# Patient Record
Sex: Female | Born: 1955 | Race: White | Hispanic: No | Marital: Married | Smoking: Former smoker
Health system: Southern US, Community
[De-identification: ages and names within clinical notes are randomized; demographics above are authoritative.]

## PROBLEM LIST (undated history)

## (undated) DIAGNOSIS — E785 Hyperlipidemia, unspecified: Secondary | ICD-10-CM

## (undated) DIAGNOSIS — G2581 Restless legs syndrome: Secondary | ICD-10-CM

## (undated) DIAGNOSIS — E039 Hypothyroidism, unspecified: Secondary | ICD-10-CM

## (undated) DIAGNOSIS — G629 Polyneuropathy, unspecified: Secondary | ICD-10-CM

## (undated) DIAGNOSIS — R0600 Dyspnea, unspecified: Secondary | ICD-10-CM

## (undated) DIAGNOSIS — I509 Heart failure, unspecified: Secondary | ICD-10-CM

## (undated) DIAGNOSIS — K219 Gastro-esophageal reflux disease without esophagitis: Secondary | ICD-10-CM

## (undated) DIAGNOSIS — I252 Old myocardial infarction: Secondary | ICD-10-CM

## (undated) DIAGNOSIS — T8859XA Other complications of anesthesia, initial encounter: Secondary | ICD-10-CM

## (undated) DIAGNOSIS — J3800 Paralysis of vocal cords and larynx, unspecified: Secondary | ICD-10-CM

## (undated) DIAGNOSIS — Z9981 Dependence on supplemental oxygen: Secondary | ICD-10-CM

## (undated) DIAGNOSIS — L0390 Cellulitis, unspecified: Secondary | ICD-10-CM

## (undated) DIAGNOSIS — Z8719 Personal history of other diseases of the digestive system: Secondary | ICD-10-CM

## (undated) DIAGNOSIS — E1140 Type 2 diabetes mellitus with diabetic neuropathy, unspecified: Secondary | ICD-10-CM

## (undated) DIAGNOSIS — Z992 Dependence on renal dialysis: Secondary | ICD-10-CM

## (undated) DIAGNOSIS — T4145XA Adverse effect of unspecified anesthetic, initial encounter: Secondary | ICD-10-CM

## (undated) DIAGNOSIS — N8500 Endometrial hyperplasia, unspecified: Secondary | ICD-10-CM

## (undated) DIAGNOSIS — Z8489 Family history of other specified conditions: Secondary | ICD-10-CM

## (undated) DIAGNOSIS — E119 Type 2 diabetes mellitus without complications: Secondary | ICD-10-CM

## (undated) DIAGNOSIS — M1990 Unspecified osteoarthritis, unspecified site: Secondary | ICD-10-CM

## (undated) DIAGNOSIS — F329 Major depressive disorder, single episode, unspecified: Secondary | ICD-10-CM

## (undated) DIAGNOSIS — I639 Cerebral infarction, unspecified: Secondary | ICD-10-CM

## (undated) DIAGNOSIS — I2510 Atherosclerotic heart disease of native coronary artery without angina pectoris: Secondary | ICD-10-CM

## (undated) DIAGNOSIS — J449 Chronic obstructive pulmonary disease, unspecified: Secondary | ICD-10-CM

## (undated) DIAGNOSIS — J4489 Other specified chronic obstructive pulmonary disease: Secondary | ICD-10-CM

## (undated) DIAGNOSIS — J42 Unspecified chronic bronchitis: Secondary | ICD-10-CM

## (undated) DIAGNOSIS — F419 Anxiety disorder, unspecified: Secondary | ICD-10-CM

## (undated) DIAGNOSIS — J45909 Unspecified asthma, uncomplicated: Secondary | ICD-10-CM

## (undated) DIAGNOSIS — I739 Peripheral vascular disease, unspecified: Secondary | ICD-10-CM

## (undated) DIAGNOSIS — F32A Depression, unspecified: Secondary | ICD-10-CM

## (undated) DIAGNOSIS — N186 End stage renal disease: Secondary | ICD-10-CM

## (undated) DIAGNOSIS — D649 Anemia, unspecified: Secondary | ICD-10-CM

## (undated) DIAGNOSIS — J189 Pneumonia, unspecified organism: Secondary | ICD-10-CM

## (undated) DIAGNOSIS — H3520 Other non-diabetic proliferative retinopathy, unspecified eye: Secondary | ICD-10-CM

## (undated) DIAGNOSIS — I10 Essential (primary) hypertension: Secondary | ICD-10-CM

## (undated) HISTORY — DX: Peripheral vascular disease, unspecified: I73.9

## (undated) HISTORY — DX: Other specified chronic obstructive pulmonary disease: J44.89

## (undated) HISTORY — PX: VITRECTOMY: SHX106

## (undated) HISTORY — DX: Essential (primary) hypertension: I10

## (undated) HISTORY — PX: INTRAUTERINE DEVICE INSERTION: SHX323

## (undated) HISTORY — PX: CERVICAL BIOPSY: SHX590

## (undated) HISTORY — DX: Chronic obstructive pulmonary disease, unspecified: J44.9

## (undated) HISTORY — PX: COLONOSCOPY: SHX174

## (undated) HISTORY — DX: Atherosclerotic heart disease of native coronary artery without angina pectoris: I25.10

## (undated) HISTORY — DX: Hyperlipidemia, unspecified: E78.5

## (undated) HISTORY — PX: EYE SURGERY: SHX253

## (undated) HISTORY — PX: CATARACT EXTRACTION W/ INTRAOCULAR LENS  IMPLANT, BILATERAL: SHX1307

## (undated) HISTORY — DX: Endometrial hyperplasia, unspecified: N85.00

## (undated) HISTORY — PX: CAROTID ENDARTERECTOMY: SUR193

---

## 1999-03-20 ENCOUNTER — Emergency Department (HOSPITAL_COMMUNITY): Admission: EM | Admit: 1999-03-20 | Discharge: 1999-03-20 | Payer: Self-pay | Admitting: Emergency Medicine

## 1999-09-07 ENCOUNTER — Emergency Department (HOSPITAL_COMMUNITY): Admission: EM | Admit: 1999-09-07 | Discharge: 1999-09-07 | Payer: Self-pay | Admitting: Emergency Medicine

## 1999-09-07 ENCOUNTER — Encounter: Payer: Self-pay | Admitting: Emergency Medicine

## 2000-02-24 ENCOUNTER — Encounter: Admission: RE | Admit: 2000-02-24 | Discharge: 2000-02-24 | Payer: Self-pay | Admitting: Family Medicine

## 2000-02-24 ENCOUNTER — Encounter: Payer: Self-pay | Admitting: Family Medicine

## 2001-05-17 ENCOUNTER — Other Ambulatory Visit: Admission: RE | Admit: 2001-05-17 | Discharge: 2001-05-17 | Payer: Self-pay | Admitting: Family Medicine

## 2001-05-19 ENCOUNTER — Encounter: Payer: Self-pay | Admitting: Family Medicine

## 2001-05-19 ENCOUNTER — Encounter: Admission: RE | Admit: 2001-05-19 | Discharge: 2001-05-19 | Payer: Self-pay | Admitting: Family Medicine

## 2001-05-20 ENCOUNTER — Encounter: Admission: RE | Admit: 2001-05-20 | Discharge: 2001-05-20 | Payer: Self-pay | Admitting: Family Medicine

## 2001-05-20 ENCOUNTER — Encounter: Payer: Self-pay | Admitting: Family Medicine

## 2001-06-17 ENCOUNTER — Encounter: Payer: Self-pay | Admitting: Vascular Surgery

## 2001-06-19 ENCOUNTER — Encounter (INDEPENDENT_AMBULATORY_CARE_PROVIDER_SITE_OTHER): Payer: Self-pay | Admitting: *Deleted

## 2001-06-19 ENCOUNTER — Inpatient Hospital Stay (HOSPITAL_COMMUNITY): Admission: RE | Admit: 2001-06-19 | Discharge: 2001-06-20 | Payer: Self-pay | Admitting: Vascular Surgery

## 2001-11-22 ENCOUNTER — Ambulatory Visit (HOSPITAL_COMMUNITY): Admission: RE | Admit: 2001-11-22 | Discharge: 2001-11-23 | Payer: Self-pay | Admitting: Ophthalmology

## 2001-11-22 ENCOUNTER — Encounter: Payer: Self-pay | Admitting: Ophthalmology

## 2002-06-05 ENCOUNTER — Other Ambulatory Visit: Admission: RE | Admit: 2002-06-05 | Discharge: 2002-06-05 | Payer: Self-pay | Admitting: Family Medicine

## 2003-07-10 ENCOUNTER — Other Ambulatory Visit: Admission: RE | Admit: 2003-07-10 | Discharge: 2003-07-10 | Payer: Self-pay | Admitting: Family Medicine

## 2005-04-06 ENCOUNTER — Other Ambulatory Visit: Admission: RE | Admit: 2005-04-06 | Discharge: 2005-04-06 | Payer: Self-pay | Admitting: Family Medicine

## 2005-05-01 ENCOUNTER — Encounter: Admission: RE | Admit: 2005-05-01 | Discharge: 2005-05-01 | Payer: Self-pay | Admitting: Family Medicine

## 2005-05-19 ENCOUNTER — Encounter: Admission: RE | Admit: 2005-05-19 | Discharge: 2005-05-19 | Payer: Self-pay | Admitting: Family Medicine

## 2005-12-29 ENCOUNTER — Encounter: Admission: RE | Admit: 2005-12-29 | Discharge: 2005-12-29 | Payer: Self-pay | Admitting: Family Medicine

## 2006-07-02 ENCOUNTER — Encounter: Admission: RE | Admit: 2006-07-02 | Discharge: 2006-07-02 | Payer: Self-pay | Admitting: Family Medicine

## 2006-07-09 ENCOUNTER — Other Ambulatory Visit: Admission: RE | Admit: 2006-07-09 | Discharge: 2006-07-09 | Payer: Self-pay | Admitting: Family Medicine

## 2006-08-13 ENCOUNTER — Encounter: Admission: RE | Admit: 2006-08-13 | Discharge: 2006-08-13 | Payer: Self-pay | Admitting: Family Medicine

## 2006-11-16 ENCOUNTER — Inpatient Hospital Stay (HOSPITAL_COMMUNITY): Admission: AD | Admit: 2006-11-16 | Discharge: 2006-11-24 | Payer: Self-pay | Admitting: General Surgery

## 2006-11-22 ENCOUNTER — Ambulatory Visit: Payer: Self-pay | Admitting: Vascular Surgery

## 2006-11-22 ENCOUNTER — Encounter: Payer: Self-pay | Admitting: Vascular Surgery

## 2007-11-15 ENCOUNTER — Other Ambulatory Visit: Admission: RE | Admit: 2007-11-15 | Discharge: 2007-11-15 | Payer: Self-pay | Admitting: Family Medicine

## 2009-03-09 ENCOUNTER — Encounter: Admission: RE | Admit: 2009-03-09 | Discharge: 2009-03-09 | Payer: Self-pay | Admitting: Otolaryngology

## 2009-12-14 ENCOUNTER — Encounter: Admission: RE | Admit: 2009-12-14 | Discharge: 2009-12-14 | Payer: Self-pay | Admitting: Geriatric Medicine

## 2010-01-18 ENCOUNTER — Other Ambulatory Visit: Admission: RE | Admit: 2010-01-18 | Discharge: 2010-01-18 | Payer: Self-pay | Admitting: Obstetrics and Gynecology

## 2010-01-27 ENCOUNTER — Ambulatory Visit (HOSPITAL_COMMUNITY): Admission: RE | Admit: 2010-01-27 | Discharge: 2010-01-27 | Payer: Self-pay | Admitting: Gastroenterology

## 2010-03-29 ENCOUNTER — Ambulatory Visit: Payer: Self-pay | Admitting: Vascular Surgery

## 2010-04-19 ENCOUNTER — Ambulatory Visit: Payer: Self-pay | Admitting: Surgery

## 2010-04-19 ENCOUNTER — Ambulatory Visit (HOSPITAL_COMMUNITY): Admission: RE | Admit: 2010-04-19 | Discharge: 2010-04-19 | Payer: Self-pay | Admitting: Surgery

## 2010-04-26 ENCOUNTER — Ambulatory Visit: Payer: Self-pay | Admitting: Vascular Surgery

## 2010-05-05 ENCOUNTER — Ambulatory Visit: Payer: Self-pay | Admitting: Internal Medicine

## 2010-05-05 ENCOUNTER — Encounter: Payer: Self-pay | Admitting: Vascular Surgery

## 2010-05-05 ENCOUNTER — Ambulatory Visit: Payer: Self-pay | Admitting: Cardiology

## 2010-05-05 ENCOUNTER — Inpatient Hospital Stay (HOSPITAL_COMMUNITY): Admission: RE | Admit: 2010-05-05 | Discharge: 2010-05-14 | Payer: Self-pay | Admitting: Vascular Surgery

## 2010-05-05 ENCOUNTER — Encounter (INDEPENDENT_AMBULATORY_CARE_PROVIDER_SITE_OTHER): Payer: Self-pay | Admitting: Internal Medicine

## 2010-05-24 ENCOUNTER — Ambulatory Visit: Payer: Self-pay | Admitting: Vascular Surgery

## 2010-05-31 ENCOUNTER — Ambulatory Visit: Payer: Self-pay | Admitting: Vascular Surgery

## 2010-05-31 DIAGNOSIS — J449 Chronic obstructive pulmonary disease, unspecified: Secondary | ICD-10-CM | POA: Insufficient documentation

## 2010-05-31 DIAGNOSIS — E1169 Type 2 diabetes mellitus with other specified complication: Secondary | ICD-10-CM

## 2010-05-31 DIAGNOSIS — I119 Hypertensive heart disease without heart failure: Secondary | ICD-10-CM

## 2010-05-31 DIAGNOSIS — E785 Hyperlipidemia, unspecified: Secondary | ICD-10-CM

## 2010-05-31 DIAGNOSIS — E1129 Type 2 diabetes mellitus with other diabetic kidney complication: Secondary | ICD-10-CM

## 2010-06-01 ENCOUNTER — Ambulatory Visit: Payer: Self-pay | Admitting: Cardiology

## 2010-06-01 DIAGNOSIS — I2510 Atherosclerotic heart disease of native coronary artery without angina pectoris: Secondary | ICD-10-CM

## 2010-08-03 ENCOUNTER — Ambulatory Visit: Payer: Self-pay | Admitting: Cardiology

## 2010-08-18 ENCOUNTER — Ambulatory Visit (HOSPITAL_COMMUNITY)
Admission: RE | Admit: 2010-08-18 | Discharge: 2010-08-18 | Payer: Self-pay | Source: Home / Self Care | Attending: Cardiology | Admitting: Cardiology

## 2010-08-18 ENCOUNTER — Ambulatory Visit: Payer: Self-pay

## 2010-08-18 ENCOUNTER — Encounter: Payer: Self-pay | Admitting: Cardiology

## 2010-08-22 ENCOUNTER — Emergency Department (HOSPITAL_COMMUNITY)
Admission: EM | Admit: 2010-08-22 | Discharge: 2010-08-22 | Payer: Self-pay | Source: Home / Self Care | Admitting: Emergency Medicine

## 2010-09-06 ENCOUNTER — Ambulatory Visit: Payer: Self-pay | Admitting: Vascular Surgery

## 2010-09-15 ENCOUNTER — Ambulatory Visit
Admission: RE | Admit: 2010-09-15 | Discharge: 2010-09-15 | Payer: Self-pay | Source: Home / Self Care | Attending: Cardiology | Admitting: Cardiology

## 2010-09-15 ENCOUNTER — Encounter: Payer: Self-pay | Admitting: Cardiology

## 2010-10-02 ENCOUNTER — Encounter: Payer: Self-pay | Admitting: Family Medicine

## 2010-10-11 NOTE — Assessment & Plan Note (Signed)
Summary: eph/diastolic chf/lg   CC:  fatigue.  History of Present Illness: This is a 55 year old white female patient who underwent left carotid endarterectomy May 05, 2010. Postop she developed breathing difficulty and was intubated and found to have positive cardiac enzymes and an EKG suggestive of a non-ST elevation MI. She underwent cardiac catheterization May 10, 2010 that showed a hyperdynamic LV function, she had dominant circumflex anatomy with a 70-80% small OM1. She had diffuse diabetic plaque particularly in the distal LAD. She nondominant RCA. Medical therapy was recommended at this time she was anemic and smoking cessation was recommended as well.  The patient denies any further chest pain, tightness, dyspnea, dyspnea on exertion, dizziness or presyncope. She has had 2 episodes of fluttering that lasted only a second and had no other associated symptoms. She states she may need a right carotid endarterectomy in near future. She also developed cellulitis and leg edema since discharge and was seen by her primary care physician. They added Lasix and an antibiotic which has helped.She has quit smoking.  Current Medications (verified): 1)  Bactroban 2 % Oint (Mupirocin) .... As Directed 2)  Furosemide 20 Mg Tabs (Furosemide) .... Take One Tablet By Mouth Daily. 3)  Doxycycline Hyclate 100 Mg Caps (Doxycycline Hyclate) .Marland Kitchen.. 1  Tab By Mouth Two Times A Day 4)  Benicar 20 Mg Tabs (Olmesartan Medoxomil) .... Take One Tablet By Mouth Daily 5)  Metoprolol Tartrate 25 Mg Tabs (Metoprolol Tartrate) .... Take One Tablet By Mouth Twice A Day 6)  Co Q-10 100 Mg Caps (Coenzyme Q10) .Marland Kitchen.. 1 Tab By Mouth Once Daily 7)  Apidra 100 Unit/ml Soln (Insulin Glulisine) .... Sliding Scale 8)  Lantus Solostar 100 Unit/ml Soln (Insulin Glargine) .... 80 Untis Daily 9)  Crestor 5 Mg Tabs (Rosuvastatin Calcium) .... Take One Tablet By Mouth Daily. 10)  Lyrica 75 Mg Caps (Pregabalin) .Marland Kitchen.. 1  Tab By Mouth At  Bedtime 11)  Aspirin 81 Mg Tbec (Aspirin) .... Take One Tablet By Mouth Daily 12)  Travatan Z 0.004 % Soln (Travoprost) .... As Directed 13)  Albuterol Sulfate .... As Directed  Past History:  Past Medical History: Last updated: 05/31/2010 Her past medical history is significant for diabetes, hypertension and   hyperlipidemia.  She also has COPD.  She has a peripheral neuropathy.   She has decreased hearing in the left ear due to a stroke.  She has   glaucoma.  She also has a history of peripheral vascular disease and has   had prior left carotid endarterectomy.  She has had previous eye   surgeries as well.  She has no known drug allergies.   Social History: Reviewed history from 05/31/2010 and no changes required. She is married.  She quit smoking.  She does not consume   alcohol.   Review of Systems       see history of present illness  Vital Signs:  Patient profile:   55 year old female Height:      64 inches Weight:      228 pounds BMI:     39.28 Pulse rate:   70 / minute Resp:     14 per minute BP sitting:   140 / 66  (left arm)  Vitals Entered By: Kem Parkinson (June 01, 2010 10:20 AM)  Physical Exam  General:   Well-nournished, in no acute distress. Neck: Scar healing well, bilateral carotid bruits,No JVD, HJR, or thyroid enlargement Lungs: No tachypnea, clear without wheezing, rales, or rhonchi  Cardiovascular: RRR, PMI not displaced, heart sounds normal, no murmurs, gallops, bruit, thrill, or heave. Abdomen: BS normal. Soft without organomegaly, masses, lesions or tenderness. Extremities:right groin without hematoma or hemorrhage, bilateral lower extremities have some open wounds from her cellulitis, but it looks like it is healing well. +1 edema bilaterally. Good distal pulses bilateral SKin: Warm, no lesions or rashes  Musculoskeletal: No deformities Neuro: no focal signs    Impression & Recommendations:  Problem # 1:  CAD, NATIVE VESSEL  (ICD-414.01) Patient had a non-ST elevation MI postop carotid endarterectomy May 05, 2010. Catheter revealed a 70-80% small OM one, and diffuse diabetic plaque particularly in the distal LAD. She is a dominant circumflex anatomy, and hyperdynamic LV function. The patient was anemic at time of catheter. Medical therapy was recommended. Patient has quit smoking which recommend her for. She denies any further chest pain. Her updated medication list for this problem includes:    Metoprolol Tartrate 25 Mg Tabs (Metoprolol tartrate) .Marland Kitchen... Take one tablet by mouth twice a day    Aspirin 81 Mg Tbec (Aspirin) .Marland Kitchen... Take one tablet by mouth daily  Problem # 2:  PVD (ICD-443.9) Patient had a left carotid endarterectomy and states she will need a right carotid done in about 3 months.  Problem # 3:  HYPERTENSION (ICD-401.9) Blood pressure better controlled. Her updated medication list for this problem includes:    Furosemide 20 Mg Tabs (Furosemide) .Marland Kitchen... Take one tablet by mouth daily.    Benicar 20 Mg Tabs (Olmesartan medoxomil) .Marland Kitchen... Take one tablet by mouth daily    Metoprolol Tartrate 25 Mg Tabs (Metoprolol tartrate) .Marland Kitchen... Take one tablet by mouth twice a day    Aspirin 81 Mg Tbec (Aspirin) .Marland Kitchen... Take one tablet by mouth daily  Problem # 4:  DM (ICD-250.00) Patient has significant problems from diabetic neuropathy. Her updated medication list for this problem includes:    Benicar 20 Mg Tabs (Olmesartan medoxomil) .Marland Kitchen... Take one tablet by mouth daily    Apidra 100 Unit/ml Soln (Insulin glulisine) ..... Sliding scale    Lantus Solostar 100 Unit/ml Soln (Insulin glargine) .Marland KitchenMarland KitchenMarland KitchenMarland Kitchen 80 untis daily    Aspirin 81 Mg Tbec (Aspirin) .Marland Kitchen... Take one tablet by mouth daily  Problem # 5:  EDEMA (ICD-782.3) Patient's edema has improved. We did give her prescription for TED hose.  Problem # 6:  TOBACCO ABUSE (ICD-305.1) Patient quit smoking and I commend her for this.  Patient Instructions: 1)  Your physician  recommends that you schedule a follow-up appointment in: 2 MONTHS WITH DR Riley Kill  2)  APPLY SUPPORT HOSE EVERY AM AND REMOVE EVERY PM 3)  Your physician recommends that you continue on your current medications as directed. Please refer to the Current Medication list given to you today. Prescriptions: METOPROLOL TARTRATE 25 MG TABS (METOPROLOL TARTRATE) Take one tablet by mouth twice a day  #60 x 11   Entered by:   Scherrie Bateman, LPN   Authorized by:   Marletta Lor, PA-C   Signed by:   Scherrie Bateman, LPN on 16/06/9603   Method used:   Electronically to        [facility] [phone]* (retail)       [address], [place]  [address]       Ph: [phone] or [phone]       Fax: [phone]   RxID:   [id number] BENICAR 20 MG TABS (OLMESARTAN MEDOXOMIL) Take one tablet  by mouth daily  #30 x 11   Entered by:   Scherrie Bateman, LPN   Authorized by:   Marletta Lor, PA-C   Signed by:   Scherrie Bateman, LPN on 78/29/5621   Method used:   Electronically to        [facility] [phone]* (retail)       [address]       [place], [place]  [address]       Ph: [phone] or [phone]       Fax: [phone]   RxID:   [id number]

## 2010-10-13 NOTE — Assessment & Plan Note (Signed)
Summary: 2 month rov.sl   Visit Type:  Follow-up   History of Present Illness: no cardiology complaints.  Getting along well. Saw Dr. Jens Som originally.  Denies ongoing symptoms.  Feels good overall.   Current Medications (verified): 1)  Bactroban 2 % Oint (Mupirocin) .... As Directed 2)  Furosemide 20 Mg Tabs (Furosemide) .... Take One Tablet By Mouth Daily. 3)  Benicar 20 Mg Tabs (Olmesartan Medoxomil) .... Take One Tablet By Mouth Daily 4)  Metoprolol Tartrate 25 Mg Tabs (Metoprolol Tartrate) .... Take One Tablet By Mouth Twice A Day 5)  Co Q-10 100 Mg Caps (Coenzyme Q10) .Marland Kitchen.. 1 Tab By Mouth Once Daily 6)  Apidra 100 Unit/ml Soln (Insulin Glulisine) .... Sliding Scale 7)  Lantus Solostar 100 Unit/ml Soln (Insulin Glargine) .... 80 Untis Daily 8)  Crestor 5 Mg Tabs (Rosuvastatin Calcium) .... Take One Tablet By Mouth Daily. 9)  Lyrica 75 Mg Caps (Pregabalin) .Marland Kitchen.. 1  Tab By Mouth At Bedtime 10)  Aspirin 81 Mg Tbec (Aspirin) .... Take One Tablet By Mouth Daily 11)  Travatan Z 0.004 % Soln (Travoprost) .... As Directed 12)  Albuterol Sulfate .... As Directed  Comments:  Nurse/Medical Assistant: patient brought med list stated all meds are correct on list  Vital Signs:  Patient profile:   55 year old female Weight:      243 pounds BMI:     41.86 Pulse rate:   70 / minute BP sitting:   134 / 78  (right arm)  Vitals Entered By: Dreama Saa, CNA (August 03, 2010 4:14 PM)  Physical Exam  General:  Well developed, well nourished, in no acute distress. Head:  normocephalic and atraumatic Eyes:  PERRLA/EOM intact; conjunctiva and lids normal. Lungs:  Clear bilaterally to auscultation and percussion. Heart:  PMI non displaced.  Normal S1 and S2. Abdomen:  Bowel sounds positive; abdomen soft and non-tender without masses, organomegaly, or hernias noted. No hepatosplenomegaly. Pulses:  pulses normal in all 4 extremities Neurologic:  Alert and oriented x 3.   Cardiac  Cath  Procedure date:  05/11/2009  Findings:      CONCLUSIONS: 1. Hyperdynamic LV function. 2. Dominant circumflex anatomy. 3. 70-80% small OM-1. 4. Diffuse diabetic plaque particularly in the distal LAD. 5. Nondominant right coronary.   RECOMMENDATIONS:  The patient is anemic at the present time, we would lean towards medical therapy.  Discontinuation of smoking would be highly important for her, going forward with her underlying diabetic disease.      Impression & Recommendations:  Problem # 1:  CAD, NATIVE VESSEL (ICD-414.01) Mld diabetic abnormalities as noted above. Diabetic CAD discussed in detail.   Her updated medication list for this problem includes:    Metoprolol Tartrate 25 Mg Tabs (Metoprolol tartrate) .Marland Kitchen... Take one tablet by mouth twice a day    Aspirin 81 Mg Tbec (Aspirin) .Marland Kitchen... Take one tablet by mouth daily  Problem # 2:  HYPERLIPIDEMIA (ICD-272.4) Will need to check success on this dose.  Her updated medication list for this problem includes:    Crestor 5 Mg Tabs (Rosuvastatin calcium) .Marland Kitchen... Take one tablet by mouth daily.  Other Orders: Echocardiogram (Echo)  Patient Instructions: 1)  Your physician recommends that you schedule a follow-up appointment in: 6 WEEKS 2)  Your physician has requested that you have an echocardiogram.  Echocardiography is a painless test that uses sound waves to create images of your heart. It provides your doctor with information about the size and shape of your heart  and how well your heart's chambers and valves are working.  This procedure takes approximately one hour. There are no restrictions for this procedure.

## 2010-10-13 NOTE — Assessment & Plan Note (Signed)
Summary: F6W/DM   History of Present Illness: 55 year old female I initially saw in August of 2011. Following carotid endarterectomy she went into pulmonary edema. Cardiac catheterization in August of 2011 revealed  and ejection fraction of 70%. The LAD had a 30% proximal and 70% distal lesion. It appeared diffusely diseased and diabetic appearing. There is a very small first marginal branch that has about 70-80% narrowing proximally. There is a tiny second marginal branch with about 50% narrowing.There is a small nondominant right coronary artery without critical narrowing. She has been treated medically. Echocardiogram was repeated in December of 2011 and revealed an ejection fraction of 60-65%. There was a dynamic obstruction of 2.3 m/s. There is mild mitral regurgitation and trace aortic insufficiency. Since she was last seen she denies dyspnea on exertion, orthopnea, PND, palpitations, syncope or chest pain. She has had some pedal edema which is improving.  Current Medications (verified): 1)  Bactroban 2 % Oint (Mupirocin) .... As Directed 2)  Furosemide 20 Mg Tabs (Furosemide) .... Take One Tablet By Mouth Daily. 3)  Benicar 20 Mg Tabs (Olmesartan Medoxomil) .... Take One Tablet By Mouth Daily 4)  Metoprolol Tartrate 25 Mg Tabs (Metoprolol Tartrate) .... Take One Tablet By Mouth Twice A Day 5)  Apidra 100 Unit/ml Soln (Insulin Glulisine) .... Sliding Scale 6)  Lantus Solostar 100 Unit/ml Soln (Insulin Glargine) .... 60 Untis Daily 7)  Crestor 5 Mg Tabs (Rosuvastatin Calcium) .... Take One Tablet By Mouth Daily. 8)  Lyrica 75 Mg Caps (Pregabalin) .Marland Kitchen.. 1  Tab By Mouth At Bedtime 9)  Aspirin 81 Mg Tbec (Aspirin) .... Take One Tablet By Mouth Daily 10)  Travatan Z 0.004 % Soln (Travoprost) .... As Directed 11)  Albuterol Sulfate .... As Directed  Past History:  Past Medical History: Her past medical history is significant for diabetes, hypertension and   hyperlipidemia.  She also has COPD.   She has a peripheral neuropathy.   She has decreased hearing in the left ear due to a stroke.  She has   glaucoma.  She also has a history of peripheral vascular disease and has   had prior left carotid endarterectomy.  She has had previous eye   surgeries as well.    Past Surgical History: Reviewed history from 05/31/2010 and no changes required.   Multiple eye surgeries x4 and a left-sided  carotid endarterectomy.  Social History: Reviewed history from 06/01/2010 and no changes required. She is married.  She quit smoking.  She does not consume   alcohol.   Review of Systems       Some pain in left shoulder from recent fall but no fevers or chills, productive cough, hemoptysis, dysphasia, odynophagia, melena, hematochezia, dysuria, hematuria, rash, seizure activity, orthopnea, PND,  claudication. Remaining systems are negative.   Vital Signs:  Patient profile:   55 year old female Height:      64 inches Weight:      238 pounds BMI:     41.00 Pulse rate:   79 / minute Resp:     14 per minute BP sitting:   140 / 80  (left arm)  Vitals Entered By: Kem Parkinson (September 15, 2010 3:50 PM)  Physical Exam  General:  Well-developed well-nourished in no acute distress.  Skin is warm and dry.  HEENT is normal.  Neck is supple. No thyromegaly.  Chest is clear to auscultation with normal expansion.  Cardiovascular exam is regular rate and rhythm.  Abdominal exam nontender or distended.  No masses palpated. Extremities show no edema. neuro grossly intact    EKG  Procedure date:  09/15/2010  Findings:      Sinus rhythm rate of 79. Prior anterior infarct. Lateral T-wave urgent.  Impression & Recommendations:  Problem # 1:  TOBACCO ABUSE (ICD-305.1) Now discontinued.  Problem # 2:  CAD, NATIVE VESSEL (ICD-414.01) Continue aspirin, beta blocker and statin. Her updated medication list for this problem includes:    Metoprolol Tartrate 25 Mg Tabs (Metoprolol tartrate)  .Marland Kitchen... Take one tablet by mouth twice a day    Aspirin 81 Mg Tbec (Aspirin) .Marland Kitchen... Take one tablet by mouth daily  Problem # 3:  HYPERLIPIDEMIA (ICD-272.4) Continue statin. Check lipids and liver. Her updated medication list for this problem includes:    Crestor 5 Mg Tabs (Rosuvastatin calcium) .Marland Kitchen... Take one tablet by mouth daily.  Problem # 4:  COPD (ICD-496)  Problem # 5:  PVD (ICD-443.9)  Continue aspirin and statin. Vascular surgery is following her carotid disease.  Problem # 6:  HYPERTENSION (ICD-401.9) Blood pressure control. Continue present medications. Check potassium and renal function. Her updated medication list for this problem includes:    Furosemide 20 Mg Tabs (Furosemide) .Marland Kitchen... Take one tablet by mouth daily.    Benicar 20 Mg Tabs (Olmesartan medoxomil) .Marland Kitchen... Take one tablet by mouth daily    Metoprolol Tartrate 25 Mg Tabs (Metoprolol tartrate) .Marland Kitchen... Take one tablet by mouth twice a day    Aspirin 81 Mg Tbec (Aspirin) .Marland Kitchen... Take one tablet by mouth daily  Problem # 7:  DM (ICD-250.00)  Her updated medication list for this problem includes:    Benicar 20 Mg Tabs (Olmesartan medoxomil) .Marland Kitchen... Take one tablet by mouth daily    Apidra 100 Unit/ml Soln (Insulin glulisine) ..... Sliding scale    Lantus Solostar 100 Unit/ml Soln (Insulin glargine) .Marland KitchenMarland KitchenMarland KitchenMarland Kitchen 60 untis daily    Aspirin 81 Mg Tbec (Aspirin) .Marland Kitchen... Take one tablet by mouth daily  Patient Instructions: 1)  Your physician recommends that you return for lab work [id number] FASTING 2)  Your physician wants you to follow-up in: 6 MONTHS   You will receive a reminder letter in the mail two months in advance. If you don't receive a letter, please call our office to schedule the follow-up appointment.

## 2010-11-22 LAB — GLUCOSE, CAPILLARY

## 2010-11-24 LAB — BASIC METABOLIC PANEL
CO2: 29 mEq/L (ref 19–32)
CO2: 32 mEq/L (ref 19–32)
Calcium: 8.7 mg/dL (ref 8.4–10.5)
Chloride: 102 mEq/L (ref 96–112)
Chloride: 98 mEq/L (ref 96–112)
Chloride: 99 mEq/L (ref 96–112)
Creatinine, Ser: 1.22 mg/dL — ABNORMAL HIGH (ref 0.4–1.2)
Creatinine, Ser: 1.24 mg/dL — ABNORMAL HIGH (ref 0.4–1.2)
GFR calc Af Amer: 54 mL/min — ABNORMAL LOW (ref >60)
GFR calc Af Amer: 55 mL/min — ABNORMAL LOW (ref >60)
Glucose, Bld: 246 mg/dL — ABNORMAL HIGH (ref 70–99)
Potassium: 4.4 mEq/L (ref 3.5–5.1)
Sodium: 134 mEq/L — ABNORMAL LOW (ref 135–145)
Sodium: 137 mEq/L (ref 135–145)

## 2010-11-24 LAB — GLUCOSE, CAPILLARY
Glucose-Capillary: 108 mg/dL — ABNORMAL HIGH (ref 70–99)
Glucose-Capillary: 208 mg/dL — ABNORMAL HIGH (ref 70–99)

## 2010-11-24 LAB — CBC
HCT: 28.3 % — ABNORMAL LOW (ref 36.0–46.0)
Hemoglobin: 9.2 g/dL — ABNORMAL LOW (ref 12.0–15.0)
Hemoglobin: 9.4 g/dL — ABNORMAL LOW (ref 12.0–15.0)
MCH: 29.1 pg (ref 26.0–34.0)
MCHC: 32.5 g/dL (ref 30.0–36.0)
MCV: 89.6 fL (ref 78.0–100.0)
Platelets: 182 10*3/uL (ref 150–400)
RBC: 3.18 MIL/uL — ABNORMAL LOW (ref 3.87–5.11)
WBC: 12.3 10*3/uL — ABNORMAL HIGH (ref 4.0–10.5)

## 2010-11-25 LAB — POCT I-STAT 3, ART BLOOD GAS (G3+)
Acid-Base Excess: 1 mmol/L (ref 0.0–2.0)
Acid-base deficit: 6 mmol/L — ABNORMAL HIGH (ref 0.0–2.0)
Acid-base deficit: 6 mmol/L — ABNORMAL HIGH (ref 0.0–2.0)
Bicarbonate: 20.1 mEq/L (ref 20.0–24.0)
Bicarbonate: 20.3 mEq/L (ref 20.0–24.0)
Bicarbonate: 23.5 mEq/L (ref 20.0–24.0)
O2 Saturation: 100 %
O2 Saturation: 95 %
Patient temperature: 98.8
TCO2: 22 mmol/L (ref 0–100)
TCO2: 24 mmol/L (ref 0–100)
pCO2 arterial: 43.6 mmHg (ref 35.0–45.0)
pH, Arterial: 7.272 — ABNORMAL LOW (ref 7.350–7.400)
pO2, Arterial: 149 mmHg — ABNORMAL HIGH (ref 80.0–100.0)
pO2, Arterial: 202 mmHg — ABNORMAL HIGH (ref 80.0–100.0)
pO2, Arterial: 86 mmHg (ref 80.0–100.0)

## 2010-11-25 LAB — BASIC METABOLIC PANEL
BUN: 24 mg/dL — ABNORMAL HIGH (ref 6–23)
BUN: 26 mg/dL — ABNORMAL HIGH (ref 6–23)
CO2: 24 mEq/L (ref 19–32)
CO2: 29 mEq/L (ref 19–32)
CO2: 29 mEq/L (ref 19–32)
Calcium: 8.3 mg/dL — ABNORMAL LOW (ref 8.4–10.5)
Calcium: 8.5 mg/dL (ref 8.4–10.5)
Calcium: 8.6 mg/dL (ref 8.4–10.5)
Chloride: 100 mEq/L (ref 96–112)
Chloride: 102 mEq/L (ref 96–112)
Chloride: 102 mEq/L (ref 96–112)
Chloride: 105 mEq/L (ref 96–112)
Chloride: 106 mEq/L (ref 96–112)
Creatinine, Ser: 1.12 mg/dL (ref 0.4–1.2)
GFR calc Af Amer: 57 mL/min — ABNORMAL LOW (ref >60)
GFR calc Af Amer: >60 mL/min (ref >60)
GFR calc Af Amer: >60 mL/min (ref >60)
GFR calc Af Amer: >60 mL/min (ref >60)
GFR calc non Af Amer: 49 mL/min — ABNORMAL LOW (ref >60)
GFR calc non Af Amer: 51 mL/min — ABNORMAL LOW (ref >60)
GFR calc non Af Amer: 52 mL/min — ABNORMAL LOW (ref >60)
Glucose, Bld: 139 mg/dL — ABNORMAL HIGH (ref 70–99)
Glucose, Bld: 217 mg/dL — ABNORMAL HIGH (ref 70–99)
Potassium: 3.5 mEq/L (ref 3.5–5.1)
Potassium: 3.7 mEq/L (ref 3.5–5.1)
Potassium: 3.9 mEq/L (ref 3.5–5.1)
Sodium: 136 mEq/L (ref 135–145)
Sodium: 137 mEq/L (ref 135–145)
Sodium: 137 mEq/L (ref 135–145)
Sodium: 137 mEq/L (ref 135–145)
Sodium: 139 mEq/L (ref 135–145)

## 2010-11-25 LAB — CBC
HCT: 24.8 % — ABNORMAL LOW (ref 36.0–46.0)
HCT: 26 % — ABNORMAL LOW (ref 36.0–46.0)
HCT: 26 % — ABNORMAL LOW (ref 36.0–46.0)
Hemoglobin: 11.7 g/dL — ABNORMAL LOW (ref 12.0–15.0)
Hemoglobin: 8.4 g/dL — ABNORMAL LOW (ref 12.0–15.0)
Hemoglobin: 8.7 g/dL — ABNORMAL LOW (ref 12.0–15.0)
Hemoglobin: 8.8 g/dL — ABNORMAL LOW (ref 12.0–15.0)
Hemoglobin: 9.3 g/dL — ABNORMAL LOW (ref 12.0–15.0)
Hemoglobin: 9.4 g/dL — ABNORMAL LOW (ref 12.0–15.0)
MCH: 29.2 pg (ref 26.0–34.0)
MCH: 29.2 pg (ref 26.0–34.0)
MCH: 29.2 pg (ref 26.0–34.0)
MCH: 29.3 pg (ref 26.0–34.0)
MCH: 30.1 pg (ref 26.0–34.0)
MCHC: 32.3 g/dL (ref 30.0–36.0)
MCHC: 33.5 g/dL (ref 30.0–36.0)
MCV: 87.5 fL (ref 78.0–100.0)
MCV: 90.3 fL (ref 78.0–100.0)
MCV: 92.5 fL (ref 78.0–100.0)
Platelets: 186 10*3/uL (ref 150–400)
RBC: 2.82 MIL/uL — ABNORMAL LOW (ref 3.87–5.11)
RBC: 2.97 MIL/uL — ABNORMAL LOW (ref 3.87–5.11)
RBC: 3.01 MIL/uL — ABNORMAL LOW (ref 3.87–5.11)
RBC: 3.18 MIL/uL — ABNORMAL LOW (ref 3.87–5.11)
RBC: 3.22 MIL/uL — ABNORMAL LOW (ref 3.87–5.11)
RBC: 3.89 MIL/uL (ref 3.87–5.11)
RDW: 14.1 % (ref 11.5–15.5)
WBC: 10.4 10*3/uL (ref 4.0–10.5)
WBC: 13 10*3/uL — ABNORMAL HIGH (ref 4.0–10.5)
WBC: 15 10*3/uL — ABNORMAL HIGH (ref 4.0–10.5)

## 2010-11-25 LAB — FERRITIN: Ferritin: 224 ng/mL (ref 10–291)

## 2010-11-25 LAB — GLUCOSE, CAPILLARY
Glucose-Capillary: 105 mg/dL — ABNORMAL HIGH (ref 70–99)
Glucose-Capillary: 121 mg/dL — ABNORMAL HIGH (ref 70–99)
Glucose-Capillary: 126 mg/dL — ABNORMAL HIGH (ref 70–99)
Glucose-Capillary: 132 mg/dL — ABNORMAL HIGH (ref 70–99)
Glucose-Capillary: 134 mg/dL — ABNORMAL HIGH (ref 70–99)
Glucose-Capillary: 146 mg/dL — ABNORMAL HIGH (ref 70–99)
Glucose-Capillary: 148 mg/dL — ABNORMAL HIGH (ref 70–99)
Glucose-Capillary: 155 mg/dL — ABNORMAL HIGH (ref 70–99)
Glucose-Capillary: 156 mg/dL — ABNORMAL HIGH (ref 70–99)
Glucose-Capillary: 162 mg/dL — ABNORMAL HIGH (ref 70–99)
Glucose-Capillary: 166 mg/dL — ABNORMAL HIGH (ref 70–99)
Glucose-Capillary: 171 mg/dL — ABNORMAL HIGH (ref 70–99)
Glucose-Capillary: 171 mg/dL — ABNORMAL HIGH (ref 70–99)
Glucose-Capillary: 173 mg/dL — ABNORMAL HIGH (ref 70–99)
Glucose-Capillary: 174 mg/dL — ABNORMAL HIGH (ref 70–99)
Glucose-Capillary: 174 mg/dL — ABNORMAL HIGH (ref 70–99)
Glucose-Capillary: 177 mg/dL — ABNORMAL HIGH (ref 70–99)
Glucose-Capillary: 178 mg/dL — ABNORMAL HIGH (ref 70–99)
Glucose-Capillary: 179 mg/dL — ABNORMAL HIGH (ref 70–99)
Glucose-Capillary: 183 mg/dL — ABNORMAL HIGH (ref 70–99)
Glucose-Capillary: 185 mg/dL — ABNORMAL HIGH (ref 70–99)
Glucose-Capillary: 190 mg/dL — ABNORMAL HIGH (ref 70–99)
Glucose-Capillary: 199 mg/dL — ABNORMAL HIGH (ref 70–99)
Glucose-Capillary: 204 mg/dL — ABNORMAL HIGH (ref 70–99)
Glucose-Capillary: 208 mg/dL — ABNORMAL HIGH (ref 70–99)
Glucose-Capillary: 239 mg/dL — ABNORMAL HIGH (ref 70–99)
Glucose-Capillary: 243 mg/dL — ABNORMAL HIGH (ref 70–99)
Glucose-Capillary: 261 mg/dL — ABNORMAL HIGH (ref 70–99)
Glucose-Capillary: 288 mg/dL — ABNORMAL HIGH (ref 70–99)
Glucose-Capillary: 337 mg/dL — ABNORMAL HIGH (ref 70–99)
Glucose-Capillary: 340 mg/dL — ABNORMAL HIGH (ref 70–99)

## 2010-11-25 LAB — TROPONIN I: Troponin I: 0.03 ng/mL (ref 0.00–0.06)

## 2010-11-25 LAB — CROSSMATCH

## 2010-11-25 LAB — POCT I-STAT 7, (LYTES, BLD GAS, ICA,H+H)
Acid-base deficit: 4 mmol/L — ABNORMAL HIGH (ref 0.0–2.0)
Acid-base deficit: 5 mmol/L — ABNORMAL HIGH (ref 0.0–2.0)
Bicarbonate: 24.2 mEq/L — ABNORMAL HIGH (ref 20.0–24.0)
Calcium, Ion: 1.15 mmol/L (ref 1.12–1.32)
Calcium, Ion: 1.16 mmol/L (ref 1.12–1.32)
Hemoglobin: 10.2 g/dL — ABNORMAL LOW (ref 12.0–15.0)
O2 Saturation: 85 %
O2 Saturation: 88 %
Potassium: 5.5 mEq/L — ABNORMAL HIGH (ref 3.5–5.1)
Sodium: 136 mEq/L (ref 135–145)
TCO2: 26 mmol/L (ref 0–100)
pO2, Arterial: 62 mmHg — ABNORMAL LOW (ref 80.0–100.0)
pO2, Arterial: 67 mmHg — ABNORMAL LOW (ref 80.0–100.0)

## 2010-11-25 LAB — HEPARIN LEVEL (UNFRACTIONATED)
Heparin Unfractionated: 0.26 IU/mL — ABNORMAL LOW (ref 0.30–0.70)
Heparin Unfractionated: 0.31 IU/mL (ref 0.30–0.70)
Heparin Unfractionated: 0.32 IU/mL (ref 0.30–0.70)
Heparin Unfractionated: 0.43 IU/mL (ref 0.30–0.70)

## 2010-11-25 LAB — CK TOTAL AND CKMB (NOT AT ARMC)
CK, MB: 36.3 ng/mL (ref 0.3–4.0)
Relative Index: 1.8 (ref 0.0–2.5)

## 2010-11-25 LAB — COMPREHENSIVE METABOLIC PANEL
ALT: 20 U/L (ref 0–35)
ALT: 34 U/L (ref 0–35)
AST: 25 U/L (ref 0–37)
Albumin: 2.7 g/dL — ABNORMAL LOW (ref 3.5–5.2)
Alkaline Phosphatase: 106 U/L (ref 39–117)
Alkaline Phosphatase: 87 U/L (ref 39–117)
CO2: 25 mEq/L (ref 19–32)
Calcium: 8.1 mg/dL — ABNORMAL LOW (ref 8.4–10.5)
Chloride: 101 mEq/L (ref 96–112)
GFR calc Af Amer: 54 mL/min — ABNORMAL LOW (ref >60)
GFR calc non Af Amer: 45 mL/min — ABNORMAL LOW (ref >60)
Potassium: 5.6 mEq/L — ABNORMAL HIGH (ref 3.5–5.1)
Sodium: 132 mEq/L — ABNORMAL LOW (ref 135–145)
Sodium: 136 mEq/L (ref 135–145)
Total Bilirubin: 0.3 mg/dL (ref 0.3–1.2)
Total Protein: 5.8 g/dL — ABNORMAL LOW (ref 6.0–8.3)

## 2010-11-25 LAB — FOLATE: Folate: 7.1 ng/mL

## 2010-11-25 LAB — POCT I-STAT, CHEM 8
Glucose, Bld: 239 mg/dL — ABNORMAL HIGH (ref 70–99)
HCT: 38 % (ref 36.0–46.0)
Hemoglobin: 12.9 g/dL (ref 12.0–15.0)
Potassium: 5.2 mEq/L — ABNORMAL HIGH (ref 3.5–5.1)
Sodium: 138 mEq/L (ref 135–145)
TCO2: 26 mmol/L (ref 0–100)

## 2010-11-25 LAB — PHOSPHORUS
Phosphorus: 3.7 mg/dL (ref 2.3–4.6)
Phosphorus: 4.6 mg/dL (ref 2.3–4.6)

## 2010-11-25 LAB — TYPE AND SCREEN: ABO/RH(D): O POS

## 2010-11-25 LAB — RETICULOCYTES: Retic Ct Pct: 1.6 % (ref 0.4–3.1)

## 2010-11-25 LAB — URINALYSIS, ROUTINE W REFLEX MICROSCOPIC
Glucose, UA: NEGATIVE mg/dL
Hgb urine dipstick: NEGATIVE
Specific Gravity, Urine: 1.01 (ref 1.005–1.030)

## 2010-11-25 LAB — HEMOGLOBIN AND HEMATOCRIT, BLOOD
HCT: 28.6 % — ABNORMAL LOW (ref 36.0–46.0)
Hemoglobin: 9.5 g/dL — ABNORMAL LOW (ref 12.0–15.0)

## 2010-11-25 LAB — CARDIAC PANEL(CRET KIN+CKTOT+MB+TROPI)
Relative Index: 1 (ref 0.0–2.5)
Relative Index: 2.2 (ref 0.0–2.5)
Total CK: 3036 U/L — ABNORMAL HIGH (ref 7–177)
Troponin I: 0.01 ng/mL (ref 0.00–0.06)
Troponin I: 0.27 ng/mL — ABNORMAL HIGH (ref 0.00–0.06)
Troponin I: 0.39 ng/mL — ABNORMAL HIGH (ref 0.00–0.06)

## 2010-11-25 LAB — URINE MICROSCOPIC-ADD ON

## 2010-11-25 LAB — CULTURE, RESPIRATORY W GRAM STAIN

## 2010-11-25 LAB — BLOOD GAS, ARTERIAL
Acid-base deficit: 4.2 mmol/L — ABNORMAL HIGH (ref 0.0–2.0)
Drawn by: 32526
O2 Content: 8 L/min
Patient temperature: 98.6
pCO2 arterial: 53.6 mmHg — ABNORMAL HIGH (ref 35.0–45.0)

## 2010-11-25 LAB — IRON AND TIBC: UIBC: 158 ug/dL

## 2010-11-25 LAB — MRSA PCR SCREENING: MRSA by PCR: NEGATIVE

## 2010-11-28 LAB — GLUCOSE, CAPILLARY: Glucose-Capillary: 201 mg/dL — ABNORMAL HIGH (ref 70–99)

## 2011-01-24 NOTE — Procedures (Signed)
CAROTID DUPLEX EXAM   INDICATION:  Follow up known carotid artery disease.   HISTORY:  Diabetes:  Yes.  Cardiac:  No.  Hypertension:  Yes.  Smoking:  Yes.  Previous Surgery:  Left carotid endarterectomy.  CV History:  CVA, loss of hearing.  Amaurosis Fugax No, Paresthesias No, Hemiparesis No.                                       RIGHT             LEFT  Brachial systolic pressure:         144               Not taken, per  patient  Brachial Doppler waveforms:         WNL  Vertebral direction of flow:        Antegrade         Antegrade  DUPLEX VELOCITIES (cm/sec)  CCA peak systolic                   170               106 mid, 241  distal  ECA peak systolic                   375               321  ICA peak systolic                   357               Mid 505, proximal  337  ICA end diastolic                   101               Mid 177, distal 79  PLAQUE MORPHOLOGY:                  Heterogenous      Heterogenous  PLAQUE AMOUNT:                      Moderate-to-severe                  Severe  PLAQUE LOCATION:                    ICA, ECA, bulb    ICA, ECA   IMPRESSION:  1. Right internal carotid artery suggests 60% to 79% stenosis.  2. Left internal carotid artery suggests 80% to 99% stenosis.  3. Antegrade flow in bilateral vertebrals.  4. Bilateral external carotid artery stenosis.   ___________________________________________  [name], M.D.   CB/MEDQ  D:  03/29/2010  T:  03/29/2010  Job:  [id number]

## 2011-01-24 NOTE — Assessment & Plan Note (Signed)
OFFICE VISIT   Kimberly, [name]  DOB:  Mar 03, 1956                                       05/31/2010  CHART#:[id number]   Kimberly [name] returns status post redo left carotid endarterectomy performed  August 25 for severe recurrent left internal carotid stenosis having  previously performed January 4.  She also has a moderately severe right  internal carotid stenosis which is asymptomatic.  She had an uneventful  operative procedure although the lesion was very high in the neck and  difficult technically to perform.  Postoperatively she developed  respiratory distress and required intubation for 24 hours.  Since her  discharge from hospital, she has been having problems with hoarseness  and dysphagia and has been evaluated by Dr. 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  She has had slow improvement in her swallowing and handles pureed type  food better than liquids at the present time.  She has also had some  swelling in her lower extremity which has required antibiotic treatment  and diuretics by Dr. Pete Glatter.  That seems to be improving.  She has no  hemiparesis, aphasia or unilateral blindness.   PHYSICAL EXAMINATION:  Blood pressure is 109/63, heart rate 79,  temperature 97.9.  Neurologic exam reveals some mild deviation of the  tongue to the left as well as a mild marginal mandibular nerve paresis  on the left.  She does have hoarseness.  Otherwise, neurologically she  is normal.  Her carotid pulses are 3+ with no bruits on the left, soft  bruit on the right.  Lower extremities have 1 to 2+ edema bilaterally  with some mild erythema.   She has no hoarseness and dysphagia which I think will improve with  time.  We discussed these potential problems preoperatively because of  the redo status and the high nature of the lesion.  She will continue  her therapy for these problems and follow up with Dr. Pollyann Kennedy and  return  to see me in 3 months.  She will return in 6 months for a carotid duplex  exam as well and further follow-up.  If she should require right carotid  repair in the future, she will certainly be treated with stenting which  seems much more minimal on a cerebral angiogram than the left side did.     [name], M.D.  Electronically Signed   JDL/MEDQ  D:  05/31/2010  T:  06/01/2010  Job:  [id number]

## 2011-01-24 NOTE — Procedures (Signed)
CAROTID DUPLEX EXAM   INDICATION:  Follow up left carotid endarterectomy.   HISTORY:  Diabetes:  Yes.  Cardiac:  No.  Hypertension:  Yes.  Smoking:  No.  Previous Surgery:  Left carotid endarterectomy in 2002 and 05/05/2010.  CV History:  Currently asymptomatic.  Amaurosis Fugax No, Paresthesias No, Hemiparesis No.                                       RIGHT             LEFT  Brachial systolic pressure:  Brachial Doppler waveforms:  Vertebral direction of flow:                          Antegrade  DUPLEX VELOCITIES (cm/sec)  CCA peak systolic                                     M = 135/D = 211  ECA peak systolic                                     61  ICA peak systolic                                     212  ICA end diastolic                                     56  PLAQUE MORPHOLOGY:                                    Heterogenous  PLAQUE AMOUNT:                                        Mild  PLAQUE LOCATION:                                      CCA   IMPRESSION:  1. Patent left carotid endarterectomy site with elevated velocities      noted in the left distal common carotid and proximal internal      carotid arteries, which appear to be due to a change in vessel      diameter.  2. Unable to examine the right carotid system due to sling location      from a recent left arm fracture.   ___________________________________________  [name], M.D.   CH/MEDQ  D:  09/06/2010  T:  09/06/2010  Job:  [id number]

## 2011-01-24 NOTE — Consult Note (Signed)
NEW PATIENT CONSULTATION   Kimberly, [name]  DOB:  09/21/1955                                       03/29/2010  CHART#:[id number]   This is a 55 year old female patient, well-known to me having previously  performed a left carotid endarterectomy on her in 2002.  It was severe  with asymptomatic left internal carotid stenosis.  I have not seen her  since she did not return for any followup carotid duplex studies.  She  was found to have carotid bruits by Dr. Pete Glatter and carotid duplex  exam performed, which I have reviewed.  This reviewed recurrent severe  stenosis in the left internal carotid and moderately severe right  internal carotid stenosis.  She denies any stroke or TIAs, hemispheric  or otherwise.  She has had no hemiparesis, aphasia, or amaurosis fugax.  She does have severe diabetic neuropathy with numbness in both lower  extremities below the knee and in her upper extremities to some degree.   CHRONIC MEDICAL PROBLEMS:  1. Diabetes mellitus type 1.  2. Hypertension.  3. Hyperlipidemia.  4. COPD.  5. Peripheral neuropathy.  6. 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  7. Negative for coronary artery disease.   FAMILY HISTORY:  Positive for stroke and coronary artery disease in her  father, and diabetes in multiple family members.   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  Smokes a pack of cigarettes per day  and has continued this since her previous vascular surgery.  Does not  use alcohol.   REVIEW OF SYSTEMS:  Positive for dyspnea on exertion, bronchitis,  asthma, hiatal hernia, lower extremity numbness due to neuropathy, joint  pain, decreased hearing.  All other systems are negative in the review  of systems.   PHYSICAL EXAMINATION:  Blood pressure 143/75 on the right, 103/66 on the  left.  Heart rate 90.  Respirations 14.  Generally, she is an obese,  well-nourished female who  is in no apparent distress.  She is alert and  oriented x3.  HEENT exam:  EOMs intact, otherwise normal.  Neck is  supple with 3+ carotid pulses.  There are harsh bruits bilaterally.  Lungs are clear to auscultation.  No rhonchi or wheezing.  Cardiovascular exam:  Regular rhythm, no murmurs.  Abdomen is obese; no  palpable masses.  Musculoskeletal exam is free of major deformities.  Neurologic exam reveals diffuse decrease in sensation in both lower  extremities.  She does have 3+ femoral pulses bilaterally with  adequately perfused lower extremities.   Today, I ordered a carotid duplex exam in our office; compared with the  other study.  I have reviewed this and interpreted it, and I believe  that she does have an 80% to 95% recurrent left internal carotid  stenosis and a probable 80% right internal carotid stenosis.   We will have to evaluate this with cerebral angiography to see if she is  a candidate for endovascular stenting on the left versus redo carotid  surgery and she will need a right carotid endarterectomy as well.  I  have scheduled that for August 9th to be done by Dr. Myra Gianotti at [facility].     Quita Skye. Hart Rochester, M.D.  Electronically Signed   JDL/MEDQ  D:  03/29/2010  T:  03/30/2010  Job:  [id number]   cc:   [name] T. Stoneking, M.D.

## 2011-01-24 NOTE — Assessment & Plan Note (Signed)
OFFICE VISIT   Kimberly [name], Kimberly [name]  DOB:  09-01-1956                                       04/26/2010  CHART#:[id number]   The patient returns to discuss her recently performed cerebral angiogram  performed by Dr. Myra Gianotti for recurrent severe left internal carotid  stenosis.  She continues to deny any neurologic symptoms such as  hemiparesis, aphasia, amaurosis fugax, diplopia, blurred vision or  syncope but has lost hearing in her left ear from a stroke noted by  the ENT doctors.  She had no chest pain, dyspnea on exertion, PND,  orthopnea.  She tolerated the angiogram well.   PHYSICAL EXAMINATION:  Today, her blood pressure is 133/68, heart rate  87, temperature 97.9, respirations 14.  Carotid pulses are 3+ with a  high-pitched bruit on the left, soft bruit on the right.  Neurologic:  Normal except for the hearing loss in the left ear.   Today, I reviewed her cerebral angiograms and discussed this with her at  length.  She does have recurrent 90% left internal carotid stenosis at  the distal end of the patch with irregularity secondary to recurrent  atherosclerosis.  She also has an approximately 75% to 80% concentric  right internal carotid stenosis.   I discussed the options of redo surgery versus stenting.  She is 55  years old, and I think the best plan would be a redo carotid surgery,  and she agrees with that plan and recommendation and would like to  proceed.  We will schedule that for next Thursday, August 25th at [facility].  Risks and benefits were discussed including peripheral nerve  injury, stroke and cardiac issues.  She would like to proceed.     Kimberly [name], M.D.  Electronically Signed   JDL/MEDQ  D:  04/26/2010  T:  04/26/2010  Job:  [id number]

## 2011-01-27 NOTE — Discharge Summary (Signed)
[place]. [facility]  Patient:    Kimberly [name], Kimberly [name] Visit Number: [id number] MRN: [id number]          Service Type: SUR Location: [id number] Attending Physician:  Colvin Caroli Dictated by:   Loura Pardon, P.A. Admit Date:  06/19/2001 Discharge Date: 06/20/2001   CC:         Quita Skye. Artis Flock, M.D.  [name], M.D.   Discharge Summary  DATE OF BIRTH:  Oct 07, 1955  DISCHARGE DIAGNOSES: 1. Asymptomatic extracranial cerebrovascular occlusive disease with high-grade    left internal carotid artery stenosis. 2. Type 2 diabetes mellitus, insulin-dependent. 3. Retinopathy. 4. History of retinal hemorrhage. 5. History of labyrinthitis. 6. Cardiomegaly. 7. 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 8. History of long-term, current tobacco habituation. 9. Pneumonia in 2001.  PROCEDURE:  On June 19, 2001, left carotid endarterectomy with Dacron patch angioplasty and intraoperative shunting by Dr. Josephina Gip.  The patient tolerated the procedure well and awoke in the recovery room with baseline neurologic status.  No neurologic deficits were noted.  Good swallow moving upper and lower extremities appropriately and maintained this baseline neurologic status throughout her postoperative hospitalization which lasted one day.  DISPOSITION:  Kimberly [name] went home on October 10, postop day #1, after undergoing left carotid endarterectomy.  Her mental status remained clear in the postoperative period.  She did not require any supplemental oxygen.  She had mild nausea postoperatively which resolved after given Phenergan.  Her pain medications were minimized such that they were not narcotic in nature. She was given Ultram.  At the time of discharge, she had eaten breakfast.  She was ambulating independently.  She had good GI tract function.  Her incision was healing nicely.  There was no evidence of swelling, drainage or erythema. She had  remained afebrile in the postoperative period.  Her laboratories were within normal limits except for potassium which was 3.0.  This was replenished with potassium chloride elixir before her discharge.  DISCHARGE MEDICATIONS: 1. Vicodin 5/500 one to two tablets p.o. q.4-6h. p.r.n. pain. 2. Altace 10 mg daily. 3. Actos 45 mg daily. 4. Amaryl 4 mg daily. 5. Lipitor 10 mg daily. 6. Lantus 25 units daily. 7. Lumigan ophthalmic solution one drop both eyes at bedtime.  ACTIVITY:  Ambulation to keep up strength.  She is asked not to drive until she sees Dr. Hart Rochester in the office for followup.  DIET:  Low sodium, low cholesterol, ADA diet.  SPECIAL INSTRUCTIONS:  She may shower beginning Saturday, October 12.  She is asked to sponge bathe until then.  FOLLOWUP:  She has a followup visit with Dr. Hart Rochester on Tuesday, July 02, 2001, at 3:40 p.m.  HISTORY OF PRESENT ILLNESS:  Kimberly [name] is a 55 year old female referred by Dr. Bradd Canary for evaluation of carotid artery disease.  The patient was undergoing a routine physical exam when a carotid bruit was auscultated. Carotid Dopplers were obtained at [facility] on September 9.  The study demonstrated a 70-79% left internal carotid artery stenosis with only mild right internal carotid artery stenosis.  Repeat studies were performed at the offices of [facility] and indeed she did have a high-grade left internal carotid artery stenosis with about 80%.  Options were discussed with the patient by Dr. Hart Rochester.  The patient opted for surgical repair scheduled for October 9.  The patient has not relayed any prior history of headache, nausea, vomiting or vertigo.  She has no episodes of dizziness, no falls and no history of seizures with no unilateral muscle weakness.  There is no speech impairment and no vision disturbances.  She has no history of syncope or presyncope with no memory loss or confusion.  HOSPITAL  COURSE:  Hospital course is as described in discharge disposition. Kimberly [name] was discharged on postop day #1, in stable, satisfactory condition. The carotid endarterectomy had remained patent.  The wound was healing nicely. The patient was ambulating and her mental status was clear.  She had not experienced any neurologic deficits. Dictated by:   Loura Pardon, P.A.  Attending Physician:  Colvin Caroli DD:  06/20/01 TD:  06/21/01 Job: [id number]

## 2011-01-27 NOTE — H&P (Signed)
NAME:  Kimberly [name], Kimberly [name]                 ACCOUNT NO.:  [id number]   MEDICAL RECORD NO.:  [id number]          PATIENT TYPE:  INP   LOCATION:  5704                         FACILITY:  [facility]   PHYSICIAN:  Della Goo, M.D. DATE OF BIRTH:  September 21, 1955   DATE OF ADMISSION:  11/16/2006  DATE OF DISCHARGE:                              HISTORY & PHYSICAL   CHIEF COMPLAINT:  Redness and pain right lower abdomen.   PRIMARY CARE PHYSICIAN:  Dr. Penni Bombard.   HISTORY OF PRESENT ILLNESS:  This is a 55 year old female with type 2  diabetes who describes redness, pain and swelling in the lower abdomen  for what was noticed as 5 days.  The patient was seen by her primary  care physician, Dr. Artis Flock, secondary to her symptoms and fevers and Dr.  Artis Flock referred the patient to the hospital for further evaluation and  surgical evaluation.  The patient was seen by Dr. Abbey Chatters and the  diagnosis was made of a panniculitis.  A CT scan of the abdomen was  ordered, results of which did reveal a panniculus formation and small  reactive lymph nodes around the area.  The patient was started on IV  antibiotic therapy for this.  The patient denies having any chest pain,  shortness of breath, weakness, dizziness.  She does report having fevers  and chills and pain at the site which has been extending from the right  lower quadrant area over across to the left side now.   PAST MEDICAL HISTORY:  1. For type 2 diabetes.  2. Hypertension.  3. Glaucoma.  4. Cardiomyopathy.  5. Peripheral vascular disease.  6. Diabetic neuropathy.   PAST SURGICAL HISTORY:  Multiple eye surgeries x4 and a left-sided  carotid endarterectomy.   MEDICATIONS:  Include Lasix insulin 35 units subcutaneous q.a.m. and  sliding scale coverage with a Apidra insulin 6 units with meals,  Travatan ophthalmic drops 1 drop both eyes nightly, Alphagan eye drop  into the right eye 1 drop b.i.d., Cosopt eyedrops into the right eye 1  drop b.i.d.,  Lyrica 75 mg one p.o. nightly.  The patient reports that  she is on a blood pressure medication but she does not recall what that  is and has stopped taking this.   ALLERGIES:  No known drug allergies.   SOCIAL HISTORY:  The patient does work at a bank in the business  section.  She is married.  Smokes cigarettes a half a pack per day for  about 30 years and has a glass of wine rarely.   FAMILY HISTORY:  Positive for strong history of diabetes in her maternal  family, her mother and her father.  Positive also for cancer in her  mother who had both non-Hodgkin's lymphoma and breast cancer.  Also  father had coronary artery disease.  No history of hypertension in her  family that she knows of.   REVIEW OF SYSTEMS:  Pertinent for mentioned above.   PHYSICAL EXAMINATION FINDINGS:  This is a 55 year old obese female who  is in no discomfort or acute distress currently.  On admission,  her  vital signs were a temperature of 100.81, blood pressure 190/79, heart  rate 97, respirations 20, O2 saturations 96% on room air.  SKIN EXAMINATION:  No icterus.  Skin is warm and dry.  There is calor at  the area of the right lower quadrant.  There are no open wounds in that  area.  HEENT:  Normocephalic, atraumatic.  There is no scleral icterus.  Pupils  are equally round and reactive to light.  Extraocular muscles are  intact.  Funduscopic benign.  Oropharynx is clear.  Neck is supple, full  range of motion.  No thyromegaly or adenopathy.  CARDIOVASCULAR:  Regular rate and rhythm.  LUNGS:  Clear to auscultation bilaterally.  ABDOMEN:  Positive bowel sounds, soft.  Tenderness in the right lower  quadrant which is now extending toward the medial abdominal area, lower  abdominal area.  This area has slight induration at the lower fold area.  There is no rebound or guarding and there is no hepatosplenomegaly.  GENITOURINARY:  Deferred.  RECTAL:  Deferred.  NEUROLOGIC:  Examination is grossly  intact.   CT of the abdomen and pelvis findings as mentioned above.   ASSESSMENT:  27. A 55 year old female with type 2 diabetes mellitus who has been      admitted with a panniculus formation. She has other diagnoses.  2. Type 2 diabetes mellitus.  3. Hypertension.  4. Glaucoma.   PLAN:  The patient will continue on IV antibiotic therapy of Zosyn.  Vancomycin has been added and pharmacy will further dose the vancomycin  therapy.  The patient will continue on her regular medications at this  time.  Laboratory studies have been ordered for the a.m. and her white  blood cell count will also be monitored.  The patient has been placed on  sliding scale insulin coverage along with DVT and GI prophylaxis.      Della Goo, M.D.  Electronically Signed     HJ/MEDQ  D:  11/17/2006  T:  11/17/2006  Job:  [id number]   cc:   Adolph Pollack, M.D.

## 2011-01-27 NOTE — Discharge Summary (Signed)
[place]. [facility]  Patient:    Kimberly [name], Kimberly [name] Visit Number: [id number] MRN: [id number]          Service Type: DSU Location: 4700 [id number] Attending Physician:  Ivor Messier Dictated by:   [name], M.D. Admit Date:  11/22/2001 Discharge Date: 11/23/2001   CC:         Chucky May, M.D.  Madaline Brilliant, M.D. (with Dr. Hazle Quant)  Quita Skye. Artis Flock, M.D.  Stanley C. Andrey Campanile, M.D.  [name], M.D.   Discharge Summary  DISCHARGE SUMMARY:  This was an urgent outpatient admission of this 55 year old white female, insulin-dependent diabetic, admitted for a sudden loss of vision in the right eye due to vitreous and preretinal hemorrhage from proliferative diabetic retinopathy in her right eye.  (See detailed Admission History and Physical).  HOSPITAL COURSE:  The patient was noted to be in satisfactory condition for the proposed surgery.  Blood sugar on the morning of surgery was satisfactory although the patient had a past history of somewhat unstable blood sugar control.  The patient, therefore, was taken into the operating room where a posterior vitrectomy was performed under general anesthesia without complication.  Membrane peeling was necessary to remove the fine membrane covering the macular area and preretinal hemorrhage.  Panretinal laser photocoagulation was applied with 1038 burns in a scattered technique.  Optic nerve neovascularization was noted as the probable cause with proliferative diabetic retinopathy of her sudden loss of vision and preretinal and vitreous hemorrhage.  Patient was taken to the recovery room and subsequently to the 23-hour observation unit.  During the postoperative period, the patients blood sugar remained high up to 375.  The patients condition, although clinically stable, was cause for some concern due to the elevated blood sugar. Dr. Karma Ganja, who was covering for Dr. Artis Flock, was notified.  The  patient was seen by Dr. Andrey Campanile on the evening or morning of the following day and felt to be type II diabetic, obese diabetic.  Blood sugars were controlled with supplemental regular insulin in addition to her Lantus insulin and oral medications.  By the following morning, the blood sugars had decreased to approximately 157, which was good for the patient, according to her husband and the patient.  The patient was, therefore, discharged home to be followed in the office.  Patient was given a printed list of discharge instructions on the care and use of the operated eye.  Patient was encouraged to monitor her blood sugar control and continue her usual medications under Dr. Kevan Rosebush and Dr. Julien Nordmann care.  DISCHARGE EYE CARE MEDICATIONS:  Tobradex and Cyclomydril ophthalmic solutions one drop four times a day five minutes apart and Maxitrol and atropine ointment at bedtime.  PHYSICAL EXAMINATION:  At the time of discharge, the patient stated that she no longer had the central scotoma which was occluding her vision and was pleased with her response to the surgery.  Examination revealed a still slightly hazy vitreous.  The retina was attached. There was no preretinal hemorrhage present.  Extensive panretinal laser photocoagulation burns could be seen.  FOLLOWUP:  Patient is to return to the office in five to seven days.  CONDITION ON DISCHARGE:  Improved.  DISCHARGE DIAGNOSES: 1. Advanced proliferative diabetic retinopathy with vitreous and preretinal    hemorrhage, right eye. 2. Insulin-dependent diabetes with poor blood sugar control.  SECONDARY DIAGNOSES:  History of carotid artery insufficiency with carotid endarterectomy by Dr. Hart Rochester.  Patient to continue on  baby aspirin per day. Dictated by:   [name], M.D. Attending Physician:  Ivor Messier DD:  11/24/01 TD:  11/25/01 Job: [id number]

## 2011-01-27 NOTE — Op Note (Signed)
[place]. [facility]  Patient:    Kimberly [name], Kimberly [name] Visit Number: [id number] MRN: [id number]          Service Type: DSU Location: 4700 [id number] Attending Physician:  Ivor Messier Dictated by:   [name], M.D. Proc. Date: 11/22/01 Admit Date:  11/22/2001 Discharge Date: 11/23/2001                             Operative Report  PREOPERATIVE DIAGNOSIS:  Proliferative diabetic retinopathy with vitreous and preretinal hemorrhage, right eye.  POSTOPERATIVE DIAGNOSIS:  Proliferative diabetic retinopathy with vitreous and preretinal hemorrhage, right eye.  OPERATION:  Posterior vitrectomy through pars plana, using vitreous infusion, suction cutter, panretinal endolaser photocoagulation, membrane peeling, and excision.  SURGEON:  [name], M.D.  ASSISTANT:  Nurse.  ANESTHESIA:  General.  Ophthalmoscopy as previously described.  OPERATIVE PROCEDURE:  As the patient was prepped and draped, a lid speculum was inserted in the right eye.  A peritomy was performed adjacent to the limbus from the 7 to 3 oclock position superiorly.  This subconjunctival tissue was clean and three sclerotomy sites repaired, 3.5 mm from the limbus using an MVR blade.  A 4 mm vitreous infusion terminal was secured in place at the 8 oclock position with a 5-0 white mattress Dacron suture.  The fiberoptic light pipe was inserted at the 2 oclock position and a hand piece the vitreous infusion suction cutter at the 10 oclock position.  Slow vitreous infusion suction cutting were begun from an anterior to posterior direction.  Mild vitreous hemorrhage was noted and attachments of the vitreous to the retinal surface.  A preretinal membrane was noted, covering a large area of preretinal hemorrhage, which covered the entire foveal and avascular zone and macula.  Neovascularization of the optic nerve was noted.  Using the [facility] pick, the membrane was engaged at the  edge of the optic nerve and then gently elevated.  It came loose quite easily from the posterior retina into the mid vitreous.  It was then aspirated and removed with the vitreous infusion suction cutter.  A large area of preretinal hemorrhage was noted and using a [place] optical brush tip applicator, the blood was aspirated from the retinal surface.  Scattered microaneurysms were seen about the posterior pole as well as the optic nerve neovascularization.  The endolaser photocoagulator was assembled and 1038 applications made with a panretinal scatter technique. Good burns were achieved.  The fundus was inspected with indirect ophthalmoscopy and revealed no peripheral retinal tears, detachment areas, or neovascularization.  It was then elected to close.  The sclerotomy sites were closed with 7-0 interrupted Vicryl sutures and the infusion terminal removed. A small corneal abrasion occurred at the limbus adjacent to the infusion terminal site as it was removed.  The conjunctiva was then closed with a running 7-0 Vicryl suture.  Depo, garamycin and Celestone were injected in the sub Tenons space inferiorly.  Maxitrol and atropine ointment were instilled in the conjunctival cul-de-sac.  A light patch and protective shield were applied to the operated right eye.  Duration of procedure and anesthesia administration was 1 to 1-1/2 hours.  The patient tolerated the procedure well in general and left the operating room for the recovery room and subsequently to the 23-hour observation unit. Dictated by:   [name], M.D. Attending Physician:  Ivor Messier DD:  11/22/01 TD:  11/23/01 Job: [id number]

## 2011-01-27 NOTE — Consult Note (Signed)
Kimberly [name], Kimberly [name]                 ACCOUNT NO.:  [id number]   MEDICAL RECORD NO.:  [id number]          PATIENT TYPE:  INP   LOCATION:  5704                         FACILITY:  [facility]   PHYSICIAN:  Adolph Pollack, M.D.DATE OF BIRTH:  [birth year]   DATE OF CONSULTATION:  11/16/2006  DATE OF DISCHARGE:                                 CONSULTATION   REQUESTING PHYSICIAN:  Quita Skye. Artis Flock, M.D.   REASON FOR CONSULTATION:  Abdominal abscess.   HISTORY OF PRESENT ILLNESS:  Ms. Cowing is a 55 year old female who is a  poorly controlled diabetic.  She has noted a painful, firm red area in  the right lower quadrant that has become increasingly painful.  She went  and saw Dr. Artis Flock and there was concern that she may possibly have an  abscess.  She subsequently was sent to our urgent office.  She denies  injecting herself in this area with insulin.  She admits that she does  not control her diabetes well and feels that her glucose may be above  400 at this time.   PAST MEDICAL HISTORY:  1. Diabetes mellitus currently on insulin.  2. Hypertension.  3. Glaucoma.  4. Retinal disease.  5. Asthma.  6. COPD.  7. Hemorrhoids.  8. She reports some heart disease.   PAST SURGICAL HISTORY:  1. Carotid endarterectomy.  2. Two eye surgeries.   MEDICATIONS:  Insulin, Lyrica, Travatan, Alphagan, Cosopt- last three  medicines all for her glaucoma.   SOCIAL HISTORY:  She smokes 1/2 pack of cigarettes daily.  She denies  alcohol use.   FAMILY HISTORY:  Notable for heart disease, breast cancer, diabetes,  prostate disease.   REVIEW OF SYSTEMS:  Positive for the fact that she wears glasses and  still has blurred vision.  She has some sinus problems.  She reports  having poor distal circulation.  She does occasionally cough.  She is  menopausal.  She has some joint pain and difficulty walking at times.  She has easy bruisability.   PHYSICAL EXAMINATION:  GENERAL:  A slightly ill appearing obese  female.  VITAL SIGNS:  Temperature 100.7, blood pressure is 182/81, pulse 92.  RESPIRATORY:  Breath sounds are equal and clear.  Respirations are  unlabored.  CARDIOVASCULAR: Regular rate, regular rhythm.  No adenopathy or murmur.  NECK:  No JVD.  ABDOMEN:  Obese.  In the right lower quadrant/pelvic region, there is a  pannus and there is a firm, red indurated area with no fluctuant mild  tenderness to it.   IMPRESSION:  1. Panniculitis, cannot rule out abscess, although there is no      fluctuant area.  There is no obvious drainable area at this time.  2. Poorly controlled diabetes.   PLAN:  I talked with Dr. Artis Flock.  Will admit her to [facility] to the [facility]  [employer].  Will start her on IV antibiotics.  Will order a CT  scan to evaluate for abscess to see if something can be drained.      Adolph Pollack, M.D.  Electronically Signed  TJR/MEDQ  D:  11/19/2006  T:  11/19/2006  Job:  [id number]   cc:   Quita Skye. Artis Flock, M.D.

## 2011-01-27 NOTE — H&P (Signed)
. [employer]  Patient:    Kimberly [name], Kimberly [name] Visit Number: [id number] MRN: [id number]          Service Type: Attending:  Quita Skye. Hart Rochester, M.D. Dictated by:   Durenda Age, P.A.-C. Adm. Date:  06/19/01   CC:         [name], M.D.  Quita Skye Artis Flock, M.D.   History and Physical  DATE OF BIRTH:  02-Jan-1956  CHIEF COMPLAINT:  Left coronary artery disease.  HISTORY OF PRESENT ILLNESS:  [age]-year-old white female referred by Dr. Quita Skye. Kindl for evaluation of coronary artery disease.  The patient was undergoing a routine physical exam when a carotid bruit was heard.  Carotid Dopplers at [facility] on September 9th shows 70-79% left ICA stenosis, with mild right ICA stenosis.  Repeat studies at [facility], confirmed the diagnosis, showing a left ICA stenosis of about 80%.  Options were discussed, and the patient opted for surgical repair, scheduled for June 19, 2001.  No headache, nausea, vomiting or vertigo.  No new episodes of dizziness, since the patient experienced labyrinthitis.  No falls, other than the one experienced about one year ago, in which she fell on her left shoulder, and since then having chronic left shoulder discomfort.  No seizures.  Occasional left arm numbness. No tingling.  No muscle weakness or speech impairment.  No dysphagia or vision changes.  No syncope or presyncope.  No memory loss or confusion.  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  History of labyrinthitis, about four months ago in June 2002.  Cardiomegaly.  IDDM.  History of asthma as a child.  History of tobacco abuse.  History of pneumonia in 2001.  SURGERIES:  None.  MEDICATIONS: 1. Altace 10 mg q.d. 2. Actos 45 mg q.d. 3. Amaryl 4 mg q.d. 4. Lipitor 10 mg q.d. 5. Lantus 25 units q.d. 6. Lumigan eye drops.  ALLERGIES:  No known drug allergies.  REVIEW OF SYSTEMS:  See HPI  and past medical history for significant positives.  The patient denies any history of kidney disease.  No heart disease.  Occasionally, she experiences constipation, and GERD symptoms.  She has had about 60-pound weight gain, secondary to new medications, including Amaryl.  FAMILY HISTORY:  Mother died of Hodgkins, breast cancer and a history of CVA. Father alive with a history of coronary artery disease, CAD, status post CABG. One sister alive with a history of CVA.  One brother alive with a history of diabetes, age 91.  SOCIAL HISTORY:  Married.  No children.  She is a Recruitment consultant.  She used to smoke 1 pack a day of cigarettes for 25 years, she is down to 1/3 pack a day, she is trying to quit.  She denies any alcohol intake.  PHYSICAL EXAMINATION:  GENERAL:  Obese 55 year old white female in no acute distress, alert and oriented x 3.  VITAL SIGNS:  Blood pressure 140/60, pulse 80, respirations 18.  HEENT:  Normocephalic, atraumatic.  PERRLA.  EOMI.  In the left funduscopic exam, more opacity than in the right funduscopic exam is seen.  NECK:  Supple.  No JVD.  Bilateral bruits, more pronounced on the left.  CHEST:  Symmetrical on inspiration.  Lungs clear to auscultation bilaterally.  CARDIOVASCULAR:  Regular rate and rhythm with 1 to 2+ holosystolic murmur.  No rubs or gallops.  ABDOMEN:  Soft, nontender.  Bowel sounds x 4.  No masses or bruits.  GU:  Deferred.  RECTAL:  Deferred.  EXTREMITIES:  No clubbing or cyanosis.  Mild bilateral edema.  No ulcerations. Warm temperature.  PERIPHERAL PULSES:  2+ bilaterally from carotid through distal pulses.  NEUROLOGIC:  Nonfocal.  Gait steady.  DTRs 2+ bilaterally.  Muscle strength 5/5.  ASSESSMENT AND PLAN:  Left internal carotid artery stenosis, for left carotid endarterectomy, June 19, 2001.  Dr. Hart Rochester has seen and evaluated this patient prior to the admission and has explained the risks and benefits involved in  the procedure and the patient has agreed to continue. Dictated by:   Durenda Age, P.A.-C. Attending:  Quita Skye. Hart Rochester, M.D. DD:  06/17/01 TD:  06/17/01 Job: [id number]

## 2011-01-27 NOTE — Op Note (Signed)
[place]  Patient:    Kimberly [name], Kimberly [name]. Visit Number: [id number] MRN: [id number]          Service Type: Attending:  Quita Skye. Hart Rochester, M.D. Dictated by:   [name], M.D. Proc. Date: 06/19/01                             Operative Report  PREOPERATIVE DIAGNOSIS:  Severe left internal carotid stenosis - asymptomatic.  POSTOPERATIVE DIAGNOSIS:  Severe left internal carotid stenosis - asymptomatic.  OPERATION:  Left carotid endarterectomy with Dacron patch angioplasty.  SURGEON:  Quita Skye. Hart Rochester, M.D.  FIRST ASSISTANT:  Loura Pardon, P.A.  ANESTHESIA:  General endotracheal.  HISTORY OF PRESENT ILLNESS:  This patient was found to have a moderately severe left internal carotid stenosis by duplex scanning which was asymptomatic. Repeat scan in our office revealed the stenosis to approximate 80%, and options were discussed with the patient, including following this lesion with sequential scanning or left carotid endarterectomy. She chose to proceed with left carotid surgery.  DESCRIPTION OF PROCEDURE:  The patient was taken to the operating room and placed in the supine position. At which time, satisfactory general endotracheal anesthesia was administered. The left neck was prepped with Betadine scrub and solution and draped in a routine sterile manner. Incision was made along the anterior border of the sternocleidomastoid muscle and carried down through subcutaneous tissue and platysma using the Bovie. The common facial vein and external jugular veins were ligated with 3-0 silk ties and divided exposing the common, internal, and external carotid arteries. Care was taken not to injure the vagus or hypoglossal nerves both of which were exposed. There was a calcified atherosclerotic plaque at the carotid bifurcation extending up posteriorly in the internal carotid about 3 to 4 cm with the distal vessel appearing normal. A #10 shunt was prepared,  and the patient was heparinized. The carotid vessels were occluded with vascular clamps. A longitudinal opening made in the common carotid with 15 blade extended up the internal carotid with the Potts scissors to a point distal to the disease. The plaque appeared to be 80 to 90% stenotic in severity and was very calcified. The distal vessel appeared normal. The #10 shunt was inserted without difficulty reestablishing flow in about 2 minutes. A standard endarterectomy was then performed using elevator and the Potts scissors with an eversion endarterectomy of the external carotid. The plaque feathered off the distal internal carotid artery nicely not requiring any tacking sutures. The lumen was thoroughly irrigated with heparin/saline, and all this debris carefully removed. The arteriotomy was then closed with a Dacron patch using continuous 6-0 Prolene. Prior to completion of the closure, the shunt was removed after about 30 minutes of shunt time. Following antegrade and retrograde flushing, the closure was completed with reestablishment of flow initially up the external and up the internal branch. The carotid was occluded for less than 2 minutes for removal of the shunt. Protamine was then given to reverse the heparin. Following adequate hemostasis, the wound was irrigated with saline and closed in layers with Vicryl in a subcuticular fashion. Sterile dressing applied. The patient taken to the recovery room in satisfactory condition.Dictated by:   [name], M.D. Attending:  Quita Skye. Hart Rochester, M.D. DD:  06/19/01 TD:  06/19/01 Job: [id number]

## 2011-01-27 NOTE — H&P (Signed)
[place]. [facility]  Patient:    Kimberly [name], Kimberly [name] Visit Number: [id number] MRN: [id number]          Service Type: DSU Location: 4700 [id number] Attending Physician:  Ivor Messier Dictated by:   [name], M.D. Admit Date:  11/22/2001 Discharge Date: 11/23/2001   CC:         John B. Charise Killian, M.D.  Quita Skye Artis Flock, M.D.   History and Physical  This was an urgent outpatient admission of this 55 year old white female, insulin dependent diabetic, admitted with proliferative diabetic retinopathy and a large vitreous and preretinal hemorrhage, right eye, covering the macula.  HISTORY OF PRESENT ILLNESS:  This patient has a history of insulin dependent diabetes mellitus and secondary complications of proliferative diabetic retinopathy and recurrent vitreous hemorrhage of both eyes.  The patient was first seen in my office on October 23, 2000, at which time she stated that she had had a previous vitreous hemorrhage of the left eye 4-6 months ago, which resolved spontaneously.  Initial examination revealed a visual acuity of 20/30 right eye, 20/25 left eye with correction.  Detailed fundus examination revealed a clear vitreous attached retina.  Background diabetic retinopathy was noted with a few dot hemorrhages in the right eye and the left eye noted to have slight early proliferative retinopathy with some neovascularization of the optic nerve and peripheral retina.  The patient continued to do well without further bleeding.  Suddenly the patient lost the vision in her right eye and was seen by Dr. Charise Killian, her regular ophthalmologist.  A large preretinal and vitreous hemorrhage was noted, covering the entire macular area and causing the patient to have a large central scotoma with decreased vision to hand motion.  Due to the central location and thickness of the hemorrhage in front of the macula, it was felt that vitreous surgery was  indicated. Arrangements were made for her outpatient admission.  The patient was given oral discussion and printed information concerning the procedure and possible complications.  She signed an informed consent and arrangements were made for her outpatient admission at this time.  The patient also has a history of chronic open angle glaucoma and was on topical medications, consisting of Azopt three times a day, pilocarpine ophthalmic solution, and Lumigan or Xalatan ophthalmic solution to both eyes.  REVIEW OF SYSTEMS:  No cardiorespiratory complaint.  PAST MEDICAL HISTORY:  The patient is under Dr. Artis Flock and has had recent vascular surgery by Dr. Hart Rochester, consisting of a left carotid endarterectomy on June 19, 2001, for asymptomatic severe left internal carotid stenosis.  The patient has been taking one aspirin a day under his direction.  She has not had a cerebrovascular accident.  PHYSICAL EXAMINATION:  VITAL SIGNS (AS RECORDED ON ADMISSION):  Blood pressure 131/63, pulse 85, respirations 18, temperature 97.7.  GENERAL APPEARANCE:  The patient is a pleasant, well-nourished, well-developed, white 55 year old female in acute ocular distress.  HEENT:  Ocular exam as noted above.  CHEST:  Lungs clear to percussion and auscultation.  HEART:  Normal sinus rhythm, no cardiogemaly, no murmurs.  ABDOMEN:  Negative.  EXTREMITIES:  Negative.  ADMISSION DIAGNOSES: 1. Proliferative diabetic retinopathy. 2. Insulin-dependent diabetes mellitus. 3. Vitreous and preretinal hemorrhage, right eye.  SURGICAL PLAN:  Pars plana vitrectomy using vitreous infusion suction cutter with membrane peeling and excision and endolaser panretinal photocoagulation. Dictated by:   [name], M.D. Attending Physician:  Ivor Messier DD:  11/22/01 TD:  11/23/01 Job:  [id number]

## 2011-01-27 NOTE — Discharge Summary (Signed)
Kimberly [name], Kimberly [name]                 ACCOUNT NO.:  [id number]   MEDICAL RECORD NO.:  [id number]          PATIENT TYPE:  INP   LOCATION:  5704                         FACILITY:  [facility]   PHYSICIAN:  Michaelyn Barter, M.D. DATE OF BIRTH:  [birth year]   DATE OF ADMISSION:  11/16/2006  DATE OF DISCHARGE:  11/24/2006                               DISCHARGE SUMMARY   FINAL DIAGNOSIS:  Panniculitis with abscess   SECONDARY DIAGNOSES:  1. Diabetes mellitus.  2. Hypertension.   PROCEDURES.:  1. Wound incision and drainage, completed March10,2008.  2. CT scan of the abdomen and pelvis, completed March7,2008.  3. Peripheral vascular studies, completed March13,2008.  4. The patient also has peripheral venous Dopplers completed this      hospitalization of both her lower extremities on March13,2008, and      they were negative for DVT.   CONSULTATIONS:  General surgery.   HISTORY OF PRESENT ILLNESS:  Kimberly [name] is a 55 year old female who  arrived with a complaint of pain and swelling in her lower abdominal  region for approximately 5 days.  She went to see her primary care  physician and was referred to the hospital for further evaluation.  Dr.  Abbey Chatters were diagnosed the patient with panniculitis and she was  started on empiric IV antibiotics.   PAST MEDICAL HISTORY:  Please see that dictated by Dr. Della Goo.   HOSPITAL COURSE:  1. Panniculitis with abscess formation.  A CT scan of the patient's      abdomen and pelvis was completed on March7,2008.  The CT scan of      the abdomen revealed no acute abdominal findings.  CT scan of the      pelvis revealed stranding within the subcutaneous soft tissues of      the right lower anterior pelvic wall.  Stranding extended down to      the rectus muscle.  No well-defined abscess was seen.  General      surgery was consulted.  Dr. Avel Peace saw the patient on      March7,2008.  He indicated initially that there was no obvious   drainable area when the patient was first admitted into the      hospital.  Later on March10, 2008, the patient underwent a bedside      I&D.  Blood cultures were obtained.  They were found to be      negative.  Cultures from the patient's abdominal abscess revealed      few staph aureus, moderate group B strep was identified.  Physical      therapy was consulted to provide pulse lavage daily as well as      wound packaging.  The patient had a wound created that measured 3.3-      cm.  By the date of discharge, the patient began to indicate that      she felt significantly better.  2. Diabetes mellitus.  The patient's sugar remained relatively stable.  3. Hypertension.  The patient's blood pressure was slightly above      goal.  On  the date of discharge, it was decided that the patient      could follow up with her primary care physician regarding this.  On      the day of discharge, the patient indicated she felt better, and      she had no new complaints.   Her vitals:  Her temperature was 98.7, heart rate 70, respirations 18,  blood pressure 167/71, O2 sat 97% on room air.   Her labs:  Her white blood cell count was 12.5, hemoglobin 10.9,  hematocrit 31.9, and platelets 267.   The patient was discharged home on:  1. Doxycycline 100 mg p.o. b.i.d.  2. Lasix 20 mg daily  3. Zofran 4 mg p.o. q.8 h p.r.n.  4. Oxycodone 5 mg p.o. q.6 h p.r.n.  5. Lantus insulin 35 units daily  6. Lyrica 75 mg p.o. daily.  7. Cosopt 1 drop into the right eye b.i.d.   She was told to follow up with Dr. Bradd Canary and to call for  appointment.      Michaelyn Barter, M.D.  Electronically Signed     OR/MEDQ  D:  01/17/2007  T:  01/17/2007  Job:  [id number]

## 2011-01-27 NOTE — H&P (Signed)
[place]. [facility]  Patient:    Kimberly [name], Kimberly [name] Visit Number: [id number] MRN: [id number]          Service Type: Attending:  Quita Skye. Hart Rochester, M.D. Dictated by:   Durenda Age, P.A.-C. Adm. Date:  06/19/01   CC:         Quita Skye. Artis Flock, M.D.  [name], M.D.   History and Physical  INCOMPLETE  DATE OF BIRTH:  01/04/1956  CHIEF COMPLAINT:  Left carotid artery disease.  HISTORY OF PRESENT ILLNESS:  A 55 year old white female referred by Dr. Artis Flock for evaluation of carotid artery disease.  The patient was undergoing a routine physical examination when a carotid bruit was heard.  Carotid Dopplers at [facility] on September 9 showed 70 to 79% left ICA stenosis with 50 to 79% right ICA stenosis.  Repeat study at [facility] confirmed the finding on the left side with about 80% ICA stenosis.  Options were discussed including surgery for which the patient has agreed to proceed.  Other than some dizziness experienced in the last six month secondary to labyrinthitis and chronic left shoulder discomfort, the patient denies any headache, nausea, vomiting, or recent vertigo.  No seizures.  She has occasional numbness in the left arm, no tingling.  No muscle weakness or speech impairment.  No dysphagia or vision changes.  No syncope or presyncope.  No memory loss or confusion.  PAST MEDICAL HISTORY: 1. Coronary artery disease. 2. History of retinopathy status post left retinal hemorrhage in the left,    remote. 3. History of labyrinthitis about four months ago. 4. Cardiomegaly. 5. IDDM. 6. History of asthma as a child Dictated by:   Durenda Age, P.A.-C. Attending:  Quita Skye. Hart Rochester, M.D. DD:  06/17/01 TD:  06/17/01 Job: [id number]

## 2011-03-07 ENCOUNTER — Other Ambulatory Visit: Payer: Self-pay | Admitting: *Deleted

## 2011-03-07 MED ORDER — FUROSEMIDE 20 MG PO TABS: 20.0000 mg | ORAL_TABLET | Freq: Every day | ORAL | Status: DC

## 2011-06-21 ENCOUNTER — Other Ambulatory Visit: Payer: Self-pay | Admitting: *Deleted

## 2011-06-21 MED ORDER — OLMESARTAN MEDOXOMIL 20 MG PO TABS: 20.0000 mg | ORAL_TABLET | Freq: Every day | ORAL | Status: DC

## 2011-06-21 MED ORDER — METOPROLOL TARTRATE 25 MG PO TABS: 25.0000 mg | ORAL_TABLET | Freq: Two times a day (BID) | ORAL | Status: DC

## 2011-11-04 ENCOUNTER — Other Ambulatory Visit: Payer: Self-pay | Admitting: Cardiology

## 2011-11-14 ENCOUNTER — Other Ambulatory Visit: Payer: Self-pay | Admitting: *Deleted

## 2011-11-14 ENCOUNTER — Other Ambulatory Visit: Payer: Self-pay

## 2011-11-14 MED ORDER — OLMESARTAN MEDOXOMIL 20 MG PO TABS: 20.0000 mg | ORAL_TABLET | Freq: Every day | ORAL | Status: DC

## 2011-11-14 MED ORDER — FUROSEMIDE 20 MG PO TABS: 20.0000 mg | ORAL_TABLET | Freq: Every day | ORAL | Status: DC

## 2011-12-07 ENCOUNTER — Encounter: Payer: Self-pay | Admitting: *Deleted

## 2011-12-07 ENCOUNTER — Encounter: Payer: Self-pay | Admitting: Cardiology

## 2011-12-18 ENCOUNTER — Encounter: Payer: Self-pay | Admitting: Cardiology

## 2011-12-18 ENCOUNTER — Ambulatory Visit (INDEPENDENT_AMBULATORY_CARE_PROVIDER_SITE_OTHER): Payer: Managed Care, Other (non HMO) | Admitting: Cardiology

## 2011-12-18 VITALS — BP 187/90 | HR 99 | Ht 64.0 in | Wt 279.0 lb

## 2011-12-18 DIAGNOSIS — R0609 Other forms of dyspnea: Secondary | ICD-10-CM

## 2011-12-18 DIAGNOSIS — R609 Edema, unspecified: Secondary | ICD-10-CM

## 2011-12-18 DIAGNOSIS — E785 Hyperlipidemia, unspecified: Secondary | ICD-10-CM

## 2011-12-18 DIAGNOSIS — R0600 Dyspnea, unspecified: Secondary | ICD-10-CM

## 2011-12-18 DIAGNOSIS — I739 Peripheral vascular disease, unspecified: Secondary | ICD-10-CM

## 2011-12-18 DIAGNOSIS — I6529 Occlusion and stenosis of unspecified carotid artery: Secondary | ICD-10-CM

## 2011-12-18 DIAGNOSIS — I10 Essential (primary) hypertension: Secondary | ICD-10-CM

## 2011-12-18 MED ORDER — OLMESARTAN MEDOXOMIL 20 MG PO TABS: 20.0000 mg | ORAL_TABLET | Freq: Every day | ORAL | Status: DC

## 2011-12-18 MED ORDER — FUROSEMIDE 40 MG PO TABS: 40.0000 mg | ORAL_TABLET | Freq: Every day | ORAL | Status: DC

## 2011-12-18 NOTE — Progress Notes (Signed)
   HPI: Pleasant female I initially saw in August of 2011. Following carotid endarterectomy she went into pulmonary edema. Cardiac catheterization in August of 2011 revealed  and ejection fraction of 70%. The LAD had a 30% proximal and 70% distal lesion. It appeared diffusely diseased and diabetic appearing. There is a very small first marginal branch that has about 70-80% narrowing proximally. There is a tiny second marginal branch with about 50% narrowing.There is a small nondominant right coronary artery without critical narrowing. She has been treated medically. Echocardiogram was repeated in December of 2011 and revealed an ejection fraction of 60-65%. There was a dynamic obstruction of 2.3 m/s. There is mild mitral regurgitation and trace aortic insufficiency. Since she was last seen in Jan 2012 she she has dyspnea on exertion. She has chronic pedal edema. No orthopnea. No chest pain or syncope.  Current Outpatient Prescriptions  Medication Sig Dispense Refill  . furosemide (LASIX) 20 MG tablet Take 1 tablet (20 mg total) by mouth daily.  30 tablet  0  . metoprolol tartrate (LOPRESSOR) 25 MG tablet TAKE 1 TABLET BY MOUTH 2 TIMES DAILY.  60 tablet  3  . olmesartan (BENICAR) 20 MG tablet Take 1 tablet (20 mg total) by mouth daily.  30 tablet  6     Past Medical History  Diagnosis Date  . HYPERLIPIDEMIA   . HYPERTENSION   . CAD, NATIVE VESSEL   . PVD   . DM   . COPD   . Edema     Past Surgical History  Procedure Date  . Eye surgery     History   Social History  . Marital Status: Married    Spouse Name: N/A    Number of Children: N/A  . Years of Education: N/A   Occupational History  . Not on file.   Social History Main Topics  . Smoking status: Former Games developer  . Smokeless tobacco: Not on file  . Alcohol Use: Not on file  . Drug Use: Not on file  . Sexually Active: Not on file   Other Topics Concern  . Not on file   Social History Narrative  . No narrative on file     ROS: no fevers or chills, productive cough, hemoptysis, dysphasia, odynophagia, melena, hematochezia, dysuria, hematuria, rash, seizure activity, orthopnea, PND,  claudication. Remaining systems are negative.  Physical Exam: Well-developed obese in no acute distress.  Skin is warm and dry.  HEENT is normal.  Neck is supple. No thyromegaly.  Chest is clear to auscultation with normal expansion.  Cardiovascular exam is regular rate and rhythm.  Abdominal exam nontender or distended. No masses palpated. Extremities show 1-2+ edema. neuro grossly intact  ECG sinus rhythm at a rate of 99. Left bundle branch block.

## 2011-12-18 NOTE — Assessment & Plan Note (Signed)
Continue aspirin.Repeat carotid Dopplers. 

## 2011-12-18 NOTE — Patient Instructions (Addendum)
Your physician wants you to follow-up in: ONE YEAR You will receive a reminder letter in the mail two months in advance. If you don't receive a letter, please call our office to schedule the follow-up appointment.   Your physician has requested that you have an echocardiogram. Echocardiography is a painless test that uses sound waves to create images of your heart. It provides your doctor with information about the size and shape of your heart and how well your heart's chambers and valves are working. This procedure takes approximately one hour. There are no restrictions for this procedure.   Your physician has requested that you have a carotid duplex. This test is an ultrasound of the carotid arteries in your neck. It looks at blood flow through these arteries that supply the brain with blood. Allow one hour for this exam. There are no restrictions or special instructions.   INCREASE FUROSEMIDE TO 40 MG ONCE DAILY  Your physician recommends that you return for lab work in: ONE WEEK

## 2011-12-18 NOTE — Assessment & Plan Note (Signed)
Continue aspirin. Her statin was discontinued previously because of elevated liver functions. She has not tolerated Lipitor previously. We discussed adding Pravachol but she is concerned about potential side effects and would prefer to avoid.

## 2011-12-18 NOTE — Assessment & Plan Note (Signed)
Patient has worsening pedal edema. Repeat echocardiogram for LV and RV function. Increase Lasix to 40 mg daily. Check potassium and renal function in one week.

## 2011-12-18 NOTE — Assessment & Plan Note (Signed)
Declined attempts at different statin.

## 2011-12-18 NOTE — Assessment & Plan Note (Signed)
Blood pressure is elevated but she states typically controlled. She will follow this and we will increase medications as needed.

## 2011-12-18 NOTE — Progress Notes (Signed)
Addended by: Freddi Starr on: 12/18/2011 04:58 PM   Modules accepted: Orders

## 2011-12-21 NOTE — Progress Notes (Signed)
Addended by: Reine Just on: 12/21/2011 10:45 AM   Modules accepted: Orders

## 2011-12-28 ENCOUNTER — Other Ambulatory Visit (INDEPENDENT_AMBULATORY_CARE_PROVIDER_SITE_OTHER): Payer: Managed Care, Other (non HMO)

## 2011-12-28 ENCOUNTER — Ambulatory Visit (HOSPITAL_COMMUNITY): Payer: Managed Care, Other (non HMO) | Attending: Cardiology

## 2011-12-28 DIAGNOSIS — Z87891 Personal history of nicotine dependence: Secondary | ICD-10-CM | POA: Insufficient documentation

## 2011-12-28 DIAGNOSIS — R0600 Dyspnea, unspecified: Secondary | ICD-10-CM

## 2011-12-28 DIAGNOSIS — E785 Hyperlipidemia, unspecified: Secondary | ICD-10-CM | POA: Insufficient documentation

## 2011-12-28 DIAGNOSIS — I10 Essential (primary) hypertension: Secondary | ICD-10-CM | POA: Insufficient documentation

## 2011-12-28 DIAGNOSIS — E119 Type 2 diabetes mellitus without complications: Secondary | ICD-10-CM | POA: Insufficient documentation

## 2011-12-28 DIAGNOSIS — Z8673 Personal history of transient ischemic attack (TIA), and cerebral infarction without residual deficits: Secondary | ICD-10-CM | POA: Insufficient documentation

## 2011-12-28 DIAGNOSIS — I2510 Atherosclerotic heart disease of native coronary artery without angina pectoris: Secondary | ICD-10-CM | POA: Insufficient documentation

## 2011-12-28 DIAGNOSIS — R0609 Other forms of dyspnea: Secondary | ICD-10-CM | POA: Insufficient documentation

## 2011-12-28 DIAGNOSIS — I679 Cerebrovascular disease, unspecified: Secondary | ICD-10-CM | POA: Insufficient documentation

## 2011-12-28 DIAGNOSIS — R0989 Other specified symptoms and signs involving the circulatory and respiratory systems: Secondary | ICD-10-CM

## 2011-12-28 DIAGNOSIS — J4489 Other specified chronic obstructive pulmonary disease: Secondary | ICD-10-CM | POA: Insufficient documentation

## 2011-12-28 DIAGNOSIS — J449 Chronic obstructive pulmonary disease, unspecified: Secondary | ICD-10-CM | POA: Insufficient documentation

## 2011-12-29 LAB — BASIC METABOLIC PANEL
BUN: 25 mg/dL — ABNORMAL HIGH (ref 6–23)
Chloride: 96 mEq/L (ref 96–112)
Creatinine, Ser: 1.3 mg/dL — ABNORMAL HIGH (ref 0.4–1.2)
Glucose, Bld: 197 mg/dL — ABNORMAL HIGH (ref 70–99)
Potassium: 4.4 mEq/L (ref 3.5–5.1)

## 2012-01-02 ENCOUNTER — Other Ambulatory Visit: Payer: Self-pay | Admitting: *Deleted

## 2012-01-02 DIAGNOSIS — R931 Abnormal findings on diagnostic imaging of heart and coronary circulation: Secondary | ICD-10-CM

## 2012-01-03 ENCOUNTER — Telehealth: Payer: Self-pay | Admitting: *Deleted

## 2012-01-03 NOTE — Telephone Encounter (Signed)
Left message for patient to call and schedule Muga Study, per Dr. Jens Som.

## 2012-01-09 ENCOUNTER — Telehealth: Payer: Self-pay | Admitting: *Deleted

## 2012-01-09 ENCOUNTER — Encounter (INDEPENDENT_AMBULATORY_CARE_PROVIDER_SITE_OTHER): Payer: Managed Care, Other (non HMO)

## 2012-01-09 DIAGNOSIS — R0989 Other specified symptoms and signs involving the circulatory and respiratory systems: Secondary | ICD-10-CM

## 2012-01-09 DIAGNOSIS — I6529 Occlusion and stenosis of unspecified carotid artery: Secondary | ICD-10-CM

## 2012-01-09 NOTE — Telephone Encounter (Signed)
Spoke to patient today about scheduling a Muga Study.  Kimberly [name] will call me back after she looks at her work schedule.

## 2012-01-18 ENCOUNTER — Ambulatory Visit (HOSPITAL_COMMUNITY): Payer: Managed Care, Other (non HMO) | Attending: Cardiology | Admitting: Radiology

## 2012-01-18 VITALS — Ht 64.0 in | Wt 279.0 lb

## 2012-01-18 DIAGNOSIS — E119 Type 2 diabetes mellitus without complications: Secondary | ICD-10-CM | POA: Insufficient documentation

## 2012-01-18 DIAGNOSIS — Z1389 Encounter for screening for other disorder: Secondary | ICD-10-CM | POA: Insufficient documentation

## 2012-01-18 DIAGNOSIS — I10 Essential (primary) hypertension: Secondary | ICD-10-CM | POA: Insufficient documentation

## 2012-01-18 DIAGNOSIS — Z87891 Personal history of nicotine dependence: Secondary | ICD-10-CM | POA: Insufficient documentation

## 2012-01-18 DIAGNOSIS — Z8679 Personal history of other diseases of the circulatory system: Secondary | ICD-10-CM | POA: Insufficient documentation

## 2012-01-18 DIAGNOSIS — E669 Obesity, unspecified: Secondary | ICD-10-CM | POA: Insufficient documentation

## 2012-01-18 DIAGNOSIS — R931 Abnormal findings on diagnostic imaging of heart and coronary circulation: Secondary | ICD-10-CM

## 2012-01-18 DIAGNOSIS — R0602 Shortness of breath: Secondary | ICD-10-CM

## 2012-01-18 DIAGNOSIS — I739 Peripheral vascular disease, unspecified: Secondary | ICD-10-CM | POA: Insufficient documentation

## 2012-01-18 DIAGNOSIS — E785 Hyperlipidemia, unspecified: Secondary | ICD-10-CM | POA: Insufficient documentation

## 2012-01-18 DIAGNOSIS — I779 Disorder of arteries and arterioles, unspecified: Secondary | ICD-10-CM | POA: Insufficient documentation

## 2012-01-18 DIAGNOSIS — J4489 Other specified chronic obstructive pulmonary disease: Secondary | ICD-10-CM | POA: Insufficient documentation

## 2012-01-18 DIAGNOSIS — I447 Left bundle-branch block, unspecified: Secondary | ICD-10-CM | POA: Insufficient documentation

## 2012-01-18 DIAGNOSIS — J449 Chronic obstructive pulmonary disease, unspecified: Secondary | ICD-10-CM | POA: Insufficient documentation

## 2012-01-18 MED ORDER — TECHNETIUM TC 99M-LABELED RED BLOOD CELLS IV KIT
31.0000 | PACK | Freq: Once | INTRAVENOUS | Status: AC | PRN
  Administered 2012-01-18: 31 via INTRAVENOUS

## 2012-01-18 NOTE — Progress Notes (Signed)
Muga Study  Referring Provider:  Lewayne Bunting, MD  Date of Procedure: 01/18/2012 22g NS lock established to Right Forearm by Stanton Kidney, EMT-P.  Indication: Evaluate EF  Risk Factors: History of smoking, HTN, Increased Lipids, NIDDM, CVA, PVD, Carotid Disease, COPD, Obesity, LBBB  History:  04/2010- Cardiac Cath: N/O Dz, EF=70%-medical management; 12/28/11 Echo EF severely limited, poor quality study > MUGA to evaluate EF  Symptoms:  DOE/SOB  Muga Information:  The patient's red blood cells were labeled using the Ultra Tag method with 31.0 mci of Technetium 58m Pertechnetate.  The images were reconstructed in the Anterior, Lateral and Left Anterior oblique views.  Impression: Good quality study.  EF 62%.   Amonda Brillhart [employer]

## 2012-03-10 ENCOUNTER — Other Ambulatory Visit: Payer: Self-pay | Admitting: Cardiology

## 2012-06-13 ENCOUNTER — Other Ambulatory Visit: Payer: Self-pay | Admitting: Cardiology

## 2012-09-15 ENCOUNTER — Encounter (HOSPITAL_COMMUNITY): Payer: Self-pay | Admitting: *Deleted

## 2012-09-15 ENCOUNTER — Emergency Department (HOSPITAL_COMMUNITY): Payer: Managed Care, Other (non HMO)

## 2012-09-15 ENCOUNTER — Emergency Department (HOSPITAL_COMMUNITY)
Admission: EM | Admit: 2012-09-15 | Discharge: 2012-09-15 | Disposition: A | Discharge status: Other Acute Inpatient Hospital | Payer: Managed Care, Other (non HMO) | Attending: Emergency Medicine | Admitting: Emergency Medicine

## 2012-09-15 DIAGNOSIS — Z8673 Personal history of transient ischemic attack (TIA), and cerebral infarction without residual deficits: Secondary | ICD-10-CM | POA: Insufficient documentation

## 2012-09-15 DIAGNOSIS — I2510 Atherosclerotic heart disease of native coronary artery without angina pectoris: Secondary | ICD-10-CM | POA: Insufficient documentation

## 2012-09-15 DIAGNOSIS — Z87891 Personal history of nicotine dependence: Secondary | ICD-10-CM | POA: Insufficient documentation

## 2012-09-15 DIAGNOSIS — R5381 Other malaise: Secondary | ICD-10-CM | POA: Insufficient documentation

## 2012-09-15 DIAGNOSIS — R112 Nausea with vomiting, unspecified: Secondary | ICD-10-CM | POA: Insufficient documentation

## 2012-09-15 DIAGNOSIS — E119 Type 2 diabetes mellitus without complications: Secondary | ICD-10-CM

## 2012-09-15 DIAGNOSIS — E871 Hypo-osmolality and hyponatremia: Secondary | ICD-10-CM

## 2012-09-15 DIAGNOSIS — Z8679 Personal history of other diseases of the circulatory system: Secondary | ICD-10-CM | POA: Insufficient documentation

## 2012-09-15 DIAGNOSIS — J449 Chronic obstructive pulmonary disease, unspecified: Secondary | ICD-10-CM | POA: Insufficient documentation

## 2012-09-15 DIAGNOSIS — N390 Urinary tract infection, site not specified: Secondary | ICD-10-CM

## 2012-09-15 DIAGNOSIS — Z7982 Long term (current) use of aspirin: Secondary | ICD-10-CM | POA: Insufficient documentation

## 2012-09-15 DIAGNOSIS — J4489 Other specified chronic obstructive pulmonary disease: Secondary | ICD-10-CM | POA: Insufficient documentation

## 2012-09-15 DIAGNOSIS — R339 Retention of urine, unspecified: Secondary | ICD-10-CM

## 2012-09-15 DIAGNOSIS — R300 Dysuria: Secondary | ICD-10-CM | POA: Insufficient documentation

## 2012-09-15 DIAGNOSIS — I10 Essential (primary) hypertension: Secondary | ICD-10-CM | POA: Insufficient documentation

## 2012-09-15 DIAGNOSIS — Z794 Long term (current) use of insulin: Secondary | ICD-10-CM | POA: Insufficient documentation

## 2012-09-15 DIAGNOSIS — R531 Weakness: Secondary | ICD-10-CM

## 2012-09-15 DIAGNOSIS — Z79899 Other long term (current) drug therapy: Secondary | ICD-10-CM | POA: Insufficient documentation

## 2012-09-15 DIAGNOSIS — K5900 Constipation, unspecified: Secondary | ICD-10-CM | POA: Insufficient documentation

## 2012-09-15 DIAGNOSIS — M549 Dorsalgia, unspecified: Secondary | ICD-10-CM | POA: Insufficient documentation

## 2012-09-15 DIAGNOSIS — E785 Hyperlipidemia, unspecified: Secondary | ICD-10-CM | POA: Insufficient documentation

## 2012-09-15 HISTORY — DX: Cerebral infarction, unspecified: I63.9

## 2012-09-15 LAB — URINE MICROSCOPIC-ADD ON

## 2012-09-15 LAB — POCT I-STAT, CHEM 8
BUN: 36 mg/dL — ABNORMAL HIGH (ref 6–23)
Calcium, Ion: 1.1 mmol/L — ABNORMAL LOW (ref 1.12–1.23)
Chloride: 93 mEq/L — ABNORMAL LOW (ref 96–112)
Creatinine, Ser: 1.4 mg/dL — ABNORMAL HIGH (ref 0.50–1.10)
Glucose, Bld: 153 mg/dL — ABNORMAL HIGH (ref 70–99)
HCT: 44 % (ref 36.0–46.0)
Hemoglobin: 15 g/dL (ref 12.0–15.0)
Potassium: 4.3 meq/L (ref 3.5–5.1)
Sodium: 127 mEq/L — ABNORMAL LOW (ref 135–145)
TCO2: 27 mmol/L (ref 0–100)

## 2012-09-15 LAB — URINALYSIS, ROUTINE W REFLEX MICROSCOPIC
Bilirubin Urine: NEGATIVE
Glucose, UA: NEGATIVE mg/dL
Ketones, ur: NEGATIVE mg/dL
Nitrite: NEGATIVE
Protein, ur: 30 mg/dL — AB
Specific Gravity, Urine: 1.01 (ref 1.005–1.030)
Urobilinogen, UA: 2 mg/dL — ABNORMAL HIGH (ref 0.0–1.0)
pH: 5.5 (ref 5.0–8.0)

## 2012-09-15 LAB — CBC WITH DIFFERENTIAL/PLATELET
Basophils Absolute: 0 K/uL (ref 0.0–0.1)
Basophils Relative: 0 % (ref 0–1)
Eosinophils Absolute: 0.2 K/uL (ref 0.0–0.7)
Eosinophils Relative: 1 % (ref 0–5)
HCT: 37.8 % (ref 36.0–46.0)
Hemoglobin: 12.8 g/dL (ref 12.0–15.0)
Lymphocytes Relative: 6 % — ABNORMAL LOW (ref 12–46)
Lymphs Abs: 0.8 K/uL (ref 0.7–4.0)
MCH: 29.9 pg (ref 26.0–34.0)
MCHC: 33.9 g/dL (ref 30.0–36.0)
MCV: 88.3 fL (ref 78.0–100.0)
Monocytes Absolute: 0.8 10*3/uL (ref 0.1–1.0)
Monocytes Relative: 6 % (ref 3–12)
Neutro Abs: 11.4 10*3/uL — ABNORMAL HIGH (ref 1.7–7.7)
Neutrophils Relative %: 87 % — ABNORMAL HIGH (ref 43–77)
Platelets: 246 K/uL (ref 150–400)
RBC: 4.28 MIL/uL (ref 3.87–5.11)
RDW: 14.2 % (ref 11.5–15.5)
WBC: 13.2 K/uL — ABNORMAL HIGH (ref 4.0–10.5)

## 2012-09-15 LAB — BASIC METABOLIC PANEL WITH GFR
Calcium: 9.4 mg/dL (ref 8.4–10.5)
GFR calc Af Amer: 51 mL/min — ABNORMAL LOW (ref >90)
GFR calc non Af Amer: 44 mL/min — ABNORMAL LOW (ref >90)
Glucose, Bld: 151 mg/dL — ABNORMAL HIGH (ref 70–99)
Potassium: 4.1 meq/L (ref 3.5–5.1)
Sodium: 122 meq/L — ABNORMAL LOW (ref 135–145)

## 2012-09-15 LAB — PROTIME-INR
INR: 1.19 (ref 0.00–1.49)
Prothrombin Time: 14.9 s (ref 11.6–15.2)

## 2012-09-15 LAB — OCCULT BLOOD, POC DEVICE: Fecal Occult Bld: NEGATIVE

## 2012-09-15 LAB — BASIC METABOLIC PANEL
BUN: 37 mg/dL — ABNORMAL HIGH (ref 6–23)
CO2: 24 mEq/L (ref 19–32)
Chloride: 84 mEq/L — ABNORMAL LOW (ref 96–112)
Creatinine, Ser: 1.32 mg/dL — ABNORMAL HIGH (ref 0.50–1.10)

## 2012-09-15 LAB — APTT: aPTT: 40 s — ABNORMAL HIGH (ref 24–37)

## 2012-09-15 MED ORDER — DIAZEPAM 5 MG/ML IJ SOLN
5.0000 mg | Freq: Four times a day (QID) | INTRAMUSCULAR | Status: DC | PRN
  Administered 2012-09-15: 5 mg via INTRAVENOUS
  Filled 2012-09-15: qty 2

## 2012-09-15 MED ORDER — FENTANYL CITRATE 0.05 MG/ML IJ SOLN
50.0000 ug | Freq: Once | INTRAMUSCULAR | Status: AC
  Administered 2012-09-15: 50 ug via INTRAVENOUS
  Filled 2012-09-15: qty 2

## 2012-09-15 MED ORDER — DIAZEPAM 5 MG/ML IJ SOLN
5.0000 mg | Freq: Once | INTRAMUSCULAR | Status: AC
  Administered 2012-09-15: 5 mg via INTRAVENOUS
  Filled 2012-09-15: qty 2

## 2012-09-15 MED ORDER — DEXTROSE 5 % IV SOLN
1.0000 g | Freq: Once | INTRAVENOUS | Status: AC
  Administered 2012-09-15: 1 g via INTRAVENOUS
  Filled 2012-09-15: qty 10

## 2012-09-15 MED ORDER — SODIUM CHLORIDE 0.9 % IV BOLUS (SEPSIS)
1000.0000 mL | Freq: Once | INTRAVENOUS | Status: AC
  Administered 2012-09-15: 1000 mL via INTRAVENOUS

## 2012-09-15 MED ORDER — FENTANYL CITRATE 0.05 MG/ML IJ SOLN
50.0000 ug | INTRAMUSCULAR | Status: DC | PRN
  Administered 2012-09-15 (×2): 50 ug via INTRAVENOUS
  Filled 2012-09-15 (×2): qty 2

## 2012-09-15 NOTE — ED Notes (Signed)
ZOX:[id number]<AV> Expected date:09/15/12<BR> Expected time: 7:50 AM<BR> Means of arrival:Ambulance<BR> Comments:<BR> Fall

## 2012-09-15 NOTE — ED Notes (Signed)
Spoke with radiology. No films taken for this pt for this visit.

## 2012-09-15 NOTE — ED Notes (Signed)
Pt reports hx of stroke, uses chair to get in/out of bed. N/V x3 times over past week

## 2012-09-15 NOTE — ED Notes (Signed)
Per ems: pt from home, fell this morning while husband was helping pt ambulate to bathroom. Reports back pain x3 days, denies any new pain from fall. Ems found pt on bathroom floor. C/o urinary retention and constipation x1 week. Reports pain with urination. Hx of cva, DM, HTN, MI. bp 188/110, pulse 88, respirations 22. CBG 127

## 2012-09-15 NOTE — ED Notes (Signed)
Bedside report received from previous RN 

## 2012-09-15 NOTE — ED Provider Notes (Addendum)
History     CSN: [id number]  Arrival date & time 09/15/12  4098   First MD Initiated Contact with Patient 09/15/12 0800      Chief Complaint  Patient presents with  . Fall  . Urinary Retention  . Constipation    (Consider location/radiation/quality/duration/timing/severity/associated sxs/prior treatment) HPI Comments: Patient with multiple medical problems including insulin-dependent diabetes, history of stroke and status post carotid endarterectomy, hypertension and prior back problems reported to be related to sciatica, reports generalized nausea and occasional vomiting over the past week. No emesis in the last couple of days. She reports approximately 4 days ago she began developing worsening lower back pain without radiation and endorses gradually worsening proximal lower extremity weakness. 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 She has a sore area on her lower back. She also has a prior history of a tailbone fracture after a fall many years ago. She is a former smoker quit 2 years ago. She denies prior history of being in DKA. Her prior care physician is Dr. Corbin Ade. No specific medications were taken for her various symptoms prior to arrival. Spouse has been helping her stand up and ambulate to the bathroom. However this morning after urinating, she had difficulty standing again and fell forward without specific injury. She denies head injury LOC, neck pain, shortness of breath that is worse than baseline. Spouse was unable to get her up and therefore EMS had to be called. She is brought here to [facility] emergency department. No CP, pleurisy, cough, fevers   Patient is a 57 y.o. female presenting with fall and constipation. The history is provided by the patient, the spouse and medical records.  Fall Associated symptoms include nausea and vomiting. Pertinent negatives include no fever and no headaches.    Constipation  Associated symptoms include nausea and vomiting. Pertinent negatives include no fever, no diarrhea, no vaginal bleeding, no headaches and no rash.    Past Medical History  Diagnosis Date  . HYPERLIPIDEMIA   . HYPERTENSION   . CAD, NATIVE VESSEL   . PVD   . DM   . COPD   . Edema   . H/O carotid endarterectomy   . Stroke     Past Surgical History  Procedure Date  . Eye surgery   . Carotid endarterectomy     History reviewed. No pertinent family history.  History  Substance Use Topics  . Smoking status: Former Games developer  . Smokeless tobacco: Not on file  . Alcohol Use: No    OB History    Grav Para Term Preterm Abortions TAB SAB Ect Mult Living                  Review of Systems  Constitutional: Negative for fever and chills.  HENT: Negative for congestion, neck pain and neck stiffness.   Respiratory: Negative for shortness of breath and wheezing.   Gastrointestinal: Positive for nausea, vomiting and constipation. Negative for diarrhea.  Genitourinary: Positive for difficulty urinating. Negative for dysuria, flank pain and vaginal bleeding.  Musculoskeletal: Positive for back pain.  Skin: Negative for rash.  Neurological: Positive for weakness. Negative for syncope and headaches.  All other systems reviewed and are negative.    Allergies  Epinephrine  Home Medications   Current Outpatient Rx  Name  Route  Sig  Dispense  Refill  . ACETAMINOPHEN 500 MG PO TABS   Oral  Take 500 mg by mouth every 6 (six) hours as needed. PAIN         . ASPIRIN EC 81 MG PO TBEC   Oral   Take 81 mg by mouth daily.         . FUROSEMIDE 40 MG PO TABS   Oral   Take 1 tablet (40 mg total) by mouth daily.   90 tablet   4   . INSULIN ASPART 100 UNIT/ML [place] SOLN   Subcutaneous   Inject 20-60 Units into the skin 3 (three) times daily before meals. DEPENDS ON WHAT SHE IS GOING TO EAT         . INSULIN GLARGINE 100 UNIT/ML  SOLN   Subcutaneous   Inject  100 Units into the skin daily.         Marland Kitchen METOPROLOL TARTRATE 25 MG PO TABS      TAKE 1 TABLET BY MOUTH 2 TIMES DAILY.   180 tablet   0   . OLMESARTAN MEDOXOMIL 20 MG PO TABS   Oral   Take 1 tablet (20 mg total) by mouth daily.   90 tablet   3   . PREGABALIN 75 MG PO CAPS   Oral   Take 75 mg by mouth daily.         . TRAMADOL HCL 50 MG PO TABS   Oral   Take 50 mg by mouth every 6 (six) hours as needed. PAIN           BP 165/67  Pulse 89  Temp 98.3 F (36.8 C) (Oral)  Ht 5' 3.5" (1.613 m)  Wt 300 lb (136.079 kg)  BMI 52.31 kg/m2  SpO2 93%  Physical Exam  Nursing note and vitals reviewed. Constitutional: She appears well-developed and well-nourished.  HENT:  Head: Normocephalic and atraumatic.  Cardiovascular: Normal rate and regular rhythm.   Pulses:      Radial pulses are 2+ on the right side, and 2+ on the left side.       Dorsalis pedis pulses are 1+ on the right side, and 1+ on the left side.  Pulmonary/Chest: Effort normal. No respiratory distress. She has no wheezes.  Abdominal: Soft.  Genitourinary: Rectal exam shows no tenderness and anal tone normal.       Chaperone present  Musculoskeletal: She exhibits edema and tenderness.       Lumbar back: She exhibits tenderness, bony tenderness, pain and spasm. She exhibits no deformity, no laceration and normal pulse.       Stage 1-2 ulcerative process with central area of hardened scar tissue or sebaceous type fluid.    Neurological: She is alert. She displays no tremor. A sensory deficit is present. No cranial nerve deficit. She exhibits normal muscle tone. GCS eye subscore is 4. GCS verbal subscore is 5. GCS motor subscore is 6.  Reflex Scores:      Patellar reflexes are 3+ on the right side and 3+ on the left side.      5/5 plantar and dorsi flexion of lower legs.  Hip abduction and adduction is weaker, 4/5.  Skin: Skin is warm and dry.       Skin of lower extremities are very dry, flaky, generalized  erythema, warm, but not hot to touch, no discharge or drainage.    ED Course  Procedures (including critical care time)  CRITICAL CARE Performed by: Lear Ng.   Total critical care time: 35 min  Critical care time was exclusive of  separately billable procedures and treating other patients.  Critical care was necessary to treat or prevent imminent or life-threatening deterioration.  Critical care was time spent personally by me on the following activities: development of treatment plan with patient and/or surrogate as well as nursing, discussions with consultants, evaluation of patient's response to treatment, examination of patient, obtaining history from patient or surrogate, ordering and performing treatments and interventions, ordering and review of laboratory studies, ordering and review of radiographic studies, pulse oximetry and re-evaluation of patient's condition.   Labs Reviewed  CBC WITH DIFFERENTIAL - Abnormal; Notable for the following:    WBC 13.2 (*)     Neutrophils Relative 87 (*)     Neutro Abs 11.4 (*)     Lymphocytes Relative 6 (*)     All other components within normal limits  BASIC METABOLIC PANEL - Abnormal; Notable for the following:    Sodium 122 (*)     Chloride 84 (*)     Glucose, Bld 151 (*)     BUN 37 (*)     Creatinine, Ser 1.32 (*)     GFR calc non Af Amer 44 (*)     GFR calc Af Amer 51 (*)     All other components within normal limits  APTT - Abnormal; Notable for the following:    aPTT 40 (*)     All other components within normal limits  URINALYSIS, ROUTINE W REFLEX MICROSCOPIC - Abnormal; Notable for the following:    APPearance CLOUDY (*)     Hgb urine dipstick LARGE (*)     Protein, ur 30 (*)     Urobilinogen, UA 2.0 (*)     Leukocytes, UA LARGE (*)     All other components within normal limits  POCT I-STAT, CHEM 8 - Abnormal; Notable for the following:    Sodium 127 (*)     Chloride 93 (*)     BUN 36 (*)     Creatinine, Ser  1.40 (*)     Glucose, Bld 153 (*)     Calcium, Ion 1.10 (*)     All other components within normal limits  URINE MICROSCOPIC-ADD ON - Abnormal; Notable for the following:    Squamous Epithelial / LPF FEW (*)     Bacteria, UA MANY (*)     All other components within normal limits  PROTIME-INR   No results found.   No diagnosis found.  ra sat is 93% and I interpret to be adequate.  11:23 AM Continue back pain and likely spasms.  Need to r/o cord compression or cauda equina with 500 ml of urine post void residual.  Na is 122, likely due to hyperglycemia as well as N/V and poor oral intake and mild dehydration.  IVF's will be continued.  Pt is not frankly confused.  Mild metabolic encephalopathy may be present and is grounds for medical admission.  However MRI will still need to be performed.  No fever, no prior back surgeries, thus no  Contrast required.  Anion gap is 14.  No evidence of DKA.  Mild UTI is also present, will start IV abx as well.     1:34 PM Do to patient's body habitus, she does not fit MRI machine. Patient continues to have severe discomfort. I spoke again to radiologist, Dr. Chilton Si who reports that imaging looking for possible spinal cord injury or cauda equina syndrome would not be adequate with CT scanning. They'll contact neurosurgery for consultation.  2:49 PM Still awaiting call back from [facility].    3:07 PM Spoke to Dr. Danielle Dess who agrees pt requires emergent MRI.  He suggested [facility] as having a larger MRI scanner.  Pt is listed at 300 lbs, she reports she is 5'4".  3:28 PM I spoke to Dr. Janyth Contes, [facility] at [facility] who reports that their MRI is not open MRI.  My plan now is to contact [facility], and have our MRI technicians discuss with the [facility] MRI technicians to see if their machine is able to accommodate patient.  Pt's abdomen was not able to pass through our MRI when passing feet first, and when going head first, couldn't pass arms and shoulders through.   Weight was able to be accommodated.     4:53 PM [facility] has informed us that their MRI machine is larger and should be able to accommodate the patient's body habitus.  5:21 PM Spoke again to Dr. Eddie Dibbles who is ok with pt coming here.  Dr. Rolla Etienne is accepting in the ED at [facility].   MDM  Pt with N/V and appears quite dehydrated, will need IVF's.  Pt with progressive low back pain with worsening weakness, difficulty urinating, likely needs MRI and transfer to [facility] if cauda equina symptoms are definitive.           Gavin Pound. Oletta Lamas, MD 09/15/12 1449  Gavin Pound. Oletta Lamas, MD 09/15/12 1609  Gavin Pound. Oletta Lamas, MD 09/15/12 1653  Gavin Pound. Oletta Lamas, MD 09/15/12 1721  Gavin Pound. Oletta Lamas, MD 09/15/12 2133

## 2012-09-15 NOTE — ED Notes (Signed)
MRI not completed, pt unable to fit into MRI. MD made aware

## 2012-09-15 NOTE — ED Notes (Signed)
Report given to [facility] transport. 

## 2012-09-17 LAB — URINE CULTURE: Colony Count: >=100000

## 2013-01-23 ENCOUNTER — Other Ambulatory Visit: Payer: Self-pay | Admitting: Cardiology

## 2013-02-19 ENCOUNTER — Encounter: Payer: Self-pay | Admitting: Cardiology

## 2013-03-07 ENCOUNTER — Other Ambulatory Visit: Payer: Self-pay | Admitting: Cardiology

## 2013-07-04 ENCOUNTER — Other Ambulatory Visit: Payer: Self-pay | Admitting: Geriatric Medicine

## 2013-07-04 DIAGNOSIS — R109 Unspecified abdominal pain: Secondary | ICD-10-CM

## 2013-07-15 ENCOUNTER — Ambulatory Visit
Admission: RE | Admit: 2013-07-15 | Discharge: 2013-07-15 | Disposition: A | Discharge status: Home / Self Care | Payer: Managed Care, Other (non HMO) | Source: Ambulatory Visit | Attending: Geriatric Medicine | Admitting: Geriatric Medicine

## 2013-07-15 DIAGNOSIS — R109 Unspecified abdominal pain: Secondary | ICD-10-CM

## 2013-09-10 ENCOUNTER — Inpatient Hospital Stay (HOSPITAL_COMMUNITY)
Admission: EM | Admit: 2013-09-10 | Discharge: 2013-09-15 | DRG: 193 | Disposition: A | Discharge status: Home Health | Payer: Managed Care, Other (non HMO) | Attending: Internal Medicine | Admitting: Internal Medicine

## 2013-09-10 ENCOUNTER — Emergency Department (HOSPITAL_COMMUNITY): Payer: Managed Care, Other (non HMO)

## 2013-09-10 ENCOUNTER — Encounter (HOSPITAL_COMMUNITY): Payer: Self-pay | Admitting: Emergency Medicine

## 2013-09-10 DIAGNOSIS — Z7401 Bed confinement status: Secondary | ICD-10-CM

## 2013-09-10 DIAGNOSIS — L02419 Cutaneous abscess of limb, unspecified: Secondary | ICD-10-CM | POA: Diagnosis present

## 2013-09-10 DIAGNOSIS — E1129 Type 2 diabetes mellitus with other diabetic kidney complication: Secondary | ICD-10-CM | POA: Diagnosis present

## 2013-09-10 DIAGNOSIS — Z8673 Personal history of transient ischemic attack (TIA), and cerebral infarction without residual deficits: Secondary | ICD-10-CM

## 2013-09-10 DIAGNOSIS — Z794 Long term (current) use of insulin: Secondary | ICD-10-CM

## 2013-09-10 DIAGNOSIS — I10 Essential (primary) hypertension: Secondary | ICD-10-CM

## 2013-09-10 DIAGNOSIS — Z66 Do not resuscitate: Secondary | ICD-10-CM | POA: Diagnosis present

## 2013-09-10 DIAGNOSIS — Z87891 Personal history of nicotine dependence: Secondary | ICD-10-CM

## 2013-09-10 DIAGNOSIS — N183 Chronic kidney disease, stage 3 unspecified: Secondary | ICD-10-CM | POA: Diagnosis present

## 2013-09-10 DIAGNOSIS — R609 Edema, unspecified: Secondary | ICD-10-CM

## 2013-09-10 DIAGNOSIS — I119 Hypertensive heart disease without heart failure: Secondary | ICD-10-CM | POA: Diagnosis present

## 2013-09-10 DIAGNOSIS — Z79899 Other long term (current) drug therapy: Secondary | ICD-10-CM

## 2013-09-10 DIAGNOSIS — E785 Hyperlipidemia, unspecified: Secondary | ICD-10-CM | POA: Diagnosis present

## 2013-09-10 DIAGNOSIS — I509 Heart failure, unspecified: Secondary | ICD-10-CM | POA: Diagnosis present

## 2013-09-10 DIAGNOSIS — Z7982 Long term (current) use of aspirin: Secondary | ICD-10-CM

## 2013-09-10 DIAGNOSIS — I5030 Unspecified diastolic (congestive) heart failure: Secondary | ICD-10-CM

## 2013-09-10 DIAGNOSIS — L8993 Pressure ulcer of unspecified site, stage 3: Secondary | ICD-10-CM | POA: Diagnosis present

## 2013-09-10 DIAGNOSIS — E119 Type 2 diabetes mellitus without complications: Secondary | ICD-10-CM | POA: Diagnosis present

## 2013-09-10 DIAGNOSIS — L89609 Pressure ulcer of unspecified heel, unspecified stage: Secondary | ICD-10-CM | POA: Diagnosis present

## 2013-09-10 DIAGNOSIS — I5033 Acute on chronic diastolic (congestive) heart failure: Secondary | ICD-10-CM | POA: Diagnosis present

## 2013-09-10 DIAGNOSIS — Z6841 Body Mass Index (BMI) 40.0 and over, adult: Secondary | ICD-10-CM

## 2013-09-10 DIAGNOSIS — I739 Peripheral vascular disease, unspecified: Secondary | ICD-10-CM | POA: Diagnosis present

## 2013-09-10 DIAGNOSIS — I2510 Atherosclerotic heart disease of native coronary artery without angina pectoris: Secondary | ICD-10-CM | POA: Diagnosis present

## 2013-09-10 DIAGNOSIS — J189 Pneumonia, unspecified organism: Principal | ICD-10-CM | POA: Diagnosis present

## 2013-09-10 DIAGNOSIS — E1169 Type 2 diabetes mellitus with other specified complication: Secondary | ICD-10-CM | POA: Diagnosis present

## 2013-09-10 DIAGNOSIS — I129 Hypertensive chronic kidney disease with stage 1 through stage 4 chronic kidney disease, or unspecified chronic kidney disease: Secondary | ICD-10-CM | POA: Diagnosis present

## 2013-09-10 DIAGNOSIS — E871 Hypo-osmolality and hyponatremia: Secondary | ICD-10-CM | POA: Diagnosis present

## 2013-09-10 DIAGNOSIS — J4489 Other specified chronic obstructive pulmonary disease: Secondary | ICD-10-CM | POA: Diagnosis present

## 2013-09-10 DIAGNOSIS — J449 Chronic obstructive pulmonary disease, unspecified: Secondary | ICD-10-CM | POA: Diagnosis present

## 2013-09-10 DIAGNOSIS — L0390 Cellulitis, unspecified: Secondary | ICD-10-CM

## 2013-09-10 HISTORY — DX: Heart failure, unspecified: I50.9

## 2013-09-10 LAB — GLUCOSE, CAPILLARY
Glucose-Capillary: 443 mg/dL — ABNORMAL HIGH (ref 70–99)
Glucose-Capillary: 328 mg/dL — ABNORMAL HIGH (ref 70–99)

## 2013-09-10 LAB — BASIC METABOLIC PANEL
CO2: 27 mEq/L (ref 19–32)
Chloride: 86 mEq/L — ABNORMAL LOW (ref 96–112)
Creatinine, Ser: 1.74 mg/dL — ABNORMAL HIGH (ref 0.50–1.10)
GFR calc Af Amer: 36 mL/min — ABNORMAL LOW (ref >90)
GFR calc non Af Amer: 31 mL/min — ABNORMAL LOW (ref >90)
Sodium: 127 mEq/L — ABNORMAL LOW (ref 137–147)

## 2013-09-10 LAB — CBC
Hemoglobin: 11.7 g/dL — ABNORMAL LOW (ref 12.0–15.0)
MCH: 28.3 pg (ref 26.0–34.0)
MCHC: 34.4 g/dL (ref 30.0–36.0)
Platelets: 239 10*3/uL (ref 150–400)
RBC: 4.13 MIL/uL (ref 3.87–5.11)

## 2013-09-10 LAB — INFLUENZA PANEL BY PCR (TYPE A & B)
Influenza A By PCR: NEGATIVE
Influenza B By PCR: NEGATIVE

## 2013-09-10 LAB — OSMOLALITY: Osmolality: 304 mOsm/kg — ABNORMAL HIGH (ref 275–300)

## 2013-09-10 LAB — TROPONIN I
Troponin I: <0.3 ng/mL (ref <0.30)
Troponin I: <0.3 ng/mL (ref <0.30)

## 2013-09-10 LAB — PRO B NATRIURETIC PEPTIDE: Pro B Natriuretic peptide (BNP): 4050 pg/mL — ABNORMAL HIGH (ref 0–125)

## 2013-09-10 MED ORDER — DEXTROSE 5 % IV SOLN
1.0000 g | Freq: Once | INTRAVENOUS | Status: AC
  Administered 2013-09-10: 1 g via INTRAVENOUS
  Filled 2013-09-10: qty 10

## 2013-09-10 MED ORDER — ALBUTEROL SULFATE (2.5 MG/3ML) 0.083% IN NEBU
5.0000 mg | INHALATION_SOLUTION | Freq: Once | RESPIRATORY_TRACT | Status: AC
  Administered 2013-09-10: 5 mg via RESPIRATORY_TRACT
  Filled 2013-09-10: qty 6

## 2013-09-10 MED ORDER — ONDANSETRON HCL 4 MG/2ML IJ SOLN
4.0000 mg | Freq: Once | INTRAMUSCULAR | Status: AC
  Administered 2013-09-10: 4 mg via INTRAVENOUS
  Filled 2013-09-10: qty 2

## 2013-09-10 MED ORDER — ALBUTEROL SULFATE (2.5 MG/3ML) 0.083% IN NEBU: 2.5000 mg | INHALATION_SOLUTION | RESPIRATORY_TRACT | Status: DC | PRN

## 2013-09-10 MED ORDER — SODIUM CHLORIDE 0.9 % IJ SOLN
3.0000 mL | INTRAMUSCULAR | Status: DC | PRN
  Administered 2013-09-14: 11:00:00 3 mL via INTRAVENOUS

## 2013-09-10 MED ORDER — IPRATROPIUM-ALBUTEROL 0.5-2.5 (3) MG/3ML IN SOLN
3.0000 mL | RESPIRATORY_TRACT | Status: DC
  Administered 2013-09-10: 21:00:00 3 mL via RESPIRATORY_TRACT
  Filled 2013-09-10: qty 3

## 2013-09-10 MED ORDER — VANCOMYCIN HCL 10 G IV SOLR
1500.0000 mg | INTRAVENOUS | Status: DC
  Administered 2013-09-10 – 2013-09-12 (×3): 1500 mg via INTRAVENOUS
  Filled 2013-09-10 (×4): qty 1500

## 2013-09-10 MED ORDER — ASPIRIN EC 81 MG PO TBEC
81.0000 mg | DELAYED_RELEASE_TABLET | Freq: Every day | ORAL | Status: DC
  Administered 2013-09-11 – 2013-09-15 (×5): 81 mg via ORAL
  Filled 2013-09-10 (×5): qty 1

## 2013-09-10 MED ORDER — DEXTROSE 5 % IV SOLN: 500.0000 mg | Freq: Once | INTRAVENOUS | Status: DC

## 2013-09-10 MED ORDER — PREGABALIN 25 MG PO CAPS
75.0000 mg | ORAL_CAPSULE | Freq: Every day | ORAL | Status: DC
  Administered 2013-09-11 – 2013-09-15 (×5): 75 mg via ORAL
  Filled 2013-09-10 (×6): qty 3

## 2013-09-10 MED ORDER — IPRATROPIUM BROMIDE 0.02 % IN SOLN: 0.5000 mg | Freq: Four times a day (QID) | RESPIRATORY_TRACT | Status: DC

## 2013-09-10 MED ORDER — INSULIN ASPART 100 UNIT/ML ~~LOC~~ SOLN
10.0000 [IU] | Freq: Once | SUBCUTANEOUS | Status: AC
  Administered 2013-09-10: 10 [IU] via INTRAVENOUS
  Filled 2013-09-10: qty 1

## 2013-09-10 MED ORDER — DEXTROSE 5 % IV SOLN
500.0000 mg | INTRAVENOUS | Status: DC
  Administered 2013-09-10 – 2013-09-12 (×3): 500 mg via INTRAVENOUS
  Filled 2013-09-10 (×4): qty 500

## 2013-09-10 MED ORDER — INSULIN GLARGINE 100 UNIT/ML ~~LOC~~ SOLN
61.0000 [IU] | Freq: Two times a day (BID) | SUBCUTANEOUS | Status: DC
  Administered 2013-09-10 – 2013-09-15 (×10): 61 [IU] via SUBCUTANEOUS
  Filled 2013-09-10 (×11): qty 0.61

## 2013-09-10 MED ORDER — SODIUM CHLORIDE 0.9 % IJ SOLN
3.0000 mL | Freq: Two times a day (BID) | INTRAMUSCULAR | Status: DC
  Administered 2013-09-10 – 2013-09-15 (×8): 3 mL via INTRAVENOUS

## 2013-09-10 MED ORDER — CLONIDINE HCL 0.1 MG PO TABS
0.1000 mg | ORAL_TABLET | Freq: Every day | ORAL | Status: DC
  Administered 2013-09-11 – 2013-09-14 (×4): 0.1 mg via ORAL
  Filled 2013-09-10 (×5): qty 1

## 2013-09-10 MED ORDER — INSULIN ASPART 100 UNIT/ML ~~LOC~~ SOLN
0.0000 [IU] | Freq: Three times a day (TID) | SUBCUTANEOUS | Status: DC
  Administered 2013-09-11: 15 [IU] via SUBCUTANEOUS
  Administered 2013-09-11: 5 [IU] via SUBCUTANEOUS
  Administered 2013-09-11: 12:00:00 11 [IU] via SUBCUTANEOUS
  Administered 2013-09-12 – 2013-09-13 (×3): 5 [IU] via SUBCUTANEOUS
  Administered 2013-09-13: 13:00:00 2 [IU] via SUBCUTANEOUS
  Administered 2013-09-13: 07:00:00 3 [IU] via SUBCUTANEOUS
  Administered 2013-09-14: 2 [IU] via SUBCUTANEOUS
  Administered 2013-09-14 – 2013-09-15 (×2): 5 [IU] via SUBCUTANEOUS

## 2013-09-10 MED ORDER — ALBUTEROL SULFATE (2.5 MG/3ML) 0.083% IN NEBU: 2.5000 mg | INHALATION_SOLUTION | Freq: Four times a day (QID) | RESPIRATORY_TRACT | Status: DC

## 2013-09-10 MED ORDER — ENOXAPARIN SODIUM 40 MG/0.4ML ~~LOC~~ SOLN
40.0000 mg | SUBCUTANEOUS | Status: DC
  Administered 2013-09-10 – 2013-09-13 (×4): 40 mg via SUBCUTANEOUS
  Filled 2013-09-10 (×5): qty 0.4

## 2013-09-10 MED ORDER — FUROSEMIDE 10 MG/ML IJ SOLN
20.0000 mg | Freq: Two times a day (BID) | INTRAMUSCULAR | Status: AC
  Administered 2013-09-11 (×2): 20 mg via INTRAVENOUS
  Filled 2013-09-10: qty 2

## 2013-09-10 MED ORDER — MORPHINE SULFATE 4 MG/ML IJ SOLN
4.0000 mg | Freq: Once | INTRAMUSCULAR | Status: AC
  Administered 2013-09-10: 4 mg via INTRAVENOUS
  Filled 2013-09-10: qty 1

## 2013-09-10 MED ORDER — TRAMADOL HCL 50 MG PO TABS: 50.0000 mg | ORAL_TABLET | Freq: Four times a day (QID) | ORAL | Status: DC | PRN

## 2013-09-10 MED ORDER — ONDANSETRON HCL 4 MG/2ML IJ SOLN: 4.0000 mg | Freq: Three times a day (TID) | INTRAMUSCULAR | Status: AC | PRN

## 2013-09-10 MED ORDER — METOPROLOL TARTRATE 25 MG PO TABS
25.0000 mg | ORAL_TABLET | Freq: Two times a day (BID) | ORAL | Status: DC
  Administered 2013-09-10 – 2013-09-15 (×10): 25 mg via ORAL
  Filled 2013-09-10 (×12): qty 1

## 2013-09-10 MED ORDER — FUROSEMIDE 10 MG/ML IJ SOLN: 20.0000 mg | Freq: Two times a day (BID) | INTRAMUSCULAR | Status: DC

## 2013-09-10 MED ORDER — SODIUM CHLORIDE 0.9 % IV SOLN: 250.0000 mL | INTRAVENOUS | Status: DC | PRN

## 2013-09-10 MED ORDER — IPRATROPIUM-ALBUTEROL 0.5-2.5 (3) MG/3ML IN SOLN
3.0000 mL | Freq: Four times a day (QID) | RESPIRATORY_TRACT | Status: DC
Start: 2013-09-11 — End: 2013-09-12
  Administered 2013-09-11 (×4): 3 mL via RESPIRATORY_TRACT
  Filled 2013-09-10 (×12): qty 3

## 2013-09-10 MED ORDER — SODIUM CHLORIDE 0.9 % IV BOLUS (SEPSIS)
1000.0000 mL | Freq: Once | INTRAVENOUS | Status: AC
  Administered 2013-09-10: 1000 mL via INTRAVENOUS

## 2013-09-10 MED ORDER — GUAIFENESIN ER 600 MG PO TB12
600.0000 mg | ORAL_TABLET | Freq: Two times a day (BID) | ORAL | Status: DC
  Administered 2013-09-10 – 2013-09-15 (×10): 600 mg via ORAL
  Filled 2013-09-10 (×11): qty 1

## 2013-09-10 NOTE — ED Provider Notes (Signed)
CSN: [id number]     Arrival date & time 09/10/13  1259 History   First MD Initiated Contact with Patient 09/10/13 1338     Chief Complaint  Patient presents with  . Shortness of Breath   (Consider location/radiation/quality/duration/timing/severity/associated sxs/prior Treatment) HPI Comments: Patient is a 57 year old female with a past medical history of diabetes, previous stroke, COPD, and hypertension who presents with SOB and chest congestion for the past 5 days. Patient is concerned she may have pneumonia. Symptoms are exacerbated with any exertion. No alleviating factors.    Patient is a 57 y.o. female presenting with shortness of breath.  Shortness of Breath Severity:  Moderate Onset quality:  Gradual Duration:  5 days Timing:  Constant Progression:  Worsening Chronicity:  New Context: URI   Context: not known allergens and not smoke exposure   Relieved by:  Nothing Worsened by:  Exertion Ineffective treatments:  None tried Associated symptoms: cough   Associated symptoms: no abdominal pain, no chest pain, no fever, no neck pain and no vomiting   Cough:    Cough characteristics:  Non-productive   Sputum characteristics:  Unable to specify   Severity:  Moderate   Onset quality:  Gradual   Duration:  5 days   Timing:  Constant   Progression:  Worsening   Chronicity:  New Risk factors: no recent alcohol use, no family hx of DVT and no hx of PE/DVT     Past Medical History  Diagnosis Date  . HYPERLIPIDEMIA   . HYPERTENSION   . CAD, NATIVE VESSEL   . PVD   . DM   . COPD   . Edema   . H/O carotid endarterectomy   . Stroke    Past Surgical History  Procedure Laterality Date  . Eye surgery    . Carotid endarterectomy     No family history on file. History  Substance Use Topics  . Smoking status: Former Games developer  . Smokeless tobacco: Not on file  . Alcohol Use: No   OB History   Grav Para Term Preterm Abortions TAB SAB Ect Mult Living                  Review of Systems  Constitutional: Positive for fatigue. Negative for fever and chills.  HENT: Negative for trouble swallowing.   Eyes: Negative for visual disturbance.  Respiratory: Positive for cough and shortness of breath.   Cardiovascular: Negative for chest pain and palpitations.  Gastrointestinal: Negative for nausea, vomiting, abdominal pain and diarrhea.  Genitourinary: Negative for dysuria and difficulty urinating.  Musculoskeletal: Negative for arthralgias and neck pain.  Skin: Negative for color change.  Neurological: Negative for dizziness and weakness.  Psychiatric/Behavioral: Negative for dysphoric mood.    Allergies  Epinephrine  Home Medications   Current Outpatient Rx  Name  Route  Sig  Dispense  Refill  . acetaminophen (TYLENOL) 500 MG tablet   Oral   Take 500 mg by mouth every 6 (six) hours as needed. PAIN         . aspirin EC 81 MG tablet   Oral   Take 81 mg by mouth daily.         Marland Kitchen BENICAR 20 MG tablet      TAKE 1 TABLET BY MOUTH EVERY DAY   90 tablet   3   . furosemide (LASIX) 40 MG tablet      TAKE 1 TABLET BY MOUTH EVERY DAY   90 tablet  4   . insulin aspart (NOVOLOG) 100 UNIT/ML injection   Subcutaneous   Inject 20-60 Units into the skin 3 (three) times daily before meals. DEPENDS ON WHAT SHE IS GOING TO EAT         . insulin glargine (LANTUS) 100 UNIT/ML injection   Subcutaneous   Inject 100 Units into the skin daily.         . metoprolol tartrate (LOPRESSOR) 25 MG tablet      TAKE 1 TABLET BY MOUTH 2 TIMES DAILY.   180 tablet   0   . pregabalin (LYRICA) 75 MG capsule   Oral   Take 75 mg by mouth daily.         . traMADol (ULTRAM) 50 MG tablet   Oral   Take 50 mg by mouth every 6 (six) hours as needed. PAIN          BP 177/66  Pulse 83  Temp(Src) 98.4 F (36.9 C) (Oral)  Resp 22  Ht 5\' 4"  (1.626 m)  Wt 250 lb (113.399 kg)  BMI 42.89 kg/m2  SpO2 94% Physical Exam  Nursing note and vitals  reviewed. Constitutional: She is oriented to person, place, and time. She appears well-developed and well-nourished. No distress.  HENT:  Head: Normocephalic and atraumatic.  Eyes: Conjunctivae and EOM are normal.  Neck: Normal range of motion.  Cardiovascular: Normal rate and regular rhythm.  Exam reveals no gallop and no friction rub.   No murmur heard. Pulmonary/Chest: Effort normal. She has wheezes. She has no rales. She exhibits no tenderness.  Wheezing and rhonchi noted in bilateral lung fields. Right>left.   Abdominal: Soft. She exhibits no distension. There is no tenderness. There is no rebound.  Musculoskeletal: Normal range of motion.  Neurological: She is alert and oriented to person, place, and time. Coordination normal.  Speech is goal-oriented. Moves limbs without ataxia.   Skin: Skin is warm and dry.  Psychiatric: She has a normal mood and affect. Her behavior is normal.    ED Course  Procedures (including critical care time) Labs Review Labs Reviewed  PRO B NATRIURETIC PEPTIDE - Abnormal; Notable for the following:    Pro B Natriuretic peptide (BNP) 5021.0 (*)    All other components within normal limits  CBC - Abnormal; Notable for the following:    WBC 10.9 (*)    Hemoglobin 11.7 (*)    HCT 34.0 (*)    All other components within normal limits  BASIC METABOLIC PANEL - Abnormal; Notable for the following:    Sodium 127 (*)    Chloride 86 (*)    Glucose, Bld 437 (*)    BUN 41 (*)    Creatinine, Ser 1.74 (*)    GFR calc non Af Amer 31 (*)    GFR calc Af Amer 36 (*)    All other components within normal limits  GLUCOSE, CAPILLARY - Abnormal; Notable for the following:    Glucose-Capillary 443 (*)    All other components within normal limits  TROPONIN I   Imaging Review Dg Chest 2 View  09/10/2013   CLINICAL DATA:  Chest pain, cough, congestion  EXAM: CHEST  2 VIEW  COMPARISON:  05/14/2010.  FINDINGS: Narrative increased density projects in the region of  right middle lobe and to a lesser extent right lower lobe. This finding partially silhouettes the right heart border. Cardiac silhouette is otherwise appears be within normal limits. There is mild prominence of the interstitial markings. No further  focal regions of consolidation no further focal infiltrates. The osseous structures unremarkable.  IMPRESSION: Right middle as well as possibly right lower lobe pneumonitis. There is also prominence of the interstitial markings may reflect an interstitial component. Pulmonary vascular congestion is also diagnostic consideration.   Electronically Signed   By: Salome Holmes M.D.   On: 09/10/2013 14:14    EKG Interpretation    Date/Time:  Wednesday September 10 2013 13:19:05 EST Ventricular Rate:  85 PR Interval:  170 QRS Duration: 134 QT Interval:  418 QTC Calculation: 497 R Axis:   -31 Text Interpretation:  Normal sinus rhythm Left axis deviation Left bundle branch block Abnormal ECG Confirmed by DELOS  MD, DOUGLAS ([id number]) on 09/10/2013 2:34:19 PM            MDM   1. CAP (community acquired pneumonia)     1:39 PM Labs and chest xray pending. EKG pending. Patient's oxygen saturation 94% with remaining vitals stable. Patient is afebrile.   4:16 PM Patient desaturates with any exertion. Patient will be admitted to Dr. Sharl Ma for CAP.    Emilia Beck, PA-C 09/10/13 [phone]

## 2013-09-10 NOTE — ED Provider Notes (Signed)
Medical screening examination/treatment/procedure(s) were performed by non-physician practitioner and as supervising physician I was immediately available for consultation/collaboration.  EKG Interpretation    Date/Time:  Wednesday September 10 2013 13:19:05 EST Ventricular Rate:  85 PR Interval:  170 QRS Duration: 134 QT Interval:  418 QTC Calculation: 497 R Axis:   -31 Text Interpretation:  Normal sinus rhythm Left axis deviation Left bundle branch block Abnormal ECG Confirmed by Malva Cogan  MD, Chevy Virgo ([id number]) on 09/10/2013 2:34:19 PM             Geoffery Lyons, MD 09/10/13 1625

## 2013-09-10 NOTE — H&P (Signed)
PCP:   [name], MD   Chief Complaint:  Shortness of breath  HPI:  57 year old female who  has a past medical history of HYPERLIPIDEMIA; HYPERTENSION; CAD, NATIVE VESSEL; PVD; DM; COPD; Edema; H/O carotid endarterectomy; and Stroke. Today presented to the ED with shortness of breath and chest congestion for past 5 days. Patient also complains of right-sided chest pain, patient is chronically bedbound and is morbidly obese. She has been having gradual worsening of shortness of breath. She says she was able to cough and had clear phlegm. She admits to nausea but no vomiting. No diarrhea. She also denies fever. Patient has chronic leg wounds and has also been experiencing left leg pain over the past 2 days. She has not noticed that the left leg has been more red and swollen.  Allergies:   Allergies  Allergen Reactions  . Epinephrine     Increased heart rate      Past Medical History  Diagnosis Date  . HYPERLIPIDEMIA   . HYPERTENSION   . CAD, NATIVE VESSEL   . PVD   . DM   . COPD   . Edema   . H/O carotid endarterectomy   . Stroke     Past Surgical History  Procedure Laterality Date  . Eye surgery    . Carotid endarterectomy      Prior to Admission medications   Medication Sig Start Date End Date Taking? Authorizing Provider  aspirin EC 81 MG tablet Take 81 mg by mouth daily.   Yes Historical Provider, MD  cloNIDine (CATAPRES) 0.1 MG tablet Take 0.1 mg by mouth daily.   Yes Historical Provider, MD  furosemide (LASIX) 40 MG tablet Take 40 mg by mouth every other day.   Yes Historical Provider, MD  insulin aspart (NOVOLOG) 100 UNIT/ML injection Inject 20-60 Units into the skin 3 (three) times daily before meals. DEPENDS ON WHAT SHE IS GOING TO EAT   Yes Historical Provider, MD  insulin glargine (LANTUS) 100 UNIT/ML injection Inject 61 Units into the skin 2 (two) times daily.    Yes Historical Provider, MD  metoprolol tartrate (LOPRESSOR) 25 MG tablet Take 25 mg by  mouth 2 (two) times daily.   Yes Historical Provider, MD  pregabalin (LYRICA) 75 MG capsule Take 75 mg by mouth daily.   Yes Historical Provider, MD  traMADol (ULTRAM) 50 MG tablet Take 50 mg by mouth every 6 (six) hours as needed. PAIN   Yes Historical Provider, MD    Social History:  reports that she has quit smoking. She does not have any smokeless tobacco history on file. She reports that she does not drink alcohol or use illicit drugs.  No family history on file.   All the positives are listed in BOLD  Review of Systems:  HEENT: Headache, blurred vision, runny nose, sore throat Neck: Hypothyroidism, hyperthyroidism,,lymphadenopathy Chest : Shortness of breath, history of COPD, Asthma Heart : Chest pain, history of coronary arterey disease GI:  Nausea, vomiting, diarrhea, constipation, GERD GU: Dysuria, urgency, frequency of urination, hematuria Neuro: Stroke, seizures, syncope Psych: Depression, anxiety, hallucinations   Physical Exam: Blood pressure 141/66, pulse 89, temperature 98.4 F (36.9 C), temperature source Oral, resp. rate 17, height 5\' 4"  (1.626 m), weight 113.399 kg (250 lb), SpO2 100.00%. Constitutional:   Patient is a morbidly obese female in no acute distress and cooperative with exam. Head: Normocephalic and atraumatic Mouth: Mucus membranes moist Eyes: PERRL, EOMI, conjunctivae normal Neck: Supple, No Thyromegaly Cardiovascular: RRR, S1 normal,  S2 normal Pulmonary/Chest: Bilateral rhonchi Abdominal: Soft. Non-tender, non-distended, bowel sounds are normal, no masses, organomegaly, or guarding present.  Neurological: A&O x3, Strenght is normal and symmetric bilaterally, cranial nerve II-XII are grossly intact, no focal motor deficit, sensory intact to light touch bilaterally.  Extremities : 2+ edema bilaterally in the lower extremities, she has increased erythema and warmth and tenderness to palpation in the left leg. The erythema extends to the middle of the  left leg   Labs on Admission:  Results for orders placed during the hospital encounter of 09/10/13 (from the past 48 hour(s))  GLUCOSE, CAPILLARY     Status: Abnormal   Collection Time    09/10/13  1:50 PM      Result Value Range   Glucose-Capillary 443 (*) 70 - 99 mg/dL   Comment 1 Documented in Chart     Comment 2 Notify RN    PRO B NATRIURETIC PEPTIDE     Status: Abnormal   Collection Time    09/10/13  2:41 PM      Result Value Range   Pro B Natriuretic peptide (BNP) 5021.0 (*) 0 - 125 pg/mL  CBC     Status: Abnormal   Collection Time    09/10/13  2:41 PM      Result Value Range   WBC 10.9 (*) 4.0 - 10.5 K/uL   RBC 4.13  3.87 - 5.11 MIL/uL   Hemoglobin 11.7 (*) 12.0 - 15.0 g/dL   HCT 40.9 (*) 81.1 - 91.4 %   MCV 82.3  78.0 - 100.0 fL   MCH 28.3  26.0 - 34.0 pg   MCHC 34.4  30.0 - 36.0 g/dL   RDW 78.2  95.6 - 21.3 %   Platelets 239  150 - 400 K/uL  TROPONIN I     Status: None   Collection Time    09/10/13  2:41 PM      Result Value Range   Troponin I <0.30  <0.30 ng/mL   Comment:            Due to the release kinetics of cTnI,     a negative result within the first hours     of the onset of symptoms does not rule out     myocardial infarction with certainty.     If myocardial infarction is still suspected,     repeat the test at appropriate intervals.  BASIC METABOLIC PANEL     Status: Abnormal   Collection Time    09/10/13  2:41 PM      Result Value Range   Sodium 127 (*) 137 - 147 mEq/L   Comment: Please note change in reference range.   Potassium 4.8  3.7 - 5.3 mEq/L   Comment: Please note change in reference range.   Chloride 86 (*) 96 - 112 mEq/L   CO2 27  19 - 32 mEq/L   Glucose, Bld 437 (*) 70 - 99 mg/dL   BUN 41 (*) 6 - 23 mg/dL   Creatinine, Ser 0.86 (*) 0.50 - 1.10 mg/dL   Calcium 9.0  8.4 - 57.8 mg/dL   GFR calc non Af Amer 31 (*) >90 mL/min   GFR calc Af Amer 36 (*) >90 mL/min   Comment: (NOTE)     The eGFR has been calculated using the CKD EPI  equation.     This calculation has not been validated in all clinical situations.     eGFR's persistently <90 mL/min signify possible  Chronic Kidney     Disease.    Radiological Exams on Admission: Dg Chest 2 View  09/10/2013   CLINICAL DATA:  Chest pain, cough, congestion  EXAM: CHEST  2 VIEW  COMPARISON:  05/14/2010.  FINDINGS: Narrative increased density projects in the region of right middle lobe and to a lesser extent right lower lobe. This finding partially silhouettes the right heart border. Cardiac silhouette is otherwise appears be within normal limits. There is mild prominence of the interstitial markings. No further focal regions of consolidation no further focal infiltrates. The osseous structures unremarkable.  IMPRESSION: Right middle as well as possibly right lower lobe pneumonitis. There is also prominence of the interstitial markings may reflect an interstitial component. Pulmonary vascular congestion is also diagnostic consideration.   Electronically Signed   By: Salome Holmes M.D.   On: 09/10/2013 14:14    Assessment/Plan Active Problems:   CAP (community acquired pneumonia)   Pneumonia CHf CKD Hyponatremia Cellulitis  Pneumonia- chest x-ray shows the patient has right middle and lower lobe pneumonitis. Patient has been started on vancomycin for cellulitis which will also cover for pneumonia. We'll also obtain strep pneumo antigen and Legionella urine antigen. Blood cultures x2 will be obtained  Cellulitis- patient will be started on vancomycin. We'll also get wound care consult as patient has right heel wound.  CHF- patient has history of diastolic dysfunction, her BNP is elevated to 5091. We'll start Lasix 20 g IV every 12 hours x2 doses. Check BMP in the morning, as patient has C. KD, we'll need to monitor her renal functions closely.  Hyponatremia- sodium was 127, looks more like chronic. Has been present for many months. We'll obtain serum osmolality.  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 We'll continue to monitor the renal function the hospital  Diabetes mellitus- will start sliding scale insulin and also continue with Lantus.  Code status: DNR  Family discussion: Discussed with patient's husband and patient   Time Spent on Admission: 65 min  Aysia Lowder S [facility] Pager: [phone] 09/10/2013, 6:00 PM  If 7PM-7AM, please contact night-coverage  [email or web address]  Password [email or web address]

## 2013-09-10 NOTE — Progress Notes (Signed)
ANTIBIOTIC CONSULT NOTE - INITIAL  Pharmacy Consult for vancomycin Indication: cellulitis  Allergies  Allergen Reactions  . Epinephrine     Increased heart rate    Patient Measurements: Height: 5\' 4"  (162.6 cm) Weight: 256 lb 2.8 oz (116.2 kg) IBW/kg (Calculated) : 54.7 Adjusted Body Weight:   Vital Signs: Temp: 98.9 F (37.2 C) (12/31 1847) Temp src: Oral (12/31 1847) BP: 155/64 mmHg (12/31 1847) Pulse Rate: 89 (12/31 1847) Intake/Output from previous day:   Intake/Output from this shift:    Labs:  Recent Labs  09/10/13 1441  WBC 10.9*  HGB 11.7*  PLT 239  CREATININE 1.74*   Estimated Creatinine Clearance: 44.1 ml/min (by C-G formula based on Cr of 1.74). No results found for this basename: VANCOTROUGH, VANCOPEAK, VANCORANDOM, GENTTROUGH, GENTPEAK, GENTRANDOM, TOBRATROUGH, TOBRAPEAK, TOBRARND, AMIKACINPEAK, AMIKACINTROU, AMIKACIN,  in the last 72 hours   Microbiology: No results found for this or any previous visit (from the past 720 hour(s)).  Medical History: Past Medical History  Diagnosis Date  . HYPERLIPIDEMIA   . HYPERTENSION   . CAD, NATIVE VESSEL   . PVD   . DM   . COPD   . Edema   . H/O carotid endarterectomy   . Stroke     Medications:  Scheduled:  . albuterol  2.5 mg Nebulization Q6H  . [START ON 09/11/2013] aspirin EC  81 mg Oral Daily  . azithromycin  500 mg Intravenous Q24H  . [START ON 09/11/2013] cloNIDine  0.1 mg Oral Daily  . enoxaparin (LOVENOX) injection  40 mg Subcutaneous Q24H  . [START ON 09/11/2013] furosemide  20 mg Intravenous Q12H  . guaiFENesin  600 mg Oral BID  . [START ON 09/11/2013] insulin aspart  0-15 Units Subcutaneous TID WC  . insulin glargine  61 Units Subcutaneous BID  . ipratropium  0.5 mg Nebulization Q6H  . metoprolol tartrate  25 mg Oral BID  . [START ON 09/11/2013] pregabalin  75 mg Oral Daily  . sodium chloride  3 mL Intravenous Q12H   Infusions:   Assessment: 57 yo female with cellulitis will be started on  vancomycin therapy.  CrCl ~44  Goal of Therapy:  Vancomycin trough level 10-15 mcg/ml  Plan:  1) Vancomycin 1500mg  iv q24h 2) Monitor culture and sensitivity when they are available 3) Check vancomycin trough when it's appropriate. Monitor renal function.  Malikiah Debarr, Tsz-Yin 09/10/2013,7:24 PM

## 2013-09-10 NOTE — ED Notes (Addendum)
Pt sent here by home healthcare rn for possible pnx.  Pt states increased fatigue and sob over past 5 days.  VS wnl with audible wheezes noted.  Pt states her pcp decreased her lasix last week d/t decreased renal function.

## 2013-09-10 NOTE — ED Notes (Signed)
Report attempted x 1

## 2013-09-10 NOTE — ED Notes (Signed)
Pt sts she wet the bed. Pt cleaned up and rolled on own. Pt desaturated to 88% after movement. Pt placed in high-fowlers and instructed on deep breathing techniques. Pt spO2 went to 90%. Pt placed on 2L [place]- SpO2 wen up to 94%. Kaitlyn PA made aware. Pt not ambulatory. Pt uses wheelchair at home- provider notified.

## 2013-09-11 ENCOUNTER — Encounter (HOSPITAL_COMMUNITY): Payer: Self-pay | Admitting: *Deleted

## 2013-09-11 LAB — GLUCOSE, CAPILLARY
Glucose-Capillary: 362 mg/dL — ABNORMAL HIGH (ref 70–99)
Glucose-Capillary: 209 mg/dL — ABNORMAL HIGH (ref 70–99)

## 2013-09-11 LAB — COMPREHENSIVE METABOLIC PANEL
ALT: 10 U/L (ref 0–35)
AST: 16 U/L (ref 0–37)
Albumin: 2.4 g/dL — ABNORMAL LOW (ref 3.5–5.2)
Alkaline Phosphatase: 100 U/L (ref 39–117)
BUN: 43 mg/dL — ABNORMAL HIGH (ref 6–23)
CO2: 25 mEq/L (ref 19–32)
Calcium: 8.1 mg/dL — ABNORMAL LOW (ref 8.4–10.5)
Chloride: 89 mEq/L — ABNORMAL LOW (ref 96–112)
Creatinine, Ser: 1.9 mg/dL — ABNORMAL HIGH (ref 0.50–1.10)
GFR calc Af Amer: 32 mL/min — ABNORMAL LOW (ref >90)
GFR calc non Af Amer: 28 mL/min — ABNORMAL LOW (ref >90)
Glucose, Bld: 358 mg/dL — ABNORMAL HIGH (ref 70–99)
Potassium: 4.5 mEq/L (ref 3.7–5.3)
Sodium: 127 mEq/L — ABNORMAL LOW (ref 137–147)
Total Bilirubin: 0.5 mg/dL (ref 0.3–1.2)
Total Protein: 7.8 g/dL (ref 6.0–8.3)

## 2013-09-11 LAB — CBC
HCT: 28.2 % — ABNORMAL LOW (ref 36.0–46.0)
Hemoglobin: 9.6 g/dL — ABNORMAL LOW (ref 12.0–15.0)
MCH: 28.7 pg (ref 26.0–34.0)
MCHC: 34 g/dL (ref 30.0–36.0)
MCV: 84.4 fL (ref 78.0–100.0)
Platelets: 207 10*3/uL (ref 150–400)
RBC: 3.34 MIL/uL — ABNORMAL LOW (ref 3.87–5.11)
RDW: 15.1 % (ref 11.5–15.5)
WBC: 12.5 10*3/uL — ABNORMAL HIGH (ref 4.0–10.5)

## 2013-09-11 LAB — STREP PNEUMONIAE URINARY ANTIGEN: Strep Pneumo Urinary Antigen: NEGATIVE

## 2013-09-11 LAB — TROPONIN I
Troponin I: <0.3 ng/mL (ref <0.30)
Troponin I: <0.3 ng/mL (ref <0.30)

## 2013-09-11 MED ORDER — FUROSEMIDE 10 MG/ML IJ SOLN
INTRAMUSCULAR | Status: AC
Start: 2013-09-11 — End: 2013-09-11
  Administered 2013-09-11: 04:00:00 20 mg via INTRAVENOUS
  Filled 2013-09-11: qty 4

## 2013-09-11 MED ORDER — GUAIFENESIN 200 MG PO TABS
200.0000 mg | ORAL_TABLET | Freq: Four times a day (QID) | ORAL | Status: DC | PRN
  Administered 2013-09-12: 200 mg via ORAL
  Filled 2013-09-11 (×2): qty 1

## 2013-09-11 NOTE — Progress Notes (Addendum)
Pt O4x no complaints of pain Or sob. Pt stat 99% on 2l move to 1l stat 96%. Pt states poor Po intake. Diet consult order.  will continue to monitor

## 2013-09-11 NOTE — Consult Note (Addendum)
WOC wound consult note Reason for Consult: Consult requested for right heel wound.  Pt states this is chronic and has been there several months.  Pt has appointment at outpatient wound care center next week.  She has been receiving assistance from home health for dressing changes and using silver Hydrofiber dressings twice a week.  Wound type: Stage 3 Pressure Ulcer POA: Yes Measurement:3.5X4.5X.2cm Wound bed: 25% yellow slough, 65% red moist wound wound bed, 10% white and macerated wound edges. Drainage (amount, consistency, odor) Large amt green drainage with strong foul odor.  Pt states this is because dressing has not been changed in several days. Periwound: Intact skin surrounding. Dressing procedure/placement/frequency: Continue present plan of care with Aquacel to absorb drainage and provide antimicrobial benefits, but will increase dressing change frequency to daily R/T odor and drainage.  Pt can resume follow-up at outpatient wound care center after discharge for debridement of nonviable tissue.  Recommend continued follow-up from home health for dressing change assistance. Float heel when in bed to reduce pressure. Discussed pressure ulcer etiology, topical treatment, and preventive measures with patient, she appears to have a good understanding. Please re-consult if further assistance is needed.  Thank-you,  Cammie Mcgeeawn Adonis Ryther MSN, RN, CWOCN, BradyWCN-AP, CNS [phone]

## 2013-09-11 NOTE — Progress Notes (Signed)
PT pull off O2 stat 84% on RA. RN place pt on continuous monitor and back on 1l 93%

## 2013-09-11 NOTE — Progress Notes (Signed)
Pt removed O2 when sleeping. destat 84%, RN place O2 1L put on Pt. Pt stat 95%

## 2013-09-11 NOTE — Progress Notes (Signed)
PROGRESS NOTE  Kimberly [name] ZOX:[id number] DOB: May 13, 1956 DOA: 09/10/2013 PCP: [name], MD  Assessment/Plan: Pneumonia- chest x-ray shows the patient has right middle and lower lobe pneumonitis. Patient has been started on vancomycin for cellulitis and also on azithromycin which will also cover for pneumonia.   -Blood cultures x2 Cellulitis- patient will be started on vancomycin.  - appreciate wound care consult.  CHF- patient has history of diastolic dysfunction, her BNP is elevated to 5091. We'll start Lasix 20 g IV every 12 hours x2 doses.  - renal function a bit worse, - 2D echo pending.  Hyponatremia- sodium was 127, looks more like chronic. Has been present for many months. Stable C. KD stage III- patient has CK D. stage III.  - monitor Diabetes mellitus- will start sliding scale insulin and also continue with Lantus.   Diet: carb modified with fluid restriction Fluids: none DVT Prophylaxis: Lovenox  Code Status: DNR Family Communication: none  Disposition Plan: inpatient  Consultants:  none  Procedures:  2D echo pending   Antibiotics  Anti-infectives   Start     Dose/Rate Route Frequency Ordered Stop   09/10/13 2100  azithromycin (ZITHROMAX) 500 mg in dextrose 5 % 250 mL IVPB     500 mg 250 mL/hr over 60 Minutes Intravenous Every 24 hours 09/10/13 1921     09/10/13 2100  vancomycin (VANCOCIN) 1,500 mg in sodium chloride 0.9 % 500 mL IVPB     1,500 mg 250 mL/hr over 120 Minutes Intravenous Every 24 hours 09/10/13 1925     09/10/13 1615  cefTRIAXone (ROCEPHIN) 1 g in dextrose 5 % 50 mL IVPB     1 g 100 mL/hr over 30 Minutes Intravenous  Once 09/10/13 1611 09/10/13 1718   09/10/13 1615  azithromycin (ZITHROMAX) 500 mg in dextrose 5 % 250 mL IVPB  Status:  Discontinued     500 mg 250 mL/hr over 60 Minutes Intravenous  Once 09/10/13 1611 09/11/13 0835     Antibiotics Given (last 72 hours)   Date/Time Action Medication Dose Rate   09/10/13 2114  Given   azithromycin (ZITHROMAX) 500 mg in dextrose 5 % 250 mL IVPB 500 mg 250 mL/hr   09/10/13 2333 Given   vancomycin (VANCOCIN) 1,500 mg in sodium chloride 0.9 % 500 mL IVPB 1,500 mg 250 mL/hr      HPI/Subjective: - no complaints, still feels short of breath but better.   Objective: Filed Vitals:   09/11/13 0921 09/11/13 0958 09/11/13 1419 09/11/13 1422  BP:  159/52 134/54   Pulse:  79 76   Temp:  98.1 F (36.7 C) 97.9 F (36.6 C)   TempSrc:  Oral Oral   Resp:  19 20 20   Height:      Weight:      SpO2: 97% 98% 85% 93%    Intake/Output Summary (Last 24 hours) at 09/11/13 1613 Last data filed at 09/11/13 0921  Gross per 24 hour  Intake    990 ml  Output      0 ml  Net    990 ml   Filed Weights   09/10/13 1325 09/10/13 1847 09/11/13 0421  Weight: 113.399 kg (250 lb) 116.2 kg (256 lb 2.8 oz) 115.894 kg (255 lb 8 oz)    Exam:   General:  NAD  Cardiovascular: regular rate and rhythm, without MRG  Respiratory: bibasilar crackles   Abdomen: soft, not tender to palpation, positive bowel sounds  MSK: 2+ peripheral edema  Neuro: CN  2-12 grossly intact, MS 5/5 in all 4  Data Reviewed: Basic Metabolic Panel:  Recent Labs Lab 09/10/13 1441 09/11/13 0100  NA 127* 127*  K 4.8 4.5  CL 86* 89*  CO2 27 25  GLUCOSE 437* 358*  BUN 41* 43*  CREATININE 1.74* 1.90*  CALCIUM 9.0 8.1*   Liver Function Tests:  Recent Labs Lab 09/11/13 0100  AST 16  ALT 10  ALKPHOS 100  BILITOT 0.5  PROT 7.8  ALBUMIN 2.4*   CBC:  Recent Labs Lab 09/10/13 1441 09/11/13 0100  WBC 10.9* 12.5*  HGB 11.7* 9.6*  HCT 34.0* 28.2*  MCV 82.3 84.4  PLT 239 207   Cardiac Enzymes:  Recent Labs Lab 09/10/13 1441 09/10/13 1827 09/11/13 0100 09/11/13 0800  TROPONINI <0.30 <0.30 <0.30 <0.30   BNP (last 3 results)  Recent Labs  09/10/13 1441 09/10/13 1827  PROBNP 5021.0* 4050.0*   CBG:  Recent Labs Lab 09/10/13 1350 09/10/13 2053 09/11/13 0617 09/11/13 1607    GLUCAP 443* 328* 362* 209*    No results found for this or any previous visit (from the past 240 hour(s)).   Studies: Dg Chest 2 View  09/10/2013   CLINICAL DATA:  Chest pain, cough, congestion  EXAM: CHEST  2 VIEW  COMPARISON:  05/14/2010.  FINDINGS: Narrative increased density projects in the region of right middle lobe and to a lesser extent right lower lobe. This finding partially silhouettes the right heart border. Cardiac silhouette is otherwise appears be within normal limits. There is mild prominence of the interstitial markings. No further focal regions of consolidation no further focal infiltrates. The osseous structures unremarkable.  IMPRESSION: Right middle as well as possibly right lower lobe pneumonitis. There is also prominence of the interstitial markings may reflect an interstitial component. Pulmonary vascular congestion is also diagnostic consideration.   Electronically Signed   By: Salome HolmesHector  Cooper M.D.   On: 09/10/2013 14:14    Scheduled Meds: . aspirin EC  81 mg Oral Daily  . azithromycin  500 mg Intravenous Q24H  . cloNIDine  0.1 mg Oral Daily  . enoxaparin (LOVENOX) injection  40 mg Subcutaneous Q24H  . furosemide  20 mg Intravenous Q12H  . guaiFENesin  600 mg Oral BID  . insulin aspart  0-15 Units Subcutaneous TID WC  . insulin glargine  61 Units Subcutaneous BID  . ipratropium-albuterol  3 mL Nebulization Q6H  . metoprolol tartrate  25 mg Oral BID  . pregabalin  75 mg Oral Daily  . sodium chloride  3 mL Intravenous Q12H  . vancomycin  1,500 mg Intravenous Q24H   Continuous Infusions:   Active Problems:   DM   HYPERTENSION   CAP (community acquired pneumonia)   Pneumonia   Cellulitis  Time spent: 6335  Pamella Pertostin Gherghe, MD [facility] Pager [phone]. If 7 PM - 7 AM, please contact night-coverage at [email or web address], password [email or web address] 09/11/2013, 4:13 PM  LOS: 1 day

## 2013-09-11 NOTE — Progress Notes (Signed)
ECHO tech page, said unable to see Pt today unless order STAT or pending d/c

## 2013-09-12 DIAGNOSIS — I2510 Atherosclerotic heart disease of native coronary artery without angina pectoris: Secondary | ICD-10-CM

## 2013-09-12 DIAGNOSIS — I5032 Chronic diastolic (congestive) heart failure: Secondary | ICD-10-CM | POA: Insufficient documentation

## 2013-09-12 DIAGNOSIS — I5030 Unspecified diastolic (congestive) heart failure: Secondary | ICD-10-CM

## 2013-09-12 DIAGNOSIS — I509 Heart failure, unspecified: Secondary | ICD-10-CM

## 2013-09-12 DIAGNOSIS — I059 Rheumatic mitral valve disease, unspecified: Secondary | ICD-10-CM

## 2013-09-12 DIAGNOSIS — R609 Edema, unspecified: Secondary | ICD-10-CM

## 2013-09-12 LAB — CBC
HEMATOCRIT: 27.9 % — AB (ref 36.0–46.0)
HEMOGLOBIN: 9.2 g/dL — AB (ref 12.0–15.0)
MCH: 28.4 pg (ref 26.0–34.0)
MCHC: 33 g/dL (ref 30.0–36.0)
MCV: 86.1 fL (ref 78.0–100.0)
PLATELETS: 203 10*3/uL (ref 150–400)
RBC: 3.24 MIL/uL — ABNORMAL LOW (ref 3.87–5.11)
RDW: 15.2 % (ref 11.5–15.5)
WBC: 11.4 10*3/uL — AB (ref 4.0–10.5)

## 2013-09-12 LAB — LEGIONELLA ANTIGEN, URINE: Legionella Antigen, Urine: NEGATIVE

## 2013-09-12 LAB — BASIC METABOLIC PANEL
BUN: 50 mg/dL — ABNORMAL HIGH (ref 6–23)
CALCIUM: 8.2 mg/dL — AB (ref 8.4–10.5)
CO2: 28 meq/L (ref 19–32)
CREATININE: 2.28 mg/dL — AB (ref 0.50–1.10)
Chloride: 97 mEq/L (ref 96–112)
GFR calc Af Amer: 26 mL/min — ABNORMAL LOW (ref >90)
GFR calc non Af Amer: 22 mL/min — ABNORMAL LOW (ref >90)
GLUCOSE: 104 mg/dL — AB (ref 70–99)
Potassium: 4 mEq/L (ref 3.7–5.3)
Sodium: 136 mEq/L — ABNORMAL LOW (ref 137–147)

## 2013-09-12 LAB — D-DIMER, QUANTITATIVE: D-Dimer, Quant: 1.34 ug/mL-FEU — ABNORMAL HIGH (ref 0.00–0.48)

## 2013-09-12 LAB — GLUCOSE, CAPILLARY
GLUCOSE-CAPILLARY: 211 mg/dL — AB (ref 70–99)
GLUCOSE-CAPILLARY: 250 mg/dL — AB (ref 70–99)
Glucose-Capillary: 205 mg/dL — ABNORMAL HIGH (ref 70–99)
Glucose-Capillary: 87 mg/dL (ref 70–99)
Glucose-Capillary: 213 mg/dL — ABNORMAL HIGH (ref 70–99)
Glucose-Capillary: 306 mg/dL — ABNORMAL HIGH (ref 70–99)

## 2013-09-12 MED ORDER — PRO-STAT SUGAR FREE PO LIQD
30.0000 mL | ORAL | Status: DC
  Administered 2013-09-12: 17:00:00 30 mL via ORAL
  Filled 2013-09-12 (×4): qty 30

## 2013-09-12 MED ORDER — IPRATROPIUM-ALBUTEROL 0.5-2.5 (3) MG/3ML IN SOLN
3.0000 mL | Freq: Three times a day (TID) | RESPIRATORY_TRACT | Status: DC
  Administered 2013-09-12 – 2013-09-15 (×11): 3 mL via RESPIRATORY_TRACT
  Filled 2013-09-12 (×10): qty 3

## 2013-09-12 MED ORDER — ADULT MULTIVITAMIN W/MINERALS CH
1.0000 | ORAL_TABLET | Freq: Every day | ORAL | Status: DC
  Administered 2013-09-12 – 2013-09-15 (×4): 1 via ORAL
  Filled 2013-09-12 (×4): qty 1

## 2013-09-12 MED ORDER — ALBUTEROL SULFATE (2.5 MG/3ML) 0.083% IN NEBU: 2.5000 mg | INHALATION_SOLUTION | Freq: Four times a day (QID) | RESPIRATORY_TRACT | Status: DC | PRN

## 2013-09-12 MED ORDER — ALBUTEROL SULFATE (2.5 MG/3ML) 0.083% IN NEBU: 2.5000 mg | INHALATION_SOLUTION | Freq: Three times a day (TID) | RESPIRATORY_TRACT | Status: DC

## 2013-09-12 MED ORDER — FUROSEMIDE 40 MG PO TABS
40.0000 mg | ORAL_TABLET | Freq: Every day | ORAL | Status: DC
  Filled 2013-09-12: qty 1

## 2013-09-12 NOTE — Progress Notes (Signed)
PROGRESS NOTE  Kimberly ManilaSusan K [name] BJY:[id number] DOB: 04-15-1956 DOA: 09/10/2013 PCP: Ginette OttoSTONEKING,[name] THOMAS, MD  Assessment/Plan: Pneumonia- chest x-ray shows the patient has right middle and lower lobe pneumonitis. Patient has been started on vancomycin for cellulitis and also on azithromycin which will also cover for pneumonia.  - Blood cultures x2 - will narrow coverage in am if remains afebrile and Leukocytosis improved.  Cellulitis - on vancomycin.  - appreciate wound care consult.  CHF- patient has history of diastolic dysfunction, her BNP is elevated to 5091.  - cardiology consulted, appreciate input.  Hyponatremia - sodium was 127, looks more like chronic. Has been present for many months. Stable C. 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  - monitor Diabetes mellitus - sliding scale insulin and Lantus.   Diet: carb modified with fluid restriction Fluids: none DVT Prophylaxis: Lovenox  Code Status: DNR Family Communication: none  Disposition Plan: inpatient  Consultants:  none  Procedures:  2D echo   Antibiotics  Anti-infectives   Start     Dose/Rate Route Frequency Ordered Stop   09/10/13 2100  azithromycin (ZITHROMAX) 500 mg in dextrose 5 % 250 mL IVPB     500 mg 250 mL/hr over 60 Minutes Intravenous Every 24 hours 09/10/13 1921     09/10/13 2100  vancomycin (VANCOCIN) 1,500 mg in sodium chloride 0.9 % 500 mL IVPB     1,500 mg 250 mL/hr over 120 Minutes Intravenous Every 24 hours 09/10/13 1925     09/10/13 1615  cefTRIAXone (ROCEPHIN) 1 g in dextrose 5 % 50 mL IVPB     1 g 100 mL/hr over 30 Minutes Intravenous  Once 09/10/13 1611 09/10/13 1718   09/10/13 1615  azithromycin (ZITHROMAX) 500 mg in dextrose 5 % 250 mL IVPB  Status:  Discontinued     500 mg 250 mL/hr over 60 Minutes Intravenous  Once 09/10/13 1611 09/11/13 0835     Antibiotics Given (last 72 hours)   Date/Time Action Medication Dose Rate   09/10/13 2114 Given   azithromycin (ZITHROMAX) 500  mg in dextrose 5 % 250 mL IVPB 500 mg 250 mL/hr   09/10/13 2333 Given   vancomycin (VANCOCIN) 1,500 mg in sodium chloride 0.9 % 500 mL IVPB 1,500 mg 250 mL/hr   09/11/13 2033 Given   azithromycin (ZITHROMAX) 500 mg in dextrose 5 % 250 mL IVPB 500 mg 250 mL/hr   09/11/13 2138 Given   vancomycin (VANCOCIN) 1,500 mg in sodium chloride 0.9 % 500 mL IVPB 1,500 mg 250 mL/hr      HPI/Subjective: - no complaints, still feels short of breath but better.   Objective: Filed Vitals:   09/12/13 0543 09/12/13 0842 09/12/13 1009 09/12/13 1100  BP: 134/52  153/73   Pulse: 69  88   Temp: 98.3 F (36.8 C)     TempSrc: Oral     Resp: 18  18   Height:      Weight: 118.48 kg (261 lb 3.2 oz)   116.8 kg (257 lb 8 oz)  SpO2: 95% 96% 95%     Intake/Output Summary (Last 24 hours) at 09/12/13 1440 Last data filed at 09/12/13 0812  Gross per 24 hour  Intake   1350 ml  Output      0 ml  Net   1350 ml   Filed Weights   09/11/13 0421 09/12/13 0543 09/12/13 1100  Weight: 115.894 kg (255 lb 8 oz) 118.48 kg (261 lb 3.2 oz) 116.8 kg (257  lb 8 oz)    Exam:   General:  NAD  Cardiovascular: regular rate and rhythm, without MRG  Respiratory: bibasilar crackles   Abdomen: soft, not tender to palpation, positive bowel sounds  MSK: 2+ peripheral edema  Neuro: CN 2-12 grossly intact, MS 5/5 in all 4  Data Reviewed: Basic Metabolic Panel:  Recent Labs Lab 09/10/13 1441 09/11/13 0100 09/12/13 0426  NA 127* 127* 136*  K 4.8 4.5 4.0  CL 86* 89* 97  CO2 27 25 28   GLUCOSE 437* 358* 104*  BUN 41* 43* 50*  CREATININE 1.74* 1.90* 2.28*  CALCIUM 9.0 8.1* 8.2*   Liver Function Tests:  Recent Labs Lab 09/11/13 0100  AST 16  ALT 10  ALKPHOS 100  BILITOT 0.5  PROT 7.8  ALBUMIN 2.4*   CBC:  Recent Labs Lab 09/10/13 1441 09/11/13 0100 09/12/13 0426  WBC 10.9* 12.5* 11.4*  HGB 11.7* 9.6* 9.2*  HCT 34.0* 28.2* 27.9*  MCV 82.3 84.4 86.1  PLT 239 207 203   Cardiac Enzymes:  Recent  Labs Lab 09/10/13 1441 09/10/13 1827 09/11/13 0100 09/11/13 0800  TROPONINI <0.30 <0.30 <0.30 <0.30   BNP (last 3 results)  Recent Labs  09/10/13 1441 09/10/13 1827  PROBNP 5021.0* 4050.0*   CBG:  Recent Labs Lab 09/11/13 1607 09/11/13 2145 09/12/13 0623 09/12/13 1048 09/12/13 1345  GLUCAP 209* 205* 87 213* 211*    Recent Results (from the past 240 hour(s))  CULTURE, BLOOD (ROUTINE X 2)     Status: None   Collection Time    09/10/13  6:15 PM      Result Value Range Status   Specimen Description BLOOD RIGHT HAND   Final   Special Requests BOTTLES DRAWN AEROBIC ONLY 5CC   Final   Culture  Setup Time     Final   Value: 09/11/2013 02:45     Performed at [facility]   Culture     Final   Value:        BLOOD CULTURE RECEIVED NO GROWTH TO DATE CULTURE WILL BE HELD FOR 5 DAYS BEFORE ISSUING A FINAL NEGATIVE REPORT     Performed at [facility]   Report Status PENDING   Incomplete  CULTURE, BLOOD (ROUTINE X 2)     Status: None   Collection Time    09/10/13  6:27 PM      Result Value Range Status   Specimen Description BLOOD LEFT HAND   Final   Special Requests BOTTLES DRAWN AEROBIC ONLY 10CC   Final   Culture  Setup Time     Final   Value: 09/11/2013 02:45     Performed at [facility]   Culture     Final   Value:        BLOOD CULTURE RECEIVED NO GROWTH TO DATE CULTURE WILL BE HELD FOR 5 DAYS BEFORE ISSUING A FINAL NEGATIVE REPORT     Performed at [facility]   Report Status PENDING   Incomplete     Studies: No results found.  Scheduled Meds: . aspirin EC  81 mg Oral Daily  . azithromycin  500 mg Intravenous Q24H  . cloNIDine  0.1 mg Oral Daily  . enoxaparin (LOVENOX) injection  40 mg Subcutaneous Q24H  . [START ON 09/13/2013] furosemide  40 mg Oral Daily  . guaiFENesin  600 mg Oral BID  . insulin aspart  0-15 Units Subcutaneous TID WC  . insulin glargine  61 Units Subcutaneous BID  .  ipratropium-albuterol  3 mL Nebulization  TID  . metoprolol tartrate  25 mg Oral BID  . pregabalin  75 mg Oral Daily  . sodium chloride  3 mL Intravenous Q12H  . vancomycin  1,500 mg Intravenous Q24H   Continuous Infusions:   Active Problems:   DM   HYPERTENSION   CAP (community acquired pneumonia)   Pneumonia   Cellulitis   Diastolic CHF  Time spent: 35  Pamella Pert, MD [facility] Pager [phone]. If 7 PM - 7 AM, please contact night-coverage at [email or web address], password [facility] 09/12/2013, 2:40 PM  LOS: 2 days

## 2013-09-12 NOTE — Progress Notes (Signed)
INITIAL NUTRITION ASSESSMENT  DOCUMENTATION CODES Per approved criteria  -Morbid Obesity   INTERVENTION: Encourage healthful PO intake Provide Pro-stat once daily Provide Multivitamin with minerals daily Will add Glucerna shakes BID if PO intake is consistently <75% of meals  NUTRITION DIAGNOSIS: Inadequate oral intake related to varied appetite as evidenced by >4% weight loss in one month per pt report.   Goal: Pt to meet >/= 90% of their estimated nutrition needs   Monitor:  PO intake Weight Labs  Reason for Assessment: Consult due to Poor PO intake/ MST  58 y.o. female  Admitting Dx: <principal problem not specified>  ASSESSMENT: 58 year old female who has a past medical history of HYPERLIPIDEMIA; HYPERTENSION; CAD, NATIVE VESSEL; PVD; DM; COPD; Edema; H/O carotid endarterectomy; and Stroke. Presented to the ED with shortness of breath and chest congestion for past 5 days. Patient also complains of right-sided chest pain, patient is chronically bedbound and is morbidly obese.  Pt reports having a poor appetite and poor po intake PTA causing her to lose 10-15 lbs this month. She states her appetite is better today, she ate 100% of breakfast but only 25% of lunch. Pt states she didn't like the taste of food at lunch. Pt reports having extensive diet education for diabetes in the past; she has no questions or concerns. Encouraged healthful eating. Encourage snacking or use of Glucerna Shakes on days when her appetite is very poor.   Height: Ht Readings from Last 1 Encounters:  09/10/13 5\' 4"  (1.626 m)    Weight: Wt Readings from Last 1 Encounters:  09/12/13 257 lb 8 oz (116.8 kg)    Ideal Body Weight: 120 lbs  % Ideal Body Weight: 214%  Wt Readings from Last 10 Encounters:  09/12/13 257 lb 8 oz (116.8 kg)  09/15/12 300 lb (136.079 kg)  01/18/12 279 lb (126.554 kg)  12/18/11 279 lb (126.554 kg)  09/15/10 238 lb (107.956 kg)  08/03/10 243 lb (110.224 kg)  06/01/10  228 lb (103.42 kg)    Usual Body Weight: 278 lbs  % Usual Body Weight: 92%  BMI:  Body mass index is 44.18 kg/(m^2).  Estimated Nutritional Needs: Kcal: 1900-2100 Protein: 100-110 grams Fluid: 2.9 L/day  Skin: +2 RLE and LLE edema; stage 3 pressure ulcer on heel  Diet Order: Carb Control  EDUCATION NEEDS: -No education needs identified at this time   Intake/Output Summary (Last 24 hours) at 09/12/13 1528 Last data filed at 09/12/13 6045  Gross per 24 hour  Intake   1350 ml  Output      0 ml  Net   1350 ml    Last BM: 12/30   Labs:   Recent Labs Lab 09/10/13 1441 09/11/13 0100 09/12/13 0426  NA 127* 127* 136*  K 4.8 4.5 4.0  CL 86* 89* 97  CO2 27 25 28   BUN 41* 43* 50*  CREATININE 1.74* 1.90* 2.28*  CALCIUM 9.0 8.1* 8.2*  GLUCOSE 437* 358* 104*    CBG (last 3)   Recent Labs  09/12/13 0623 09/12/13 1048 09/12/13 1345  GLUCAP 87 213* 211*    Scheduled Meds: . aspirin EC  81 mg Oral Daily  . azithromycin  500 mg Intravenous Q24H  . cloNIDine  0.1 mg Oral Daily  . enoxaparin (LOVENOX) injection  40 mg Subcutaneous Q24H  . [START ON 09/13/2013] furosemide  40 mg Oral Daily  . guaiFENesin  600 mg Oral BID  . insulin aspart  0-15 Units Subcutaneous TID WC  .  insulin glargine  61 Units Subcutaneous BID  . ipratropium-albuterol  3 mL Nebulization TID  . metoprolol tartrate  25 mg Oral BID  . pregabalin  75 mg Oral Daily  . sodium chloride  3 mL Intravenous Q12H  . vancomycin  1,500 mg Intravenous Q24H    Continuous Infusions:   Past Medical History  Diagnosis Date  . HYPERLIPIDEMIA   . HYPERTENSION   . CAD, NATIVE VESSEL   . PVD   . DM   . COPD   . Edema   . H/O carotid endarterectomy   . Stroke   . CHF (congestive heart failure)     Past Surgical History  Procedure Laterality Date  . Eye surgery    . Carotid endarterectomy      Ian Malkineanne Barnett RD, LDN Inpatient Clinical Dietitian Pager: [phone] After Hours Pager: [phone]

## 2013-09-12 NOTE — Progress Notes (Addendum)
Aquacel given by WOC is missing in room. RN reorder will do dressing change will Aqucel arrives, Incentive spiro. Per mD note

## 2013-09-12 NOTE — Evaluation (Signed)
Physical Therapy Evaluation Patient Details Name: Kimberly ManilaSusan K Smick MRN: [id number] DOB: 01-20-1956 Today's Date: 09/12/2013 Time: [id number] PT Time Calculation (min): 30 min  PT Assessment / Plan / Recommendation History of Present Illness  58 year old female who has a past medical history of HYPERLIPIDEMIA; HYPERTENSION; CAD, NATIVE VESSEL; PVD; DM; COPD; Edema; H/O carotid endarterectomy; and Stroke. Admitted for shortness of breath, community acquired pneumonia  Clinical Impression  Pt presents with decreased strength, activity tolerance and mobility, will benefit from skilled PT services to address deficits and increase functional independence.    PT Assessment  Patient needs continued PT services    Follow Up Recommendations  Home health PT    Does the patient have the potential to tolerate intense rehabilitation      Barriers to Discharge Decreased caregiver support husband works    Equipment Recommendations  None recommended by PT    Recommendations for Other Services OT consult   Frequency Min 3X/week    Precautions / Restrictions Restrictions Weight Bearing Restrictions: No   Pertinent Vitals/Pain No c/o pain.  Sp O2 91% on 1L O2 during activity.  Shortness of breath with exertion, needs cues for deep breathing.      Mobility  Bed Mobility Bed Mobility: Supine to Sit;Sit to Supine Supine to Sit: 4: Min assist;With rails Sit to Supine: 4: Min assist;With rail Details for Bed Mobility Assistance: assist for LEs sit to supine Transfers Transfers: Sit to Stand;Stand to Sit Sit to Stand: 4: Min guard Stand to Sit: 4: Min guard Ambulation/Gait Ambulation/Gait Assistance: 4: Min guard Ambulation Distance (Feet): 40 Feet Assistive device: Rolling walker Ambulation/Gait Assistance Details: 40' x 2 with standing rest break, spO2 91% with activity on 1L O2    Exercises  Performed side stepping both ways and to get to head of bed with min HHA.   PT Diagnosis:  Difficulty walking;Generalized weakness  PT Problem List: Decreased strength;Decreased activity tolerance;Decreased mobility;Cardiopulmonary status limiting activity PT Treatment Interventions: DME instruction;Gait training;Patient/family education;Wheelchair mobility training;Functional mobility training;Therapeutic activities;Therapeutic exercise;Balance training;Neuromuscular re-education;Modalities     PT Goals(Current goals can be found in the care plan section) Acute Rehab PT Goals Patient Stated Goal: get better PT Goal Formulation: With patient Time For Goal Achievement: 09/26/13 Potential to Achieve Goals: Good  Visit Information  Last PT Received On: 09/12/13 Assistance Needed: +1 History of Present Illness: 58 year old female who has a past medical history of HYPERLIPIDEMIA; HYPERTENSION; CAD, NATIVE VESSEL; PVD; DM; COPD; Edema; H/O carotid endarterectomy; and Stroke. Admitted for shortness of breath, community acquired pneumonia       Prior Functioning  Home Living Family/patient expects to be discharged to:: Private residence Living Arrangements: Spouse/significant other Available Help at Discharge: Family;Available PRN/intermittently Type of Home: House Home Access: Ramped entrance Home Layout: One level Home Equipment: Walker - 4 wheels;Walker - 2 wheels;Hospital bed;Bedside commode Prior Function Level of Independence: Independent with assistive device(s) Comments: uses RW at home, sleeps in hospital bed, uses BSC to conserve energy at home Communication Communication: No difficulties    Cognition  Cognition Arousal/Alertness: Awake/alert Behavior During Therapy: WFL for tasks assessed/performed Overall Cognitive Status: Within Functional Limits for tasks assessed    Extremity/Trunk Assessment Lower Extremity Assessment Lower Extremity Assessment: Generalized weakness (neuropathy B LEs) Cervical / Trunk Assessment Cervical / Trunk Assessment: Normal    Balance    End of Session PT - End of Session Equipment Utilized During Treatment: Gait belt Activity Tolerance: Patient limited by fatigue;Patient tolerated treatment well Patient left: in bed;with call  bell/phone within reach Nurse Communication: Mobility status  GP     Jakyra Kenealy 09/12/2013, 1:38 PM

## 2013-09-12 NOTE — Progress Notes (Signed)
Utilization Review Completed Karrissa Parchment J. Elodie Panameno, RN, BSN, NCM [phone]  

## 2013-09-12 NOTE — Progress Notes (Signed)
VASCULAR LAB PRELIMINARY  PRELIMINARY  PRELIMINARY  PRELIMINARY  Bilateral lower extremity venous duplex completed.    Preliminary report:  Bilateral:  No evidence of DVT, superficial thrombosis, or Baker's Cyst.   Amity Roes, RVS 09/12/2013, 7:02 PM

## 2013-09-12 NOTE — Consult Note (Signed)
CARDIOLOGY CONSULT NOTE       Patient ID: Kimberly ManilaSusan K Simmon MRN: [id number] DOB/AGE: 59/15/1957 59 y.o.  Admit date: 09/10/2013 Referring Physician:  Elvera LennoxGherghe Primary Physician: Ginette OttoSTONEKING,[name] THOMAS, MD Primary Cardiologist:  Jens Somrenshaw Reason for Consultation:  CHF  Active Problems:   DM   HYPERTENSION   CAP (community acquired pneumonia)   Pneumonia   Cellulitis   HPI:  Pleasant but morbidly obese 58 yo  female [facility]. Following carotid endarterectomy she went into pulmonary edema. Cardiac catheterization in August of 2011 revealed and ejection fraction of 70%. The LAD had a 30% proximal and 70% distal lesion. It appeared diffusely diseased and diabetic appearing. There is a very small first marginal branch that has about 70-80% narrowing proximally. There is a tiny second marginal branch with about 50% narrowing.There is a small nondominant right coronary artery without critical narrowing. She has been treated medically. Echocardiogram was repeated in December of 2011 and revealed an ejection fraction of 60-65%. There was a dynamic obstruction of 2.3 m/s. There is mild mitral regurgitation and trace aortic insufficiency. F/U MUGA 01/18/12 with EF 62%.    She is very immobile and barely walks.  She has fallen a few times and is scared to get up.  She has had a draining ucler in her left foot and [place] [place].  To see wound center this month.  Has had CRF.  Lasix initially 40mg  daily then changed to qod.  About a week ago Dr Pete GlatterStoneking stopped her diuretics totally.  Since Saturday increased congestion with non productive cough.  No fever.  She does not weight herself.  Chronic dyspnea.  CXR with RU/RML pneumonia.  BNP found to be elevated  At 5021.  No chest pain palpitations and chronic LE edema.  ECG with chronic LBBB and no acute changes    ROS All other systems reviewed and negative except as noted above  Past  Medical History  Diagnosis Date  . HYPERLIPIDEMIA   . HYPERTENSION   . CAD, NATIVE VESSEL   . PVD   . DM   . COPD   . Edema   . H/O carotid endarterectomy   . Stroke   . CHF (congestive heart failure)     No family history on file.  History   Social History  . Marital Status: Married    Spouse Name: N/A    Number of Children: N/A  . Years of Education: N/A   Occupational History  . Not on file.   Social History Main Topics  . Smoking status: Former Games developermoker  . Smokeless tobacco: Not on file  . Alcohol Use: No  . Drug Use: No  . Sexual Activity: Not on file   Other Topics Concern  . Not on file   Social History Narrative  . No narrative on file    Past Surgical History  Procedure Laterality Date  . Eye surgery    . Carotid endarterectomy       . aspirin EC  81 mg Oral Daily  . azithromycin  500 mg Intravenous Q24H  . cloNIDine  0.1 mg Oral Daily  . enoxaparin (LOVENOX) injection  40 mg Subcutaneous Q24H  . guaiFENesin  600 mg Oral BID  . insulin aspart  0-15 Units Subcutaneous TID WC  . insulin glargine  61 Units Subcutaneous BID  . ipratropium-albuterol  3 mL Nebulization TID  . metoprolol  tartrate  25 mg Oral BID  . pregabalin  75 mg Oral Daily  . sodium chloride  3 mL Intravenous Q12H  . vancomycin  1,500 mg Intravenous Q24H      Physical Exam: Blood pressure 153/73, pulse 88, temperature 98.3 F (36.8 C), temperature source Oral, resp. rate 18, height 5\' 4"  (1.626 m), weight 257 lb 8 oz (116.8 kg), SpO2 95.00%.   Affect appropriate Morbidly obes white female  HEENT: normal Neck supple with no adenopathy JVP not assessable no bruits no thyromegaly Lungs clear with no wheezing and good diaphragmatic motion Heart:  S1/S2 no murmur, no rub, gallop or click PMI normal Abdomen: benighn, BS positve, no tenderness, no AAA no bruit.  No HSM or HJR Distal pulses intact with no bruits Plus 2 bilateral LE  Edema with erythema and ulcer on left foot    Neuro non-focal Skin warm and dry No muscular weakness   Labs:   Lab Results  Component Value Date   WBC 11.4* 09/12/2013   HGB 9.2* 09/12/2013   HCT 27.9* 09/12/2013   MCV 86.1 09/12/2013   PLT 203 09/12/2013    Recent Labs Lab 09/11/13 0100 09/12/13 0426  NA 127* 136*  K 4.5 4.0  CL 89* 97  CO2 25 28  BUN 43* 50*  CREATININE 1.90* 2.28*  CALCIUM 8.1* 8.2*  PROT 7.8  --   BILITOT 0.5  --   ALKPHOS 100  --   ALT 10  --   AST 16  --   GLUCOSE 358* 104*   Lab Results  Component Value Date   CKTOTAL 3150* 05/06/2010   CKMB 32.8 CRITICAL VALUE NOTED.  VALUE IS CONSISTENT WITH PREVIOUSLY REPORTED AND CALLED VALUE.* 05/06/2010   TROPONINI <0.30 09/11/2013       Radiology: Dg Chest 2 View  09/10/2013   CLINICAL DATA:  Chest pain, cough, congestion  EXAM: CHEST  2 VIEW  COMPARISON:  05/14/2010.  FINDINGS: Narrative increased density projects in the region of right middle lobe and to a lesser extent right lower lobe. This finding partially silhouettes the right heart border. Cardiac silhouette is otherwise appears be within normal limits. There is mild prominence of the interstitial markings. No further focal regions of consolidation no further focal infiltrates. The osseous structures unremarkable.  IMPRESSION: Right middle as well as possibly right lower lobe pneumonitis. There is also prominence of the interstitial markings may reflect an interstitial component. Pulmonary vascular congestion is also diagnostic consideration.   Electronically Signed   By: Salome Holmes M.D.   On: 09/10/2013 14:14    EKG: SR LBBB old   ASSESSMENT AND PLAN:  CHF:  More likely diastolic.  She has described small LVOT gradient but no murmur on exam.  She clearly has flunked withholding of diuretics.  Her clinical presentation is more that of URI/pneumonia Would put her back on maintenance diuretic 40mg  daily Will have to put up with some azotemia to keep lungs clear and more importantly reduce edema in  legs to help ucler heal.   CAD:  No chest pain LBBB makes ECG not interpretable.  Essentially bed bound and DNR would not consider re look cath unless she were to develop active angina Continue ASA and beta blocker  Pulm:  Needs Incentive spirometry  Continue antibiotics.   PT/OT:  May need inpatient rehab for mobility skin care consult  Check d dimer as she is at high risk for DVT  Prophylaxis   Signed: Charlton Haws 09/12/2013,  2:09 PM

## 2013-09-12 NOTE — Progress Notes (Signed)
PT O4x, no compliant of pain or SOB. Will continue to monitor

## 2013-09-12 NOTE — Progress Notes (Signed)
  Echocardiogram 2D Echocardiogram has been performed.  Georgian CoWILLIAMS, Sharniece Gibbon 09/12/2013, 9:37 AM

## 2013-09-13 DIAGNOSIS — M79609 Pain in unspecified limb: Secondary | ICD-10-CM

## 2013-09-13 LAB — CBC
HCT: 30.7 % — ABNORMAL LOW (ref 36.0–46.0)
HEMOGLOBIN: 10.3 g/dL — AB (ref 12.0–15.0)
MCH: 29.3 pg (ref 26.0–34.0)
MCHC: 33.6 g/dL (ref 30.0–36.0)
MCV: 87.2 fL (ref 78.0–100.0)
Platelets: 246 10*3/uL (ref 150–400)
RBC: 3.52 MIL/uL — AB (ref 3.87–5.11)
RDW: 15.5 % (ref 11.5–15.5)
WBC: 11.6 10*3/uL — ABNORMAL HIGH (ref 4.0–10.5)

## 2013-09-13 LAB — BASIC METABOLIC PANEL
BUN: 43 mg/dL — ABNORMAL HIGH (ref 6–23)
CO2: 24 mEq/L (ref 19–32)
Calcium: 8.4 mg/dL (ref 8.4–10.5)
Chloride: 97 mEq/L (ref 96–112)
Creatinine, Ser: 1.78 mg/dL — ABNORMAL HIGH (ref 0.50–1.10)
GFR calc non Af Amer: 30 mL/min — ABNORMAL LOW (ref >90)
GFR, EST AFRICAN AMERICAN: 35 mL/min — AB (ref >90)
GLUCOSE: 224 mg/dL — AB (ref 70–99)
POTASSIUM: 6 meq/L — AB (ref 3.7–5.3)
SODIUM: 133 meq/L — AB (ref 137–147)

## 2013-09-13 LAB — GLUCOSE, CAPILLARY
GLUCOSE-CAPILLARY: 151 mg/dL — AB (ref 70–99)
GLUCOSE-CAPILLARY: 233 mg/dL — AB (ref 70–99)
Glucose-Capillary: 130 mg/dL — ABNORMAL HIGH (ref 70–99)
Glucose-Capillary: 169 mg/dL — ABNORMAL HIGH (ref 70–99)

## 2013-09-13 LAB — POTASSIUM: POTASSIUM: 4.1 meq/L (ref 3.7–5.3)

## 2013-09-13 MED ORDER — LEVOFLOXACIN 500 MG PO TABS
500.0000 mg | ORAL_TABLET | Freq: Once | ORAL | Status: AC
  Administered 2013-09-13: 14:00:00 500 mg via ORAL
  Filled 2013-09-13: qty 1

## 2013-09-13 MED ORDER — DOXYCYCLINE HYCLATE 100 MG PO TABS: 100.0000 mg | ORAL_TABLET | Freq: Two times a day (BID) | ORAL | Status: DC

## 2013-09-13 MED ORDER — FUROSEMIDE 40 MG PO TABS
40.0000 mg | ORAL_TABLET | Freq: Two times a day (BID) | ORAL | Status: DC
  Administered 2013-09-13 – 2013-09-15 (×5): 40 mg via ORAL
  Filled 2013-09-13 (×7): qty 1

## 2013-09-13 MED ORDER — MAGNESIUM HYDROXIDE 400 MG/5ML PO SUSP
15.0000 mL | Freq: Every day | ORAL | Status: DC | PRN
  Administered 2013-09-14: 15 mL via ORAL
  Filled 2013-09-13: qty 30

## 2013-09-13 MED ORDER — SODIUM POLYSTYRENE SULFONATE 15 GM/60ML PO SUSP
15.0000 g | Freq: Once | ORAL | Status: DC
Start: 2013-09-13 — End: 2013-09-13
  Filled 2013-09-13: qty 60

## 2013-09-13 MED ORDER — LEVOFLOXACIN 250 MG PO TABS
250.0000 mg | ORAL_TABLET | Freq: Every day | ORAL | Status: DC
  Administered 2013-09-14 – 2013-09-15 (×2): 250 mg via ORAL
  Filled 2013-09-13 (×2): qty 1

## 2013-09-13 NOTE — Progress Notes (Signed)
ANTIBIOTIC CONSULT NOTE - INITIAL  Pharmacy Consult for Levaquin Indication: Bronchitis/?Cellulitis  Allergies  Allergen Reactions  . Epinephrine     Increased heart rate    Patient Measurements: Height: 5\' 4"  (162.6 cm) Weight: 259 lb 3.2 oz (117.572 kg) IBW/kg (Calculated) : 54.7  Vital Signs: Temp: 99.1 F (37.3 C) (01/03 0500) Temp src: Oral (01/03 0500) BP: 152/58 mmHg (01/03 0500) Pulse Rate: 75 (01/03 0500) Intake/Output from previous day: 01/02 0701 - 01/03 0700 In: 480 [P.O.:480] Out: -  Intake/Output from this shift: Total I/O In: 360 [P.O.:360] Out: 650 [Urine:650]  Labs:  Recent Labs  09/11/13 0100 09/12/13 0426 09/13/13 0440  WBC 12.5* 11.4* 11.6*  HGB 9.6* 9.2* 10.3*  PLT 207 203 246  CREATININE 1.90* 2.28* 1.78*   Estimated Creatinine Clearance: 43.5 ml/min (by C-G formula based on Cr of 1.78). No results found for this basename: VANCOTROUGH, VANCOPEAK, VANCORANDOM, GENTTROUGH, GENTPEAK, GENTRANDOM, TOBRATROUGH, TOBRAPEAK, TOBRARND, AMIKACINPEAK, AMIKACINTROU, AMIKACIN,  in the last 72 hours   Microbiology: Recent Results (from the past 720 hour(s))  CULTURE, BLOOD (ROUTINE X 2)     Status: None   Collection Time    09/10/13  6:15 PM      Result Value Range Status   Specimen Description BLOOD RIGHT HAND   Final   Special Requests BOTTLES DRAWN AEROBIC ONLY 5CC   Final   Culture  Setup Time     Final   Value: 09/11/2013 02:45     Performed at [facility]   Culture     Final   Value:        BLOOD CULTURE RECEIVED NO GROWTH TO DATE CULTURE WILL BE HELD FOR 5 DAYS BEFORE ISSUING A FINAL NEGATIVE REPORT     Performed at [facility]   Report Status PENDING   Incomplete  CULTURE, BLOOD (ROUTINE X 2)     Status: None   Collection Time    09/10/13  6:27 PM      Result Value Range Status   Specimen Description BLOOD LEFT HAND   Final   Special Requests BOTTLES DRAWN AEROBIC ONLY 10CC   Final   Culture  Setup Time     Final   Value: 09/11/2013 02:45     Performed at [facility]   Culture     Final   Value:        BLOOD CULTURE RECEIVED NO GROWTH TO DATE CULTURE WILL BE HELD FOR 5 DAYS BEFORE ISSUING A FINAL NEGATIVE REPORT     Performed at [facility]   Report Status PENDING   Incomplete    Medical History: Past Medical History  Diagnosis Date  . HYPERLIPIDEMIA   . HYPERTENSION   . CAD, NATIVE VESSEL   . PVD   . DM   . COPD   . Edema   . H/O carotid endarterectomy   . Stroke   . CHF (congestive heart failure)     Medications:  Scheduled:  . aspirin EC  81 mg Oral Daily  . cloNIDine  0.1 mg Oral Daily  . enoxaparin (LOVENOX) injection  40 mg Subcutaneous Q24H  . feeding supplement (PRO-STAT SUGAR FREE 64)  30 mL Oral Q24H  . furosemide  40 mg Oral BID  . guaiFENesin  600 mg Oral BID  . insulin aspart  0-15 Units Subcutaneous TID WC  . insulin glargine  61 Units Subcutaneous BID  . ipratropium-albuterol  3 mL Nebulization TID  . metoprolol tartrate  25 mg Oral BID  . multivitamin with minerals  1 tablet Oral Daily  . pregabalin  75 mg Oral Daily  . sodium chloride  3 mL Intravenous Q12H   Assessment: 58 yo F with bronchitis and possible cellulitis initially started on vancomycin and azithromycin, now to transition to Levaquin. Pt remains afebrile, WBC 11.6, SCr much improved, now 1.78 (CrCl ~44 ml/min).  12/31 Vanc>>1/3 12/31 Azithromycin>1/3 1/3 LVQ>>  12/31 BCx>>ngtd  Goal of Therapy:  Eradication of infection  Plan:  - D/c vanc + azithro - Start Levaquin 500 mg PO x 1, then continue with 250 mg PO daily - Monitor renal function closely as may be able to adjust dose if continues to improve - F/u C&S, temp, WBC, clinical status  Margie Billet, PharmD Clinical Pharmacist - Resident Pager: [phone] Pharmacy: [phone] 09/13/2013 11:40 AM

## 2013-09-13 NOTE — Progress Notes (Signed)
PROGRESS NOTE  Kimberly ManilaSusan K [name] JYN:[id number] DOB: [birth year] DOA: 09/10/2013 PCP: Ginette OttoSTONEKING,[name] THOMAS, MD  Assessment/Plan: Pneumonia- chest x-ray shows the patient has right middle and lower lobe pneumonitis. Patient has been started on vancomycin for cellulitis and also on azithromycin which will also cover for pneumonia.  - Blood cultures x2 - narrow to levaquin today.  Cellulitis   - appreciate wound care consult.  CHF- patient has history of diastolic dysfunction, her BNP is elevated to 5091.  - cardiology consulted, appreciate input.  Hyponatremia - sodium was 127, looks more like chronic. Has been present for many months. Stable - 133 this morning, improving with lasix C. 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  - monitor, improving.  Diabetes mellitus - sliding scale insulin and Lantus.   Diet: carb modified with fluid restriction Fluids: none DVT Prophylaxis: Lovenox  Code Status: DNR Family Communication: none  Disposition Plan: inpatient  Consultants:  none  Procedures:  2D echo   Antibiotics  Anti-infectives   Start     Dose/Rate Route Frequency Ordered Stop   09/14/13 1000  levofloxacin (LEVAQUIN) tablet 250 mg     250 mg Oral Daily 09/13/13 1142     09/13/13 1200  levofloxacin (LEVAQUIN) tablet 500 mg     500 mg Oral  Once 09/13/13 1142     09/13/13 1100  doxycycline (VIBRA-TABS) tablet 100 mg  Status:  Discontinued     100 mg Oral Every 12 hours 09/13/13 1058 09/13/13 1059   09/10/13 2100  azithromycin (ZITHROMAX) 500 mg in dextrose 5 % 250 mL IVPB  Status:  Discontinued     500 mg 250 mL/hr over 60 Minutes Intravenous Every 24 hours 09/10/13 1921 09/13/13 1058   09/10/13 2100  vancomycin (VANCOCIN) 1,500 mg in sodium chloride 0.9 % 500 mL IVPB  Status:  Discontinued     1,500 mg 250 mL/hr over 120 Minutes Intravenous Every 24 hours 09/10/13 1925 09/13/13 1058   09/10/13 1615  cefTRIAXone (ROCEPHIN) 1 g in dextrose 5 % 50 mL IVPB     1 g 100  mL/hr over 30 Minutes Intravenous  Once 09/10/13 1611 09/10/13 1718   09/10/13 1615  azithromycin (ZITHROMAX) 500 mg in dextrose 5 % 250 mL IVPB  Status:  Discontinued     500 mg 250 mL/hr over 60 Minutes Intravenous  Once 09/10/13 1611 09/11/13 0835     Antibiotics Given (last 72 hours)   Date/Time Action Medication Dose Rate   09/10/13 2114 Given   azithromycin (ZITHROMAX) 500 mg in dextrose 5 % 250 mL IVPB 500 mg 250 mL/hr   09/10/13 2333 Given   vancomycin (VANCOCIN) 1,500 mg in sodium chloride 0.9 % 500 mL IVPB 1,500 mg 250 mL/hr   09/11/13 2033 Given   azithromycin (ZITHROMAX) 500 mg in dextrose 5 % 250 mL IVPB 500 mg 250 mL/hr   09/11/13 2138 Given   vancomycin (VANCOCIN) 1,500 mg in sodium chloride 0.9 % 500 mL IVPB 1,500 mg 250 mL/hr   09/12/13 2022 Given   azithromycin (ZITHROMAX) 500 mg in dextrose 5 % 250 mL IVPB 500 mg 250 mL/hr   09/12/13 2229 Given  [other med was infusing]   vancomycin (VANCOCIN) 1,500 mg in sodium chloride 0.9 % 500 mL IVPB 1,500 mg 250 mL/hr      HPI/Subjective: - feels significantly improved today   Objective: Filed Vitals:   09/12/13 2043 09/12/13 2200 09/13/13 0500 09/13/13 0857  BP: 153/46  152/58  Pulse: 78  75   Temp: 98 F (36.7 C)  99.1 F (37.3 C)   TempSrc: Oral  Oral   Resp: 18  22   Height:      Weight:   117.572 kg (259 lb 3.2 oz)   SpO2: 97% 97% 98% 98%    Intake/Output Summary (Last 24 hours) at 09/13/13 1224 Last data filed at 09/13/13 1107  Gross per 24 hour  Intake    480 ml  Output    650 ml  Net   -170 ml   Filed Weights   09/12/13 0543 09/12/13 1100 09/13/13 0500  Weight: 118.48 kg (261 lb 3.2 oz) 116.8 kg (257 lb 8 oz) 117.572 kg (259 lb 3.2 oz)    Exam:  General:  NAD  Cardiovascular: regular rate and rhythm, without MRG  Respiratory: bibasilar crackles   Abdomen: soft, not tender to palpation, positive bowel sounds  MSK: 1+ peripheral edema  Neuro: non focal  Data Reviewed: Basic Metabolic  Panel:  Recent Labs Lab 09/10/13 1441 09/11/13 0100 09/12/13 0426 09/13/13 0440 09/13/13 1030  NA 127* 127* 136* 133*  --   K 4.8 4.5 4.0 6.0* 4.1  CL 86* 89* 97 97  --   CO2 27 25 28 24   --   GLUCOSE 437* 358* 104* 224*  --   BUN 41* 43* 50* 43*  --   CREATININE 1.74* 1.90* 2.28* 1.78*  --   CALCIUM 9.0 8.1* 8.2* 8.4  --    Liver Function Tests:  Recent Labs Lab 09/11/13 0100  AST 16  ALT 10  ALKPHOS 100  BILITOT 0.5  PROT 7.8  ALBUMIN 2.4*   CBC:  Recent Labs Lab 09/10/13 1441 09/11/13 0100 09/12/13 0426 09/13/13 0440  WBC 10.9* 12.5* 11.4* 11.6*  HGB 11.7* 9.6* 9.2* 10.3*  HCT 34.0* 28.2* 27.9* 30.7*  MCV 82.3 84.4 86.1 87.2  PLT 239 207 203 246   Cardiac Enzymes:  Recent Labs Lab 09/10/13 1441 09/10/13 1827 09/11/13 0100 09/11/13 0800  TROPONINI <0.30 <0.30 <0.30 <0.30   BNP (last 3 results)  Recent Labs  09/10/13 1441 09/10/13 1827  PROBNP 5021.0* 4050.0*   CBG:  Recent Labs Lab 09/12/13 1345 09/12/13 1706 09/12/13 2102 09/13/13 0625 09/13/13 1051  GLUCAP 211* 250* 306* 151* 130*    Recent Results (from the past 240 hour(s))  CULTURE, BLOOD (ROUTINE X 2)     Status: None   Collection Time    09/10/13  6:15 PM      Result Value Range Status   Specimen Description BLOOD RIGHT HAND   Final   Special Requests BOTTLES DRAWN AEROBIC ONLY 5CC   Final   Culture  Setup Time     Final   Value: 09/11/2013 02:45     Performed at [facility]   Culture     Final   Value:        BLOOD CULTURE RECEIVED NO GROWTH TO DATE CULTURE WILL BE HELD FOR 5 DAYS BEFORE ISSUING A FINAL NEGATIVE REPORT     Performed at [facility]   Report Status PENDING   Incomplete  CULTURE, BLOOD (ROUTINE X 2)     Status: None   Collection Time    09/10/13  6:27 PM      Result Value Range Status   Specimen Description BLOOD LEFT HAND   Final   Special Requests BOTTLES DRAWN AEROBIC ONLY 10CC   Final   Culture  Setup Time  Final   Value:  09/11/2013 02:45     Performed at [facility]   Culture     Final   Value:        BLOOD CULTURE RECEIVED NO GROWTH TO DATE CULTURE WILL BE HELD FOR 5 DAYS BEFORE ISSUING A FINAL NEGATIVE REPORT     Performed at [facility]   Report Status PENDING   Incomplete     Studies: No results found.  Scheduled Meds: . aspirin EC  81 mg Oral Daily  . cloNIDine  0.1 mg Oral Daily  . enoxaparin (LOVENOX) injection  40 mg Subcutaneous Q24H  . feeding supplement (PRO-STAT SUGAR FREE 64)  30 mL Oral Q24H  . furosemide  40 mg Oral BID  . guaiFENesin  600 mg Oral BID  . insulin aspart  0-15 Units Subcutaneous TID WC  . insulin glargine  61 Units Subcutaneous BID  . ipratropium-albuterol  3 mL Nebulization TID  . [START ON 09/14/2013] levofloxacin  250 mg Oral Daily  . levofloxacin  500 mg Oral Once  . metoprolol tartrate  25 mg Oral BID  . multivitamin with minerals  1 tablet Oral Daily  . pregabalin  75 mg Oral Daily  . sodium chloride  3 mL Intravenous Q12H   Continuous Infusions:   Active Problems:   DM   HYPERTENSION   CAP (community acquired pneumonia)   Pneumonia   Cellulitis   Diastolic CHF  Time spent: 25  Pamella Pert, MD [facility] Pager [phone]. If 7 PM - 7 AM, please contact night-coverage at [email or web address], password [facility] 09/13/2013, 12:24 PM  LOS: 3 days

## 2013-09-13 NOTE — Progress Notes (Signed)
SUBJECTIVE:  She says that her breathing is better with less rattling.  No pain   PHYSICAL EXAM Filed Vitals:   09/12/13 1827 09/12/13 2043 09/12/13 2200 09/13/13 0500  BP: 152/60 153/46  152/58  Pulse: 83 78  75  Temp: 98.7 F (37.1 C) 98 F (36.7 C)  99.1 F (37.3 C)  TempSrc: Oral Oral  Oral  Resp: 20 18  22   Height:      Weight:    259 lb 3.2 oz (117.572 kg)  SpO2: 98% 97% 97% 98%   General:  No distress Lungs:  Decreased breath sounds at the bases Heart:  RRR Abdomen:  Positive bowel sounds, no rebound no guarding Extremities:  Moderate edema.  LABS: Lab Results  Component Value Date   TROPONINI <0.30 09/11/2013   Results for orders placed during the hospital encounter of 09/10/13 (from the past 24 hour(s))  GLUCOSE, CAPILLARY     Status: Abnormal   Collection Time    09/12/13 10:48 AM      Result Value Range   Glucose-Capillary 213 (*) 70 - 99 mg/dL   Comment 1 Notify RN    GLUCOSE, CAPILLARY     Status: Abnormal   Collection Time    09/12/13  1:45 PM      Result Value Range   Glucose-Capillary 211 (*) 70 - 99 mg/dL   Comment 1 Notify RN    D-DIMER, QUANTITATIVE     Status: Abnormal   Collection Time    09/12/13  3:45 PM      Result Value Range   D-Dimer, Quant 1.34 (*) 0.00 - 0.48 ug/mL-FEU  GLUCOSE, CAPILLARY     Status: Abnormal   Collection Time    09/12/13  5:06 PM      Result Value Range   Glucose-Capillary 250 (*) 70 - 99 mg/dL   Comment 1 Notify RN    GLUCOSE, CAPILLARY     Status: Abnormal   Collection Time    09/12/13  9:02 PM      Result Value Range   Glucose-Capillary 306 (*) 70 - 99 mg/dL  CBC     Status: Abnormal   Collection Time    09/13/13  4:40 AM      Result Value Range   WBC 11.6 (*) 4.0 - 10.5 K/uL   RBC 3.52 (*) 3.87 - 5.11 MIL/uL   Hemoglobin 10.3 (*) 12.0 - 15.0 g/dL   HCT 54.0 (*) 98.1 - 19.1 %   MCV 87.2  78.0 - 100.0 fL   MCH 29.3  26.0 - 34.0 pg   MCHC 33.6  30.0 - 36.0 g/dL   RDW 47.8  29.5 - 62.1 %   Platelets  246  150 - 400 K/uL  BASIC METABOLIC PANEL     Status: Abnormal   Collection Time    09/13/13  4:40 AM      Result Value Range   Sodium 133 (*) 137 - 147 mEq/L   Potassium 6.0 (*) 3.7 - 5.3 mEq/L   Chloride 97  96 - 112 mEq/L   CO2 24  19 - 32 mEq/L   Glucose, Bld 224 (*) 70 - 99 mg/dL   BUN 43 (*) 6 - 23 mg/dL   Creatinine, Ser 3.08 (*) 0.50 - 1.10 mg/dL   Calcium 8.4  8.4 - 65.7 mg/dL   GFR calc non Af Amer 30 (*) >90 mL/min   GFR calc Af Amer 35 (*) >90 mL/min  GLUCOSE, CAPILLARY  Status: Abnormal   Collection Time    09/13/13  6:25 AM      Result Value Range   Glucose-Capillary 151 (*) 70 - 99 mg/dL    Intake/Output Summary (Last 24 hours) at 09/13/13 0749 Last data filed at 09/12/13 1745  Gross per 24 hour  Intake    480 ml  Output      0 ml  Net    480 ml     ASSESSMENT AND PLAN:  Acute on chronic diastolic HF:  I/O incomplete.  Weight is up.  Creat is OK.  I will increase to BID Lasix.    CAD:  Medical management.   Fayrene FearingJames [facility] 09/13/2013 7:49 AM

## 2013-09-14 LAB — GLUCOSE, CAPILLARY
GLUCOSE-CAPILLARY: 80 mg/dL (ref 70–99)
GLUCOSE-CAPILLARY: 90 mg/dL (ref 70–99)
Glucose-Capillary: 136 mg/dL — ABNORMAL HIGH (ref 70–99)
Glucose-Capillary: 235 mg/dL — ABNORMAL HIGH (ref 70–99)
Glucose-Capillary: 144 mg/dL — ABNORMAL HIGH (ref 70–99)

## 2013-09-14 LAB — CBC
HCT: 31.5 % — ABNORMAL LOW (ref 36.0–46.0)
Hemoglobin: 10.2 g/dL — ABNORMAL LOW (ref 12.0–15.0)
MCH: 28.5 pg (ref 26.0–34.0)
MCHC: 32.4 g/dL (ref 30.0–36.0)
MCV: 88 fL (ref 78.0–100.0)
PLATELETS: 243 10*3/uL (ref 150–400)
RBC: 3.58 MIL/uL — ABNORMAL LOW (ref 3.87–5.11)
RDW: 15 % (ref 11.5–15.5)
WBC: 11 10*3/uL — AB (ref 4.0–10.5)

## 2013-09-14 LAB — BASIC METABOLIC PANEL
BUN: 35 mg/dL — ABNORMAL HIGH (ref 6–23)
CALCIUM: 8.9 mg/dL (ref 8.4–10.5)
CO2: 30 mEq/L (ref 19–32)
Chloride: 99 mEq/L (ref 96–112)
Creatinine, Ser: 1.73 mg/dL — ABNORMAL HIGH (ref 0.50–1.10)
GFR calc Af Amer: 36 mL/min — ABNORMAL LOW (ref >90)
GFR, EST NON AFRICAN AMERICAN: 31 mL/min — AB (ref >90)
Glucose, Bld: 74 mg/dL (ref 70–99)
Potassium: 4 mEq/L (ref 3.7–5.3)
Sodium: 139 mEq/L (ref 137–147)

## 2013-09-14 MED ORDER — ENOXAPARIN SODIUM 60 MG/0.6ML ~~LOC~~ SOLN
60.0000 mg | SUBCUTANEOUS | Status: DC
  Administered 2013-09-14: 60 mg via SUBCUTANEOUS
  Filled 2013-09-14 (×2): qty 0.6

## 2013-09-14 NOTE — Progress Notes (Signed)
SUBJECTIVE:  She says that her breathing OK although she says that she has some "rattling".   PHYSICAL EXAM Filed Vitals:   09/13/13 1538 09/13/13 1950 09/13/13 2124 09/14/13 0412  BP: 155/59 163/74  188/69  Pulse: 81 81  88  Temp: 98.6 F (37 C) 98 F (36.7 C)  97.9 F (36.6 C)  TempSrc: Oral Oral  Oral  Resp: 20 20  20   Height:      Weight:    255 lb 4.7 oz (115.8 kg)  SpO2: 100% 100% 100% 96%   General:  No distress Lungs:  Few wheezes with  Heart:  RRR Abdomen:  Positive bowel sounds, no rebound no guarding Extremities:  Mild edema improved  LABS:  Results for orders placed during the hospital encounter of 09/10/13 (from the past 24 hour(s))  POTASSIUM     Status: None   Collection Time    09/13/13 10:30 AM      Result Value Range   Potassium 4.1  3.7 - 5.3 mEq/L  GLUCOSE, CAPILLARY     Status: Abnormal   Collection Time    09/13/13 10:51 AM      Result Value Range   Glucose-Capillary 130 (*) 70 - 99 mg/dL   Comment 1 Notify RN    GLUCOSE, CAPILLARY     Status: Abnormal   Collection Time    09/13/13  5:20 PM      Result Value Range   Glucose-Capillary 233 (*) 70 - 99 mg/dL   Comment 1 Notify RN    GLUCOSE, CAPILLARY     Status: Abnormal   Collection Time    09/13/13 10:04 PM      Result Value Range   Glucose-Capillary 169 (*) 70 - 99 mg/dL   Comment 1 Notify RN    GLUCOSE, CAPILLARY     Status: None   Collection Time    09/14/13  4:23 AM      Result Value Range   Glucose-Capillary 80  70 - 99 mg/dL  CBC     Status: Abnormal   Collection Time    09/14/13  5:18 AM      Result Value Range   WBC 11.0 (*) 4.0 - 10.5 K/uL   RBC 3.58 (*) 3.87 - 5.11 MIL/uL   Hemoglobin 10.2 (*) 12.0 - 15.0 g/dL   HCT 16.131.5 (*) 09.636.0 - 04.546.0 %   MCV 88.0  78.0 - 100.0 fL   MCH 28.5  26.0 - 34.0 pg   MCHC 32.4  30.0 - 36.0 g/dL   RDW 40.915.0  81.111.5 - 91.415.5 %   Platelets 243  150 - 400 K/uL  BASIC METABOLIC PANEL     Status: Abnormal   Collection Time    09/14/13  5:18 AM       Result Value Range   Sodium 139  137 - 147 mEq/L   Potassium 4.0  3.7 - 5.3 mEq/L   Chloride 99  96 - 112 mEq/L   CO2 30  19 - 32 mEq/L   Glucose, Bld 74  70 - 99 mg/dL   BUN 35 (*) 6 - 23 mg/dL   Creatinine, Ser 7.821.73 (*) 0.50 - 1.10 mg/dL   Calcium 8.9  8.4 - 95.610.5 mg/dL   GFR calc non Af Amer 31 (*) >90 mL/min   GFR calc Af Amer 36 (*) >90 mL/min  GLUCOSE, CAPILLARY     Status: None   Collection Time    09/14/13  5:59  AM      Result Value Range   Glucose-Capillary 90  70 - 99 mg/dL    Intake/Output Summary (Last 24 hours) at 09/14/13 0816 Last data filed at 09/14/13 0424  Gross per 24 hour  Intake    720 ml  Output   1950 ml  Net  -1230 ml     ASSESSMENT AND PLAN:  Acute on chronic diastolic HF: Increased Lasix yesterday and good UO.  Creat is stable.  Continue current Lasix.    CAD:  Medical management.   Fayrene Fearing Dennie Vecchio 09/14/2013 8:16 AM

## 2013-09-14 NOTE — Progress Notes (Signed)
Pt sitting up in chair using 02 continuously. 02 removed with resting sats 89-90%. Pt ambulated in hallway with walker about 25 ft with sats 87-88%. Pt states this is how she usually feels after such a distance.

## 2013-09-14 NOTE — Progress Notes (Signed)
PROGRESS NOTE  Kimberly [name] ZOX:[id number] DOB: Jan 11, 1956 DOA: 09/10/2013 PCP: [name], MD  Assessment/Plan: Pneumonia- chest x-ray shows the patient has right middle and lower lobe pneumonitis. Patient has been started on vancomycin for cellulitis and also on azithromycin which will also cover for pneumonia.  - Blood cultures x2 - narrow to levaquin 1/3. - stable, will ambulate with and without O2. She might need ocygen at home.   Cellulitis   - appreciate wound care consult.  CHF- patient has history of diastolic dysfunction, her BNP is elevated to 5091.  - cardiology consulted, appreciate input.  - continue diuresis Hyponatremia - sodium was 127, looks more like chronic. Has been present for many months. Stable and improving.  C. 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  - monitor, improving.  Diabetes mellitus - sliding scale insulin and Lantus.   Diet: carb modified with fluid restriction Fluids: none DVT Prophylaxis: Lovenox  Code Status: DNR Family Communication: none  Disposition Plan: inpatient  Consultants:  none  Procedures:  2D echo   Antibiotics  Anti-infectives   Start     Dose/Rate Route Frequency Ordered Stop   09/14/13 1000  levofloxacin (LEVAQUIN) tablet 250 mg     250 mg Oral Daily 09/13/13 1142     09/13/13 1200  levofloxacin (LEVAQUIN) tablet 500 mg     500 mg Oral  Once 09/13/13 1142 09/13/13 1414   09/13/13 1100  doxycycline (VIBRA-TABS) tablet 100 mg  Status:  Discontinued     100 mg Oral Every 12 hours 09/13/13 1058 09/13/13 1059   09/10/13 2100  azithromycin (ZITHROMAX) 500 mg in dextrose 5 % 250 mL IVPB  Status:  Discontinued     500 mg 250 mL/hr over 60 Minutes Intravenous Every 24 hours 09/10/13 1921 09/13/13 1058   09/10/13 2100  vancomycin (VANCOCIN) 1,500 mg in sodium chloride 0.9 % 500 mL IVPB  Status:  Discontinued     1,500 mg 250 mL/hr over 120 Minutes Intravenous Every 24 hours 09/10/13 1925 09/13/13 1058   09/10/13 1615  cefTRIAXone (ROCEPHIN) 1 g in dextrose 5 % 50 mL IVPB     1 g 100 mL/hr over 30 Minutes Intravenous  Once 09/10/13 1611 09/10/13 1718   09/10/13 1615  azithromycin (ZITHROMAX) 500 mg in dextrose 5 % 250 mL IVPB  Status:  Discontinued     500 mg 250 mL/hr over 60 Minutes Intravenous  Once 09/10/13 1611 09/11/13 0835     Antibiotics Given (last 72 hours)   Date/Time Action Medication Dose Rate   09/11/13 2033 Given   azithromycin (ZITHROMAX) 500 mg in dextrose 5 % 250 mL IVPB 500 mg 250 mL/hr   09/11/13 2138 Given   vancomycin (VANCOCIN) 1,500 mg in sodium chloride 0.9 % 500 mL IVPB 1,500 mg 250 mL/hr   09/12/13 2022 Given   azithromycin (ZITHROMAX) 500 mg in dextrose 5 % 250 mL IVPB 500 mg 250 mL/hr   09/12/13 2229 Given  [other med was infusing]   vancomycin (VANCOCIN) 1,500 mg in sodium chloride 0.9 % 500 mL IVPB 1,500 mg 250 mL/hr   09/13/13 1414 Given   levofloxacin (LEVAQUIN) tablet 500 mg 500 mg    09/14/13 1039 Given   levofloxacin (LEVAQUIN) tablet 250 mg 250 mg       HPI/Subjective: - feels better,  Objective: Filed Vitals:   09/13/13 2124 09/14/13 0412 09/14/13 1000 09/14/13 1428  BP:  188/69  140/57  Pulse:  88  77  Temp:  97.9 F (36.6 C)  97.9 F (36.6 C)  TempSrc:  Oral  Oral  Resp:  20  18  Height:      Weight:  115.8 kg (255 lb 4.7 oz)    SpO2: 100% 96% 97% 100%    Intake/Output Summary (Last 24 hours) at 09/14/13 1441 Last data filed at 09/14/13 1317  Gross per 24 hour  Intake    720 ml  Output   1800 ml  Net  -1080 ml   Filed Weights   09/12/13 1100 09/13/13 0500 09/14/13 0412  Weight: 116.8 kg (257 lb 8 oz) 117.572 kg (259 lb 3.2 oz) 115.8 kg (255 lb 4.7 oz)   Exam:  General:  NAD  Cardiovascular: regular rate and rhythm, without MRG  Respiratory: bibasilar crackles improved significantly  Abdomen: soft, not tender to palpation, positive bowel sounds  MSK: 1+ peripheral edema  Neuro: non focal  Data Reviewed: Basic  Metabolic Panel:  Recent Labs Lab 09/10/13 1441 09/11/13 0100 09/12/13 0426 09/13/13 0440 09/13/13 1030 09/14/13 0518  NA 127* 127* 136* 133*  --  139  K 4.8 4.5 4.0 6.0* 4.1 4.0  CL 86* 89* 97 97  --  99  CO2 27 25 28 24   --  30  GLUCOSE 437* 358* 104* 224*  --  74  BUN 41* 43* 50* 43*  --  35*  CREATININE 1.74* 1.90* 2.28* 1.78*  --  1.73*  CALCIUM 9.0 8.1* 8.2* 8.4  --  8.9   Liver Function Tests:  Recent Labs Lab 09/11/13 0100  AST 16  ALT 10  ALKPHOS 100  BILITOT 0.5  PROT 7.8  ALBUMIN 2.4*   CBC:  Recent Labs Lab 09/10/13 1441 09/11/13 0100 09/12/13 0426 09/13/13 0440 09/14/13 0518  WBC 10.9* 12.5* 11.4* 11.6* 11.0*  HGB 11.7* 9.6* 9.2* 10.3* 10.2*  HCT 34.0* 28.2* 27.9* 30.7* 31.5*  MCV 82.3 84.4 86.1 87.2 88.0  PLT 239 207 203 246 243   Cardiac Enzymes:  Recent Labs Lab 09/10/13 1441 09/10/13 1827 09/11/13 0100 09/11/13 0800  TROPONINI <0.30 <0.30 <0.30 <0.30   BNP (last 3 results)  Recent Labs  09/10/13 1441 09/10/13 1827  PROBNP 5021.0* 4050.0*   CBG:  Recent Labs Lab 09/13/13 1720 09/13/13 2204 09/14/13 0423 09/14/13 0559 09/14/13 1057  GLUCAP 233* 169* 80 90 136*    Recent Results (from the past 240 hour(s))  CULTURE, BLOOD (ROUTINE X 2)     Status: None   Collection Time    09/10/13  6:15 PM      Result Value Range Status   Specimen Description BLOOD RIGHT HAND   Final   Special Requests BOTTLES DRAWN AEROBIC ONLY 5CC   Final   Culture  Setup Time     Final   Value: 09/11/2013 02:45     [facility]formed [facility]   Culture     Final   Value:        BLOOD CULTURE RECEIVED NO GROWTH TO DATE CULTURE WILL BE HELD FOR 5 DAYS BEFORE ISSUING A FINAL NEGATIVE REPORT     Performed at [facility]   Report Status PENDING   Incomplete  CULTURE, BLOOD (ROUTINE X 2)     Status: None   Collection Time    09/10/13  6:27 PM      Result Value Range Status   Specimen Description BLOOD LEFT HAND   Final   Special  Requests BOTTLES DRAWN AEROBIC ONLY 10CC  Final   Culture  Setup Time     Final   Value: 09/11/2013 02:45     Performed at [facility]   Culture     Final   Value:        BLOOD CULTURE RECEIVED NO GROWTH TO DATE CULTURE WILL BE HELD FOR 5 DAYS BEFORE ISSUING A FINAL NEGATIVE REPORT     Performed at [facility]   Report Status PENDING   Incomplete     Studies: No results found.  Scheduled Meds: . aspirin EC  81 mg Oral Daily  . cloNIDine  0.1 mg Oral Daily  . enoxaparin (LOVENOX) injection  60 mg Subcutaneous Q24H  . feeding supplement (PRO-STAT SUGAR FREE 64)  30 mL Oral Q24H  . furosemide  40 mg Oral BID  . guaiFENesin  600 mg Oral BID  . insulin aspart  0-15 Units Subcutaneous TID WC  . insulin glargine  61 Units Subcutaneous BID  . ipratropium-albuterol  3 mL Nebulization TID  . levofloxacin  250 mg Oral Daily  . metoprolol tartrate  25 mg Oral BID  . multivitamin with minerals  1 tablet Oral Daily  . pregabalin  75 mg Oral Daily  . sodium chloride  3 mL Intravenous Q12H   Continuous Infusions:   Active Problems:   DM   HYPERTENSION   CAP (community acquired pneumonia)   Pneumonia   Cellulitis   Diastolic CHF  Time spent: 25  Pamella Pert, MD [facility] Pager [phone]. If 7 PM - 7 AM, please contact night-coverage at [email or web address], password [facility] 09/14/2013, 2:41 PM  LOS: 4 days

## 2013-09-15 DIAGNOSIS — N183 Chronic kidney disease, stage 3 unspecified: Secondary | ICD-10-CM

## 2013-09-15 DIAGNOSIS — I5033 Acute on chronic diastolic (congestive) heart failure: Secondary | ICD-10-CM

## 2013-09-15 LAB — GLUCOSE, CAPILLARY
GLUCOSE-CAPILLARY: 40 mg/dL — AB (ref 70–99)
GLUCOSE-CAPILLARY: 85 mg/dL (ref 70–99)

## 2013-09-15 MED ORDER — GUAIFENESIN ER 600 MG PO TB12: 600.0000 mg | ORAL_TABLET | Freq: Two times a day (BID) | ORAL | Status: DC

## 2013-09-15 MED ORDER — LEVOFLOXACIN 250 MG PO TABS: 250.0000 mg | ORAL_TABLET | Freq: Every day | ORAL | Status: DC

## 2013-09-15 MED ORDER — CLONIDINE HCL 0.1 MG PO TABS: 0.1000 mg | ORAL_TABLET | Freq: Two times a day (BID) | ORAL | Status: DC

## 2013-09-15 MED ORDER — CLONIDINE HCL 0.1 MG PO TABS
0.1000 mg | ORAL_TABLET | Freq: Two times a day (BID) | ORAL | Status: DC
  Administered 2013-09-15: 0.1 mg via ORAL
  Filled 2013-09-15 (×2): qty 1

## 2013-09-15 NOTE — Discharge Instructions (Signed)
Home Health PT set up through [facility] [phone]. ° °

## 2013-09-15 NOTE — Progress Notes (Signed)
Inpatient Diabetes Program Recommendations  AACE/ADA: New Consensus Statement on Inpatient Glycemic Control (2013)  Target Ranges:  Prepandial:   less than 140 mg/dL      Peak postprandial:   less than 180 mg/dL (1-2 hours)      Critically ill patients:  140 - 180 mg/dL   Reason for Visit: Results for [name], Shandreka K (MRN [id number]) as of 09/15/2013 11:48  Ref. Range 09/14/2013 10:57 09/14/2013 16:20 09/14/2013 21:53 09/15/2013 05:19 09/15/2013 06:18  Glucose-Capillary Latest Range: 70-99 mg/dL 782136 (H) 956235 (H) 213144 (H) 40 (LL) 85   Note hypoglycemic event. Please consider reducing Lantus to 40 units bid.    Thanks, Beryl MeagerJenny Lynita Groseclose, RN, BC-ADM Inpatient Diabetes Coordinator Pager [phone]

## 2013-09-15 NOTE — Discharge Summary (Signed)
Physician Discharge Summary  Kimberly ManilaSusan K [name] UJW:[id number]:[id number] DOB: 1956-04-18 DOA: 09/10/2013  PCP: Ginette OttoSTONEKING,[name] THOMAS, MD  Admit date: 09/10/2013 Discharge date: 09/15/2013  Time spent: 35 minutes  Recommendations for Outpatient Follow-up:  1. Follow up with PCP in 1 week  2. Follow up with Dr. Jens Somrenshaw in 4 weeks 3. Follow up with wound clinic as previously scheduled  Recommendations for primary care physician for things to follow:  Repeat BMP  Discharge Diagnoses:  Principal Problem:   CAP (community acquired pneumonia) Active Problems:   DM   HYPERLIPIDEMIA   HYPERTENSION   COPD   Cellulitis   Acute on chronic diastolic CHF (congestive heart failure), NYHA class 1   CKD (chronic kidney disease), stage III  Discharge Condition: stable  Diet recommendation: heart healthy  Filed Weights   09/13/13 0500 09/14/13 0412 09/15/13 0636  Weight: 117.572 kg (259 lb 3.2 oz) 115.8 kg (255 lb 4.7 oz) 114.86 kg (253 lb 3.5 oz)   History of present illness:  58 year old female who has a past medical history of HYPERLIPIDEMIA; HYPERTENSION; CAD, NATIVE VESSEL; PVD; DM; COPD; Edema; H/O carotid endarterectomy; and Stroke. Today presented to the ED with shortness of breath and chest congestion for past 5 days. Patient also complains of right-sided chest pain, patient is chronically bedbound and is morbidly obese. She has been having gradual worsening of shortness of breath. She says she was able to cough and had clear phlegm. She admits to nausea but no vomiting. No diarrhea. She also denies fever. Patient has chronic leg wounds and has also been experiencing left leg pain over the past 2 days. She has not noticed that the left leg has been more red and swollen.  Hospital Course:  Pneumonia- chest x-ray shows the patient has right middle and lower lobe pneumonitis. Patient has been started on vancomycin for cellulitis and also on azithromycin which will also cover for pneumonia. She was  afebrile and with clinical improvement and her antibiotics were narrowed to Levofloxacin with continuing improvement in her respiratory status. Blood cultures were obtained and remained negative, final report pending at the time of this dictation.   Cellulitis - appreciate wound care consult, improving on Levofloxacin, will continue that for an additional 7 days. She has follow up with wound clinic.  HTN - patient with hypertensive episodes intermittently, per cardiology will make clonidine twice daily to avoid rebound hypertension.  CHF- patient has history of diastolic dysfunction, her BNP is elevated to 5091. Cardiology consulted, appreciate input. Diuresed well and was transitioned to oral Lasix on 09/13/13. Weight on admission 256 lbs >> 253 on discharge.  Hyponatremia - sodium was 127, looks more like chronic. Has been present for many months. Stable and improving.  C. KD stage III - patient has CK D. stage III, improving upon discharge, she is to follow up with her PCP in 1 week for BMP monitoring.   Diabetes mellitus - sliding scale insulin and Lantus. She had one hypoglycemic episode while in the hospital. She almost never gets hypoglycemic at home, I suspect this may be due to dietary habits. I advised her to continue monitoring her sugars and to continue her prior insulin regimen and follow up with her PCP.   Procedures:  2D echo Study Conclusions - Left ventricle: The cavity size was normal. There was mild concentric hypertrophy. Systolic function was normal. The estimated ejection fraction was 55%. Features are consistent with a pseudonormal left ventricular filling pattern, with concomitant abnormal relaxation and increased filling  pressure (grade 2 diastolic dysfunction).  Consultations:  Cardiology  Discharge Exam: Filed Vitals:   09/14/13 1509 09/14/13 2146 09/15/13 0636 09/15/13 1421  BP:  138/68 140/68 126/58  Pulse:  80 82 73  Temp:  98.1 F (36.7 C) 97.6 F (36.4 C) 97.6  F (36.4 C)  TempSrc:  Oral Other (Comment) Oral  Resp:  20 18 18   Height:      Weight:   114.86 kg (253 lb 3.5 oz)   SpO2: 99% 99% 100% 100%   General: NAD Cardiovascular: RRR Respiratory: CTA biL  Discharge Instructions   Future Appointments Provider Department Dept Phone   09/23/2013 9:00 AM [facility] [phone]   10/17/2013 8:15 AM Lewayne Bunting, MD [facility] Office [phone]       Medication List         aspirin EC 81 MG tablet  Take 81 mg by mouth daily.     cloNIDine 0.1 MG tablet  Commonly known as:  CATAPRES  Take 1 tablet (0.1 mg total) by mouth 2 (two) times daily.     furosemide 40 MG tablet  Commonly known as:  LASIX  Take 40 mg by mouth every other day.     guaiFENesin 600 MG 12 hr tablet  Commonly known as:  MUCINEX  Take 1 tablet (600 mg total) by mouth 2 (two) times daily.     insulin aspart 100 UNIT/ML injection  Commonly known as:  novoLOG  Inject 20-60 Units into the skin 3 (three) times daily before meals. DEPENDS ON WHAT SHE IS GOING TO EAT     insulin glargine 100 UNIT/ML injection  Commonly known as:  LANTUS  Inject 61 Units into the skin 2 (two) times daily.     levofloxacin 250 MG tablet  Commonly known as:  LEVAQUIN  Take 1 tablet (250 mg total) by mouth daily.     metoprolol tartrate 25 MG tablet  Commonly known as:  LOPRESSOR  Take 25 mg by mouth 2 (two) times daily.     pregabalin 75 MG capsule  Commonly known as:  LYRICA  Take 75 mg by mouth daily.     traMADol 50 MG tablet  Commonly known as:  ULTRAM  Take 50 mg by mouth every 6 (six) hours as needed. PAIN           Follow-up Information   Follow up with [name], MD. Schedule an appointment as soon as possible for a visit in 1 week. (Left a message )    Specialty:  Internal Medicine   Contact information:   [address] [place] [address] [phone]         Follow up with wound care. Schedule an appointment as soon as possible for a visit in 1 week.      Follow up with Olga Millers, MD On 10/17/2013. (8:15 AM)    Specialty:  Cardiology   Contact information:   [address] [place] [address] [phone]       The results of significant diagnostics from this hospitalization (including imaging, microbiology, ancillary and laboratory) are listed below for reference.    Significant Diagnostic Studies: Dg Chest 2 View  09/10/2013   CLINICAL DATA:  Chest pain, cough, congestion  EXAM: CHEST  2 VIEW  COMPARISON:  05/14/2010.  FINDINGS: Narrative increased density projects in the region of right middle lobe and to a  lesser extent right lower lobe. This finding partially silhouettes the right heart border. Cardiac silhouette is otherwise appears be within normal limits. There is mild prominence of the interstitial markings. No further focal regions of consolidation no further focal infiltrates. The osseous structures unremarkable.  IMPRESSION: Right middle as well as possibly right lower lobe pneumonitis. There is also prominence of the interstitial markings may reflect an interstitial component. Pulmonary vascular congestion is also diagnostic consideration.   Electronically Signed   By: Salome Holmes M.D.   On: 09/10/2013 14:14    Microbiology: Recent Results (from the past 240 hour(s))  CULTURE, BLOOD (ROUTINE X 2)     Status: None   Collection Time    09/10/13  6:15 PM      Result Value Range Status   Specimen Description BLOOD RIGHT HAND   Final   Special Requests BOTTLES DRAWN AEROBIC ONLY 5CC   Final   Culture  Setup Time     Final   Value: 09/11/2013 02:45     Performed at [facility]   Culture     Final   Value:        BLOOD CULTURE RECEIVED NO GROWTH TO DATE CULTURE WILL BE HELD FOR 5 DAYS BEFORE ISSUING A FINAL NEGATIVE REPORT     Performed at [facility]   Report Status PENDING   Incomplete   CULTURE, BLOOD (ROUTINE X 2)     Status: None   Collection Time    09/10/13  6:27 PM      Result Value Range Status   Specimen Description BLOOD LEFT HAND   Final   Special Requests BOTTLES DRAWN AEROBIC ONLY 10CC   Final   Culture  Setup Time     Final   Value: 09/11/2013 02:45     Performed at [facility]   Culture     Final   Value:        BLOOD CULTURE RECEIVED NO GROWTH TO DATE CULTURE WILL BE HELD FOR 5 DAYS BEFORE ISSUING A FINAL NEGATIVE REPORT     Performed at [facility]   Report Status PENDING   Incomplete     Labs: Basic Metabolic Panel:  Recent Labs Lab 09/10/13 1441 09/11/13 0100 09/12/13 0426 09/13/13 0440 09/13/13 1030 09/14/13 0518  NA 127* 127* 136* 133*  --  139  K 4.8 4.5 4.0 6.0* 4.1 4.0  CL 86* 89* 97 97  --  99  CO2 27 25 28 24   --  30  GLUCOSE 437* 358* 104* 224*  --  74  BUN 41* 43* 50* 43*  --  35*  CREATININE 1.74* 1.90* 2.28* 1.78*  --  1.73*  CALCIUM 9.0 8.1* 8.2* 8.4  --  8.9   Liver Function Tests:  Recent Labs Lab 09/11/13 0100  AST 16  ALT 10  ALKPHOS 100  BILITOT 0.5  PROT 7.8  ALBUMIN 2.4*   CBC:  Recent Labs Lab 09/10/13 1441 09/11/13 0100 09/12/13 0426 09/13/13 0440 09/14/13 0518  WBC 10.9* 12.5* 11.4* 11.6* 11.0*  HGB 11.7* 9.6* 9.2* 10.3* 10.2*  HCT 34.0* 28.2* 27.9* 30.7* 31.5*  MCV 82.3 84.4 86.1 87.2 88.0  PLT 239 207 203 246 243   Cardiac Enzymes:  Recent Labs Lab 09/10/13 1441 09/10/13 1827 09/11/13 0100 09/11/13 0800  TROPONINI <0.30 <0.30 <0.30 <0.30   BNP: BNP (last 3 results)  Recent Labs  09/10/13 1441 09/10/13 1827  PROBNP 5021.0* 4050.0*   CBG:  Recent Labs Lab 09/14/13 1057 09/14/13 1620 09/14/13 2153 09/15/13 0519 09/15/13 0618  GLUCAP 136* 235* 144* 40* 85   Signed:  Famous Eisenhardt  [facility] 09/15/2013, 5:22 PM

## 2013-09-15 NOTE — Progress Notes (Addendum)
Patient Name: Kimberly ManilaSusan K Tamez Date of Encounter: 09/15/2013     Principal Problem:   CAP (community acquired pneumonia) Active Problems:   Acute on chronic diastolic CHF (congestive heart failure), NYHA class 1   DM   HYPERTENSION   Cellulitis   CKD (chronic kidney disease), stage III   HYPERLIPIDEMIA   COPD   SUBJECTIVE  Breathing much improved.  Reports productive cough.  LEE improving.  CURRENT MEDS . aspirin EC  81 mg Oral Daily  . cloNIDine  0.1 mg Oral Daily  . enoxaparin (LOVENOX) injection  60 mg Subcutaneous Q24H  . feeding supplement (PRO-STAT SUGAR FREE 64)  30 mL Oral Q24H  . furosemide  40 mg Oral BID  . guaiFENesin  600 mg Oral BID  . insulin aspart  0-15 Units Subcutaneous TID WC  . insulin glargine  61 Units Subcutaneous BID  . ipratropium-albuterol  3 mL Nebulization TID  . levofloxacin  250 mg Oral Daily  . metoprolol tartrate  25 mg Oral BID  . multivitamin with minerals  1 tablet Oral Daily  . pregabalin  75 mg Oral Daily  . sodium chloride  3 mL Intravenous Q12H    OBJECTIVE  Filed Vitals:   09/14/13 1428 09/14/13 1509 09/14/13 2146 09/15/13 0636  BP: 140/57  138/68 140/68  Pulse: 77  80 82  Temp: 97.9 F (36.6 C)  98.1 F (36.7 C) 97.6 F (36.4 C)  TempSrc: Oral  Oral Other (Comment)  Resp: 18  20 18   Height:      Weight:    253 lb 3.5 oz (114.86 kg)  SpO2: 100% 99% 99% 100%    Intake/Output Summary (Last 24 hours) at 09/15/13 1039 Last data filed at 09/15/13 0915  Gross per 24 hour  Intake    930 ml  Output   1250 ml  Net   -320 ml   Filed Weights   09/13/13 0500 09/14/13 0412 09/15/13 0636  Weight: 259 lb 3.2 oz (117.572 kg) 255 lb 4.7 oz (115.8 kg) 253 lb 3.5 oz (114.86 kg)    PHYSICAL EXAM  General: Pleasant, NAD. Neuro: Alert and oriented X 3. Moves all extremities spontaneously. Psych: Normal affect. HEENT:  Normal  Neck: Supple.  Difficult to assess jvp 2/2 girth.  Soft radiated murmur noted bilat. Lungs:  Resp  regular and unlabored, few crackles left base. Heart: RRR no s3, s4, 1/6 diastolic murmur @ lusb. Abdomen: Soft, non-tender, non-distended, BS + x 4.  Extremities: No clubbing, cyanosis.  Cellulitic changes and erythema noted to bilat LE with 1-2+ edema. DP/PT/Radials 2+ and equal bilaterally.  Accessory Clinical Findings  CBC  Recent Labs  09/13/13 0440 09/14/13 0518  WBC 11.6* 11.0*  HGB 10.3* 10.2*  HCT 30.7* 31.5*  MCV 87.2 88.0  PLT 246 243   Basic Metabolic Panel  Recent Labs  09/13/13 0440 09/13/13 1030 09/14/13 0518  NA 133*  --  139  K 6.0* 4.1 4.0  CL 97  --  99  CO2 24  --  30  GLUCOSE 224*  --  74  BUN 43*  --  35*  CREATININE 1.78*  --  1.73*  CALCIUM 8.4  --  8.9   D-Dimer  Recent Labs  09/12/13 1545  DDIMER 1.34*   TELE  rsr  Radiology/Studies  Dg Chest 2 View  09/10/2013   CLINICAL DATA:  Chest pain, cough, congestion  EXAM: CHEST  2 VIEW  COMPARISON:  05/14/2010.  FINDINGS: Narrative increased  density projects in the region of right middle lobe and to a lesser extent right lower lobe. This finding partially silhouettes the right heart border. Cardiac silhouette is otherwise appears be within normal limits. There is mild prominence of the interstitial markings. No further focal regions of consolidation no further focal infiltrates. The osseous structures unremarkable.  IMPRESSION: Right middle as well as possibly right lower lobe pneumonitis. There is also prominence of the interstitial markings may reflect an interstitial component. Pulmonary vascular congestion is also diagnostic consideration.   Electronically Signed   By: Salome Holmes M.D.   On: 09/10/2013 14:14   2D Echocardiogram 1.2.2015  Study Conclusions  - Left ventricle: The cavity size was normal. There was mild   concentric hypertrophy. Systolic function was normal. The   estimated ejection fraction was 55%. Features are   consistent with a pseudonormal left ventricular  filling   pattern, with concomitant abnormal relaxation and   increased filling pressure (grade 2 diastolic   dysfunction). - Mitral valve: Calcified annulus. Mildly thickened leaflets   . Mild thickening and calcification. Mild regurgitation. _____________   ASSESSMENT AND PLAN  1.  CAP:  Abx/inhalers per IM.  2.  Acute on chronic diastolic chf:  Admission weight of 250 (? - 5 hrs later she was 256?).  Current weight 253, down from 255 yesterday and 259 the day before.  She says weights at home are typically between 250-260.  -820 yesterday.  HR stable.  BP could be better.  She is on bb and clonidine, but only 0.1mg  daily of clonidine. Would change to bid to avoid rebound hypertension.  Cont PO lasix.  3.  Stage III CKD:  Stable.  4.  Bilat LE Cellulitis (chronic): wound care seeing.  5.  DM II:  Per IM.  6.  HTN:  See #2.  Change clonidine to bid.  Signed, Nicolasa Ducking NP As above, patient seen and examined. She continues to improve. Continue present dose of Lasix and follow renal function. Continue therapy for pneumonia. Olga Millers Addendum, the patient apparently to be discharged today. Please make sure she has a followup BMET in one week and followup with me for congestive heart failure in 4 weeks. Olga Millers

## 2013-09-15 NOTE — Progress Notes (Signed)
SATURATION QUALIFICATIONS: (This note is used to comply with regulatory documentation for home oxygen)  Patient Saturations on Room Air at Rest = 89%  Patient Saturations on Room Air while Ambulating = 87%  Patient Saturations on 2 Liters of oxygen while Ambulating = 94%  Please briefly explain why patient needs home oxygen: pt needs home oxygen due to oxygen sats 87% on room air while ambulating.

## 2013-09-15 NOTE — Care Management Note (Signed)
    Page 1 of 2   09/15/2013     11:06:54 AM   CARE MANAGEMENT NOTE 09/15/2013  Patient:  [name],Kaleia K   Account Number:  [id number]  Date Initiated:  09/15/2013  Documentation initiated by:  [facility],Sadi Arave  Subjective/Objective Assessment:   58 year old female with PMHx of HYPERLIPIDEMIA; HYPERTENSION; CAD, NATIVE VESSEL; PVD; DM; COPD; Edema; H/O carotid endarterectomy; and Stroke.  Today presented to the ED with SOB and chest congestion x5 days//Home with spouse     Action/Plan:   vancomycin for cellulitis which will also cover for pneumonia. We'll also obtain strep pneumo antigen and Legionella urine antigen.Blood cultures x2 will be obtained.  [facility]   Anticipated DC Date:  09/15/2013   Anticipated DC Plan:  HOME W HOME HEALTH SERVICES      DC Planning Services  CM consult      [facility]   Choice offered to / List presented to:  C-1 Patient        HH arranged  HH-2 PT      [facility]   Status of service:  Completed, signed off Medicare Important Message given?   (If response is "NO", the following Medicare IM given date fields will be blank) Date Medicare IM given:   Date Additional Medicare IM given:    Discharge Disposition:    Per UR Regulation:    If discussed at Long Length of Stay Meetings, dates discussed:    Comments:  09/15/13 1030 Oletta Cohnamellia Renaye Janicki, RN, BSN, [employer] [phone] Spoke with pt. regarding discharge planning. CM offered pt. list of home health agencies. [facility]. Ayesha RumpfMary Yonjof, RN of [facility] notified. No DME needs identified at this time.

## 2013-09-16 LAB — GLUCOSE, CAPILLARY: GLUCOSE-CAPILLARY: 224 mg/dL — AB (ref 70–99)

## 2013-09-17 LAB — CULTURE, BLOOD (ROUTINE X 2)
Culture: NO GROWTH
Culture: NO GROWTH

## 2013-09-23 ENCOUNTER — Other Ambulatory Visit (HOSPITAL_BASED_OUTPATIENT_CLINIC_OR_DEPARTMENT_OTHER): Payer: Self-pay | Admitting: General Surgery

## 2013-09-23 ENCOUNTER — Encounter (HOSPITAL_BASED_OUTPATIENT_CLINIC_OR_DEPARTMENT_OTHER): Payer: Managed Care, Other (non HMO) | Attending: General Surgery

## 2013-09-23 ENCOUNTER — Ambulatory Visit (HOSPITAL_COMMUNITY)
Admission: RE | Admit: 2013-09-23 | Discharge: 2013-09-23 | Disposition: A | Discharge status: Home / Self Care | Payer: Managed Care, Other (non HMO) | Source: Ambulatory Visit | Attending: General Surgery | Admitting: General Surgery

## 2013-09-23 DIAGNOSIS — M869 Osteomyelitis, unspecified: Secondary | ICD-10-CM

## 2013-09-23 DIAGNOSIS — E1169 Type 2 diabetes mellitus with other specified complication: Secondary | ICD-10-CM | POA: Insufficient documentation

## 2013-09-23 DIAGNOSIS — S99929A Unspecified injury of unspecified foot, initial encounter: Principal | ICD-10-CM

## 2013-09-23 DIAGNOSIS — S8990XA Unspecified injury of unspecified lower leg, initial encounter: Secondary | ICD-10-CM | POA: Insufficient documentation

## 2013-09-23 DIAGNOSIS — I739 Peripheral vascular disease, unspecified: Secondary | ICD-10-CM | POA: Insufficient documentation

## 2013-09-23 DIAGNOSIS — E119 Type 2 diabetes mellitus without complications: Secondary | ICD-10-CM | POA: Insufficient documentation

## 2013-09-23 DIAGNOSIS — X58XXXA Exposure to other specified factors, initial encounter: Secondary | ICD-10-CM | POA: Insufficient documentation

## 2013-09-23 DIAGNOSIS — S99919A Unspecified injury of unspecified ankle, initial encounter: Principal | ICD-10-CM

## 2013-09-23 DIAGNOSIS — M7730 Calcaneal spur, unspecified foot: Secondary | ICD-10-CM | POA: Insufficient documentation

## 2013-09-23 DIAGNOSIS — L97409 Non-pressure chronic ulcer of unspecified heel and midfoot with unspecified severity: Secondary | ICD-10-CM | POA: Insufficient documentation

## 2013-09-23 NOTE — H&P (Signed)
[name]:  Kimberly [name]                 ACCOUNT NO.:  [id number]  MEDICAL RECORD NO.:  [id number]  LOCATION:  XRAY                         [facility]  PHYSICIAN:  Joanne Gaveloy Ahmari Garton, M.D.        DATE OF BIRTH:  October 16, 1955  DATE OF ADMISSION:  09/23/2013 DATE OF DISCHARGE:                             HISTORY & PHYSICAL   CHIEF COMPLAINT:  Sore, right heel.  HISTORY OF PRESENT ILLNESS:  This is a diabetic female with many medical problems, was in hospital for pneumonia in January 2015.  She has had an ulcer of her right heel for several months.  PAST MEDICAL HISTORY:  Significant for type 2 diabetes, morbid obesity, neuropathy, recent pneumonia, hypercholesterolemia, hypertension, glaucoma, venous insufficiency, and a history of stroke.  PAST SURGICAL HISTORY:  Left-sided carotid endarterectomy done twice, right vitrectomy and laser surgery of right eye.  SOCIAL HISTORY:  Cigarettes none for 5 years.  Alcohol none.  MEDICATIONS:  Lasix, insulin, Lantus, metoprolol, Lyrica, tramadol, clonidine, multivitamins.  REVIEW OF SYSTEMS:  Essentially as above.  PHYSICAL EXAMINATION:  VITAL SIGNS:  Temperature 98.3 pulse 96, regular, respirations 18, blood pressure 144/80.  Glucose is 179. GENERAL APPEARANCE:  A well developed, obese, no distress. CHEST:  Clear. HEART:  Regular rhythm. ABDOMEN:  Not examined.  Distal pulses are not palpable.  ABI measured, 0.75 on the right lower extremity.  On the right heel, there is a 2.0 x 3.7 open wound, which is covered with adherent slough, which could not be cleared with a curette.  This is still relatively superficial.  Both legs show signs of chronic venous hypertension.  IMPRESSION:  Diabetic ulcer right heel Wagner 2 with an element of decubitus ulcer from previous treatment of strokes and vascular surgery, possible peripheral vascular disease.  We will start treatment with Santyl and Hydrogel.  We will get x-ray of the heel to rule  out osteomyelitis and get the arterial studies.     Joanne Gaveloy Ahmadou Bolz, M.D.     RA/MEDQ  D:  09/23/2013  T:  09/23/2013  Job:  [id number]

## 2013-09-24 ENCOUNTER — Other Ambulatory Visit (HOSPITAL_COMMUNITY): Payer: Self-pay | Admitting: General Surgery

## 2013-09-24 DIAGNOSIS — I739 Peripheral vascular disease, unspecified: Secondary | ICD-10-CM

## 2013-09-25 ENCOUNTER — Ambulatory Visit
Admission: RE | Admit: 2013-09-25 | Discharge: 2013-09-25 | Disposition: A | Discharge status: Home / Self Care | Payer: Managed Care, Other (non HMO) | Source: Ambulatory Visit | Attending: Geriatric Medicine | Admitting: Geriatric Medicine

## 2013-09-25 ENCOUNTER — Other Ambulatory Visit: Payer: Self-pay | Admitting: Geriatric Medicine

## 2013-09-25 DIAGNOSIS — J159 Unspecified bacterial pneumonia: Secondary | ICD-10-CM

## 2013-09-29 ENCOUNTER — Encounter (HOSPITAL_COMMUNITY): Payer: Managed Care, Other (non HMO)

## 2013-09-30 ENCOUNTER — Ambulatory Visit (HOSPITAL_COMMUNITY)
Admission: RE | Admit: 2013-09-30 | Discharge: 2013-09-30 | Disposition: A | Discharge status: Home / Self Care | Payer: Managed Care, Other (non HMO) | Source: Ambulatory Visit | Attending: Internal Medicine | Admitting: Internal Medicine

## 2013-09-30 DIAGNOSIS — L98499 Non-pressure chronic ulcer of skin of other sites with unspecified severity: Principal | ICD-10-CM | POA: Insufficient documentation

## 2013-09-30 DIAGNOSIS — L97409 Non-pressure chronic ulcer of unspecified heel and midfoot with unspecified severity: Secondary | ICD-10-CM | POA: Insufficient documentation

## 2013-09-30 DIAGNOSIS — I739 Peripheral vascular disease, unspecified: Secondary | ICD-10-CM | POA: Insufficient documentation

## 2013-09-30 NOTE — Progress Notes (Signed)
Lower Extremity Arterial Duplex Completed. °Brianna L Mazza,RVT °

## 2013-10-13 ENCOUNTER — Inpatient Hospital Stay (HOSPITAL_COMMUNITY)
Admission: EM | Admit: 2013-10-13 | Discharge: 2013-10-16 | DRG: 292 | Disposition: A | Discharge status: Home Health | Payer: Managed Care, Other (non HMO) | Attending: Internal Medicine | Admitting: Internal Medicine

## 2013-10-13 ENCOUNTER — Emergency Department (HOSPITAL_COMMUNITY): Payer: Managed Care, Other (non HMO)

## 2013-10-13 ENCOUNTER — Encounter (HOSPITAL_COMMUNITY): Payer: Self-pay | Admitting: Emergency Medicine

## 2013-10-13 DIAGNOSIS — L0291 Cutaneous abscess, unspecified: Secondary | ICD-10-CM

## 2013-10-13 DIAGNOSIS — I509 Heart failure, unspecified: Secondary | ICD-10-CM | POA: Diagnosis present

## 2013-10-13 DIAGNOSIS — I5032 Chronic diastolic (congestive) heart failure: Principal | ICD-10-CM | POA: Diagnosis present

## 2013-10-13 DIAGNOSIS — Z87891 Personal history of nicotine dependence: Secondary | ICD-10-CM

## 2013-10-13 DIAGNOSIS — J9610 Chronic respiratory failure, unspecified whether with hypoxia or hypercapnia: Secondary | ICD-10-CM

## 2013-10-13 DIAGNOSIS — L0390 Cellulitis, unspecified: Secondary | ICD-10-CM | POA: Diagnosis present

## 2013-10-13 DIAGNOSIS — I10 Essential (primary) hypertension: Secondary | ICD-10-CM

## 2013-10-13 DIAGNOSIS — L02419 Cutaneous abscess of limb, unspecified: Secondary | ICD-10-CM | POA: Diagnosis present

## 2013-10-13 DIAGNOSIS — E1129 Type 2 diabetes mellitus with other diabetic kidney complication: Secondary | ICD-10-CM | POA: Diagnosis present

## 2013-10-13 DIAGNOSIS — I119 Hypertensive heart disease without heart failure: Secondary | ICD-10-CM | POA: Diagnosis present

## 2013-10-13 DIAGNOSIS — N183 Chronic kidney disease, stage 3 unspecified: Secondary | ICD-10-CM | POA: Diagnosis present

## 2013-10-13 DIAGNOSIS — Z794 Long term (current) use of insulin: Secondary | ICD-10-CM

## 2013-10-13 DIAGNOSIS — E119 Type 2 diabetes mellitus without complications: Secondary | ICD-10-CM | POA: Diagnosis present

## 2013-10-13 DIAGNOSIS — R609 Edema, unspecified: Secondary | ICD-10-CM

## 2013-10-13 DIAGNOSIS — R0902 Hypoxemia: Secondary | ICD-10-CM

## 2013-10-13 DIAGNOSIS — J9611 Chronic respiratory failure with hypoxia: Secondary | ICD-10-CM | POA: Diagnosis present

## 2013-10-13 DIAGNOSIS — I2510 Atherosclerotic heart disease of native coronary artery without angina pectoris: Secondary | ICD-10-CM | POA: Diagnosis present

## 2013-10-13 DIAGNOSIS — Z9981 Dependence on supplemental oxygen: Secondary | ICD-10-CM

## 2013-10-13 DIAGNOSIS — I5033 Acute on chronic diastolic (congestive) heart failure: Secondary | ICD-10-CM

## 2013-10-13 DIAGNOSIS — J4489 Other specified chronic obstructive pulmonary disease: Secondary | ICD-10-CM | POA: Diagnosis present

## 2013-10-13 DIAGNOSIS — I739 Peripheral vascular disease, unspecified: Secondary | ICD-10-CM

## 2013-10-13 DIAGNOSIS — E785 Hyperlipidemia, unspecified: Secondary | ICD-10-CM | POA: Diagnosis present

## 2013-10-13 DIAGNOSIS — E1169 Type 2 diabetes mellitus with other specified complication: Secondary | ICD-10-CM | POA: Diagnosis present

## 2013-10-13 DIAGNOSIS — Z7982 Long term (current) use of aspirin: Secondary | ICD-10-CM

## 2013-10-13 DIAGNOSIS — I5030 Unspecified diastolic (congestive) heart failure: Secondary | ICD-10-CM

## 2013-10-13 DIAGNOSIS — F17200 Nicotine dependence, unspecified, uncomplicated: Secondary | ICD-10-CM

## 2013-10-13 DIAGNOSIS — I129 Hypertensive chronic kidney disease with stage 1 through stage 4 chronic kidney disease, or unspecified chronic kidney disease: Secondary | ICD-10-CM | POA: Diagnosis present

## 2013-10-13 DIAGNOSIS — J189 Pneumonia, unspecified organism: Secondary | ICD-10-CM

## 2013-10-13 DIAGNOSIS — L03119 Cellulitis of unspecified part of limb: Secondary | ICD-10-CM

## 2013-10-13 DIAGNOSIS — Z8673 Personal history of transient ischemic attack (TIA), and cerebral infarction without residual deficits: Secondary | ICD-10-CM

## 2013-10-13 DIAGNOSIS — J449 Chronic obstructive pulmonary disease, unspecified: Secondary | ICD-10-CM

## 2013-10-13 HISTORY — DX: Other complications of anesthesia, initial encounter: T88.59XA

## 2013-10-13 HISTORY — DX: Cellulitis, unspecified: L03.90

## 2013-10-13 HISTORY — DX: Adverse effect of unspecified anesthetic, initial encounter: T41.45XA

## 2013-10-13 LAB — COMPREHENSIVE METABOLIC PANEL
ALBUMIN: 3.1 g/dL — AB (ref 3.5–5.2)
ALT: 13 U/L (ref 0–35)
AST: 16 U/L (ref 0–37)
Alkaline Phosphatase: 139 U/L — ABNORMAL HIGH (ref 39–117)
BUN: 32 mg/dL — ABNORMAL HIGH (ref 6–23)
CO2: 27 mEq/L (ref 19–32)
CREATININE: 1.77 mg/dL — AB (ref 0.50–1.10)
Calcium: 9 mg/dL (ref 8.4–10.5)
Chloride: 97 mEq/L (ref 96–112)
GFR calc Af Amer: 35 mL/min — ABNORMAL LOW (ref >90)
GFR, EST NON AFRICAN AMERICAN: 31 mL/min — AB (ref >90)
Glucose, Bld: 74 mg/dL (ref 70–99)
Potassium: 4.7 mEq/L (ref 3.7–5.3)
SODIUM: 138 meq/L (ref 137–147)
TOTAL PROTEIN: 8.8 g/dL — AB (ref 6.0–8.3)
Total Bilirubin: 0.4 mg/dL (ref 0.3–1.2)

## 2013-10-13 LAB — URINALYSIS, ROUTINE W REFLEX MICROSCOPIC
BILIRUBIN URINE: NEGATIVE
GLUCOSE, UA: NEGATIVE mg/dL
Ketones, ur: NEGATIVE mg/dL
Nitrite: NEGATIVE
Protein, ur: NEGATIVE mg/dL
SPECIFIC GRAVITY, URINE: 1.005 (ref 1.005–1.030)
UROBILINOGEN UA: 0.2 mg/dL (ref 0.0–1.0)
pH: 6.5 (ref 5.0–8.0)

## 2013-10-13 LAB — URINE MICROSCOPIC-ADD ON

## 2013-10-13 LAB — CBC WITH DIFFERENTIAL/PLATELET
BASOS ABS: 0 10*3/uL (ref 0.0–0.1)
Basophils Relative: 0 % (ref 0–1)
Eosinophils Absolute: 0.2 10*3/uL (ref 0.0–0.7)
Eosinophils Relative: 1 % (ref 0–5)
HCT: 33.4 % — ABNORMAL LOW (ref 36.0–46.0)
Hemoglobin: 11.1 g/dL — ABNORMAL LOW (ref 12.0–15.0)
LYMPHS PCT: 17 % (ref 12–46)
Lymphs Abs: 2.6 10*3/uL (ref 0.7–4.0)
MCH: 29.8 pg (ref 26.0–34.0)
MCHC: 33.2 g/dL (ref 30.0–36.0)
MCV: 89.5 fL (ref 78.0–100.0)
Monocytes Absolute: 0.5 10*3/uL (ref 0.1–1.0)
Monocytes Relative: 3 % (ref 3–12)
Neutro Abs: 12 10*3/uL — ABNORMAL HIGH (ref 1.7–7.7)
Neutrophils Relative %: 78 % — ABNORMAL HIGH (ref 43–77)
PLATELETS: 251 10*3/uL (ref 150–400)
RBC: 3.73 MIL/uL — ABNORMAL LOW (ref 3.87–5.11)
RDW: 16.5 % — AB (ref 11.5–15.5)
WBC: 15.3 10*3/uL — AB (ref 4.0–10.5)

## 2013-10-13 LAB — GLUCOSE, CAPILLARY
GLUCOSE-CAPILLARY: 57 mg/dL — AB (ref 70–99)
Glucose-Capillary: 106 mg/dL — ABNORMAL HIGH (ref 70–99)

## 2013-10-13 LAB — PRO B NATRIURETIC PEPTIDE: PRO B NATRI PEPTIDE: 1519 pg/mL — AB (ref 0–125)

## 2013-10-13 MED ORDER — METOPROLOL TARTRATE 25 MG PO TABS
25.0000 mg | ORAL_TABLET | Freq: Two times a day (BID) | ORAL | Status: DC
  Administered 2013-10-13 – 2013-10-16 (×6): 25 mg via ORAL
  Filled 2013-10-13 (×7): qty 1

## 2013-10-13 MED ORDER — INSULIN ASPART 100 UNIT/ML ~~LOC~~ SOLN: 20.0000 [IU] | Freq: Three times a day (TID) | SUBCUTANEOUS | Status: DC

## 2013-10-13 MED ORDER — ALBUTEROL SULFATE (2.5 MG/3ML) 0.083% IN NEBU: 2.5000 mg | INHALATION_SOLUTION | Freq: Four times a day (QID) | RESPIRATORY_TRACT | Status: DC | PRN

## 2013-10-13 MED ORDER — VANCOMYCIN HCL 10 G IV SOLR
1250.0000 mg | INTRAVENOUS | Status: DC
  Administered 2013-10-14 – 2013-10-15 (×2): 1250 mg via INTRAVENOUS
  Filled 2013-10-13 (×3): qty 1250

## 2013-10-13 MED ORDER — PREGABALIN 50 MG PO CAPS
75.0000 mg | ORAL_CAPSULE | Freq: Every evening | ORAL | Status: DC
  Administered 2013-10-14 – 2013-10-15 (×3): 75 mg via ORAL
  Filled 2013-10-13 (×6): qty 1

## 2013-10-13 MED ORDER — FUROSEMIDE 10 MG/ML IJ SOLN
40.0000 mg | Freq: Every day | INTRAMUSCULAR | Status: DC
  Administered 2013-10-14: 40 mg via INTRAVENOUS
  Filled 2013-10-13: qty 4

## 2013-10-13 MED ORDER — CLONIDINE HCL 0.1 MG PO TABS
0.1000 mg | ORAL_TABLET | Freq: Two times a day (BID) | ORAL | Status: DC
  Administered 2013-10-13 – 2013-10-16 (×6): 0.1 mg via ORAL
  Filled 2013-10-13 (×7): qty 1

## 2013-10-13 MED ORDER — SODIUM CHLORIDE 0.9 % IJ SOLN: 3.0000 mL | INTRAMUSCULAR | Status: DC | PRN

## 2013-10-13 MED ORDER — ASPIRIN EC 81 MG PO TBEC
81.0000 mg | DELAYED_RELEASE_TABLET | Freq: Every day | ORAL | Status: DC
  Administered 2013-10-14 – 2013-10-16 (×3): 81 mg via ORAL
  Filled 2013-10-13 (×3): qty 1

## 2013-10-13 MED ORDER — INSULIN GLARGINE 100 UNIT/ML ~~LOC~~ SOLN
61.0000 [IU] | Freq: Two times a day (BID) | SUBCUTANEOUS | Status: DC
  Administered 2013-10-14: 61 [IU] via SUBCUTANEOUS
  Filled 2013-10-13 (×3): qty 0.61

## 2013-10-13 MED ORDER — FUROSEMIDE 10 MG/ML IJ SOLN
40.0000 mg | Freq: Once | INTRAMUSCULAR | Status: AC
  Administered 2013-10-13: 40 mg via INTRAVENOUS
  Filled 2013-10-13: qty 4

## 2013-10-13 MED ORDER — SODIUM CHLORIDE 0.9 % IV SOLN
1250.0000 mg | Freq: Two times a day (BID) | INTRAVENOUS | Status: DC
  Filled 2013-10-13: qty 1250

## 2013-10-13 MED ORDER — SODIUM CHLORIDE 0.9 % IV SOLN
250.0000 mL | INTRAVENOUS | Status: DC | PRN
Start: 2013-10-13 — End: 2013-10-16
  Administered 2013-10-15: 250 mL via INTRAVENOUS

## 2013-10-13 MED ORDER — ENOXAPARIN SODIUM 60 MG/0.6ML ~~LOC~~ SOLN
60.0000 mg | SUBCUTANEOUS | Status: DC
  Administered 2013-10-14 – 2013-10-15 (×2): 60 mg via SUBCUTANEOUS
  Filled 2013-10-13 (×3): qty 0.6

## 2013-10-13 MED ORDER — SODIUM CHLORIDE 0.9 % IJ SOLN
3.0000 mL | Freq: Two times a day (BID) | INTRAMUSCULAR | Status: DC
  Administered 2013-10-13 – 2013-10-15 (×4): 3 mL via INTRAVENOUS

## 2013-10-13 MED ORDER — TRAMADOL HCL 50 MG PO TABS
50.0000 mg | ORAL_TABLET | Freq: Four times a day (QID) | ORAL | Status: DC | PRN
  Administered 2013-10-13 – 2013-10-15 (×3): 50 mg via ORAL
  Filled 2013-10-13 (×3): qty 1

## 2013-10-13 MED ORDER — ALBUTEROL SULFATE HFA 108 (90 BASE) MCG/ACT IN AERS: 1.0000 | INHALATION_SPRAY | Freq: Four times a day (QID) | RESPIRATORY_TRACT | Status: DC | PRN

## 2013-10-13 MED ORDER — VANCOMYCIN HCL IN DEXTROSE 1-5 GM/200ML-% IV SOLN
1000.0000 mg | Freq: Once | INTRAVENOUS | Status: AC
  Administered 2013-10-14: 1000 mg via INTRAVENOUS
  Filled 2013-10-13: qty 200

## 2013-10-13 MED ORDER — VANCOMYCIN HCL IN DEXTROSE 1-5 GM/200ML-% IV SOLN
1000.0000 mg | Freq: Once | INTRAVENOUS | Status: AC
  Administered 2013-10-13: 1000 mg via INTRAVENOUS
  Filled 2013-10-13: qty 200

## 2013-10-13 NOTE — H&P (Signed)
PCP:   Ginette OttoSTONEKING,[name] THOMAS, MD   Chief Complaint:  Leg swelling and redness  HPI: 58 yo female with chronic diastolic chf, dm, hld, crf on 2 L o2 cont at home, copd comes in with worsening swelling ble esp left more than right (right has been wrapped and being treated for chronic wound which is improving) but left is more swollen than usual and more red.  Nofevers.  Pt has been on doxy for 2 weeks for the wound to rle.  No sob.  No cough.    Review of Systems:  Positive and negative as per HPI otherwise all other systems are negative  Past Medical History: Past Medical History  Diagnosis Date  . HYPERLIPIDEMIA   . HYPERTENSION   . CAD, NATIVE VESSEL   . PVD   . DM   . COPD   . Edema   . H/O carotid endarterectomy   . Stroke   . CHF (congestive heart failure)    Past Surgical History  Procedure Laterality Date  . Eye surgery    . Carotid endarterectomy      Medications: Prior to Admission medications   Medication Sig Start Date End Date Taking? Authorizing Provider  albuterol (PROVENTIL HFA;VENTOLIN HFA) 108 (90 BASE) MCG/ACT inhaler Inhale 1-2 puffs into the lungs every 6 (six) hours as needed for wheezing or shortness of breath.   Yes Historical Provider, MD  albuterol (PROVENTIL) (2.5 MG/3ML) 0.083% nebulizer solution Take 2.5 mg by nebulization every 6 (six) hours as needed for wheezing or shortness of breath.   Yes Historical Provider, MD  aspirin EC 81 MG tablet Take 81 mg by mouth daily.   Yes Historical Provider, MD  cloNIDine (CATAPRES) 0.1 MG tablet Take 0.1 mg by mouth 2 (two) times daily.   Yes Historical Provider, MD  doxycycline (VIBRAMYCIN) 100 MG capsule Take 100 mg by mouth 2 (two) times daily.   Yes Historical Provider, MD  furosemide (LASIX) 40 MG tablet Take 40 mg by mouth every other day.   Yes Historical Provider, MD  insulin aspart (NOVOLOG) 100 UNIT/ML injection Inject 20-60 Units into the skin 3 (three) times daily before meals. DEPENDS ON WHAT SHE  IS GOING TO EAT   Yes Historical Provider, MD  insulin glargine (LANTUS) 100 UNIT/ML injection Inject 61 Units into the skin 2 (two) times daily.    Yes Historical Provider, MD  metoprolol tartrate (LOPRESSOR) 25 MG tablet Take 25 mg by mouth 2 (two) times daily.   Yes Historical Provider, MD  Multiple Vitamins-Minerals (MULTIVITAMIN PO) Take 1 tablet by mouth daily.   Yes Historical Provider, MD  pregabalin (LYRICA) 75 MG capsule Take 75 mg by mouth every evening.    Yes Historical Provider, MD  traMADol (ULTRAM) 50 MG tablet Take 50 mg by mouth every 6 (six) hours as needed for moderate pain.    Yes Historical Provider, MD    Allergies:   Allergies  Allergen Reactions  . Epinephrine     Increased heart rate    Social History:  reports that she has quit smoking. She does not have any smokeless tobacco history on file. She reports that she does not drink alcohol or use illicit drugs.  Family History: none  Physical Exam: Filed Vitals:   10/13/13 1750 10/13/13 1942 10/13/13 2030 10/13/13 2100  BP:  162/64 157/58 174/72  Pulse:  85 88 87  Temp:      TempSrc:      Resp:  22 19 13  Height:  5\' 4"  (1.626 m)    Weight:  124.739 kg (275 lb)    SpO2: 98% 99% 96% 98%   General appearance: alert, cooperative and no distress Head: Normocephalic, without obvious abnormality, atraumatic Eyes: negative Nose: Nares normal. Septum midline. Mucosa normal. No drainage or sinus tenderness. Neck: no JVD and supple, symmetrical, trachea midline Lungs: clear to auscultation bilaterally Heart: regular rate and rhythm, S1, S2 normal, no murmur, click, rub or gallop Abdomen: soft, non-tender; bowel sounds normal; no masses,  no organomegaly Extremities: extremities normal, atraumatic, no cyanosis.  2 + ble edema Pulses: 2+ and symmetric Skin: Skin color, texture, turgor normal. No rashes or lesions x erythema to lle c/w cellulitis, no skin breakdown   Neurologic: Grossly normal    Labs on  Admission:   Recent Labs  10/13/13 1916  NA 138  K 4.7  CL 97  CO2 27  GLUCOSE 74  BUN 32*  CREATININE 1.77*  CALCIUM 9.0    Recent Labs  10/13/13 1916  AST 16  ALT 13  ALKPHOS 139*  BILITOT 0.4  PROT 8.8*  ALBUMIN 3.1*    Recent Labs  10/13/13 1916  WBC 15.3*  NEUTROABS 12.0*  HGB 11.1*  HCT 33.4*  MCV 89.5  PLT 251    Radiological Exams on Admission:  cxr pending  Assessment/Plan 58 yo female with lle cellulitis on abx orally for wounds  Principal Problem:   Cellulitis-  Place on vanco.  Place on iv lasix.  Active Problems:   DM  Cont insulin   HYPERLIPIDEMIA  stable   HYPERTENSION  stable   CAD, NATIVE VESSEL  stable   PVD  stable   COPD  Stable cont oxygen   Edema  Place on iv lasix, contributing to main issue   Diastolic CHF  Compensated at this time   CKD (chronic kidney disease), stage III  Monitor closely with increaseing lasix dosing   Chronic respiratory failure with hypoxia  Stable,   On home oxygen therapy stable    Akeria Hedstrom A 10/13/2013, 10:04 PM

## 2013-10-13 NOTE — ED Notes (Signed)
Pt has BLE and has wound on right foot that is being treated for at wound center and takes abx.  Pt is diabetic and has circulatory problems.  Pt reports redness and warmth that could be related to cellulitis.  Pulses palpable.  Pt has been having small amounts of urine output and reports some burning with urination and has kidney area pain in back

## 2013-10-13 NOTE — ED Notes (Signed)
MD informed of pt's CBG.

## 2013-10-13 NOTE — ED Notes (Signed)
Admitting MD at BS.  

## 2013-10-13 NOTE — Progress Notes (Signed)
ANTIBIOTIC CONSULT NOTE - INITIAL  Pharmacy Consult for vancomycin Indication: cellulitis  Allergies  Allergen Reactions  . Epinephrine     Increased heart rate    Patient Measurements: Height: 5\' 4"  (162.6 cm) Weight: 275 lb (124.739 kg) IBW/kg (Calculated) : 54.7  Vital Signs: Temp: 98 F (36.7 C) (02/02 1523) Temp src: Oral (02/02 1523) BP: 138/73 mmHg (02/02 2245) Pulse Rate: 86 (02/02 2245)  Labs:  Recent Labs  10/13/13 1916  WBC 15.3*  HGB 11.1*  PLT 251  CREATININE 1.77*   Estimated Creatinine Clearance: 45.2 ml/min (by C-G formula based on Cr of 1.77).   Microbiology: No results found for this or any previous visit (from the past 720 hour(s)).  Medical History: Past Medical History  Diagnosis Date  . HYPERLIPIDEMIA   . HYPERTENSION   . CAD, NATIVE VESSEL   . PVD   . DM   . COPD   . Edema   . H/O carotid endarterectomy   . Stroke   . CHF (congestive heart failure)     Medications:  Prescriptions prior to admission  Medication Sig Dispense Refill  . albuterol (PROVENTIL HFA;VENTOLIN HFA) 108 (90 BASE) MCG/ACT inhaler Inhale 1-2 puffs into the lungs every 6 (six) hours as needed for wheezing or shortness of breath.      Marland Kitchen albuterol (PROVENTIL) (2.5 MG/3ML) 0.083% nebulizer solution Take 2.5 mg by nebulization every 6 (six) hours as needed for wheezing or shortness of breath.      Marland Kitchen aspirin EC 81 MG tablet Take 81 mg by mouth daily.      . cloNIDine (CATAPRES) 0.1 MG tablet Take 0.1 mg by mouth 2 (two) times daily.      Marland Kitchen doxycycline (VIBRAMYCIN) 100 MG capsule Take 100 mg by mouth 2 (two) times daily.      . furosemide (LASIX) 40 MG tablet Take 40 mg by mouth every other day.      . insulin aspart (NOVOLOG) 100 UNIT/ML injection Inject 20-60 Units into the skin 3 (three) times daily before meals. DEPENDS ON WHAT SHE IS GOING TO EAT      . insulin glargine (LANTUS) 100 UNIT/ML injection Inject 61 Units into the skin 2 (two) times daily.       .  metoprolol tartrate (LOPRESSOR) 25 MG tablet Take 25 mg by mouth 2 (two) times daily.      . Multiple Vitamins-Minerals (MULTIVITAMIN PO) Take 1 tablet by mouth daily.      . pregabalin (LYRICA) 75 MG capsule Take 75 mg by mouth every evening.       . traMADol (ULTRAM) 50 MG tablet Take 50 mg by mouth every 6 (six) hours as needed for moderate pain.        Scheduled:  . [START ON 10/14/2013] aspirin EC  81 mg Oral Daily  . cloNIDine  0.1 mg Oral BID  . enoxaparin (LOVENOX) injection  40 mg Subcutaneous Q24H  . [START ON 10/14/2013] furosemide  40 mg Intravenous Daily  . [START ON 10/14/2013] insulin aspart  20-60 Units Subcutaneous TID AC  . insulin glargine  61 Units Subcutaneous BID  . metoprolol tartrate  25 mg Oral BID  . [START ON 10/14/2013] pregabalin  75 mg Oral QPM  . sodium chloride  3 mL Intravenous Q12H  . [START ON 10/14/2013] vancomycin  1,250 mg Intravenous Q12H  . vancomycin  1,000 mg Intravenous Once    Assessment: 58yo female c/o redness and warmth at right foot wound being tx'd  at wound center, concerning for cellulitis, to begin IV ABX.  Goal of Therapy:  Vancomycin trough level 10-15 mcg/ml  Plan:  Rec'd vanc 1g in ED; will give additional vancomycin 1000mg  for total load of 2g then begin 1250mg  IV Q24H and monitor CBC, Cx, levels prn.  Vernard GamblesVeronda Vu Liebman, PharmD, BCPS  10/13/2013,11:36 PM

## 2013-10-13 NOTE — ED Notes (Signed)
Pt given [place] sandwich, OJ, applesauce, and crackers.

## 2013-10-13 NOTE — ED Notes (Signed)
Pt reports she feels lightheaded, will check CBG.

## 2013-10-13 NOTE — ED Provider Notes (Signed)
CSN: [id number]     Arrival date & time 10/13/13  1516 History   First MD Initiated Contact with Patient 10/13/13 1914     Chief Complaint  Patient presents with  . Leg Swelling   (Consider location/radiation/quality/duration/timing/severity/associated sxs/prior Treatment) HPI Comments: Pt here for multiple c/o.  Over the last 1 week she has had progressive edema of the abd and legs and also feels like she is having minimal urination and feels she is not emptying her bladder fully.  She is taking lasix every other day for the last 2 months due to poor kidney function.  She denies fever but has noticed mild achy pain in bilateral flanks for the last 3 days.  She denies any worsening SOB but she does wear O2 chronically.   When home health came today they were concerned for possible infection to the LLE due to redness and warmth.  Pt states the redness looks about the same but her legs are twice the size as they were 2 weeks ago.  Pt does not weigh regularly but feels she has gained weight.  She denies any CP, fever, N/V or diarrhea.  The history is provided by the patient.    Past Medical History  Diagnosis Date  . HYPERLIPIDEMIA   . HYPERTENSION   . CAD, NATIVE VESSEL   . PVD   . DM   . COPD   . Edema   . H/O carotid endarterectomy   . Stroke   . CHF (congestive heart failure)    Past Surgical History  Procedure Laterality Date  . Eye surgery    . Carotid endarterectomy     No family history on file. History  Substance Use Topics  . Smoking status: Former Games developer  . Smokeless tobacco: Not on file  . Alcohol Use: No   OB History   Grav Para Term Preterm Abortions TAB SAB Ect Mult Living                 Review of Systems  Constitutional: Negative for fever.  Respiratory: Negative for cough, chest tightness and shortness of breath.   Gastrointestinal: Positive for abdominal distention. Negative for nausea, vomiting, abdominal pain and diarrhea.  Genitourinary: Positive for  frequency, flank pain and difficulty urinating. Negative for dysuria.  All other systems reviewed and are negative.    Allergies  Epinephrine  Home Medications   Current Outpatient Rx  Name  Route  Sig  Dispense  Refill  . albuterol (PROVENTIL HFA;VENTOLIN HFA) 108 (90 BASE) MCG/ACT inhaler   Inhalation   Inhale 1-2 puffs into the lungs every 6 (six) hours as needed for wheezing or shortness of breath.         Marland Kitchen albuterol (PROVENTIL) (2.5 MG/3ML) 0.083% nebulizer solution   Nebulization   Take 2.5 mg by nebulization every 6 (six) hours as needed for wheezing or shortness of breath.         Marland Kitchen aspirin EC 81 MG tablet   Oral   Take 81 mg by mouth daily.         . cloNIDine (CATAPRES) 0.1 MG tablet   Oral   Take 0.1 mg by mouth 2 (two) times daily.         Marland Kitchen doxycycline (VIBRAMYCIN) 100 MG capsule   Oral   Take 100 mg by mouth 2 (two) times daily.         . furosemide (LASIX) 40 MG tablet   Oral   Take 40 mg by  mouth every other day.         . insulin aspart (NOVOLOG) 100 UNIT/ML injection   Subcutaneous   Inject 20-60 Units into the skin 3 (three) times daily before meals. DEPENDS ON WHAT SHE IS GOING TO EAT         . insulin glargine (LANTUS) 100 UNIT/ML injection   Subcutaneous   Inject 61 Units into the skin 2 (two) times daily.          . metoprolol tartrate (LOPRESSOR) 25 MG tablet   Oral   Take 25 mg by mouth 2 (two) times daily.         . Multiple Vitamins-Minerals (MULTIVITAMIN PO)   Oral   Take 1 tablet by mouth daily.         . pregabalin (LYRICA) 75 MG capsule   Oral   Take 75 mg by mouth every evening.          . traMADol (ULTRAM) 50 MG tablet   Oral   Take 50 mg by mouth every 6 (six) hours as needed for moderate pain.           BP 162/64  Pulse 85  Temp(Src) 98 F (36.7 C) (Oral)  Resp 22  Ht 5\' 4"  (1.626 m)  Wt 275 lb (124.739 kg)  BMI 47.18 kg/m2  SpO2 99% Physical Exam  Nursing note and vitals  reviewed. Constitutional: She is oriented to person, place, and time. She appears well-developed and well-nourished. No distress.  HENT:  Head: Normocephalic and atraumatic.  Mouth/Throat: Oropharynx is clear and moist.  Eyes: Conjunctivae and EOM are normal. Pupils are equal, round, and reactive to light.  Neck: Normal range of motion. Neck supple.  Cardiovascular: Normal rate, regular rhythm and intact distal pulses.   No murmur heard. Pulmonary/Chest: Effort normal. No respiratory distress. She has no wheezes. She has rhonchi in the right lower field and the left lower field. She has no rales.  Abdominal: Soft. She exhibits distension. There is no tenderness. There is no rebound and no guarding.  Morbid obesity but appears distended with fluid present  Musculoskeletal: Normal range of motion. She exhibits edema. She exhibits no tenderness.  3+ pitting edema of bilateral lower ext up to the knee.  No weeping at this point.  Pt has erythema of bilateral legs but no tenderness.  Warmth of the left lower ext and erythema more severe than right.  Erythema goes up to the proximal tibia  Neurological: She is alert and oriented to person, place, and time.  Skin: Skin is warm and dry. No rash noted. No erythema.  Psychiatric: She has a normal mood and affect. Her behavior is normal.    ED Course  Procedures (including critical care time) Labs Review Labs Reviewed  CBC WITH DIFFERENTIAL - Abnormal; Notable for the following:    WBC 15.3 (*)    RBC 3.73 (*)    Hemoglobin 11.1 (*)    HCT 33.4 (*)    RDW 16.5 (*)    Neutrophils Relative % 78 (*)    Neutro Abs 12.0 (*)    All other components within normal limits  COMPREHENSIVE METABOLIC PANEL - Abnormal; Notable for the following:    BUN 32 (*)    Creatinine, Ser 1.77 (*)    Total Protein 8.8 (*)    Albumin 3.1 (*)    Alkaline Phosphatase 139 (*)    GFR calc non Af Amer 31 (*)    GFR calc Af Denyse Dago  35 (*)    All other components within  normal limits  URINALYSIS, ROUTINE W REFLEX MICROSCOPIC - Abnormal; Notable for the following:    Hgb urine dipstick TRACE (*)    Leukocytes, UA MODERATE (*)    All other components within normal limits  PRO B NATRIURETIC PEPTIDE - Abnormal; Notable for the following:    Pro B Natriuretic peptide (BNP) 1519.0 (*)    All other components within normal limits  GLUCOSE, CAPILLARY - Abnormal; Notable for the following:    Glucose-Capillary 57 (*)    All other components within normal limits  URINE MICROSCOPIC-ADD ON   Imaging Review No results found.  EKG Interpretation    Date/Time:  Monday October 13 2013 20:00:24 EST Ventricular Rate:  83 PR Interval:  170 QRS Duration: 143 QT Interval:  442 QTC Calculation: 519 R Axis:   -9 Text Interpretation:  Sinus rhythm Left bundle branch block Baseline wander in lead(s) V2 No significant change since last tracing Confirmed by Anitra LauthPLUNKETT  MD, Nyree Yonker ([id number]) on 10/13/2013 8:34:46 PM            MDM   1. CHF (congestive heart failure)   2. Cellulitis     Pt presents with hx of DM, CAD, CHF and CRI who presents with signs of worsening fluid overload with urinary sx.  Concern for possible worsening renal function, UTI as possible cause of fluid overload.  Pt denies eating high sodium containing foods, has not increased her po fluids and still taking lasix.    CBC, CMP, UA, BNP, EKG and CXR pending.  9:39 PM Pt with leukocytosis of 15,000, CMP with unchanged Cr.  BNP elevated.  Will treat for LLE cellulitis with vanc as pt on doxy and currently appears to be worsening.  Also given IV lasix for fluid overload.  Will admit  For further care.  Gwyneth SproutWhitney Arthella Headings, MD 10/13/13 2141

## 2013-10-14 ENCOUNTER — Encounter (HOSPITAL_BASED_OUTPATIENT_CLINIC_OR_DEPARTMENT_OTHER): Payer: Managed Care, Other (non HMO) | Attending: General Surgery

## 2013-10-14 DIAGNOSIS — E1169 Type 2 diabetes mellitus with other specified complication: Secondary | ICD-10-CM | POA: Insufficient documentation

## 2013-10-14 DIAGNOSIS — L97409 Non-pressure chronic ulcer of unspecified heel and midfoot with unspecified severity: Secondary | ICD-10-CM | POA: Insufficient documentation

## 2013-10-14 DIAGNOSIS — E119 Type 2 diabetes mellitus without complications: Secondary | ICD-10-CM

## 2013-10-14 LAB — GLUCOSE, CAPILLARY
GLUCOSE-CAPILLARY: 115 mg/dL — AB (ref 70–99)
GLUCOSE-CAPILLARY: 121 mg/dL — AB (ref 70–99)
GLUCOSE-CAPILLARY: 104 mg/dL — AB (ref 70–99)
GLUCOSE-CAPILLARY: 117 mg/dL — AB (ref 70–99)
Glucose-Capillary: 115 mg/dL — ABNORMAL HIGH (ref 70–99)
Glucose-Capillary: 63 mg/dL — ABNORMAL LOW (ref 70–99)

## 2013-10-14 LAB — HEMOGLOBIN A1C
Hgb A1c MFr Bld: 7.9 % — ABNORMAL HIGH (ref <5.7)
Mean Plasma Glucose: 180 mg/dL — ABNORMAL HIGH (ref <117)

## 2013-10-14 LAB — BASIC METABOLIC PANEL
BUN: 31 mg/dL — ABNORMAL HIGH (ref 6–23)
CHLORIDE: 100 meq/L (ref 96–112)
CO2: 28 mEq/L (ref 19–32)
CREATININE: 1.81 mg/dL — AB (ref 0.50–1.10)
Calcium: 8.8 mg/dL (ref 8.4–10.5)
GFR calc Af Amer: 34 mL/min — ABNORMAL LOW (ref >90)
GFR calc non Af Amer: 30 mL/min — ABNORMAL LOW (ref >90)
Glucose, Bld: 75 mg/dL (ref 70–99)
Potassium: 4.4 mEq/L (ref 3.7–5.3)
SODIUM: 141 meq/L (ref 137–147)

## 2013-10-14 LAB — CBC
HCT: 30.9 % — ABNORMAL LOW (ref 36.0–46.0)
Hemoglobin: 9.9 g/dL — ABNORMAL LOW (ref 12.0–15.0)
MCH: 28.7 pg (ref 26.0–34.0)
MCHC: 32 g/dL (ref 30.0–36.0)
MCV: 89.6 fL (ref 78.0–100.0)
PLATELETS: 217 10*3/uL (ref 150–400)
RBC: 3.45 MIL/uL — AB (ref 3.87–5.11)
RDW: 16.7 % — AB (ref 11.5–15.5)
WBC: 12 10*3/uL — AB (ref 4.0–10.5)

## 2013-10-14 MED ORDER — INSULIN GLARGINE 100 UNIT/ML ~~LOC~~ SOLN
55.0000 [IU] | Freq: Two times a day (BID) | SUBCUTANEOUS | Status: DC
  Administered 2013-10-14 – 2013-10-16 (×4): 55 [IU] via SUBCUTANEOUS
  Filled 2013-10-14 (×5): qty 0.55

## 2013-10-14 MED ORDER — FUROSEMIDE 40 MG PO TABS
40.0000 mg | ORAL_TABLET | ORAL | Status: DC
  Filled 2013-10-14: qty 1

## 2013-10-14 MED ORDER — INSULIN ASPART 100 UNIT/ML ~~LOC~~ SOLN
0.0000 [IU] | Freq: Three times a day (TID) | SUBCUTANEOUS | Status: DC
Start: 2013-10-14 — End: 2013-10-16
  Administered 2013-10-16: 3 [IU] via SUBCUTANEOUS

## 2013-10-14 NOTE — Progress Notes (Signed)
Inpatient Diabetes Program Recommendations  AACE/ADA: New Consensus Statement on Inpatient Glycemic Control (2013)  Target Ranges:  Prepandial:   less than 140 mg/dL      Peak postprandial:   less than 180 mg/dL (1-2 hours)      Critically ill patients:  140 - 180 mg/dL   Hypoglycemia May want to lower basal lantus dose and use sensitive correction The novolog order needs to be specified with glucose ranges. Will call pharmacy. Thank you, Lenor CoffinAnn Rabia Argote, RN, CNS, Diabetes Coordinator [phone])

## 2013-10-14 NOTE — Progress Notes (Signed)
Pt states she feels like her blood sugar is low. Checked- CBG 63. Gave pt juice and packet of graham crackers. Will re-check and continue to monitor.

## 2013-10-14 NOTE — Progress Notes (Signed)
[facility] PROGRESS NOTE  Kimberly [name]:[id number] DOB: 04-28-1956 DOA: 10/13/2013 PCP: [name], MD  Assessment/Plan: #1 left lower extremity cellulitis Significantly erythematous, decreased edema. Continue empiric IV vancomycin. Follow.  #2 hypertension Continue clonidine, Lasix, Lopressor.  #3 diabetes mellitus Patient with CBG of 63. Decrease Lantus to 55 units twice daily. Place on resistant sliding scale insulin.  #4 chronic kidney disease stage III Stable. Follow.  #5 COPD Stable.nebs as needed.  #6 right lower extremity wound Consult with the wound care team.  #7 history of CHF Stable. Patient currently on IV Lasix. Will transitioned to oral Lasix in the morning. Follow.  #8 coronary artery disease/peripheral vascular disease Stable. Patient denies any chest pain. Continue Lopressor, Lasix.  #9 prophylaxis Lovenox for DVT prophylaxis.  Code Status: full Family Communication: updated patient at bedside. Disposition Plan: home when medically stable.   Consultants:  none  Procedures:  none  Antibiotics:  IV vancomycin 10/14/2013  HPI/Subjective: Patient states swelling in lower extremities has improved since admission.  Objective: Filed Vitals:   10/14/13 0624  BP: 145/78  Pulse: 84  Temp: 97.8 F (36.6 C)  Resp: 18    Intake/Output Summary (Last 24 hours) at 10/14/13 1129 Last data filed at 10/14/13 0600  Gross per 24 hour  Intake    200 ml  Output   2650 ml  Net  -2450 ml   Filed Weights   10/13/13 1942  Weight: 124.739 kg (275 lb)    Exam:   General:  NAD  Cardiovascular: RRR  Respiratory: CTAB  Abdomen: Soft/NT/ND/+BS  Musculoskeletal: No c/. 1 + LLE edema, left lower extremity with erythema. right lower extremity bandaged.  Data Reviewed: Basic Metabolic Panel:  Recent Labs Lab 10/13/13 1916 10/14/13 0655  NA 138 141  K 4.7 4.4  CL 97 100  CO2 27 28  GLUCOSE 74 75  BUN 32* 31*   CREATININE 1.77* 1.81*  CALCIUM 9.0 8.8   Liver Function Tests:  Recent Labs Lab 10/13/13 1916  AST 16  ALT 13  ALKPHOS 139*  BILITOT 0.4  PROT 8.8*  ALBUMIN 3.1*   No results found for this basename: LIPASE, AMYLASE,  in the last 168 hours No results found for this basename: AMMONIA,  in the last 168 hours CBC:  Recent Labs Lab 10/13/13 1916 10/14/13 0655  WBC 15.3* 12.0*  NEUTROABS 12.0*  --   HGB 11.1* 9.9*  HCT 33.4* 30.9*  MCV 89.5 89.6  PLT 251 217   Cardiac Enzymes: No results found for this basename: CKTOTAL, CKMB, CKMBINDEX, TROPONINI,  in the last 168 hours BNP (last 3 results)  Recent Labs  09/10/13 1441 09/10/13 1827 10/13/13 1916  PROBNP 5021.0* 4050.0* 1519.0*   CBG:  Recent Labs Lab 10/13/13 2109 10/13/13 2217 10/14/13 0436 10/14/13 0713 10/14/13 0804  GLUCAP 57* 106* 115* 63* 115*    No results found for this or any previous visit (from the past 240 hour(s)).   Studies: Dg Chest 2 View  10/13/2013   CLINICAL DATA:  Fluid overload.  EXAM: CHEST  2 VIEW  COMPARISON:  PA and lateral chest 09/25/2013.  FINDINGS: Cardiomegaly is again seen. There is pulmonary vascular congestion without frank edema. No consolidative process, pneumothorax or pleural effusion.  IMPRESSION: Cardiomegaly and pulmonary vascular congestion without frank edema. No focal process.   Electronically Signed   By: [name] M.D.   On: 10/13/2013 22:10    Scheduled Meds: . aspirin EC  81 mg Oral Daily  .  cloNIDine  0.1 mg Oral BID  . enoxaparin (LOVENOX) injection  60 mg Subcutaneous Q24H  . furosemide  40 mg Intravenous Daily  . insulin aspart  20-60 Units Subcutaneous TID AC  . insulin glargine  61 Units Subcutaneous BID  . metoprolol tartrate  25 mg Oral BID  . pregabalin  75 mg Oral QPM  . sodium chloride  3 mL Intravenous Q12H  . vancomycin  1,250 mg Intravenous Q24H   Continuous Infusions:   Principal Problem:   Cellulitis Active Problems:   DM    HYPERLIPIDEMIA   HYPERTENSION   CAD, NATIVE VESSEL   PVD   COPD   Edema   Diastolic CHF   CKD (chronic kidney disease), stage III   Chronic respiratory failure with hypoxia   On home oxygen therapy    Time spent: 35 mins    [facility] Valley Specialty HospitalHOMPSON,Gerardine Peltz MD [facility] Pager [phone]. If 7PM-7AM, please contact night-coverage at [email or web address], password [email or web address] 10/14/2013, 11:29 AM  LOS: 1 day

## 2013-10-15 ENCOUNTER — Encounter (HOSPITAL_COMMUNITY): Payer: Self-pay | Admitting: General Practice

## 2013-10-15 DIAGNOSIS — L0390 Cellulitis, unspecified: Secondary | ICD-10-CM

## 2013-10-15 HISTORY — DX: Cellulitis, unspecified: L03.90

## 2013-10-15 LAB — GLUCOSE, CAPILLARY
GLUCOSE-CAPILLARY: 107 mg/dL — AB (ref 70–99)
GLUCOSE-CAPILLARY: 109 mg/dL — AB (ref 70–99)
GLUCOSE-CAPILLARY: 250 mg/dL — AB (ref 70–99)
Glucose-Capillary: 88 mg/dL (ref 70–99)
Glucose-Capillary: 158 mg/dL — ABNORMAL HIGH (ref 70–99)
Glucose-Capillary: 244 mg/dL — ABNORMAL HIGH (ref 70–99)

## 2013-10-15 LAB — CBC
HCT: 30.6 % — ABNORMAL LOW (ref 36.0–46.0)
Hemoglobin: 10 g/dL — ABNORMAL LOW (ref 12.0–15.0)
MCH: 29.4 pg (ref 26.0–34.0)
MCHC: 32.7 g/dL (ref 30.0–36.0)
MCV: 90 fL (ref 78.0–100.0)
PLATELETS: 197 10*3/uL (ref 150–400)
RBC: 3.4 MIL/uL — AB (ref 3.87–5.11)
RDW: 16.5 % — ABNORMAL HIGH (ref 11.5–15.5)
WBC: 9.3 10*3/uL (ref 4.0–10.5)

## 2013-10-15 LAB — BASIC METABOLIC PANEL
BUN: 34 mg/dL — ABNORMAL HIGH (ref 6–23)
CO2: 29 mEq/L (ref 19–32)
Calcium: 8.7 mg/dL (ref 8.4–10.5)
Chloride: 97 mEq/L (ref 96–112)
Creatinine, Ser: 1.89 mg/dL — ABNORMAL HIGH (ref 0.50–1.10)
GFR, EST AFRICAN AMERICAN: 33 mL/min — AB (ref >90)
GFR, EST NON AFRICAN AMERICAN: 28 mL/min — AB (ref >90)
Glucose, Bld: 80 mg/dL (ref 70–99)
POTASSIUM: 4.5 meq/L (ref 3.7–5.3)
SODIUM: 138 meq/L (ref 137–147)

## 2013-10-15 MED ORDER — FUROSEMIDE 40 MG PO TABS
40.0000 mg | ORAL_TABLET | Freq: Every day | ORAL | Status: DC
  Administered 2013-10-15 – 2013-10-16 (×2): 40 mg via ORAL
  Filled 2013-10-15 (×2): qty 1

## 2013-10-15 MED ORDER — COLLAGENASE 250 UNIT/GM EX OINT
TOPICAL_OINTMENT | Freq: Every day | CUTANEOUS | Status: DC
  Administered 2013-10-15: 12:00:00 via TOPICAL
  Filled 2013-10-15: qty 30

## 2013-10-15 MED ORDER — POLYETHYLENE GLYCOL 3350 17 G PO PACK
17.0000 g | PACK | Freq: Every day | ORAL | Status: DC
  Administered 2013-10-15: 17 g via ORAL
  Filled 2013-10-15 (×3): qty 1

## 2013-10-15 MED ORDER — DOCUSATE SODIUM 100 MG PO CAPS
200.0000 mg | ORAL_CAPSULE | Freq: Two times a day (BID) | ORAL | Status: DC
  Administered 2013-10-15: 200 mg via ORAL
  Filled 2013-10-15 (×3): qty 2

## 2013-10-15 NOTE — Progress Notes (Signed)
[facility] PROGRESS NOTE  Kimberly [name] ZOX:[id number] DOB: 01/22/1956 DOA: 10/13/2013 PCP: Kimberly Otto, MD  Assessment/Plan: 58 yo female with chronic diastolic chf, dm, hld, crf on 2 L o2 cont at home, copd comes in with worsening swelling ble esp left more than right (right has been wrapped and being treated for chronic wound which is improving) but left is more swollen than usual and more red  #1 left lower extremity cellulitis  -Significantly erythematous, decreased edema. Continue empiric IV vancomycin. appreciate wound care #2 hypertension  -Continue clonidine, Lasix, Lopressor.  #3 diabetes mellitus HA1-7.9 -Patient with CBG of 63. Decrease Lantus to 55 units twice daily. Place on resistant sliding scale insulin.  #4 chronic kidney disease stage III  -Stable. Follow.  #5 COPD  -Stable.nebs as needed.  #6 right lower extremity wound  -Consult with the wound care team.  #7 history of CHF  -was on IV Lasix, I/O neg 4.7; transitioned to oral Lasix Follow.  #8 coronary artery disease/peripheral vascular disease  -Stable. Patient denies any chest pain. Continue Lopressor, Lasix.   #9 prophylaxis  Lovenox for DVT prophylaxis.   Code Status: full Family Communication: d/w patient (indicate person spoken with, relationship, and if by phone, the number) Disposition Plan: home 24-48 hours    Consultants:  wound care   Procedures:  None   Antibiotics:  vanc IV 2/3<<, (indicate start date, and stop date if known)   HPI/Subjective: alert  Objective: Filed Vitals:   10/15/13 0345  BP: 144/55  Pulse: 71  Temp: 97.5 F (36.4 C)  Resp: 20    Intake/Output Summary (Last 24 hours) at 10/15/13 0919 Last data filed at 10/15/13 0657  Gross per 24 hour  Intake   1410 ml  Output   3550 ml  Net  -2140 ml   Filed Weights   10/13/13 1942  Weight: 124.739 kg (275 lb)    Exam:   General:  alert  Cardiovascular: s1,s2 rrr  Respiratory: few crackles  in LL  Abdomen: soft, nt, nd   Musculoskeletal: LE edema, woudn dressesd   Data Reviewed: Basic Metabolic Panel:  Recent Labs Lab 10/13/13 1916 10/14/13 0655 10/15/13 0654  NA 138 141 138  K 4.7 4.4 4.5  CL 97 100 97  CO2 27 28 29   GLUCOSE 74 75 80  BUN 32* 31* 34*  CREATININE 1.77* 1.81* 1.89*  CALCIUM 9.0 8.8 8.7   Liver Function Tests:  Recent Labs Lab 10/13/13 1916  AST 16  ALT 13  ALKPHOS 139*  BILITOT 0.4  PROT 8.8*  ALBUMIN 3.1*   No results found for this basename: LIPASE, AMYLASE,  in the last 168 hours No results found for this basename: AMMONIA,  in the last 168 hours CBC:  Recent Labs Lab 10/13/13 1916 10/14/13 0655 10/15/13 0654  WBC 15.3* 12.0* 9.3  NEUTROABS 12.0*  --   --   HGB 11.1* 9.9* 10.0*  HCT 33.4* 30.9* 30.6*  MCV 89.5 89.6 90.0  PLT 251 217 197   Cardiac Enzymes: No results found for this basename: CKTOTAL, CKMB, CKMBINDEX, TROPONINI,  in the last 168 hours BNP (last 3 results)  Recent Labs  09/10/13 1441 09/10/13 1827 10/13/13 1916  PROBNP 5021.0* 4050.0* 1519.0*   CBG:  Recent Labs Lab 10/14/13 1137 10/14/13 1622 10/14/13 2157 10/15/13 0343 10/15/13 0642  GLUCAP 121* 104* 117* 107* 88    No results found for this or any previous visit (from the past 240 hour(s)).   Studies:  Dg Chest 2 View  10/13/2013   CLINICAL DATA:  Fluid overload.  EXAM: CHEST  2 VIEW  COMPARISON:  PA and lateral chest 09/25/2013.  FINDINGS: Cardiomegaly is again seen. There is pulmonary vascular congestion without frank edema. No consolidative process, pneumothorax or pleural effusion.  IMPRESSION: Cardiomegaly and pulmonary vascular congestion without frank edema. No focal process.   Electronically Signed   By: Drusilla Kannerhomas  Dalessio M.D.   On: 10/13/2013 22:10    Scheduled Meds: . aspirin EC  81 mg Oral Daily  . cloNIDine  0.1 mg Oral BID  . collagenase   Topical Daily  . enoxaparin (LOVENOX) injection  60 mg Subcutaneous Q24H  .  furosemide  40 mg Oral QODAY  . insulin aspart  0-20 Units Subcutaneous TID WC  . insulin glargine  55 Units Subcutaneous BID  . metoprolol tartrate  25 mg Oral BID  . pregabalin  75 mg Oral QPM  . sodium chloride  3 mL Intravenous Q12H  . vancomycin  1,250 mg Intravenous Q24H   Continuous Infusions:   Principal Problem:   Cellulitis Active Problems:   DM   HYPERLIPIDEMIA   HYPERTENSION   CAD, NATIVE VESSEL   PVD   COPD   Edema   Diastolic CHF   CKD (chronic kidney disease), stage III   Chronic respiratory failure with hypoxia   On home oxygen therapy    Time spent: >35 minutes     Kimberly [name], Kimberly [name]  [facility] Pager [phone]. If 7PM-7AM, please contact night-coverage at [email or web address], password [email or web address] 10/15/2013, 9:19 AM  LOS: 2 days

## 2013-10-15 NOTE — Consult Note (Signed)
WOC wound consult note Reason for Consult: right heel ulceration  Pt reports initially calloused area over her right heel secondary to known neuropathy, eventually opened [facility]. She was referred to the wound care center and has been followed by Dr. Wiliam KeArkin there for 3 months.  He provides serial debridement and she has a PRFO she wears while in bedShe has not been able to tolerate compression wraps due to her neuropathy.  Her ABI last documented are R 0.67 and L 0.79.  Her xray is negative for osteomyelitis.  She reports RLS bilaterally.  She has erythema over the bilateral LE with the L>R but based on the patients report and the marked areas at the time of admission this is improving with IV abtx. Bilaterally I am able to palpate her DP and PT pulses. She has bilateral LE edema with the R>L, certainly related to have some mild compression on the RLE.   She is being evaluated by the wound care center for fitting for Juxta wraps to see if she can tolerate these for long term compression needs.  Wound type: neuropathic heel ulcer R Measurement:2.5c x 2.0cm x 0.2cm  Wound bed: 90% clean, early granulation tissue, with 10% centrally located yellow slough Drainage (amount, consistency, odor) minimal Periwound:intact Dressing procedure/placement/frequency: Silver gel to the slough today, moist 2x2 and foam heel cup, wrapped her in kerlix and coban with no stretch per her request. [facility].   Pt to keep her follow up with Dr. Wiliam KeArkin at the time of discharge and resume [facility]   WOC will follow along with you for compression wrap and wound care  Meir [facility] RN,CWOCN [phone]

## 2013-10-15 NOTE — Progress Notes (Signed)
OT NOTE   Pt seen for eval 2/3. Full note to follow. Pt will be able to D/C home with home health services. [facility] Hospitalil[facility] Jaesean Litzau, OTR/L  [phone] 10/14/2013

## 2013-10-15 NOTE — Progress Notes (Addendum)
OT EVALUATION  Late entry for 10/14/13  10/14/13 1700  OT Visit Information  Last OT Received On 10/15/13  Assistance Needed +1  History of Present Illness 58 year old female who has a past medical history of HYPERLIPIDEMIA; HYPERTENSION; CAD, NATIVE VESSEL; PVD; DM; COPD; Edema; H/O carotid endarterectomy; and Stroke. Admitted for shortness of breath, community acquired pneumonia  Precautions  Precautions Fall  Precaution Comments prafo for bed. "cast shoe for ambulation  Required Braces or Orthoses Other Brace/Splint  Home Living  Family/patient expects to be discharged to: Private residence  Living Arrangements Spouse/significant other  Available Help at Discharge Family;Available PRN/intermittently  Type of Home House  Home Access Ramped entrance  Home Layout One level  Bathroom Shower/Tub Tub/shower unit  Bathroom Toilet Handicapped height  Bathroom Accessibility Yes  How Accessible Accessible via walker  Home Equipment Walker - 4 wheels;Walker - 2 wheels;Hospital bed;BSC  Prior Function  Level of Independence Needs assistance  Gait / Transfers Assistance Needed mod i  ADL's / Homemaking Assistance Needed husband assisted with bathing/dressing  Communication  Communication No difficulties  Cognition  Arousal/Alertness Awake/alert  Behavior During Therapy WFL for tasks assessed/performed  Overall Cognitive Status Within Functional Limits for tasks assessed  Upper Extremity Assessment  Upper Extremity Assessment Overall WFL for tasks assessed  Lower Extremity Assessment  Lower Extremity Assessment Defer to PT evaluation  Cervical / Trunk Assessment  Cervical / Trunk Assessment Normal  ADL  Grooming Set up  Where Assessed - Grooming Unsupported sitting  Upper Body Bathing Set up  Where Assessed - Upper Body Bathing Unsupported sitting  Lower Body Bathing Moderate assistance  Where Assessed - Lower Body Bathing Supported sit to stand  Upper Body Dressing Set up  Where  Assessed - Upper Body Dressing Unsupported sitting  Lower Body Dressing Moderate assistance  Where Assessed - Lower Body Dressing Supported sit to Dispensing opticianstand  Toilet Transfer Min guard  Toilet Transfer Method Stand pivot  Toilet Transfer Equipment Bedside commode  Toileting - Clothing Manipulation and Hygiene Moderate assistance  Where Assessed - Engineer, miningToileting Clothing Manipulation and Hygiene Sit to stand from 3-in-1 or toilet  Equipment Used Gait belt;Rolling walker  Transfers/Ambulation Related to ADLs min A  ADL Comments would benefit from info on tub bench and AE for hygiene after toileting  Vision - History  Baseline Vision Wears glasses all the time  Bed Mobility  Overal bed mobility Modified Independent (increased HOB)  Transfers  Overall transfer level Needs assistance  Equipment used Rolling walker (2 wheeled)  Transfers Sit to/from BJ'sStand;Stand Pivot Transfers  Sit to Stand Min assist  Balance  Overall balance assessment No apparent balance deficits (not formally assessed)  OT - End of Session  Equipment Utilized During Treatment Gait belt;Rolling walker  Activity Tolerance Patient tolerated treatment well  Patient left in chair;with call bell/phone within reach  Nurse Communication Mobility status;Precautions  OT Assessment  OT Recommendation/Assessment Patient needs continued OT Services  OT Problem List Decreased strength;Decreased activity tolerance;Decreased knowledge of use of DME or AE;Obesity  OT Therapy Diagnosis  Generalized weakness  OT Plan  OT Frequency Min 2X/week  OT Treatment/Interventions Self-care/ADL training;Therapeutic exercise;Energy conservation;DME and/or AE instruction;Patient/family education;Therapeutic activities  OT Recommendation  Follow Up Recommendations Supervision - Intermittent;Home health OT  OT Equipment Tub/shower bench ([employer])  Individuals Consulted  Consulted and Agree with Results and Recommendations Patient  Acute Rehab OT Goals   Patient Stated Goal to go home  OT Goal Formulation With patient  Time For Goal Achievement 10/29/13  Potential to Achieve Goals Good  OT Time Calculation  OT Start Time 1550  OT Stop Time 1615  OT Time Calculation (min) 25 min  [facility], OTR/L  [phone] 10/14/2013

## 2013-10-15 NOTE — Evaluation (Signed)
Physical Therapy Evaluation Patient Details Name: Kimberly [name] MRN: [id number] DOB: 07/15/1956 Today's Date: 10/15/2013 Time: 7829-5621 PT Time Calculation (min): 42 min  PT Assessment / Plan / Recommendation History of Present Illness  58 yo female with chronic diastolic chf, dm, hld, crf on 2 L o2 cont at home, copd comes in with worsening swelling ble esp left more than right (right has been wrapped and being treated for chronic wound which is improving) but left is more swollen than usual and more red  Clinical Impression  Pt had been receiving HHPT PTA, recommend continuance after d/c. Pt will benefit from acute PT to increase strength and balance with out of bed mobility.     PT Assessment  Patient needs continued PT services    Follow Up Recommendations  Home health PT;Supervision - Intermittent    Does the patient have the potential to tolerate intense rehabilitation      Barriers to Discharge        Equipment Recommendations  None recommended by PT    Recommendations for Other Services     Frequency Min 3X/week    Precautions / Restrictions Precautions Precautions: Fall Precaution Comments: prafo for bed. Hard bottom shoe for ambulation Required Braces or Orthoses: Other Brace/Splint Other Brace/Splint: PRAFO right LE Restrictions Weight Bearing Restrictions: No   Pertinent Vitals/Pain VSS      Mobility  Bed Mobility Overal bed mobility: Modified Independent (with rail and HOB elevated (has hospital bed at home)) General bed mobility comments: uses momentum and rail to get self to EOB Transfers Overall transfer level: Needs assistance Equipment used: Rolling walker (2 wheeled) Transfers: Sit to/from [employer] Sit to Stand: Min assist Stand pivot transfers: Min assist General transfer comment: pt uses momentum to stand, min A to wt-shift fwd, pt unsteady with initial standing and sometimes feels that knees will  buckle Ambulation/Gait Ambulation/Gait assistance: Min assist Ambulation Distance (Feet): 14 Feet (7', 7') Assistive device: Rolling walker (2 wheeled) Gait Pattern/deviations: Step-through pattern;Wide base of support;Trunk flexed Gait velocity: decreased Gait velocity interpretation: <1.8 ft/sec, indicative of risk for recurrent falls General Gait Details: pt with wide BOS and reliant on RW    Exercises General Exercises - Lower Extremity Ankle Circles/Pumps: AROM;Both;10 reps;Supine Quad Sets: AROM;Both;10 reps;Supine Gluteal Sets: AROM;Both;15 reps;Supine Short Arc Quad: AROM;5 reps;15 reps;Supine Long Arc Quad: AROM;Both;10 reps;Seated Hip ABduction/ADduction: AROM;Both;10 reps;Sidelying Straight Leg Raises: AROM;Both;10 reps;Supine Hip Flexion/Marching: AROM;Both;10 reps;Seated Toe Raises: AROM;10 reps;Both;Seated Heel Raises: AROM;10 reps;Both;Seated   PT Diagnosis: Abnormality of gait;Generalized weakness;Acute pain;Difficulty walking  PT Problem List: Decreased strength;Decreased range of motion;Decreased activity tolerance;Decreased balance;Decreased mobility;Obesity;Pain PT Treatment Interventions: DME instruction;Gait training;Functional mobility training;Therapeutic activities;Therapeutic exercise;Balance training;Patient/family education     PT Goals(Current goals can be found in the care plan section) Acute Rehab PT Goals Patient Stated Goal: to go home PT Goal Formulation: With patient Time For Goal Achievement: 10/29/13 Potential to Achieve Goals: Good  Visit Information  Last PT Received On: 10/15/13 Assistance Needed: +1 History of Present Illness: 58 yo female with chronic diastolic chf, dm, hld, crf on 2 L o2 cont at home, copd comes in with worsening swelling ble esp left more than right (right has been wrapped and being treated for chronic wound which is improving) but left is more swollen than usual and more red       Prior Functioning  Home  Living Family/patient expects to be discharged to:: Private residence Living Arrangements: Spouse/significant other Available Help at Discharge: Family;Available PRN/intermittently Type  of Home: House Home Access: Ramped entrance Home Layout: One level Home Equipment: Walker - 4 wheels;Walker - 2 wheels;Hospital bed;Bedside commode Additional Comments: pt has been receiving HHPT, has only been able to transfer past 3wks, before that was ambulating about 10' at a time Prior Function Level of Independence: Needs assistance Gait / Transfers Assistance Needed: mod I for transfers, was not ambulating PTA ADL's / Homemaking Assistance Needed: husband assisted with bathing/dressing Comments: uses RW at home, sleeps in hospital bed, uses BSC to conserve energy at home Communication Communication: No difficulties    Cognition  Cognition Arousal/Alertness: Awake/alert Behavior During Therapy: WFL for tasks assessed/performed Overall Cognitive Status: Within Functional Limits for tasks assessed    Extremity/Trunk Assessment Upper Extremity Assessment Upper Extremity Assessment: Defer to OT evaluation Lower Extremity Assessment Lower Extremity Assessment: Generalized weakness;RLE deficits/detail;LLE deficits/detail RLE Deficits / Details: hip flex 2-/5, hip abd/ add 3/5, knee flex/ ext 3/5, ankle df 3+/5  LLE Deficits / Details: hip flex 2/5, hip abd/ add 3/5, knee flex/ ext 3/5, ankle df 3+/5 Cervical / Trunk Assessment Cervical / Trunk Assessment: Normal   Balance Balance Overall balance assessment: Needs assistance Sitting-balance support: Feet supported;No upper extremity supported Sitting balance-Leahy Scale: Normal Standing balance support: During functional activity;Bilateral upper extremity supported Standing balance-Leahy Scale: Poor Standing balance comment: requires UE support to maintain balance  End of Session PT - End of Session Equipment Utilized During Treatment: Gait  belt;Oxygen Activity Tolerance: Patient tolerated treatment well Patient left: in chair;with call bell/phone within reach Nurse Communication: Mobility status  GP   Lyanne CoVictoria Bemnet [name], PT  Acute Rehab Services  [phone]   Lyanne CoManess, Matthe Sloane 10/15/2013, 4:07 PM

## 2013-10-16 DIAGNOSIS — I5033 Acute on chronic diastolic (congestive) heart failure: Secondary | ICD-10-CM

## 2013-10-16 DIAGNOSIS — J189 Pneumonia, unspecified organism: Secondary | ICD-10-CM

## 2013-10-16 LAB — GLUCOSE, CAPILLARY
GLUCOSE-CAPILLARY: 182 mg/dL — AB (ref 70–99)
Glucose-Capillary: 141 mg/dL — ABNORMAL HIGH (ref 70–99)
Glucose-Capillary: 96 mg/dL (ref 70–99)

## 2013-10-16 MED ORDER — INSULIN GLARGINE 100 UNIT/ML ~~LOC~~ SOLN: 55.0000 [IU] | Freq: Two times a day (BID) | SUBCUTANEOUS | Status: DC

## 2013-10-16 MED ORDER — COLLAGENASE 250 UNIT/GM EX OINT: TOPICAL_OINTMENT | Freq: Every day | CUTANEOUS | Status: DC

## 2013-10-16 MED ORDER — LEVOFLOXACIN 250 MG PO TABS: 250.0000 mg | ORAL_TABLET | Freq: Every day | ORAL | Status: DC

## 2013-10-16 NOTE — Discharge Summary (Signed)
Physician Discharge Summary  Kimberly ManilaSusan K Myron WUJ:[id number] DOB: Oct 01, 1955 DOA: 10/13/2013  PCP: Ginette OttoSTONEKING,[name] THOMAS, MD  Admit date: 10/13/2013 Discharge date: 10/16/2013  Time spent: >35 minutes  Recommendations for Outpatient Follow-up:  F/u with woudn care as scheduled F/u with PCP in 1 week Discharge Diagnoses:  Principal Problem:   Cellulitis Active Problems:   DM   HYPERLIPIDEMIA   HYPERTENSION   CAD, NATIVE VESSEL   PVD   COPD   Edema   Diastolic CHF   CKD (chronic kidney disease), stage III   Chronic respiratory failure with hypoxia   On home oxygen therapy   Discharge Condition: stable   Diet recommendation: DM  Filed Weights   10/13/13 1942 10/16/13 0500  Weight: 124.739 kg (275 lb) 124.3 kg (274 lb 0.5 oz)    History of present illness:  58 yo female with chronic diastolic chf, dm, hld, crf on 2 L o2 cont at home, copd comes in with worsening swelling ble esp left more than right (right has been wrapped and being treated for chronic wound which is improving) but left is more swollen than usual and more red   Hospital Course:  #1 left lower extremity cellulitis  -improved on empiric IV vancomycin, wound care; recommended to cont outpatient wound care every week as scheduled; PO levofloxacin 5 days  #2 hypertension  -stable on clonidine, Lasix, Lopressor.  #3 diabetes mellitus HA1-7.9  -Patient with CBG of 63. Decrease Lantus to 55 units twice daily. Stable  #4 chronic kidney disease stage III  -Stable. Follow.  #5 COPD  -Stable.nebs as needed.  #7 history of CHF  -diuresed well on IV Lasix, I/O neg 4.7; transitioned to oral Lasix ; outpatient follow up   #8 coronary artery disease/peripheral vascular disease  -Stable. Patient denies any chest pain. Continue Lopressor, Lasix.    Procedures:  woudn care (i.e. Studies not automatically included, echos, thoracentesis, etc; not x-rays)  Consultations:  Wound care  Discharge Exam: Filed Vitals:    10/16/13 0401  BP: 156/57  Pulse: 70  Temp: 98.3 F (36.8 C)  Resp: 18    General: alert Cardiovascular: s1,s2 rrr Respiratory: CTA BL  Discharge Instructions  Discharge Orders   Future Appointments Provider Department Dept Phone   10/17/2013 8:15 AM Lewayne BuntingBrian S Crenshaw, MD [facility] [phone]   10/28/2013 1:00 PM Pryor OchoaJames D Lawson, MD [facility] [phone]   11/11/2013 7:45 AM Runell GessJonathan J Berry, MD [facility] [phone]   Future Orders Complete By Expires   Diet - low sodium heart healthy  As directed    Discharge instructions  As directed    Comments:     Please follow up with primary care doctor in 1 week   Increase activity slowly  As directed        Medication List         albuterol 108 (90 BASE) MCG/ACT inhaler  Commonly known as:  PROVENTIL HFA;VENTOLIN HFA  Inhale 1-2 puffs into the lungs every 6 (six) hours as needed for wheezing or shortness of breath.     albuterol (2.5 MG/3ML) 0.083% nebulizer solution  Commonly known as:  PROVENTIL  Take 2.5 mg by nebulization every 6 (six) hours as needed for wheezing or shortness of breath.     aspirin EC 81 MG tablet  Take 81 mg by mouth daily.     cloNIDine 0.1 MG tablet  Commonly known as:  CATAPRES  Take 0.1 mg by mouth  2 (two) times daily.     collagenase ointment  Commonly known as:  SANTYL  Apply topically daily.     doxycycline 100 MG capsule  Commonly known as:  VIBRAMYCIN  Take 100 mg by mouth 2 (two) times daily.     furosemide 40 MG tablet  Commonly known as:  LASIX  Take 40 mg by mouth every other day.     insulin aspart 100 UNIT/ML injection  Commonly known as:  novoLOG  Inject 20-60 Units into the skin 3 (three) times daily before meals. DEPENDS ON WHAT SHE IS GOING TO EAT     insulin glargine 100 UNIT/ML injection  Commonly known as:  LANTUS  Inject 0.55 mLs (55 Units total) into the skin 2 (two) times daily.     levofloxacin  250 MG tablet  Commonly known as:  LEVAQUIN  Take 1 tablet (250 mg total) by mouth daily.     metoprolol tartrate 25 MG tablet  Commonly known as:  LOPRESSOR  Take 25 mg by mouth 2 (two) times daily.     MULTIVITAMIN PO  Take 1 tablet by mouth daily.     pregabalin 75 MG capsule  Commonly known as:  LYRICA  Take 75 mg by mouth every evening.     traMADol 50 MG tablet  Commonly known as:  ULTRAM  Take 50 mg by mouth every 6 (six) hours as needed for moderate pain.       Allergies  Allergen Reactions  . Epinephrine     Increased heart rate       Follow-up Information   Follow up with [name], MD In 1 week.   Specialty:  Internal Medicine   Contact information:   [address] [place] [address] [phone]        The results of significant diagnostics from this hospitalization (including imaging, microbiology, ancillary and laboratory) are listed below for reference.    Significant Diagnostic Studies: Dg Chest 2 View  10/13/2013   CLINICAL DATA:  Fluid overload.  EXAM: CHEST  2 VIEW  COMPARISON:  PA and lateral chest 09/25/2013.  FINDINGS: Cardiomegaly is again seen. There is pulmonary vascular congestion without frank edema. No consolidative process, pneumothorax or pleural effusion.  IMPRESSION: Cardiomegaly and pulmonary vascular congestion without frank edema. No focal process.   Electronically Signed   By: [name] M.D.   On: 10/13/2013 22:10   Dg Chest 2 View  09/25/2013   CLINICAL DATA:  Follow-up pneumonia.  Cough.  EXAM: CHEST  2 VIEW  COMPARISON:  09/10/2013  FINDINGS: The cardiac silhouette remains upper limits of normal in size. The lungs remain mildly hypoinflated. Previously described increased density in the right middle and lower lobes is not clearly identified on the current study. Interstitial markings are mildly prominent diffusely, unchanged. There is no evidence of focal airspace consolidation, pleural effusion,  or pneumothorax. No acute osseous abnormality is identified.  IMPRESSION: No evidence of focal airspace consolidation. Persistent mildly prominent interstitial markings bilaterally.   Electronically Signed   By: Sebastian Ache   On: 09/25/2013 16:04   Dg Os Calcis Right  09/23/2013   CLINICAL DATA:  History of diabetes, cutaneous wound over the left heel  EXAM: RIGHT OS CALCIS - 2+ VIEW  COMPARISON:  None.  FINDINGS: The calcaneus appears adequately mineralized. There are moderate-sized plantar and Achilles region calcaneal spurs. There is no lytic or blastic lesion or periosteal reaction. The overlying soft tissues exhibit no  abnormal gas collections or calcifications.  IMPRESSION: There is no radiographic evidence of osteomyelitis. No definite findings of cellulitis are demonstrated. There are plantar calcaneal spurs.   Electronically Signed   By: David  [place]   On: 09/23/2013 11:39    Microbiology: No results found for this or any previous visit (from the past 240 hour(s)).   Labs: Basic Metabolic Panel:  Recent Labs Lab 10/13/13 1916 10/14/13 0655 10/15/13 0654  NA 138 141 138  K 4.7 4.4 4.5  CL 97 100 97  CO2 27 28 29   GLUCOSE 74 75 80  BUN 32* 31* 34*  CREATININE 1.77* 1.81* 1.89*  CALCIUM 9.0 8.8 8.7   Liver Function Tests:  Recent Labs Lab 10/13/13 1916  AST 16  ALT 13  ALKPHOS 139*  BILITOT 0.4  PROT 8.8*  ALBUMIN 3.1*   No results found for this basename: LIPASE, AMYLASE,  in the last 168 hours No results found for this basename: AMMONIA,  in the last 168 hours CBC:  Recent Labs Lab 10/13/13 1916 10/14/13 0655 10/15/13 0654  WBC 15.3* 12.0* 9.3  NEUTROABS 12.0*  --   --   HGB 11.1* 9.9* 10.0*  HCT 33.4* 30.9* 30.6*  MCV 89.5 89.6 90.0  PLT 251 217 197   Cardiac Enzymes: No results found for this basename: CKTOTAL, CKMB, CKMBINDEX, TROPONINI,  in the last 168 hours BNP: BNP (last 3 results)  Recent Labs  09/10/13 1441 09/10/13 1827 10/13/13 1916   PROBNP 5021.0* 4050.0* 1519.0*   CBG:  Recent Labs Lab 10/15/13 1642 10/15/13 2151 10/15/13 2153 10/16/13 0355 10/16/13 0627  GLUCAP 158* 244* 250* 182* 141*       Signed:  Jonette Mate N  [facility] 10/16/2013, 10:30 AM

## 2013-10-16 NOTE — Progress Notes (Signed)
Occupational Therapy Treatment Patient Details Name: Kimberly [name] MRN: [id number] DOB: 01-Jan-1956 Today's Date: 10/16/2013 Time: 1002-1018 OT Time Calculation (min): 16 min  OT Assessment / Plan / Recommendation  History of present illness 58 yo female with chronic diastolic chf, dm, hld, crf on 2 L o2 cont at home, copd comes in with worsening swelling ble esp left more than right (right has been wrapped and being treated for chronic wound which is improving) but left is more swollen than usual and more red   OT comments  This pt is making progress and is happy that she is headed home today. She reports that there are no questions/concerns taht she has about BADLs at this point.   Follow Up Recommendations  Supervision - Intermittent;Home health OT       Equipment Recommendations  Tub/shower bench (says has a seat and will look for a tub bench on her own)       Frequency Min 2X/week   Progress towards OT Goals Progress towards OT goals: Progressing toward goals  Plan Discharge plan remains appropriate    Precautions / Restrictions Precautions Precautions: Fall Precaution Comments: prafo for bed. Hard bottom shoe for ambulation Required Braces or Orthoses: Other Brace/Splint Other Brace/Splint: PRAFO right LE, other off loading shoe for ambulation Restrictions Weight Bearing Restrictions: No       ADL  Toilet Transfer: Supervision/safety Toilet Transfer Method: Sit to [place] Toilet Transfer Equipment:  (Bed>around foot and then back around to recliner) Equipment Used: Rolling walker (walking boot) Transfers/Ambulation Related to ADLs: S for sit<>stand and min guard A for ambulation with RW      OT Goals(current goals can now be found in the care plan section)    Visit Information  Last OT Received On: 10/16/13 Assistance Needed: +1 History of Present Illness: 58 yo female with chronic diastolic chf, dm, hld, crf on 2 L o2 cont at home, copd comes in with worsening  swelling ble esp left more than right (right has been wrapped and being treated for chronic wound which is improving) but left is more swollen than usual and more red          Cognition  Cognition Arousal/Alertness: Awake/alert Behavior During Therapy: WFL for tasks assessed/performed Overall Cognitive Status: Within Functional Limits for tasks assessed    Mobility  Bed Mobility Overal bed mobility: Modified Independent General bed mobility comments: uses momentum and rail to get self to EOB--has hospital bed at home Transfers Overall transfer level: Needs assistance Equipment used: Rolling walker (2 wheeled) Transfers: Sit to/from Stand Sit to Stand: Supervision          End of Session OT - End of Session Equipment Utilized During Treatment: Rolling walker;Oxygen (2 liters) Activity Tolerance: Patient tolerated treatment well Patient left: in chair;with nursing/sitter in room Nurse Communication:  (Nursing student: please put pillow under legs and give pt call bell and phone before leaving room)       Evette GeorgesLeonard, Sonda Coppens Eva [phone] 10/16/2013, 12:30 PM

## 2013-10-16 NOTE — Care Management Note (Signed)
CARE MANAGEMENT NOTE 10/16/2013  Patient:  Kimberly [name],Kimberly [name]   Account Number:  [id number]  Date Initiated:  10/16/2013  Documentation initiated by:  Vance PeperBRADY,Malay  Subjective/Objective Assessment:   58 yr old female admitted with bilateral leg cellulitis     Action/Plan:   Patient will need HH at discharge.[facility]   Anticipated DC Date:  10/16/2013   Anticipated DC Plan:  HOME W HOME HEALTH SERVICES      DC Planning Services  CM consult      [facility]  Resumption Of Svcs/PTA Provider   Choice offered to / List presented to:     DME arranged  NA        HH arranged  HH-1 RN  HH-2 PT      [facility]  [facility]   Status of service:  Completed, signed off Medicare Important Message given?   (If response is "NO", the following Medicare IM given date fields will be blank) Date Medicare IM given:   Date Additional Medicare IM given:    Discharge Disposition:  HOME W HOME HEALTH SERVICES

## 2013-10-17 ENCOUNTER — Encounter: Payer: Managed Care, Other (non HMO) | Admitting: Cardiology

## 2013-10-17 NOTE — Progress Notes (Signed)
HPI: FU CAD and diastolic CHF. Following carotid endarterectomy in 2011 she went into pulmonary edema. Cardiac catheterization in August of 2011 revealed and ejection fraction of 70%. The LAD had a 30% proximal and 70% distal lesion. It appeared diffusely diseased and diabetic appearing. There is a very small first marginal branch that has about 70-80% narrowing proximally. There is a tiny second marginal branch with about 50% narrowing.There is a small nondominant right coronary artery without critical narrowing. She has been treated medically. Carotid Dopplers in April of 2013 showed 60-79% right stenosis and 40-59% left stenosis. Followup recommended in 6 months. ABIs in January of 2015 showed moderate decrease on the right and mild on the left. Echocardiogram in January of 2015 showed normal LV function, grade 2 diastolic dysfunction and mild mitral regurgitation. Patient recently discharged following admission for lower extremity cellulitis. Patient also has severe COPD and chronic renal insufficiency. Since DC,    Current Outpatient Prescriptions  Medication Sig Dispense Refill  . albuterol (PROVENTIL HFA;VENTOLIN HFA) 108 (90 BASE) MCG/ACT inhaler Inhale 1-2 puffs into the lungs every 6 (six) hours as needed for wheezing or shortness of breath.      Marland Kitchen. albuterol (PROVENTIL) (2.5 MG/3ML) 0.083% nebulizer solution Take 2.5 mg by nebulization every 6 (six) hours as needed for wheezing or shortness of breath.      Marland Kitchen. aspirin EC 81 MG tablet Take 81 mg by mouth daily.      . cloNIDine (CATAPRES) 0.1 MG tablet Take 0.1 mg by mouth 2 (two) times daily.      . collagenase (SANTYL) ointment Apply topically daily.  15 g  0  . doxycycline (VIBRAMYCIN) 100 MG capsule Take 100 mg by mouth 2 (two) times daily.      . furosemide (LASIX) 40 MG tablet Take 40 mg by mouth every other day.      . insulin aspart (NOVOLOG) 100 UNIT/ML injection Inject 20-60 Units into the skin 3 (three) times daily before  meals. DEPENDS ON WHAT SHE IS GOING TO EAT      . insulin glargine (LANTUS) 100 UNIT/ML injection Inject 0.55 mLs (55 Units total) into the skin 2 (two) times daily.  10 mL  11  . levofloxacin (LEVAQUIN) 250 MG tablet Take 1 tablet (250 mg total) by mouth daily.  5 tablet  0  . metoprolol tartrate (LOPRESSOR) 25 MG tablet Take 25 mg by mouth 2 (two) times daily.      . Multiple Vitamins-Minerals (MULTIVITAMIN PO) Take 1 tablet by mouth daily.      . pregabalin (LYRICA) 75 MG capsule Take 75 mg by mouth every evening.       . traMADol (ULTRAM) 50 MG tablet Take 50 mg by mouth every 6 (six) hours as needed for moderate pain.        No current facility-administered medications for this visit.     Past Medical History  Diagnosis Date  . HYPERLIPIDEMIA   . HYPERTENSION   . CAD, NATIVE VESSEL   . PVD   . DM   . COPD   . Edema   . H/O carotid endarterectomy   . Stroke   . CHF (congestive heart failure)   . Complication of anesthesia     DIFFICULT WAKING   . Cellulitis 10/15/2013    BILATERAL  . Shortness of breath   . Chronic kidney disease     RENAL INSUFFICENCY   . Neuromuscular disorder     DIABETIC NEUROPATHY  Past Surgical History  Procedure Laterality Date  . Eye surgery    . Carotid endarterectomy      History   Social History  . Marital Status: Married    Spouse Name: N/A    Number of Children: N/A  . Years of Education: N/A   Occupational History  . Not on file.   Social History Main Topics  . Smoking status: Former Smoker    Quit date: 09/11/2008  . Smokeless tobacco: Never Used  . Alcohol Use: No  . Drug Use: No  . Sexual Activity: Not on file   Other Topics Concern  . Not on file   Social History Narrative  . No narrative on file    ROS: no fevers or chills, productive cough, hemoptysis, dysphasia, odynophagia, melena, hematochezia, dysuria, hematuria, rash, seizure activity, orthopnea, PND, pedal edema, claudication. Remaining systems are  negative.  Physical Exam: Well-developed well-nourished in no acute distress.  Skin is warm and dry.  HEENT is normal.  Neck is supple.  Chest is clear to auscultation with normal expansion.  Cardiovascular exam is regular rate and rhythm.  Abdominal exam nontender or distended. No masses palpated. Extremities show no edema. neuro grossly intact  ECG     This encounter was created in error - please disregard.

## 2013-10-28 ENCOUNTER — Other Ambulatory Visit (HOSPITAL_COMMUNITY): Payer: Managed Care, Other (non HMO)

## 2013-10-28 ENCOUNTER — Encounter: Payer: Managed Care, Other (non HMO) | Admitting: Vascular Surgery

## 2013-10-29 ENCOUNTER — Other Ambulatory Visit (HOSPITAL_COMMUNITY): Payer: Managed Care, Other (non HMO)

## 2013-10-29 ENCOUNTER — Encounter: Payer: Managed Care, Other (non HMO) | Admitting: Vascular Surgery

## 2013-11-11 ENCOUNTER — Encounter (HOSPITAL_BASED_OUTPATIENT_CLINIC_OR_DEPARTMENT_OTHER): Payer: Managed Care, Other (non HMO) | Attending: General Surgery

## 2013-11-11 ENCOUNTER — Ambulatory Visit: Payer: Managed Care, Other (non HMO) | Admitting: Cardiovascular Disease

## 2013-11-11 DIAGNOSIS — E1169 Type 2 diabetes mellitus with other specified complication: Secondary | ICD-10-CM | POA: Insufficient documentation

## 2013-11-11 DIAGNOSIS — L97409 Non-pressure chronic ulcer of unspecified heel and midfoot with unspecified severity: Secondary | ICD-10-CM | POA: Insufficient documentation

## 2013-11-28 ENCOUNTER — Encounter: Payer: Self-pay | Admitting: Cardiology

## 2013-11-28 ENCOUNTER — Ambulatory Visit (INDEPENDENT_AMBULATORY_CARE_PROVIDER_SITE_OTHER): Payer: Managed Care, Other (non HMO) | Admitting: Cardiology

## 2013-11-28 VITALS — BP 100/58 | HR 70 | Ht 64.0 in | Wt 269.4 lb

## 2013-11-28 DIAGNOSIS — I10 Essential (primary) hypertension: Secondary | ICD-10-CM

## 2013-11-28 DIAGNOSIS — I509 Heart failure, unspecified: Secondary | ICD-10-CM

## 2013-11-28 DIAGNOSIS — E785 Hyperlipidemia, unspecified: Secondary | ICD-10-CM

## 2013-11-28 DIAGNOSIS — I5030 Unspecified diastolic (congestive) heart failure: Secondary | ICD-10-CM

## 2013-11-28 DIAGNOSIS — I2510 Atherosclerotic heart disease of native coronary artery without angina pectoris: Secondary | ICD-10-CM

## 2013-11-28 DIAGNOSIS — I739 Peripheral vascular disease, unspecified: Secondary | ICD-10-CM

## 2013-11-28 NOTE — Assessment & Plan Note (Signed)
Continue Lasix. Potassium and renal function monitored by primary care.

## 2013-11-28 NOTE — Assessment & Plan Note (Signed)
Continue aspirin. Refuses statins.

## 2013-11-28 NOTE — Assessment & Plan Note (Signed)
Refuses statins. Continue diet.

## 2013-11-28 NOTE — Assessment & Plan Note (Addendum)
Continue aspirin. Refuses statins. Followed by vascular surgery. Note she has cerebrovascular disease. She has not had carotid Dopplers recently. She would prefer to discuss this with vascular surgery. I encouraged her to followup with them for this issue.

## 2013-11-28 NOTE — Assessment & Plan Note (Signed)
Blood pressure controlled. Continue present medications. 

## 2013-11-28 NOTE — Progress Notes (Signed)
HPI: FU CHF. Following carotid endarterectomy in 2011 she went into pulmonary edema. Cardiac catheterization in August of 2011 revealed and ejection fraction of 70%. The LAD had a 30% proximal and 70% distal lesion. It appeared diffusely diseased and diabetic appearing. There is a very small first marginal branch that has about 70-80% narrowing proximally. There is a tiny second marginal branch with about 50% narrowing.There is a small nondominant right coronary artery without critical narrowing. She has been treated medically. Echocardiogram was repeated in January 2015. Normal LV function, grade 2 diastolic dysfunction and mild mitral regurgitation. Carotid Dopplers in April of 2013 showed 60-79% right and 40-59% left stenosis. Followup recommended in 6 months. Lower extremity Dopplers in January 2015 showed moderate reduction on the right and mild on the left. In February of 2015 with lower extremity cellulitis and CHF. Improved with antibiotics and Lasix. Since discharge, he has some dyspnea with activities. No orthopnea or PND. Chronic mild pedal edema. No chest pain.   Current Outpatient Prescriptions  Medication Sig Dispense Refill  . albuterol (PROVENTIL HFA;VENTOLIN HFA) 108 (90 BASE) MCG/ACT inhaler Inhale 1-2 puffs into the lungs every 6 (six) hours as needed for wheezing or shortness of breath.      Marland Kitchen albuterol (PROVENTIL) (2.5 MG/3ML) 0.083% nebulizer solution Take 2.5 mg by nebulization every 6 (six) hours as needed for wheezing or shortness of breath.      Marland Kitchen aspirin EC 81 MG tablet Take 81 mg by mouth daily.      . cloNIDine (CATAPRES) 0.1 MG tablet Take 0.1 mg by mouth 2 (two) times daily.      . collagenase (SANTYL) ointment Apply topically daily.  15 g  0  . doxycycline (VIBRAMYCIN) 100 MG capsule Take 100 mg by mouth 2 (two) times daily.      . furosemide (LASIX) 40 MG tablet Take 40 mg by mouth every other day.      . insulin aspart (NOVOLOG) 100 UNIT/ML injection Inject  20-60 Units into the skin 3 (three) times daily before meals. DEPENDS ON WHAT SHE IS GOING TO EAT      . insulin glargine (LANTUS) 100 UNIT/ML injection Inject 0.55 mLs (55 Units total) into the skin 2 (two) times daily.  10 mL  11  . levofloxacin (LEVAQUIN) 250 MG tablet Take 1 tablet (250 mg total) by mouth daily.  5 tablet  0  . metoprolol tartrate (LOPRESSOR) 25 MG tablet Take 25 mg by mouth 2 (two) times daily.      . Multiple Vitamins-Minerals (MULTIVITAMIN PO) Take 1 tablet by mouth daily.      . pregabalin (LYRICA) 75 MG capsule Take 75 mg by mouth every evening.       . traMADol (ULTRAM) 50 MG tablet Take 50 mg by mouth every 6 (six) hours as needed for moderate pain.        No current facility-administered medications for this visit.     Past Medical History  Diagnosis Date  . HYPERLIPIDEMIA   . HYPERTENSION   . CAD, NATIVE VESSEL   . PVD   . DM   . COPD   . Edema   . H/O carotid endarterectomy   . Stroke   . CHF (congestive heart failure)   . Complication of anesthesia     DIFFICULT WAKING   . Cellulitis 10/15/2013    BILATERAL  . Shortness of breath   . Chronic kidney disease     RENAL INSUFFICENCY   .  Neuromuscular disorder     DIABETIC NEUROPATHY    Past Surgical History  Procedure Laterality Date  . Eye surgery    . Carotid endarterectomy      History   Social History  . Marital Status: Married    Spouse Name: N/A    Number of Children: N/A  . Years of Education: N/A   Occupational History  . Not on file.   Social History Main Topics  . Smoking status: Former Smoker    Quit date: 09/11/2008  . Smokeless tobacco: Never Used  . Alcohol Use: No  . Drug Use: No  . Sexual Activity: Not on file   Other Topics Concern  . Not on file   Social History Narrative  . No narrative on file    ROS: no fevers or chills, productive cough, hemoptysis, dysphasia, odynophagia, melena, hematochezia, dysuria, hematuria, rash, seizure activity, orthopnea,  PND. Remaining systems are negative.  Physical Exam: Well-developed obese in no acute distress.  Skin is warm and dry.  HEENT is normal.  Neck is supple. Bilateral carotid bruits Chest is clear to auscultation with normal expansion.  Cardiovascular exam is regular rate and rhythm.  Abdominal exam nontender or distended. No masses palpated. Extremities show chronic skin changes and 1+ edema. neuro grossly intact  10/14/2011-sinus rhythm with left bundle branch block.

## 2013-11-28 NOTE — Patient Instructions (Signed)
Your physician wants you to follow-up in: 6 MONTHS WITH DR CRENSHAW You will receive a reminder letter in the mail two months in advance. If you don't receive a letter, please call our office to schedule the follow-up appointment.  

## 2013-12-01 ENCOUNTER — Encounter: Payer: Self-pay | Admitting: Vascular Surgery

## 2013-12-02 ENCOUNTER — Ambulatory Visit (INDEPENDENT_AMBULATORY_CARE_PROVIDER_SITE_OTHER)
Admission: RE | Admit: 2013-12-02 | Discharge: 2013-12-02 | Disposition: A | Discharge status: Home / Self Care | Payer: Managed Care, Other (non HMO) | Source: Ambulatory Visit | Attending: Vascular Surgery | Admitting: Vascular Surgery

## 2013-12-02 ENCOUNTER — Ambulatory Visit (HOSPITAL_COMMUNITY)
Admission: RE | Admit: 2013-12-02 | Discharge: 2013-12-02 | Disposition: A | Discharge status: Home / Self Care | Payer: Managed Care, Other (non HMO) | Source: Ambulatory Visit | Attending: Vascular Surgery | Admitting: Vascular Surgery

## 2013-12-02 ENCOUNTER — Other Ambulatory Visit: Payer: Self-pay | Admitting: Vascular Surgery

## 2013-12-02 ENCOUNTER — Ambulatory Visit (INDEPENDENT_AMBULATORY_CARE_PROVIDER_SITE_OTHER): Payer: Managed Care, Other (non HMO) | Admitting: Vascular Surgery

## 2013-12-02 ENCOUNTER — Encounter: Payer: Self-pay | Admitting: Vascular Surgery

## 2013-12-02 VITALS — BP 140/67 | HR 67 | Resp 18 | Ht 64.0 in | Wt 265.0 lb

## 2013-12-02 DIAGNOSIS — L97909 Non-pressure chronic ulcer of unspecified part of unspecified lower leg with unspecified severity: Secondary | ICD-10-CM

## 2013-12-02 DIAGNOSIS — S91309A Unspecified open wound, unspecified foot, initial encounter: Secondary | ICD-10-CM

## 2013-12-02 DIAGNOSIS — I6529 Occlusion and stenosis of unspecified carotid artery: Secondary | ICD-10-CM | POA: Insufficient documentation

## 2013-12-02 DIAGNOSIS — I83893 Varicose veins of bilateral lower extremities with other complications: Secondary | ICD-10-CM | POA: Insufficient documentation

## 2013-12-02 NOTE — Progress Notes (Signed)
Subjective:     Patient ID: Kimberly [name], female   DOB: 03-02-1956, 58 y.o.   MRN: [id number]  HPI rolled morbidly obese patient with diabetes mellitus is well known to me. She previously underwent left carotid endarterectomy for severe asymptomatic stenosis in 2002. She would develop a recurrent severe stenosis in 2011 she had a redo left carotid endarterectomy. She had postoperative complications including the need for reintubation. 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 Since that time she has done well with her swallowing and has no hoarseness and has had no new neurologic complaints. She presents today with a slowly healing ulcer on the right lateral heel. She is going to the wound center. She also has chronic edema with severe skin changes but no history of previous DVT or thrombophlebitis.  Past Medical History  Diagnosis Date  . HYPERLIPIDEMIA   . HYPERTENSION   . CAD, NATIVE VESSEL   . PVD   . DM   . COPD   . Edema   . H/O carotid endarterectomy   . Stroke   . CHF (congestive heart failure)   . Complication of anesthesia     DIFFICULT WAKING   . Cellulitis 10/15/2013    BILATERAL  . Shortness of breath   . Chronic kidney disease     RENAL INSUFFICENCY   . Neuromuscular disorder     DIABETIC NEUROPATHY    History  Substance Use Topics  . Smoking status: Former Smoker    Quit date: 09/11/2008  . Smokeless tobacco: Never Used  . Alcohol Use: No    Family History  Problem Relation Age of Onset  . Cancer Mother   . Stroke Mother   . Heart disease Mother   . Peripheral vascular disease Father   . Heart disease Father     Allergies  Allergen Reactions  . Epinephrine     Increased heart rate    Current outpatient prescriptions:albuterol (PROVENTIL HFA;VENTOLIN HFA) 108 (90 BASE) MCG/ACT inhaler, Inhale 1-2 puffs into the lungs every 6 (six) hours as needed for  wheezing or shortness of breath., Disp: , Rfl: ;  albuterol (PROVENTIL) (2.5 MG/3ML) 0.083% nebulizer solution, Take 2.5 mg by nebulization every 6 (six) hours as needed for wheezing or shortness of breath., Disp: , Rfl:  aspirin EC 81 MG tablet, Take 81 mg by mouth daily., Disp: , Rfl: ;  cloNIDine (CATAPRES) 0.1 MG tablet, Take 0.1 mg by mouth 2 (two) times daily., Disp: , Rfl: ;  collagenase (SANTYL) ointment, Apply topically daily., Disp: 15 g, Rfl: 0;  doxycycline (VIBRAMYCIN) 100 MG capsule, Take 100 mg by mouth 2 (two) times daily., Disp: , Rfl: ;  furosemide (LASIX) 40 MG tablet, Take 40 mg by mouth daily. , Disp: , Rfl:  insulin aspart (NOVOLOG) 100 UNIT/ML injection, Inject 20-60 Units into the skin 3 (three) times daily before meals. DEPENDS ON WHAT SHE IS GOING TO EAT, Disp: , Rfl: ;  insulin glargine (LANTUS) 100 UNIT/ML injection, Inject 0.55 mLs (55 Units total) into the skin 2 (two) times daily., Disp: 10 mL, Rfl: 11;  levofloxacin (LEVAQUIN) 250 MG tablet, Take 1 tablet (250 mg total) by mouth daily., Disp: 5 tablet, Rfl: 0 metoprolol tartrate (LOPRESSOR) 25 MG tablet, Take 25 mg by mouth 2 (two) times daily., Disp: , Rfl: ;  Multiple Vitamins-Minerals (MULTIVITAMIN PO), Take 1 tablet by mouth daily., Disp: , Rfl: ;  pregabalin (LYRICA) 75 MG capsule, Take 75 mg by mouth every evening. , Disp: , Rfl: ;  traMADol (ULTRAM) 50 MG tablet, Take 50 mg by mouth every 6 (six) hours as needed for moderate pain. , Disp: , Rfl:   BP 140/67  Pulse 67  Resp 18  Ht 5\' 4"  (1.626 m)  Wt 265 lb (120.203 kg)  BMI 45.46 kg/m2  Body mass index is 45.46 kg/(m^2).          Review of Systems patient has multiple comorbidities including morbid obesity, history of diastolic congestive heart failure. Patient is followed by Dr. Jens Somrenshaw who saw her recently. Today he is in a wheelchair. Has dyspnea on exertion. Also type 1 diabetes mellitus. Chronic edema in lower extremities with severe thickening of  skin. Other systems negative and complete review of systems    Objective:   Physical Exam BP 140/67  Pulse 67  Resp 18  Ht 5\' 4"  (1.626 m)  Wt 265 lb (120.203 kg)  BMI 45.46 kg/m2  Gen.-alert and oriented x3 in no apparent distress HEENT normal for age-morbidly obese  Lungs no rhonchi or wheezing Cardiovascular regular rhythm no murmurs carotid pulses 3+ palpable -left carotid bruit audible Abdomen soft nontender no palpable masses-morbidly obese  Musculoskeletal free of  major deformities Skin -severe hyperpigmentation and lipodermatosclerosis bilaterally. No active venous ulcers noted. Small ulceration right lateral heel measuring 1 cm in diameter which does not appear infected.  Neurologic normal Lower extremities-large panniculus with morbid obesity-difficult to palpate pulses.  Lower extremity arterial study performed recently was reviewed. Kimberly is 0.67 on the right and 0.79 on the left with superficial femoral popliteal and tibial occlusive disease.  Today I ordered a bilateral venous reflux study and bilateral carotid duplex exam. The carotid study looked better than expected. There is mild to moderate bladder duction bilaterally which on the right seems to be significantly improved from a few years ago. Bilateral venous reflux studies revealed gross reflux throughout both great saphenous systems with no evidence of DVT. There is deep reflux noted bilaterally.       Assessment:     #1 ulceration right heel-possibly combination of venous stasis ulcer and pressure sore due to some arterial insufficiency #2 bilateral gross reflux great saphenous systems with severe skin changes and chronic edema #3 status post left carotid endarterectomy in 02 and redo left carotid endarterectomy in 2011 now with mild to moderate occlusive disease and bilateral carotid artery-asymptomatic    Plan:     #1 patient to return to wound center for treatment of right heel ulcer #2 return in 6-8 weeks  with ABIs and check on status of right heel ulcer. Patient may well need bilateral great saphenous laser ablation procedures but would like to see how right heel ulcer progresses first

## 2013-12-02 NOTE — Addendum Note (Signed)
Addended by: Sharee PimpleMCCHESNEY, MARILYN K on: 12/02/2013 05:58 PM   Modules accepted: Orders

## 2013-12-16 ENCOUNTER — Encounter (HOSPITAL_BASED_OUTPATIENT_CLINIC_OR_DEPARTMENT_OTHER): Payer: Managed Care, Other (non HMO) | Attending: General Surgery

## 2013-12-16 DIAGNOSIS — L97409 Non-pressure chronic ulcer of unspecified heel and midfoot with unspecified severity: Secondary | ICD-10-CM | POA: Insufficient documentation

## 2013-12-16 DIAGNOSIS — E1169 Type 2 diabetes mellitus with other specified complication: Secondary | ICD-10-CM | POA: Insufficient documentation

## 2014-01-06 ENCOUNTER — Encounter (HOSPITAL_COMMUNITY): Payer: Managed Care, Other (non HMO)

## 2014-01-06 ENCOUNTER — Ambulatory Visit: Payer: Managed Care, Other (non HMO) | Admitting: Vascular Surgery

## 2014-01-13 ENCOUNTER — Encounter (HOSPITAL_COMMUNITY): Payer: Managed Care, Other (non HMO)

## 2014-01-13 ENCOUNTER — Ambulatory Visit: Payer: Managed Care, Other (non HMO) | Admitting: Vascular Surgery

## 2014-01-13 ENCOUNTER — Encounter (HOSPITAL_BASED_OUTPATIENT_CLINIC_OR_DEPARTMENT_OTHER): Payer: Managed Care, Other (non HMO) | Attending: General Surgery

## 2014-01-13 DIAGNOSIS — E1169 Type 2 diabetes mellitus with other specified complication: Secondary | ICD-10-CM | POA: Insufficient documentation

## 2014-01-13 DIAGNOSIS — L97509 Non-pressure chronic ulcer of other part of unspecified foot with unspecified severity: Secondary | ICD-10-CM | POA: Insufficient documentation

## 2014-01-20 DIAGNOSIS — Z0279 Encounter for issue of other medical certificate: Secondary | ICD-10-CM

## 2014-02-10 ENCOUNTER — Encounter (HOSPITAL_BASED_OUTPATIENT_CLINIC_OR_DEPARTMENT_OTHER): Payer: Managed Care, Other (non HMO) | Attending: General Surgery

## 2014-02-10 DIAGNOSIS — E1169 Type 2 diabetes mellitus with other specified complication: Secondary | ICD-10-CM | POA: Insufficient documentation

## 2014-02-10 DIAGNOSIS — L97409 Non-pressure chronic ulcer of unspecified heel and midfoot with unspecified severity: Secondary | ICD-10-CM | POA: Insufficient documentation

## 2014-06-09 ENCOUNTER — Inpatient Hospital Stay (HOSPITAL_COMMUNITY): Payer: Managed Care, Other (non HMO)

## 2014-06-09 ENCOUNTER — Encounter (HOSPITAL_COMMUNITY): Payer: Self-pay | Admitting: Emergency Medicine

## 2014-06-09 ENCOUNTER — Emergency Department (HOSPITAL_COMMUNITY): Payer: Managed Care, Other (non HMO)

## 2014-06-09 ENCOUNTER — Inpatient Hospital Stay (HOSPITAL_COMMUNITY)
Admission: EM | Admit: 2014-06-09 | Discharge: 2014-07-05 | DRG: 286 | Disposition: A | Discharge status: Skilled Nursing Facility | Payer: Managed Care, Other (non HMO) | Attending: Internal Medicine | Admitting: Internal Medicine

## 2014-06-09 DIAGNOSIS — Z0189 Encounter for other specified special examinations: Secondary | ICD-10-CM

## 2014-06-09 DIAGNOSIS — Z794 Long term (current) use of insulin: Secondary | ICD-10-CM

## 2014-06-09 DIAGNOSIS — R601 Generalized edema: Secondary | ICD-10-CM

## 2014-06-09 DIAGNOSIS — Z7982 Long term (current) use of aspirin: Secondary | ICD-10-CM | POA: Diagnosis not present

## 2014-06-09 DIAGNOSIS — E876 Hypokalemia: Secondary | ICD-10-CM | POA: Diagnosis not present

## 2014-06-09 DIAGNOSIS — R197 Diarrhea, unspecified: Secondary | ICD-10-CM | POA: Diagnosis present

## 2014-06-09 DIAGNOSIS — Z79899 Other long term (current) drug therapy: Secondary | ICD-10-CM | POA: Diagnosis not present

## 2014-06-09 DIAGNOSIS — T380X5A Adverse effect of glucocorticoids and synthetic analogues, initial encounter: Secondary | ICD-10-CM | POA: Diagnosis not present

## 2014-06-09 DIAGNOSIS — E1169 Type 2 diabetes mellitus with other specified complication: Secondary | ICD-10-CM | POA: Diagnosis present

## 2014-06-09 DIAGNOSIS — G9341 Metabolic encephalopathy: Secondary | ICD-10-CM | POA: Diagnosis not present

## 2014-06-09 DIAGNOSIS — F419 Anxiety disorder, unspecified: Secondary | ICD-10-CM | POA: Diagnosis present

## 2014-06-09 DIAGNOSIS — E1121 Type 2 diabetes mellitus with diabetic nephropathy: Secondary | ICD-10-CM | POA: Diagnosis present

## 2014-06-09 DIAGNOSIS — G9340 Encephalopathy, unspecified: Secondary | ICD-10-CM

## 2014-06-09 DIAGNOSIS — R52 Pain, unspecified: Secondary | ICD-10-CM

## 2014-06-09 DIAGNOSIS — I081 Rheumatic disorders of both mitral and tricuspid valves: Secondary | ICD-10-CM | POA: Diagnosis present

## 2014-06-09 DIAGNOSIS — M545 Low back pain, unspecified: Secondary | ICD-10-CM | POA: Diagnosis present

## 2014-06-09 DIAGNOSIS — D649 Anemia, unspecified: Secondary | ICD-10-CM | POA: Diagnosis present

## 2014-06-09 DIAGNOSIS — Z978 Presence of other specified devices: Secondary | ICD-10-CM

## 2014-06-09 DIAGNOSIS — R188 Other ascites: Secondary | ICD-10-CM | POA: Diagnosis present

## 2014-06-09 DIAGNOSIS — J9622 Acute and chronic respiratory failure with hypercapnia: Secondary | ICD-10-CM | POA: Diagnosis not present

## 2014-06-09 DIAGNOSIS — R339 Retention of urine, unspecified: Secondary | ICD-10-CM | POA: Diagnosis present

## 2014-06-09 DIAGNOSIS — F17200 Nicotine dependence, unspecified, uncomplicated: Secondary | ICD-10-CM

## 2014-06-09 DIAGNOSIS — R4701 Aphasia: Secondary | ICD-10-CM | POA: Diagnosis not present

## 2014-06-09 DIAGNOSIS — R739 Hyperglycemia, unspecified: Secondary | ICD-10-CM | POA: Diagnosis not present

## 2014-06-09 DIAGNOSIS — I872 Venous insufficiency (chronic) (peripheral): Secondary | ICD-10-CM | POA: Diagnosis present

## 2014-06-09 DIAGNOSIS — B372 Candidiasis of skin and nail: Secondary | ICD-10-CM | POA: Diagnosis present

## 2014-06-09 DIAGNOSIS — I1310 Hypertensive heart and chronic kidney disease without heart failure, with stage 1 through stage 4 chronic kidney disease, or unspecified chronic kidney disease: Secondary | ICD-10-CM | POA: Diagnosis present

## 2014-06-09 DIAGNOSIS — E871 Hypo-osmolality and hyponatremia: Secondary | ICD-10-CM | POA: Diagnosis present

## 2014-06-09 DIAGNOSIS — Z87891 Personal history of nicotine dependence: Secondary | ICD-10-CM | POA: Diagnosis not present

## 2014-06-09 DIAGNOSIS — E039 Hypothyroidism, unspecified: Secondary | ICD-10-CM

## 2014-06-09 DIAGNOSIS — E1129 Type 2 diabetes mellitus with other diabetic kidney complication: Secondary | ICD-10-CM

## 2014-06-09 DIAGNOSIS — R609 Edema, unspecified: Secondary | ICD-10-CM

## 2014-06-09 DIAGNOSIS — I5033 Acute on chronic diastolic (congestive) heart failure: Secondary | ICD-10-CM | POA: Diagnosis present

## 2014-06-09 DIAGNOSIS — N179 Acute kidney failure, unspecified: Secondary | ICD-10-CM | POA: Diagnosis present

## 2014-06-09 DIAGNOSIS — I272 Other secondary pulmonary hypertension: Secondary | ICD-10-CM | POA: Diagnosis present

## 2014-06-09 DIAGNOSIS — R001 Bradycardia, unspecified: Secondary | ICD-10-CM | POA: Diagnosis not present

## 2014-06-09 DIAGNOSIS — I2510 Atherosclerotic heart disease of native coronary artery without angina pectoris: Secondary | ICD-10-CM | POA: Diagnosis present

## 2014-06-09 DIAGNOSIS — Z452 Encounter for adjustment and management of vascular access device: Secondary | ICD-10-CM

## 2014-06-09 DIAGNOSIS — Z6841 Body Mass Index (BMI) 40.0 and over, adult: Secondary | ICD-10-CM

## 2014-06-09 DIAGNOSIS — N183 Chronic kidney disease, stage 3 unspecified: Secondary | ICD-10-CM | POA: Diagnosis present

## 2014-06-09 DIAGNOSIS — E873 Alkalosis: Secondary | ICD-10-CM | POA: Diagnosis not present

## 2014-06-09 DIAGNOSIS — Z515 Encounter for palliative care: Secondary | ICD-10-CM

## 2014-06-09 DIAGNOSIS — I447 Left bundle-branch block, unspecified: Secondary | ICD-10-CM | POA: Diagnosis present

## 2014-06-09 DIAGNOSIS — E662 Morbid (severe) obesity with alveolar hypoventilation: Secondary | ICD-10-CM | POA: Diagnosis present

## 2014-06-09 DIAGNOSIS — R5381 Other malaise: Secondary | ICD-10-CM | POA: Diagnosis present

## 2014-06-09 DIAGNOSIS — E785 Hyperlipidemia, unspecified: Secondary | ICD-10-CM | POA: Diagnosis present

## 2014-06-09 DIAGNOSIS — I10 Essential (primary) hypertension: Secondary | ICD-10-CM

## 2014-06-09 DIAGNOSIS — I5030 Unspecified diastolic (congestive) heart failure: Secondary | ICD-10-CM

## 2014-06-09 DIAGNOSIS — Z7401 Bed confinement status: Secondary | ICD-10-CM

## 2014-06-09 DIAGNOSIS — N189 Chronic kidney disease, unspecified: Secondary | ICD-10-CM

## 2014-06-09 DIAGNOSIS — L03119 Cellulitis of unspecified part of limb: Secondary | ICD-10-CM

## 2014-06-09 DIAGNOSIS — E162 Hypoglycemia, unspecified: Secondary | ICD-10-CM | POA: Diagnosis not present

## 2014-06-09 DIAGNOSIS — G8929 Other chronic pain: Secondary | ICD-10-CM

## 2014-06-09 DIAGNOSIS — I739 Peripheral vascular disease, unspecified: Secondary | ICD-10-CM | POA: Diagnosis present

## 2014-06-09 DIAGNOSIS — I050 Rheumatic mitral stenosis: Secondary | ICD-10-CM

## 2014-06-09 DIAGNOSIS — J449 Chronic obstructive pulmonary disease, unspecified: Secondary | ICD-10-CM | POA: Diagnosis present

## 2014-06-09 DIAGNOSIS — E1122 Type 2 diabetes mellitus with diabetic chronic kidney disease: Secondary | ICD-10-CM

## 2014-06-09 DIAGNOSIS — I119 Hypertensive heart disease without heart failure: Secondary | ICD-10-CM | POA: Diagnosis present

## 2014-06-09 DIAGNOSIS — K30 Functional dyspepsia: Secondary | ICD-10-CM

## 2014-06-09 DIAGNOSIS — Z9981 Dependence on supplemental oxygen: Secondary | ICD-10-CM | POA: Diagnosis not present

## 2014-06-09 DIAGNOSIS — J9690 Respiratory failure, unspecified, unspecified whether with hypoxia or hypercapnia: Secondary | ICD-10-CM

## 2014-06-09 DIAGNOSIS — K5900 Constipation, unspecified: Secondary | ICD-10-CM | POA: Diagnosis not present

## 2014-06-09 DIAGNOSIS — J9611 Chronic respiratory failure with hypoxia: Secondary | ICD-10-CM

## 2014-06-09 DIAGNOSIS — E119 Type 2 diabetes mellitus without complications: Secondary | ICD-10-CM

## 2014-06-09 DIAGNOSIS — R410 Disorientation, unspecified: Secondary | ICD-10-CM

## 2014-06-09 HISTORY — DX: Type 2 diabetes mellitus with diabetic neuropathy, unspecified: E11.40

## 2014-06-09 LAB — COMPREHENSIVE METABOLIC PANEL
ALBUMIN: 3.1 g/dL — AB (ref 3.5–5.2)
ALT: 11 U/L (ref 0–35)
AST: 15 U/L (ref 0–37)
Alkaline Phosphatase: 159 U/L — ABNORMAL HIGH (ref 39–117)
Anion gap: 11 (ref 5–15)
BUN: 57 mg/dL — ABNORMAL HIGH (ref 6–23)
CO2: 31 mEq/L (ref 19–32)
CREATININE: 2.27 mg/dL — AB (ref 0.50–1.10)
Calcium: 9.1 mg/dL (ref 8.4–10.5)
Chloride: 86 mEq/L — ABNORMAL LOW (ref 96–112)
GFR calc Af Amer: 26 mL/min — ABNORMAL LOW (ref >90)
GFR, EST NON AFRICAN AMERICAN: 22 mL/min — AB (ref >90)
Glucose, Bld: 92 mg/dL (ref 70–99)
Potassium: 4.3 mEq/L (ref 3.7–5.3)
SODIUM: 128 meq/L — AB (ref 137–147)
TOTAL PROTEIN: 8 g/dL (ref 6.0–8.3)
Total Bilirubin: 0.6 mg/dL (ref 0.3–1.2)

## 2014-06-09 LAB — CBC WITH DIFFERENTIAL/PLATELET
BASOS PCT: 0 % (ref 0–1)
Basophils Absolute: 0 10*3/uL (ref 0.0–0.1)
EOS ABS: 0.1 10*3/uL (ref 0.0–0.7)
EOS PCT: 2 % (ref 0–5)
HEMATOCRIT: 34.4 % — AB (ref 36.0–46.0)
Hemoglobin: 11.5 g/dL — ABNORMAL LOW (ref 12.0–15.0)
Lymphocytes Relative: 9 % — ABNORMAL LOW (ref 12–46)
Lymphs Abs: 0.9 10*3/uL (ref 0.7–4.0)
MCH: 29.4 pg (ref 26.0–34.0)
MCHC: 33.4 g/dL (ref 30.0–36.0)
MCV: 88 fL (ref 78.0–100.0)
MONO ABS: 0.5 10*3/uL (ref 0.1–1.0)
Monocytes Relative: 5 % (ref 3–12)
Neutro Abs: 7.9 10*3/uL — ABNORMAL HIGH (ref 1.7–7.7)
Neutrophils Relative %: 84 % — ABNORMAL HIGH (ref 43–77)
PLATELETS: 237 10*3/uL (ref 150–400)
RBC: 3.91 MIL/uL (ref 3.87–5.11)
RDW: 14.4 % (ref 11.5–15.5)
WBC: 9.4 10*3/uL (ref 4.0–10.5)

## 2014-06-09 LAB — PRO B NATRIURETIC PEPTIDE: PRO B NATRI PEPTIDE: 4358 pg/mL — AB (ref 0–125)

## 2014-06-09 LAB — TROPONIN I: Troponin I: <0.3 ng/mL (ref <0.30)

## 2014-06-09 MED ORDER — FUROSEMIDE 10 MG/ML IJ SOLN
40.0000 mg | Freq: Once | INTRAMUSCULAR | Status: AC
  Administered 2014-06-10: 40 mg via INTRAVENOUS
  Filled 2014-06-09: qty 4

## 2014-06-09 MED ORDER — SODIUM CHLORIDE 0.9 % IV SOLN: 250.0000 mL | INTRAVENOUS | Status: DC | PRN

## 2014-06-09 MED ORDER — IOHEXOL 300 MG/ML  SOLN
25.0000 mL | INTRAMUSCULAR | Status: AC
  Administered 2014-06-09 (×2): 25 mL via ORAL

## 2014-06-09 MED ORDER — INSULIN GLARGINE 100 UNIT/ML ~~LOC~~ SOLN
55.0000 [IU] | Freq: Two times a day (BID) | SUBCUTANEOUS | Status: DC
  Filled 2014-06-09 (×3): qty 0.55

## 2014-06-09 MED ORDER — SODIUM CHLORIDE 0.9 % IJ SOLN
3.0000 mL | Freq: Two times a day (BID) | INTRAMUSCULAR | Status: DC
  Administered 2014-06-10 – 2014-06-11 (×3): 3 mL via INTRAVENOUS

## 2014-06-09 MED ORDER — PREGABALIN 75 MG PO CAPS
75.0000 mg | ORAL_CAPSULE | Freq: Every evening | ORAL | Status: DC
  Administered 2014-06-10 – 2014-06-21 (×13): 75 mg via ORAL
  Filled 2014-06-09: qty 1
  Filled 2014-06-09: qty 3
  Filled 2014-06-09 (×2): qty 1
  Filled 2014-06-09: qty 3
  Filled 2014-06-09 (×3): qty 1
  Filled 2014-06-09 (×2): qty 3
  Filled 2014-06-09 (×2): qty 1
  Filled 2014-06-09: qty 3

## 2014-06-09 MED ORDER — SODIUM CHLORIDE 0.9 % IJ SOLN: 3.0000 mL | INTRAMUSCULAR | Status: DC | PRN

## 2014-06-09 MED ORDER — FUROSEMIDE 10 MG/ML IJ SOLN
160.0000 mg | Freq: Three times a day (TID) | INTRAVENOUS | Status: DC
  Administered 2014-06-10: 160 mg via INTRAVENOUS
  Filled 2014-06-09 (×2): qty 16

## 2014-06-09 MED ORDER — CLONIDINE HCL 0.1 MG PO TABS
0.1000 mg | ORAL_TABLET | Freq: Two times a day (BID) | ORAL | Status: DC
  Administered 2014-06-10 – 2014-06-12 (×7): 0.1 mg via ORAL
  Filled 2014-06-09 (×9): qty 1

## 2014-06-09 MED ORDER — INSULIN ASPART 100 UNIT/ML ~~LOC~~ SOLN: 0.0000 [IU] | SUBCUTANEOUS | Status: DC

## 2014-06-09 MED ORDER — HEPARIN SODIUM (PORCINE) 5000 UNIT/ML IJ SOLN
5000.0000 [IU] | Freq: Three times a day (TID) | INTRAMUSCULAR | Status: DC
  Administered 2014-06-10 – 2014-06-23 (×41): 5000 [IU] via SUBCUTANEOUS
  Filled 2014-06-09 (×45): qty 1

## 2014-06-09 NOTE — ED Provider Notes (Signed)
CSN: [id number]     Arrival date & time 06/09/14  1747 History   First MD Initiated Contact with Patient 06/09/14 1902     Chief Complaint  Patient presents with  . Leg Swelling  . Weight Gain     (Consider location/radiation/quality/duration/timing/severity/associated sxs/prior Treatment) Patient is a 58 y.o. female presenting with general illness. The history is provided by the patient.  Illness Location:  Abd, legs Quality:  Swelling Severity:  Severe Onset quality:  Gradual Duration:  1 month Timing:  Constant Progression:  Worsening Chronicity:  New Context:  Pt has been having edema for past 1 mo. Was on 40 mg Lasix q d now up to 8x a day. States has put on 50 lbs in past month Relieved by:  None Worsened by:  None Ineffective treatments:  Lasix increasing Associated symptoms: no abdominal pain, no chest pain, no congestion, no diarrhea, no fatigue, no fever, no loss of consciousness, no nausea, no rash, no rhinorrhea, no shortness of breath, no vomiting and no wheezing     Past Medical History  Diagnosis Date  . HYPERLIPIDEMIA   . HYPERTENSION   . CAD, NATIVE VESSEL     May 10, 2010 cath showed a hyperdynamic LV function, she had dominant circumflex anatomy with a 70-80% small OM1. She had diffuse diabetic plaque particularly in the distal LAD. She nondominant RCA.  Nondominant  . PVD     CEA  . DM   . COPD   . Edema   . Stroke   . CHF (congestive heart failure)     Preserved EF  . Complication of anesthesia     DIFFICULT WAKING   . Cellulitis 10/15/2013    BILATERAL  . Chronic kidney disease   . Diabetic neuropathy    Past Surgical History  Procedure Laterality Date  . Eye surgery    . Carotid endarterectomy     Family History  Problem Relation Age of Onset  . Cancer Mother   . Stroke Mother   . Heart disease Mother   . Peripheral vascular disease Father   . Heart disease Father    History  Substance Use Topics  . Smoking status: Former  Smoker    Quit date: 09/11/2008  . Smokeless tobacco: Never Used  . Alcohol Use: No   OB History   Grav Para Term Preterm Abortions TAB SAB Ect Mult Living                 Review of Systems  Constitutional: Negative for fever, activity change, appetite change and fatigue.  HENT: Negative for congestion and rhinorrhea.   Eyes: Negative for discharge, redness and itching.  Respiratory: Negative for shortness of breath and wheezing.   Cardiovascular: Positive for leg swelling. Negative for chest pain.  Gastrointestinal: Positive for abdominal distention. Negative for nausea, vomiting, abdominal pain and diarrhea.  Genitourinary: Negative for dysuria and hematuria.  Musculoskeletal: Negative for back pain.  Skin: Negative for rash and wound.  Neurological: Negative for loss of consciousness and syncope.      Allergies  Epinephrine  Home Medications   Prior to Admission medications   Medication Sig Start Date End Date Taking? Authorizing Provider  acetaminophen (TYLENOL) 325 MG tablet Take 650 mg by mouth every 6 (six) hours as needed for mild pain.   Yes Historical Provider, MD  albuterol (PROVENTIL HFA;VENTOLIN HFA) 108 (90 BASE) MCG/ACT inhaler Inhale 1-2 puffs into the lungs every 6 (six) hours as needed for wheezing or  shortness of breath.   Yes Historical Provider, MD  aspirin EC 81 MG tablet Take 81 mg by mouth daily.   Yes Historical Provider, MD  cloNIDine (CATAPRES) 0.1 MG tablet Take 0.1 mg by mouth 2 (two) times daily.   Yes Historical Provider, MD  FIBER PO Take 1 tablet by mouth daily.   Yes Historical Provider, MD  furosemide (LASIX) 40 MG tablet Take 40 mg by mouth daily.    Yes Historical Provider, MD  insulin aspart (NOVOLOG) 100 UNIT/ML injection Inject 20-60 Units into the skin 3 (three) times daily before meals. DEPENDS ON WHAT SHE IS GOING TO EAT   Yes Historical Provider, MD  insulin glargine (LANTUS) 100 UNIT/ML injection Inject 55 Units into the skin 2 (two)  times daily.   Yes Historical Provider, MD  metoprolol tartrate (LOPRESSOR) 25 MG tablet Take 25 mg by mouth 2 (two) times daily.   Yes Historical Provider, MD  Multiple Vitamins-Minerals (MULTIVITAMIN PO) Take 1 tablet by mouth daily.   Yes Historical Provider, MD  pregabalin (LYRICA) 75 MG capsule Take 75 mg by mouth every evening.    Yes Historical Provider, MD   BP 147/55  Pulse 79  Temp(Src) 98 F (36.7 C) (Oral)  Resp 18  Wt 343 lb 9 oz (155.839 kg)  SpO2 98% Physical Exam  Constitutional: She is oriented to person, place, and time. She appears well-developed and well-nourished. No distress.  HENT:  Head: Normocephalic and atraumatic.  Mouth/Throat: Oropharynx is clear and moist. No oropharyngeal exudate.  Eyes: Conjunctivae and EOM are normal. Pupils are equal, round, and reactive to light. Right eye exhibits no discharge. Left eye exhibits no discharge. No scleral icterus.  Neck: Normal range of motion. Neck supple.  Cardiovascular: Normal rate, regular rhythm and normal heart sounds.   No murmur heard. Pulmonary/Chest: Effort normal and breath sounds normal. No respiratory distress. She has no wheezes. She has no rales.  Abdominal: Soft. She exhibits distension (mildly firm, edematous abdomen with some weeping). She exhibits no mass. There is no tenderness.  Musculoskeletal: She exhibits edema (3+ edema, weeping).  Neurological: She is alert and oriented to person, place, and time. She exhibits normal muscle tone. Coordination normal.  Skin: Skin is warm. No rash noted. She is not diaphoretic.    ED Course  Procedures (including critical care time) Labs Review Labs Reviewed  CBC WITH DIFFERENTIAL - Abnormal; Notable for the following:    Hemoglobin 11.5 (*)    HCT 34.4 (*)    Neutrophils Relative % 84 (*)    Neutro Abs 7.9 (*)    Lymphocytes Relative 9 (*)    All other components within normal limits  COMPREHENSIVE METABOLIC PANEL - Abnormal; Notable for the following:     Sodium 128 (*)    Chloride 86 (*)    BUN 57 (*)    Creatinine, Ser 2.27 (*)    Albumin 3.1 (*)    Alkaline Phosphatase 159 (*)    GFR calc non Af Amer 22 (*)    GFR calc Af Amer 26 (*)    All other components within normal limits  PRO B NATRIURETIC PEPTIDE - Abnormal; Notable for the following:    Pro B Natriuretic peptide (BNP) 4358.0 (*)    All other components within normal limits  URINALYSIS, ROUTINE W REFLEX MICROSCOPIC  CBC  CREATININE, SERUM  COMPREHENSIVE METABOLIC PANEL  CBC WITH DIFFERENTIAL  TSH  TROPONIN I  TROPONIN I  TROPONIN I  HEMOGLOBIN A1C  SODIUM, URINE, RANDOM  CREATININE, URINE, RANDOM    Imaging Review Dg Chest 1 View  06/09/2014   CLINICAL DATA:  Leg swelling with weight gain.  History of stroke.  EXAM: CHEST - 1 VIEW  COMPARISON:  10/13/2013 and 09/25/2013.  FINDINGS: Examination is limited by body habitus. 1855 hr. Cardiomegaly and chronic vascular congestion are stable. There is no confluent airspace opacity, pleural effusion or pneumothorax. No acute osseous findings are seen.  IMPRESSION: Cardiomegaly with mild vascular congestion.   Electronically Signed   By: Roxy Horseman M.D.   On: 06/09/2014 19:04     EKG Interpretation   Date/Time:  Tuesday June 09 2014 19:40:56 EDT Ventricular Rate:  76 PR Interval:  217 QRS Duration: 142 QT Interval:  429 QTC Calculation: 482 R Axis:   30 Text Interpretation:  Sinus rhythm Prolonged PR interval Left bundle  branch block Baseline wander in lead(s) V1 Abnormal ekg since last tracing  no significant change Confirmed by Hyacinth Meeker  MD, BRIAN ([id number]) on 06/09/2014  7:47:11 PM      MDM   MDM: 58 y.o. WF w/ PMHx of HLD, HTN, CAD, PVD, COPD, DM, CVA, CHF w/ cc: of fluid retention. Has been retaining fluid for past 1 mo. 50 lb weight gain. Having edema and weeping of legs and abd. No pain. Has tried upping lasix from 40 mg per day to 320 mg per day with PCP. Worsening. Denies f/c, chest pain, SOB, abd  pain, n/v/d. On 2 L [place] at baseline. Well appearing here, but large amount of edema/anasarca. Will check labs. Admit.  Final diagnoses:  None    Admit to Hospitalist  Pilar Jarvis, MD 06/09/14 2216

## 2014-06-09 NOTE — H&P (Addendum)
Hospitalist Admission History and Physical  Patient name: Kimberly [name] Medical record number: [id number] Date of birth: [birth year] Age: 58 y.o. Gender: female  Primary Care Provider: [name], MD  Chief Complaint: anasarca, diastolic CHF exacerbation, AKI   History of Present Illness:This is a 57 y.o. year old female with significant past medical history of morbid obesity, grade 1 diastolic dysfunction, IDDM, CAD, stage 3 CKD, chronic resp failure on 2L [place] at home  presenting with anasarca, diastolic CHF exacerbation, AKI. Pt states that she was previously on 40 mg lasix for chronic LE edema up until 1 month ago. Pt states that she has had progressive abdominal and LE swelling over the past month. Pt states that she has seen her PCP about this issue over this same time frame and has had her lasix increased from 40mf po qd to 160mg  BID. Pt states that she has had an approximate 45 lb weight gain over the past month despite increased diuretic use. Has baseline orthopnea and PND. This has minimally to mildly worsened.  Has had to use increased O2 at home at times. No wheezing. Also reports decreased urine outpt over this time frame. Denies any NSAID use. Blood sugars have been well controlled per pt. No chest pain.  Presented to the ER, T 98, HR 60s-70s, resp 10s-20s, BP 120s-140s, satting 98% on 2L. WBC 9.4, Hgb 11.5, Cr 2.27, BUN 57.  CXR w/ cardiomegaly and mild vascular congestion. Given 40mg  IV lasix x1.   Filed Weights   06/09/14 1827  Weight: 155.839 kg (343 lb 9 oz)   Filed Weights    10/13/13 1942  10/16/13 0500   Weight:  124.739 kg (275 lb)  124.3 kg (274 lb 0.5 oz)      Assessment and Plan: Kimberly [name] is a 58 y.o. year old female presenting with anasarca, AKI   Active Problems:   Anasarca   AKI (acute kidney injury)   1-Anasarca  -broad ddx including cardiac, renal, hepatic, mechanical etiologies -roughly 30kg weight gain from 10/2013 admission -morbidly  obese at baseline -check 2D [facility] consult pending -? Renal etiology given subjective oliguria/anuria-discussed case with renal Briant Cedar). He recommends high dose lasix, foley, renal u/s-renal will consult in am  -abd u/s -check LE u/s -TSH -noted venous stasis changes on exam  -mild abd TTP-check CT abd and pelvis  2-AKI  -stage 3 CKD at chronically  -stage 4 on presentation today  -ddx includes prerenal/intrarenal/postrenal etiologies  -UA pending  -renal ultrasound -CT abd pelvis pending given mild abd TTP-assess ? Obstruction given ?oliguria -check FeNa -foley  -f/u renal recs in am   3-diastolic CHF  -proBNP at/near baseline in comparison to previous labs -anasarcic on exam today  -? Predominant R heart component given exam findings of abd and LE swelling. Currently unclear.  -unclear if cardiac etiology is predominant source of above  -2D ECHO  -cycle CEs- no active CP  -cards c/s pending   4-Chronic resp failure/COPD -stable on 2L [place] currently  -no resp distress currently. Able to speak in full sentences.  -no wheezing  -likely multifactorial in setting of above.  -continue to follow  5-IDDM -lantus  -SSI, A1C   FEN/GI: heart healthy/low sodium/carb modified. Hyponatremia. Hypervolemic. Diurese and reassess.   Prophylaxis: sub q heparin  Disposition: pending further evaluation  Code Status:Full Code    Patient Active Problem List   Diagnosis Date Noted  . Anasarca 06/09/2014  . Wound, open, foot with complication 12/02/2013  .  Occlusion and stenosis of carotid artery without mention of cerebral infarction 12/02/2013  . Varicose veins of lower extremities with other complications 12/02/2013  . Chronic respiratory failure with hypoxia 10/13/2013  . On home oxygen therapy 10/13/2013  . Acute on chronic diastolic CHF (congestive heart failure), NYHA class 1 09/15/2013  . CKD (chronic kidney disease), stage III 09/15/2013  . Diastolic CHF 09/12/2013   . CAP (community acquired pneumonia) 09/10/2013  . Pneumonia 09/10/2013  . Cellulitis 09/10/2013  . TOBACCO ABUSE 06/01/2010  . CAD, NATIVE VESSEL 06/01/2010  . Edema 06/01/2010  . DM 05/31/2010  . HYPERLIPIDEMIA 05/31/2010  . HYPERTENSION 05/31/2010  . PVD 05/31/2010  . COPD 05/31/2010   Past Medical History: Past Medical History  Diagnosis Date  . HYPERLIPIDEMIA   . HYPERTENSION   . CAD, NATIVE VESSEL   . PVD   . DM   . COPD   . Edema   . H/O carotid endarterectomy   . Stroke   . CHF (congestive heart failure)   . Complication of anesthesia     DIFFICULT WAKING   . Cellulitis 10/15/2013    BILATERAL  . Shortness of breath   . Chronic kidney disease     RENAL INSUFFICENCY   . Neuromuscular disorder     DIABETIC NEUROPATHY    Past Surgical History: Past Surgical History  Procedure Laterality Date  . Eye surgery    . Carotid endarterectomy      Social History: History   Social History  . Marital Status: Married    Spouse Name: N/A    Number of Children: N/A  . Years of Education: N/A   Social History Main Topics  . Smoking status: Former Smoker    Quit date: 09/11/2008  . Smokeless tobacco: Never Used  . Alcohol Use: No  . Drug Use: No  . Sexual Activity: None   Other Topics Concern  . None   Social History Narrative  . None    Family History: Family History  Problem Relation Age of Onset  . Cancer Mother   . Stroke Mother   . Heart disease Mother   . Peripheral vascular disease Father   . Heart disease Father     Allergies: Allergies  Allergen Reactions  . Epinephrine     Increased heart rate    Current Facility-Administered Medications  Medication Dose Route Frequency Provider Last Rate Last Dose  . 0.9 %  sodium chloride infusion  250 mL Intravenous PRN Doree Albee, MD      . furosemide (LASIX) injection 40 mg  40 mg Intravenous Once Pilar Jarvis, MD      . heparin injection 5,000 Units  5,000 Units Subcutaneous 3 times per  day Doree Albee, MD      . sodium chloride 0.9 % injection 3 mL  3 mL Intravenous Q12H Doree Albee, MD      . sodium chloride 0.9 % injection 3 mL  3 mL Intravenous PRN Doree Albee, MD       Current Outpatient Prescriptions  Medication Sig Dispense Refill  . acetaminophen (TYLENOL) 325 MG tablet Take 650 mg by mouth every 6 (six) hours as needed for mild pain.      Marland Kitchen albuterol (PROVENTIL HFA;VENTOLIN HFA) 108 (90 BASE) MCG/ACT inhaler Inhale 1-2 puffs into the lungs every 6 (six) hours as needed for wheezing or shortness of breath.      Marland Kitchen aspirin EC 81 MG tablet Take 81 mg by mouth daily.      Marland Kitchen  cloNIDine (CATAPRES) 0.1 MG tablet Take 0.1 mg by mouth 2 (two) times daily.      Marland Kitchen. FIBER PO Take 1 tablet by mouth daily.      . furosemide (LASIX) 40 MG tablet Take 40 mg by mouth daily.       . insulin aspart (NOVOLOG) 100 UNIT/ML injection Inject 20-60 Units into the skin 3 (three) times daily before meals. DEPENDS ON WHAT SHE IS GOING TO EAT      . insulin glargine (LANTUS) 100 UNIT/ML injection Inject 55 Units into the skin 2 (two) times daily.      . metoprolol tartrate (LOPRESSOR) 25 MG tablet Take 25 mg by mouth 2 (two) times daily.      . Multiple Vitamins-Minerals (MULTIVITAMIN PO) Take 1 tablet by mouth daily.      . pregabalin (LYRICA) 75 MG capsule Take 75 mg by mouth every evening.        Review Of Systems: 12 point ROS negative except as noted above in HPI.  Physical Exam: Filed Vitals:   06/09/14 1931  BP: 147/55  Pulse: 79  Temp:   Resp: 18    General: alert, cooperative and morbidly obese HEENT: PERRLA and extra ocular movement intact Heart: S1, S2 normal, no murmur, rub or gallop, regular rate and rhythm Lungs: unlabored breathing and decreased breath sounds diffusely Abdomen:diffuse abd distension with mild weeping and mild TTP  Extremities: venous stasis dermatitis noted and 3+ pitting edema bilaterallyu  Skin:as above  Neurology: normal without focal  findings  Labs and Imaging: Lab Results  Component Value Date/Time   NA 128* 06/09/2014  6:18 PM   K 4.3 06/09/2014  6:18 PM   CL 86* 06/09/2014  6:18 PM   CO2 31 06/09/2014  6:18 PM   BUN 57* 06/09/2014  6:18 PM   CREATININE 2.27* 06/09/2014  6:18 PM   GLUCOSE 92 06/09/2014  6:18 PM   Lab Results  Component Value Date   WBC 9.4 06/09/2014   HGB 11.5* 06/09/2014   HCT 34.4* 06/09/2014   MCV 88.0 06/09/2014   PLT 237 06/09/2014    Dg Chest 1 View  06/09/2014   CLINICAL DATA:  Leg swelling with weight gain.  History of stroke.  EXAM: CHEST - 1 VIEW  COMPARISON:  10/13/2013 and 09/25/2013.  FINDINGS: Examination is limited by body habitus. 1855 hr. Cardiomegaly and chronic vascular congestion are stable. There is no confluent airspace opacity, pleural effusion or pneumothorax. No acute osseous findings are seen.  IMPRESSION: Cardiomegaly with mild vascular congestion.   Electronically Signed   By: Roxy HorsemanBill  Veazey M.D.   On: 06/09/2014 19:04           Doree AlbeeSteven Anola Mcgough MD  Pager: [phone]

## 2014-06-09 NOTE — ED Notes (Addendum)
Pt c/o urinary retention and approx 50lb weight gain from fluid over last month; pt sts seeping wounds from abd area; pt sts normally on home O2

## 2014-06-09 NOTE — Consult Note (Signed)
CARDIOLOGY CONSULT NOTE  Patient ID: Kimberly [name] MRN: [id number] DOB/AGE: Mar 24, 1956 58 y.o.  Admit date: 06/09/2014 Primary Physician [name], MD Primary Cardiologist Dr. Jens Som.   Chief Complaint  Weight gain  HPI:  The patient has a history of diastolic HF, PVD and non obstructive and small vessel CAD.   Echocardiogram was in January 2015 with normal LV function, grade 2 diastolic dysfunction and mild mitral regurgitation.   She presents with significant weight gain as swelling despite increased Lasix recently.  She actually denies any acute SOB.  She has had difficulty urinating unless she is standing and the it "just runs out."  She does not describe new PND or orthopnea.  She has no chest pain, neck or arm discomfort.  She does report a 50 lb weight gain in the last month.  She has been in a wheelchair with minimal ambulation because of a non healing leg wound.  She has been working with a therapist at home.  She has had no fevers or chills.  She denies any palpitations, presyncope or syncope.   In the ED her creat was 2.27 (the last creat in our system in Feb was  with a BNP of 4358.  Abdominal ultrasound did not identify ascites although it was a very limited study.    She does report that she watches her salt but she is unable to weigh herself daily.     Past Medical History  Diagnosis Date  . HYPERLIPIDEMIA   . HYPERTENSION   . CAD, NATIVE VESSEL     May 10, 2010 cath showed a hyperdynamic LV function, she had dominant circumflex anatomy with a 70-80% small OM1. She had diffuse diabetic plaque particularly in the distal LAD. She nondominant RCA.  Nondominant  . PVD     CEA  . DM   . COPD   . Edema   . Stroke   . CHF (congestive heart failure)     Preserved EF  . Complication of anesthesia     DIFFICULT WAKING   . Cellulitis 10/15/2013    BILATERAL  . Chronic kidney disease   . Diabetic neuropathy     Past Surgical History  Procedure Laterality Date    . Eye surgery    . Carotid endarterectomy      Allergies  Allergen Reactions  . Epinephrine     Increased heart rate   Prior to Admission medications   Medication Sig Start Date End Date Taking? Authorizing Provider  acetaminophen (TYLENOL) 325 MG tablet Take 650 mg by mouth every 6 (six) hours as needed for mild pain.   Yes Historical Provider, MD  albuterol (PROVENTIL HFA;VENTOLIN HFA) 108 (90 BASE) MCG/ACT inhaler Inhale 1-2 puffs into the lungs every 6 (six) hours as needed for wheezing or shortness of breath.   Yes Historical Provider, MD  aspirin EC 81 MG tablet Take 81 mg by mouth daily.   Yes Historical Provider, MD  cloNIDine (CATAPRES) 0.1 MG tablet Take 0.1 mg by mouth 2 (two) times daily.   Yes Historical Provider, MD  FIBER PO Take 1 tablet by mouth daily.   Yes Historical Provider, MD  furosemide (LASIX) 40 MG tablet Take 40 mg by mouth daily.    Yes Historical Provider, MD  insulin aspart (NOVOLOG) 100 UNIT/ML injection Inject 20-60 Units into the skin 3 (three) times daily before meals. DEPENDS ON WHAT SHE IS GOING TO EAT   Yes Historical Provider, MD  insulin glargine (LANTUS) 100  UNIT/ML injection Inject 55 Units into the skin 2 (two) times daily.   Yes Historical Provider, MD  metoprolol tartrate (LOPRESSOR) 25 MG tablet Take 25 mg by mouth 2 (two) times daily.   Yes Historical Provider, MD  Multiple Vitamins-Minerals (MULTIVITAMIN PO) Take 1 tablet by mouth daily.   Yes Historical Provider, MD  pregabalin (LYRICA) 75 MG capsule Take 75 mg by mouth every evening.    Yes Historical Provider, MD    (Not in a hospital admission) Family History  Problem Relation Age of Onset  . Cancer Mother   . Stroke Mother   . Heart disease Mother   . Peripheral vascular disease Father   . Heart disease Father     History   Social History  . Marital Status: Married    Spouse Name: N/A    Number of Children: N/A  . Years of Education: N/A   Occupational History  . Not on  file.   Social History Main Topics  . Smoking status: Former Smoker    Quit date: 09/11/2008  . Smokeless tobacco: Never Used  . Alcohol Use: No  . Drug Use: No  . Sexual Activity: Not on file   Other Topics Concern  . Not on file   Social History Narrative  . No narrative on file     ROS:  Constipation, GERD, nausea.  Otherwise as stated in the HPI and negative for all other systems.  Physical Exam: Blood pressure 130/40, pulse 78, temperature 98 F (36.7 C), temperature source Oral, resp. rate 22, weight 343 lb 9 oz (155.839 kg), SpO2 99.00%.  GENERAL:  Well appearing and pleasant HEENT:  Pupils equal round and reactive, fundi not visualized, oral mucosa unremarkable NECK:  No jugular venous distention, waveform within normal limits, carotid upstroke brisk and symmetric, bilateral bruits, no thyromegaly LYMPHATICS:  No cervical, inguinal adenopathy LUNGS:  Clear to auscultation bilaterally BACK:  No CVA tenderness CHEST:  Unremarkable HEART:  PMI not displaced or sustained,S1 and S2 within normal limits, no S3, no S4, no clicks, no rubs, no murmurs ABD:  Flat, positive bowel sounds normal in frequency in pitch, no bruits, no rebound, no guarding, no midline pulsatile mass, no hepatomegaly, no splenomegaly, (exam extremely compromised by morbid obesity).  EXT:  2 plus pulses throughout, severe diffuse edema (anasarca), no cyanosis no clubbing SKIN:  No rashes no nodules, chronic lower extremity erythema and venous stasis changes.  NEURO:  Cranial nerves II through XII grossly intact, motor grossly intact throughout PSYCH:  Cognitively intact, oriented to person place and time   Labs: Lab Results  Component Value Date   BUN 57* 06/09/2014   Lab Results  Component Value Date   CREATININE 2.27* 06/09/2014   Lab Results  Component Value Date   NA 128* 06/09/2014   K 4.3 06/09/2014   CL 86* 06/09/2014   CO2 31 06/09/2014    Lab Results  Component Value Date   WBC 9.4  06/09/2014   HGB 11.5* 06/09/2014   HCT 34.4* 06/09/2014   MCV 88.0 06/09/2014   PLT 237 06/09/2014    Lab Results  Component Value Date   ALT 11 06/09/2014   AST 15 06/09/2014   ALKPHOS 159* 06/09/2014   BILITOT 0.6 06/09/2014    Radiology:   CXR: Cardiomegaly with mild vascular congestion.  EKG:NSR, rate 76, LBBB, baseline artifact precludes adequate analysis.    ASSESSMENT AND PLAN:   Anasarca:  See below.  Note the TSH and workup  per the primary team.  Renal insufficiency:  I suspect that there is some element of obstruction (bladder outlet).  She put out 1100 cc of urine after the Foley without any diuretic.  Therefore I would be somewhat careful on the diuresis at first  (80 IV BID).  (Note renal suggested high dose diuretic.)  1500 cc fluid restrict.  I would suggest compression stockings which she wears at home.  She and I talked about the need to try to keep the feet elevated.  Aim for slow and steady volume loss.   Anemia:  This is chronic.    Hyponatremia:  This is new compared with 7 months ago.  Follow during diuresis.   Free water restrict.    TSH:  Hypothyroid.  I don't see this in her previous history.  This certainly could be contributing to the anasarca.  Further work up and thyroid replacement therapy per the primary team.   Signed: Rollene RotundaJames Elizabethann Lackey 06/09/2014, 11:59 PM

## 2014-06-09 NOTE — ED Notes (Addendum)
Patient presents stating she has gained about 50 pounds over the last month.  States this has happened in the past and has been admitted for the same.  Patient has blisters to the lower legs that are not open and legs are red.  Has had cellulitis in the past.

## 2014-06-09 NOTE — ED Provider Notes (Signed)
58 year old female, history of congestive heart failure, history of severe peripheral edema which is gradually worsening over the last month. She is currently taking 320 mg of Lasix every day but continues to swell, have orthopnea and is unable to get out of bed because of severe dyspnea. On exam the patient has diffuse peripheral edema with anasarca up to the middle of her chest. Her lungs have mild rales but she is not in respiratory distress. Her vital signs are otherwise unremarkable. She will need to have admission to the hospital with intravenous diuretic, cardiac monitoring and evaluation of possible decompensated congestive heart failure. The patient is in agreement with this plan.   EKG Interpretation  Date/Time:  Tuesday June 09 2014 19:40:56 EDT Ventricular Rate:  76 PR Interval:  217 QRS Duration: 142 QT Interval:  429 QTC Calculation: 482 R Axis:   30 Text Interpretation:  Sinus rhythm Prolonged PR interval Left bundle branch block Baseline wander in lead(s) V1 Abnormal ekg since last tracing no significant change Confirmed by Jaheem Hedgepath  MD, Alyria Krack ([id number]) on 06/09/2014 7:47:11 PM      I saw and evaluated the patient, reviewed the resident's note and I agree with the findings and plan.  Final diagnoses:  Anasarca  AKI (acute kidney injury)     Vida RollerBrian D Madeline Bebout, MD 06/10/14 0830

## 2014-06-09 NOTE — ED Notes (Signed)
Urinary cath inserted  Perineum cleaned and cath inserted.  York CeriseLes Nichols, EMT and Erich MontaneKristina Mansfield, EMT present.

## 2014-06-10 ENCOUNTER — Encounter (HOSPITAL_COMMUNITY): Payer: Self-pay | Admitting: Cardiology

## 2014-06-10 DIAGNOSIS — I5033 Acute on chronic diastolic (congestive) heart failure: Secondary | ICD-10-CM

## 2014-06-10 DIAGNOSIS — N179 Acute kidney failure, unspecified: Secondary | ICD-10-CM

## 2014-06-10 DIAGNOSIS — M7989 Other specified soft tissue disorders: Secondary | ICD-10-CM

## 2014-06-10 DIAGNOSIS — I059 Rheumatic mitral valve disease, unspecified: Secondary | ICD-10-CM

## 2014-06-10 DIAGNOSIS — I509 Heart failure, unspecified: Secondary | ICD-10-CM

## 2014-06-10 DIAGNOSIS — R609 Edema, unspecified: Secondary | ICD-10-CM

## 2014-06-10 DIAGNOSIS — N189 Chronic kidney disease, unspecified: Secondary | ICD-10-CM

## 2014-06-10 DIAGNOSIS — E039 Hypothyroidism, unspecified: Secondary | ICD-10-CM

## 2014-06-10 DIAGNOSIS — E119 Type 2 diabetes mellitus without complications: Secondary | ICD-10-CM

## 2014-06-10 DIAGNOSIS — M79609 Pain in unspecified limb: Secondary | ICD-10-CM

## 2014-06-10 LAB — CBC WITH DIFFERENTIAL/PLATELET
Basophils Absolute: 0 10*3/uL (ref 0.0–0.1)
Basophils Relative: 0 % (ref 0–1)
Eosinophils Absolute: 0.1 10*3/uL (ref 0.0–0.7)
Eosinophils Relative: 1 % (ref 0–5)
HCT: 32.3 % — ABNORMAL LOW (ref 36.0–46.0)
HEMOGLOBIN: 10.7 g/dL — AB (ref 12.0–15.0)
LYMPHS ABS: 1 10*3/uL (ref 0.7–4.0)
Lymphocytes Relative: 11 % — ABNORMAL LOW (ref 12–46)
MCH: 28.8 pg (ref 26.0–34.0)
MCHC: 33.1 g/dL (ref 30.0–36.0)
MCV: 87.1 fL (ref 78.0–100.0)
MONOS PCT: 7 % (ref 3–12)
Monocytes Absolute: 0.7 10*3/uL (ref 0.1–1.0)
NEUTROS ABS: 7.4 10*3/uL (ref 1.7–7.7)
Neutrophils Relative %: 81 % — ABNORMAL HIGH (ref 43–77)
PLATELETS: 224 10*3/uL (ref 150–400)
RBC: 3.71 MIL/uL — AB (ref 3.87–5.11)
RDW: 14.4 % (ref 11.5–15.5)
WBC: 9.2 10*3/uL (ref 4.0–10.5)

## 2014-06-10 LAB — URINALYSIS, ROUTINE W REFLEX MICROSCOPIC
Bilirubin Urine: NEGATIVE
GLUCOSE, UA: NEGATIVE mg/dL
HGB URINE DIPSTICK: NEGATIVE
Ketones, ur: NEGATIVE mg/dL
LEUKOCYTES UA: NEGATIVE
Nitrite: NEGATIVE
PH: 5 (ref 5.0–8.0)
PROTEIN: NEGATIVE mg/dL
SPECIFIC GRAVITY, URINE: 1.006 (ref 1.005–1.030)
Urobilinogen, UA: 0.2 mg/dL (ref 0.0–1.0)

## 2014-06-10 LAB — COMPREHENSIVE METABOLIC PANEL
ALT: 10 U/L (ref 0–35)
AST: 17 U/L (ref 0–37)
Albumin: 2.9 g/dL — ABNORMAL LOW (ref 3.5–5.2)
Alkaline Phosphatase: 149 U/L — ABNORMAL HIGH (ref 39–117)
Anion gap: 11 (ref 5–15)
BUN: 57 mg/dL — ABNORMAL HIGH (ref 6–23)
CO2: 31 mEq/L (ref 19–32)
CREATININE: 2.19 mg/dL — AB (ref 0.50–1.10)
Calcium: 8.9 mg/dL (ref 8.4–10.5)
Chloride: 87 mEq/L — ABNORMAL LOW (ref 96–112)
GFR calc Af Amer: 27 mL/min — ABNORMAL LOW (ref >90)
GFR calc non Af Amer: 23 mL/min — ABNORMAL LOW (ref >90)
GLUCOSE: 80 mg/dL (ref 70–99)
Potassium: 3.7 mEq/L (ref 3.7–5.3)
SODIUM: 129 meq/L — AB (ref 137–147)
TOTAL PROTEIN: 7.6 g/dL (ref 6.0–8.3)
Total Bilirubin: 0.7 mg/dL (ref 0.3–1.2)

## 2014-06-10 LAB — GLUCOSE, CAPILLARY
GLUCOSE-CAPILLARY: 73 mg/dL (ref 70–99)
GLUCOSE-CAPILLARY: 100 mg/dL — AB (ref 70–99)
Glucose-Capillary: 74 mg/dL (ref 70–99)
Glucose-Capillary: 113 mg/dL — ABNORMAL HIGH (ref 70–99)
Glucose-Capillary: 96 mg/dL (ref 70–99)

## 2014-06-10 LAB — CBG MONITORING, ED
GLUCOSE-CAPILLARY: 67 mg/dL — AB (ref 70–99)
GLUCOSE-CAPILLARY: 73 mg/dL (ref 70–99)

## 2014-06-10 LAB — T3: T3, Total: 61.2 ng/dl — ABNORMAL LOW (ref 80.0–204.0)

## 2014-06-10 LAB — HEMOGLOBIN A1C
Hgb A1c MFr Bld: 7 % — ABNORMAL HIGH (ref <5.7)
MEAN PLASMA GLUCOSE: 154 mg/dL — AB (ref <117)

## 2014-06-10 LAB — T4, FREE: FREE T4: 0.86 ng/dL (ref 0.80–1.80)

## 2014-06-10 LAB — TSH: TSH: 18.36 u[IU]/mL — AB (ref 0.350–4.500)

## 2014-06-10 LAB — TROPONIN I

## 2014-06-10 LAB — T3, FREE: T3, Free: 2 pg/mL — ABNORMAL LOW (ref 2.3–4.2)

## 2014-06-10 LAB — SODIUM, URINE, RANDOM: Sodium, Ur: 28 mEq/L

## 2014-06-10 LAB — CREATININE, URINE, RANDOM: Creatinine, Urine: 30.07 mg/dL

## 2014-06-10 MED ORDER — FUROSEMIDE 10 MG/ML IJ SOLN
80.0000 mg | Freq: Two times a day (BID) | INTRAMUSCULAR | Status: DC
  Filled 2014-06-10: qty 8

## 2014-06-10 MED ORDER — SORBITOL 70 % SOLN
30.0000 mL | Freq: Every day | Status: DC | PRN
  Administered 2014-06-10 – 2014-06-11 (×2): 30 mL via ORAL
  Filled 2014-06-10 (×5): qty 30

## 2014-06-10 MED ORDER — INSULIN ASPART 100 UNIT/ML ~~LOC~~ SOLN
0.0000 [IU] | Freq: Three times a day (TID) | SUBCUTANEOUS | Status: DC
  Administered 2014-06-11: 1 [IU] via SUBCUTANEOUS
  Administered 2014-06-11: 2 [IU] via SUBCUTANEOUS
  Administered 2014-06-11: 3 [IU] via SUBCUTANEOUS
  Administered 2014-06-12: 7 [IU] via SUBCUTANEOUS
  Administered 2014-06-12: 9 [IU] via SUBCUTANEOUS

## 2014-06-10 MED ORDER — FUROSEMIDE 10 MG/ML IJ SOLN
80.0000 mg | Freq: Two times a day (BID) | INTRAMUSCULAR | Status: DC
  Administered 2014-06-10 – 2014-06-14 (×8): 80 mg via INTRAVENOUS
  Filled 2014-06-10 (×10): qty 8

## 2014-06-10 MED ORDER — INSULIN ASPART 100 UNIT/ML ~~LOC~~ SOLN: 0.0000 [IU] | Freq: Three times a day (TID) | SUBCUTANEOUS | Status: DC

## 2014-06-10 MED ORDER — INSULIN ASPART 100 UNIT/ML ~~LOC~~ SOLN
0.0000 [IU] | Freq: Every day | SUBCUTANEOUS | Status: DC
  Administered 2014-06-11: 3 [IU] via SUBCUTANEOUS

## 2014-06-10 MED ORDER — LEVOTHYROXINE SODIUM 25 MCG PO TABS
25.0000 ug | ORAL_TABLET | Freq: Every day | ORAL | Status: DC
  Administered 2014-06-10 – 2014-06-18 (×9): 25 ug via ORAL
  Filled 2014-06-10 (×10): qty 1

## 2014-06-10 NOTE — Progress Notes (Signed)
UR complete.  Ermina Oberman RN, MSN 

## 2014-06-10 NOTE — Progress Notes (Signed)
VASCULAR LAB PRELIMINARY  PRELIMINARY  PRELIMINARY  PRELIMINARY  Bilateral lower extremity venous duplex completed.    Preliminary report:  Technically limited due to anasarca and poor image quality. No obvious evidence of deep vein or superficial thrombus in the portions of the veins imaged. Unable to evaluate for a Baker's cyst due to swelling and body habitus. Results are not completely conclusive.  Caprice Mccaffrey, RVS 06/10/2014, 3:09 PM

## 2014-06-10 NOTE — Progress Notes (Signed)
[facility] PROGRESS NOTE  Kimberly ManilaSusan K [name] ZOX:[id number] DOB: 07-31-1956 DOA: 06/09/2014 PCP: Ginette OttoSTONEKING,[name] THOMAS, MD  Assessment/Plan: #1 anasarca Questionable etiology. Likely secondary to diastolic dysfunction in the setting of new onset hypothyroidism and acute on chronic renal failure. TSH was elevated at 18.360. Check a free T4. Check a T3. 2-D echo has been obtained that shows grade 2 diastolic dysfunction with EF of 55-60% with no wall motion abnormalities. LFTs are within normal limits. Renal function trending down. Continue IV Lasix. Will start patient on Synthroid. Follow.  #2 acute on chronic diastolic CHF exacerbation Questionable etiology. Cardiac enzymes negative x3. 2-D echo with a EF of 55-60% with no wall motion abnormalities.I/O equal -3.4 L during this hospitalization. Current weight is 153 kg. Continue IV Lasix. Started Synthroid. Cardiology following and appreciate input and recommendations.  #3 acute on chronic kidney disease stage III Questionable etiology. Chronic kidney disease likely secondary to volume control diabetes and hypertension. May be prerenal azotemia secondary to acute on chronic diastolic CHF in the setting of urinary obstruction. Once Foley catheter was placed patient with good urine output. Renal ultrasound negative for hydronephrosis however poor visualization secondary to body habitus. Maintain Foley catheter. Continue IV diuretics. Nephrology following and appreciate input and recommendations.  #4 new onset hypothyroidism TSH on admission was 18.36. No prior history of hypothyroidism. Will check a free T4. Check a T3. Will start patient on Synthroid. Will need outpatient followup.  #5 hyponatremia Likely secondary to a hypervolemic hyponatremia as patient is volume overloaded. And also in the setting of hypothyroidism. Continue IV Lasix and follow sodium levels. Check a free T4 and a T3. Will start patient on oral Synthroid. Will likely need  outpatient followup.  #6 chronic respiratory failure/COPD Stable. Follow.  #7 diabetes mellitus Patient with low CBGs. Likely secondary to acute on chronic kidney disease. Will discontinue basal Lantus for now. Continue sliding scale insulin. Once renal function improves and CBGs at 2 increased may resume basal insulin.  #8 prophylaxis Heparin for DVT prophylaxis.  Code Status: Full Family Communication: Updated patient no family at bedside. Disposition Plan: Home when medically stable.   Consultants:  Cardiology: Dr. Antoine PocheHochrein 06/09/14  Nephrology  Procedures:  2-D echo 06/10/2014  CT abdomen and pelvis 06/10/2014  Chest x-ray 06/09/2014  Abdominal ultrasound 06/09/2014  Renal ultrasound 06/09/2014  Lower extremity Dopplers 06/10/2014  Antibiotics:  None  HPI/Subjective: Patient states shortness of breath has improved. No chest pain. No complaints.  Objective: Filed Vitals:   06/10/14 1700  BP: 145/48  Pulse: 83  Temp: 98.1 F (36.7 C)  Resp: 16    Intake/Output Summary (Last 24 hours) at 06/10/14 1849 Last data filed at 06/10/14 1638  Gross per 24 hour  Intake    300 ml  Output   3700 ml  Net  -3400 ml   Filed Weights   06/09/14 1827 06/10/14 0251  Weight: 155.839 kg (343 lb 9 oz) 153.225 kg (337 lb 12.8 oz)    Exam:   General:  nad  Cardiovascular: Distant heart sounds. Regular rate rhythm no murmurs rubs or gallops.  Respiratory: Diffuse crackles. No wheezing.  Abdomen: Distended. Positive bowel sounds. Nontender. Anasarca.  Musculoskeletal: No clubbing or cyanosis. 3+ bilateral lower extremity edema to lower abdomen.  Data Reviewed: Basic Metabolic Panel:  Recent Labs Lab 06/09/14 1818 06/10/14 0400  NA 128* 129*  K 4.3 3.7  CL 86* 87*  CO2 31 31  GLUCOSE 92 80  BUN 57* 57*  CREATININE 2.27* 2.19*  CALCIUM 9.1 8.9   Liver Function Tests:  Recent Labs Lab 06/09/14 1818 06/10/14 0400  AST 15 17  ALT 11 10  ALKPHOS  159* 149*  BILITOT 0.6 0.7  PROT 8.0 7.6  ALBUMIN 3.1* 2.9*   No results found for this basename: LIPASE, AMYLASE,  in the last 168 hours No results found for this basename: AMMONIA,  in the last 168 hours CBC:  Recent Labs Lab 06/09/14 1818 06/10/14 0400  WBC 9.4 9.2  NEUTROABS 7.9* 7.4  HGB 11.5* 10.7*  HCT 34.4* 32.3*  MCV 88.0 87.1  PLT 237 224   Cardiac Enzymes:  Recent Labs Lab 06/09/14 2310 06/10/14 0400 06/10/14 1035  TROPONINI <0.30 <0.30 <0.30   BNP (last 3 results)  Recent Labs  09/10/13 1827 10/13/13 1916 06/09/14 1818  PROBNP 4050.0* 1519.0* 4358.0*   CBG:  Recent Labs Lab 06/10/14 0140 06/10/14 0401 06/10/14 0843 06/10/14 1313 06/10/14 1635  GLUCAP 73 73 74 100* 113*    No results found for this or any previous visit (from the past 240 hour(s)).   Studies: Ct Abdomen Pelvis Wo Contrast  06/10/2014   CLINICAL DATA:  Urinary retention. 50 lb weight gain over the last month. Seeping wounds from the abdominal area.  EXAM: CT ABDOMEN AND PELVIS WITHOUT CONTRAST  TECHNIQUE: Multidetector CT imaging of the abdomen and pelvis was performed following the standard protocol without IV contrast.  COMPARISON:  11/16/2006  FINDINGS: Technically limited study due to patient's body habitus.  Small right pleural effusion. Calcified granuloma in the left lung base.  Diffuse edema throughout the subcutaneous fat of the abdomen and pelvis. Free fluid in the abdomen and pelvis probably representing ascites.  The unenhanced appearance of the liver, spleen, pancreas, adrenal glands, inferior vena cava, and retroperitoneal lymph nodes is unremarkable. Calcifications in the renal hila bilaterally probably represent vascular calcifications. Extensive calcification of the abdominal aorta without aneurysm. Stomach, small bowel, and colon are not abnormally distended. Contrast material flows through to the colon without evidence of obstruction. No free air in the abdomen.   Pelvis: No pelvic mass or lymphadenopathy. Appendix is not identified. No significant lymphadenopathy in the pelvis. Bladder is decompressed with a Foley catheter. Degenerative changes in the lumbar spine. No destructive bone lesions appreciated.  IMPRESSION: Technically limited study due to patient's body habitus. There is extensive edema throughout the subcutaneous fat of the abdomen. Small right pleural effusion. Moderate abdominal/pelvic ascites.   Electronically Signed   By: Burman Nieves M.D.   On: 06/10/2014 02:01   Dg Chest 1 View  06/09/2014   CLINICAL DATA:  Leg swelling with weight gain.  History of stroke.  EXAM: CHEST - 1 VIEW  COMPARISON:  10/13/2013 and 09/25/2013.  FINDINGS: Examination is limited by body habitus. 1855 hr. Cardiomegaly and chronic vascular congestion are stable. There is no confluent airspace opacity, pleural effusion or pneumothorax. No acute osseous findings are seen.  IMPRESSION: Cardiomegaly with mild vascular congestion.   Electronically Signed   By: Roxy Horseman M.D.   On: 06/09/2014 19:04   US Renal  06/09/2014   CLINICAL DATA:  Acute kidney injury. Weight gain of 50 lb in 30 days.  EXAM: RENAL/URINARY TRACT ULTRASOUND COMPLETE  COMPARISON:  CT abdomen and pelvis 11/16/2006  FINDINGS: Examination is technically limited due to the patient's body habitus and diffuse edema.  Right Kidney:  Right kidney is not visualized.  Left Kidney:  Left kidney is not visualized.  Bladder:  Bladder is not visualized.  IMPRESSION: Kidneys and bladder are not visualized due to patient's body habitus and diffuse edema. Nondiagnostic study.   Electronically Signed   By: Burman Nieves M.D.   On: 06/09/2014 23:59   US Abdomen Limited  06/10/2014   CLINICAL DATA:  Abdominal distention.  EXAM: LIMITED ABDOMEN ULTRASOUND FOR ASCITES  TECHNIQUE: Limited ultrasound survey for ascites was performed in all four abdominal quadrants.  COMPARISON:  None.  FINDINGS: Images obtained of all 4  quadrants. Examination is technically limited due to body habitus and edema. No ascites is identified. However, the diagnostic quality of the study is limited in ascites is not entirely excluded based on this study.  IMPRESSION: Technically limited study. No ascites identified but due to technical limitations cannot be excluded.   Electronically Signed   By: Burman Nieves M.D.   On: 06/10/2014 00:05    Scheduled Meds: . cloNIDine  0.1 mg Oral BID  . furosemide  80 mg Intravenous Q12H  . heparin  5,000 Units Subcutaneous 3 times per day  . insulin aspart  0-5 Units Subcutaneous QHS  . insulin aspart  0-9 Units Subcutaneous TID WC  . levothyroxine  25 mcg Oral QAC breakfast  . pregabalin  75 mg Oral QPM  . sodium chloride  3 mL Intravenous Q12H   Continuous Infusions:   Principal Problem:   Anasarca Active Problems:   DM   HYPERLIPIDEMIA   TOBACCO ABUSE   COPD   Acute on chronic diastolic CHF (congestive heart failure), NYHA class 1   CKD (chronic kidney disease), stage III   AKI (acute kidney injury)   Unspecified hypothyroidism    Time spent: 30 MINS    [facility] MD [facility] Pager [phone]. If 7PM-7AM, please contact night-coverage at [email or web address], password [facility] 06/10/2014, 6:48 PM  LOS: 1 day

## 2014-06-10 NOTE — Progress Notes (Addendum)
  Results for [name], Kimberly [name] (MRN [id number]) as of 06/10/2014 13:16  Ref. Range 06/10/2014 00:10 06/10/2014 01:40 06/10/2014 04:01 06/10/2014 08:43  Glucose-Capillary Latest Range: 70-99 mg/dL 67 (L) 73 73 74   Noted patient is ordered Lantus 55 units BID, Novolog 0-9 units Q4H as an inpatient for inpatient glycemic control. In reviewing the chart, noted patient did NOT receive any Lantus last night nor this morning and CBGs are trending low. Talked with patient and she states that she takes Lantus 55 units BID and Novolog 10-60 units TID with meals (if CBG is at least 100 mg/dl she takes 10 units and adds 1 unit for every 10 mg/dl). According to the patient she does not skip Lantus doses and confirms that she takes the Lantus BID everyday. She reports that her glucose is usually 80-150 mg/dl but over the past week she did have one low of 57 mg/dl one morning. She reports that she has had diabetes for over 30 years and if she sees that her glucose is running low consistently she may take a few units less of her basal insulin. However, she has not had to make any changes with her Lantus recently.  Not sure why glucose is running low especially since she has not received any basal insulin since arriving at the hospital.  Will continue to follow as an inpatient and make recommendations as more data is collected.   In talking with the patient she states that her husband provides total care for her and has to cook for her. She states that his idea of cooking is fixing a frozen dinner. Discussed sodium content in frozen meals and encouraged patient to stay away for frozen dinners and foods high in sodium. Patient states that she has discussed high sodium in frozen meals with her husband and has asked him to fix more fresh vegetables.  Patient also states that she feels her husband is getting worn out caring for her and she would like to see if her insurance would cover someone coming out to her home to help with ADLs.  Placed consult for Case Management to home needs.   Thanks, Orlando PennerMarie Markez Dowland, RN, MSN, CCRN Diabetes Coordinator [facility] [phone] (Team Pager) [phone] ([facility] office) [phone] [facility] office)

## 2014-06-10 NOTE — Consult Note (Signed)
I have seen and examined this patient and agree with the plan of care  [facility]WEBB,Takaya Hyslop W 06/10/2014, 7:10 PM

## 2014-06-10 NOTE — H&P (Signed)
Patient arrived from ED via Stretcher. Alert and oriented x 4. No complaints of pain. Vital Signs stable. 2 liters O2 via N.C. Patient unable to stand. 4cm skin tear observed on left buttock near anal cleft. Redness/cellulitis noted to lower extremities and abdomen.SR on telemetry. Foley catheter draining clear yellow urine.

## 2014-06-10 NOTE — Consult Note (Signed)
Reason for Consult: Acute on Chronic Renal Failure Referring Physician: Dr. Irine Seal HPI:  58 y.o. female w/ PMHx of HTN, HLD, CAD, chronic dCHF, PVD, DM type II w/ neuropathy (HbA1c 7.9 in 10/2013), and COPD presented to the ED yesterday w/ complaints of weight gain, increased abdominal girth, and SOB. The patient claims that over the past 6 weeks or so, she has noticed worsening LE edema and abdominal swelling and distension different from her baseline. She states this has progressively gotten worse over time. She also describes symptoms of mild worsening SOB, PND, and orthopnea at home. She uses O2 intermittently at home but claims that she has required it more frequently lately. The patient also admits to recent significant weight gain, stating that the last time she weighed herself 4-6 weeks ago, she weighed 289 lbs, now she is 343 lbs. She previously had been taking Lasix 40 mg po qd at home, but was recently increased significantly by her PCP (Dr. Felipa Eth) to 160 mg bid w/ no change in her weight, edema, or urine output. The patient also admits to increased fatigue, mild dizziness, dry skin, and cold intolerance w/ chills for quite some time. Lastly, she claims she has had a very difficult time urinating recently, stating that she is unable to go and then has resulting urgency w/ incontinence which has been more recent, over the past 3 days. This has been accompanied by left flank pain. She denies recent NSAID use, h/o renal stones, hematuria or dysuria. She denies any recent chest pain, palpitations, nausea, vomiting, or diarrhea.   PMH:   Past Medical History  Diagnosis Date  . HYPERLIPIDEMIA   . HYPERTENSION   . CAD, NATIVE VESSEL     May 10, 2010 cath showed a hyperdynamic LV function, she had dominant circumflex anatomy with a 70-80% small OM1. She had diffuse diabetic plaque particularly in the distal LAD. She nondominant RCA.  Nondominant  . PVD     CEA  . DM   . COPD   .  Edema   . Stroke   . CHF (congestive heart failure)     Preserved EF  . Complication of anesthesia     DIFFICULT WAKING   . Cellulitis 10/15/2013    BILATERAL  . Chronic kidney disease   . Diabetic neuropathy   . Anasarca 05/2014    PSH:   Past Surgical History  Procedure Laterality Date  . Eye surgery    . Carotid endarterectomy      Allergies:  Allergies  Allergen Reactions  . Epinephrine     Increased heart rate    Medications:   Prior to Admission medications   Medication Sig Start Date End Date Taking? Authorizing Provider  acetaminophen (TYLENOL) 325 MG tablet Take 650 mg by mouth every 6 (six) hours as needed for mild pain.   Yes Historical Provider, MD  albuterol (PROVENTIL HFA;VENTOLIN HFA) 108 (90 BASE) MCG/ACT inhaler Inhale 1-2 puffs into the lungs every 6 (six) hours as needed for wheezing or shortness of breath.   Yes Historical Provider, MD  aspirin EC 81 MG tablet Take 81 mg by mouth daily.   Yes Historical Provider, MD  cloNIDine (CATAPRES) 0.1 MG tablet Take 0.1 mg by mouth 2 (two) times daily.   Yes Historical Provider, MD  FIBER PO Take 1 tablet by mouth daily.   Yes Historical Provider, MD  furosemide (LASIX) 40 MG tablet Take 40 mg by mouth daily.    Yes Historical Provider, MD  insulin aspart (NOVOLOG) 100 UNIT/ML injection Inject 20-60 Units into the skin 3 (three) times daily before meals. DEPENDS ON WHAT SHE IS GOING TO EAT   Yes Historical Provider, MD  insulin glargine (LANTUS) 100 UNIT/ML injection Inject 55 Units into the skin 2 (two) times daily.   Yes Historical Provider, MD  metoprolol tartrate (LOPRESSOR) 25 MG tablet Take 25 mg by mouth 2 (two) times daily.   Yes Historical Provider, MD  Multiple Vitamins-Minerals (MULTIVITAMIN PO) Take 1 tablet by mouth daily.   Yes Historical Provider, MD  pregabalin (LYRICA) 75 MG capsule Take 75 mg by mouth every evening.    Yes Historical Provider, MD    Discontinued Meds:   Medications Discontinued  During This Encounter  Medication Reason  . levofloxacin (LEVAQUIN) 250 MG tablet Completed Course  . insulin glargine (LANTUS) 100 UNIT/ML injection Inpatient Standard  . collagenase (SANTYL) ointment Inpatient Standard  . doxycycline (VIBRAMYCIN) 100 MG capsule Completed Course  . traMADol (ULTRAM) 50 MG tablet Patient has not taken in last 30 days  . albuterol (PROVENTIL) (2.5 MG/3ML) 0.083% nebulizer solution Patient has not taken in last 30 days  . furosemide (LASIX) 160 mg in dextrose 5 % 50 mL IVPB       Family History:   Family History  Problem Relation Age of Onset  . Cancer Mother     Breast, NHL  . Stroke Mother   . Peripheral vascular disease Father   . CAD Father 20    Social History:  reports that she quit smoking about 5 years ago. She has never used smokeless tobacco. She reports that she does not drink alcohol or use illicit drugs. Review of Systems  Constitutional: Positive for chills and malaise/fatigue. Negative for fever and diaphoresis.       Positive for weight gain  HENT: Negative for congestion, hearing loss and sore throat.   Eyes: Positive for blurred vision. Negative for double vision and photophobia.  Respiratory: Positive for shortness of breath and wheezing. Negative for cough, hemoptysis, sputum production and stridor.   Cardiovascular: Positive for orthopnea, leg swelling and PND. Negative for chest pain and palpitations.  Gastrointestinal: Negative for heartburn, nausea, vomiting, abdominal pain, diarrhea and constipation.  Genitourinary: Positive for urgency, frequency and flank pain (left). Negative for dysuria.  Musculoskeletal: Negative for back pain, myalgias and neck pain.  Skin: Negative for itching and rash.  Neurological: Positive for dizziness and weakness. Negative for tingling, tremors, sensory change, focal weakness and headaches.  Endo/Heme/Allergies: Does not bruise/bleed easily.  Psychiatric/Behavioral: Negative for depression and  hallucinations. The patient is not nervous/anxious.     Blood pressure 140/68, pulse 78, temperature 97.6 F (36.4 C), temperature source Oral, resp. rate 20, height 5' 4"  (1.626 m), weight 337 lb 12.8 oz (153.225 kg), SpO2 98.00%.  Weight: 06/10/14 337 lb 12.8 oz (153.225 kg)  06/09/14 343 lb 9 oz (155.839 kg) 12/02/13 265 lb (120.203 kg)   Physical Exam  General: Morbidly obese female, awake, alert, cooperative, NAD. HEENT: PERRL, EOMI. Moist mucus membranes Neck: Full range of motion without pain, supple, no lymphadenopathy or carotid bruits. Left-sided scar present (previous endarterectomy).  Lungs: Air entry equal bilaterally, normal work of respiration. Scattered wheezes, mild bibasilar crackles. On 2L O2 via [place].  Heart: RRR, no murmurs, gallops, or rubs Abdomen: Obese, tense, woody pitting edema extending to mid-back. Distended. Distant bowel sounds present. Foley catheter in place.  Extremities: No cyanosis. +4 pitting edema (also w/ some underlying non-pitting edema?). Significant  erythema and venous stasis changes on LE's bilaterally. No tenderness on examination.  Neurologic: Alert & oriented X3, cranial nerves II-XII intact, strength grossly intact, sensation intact to light touch   Creatinine, Ser  Date/Time Value Ref Range Status  06/10/2014  4:00 AM 2.19* 0.50 - 1.10 mg/dL Final  06/09/2014  6:18 PM 2.27* 0.50 - 1.10 mg/dL Final  10/15/2013  6:54 AM 1.89* 0.50 - 1.10 mg/dL Final  10/14/2013  6:55 AM 1.81* 0.50 - 1.10 mg/dL Final  10/13/2013  7:16 PM 1.77* 0.50 - 1.10 mg/dL Final  09/14/2013  5:18 AM 1.73* 0.50 - 1.10 mg/dL Final  09/13/2013  4:40 AM 1.78* 0.50 - 1.10 mg/dL Final  09/12/2013  4:26 AM 2.28* 0.50 - 1.10 mg/dL Final  09/11/2013  1:00 AM 1.90* 0.50 - 1.10 mg/dL Final  09/10/2013  2:41 PM 1.74* 0.50 - 1.10 mg/dL Final  09/15/2012  9:23 AM 1.40* 0.50 - 1.10 mg/dL Final  09/15/2012  9:15 AM 1.32* 0.50 - 1.10 mg/dL Final  12/28/2011  3:19 PM 1.3* 0.4 - 1.2 mg/dL Final   05/14/2010  4:40 AM 1.24* 0.4 - 1.2 mg/dL Final  05/13/2010  5:25 AM 1.18  0.4 - 1.2 mg/dL Final  05/12/2010  5:00 AM 1.22* 0.4 - 1.2 mg/dL Final  05/11/2010  3:40 AM 1.12  0.4 - 1.2 mg/dL Final  05/10/2010  4:00 AM 1.10  0.4 - 1.2 mg/dL Final  05/09/2010  6:15 AM 1.05  0.4 - 1.2 mg/dL Final  05/08/2010  4:30 AM 1.14  0.4 - 1.2 mg/dL Final  05/07/2010  3:58 AM 1.16  0.4 - 1.2 mg/dL Final  05/06/2010  4:11 AM 1.19  0.4 - 1.2 mg/dL Final  05/05/2010  5:15 PM 1.36* 0.4 - 1.2 mg/dL Final  05/03/2010  3:08 PM 1.24* 0.4 - 1.2 mg/dL Final  04/19/2010  8:42 AM 1.0  0.4 - 1.2 mg/dL Final     Results for orders placed during the hospital encounter of 06/09/14 (from the past 48 hour(s))  CBC WITH DIFFERENTIAL     Status: Abnormal   Collection Time    06/09/14  6:18 PM      Result Value Ref Range   WBC 9.4  4.0 - 10.5 K/uL   RBC 3.91  3.87 - 5.11 MIL/uL   Hemoglobin 11.5 (*) 12.0 - 15.0 g/dL   HCT 34.4 (*) 36.0 - 46.0 %   MCV 88.0  78.0 - 100.0 fL   MCH 29.4  26.0 - 34.0 pg   MCHC 33.4  30.0 - 36.0 g/dL   RDW 14.4  11.5 - 15.5 %   Platelets 237  150 - 400 K/uL   Neutrophils Relative % 84 (*) 43 - 77 %   Neutro Abs 7.9 (*) 1.7 - 7.7 K/uL   Lymphocytes Relative 9 (*) 12 - 46 %   Lymphs Abs 0.9  0.7 - 4.0 K/uL   Monocytes Relative 5  3 - 12 %   Monocytes Absolute 0.5  0.1 - 1.0 K/uL   Eosinophils Relative 2  0 - 5 %   Eosinophils Absolute 0.1  0.0 - 0.7 K/uL   Basophils Relative 0  0 - 1 %   Basophils Absolute 0.0  0.0 - 0.1 K/uL  COMPREHENSIVE METABOLIC PANEL     Status: Abnormal   Collection Time    06/09/14  6:18 PM      Result Value Ref Range   Sodium 128 (*) 137 - 147 mEq/L   Potassium 4.3  3.7 -  5.3 mEq/L   Chloride 86 (*) 96 - 112 mEq/L   CO2 31  19 - 32 mEq/L   Glucose, Bld 92  70 - 99 mg/dL   BUN 57 (*) 6 - 23 mg/dL   Creatinine, Ser 2.27 (*) 0.50 - 1.10 mg/dL   Calcium 9.1  8.4 - 10.5 mg/dL   Total Protein 8.0  6.0 - 8.3 g/dL   Albumin 3.1 (*) 3.5 - 5.2 g/dL   AST 15  0 - 37 U/L    ALT 11  0 - 35 U/L   Alkaline Phosphatase 159 (*) 39 - 117 U/L   Total Bilirubin 0.6  0.3 - 1.2 mg/dL   GFR calc non Af Amer 22 (*) >90 mL/min   GFR calc Af Amer 26 (*) >90 mL/min   Comment: (NOTE)     The eGFR has been calculated using the CKD EPI equation.     This calculation has not been validated in all clinical situations.     eGFR's persistently <90 mL/min signify possible Chronic Kidney     Disease.   Anion gap 11  5 - 15  PRO B NATRIURETIC PEPTIDE     Status: Abnormal   Collection Time    06/09/14  6:18 PM      Result Value Ref Range   Pro B Natriuretic peptide (BNP) 4358.0 (*) 0 - 125 pg/mL  TSH     Status: Abnormal   Collection Time    06/09/14 11:10 PM      Result Value Ref Range   TSH 18.360 (*) 0.350 - 4.500 uIU/mL  TROPONIN I     Status: None   Collection Time    06/09/14 11:10 PM      Result Value Ref Range   Troponin I <0.30  <0.30 ng/mL   Comment:            Due to the release kinetics of cTnI,     a negative result within the first hours     of the onset of symptoms does not rule out     myocardial infarction with certainty.     If myocardial infarction is still suspected,     repeat the test at appropriate intervals.  URINALYSIS, ROUTINE W REFLEX MICROSCOPIC     Status: Abnormal   Collection Time    06/10/14 12:02 AM      Result Value Ref Range   Color, Urine YELLOW  YELLOW   APPearance CLOUDY (*) CLEAR   Specific Gravity, Urine 1.006  1.005 - 1.030   pH 5.0  5.0 - 8.0   Glucose, UA NEGATIVE  NEGATIVE mg/dL   Hgb urine dipstick NEGATIVE  NEGATIVE   Bilirubin Urine NEGATIVE  NEGATIVE   Ketones, ur NEGATIVE  NEGATIVE mg/dL   Protein, ur NEGATIVE  NEGATIVE mg/dL   Urobilinogen, UA 0.2  0.0 - 1.0 mg/dL   Nitrite NEGATIVE  NEGATIVE   Leukocytes, UA NEGATIVE  NEGATIVE   Comment: MICROSCOPIC NOT DONE ON URINES WITH NEGATIVE PROTEIN, BLOOD, LEUKOCYTES, NITRITE, OR GLUCOSE <1000 mg/dL.  SODIUM, URINE, RANDOM     Status: None   Collection Time    06/10/14  12:02 AM      Result Value Ref Range   Sodium, Ur 28    CREATININE, URINE, RANDOM     Status: None   Collection Time    06/10/14 12:02 AM      Result Value Ref Range   Creatinine, Urine 30.07  CBG MONITORING, ED     Status: Abnormal   Collection Time    06/10/14 12:10 AM      Result Value Ref Range   Glucose-Capillary 67 (*) 70 - 99 mg/dL  CBG MONITORING, ED     Status: None   Collection Time    06/10/14  1:40 AM      Result Value Ref Range   Glucose-Capillary 73  70 - 99 mg/dL  COMPREHENSIVE METABOLIC PANEL     Status: Abnormal   Collection Time    06/10/14  4:00 AM      Result Value Ref Range   Sodium 129 (*) 137 - 147 mEq/L   Potassium 3.7  3.7 - 5.3 mEq/L   Chloride 87 (*) 96 - 112 mEq/L   CO2 31  19 - 32 mEq/L   Glucose, Bld 80  70 - 99 mg/dL   BUN 57 (*) 6 - 23 mg/dL   Creatinine, Ser 2.19 (*) 0.50 - 1.10 mg/dL   Calcium 8.9  8.4 - 10.5 mg/dL   Total Protein 7.6  6.0 - 8.3 g/dL   Albumin 2.9 (*) 3.5 - 5.2 g/dL   AST 17  0 - 37 U/L   ALT 10  0 - 35 U/L   Alkaline Phosphatase 149 (*) 39 - 117 U/L   Total Bilirubin 0.7  0.3 - 1.2 mg/dL   GFR calc non Af Amer 23 (*) >90 mL/min   GFR calc Af Amer 27 (*) >90 mL/min   Comment: (NOTE)     The eGFR has been calculated using the CKD EPI equation.     This calculation has not been validated in all clinical situations.     eGFR's persistently <90 mL/min signify possible Chronic Kidney     Disease.   Anion gap 11  5 - 15  CBC WITH DIFFERENTIAL     Status: Abnormal   Collection Time    06/10/14  4:00 AM      Result Value Ref Range   WBC 9.2  4.0 - 10.5 K/uL   RBC 3.71 (*) 3.87 - 5.11 MIL/uL   Hemoglobin 10.7 (*) 12.0 - 15.0 g/dL   HCT 32.3 (*) 36.0 - 46.0 %   MCV 87.1  78.0 - 100.0 fL   MCH 28.8  26.0 - 34.0 pg   MCHC 33.1  30.0 - 36.0 g/dL   RDW 14.4  11.5 - 15.5 %   Platelets 224  150 - 400 K/uL   Neutrophils Relative % 81 (*) 43 - 77 %   Neutro Abs 7.4  1.7 - 7.7 K/uL   Lymphocytes Relative 11 (*) 12 - 46 %    Lymphs Abs 1.0  0.7 - 4.0 K/uL   Monocytes Relative 7  3 - 12 %   Monocytes Absolute 0.7  0.1 - 1.0 K/uL   Eosinophils Relative 1  0 - 5 %   Eosinophils Absolute 0.1  0.0 - 0.7 K/uL   Basophils Relative 0  0 - 1 %   Basophils Absolute 0.0  0.0 - 0.1 K/uL  TROPONIN I     Status: None   Collection Time    06/10/14  4:00 AM      Result Value Ref Range   Troponin I <0.30  <0.30 ng/mL   Comment:            Due to the release kinetics of cTnI,     a negative result within the first hours  of the onset of symptoms does not rule out     myocardial infarction with certainty.     If myocardial infarction is still suspected,     repeat the test at appropriate intervals.  GLUCOSE, CAPILLARY     Status: None   Collection Time    06/10/14  4:01 AM      Result Value Ref Range   Glucose-Capillary 73  70 - 99 mg/dL  GLUCOSE, CAPILLARY     Status: None   Collection Time    06/10/14  8:43 AM      Result Value Ref Range   Glucose-Capillary 74  70 - 99 mg/dL   Comment 1 Notify RN      Ct Abdomen Pelvis Wo Contrast  06/10/2014   CLINICAL DATA:  Urinary retention. 50 lb weight gain over the last month. Seeping wounds from the abdominal area.  EXAM: CT ABDOMEN AND PELVIS WITHOUT CONTRAST  TECHNIQUE: Multidetector CT imaging of the abdomen and pelvis was performed following the standard protocol without IV contrast.  COMPARISON:  11/16/2006  FINDINGS: Technically limited study due to patient's body habitus.  Small right pleural effusion. Calcified granuloma in the left lung base.  Diffuse edema throughout the subcutaneous fat of the abdomen and pelvis. Free fluid in the abdomen and pelvis probably representing ascites.  The unenhanced appearance of the liver, spleen, pancreas, adrenal glands, inferior vena cava, and retroperitoneal lymph nodes is unremarkable. Calcifications in the renal hila bilaterally probably represent vascular calcifications. Extensive calcification of the abdominal aorta without  aneurysm. Stomach, small bowel, and colon are not abnormally distended. Contrast material flows through to the colon without evidence of obstruction. No free air in the abdomen.  Pelvis: No pelvic mass or lymphadenopathy. Appendix is not identified. No significant lymphadenopathy in the pelvis. Bladder is decompressed with a Foley catheter. Degenerative changes in the lumbar spine. No destructive bone lesions appreciated.  IMPRESSION: Technically limited study due to patient's body habitus. There is extensive edema throughout the subcutaneous fat of the abdomen. Small right pleural effusion. Moderate abdominal/pelvic ascites.   Electronically Signed   By: Lucienne Capers M.D.   On: 06/10/2014 02:01   Dg Chest 1 View  06/09/2014   CLINICAL DATA:  Leg swelling with weight gain.  History of stroke.  EXAM: CHEST - 1 VIEW  COMPARISON:  10/13/2013 and 09/25/2013.  FINDINGS: Examination is limited by body habitus. 1855 hr. Cardiomegaly and chronic vascular congestion are stable. There is no confluent airspace opacity, pleural effusion or pneumothorax. No acute osseous findings are seen.  IMPRESSION: Cardiomegaly with mild vascular congestion.   Electronically Signed   By: Camie Patience M.D.   On: 06/09/2014 19:04   US Renal  06/09/2014   CLINICAL DATA:  Acute kidney injury. Weight gain of 50 lb in 30 days.  EXAM: RENAL/URINARY TRACT ULTRASOUND COMPLETE  COMPARISON:  CT abdomen and pelvis 11/16/2006  FINDINGS: Examination is technically limited due to the patient's body habitus and diffuse edema.  Right Kidney:  Right kidney is not visualized.  Left Kidney:  Left kidney is not visualized.  Bladder:  Bladder is not visualized.  IMPRESSION: Kidneys and bladder are not visualized due to patient's body habitus and diffuse edema. Nondiagnostic study.   Electronically Signed   By: Lucienne Capers M.D.   On: 06/09/2014 23:59   US Abdomen Limited  06/10/2014   CLINICAL DATA:  Abdominal distention.  EXAM: LIMITED ABDOMEN  ULTRASOUND FOR ASCITES  TECHNIQUE: Limited ultrasound survey for ascites  was performed in all four abdominal quadrants.  COMPARISON:  None.  FINDINGS: Images obtained of all 4 quadrants. Examination is technically limited due to body habitus and edema. No ascites is identified. However, the diagnostic quality of the study is limited in ascites is not entirely excluded based on this study.  IMPRESSION: Technically limited study. No ascites identified but due to technical limitations cannot be excluded.   Electronically Signed   By: Lucienne Capers M.D.   On: 06/10/2014 00:05     Assessment/Plan: 58 y/o F w/ multiple co-morbidities, admitted on 06/09/14 w/ volume overload, likely 2/2 decompensated dCHF and new onset hypothyroidism. Also found to have acute on chronic renal failure and possible urinary obstruction.   Volume Overload- Previous ECHO from 09/2013 shows EF of 55% w/ grade 2 diastolic dysfunction. Had previously been taking lasix 40 mg po qd at home, increased to 160 mg bid by her PCP w/ no improvement in volume status or diuresis. Has gained ~70 lbs since 11/2013 according to recent weights. Patient w/ severe anasarca on exam. Has received 40 IV lasix in ED, now on 80 mg IV BID. Likely contributed to by recent discovery of hypothyroidism, TSH 18.3. No previous thyroid disease. Likely causing decreased cardiac output contributing to volume status. Agree w/ current diuretic regimen. Strict I/O's, daily weights.  Acute on CKD stage 3- Baseline Cr ~1.6 according to previous labs, 2.27 on admission. UA w/out protein. CKD likely 2/2 DM vs uncontrolled HTN in the past. Acute injury associated w/ volume overload vs possible bladder dysfunction; patient states she has been having significant urinary retention. Renal US not able to see kidneys 2/2 body habitus. Urine output ~2L in the past 24 hours. Cr improved this AM to 2.19. Continue Lasix as above. Will need outpatient renal follow up on discharge.   Hypothyroidism- TSH 18.36 on admission. No previous h/o hypothyroidism. Also admits to recent fatigue, dry skin, cold intolerance, and weight gain. Decrease in metabolism likely resulting in poor cardiac output, contributing somewhat to vole status. T3, fT4 pending. Started on Synthroid 25 mcg qAM.  Hyponatremia- 128 on admission, 2/2 volume overload vs hypothyroidism. Continue to monitor.    Luanne Bras 06/10/2014, 10:08 AM  PGY-2, Internal Medicine Pager: [phone]

## 2014-06-10 NOTE — Progress Notes (Signed)
  Echocardiogram 2D Echocardiogram has been performed.  Georgian CoWILLIAMS, Dail Meece 06/10/2014, 12:24 PM

## 2014-06-10 NOTE — ED Provider Notes (Signed)
I saw and evaluated the patient, reviewed the resident's note and I agree with the findings and plan.  Please see my separate note regarding my evaluation of the patient.   Vida RollerBrian D Jasiah Elsen, MD 06/10/14 0830

## 2014-06-11 DIAGNOSIS — I050 Rheumatic mitral stenosis: Secondary | ICD-10-CM

## 2014-06-11 DIAGNOSIS — R601 Generalized edema: Secondary | ICD-10-CM

## 2014-06-11 DIAGNOSIS — N183 Chronic kidney disease, stage 3 (moderate): Secondary | ICD-10-CM

## 2014-06-11 DIAGNOSIS — I2510 Atherosclerotic heart disease of native coronary artery without angina pectoris: Secondary | ICD-10-CM

## 2014-06-11 DIAGNOSIS — N179 Acute kidney failure, unspecified: Secondary | ICD-10-CM

## 2014-06-11 DIAGNOSIS — I5033 Acute on chronic diastolic (congestive) heart failure: Principal | ICD-10-CM

## 2014-06-11 DIAGNOSIS — I270 Primary pulmonary hypertension: Secondary | ICD-10-CM

## 2014-06-11 LAB — COMPREHENSIVE METABOLIC PANEL
ALK PHOS: 133 U/L — AB (ref 39–117)
ALT: 8 U/L (ref 0–35)
ANION GAP: 12 (ref 5–15)
AST: 13 U/L (ref 0–37)
Albumin: 2.7 g/dL — ABNORMAL LOW (ref 3.5–5.2)
BILIRUBIN TOTAL: 1 mg/dL (ref 0.3–1.2)
BUN: 56 mg/dL — AB (ref 6–23)
CHLORIDE: 91 meq/L — AB (ref 96–112)
CO2: 31 mEq/L (ref 19–32)
Calcium: 8.8 mg/dL (ref 8.4–10.5)
Creatinine, Ser: 2.06 mg/dL — ABNORMAL HIGH (ref 0.50–1.10)
GFR, EST AFRICAN AMERICAN: 29 mL/min — AB (ref >90)
GFR, EST NON AFRICAN AMERICAN: 25 mL/min — AB (ref >90)
GLUCOSE: 124 mg/dL — AB (ref 70–99)
Potassium: 4.2 mEq/L (ref 3.7–5.3)
Sodium: 134 mEq/L — ABNORMAL LOW (ref 137–147)
Total Protein: 7.1 g/dL (ref 6.0–8.3)

## 2014-06-11 LAB — CBC WITH DIFFERENTIAL/PLATELET
BASOS ABS: 0 10*3/uL (ref 0.0–0.1)
BASOS PCT: 0 % (ref 0–1)
Eosinophils Absolute: 0.2 10*3/uL (ref 0.0–0.7)
Eosinophils Relative: 2 % (ref 0–5)
HCT: 30.3 % — ABNORMAL LOW (ref 36.0–46.0)
Hemoglobin: 10 g/dL — ABNORMAL LOW (ref 12.0–15.0)
Lymphocytes Relative: 11 % — ABNORMAL LOW (ref 12–46)
Lymphs Abs: 0.9 10*3/uL (ref 0.7–4.0)
MCH: 29.9 pg (ref 26.0–34.0)
MCHC: 33 g/dL (ref 30.0–36.0)
MCV: 90.4 fL (ref 78.0–100.0)
Monocytes Absolute: 0.4 10*3/uL (ref 0.1–1.0)
Monocytes Relative: 5 % (ref 3–12)
NEUTROS PCT: 82 % — AB (ref 43–77)
Neutro Abs: 6.9 10*3/uL (ref 1.7–7.7)
Platelets: 210 10*3/uL (ref 150–400)
RBC: 3.35 MIL/uL — ABNORMAL LOW (ref 3.87–5.11)
RDW: 14.7 % (ref 11.5–15.5)
WBC: 8.4 10*3/uL (ref 4.0–10.5)

## 2014-06-11 LAB — GLUCOSE, CAPILLARY
GLUCOSE-CAPILLARY: 196 mg/dL — AB (ref 70–99)
GLUCOSE-CAPILLARY: 240 mg/dL — AB (ref 70–99)
Glucose-Capillary: 129 mg/dL — ABNORMAL HIGH (ref 70–99)
Glucose-Capillary: 273 mg/dL — ABNORMAL HIGH (ref 70–99)

## 2014-06-11 MED ORDER — ALBUTEROL SULFATE (2.5 MG/3ML) 0.083% IN NEBU
3.0000 mL | INHALATION_SOLUTION | RESPIRATORY_TRACT | Status: DC | PRN
  Administered 2014-06-16: 3 mL via RESPIRATORY_TRACT
  Filled 2014-06-11: qty 3

## 2014-06-11 MED ORDER — TIOTROPIUM BROMIDE MONOHYDRATE 18 MCG IN CAPS
18.0000 ug | ORAL_CAPSULE | Freq: Every day | RESPIRATORY_TRACT | Status: DC
  Administered 2014-06-11 – 2014-06-22 (×8): 18 ug via RESPIRATORY_TRACT
  Filled 2014-06-11 (×4): qty 5

## 2014-06-11 MED ORDER — INSULIN GLARGINE 100 UNIT/ML ~~LOC~~ SOLN
5.0000 [IU] | Freq: Every day | SUBCUTANEOUS | Status: DC
  Administered 2014-06-11: 5 [IU] via SUBCUTANEOUS
  Filled 2014-06-11 (×2): qty 0.05

## 2014-06-11 MED ORDER — LEVOFLOXACIN IN D5W 750 MG/150ML IV SOLN
750.0000 mg | INTRAVENOUS | Status: DC
  Administered 2014-06-11 – 2014-06-15 (×3): 750 mg via INTRAVENOUS
  Filled 2014-06-11 (×3): qty 150

## 2014-06-11 MED ORDER — METHYLPREDNISOLONE SODIUM SUCC 125 MG IJ SOLR
80.0000 mg | Freq: Three times a day (TID) | INTRAMUSCULAR | Status: DC
  Administered 2014-06-11 – 2014-06-12 (×3): 80 mg via INTRAVENOUS
  Filled 2014-06-11 (×6): qty 1.28

## 2014-06-11 NOTE — Progress Notes (Addendum)
Patient Name: LORIJEAN HUSSER Date of Encounter: 06/11/2014     Principal Problem:   Anasarca Active Problems:   DM   HYPERLIPIDEMIA   TOBACCO ABUSE   COPD   Acute on chronic diastolic CHF (congestive heart failure), NYHA class 1   CKD (chronic kidney disease), stage III   AKI (acute kidney injury)   Unspecified hypothyroidism    SUBJECTIVE  Mild SOB. No CP. Discomfort with folley, did not sleep very well. Back pain resolved after diuresis. States she had bilateral LE cellulitis for 3 yrs now  CURRENT MEDS . cloNIDine  0.1 mg Oral BID  . furosemide  80 mg Intravenous Q12H  . heparin  5,000 Units Subcutaneous 3 times per day  . insulin aspart  0-5 Units Subcutaneous QHS  . insulin aspart  0-9 Units Subcutaneous TID WC  . levothyroxine  25 mcg Oral QAC breakfast  . pregabalin  75 mg Oral QPM  . sodium chloride  3 mL Intravenous Q12H    OBJECTIVE  Filed Vitals:   06/10/14 1412 06/10/14 1700 06/10/14 2220 06/11/14 0530  BP: 130/52 145/48 131/47 128/55  Pulse: 83 83 88 90  Temp: 97.6 F (36.4 C) 98.1 F (36.7 C) 98.3 F (36.8 C) 98.2 F (36.8 C)  TempSrc: Oral Oral Oral Oral  Resp: 18 16 18 18   Height:      Weight:    331 lb 3.2 oz (150.231 kg)  SpO2: 98% 97% 97% 94%    Intake/Output Summary (Last 24 hours) at 06/11/14 0919 Last data filed at 06/11/14 0536  Gross per 24 hour  Intake    390 ml  Output   3925 ml  Net  -3535 ml   Filed Weights   06/09/14 1827 06/10/14 0251 06/11/14 0530  Weight: 343 lb 9 oz (155.839 kg) 337 lb 12.8 oz (153.225 kg) 331 lb 3.2 oz (150.231 kg)    PHYSICAL EXAM  General: Pleasant, NAD. Morbidly obese with bilateral LE cellulitis Neuro: Alert and oriented X 3. Moves all extremities spontaneously. Psych: Normal affect. HEENT:  Normal  Neck: Supple without bruits or JVD. Lungs:  Resp regular and unlabored, CTA. Heart: RRR no s3, s4, or murmurs. Abdomen: Soft, non-tender, non-distended, BS + x 4.  Extremities: No clubbing,  cyanosis or edema. DP/PT/Radials 2+ and equal bilaterally.  Accessory Clinical Findings  CBC  Recent Labs  06/10/14 0400 06/11/14 0502  WBC 9.2 8.4  NEUTROABS 7.4 6.9  HGB 10.7* 10.0*  HCT 32.3* 30.3*  MCV 87.1 90.4  PLT 224 210   Basic Metabolic Panel  Recent Labs  06/10/14 0400 06/11/14 0502  NA 129* 134*  K 3.7 4.2  CL 87* 91*  CO2 31 31  GLUCOSE 80 124*  BUN 57* 56*  CREATININE 2.19* 2.06*  CALCIUM 8.9 8.8   Liver Function Tests  Recent Labs  06/10/14 0400 06/11/14 0502  AST 17 13  ALT 10 8  ALKPHOS 149* 133*  BILITOT 0.7 1.0  PROT 7.6 7.1  ALBUMIN 2.9* 2.7*   Cardiac Enzymes  Recent Labs  06/09/14 2310 06/10/14 0400 06/10/14 1035  TROPONINI <0.30 <0.30 <0.30   Hemoglobin A1C  Recent Labs  06/09/14 1818  HGBA1C 7.0*    Thyroid Function Tests  Recent Labs  06/09/14 2310 06/10/14 1403  TSH 18.360*  --   T3FREE  --  2.0*    TELE NSR with LBBB and HR 80-100    ECG  Sinus rhythm with LBBB  Echocardiogram 06/10/2014  LV  EF: 55% - 60%  ------------------------------------------------------------------- Indications: Edema 782.3.  ------------------------------------------------------------------- History: PMH: Coronary artery disease. Chronic obstructive pulmonary disease. Risk factors: Morbid obesity. Hypertension. Diabetes mellitus. Dyslipidemia.  ------------------------------------------------------------------- Study Conclusions  - Left ventricle: The cavity size was normal. Systolic function was normal. The estimated ejection fraction was in the range of 55% to 60%. Wall motion was normal; there were no regional wall motion abnormalities. Features are consistent with a pseudonormal left ventricular filling pattern, with concomitant abnormal relaxation and increased filling pressure (grade 2 diastolic dysfunction). Doppler parameters are consistent with elevated ventricular end-diastolic filling pressure. - Aortic  valve: Trileaflet; normal thickness leaflets. There was no regurgitation. - Mitral valve: Calcified annulus. Severely thickened, moderately calcified leaflets . The findings are consistent with moderate stenosis. Mean gradient (D): 7 mm Hg. - Left atrium: The atrium was mildly dilated. - Right ventricle: Systolic function was normal. - Tricuspid valve: There was mild regurgitation. - Pulmonary arteries: Systolic pressure was severely increased. PA peak pressure: 77 mm Hg (S).  Impressions:  - Normal biventricular size and systolic function. Grade 2 diastolic dysfunction with elevated filling pressures. Moderate mitral stenosis. Severe pulmonary hypertension, RVSP 77 mmHg.     Radiology/Studies  Ct Abdomen Pelvis Wo Contrast  06/10/2014   CLINICAL DATA:  Urinary retention. 50 lb weight gain over the last month. Seeping wounds from the abdominal area.  EXAM: CT ABDOMEN AND PELVIS WITHOUT CONTRAST  TECHNIQUE: Multidetector CT imaging of the abdomen and pelvis was performed following the standard protocol without IV contrast.  COMPARISON:  11/16/2006  FINDINGS: Technically limited study due to patient's body habitus.  Small right pleural effusion. Calcified granuloma in the left lung base.  Diffuse edema throughout the subcutaneous fat of the abdomen and pelvis. Free fluid in the abdomen and pelvis probably representing ascites.  The unenhanced appearance of the liver, spleen, pancreas, adrenal glands, inferior vena cava, and retroperitoneal lymph nodes is unremarkable. Calcifications in the renal hila bilaterally probably represent vascular calcifications. Extensive calcification of the abdominal aorta without aneurysm. Stomach, small bowel, and colon are not abnormally distended. Contrast material flows through to the colon without evidence of obstruction. No free air in the abdomen.  Pelvis: No pelvic mass or lymphadenopathy. Appendix is not identified. No significant lymphadenopathy in the  pelvis. Bladder is decompressed with a Foley catheter. Degenerative changes in the lumbar spine. No destructive bone lesions appreciated.  IMPRESSION: Technically limited study due to patient's body habitus. There is extensive edema throughout the subcutaneous fat of the abdomen. Small right pleural effusion. Moderate abdominal/pelvic ascites.   Electronically Signed   By: Burman Nieves M.D.   On: 06/10/2014 02:01   Dg Chest 1 View  06/09/2014   CLINICAL DATA:  Leg swelling with weight gain.  History of stroke.  EXAM: CHEST - 1 VIEW  COMPARISON:  10/13/2013 and 09/25/2013.  FINDINGS: Examination is limited by body habitus. 1855 hr. Cardiomegaly and chronic vascular congestion are stable. There is no confluent airspace opacity, pleural effusion or pneumothorax. No acute osseous findings are seen.  IMPRESSION: Cardiomegaly with mild vascular congestion.   Electronically Signed   By: Roxy Horseman M.D.   On: 06/09/2014 19:04   US Renal  06/09/2014   CLINICAL DATA:  Acute kidney injury. Weight gain of 50 lb in 30 days.  EXAM: RENAL/URINARY TRACT ULTRASOUND COMPLETE  COMPARISON:  CT abdomen and pelvis 11/16/2006  FINDINGS: Examination is technically limited due to the patient's body habitus and diffuse edema.  Right Kidney:  Right kidney  is not visualized.  Left Kidney:  Left kidney is not visualized.  Bladder:  Bladder is not visualized.  IMPRESSION: Kidneys and bladder are not visualized due to patient's body habitus and diffuse edema. Nondiagnostic study.   Electronically Signed   By: Burman NievesWilliam  Stevens M.D.   On: 06/09/2014 23:59   [place] Abdomen Limited  06/10/2014   CLINICAL DATA:  Abdominal distention.  EXAM: LIMITED ABDOMEN ULTRASOUND FOR ASCITES  TECHNIQUE: Limited ultrasound survey for ascites was performed in all four abdominal quadrants.  COMPARISON:  None.  FINDINGS: Images obtained of all 4 quadrants. Examination is technically limited due to body habitus and edema. No ascites is identified. However,  the diagnostic quality of the study is limited in ascites is not entirely excluded based on this study.  IMPRESSION: Technically limited study. No ascites identified but due to technical limitations cannot be excluded.   Electronically Signed   By: Burman NievesWilliam  Stevens M.D.   On: 06/10/2014 00:05    ASSESSMENT AND PLAN  1. Anasarca  - I suspect this is related to hypothyroidism   - no DVT seen on U/S  - Cr stable, continue IV diuresis, -5.6L/-12 lbs so far. 2. Acute on chronic diastolic HF   - Echo 06/10/2014 EF 55-60%, grade 2 diastolic HF, moderate MS, PA peak pressure 3177mmHgc/ severe pulmonary HTN although right sided chambers are normal in size.   3. Acute on chronic renal insufficiency  - possible postrenal obstruction, noted 1100 cc output after foley without diuretic  - nephrology following, baseline Cr 1.6. Renal US unable to see kidney 2/2 body habitus.  4. Chronic anemia 5. Hyponatremia - improving with diuresis 6. Hypothyroidism: elevated TSH, no prior history. Management per primary team  - low free T3 and free T4.  Started on Synthroid 7. DM 8. Chronic LBBB 9. Chronic bilateral LE cellulitis   Signed, Azalee CourseMeng, Hao PA-C Pager: [phone]\  Patient seen and personally examined and chart reviewed.  Agree with note by Azalee CourseHao Meng with minor changes. Has evidence of CHF most likely from hypothyroidism as well as chronic diastolic CHF in setting of moderate MS.  She also has severe pulmonary HTN and once renal function normalizes this needs to be addressed further with right and left heart cath.  Continue IV diuretics for now.

## 2014-06-11 NOTE — Progress Notes (Signed)
[facility] ROUNDING NOTE   Subjective:   Interval History: constipated and miserable this morning  Objective:  Vital signs in last 24 hours:  Temp:  [97.6 F (36.4 C)-98.3 F (36.8 C)] 98.2 F (36.8 C) (10/01 0530) Pulse Rate:  [83-90] 90 (10/01 0530) Resp:  [16-18] 18 (10/01 0530) BP: (116-145)/(47-55) 116/50 mmHg (10/01 0950) SpO2:  [94 %-98 %] 96 % (10/01 1051) Weight:  [150.231 kg (331 lb 3.2 oz)] 150.231 kg (331 lb 3.2 oz) (10/01 0530)  Weight change: -5.608 kg (-12 lb 5.8 oz) Filed Weights   06/09/14 1827 06/10/14 0251 06/11/14 0530  Weight: 155.839 kg (343 lb 9 oz) 153.225 kg (337 lb 12.8 oz) 150.231 kg (331 lb 3.2 oz)    Intake/Output: I/O last 3 completed shifts: In: 690 [P.O.:630; I.V.:60] Out: 5600 [Urine:5600]   Intake/Output this shift:  Total I/O In: -  Out: 775 [Urine:775]  General: Morbidly obese female, awake, alert, cooperative, NAD. HEENT: PERRL, EOMI. Moist mucus membranes Neck: Full range of motion without pain, supple, no lymphadenopathy or carotid bruits. Left-sided scar present (previous endarterectomy).  Lungs: Air entry equal bilaterally, normal work of respiration. Scattered wheezes, mild bibasilar crackles. On 2L O2 via [place].  Heart: RRR, no murmurs, gallops, or rubs Abdomen: Obese, tense, woody pitting edema extending to mid-back. Distended. Distant bowel sounds present. Foley catheter in place.  Extremities: No cyanosis. +4 pitting edema (also w/ some underlying non-pitting edema?). Significant erythema and venous stasis changes on LE's bilaterally. No tenderness on examination.  Neurologic: Alert & oriented X3, cranial nerves II-XII intact, strength grossly intact, sensation intact to light touch    Basic Metabolic Panel:  Recent Labs Lab 06/09/14 1818 06/10/14 0400 06/11/14 0502  NA 128* 129* 134*  K 4.3 3.7 4.2  CL 86* 87* 91*  CO2 31 31 31   GLUCOSE 92 80 124*  BUN 57* 57* 56*  CREATININE 2.27* 2.19* 2.06*   CALCIUM 9.1 8.9 8.8    Liver Function Tests:  Recent Labs Lab 06/09/14 1818 06/10/14 0400 06/11/14 0502  AST 15 17 13   ALT 11 10 8   ALKPHOS 159* 149* 133*  BILITOT 0.6 0.7 1.0  PROT 8.0 7.6 7.1  ALBUMIN 3.1* 2.9* 2.7*   No results found for this basename: LIPASE, AMYLASE,  in the last 168 hours No results found for this basename: AMMONIA,  in the last 168 hours  CBC:  Recent Labs Lab 06/09/14 1818 06/10/14 0400 06/11/14 0502  WBC 9.4 9.2 8.4  NEUTROABS 7.9* 7.4 6.9  HGB 11.5* 10.7* 10.0*  HCT 34.4* 32.3* 30.3*  MCV 88.0 87.1 90.4  PLT 237 224 210    Cardiac Enzymes:  Recent Labs Lab 06/09/14 2310 06/10/14 0400 06/10/14 1035  TROPONINI <0.30 <0.30 <0.30    BNP: No components found with this basename: POCBNP,   CBG:  Recent Labs Lab 06/10/14 0843 06/10/14 1313 06/10/14 1635 06/10/14 2216 06/11/14 0606  GLUCAP 74 100* 113* 96 129*    Microbiology: Results for orders placed during the hospital encounter of 09/10/13  CULTURE, BLOOD (ROUTINE X 2)     Status: None   Collection Time    09/10/13  6:15 PM      Result Value Ref Range Status   Specimen Description BLOOD RIGHT HAND   Final   Special Requests BOTTLES DRAWN AEROBIC ONLY 5CC   Final   Culture  Setup Time     Final   Value: 09/11/2013 02:45     Performed at [facility]   Culture     Final   Value: NO GROWTH 5 DAYS     Performed at [facility]   Report Status 09/17/2013 FINAL   Final  CULTURE, BLOOD (ROUTINE X 2)     Status: None   Collection Time    09/10/13  6:27 PM      Result Value Ref Range Status   Specimen Description BLOOD LEFT HAND   Final   Special Requests BOTTLES DRAWN AEROBIC ONLY 10CC   Final   Culture  Setup Time     Final   Value: 09/11/2013 02:45     Performed at [facility]   Culture     Final   Value: NO GROWTH 5 DAYS     Performed at [facility]   Report Status 09/17/2013 FINAL   Final    Coagulation Studies: No results  found for this basename: LABPROT, INR,  in the last 72 hours  Urinalysis:  Recent Labs  06/10/14 0002  COLORURINE YELLOW  LABSPEC 1.006  PHURINE 5.0  GLUCOSEU NEGATIVE  HGBUR NEGATIVE  BILIRUBINUR NEGATIVE  KETONESUR NEGATIVE  PROTEINUR NEGATIVE  UROBILINOGEN 0.2  NITRITE NEGATIVE  LEUKOCYTESUR NEGATIVE      Imaging: Ct Abdomen Pelvis Wo Contrast  06/10/2014   CLINICAL DATA:  Urinary retention. 50 lb weight gain over the last month. Seeping wounds from the abdominal area.  EXAM: CT ABDOMEN AND PELVIS WITHOUT CONTRAST  TECHNIQUE: Multidetector CT imaging of the abdomen and pelvis was performed following the standard protocol without IV contrast.  COMPARISON:  11/16/2006  FINDINGS: Technically limited study due to patient's body habitus.  Small right pleural effusion. Calcified granuloma in the left lung base.  Diffuse edema throughout the subcutaneous fat of the abdomen and pelvis. Free fluid in the abdomen and pelvis probably representing ascites.  The unenhanced appearance of the liver, spleen, pancreas, adrenal glands, inferior vena cava, and retroperitoneal lymph nodes is unremarkable. Calcifications in the renal hila bilaterally probably represent vascular calcifications. Extensive calcification of the abdominal aorta without aneurysm. Stomach, small bowel, and colon are not abnormally distended. Contrast material flows through to the colon without evidence of obstruction. No free air in the abdomen.  Pelvis: No pelvic mass or lymphadenopathy. Appendix is not identified. No significant lymphadenopathy in the pelvis. Bladder is decompressed with a Foley catheter. Degenerative changes in the lumbar spine. No destructive bone lesions appreciated.  IMPRESSION: Technically limited study due to patient's body habitus. There is extensive edema throughout the subcutaneous fat of the abdomen. Small right pleural effusion. Moderate abdominal/pelvic ascites.   Electronically Signed   By: Burman Nieves M.D.   On: 06/10/2014 02:01   Dg Chest 1 View  06/09/2014   CLINICAL DATA:  Leg swelling with weight gain.  History of stroke.  EXAM: CHEST - 1 VIEW  COMPARISON:  10/13/2013 and 09/25/2013.  FINDINGS: Examination is limited by body habitus. 1855 hr. Cardiomegaly and chronic vascular congestion are stable. There is no confluent airspace opacity, pleural effusion or pneumothorax. No acute osseous findings are seen.  IMPRESSION: Cardiomegaly with mild vascular congestion.   Electronically Signed   By: Roxy Horseman M.D.   On: 06/09/2014 19:04   US Renal  06/09/2014   CLINICAL DATA:  Acute kidney injury. Weight gain of 50 lb in 30 days.  EXAM: RENAL/URINARY TRACT ULTRASOUND COMPLETE  COMPARISON:  CT abdomen and pelvis 11/16/2006  FINDINGS: Examination is technically limited due to the patient's body habitus and  diffuse edema.  Right Kidney:  Right kidney is not visualized.  Left Kidney:  Left kidney is not visualized.  Bladder:  Bladder is not visualized.  IMPRESSION: Kidneys and bladder are not visualized due to patient's body habitus and diffuse edema. Nondiagnostic study.   Electronically Signed   By: Burman NievesWilliam  Stevens M.D.   On: 06/09/2014 23:59   [place] Abdomen Limited  06/10/2014   CLINICAL DATA:  Abdominal distention.  EXAM: LIMITED ABDOMEN ULTRASOUND FOR ASCITES  TECHNIQUE: Limited ultrasound survey for ascites was performed in all four abdominal quadrants.  COMPARISON:  None.  FINDINGS: Images obtained of all 4 quadrants. Examination is technically limited due to body habitus and edema. No ascites is identified. However, the diagnostic quality of the study is limited in ascites is not entirely excluded based on this study.  IMPRESSION: Technically limited study. No ascites identified but due to technical limitations cannot be excluded.   Electronically Signed   By: Burman NievesWilliam  Stevens M.D.   On: 06/10/2014 00:05     Medications:     . cloNIDine  0.1 mg Oral BID  . furosemide  80 mg Intravenous  Q12H  . heparin  5,000 Units Subcutaneous 3 times per day  . insulin aspart  0-5 Units Subcutaneous QHS  . insulin aspart  0-9 Units Subcutaneous TID WC  . levothyroxine  25 mcg Oral QAC breakfast  . pregabalin  75 mg Oral QPM   sorbitol  Assessment/ Plan:  58 y/o F w/ multiple co-morbidities, admitted on 06/09/14 w/ volume overload, likely 2/2 decompensated dCHF and new onset hypothyroidism. Also found to have acute on chronic renal failure and possible urinary obstruction.   Volume Overload- Previous ECHO from 09/2013 shows EF of 55% w/ grade 2 diastolic dysfunction. Had previously been taking lasix 40 mg po qd at home, increased to 160 mg bid by her PCP w/ no improvement in volume status or diuresis. Has gained ~70 lbs since 11/2013 according to recent weights. Patient w/ severe anasarca on exam. Has received 40 IV lasix in ED, now on 80 mg IV BID. Likely contributed to by recent discovery of hypothyroidism, TSH 18.3. No previous thyroid disease. Likely causing decreased cardiac output contributing to volume status. Agree w/ current diuretic regimen. Strict I/O's, daily weights.  Acute on CKD stage 3- Baseline Cr ~1.6 according to previous labs, 2.27 on admission. UA w/out protein. CKD likely 2/2 DM vs uncontrolled HTN in the past. Acute injury associated w/ volume overload vs possible bladder dysfunction; patient states she has been having significant urinary retention. Renal US not able to see kidneys 2/2 body habitus. Urine output ~2L in the past 24 hours. Cr improved. Continue Lasix as above. Will need outpatient renal follow up on discharge.  Hypothyroidism- TSH 18.36 on admission. No previous h/o hypothyroidism. Also admits to recent fatigue, dry skin, cold intolerance, and weight gain. Decrease in metabolism likely resulting in poor cardiac output, contributing somewhat to vole status. T3, fT4 pending. Started on Synthroid 25 mcg qAM.  Hyponatremia- 128 on admission, 2/2 volume overload vs  hypothyroidism. Continue to monitor.    Appears to be diuresing negative 3.5 L  Morbid obesity and hypothyroidims. Suggest continued diuresis at current dose   LOS: 2 Benson Porcaro W @TODAY @11 :22 AM

## 2014-06-11 NOTE — Progress Notes (Signed)
[facility] PROGRESS NOTE  Assessment/Plan: Anasarca : - Likely secondary to diastolic dysfunction.new onset hypothyroidism and acute on chronic renal failure.  - TSH was elevated at 18.360.  free T4 and T3 low. - 2-D echo has been obtained that shows grade 2 diastolic dysfunction with EF of 55-60% with no wall motion abnormalities. - Renal function trending down. Continue IV Lasix. Will start patient on Synthroid. Follow.   Acute on chronic diastolic CHF exacerbation  - Cardiac enzymes negative x3. 2-D echo with a EF of 55-60% with no wall motion abnormalities. - I/O equal -3.4 L Current weight is 150 kg. Unknown dry weight - Continue IV Lasix regimen.  - Started Synthroid. Cardiology following and appreciate input and recommendations.   Acute on chronic kidney disease stage III: - baseline Cr 1.6, slowly trending down urine still clear. - Secondary to volume control diabetes and hypertension.  - Acute component due to vol overload. Renal U/S no good windows - Continue IV diuretics. Nephrology following and follow up as an outpatient.  New onset hypothyroidism: - TSH on admission was 18.36. No prior history of hypothyroidism.  - start synthroid.  Hyponatremia  - Likely secondary to a hypervolemic hyponatremia - Continue IV Lasix and follow sodium levels.   Chronic respiratory failure/COPD  Stable. Follow.   Diabetes mellitus  -Low due to acute on chronic kidney disease.  - now trending up start low dose Lantus.  prophylaxis  Heparin for DVT prophylaxis.  Code Status: Full  Family Communication: Updated patient no family at bedside.  Disposition Plan: Home when medically stable.    Consultants:  none  Procedures:  Renal U/s  Antibiotics:  None  HPI/Subjective: No complains  Objective: Filed Vitals:   06/10/14 2220 06/11/14 0530 06/11/14 0950 06/11/14 1051  BP: 131/47 128/55 116/50   Pulse: 88 90    Temp: 98.3 F (36.8 C) 98.2 F (36.8 C)      TempSrc: Oral Oral    Resp: 18 18    Height:      Weight:  150.231 kg (331 lb 3.2 oz)    SpO2: 97% 94%  96%    Intake/Output Summary (Last 24 hours) at 06/11/14 1231 Last data filed at 06/11/14 1027  Gross per 24 hour  Intake    390 ml  Output   3650 ml  Net  -3260 ml   Filed Weights   06/09/14 1827 06/10/14 0251 06/11/14 0530  Weight: 155.839 kg (343 lb 9 oz) 153.225 kg (337 lb 12.8 oz) 150.231 kg (331 lb 3.2 oz)    Exam:  General: Alert, awake, oriented x3, in no acute distress.  HEENT: No bruits, no goiter. +JVD Heart: Regular rate and rhythm. Lungs: Good air movement, clear Abdomen: Soft, nontender, nondistended, positive bowel sounds.   Data Reviewed: Basic Metabolic Panel:  Recent Labs Lab 06/09/14 1818 06/10/14 0400 06/11/14 0502  NA 128* 129* 134*  K 4.3 3.7 4.2  CL 86* 87* 91*  CO2 31 31 31   GLUCOSE 92 80 124*  BUN 57* 57* 56*  CREATININE 2.27* 2.19* 2.06*  CALCIUM 9.1 8.9 8.8   Liver Function Tests:  Recent Labs Lab 06/09/14 1818 06/10/14 0400 06/11/14 0502  AST 15 17 13   ALT 11 10 8   ALKPHOS 159* 149* 133*  BILITOT 0.6 0.7 1.0  PROT 8.0 7.6 7.1  ALBUMIN 3.1* 2.9* 2.7*   No results found for this basename: LIPASE, AMYLASE,  in the last 168 hours No results found for this basename:  AMMONIA,  in the last 168 hours CBC:  Recent Labs Lab 06/09/14 1818 06/10/14 0400 06/11/14 0502  WBC 9.4 9.2 8.4  NEUTROABS 7.9* 7.4 6.9  HGB 11.5* 10.7* 10.0*  HCT 34.4* 32.3* 30.3*  MCV 88.0 87.1 90.4  PLT 237 224 210   Cardiac Enzymes:  Recent Labs Lab 06/09/14 2310 06/10/14 0400 06/10/14 1035  TROPONINI <0.30 <0.30 <0.30   BNP (last 3 results)  Recent Labs  09/10/13 1827 10/13/13 1916 06/09/14 1818  PROBNP 4050.0* 1519.0* 4358.0*   CBG:  Recent Labs Lab 06/10/14 0843 06/10/14 1313 06/10/14 1635 06/10/14 2216 06/11/14 0606  GLUCAP 74 100* 113* 96 129*    No results found for this or any previous visit (from the past 240  hour(s)).   Studies: Ct Abdomen Pelvis Wo Contrast  06/10/2014   CLINICAL DATA:  Urinary retention. 50 lb weight gain over the last month. Seeping wounds from the abdominal area.  EXAM: CT ABDOMEN AND PELVIS WITHOUT CONTRAST  TECHNIQUE: Multidetector CT imaging of the abdomen and pelvis was performed following the standard protocol without IV contrast.  COMPARISON:  11/16/2006  FINDINGS: Technically limited study due to patient's body habitus.  Small right pleural effusion. Calcified granuloma in the left lung base.  Diffuse edema throughout the subcutaneous fat of the abdomen and pelvis. Free fluid in the abdomen and pelvis probably representing ascites.  The unenhanced appearance of the liver, spleen, pancreas, adrenal glands, inferior vena cava, and retroperitoneal lymph nodes is unremarkable. Calcifications in the renal hila bilaterally probably represent vascular calcifications. Extensive calcification of the abdominal aorta without aneurysm. Stomach, small bowel, and colon are not abnormally distended. Contrast material flows through to the colon without evidence of obstruction. No free air in the abdomen.  Pelvis: No pelvic mass or lymphadenopathy. Appendix is not identified. No significant lymphadenopathy in the pelvis. Bladder is decompressed with a Foley catheter. Degenerative changes in the lumbar spine. No destructive bone lesions appreciated.  IMPRESSION: Technically limited study due to patient's body habitus. There is extensive edema throughout the subcutaneous fat of the abdomen. Small right pleural effusion. Moderate abdominal/pelvic ascites.   Electronically Signed   By: Burman Nieves M.D.   On: 06/10/2014 02:01   Dg Chest 1 View  06/09/2014   CLINICAL DATA:  Leg swelling with weight gain.  History of stroke.  EXAM: CHEST - 1 VIEW  COMPARISON:  10/13/2013 and 09/25/2013.  FINDINGS: Examination is limited by body habitus. 1855 hr. Cardiomegaly and chronic vascular congestion are stable.  There is no confluent airspace opacity, pleural effusion or pneumothorax. No acute osseous findings are seen.  IMPRESSION: Cardiomegaly with mild vascular congestion.   Electronically Signed   By: Roxy Horseman M.D.   On: 06/09/2014 19:04   US Renal  06/09/2014   CLINICAL DATA:  Acute kidney injury. Weight gain of 50 lb in 30 days.  EXAM: RENAL/URINARY TRACT ULTRASOUND COMPLETE  COMPARISON:  CT abdomen and pelvis 11/16/2006  FINDINGS: Examination is technically limited due to the patient's body habitus and diffuse edema.  Right Kidney:  Right kidney is not visualized.  Left Kidney:  Left kidney is not visualized.  Bladder:  Bladder is not visualized.  IMPRESSION: Kidneys and bladder are not visualized due to patient's body habitus and diffuse edema. Nondiagnostic study.   Electronically Signed   By: Burman Nieves M.D.   On: 06/09/2014 23:59   US Abdomen Limited  06/10/2014   CLINICAL DATA:  Abdominal distention.  EXAM: LIMITED ABDOMEN ULTRASOUND FOR  ASCITES  TECHNIQUE: Limited ultrasound survey for ascites was performed in all four abdominal quadrants.  COMPARISON:  None.  FINDINGS: Images obtained of all 4 quadrants. Examination is technically limited due to body habitus and edema. No ascites is identified. However, the diagnostic quality of the study is limited in ascites is not entirely excluded based on this study.  IMPRESSION: Technically limited study. No ascites identified but due to technical limitations cannot be excluded.   Electronically Signed   By: Burman Nieves M.D.   On: 06/10/2014 00:05    Scheduled Meds: . cloNIDine  0.1 mg Oral BID  . furosemide  80 mg Intravenous Q12H  . heparin  5,000 Units Subcutaneous 3 times per day  . insulin aspart  0-5 Units Subcutaneous QHS  . insulin aspart  0-9 Units Subcutaneous TID WC  . levothyroxine  25 mcg Oral QAC breakfast  . pregabalin  75 mg Oral QPM   Continuous Infusions:    Marinda Elk  [facility] Pager [phone].  If 8PM-8AM, please contact night-coverage at [email or web address], password [facility] 06/11/2014, 12:31 PM  LOS: 2 days

## 2014-06-12 DIAGNOSIS — I10 Essential (primary) hypertension: Secondary | ICD-10-CM

## 2014-06-12 LAB — CBC WITH DIFFERENTIAL/PLATELET
BASOS PCT: 0 % (ref 0–1)
Basophils Absolute: 0 10*3/uL (ref 0.0–0.1)
EOS ABS: 0 10*3/uL (ref 0.0–0.7)
EOS PCT: 0 % (ref 0–5)
HEMATOCRIT: 34.8 % — AB (ref 36.0–46.0)
Hemoglobin: 11.5 g/dL — ABNORMAL LOW (ref 12.0–15.0)
Lymphocytes Relative: 2 % — ABNORMAL LOW (ref 12–46)
Lymphs Abs: 0.5 10*3/uL — ABNORMAL LOW (ref 0.7–4.0)
MCH: 29.6 pg (ref 26.0–34.0)
MCHC: 33 g/dL (ref 30.0–36.0)
MCV: 89.5 fL (ref 78.0–100.0)
Monocytes Absolute: 0.1 10*3/uL (ref 0.1–1.0)
Monocytes Relative: 1 % — ABNORMAL LOW (ref 3–12)
NEUTROS PCT: 97 % — AB (ref 43–77)
Neutro Abs: 19.8 10*3/uL — ABNORMAL HIGH (ref 1.7–7.7)
Platelets: 215 10*3/uL (ref 150–400)
RBC: 3.89 MIL/uL (ref 3.87–5.11)
RDW: 14.7 % (ref 11.5–15.5)
WBC: 20.4 10*3/uL — ABNORMAL HIGH (ref 4.0–10.5)

## 2014-06-12 LAB — GLUCOSE, CAPILLARY
Glucose-Capillary: 302 mg/dL — ABNORMAL HIGH (ref 70–99)
Glucose-Capillary: 393 mg/dL — ABNORMAL HIGH (ref 70–99)
Glucose-Capillary: 446 mg/dL — ABNORMAL HIGH (ref 70–99)
Glucose-Capillary: 492 mg/dL — ABNORMAL HIGH (ref 70–99)

## 2014-06-12 LAB — COMPREHENSIVE METABOLIC PANEL
ALK PHOS: 152 U/L — AB (ref 39–117)
ALT: 9 U/L (ref 0–35)
AST: 16 U/L (ref 0–37)
Albumin: 3 g/dL — ABNORMAL LOW (ref 3.5–5.2)
Anion gap: 19 — ABNORMAL HIGH (ref 5–15)
BUN: 58 mg/dL — ABNORMAL HIGH (ref 6–23)
CO2: 28 meq/L (ref 19–32)
Calcium: 9.5 mg/dL (ref 8.4–10.5)
Chloride: 85 mEq/L — ABNORMAL LOW (ref 96–112)
Creatinine, Ser: 2 mg/dL — ABNORMAL HIGH (ref 0.50–1.10)
GFR calc non Af Amer: 26 mL/min — ABNORMAL LOW (ref >90)
GFR, EST AFRICAN AMERICAN: 30 mL/min — AB (ref >90)
GLUCOSE: 313 mg/dL — AB (ref 70–99)
POTASSIUM: 3.7 meq/L (ref 3.7–5.3)
Sodium: 132 mEq/L — ABNORMAL LOW (ref 137–147)
TOTAL PROTEIN: 8.1 g/dL (ref 6.0–8.3)
Total Bilirubin: 1.2 mg/dL (ref 0.3–1.2)

## 2014-06-12 MED ORDER — METOLAZONE 2.5 MG PO TABS
2.5000 mg | ORAL_TABLET | Freq: Once | ORAL | Status: AC
  Administered 2014-06-12: 2.5 mg via ORAL
  Filled 2014-06-12: qty 1

## 2014-06-12 MED ORDER — INSULIN GLARGINE 100 UNIT/ML ~~LOC~~ SOLN
55.0000 [IU] | Freq: Two times a day (BID) | SUBCUTANEOUS | Status: DC
  Administered 2014-06-12 – 2014-06-13 (×2): 55 [IU] via SUBCUTANEOUS
  Filled 2014-06-12 (×3): qty 0.55

## 2014-06-12 MED ORDER — INSULIN ASPART 100 UNIT/ML ~~LOC~~ SOLN
8.0000 [IU] | Freq: Once | SUBCUTANEOUS | Status: AC
  Administered 2014-06-12: 8 [IU] via SUBCUTANEOUS

## 2014-06-12 NOTE — Progress Notes (Signed)
[facility] ROUNDING NOTE   Subjective:   Interval History:diuresing well   Objective:  Vital signs in last 24 hours:  Temp:  [97.6 F (36.4 C)-99.4 F (37.4 C)] 97.6 F (36.4 C) (10/02 0649) Pulse Rate:  [84-109] 105 (10/02 1032) Resp:  [18-20] 20 (10/02 0649) BP: (94-170)/(55-95) 170/95 mmHg (10/02 1032) SpO2:  [94 %-99 %] 94 % (10/02 0913) Weight:  [147.737 kg (325 lb 11.2 oz)] 147.737 kg (325 lb 11.2 oz) (10/02 0404)  Weight change: -2.495 kg (-5 lb 8 oz) Filed Weights   06/10/14 0251 06/11/14 0530 06/12/14 0404  Weight: 153.225 kg (337 lb 12.8 oz) 150.231 kg (331 lb 3.2 oz) 147.737 kg (325 lb 11.2 oz)    Intake/Output: I/O last 3 completed shifts: In: 390 [P.O.:240; IV Piggyback:150] Out: 4795 [Urine:4795]   Intake/Output this shift:  Total I/O In: 120 [P.O.:120] Out: 600 [Urine:600]  General: Morbidly obese female, awake, alert, cooperative, NAD. HEENT: PERRL, EOMI. Moist mucus membranes Neck: Full range of motion without pain, supple, no lymphadenopathy or carotid bruits. Left-sided scar present (previous endarterectomy).  Lungs: Air entry equal bilaterally, normal work of respiration. Scattered wheezes, mild bibasilar crackles. On 2L O2 via [place].  Heart: RRR, no murmurs, gallops, or rubs Abdomen: Obese, tense, woody pitting edema extending to mid-back. Distended. Distant bowel sounds present. Foley catheter in place.  Extremities: No cyanosis. +4 pitting edema (also w/ some underlying non-pitting edema?). Significant erythema and venous stasis changes on LE's bilaterally. No tenderness on examination.  Neurologic: Alert & oriented X3, cranial nerves II-XII intact, strength grossly intact, sensation intact to light touch      Basic Metabolic Panel:  Recent Labs Lab 06/09/14 1818 06/10/14 0400 06/11/14 0502 06/12/14 0400  NA 128* 129* 134* 132*  K 4.3 3.7 4.2 3.7  CL 86* 87* 91* 85*  CO2 31 31 31 28   GLUCOSE 92 80 124* 313*  BUN 57* 57*  56* 58*  CREATININE 2.27* 2.19* 2.06* 2.00*  CALCIUM 9.1 8.9 8.8 9.5    Liver Function Tests:  Recent Labs Lab 06/09/14 1818 06/10/14 0400 06/11/14 0502 06/12/14 0400  AST 15 17 13 16   ALT 11 10 8 9   ALKPHOS 159* 149* 133* 152*  BILITOT 0.6 0.7 1.0 1.2  PROT 8.0 7.6 7.1 8.1  ALBUMIN 3.1* 2.9* 2.7* 3.0*   No results found for this basename: LIPASE, AMYLASE,  in the last 168 hours No results found for this basename: AMMONIA,  in the last 168 hours  CBC:  Recent Labs Lab 06/09/14 1818 06/10/14 0400 06/11/14 0502 06/12/14 0400  WBC 9.4 9.2 8.4 20.4*  NEUTROABS 7.9* 7.4 6.9 19.8*  HGB 11.5* 10.7* 10.0* 11.5*  HCT 34.4* 32.3* 30.3* 34.8*  MCV 88.0 87.1 90.4 89.5  PLT 237 224 210 215    Cardiac Enzymes:  Recent Labs Lab 06/09/14 2310 06/10/14 0400 06/10/14 1035  TROPONINI <0.30 <0.30 <0.30    BNP: No components found with this basename: POCBNP,   CBG:  Recent Labs Lab 06/11/14 1235 06/11/14 1615 06/11/14 2120 06/12/14 0543 06/12/14 1114  GLUCAP 196* 240* 273* 302* 393*    Microbiology: Results for orders placed during the hospital encounter of 09/10/13  CULTURE, BLOOD (ROUTINE X 2)     Status: None   Collection Time    09/10/13  6:15 PM      Result Value Ref Range Status   Specimen Description BLOOD RIGHT HAND   Final   Special Requests BOTTLES DRAWN AEROBIC ONLY 5CC  Final   Culture  Setup Time     Final   Value: 09/11/2013 02:45     Performed at [facility]   Culture     Final   Value: NO GROWTH 5 DAYS     Performed at [facility]   Report Status 09/17/2013 FINAL   Final  CULTURE, BLOOD (ROUTINE X 2)     Status: None   Collection Time    09/10/13  6:27 PM      Result Value Ref Range Status   Specimen Description BLOOD LEFT HAND   Final   Special Requests BOTTLES DRAWN AEROBIC ONLY 10CC   Final   Culture  Setup Time     Final   Value: 09/11/2013 02:45     Performed at [facility]   Culture     Final    Value: NO GROWTH 5 DAYS     Performed at [facility]   Report Status 09/17/2013 FINAL   Final    Coagulation Studies: No results found for this basename: LABPROT, INR,  in the last 72 hours  Urinalysis:  Recent Labs  06/10/14 0002  COLORURINE YELLOW  LABSPEC 1.006  PHURINE 5.0  GLUCOSEU NEGATIVE  HGBUR NEGATIVE  BILIRUBINUR NEGATIVE  KETONESUR NEGATIVE  PROTEINUR NEGATIVE  UROBILINOGEN 0.2  NITRITE NEGATIVE  LEUKOCYTESUR NEGATIVE      Imaging: No results found.   Medications:     . cloNIDine  0.1 mg Oral BID  . furosemide  80 mg Intravenous Q12H  . heparin  5,000 Units Subcutaneous 3 times per day  . insulin aspart  0-5 Units Subcutaneous QHS  . insulin aspart  0-9 Units Subcutaneous TID WC  . insulin glargine  5 Units Subcutaneous QHS  . levofloxacin (LEVAQUIN) IV  750 mg Intravenous Q48H  . levothyroxine  25 mcg Oral QAC breakfast  . metolazone  2.5 mg Oral Once  . pregabalin  75 mg Oral QPM  . tiotropium  18 mcg Inhalation Daily   albuterol, sorbitol  Assessment/ Plan:  58 y/o F w/ multiple co-morbidities, admitted on 06/09/14 w/ volume overload, likely 2/2 decompensated dCHF and new onset hypothyroidism. Also found to have acute on chronic renal failure and possible urinary obstruction.  Volume Overload- Previous ECHO from 09/2013 shows EF of 55% w/ grade 2 diastolic dysfunction. Had previously been taking lasix 40 mg po qd at home, increased to 160 mg bid by her PCP w/ no improvement in volume status or diuresis. Has gained ~70 lbs since 11/2013 according to recent weights. Patient w/ severe anasarca on exam. Has received 40 IV lasix in ED, now on 80 mg IV BID. Likely contributed to by recent discovery of hypothyroidism, TSH 18.3. No previous thyroid disease. Likely causing decreased cardiac output contributing to volume status. Agree w/ current diuretic regimen. Strict I/O's, daily weights.  Acute on CKD stage 3- Baseline Cr ~1.6 according to previous  labs, 2.27 on admission. UA w/out protein. CKD likely 2/2 DM vs uncontrolled HTN in the past. Acute injury associated w/ volume overload vs possible bladder dysfunction; patient states she has been having significant urinary retention. Renal US not able to see kidneys 2/2 body habitus. Cr improved. Continue Lasix as above. Will need outpatient renal follow up on discharge.  Hypothyroidism- TSH 18.36 on admission. No previous h/o hypothyroidism. Also admits to recent fatigue, dry skin, cold intolerance, and weight gain. Decrease in metabolism likely resulting in poor cardiac output, contributing somewhat to vole status.  T3, fT4 pending. Started on Synthroid 25 mcg qAM.  Hyponatremia- 128 on admission, 2/2 volume overload vs hypothyroidism. Continue to monitor.    Appears to be diuresing negative 3.5 L Morbid obesity and hypothyroidims. Suggest continued diuresis    LOS: 3 Robbie Rideaux W @TODAY @12 :18 PM

## 2014-06-12 NOTE — Progress Notes (Signed)
[facility] PROGRESS NOTE  Assessment/Plan: Anasarca : - Likely secondary to diastolic dysfunction  & probable new onset hypothyroidism and acute on chronic renal failure.  - TSH was elevated at 18.360.  free T4 and T3 low.   Acute on chronic diastolic CHF exacerbation  - Cardiac enzymes negative x3. 2-D echo with a EF of 55-60% with no wall motion abnormalities. - I/O equal -3.4 L Current weight is 150 kg. Unknown dry weight - Continue IV Lasix regimen at current dose. - Started Synthroid. Cardiology following and appreciate input and recommendations.   Acute on chronic kidney disease stage III: - baseline Cr 1.6, slowly trending down urine still clear. - Chronic component, Secondary to volume control diabetes and hypertension.  - Acute component due to vol overload. Renal U/S no good windows - Continue IV diuretics. Nephrology following and follow up as an outpatient.  New onset hypothyroidism: - TSH on admission was 18.36. No prior history of hypothyroidism.  - start synthroid.  Hyponatremia  - Likely secondary to a hypervolemic hyponatremia - Continue IV Lasix and follow sodium levels.   Chronic respiratory failure/COPD  Stable. Follow.   Diabetes mellitus  -Low due to acute on chronic kidney disease.  - now trending up start low dose Lantus.  prophylaxis  Heparin for DVT prophylaxis.  Code Status: Full  Family Communication: Updated patient no family at bedside.  Disposition Plan: Home when medically stable.    Consultants:  none  Procedures:  Renal U/s - 2-D echo has been obtained that shows grade 2 diastolic dysfunction with EF of 55-60% with no wall motion abnormalities. - Renal function trending down.   Antibiotics:  None  HPI/Subjective: No complains  Objective: Filed Vitals:   06/12/14 0404 06/12/14 0649 06/12/14 0913 06/12/14 1032  BP:  129/72  170/95  Pulse:  84  105  Temp:  97.6 F (36.4 C)    TempSrc:  Oral    Resp:  20      Height:      Weight: 147.737 kg (325 lb 11.2 oz)     SpO2:  95% 94%     Intake/Output Summary (Last 24 hours) at 06/12/14 1055 Last data filed at 06/12/14 1010  Gross per 24 hour  Intake    270 ml  Output   2720 ml  Net  -2450 ml   Filed Weights   06/10/14 0251 06/11/14 0530 06/12/14 0404  Weight: 153.225 kg (337 lb 12.8 oz) 150.231 kg (331 lb 3.2 oz) 147.737 kg (325 lb 11.2 oz)    Exam:  General: Alert, awake, oriented x3, in no acute distress.  HEENT: No bruits, no goiter.  Heart: Regular rate and rhythm. Lungs: Good air movement, clear Abdomen: Soft, nontender, nondistended, positive bowel sounds.   Data Reviewed: Basic Metabolic Panel:  Recent Labs Lab 06/09/14 1818 06/10/14 0400 06/11/14 0502 06/12/14 0400  NA 128* 129* 134* 132*  K 4.3 3.7 4.2 3.7  CL 86* 87* 91* 85*  CO2 31 31 31 28   GLUCOSE 92 80 124* 313*  BUN 57* 57* 56* 58*  CREATININE 2.27* 2.19* 2.06* 2.00*  CALCIUM 9.1 8.9 8.8 9.5   Liver Function Tests:  Recent Labs Lab 06/09/14 1818 06/10/14 0400 06/11/14 0502 06/12/14 0400  AST 15 17 13 16   ALT 11 10 8 9   ALKPHOS 159* 149* 133* 152*  BILITOT 0.6 0.7 1.0 1.2  PROT 8.0 7.6 7.1 8.1  ALBUMIN 3.1* 2.9* 2.7* 3.0*   No results found for this  basename: LIPASE, AMYLASE,  in the last 168 hours No results found for this basename: AMMONIA,  in the last 168 hours CBC:  Recent Labs Lab 06/09/14 1818 06/10/14 0400 06/11/14 0502 06/12/14 0400  WBC 9.4 9.2 8.4 20.4*  NEUTROABS 7.9* 7.4 6.9 19.8*  HGB 11.5* 10.7* 10.0* 11.5*  HCT 34.4* 32.3* 30.3* 34.8*  MCV 88.0 87.1 90.4 89.5  PLT 237 224 210 215   Cardiac Enzymes:  Recent Labs Lab 06/09/14 2310 06/10/14 0400 06/10/14 1035  TROPONINI <0.30 <0.30 <0.30   BNP (last 3 results)  Recent Labs  09/10/13 1827 10/13/13 1916 06/09/14 1818  PROBNP 4050.0* 1519.0* 4358.0*   CBG:  Recent Labs Lab 06/11/14 0606 06/11/14 1235 06/11/14 1615 06/11/14 2120 06/12/14 0543  GLUCAP  129* 196* 240* 273* 302*    No results found for this or any previous visit (from the past 240 hour(s)).   Studies: No results found.  Scheduled Meds: . cloNIDine  0.1 mg Oral BID  . furosemide  80 mg Intravenous Q12H  . heparin  5,000 Units Subcutaneous 3 times per day  . insulin aspart  0-5 Units Subcutaneous QHS  . insulin aspart  0-9 Units Subcutaneous TID WC  . insulin glargine  5 Units Subcutaneous QHS  . levofloxacin (LEVAQUIN) IV  750 mg Intravenous Q48H  . levothyroxine  25 mcg Oral QAC breakfast  . pregabalin  75 mg Oral QPM  . tiotropium  18 mcg Inhalation Daily   Continuous Infusions:    Marinda Elk  [facility] Pager [phone]. If 8PM-8AM, please contact night-coverage at [email or web address], password [facility] 06/12/2014, 10:55 AM  LOS: 3 days

## 2014-06-12 NOTE — Progress Notes (Addendum)
I have personally seen and examined patient and agree with note as outlined by Azalee CourseHao Meng, PA-C.  Will continue IV diuretics until renal function starts to bump (creatinine currently improving with diuresis).  She has at least moderate mitral stenosis and now with severe pulmonary HTN.  The pulmonary HTN is probably multifactorial from morbid obesity with obesity/hypoventilation syndrome, probable OSA, diastolic CHF and COPD as well as MS.  Not sure heart cath would have any benefit at this time since this is unlikely primary pulmonary HTN.  Would recommend outpt sleep study to evaluate for OSA.

## 2014-06-12 NOTE — Progress Notes (Signed)
Pts. HS Blood glucose 492. On call MD paged. RN will continue to monitor pt. Shelsie Tijerino, Cheryll DessertKaren Cherrell

## 2014-06-12 NOTE — Evaluation (Signed)
Physical Therapy Evaluation Patient Details Name: Kimberly ManilaSusan K Rumsey MRN: [id number] DOB: 24-Jul-1956 Today's Date: 06/12/2014   History of Present Illness  pt presents with Anasarca and SOB.    Clinical Impression  Pt very debilitated and fatigues quickly.  Pt requires A for all aspects of mobility and indicates her husband works outside the home during the day.  Spoke with pt about need for further rehab at SNF to maximize independence prior to returning to home and pt is open to this, however is concerned about insurance coverage.  Pt may decline SNF and need HHPT, OT, RN, and Aide if returns to home.  Will continue to follow.      Follow Up Recommendations SNF    Equipment Recommendations  None recommended by PT    Recommendations for Other Services       Precautions / Restrictions Precautions Precautions: Fall Precaution Comments: O2 depednednt at home.   Restrictions Weight Bearing Restrictions: No      Mobility  Bed Mobility Overal bed mobility: Needs Assistance Bed Mobility: Supine to Sit     Supine to sit: Min assist;HOB elevated     General bed mobility comments: cues for pt to try to do as much as she can on her own.  pt utilized bed rail and needed A with bring trunk up to sitting and bringing hips to EOB.    Transfers Overall transfer level: Needs assistance Equipment used: Rolling walker (2 wheeled) Transfers: Sit to/from [employer] CorporationStand;Stand Pivot Transfers Sit to Stand: Min assist Stand pivot transfers: Min assist       General transfer comment: cues for UE sue and needs A to block L LE from sliding forward when coming to standing.  pt fatigues very quickly and can only maintain standing ~1-2 mins.    Ambulation/Gait                Stairs            Wheelchair Mobility    Modified Rankin (Stroke Patients Only)       Balance Overall balance assessment: Needs assistance;History of Falls Sitting-balance support: Single extremity supported;Feet  supported Sitting balance-Leahy Scale: Poor     Standing balance support: Bilateral upper extremity supported Standing balance-Leahy Scale: Poor                               Pertinent Vitals/Pain Pain Assessment: No/denies pain    Home Living Family/patient expects to be discharged to:: Private residence Living Arrangements: Spouse/significant other Available Help at Discharge: Family;Available PRN/intermittently Type of Home: House Home Access: Ramped entrance     Home Layout: One level Home Equipment: Walker - 2 wheels;Walker - 4 wheels;Bedside commode;Wheelchair - manual;Hospital bed      Prior Function Level of Independence: Needs assistance   Gait / Transfers Assistance Needed: pt indicates only amb ~10' to 3-in-1, otherwise trasnfers.    ADL's / Homemaking Assistance Needed: husband assisted with bathing/dressing        Hand Dominance        Extremity/Trunk Assessment   Upper Extremity Assessment: Generalized weakness           Lower Extremity Assessment: Generalized weakness      Cervical / Trunk Assessment: Kyphotic  Communication   Communication: No difficulties  Cognition Arousal/Alertness: Awake/alert Behavior During Therapy: WFL for tasks assessed/performed Overall Cognitive Status: Within Functional Limits for tasks assessed  General Comments      Exercises        Assessment/Plan    PT Assessment Patient needs continued PT services  PT Diagnosis Difficulty walking;Generalized weakness   PT Problem List Decreased strength;Decreased activity tolerance;Decreased balance;Decreased mobility;Decreased knowledge of use of DME;Cardiopulmonary status limiting activity  PT Treatment Interventions DME instruction;Gait training;Functional mobility training;Therapeutic activities;Therapeutic exercise;Balance training;Patient/family education   PT Goals (Current goals can be found in the Care Plan  section) Acute Rehab PT Goals Patient Stated Goal: Home PT Goal Formulation: With patient Time For Goal Achievement: 06/26/14 Potential to Achieve Goals: Good    Frequency Min 3X/week   Barriers to discharge Decreased caregiver support pt's husband works during the day.      Co-evaluation               End of Session Equipment Utilized During Treatment: Oxygen Activity Tolerance: Patient limited by fatigue Patient left: in chair;with call bell/phone within reach;with nursing/sitter in room Nurse Communication: Mobility status         Time: 1610-9604 PT Time Calculation (min): 48 min   Charges:   PT Evaluation $Initial PT Evaluation Tier I: 1 Procedure PT Treatments $Therapeutic Activity: 23-37 mins   PT G CodesSunny Schlein, [place] [phone] 06/12/2014, 11:08 AM

## 2014-06-12 NOTE — Progress Notes (Signed)
Inpatient Diabetes Program Recommendations  AACE/ADA: New Consensus Statement on Inpatient Glycemic Control (2013)  Target Ranges:  Prepandial:   less than 140 mg/dL      Peak postprandial:   less than 180 mg/dL (1-2 hours)      Critically ill patients:  140 - 180 mg/dL  Results for Kimberly [name], Kimberly [name] (MRN [id number]) as of 06/12/2014 10:28  Ref. Range 06/11/2014 06:06 06/11/2014 12:35 06/11/2014 16:15 06/11/2014 21:20 06/12/2014 05:43  Glucose-Capillary Latest Range: 70-99 mg/dL 865129 (H) 784196 (H) 696240 (H) 273 (H) 302 (H)   Inpatient Diabetes Program Recommendations Insulin - Basal: consider increase Lantus to 1/2 home dose. Correction (SSI): and/or consider increase to RESIST scale during steroid therapy Thank you  Piedad ClimesGina Jahlisa Rossitto BSN, RN,CDE Inpatient Diabetes Coordinator [phone] (team pager)

## 2014-06-12 NOTE — Progress Notes (Addendum)
Patient Name: Galen ManilaSusan K Sturgill Date of Encounter: 06/12/2014     Principal Problem:   Anasarca Active Problems:   DM   HYPERLIPIDEMIA   TOBACCO ABUSE   COPD   Acute on chronic diastolic CHF (congestive heart failure), NYHA class 1   CKD (chronic kidney disease), stage III   AKI (acute kidney injury)   Unspecified hypothyroidism   Mitral valve stenosis, moderate   Pulmonary HTN    SUBJECTIVE  No significant SOB or CP  CURRENT MEDS . cloNIDine  0.1 mg Oral BID  . furosemide  80 mg Intravenous Q12H  . heparin  5,000 Units Subcutaneous 3 times per day  . insulin aspart  0-5 Units Subcutaneous QHS  . insulin aspart  0-9 Units Subcutaneous TID WC  . insulin glargine  5 Units Subcutaneous QHS  . levofloxacin (LEVAQUIN) IV  750 mg Intravenous Q48H  . levothyroxine  25 mcg Oral QAC breakfast  . pregabalin  75 mg Oral QPM  . tiotropium  18 mcg Inhalation Daily    OBJECTIVE  Filed Vitals:   06/11/14 2013 06/12/14 0404 06/12/14 0649 06/12/14 0913  BP: 94/55  129/72   Pulse: 96  84   Temp: 98.4 F (36.9 C)  97.6 F (36.4 C)   TempSrc: Oral  Oral   Resp: 18  20   Height:      Weight:  325 lb 11.2 oz (147.737 kg)    SpO2: 97%  95% 94%    Intake/Output Summary (Last 24 hours) at 06/12/14 1026 Last data filed at 06/12/14 1010  Gross per 24 hour  Intake    270 ml  Output   3495 ml  Net  -3225 ml   Filed Weights   06/10/14 0251 06/11/14 0530 06/12/14 0404  Weight: 337 lb 12.8 oz (153.225 kg) 331 lb 3.2 oz (150.231 kg) 325 lb 11.2 oz (147.737 kg)    PHYSICAL EXAM  General: Pleasant, NAD. Morbidly obese Neuro: Alert and oriented X 3. Moves all extremities spontaneously. Psych: Normal affect. HEENT:  Normal  Neck: Supple without bruits or JVD. Lungs:  Resp regular and unlabored, anterior exam CTA, ?intermittent rale Heart: RRR no s3, s4, or murmurs. Abdomen:  BS + x 4. Distended, nontender Extremities: No clubbing, cyanosis. DP/PT/Radials 2+ and equal bilaterally.  Anasarca, nonpitting mostly with bilateral LE cellulitis  Accessory Clinical Findings  CBC  Recent Labs  06/11/14 0502 06/12/14 0400  WBC 8.4 20.4*  NEUTROABS 6.9 19.8*  HGB 10.0* 11.5*  HCT 30.3* 34.8*  MCV 90.4 89.5  PLT 210 215   Basic Metabolic Panel  Recent Labs  06/11/14 0502 06/12/14 0400  NA 134* 132*  K 4.2 3.7  CL 91* 85*  CO2 31 28  GLUCOSE 124* 313*  BUN 56* 58*  CREATININE 2.06* 2.00*  CALCIUM 8.8 9.5   Liver Function Tests  Recent Labs  06/11/14 0502 06/12/14 0400  AST 13 16  ALT 8 9  ALKPHOS 133* 152*  BILITOT 1.0 1.2  PROT 7.1 8.1  ALBUMIN 2.7* 3.0*   Cardiac Enzymes  Recent Labs  06/09/14 2310 06/10/14 0400 06/10/14 1035  TROPONINI <0.30 <0.30 <0.30   Hemoglobin A1C  Recent Labs  06/09/14 1818  HGBA1C 7.0*   Thyroid Function Tests  Recent Labs  06/09/14 2310 06/10/14 1403  TSH 18.360*  --   T3FREE  --  2.0*    TELE NSR with LBBB and HR 90-100      ECG  No new ekg  Echocardiogram  LV EF: 55% - 60%  ------------------------------------------------------------------- Indications: Edema 782.3.  ------------------------------------------------------------------- History: PMH: Coronary artery disease. Chronic obstructive pulmonary disease. Risk factors: Morbid obesity. Hypertension. Diabetes mellitus. Dyslipidemia.  ------------------------------------------------------------------- Study Conclusions  - Left ventricle: The cavity size was normal. Systolic function was normal. The estimated ejection fraction was in the range of 55% to 60%. Wall motion was normal; there were no regional wall motion abnormalities. Features are consistent with a pseudonormal left ventricular filling pattern, with concomitant abnormal relaxation and increased filling pressure (grade 2 diastolic dysfunction). Doppler parameters are consistent with elevated ventricular end-diastolic filling pressure. - Aortic valve: Trileaflet;  normal thickness leaflets. There was no regurgitation. - Mitral valve: Calcified annulus. Severely thickened, moderately calcified leaflets . The findings are consistent with moderate stenosis. Mean gradient (D): 7 mm Hg. - Left atrium: The atrium was mildly dilated. - Right ventricle: Systolic function was normal. - Tricuspid valve: There was mild regurgitation. - Pulmonary arteries: Systolic pressure was severely increased. PA peak pressure: 77 mm Hg (S).  Impressions:  - Normal biventricular size and systolic function. Grade 2 diastolic dysfunction with elevated filling pressures. Moderate mitral stenosis. Severe pulmonary hypertension, RVSP 77 mmHg.        Radiology/Studies  Ct Abdomen Pelvis Wo Contrast  06/10/2014   CLINICAL DATA:  Urinary retention. 50 lb weight gain over the last month. Seeping wounds from the abdominal area.  EXAM: CT ABDOMEN AND PELVIS WITHOUT CONTRAST  TECHNIQUE: Multidetector CT imaging of the abdomen and pelvis was performed following the standard protocol without IV contrast.  COMPARISON:  11/16/2006  FINDINGS: Technically limited study due to patient's body habitus.  Small right pleural effusion. Calcified granuloma in the left lung base.  Diffuse edema throughout the subcutaneous fat of the abdomen and pelvis. Free fluid in the abdomen and pelvis probably representing ascites.  The unenhanced appearance of the liver, spleen, pancreas, adrenal glands, inferior vena cava, and retroperitoneal lymph nodes is unremarkable. Calcifications in the renal hila bilaterally probably represent vascular calcifications. Extensive calcification of the abdominal aorta without aneurysm. Stomach, small bowel, and colon are not abnormally distended. Contrast material flows through to the colon without evidence of obstruction. No free air in the abdomen.  Pelvis: No pelvic mass or lymphadenopathy. Appendix is not identified. No significant lymphadenopathy in the pelvis. Bladder is  decompressed with a Foley catheter. Degenerative changes in the lumbar spine. No destructive bone lesions appreciated.  IMPRESSION: Technically limited study due to patient's body habitus. There is extensive edema throughout the subcutaneous fat of the abdomen. Small right pleural effusion. Moderate abdominal/pelvic ascites.   Electronically Signed   By: Burman Nieves M.D.   On: 06/10/2014 02:01   Dg Chest 1 View  06/09/2014   CLINICAL DATA:  Leg swelling with weight gain.  History of stroke.  EXAM: CHEST - 1 VIEW  COMPARISON:  10/13/2013 and 09/25/2013.  FINDINGS: Examination is limited by body habitus. 1855 hr. Cardiomegaly and chronic vascular congestion are stable. There is no confluent airspace opacity, pleural effusion or pneumothorax. No acute osseous findings are seen.  IMPRESSION: Cardiomegaly with mild vascular congestion.   Electronically Signed   By: Roxy Horseman M.D.   On: 06/09/2014 19:04   US Renal  06/09/2014   CLINICAL DATA:  Acute kidney injury. Weight gain of 50 lb in 30 days.  EXAM: RENAL/URINARY TRACT ULTRASOUND COMPLETE  COMPARISON:  CT abdomen and pelvis 11/16/2006  FINDINGS: Examination is technically limited due to the patient's body habitus and diffuse edema.  Right Kidney:  Right kidney is not visualized.  Left Kidney:  Left kidney is not visualized.  Bladder:  Bladder is not visualized.  IMPRESSION: Kidneys and bladder are not visualized due to patient's body habitus and diffuse edema. Nondiagnostic study.   Electronically Signed   By: Burman Nieves M.D.   On: 06/09/2014 23:59   US Abdomen Limited  06/10/2014   CLINICAL DATA:  Abdominal distention.  EXAM: LIMITED ABDOMEN ULTRASOUND FOR ASCITES  TECHNIQUE: Limited ultrasound survey for ascites was performed in all four abdominal quadrants.  COMPARISON:  None.  FINDINGS: Images obtained of all 4 quadrants. Examination is technically limited due to body habitus and edema. No ascites is identified. However, the diagnostic  quality of the study is limited in ascites is not entirely excluded based on this study.  IMPRESSION: Technically limited study. No ascites identified but due to technical limitations cannot be excluded.   Electronically Signed   By: Burman Nieves M.D.   On: 06/10/2014 00:05    ASSESSMENT AND PLAN  1. Anasarca   - likely related to hypothyroidism   - no DVT seen on U/S   - continue IV diuresis, Cr stable, weight 343 --> 325  - no change in treatment today  2. Acute on chronic diastolic HF   - Echo 06/10/2014 EF 55-60%, grade 2 diastolic HF, moderate MS, PA peak pressure 21mmHgc/ severe pulmonary HTN although right sided chambers are normal in size.    3. Acute on chronic renal insufficiency   - possible postrenal obstruction, noted 1100 cc output after foley without diuretic   - nephrology following, baseline Cr 1.6. Renal US unable to see kidney 2/2 body habitus.   4. Chronic anemia  5. Hyponatremia - improving with diuresis  6. Hypothyroidism: elevated TSH, no prior history. Management per primary team   - low free T3 and free T4. Started on Synthroid  7. DM  8. Chronic LBBB  9. Chronic bilateral LE cellulitis 10. Leukocytosis: discussed with Dr. Robb Matar, WBC jumped from 8 to 20 overnight, pt denies any significant discomfort, actually feeling better. Pending wound care to take a look at her L toe. Apparently received 80mg  Solu-Medrol x 3 yesterday, maybe what caused the WBC jump  Signed, Amedeo Plenty Pager: [phone]  Agree with above note with minor changes Armanda Magic, MD [facility]

## 2014-06-12 NOTE — Progress Notes (Signed)
Pt's blood sugar 446mg /dl Informed Dr. David StallFeliz-Ortiz and asked for insulin correction, as BS exceeded the Novolog SS  Order received to give Lantus 55 units.  Another paged placed to clarify if Novolog insulin not needed.  No return call received.  Will continue to monitor.  Amanda PeaNellie Madisyn Mawhinney, Charity fundraiserN.

## 2014-06-12 NOTE — Evaluation (Signed)
Clinical/Bedside Swallow Evaluation Patient Details  Name: RAMINA HULET MRN: [id number] Date of Birth: 1956-01-08  Today's Date: 06/12/2014 Time: 4098-1191 SLP Time Calculation (min): 31 min  Past Medical History:  Past Medical History  Diagnosis Date  . HYPERLIPIDEMIA   . HYPERTENSION   . CAD, NATIVE VESSEL     May 10, 2010 cath showed a hyperdynamic LV function, she had dominant circumflex anatomy with a 70-80% small OM1. She had diffuse diabetic plaque particularly in the distal LAD. She nondominant RCA.  Nondominant  . PVD     CEA  . DM   . COPD   . Edema   . Stroke   . CHF (congestive heart failure)     Preserved EF  . Complication of anesthesia     DIFFICULT WAKING   . Cellulitis 10/15/2013    BILATERAL  . Chronic kidney disease   . Diabetic neuropathy   . Anasarca 05/2014   Past Surgical History:  Past Surgical History  Procedure Laterality Date  . Eye surgery    . Carotid endarterectomy     HPI:  This is a 58 y.o. year old female with significant past medical history of morbid obesity, grade 1 diastolic dysfunction, IDDM, CAD, stage 3 CKD, chronic resp failure on 2L [place] at home presenting with anasarca, diastolic CHF exacerbation, AKI   Assessment / Plan / Recommendation Clinical Impression  Pt demonstrates swallow function WNL. 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 Her description of dysphagia is mild with difficulty transiting dry solids and need for moist/pureed meats and aspiration precautions with liquids. SLP reinforced precautions with RN, taught pt and caregiver about strategies for posture and modified diet  accordingly. Pt may continue regular diet and thin liquids, but would prefer pureed meats if possible. No SLP f/u needed.     Aspiration Risk  Mild    Diet Recommendation Regular;Thin liquid (with pureed meats)   Liquid Administration via: Cup;Straw Medication  Administration: Whole meds with puree Supervision: Patient able to self feed Compensations: Slow rate;Small sips/bites Postural Changes and/or Swallow Maneuvers: Seated upright 90 degrees    Other  Recommendations Oral Care Recommendations: Oral care BID   Follow Up Recommendations  None    Frequency and Duration        Pertinent Vitals/Pain NA    SLP Swallow Goals     Swallow Study Prior Functional Status       General HPI: This is a 58 y.o. year old female with significant past medical history of morbid obesity, grade 1 diastolic dysfunction, IDDM, CAD, stage 3 CKD, chronic resp failure on 2L [place] at home presenting with anasarca, diastolic CHF exacerbation, AKI Type of Study: Bedside swallow evaluation Diet Prior to this Study: Regular;Thin liquids Temperature Spikes Noted: No Respiratory Status: Nasal cannula History of Recent Intubation: No Behavior/Cognition: Alert;Cooperative;Pleasant mood Oral Cavity - Dentition: Adequate natural dentition Self-Feeding Abilities: Able to feed self Patient Positioning: Upright in bed Baseline Vocal Quality: Clear Volitional Cough: Strong Volitional Swallow: Able to elicit    Oral/Motor/Sensory Function Overall Oral Motor/Sensory Function: Appears within functional limits for tasks assessed   Ice Chips     Thin Liquid Thin Liquid: Within functional limits    Nectar Thick     Honey Thick     Puree Puree: Within functional limits   Solid   GO    Solid: Not tested      Harlon Ditty, MA  CCC-SLP [phone]  Jaidan Prevette, Riley NearingBonnie Caroline 06/12/2014,3:55 PM

## 2014-06-13 DIAGNOSIS — L03119 Cellulitis of unspecified part of limb: Secondary | ICD-10-CM

## 2014-06-13 LAB — GLUCOSE, CAPILLARY
GLUCOSE-CAPILLARY: 454 mg/dL — AB (ref 70–99)
GLUCOSE-CAPILLARY: 556 mg/dL — AB (ref 70–99)
Glucose-Capillary: 477 mg/dL — ABNORMAL HIGH (ref 70–99)
Glucose-Capillary: 444 mg/dL — ABNORMAL HIGH (ref 70–99)
Glucose-Capillary: 352 mg/dL — ABNORMAL HIGH (ref 70–99)

## 2014-06-13 LAB — COMPREHENSIVE METABOLIC PANEL
ALT: 11 U/L (ref 0–35)
AST: 29 U/L (ref 0–37)
Albumin: 3 g/dL — ABNORMAL LOW (ref 3.5–5.2)
Alkaline Phosphatase: 127 U/L — ABNORMAL HIGH (ref 39–117)
Anion gap: 14 (ref 5–15)
BUN: 66 mg/dL — ABNORMAL HIGH (ref 6–23)
CALCIUM: 9.4 mg/dL (ref 8.4–10.5)
CO2: 32 meq/L (ref 19–32)
CREATININE: 1.95 mg/dL — AB (ref 0.50–1.10)
Chloride: 86 mEq/L — ABNORMAL LOW (ref 96–112)
GFR calc Af Amer: 31 mL/min — ABNORMAL LOW (ref >90)
GFR, EST NON AFRICAN AMERICAN: 27 mL/min — AB (ref >90)
Glucose, Bld: 524 mg/dL — ABNORMAL HIGH (ref 70–99)
Potassium: 4.5 mEq/L (ref 3.7–5.3)
Sodium: 132 mEq/L — ABNORMAL LOW (ref 137–147)
Total Bilirubin: 0.8 mg/dL (ref 0.3–1.2)
Total Protein: 8.1 g/dL (ref 6.0–8.3)

## 2014-06-13 LAB — CBC WITH DIFFERENTIAL/PLATELET
Basophils Absolute: 0 10*3/uL (ref 0.0–0.1)
Basophils Relative: 0 % (ref 0–1)
EOS PCT: 0 % (ref 0–5)
Eosinophils Absolute: 0 10*3/uL (ref 0.0–0.7)
HCT: 33.6 % — ABNORMAL LOW (ref 36.0–46.0)
Hemoglobin: 11 g/dL — ABNORMAL LOW (ref 12.0–15.0)
LYMPHS PCT: 3 % — AB (ref 12–46)
Lymphs Abs: 0.4 10*3/uL — ABNORMAL LOW (ref 0.7–4.0)
MCH: 30.1 pg (ref 26.0–34.0)
MCHC: 32.7 g/dL (ref 30.0–36.0)
MCV: 92.1 fL (ref 78.0–100.0)
MONOS PCT: 3 % (ref 3–12)
Monocytes Absolute: 0.4 10*3/uL (ref 0.1–1.0)
Neutro Abs: 13.8 10*3/uL — ABNORMAL HIGH (ref 1.7–7.7)
Neutrophils Relative %: 94 % — ABNORMAL HIGH (ref 43–77)
PLATELETS: 225 10*3/uL (ref 150–400)
RBC: 3.65 MIL/uL — ABNORMAL LOW (ref 3.87–5.11)
RDW: 14.9 % (ref 11.5–15.5)
WBC: 14.6 10*3/uL — AB (ref 4.0–10.5)

## 2014-06-13 MED ORDER — METHOCARBAMOL 500 MG PO TABS
500.0000 mg | ORAL_TABLET | Freq: Three times a day (TID) | ORAL | Status: DC | PRN
  Administered 2014-06-13: 500 mg via ORAL
  Filled 2014-06-13: qty 1

## 2014-06-13 MED ORDER — OXYCODONE HCL 5 MG PO TABS
5.0000 mg | ORAL_TABLET | Freq: Four times a day (QID) | ORAL | Status: DC | PRN
  Administered 2014-06-13: 5 mg via ORAL
  Filled 2014-06-13 (×6): qty 1

## 2014-06-13 MED ORDER — METOPROLOL TARTRATE 25 MG PO TABS
25.0000 mg | ORAL_TABLET | Freq: Two times a day (BID) | ORAL | Status: DC
  Administered 2014-06-13 – 2014-06-22 (×19): 25 mg via ORAL
  Filled 2014-06-13 (×20): qty 1

## 2014-06-13 MED ORDER — METOLAZONE 2.5 MG PO TABS
2.5000 mg | ORAL_TABLET | Freq: Two times a day (BID) | ORAL | Status: DC
  Administered 2014-06-13 (×2): 2.5 mg via ORAL
  Filled 2014-06-13 (×4): qty 1

## 2014-06-13 MED ORDER — INSULIN ASPART 100 UNIT/ML ~~LOC~~ SOLN
4.0000 [IU] | Freq: Three times a day (TID) | SUBCUTANEOUS | Status: DC
Start: 2014-06-13 — End: 2014-06-16
  Administered 2014-06-13 – 2014-06-15 (×5): 4 [IU] via SUBCUTANEOUS

## 2014-06-13 MED ORDER — INSULIN ASPART 100 UNIT/ML ~~LOC~~ SOLN
0.0000 [IU] | Freq: Every day | SUBCUTANEOUS | Status: DC
  Administered 2014-06-13: 21:00:00 via SUBCUTANEOUS
  Administered 2014-06-14: 2 [IU] via SUBCUTANEOUS
  Administered 2014-06-15: 3 [IU] via SUBCUTANEOUS

## 2014-06-13 MED ORDER — BRINZOLAMIDE-BRIMONIDINE 1-0.2 % OP SUSP
1.0000 [drp] | Freq: Two times a day (BID) | OPHTHALMIC | Status: DC
  Administered 2014-06-13: 21:00:00 via OPHTHALMIC
  Administered 2014-06-14 – 2014-06-18 (×10): 1 [drp] via OPHTHALMIC
  Administered 2014-06-19 (×2): via OPHTHALMIC
  Administered 2014-06-20 – 2014-06-23 (×7): 1 [drp] via OPHTHALMIC
  Administered 2014-06-23: 12:00:00 via OPHTHALMIC
  Administered 2014-06-24 – 2014-06-25 (×3): 1 [drp] via OPHTHALMIC
  Administered 2014-06-26: 09:00:00 via OPHTHALMIC
  Administered 2014-06-26 – 2014-06-28 (×3): 1 [drp] via OPHTHALMIC
  Administered 2014-06-28: 10:00:00 via OPHTHALMIC
  Administered 2014-06-29 – 2014-07-01 (×6): 1 [drp] via OPHTHALMIC

## 2014-06-13 MED ORDER — INSULIN ASPART 100 UNIT/ML ~~LOC~~ SOLN
35.0000 [IU] | Freq: Once | SUBCUTANEOUS | Status: AC
  Administered 2014-06-13: 35 [IU] via SUBCUTANEOUS

## 2014-06-13 MED ORDER — INSULIN ASPART 100 UNIT/ML ~~LOC~~ SOLN
0.0000 [IU] | Freq: Three times a day (TID) | SUBCUTANEOUS | Status: DC
  Administered 2014-06-14 (×2): 5 [IU] via SUBCUTANEOUS
  Administered 2014-06-14: 8 [IU] via SUBCUTANEOUS
  Administered 2014-06-15: 3 [IU] via SUBCUTANEOUS
  Administered 2014-06-15: 2 [IU] via SUBCUTANEOUS
  Administered 2014-06-15: 3 [IU] via SUBCUTANEOUS

## 2014-06-13 MED ORDER — INSULIN ASPART 100 UNIT/ML ~~LOC~~ SOLN
20.0000 [IU] | Freq: Once | SUBCUTANEOUS | Status: AC
  Administered 2014-06-13: 20 [IU] via SUBCUTANEOUS

## 2014-06-13 MED ORDER — INSULIN ASPART 100 UNIT/ML ~~LOC~~ SOLN
8.0000 [IU] | Freq: Once | SUBCUTANEOUS | Status: AC
  Administered 2014-06-13: 8 [IU] via SUBCUTANEOUS

## 2014-06-13 MED ORDER — CYCLOBENZAPRINE HCL 10 MG PO TABS
5.0000 mg | ORAL_TABLET | Freq: Three times a day (TID) | ORAL | Status: DC | PRN
  Administered 2014-06-13: 5 mg via ORAL
  Filled 2014-06-13: qty 1

## 2014-06-13 MED ORDER — INSULIN GLARGINE 100 UNIT/ML ~~LOC~~ SOLN
70.0000 [IU] | Freq: Two times a day (BID) | SUBCUTANEOUS | Status: DC
  Administered 2014-06-13 – 2014-06-17 (×9): 70 [IU] via SUBCUTANEOUS
  Filled 2014-06-13 (×11): qty 0.7

## 2014-06-13 NOTE — Progress Notes (Signed)
Patient Name: Kimberly ManilaSusan K [name] Date of Encounter: 06/13/2014     Principal Problem:   Anasarca Active Problems:   DM   HYPERLIPIDEMIA   TOBACCO ABUSE   COPD   Acute on chronic diastolic CHF (congestive heart failure), NYHA class 1   CKD (chronic kidney disease), stage III   AKI (acute kidney injury)   Unspecified hypothyroidism   Mitral valve stenosis, moderate   Pulmonary HTN    SUBJECTIVE  No significant SOB or CP today. Net negative another 3L yesterday (total -10L).  CURRENT MEDS . furosemide  80 mg Intravenous Q12H  . heparin  5,000 Units Subcutaneous 3 times per day  . insulin aspart  0-15 Units Subcutaneous TID WC  . insulin aspart  0-5 Units Subcutaneous QHS  . insulin aspart  4 Units Subcutaneous TID WC  . insulin glargine  55 Units Subcutaneous BID  . levofloxacin (LEVAQUIN) IV  750 mg Intravenous Q48H  . levothyroxine  25 mcg Oral QAC breakfast  . metolazone  2.5 mg Oral BID  . metoprolol tartrate  25 mg Oral BID  . pregabalin  75 mg Oral QPM  . tiotropium  18 mcg Inhalation Daily    OBJECTIVE  Filed Vitals:   06/12/14 2157 06/13/14 0500 06/13/14 0750 06/13/14 1014  BP: 186/88 168/72  140/60  Pulse: 103 99  93  Temp: 97.5 F (36.4 C) 98 F (36.7 C)    TempSrc: Oral Oral    Resp: 20 20    Height:      Weight:  319 lb 0.1 oz (144.7 kg)    SpO2: 96% 98% 97%     Intake/Output Summary (Last 24 hours) at 06/13/14 1158 Last data filed at 06/13/14 0900  Gross per 24 hour  Intake    480 ml  Output   2775 ml  Net  -2295 ml   Filed Weights   06/11/14 0530 06/12/14 0404 06/13/14 0500  Weight: 331 lb 3.2 oz (150.231 kg) 325 lb 11.2 oz (147.737 kg) 319 lb 0.1 oz (144.7 kg)    PHYSICAL EXAM  General: Pleasant, NAD. Morbidly obese Neuro: Alert and oriented X 3. Moves all extremities spontaneously. Psych: Normal affect. HEENT:  Normal  Neck: Supple without bruits or JVD. Lungs:  Resp regular and unlabored, anterior exam CTA, ?intermittent  rale Heart: RRR no s3, s4, or murmurs. Abdomen:  BS + x 4. Distended, nontender Extremities: No clubbing, cyanosis. DP/PT/Radials 2+ and equal bilaterally. Anasarca, nonpitting mostly with bilateral LE cellulitis  Accessory Clinical Findings  CBC  Recent Labs  06/12/14 0400 06/13/14 0404  WBC 20.4* 14.6*  NEUTROABS 19.8* 13.8*  HGB 11.5* 11.0*  HCT 34.8* 33.6*  MCV 89.5 92.1  PLT 215 225   Basic Metabolic Panel  Recent Labs  06/12/14 0400 06/13/14 0404  NA 132* 132*  K 3.7 4.5  CL 85* 86*  CO2 28 32  GLUCOSE 313* 524*  BUN 58* 66*  CREATININE 2.00* 1.95*  CALCIUM 9.5 9.4   Liver Function Tests  Recent Labs  06/12/14 0400 06/13/14 0404  AST 16 29  ALT 9 11  ALKPHOS 152* 127*  BILITOT 1.2 0.8  PROT 8.1 8.1  ALBUMIN 3.0* 3.0*   Cardiac Enzymes No results found for this basename: CKTOTAL, CKMB, CKMBINDEX, TROPONINI,  in the last 72 hours Hemoglobin A1C No results found for this basename: HGBA1C,  in the last 72 hours Thyroid Function Tests  Recent Labs  06/10/14 1403  T3FREE 2.0*    TELE  NSR with LBBB and HR 90-100      ECG  No new ekg  Echocardiogram  LV EF: 55% - 60%  ------------------------------------------------------------------- Indications: Edema 782.3.  ------------------------------------------------------------------- History: PMH: Coronary artery disease. Chronic obstructive pulmonary disease. Risk factors: Morbid obesity. Hypertension. Diabetes mellitus. Dyslipidemia.  ------------------------------------------------------------------- Study Conclusions  - Left ventricle: The cavity size was normal. Systolic function was normal. The estimated ejection fraction was in the range of 55% to 60%. Wall motion was normal; there were no regional wall motion abnormalities. Features are consistent with a pseudonormal left ventricular filling pattern, with concomitant abnormal relaxation and increased filling pressure (grade 2  diastolic dysfunction). Doppler parameters are consistent with elevated ventricular end-diastolic filling pressure. - Aortic valve: Trileaflet; normal thickness leaflets. There was no regurgitation. - Mitral valve: Calcified annulus. Severely thickened, moderately calcified leaflets . The findings are consistent with moderate stenosis. Mean gradient (D): 7 mm Hg. - Left atrium: The atrium was mildly dilated. - Right ventricle: Systolic function was normal. - Tricuspid valve: There was mild regurgitation. - Pulmonary arteries: Systolic pressure was severely increased. PA peak pressure: 77 mm Hg (S).  Impressions:  - Normal biventricular size and systolic function. Grade 2 diastolic dysfunction with elevated filling pressures. Moderate mitral stenosis. Severe pulmonary hypertension, RVSP 77 mmHg.        Radiology/Studies  Ct Abdomen Pelvis Wo Contrast  06/10/2014   CLINICAL DATA:  Urinary retention. 50 lb weight gain over the last month. Seeping wounds from the abdominal area.  EXAM: CT ABDOMEN AND PELVIS WITHOUT CONTRAST  TECHNIQUE: Multidetector CT imaging of the abdomen and pelvis was performed following the standard protocol without IV contrast.  COMPARISON:  11/16/2006  FINDINGS: Technically limited study due to patient's body habitus.  Small right pleural effusion. Calcified granuloma in the left lung base.  Diffuse edema throughout the subcutaneous fat of the abdomen and pelvis. Free fluid in the abdomen and pelvis probably representing ascites.  The unenhanced appearance of the liver, spleen, pancreas, adrenal glands, inferior vena cava, and retroperitoneal lymph nodes is unremarkable. Calcifications in the renal hila bilaterally probably represent vascular calcifications. Extensive calcification of the abdominal aorta without aneurysm. Stomach, small bowel, and colon are not abnormally distended. Contrast material flows through to the colon without evidence of obstruction. No free  air in the abdomen.  Pelvis: No pelvic mass or lymphadenopathy. Appendix is not identified. No significant lymphadenopathy in the pelvis. Bladder is decompressed with a Foley catheter. Degenerative changes in the lumbar spine. No destructive bone lesions appreciated.  IMPRESSION: Technically limited study due to patient's body habitus. There is extensive edema throughout the subcutaneous fat of the abdomen. Small right pleural effusion. Moderate abdominal/pelvic ascites.   Electronically Signed   By: Burman Nieves M.D.   On: 06/10/2014 02:01   Dg Chest 1 View  06/09/2014   CLINICAL DATA:  Leg swelling with weight gain.  History of stroke.  EXAM: CHEST - 1 VIEW  COMPARISON:  10/13/2013 and 09/25/2013.  FINDINGS: Examination is limited by body habitus. 1855 hr. Cardiomegaly and chronic vascular congestion are stable. There is no confluent airspace opacity, pleural effusion or pneumothorax. No acute osseous findings are seen.  IMPRESSION: Cardiomegaly with mild vascular congestion.   Electronically Signed   By: Roxy Horseman M.D.   On: 06/09/2014 19:04   US Renal  06/09/2014   CLINICAL DATA:  Acute kidney injury. Weight gain of 50 lb in 30 days.  EXAM: RENAL/URINARY TRACT ULTRASOUND COMPLETE  COMPARISON:  CT abdomen and  pelvis 11/16/2006  FINDINGS: Examination is technically limited due to the patient's body habitus and diffuse edema.  Right Kidney:  Right kidney is not visualized.  Left Kidney:  Left kidney is not visualized.  Bladder:  Bladder is not visualized.  IMPRESSION: Kidneys and bladder are not visualized due to patient's body habitus and diffuse edema. Nondiagnostic study.   Electronically Signed   By: Burman Nieves M.D.   On: 06/09/2014 23:59   US Abdomen Limited  06/10/2014   CLINICAL DATA:  Abdominal distention.  EXAM: LIMITED ABDOMEN ULTRASOUND FOR ASCITES  TECHNIQUE: Limited ultrasound survey for ascites was performed in all four abdominal quadrants.  COMPARISON:  None.  FINDINGS: Images  obtained of all 4 quadrants. Examination is technically limited due to body habitus and edema. No ascites is identified. However, the diagnostic quality of the study is limited in ascites is not entirely excluded based on this study.  IMPRESSION: Technically limited study. No ascites identified but due to technical limitations cannot be excluded.   Electronically Signed   By: Burman Nieves M.D.   On: 06/10/2014 00:05    ASSESSMENT AND PLAN  1. Anasarca   - likely related to hypothyroidism   - no DVT seen on U/S   - continue IV diuresis, Cr stable, weight 343 --> 319  2. Acute on chronic diastolic HF   - Echo 06/10/2014 EF 55-60%, grade 2 diastolic HF, moderate MS, PA peak pressure 32mmHgc/ severe pulmonary HTN although right sided chambers are normal in size.    3. Acute on chronic renal insufficiency   - possible postrenal obstruction, noted 1100 cc output after foley without diuretic   - nephrology following, baseline Cr 1.6. Renal US unable to see kidney 2/2 body habitus.   - creatinine continues to shows signs of improvement with diuresis  4. Chronic anemia  5. Hyponatremia - improving with diuresis  6. Hypothyroidism: elevated TSH, no prior history. Management per primary team   - low free T3 and free T4. Started on Synthroid  7. DM  8. Chronic LBBB  9. Chronic bilateral LE cellulitis 10. Leukocytosis: thought secondary to steroids. White count is down some today.  Chrystie Nose, MD, [place] Attending Cardiologist [facility]  Valjean Ruppel C

## 2014-06-13 NOTE — Progress Notes (Signed)
[facility] PROGRESS NOTE  Assessment/Plan: Anasarca : - Likely secondary to diastolic dysfunction  & probable new onset hypothyroidism and acute on chronic renal failure.  - TSH was elevated at 18.360.  free T4 and T3 low.  Acute on chronic diastolic CHF exacerbation  - Cardiac enzymes negative x3. 2-D echo with a EF of 55-60% with no wall motion abnormalities. - I/O cont to be neagtive. - weight cont to improve. Unknown dry weight - Continue IV Lasix regimen at current dose. Add low dose metolazone. - Cardiology following and appreciate input and recommendations.   Acute on chronic kidney disease stage III: - Baseline Cr 1.6, slowly trending down urine still clear. - Chronic component, Secondary to volume control diabetes and hypertension.  - Acute component due to vol overload. Renal U/S no good windows - Continue IV diuretics.   New onset hypothyroidism: - TSH on admission was 18.36. No prior history of hypothyroidism.  - start synthroid. - repeat TSH in 6 weeks.  Hyponatremia  - Likely secondary to a hypervolemic hyponatremia - Continue IV Lasix and follow sodium levels.   Chronic respiratory failure/COPD  Stable. Follow.   Diabetes mellitus  -now trending up start low dose Lantus. Start moderate scale SSI.  prophylaxis  Heparin for DVT prophylaxis.  Code Status: Full  Family Communication: Updated patient no family at bedside.  Disposition Plan: SNF    Consultants:  none  Procedures:  Renal U/s - 2-D echo has been obtained that shows grade 2 diastolic dysfunction with EF of 55-60% with no wall motion abnormalities. - Renal function trending down.   Antibiotics:  None  HPI/Subjective: No complains  Objective: Filed Vitals:   06/12/14 0913 06/12/14 1032 06/12/14 1355 06/12/14 2157  BP:  170/95 155/74 186/88  Pulse:  105 94 103  Temp:   97.5 F (36.4 C) 97.5 F (36.4 C)  TempSrc:   Oral Oral  Resp:   20 20  Height:      Weight:        SpO2: 94%  98% 96%    Intake/Output Summary (Last 24 hours) at 06/13/14 0720 Last data filed at 06/12/14 2200  Gross per 24 hour  Intake    360 ml  Output   2325 ml  Net  -1965 ml   Filed Weights   06/10/14 0251 06/11/14 0530 06/12/14 0404  Weight: 153.225 kg (337 lb 12.8 oz) 150.231 kg (331 lb 3.2 oz) 147.737 kg (325 lb 11.2 oz)    Exam:  General: Alert, awake, oriented x3, in no acute distress.  HEENT: No bruits, no goiter.  Heart: Regular rate and rhythm. Lungs: Good air movement, clear Abdomen: Soft, nontender, edema on her sacrum  Data Reviewed: Basic Metabolic Panel:  Recent Labs Lab 06/09/14 1818 06/10/14 0400 06/11/14 0502 06/12/14 0400 06/13/14 0404  NA 128* 129* 134* 132* 132*  K 4.3 3.7 4.2 3.7 4.5  CL 86* 87* 91* 85* 86*  CO2 31 31 31 28  32  GLUCOSE 92 80 124* 313* 524*  BUN 57* 57* 56* 58* 66*  CREATININE 2.27* 2.19* 2.06* 2.00* 1.95*  CALCIUM 9.1 8.9 8.8 9.5 9.4   Liver Function Tests:  Recent Labs Lab 06/09/14 1818 06/10/14 0400 06/11/14 0502 06/12/14 0400 06/13/14 0404  AST 15 17 13 16 29   ALT 11 10 8 9 11   ALKPHOS 159* 149* 133* 152* 127*  BILITOT 0.6 0.7 1.0 1.2 0.8  PROT 8.0 7.6 7.1 8.1 8.1  ALBUMIN 3.1* 2.9* 2.7* 3.0* 3.0*  No results found for this basename: LIPASE, AMYLASE,  in the last 168 hours No results found for this basename: AMMONIA,  in the last 168 hours CBC:  Recent Labs Lab 06/09/14 1818 06/10/14 0400 06/11/14 0502 06/12/14 0400 06/13/14 0404  WBC 9.4 9.2 8.4 20.4* 14.6*  NEUTROABS 7.9* 7.4 6.9 19.8* 13.8*  HGB 11.5* 10.7* 10.0* 11.5* 11.0*  HCT 34.4* 32.3* 30.3* 34.8* 33.6*  MCV 88.0 87.1 90.4 89.5 92.1  PLT 237 224 210 215 225   Cardiac Enzymes:  Recent Labs Lab 06/09/14 2310 06/10/14 0400 06/10/14 1035  TROPONINI <0.30 <0.30 <0.30   BNP (last 3 results)  Recent Labs  09/10/13 1827 10/13/13 1916 06/09/14 1818  PROBNP 4050.0* 1519.0* 4358.0*   CBG:  Recent Labs Lab 06/11/14 2120  06/12/14 0543 06/12/14 1114 06/12/14 1635 06/12/14 2155  GLUCAP 273* 302* 393* 446* 492*    No results found for this or any previous visit (from the past 240 hour(s)).   Studies: No results found.  Scheduled Meds: . cloNIDine  0.1 mg Oral BID  . furosemide  80 mg Intravenous Q12H  . heparin  5,000 Units Subcutaneous 3 times per day  . insulin aspart  0-15 Units Subcutaneous TID WC  . insulin aspart  0-5 Units Subcutaneous QHS  . insulin aspart  4 Units Subcutaneous TID WC  . insulin aspart  8 Units Subcutaneous Once  . insulin glargine  55 Units Subcutaneous BID  . levofloxacin (LEVAQUIN) IV  750 mg Intravenous Q48H  . levothyroxine  25 mcg Oral QAC breakfast  . pregabalin  75 mg Oral QPM  . tiotropium  18 mcg Inhalation Daily   Continuous Infusions:    Marinda ElkFELIZ ORTIZ, Melvern Ramone  [facility] Pager [phone]. If 8PM-8AM, please contact night-coverage at [email or web address], password [email or web address] 06/13/2014, 7:20 AM  LOS: 4 days

## 2014-06-13 NOTE — Progress Notes (Signed)
Patient CBG trending over 400 throughout shift. See results review for reference.  MD notified. Insulin given as ordered. Pt alert x4 and aware of plan of care.

## 2014-06-13 NOTE — Progress Notes (Signed)
Pt. With elevated blood glucose level this am. Dr. David StallFeliz-Ortiz on the floor and notified. On coming RN made aware. Seri Kimmer, Cheryll DessertKaren Cherrell

## 2014-06-13 NOTE — Progress Notes (Signed)
[facility] ROUNDING NOTE   Subjective:   Interval History: diuresing well  Objective:  Vital signs in last 24 hours:  Temp:  [97.5 F (36.4 C)-98 F (36.7 C)] 98 F (36.7 C) (10/03 0500) Pulse Rate:  [93-103] 93 (10/03 1014) Resp:  [20] 20 (10/03 0500) BP: (140-186)/(60-88) 140/60 mmHg (10/03 1014) SpO2:  [96 %-98 %] 97 % (10/03 0750) Weight:  [144.7 kg (319 lb 0.1 oz)] 144.7 kg (319 lb 0.1 oz) (10/03 0500)  Weight change: -3.037 kg (-6 lb 11.1 oz) Filed Weights   06/11/14 0530 06/12/14 0404 06/13/14 0500  Weight: 150.231 kg (331 lb 3.2 oz) 147.737 kg (325 lb 11.2 oz) 144.7 kg (319 lb 0.1 oz)    Intake/Output: I/O last 3 completed shifts: In: 360 [P.O.:360] Out: 4945 [Urine:4945]   Intake/Output this shift:  Total I/O In: 240 [P.O.:240] Out: -   General: Morbidly obese female, awake, alert, cooperative, NAD. HEENT: PERRL, EOMI. Moist mucus membranes Neck: Full range of motion without pain, supple, no lymphadenopathy or carotid bruits. Left-sided scar present (previous endarterectomy).  Lungs: Air entry equal bilaterally, normal work of respiration. Scattered wheezes, mild bibasilar crackles. On 2L O2 via Peters.  Heart: RRR, no murmurs, gallops, or rubs Abdomen: Obese, tense, woody pitting edema extending to mid-back. Distended. Distant bowel sounds present. Foley catheter in place.  Extremities: No cyanosis. +4 pitting edema (also w/ some underlying non-pitting edema?). Significant erythema and venous stasis changes on LE's bilaterally. No tenderness on examination.  Neurologic: Alert & oriented X3, cranial nerves II-XII intact, strength grossly intact, sensation intact to light touch     Basic Metabolic Panel:  Recent Labs Lab 06/09/14 1818 06/10/14 0400 06/11/14 0502 06/12/14 0400 06/13/14 0404  NA 128* 129* 134* 132* 132*  K 4.3 3.7 4.2 3.7 4.5  CL 86* 87* 91* 85* 86*  CO2 31 31 31 28  32  GLUCOSE 92 80 124* 313* 524*  BUN 57* 57* 56* 58* 66*   CREATININE 2.27* 2.19* 2.06* 2.00* 1.95*  CALCIUM 9.1 8.9 8.8 9.5 9.4    Liver Function Tests:  Recent Labs Lab 06/09/14 1818 06/10/14 0400 06/11/14 0502 06/12/14 0400 06/13/14 0404  AST 15 17 13 16 29   ALT 11 10 8 9 11   ALKPHOS 159* 149* 133* 152* 127*  BILITOT 0.6 0.7 1.0 1.2 0.8  PROT 8.0 7.6 7.1 8.1 8.1  ALBUMIN 3.1* 2.9* 2.7* 3.0* 3.0*   No results found for this basename: LIPASE, AMYLASE,  in the last 168 hours No results found for this basename: AMMONIA,  in the last 168 hours  CBC:  Recent Labs Lab 06/09/14 1818 06/10/14 0400 06/11/14 0502 06/12/14 0400 06/13/14 0404  WBC 9.4 9.2 8.4 20.4* 14.6*  NEUTROABS 7.9* 7.4 6.9 19.8* 13.8*  HGB 11.5* 10.7* 10.0* 11.5* 11.0*  HCT 34.4* 32.3* 30.3* 34.8* 33.6*  MCV 88.0 87.1 90.4 89.5 92.1  PLT 237 224 210 215 225    Cardiac Enzymes:  Recent Labs Lab 06/09/14 2310 06/10/14 0400 06/10/14 1035  TROPONINI <0.30 <0.30 <0.30    BNP: No components found with this basename: POCBNP,   CBG:  Recent Labs Lab 06/12/14 1114 06/12/14 1635 06/12/14 2155 06/13/14 0631 06/13/14 0908  GLUCAP 393* 446* 492* 454* 556*    Microbiology: Results for orders placed during the hospital encounter of 09/10/13  CULTURE, BLOOD (ROUTINE X 2)     Status: None   Collection Time    09/10/13  6:15 PM      Result Value  Ref Range Status   Specimen Description BLOOD RIGHT HAND   Final   Special Requests BOTTLES DRAWN AEROBIC ONLY 5CC   Final   Culture  Setup Time     Final   Value: 09/11/2013 02:45     [facility]   Culture     Final   Value: NO GROWTH 5 DAYS     [facility] at [facility]   Report Status 09/17/2013 FINAL   Final  CULTURE, BLOOD (ROUTINE X 2)     Status: None   Collection Time    09/10/13  6:27 PM      Result Value Ref Range Status   Specimen Description BLOOD LEFT HAND   Final   Special Requests BOTTLES DRAWN AEROBIC ONLY 10CC   Final   Culture  Setup Time     Final    Value: 09/11/2013 02:45     [facility]formed at [facility]   Culture     Final   Value: NO GROWTH 5 DAYS     [facility]formed at [facility]   Report Status 09/17/2013 FINAL   Final    Coagulation Studies: No results found for this basename: LABPROT, INR,  in the last 72 hours  Urinalysis: No results found for this basename: COLORURINE, APPERANCEUR, LABSPEC, PHURINE, GLUCOSEU, HGBUR, BILIRUBINUR, KETONESUR, PROTEINUR, UROBILINOGEN, NITRITE, LEUKOCYTESUR,  in the last 72 hours    Imaging: No results found.   Medications:     . furosemide  80 mg Intravenous Q12H  . heparin  5,000 Units Subcutaneous 3 times per day  . insulin aspart  0-15 Units Subcutaneous TID WC  . insulin aspart  0-5 Units Subcutaneous QHS  . insulin aspart  4 Units Subcutaneous TID WC  . insulin glargine  55 Units Subcutaneous BID  . levofloxacin (LEVAQUIN) IV  750 mg Intravenous Q48H  . levothyroxine  25 mcg Oral QAC breakfast  . metolazone  2.5 mg Oral BID  . metoprolol tartrate  25 mg Oral BID  . pregabalin  75 mg Oral QPM  . tiotropium  18 mcg Inhalation Daily   albuterol, methocarbamol, oxyCODONE, sorbitol  Assessment/ Plan:  58 y/o F w/ multiple co-morbidities, admitted on 06/09/14 w/ volume overload, likely 2/2 decompensated dCHF and new onset hypothyroidism. Also found to have acute on chronic renal failure and possible urinary obstruction.  Volume Overload- Previous ECHO from 09/2013 shows EF of 55% w/ grade 2 diastolic dysfunction. Had previously been taking lasix 40 mg po qd at home, increased to 160 mg bid by her PCP w/ no improvement in volume status or diuresis. Has gained ~70 lbs since 11/2013 according to recent weights. Patient w/ severe anasarca on exam. Has received 40 IV lasix in ED, now on 80 mg IV BID. Likely contributed to by recent discovery of hypothyroidism, TSH 18.3. No previous thyroid disease. Likely causing decreased cardiac output contributing to volume status. Agree w/ current  diuretic regimen. Strict I/O's, daily weights.  Acute on CKD stage 3- Baseline Cr ~1.6 according to previous labs, 2.27 on admission. UA w/out protein. CKD likely 2/2 DM vs uncontrolled HTN in the past. Acute injury associated w/ volume overload vs possible bladder dysfunction; patient states she has been having significant urinary retention. Renal US not able to see kidneys 2/2 body habitus. Cr improved. Continue Lasix as above. Will need outpatient renal follow up on discharge.  Hypothyroidism- TSH 18.36 on admission. No previous h/o hypothyroidism. Also admits to recent fatigue, dry  skin, cold intolerance, and weight gain. Decrease in metabolism likely resulting in poor cardiac output, contributing somewhat to vole status. T3, fT4 pending. Started on Synthroid 25 mcg qAM.  Hyponatremia- 128 on admission, 2/2 volume overload vs hypothyroidism. Continue to monitor.  Appears to be diuresing negative balance Morbid obesity and hypothyroidims. Suggest continued diuresis   Sign off please reconsult if needed      LOS: 4 Obryan Radu W @TODAY @12 :07 PM

## 2014-06-14 LAB — CBC WITH DIFFERENTIAL/PLATELET
BASOS ABS: 0 10*3/uL (ref 0.0–0.1)
Basophils Relative: 0 % (ref 0–1)
Eosinophils Absolute: 0 10*3/uL (ref 0.0–0.7)
Eosinophils Relative: 0 % (ref 0–5)
HCT: 33.3 % — ABNORMAL LOW (ref 36.0–46.0)
HEMOGLOBIN: 10.6 g/dL — AB (ref 12.0–15.0)
Lymphocytes Relative: 7 % — ABNORMAL LOW (ref 12–46)
Lymphs Abs: 0.8 10*3/uL (ref 0.7–4.0)
MCH: 29.2 pg (ref 26.0–34.0)
MCHC: 31.8 g/dL (ref 30.0–36.0)
MCV: 91.7 fL (ref 78.0–100.0)
MONOS PCT: 5 % (ref 3–12)
Monocytes Absolute: 0.6 10*3/uL (ref 0.1–1.0)
NEUTROS ABS: 10.6 10*3/uL — AB (ref 1.7–7.7)
NEUTROS PCT: 88 % — AB (ref 43–77)
PLATELETS: 198 10*3/uL (ref 150–400)
RBC: 3.63 MIL/uL — ABNORMAL LOW (ref 3.87–5.11)
RDW: 14.8 % (ref 11.5–15.5)
WBC: 12 10*3/uL — ABNORMAL HIGH (ref 4.0–10.5)

## 2014-06-14 LAB — BASIC METABOLIC PANEL
Anion gap: 11 (ref 5–15)
BUN: 76 mg/dL — ABNORMAL HIGH (ref 6–23)
CALCIUM: 9.4 mg/dL (ref 8.4–10.5)
CO2: 35 meq/L — AB (ref 19–32)
CREATININE: 2.55 mg/dL — AB (ref 0.50–1.10)
Chloride: 90 mEq/L — ABNORMAL LOW (ref 96–112)
GFR calc Af Amer: 23 mL/min — ABNORMAL LOW (ref >90)
GFR, EST NON AFRICAN AMERICAN: 19 mL/min — AB (ref >90)
GLUCOSE: 237 mg/dL — AB (ref 70–99)
Potassium: 4 mEq/L (ref 3.7–5.3)
Sodium: 136 mEq/L — ABNORMAL LOW (ref 137–147)

## 2014-06-14 LAB — GLUCOSE, CAPILLARY
GLUCOSE-CAPILLARY: 206 mg/dL — AB (ref 70–99)
GLUCOSE-CAPILLARY: 262 mg/dL — AB (ref 70–99)
Glucose-Capillary: 201 mg/dL — ABNORMAL HIGH (ref 70–99)
Glucose-Capillary: 217 mg/dL — ABNORMAL HIGH (ref 70–99)

## 2014-06-14 NOTE — Progress Notes (Signed)
Patient Name: RONETTE HANK Date of Encounter: 06/14/2014     Principal Problem:   Anasarca Active Problems:   DM   HYPERLIPIDEMIA   TOBACCO ABUSE   COPD   Acute on chronic diastolic CHF (congestive heart failure), NYHA class 1   CKD (chronic kidney disease), stage III   AKI (acute kidney injury)   Unspecified hypothyroidism   Mitral valve stenosis, moderate   Pulmonary HTN    SUBJECTIVE  No significant SOB or CP today. Net negative less yesterday, may not be fully recorded .Marland Kitchen Weight is down to 313 from 319 lbs (6 lbs overnight).  CURRENT MEDS . Brinzolamide-Brimonidine  1 drop Both Eyes BID  . heparin  5,000 Units Subcutaneous 3 times per day  . insulin aspart  0-15 Units Subcutaneous TID WC  . insulin aspart  0-5 Units Subcutaneous QHS  . insulin aspart  4 Units Subcutaneous TID WC  . insulin glargine  70 Units Subcutaneous BID  . levofloxacin (LEVAQUIN) IV  750 mg Intravenous Q48H  . levothyroxine  25 mcg Oral QAC breakfast  . metoprolol tartrate  25 mg Oral BID  . pregabalin  75 mg Oral QPM  . tiotropium  18 mcg Inhalation Daily    OBJECTIVE  Filed Vitals:   06/13/14 0750 06/13/14 1014 06/13/14 1300 06/14/14 0428  BP:  140/60 122/47 114/41  Pulse:  93 86 70  Temp:   98.7 F (37.1 C) 97.9 F (36.6 C)  TempSrc:   Oral Oral  Resp:   20 18  Height:      Weight:    313 lb 4.4 oz (142.1 kg)  SpO2: 97%  98% 100%    Intake/Output Summary (Last 24 hours) at 06/14/14 1049 Last data filed at 06/14/14 0900  Gross per 24 hour  Intake    360 ml  Output    875 ml  Net   -515 ml   Filed Weights   06/12/14 0404 06/13/14 0500 06/14/14 0428  Weight: 325 lb 11.2 oz (147.737 kg) 319 lb 0.1 oz (144.7 kg) 313 lb 4.4 oz (142.1 kg)    PHYSICAL EXAM  General: Pleasant, NAD. Morbidly obese Neuro: Alert and oriented X 3. Moves all extremities spontaneously. Psych: Normal affect. HEENT:  Normal  Neck: Supple without bruits or JVD. Lungs:  Resp regular and unlabored,  anterior exam CTA, ?intermittent rale Heart: RRR no s3, s4, or murmurs. Abdomen:  BS + x 4. Distended, nontender Extremities: No clubbing, cyanosis. DP/PT/Radials 2+ and equal bilaterally. Anasarca, nonpitting mostly with bilateral LE cellulitis  Accessory Clinical Findings  CBC  Recent Labs  06/13/14 0404 06/14/14 0409  WBC 14.6* 12.0*  NEUTROABS 13.8* 10.6*  HGB 11.0* 10.6*  HCT 33.6* 33.3*  MCV 92.1 91.7  PLT 225 198   Basic Metabolic Panel  Recent Labs  06/13/14 0404 06/14/14 0409  NA 132* 136*  K 4.5 4.0  CL 86* 90*  CO2 32 35*  GLUCOSE 524* 237*  BUN 66* 76*  CREATININE 1.95* 2.55*  CALCIUM 9.4 9.4   Liver Function Tests  Recent Labs  06/12/14 0400 06/13/14 0404  AST 16 29  ALT 9 11  ALKPHOS 152* 127*  BILITOT 1.2 0.8  PROT 8.1 8.1  ALBUMIN 3.0* 3.0*   Cardiac Enzymes No results found for this basename: CKTOTAL, CKMB, CKMBINDEX, TROPONINI,  in the last 72 hours Hemoglobin A1C No results found for this basename: HGBA1C,  in the last 72 hours Thyroid Function Tests No results found for  this basename: TSH, T4TOTAL, FREET3, T3FREE, THYROIDAB,  in the last 72 hours  TELE NSR with LBBB and HR 90-100      ECG  No new ekg  Echocardiogram  LV EF: 55% - 60%  ------------------------------------------------------------------- Indications: Edema 782.3.  ------------------------------------------------------------------- History: PMH: Coronary artery disease. Chronic obstructive pulmonary disease. Risk factors: Morbid obesity. Hypertension. Diabetes mellitus. Dyslipidemia.  ------------------------------------------------------------------- Study Conclusions  - Left ventricle: The cavity size was normal. Systolic function was normal. The estimated ejection fraction was in the range of 55% to 60%. Wall motion was normal; there were no regional wall motion abnormalities. Features are consistent with a pseudonormal left ventricular filling  pattern, with concomitant abnormal relaxation and increased filling pressure (grade 2 diastolic dysfunction). Doppler parameters are consistent with elevated ventricular end-diastolic filling pressure. - Aortic valve: Trileaflet; normal thickness leaflets. There was no regurgitation. - Mitral valve: Calcified annulus. Severely thickened, moderately calcified leaflets . The findings are consistent with moderate stenosis. Mean gradient (D): 7 mm Hg. - Left atrium: The atrium was mildly dilated. - Right ventricle: Systolic function was normal. - Tricuspid valve: There was mild regurgitation. - Pulmonary arteries: Systolic pressure was severely increased. PA peak pressure: 77 mm Hg (S).  Impressions:  - Normal biventricular size and systolic function. Grade 2 diastolic dysfunction with elevated filling pressures. Moderate mitral stenosis. Severe pulmonary hypertension, RVSP 77 mmHg.        Radiology/Studies  Ct Abdomen Pelvis Wo Contrast  06/10/2014   CLINICAL DATA:  Urinary retention. 50 lb weight gain over the last month. Seeping wounds from the abdominal area.  EXAM: CT ABDOMEN AND PELVIS WITHOUT CONTRAST  TECHNIQUE: Multidetector CT imaging of the abdomen and pelvis was performed following the standard protocol without IV contrast.  COMPARISON:  11/16/2006  FINDINGS: Technically limited study due to patient's body habitus.  Small right pleural effusion. Calcified granuloma in the left lung base.  Diffuse edema throughout the subcutaneous fat of the abdomen and pelvis. Free fluid in the abdomen and pelvis probably representing ascites.  The unenhanced appearance of the liver, spleen, pancreas, adrenal glands, inferior vena cava, and retroperitoneal lymph nodes is unremarkable. Calcifications in the renal hila bilaterally probably represent vascular calcifications. Extensive calcification of the abdominal aorta without aneurysm. Stomach, small bowel, and colon are not abnormally distended.  Contrast material flows through to the colon without evidence of obstruction. No free air in the abdomen.  Pelvis: No pelvic mass or lymphadenopathy. Appendix is not identified. No significant lymphadenopathy in the pelvis. Bladder is decompressed with a Foley catheter. Degenerative changes in the lumbar spine. No destructive bone lesions appreciated.  IMPRESSION: Technically limited study due to patient's body habitus. There is extensive edema throughout the subcutaneous fat of the abdomen. Small right pleural effusion. Moderate abdominal/pelvic ascites.   Electronically Signed   By: Burman Nieves M.D.   On: 06/10/2014 02:01   Dg Chest 1 View  06/09/2014   CLINICAL DATA:  Leg swelling with weight gain.  History of stroke.  EXAM: CHEST - 1 VIEW  COMPARISON:  10/13/2013 and 09/25/2013.  FINDINGS: Examination is limited by body habitus. 1855 hr. Cardiomegaly and chronic vascular congestion are stable. There is no confluent airspace opacity, pleural effusion or pneumothorax. No acute osseous findings are seen.  IMPRESSION: Cardiomegaly with mild vascular congestion.   Electronically Signed   By: Roxy Horseman M.D.   On: 06/09/2014 19:04   US Renal  06/09/2014   CLINICAL DATA:  Acute kidney injury. Weight gain of 50 lb  in 30 days.  EXAM: RENAL/URINARY TRACT ULTRASOUND COMPLETE  COMPARISON:  CT abdomen and pelvis 11/16/2006  FINDINGS: Examination is technically limited due to the patient's body habitus and diffuse edema.  Right Kidney:  Right kidney is not visualized.  Left Kidney:  Left kidney is not visualized.  Bladder:  Bladder is not visualized.  IMPRESSION: Kidneys and bladder are not visualized due to patient's body habitus and diffuse edema. Nondiagnostic study.   Electronically Signed   By: Burman NievesWilliam  Stevens M.D.   On: 06/09/2014 23:59   [place] Abdomen Limited  06/10/2014   CLINICAL DATA:  Abdominal distention.  EXAM: LIMITED ABDOMEN ULTRASOUND FOR ASCITES  TECHNIQUE: Limited ultrasound survey for ascites  was performed in all four abdominal quadrants.  COMPARISON:  None.  FINDINGS: Images obtained of all 4 quadrants. Examination is technically limited due to body habitus and edema. No ascites is identified. However, the diagnostic quality of the study is limited in ascites is not entirely excluded based on this study.  IMPRESSION: Technically limited study. No ascites identified but due to technical limitations cannot be excluded.   Electronically Signed   By: Burman NievesWilliam  Stevens M.D.   On: 06/10/2014 00:05    ASSESSMENT AND PLAN  1. Anasarca   - likely related to hypothyroidism   - no DVT seen on U/S   - continue IV diuresis, Cr stable, weight 343 --> 319 - > 313  2. Acute on chronic diastolic HF   - Echo 06/10/2014 EF 55-60%, grade 2 diastolic HF, moderate MS, PA peak pressure 377mmHgc/ severe pulmonary HTN although right sided chambers are normal in size.    3. Acute on chronic renal insufficiency   - possible postrenal obstruction, noted 1100 cc output after foley without diuretic   - nephrology following, baseline Cr 1.6. Renal US unable to see kidney 2/2 body habitus.   - creatinine worse today with diuresis - no comment from nephrology. Agree with holding lasix today,   may resume at a lower dose tomorrow if the creatinine is improved. 4. Chronic anemia  5. Hyponatremia - improving with diuresis  6. Hypothyroidism: elevated TSH, no prior history. Management per primary team   - low free T3 and free T4. Started on Synthroid  7. DM  8. Chronic LBBB  9. Chronic bilateral LE cellulitis 10. Leukocytosis: thought secondary to steroids. White count is down more today.  Chrystie NoseKenneth C. Gerre Ranum, MD, Methodist Hospital Of ChicagoFACC Attending Cardiologist [facility]  [place] C

## 2014-06-14 NOTE — Progress Notes (Signed)
[facility] PROGRESS NOTE  Assessment/Plan: Anasarca : - Likely secondary to diastolic dysfunction  & probable new onset hypothyroidism and acute on chronic renal failure.  - TSH was elevated at 18.360.  free T4 and T3 low, see below.  Acute on chronic diastolic CHF exacerbation  - Cardiac enzymes negative x3. 2-D echo with a EF of 55-60% with no wall motion abnormalities. - I/O cont to be neagtive. - weight cont to improve. Unknown dry weight - Hold IV Lasix and metolazone. - Cardiology following and appreciate input and recommendations.  Free fluid restriction.  Acute on chronic kidney disease stage III: - Baseline Cr 1.6, increase in Cr most likely due to over diuresis. - Chronic component, Secondary to volume control diabetes and hypertension.  - Acute component due to vol overload. Renal U/S no good windows - hold diuretics.  New onset hypothyroidism: - TSH on admission was 18.36. No prior history of hypothyroidism.  - started synthroid. - repeat TSH in 6 weeks.  Hyponatremia  - Likely secondary to a hypervolemic hyponatremia - Continue IV Lasix and follow sodium levels.   Chronic respiratory failure/COPD  Stable. Follow.   Diabetes mellitus  -now trending up start low dose Lantus. Start moderate scale SSI.  prophylaxis  Heparin for DVT prophylaxis.  Code Status: Full  Family Communication: Updated patient no family at bedside.  Disposition Plan: SNF    Consultants:  none  Procedures:  Renal U/s - 2-D echo has been obtained that shows grade 2 diastolic dysfunction with EF of 55-60% with no wall motion abnormalities. - Renal function trending down.   Antibiotics:  None  HPI/Subjective: No complains  Objective: Filed Vitals:   06/13/14 0750 06/13/14 1014 06/13/14 1300 06/14/14 0428  BP:  140/60 122/47 114/41  Pulse:  93 86 70  Temp:   98.7 F (37.1 C) 97.9 F (36.6 C)  TempSrc:   Oral Oral  Resp:   20 18  Height:      Weight:     142.1 kg (313 lb 4.4 oz)  SpO2: 97%  98% 100%    Intake/Output Summary (Last 24 hours) at 06/14/14 0946 Last data filed at 06/14/14 0427  Gross per 24 hour  Intake    240 ml  Output    875 ml  Net   -635 ml   Filed Weights   06/12/14 0404 06/13/14 0500 06/14/14 0428  Weight: 147.737 kg (325 lb 11.2 oz) 144.7 kg (319 lb 0.1 oz) 142.1 kg (313 lb 4.4 oz)    Exam:  General: Alert, awake, oriented x3, in no acute distress.  HEENT: No bruits, no goiter.  Heart: Regular rate and rhythm. Lungs: Good air movement, clear Abdomen: Soft, nontender, edema on her sacrum  Data Reviewed: Basic Metabolic Panel:  Recent Labs Lab 06/10/14 0400 06/11/14 0502 06/12/14 0400 06/13/14 0404 06/14/14 0409  NA 129* 134* 132* 132* 136*  K 3.7 4.2 3.7 4.5 4.0  CL 87* 91* 85* 86* 90*  CO2 31 31 28  32 35*  GLUCOSE 80 124* 313* 524* 237*  BUN 57* 56* 58* 66* 76*  CREATININE 2.19* 2.06* 2.00* 1.95* 2.55*  CALCIUM 8.9 8.8 9.5 9.4 9.4   Liver Function Tests:  Recent Labs Lab 06/09/14 1818 06/10/14 0400 06/11/14 0502 06/12/14 0400 06/13/14 0404  AST 15 17 13 16 29   ALT 11 10 8 9 11   ALKPHOS 159* 149* 133* 152* 127*  BILITOT 0.6 0.7 1.0 1.2 0.8  PROT 8.0 7.6 7.1 8.1 8.1  ALBUMIN 3.1* 2.9* 2.7* 3.0* 3.0*   No results found for this basename: LIPASE, AMYLASE,  in the last 168 hours No results found for this basename: AMMONIA,  in the last 168 hours CBC:  Recent Labs Lab 06/10/14 0400 06/11/14 0502 06/12/14 0400 06/13/14 0404 06/14/14 0409  WBC 9.2 8.4 20.4* 14.6* 12.0*  NEUTROABS 7.4 6.9 19.8* 13.8* 10.6*  HGB 10.7* 10.0* 11.5* 11.0* 10.6*  HCT 32.3* 30.3* 34.8* 33.6* 33.3*  MCV 87.1 90.4 89.5 92.1 91.7  PLT 224 210 215 225 198   Cardiac Enzymes:  Recent Labs Lab 06/09/14 2310 06/10/14 0400 06/10/14 1035  TROPONINI <0.30 <0.30 <0.30   BNP (last 3 results)  Recent Labs  09/10/13 1827 10/13/13 1916 06/09/14 1818  PROBNP 4050.0* 1519.0* 4358.0*   CBG:  Recent  Labs Lab 06/13/14 0908 06/13/14 1101 06/13/14 1619 06/13/14 2111 06/14/14 0615  GLUCAP 556* 477* 444* 352* 206*    No results found for this or any previous visit (from the past 240 hour(s)).   Studies: No results found.  Scheduled Meds: . Brinzolamide-Brimonidine  1 drop Both Eyes BID  . heparin  5,000 Units Subcutaneous 3 times per day  . insulin aspart  0-15 Units Subcutaneous TID WC  . insulin aspart  0-5 Units Subcutaneous QHS  . insulin aspart  4 Units Subcutaneous TID WC  . insulin glargine  70 Units Subcutaneous BID  . levofloxacin (LEVAQUIN) IV  750 mg Intravenous Q48H  . levothyroxine  25 mcg Oral QAC breakfast  . metoprolol tartrate  25 mg Oral BID  . pregabalin  75 mg Oral QPM  . tiotropium  18 mcg Inhalation Daily   Continuous Infusions:    Marinda Elk  [facility] Pager [phone]. If 8PM-8AM, please contact night-coverage at [email or web address], password [facility] 06/14/2014, 9:46 AM  LOS: 5 days

## 2014-06-14 NOTE — Progress Notes (Signed)
Patient complained of left arm pain in am at 5 on a scale of 1-10. Declined medication. Repositioned arm on pillow which caused relief. Patient concerned about left foot wound. MD notified and will assess in AM. Pt resting. Will continue to monitor.

## 2014-06-15 ENCOUNTER — Inpatient Hospital Stay (HOSPITAL_COMMUNITY): Payer: Managed Care, Other (non HMO)

## 2014-06-15 DIAGNOSIS — R609 Edema, unspecified: Secondary | ICD-10-CM

## 2014-06-15 DIAGNOSIS — E1122 Type 2 diabetes mellitus with diabetic chronic kidney disease: Secondary | ICD-10-CM

## 2014-06-15 DIAGNOSIS — J9611 Chronic respiratory failure with hypoxia: Secondary | ICD-10-CM

## 2014-06-15 DIAGNOSIS — N189 Chronic kidney disease, unspecified: Secondary | ICD-10-CM

## 2014-06-15 LAB — CBC WITH DIFFERENTIAL/PLATELET
BASOS ABS: 0 10*3/uL (ref 0.0–0.1)
BASOS PCT: 0 % (ref 0–1)
EOS ABS: 0.3 10*3/uL (ref 0.0–0.7)
EOS PCT: 3 % (ref 0–5)
HEMATOCRIT: 32.9 % — AB (ref 36.0–46.0)
Hemoglobin: 10.6 g/dL — ABNORMAL LOW (ref 12.0–15.0)
Lymphocytes Relative: 12 % (ref 12–46)
Lymphs Abs: 1.2 10*3/uL (ref 0.7–4.0)
MCH: 29.6 pg (ref 26.0–34.0)
MCHC: 32.2 g/dL (ref 30.0–36.0)
MCV: 91.9 fL (ref 78.0–100.0)
MONO ABS: 0.5 10*3/uL (ref 0.1–1.0)
Monocytes Relative: 6 % (ref 3–12)
Neutro Abs: 7.8 10*3/uL — ABNORMAL HIGH (ref 1.7–7.7)
Neutrophils Relative %: 79 % — ABNORMAL HIGH (ref 43–77)
Platelets: 186 10*3/uL (ref 150–400)
RBC: 3.58 MIL/uL — ABNORMAL LOW (ref 3.87–5.11)
RDW: 14.9 % (ref 11.5–15.5)
WBC: 9.8 10*3/uL (ref 4.0–10.5)

## 2014-06-15 LAB — BASIC METABOLIC PANEL
ANION GAP: 11 (ref 5–15)
Anion gap: 11 (ref 5–15)
BUN: 89 mg/dL — ABNORMAL HIGH (ref 6–23)
BUN: 98 mg/dL — AB (ref 6–23)
CALCIUM: 8.9 mg/dL (ref 8.4–10.5)
CO2: 35 mEq/L — ABNORMAL HIGH (ref 19–32)
CO2: 34 mEq/L — ABNORMAL HIGH (ref 19–32)
CREATININE: 3.08 mg/dL — AB (ref 0.50–1.10)
CREATININE: 3.2 mg/dL — AB (ref 0.50–1.10)
Calcium: 8.4 mg/dL (ref 8.4–10.5)
Chloride: 91 mEq/L — ABNORMAL LOW (ref 96–112)
Chloride: 90 mEq/L — ABNORMAL LOW (ref 96–112)
GFR calc Af Amer: 18 mL/min — ABNORMAL LOW (ref >90)
GFR calc Af Amer: 17 mL/min — ABNORMAL LOW (ref >90)
GFR calc non Af Amer: 15 mL/min — ABNORMAL LOW (ref >90)
GFR, EST NON AFRICAN AMERICAN: 16 mL/min — AB (ref >90)
GLUCOSE: 238 mg/dL — AB (ref 70–99)
Glucose, Bld: 162 mg/dL — ABNORMAL HIGH (ref 70–99)
Potassium: 4.1 mEq/L (ref 3.7–5.3)
Potassium: 4.1 mEq/L (ref 3.7–5.3)
Sodium: 137 mEq/L (ref 137–147)
Sodium: 135 mEq/L — ABNORMAL LOW (ref 137–147)

## 2014-06-15 LAB — GLUCOSE, CAPILLARY
Glucose-Capillary: 147 mg/dL — ABNORMAL HIGH (ref 70–99)
Glucose-Capillary: 159 mg/dL — ABNORMAL HIGH (ref 70–99)
Glucose-Capillary: 167 mg/dL — ABNORMAL HIGH (ref 70–99)
Glucose-Capillary: 260 mg/dL — ABNORMAL HIGH (ref 70–99)

## 2014-06-15 LAB — CARBOXYHEMOGLOBIN
CARBOXYHEMOGLOBIN: 1.6 % — AB (ref 0.5–1.5)
METHEMOGLOBIN: 0.7 % (ref 0.0–1.5)
O2 Saturation: 66 %
Total hemoglobin: 10.6 g/dL — ABNORMAL LOW (ref 12.0–16.0)

## 2014-06-15 LAB — MRSA PCR SCREENING: MRSA BY PCR: POSITIVE — AB

## 2014-06-15 MED ORDER — CETYLPYRIDINIUM CHLORIDE 0.05 % MT LIQD
7.0000 mL | Freq: Two times a day (BID) | OROMUCOSAL | Status: DC
  Administered 2014-06-16 – 2014-06-22 (×13): 7 mL via OROMUCOSAL

## 2014-06-15 MED ORDER — FENTANYL CITRATE 0.05 MG/ML IJ SOLN: 50.0000 ug | Freq: Once | INTRAMUSCULAR | Status: AC

## 2014-06-15 MED ORDER — METOLAZONE 5 MG PO TABS
5.0000 mg | ORAL_TABLET | Freq: Once | ORAL | Status: AC
  Administered 2014-06-15: 5 mg via ORAL
  Filled 2014-06-15: qty 1

## 2014-06-15 MED ORDER — DOPAMINE-DEXTROSE 3.2-5 MG/ML-% IV SOLN
3.0000 ug/kg/min | INTRAVENOUS | Status: DC
  Administered 2014-06-16: 3 ug/kg/min via INTRAVENOUS
  Filled 2014-06-15: qty 250

## 2014-06-15 MED ORDER — MIDAZOLAM HCL 2 MG/2ML IJ SOLN
1.0000 mg | Freq: Once | INTRAMUSCULAR | Status: AC
  Administered 2014-06-15: 1 mg via INTRAVENOUS

## 2014-06-15 MED ORDER — FENTANYL CITRATE 0.05 MG/ML IJ SOLN
INTRAMUSCULAR | Status: AC
  Administered 2014-06-15: 50 ug
  Filled 2014-06-15: qty 2

## 2014-06-15 MED ORDER — CHLORHEXIDINE GLUCONATE CLOTH 2 % EX PADS
6.0000 | MEDICATED_PAD | Freq: Every day | CUTANEOUS | Status: DC
  Administered 2014-06-16 – 2014-06-18 (×3): 6 via TOPICAL

## 2014-06-15 MED ORDER — SODIUM CHLORIDE 0.9 % IV BOLUS (SEPSIS)
500.0000 mL | Freq: Once | INTRAVENOUS | Status: AC
  Administered 2014-06-15: 500 mL via INTRAVENOUS

## 2014-06-15 MED ORDER — MIDAZOLAM HCL 2 MG/2ML IJ SOLN
INTRAMUSCULAR | Status: AC
  Filled 2014-06-15: qty 2

## 2014-06-15 MED ORDER — MILRINONE IN DEXTROSE 20 MG/100ML IV SOLN
0.2500 ug/kg/min | INTRAVENOUS | Status: DC
  Administered 2014-06-15 – 2014-06-22 (×20): 0.25 ug/kg/min via INTRAVENOUS
  Filled 2014-06-15 (×21): qty 100

## 2014-06-15 MED ORDER — FUROSEMIDE 10 MG/ML IJ SOLN
80.0000 mg | Freq: Once | INTRAMUSCULAR | Status: AC
  Administered 2014-06-15: 80 mg via INTRAVENOUS

## 2014-06-15 MED ORDER — MUPIROCIN 2 % EX OINT
1.0000 "application " | TOPICAL_OINTMENT | Freq: Two times a day (BID) | CUTANEOUS | Status: AC
  Administered 2014-06-15 – 2014-06-20 (×10): 1 via NASAL
  Filled 2014-06-15: qty 22

## 2014-06-15 MED ORDER — FUROSEMIDE 10 MG/ML IJ SOLN
15.0000 mg/h | INTRAVENOUS | Status: DC
  Administered 2014-06-15: 10 mg/h via INTRAVENOUS
  Administered 2014-06-17 – 2014-06-22 (×8): 15 mg/h via INTRAVENOUS
  Filled 2014-06-15 (×20): qty 25

## 2014-06-15 NOTE — Progress Notes (Signed)
Inpatient Diabetes Program Recommendations  AACE/ADA: New Consensus Statement on Inpatient Glycemic Control (2013)  Target Ranges:  Prepandial:   less than 140 mg/dL      Peak postprandial:   less than 180 mg/dL (1-2 hours)      Critically ill patients:  140 - 180 mg/dL   Results for Kimberly [name], [name] (MRN [id number]) as of 06/15/2014 10:26  Ref. Range 06/14/2014 06:15 06/14/2014 11:51 06/14/2014 16:06 06/14/2014 21:32 06/15/2014 06:15  Glucose-Capillary Latest Range: 70-99 mg/dL 914206 (H) 782262 (H) 956201 (H) 217 (H) 147 (H)    Diabetes history: DM2 Outpatient Diabetes medications: Lantus 55 units BID and Novolog 10-60 units TID with meals (if CBG is at least 100 mg/dl she takes 10 units and adds 1 unit for every 10 mg/dl) Current orders for Inpatient glycemic control: Lantus 70 units BID, Novolog 0-15 units AC, Novolog 0-5 units HS, Novolog 4 units TID with meals  Inpatient Diabetes Program Recommendations Insulin - Meal Coverage: Please consider increasing meal coverage to Novolog 8 units TID with meals if patient eats at least 50% of meal.  Thanks, Orlando PennerMarie Amiley Shishido, RN, MSN, CCRN Diabetes Coordinator Inpatient Diabetes Program [phone] (Team Pager) [phone] (AP office) [phone] [facility] office)

## 2014-06-15 NOTE — Progress Notes (Signed)
   HF Team requested to assist with worsening renal function and massive volume overload. Diuretics have been held. GIven 500 cc NS bolus today.   After talking with her she has gained over 75 pounds in the last 6 months with 50 of those in the last month. .    Massive anasarca extending to abdomen.   Will move to stepdown and place central access to further guide therapy. Start lasix drip 10 mg per hour with 80 mg bolus.   [name] is agreeable to transfer.   CLEGG,AMY NP-C  12:37 PM  Patient seen and examined with Kimberly BecketAmy Clegg, NP. We discussed all aspects of the encounter. I agree with the assessment and plan as stated above.   Volume status markedly elevated. However diuresis limited by cardiorenal syndrome. Suspect significant RH failure. Will place central access to follow CPV and co-ox. Will likely need dopamine or milrinone. RHC prior to d/c.   Shelvy Heckert,MD 5:52 PM

## 2014-06-15 NOTE — Progress Notes (Signed)
[facility] PROGRESS NOTE  Assessment/Plan: Acute on chronic diastolic CHF exacerbation  - Cardiac enzymes negative x3.  - I/O cont to be negative. - Unknown dry weight - Hold IV Lasix and metolazone, gave 500cc bolus - Cardiology following and appreciate input and recommendations.    Acute on chronic kidney disease stage III: - Baseline Cr 1.6, increase in Cr most likely due to over diuresis. - Chronic component, Secondary to uncontrol diabetes and hypertension.  - Acute component due to vol overload. Renal U/S no good windows - hold diuretics.  New onset hypothyroidism: - TSH on admission was 18.36. No prior history of hypothyroidism.  - Started synthroid. - Repeat TSH in 6 weeks.  Hyponatremia  - Likely secondary to a hypervolemic hyponatremia - Resolved with diuresis.  Chronic respiratory failure/COPD  Stable. Follow.   Diabetes mellitus  -now trending up start low dose Lantus. Start moderate scale SSI.  Prophylaxis  Heparin for DVT prophylaxis.   Code Status: Full  Family Communication: Updated patient no family at bedside.  Disposition Plan: SNF    Consultants:  none  Procedures:  Renal U/s - 2-D echo has been obtained that shows grade 2 diastolic dysfunction with EF of 55-60% with no wall motion abnormalities. - Renal function trending down.   Antibiotics:  None  HPI/Subjective: No complains  Objective: Filed Vitals:   06/14/14 1300 06/14/14 2128 06/15/14 0638 06/15/14 0846  BP: 117/69 116/72 117/48   Pulse: 100 98 82   Temp: 97.8 F (36.6 C) 97.4 F (36.3 C) 98.2 F (36.8 C)   TempSrc: Oral Oral Oral   Resp: 18 18 18    Height:      Weight:   144.2 kg (317 lb 14.5 oz)   SpO2: 100% 100% 100% 100%    Intake/Output Summary (Last 24 hours) at 06/15/14 1045 Last data filed at 06/15/14 0901  Gross per 24 hour  Intake    720 ml  Output    650 ml  Net     70 ml   Filed Weights   06/13/14 0500 06/14/14 0428 06/15/14 0638    Weight: 144.7 kg (319 lb 0.1 oz) 142.1 kg (313 lb 4.4 oz) 144.2 kg (317 lb 14.5 oz)    Exam:  General: Alert, awake, oriented x3, in no acute distress.  HEENT: No bruits, no goiter.  Heart: Regular rate and rhythm. Lungs: Good air movement, clear Abdomen: Soft, nontender, edema on her sacrum  Data Reviewed: Basic Metabolic Panel:  Recent Labs Lab 06/11/14 0502 06/12/14 0400 06/13/14 0404 06/14/14 0409 06/15/14 0315  NA 134* 132* 132* 136* 137  K 4.2 3.7 4.5 4.0 4.1  CL 91* 85* 86* 90* 91*  CO2 31 28 32 35* 35*  GLUCOSE 124* 313* 524* 237* 162*  BUN 56* 58* 66* 76* 89*  CREATININE 2.06* 2.00* 1.95* 2.55* 3.08*  CALCIUM 8.8 9.5 9.4 9.4 8.9   Liver Function Tests:  Recent Labs Lab 06/09/14 1818 06/10/14 0400 06/11/14 0502 06/12/14 0400 06/13/14 0404  AST 15 17 13 16 29   ALT 11 10 8 9 11   ALKPHOS 159* 149* 133* 152* 127*  BILITOT 0.6 0.7 1.0 1.2 0.8  PROT 8.0 7.6 7.1 8.1 8.1  ALBUMIN 3.1* 2.9* 2.7* 3.0* 3.0*   No results found for this basename: LIPASE, AMYLASE,  in the last 168 hours No results found for this basename: AMMONIA,  in the last 168 hours CBC:  Recent Labs Lab 06/11/14 0502 06/12/14 0400 06/13/14 0404 06/14/14 0409 06/15/14  0315  WBC 8.4 20.4* 14.6* 12.0* 9.8  NEUTROABS 6.9 19.8* 13.8* 10.6* 7.8*  HGB 10.0* 11.5* 11.0* 10.6* 10.6*  HCT 30.3* 34.8* 33.6* 33.3* 32.9*  MCV 90.4 89.5 92.1 91.7 91.9  PLT 210 215 225 198 186   Cardiac Enzymes:  Recent Labs Lab 06/09/14 2310 06/10/14 0400 06/10/14 1035  TROPONINI <0.30 <0.30 <0.30   BNP (last 3 results)  Recent Labs  09/10/13 1827 10/13/13 1916 06/09/14 1818  PROBNP 4050.0* 1519.0* 4358.0*   CBG:  Recent Labs Lab 06/14/14 0615 06/14/14 1151 06/14/14 1606 06/14/14 2132 06/15/14 0615  GLUCAP 206* 262* 201* 217* 147*    No results found for this or any previous visit (from the past 240 hour(s)).   Studies: No results found.  Scheduled Meds: . Brinzolamide-Brimonidine   1 drop Both Eyes BID  . heparin  5,000 Units Subcutaneous 3 times per day  . insulin aspart  0-15 Units Subcutaneous TID WC  . insulin aspart  0-5 Units Subcutaneous QHS  . insulin aspart  4 Units Subcutaneous TID WC  . insulin glargine  70 Units Subcutaneous BID  . levofloxacin (LEVAQUIN) IV  750 mg Intravenous Q48H  . levothyroxine  25 mcg Oral QAC breakfast  . metoprolol tartrate  25 mg Oral BID  . pregabalin  75 mg Oral QPM  . tiotropium  18 mcg Inhalation Daily   Continuous Infusions:    Marinda Elk  [facility] Pager [phone]. If 8PM-8AM, please contact night-coverage at [email or web address], password [facility] 06/15/2014, 10:45 AM  LOS: 6 days

## 2014-06-15 NOTE — Care Management Note (Signed)
    Page 1 of 1   06/15/2014     3:17:35 PM CARE MANAGEMENT NOTE 06/15/2014  Patient:  Kimberly [name],Kimberly [name]   Account Number:  [id number]  Date Initiated:  06/10/2014  Documentation initiated by:  Kimberly [name],Kimberly [name]  Subjective/Objective Assessment:   Patient was admitted with anasarca, chf, aki, acute respiratory failure.  Lives at home with spouse.     Action/Plan:   Will follow for discharge needs   Anticipated DC Date:  06/19/2014   Anticipated DC Plan:  HOME W HOME HEALTH SERVICES      DC Planning Services  CM consult      Choice offered to / List presented to:             Status of service:  In process, will continue to follow Medicare Important Message given?  YES (If response is "NO", the following Medicare IM given date fields will be blank) Date Medicare IM given:  06/15/2014 Medicare IM given by:  Kimberly [name],Kimberly [name] Date Additional Medicare IM given:   Additional Medicare IM given by:    Discharge Disposition:    Per UR Regulation:  Reviewed for med. necessity/level of care/duration of stay  If discussed at Long Length of Stay Meetings, dates discussed:   06/16/2014    Comments:  06/15/14 1500 Kimberly Papadopoulos, RN, BSN, [place] [phone] HF Team requested to assist with worsening renal function and massive volume overload. Diuretics have been held. GIven 500 cc NS bolus today. Will move to stepdown and place central access to further guide therapy. Start lasix drip 10 mg per hour with 80 mg bolus.

## 2014-06-15 NOTE — Consult Note (Addendum)
WOC wound consult note Reason for Consult: Consult requested for wound between left toes.  Assessed all toe webbing and spaces to bilat feet and no open wound or drainage noted.  Pt states she previously had pus draining from a site. All skin is intact and there are no areas of fluctuance when probed with a swab, no odor or drainage.   Dressing procedure/placement/frequency: No topical treatment needed at this time.  Discussed proper foot care with patient regarding keeping areas clean and dry, recommend follow-up with a podiatrist to trim toe neails which are long, dry, yellow and crumbling.  She verbalizes understanding. Please re-consult if further assistance is needed.  Thank-you,  Cammie Mcgeeawn Melquiades Kovar MSN, RN, CWOCN, [place], CNS [phone]

## 2014-06-15 NOTE — Progress Notes (Signed)
CRITICAL VALUE ALERT  Critical value received:  Positive MRSA swab, nares  Date of notification:  06/15/2014  Time of notification:  1617  Critical value read back:Yes.    Nurse who received alert:  Lavona MoundJamie Idy Rawling,RN   MD notified (1st page):  Dr. David StallFeliz Ortiz  Time of first page:  1617  MD notified (2nd page):  Time of second page:  Responding MD:  N/a. Notified Dr. David StallFeliz Ortiz via text page that I would implement the appropriate orders/precautions per protocol   Time MD responded:  n/a

## 2014-06-15 NOTE — Procedures (Signed)
Central Venous Catheter Insertion Procedure Note Kimberly [name] [id number] 1956/06/15  Procedure: Insertion of Central Venous Catheter Indications: Assessment of intravascular volume  Procedure Details Consent: Risks of procedure as well as the alternatives and risks of each were explained to the (patient/caregiver).  Consent for procedure obtained. Time Out: Verified patient identification, verified procedure, site/side was marked, verified correct patient position, special equipment/implants available, medications/allergies/relevent history reviewed, required imaging and test results available.  Performed  Maximum sterile technique was used including antiseptics, cap, gloves, gown, hand hygiene, mask and sheet. Skin prep: Chlorhexidine; local anesthetic administered A antimicrobial bonded/coated triple lumen catheter was placed in the right internal jugular vein using the Seldinger technique.  Evaluation Blood flow good Complications: No apparent complications Patient did tolerate procedure well. Chest X-ray ordered to verify placement.  CXR: pending.  Kimberly Meresaniel Bensimhon MD 06/15/2014, 5:53 PM

## 2014-06-15 NOTE — Progress Notes (Signed)
Pt.is A/Ox4 and she had no c/o pain during the shift. She is on 2.5 L [place] of oxygen with saturations 97-100%. Pt.wasn't showing signs of distress however it was determined by cardiology to have patient moved to stepdown for closer CVP monitoring. Pt.has foley in place with minimal output. She was started on a lasix drip prior to transport. Pt.was transported by bed with IV pump and oxygen and was accompanied by NT, charge RN, primary RN. Pt.personal wheelchair and belongings were brought down with patient.

## 2014-06-15 NOTE — Progress Notes (Signed)
PT Cancellation Note  Patient Details Name: Kimberly ManilaSusan K Ripp MRN: [id number] DOB: [birth year]   Cancelled Treatment:    Reason Eval/Treat Not Completed: Medical issues which prohibited therapy, pt transferring to step down for closer monitoring of pressure. Will hold PT today and check back tomorrow. Discussed with RN.   Mustafa Potts, [place] 06/15/2014, 12:57 PM

## 2014-06-15 NOTE — Progress Notes (Signed)
Patient Name: Kimberly [name] Date of Encounter: 06/15/2014  Principal Problem:   Anasarca Active Problems:   DM   HYPERLIPIDEMIA   TOBACCO ABUSE   COPD   Acute on chronic diastolic CHF (congestive heart failure), NYHA class 1   CKD (chronic kidney disease), stage III   AKI (acute kidney injury)   Unspecified hypothyroidism   Mitral valve stenosis, moderate   Pulmonary HTN   Length of Stay: 6  SUBJECTIVE  No dyspnea, but she is worried. UO only 650 mL yesterday, although 11L negative since admission Still has massive lower extremity edema, but renal function has deteriorated rapidly (creat 1.9--2.5--3.0)   CURRENT MEDS . Brinzolamide-Brimonidine  1 drop Both Eyes BID  . heparin  5,000 Units Subcutaneous 3 times per day  . insulin aspart  0-15 Units Subcutaneous TID WC  . insulin aspart  0-5 Units Subcutaneous QHS  . insulin aspart  4 Units Subcutaneous TID WC  . insulin glargine  70 Units Subcutaneous BID  . levofloxacin (LEVAQUIN) IV  750 mg Intravenous Q48H  . levothyroxine  25 mcg Oral QAC breakfast  . metoprolol tartrate  25 mg Oral BID  . pregabalin  75 mg Oral QPM  . tiotropium  18 mcg Inhalation Daily    OBJECTIVE   Intake/Output Summary (Last 24 hours) at 06/15/14 0936 Last data filed at 06/15/14 0901  Gross per 24 hour  Intake    720 ml  Output    650 ml  Net     70 ml   Filed Weights   06/13/14 0500 06/14/14 0428 06/15/14 0638  Weight: 144.7 kg (319 lb 0.1 oz) 142.1 kg (313 lb 4.4 oz) 144.2 kg (317 lb 14.5 oz)    PHYSICAL EXAM Filed Vitals:   06/14/14 1300 06/14/14 2128 06/15/14 0638 06/15/14 0846  BP: 117/69 116/72 117/48   Pulse: 100 98 82   Temp: 97.8 F (36.6 C) 97.4 F (36.3 C) 98.2 F (36.8 C)   TempSrc: Oral Oral Oral   Resp: 18 18 18    Height:      Weight:   144.2 kg (317 lb 14.5 oz)   SpO2: 100% 100% 100% 100%   General: Alert, oriented x3, no distress. Exam limited by obesity Head: no evidence of trauma, PERRL, EOMI, no  exophtalmos or lid lag, no myxedema, no xanthelasma; normal ears, nose and oropharynx Neck: normal jugular venous pulsations and no hepatojugular reflux; brisk carotid pulses without delay and no carotid bruits Chest: clear to auscultation, no signs of consolidation by percussion or palpation, normal fremitus, symmetrical and full respiratory excursions Cardiovascular: cannot locate the apical impulse, regular rhythm, normal first and split second heart sounds, no rubs or gallops, no murmur Abdomen: no tenderness or distention, no masses by palpation, no abnormal pulsatility or arterial bruits, normal bowel sounds Extremities: massive bilateral, chronic brawny edema; 2+ radial, ulnar and brachial pulses bilaterally; 2+ right femoral, cannot find posterior tibial and dorsalis pedis pulses; 2+ left femoral, cannot find posterior tibial and dorsalis pedis pulses; no subclavian or femoral bruits Neurological: grossly nonfocal  LABS  CBC  Recent Labs  06/14/14 0409 06/15/14 0315  WBC 12.0* 9.8  NEUTROABS 10.6* 7.8*  HGB 10.6* 10.6*  HCT 33.3* 32.9*  MCV 91.7 91.9  PLT 198 186   Basic Metabolic Panel  Recent Labs  06/14/14 0409 06/15/14 0315  NA 136* 137  K 4.0 4.1  CL 90* 91*  CO2 35* 35*  GLUCOSE 237* 162*  BUN 76* 89*  CREATININE 2.55* 3.08*  CALCIUM 9.4 8.9   Liver Function Tests  Recent Labs  06/13/14 0404  AST 29  ALT 11  ALKPHOS 127*  BILITOT 0.8  PROT 8.1  ALBUMIN 3.0*  Radiology Studies Imaging results have been reviewed and No results found.  TELE NSR  ECG NSR. LBBB  ASSESSMENT AND PLAN Principal Problem:   Anasarca Active Problems:   DM   HYPERLIPIDEMIA   TOBACCO ABUSE   COPD   Acute on chronic diastolic CHF (congestive heart failure), NYHA class 1   CKD (chronic kidney disease), stage III   AKI (acute kidney injury)   Unspecified hypothyroidism   Mitral valve stenosis, moderate   Pulmonary HTN   She had prominent symptoms of  biventricular failure on admission, but she no longer has orthopnea or dyspnea with moving. LVEF is normal Despite persistent massive lower extremity edema, she has developed rapid deterioration in renal function with diuresis. She has severe pulmonary arterial HTN by echo, out of proportion to the degree of left heart failure. He last echo shows mitral stenosis, which was not described on her echo from January (although mean gradient was 8 mm Hg then also). No known history of rheumatic fever.  Will review echo. If mitral stenosis is present she would benefit from a slower heart rate with beta blockers, maybe TEE for better evaluation. Might be a candidate for pulmonary vasodilator therapy (sildenafil or macitentan). Will probably recommend a right and left heart cath for pressures only. Recheck BNP to see if left heart failure "back at baseline".  Alternatively, her rapid renal deterioration may simply be a sign that her diabetic nephropathy is quite severe. PAH may be due to COPD (30 pack year smoker) and (suspected) untreated OSA.   Thurmon FairMihai Tashai Catino, MD, [facility] [phone] office [phone] pager 06/15/2014 9:36 AM

## 2014-06-16 ENCOUNTER — Inpatient Hospital Stay (HOSPITAL_COMMUNITY): Payer: Managed Care, Other (non HMO)

## 2014-06-16 LAB — BASIC METABOLIC PANEL
ANION GAP: 13 (ref 5–15)
BUN: 101 mg/dL — ABNORMAL HIGH (ref 6–23)
CHLORIDE: 90 meq/L — AB (ref 96–112)
CO2: 34 meq/L — AB (ref 19–32)
Calcium: 8.3 mg/dL — ABNORMAL LOW (ref 8.4–10.5)
Creatinine, Ser: 3.25 mg/dL — ABNORMAL HIGH (ref 0.50–1.10)
GFR calc Af Amer: 17 mL/min — ABNORMAL LOW (ref >90)
GFR calc non Af Amer: 15 mL/min — ABNORMAL LOW (ref >90)
Glucose, Bld: 207 mg/dL — ABNORMAL HIGH (ref 70–99)
Potassium: 3.9 mEq/L (ref 3.7–5.3)
Sodium: 137 mEq/L (ref 137–147)

## 2014-06-16 LAB — CBC WITH DIFFERENTIAL/PLATELET
Basophils Absolute: 0 10*3/uL (ref 0.0–0.1)
Basophils Relative: 0 % (ref 0–1)
EOS ABS: 0.3 10*3/uL (ref 0.0–0.7)
Eosinophils Relative: 3 % (ref 0–5)
HCT: 28.1 % — ABNORMAL LOW (ref 36.0–46.0)
Hemoglobin: 9.1 g/dL — ABNORMAL LOW (ref 12.0–15.0)
Lymphocytes Relative: 7 % — ABNORMAL LOW (ref 12–46)
Lymphs Abs: 0.7 10*3/uL (ref 0.7–4.0)
MCH: 29.4 pg (ref 26.0–34.0)
MCHC: 32.4 g/dL (ref 30.0–36.0)
MCV: 90.6 fL (ref 78.0–100.0)
Monocytes Absolute: 0.6 10*3/uL (ref 0.1–1.0)
Monocytes Relative: 6 % (ref 3–12)
Neutro Abs: 8.2 10*3/uL — ABNORMAL HIGH (ref 1.7–7.7)
Neutrophils Relative %: 84 % — ABNORMAL HIGH (ref 43–77)
PLATELETS: 172 10*3/uL (ref 150–400)
RBC: 3.1 MIL/uL — AB (ref 3.87–5.11)
RDW: 14.8 % (ref 11.5–15.5)
WBC: 9.8 10*3/uL (ref 4.0–10.5)

## 2014-06-16 LAB — CARBOXYHEMOGLOBIN
CARBOXYHEMOGLOBIN: 1.6 % — AB (ref 0.5–1.5)
Methemoglobin: 0.8 % (ref 0.0–1.5)
O2 Saturation: 73.7 %
Total hemoglobin: 9.4 g/dL — ABNORMAL LOW (ref 12.0–16.0)

## 2014-06-16 LAB — GLUCOSE, CAPILLARY
GLUCOSE-CAPILLARY: 189 mg/dL — AB (ref 70–99)
Glucose-Capillary: 197 mg/dL — ABNORMAL HIGH (ref 70–99)
Glucose-Capillary: 153 mg/dL — ABNORMAL HIGH (ref 70–99)
Glucose-Capillary: 127 mg/dL — ABNORMAL HIGH (ref 70–99)

## 2014-06-16 MED ORDER — INSULIN ASPART 100 UNIT/ML ~~LOC~~ SOLN
4.0000 [IU] | Freq: Three times a day (TID) | SUBCUTANEOUS | Status: DC
  Administered 2014-06-16 – 2014-06-22 (×13): 4 [IU] via SUBCUTANEOUS

## 2014-06-16 MED ORDER — MORPHINE SULFATE 2 MG/ML IJ SOLN
1.0000 mg | INTRAMUSCULAR | Status: DC | PRN
  Administered 2014-06-16: 2 mg via INTRAVENOUS
  Administered 2014-06-19 – 2014-06-20 (×3): 4 mg via INTRAVENOUS
  Filled 2014-06-16: qty 2
  Filled 2014-06-16 (×3): qty 1
  Filled 2014-06-16 (×2): qty 2

## 2014-06-16 MED ORDER — INSULIN ASPART 100 UNIT/ML ~~LOC~~ SOLN
0.0000 [IU] | Freq: Three times a day (TID) | SUBCUTANEOUS | Status: DC
  Administered 2014-06-16 (×3): 3 [IU] via SUBCUTANEOUS

## 2014-06-16 MED ORDER — POTASSIUM CHLORIDE CRYS ER 20 MEQ PO TBCR
20.0000 meq | EXTENDED_RELEASE_TABLET | Freq: Once | ORAL | Status: AC
  Administered 2014-06-16: 20 meq via ORAL
  Filled 2014-06-16: qty 1

## 2014-06-16 MED ORDER — METOLAZONE 5 MG PO TABS
5.0000 mg | ORAL_TABLET | Freq: Two times a day (BID) | ORAL | Status: AC
  Administered 2014-06-16 (×2): 5 mg via ORAL
  Filled 2014-06-16 (×2): qty 1

## 2014-06-16 MED ORDER — NYSTATIN 100000 UNIT/GM EX POWD
Freq: Three times a day (TID) | CUTANEOUS | Status: DC
  Administered 2014-06-16 (×2): via TOPICAL
  Administered 2014-06-16: 1 via TOPICAL
  Administered 2014-06-17 – 2014-06-24 (×21): via TOPICAL
  Administered 2014-06-25 (×2): 1 via TOPICAL
  Administered 2014-06-25 – 2014-07-01 (×16): via TOPICAL
  Administered 2014-07-01: 1 via TOPICAL
  Administered 2014-07-01 – 2014-07-05 (×10): via TOPICAL
  Filled 2014-06-16 (×4): qty 15

## 2014-06-16 NOTE — Progress Notes (Signed)
Inpatient Diabetes Program Recommendations  AACE/ADA: New Consensus Statement on Inpatient Glycemic Control (2013)  Target Ranges:  Prepandial:   less than 140 mg/dL      Peak postprandial:   less than 180 mg/dL (1-2 hours)      Critically ill patients:  140 - 180 mg/dL   Results for Kimberly [name], Kimberly [name] (MRN [id number]) as of 06/16/2014 09:19  Ref. Range 06/15/2014 06:15 06/15/2014 11:09 06/15/2014 16:49 06/15/2014 21:51 06/16/2014 08:07  Glucose-Capillary Latest Range: 70-99 mg/dL 657147 (H) 846159 (H) 962167 (H) 260 (H) 197 (H)    Diabetes history: DM2   Outpatient Diabetes medications: Lantus 55 units BID and Novolog 10-60 units TID with meals (if CBG is at least 100 mg/dl she takes 10 units and adds 1 unit for every 10 mg/dl)   Current orders for Inpatient glycemic control: Lantus 70 units BID, Novolog 0-15 units AC, Novolog 0-5 units HS, Novolog 4 units TID with meals    Meal Coverage: Please consider increasing meal coverage to Novolog 6 units TID with meals if patient eats at least 50% of meal.  Susette RacerJulie Arelene Moroni, RN, BA, [place], CDE Diabetes Coordinator Inpatient Diabetes Program  [phone] (Team Pager) [phone] Patrcia Dolly([place] Office) 06/16/2014 9:20 AM

## 2014-06-16 NOTE — Progress Notes (Signed)
[facility] PROGRESS NOTE  Assessment/Plan: Acute on chronic diastolic CHF exacerbation  - I/O cont to be negative. - Unknown dry weight - Consulted HF team, started on milrinone and lasix GGT.Marland Kitchen.  Candida intertrigo: - nystatin powder.  Acute on chronic kidney disease stage III: - Baseline Cr 1.6, increase in Cr most likely due to HF. - Chronic component, Secondary to uncontrol diabetes and hypertension.  - Acute component due to vol overload. Renal U/S no good windows  New onset hypothyroidism: - TSH on admission was 18.36. No prior history of hypothyroidism.  - Started synthroid. - Repeat TSH in 6 weeks.  Hyponatremia  - Likely secondary to a hypervolemic hyponatremia - Resolved with diuresis.  Chronic respiratory failure/COPD  Stable. Follow.  - will need Sleep study.  Diabetes mellitus  - Cont lantusLantus. Start moderate scale SSI.  Prophylaxis  Heparin for DVT prophylaxis.   Code Status: Full  Family Communication: Updated patient no family at bedside.  Disposition Plan: SNF    Consultants:  none  Procedures:  Renal U/s - 2-D echo has been obtained that shows grade 2 diastolic dysfunction with EF of 55-60% with no wall motion abnormalities. - Renal function trending down.   Antibiotics:  None  HPI/Subjective: No complains  Objective: Filed Vitals:   06/16/14 0500 06/16/14 0600 06/16/14 0603 06/16/14 0700  BP: 121/52  103/29 121/32  Pulse: 97 99 99 100  Temp:      TempSrc:      Resp: 24 23 18 18   Height:      Weight: 137.893 kg (304 lb)     SpO2: 99% 96% 81% 94%    Intake/Output Summary (Last 24 hours) at 06/16/14 0738 Last data filed at 06/16/14 0600  Gross per 24 hour  Intake 1252.07 ml  Output   1475 ml  Net -222.93 ml   Filed Weights   06/14/14 0428 06/15/14 0638 06/16/14 0500  Weight: 142.1 kg (313 lb 4.4 oz) 144.2 kg (317 lb 14.5 oz) 137.893 kg (304 lb)    Exam:  General: Alert, awake, oriented x3, in no acute  distress.  HEENT: No bruits, no goiter.  Heart: Regular rate and rhythm. Lungs: Good air movement, clear Abdomen: Soft, nontender, edema on her sacrum  Data Reviewed: Basic Metabolic Panel:  Recent Labs Lab 06/13/14 0404 06/14/14 0409 06/15/14 0315 06/15/14 2210 06/16/14 0450  NA 132* 136* 137 135* 137  K 4.5 4.0 4.1 4.1 3.9  CL 86* 90* 91* 90* 90*  CO2 32 35* 35* 34* 34*  GLUCOSE 524* 237* 162* 238* 207*  BUN 66* 76* 89* 98* 101*  CREATININE 1.95* 2.55* 3.08* 3.20* 3.25*  CALCIUM 9.4 9.4 8.9 8.4 8.3*   Liver Function Tests:  Recent Labs Lab 06/09/14 1818 06/10/14 0400 06/11/14 0502 06/12/14 0400 06/13/14 0404  AST 15 17 13 16 29   ALT 11 10 8 9 11   ALKPHOS 159* 149* 133* 152* 127*  BILITOT 0.6 0.7 1.0 1.2 0.8  PROT 8.0 7.6 7.1 8.1 8.1  ALBUMIN 3.1* 2.9* 2.7* 3.0* 3.0*   No results found for this basename: LIPASE, AMYLASE,  in the last 168 hours No results found for this basename: AMMONIA,  in the last 168 hours CBC:  Recent Labs Lab 06/12/14 0400 06/13/14 0404 06/14/14 0409 06/15/14 0315 06/16/14 0450  WBC 20.4* 14.6* 12.0* 9.8 9.8  NEUTROABS 19.8* 13.8* 10.6* 7.8* 8.2*  HGB 11.5* 11.0* 10.6* 10.6* 9.1*  HCT 34.8* 33.6* 33.3* 32.9* 28.1*  MCV 89.5 92.1 91.7  91.9 90.6  PLT 215 225 198 186 172   Cardiac Enzymes:  Recent Labs Lab 06/09/14 2310 06/10/14 0400 06/10/14 1035  TROPONINI <0.30 <0.30 <0.30   BNP (last 3 results)  Recent Labs  09/10/13 1827 10/13/13 1916 06/09/14 1818  PROBNP 4050.0* 1519.0* 4358.0*   CBG:  Recent Labs Lab 06/14/14 2132 06/15/14 0615 06/15/14 1109 06/15/14 1649 06/15/14 2151  GLUCAP 217* 147* 159* 167* 260*    Recent Results (from the past 240 hour(s))  MRSA PCR SCREENING     Status: Abnormal   Collection Time    06/15/14  2:30 PM      Result Value Ref Range Status   MRSA by PCR POSITIVE (*) NEGATIVE Final   Comment:            The GeneXpert MRSA Assay (FDA     approved for NASAL specimens      only), is one component of a     comprehensive MRSA colonization     surveillance program. It is not     intended to diagnose MRSA     infection nor to guide or     monitor treatment for     MRSA infections.     RESULT CALLED TO, READ BACK BY AND VERIFIED WITH:     Harriett Rush RN 16:10 06/15/14 ([email or web address])     Studies: Dg Chest Port 1 View  06/15/2014   CLINICAL DATA:  Readjustment of right central line.  EXAM: PORTABLE CHEST - 1 VIEW  COMPARISON:  06/15/2014  FINDINGS: The right IJ catheter tip is in the projection of the cavoatrial junction. Stable cardiac enlargement and pulmonary vascular congestion. No pleural effusion or edema identified.  IMPRESSION: 1. The tip of the IJ catheter is in the projection of the cavoatrial junction. No pneumothorax identified after central line placement.   Electronically Signed   By: Signa Kell M.D.   On: 06/15/2014 18:32   Dg Chest Port 1 View  06/15/2014   CLINICAL DATA:  Right central line placement.  EXAM: PORTABLE CHEST - 1 VIEW  COMPARISON:  Earlier today.  FINDINGS: The previously demonstrated right jugular catheter is now curved back upon itself in the distal aspect of the superior vena cava. No pneumothorax. The cardiac silhouette remains mildly enlarged. Diffuse peribronchial thickening is unchanged. The lungs remain clear. Unremarkable bones.  IMPRESSION: 1. Right jugular catheter curved back upon itself in the distal superior vena cava. 2. Stable mild cardiomegaly and mild chronic bronchitic changes.   Electronically Signed   By: Gordan Payment M.D.   On: 06/15/2014 18:28    Scheduled Meds: . antiseptic oral rinse  7 mL Mouth Rinse BID  . Brinzolamide-Brimonidine  1 drop Both Eyes BID  . Chlorhexidine Gluconate Cloth  6 each Topical Q0600  . heparin  5,000 Units Subcutaneous 3 times per day  . insulin aspart  0-15 Units Subcutaneous TID WC  . insulin aspart  0-5 Units Subcutaneous QHS  . insulin aspart  4 Units Subcutaneous TID WC  . insulin  glargine  70 Units Subcutaneous BID  . levothyroxine  25 mcg Oral QAC breakfast  . metoprolol tartrate  25 mg Oral BID  . mupirocin ointment  1 application Nasal BID  . potassium chloride  20 mEq Oral Once  . pregabalin  75 mg Oral QPM  . tiotropium  18 mcg Inhalation Daily   Continuous Infusions: . DOPamine Stopped (06/16/14 0100)  . furosemide (LASIX) infusion 15 mg/hr (06/15/14 1812)  .  milrinone 0.25 mcg/kg/min (06/16/14 0123)     Marinda Elk  [facility] Pager [phone]. If 8PM-8AM, please contact night-coverage at [email or web address], password [email or web address] 06/16/2014, 7:38 AM  LOS: 7 days

## 2014-06-16 NOTE — Progress Notes (Addendum)
Advanced Heart Failure Rounding Note   Subjective:   Admitted with shortness of breath and volume overload. Diuresed with IV lasix with worsening renal function. Transferred to stepdown with central line placed. Initial CO-OX was 66%. Placed on lasix at 15 mg per hour. Later dopamine started however she had chest tightness so it was discontinued and milrinone was started.  24 hour I/O -222 cc.     Renal Ultrasound - inconclusive ECHO 06/10/14 EF 55-60% Grade II DD MV moderate stenosis. Peak PA pressure 77  Creatinine 3.25  TSH 18.36   Objective:   Weight Range:  Vital Signs:   Temp:  [98.2 F (36.8 C)-98.9 F (37.2 C)] 98.9 F (37.2 C) (10/06 0752) Pulse Rate:  [71-106] 96 (10/06 0800) Resp:  [11-29] 24 (10/06 0800) BP: (98-158)/(25-52) 113/25 mmHg (10/06 0800) SpO2:  [81 %-100 %] 95 % (10/06 0800) Weight:  [161.096 kg (304 lb)] 137.893 kg (304 lb) (10/06 0500) Last BM Date: 06/15/14  Weight change: Filed Weights   06/14/14 0428 06/15/14 0638 06/16/14 0500  Weight: 142.1 kg (313 lb 4.4 oz) 144.2 kg (317 lb 14.5 oz) 137.893 kg (304 lb)    Intake/Output:   Intake/Output Summary (Last 24 hours) at 06/16/14 0907 Last data filed at 06/16/14 0750  Gross per 24 hour  Intake 1012.07 ml  Output   1565 ml  Net -552.93 ml     Physical Exam: CVP ~20 General:  Well appearing. No resp difficulty HEENT: normal Neck: supple. RIJ TLC. JVP to jaw . Carotids 2+ bilat; no bruits. No lymphadenopathy or thryomegaly appreciated. RIJ  Cor: PMI nondisplaced. Regular rate & rhythm. No rubs, gallops or murmurs. Lungs: clear Abdomen: obese, 4+ edema, nontender, +distended. No hepatosplenomegaly. No bruits or masses. Good bowel sounds. Rash under pannus  Extremities: no cyanosis, clubbing, rash, R and LLE 4+ edema extending to thighs.  Neuro: alert & orientedx3, cranial nerves grossly intact. moves all 4 extremities w/o difficulty. Affect pleasant  Telemetry: SR 90s   Labs: Basic  Metabolic Panel:  Recent Labs Lab 06/13/14 0404 06/14/14 0409 06/15/14 0315 06/15/14 2210 06/16/14 0450  NA 132* 136* 137 135* 137  K 4.5 4.0 4.1 4.1 3.9  CL 86* 90* 91* 90* 90*  CO2 32 35* 35* 34* 34*  GLUCOSE 524* 237* 162* 238* 207*  BUN 66* 76* 89* 98* 101*  CREATININE 1.95* 2.55* 3.08* 3.20* 3.25*  CALCIUM 9.4 9.4 8.9 8.4 8.3*    Liver Function Tests:  Recent Labs Lab 06/09/14 1818 06/10/14 0400 06/11/14 0502 06/12/14 0400 06/13/14 0404  AST 15 17 13 16 29   ALT 11 10 8 9 11   ALKPHOS 159* 149* 133* 152* 127*  BILITOT 0.6 0.7 1.0 1.2 0.8  PROT 8.0 7.6 7.1 8.1 8.1  ALBUMIN 3.1* 2.9* 2.7* 3.0* 3.0*   No results found for this basename: LIPASE, AMYLASE,  in the last 168 hours No results found for this basename: AMMONIA,  in the last 168 hours  CBC:  Recent Labs Lab 06/12/14 0400 06/13/14 0404 06/14/14 0409 06/15/14 0315 06/16/14 0450  WBC 20.4* 14.6* 12.0* 9.8 9.8  NEUTROABS 19.8* 13.8* 10.6* 7.8* 8.2*  HGB 11.5* 11.0* 10.6* 10.6* 9.1*  HCT 34.8* 33.6* 33.3* 32.9* 28.1*  MCV 89.5 92.1 91.7 91.9 90.6  PLT 215 225 198 186 172    Cardiac Enzymes:  Recent Labs Lab 06/09/14 2310 06/10/14 0400 06/10/14 1035  TROPONINI <0.30 <0.30 <0.30    BNP: BNP (last 3 results)  Recent Labs  09/10/13 1827  10/13/13 1916 06/09/14 1818  PROBNP 4050.0* 1519.0* 4358.0*     Other results:    Imaging: Dg Chest Port 1 View  06/15/2014   CLINICAL DATA:  Readjustment of right central line.  EXAM: PORTABLE CHEST - 1 VIEW  COMPARISON:  06/15/2014  FINDINGS: The right IJ catheter tip is in the projection of the cavoatrial junction. Stable cardiac enlargement and pulmonary vascular congestion. No pleural effusion or edema identified.  IMPRESSION: 1. The tip of the IJ catheter is in the projection of the cavoatrial junction. No pneumothorax identified after central line placement.   Electronically Signed   By: Signa Kellaylor  Stroud M.D.   On: 06/15/2014 18:32   Dg Chest  Port 1 View  06/15/2014   CLINICAL DATA:  Right central line placement.  EXAM: PORTABLE CHEST - 1 VIEW  COMPARISON:  Earlier today.  FINDINGS: The previously demonstrated right jugular catheter is now curved back upon itself in the distal aspect of the superior vena cava. No pneumothorax. The cardiac silhouette remains mildly enlarged. Diffuse peribronchial thickening is unchanged. The lungs remain clear. Unremarkable bones.  IMPRESSION: 1. Right jugular catheter curved back upon itself in the distal superior vena cava. 2. Stable mild cardiomegaly and mild chronic bronchitic changes.   Electronically Signed   By: Gordan PaymentSteve  Reid M.D.   On: 06/15/2014 18:28     Medications:     Scheduled Medications: . antiseptic oral rinse  7 mL Mouth Rinse BID  . Brinzolamide-Brimonidine  1 drop Both Eyes BID  . Chlorhexidine Gluconate Cloth  6 each Topical Q0600  . heparin  5,000 Units Subcutaneous 3 times per day  . insulin aspart  0-15 Units Subcutaneous TID WC  . insulin aspart  0-5 Units Subcutaneous QHS  . insulin aspart  4 Units Subcutaneous TID WC  . insulin glargine  70 Units Subcutaneous BID  . levothyroxine  25 mcg Oral QAC breakfast  . metoprolol tartrate  25 mg Oral BID  . mupirocin ointment  1 application Nasal BID  . nystatin   Topical TID  . potassium chloride  20 mEq Oral Once  . pregabalin  75 mg Oral QPM  . tiotropium  18 mcg Inhalation Daily    Infusions: . DOPamine Stopped (06/16/14 0100)  . furosemide (LASIX) infusion 15 mg/hr (06/15/14 1812)  . milrinone 0.25 mcg/kg/min (06/16/14 0123)    PRN Medications: albuterol, methocarbamol, morphine injection, oxyCODONE, sorbitol   Assessment:  1. A/C Diastolic HF ECHO EF 55-60%  2. Mitral Valve Stenosis 3. Obesity 4. DM --> Hgb A1C 7 5. Cardio Renal Syndrome 6. Hypothyroidism --> TSH 18.36  7. Immobility  8. Elevated PA pressure 77 mm hg 9. HTN      Plan/Discussion:    Still with volume overload. Continue lasix drip at 15  mg + milrinone 0.25 mcg. Renal function unchanged. Had renal ultrasound was completed but inconclusive due to obesity. Plan to RHC after she is fully diuresed.   Continue oxygen 2 liter [place]. Will need outpatient sleep study. Likely has sleep apnea.   Newly diagnosed hypothyroidism. On synthroid.   PT to follow up today. Likely need SNF.   Amy Filbert Schilderlegg, NP-C Length of Stay: 7  Patient seen and examined with [name], NP. We discussed all aspects of the encounter. I agree with the assessment and plan as stated above.   RIJ placed. Started on lasix gtt. Co-ox ok but still not diuresing despite massive volume overload. Unable to tolerate dopamine. Milrinone added. Not diuresing well. CVP  20. Creatinine stabilizing. BUN 100.   No indications for HD at this time. Suspect she may have component of ATN. No indications for HD at this point. Will add metolazone. Can consider UF, if not responding.   Will need RHC once diuresed,   [name] MD  06/16/2014, 9:07 AM  Advanced Heart Failure Team Pager [phone] (M-F; 7a - 4p)  Please contact [facility] for night-coverage after hours (4p -7a ) and weekends on [email or web address]

## 2014-06-16 NOTE — Progress Notes (Signed)
Shortly after Dopamine started pt began complaining of chest tightness 6/10 and SOB accompanied by arm "heaviness" . Physician notified and Dopamine drip stopped. About a half hour after pt states her tightness has gone down to a 2/10. Arms feel lighter. Continues to report SOB. Respiratory paged for a breathing treatment. Pt AOx4. Able to follow commands. Will continue to monitor closely.

## 2014-06-16 NOTE — Progress Notes (Signed)
Advanced Heart Failure Rounding Note   Subjective:   Admitted with shortness of breath and volume overload. Diuresed with IV lasix with worsening renal function. Transferred to stepdown with central line placed. Initial CO-OX was 66%. Placed on lasix at 15 mg per hour. Later dopamine started however she had chest tightness so it was discontinued and milrinone was started.  24 hour I/O -222 cc.     Renal Ultrasound - inconclusive ECHO 06/10/14 EF 55-60% Grade II DD MV moderate stenosis. Peak PA pressure 77  Creatinine 3.25  TSH 18.36   Objective:   Weight Range:  Vital Signs:   Temp:  [98.2 F (36.8 C)-98.8 F (37.1 C)] 98.8 F (37.1 C) (10/06 0358) Pulse Rate:  [71-106] 99 (10/06 0603) Resp:  [11-29] 18 (10/06 0603) BP: (98-158)/(26-52) 103/29 mmHg (10/06 0603) SpO2:  [81 %-100 %] 81 % (10/06 0603) Weight:  [304 lb (137.893 kg)] 304 lb (137.893 kg) (10/06 0500) Last BM Date: 06/14/14  Weight change: Filed Weights   06/14/14 0428 06/15/14 0638 06/16/14 0500  Weight: 313 lb 4.4 oz (142.1 kg) 317 lb 14.5 oz (144.2 kg) 304 lb (137.893 kg)    Intake/Output:   Intake/Output Summary (Last 24 hours) at 06/16/14 0718 Last data filed at 06/16/14 0600  Gross per 24 hour  Intake 1252.07 ml  Output   1475 ml  Net -222.93 ml     Physical Exam: CVP ~8 General:  Well appearing. No resp difficulty HEENT: normal Neck: supple. JVP to jaw . Carotids 2+ bilat; no bruits. No lymphadenopathy or thryomegaly appreciated. RIJ  Cor: PMI nondisplaced. Regular rate & rhythm. No rubs, gallops or murmurs. Lungs: clear Abdomen: obese, 3+ edema, nontender, nondistended. No hepatosplenomegaly. No bruits or masses. Good bowel sounds. Rash under pannus  Extremities: no cyanosis, clubbing, rash, R and LLE 3+ edema extending to thighs.  Neuro: alert & orientedx3, cranial nerves grossly intact. moves all 4 extremities w/o difficulty. Affect pleasant  Telemetry: SR 90s   Labs: Basic Metabolic  Panel:  Recent Labs Lab 06/13/14 0404 06/14/14 0409 06/15/14 0315 06/15/14 2210 06/16/14 0450  NA 132* 136* 137 135* 137  K 4.5 4.0 4.1 4.1 3.9  CL 86* 90* 91* 90* 90*  CO2 32 35* 35* 34* 34*  GLUCOSE 524* 237* 162* 238* 207*  BUN 66* 76* 89* 98* 101*  CREATININE 1.95* 2.55* 3.08* 3.20* 3.25*  CALCIUM 9.4 9.4 8.9 8.4 8.3*    Liver Function Tests:  Recent Labs Lab 06/09/14 1818 06/10/14 0400 06/11/14 0502 06/12/14 0400 06/13/14 0404  AST 15 17 13 16 29   ALT 11 10 8 9 11   ALKPHOS 159* 149* 133* 152* 127*  BILITOT 0.6 0.7 1.0 1.2 0.8  PROT 8.0 7.6 7.1 8.1 8.1  ALBUMIN 3.1* 2.9* 2.7* 3.0* 3.0*   No results found for this basename: LIPASE, AMYLASE,  in the last 168 hours No results found for this basename: AMMONIA,  in the last 168 hours  CBC:  Recent Labs Lab 06/12/14 0400 06/13/14 0404 06/14/14 0409 06/15/14 0315 06/16/14 0450  WBC 20.4* 14.6* 12.0* 9.8 9.8  NEUTROABS 19.8* 13.8* 10.6* 7.8* 8.2*  HGB 11.5* 11.0* 10.6* 10.6* 9.1*  HCT 34.8* 33.6* 33.3* 32.9* 28.1*  MCV 89.5 92.1 91.7 91.9 90.6  PLT 215 225 198 186 172    Cardiac Enzymes:  Recent Labs Lab 06/09/14 2310 06/10/14 0400 06/10/14 1035  TROPONINI <0.30 <0.30 <0.30    BNP: BNP (last 3 results)  Recent Labs  09/10/13 1827 10/13/13 1916  06/09/14 1818  PROBNP 4050.0* 1519.0* 4358.0*     Other results:    Imaging: Dg Chest Port 1 View  06/15/2014   CLINICAL DATA:  Readjustment of right central line.  EXAM: PORTABLE CHEST - 1 VIEW  COMPARISON:  06/15/2014  FINDINGS: The right IJ catheter tip is in the projection of the cavoatrial junction. Stable cardiac enlargement and pulmonary vascular congestion. No pleural effusion or edema identified.  IMPRESSION: 1. The tip of the IJ catheter is in the projection of the cavoatrial junction. No pneumothorax identified after central line placement.   Electronically Signed   By: Signa Kell M.D.   On: 06/15/2014 18:32   Dg Chest Port 1  View  06/15/2014   CLINICAL DATA:  Right central line placement.  EXAM: PORTABLE CHEST - 1 VIEW  COMPARISON:  Earlier today.  FINDINGS: The previously demonstrated right jugular catheter is now curved back upon itself in the distal aspect of the superior vena cava. No pneumothorax. The cardiac silhouette remains mildly enlarged. Diffuse peribronchial thickening is unchanged. The lungs remain clear. Unremarkable bones.  IMPRESSION: 1. Right jugular catheter curved back upon itself in the distal superior vena cava. 2. Stable mild cardiomegaly and mild chronic bronchitic changes.   Electronically Signed   By: Gordan Payment M.D.   On: 06/15/2014 18:28      Medications:     Scheduled Medications: . antiseptic oral rinse  7 mL Mouth Rinse BID  . Brinzolamide-Brimonidine  1 drop Both Eyes BID  . Chlorhexidine Gluconate Cloth  6 each Topical Q0600  . heparin  5,000 Units Subcutaneous 3 times per day  . insulin aspart  0-15 Units Subcutaneous TID WC  . insulin aspart  0-5 Units Subcutaneous QHS  . insulin aspart  4 Units Subcutaneous TID WC  . insulin glargine  70 Units Subcutaneous BID  . levofloxacin (LEVAQUIN) IV  750 mg Intravenous Q48H  . levothyroxine  25 mcg Oral QAC breakfast  . metoprolol tartrate  25 mg Oral BID  . mupirocin ointment  1 application Nasal BID  . pregabalin  75 mg Oral QPM  . tiotropium  18 mcg Inhalation Daily     Infusions: . DOPamine Stopped (06/16/14 0100)  . furosemide (LASIX) infusion 15 mg/hr (06/15/14 1812)  . milrinone 0.25 mcg/kg/min (06/16/14 0123)     PRN Medications:  albuterol, methocarbamol, morphine injection, oxyCODONE, sorbitol   Assessment:  1. A/C Diastolic HF ECHO EF 55-60%  2. Mitral Valve Stenosis 3. Obesity 4. DM --> Hgb A1C 7 5. Cardio Renal Syndrome 6. Hypothyroidism --> TSH 18.36  7. Immobility  8. Elevated PA pressure 77 mm hg 9. HTN      Plan/Discussion:   Still with volume overload. Continue lasix drip at 15 mg +  milrinone 0.25 mcg. Renal function unchanged. Had renal ultrasound was completed but inconclusive due to obesity. Plan to RHC after she is fully diuresed.   Continue oxygen 2 liter [place]. Will need outpatient sleep study. Likely has sleep apnea.   Newly diagnosed hypothyroidism. On synthroid.   PT to follow up today. Likely need SNF.   Length of Stay: 7   CLEGG,AMY NP-C  06/16/2014, 7:18 AM  Advanced Heart Failure Team Pager [phone] (M-F; 7a - 4p)  Please contact [facility] for night-coverage after hours (4p -7a ) and weekends on [email or web address]  Agree. See my note as well.  Baily Hovanec,MD 1:50 PM

## 2014-06-16 NOTE — Progress Notes (Signed)
Physical Therapy Treatment Patient Details Name: Kimberly ManilaSusan K Mangold MRN: [id number] DOB: [birth year] Today's Date: 06/16/2014    History of Present Illness pt presents with Anasarca and SOB.      PT Comments    Pt progressing very slowly towards physical therapy goals. Per nursing, +3 assist was required for bed>chair this morning. Pt with increased difficulty moving in chair, and was unable to perform any scooting or mobility with therapist alone. Huntley DecSara Plus was attempted however pt could not tolerate knee flexion or adduction to put feet flat on the platform and lower legs against the front plate for safety. Recommended use of lift with nursing to get pt back to bed. Pt was educated on benefits of HEP and encouraged pt to perform 2x/day.  Follow Up Recommendations  SNF;Supervision for mobility/OOB     Equipment Recommendations  None recommended by PT    Recommendations for Other Services       Precautions / Restrictions Precautions Precautions: Fall Precaution Comments: O2 depednednt at home.   Restrictions Weight Bearing Restrictions: No    Mobility  Bed Mobility               General bed mobility comments: Pt sitting up in recliner upon PT arrival. Pt with increased difficulty scooting forward or backwards in chair. Therapist attempted to assist pt with bed pad to scoot, however pt was unable to effectively move in the chair.   Transfers Overall transfer level: Needs assistance Equipment used: Ambulation equipment used Huntley Dec(Sara Plus) Transfers: Sit to/from Stand Sit to Stand: Total assist;+2 physical assistance         General transfer comment: Pt unable to attempt transfer to standing from low recliner chair. Huntley DecSara Plus was used to attempt as well, and pt could not tolerate bending her knees enough to place feet flat on platform. Could pull up on handles of Huntley DecSara to place belt around her waist, however was not able to attempt any more mobility.  Ambulation/Gait                  Stairs            Wheelchair Mobility    Modified Rankin (Stroke Patients Only)       Balance Overall balance assessment: Needs assistance Sitting-balance support: Feet supported;Bilateral upper extremity supported Sitting balance-Leahy Scale: Poor Sitting balance - Comments: Pt with difficulty sitting upright in the recliner without assistance.                             Cognition Arousal/Alertness: Awake/alert Behavior During Therapy: WFL for tasks assessed/performed Overall Cognitive Status: Within Functional Limits for tasks assessed                      Exercises Discussed HEP consisting of LAQ, quad sets, ankle pumps, heel slides. x10 each.      General Comments        Pertinent Vitals/Pain Pain Assessment: Faces Faces Pain Scale: Hurts little more Pain Location: bilateral knees Pain Intervention(s): Limited activity within patient's tolerance;Monitored during session    Home Living                      Prior Function            PT Goals (current goals can now be found in the care plan section) Acute Rehab PT Goals Patient Stated Goal: To "stretch out those tendons" -  indicating her legs are tight from inactivity. PT Goal Formulation: With patient Time For Goal Achievement: 06/26/14 Potential to Achieve Goals: Good Progress towards PT goals: Not progressing toward goals - comment    Frequency  Min 2X/week    PT Plan Current plan remains appropriate    Co-evaluation             End of Session Equipment Utilized During Treatment: Gait belt;Oxygen Activity Tolerance: Patient limited by fatigue Patient left: in chair;with call bell/phone within reach     Time: 1142-1215 PT Time Calculation (min): 33 min  Charges:  $Therapeutic Activity: 23-37 mins                    G Codes:      Conni Slipper July 04, 2014, 3:06 PM

## 2014-06-16 NOTE — Progress Notes (Signed)
While transferring pt from chair to bed pt's central line was found to be pulled out a bit. Pt's milrinone and lasix were stopped and STAT CXR ordered and confirmed line was not in place. CCM called and verified finding. Paged [facility] to inform of finding. Cardiology responded first and orders given to replace line and restart milrinone peripherally. Milrinone restarted and CCM called. Will continue to monitor at this time.

## 2014-06-17 ENCOUNTER — Inpatient Hospital Stay (HOSPITAL_COMMUNITY): Payer: Managed Care, Other (non HMO)

## 2014-06-17 DIAGNOSIS — N179 Acute kidney failure, unspecified: Secondary | ICD-10-CM | POA: Diagnosis not present

## 2014-06-17 DIAGNOSIS — E039 Hypothyroidism, unspecified: Secondary | ICD-10-CM

## 2014-06-17 DIAGNOSIS — N189 Chronic kidney disease, unspecified: Secondary | ICD-10-CM

## 2014-06-17 LAB — BASIC METABOLIC PANEL
Anion gap: 10 (ref 5–15)
BUN: 105 mg/dL — AB (ref 6–23)
CO2: 37 mEq/L — ABNORMAL HIGH (ref 19–32)
CREATININE: 3.22 mg/dL — AB (ref 0.50–1.10)
Calcium: 8.9 mg/dL (ref 8.4–10.5)
Chloride: 90 mEq/L — ABNORMAL LOW (ref 96–112)
GFR calc non Af Amer: 15 mL/min — ABNORMAL LOW (ref >90)
GFR, EST AFRICAN AMERICAN: 17 mL/min — AB (ref >90)
Glucose, Bld: 59 mg/dL — ABNORMAL LOW (ref 70–99)
Potassium: 3.7 mEq/L (ref 3.7–5.3)
Sodium: 137 mEq/L (ref 137–147)

## 2014-06-17 LAB — GLUCOSE, CAPILLARY
GLUCOSE-CAPILLARY: 70 mg/dL (ref 70–99)
GLUCOSE-CAPILLARY: 124 mg/dL — AB (ref 70–99)
Glucose-Capillary: 77 mg/dL (ref 70–99)
Glucose-Capillary: 65 mg/dL — ABNORMAL LOW (ref 70–99)
Glucose-Capillary: 85 mg/dL (ref 70–99)

## 2014-06-17 LAB — CBC WITH DIFFERENTIAL/PLATELET
BASOS PCT: 0 % (ref 0–1)
Basophils Absolute: 0 10*3/uL (ref 0.0–0.1)
Eosinophils Absolute: 0.4 10*3/uL (ref 0.0–0.7)
Eosinophils Relative: 4 % (ref 0–5)
HCT: 27 % — ABNORMAL LOW (ref 36.0–46.0)
HEMOGLOBIN: 8.8 g/dL — AB (ref 12.0–15.0)
Lymphocytes Relative: 9 % — ABNORMAL LOW (ref 12–46)
Lymphs Abs: 0.9 10*3/uL (ref 0.7–4.0)
MCH: 29.4 pg (ref 26.0–34.0)
MCHC: 32.6 g/dL (ref 30.0–36.0)
MCV: 90.3 fL (ref 78.0–100.0)
MONOS PCT: 7 % (ref 3–12)
Monocytes Absolute: 0.6 10*3/uL (ref 0.1–1.0)
NEUTROS ABS: 7.2 10*3/uL (ref 1.7–7.7)
Neutrophils Relative %: 80 % — ABNORMAL HIGH (ref 43–77)
Platelets: 154 10*3/uL (ref 150–400)
RBC: 2.99 MIL/uL — AB (ref 3.87–5.11)
RDW: 14.8 % (ref 11.5–15.5)
WBC: 9 10*3/uL (ref 4.0–10.5)

## 2014-06-17 LAB — CARBOXYHEMOGLOBIN
CARBOXYHEMOGLOBIN: 1.5 % (ref 0.5–1.5)
Methemoglobin: 1.1 % (ref 0.0–1.5)
O2 Saturation: 70.1 %
Total hemoglobin: 12.1 g/dL (ref 12.0–16.0)

## 2014-06-17 MED ORDER — SODIUM CHLORIDE 0.9 % IJ SOLN
10.0000 mL | INTRAMUSCULAR | Status: DC | PRN
Start: 2014-06-17 — End: 2014-06-21

## 2014-06-17 MED ORDER — SODIUM CHLORIDE 0.9 % IJ SOLN
10.0000 mL | Freq: Two times a day (BID) | INTRAMUSCULAR | Status: DC
  Administered 2014-06-17 – 2014-06-29 (×15): 10 mL

## 2014-06-17 NOTE — Progress Notes (Signed)
Advanced Heart Failure Rounding Note   Subjective:   Admitted with shortness of breath and volume overload. Diuresed with IV lasix with worsening renal function. Transferred to stepdown with central line placed.   Yesterday she continued on lasix drip + milrinone 0.25 mcg. Increased urine output noted.  Last night she self d/c'd central line and it was replaced.  24 hour I/O -1.7 liters. Weight not accurate due to bed scale.   Mild dyspnea with exertion.  Renal Ultrasound - inconclusive ECHO 06/10/14 EF 55-60% Grade II DD MV moderate stenosis. Peak PA pressure 77  Creatinine 3.25 >pending TSH 18.36   Objective:   Weight Range:  Vital Signs:   Temp:  [97.5 F (36.4 C)-98.9 F (37.2 C)] 98.5 F (36.9 C) (10/07 0300) Pulse Rate:  [87-103] 93 (10/07 0600) Resp:  [12-27] 21 (10/07 0600) BP: (107-160)/(20-117) 116/35 mmHg (10/07 0600) SpO2:  [88 %-100 %] 97 % (10/07 0600) Weight:  [285 lb (129.275 kg)] 285 lb (129.275 kg) (10/07 0300) Last BM Date: 06/15/14  Weight change: Filed Weights   06/15/14 0638 06/16/14 0500 06/17/14 0300  Weight: 317 lb 14.5 oz (144.2 kg) 304 lb (137.893 kg) 285 lb (129.275 kg)    Intake/Output:   Intake/Output Summary (Last 24 hours) at 06/17/14 0653 Last data filed at 06/17/14 0600  Gross per 24 hour  Intake 626.85 ml  Output   2390 ml  Net -1763.15 ml     Physical Exam: CVP ~13-14 General: Lying in bed. No resp difficulty HEENT: normal Neck: supple. LIJ TLC. JVP to jaw . Carotids 2+ bilat; no bruits. No lymphadenopathy or thryomegaly appreciated. RIJ  Cor: PMI nondisplaced. Regular rate & rhythm. No rubs, gallops or murmurs. Lungs: clear Abdomen: obese, 4+ edema, nontender, +distended. No hepatosplenomegaly. No bruits or masses. Good bowel sounds. Rash under pannus  Extremities: no cyanosis, clubbing, rash, R and LLE 4+ edema extending to thighs.  Neuro: alert & orientedx3, cranial nerves grossly intact. moves all 4 extremities w/o  difficulty. Affect pleasant  Telemetry: SR 90s   Labs: Basic Metabolic Panel:  Recent Labs Lab 06/13/14 0404 06/14/14 0409 06/15/14 0315 06/15/14 2210 06/16/14 0450  NA 132* 136* 137 135* 137  K 4.5 4.0 4.1 4.1 3.9  CL 86* 90* 91* 90* 90*  CO2 32 35* 35* 34* 34*  GLUCOSE 524* 237* 162* 238* 207*  BUN 66* 76* 89* 98* 101*  CREATININE 1.95* 2.55* 3.08* 3.20* 3.25*  CALCIUM 9.4 9.4 8.9 8.4 8.3*    Liver Function Tests:  Recent Labs Lab 06/11/14 0502 06/12/14 0400 06/13/14 0404  AST 13 16 29   ALT 8 9 11   ALKPHOS 133* 152* 127*  BILITOT 1.0 1.2 0.8  PROT 7.1 8.1 8.1  ALBUMIN 2.7* 3.0* 3.0*   No results found for this basename: LIPASE, AMYLASE,  in the last 168 hours No results found for this basename: AMMONIA,  in the last 168 hours  CBC:  Recent Labs Lab 06/13/14 0404 06/14/14 0409 06/15/14 0315 06/16/14 0450 06/17/14 0504  WBC 14.6* 12.0* 9.8 9.8 9.0  NEUTROABS 13.8* 10.6* 7.8* 8.2* 7.2  HGB 11.0* 10.6* 10.6* 9.1* 8.8*  HCT 33.6* 33.3* 32.9* 28.1* 27.0*  MCV 92.1 91.7 91.9 90.6 90.3  PLT 225 198 186 172 154    Cardiac Enzymes:  Recent Labs Lab 06/10/14 1035  TROPONINI <0.30    BNP: BNP (last 3 results)  Recent Labs  09/10/13 1827 10/13/13 1916 06/09/14 1818  PROBNP 4050.0* 1519.0* 4358.0*     Other  results:    Imaging: Dg Chest Port 1 View  06/17/2014   CLINICAL DATA:  Initial encounter for left IJ line placement.  EXAM: PORTABLE CHEST - 1 VIEW  COMPARISON:  One-view chest 06/16/2014.  FINDINGS: A left IJ line is in place. The tip is in the distal innominate vein and confluence of the SVC. A right-sided neck catheter remains in place. Heart size scratch the cardiac enlargement and mild edema scratch the cardiac enlargement is stable. Mild diffuse edema has increased slightly. The patient is rotated to the right.  IMPRESSION: 1. Interval placement of left IJ line. The tip is at the confluence of the innominate vein and SVC. 2. No  significant pneumothorax. 3. Stable appearance of right neck catheter. 4. Increasing edema.   Electronically Signed   By: Gennette Pachris  Mattern M.D.   On: 06/17/2014 01:31   Dg Chest Port 1 View  06/16/2014   CLINICAL DATA:  Initial encounter for movement is of right IJ line. The line has been pulled back.  EXAM: PORTABLE CHEST - 1 VIEW  COMPARISON:  One-view chest 06/15/2014.  FINDINGS: The heart is enlarged. Mild edema has increased. A right IJ line has been pulled back and now terminates in the neck. It is impossible to know if the line remains intravascular or not. The visualized soft tissues and bony thorax are unremarkable.  IMPRESSION: 1. The right IJ line has been pulled back and now terminates within the neck. This could be extra vascular. 2. Cardiomegaly with increasing edema. These results were called by telephone at the time of interpretation on 06/16/2014 at 9:53 pm to the floor nurse, who verbally acknowledged these results.   Electronically Signed   By: Gennette Pachris  Mattern M.D.   On: 06/16/2014 21:53   Dg Chest Port 1 View  06/15/2014   CLINICAL DATA:  Readjustment of right central line.  EXAM: PORTABLE CHEST - 1 VIEW  COMPARISON:  06/15/2014  FINDINGS: The right IJ catheter tip is in the projection of the cavoatrial junction. Stable cardiac enlargement and pulmonary vascular congestion. No pleural effusion or edema identified.  IMPRESSION: 1. The tip of the IJ catheter is in the projection of the cavoatrial junction. No pneumothorax identified after central line placement.   Electronically Signed   By: Signa Kellaylor  Stroud M.D.   On: 06/15/2014 18:32   Dg Chest Port 1 View  06/15/2014   CLINICAL DATA:  Right central line placement.  EXAM: PORTABLE CHEST - 1 VIEW  COMPARISON:  Earlier today.  FINDINGS: The previously demonstrated right jugular catheter is now curved back upon itself in the distal aspect of the superior vena cava. No pneumothorax. The cardiac silhouette remains mildly enlarged. Diffuse  peribronchial thickening is unchanged. The lungs remain clear. Unremarkable bones.  IMPRESSION: 1. Right jugular catheter curved back upon itself in the distal superior vena cava. 2. Stable mild cardiomegaly and mild chronic bronchitic changes.   Electronically Signed   By: Gordan PaymentSteve  Reid M.D.   On: 06/15/2014 18:28     Medications:     Scheduled Medications: . antiseptic oral rinse  7 mL Mouth Rinse BID  . Brinzolamide-Brimonidine  1 drop Both Eyes BID  . Chlorhexidine Gluconate Cloth  6 each Topical Q0600  . heparin  5,000 Units Subcutaneous 3 times per day  . insulin aspart  0-15 Units Subcutaneous TID WC  . insulin aspart  0-5 Units Subcutaneous QHS  . insulin aspart  4 Units Subcutaneous TID WC  . insulin glargine  70 Units  Subcutaneous BID  . levothyroxine  25 mcg Oral QAC breakfast  . metoprolol tartrate  25 mg Oral BID  . mupirocin ointment  1 application Nasal BID  . nystatin   Topical TID  . pregabalin  75 mg Oral QPM  . sodium chloride  10-40 mL Intracatheter Q12H  . tiotropium  18 mcg Inhalation Daily    Infusions: . DOPamine Stopped (06/16/14 0100)  . furosemide (LASIX) infusion 15 mg/hr (06/17/14 0203)  . milrinone 0.25 mcg/kg/min (06/17/14 0102)    PRN Medications: albuterol, methocarbamol, morphine injection, oxyCODONE, sodium chloride, sorbitol   Assessment:  1. A/C Diastolic HF ECHO EF 55-60%  2. Mitral Valve Stenosis 3. Obesity 4. DM --> Hgb A1C 7 5. Cardio Renal Syndrome 6. Hypothyroidism --> TSH 18.36  7. Immobility  8. Elevated PA pressure 77 mm hg 9. HTN    Plan/Discussion:    Improved urine output over night. Continue lasix drip at 15 mg + milrinone 0.25 mcg. Check BMET now. Had renal ultrasound was completed but inconclusive due to obesity. Plan to RHC after she is fully diuresed.   Continue oxygen 2 liter [place]. Will need outpatient sleep study. Likely has sleep apnea.   Newly diagnosed hypothyroidism. On synthroid.   PT to follow up today.  Likely need SNF.   Amy Filbert Schilder, NP-C Length of Stay: 8  Advanced Heart Failure Team Pager [phone] (M-F; 7a - 4p)  Please contact [facility] for night-coverage after hours (4p -7a ) and weekends on [email or web address]   Patient seen and examined with Tonye Becket, NP. We discussed all aspects of the encounter. I agree with the assessment and plan as stated above.   Volume status now improving. Urine output picking up briskly. Await BMET. Will continue current regimen. Hopefully we can avoid need for HD. Still with lots of fluid on board.   Daniel Bensimhon,MD 2:15 PM

## 2014-06-17 NOTE — Procedures (Signed)
Staff note  - personally staffed procedure. Real time 2D ultrasound used for vein site selection, patency assessment, and needle entry A record of image was made but could not be submitted for filing due to malfunction of printing device  Dr. Kalman ShanMurali Derwin Reddy, M.D., [facility].C.P Pulmonary and Critical Care Medicine Staff Physician [facility] [place] [facility] Pager: [phone], If no answer or between  15:00h - 7:00h: call [phone]  06/17/2014 1:50 AM

## 2014-06-17 NOTE — Progress Notes (Signed)
[facility] PROGRESS NOTE  Assessment/Plan: Acute on chronic diastolic CHF exacerbation  - I/O cont to be negative; and finally with good urine output - Unknown dry weight. Massive fluid overload on exam - follow rec's from HF team, continue on milrinone and lasix GGT. -might need RHC once diuresed   Candida intertrigo: -continue nystatin powder.  Acute on chronic kidney disease stage III: - Baseline Cr 1.6, increase in Cr most likely due to HF and poor perfusion. - Chronic component, Secondary to uncontrol diabetes and hypertension.  - Acute component due to vol overload. Renal U/S no good windows; but no frank obstruction appreciated -Cr 3.2; will follow closely -urine output improving  New onset hypothyroidism: - TSH on admission was 18.36. No prior history of hypothyroidism.  - continue synthroid. - Repeat TSH in 6 weeks.  Hyponatremia  - Likely secondary to a hypervolemic hyponatremia - Resolved with diuresis. -will monitor  Chronic respiratory failure/COPD  -breathing improving - will need Sleep study as an outpatient; high probability of OSA.  Diabetes mellitus  - Cont Lantus and moderate scale SSI.  Prophylaxis  Heparin for DVT prophylaxis.   Code Status: Full  Family Communication: Updated patient no family at bedside.  Disposition Plan: SNF when medically stable.    Consultants:  none  Procedures:  Renal U/s - 2-D echo has been obtained that shows grade 2 diastolic dysfunction with EF of 55-60% with no wall motion abnormalities. - Renal function trending down.   Antibiotics:  None  HPI/Subjective: Alert, awake, oriented x3,afebrile; denies CP. Patient with significant fluid overload  Objective: Filed Vitals:   06/17/14 1400 06/17/14 1600 06/17/14 1706 06/17/14 1900  BP: 140/35 169/39 158/47 152/107  Pulse: 94 93 96 98  Temp:   98.3 F (36.8 C) 98.2 F (36.8 C)  TempSrc:   Oral Oral  Resp: 25 22 25 20   Height:      Weight:       SpO2: 99% 100% 100% 100%    Intake/Output Summary (Last 24 hours) at 06/17/14 2131 Last data filed at 06/17/14 1830  Gross per 24 hour  Intake 393.79 ml  Output   3000 ml  Net -2606.21 ml   Filed Weights   06/15/14 0638 06/16/14 0500 06/17/14 0300  Weight: 144.2 kg (317 lb 14.5 oz) 137.893 kg (304 lb) 129.275 kg (285 lb)    Exam:  General: Alert, awake, oriented x3,afebrile; denies CP. Patient with significant fluid overload. HEENT: No bruits, no goiter. Positive JVD Heart: Regular rate, no rubs or gallops; 3++ edema bilaterally Lungs: decrease BS at bases, no wheezing Abdomen: Soft, nontender, positive BS  Data Reviewed: Basic Metabolic Panel:  Recent Labs Lab 06/14/14 0409 06/15/14 0315 06/15/14 2210 06/16/14 0450 06/17/14 1350  NA 136* 137 135* 137 137  K 4.0 4.1 4.1 3.9 3.7  CL 90* 91* 90* 90* 90*  CO2 35* 35* 34* 34* 37*  GLUCOSE 237* 162* 238* 207* 59*  BUN 76* 89* 98* 101* 105*  CREATININE 2.55* 3.08* 3.20* 3.25* 3.22*  CALCIUM 9.4 8.9 8.4 8.3* 8.9   Liver Function Tests:  Recent Labs Lab 06/11/14 0502 06/12/14 0400 06/13/14 0404  AST 13 16 29   ALT 8 9 11   ALKPHOS 133* 152* 127*  BILITOT 1.0 1.2 0.8  PROT 7.1 8.1 8.1  ALBUMIN 2.7* 3.0* 3.0*   CBC:  Recent Labs Lab 06/13/14 0404 06/14/14 0409 06/15/14 0315 06/16/14 0450 06/17/14 0504  WBC 14.6* 12.0* 9.8 9.8 9.0  NEUTROABS 13.8* 10.6* 7.8*  8.2* 7.2  HGB 11.0* 10.6* 10.6* 9.1* 8.8*  HCT 33.6* 33.3* 32.9* 28.1* 27.0*  MCV 92.1 91.7 91.9 90.6 90.3  PLT 225 198 186 172 154   BNP (last 3 results)  Recent Labs  09/10/13 1827 10/13/13 1916 06/09/14 1818  PROBNP 4050.0* 1519.0* 4358.0*   CBG:  Recent Labs Lab 06/17/14 0812 06/17/14 1157 06/17/14 1709 06/17/14 1846 06/17/14 2120  GLUCAP 77 70 65* 85 124*    Recent Results (from the past 240 hour(s))  MRSA PCR SCREENING     Status: Abnormal   Collection Time    06/15/14  2:30 PM      Result Value Ref Range Status   MRSA  by PCR POSITIVE (*) NEGATIVE Final   Comment:            The GeneXpert MRSA Assay (FDA     approved for NASAL specimens     only), is one component of a     comprehensive MRSA colonization     surveillance program. It is not     intended to diagnose MRSA     infection nor to guide or     monitor treatment for     MRSA infections.     RESULT CALLED TO, READ BACK BY AND VERIFIED WITH:     Harriett RushJ. COVINGTON RN 16:10 06/15/14 ([email or web address])     Studies: Dg Chest Port 1 View  06/17/2014   CLINICAL DATA:  Initial encounter for left IJ line placement.  EXAM: PORTABLE CHEST - 1 VIEW  COMPARISON:  One-view chest 06/16/2014.  FINDINGS: A left IJ line is in place. The tip is in the distal innominate vein and confluence of the SVC. A right-sided neck catheter remains in place. Heart size scratch the cardiac enlargement and mild edema scratch the cardiac enlargement is stable. Mild diffuse edema has increased slightly. The patient is rotated to the right.  IMPRESSION: 1. Interval placement of left IJ line. The tip is at the confluence of the innominate vein and SVC. 2. No significant pneumothorax. 3. Stable appearance of right neck catheter. 4. Increasing edema.   Electronically Signed   By: Gennette Pachris  Mattern M.D.   On: 06/17/2014 01:31   Dg Chest Port 1 View  06/16/2014   CLINICAL DATA:  Initial encounter for movement is of right IJ line. The line has been pulled back.  EXAM: PORTABLE CHEST - 1 VIEW  COMPARISON:  One-view chest 06/15/2014.  FINDINGS: The heart is enlarged. Mild edema has increased. A right IJ line has been pulled back and now terminates in the neck. It is impossible to know if the line remains intravascular or not. The visualized soft tissues and bony thorax are unremarkable.  IMPRESSION: 1. The right IJ line has been pulled back and now terminates within the neck. This could be extra vascular. 2. Cardiomegaly with increasing edema. These results were called by telephone at the time of interpretation on  06/16/2014 at 9:53 pm to the floor nurse, who verbally acknowledged these results.   Electronically Signed   By: Gennette Pachris  Mattern M.D.   On: 06/16/2014 21:53    Scheduled Meds: . antiseptic oral rinse  7 mL Mouth Rinse BID  . Brinzolamide-Brimonidine  1 drop Both Eyes BID  . Chlorhexidine Gluconate Cloth  6 each Topical Q0600  . heparin  5,000 Units Subcutaneous 3 times per day  . insulin aspart  0-15 Units Subcutaneous TID WC  . insulin aspart  0-5 Units Subcutaneous QHS  . insulin  aspart  4 Units Subcutaneous TID WC  . insulin glargine  70 Units Subcutaneous BID  . levothyroxine  25 mcg Oral QAC breakfast  . metoprolol tartrate  25 mg Oral BID  . mupirocin ointment  1 application Nasal BID  . nystatin   Topical TID  . pregabalin  75 mg Oral QPM  . sodium chloride  10-40 mL Intracatheter Q12H  . tiotropium  18 mcg Inhalation Daily   Continuous Infusions: . DOPamine Stopped (06/16/14 0100)  . furosemide (LASIX) infusion 15 mg/hr (06/17/14 0203)  . milrinone 0.25 mcg/kg/min (06/17/14 1605)   Time: < 30 minutes  Vassie Loll  [facility] Pager [phone]. If 8PM-8AM, please contact night-coverage at [email or web address], password [facility] 06/17/2014, 9:31 PM  LOS: 8 days

## 2014-06-17 NOTE — Progress Notes (Addendum)
Clinical Social Work Department CLINICAL SOCIAL WORK PLACEMENT NOTE 06/17/2014  Patient:  [name],Kimberly K  Account Number:  [id number] Admit date:  06/09/2014  Clinical Social Worker:  Harless NakayamaPOONUM AMBELAL, LCSWA  Date/time:  06/17/2014 11:45 AM  Clinical Social Work is seeking post-discharge placement for this patient at the following level of care:   SKILLED NURSING   (*CSW will update this form in Epic as items are completed)   06/17/2014  Patient/family provided with [facility] of Clinical Social Work's list of facilities offering this level of care within the geographic area requested by the patient (or if unable, by the patient's family).  06/17/2014  Patient/family informed of their freedom to choose among providers that offer the needed level of care, that participate in Medicare, Medicaid or managed care program needed by the patient, have an available bed and are willing to accept the patient.  06/17/2014  Patient/family informed of [facility]' ownership interest in [facility], as well as of the fact that they are under no obligation to receive care at this facility.  PASARR submitted to EDS on [id number] PASARR number received on [id number]  FL2 transmitted to all facilities in geographic area requested by pt/family on  06/17/2014 FL2 transmitted to all facilities within larger geographic area on   Patient informed that his/her managed care company has contracts with or will negotiate with  certain facilities, including the following:     Patient/family informed of bed offers received:  06/17/2014 [facility] [place] Physician recommends and patient chooses bed at    [facility]    Patient to be transferred to facility by  Patient and family notified of transfer on  Name of family member notified:    The following physician request were entered in Epic: Physician Request  Please  sign FL2.    Additional Comments: **Pre-existing PASARR 06/29/14: CSW and Sharen HeckAllyson Kennedy, [facility] with patient. The admission director introduced herself and informed patient that they had rec'd authorization from insurance company, but will have to resubmit to [employer] as authorization ends today (10/19). Patient talked freely with CSW and Ms. Kyung RuddKennedy about her current hospital stay and her experiences at the hospital in taking medication (large pills). Her plan remains to  [facility] once ready for discharge.   Genelle BalVanessa Nezzie Manera, [name] 07/01/14: [facility]  for insurance authorization once patient medically stable for discharge.  Genelle BalVanessa Yordin Rhoda, [name]   07/02/14: Call made [facility] requested that they initiate insurance authorization as patient may be ready for d/c Friday or over weekend.       Poonum Ambelal, LCSWA [phone]

## 2014-06-17 NOTE — Procedures (Signed)
Central Venous Catheter Insertion Procedure Note Kimberly ManilaSusan K [name] [id number] September 30, 1955  Procedure: Insertion of Central Venous Catheter Indications: Assessment of intravascular volume, Drug and/or fluid administration and Frequent blood sampling  Procedure Details Consent: Risks of procedure as well as the alternatives and risks of each were explained to the (patient/caregiver).  Consent for procedure obtained. Time Out: Verified patient identification, verified procedure, site/side was marked, verified correct patient position, special equipment/implants available, medications/allergies/relevent history reviewed, required imaging and test results available.  Performed  Maximum sterile technique was used including antiseptics, cap, gloves, gown, hand hygiene, mask and sheet. Skin prep: Chlorhexidine; local anesthetic administered A antimicrobial bonded/coated triple lumen catheter was placed in the left internal jugular vein using the Seldinger technique.  Evaluation Blood flow good Complications: No apparent complications Patient did tolerate procedure well. Chest X-ray ordered to verify placement.  CXR: pending.  Procedure performed under direct ultrasound guidance for real time vessel cannulation.      Rutherford Guysahul Desai, PA - [facility] [place] [facility] Pgr: [phone]  or [phone]

## 2014-06-17 NOTE — Progress Notes (Signed)
Clinical Social Work Department BRIEF PSYCHOSOCIAL ASSESSMENT 06/17/2014  Patient:  [name],Rain K     Account Number:  [id number]     Admit date:  06/09/2014  Clinical Social Worker:  Harless NakayamaAMBELAL,Vickii Volland, LCSWA  Date/Time:  06/17/2014 11:30 AM  Referred by:  Physician  Date Referred:  06/17/2014 Referred for  SNF Placement   Other Referral:   Interview type:  Patient Other interview type:    PSYCHOSOCIAL DATA Living Status:  HUSBAND Admitted from facility:   Level of care:   Primary support name:  Valetta FullerJoel Bowlby Primary support relationship to patient:  SPOUSE Degree of support available:   Pt has good support    CURRENT CONCERNS Current Concerns  Post-Acute Placement   Other Concerns:    SOCIAL WORK ASSESSMENT / PLAN CSW aware of PT recommendation for SNF. CSW visited pt room and spoke with pt about recommendation. Pt informed CSW she has been to SNF before and felt like the rehab was excellent for her. Pt expressed that she is having a difficult time helping her self even move out of the bed and believes rehab would be beneficial. Pt reported that she is determined to put in hard work with physical therapy. CSW explained SNF referral process and pt is agreeable to referral being sent to [facility] with the exception of the previous facility she went to. Pt reports that she had numerous issues with facility business office and does not want to return to that facility. Pt has expressed concerns about payment. CSW informed pt that once pt has chosen a facility that facility will be responsible for getting insurance authorization prior. After this, facility should be able to provide pt with more information on co pay amounts. Pt thankful for CSW assistance with this process.   Assessment/plan status:  Psychosocial Support/Ongoing Assessment of Needs Other assessment/ plan:   Information/referral to community resources:   SNF list to be provided with bed offers     PATIENT'S/FAMILY'S RESPONSE TO PLAN OF CARE: Pt is agreeable to SNF. Pt pleasant and coopeartive.       Cheryl Chay, LCSWA [phone]

## 2014-06-18 DIAGNOSIS — R5381 Other malaise: Secondary | ICD-10-CM

## 2014-06-18 DIAGNOSIS — Z720 Tobacco use: Secondary | ICD-10-CM

## 2014-06-18 LAB — BASIC METABOLIC PANEL
Anion gap: 10 (ref 5–15)
BUN: 105 mg/dL — AB (ref 6–23)
CO2: 39 mEq/L — ABNORMAL HIGH (ref 19–32)
Calcium: 9.1 mg/dL (ref 8.4–10.5)
Chloride: 90 mEq/L — ABNORMAL LOW (ref 96–112)
Creatinine, Ser: 3.09 mg/dL — ABNORMAL HIGH (ref 0.50–1.10)
GFR calc Af Amer: 18 mL/min — ABNORMAL LOW (ref >90)
GFR, EST NON AFRICAN AMERICAN: 15 mL/min — AB (ref >90)
GLUCOSE: 56 mg/dL — AB (ref 70–99)
Potassium: 3.3 mEq/L — ABNORMAL LOW (ref 3.7–5.3)
Sodium: 139 mEq/L (ref 137–147)

## 2014-06-18 LAB — CBC WITH DIFFERENTIAL/PLATELET
BASOS ABS: 0 10*3/uL (ref 0.0–0.1)
BASOS PCT: 0 % (ref 0–1)
EOS ABS: 0.4 10*3/uL (ref 0.0–0.7)
Eosinophils Relative: 4 % (ref 0–5)
HCT: 28.6 % — ABNORMAL LOW (ref 36.0–46.0)
Hemoglobin: 9 g/dL — ABNORMAL LOW (ref 12.0–15.0)
Lymphocytes Relative: 10 % — ABNORMAL LOW (ref 12–46)
Lymphs Abs: 0.9 10*3/uL (ref 0.7–4.0)
MCH: 29 pg (ref 26.0–34.0)
MCHC: 31.5 g/dL (ref 30.0–36.0)
MCV: 92.3 fL (ref 78.0–100.0)
Monocytes Absolute: 0.6 10*3/uL (ref 0.1–1.0)
Monocytes Relative: 6 % (ref 3–12)
NEUTROS PCT: 80 % — AB (ref 43–77)
Neutro Abs: 7.7 10*3/uL (ref 1.7–7.7)
PLATELETS: 168 10*3/uL (ref 150–400)
RBC: 3.1 MIL/uL — ABNORMAL LOW (ref 3.87–5.11)
RDW: 15 % (ref 11.5–15.5)
WBC: 9.6 10*3/uL (ref 4.0–10.5)

## 2014-06-18 LAB — CARBOXYHEMOGLOBIN
Carboxyhemoglobin: 1.5 % (ref 0.5–1.5)
Methemoglobin: 0.9 % (ref 0.0–1.5)
O2 SAT: 69.7 %
TOTAL HEMOGLOBIN: 9.4 g/dL — AB (ref 12.0–16.0)

## 2014-06-18 LAB — GLUCOSE, CAPILLARY
GLUCOSE-CAPILLARY: 83 mg/dL (ref 70–99)
GLUCOSE-CAPILLARY: 101 mg/dL — AB (ref 70–99)
GLUCOSE-CAPILLARY: 101 mg/dL — AB (ref 70–99)
Glucose-Capillary: 66 mg/dL — ABNORMAL LOW (ref 70–99)
Glucose-Capillary: 79 mg/dL (ref 70–99)

## 2014-06-18 MED ORDER — LEVOTHYROXINE SODIUM 50 MCG PO TABS
50.0000 ug | ORAL_TABLET | Freq: Every day | ORAL | Status: DC
  Administered 2014-06-19 – 2014-06-22 (×4): 50 ug via ORAL
  Filled 2014-06-18 (×6): qty 1

## 2014-06-18 MED ORDER — POTASSIUM CHLORIDE CRYS ER 20 MEQ PO TBCR
20.0000 meq | EXTENDED_RELEASE_TABLET | Freq: Once | ORAL | Status: AC
  Administered 2014-06-18: 20 meq via ORAL
  Filled 2014-06-18: qty 1

## 2014-06-18 MED ORDER — POTASSIUM CHLORIDE CRYS ER 20 MEQ PO TBCR
40.0000 meq | EXTENDED_RELEASE_TABLET | Freq: Once | ORAL | Status: AC
Start: 2014-06-18 — End: 2014-06-18
  Administered 2014-06-18: 40 meq via ORAL
  Filled 2014-06-18: qty 2

## 2014-06-18 MED ORDER — INSULIN GLARGINE 100 UNIT/ML ~~LOC~~ SOLN
60.0000 [IU] | Freq: Two times a day (BID) | SUBCUTANEOUS | Status: DC
  Administered 2014-06-18: 50 [IU] via SUBCUTANEOUS
  Administered 2014-06-18: 60 [IU] via SUBCUTANEOUS
  Filled 2014-06-18 (×4): qty 0.6

## 2014-06-18 NOTE — Progress Notes (Addendum)
[facility] PROGRESS NOTE  Assessment/Plan: Acute on chronic diastolic CHF exacerbation  - I/O cont to be negative; and finally with good urine output - Unknown dry weight. Massive fluid overload on exam - follow rec's from HF team, continue on milrinone and lasix GGT. -might need RHC once diuresed   Candida intertrigo: -continue nystatin powder.  Acute on chronic kidney disease stage III: - Baseline Cr 1.6, increase in Cr most likely due to HF and poor perfusion. - Chronic component, Secondary to uncontrol diabetes and hypertension.  - Acute component due to vol overload. Renal U/S no good windows; but no frank obstruction appreciated -Cr 3.09 (better today 10/8); will follow closely -urine output improving   New onset hypothyroidism: - TSH on admission was 18.36. No prior history of hypothyroidism.  - continue synthroid. - Repeat TSH in 6 weeks.  Hyponatremia  - Likely secondary to a hypervolemic hyponatremia and hyperglycemia - Resolved with diuresis. -will monitor  Chronic respiratory failure/COPD  -breathing improving -will need Sleep study as an outpatient; high probability of OSA.  Diabetes mellitus with mild hypoglycemia -Cont Lantus (will adjust to just 60 units BID) and will continue moderate scale SSI.  Physical deconditioning -might need SNF at discharge -continue PT  Prophylaxis  -continue Heparin for DVT prophylaxis.   Code Status: Full  Family Communication: Updated patient no family at bedside.  Disposition Plan: SNF when medically stable.   Consultants:  none  Procedures:  Renal U/s - 2-D echo has been obtained that shows grade 2 diastolic dysfunction with EF of 55-60% with no wall motion abnormalities. - Renal function trending down.   Antibiotics:  None  HPI/Subjective: Alert, awake, oriented x3, afebrile; denies CP and reports breathing is better. Patient still with significant fluid overload signs on exam. Brisk diuresis  continue; Cr stable to improved.  Objective: Filed Vitals:   06/18/14 0500 06/18/14 0700 06/18/14 0800 06/18/14 0813  BP: 144/56 117/33 104/30 117/61  Pulse: 91 89 90 90  Temp:    97.6 F (36.4 C)  TempSrc:    Oral  Resp: 36 15 18 12   Height:      Weight: 128 kg (282 lb 3 oz)     SpO2: 97% 92% 98% 97%    Intake/Output Summary (Last 24 hours) at 06/18/14 0846 Last data filed at 06/18/14 0813  Gross per 24 hour  Intake  593.4 ml  Output   4275 ml  Net -3681.6 ml   Filed Weights   06/16/14 0500 06/17/14 0300 06/18/14 0500  Weight: 137.893 kg (304 lb) 129.275 kg (285 lb) 128 kg (282 lb 3 oz)    Exam:  General: Alert, awake, oriented x3, afebrile; denies CP and reports breathing is better. Patient still with significant fluid overload signs on exam. HEENT: No bruits, no goiter. Positive JVD Heart: Regular rate, no rubs or gallops; 3++ edema bilaterally Lungs: decrease BS at bases, no wheezing Abdomen: Soft, nontender, positive BS; significant edema around abdomen and sacrum  Data Reviewed: Basic Metabolic Panel:  Recent Labs Lab 06/15/14 0315 06/15/14 2210 06/16/14 0450 06/17/14 1350 06/18/14 0440  NA 137 135* 137 137 139  K 4.1 4.1 3.9 3.7 3.3*  CL 91* 90* 90* 90* 90*  CO2 35* 34* 34* 37* 39*  GLUCOSE 162* 238* 207* 59* 56*  BUN 89* 98* 101* 105* 105*  CREATININE 3.08* 3.20* 3.25* 3.22* 3.09*  CALCIUM 8.9 8.4 8.3* 8.9 9.1   Liver Function Tests:  Recent Labs Lab 06/12/14 0400 06/13/14 0404  AST 16 29  ALT 9 11  ALKPHOS 152* 127*  BILITOT 1.2 0.8  PROT 8.1 8.1  ALBUMIN 3.0* 3.0*   CBC:  Recent Labs Lab 06/14/14 0409 06/15/14 0315 06/16/14 0450 06/17/14 0504 06/18/14 0440  WBC 12.0* 9.8 9.8 9.0 9.6  NEUTROABS 10.6* 7.8* 8.2* 7.2 7.7  HGB 10.6* 10.6* 9.1* 8.8* 9.0*  HCT 33.3* 32.9* 28.1* 27.0* 28.6*  MCV 91.7 91.9 90.6 90.3 92.3  PLT 198 186 172 154 168   BNP (last 3 results)  Recent Labs  09/10/13 1827 10/13/13 1916 06/09/14 1818    PROBNP 4050.0* 1519.0* 4358.0*   CBG:  Recent Labs Lab 06/17/14 0812 06/17/14 1157 06/17/14 1709 06/17/14 1846 06/17/14 2120  GLUCAP 77 70 65* 85 124*    Recent Results (from the past 240 hour(s))  MRSA PCR SCREENING     Status: Abnormal   Collection Time    06/15/14  2:30 PM      Result Value Ref Range Status   MRSA by PCR POSITIVE (*) NEGATIVE Final   Comment:            The GeneXpert MRSA Assay (FDA     approved for NASAL specimens     only), is one component of a     comprehensive MRSA colonization     surveillance program. It is not     intended to diagnose MRSA     infection nor to guide or     monitor treatment for     MRSA infections.     RESULT CALLED TO, READ BACK BY AND VERIFIED WITH:     Harriett Rush RN 16:10 06/15/14 ([email or web address])     Studies: Dg Chest Port 1 View  06/17/2014   CLINICAL DATA:  Initial encounter for left IJ line placement.  EXAM: PORTABLE CHEST - 1 VIEW  COMPARISON:  One-view chest 06/16/2014.  FINDINGS: A left IJ line is in place. The tip is in the distal innominate vein and confluence of the SVC. A right-sided neck catheter remains in place. Heart size scratch the cardiac enlargement and mild edema scratch the cardiac enlargement is stable. Mild diffuse edema has increased slightly. The patient is rotated to the right.  IMPRESSION: 1. Interval placement of left IJ line. The tip is at the confluence of the innominate vein and SVC. 2. No significant pneumothorax. 3. Stable appearance of right neck catheter. 4. Increasing edema.   Electronically Signed   By: Gennette Pac M.D.   On: 06/17/2014 01:31   Dg Chest Port 1 View  06/16/2014   CLINICAL DATA:  Initial encounter for movement is of right IJ line. The line has been pulled back.  EXAM: PORTABLE CHEST - 1 VIEW  COMPARISON:  One-view chest 06/15/2014.  FINDINGS: The heart is enlarged. Mild edema has increased. A right IJ line has been pulled back and now terminates in the neck. It is impossible to  know if the line remains intravascular or not. The visualized soft tissues and bony thorax are unremarkable.  IMPRESSION: 1. The right IJ line has been pulled back and now terminates within the neck. This could be extra vascular. 2. Cardiomegaly with increasing edema. These results were called by telephone at the time of interpretation on 06/16/2014 at 9:53 pm to the floor nurse, who verbally acknowledged these results.   Electronically Signed   By: Gennette Pac M.D.   On: 06/16/2014 21:53    Scheduled Meds: . antiseptic oral rinse  7 mL Mouth Rinse  BID  . Brinzolamide-Brimonidine  1 drop Both Eyes BID  . Chlorhexidine Gluconate Cloth  6 each Topical Q0600  . heparin  5,000 Units Subcutaneous 3 times per day  . insulin aspart  0-15 Units Subcutaneous TID WC  . insulin aspart  0-5 Units Subcutaneous QHS  . insulin aspart  4 Units Subcutaneous TID WC  . insulin glargine  60 Units Subcutaneous BID  . [START ON 06/19/2014] levothyroxine  50 mcg Oral QAC breakfast  . metoprolol tartrate  25 mg Oral BID  . mupirocin ointment  1 application Nasal BID  . nystatin   Topical TID  . potassium chloride  20 mEq Oral Once  . pregabalin  75 mg Oral QPM  . sodium chloride  10-40 mL Intracatheter Q12H  . tiotropium  18 mcg Inhalation Daily   Continuous Infusions: . DOPamine Stopped (06/16/14 0100)  . furosemide (LASIX) infusion 15 mg/hr (06/17/14 2232)  . milrinone 0.25 mcg/kg/min (06/18/14 0158)   Time: < 30 minutes  Vassie Loll  [facility] Pager [phone]. If 8PM-8AM, please contact night-coverage at [email or web address], password [facility] 06/18/2014, 8:46 AM  LOS: 9 days

## 2014-06-18 NOTE — Progress Notes (Signed)
Physical Therapy Treatment Patient Details Name: Kimberly [name] MRN: [id number] DOB: 1955/09/18 Today's Date: 06/18/2014    History of Present Illness pt presents with Anasarca and SOB.      PT Comments    Pt progressing towards physical therapy goals. Was able to safely transfer bed to chair with +2 assist. Discussed with nursing use of appropriate lift for back to bed, and lift pad left in room. Encouraged pt to continue with HEP. Feel pt is able to begin gait training next session.   Follow Up Recommendations  SNF;Supervision for mobility/OOB     Equipment Recommendations  None recommended by PT    Recommendations for Other Services       Precautions / Restrictions Precautions Precautions: Fall Precaution Comments: O2 dependent at home.   Restrictions Weight Bearing Restrictions: No    Mobility  Bed Mobility Overal bed mobility: Needs Assistance;+2 for physical assistance Bed Mobility: Supine to Sit     Supine to sit: Mod assist;HOB elevated;+2 for safety/equipment     General bed mobility comments: Pt able to transition to EOB with assist for trunk elevation to full sitting position, as well as for initial steadying and balance while sitting EOB.   Transfers Overall transfer level: Needs assistance Equipment used: Rolling walker (2 wheeled) Transfers: Sit to/from [employer] Sit to Stand: Max assist;+2 physical assistance;+2 safety/equipment Stand pivot transfers: Mod assist;+2 safety/equipment       General transfer comment: Pt able to maintain static standing at edge of chair before taking pivotal steps around to the recliner. +2 for safety and pt was able to negotiate the walker fairly well.   Ambulation/Gait             General Gait Details: Deferred at this time due to safety.    Stairs            Wheelchair Mobility    Modified Rankin (Stroke Patients Only)       Balance Overall balance assessment: Needs  assistance Sitting-balance support: Feet supported;No upper extremity supported Sitting balance-Leahy Scale: Fair Sitting balance - Comments: After initial transfer to sitting pt was able to maintain static sitting EOB without assistance or UE support.    Standing balance support: Bilateral upper extremity supported;During functional activity Standing balance-Leahy Scale: Poor Standing balance comment: Pt requires UE support to maintain standing balance at this time.                     Cognition Arousal/Alertness: Awake/alert Behavior During Therapy: WFL for tasks assessed/performed Overall Cognitive Status: Within Functional Limits for tasks assessed                      Exercises      General Comments General comments (skin integrity, edema, etc.): Pt states she has been performing HEP on her own between therapy sessions.       Pertinent Vitals/Pain Pain Assessment: No/denies pain    Home Living                      Prior Function            PT Goals (current goals can now be found in the care plan section) Acute Rehab PT Goals Patient Stated Goal: To get stronger - states she has been doing her HEP PT Goal Formulation: With patient Time For Goal Achievement: 06/26/14 Potential to Achieve Goals: Good Progress towards PT goals: Progressing toward goals  Frequency  Min 2X/week    PT Plan Current plan remains appropriate    Co-evaluation             End of Session Equipment Utilized During Treatment: Gait belt;Oxygen Activity Tolerance: Patient limited by fatigue Patient left: in chair;with call bell/phone within reach     Time: 0933-1010 PT Time Calculation (min): 37 min  Charges:  $Gait Training: 8-22 mins $Therapeutic Activity: 8-22 mins                    G Codes:      Conni SlipperKirkman, Berry Gallacher 06/18/2014, 11:08 AM  Conni SlipperLaura Tyr Franca, PT, DPT Acute Rehabilitation Services Pager: [phone]

## 2014-06-18 NOTE — Progress Notes (Signed)
Advanced Heart Failure Rounding Note   Subjective:   Admitted with shortness of breath and volume overload. Diuresed with IV lasix with worsening renal function. Transferred to stepdown with central line placed.   Yesterday she continued on lasix drip + milrinone 0.25 mcg. Increased urine output noted.   24 hour I/O -2.9  liters. Weight not accurate due to bed scale.   Mild dyspnea with exertion. Feeling much better.   Renal Ultrasound - inconclusive ECHO 06/10/14 EF 55-60% Grade II DD MV moderate stenosis. Peak PA pressure 77  Creatinine 3.25 >3.09 TSH 18.36   Objective:   Weight Range:  Vital Signs:   Temp:  [97.2 F (36.2 C)-98.9 F (37.2 C)] 97.2 F (36.2 C) (10/08 0300) Pulse Rate:  [89-100] 89 (10/08 0700) Resp:  [15-36] 15 (10/08 0700) BP: (105-169)/(29-107) 117/33 mmHg (10/08 0700) SpO2:  [92 %-100 %] 92 % (10/08 0700) Weight:  [282 lb 3 oz (128 kg)] 282 lb 3 oz (128 kg) (10/08 0500) Last BM Date: 06/15/14  Weight change: Filed Weights   06/16/14 0500 06/17/14 0300 06/18/14 0500  Weight: 304 lb (137.893 kg) 285 lb (129.275 kg) 282 lb 3 oz (128 kg)    Intake/Output:   Intake/Output Summary (Last 24 hours) at 06/18/14 0743 Last data filed at 06/18/14 0700  Gross per 24 hour  Intake    645 ml  Output   3575 ml  Net  -2930 ml     Physical Exam: CVP ~16 General: Lying in bed. No resp difficulty HEENT: normal Neck: supple. LIJ TLC. JVP  jaw . Carotids 2+ bilat; no bruits. No lymphadenopathy or thryomegaly appreciated. RIJ  Cor: PMI nondisplaced. Regular rate & rhythm. No rubs, gallops or murmurs. Lungs: clear Abdomen: obese, nontender, +distended. No hepatosplenomegaly. No bruits or masses. Good bowel sounds. Rash under pannus  Extremities: no cyanosis, clubbing, rash, R and LLE 1-2 edema Neuro: alert & orientedx3, cranial nerves grossly intact. moves all 4 extremities w/o difficulty. Affect pleasant  Telemetry: SR 90s   Labs: Basic Metabolic  Panel:  Recent Labs Lab 06/15/14 0315 06/15/14 2210 06/16/14 0450 06/17/14 1350 06/18/14 0440  NA 137 135* 137 137 139  K 4.1 4.1 3.9 3.7 3.3*  CL 91* 90* 90* 90* 90*  CO2 35* 34* 34* 37* 39*  GLUCOSE 162* 238* 207* 59* 56*  BUN 89* 98* 101* 105* 105*  CREATININE 3.08* 3.20* 3.25* 3.22* 3.09*  CALCIUM 8.9 8.4 8.3* 8.9 9.1    Liver Function Tests:  Recent Labs Lab 06/12/14 0400 06/13/14 0404  AST 16 29  ALT 9 11  ALKPHOS 152* 127*  BILITOT 1.2 0.8  PROT 8.1 8.1  ALBUMIN 3.0* 3.0*   No results found for this basename: LIPASE, AMYLASE,  in the last 168 hours No results found for this basename: AMMONIA,  in the last 168 hours  CBC:  Recent Labs Lab 06/14/14 0409 06/15/14 0315 06/16/14 0450 06/17/14 0504 06/18/14 0440  WBC 12.0* 9.8 9.8 9.0 9.6  NEUTROABS 10.6* 7.8* 8.2* 7.2 7.7  HGB 10.6* 10.6* 9.1* 8.8* 9.0*  HCT 33.3* 32.9* 28.1* 27.0* 28.6*  MCV 91.7 91.9 90.6 90.3 92.3  PLT 198 186 172 154 168    Cardiac Enzymes: No results found for this basename: CKTOTAL, CKMB, CKMBINDEX, TROPONINI,  in the last 168 hours  BNP: BNP (last 3 results)  Recent Labs  09/10/13 1827 10/13/13 1916 06/09/14 1818  PROBNP 4050.0* 1519.0* 4358.0*     Other results:    Imaging: Dg Chest  Port 1 View  06/17/2014   CLINICAL DATA:  Initial encounter for left IJ line placement.  EXAM: PORTABLE CHEST - 1 VIEW  COMPARISON:  One-view chest 06/16/2014.  FINDINGS: A left IJ line is in place. The tip is in the distal innominate vein and confluence of the SVC. A right-sided neck catheter remains in place. Heart size scratch the cardiac enlargement and mild edema scratch the cardiac enlargement is stable. Mild diffuse edema has increased slightly. The patient is rotated to the right.  IMPRESSION: 1. Interval placement of left IJ line. The tip is at the confluence of the innominate vein and SVC. 2. No significant pneumothorax. 3. Stable appearance of right neck catheter. 4. Increasing  edema.   Electronically Signed   By: Gennette Pac M.D.   On: 06/17/2014 01:31   Dg Chest Port 1 View  06/16/2014   CLINICAL DATA:  Initial encounter for movement is of right IJ line. The line has been pulled back.  EXAM: PORTABLE CHEST - 1 VIEW  COMPARISON:  One-view chest 06/15/2014.  FINDINGS: The heart is enlarged. Mild edema has increased. A right IJ line has been pulled back and now terminates in the neck. It is impossible to know if the line remains intravascular or not. The visualized soft tissues and bony thorax are unremarkable.  IMPRESSION: 1. The right IJ line has been pulled back and now terminates within the neck. This could be extra vascular. 2. Cardiomegaly with increasing edema. These results were called by telephone at the time of interpretation on 06/16/2014 at 9:53 pm to the floor nurse, who verbally acknowledged these results.   Electronically Signed   By: Gennette Pac M.D.   On: 06/16/2014 21:53     Medications:     Scheduled Medications: . antiseptic oral rinse  7 mL Mouth Rinse BID  . Brinzolamide-Brimonidine  1 drop Both Eyes BID  . Chlorhexidine Gluconate Cloth  6 each Topical Q0600  . heparin  5,000 Units Subcutaneous 3 times per day  . insulin aspart  0-15 Units Subcutaneous TID WC  . insulin aspart  0-5 Units Subcutaneous QHS  . insulin aspart  4 Units Subcutaneous TID WC  . insulin glargine  70 Units Subcutaneous BID  . levothyroxine  25 mcg Oral QAC breakfast  . metoprolol tartrate  25 mg Oral BID  . mupirocin ointment  1 application Nasal BID  . nystatin   Topical TID  . pregabalin  75 mg Oral QPM  . sodium chloride  10-40 mL Intracatheter Q12H  . tiotropium  18 mcg Inhalation Daily    Infusions: . DOPamine Stopped (06/16/14 0100)  . furosemide (LASIX) infusion 15 mg/hr (06/17/14 2232)  . milrinone 0.25 mcg/kg/min (06/18/14 0158)    PRN Medications: albuterol, methocarbamol, morphine injection, oxyCODONE, sodium chloride, sorbitol   Assessment:   1. A/C Diastolic HF ECHO EF 55-60%  2. Mitral Valve Stenosis 3. Obesity 4. DM --> Hgb A1C 7 5. Cardio Renal Syndrome 6. Hypothyroidism --> TSH 18.36  7. Immobility  8. Elevated PA pressure 77 mm hg 9. HTN    Plan/Discussion:    Brisk diuresis overnight. Weight down 61 pounds. Volume status improving. CVP down 1o 16. Continue lasix drip at 15 mg + milrinone 0.25 mcg. Renal function ok. Supplement potassium.   Renal ultrasound was completed but inconclusive due to obesity. Plan to RHC after she is fully diuresed.   Continue oxygen 2 liter [place]. Will need outpatient sleep study. Likely has sleep apnea.  Newly diagnosed hypothyroidism. On synthroid.   Primary team to address hypoglycemia.   PT following. Likely need SNF.   Amy Clegg, NP-C Length of Stay: 9  Advanced Heart Failure Team Pager [phone] (M-F; 7a - 4p)  Please contact  Cardiology for night-coverage after hours (4p -7a ) and weekends on [email or web address]  Patient seen with NP, agree with the above note.  She is diuresing well at this point.  CVP 16, will continue current regimen today.  Creatinine stable but I am concerned with BUN 105.  This does appear stable.  Will need to watch renal fxn closely, avoid over-shooting diuresis.    Noted moderate mitral stenosis on echo.  Not good operative candidate.   Marca Ancona 06/18/2014 9:25 AM

## 2014-06-19 LAB — CBC WITH DIFFERENTIAL/PLATELET
BASOS PCT: 0 % (ref 0–1)
Basophils Absolute: 0 10*3/uL (ref 0.0–0.1)
EOS ABS: 0.4 10*3/uL (ref 0.0–0.7)
Eosinophils Relative: 3 % (ref 0–5)
HCT: 27.9 % — ABNORMAL LOW (ref 36.0–46.0)
Hemoglobin: 9 g/dL — ABNORMAL LOW (ref 12.0–15.0)
Lymphocytes Relative: 7 % — ABNORMAL LOW (ref 12–46)
Lymphs Abs: 0.9 10*3/uL (ref 0.7–4.0)
MCH: 29.5 pg (ref 26.0–34.0)
MCHC: 32.3 g/dL (ref 30.0–36.0)
MCV: 91.5 fL (ref 78.0–100.0)
MONOS PCT: 9 % (ref 3–12)
Monocytes Absolute: 1.1 10*3/uL — ABNORMAL HIGH (ref 0.1–1.0)
NEUTROS PCT: 81 % — AB (ref 43–77)
Neutro Abs: 9.8 10*3/uL — ABNORMAL HIGH (ref 1.7–7.7)
PLATELETS: 196 10*3/uL (ref 150–400)
RBC: 3.05 MIL/uL — ABNORMAL LOW (ref 3.87–5.11)
RDW: 14.9 % (ref 11.5–15.5)
WBC: 12.2 10*3/uL — ABNORMAL HIGH (ref 4.0–10.5)

## 2014-06-19 LAB — GLUCOSE, CAPILLARY
GLUCOSE-CAPILLARY: 83 mg/dL (ref 70–99)
Glucose-Capillary: 41 mg/dL — CL (ref 70–99)
Glucose-Capillary: 95 mg/dL (ref 70–99)
Glucose-Capillary: 58 mg/dL — ABNORMAL LOW (ref 70–99)
Glucose-Capillary: 98 mg/dL (ref 70–99)
Glucose-Capillary: 58 mg/dL — ABNORMAL LOW (ref 70–99)
Glucose-Capillary: 100 mg/dL — ABNORMAL HIGH (ref 70–99)

## 2014-06-19 LAB — CARBOXYHEMOGLOBIN
Carboxyhemoglobin: 2.1 % — ABNORMAL HIGH (ref 0.5–1.5)
Methemoglobin: 0.3 % (ref 0.0–1.5)
O2 Saturation: 72.4 %
TOTAL HEMOGLOBIN: 10.2 g/dL — AB (ref 12.0–16.0)

## 2014-06-19 LAB — BASIC METABOLIC PANEL
Anion gap: 14 (ref 5–15)
BUN: 103 mg/dL — ABNORMAL HIGH (ref 6–23)
CO2: 39 mEq/L — ABNORMAL HIGH (ref 19–32)
Calcium: 9.2 mg/dL (ref 8.4–10.5)
Chloride: 89 mEq/L — ABNORMAL LOW (ref 96–112)
Creatinine, Ser: 2.97 mg/dL — ABNORMAL HIGH (ref 0.50–1.10)
GFR calc Af Amer: 19 mL/min — ABNORMAL LOW (ref >90)
GFR calc non Af Amer: 16 mL/min — ABNORMAL LOW (ref >90)
GLUCOSE: 39 mg/dL — AB (ref 70–99)
POTASSIUM: 3.5 meq/L — AB (ref 3.7–5.3)
SODIUM: 142 meq/L (ref 137–147)

## 2014-06-19 MED ORDER — DEXTROSE 50 % IV SOLN
25.0000 mL | Freq: Once | INTRAVENOUS | Status: AC | PRN
  Administered 2014-06-19: 25 mL via INTRAVENOUS

## 2014-06-19 MED ORDER — METOLAZONE 2.5 MG PO TABS
2.5000 mg | ORAL_TABLET | Freq: Every day | ORAL | Status: DC
  Administered 2014-06-19: 2.5 mg via ORAL
  Filled 2014-06-19 (×2): qty 1

## 2014-06-19 MED ORDER — CHLORHEXIDINE GLUCONATE CLOTH 2 % EX PADS
6.0000 | MEDICATED_PAD | Freq: Every day | CUTANEOUS | Status: AC
  Administered 2014-06-19 – 2014-06-20 (×2): 6 via TOPICAL

## 2014-06-19 MED ORDER — SODIUM CHLORIDE 0.9 % IJ SOLN
10.0000 mL | INTRAMUSCULAR | Status: DC | PRN
  Administered 2014-06-19: 10 mL

## 2014-06-19 MED ORDER — INSULIN ASPART 100 UNIT/ML ~~LOC~~ SOLN
0.0000 [IU] | Freq: Three times a day (TID) | SUBCUTANEOUS | Status: DC
  Administered 2014-06-20: 2 [IU] via SUBCUTANEOUS
  Administered 2014-06-20: 3 [IU] via SUBCUTANEOUS
  Administered 2014-06-21 (×2): 2 [IU] via SUBCUTANEOUS
  Administered 2014-06-21: 3 [IU] via SUBCUTANEOUS
  Administered 2014-06-22: 2 [IU] via SUBCUTANEOUS
  Administered 2014-06-22: 3 [IU] via SUBCUTANEOUS
  Administered 2014-06-22: 2 [IU] via SUBCUTANEOUS

## 2014-06-19 MED ORDER — SODIUM CHLORIDE 0.9 % IJ SOLN
10.0000 mL | Freq: Two times a day (BID) | INTRAMUSCULAR | Status: DC
  Administered 2014-06-19 – 2014-07-05 (×11): 10 mL

## 2014-06-19 MED ORDER — INSULIN GLARGINE 100 UNIT/ML ~~LOC~~ SOLN
30.0000 [IU] | Freq: Two times a day (BID) | SUBCUTANEOUS | Status: DC
  Administered 2014-06-20 – 2014-06-22 (×5): 30 [IU] via SUBCUTANEOUS
  Filled 2014-06-19 (×7): qty 0.3

## 2014-06-19 MED ORDER — DEXTROSE 50 % IV SOLN
25.0000 mL | Freq: Once | INTRAVENOUS | Status: AC
  Administered 2014-06-19: 25 mL via INTRAVENOUS

## 2014-06-19 MED ORDER — INSULIN GLARGINE 100 UNIT/ML ~~LOC~~ SOLN
55.0000 [IU] | Freq: Two times a day (BID) | SUBCUTANEOUS | Status: DC
  Filled 2014-06-19: qty 0.55

## 2014-06-19 MED ORDER — DEXTROSE 50 % IV SOLN
INTRAVENOUS | Status: AC
  Administered 2014-06-19: 25 mL via INTRAVENOUS
  Filled 2014-06-19: qty 50

## 2014-06-19 MED ORDER — POTASSIUM CHLORIDE ER 10 MEQ PO TBCR
20.0000 meq | EXTENDED_RELEASE_TABLET | Freq: Every day | ORAL | Status: DC
  Administered 2014-06-19 – 2014-06-22 (×4): 20 meq via ORAL
  Filled 2014-06-19 (×4): qty 2

## 2014-06-19 NOTE — Progress Notes (Signed)
Advanced Heart Failure Rounding Note   Subjective:    58 y/o admitted with diastolic HD, anasarca and a/c renal failure.   Continues on lasix drip + milrinone 0.25 mcg. Diuresing well. Weight seems inaccurate (up 9 pounds)   Remains essentially bedbound.  No orthopnea/OND.   Renal Ultrasound - inconclusive ECHO 06/10/14 EF 55-60% Grade II DD MV moderate stenosis. Peak PA pressure 77  Creatinine 3.25 >3.09> 2.97 Co-ox 72% BUN 105->103 Admit BUN/CR 56/2.06  Objective:   Weight Range:  Vital Signs:   Temp:  [97.9 F (36.6 C)-98.2 F (36.8 C)] 98.1 F (36.7 C) (10/09 0840) Pulse Rate:  [92-106] 98 (10/09 0900) Resp:  [13-31] 31 (10/09 0900) BP: (107-132)/(35-107) 107/49 mmHg (10/09 0840) SpO2:  [93 %-100 %] 100 % (10/09 0900) FiO2 (%):  [3 %] 3 % (10/08 1600) Weight:  [132 kg (291 lb 0.1 oz)] 132 kg (291 lb 0.1 oz) (10/09 0352) Last BM Date: 06/15/14  Weight change: Filed Weights   06/17/14 0300 06/18/14 0500 06/19/14 0352  Weight: 129.275 kg (285 lb) 128 kg (282 lb 3 oz) 132 kg (291 lb 0.1 oz)    Intake/Output:   Intake/Output Summary (Last 24 hours) at 06/19/14 1207 Last data filed at 06/19/14 0900  Gross per 24 hour  Intake  901.8 ml  Output   3525 ml  Net -2623.2 ml     Physical Exam: CVP ~21 General: Lying in bed. No resp difficulty HEENT: normal Neck: supple. LIJ TLC. JVP  jaw . Carotids 2+ bilat; no bruits. No lymphadenopathy or thryomegaly appreciated. RIJ  Cor: PMI nondisplaced. Regular rate & rhythm. No rubs, gallops or murmurs. Lungs: clear Abdomen: obese, nontender, +distended. No hepatosplenomegaly. No bruits or masses. Good bowel sounds. Rash under pannus  Extremities: no cyanosis, clubbing, rash, R and LLE 1-2 edema Neuro: alert & orientedx3, cranial nerves grossly intact. moves all 4 extremities w/o difficulty. Affect pleasant  Telemetry: SR 90s   Labs: Basic Metabolic Panel:  Recent Labs Lab 06/15/14 2210 06/16/14 0450 06/17/14 1350  06/18/14 0440 06/19/14 0235  NA 135* 137 137 139 142  K 4.1 3.9 3.7 3.3* 3.5*  CL 90* 90* 90* 90* 89*  CO2 34* 34* 37* 39* 39*  GLUCOSE 238* 207* 59* 56* 39*  BUN 98* 101* 105* 105* 103*  CREATININE 3.20* 3.25* 3.22* 3.09* 2.97*  CALCIUM 8.4 8.3* 8.9 9.1 9.2    Liver Function Tests:  Recent Labs Lab 06/13/14 0404  AST 29  ALT 11  ALKPHOS 127*  BILITOT 0.8  PROT 8.1  ALBUMIN 3.0*   No results found for this basename: LIPASE, AMYLASE,  in the last 168 hours No results found for this basename: AMMONIA,  in the last 168 hours  CBC:  Recent Labs Lab 06/15/14 0315 06/16/14 0450 06/17/14 0504 06/18/14 0440 06/19/14 0235  WBC 9.8 9.8 9.0 9.6 12.2*  NEUTROABS 7.8* 8.2* 7.2 7.7 9.8*  HGB 10.6* 9.1* 8.8* 9.0* 9.0*  HCT 32.9* 28.1* 27.0* 28.6* 27.9*  MCV 91.9 90.6 90.3 92.3 91.5  PLT 186 172 154 168 196    Cardiac Enzymes: No results found for this basename: CKTOTAL, CKMB, CKMBINDEX, TROPONINI,  in the last 168 hours  BNP: BNP (last 3 results)  Recent Labs  09/10/13 1827 10/13/13 1916 06/09/14 1818  PROBNP 4050.0* 1519.0* 4358.0*     Other results:    Imaging: No results found.   Medications:     Scheduled Medications: . antiseptic oral rinse  7 mL Mouth Rinse BID  .  Brinzolamide-Brimonidine  1 drop Both Eyes BID  . Chlorhexidine Gluconate Cloth  6 each Topical Q0600  . heparin  5,000 Units Subcutaneous 3 times per day  . insulin aspart  0-9 Units Subcutaneous TID WC  . insulin aspart  4 Units Subcutaneous TID WC  . insulin glargine  30 Units Subcutaneous BID  . levothyroxine  50 mcg Oral QAC breakfast  . metoprolol tartrate  25 mg Oral BID  . mupirocin ointment  1 application Nasal BID  . nystatin   Topical TID  . pregabalin  75 mg Oral QPM  . sodium chloride  10-40 mL Intracatheter Q12H  . sodium chloride  10-40 mL Intracatheter Q12H  . tiotropium  18 mcg Inhalation Daily    Infusions: . DOPamine Stopped (06/16/14 0100)  . furosemide  (LASIX) infusion 15 mg/hr (06/19/14 0815)  . milrinone 0.25 mcg/kg/min (06/19/14 0815)    PRN Medications: albuterol, methocarbamol, morphine injection, oxyCODONE, sodium chloride, sodium chloride, sorbitol   Assessment:  1. A/C Diastolic HF ECHO EF 55-60%  2. Mitral Valve Stenosis 3. Obesity 4. DM --> Hgb A1C 7 5. Cardio Renal Syndrome 6. Hypothyroidism --> TSH 18.36  7. Immobility  8. Elevated PA pressure 77 mm hg 9. HTN    Plan/Discussion:     Continues to diurese. CVP back up today to 21. Will continue current regiment, Renal function improving. Will cut milrinone to 0.125 and correct for new weight. BUN still remains very high but no uremic symptoms. We will see what happens once we stop diuresis. May be getting close to need for HD as outpatient to maintain volume status.   Consider RHC prior to d/c to assess pulmonary pressures.  Will need SNF on d/c.   Daniel Bensimhon,MD 12:16 PM   Advanced Heart Failure Team Pager [phone] (M-F; 7a - 4p)  Please contact [facility] for night-coverage after hours (4p -7a ) and weekends on [email or web address]

## 2014-06-19 NOTE — Progress Notes (Addendum)
[facility] PROGRESS NOTE  Assessment/Plan: Acute on chronic diastolic CHF exacerbation  -I/O cont to be negative; and finally with good urine output -Unknown dry weight. Massive fluid overload on exam -will follow rec's from HF team, continue on milrinone and lasix GGT. -might need RHC once diuresed   Candida intertrigo: -continue nystatin powder.  Acute on chronic kidney disease stage III: - Baseline Cr 1.6, increase in Cr most likely due to HF and poor perfusion. - Chronic component, Secondary to uncontrol diabetes and hypertension.  - Acute component due to vol overload. Renal U/S no good windows; but no frank obstruction appreciated -Cr 2.97 and BUN 103 (better today 10/9); will follow closely -urine output improving and good diuresis present   New onset hypothyroidism: - TSH on admission was 18.36. No prior history of hypothyroidism.  - continue synthroid. - will need Repeat TSH in 6 weeks.  Hyponatremia  -Likely secondary to a hypervolemic hyponatremia and hyperglycemia -Resolved with diuresis and insulin treatment. -will monitor  Chronic respiratory failure/COPD  -breathing improving -will need Sleep study as an outpatient; high probability for OSA.  Diabetes mellitus with mild hypoglycemia -Cont Lantus (will adjust to just 30 units BID) and will change SSI to sensitive. -no bedtime coverage   Physical deconditioning -will need SNF at discharge -continue PT  Prophylaxis  -continue Heparin for DVT prophylaxis.   Code Status: Full  Family Communication: Updated patient no family at bedside.  Disposition Plan: SNF when medically stable.   Consultants:  none  Procedures:  Renal U/s - 2-D echo has been obtained that shows grade 2 diastolic dysfunction with EF of 55-60% with no wall motion abnormalities. - Renal function trending down.   Antibiotics:  None  HPI/Subjective: Reports breathing is better. Patient still with significant fluid  overload signs on exam. Brisk diuresis continue; Cr stable to improved; interesting enough weight is up this morning (believe is not accurate). With hypoglycemia this morning.  Objective: Filed Vitals:   06/19/14 0500 06/19/14 0700 06/19/14 0840 06/19/14 0900  BP:   107/49   Pulse: 106 95  98  Temp:   98.1 F (36.7 C)   TempSrc:   Oral   Resp: 24 17 14 31   Height:      Weight:      SpO2: 93% 95% 97% 100%    Intake/Output Summary (Last 24 hours) at 06/19/14 1007 Last data filed at 06/19/14 0900  Gross per 24 hour  Intake  901.8 ml  Output   3525 ml  Net -2623.2 ml   Filed Weights   06/17/14 0300 06/18/14 0500 06/19/14 0352  Weight: 129.275 kg (285 lb) 128 kg (282 lb 3 oz) 132 kg (291 lb 0.1 oz)    Exam:  General: Alert, awake, oriented x3, afebrile; denies CP and reports breathing continue to improve. Early this morning with hypoglycemic event. Patient still with significant fluid overload signs on exam. HEENT: No bruits, no goiter. Positive JVD Heart: Regular rate, no rubs or gallops; 3++ edema bilaterally Lungs: decrease BS at bases, no wheezing Abdomen: Soft, nontender, positive BS; significant edema around abdomen and sacrum  Data Reviewed: Basic Metabolic Panel:  Recent Labs Lab 06/15/14 2210 06/16/14 0450 06/17/14 1350 06/18/14 0440 06/19/14 0235  NA 135* 137 137 139 142  K 4.1 3.9 3.7 3.3* 3.5*  CL 90* 90* 90* 90* 89*  CO2 34* 34* 37* 39* 39*  GLUCOSE 238* 207* 59* 56* 39*  BUN 98* 101* 105* 105* 103*  CREATININE 3.20* 3.25*  3.22* 3.09* 2.97*  CALCIUM 8.4 8.3* 8.9 9.1 9.2   Liver Function Tests:  Recent Labs Lab 06/13/14 0404  AST 29  ALT 11  ALKPHOS 127*  BILITOT 0.8  PROT 8.1  ALBUMIN 3.0*   CBC:  Recent Labs Lab 06/15/14 0315 06/16/14 0450 06/17/14 0504 06/18/14 0440 06/19/14 0235  WBC 9.8 9.8 9.0 9.6 12.2*  NEUTROABS 7.8* 8.2* 7.2 7.7 9.8*  HGB 10.6* 9.1* 8.8* 9.0* 9.0*  HCT 32.9* 28.1* 27.0* 28.6* 27.9*  MCV 91.9 90.6 90.3  92.3 91.5  PLT 186 172 154 168 196   BNP (last 3 results)  Recent Labs  09/10/13 1827 10/13/13 1916 06/09/14 1818  PROBNP 4050.0* 1519.0* 4358.0*   CBG:  Recent Labs Lab 06/18/14 1643 06/18/14 2140 06/19/14 0533 06/19/14 0551 06/19/14 0840  GLUCAP 101* 101* 41* 95 58*    Recent Results (from the past 240 hour(s))  MRSA PCR SCREENING     Status: Abnormal   Collection Time    06/15/14  2:30 PM      Result Value Ref Range Status   MRSA by PCR POSITIVE (*) NEGATIVE Final   Comment:            The GeneXpert MRSA Assay (FDA     approved for NASAL specimens     only), is one component of a     comprehensive MRSA colonization     surveillance program. It is not     intended to diagnose MRSA     infection nor to guide or     monitor treatment for     MRSA infections.     RESULT CALLED TO, READ BACK BY AND VERIFIED WITH:     Harriett RushJ. COVINGTON RN 16:10 06/15/14 ([email or web address])     Studies: No results found.  Scheduled Meds: . antiseptic oral rinse  7 mL Mouth Rinse BID  . Brinzolamide-Brimonidine  1 drop Both Eyes BID  . Chlorhexidine Gluconate Cloth  6 each Topical Q0600  . heparin  5,000 Units Subcutaneous 3 times per day  . insulin aspart  0-9 Units Subcutaneous TID WC  . insulin aspart  4 Units Subcutaneous TID WC  . insulin glargine  55 Units Subcutaneous BID  . levothyroxine  50 mcg Oral QAC breakfast  . metoprolol tartrate  25 mg Oral BID  . mupirocin ointment  1 application Nasal BID  . nystatin   Topical TID  . pregabalin  75 mg Oral QPM  . sodium chloride  10-40 mL Intracatheter Q12H  . tiotropium  18 mcg Inhalation Daily   Continuous Infusions: . DOPamine Stopped (06/16/14 0100)  . furosemide (LASIX) infusion 15 mg/hr (06/19/14 0815)  . milrinone 0.25 mcg/kg/min (06/19/14 0815)   Time: < 30 minutes  Vassie LollMadera, Yelena Metzer  [facility] Pager [phone]. If 8PM-8AM, please contact night-coverage at [email or web address], password [email or web address] 06/19/2014, 10:07 AM  LOS: 10 days

## 2014-06-20 DIAGNOSIS — E873 Alkalosis: Secondary | ICD-10-CM

## 2014-06-20 LAB — GLUCOSE, CAPILLARY
GLUCOSE-CAPILLARY: 151 mg/dL — AB (ref 70–99)
GLUCOSE-CAPILLARY: 186 mg/dL — AB (ref 70–99)
GLUCOSE-CAPILLARY: 171 mg/dL — AB (ref 70–99)
Glucose-Capillary: 125 mg/dL — ABNORMAL HIGH (ref 70–99)
Glucose-Capillary: 208 mg/dL — ABNORMAL HIGH (ref 70–99)
Glucose-Capillary: 70 mg/dL (ref 70–99)
Glucose-Capillary: 78 mg/dL (ref 70–99)

## 2014-06-20 LAB — CBC WITH DIFFERENTIAL/PLATELET
Basophils Absolute: 0 10*3/uL (ref 0.0–0.1)
Basophils Relative: 0 % (ref 0–1)
Eosinophils Absolute: 0.2 10*3/uL (ref 0.0–0.7)
Eosinophils Relative: 3 % (ref 0–5)
HCT: 26.5 % — ABNORMAL LOW (ref 36.0–46.0)
Hemoglobin: 8.3 g/dL — ABNORMAL LOW (ref 12.0–15.0)
Lymphocytes Relative: 9 % — ABNORMAL LOW (ref 12–46)
Lymphs Abs: 0.7 10*3/uL (ref 0.7–4.0)
MCH: 29.1 pg (ref 26.0–34.0)
MCHC: 31.3 g/dL (ref 30.0–36.0)
MCV: 93 fL (ref 78.0–100.0)
Monocytes Absolute: 0.6 10*3/uL (ref 0.1–1.0)
Monocytes Relative: 7 % (ref 3–12)
Neutro Abs: 6.7 10*3/uL (ref 1.7–7.7)
Neutrophils Relative %: 81 % — ABNORMAL HIGH (ref 43–77)
Platelets: 176 10*3/uL (ref 150–400)
RBC: 2.85 MIL/uL — ABNORMAL LOW (ref 3.87–5.11)
RDW: 15.2 % (ref 11.5–15.5)
WBC: 8.3 10*3/uL (ref 4.0–10.5)

## 2014-06-20 LAB — BASIC METABOLIC PANEL
Anion gap: 8 (ref 5–15)
BUN: 102 mg/dL — AB (ref 6–23)
CO2: 42 meq/L — AB (ref 19–32)
CREATININE: 2.87 mg/dL — AB (ref 0.50–1.10)
Calcium: 9 mg/dL (ref 8.4–10.5)
Chloride: 89 mEq/L — ABNORMAL LOW (ref 96–112)
GFR calc Af Amer: 20 mL/min — ABNORMAL LOW (ref >90)
GFR calc non Af Amer: 17 mL/min — ABNORMAL LOW (ref >90)
Glucose, Bld: 108 mg/dL — ABNORMAL HIGH (ref 70–99)
POTASSIUM: 3.7 meq/L (ref 3.7–5.3)
Sodium: 139 mEq/L (ref 137–147)

## 2014-06-20 LAB — CARBOXYHEMOGLOBIN
CARBOXYHEMOGLOBIN: 2.5 % — AB (ref 0.5–1.5)
Methemoglobin: 0.5 % (ref 0.0–1.5)
O2 Saturation: 96.6 %
Total hemoglobin: 8.4 g/dL — ABNORMAL LOW (ref 12.0–16.0)

## 2014-06-20 MED ORDER — ACETAZOLAMIDE ER 500 MG PO CP12
500.0000 mg | ORAL_CAPSULE | Freq: Two times a day (BID) | ORAL | Status: DC
  Administered 2014-06-20 – 2014-06-21 (×4): 500 mg via ORAL
  Filled 2014-06-20 (×6): qty 1

## 2014-06-20 MED ORDER — MORPHINE SULFATE 2 MG/ML IJ SOLN
2.0000 mg | INTRAMUSCULAR | Status: DC | PRN
  Administered 2014-06-22: 2 mg via INTRAVENOUS
  Filled 2014-06-20: qty 1

## 2014-06-20 MED ORDER — ONDANSETRON HCL 4 MG/2ML IJ SOLN
4.0000 mg | Freq: Four times a day (QID) | INTRAMUSCULAR | Status: DC | PRN
  Administered 2014-06-20 – 2014-07-04 (×3): 4 mg via INTRAVENOUS
  Filled 2014-06-20 (×3): qty 2

## 2014-06-20 NOTE — Progress Notes (Signed)
[facility] PROGRESS NOTE  Assessment/Plan: Acute on chronic diastolic CHF exacerbation  -I/O cont to be negative; and finally with good urine output -Unknown dry weight. Massive fluid overload on exam -will follow rec's from HF team, continue on milrinone and lasix GGT. -might need RHC once diuresed   Candida intertrigo: -continue nystatin powder.  Acute on chronic kidney disease stage III: - Baseline Cr 1.6, increase in Cr most likely due to HF and poor perfusion. - Chronic component, Secondary to uncontrol diabetes and hypertension.  - Acute component due to vol overload. Renal U/S no good windows; but no frank obstruction appreciated -Cr 2.87 and BUN 102 (better today 10/10); will follow closely -urine output improving and good diuresis present   Patient CO2 42: due to contraction alkalosis -will add diamox -follow electrolytes -patient with good O2 sat  New onset hypothyroidism: - TSH on admission was 18.36. No prior history of hypothyroidism.  - continue synthroid. - will need Repeat TSH in 6 weeks.  Hyponatremia  -Likely secondary to a hypervolemic hyponatremia and hyperglycemia -Resolved with diuresis and insulin treatment. -will monitor  Chronic respiratory failure/COPD  -breathing improving -will need Sleep study as an outpatient; high probability for OSA.  Diabetes mellitus with mild hypoglycemia -Cont Lantus at current dose (30 units BID) and SSI sensitive. -no bedtime coverage  -no further hypoglycemic event; highest CBG in 180 range   Physical deconditioning -will need SNF at discharge -continue PT  Prophylaxis  -continue Heparin for DVT prophylaxis.   Code Status: Full  Family Communication: Updated patient no family at bedside.  Disposition Plan: SNF when medically stable.   Consultants:  none  Procedures:  Renal U/s - 2-D echo has been obtained that shows grade 2 diastolic dysfunction with EF of 55-60% with no wall motion  abnormalities.   Antibiotics:  None  HPI/Subjective: Reports breathing is better. Patient still with significant fluid overload signs on exam. After milrinone decrease urine output has decreased; weight remained stable overnight. Cr level continue improving. No chest pain  Objective: Filed Vitals:   06/20/14 0415 06/20/14 0800 06/20/14 0843 06/20/14 1200  BP: 148/42 127/55  104/47  Pulse: 91 92  89  Temp:  97.9 F (36.6 C)  97.9 F (36.6 C)  TempSrc:  Oral  Oral  Resp: 17 17  17   Height:      Weight:      SpO2: 97% 95% 93% 94%    Intake/Output Summary (Last 24 hours) at 06/20/14 1358 Last data filed at 06/20/14 1300  Gross per 24 hour  Intake  979.2 ml  Output   1550 ml  Net -570.8 ml   Filed Weights   06/18/14 0500 06/19/14 0352 06/20/14 0410  Weight: 128 kg (282 lb 3 oz) 132 kg (291 lb 0.1 oz) 132 kg (291 lb 0.1 oz)    Exam:  General: Alert, awake, oriented x3, afebrile; denies CP and reports breathing continue to improve. No further hypoglycemic events. Patient still with fluid overload signs on exam. Sleepy  HEENT: No bruits, no goiter. Positive JVD Heart: Regular rate, no rubs or gallops; 3++ edema bilaterally Lungs: decrease BS at bases, no wheezing Abdomen: Soft, nontender, positive BS; significant edema around abdomen and sacrum  Data Reviewed: Basic Metabolic Panel:  Recent Labs Lab 06/16/14 0450 06/17/14 1350 06/18/14 0440 06/19/14 0235 06/20/14 0433  NA 137 137 139 142 139  K 3.9 3.7 3.3* 3.5* 3.7  CL 90* 90* 90* 89* 89*  CO2 34* 37* 39* 39*  42*  GLUCOSE 207* 59* 56* 39* 108*  BUN 101* 105* 105* 103* 102*  CREATININE 3.25* 3.22* 3.09* 2.97* 2.87*  CALCIUM 8.3* 8.9 9.1 9.2 9.0   CBC:  Recent Labs Lab 06/16/14 0450 06/17/14 0504 06/18/14 0440 06/19/14 0235 06/20/14 0433  WBC 9.8 9.0 9.6 12.2* 8.3  NEUTROABS 8.2* 7.2 7.7 9.8* 6.7  HGB 9.1* 8.8* 9.0* 9.0* 8.3*  HCT 28.1* 27.0* 28.6* 27.9* 26.5*  MCV 90.6 90.3 92.3 91.5 93.0  PLT 172  154 168 196 176   BNP (last 3 results)  Recent Labs  09/10/13 1827 10/13/13 1916 06/09/14 1818  PROBNP 4050.0* 1519.0* 4358.0*   CBG:  Recent Labs Lab 06/20/14 0031 06/20/14 0149 06/20/14 0539 06/20/14 0825 06/20/14 1115  GLUCAP 70 78 125* 151* 186*    Recent Results (from the past 240 hour(s))  MRSA PCR SCREENING     Status: Abnormal   Collection Time    06/15/14  2:30 PM      Result Value Ref Range Status   MRSA by PCR POSITIVE (*) NEGATIVE Final   Comment:            The GeneXpert MRSA Assay (FDA     approved for NASAL specimens     only), is one component of a     comprehensive MRSA colonization     surveillance program. It is not     intended to diagnose MRSA     infection nor to guide or     monitor treatment for     MRSA infections.     RESULT CALLED TO, READ BACK BY AND VERIFIED WITH:     Harriett RushJ. COVINGTON RN 16:10 06/15/14 ([email or web address])     Studies: No results found.  Scheduled Meds: . acetaZOLAMIDE  500 mg Oral Q12H  . antiseptic oral rinse  7 mL Mouth Rinse BID  . Brinzolamide-Brimonidine  1 drop Both Eyes BID  . heparin  5,000 Units Subcutaneous 3 times per day  . insulin aspart  0-9 Units Subcutaneous TID WC  . insulin aspart  4 Units Subcutaneous TID WC  . insulin glargine  30 Units Subcutaneous BID  . levothyroxine  50 mcg Oral QAC breakfast  . metoprolol tartrate  25 mg Oral BID  . nystatin   Topical TID  . potassium chloride  20 mEq Oral Daily  . pregabalin  75 mg Oral QPM  . sodium chloride  10-40 mL Intracatheter Q12H  . sodium chloride  10-40 mL Intracatheter Q12H  . tiotropium  18 mcg Inhalation Daily   Continuous Infusions: . furosemide (LASIX) infusion 15 mg/hr (06/20/14 1300)  . milrinone 0.25 mcg/kg/min (06/20/14 1300)   Time: < 30 minutes  Vassie LollMadera, Musette Kisamore  [facility] Pager [phone]. If 8PM-8AM, please contact night-coverage at [email or web address], password [email or web address] 06/20/2014, 1:58 PM  LOS: 11 days

## 2014-06-20 NOTE — Progress Notes (Signed)
CRITICAL VALUE ALERT  Critical value received:  CO2 0.42  Date of notification:06/20/2014  Time of notification:  0550  Critical value read back:Yes.    Nurse who received alert:  Shelly Bombardarol Ann Camdynn Maranto  MD notified (1st page): Lenny Pastelom Callahan, NP  Time of first page:  [phone]  MD notified (2nd page):  Time of second page:  Responding MD: No changes made,NP stated "she probably hangs out at this number. Will share with team today"  .

## 2014-06-20 NOTE — Progress Notes (Signed)
Advanced Heart Failure Rounding Note   Subjective:    58 y/o admitted with diastolic HD, anasarca and a/c renal failure.   Continues on lasix drip + milrinone 0.25 mcg. Diuresing slowed a bit. Weight stable overnight.    Remains essentially bedbound.  No orthopnea/PND. Bicarb up to 42. Milrinone cut to 0.125 yesterday.   Renal Ultrasound - inconclusive ECHO 06/10/14 EF 55-60% Grade II DD MV moderate stenosis. Peak PA pressure 77  Creatinine 3.25 >3.09> 2.97>2.87 Co-ox 72% BUN 105->103->102 Admit BUN/CR 56/2.06  Objective:   Weight Range:  Vital Signs:   Temp:  [98 F (36.7 C)-98.8 F (37.1 C)] 98.2 F (36.8 C) (10/10 0410) Pulse Rate:  [88-98] 91 (10/10 0415) Resp:  [14-31] 17 (10/10 0415) BP: (101-148)/(24-76) 148/42 mmHg (10/10 0415) SpO2:  [90 %-100 %] 97 % (10/10 0415) Weight:  [132 kg (291 lb 0.1 oz)] 132 kg (291 lb 0.1 oz) (10/10 0410) Last BM Date: 06/15/14  Weight change: Filed Weights   06/18/14 0500 06/19/14 0352 06/20/14 0410  Weight: 128 kg (282 lb 3 oz) 132 kg (291 lb 0.1 oz) 132 kg (291 lb 0.1 oz)    Intake/Output:   Intake/Output Summary (Last 24 hours) at 06/20/14 0815 Last data filed at 06/20/14 0600  Gross per 24 hour  Intake 561.15 ml  Output   1700 ml  Net -1138.85 ml     Physical Exam: CVP ~22 General: Lying in bed. No resp difficulty HEENT: normal Neck: supple. LIJ TLC. JVP  jaw . Carotids 2+ bilat; no bruits. No lymphadenopathy or thryomegaly appreciated. RIJ  Cor: PMI nondisplaced. Regular rate & rhythm. No rubs, gallops or murmurs. Lungs: clear Abdomen: obese, nontender, +distended. No hepatosplenomegaly. No bruits or masses. Good bowel sounds. Rash under pannus  Extremities: no cyanosis, clubbing, rash, R and LLE 1-2 edema  Neuro: alert & orientedx3, cranial nerves grossly intact. moves all 4 extremities w/o difficulty. Affect pleasant  Telemetry: SR 90s   Labs: Basic Metabolic Panel:  Recent Labs Lab 06/16/14 0450  06/17/14 1350 06/18/14 0440 06/19/14 0235 06/20/14 0433  NA 137 137 139 142 139  K 3.9 3.7 3.3* 3.5* 3.7  CL 90* 90* 90* 89* 89*  CO2 34* 37* 39* 39* 42*  GLUCOSE 207* 59* 56* 39* 108*  BUN 101* 105* 105* 103* 102*  CREATININE 3.25* 3.22* 3.09* 2.97* 2.87*  CALCIUM 8.3* 8.9 9.1 9.2 9.0    Liver Function Tests: No results found for this basename: AST, ALT, ALKPHOS, BILITOT, PROT, ALBUMIN,  in the last 168 hours No results found for this basename: LIPASE, AMYLASE,  in the last 168 hours No results found for this basename: AMMONIA,  in the last 168 hours  CBC:  Recent Labs Lab 06/16/14 0450 06/17/14 0504 06/18/14 0440 06/19/14 0235 06/20/14 0433  WBC 9.8 9.0 9.6 12.2* 8.3  NEUTROABS 8.2* 7.2 7.7 9.8* 6.7  HGB 9.1* 8.8* 9.0* 9.0* 8.3*  HCT 28.1* 27.0* 28.6* 27.9* 26.5*  MCV 90.6 90.3 92.3 91.5 93.0  PLT 172 154 168 196 176    Cardiac Enzymes: No results found for this basename: CKTOTAL, CKMB, CKMBINDEX, TROPONINI,  in the last 168 hours  BNP: BNP (last 3 results)  Recent Labs  09/10/13 1827 10/13/13 1916 06/09/14 1818  PROBNP 4050.0* 1519.0* 4358.0*     Other results:    Imaging: No results found.   Medications:     Scheduled Medications: . antiseptic oral rinse  7 mL Mouth Rinse BID  . Brinzolamide-Brimonidine  1 drop  Both Eyes BID  . Chlorhexidine Gluconate Cloth  6 each Topical Q0600  . heparin  5,000 Units Subcutaneous 3 times per day  . insulin aspart  0-9 Units Subcutaneous TID WC  . insulin aspart  4 Units Subcutaneous TID WC  . insulin glargine  30 Units Subcutaneous BID  . levothyroxine  50 mcg Oral QAC breakfast  . metolazone  2.5 mg Oral Daily  . metoprolol tartrate  25 mg Oral BID  . mupirocin ointment  1 application Nasal BID  . nystatin   Topical TID  . potassium chloride  20 mEq Oral Daily  . pregabalin  75 mg Oral QPM  . sodium chloride  10-40 mL Intracatheter Q12H  . sodium chloride  10-40 mL Intracatheter Q12H  . tiotropium   18 mcg Inhalation Daily    Infusions: . furosemide (LASIX) infusion 15 mg/hr (06/20/14 0400)  . milrinone 0.25 mcg/kg/min (06/20/14 0128)    PRN Medications: albuterol, methocarbamol, morphine injection, oxyCODONE, sodium chloride, sodium chloride, sorbitol   Assessment:  1. A/C Diastolic HF ECHO EF 55-60%  2. Mitral Valve Stenosis 3. Obesity 4. DM --> Hgb A1C 7 5. Cardio Renal Syndrome 6. Hypothyroidism --> TSH 18.36  7. Immobility  8. Elevated PA pressure 77 mm hg 9. HTN    Plan/Discussion:    Diuresis slowing. CVP remains at 22. Bicarb now up to 42. Will change metolazone to diamox and see how she responds. BUN still remains very high but no uremic symptoms. Will plan RHC early next week. May be getting close to need for HD as outpatient to maintain volume status. Will contact Renal on Monday  Will need SNF on d/c as sheis 4-person assist to chair  Reuel Boomaniel Freddy Spadafora,MD 8:15 AM  Advanced Heart Failure Team Pager [phone] (M-F; 7a - 4p)  Please contact [facility] for night-coverage after hours (4p -7a ) and weekends on [email or web address]

## 2014-06-21 LAB — CBC WITH DIFFERENTIAL/PLATELET
BASOS PCT: 0 % (ref 0–1)
Basophils Absolute: 0 10*3/uL (ref 0.0–0.1)
EOS PCT: 4 % (ref 0–5)
Eosinophils Absolute: 0.3 10*3/uL (ref 0.0–0.7)
HCT: 26.9 % — ABNORMAL LOW (ref 36.0–46.0)
Hemoglobin: 8.5 g/dL — ABNORMAL LOW (ref 12.0–15.0)
LYMPHS ABS: 0.8 10*3/uL (ref 0.7–4.0)
Lymphocytes Relative: 10 % — ABNORMAL LOW (ref 12–46)
MCH: 30 pg (ref 26.0–34.0)
MCHC: 31.6 g/dL (ref 30.0–36.0)
MCV: 95.1 fL (ref 78.0–100.0)
Monocytes Absolute: 0.6 10*3/uL (ref 0.1–1.0)
Monocytes Relative: 8 % (ref 3–12)
Neutro Abs: 6.7 10*3/uL (ref 1.7–7.7)
Neutrophils Relative %: 78 % — ABNORMAL HIGH (ref 43–77)
Platelets: 177 10*3/uL (ref 150–400)
RBC: 2.83 MIL/uL — AB (ref 3.87–5.11)
RDW: 15.3 % (ref 11.5–15.5)
WBC: 8.5 10*3/uL (ref 4.0–10.5)

## 2014-06-21 LAB — BASIC METABOLIC PANEL
Anion gap: 11 (ref 5–15)
BUN: 100 mg/dL — AB (ref 6–23)
CO2: 41 meq/L — AB (ref 19–32)
Calcium: 9 mg/dL (ref 8.4–10.5)
Chloride: 85 mEq/L — ABNORMAL LOW (ref 96–112)
Creatinine, Ser: 2.87 mg/dL — ABNORMAL HIGH (ref 0.50–1.10)
GFR calc Af Amer: 20 mL/min — ABNORMAL LOW (ref >90)
GFR calc non Af Amer: 17 mL/min — ABNORMAL LOW (ref >90)
Glucose, Bld: 163 mg/dL — ABNORMAL HIGH (ref 70–99)
POTASSIUM: 3.6 meq/L — AB (ref 3.7–5.3)
SODIUM: 137 meq/L (ref 137–147)

## 2014-06-21 LAB — GLUCOSE, CAPILLARY
GLUCOSE-CAPILLARY: 191 mg/dL — AB (ref 70–99)
Glucose-Capillary: 172 mg/dL — ABNORMAL HIGH (ref 70–99)
Glucose-Capillary: 218 mg/dL — ABNORMAL HIGH (ref 70–99)
Glucose-Capillary: 194 mg/dL — ABNORMAL HIGH (ref 70–99)

## 2014-06-21 LAB — CARBOXYHEMOGLOBIN
Carboxyhemoglobin: 2.1 % — ABNORMAL HIGH (ref 0.5–1.5)
Methemoglobin: 0.8 % (ref 0.0–1.5)
O2 Saturation: 77.5 %
Total hemoglobin: 8.6 g/dL — ABNORMAL LOW (ref 12.0–16.0)

## 2014-06-21 MED ORDER — POTASSIUM CHLORIDE CRYS ER 20 MEQ PO TBCR
20.0000 meq | EXTENDED_RELEASE_TABLET | Freq: Once | ORAL | Status: AC
  Administered 2014-06-21: 20 meq via ORAL
  Filled 2014-06-21: qty 1

## 2014-06-21 MED ORDER — METOLAZONE 5 MG PO TABS
5.0000 mg | ORAL_TABLET | Freq: Two times a day (BID) | ORAL | Status: DC
  Administered 2014-06-21 – 2014-06-22 (×3): 5 mg via ORAL
  Filled 2014-06-21 (×4): qty 1

## 2014-06-21 NOTE — Progress Notes (Signed)
[facility] PROGRESS NOTE  Assessment/Plan: Acute on chronic diastolic CHF exacerbation  -I/O cont to be negative; and finally with good urine output -Unknown dry weight. Massive fluid overload on exam -will follow rec's from HF team, continue on milrinone and lasix GGT. -might need RHC once diuresed   Candida intertrigo: -continue nystatin powder.  Acute on chronic kidney disease stage III: - Baseline Cr 1.6, increase in Cr most likely due to HF and poor perfusion. - Chronic component, Secondary to uncontrol diabetes and hypertension.  - Acute component due to vol overload. Renal U/S no good windows; but no frank obstruction appreciated -Cr 2.87 and BUN 100 (essentially unchanged from 10/10); will follow closely -urine output improving and good diuresis present   Patient CO2 42: due to contraction alkalosis -will continue diamox -follow electrolytes -patient with good O2 sat on current oxygen supplementation -CO2 down to 41  New onset hypothyroidism: - TSH on admission was 18.36. No prior history of hypothyroidism.  - continue synthroid. - will need Repeat TSH in 6 weeks.  Hyponatremia  -Likely secondary to a hypervolemic hyponatremia and hyperglycemia -Resolved with diuresis and insulin treatment. -will monitor; last Sodium 137 (10/11)  Chronic respiratory failure/COPD  -breathing improving -will need Sleep study as an outpatient; high probability for OSA.  Diabetes mellitus with mild hypoglycemia -Cont Lantus at current dose (30 units BID) and SSI sensitive. -no SSI bedtime coverage  -no further hypoglycemic event; highest CBG in 207 range   Physical deconditioning -will need SNF at discharge -continue PT  Prophylaxis  -continue Heparin for DVT prophylaxis.   Code Status: Full  Family Communication: Updated patient and her brothers at bedside.  Disposition Plan: SNF when medically stable.   Consultants:  none  Procedures:  Renal U/s - 2-D  echo has been obtained that shows grade 2 diastolic dysfunction with EF of 55-60% with no wall motion abnormalities.   Antibiotics:  None  HPI/Subjective: Alert, awake, oriented x3, afebrile; denies CP and reports breathing continue to improve. No further hypoglycemic events appreciated. Patient still with fluid overload signs on exam. Good urine output continues.  Objective: Filed Vitals:   06/21/14 0754 06/21/14 0800 06/21/14 0900 06/21/14 1000  BP:  133/42 141/40 119/26  Pulse:  86 92 92  Temp:      TempSrc:      Resp:  20 21 22   Height:      Weight:      SpO2: 96% 96% 94% 93%    Intake/Output Summary (Last 24 hours) at 06/21/14 1053 Last data filed at 06/21/14 1000  Gross per 24 hour  Intake 670.56 ml  Output   2075 ml  Net -1404.44 ml   Filed Weights   06/19/14 0352 06/20/14 0410 06/21/14 0500  Weight: 132 kg (291 lb 0.1 oz) 132 kg (291 lb 0.1 oz) 129 kg (284 lb 6.3 oz)    Exam:  General: Alert, awake, oriented x3, afebrile; denies CP and reports breathing continue to improve. No further hypoglycemic events appreciated. Patient still with fluid overload signs on exam. Good urine output continues. HEENT: No bruits, no goiter. Positive JVD Heart: Regular rate, no rubs or gallops; 2-3++ edema bilaterally Lungs: decrease BS at bases, no wheezing Abdomen: Soft, nontender, positive BS; significant edema around abdomen and sacrum  Data Reviewed: Basic Metabolic Panel:  Recent Labs Lab 06/17/14 1350 06/18/14 0440 06/19/14 0235 06/20/14 0433 06/21/14 0500  NA 137 139 142 139 137  K 3.7 3.3* 3.5* 3.7 3.6*  CL 90*  90* 89* 89* 85*  CO2 37* 39* 39* 42* 41*  GLUCOSE 59* 56* 39* 108* 163*  BUN 105* 105* 103* 102* 100*  CREATININE 3.22* 3.09* 2.97* 2.87* 2.87*  CALCIUM 8.9 9.1 9.2 9.0 9.0   CBC:  Recent Labs Lab 06/17/14 0504 06/18/14 0440 06/19/14 0235 06/20/14 0433 06/21/14 0500  WBC 9.0 9.6 12.2* 8.3 8.5  NEUTROABS 7.2 7.7 9.8* 6.7 6.7  HGB 8.8* 9.0*  9.0* 8.3* 8.5*  HCT 27.0* 28.6* 27.9* 26.5* 26.9*  MCV 90.3 92.3 91.5 93.0 95.1  PLT 154 168 196 176 177   BNP (last 3 results)  Recent Labs  09/10/13 1827 10/13/13 1916 06/09/14 1818  PROBNP 4050.0* 1519.0* 4358.0*   CBG:  Recent Labs Lab 06/20/14 0825 06/20/14 1115 06/20/14 1702 06/20/14 2157 06/21/14 0740  GLUCAP 151* 186* 208* 171* 172*    Recent Results (from the past 240 hour(s))  MRSA PCR SCREENING     Status: Abnormal   Collection Time    06/15/14  2:30 PM      Result Value Ref Range Status   MRSA by PCR POSITIVE (*) NEGATIVE Final   Comment:            The GeneXpert MRSA Assay (FDA     approved for NASAL specimens     only), is one component of a     comprehensive MRSA colonization     surveillance program. It is not     intended to diagnose MRSA     infection nor to guide or     monitor treatment for     MRSA infections.     RESULT CALLED TO, READ BACK BY AND VERIFIED WITH:     Harriett RushJ. COVINGTON RN 16:10 06/15/14 ([email or web address])     Studies: No results found.  Scheduled Meds: . acetaZOLAMIDE  500 mg Oral Q12H  . antiseptic oral rinse  7 mL Mouth Rinse BID  . Brinzolamide-Brimonidine  1 drop Both Eyes BID  . heparin  5,000 Units Subcutaneous 3 times per day  . insulin aspart  0-9 Units Subcutaneous TID WC  . insulin aspart  4 Units Subcutaneous TID WC  . insulin glargine  30 Units Subcutaneous BID  . levothyroxine  50 mcg Oral QAC breakfast  . metolazone  5 mg Oral BID  . metoprolol tartrate  25 mg Oral BID  . nystatin   Topical TID  . potassium chloride  20 mEq Oral Daily  . potassium chloride  20 mEq Oral Once  . pregabalin  75 mg Oral QPM  . sodium chloride  10-40 mL Intracatheter Q12H  . sodium chloride  10-40 mL Intracatheter Q12H  . tiotropium  18 mcg Inhalation Daily   Continuous Infusions: . furosemide (LASIX) infusion 15 mg/hr (06/20/14 2046)  . milrinone 0.25 mcg/kg/min (06/21/14 0351)   Time: < 30 minutes  Vassie LollMadera, Jalani Cullifer  [facility] Pager [phone]. If 8PM-8AM, please contact night-coverage at [email or web address], password [email or web address] 06/21/2014, 10:53 AM  LOS: 12 days

## 2014-06-21 NOTE — Progress Notes (Signed)
Patient ID: Kimberly [name], female   DOB: Jan 20, 1956, 58 y.o.   MRN: [id number] Advanced Heart Failure Rounding Note   Subjective:    [age] y/o admitted with diastolic HD, anasarca and a/c renal failure.   Continues on lasix drip + milrinone 0.25 mcg. Diuresing slowed.   Remains essentially bedbound.  No orthopnea/PND. Bicarb 42=>41 after starting Diamox. Milrinone remains at 0.25.   Renal Ultrasound - inconclusive ECHO 06/10/14 EF 55-60% Grade II DD MV moderate stenosis. Peak PA pressure 77  Creatinine 3.25 >3.09> 2.97>2.87>2.87 Co-ox 72%>77% BUN 105->103->102>100 Admit BUN/CR 56/2.06  Objective:   Weight Range:  Vital Signs:   Temp:  [97.5 F (36.4 C)-98.3 F (36.8 C)] 97.5 F (36.4 C) (10/11 0736) Pulse Rate:  [83-94] 92 (10/11 1000) Resp:  [12-22] 22 (10/11 1000) BP: (87-141)/(25-81) 119/26 mmHg (10/11 1000) SpO2:  [89 %-98 %] 93 % (10/11 1000) Weight:  [284 lb 6.3 oz (129 kg)] 284 lb 6.3 oz (129 kg) (10/11 0500) Last BM Date: 06/15/14  Weight change: Filed Weights   06/19/14 0352 06/20/14 0410 06/21/14 0500  Weight: 291 lb 0.1 oz (132 kg) 291 lb 0.1 oz (132 kg) 284 lb 6.3 oz (129 kg)    Intake/Output:   Intake/Output Summary (Last 24 hours) at 06/21/14 1025 Last data filed at 06/21/14 1000  Gross per 24 hour  Intake  919.2 ml  Output   2075 ml  Net -1155.8 ml     Physical Exam: CVP ~19 General: Lying in bed. No resp difficulty HEENT: normal Neck: supple. LIJ TLC. JVP  jaw . Carotids 2+ bilat; no bruits. No lymphadenopathy or thryomegaly appreciated. RIJ  Cor: PMI nondisplaced. Regular rate & rhythm. No rubs, gallops or murmurs. Lungs: clear Abdomen: obese, nontender, +distended. No hepatosplenomegaly. No bruits or masses. Good bowel sounds. Rash under pannus  Extremities: no cyanosis, clubbing, rash, R and LLE 1-2 edema  Neuro: alert & orientedx3, cranial nerves grossly intact. moves all 4 extremities w/o difficulty. Affect pleasant  Telemetry: SR 90s    Labs: Basic Metabolic Panel:  Recent Labs Lab 06/17/14 1350 06/18/14 0440 06/19/14 0235 06/20/14 0433 06/21/14 0500  NA 137 139 142 139 137  K 3.7 3.3* 3.5* 3.7 3.6*  CL 90* 90* 89* 89* 85*  CO2 37* 39* 39* 42* 41*  GLUCOSE 59* 56* 39* 108* 163*  BUN 105* 105* 103* 102* 100*  CREATININE 3.22* 3.09* 2.97* 2.87* 2.87*  CALCIUM 8.9 9.1 9.2 9.0 9.0    Liver Function Tests: No results found for this basename: AST, ALT, ALKPHOS, BILITOT, PROT, ALBUMIN,  in the last 168 hours No results found for this basename: LIPASE, AMYLASE,  in the last 168 hours No results found for this basename: AMMONIA,  in the last 168 hours  CBC:  Recent Labs Lab 06/17/14 0504 06/18/14 0440 06/19/14 0235 06/20/14 0433 06/21/14 0500  WBC 9.0 9.6 12.2* 8.3 8.5  NEUTROABS 7.2 7.7 9.8* 6.7 6.7  HGB 8.8* 9.0* 9.0* 8.3* 8.5*  HCT 27.0* 28.6* 27.9* 26.5* 26.9*  MCV 90.3 92.3 91.5 93.0 95.1  PLT 154 168 196 176 177    Cardiac Enzymes: No results found for this basename: CKTOTAL, CKMB, CKMBINDEX, TROPONINI,  in the last 168 hours  BNP: BNP (last 3 results)  Recent Labs  09/10/13 1827 10/13/13 1916 06/09/14 1818  PROBNP 4050.0* 1519.0* 4358.0*     Other results:    Imaging: No results found.   Medications:     Scheduled Medications: . acetaZOLAMIDE  500 mg  Oral Q12H  . antiseptic oral rinse  7 mL Mouth Rinse BID  . Brinzolamide-Brimonidine  1 drop Both Eyes BID  . heparin  5,000 Units Subcutaneous 3 times per day  . insulin aspart  0-9 Units Subcutaneous TID WC  . insulin aspart  4 Units Subcutaneous TID WC  . insulin glargine  30 Units Subcutaneous BID  . levothyroxine  50 mcg Oral QAC breakfast  . metoprolol tartrate  25 mg Oral BID  . nystatin   Topical TID  . potassium chloride  20 mEq Oral Daily  . pregabalin  75 mg Oral QPM  . sodium chloride  10-40 mL Intracatheter Q12H  . sodium chloride  10-40 mL Intracatheter Q12H  . tiotropium  18 mcg Inhalation Daily     Infusions: . furosemide (LASIX) infusion 15 mg/hr (06/20/14 2046)  . milrinone 0.25 mcg/kg/min (06/21/14 0351)    PRN Medications: albuterol, methocarbamol, morphine injection, ondansetron, oxyCODONE, sodium chloride, sodium chloride, sorbitol   Assessment:  1. A/C Diastolic HF ECHO EF 55-60%  2. Mitral Valve Stenosis 3. Obesity 4. DM --> Hgb A1C 7 5. Cardio Renal Syndrome 6. Hypothyroidism --> TSH 18.36  7. Immobility  8. Elevated PA pressure 77 mm hg 9. HTN    Plan/Discussion:    Diuresis slow despite Diamox started yesterday. CVP remains at 19. Bicarb 41, slightly lower. Continue Diamox but add back metolazone today. BUN still remains very high but no uremic symptoms and numbers are stable. RHC when CVP has come down some. May be getting close to need for HD as outpatient to maintain volume status. Will contact Renal on Monday  Will need SNF on d/c as she is 4-person assist to chair  Kimberly Robins,MD 10:25 AM  Advanced Heart Failure Team Pager [phone] (M-F; 7a - 4p)  Please contact [facility] for night-coverage after hours (4p -7a ) and weekends on [email or web address]

## 2014-06-21 NOTE — Clinical Social Work Note (Signed)
CSW consulted regarding patient's request to speak with CSW. CSW met with patient who reports concerns regarding her medical condition. Patient states she has questions and wants to know what's going on with her medically. CSW provided appropriate support and informed patient she will have to direct medical questions to medical team (MD, RN). Patient thanked CSW and states she did not know who to ask or what direction to go in. CSW informed patient RN will be made aware of concerns. No further needs. CSW to continue to follow for d/c planning needs.  [place], [place] Weekend Clinical Social Worker [phone]

## 2014-06-22 ENCOUNTER — Inpatient Hospital Stay (HOSPITAL_COMMUNITY): Payer: Managed Care, Other (non HMO)

## 2014-06-22 DIAGNOSIS — J9690 Respiratory failure, unspecified, unspecified whether with hypoxia or hypercapnia: Secondary | ICD-10-CM

## 2014-06-22 DIAGNOSIS — J9622 Acute and chronic respiratory failure with hypercapnia: Secondary | ICD-10-CM | POA: Diagnosis not present

## 2014-06-22 DIAGNOSIS — N189 Chronic kidney disease, unspecified: Secondary | ICD-10-CM

## 2014-06-22 DIAGNOSIS — N179 Acute kidney failure, unspecified: Secondary | ICD-10-CM | POA: Insufficient documentation

## 2014-06-22 LAB — POCT I-STAT 3, ART BLOOD GAS (G3+)
ACID-BASE EXCESS: 19 mmol/L — AB (ref 0.0–2.0)
ACID-BASE EXCESS: 19 mmol/L — AB (ref 0.0–2.0)
ACID-BASE EXCESS: 22 mmol/L — AB (ref 0.0–2.0)
Bicarbonate: 46.2 mEq/L — ABNORMAL HIGH (ref 20.0–24.0)
Bicarbonate: 48.1 mEq/L — ABNORMAL HIGH (ref 20.0–24.0)
Bicarbonate: 49.8 mEq/L — ABNORMAL HIGH (ref 20.0–24.0)
O2 SAT: 97 %
O2 SAT: 100 %
O2 Saturation: 98 %
PH ART: 7.441 (ref 7.350–7.450)
PO2 ART: 348 mmHg — AB (ref 80.0–100.0)
Patient temperature: 98.6
TCO2: 48 mmol/L (ref 0–100)
TCO2: >50 mmol/L (ref 0–100)
TCO2: >50 mmol/L (ref 0–100)
pCO2 arterial: 67.9 mmHg (ref 35.0–45.0)
pCO2 arterial: 89 mmHg (ref 35.0–45.0)
pCO2 arterial: 72 mmHg (ref 35.0–45.0)
pH, Arterial: 7.341 — ABNORMAL LOW (ref 7.350–7.450)
pH, Arterial: 7.445 (ref 7.350–7.450)
pO2, Arterial: 111 mmHg — ABNORMAL HIGH (ref 80.0–100.0)
pO2, Arterial: 99 mmHg (ref 80.0–100.0)

## 2014-06-22 LAB — BASIC METABOLIC PANEL
Anion gap: 11 (ref 5–15)
BUN: 101 mg/dL — ABNORMAL HIGH (ref 6–23)
CALCIUM: 9.3 mg/dL (ref 8.4–10.5)
CO2: 43 meq/L — AB (ref 19–32)
Chloride: 86 mEq/L — ABNORMAL LOW (ref 96–112)
Creatinine, Ser: 2.93 mg/dL — ABNORMAL HIGH (ref 0.50–1.10)
GFR calc Af Amer: 19 mL/min — ABNORMAL LOW (ref >90)
GFR calc non Af Amer: 16 mL/min — ABNORMAL LOW (ref >90)
GLUCOSE: 176 mg/dL — AB (ref 70–99)
Potassium: 3.8 mEq/L (ref 3.7–5.3)
SODIUM: 140 meq/L (ref 137–147)

## 2014-06-22 LAB — CBC WITH DIFFERENTIAL/PLATELET
BASOS ABS: 0 10*3/uL (ref 0.0–0.1)
Basophils Relative: 0 % (ref 0–1)
EOS ABS: 0.2 10*3/uL (ref 0.0–0.7)
EOS PCT: 3 % (ref 0–5)
HCT: 26.4 % — ABNORMAL LOW (ref 36.0–46.0)
Hemoglobin: 8.2 g/dL — ABNORMAL LOW (ref 12.0–15.0)
LYMPHS ABS: 0.6 10*3/uL — AB (ref 0.7–4.0)
Lymphocytes Relative: 7 % — ABNORMAL LOW (ref 12–46)
MCH: 29.9 pg (ref 26.0–34.0)
MCHC: 31.1 g/dL (ref 30.0–36.0)
MCV: 96.4 fL (ref 78.0–100.0)
Monocytes Absolute: 0.7 10*3/uL (ref 0.1–1.0)
Monocytes Relative: 8 % (ref 3–12)
Neutro Abs: 7.6 10*3/uL (ref 1.7–7.7)
Neutrophils Relative %: 82 % — ABNORMAL HIGH (ref 43–77)
Platelets: 185 10*3/uL (ref 150–400)
RBC: 2.74 MIL/uL — ABNORMAL LOW (ref 3.87–5.11)
RDW: 15.3 % (ref 11.5–15.5)
WBC: 9.1 10*3/uL (ref 4.0–10.5)

## 2014-06-22 LAB — CARBOXYHEMOGLOBIN
Carboxyhemoglobin: 2.5 % — ABNORMAL HIGH (ref 0.5–1.5)
Methemoglobin: 0.4 % (ref 0.0–1.5)
O2 Saturation: 75.1 %
Total hemoglobin: 8.3 g/dL — ABNORMAL LOW (ref 12.0–16.0)

## 2014-06-22 LAB — GLUCOSE, CAPILLARY
GLUCOSE-CAPILLARY: 183 mg/dL — AB (ref 70–99)
Glucose-Capillary: 181 mg/dL — ABNORMAL HIGH (ref 70–99)
Glucose-Capillary: 217 mg/dL — ABNORMAL HIGH (ref 70–99)

## 2014-06-22 MED ORDER — MILRINONE IN DEXTROSE 20 MG/100ML IV SOLN
0.1250 ug/kg/min | INTRAVENOUS | Status: DC
  Administered 2014-06-22: 0.125 ug/kg/min via INTRAVENOUS

## 2014-06-22 MED ORDER — MIDAZOLAM HCL 2 MG/2ML IJ SOLN
INTRAMUSCULAR | Status: AC
  Filled 2014-06-22: qty 2

## 2014-06-22 MED ORDER — BUSPIRONE HCL 5 MG PO TABS
5.0000 mg | ORAL_TABLET | Freq: Three times a day (TID) | ORAL | Status: DC
  Filled 2014-06-22 (×4): qty 1

## 2014-06-22 MED ORDER — ATROPINE SULFATE 0.1 MG/ML IJ SOLN
1.0000 mg | Freq: Once | INTRAMUSCULAR | Status: DC
  Filled 2014-06-22: qty 10

## 2014-06-22 MED ORDER — LIDOCAINE HCL (CARDIAC) 20 MG/ML IV SOLN
INTRAVENOUS | Status: AC
  Filled 2014-06-22: qty 5

## 2014-06-22 MED ORDER — ETOMIDATE 2 MG/ML IV SOLN
INTRAVENOUS | Status: AC
  Filled 2014-06-22: qty 20

## 2014-06-22 MED ORDER — FENTANYL CITRATE 0.05 MG/ML IJ SOLN
100.0000 ug | INTRAMUSCULAR | Status: DC | PRN
  Administered 2014-06-22: 100 ug via INTRAVENOUS
  Filled 2014-06-22: qty 2

## 2014-06-22 MED ORDER — ATROPINE SULFATE 1 MG/ML IJ SOLN
1.0000 mg | Freq: Once | INTRAMUSCULAR | Status: AC
  Administered 2014-06-22: 1 mg via INTRAVENOUS

## 2014-06-22 MED ORDER — INSULIN ASPART 100 UNIT/ML ~~LOC~~ SOLN
2.0000 [IU] | SUBCUTANEOUS | Status: DC
  Administered 2014-06-23: 2 [IU] via SUBCUTANEOUS
  Administered 2014-06-23 (×2): 4 [IU] via SUBCUTANEOUS

## 2014-06-22 MED ORDER — ATROPINE SULFATE 0.1 MG/ML IJ SOLN
INTRAMUSCULAR | Status: AC
  Filled 2014-06-22: qty 10

## 2014-06-22 MED ORDER — SUCCINYLCHOLINE CHLORIDE 20 MG/ML IJ SOLN
INTRAMUSCULAR | Status: AC
  Filled 2014-06-22: qty 1

## 2014-06-22 MED ORDER — METOLAZONE 5 MG PO TABS
5.0000 mg | ORAL_TABLET | Freq: Two times a day (BID) | ORAL | Status: DC
  Administered 2014-06-22 – 2014-06-23 (×2): 5 mg
  Filled 2014-06-22 (×5): qty 1

## 2014-06-22 MED ORDER — FUROSEMIDE 10 MG/ML IJ SOLN
160.0000 mg | Freq: Three times a day (TID) | INTRAVENOUS | Status: DC
  Administered 2014-06-22 – 2014-06-23 (×3): 160 mg via INTRAVENOUS
  Filled 2014-06-22 (×7): qty 16

## 2014-06-22 MED ORDER — MIDAZOLAM HCL 2 MG/2ML IJ SOLN
2.0000 mg | Freq: Once | INTRAMUSCULAR | Status: AC
  Administered 2014-06-22: 2 mg via INTRAVENOUS

## 2014-06-22 MED ORDER — FENTANYL CITRATE 0.05 MG/ML IJ SOLN
100.0000 ug | INTRAMUSCULAR | Status: DC | PRN
  Administered 2014-06-22: 100 ug via INTRAVENOUS
  Filled 2014-06-22 (×2): qty 2

## 2014-06-22 MED ORDER — CHLORHEXIDINE GLUCONATE 0.12 % MT SOLN
15.0000 mL | Freq: Two times a day (BID) | OROMUCOSAL | Status: DC
  Administered 2014-06-22 – 2014-06-25 (×7): 15 mL via OROMUCOSAL
  Filled 2014-06-22 (×7): qty 15

## 2014-06-22 MED ORDER — FENTANYL CITRATE 0.05 MG/ML IJ SOLN
50.0000 ug | Freq: Once | INTRAMUSCULAR | Status: AC
  Administered 2014-06-22: 50 ug via INTRAVENOUS

## 2014-06-22 MED ORDER — ETOMIDATE 2 MG/ML IV SOLN
20.0000 mg | Freq: Once | INTRAVENOUS | Status: AC
  Administered 2014-06-22: 20 mg via INTRAVENOUS

## 2014-06-22 MED ORDER — SODIUM CHLORIDE 0.9 % IV SOLN: INTRAVENOUS | Status: DC

## 2014-06-22 MED ORDER — CETYLPYRIDINIUM CHLORIDE 0.05 % MT LIQD
7.0000 mL | Freq: Four times a day (QID) | OROMUCOSAL | Status: DC
  Administered 2014-06-23 – 2014-06-26 (×13): 7 mL via OROMUCOSAL

## 2014-06-22 MED ORDER — METOPROLOL TARTRATE 25 MG PO TABS
25.0000 mg | ORAL_TABLET | Freq: Two times a day (BID) | ORAL | Status: DC
  Administered 2014-06-22 – 2014-06-24 (×4): 25 mg
  Filled 2014-06-22 (×4): qty 1

## 2014-06-22 MED ORDER — LEVOTHYROXINE SODIUM 50 MCG PO TABS
50.0000 ug | ORAL_TABLET | Freq: Every day | ORAL | Status: DC
  Administered 2014-06-23 – 2014-06-24 (×2): 50 ug
  Filled 2014-06-22 (×3): qty 1

## 2014-06-22 MED ORDER — FENTANYL CITRATE 0.05 MG/ML IJ SOLN
INTRAMUSCULAR | Status: AC
  Filled 2014-06-22: qty 2

## 2014-06-22 MED ORDER — ROCURONIUM BROMIDE 50 MG/5ML IV SOLN
INTRAVENOUS | Status: AC
  Filled 2014-06-22: qty 2

## 2014-06-22 MED ORDER — IPRATROPIUM-ALBUTEROL 0.5-2.5 (3) MG/3ML IN SOLN
3.0000 mL | Freq: Four times a day (QID) | RESPIRATORY_TRACT | Status: DC
  Administered 2014-06-22 – 2014-06-27 (×17): 3 mL via RESPIRATORY_TRACT
  Filled 2014-06-22 (×19): qty 3

## 2014-06-22 MED ORDER — ALBUTEROL SULFATE (2.5 MG/3ML) 0.083% IN NEBU: 2.5000 mg | INHALATION_SOLUTION | RESPIRATORY_TRACT | Status: DC | PRN

## 2014-06-22 MED ORDER — PANTOPRAZOLE SODIUM 40 MG IV SOLR
40.0000 mg | Freq: Every day | INTRAVENOUS | Status: DC
Start: 2014-06-22 — End: 2014-06-23
  Administered 2014-06-22 – 2014-06-23 (×2): 40 mg via INTRAVENOUS
  Filled 2014-06-22: qty 40

## 2014-06-22 MED ORDER — POTASSIUM CHLORIDE 20 MEQ/15ML (10%) PO LIQD
20.0000 meq | Freq: Every day | ORAL | Status: DC
  Administered 2014-06-22: 20 meq
  Filled 2014-06-22 (×2): qty 15

## 2014-06-22 MED ORDER — ROCURONIUM BROMIDE 50 MG/5ML IV SOLN
50.0000 mg | Freq: Once | INTRAVENOUS | Status: AC
  Administered 2014-06-22: 50 mg via INTRAVENOUS

## 2014-06-22 NOTE — Progress Notes (Signed)
RT changed CVP setup per protocol with no complications.

## 2014-06-22 NOTE — Progress Notes (Signed)
[facility] PROGRESS NOTE  Assessment/Plan: Acute on chronic diastolic CHF exacerbation  -I/O cont to be negative; and finally with good urine output -Unknown dry weight. Continue to have massive fluid overload on exam -will follow rec's from HF team, for now continue on milrinone and lasix GGT. -RHC today (10/12) or Tomorrow (10/13)  -since Cr started to worsening again, BUN is 101, CO2 43 and urine output has become sluggish renal servcie will be consulted for evaluation and initiation of HD if needed.   Candida intertrigo: -continue nystatin powder.  Acute on chronic kidney disease stage III: - Baseline Cr 1.6, increase in Cr most likely due to HF and poor perfusion. - Chronic component, Secondary to uncontrol diabetes and hypertension.  - Acute component due to vol overload. Renal U/S no good windows; but no frank obstruction appreciated -Cr 2.93 and BUN 101 (10/12); and with sluggish urine output despite aggressive IV diuretics and milrinone use -plan is to involve renal service as she might need to be started on HD -urine output improving and good diuresis present   Patient CO2 elevated: due to contraction alkalosis -will d/c diamox as per HF team rec's -follow electrolytes -patient with good O2 sat on current oxygen supplementation -CO2 down to 43  New onset hypothyroidism: - TSH on admission was 18.36. No prior history of hypothyroidism.  - continue synthroid. - will need Repeat TSH in 6 weeks.  Hyponatremia  -Likely secondary to a hypervolemic hyponatremia and hyperglycemia -Resolved with diuresis and insulin treatment. -will monitor; last Sodium 140 (10/12)  Chronic respiratory failure/COPD  -breathing improving -will need Sleep study as an outpatient; high probability for OSA.  Diabetes mellitus with mild hypoglycemia -Cont Lantus at current dose (30 units BID) and SSI sensitive. -no SSI bedtime coverage  -no further hypoglycemic event; highest CBG <  200 range   Physical deconditioning -will need SNF at discharge -continue PT  Prophylaxis  -continue Heparin for DVT prophylaxis.   Anxiety: slightly anxious and sad. -will start patient on buspar  Code Status: Full  Family Communication: no family at bedside Disposition Plan: SNF when medically stable.   Consultants:  none  Procedures:  Renal U/s - 2-D echo has been obtained that shows grade 2 diastolic dysfunction with EF of 55-60% with no wall motion abnormalities.   Antibiotics:  None  HPI/Subjective: Alert, awake, oriented x 3, afebrile; denies CP and reports breathing is stable. She is crying and sad, since was informed might need HD. No further hypoglycemic events appreciated. Patient still with extensive signs of fluid overload. Urine output has been sluggish (in the last 24-36 hours) despite elevated BNP.  Objective: Filed Vitals:   06/22/14 0500 06/22/14 0600 06/22/14 0700 06/22/14 0800  BP: 106/20 116/29 87/25 103/29  Pulse: 88 88 87 87  Temp:   97.3 F (36.3 C)   TempSrc:   Oral   Resp: 18 20 17 17   Height:      Weight:      SpO2: 93% 97% 99% 95%    Intake/Output Summary (Last 24 hours) at 06/22/14 1045 Last data filed at 06/22/14 0837  Gross per 24 hour  Intake  922.6 ml  Output   1945 ml  Net -1022.4 ml   Filed Weights   06/20/14 0410 06/21/14 0500 06/22/14 0403  Weight: 132 kg (291 lb 0.1 oz) 129 kg (284 lb 6.3 oz) 128 kg (282 lb 3 oz)    Exam:  General: Alert, awake, oriented x 3, afebrile; denies CP  and reports breathing is stable. She is crying and sad, since was informed might need HD. No further hypoglycemic events appreciated. Patient still with extensive signs of fluid overload. Urine output has been sluggish despite elevated BNP. HEENT: No bruits, no goiter. Positive JVD Heart: Regular rate, no rubs or gallops; 2-3++ edema bilaterally Lungs: decrease BS at bases, no wheezing Abdomen: Soft, nontender, positive BS; significant  edema around abdomen and sacrum  Data Reviewed: Basic Metabolic Panel:  Recent Labs Lab 06/18/14 0440 06/19/14 0235 06/20/14 0433 06/21/14 0500 06/22/14 0604  NA 139 142 139 137 140  K 3.3* 3.5* 3.7 3.6* 3.8  CL 90* 89* 89* 85* 86*  CO2 39* 39* 42* 41* 43*  GLUCOSE 56* 39* 108* 163* 176*  BUN 105* 103* 102* 100* 101*  CREATININE 3.09* 2.97* 2.87* 2.87* 2.93*  CALCIUM 9.1 9.2 9.0 9.0 9.3   CBC:  Recent Labs Lab 06/18/14 0440 06/19/14 0235 06/20/14 0433 06/21/14 0500 06/22/14 0604  WBC 9.6 12.2* 8.3 8.5 9.1  NEUTROABS 7.7 9.8* 6.7 6.7 7.6  HGB 9.0* 9.0* 8.3* 8.5* 8.2*  HCT 28.6* 27.9* 26.5* 26.9* 26.4*  MCV 92.3 91.5 93.0 95.1 96.4  PLT 168 196 176 177 185   BNP (last 3 results)  Recent Labs  09/10/13 1827 10/13/13 1916 06/09/14 1818  PROBNP 4050.0* 1519.0* 4358.0*   CBG:  Recent Labs Lab 06/20/14 2157 06/21/14 0740 06/21/14 1247 06/21/14 1721 06/21/14 2216  GLUCAP 171* 172* 218* 191* 194*    Recent Results (from the past 240 hour(s))  MRSA PCR SCREENING     Status: Abnormal   Collection Time    06/15/14  2:30 PM      Result Value Ref Range Status   MRSA by PCR POSITIVE (*) NEGATIVE Final   Comment:            The GeneXpert MRSA Assay (FDA     approved for NASAL specimens     only), is one component of a     comprehensive MRSA colonization     surveillance program. It is not     intended to diagnose MRSA     infection nor to guide or     monitor treatment for     MRSA infections.     RESULT CALLED TO, READ BACK BY AND VERIFIED WITH:     Harriett RushJ. COVINGTON RN 16:10 06/15/14 ([email or web address])     Studies: No results found.  Scheduled Meds: . antiseptic oral rinse  7 mL Mouth Rinse BID  . Brinzolamide-Brimonidine  1 drop Both Eyes BID  . heparin  5,000 Units Subcutaneous 3 times per day  . insulin aspart  0-9 Units Subcutaneous TID WC  . insulin aspart  4 Units Subcutaneous TID WC  . insulin glargine  30 Units Subcutaneous BID  . levothyroxine  50  mcg Oral QAC breakfast  . metolazone  5 mg Oral BID  . metoprolol tartrate  25 mg Oral BID  . nystatin   Topical TID  . potassium chloride  20 mEq Oral Daily  . pregabalin  75 mg Oral QPM  . sodium chloride  10-40 mL Intracatheter Q12H  . sodium chloride  10-40 mL Intracatheter Q12H  . tiotropium  18 mcg Inhalation Daily   Continuous Infusions: . furosemide (LASIX) infusion 15 mg/hr (06/22/14 0700)  . milrinone 0.25 mcg/kg/min (06/22/14 16100819)   Time: < 30 minutes  Vassie LollMadera, Larance Ratledge  [facility] Pager [phone]. If 8PM-8AM, please contact night-coverage at [email or web address], password [email or web address] 06/22/2014, 10:45  AM  LOS: 13 days

## 2014-06-22 NOTE — Consult Note (Signed)
PULMONARY / CRITICAL CARE MEDICINE   Name: Kimberly [name] MRN: [id number] DOB: 06-Jul-1956    ADMISSION DATE:  06/09/2014 CONSULTATION DATE:  06/22/2014  REFERRING MD :  Gwenlyn Perking  CHIEF COMPLAINT:  Acute on chronic hypercarbic respiratory failuer  INITIAL PRESENTATION: 58 y.o. F admitted 9/29 with anasarca, acute exacerbation of diastolic heart failure, AKI.  Since admission, has required lasix gtt and milrinone per recommendations of HF team - continues to have sluggish diuresis despite multiple meds.  On afternoon of 10/12, pt became more lethargic.  ABG demonstrated hypercarbic respiratory failure.  Pt placed on BiPAP but did not have any improvement.  PCCM consulted for possible need of intubation.   STUDIES:  Echo 9/30 >>> EF 55-60%, Grade 2 diastolic dysfunction, MV mod stenosis, PA peak 77. Renal US 9/29 >>> kidneys and bladder not visualized secondary to body habitus. CT abd/pelvis 9/20 >>> limited study due to body habitus, extensive edema throughout subcutaneous fat of abdomen, small right pleural effusion, mod abdominal pelvic ascites.  SIGNIFICANT EVENTS: 9/29 - admit 9/30 - renal consulted 10/3 - renal signoff 10/05 - HF team consulted, CVL placed for CVP's and Co-Ox's. 10/06 - lasix gtt, milrinone started 10/10 - renal re-consulted for sluggish diuresis, AKI.    HISTORY OF PRESENT ILLNESS:  Pt is encephalopathic; therefore, this HPI is obtained from chart review. Kimberly [name] is a 58 y.o. F admitted 9/29 with anasarca, acute on chronic diastolic heart failure, AKI.  Pt had reportedly gained 45lbs during month prior to admission despite increased diuretic use. During hospitalization, she was evaluated by heart failure team and placed on lasix gtt, diamox, metolazone, and milrinone.  Despite this combo of meds, pt had sluggish diuresis. On 10/12, pt was more lethargic than usual. ABG obtained and demonstrated hypercarbic respiratory failure.  She was placed on BiPAP but  unfortunately had minimal response. PCCM was consulted for intubation.   PAST MEDICAL HISTORY :  Past Medical History  Diagnosis Date  . HYPERLIPIDEMIA   . HYPERTENSION   . CAD, NATIVE VESSEL     May 10, 2010 cath showed a hyperdynamic LV function, she had dominant circumflex anatomy with a 70-80% small OM1. She had diffuse diabetic plaque particularly in the distal LAD. She nondominant RCA.  Nondominant  . PVD     CEA  . DM   . COPD   . Edema   . Stroke   . CHF (congestive heart failure)     Preserved EF  . Complication of anesthesia     DIFFICULT WAKING   . Cellulitis 10/15/2013    BILATERAL  . Chronic kidney disease   . Diabetic neuropathy   . Anasarca 05/2014   Past Surgical History  Procedure Laterality Date  . Eye surgery    . Carotid endarterectomy     Prior to Admission medications   Medication Sig Start Date End Date Taking? Authorizing Provider  acetaminophen (TYLENOL) 325 MG tablet Take 650 mg by mouth every 6 (six) hours as needed for mild pain.   Yes Historical Provider, MD  albuterol (PROVENTIL HFA;VENTOLIN HFA) 108 (90 BASE) MCG/ACT inhaler Inhale 1-2 puffs into the lungs every 6 (six) hours as needed for wheezing or shortness of breath.   Yes Historical Provider, MD  aspirin EC 81 MG tablet Take 81 mg by mouth daily.   Yes Historical Provider, MD  cloNIDine (CATAPRES) 0.1 MG tablet Take 0.1 mg by mouth 2 (two) times daily.   Yes Historical Provider, MD  FIBER PO Take  1 tablet by mouth daily.   Yes Historical Provider, MD  furosemide (LASIX) 40 MG tablet Take 40 mg by mouth daily.    Yes Historical Provider, MD  insulin aspart (NOVOLOG) 100 UNIT/ML injection Inject 20-60 Units into the skin 3 (three) times daily before meals. DEPENDS ON WHAT SHE IS GOING TO EAT   Yes Historical Provider, MD  insulin glargine (LANTUS) 100 UNIT/ML injection Inject 55 Units into the skin 2 (two) times daily.   Yes Historical Provider, MD  metoprolol tartrate (LOPRESSOR) 25 MG  tablet Take 25 mg by mouth 2 (two) times daily.   Yes Historical Provider, MD  Multiple Vitamins-Minerals (MULTIVITAMIN PO) Take 1 tablet by mouth daily.   Yes Historical Provider, MD  pregabalin (LYRICA) 75 MG capsule Take 75 mg by mouth every evening.    Yes Historical Provider, MD   Allergies  Allergen Reactions  . Epinephrine     Increased heart rate    FAMILY HISTORY:  Family History  Problem Relation Age of Onset  . Cancer Mother     Breast, NHL  . Stroke Mother   . Peripheral vascular disease Father   . CAD Father 1130   SOCIAL HISTORY:  reports that she quit smoking about 5 years ago. She has never used smokeless tobacco. She reports that she does not drink alcohol or use illicit drugs.  REVIEW OF SYSTEMS:  Unable to obtain as pt is encephalopathic.  SUBJECTIVE:   VITAL SIGNS: Temp:  [97.3 F (36.3 C)-98.3 F (36.8 C)] 97.3 F (36.3 C) (10/12 1624) Pulse Rate:  [86-96] 94 (10/12 1643) Resp:  [13-22] 15 (10/12 1643) BP: (81-147)/(20-42) 147/41 mmHg (10/12 1627) SpO2:  [90 %-100 %] 100 % (10/12 1643) Weight:  [128 kg (282 lb 3 oz)] 128 kg (282 lb 3 oz) (10/12 0403) HEMODYNAMICS: CVP:  [15 mmHg-16 mmHg] 16 mmHg VENTILATOR SETTINGS:   INTAKE / OUTPUT: Intake/Output     10/11 0701 - 10/12 0700 10/12 0701 - 10/13 0700   P.O. 475 120   I.V. (mL/kg) 619.2 (4.8) 194.7 (1.5)   IV Piggyback  66   Total Intake(mL/kg) 1094.2 (8.5) 380.7 (3)   Urine (mL/kg/hr) 2265 (0.7) 1000 (0.8)   Total Output 2265 1000   Net -1170.8 -619.3          PHYSICAL EXAMINATION: General: Morbidly obese female, unresponsive on BiPAP. Neuro: Does not open eyes or answer questions, no response to sternal rub. HEENT: [place]/AT. PERRL, sclerae anicteric. Cardiovascular: RRR, no M/R/G.  Lungs: Respirations shallow.  No W/R/R appreciated. Abdomen: BS x 4, soft, NT/ND.  Musculoskeletal: No gross deformities, Anasarca. Skin: Intact, warm, no rashes.  LABS:  CBC  Recent Labs Lab  06/20/14 0433 06/21/14 0500 06/22/14 0604  WBC 8.3 8.5 9.1  HGB 8.3* 8.5* 8.2*  HCT 26.5* 26.9* 26.4*  PLT 176 177 185   Coag's No results found for this basename: APTT, INR,  in the last 168 hours BMET  Recent Labs Lab 06/20/14 0433 06/21/14 0500 06/22/14 0604  NA 139 137 140  K 3.7 3.6* 3.8  CL 89* 85* 86*  CO2 42* 41* 43*  BUN 102* 100* 101*  CREATININE 2.87* 2.87* 2.93*  GLUCOSE 108* 163* 176*   Electrolytes  Recent Labs Lab 06/20/14 0433 06/21/14 0500 06/22/14 0604  CALCIUM 9.0 9.0 9.3   Sepsis Markers No results found for this basename: LATICACIDVEN, PROCALCITON, O2SATVEN,  in the last 168 hours ABG  Recent Labs Lab 06/22/14 1347 06/22/14 1638  PHART 7.441  7.341*  PCO2ART 67.9* 89.0*  PO2ART 111.0* 99.0   Liver Enzymes No results found for this basename: AST, ALT, ALKPHOS, BILITOT, ALBUMIN,  in the last 168 hours Cardiac Enzymes No results found for this basename: TROPONINI, PROBNP,  in the last 168 hours Glucose  Recent Labs Lab 06/21/14 1247 06/21/14 1721 06/21/14 2216 06/22/14 0742 06/22/14 1129 06/22/14 1621  GLUCAP 218* 191* 194* 181* 183* 217*    Imaging No results found.   ASSESSMENT / PLAN:  PULMONARY OETT 10/12 >>> A: Acute on chronic hypercarbic respiratory failure COPD without evidence of exacerbation- no PFT's found in system. Probable OSA / OHS PAH - due to above.  Echo from 9/39 revealed peak PA pressures of 77. Possible aspiration - moderate secretions noted during intubation 10/12 P:   Full mechanical support, wean as able. VAP bundle. SBT in AM. D/c Spiriva. DuoNebs / Albuterol. Consider sleep study as outpatient as well as further workup / management of PAH, COPD. ABG and CXR in AM.  CARDIOVASCULAR CVL L IJ 10/7 >>> A:  Acute on chronic dCHF - CVP's in 20's. Probable cardiorenal syndrome Transient hypotension - post sedation / intubation. Hx HTN, HLD, CAD, PVD P:  HF team following. Aggressive  diuresis as able per HF team recs. CVP's / Co-ox's. RHC planned (10/13 ?) Strict I/O's.  RENAL A:   Probable cardiorenal syndrome AKI P:   Nephrology following. KVO fluids. Continue lasix gtt, metolazone, milrinone, metoprolol. Change PO potassium to per tube. Poor HD candidate per nephrology. BMP in AM.  GASTROINTESTINAL A:   GI prophylaxis Nutrition P:   SUP: Pantoprazole. NPO. TF if remains NPO > 24 hours.  HEMATOLOGIC A:   Anemia - chronic. VTE Prophylaxis P:  Transfuse per usual ICU guidelines. SCD's / Heparin. CBC in AM.  INFECTIOUS A:  Possible aspiration - moderate secretions noted during intubation.  No evidence of obvious infiltrate on CXR at this point. P:   Sputum Cx 10/12 >>> Monitor clinically.  ENDOCRINE A:   DM Hypothyroidism  P:   D/c lantus (had some issues with mild hypoglycemia). SSI q4hrs. Continue synthroid, change from PO to per tube.  NEUROLOGIC A:   Acute metabolic encephalopathy Anxiety Hx Diabetic Neuropathy P:   Sedation:  Fentanyl PRN. RASS goal: 0 to -1. Daily WUA. Holding buspar, robaxin, morphine, oxycodone, lyrica.  TODAY'S SUMMARY: 58 y.o. F admitted for AoC diastolic heart failure.  Despite BiPAP, she had progressive lethargy PM of 10/12 to the point that she required intubation.  Pain and sedating meds held for now, can resume once extubated.   Rutherford Guysahul Desai, PA - [facility] [place] [facility] Pgr: [phone]  or [phone] 06/22/2014, 5:13 PM  I have personally obtained a history, examined the patient, evaluated laboratory and imaging results, formulated the assessment and plan and placed orders. CRITICAL CARE: The patient is critically ill with multiple organ systems failure and requires high complexity decision making for assessment and support, frequent evaluation and titration of therapies, application of advanced monitoring technologies and extensive interpretation of multiple  databases. Critical Care Time devoted to patient care services described in this note is 40 minutes.   Billy Fischeravid Domnick Chervenak, MD ; [facility] service Mobile [phone].  After 5:30 PM or weekends, call [phone] Pulmonary and Critical Care Medicine [facility] Pager: [phone]

## 2014-06-22 NOTE — Procedures (Signed)
Oral Intubation Procedure Note   Procedure: Intubation Indications: Respiratory insufficiency Consent: Unable to obtain consent because of altered level of consciousness. Time Out: Verified patient identification, verified procedure, site/side was marked, verified correct patient position, special equipment/implants available, medications/allergies/relevent history reviewed, required imaging and test results available.   Pre-meds: Etomidate 20 mg IV  Neuromuscular blockade: Rocuronium 50 mg IV  Laryngoscope: #3 MAC > partial view of posterior cords Glidescope > full visualization  Visualization: cords fully visualized  ETT: 7.5 ETT passed on first attempt and secured @ 23 cm at upper incisors  Findings: Difficult airway. Easily visualized with glidescope   Evaluation:  CXR revealed proper position of ETT  Procedure was complicated by desaturation and bradycardia. One mg atropine given   Billy Fischeravid Simonds, MD ; [facility] service Mobile [phone].  After 5:30 PM or weekends, call [phone]

## 2014-06-22 NOTE — Progress Notes (Signed)
     Recent Labs Lab 06/20/14 0500 06/21/14 0512 06/22/14 0608 06/22/14 1347 06/22/14 1638  PHART  --   --   --  7.441 7.341*  PCO2ART  --   --   --  67.9* 89.0*  PO2ART  --   --   --  111.0* 99.0  HCO3  --   --   --  46.2* 48.1*  TCO2  --   --   --  48 >50  O2SAT 96.6 77.5 75.1 98.0 97.0   Rn calling elink  ABG worse after bipap  Camera exam  - she is obtunded RASS -4 equivalent  - shallow respiration   A Acute on chronic hypercapnic respiratory failure  PLAN PCCM bedside consult Likely intubation Move to acute side  Dr. Kalman ShanMurali Sharece Fleischhacker, M.D., [facility] Pulmonary and Critical Care Medicine Staff Physician [facility] [place] [facility] Pager: [phone], If no answer or between  15:00h - 7:00h: call [phone]  06/22/2014 4:56 PM

## 2014-06-22 NOTE — Progress Notes (Addendum)
RT informed Rn of Panic values on ABG and called [place] and informed MD

## 2014-06-22 NOTE — Progress Notes (Signed)
   Patient on vent  Change cbg from ssi to Phase 1 hyperglycemia  Dr. Kalman ShanMurali Saleah Rishel, M.D.,  [facility].P Pulmonary and Critical Care Medicine Staff Physician [facility] [place] [facility] Pager: [phone], If no answer or between  15:00h - 7:00h: call [phone]  06/22/2014 10:28 PM

## 2014-06-22 NOTE — Consult Note (Signed)
Reason for Consult:AKI/CKD in setting of decompensated diastolic CHF and possible cardiorenal syndrome Referring Physician: Gala Romney, MD  Kimberly [name] is an 58 y.o. female.  HPI: Pt is a 58yo WF with multiple medical problems most notable for morbid obesity, DM, HTN, CAD, PVD s/p CEA, h/o CVA, COPD, and diastolic CHF who was admitted 1/61/09 with worsening anasarca and CHF (pt reported 45lb weight gain over the month preceding admission).  She has diuresed 22kg since admission and has had AKI/CKD during this hospitalization and we were asked to help evaluate and manage her azotemia/AKI likely related to cardiorenal syndrome. She is currently very lethargic and difficult to arouse.  According to the progress notes, she has been essentially bed-bound and totally dependent on assistance for care.  Her UOP has ranged from 1-3 liters/day and is -22kg to date on a lasix and milrinone gtt.  Her cvp was 20 and still has anasarca on exam.  Her trend in Scr is seen below:   Trend in Creatinine: Creatinine, Ser  Date/Time Value Ref Range Status  06/22/2014  6:04 AM 2.93* 0.50 - 1.10 mg/dL Final  60/45/4098  1:19 AM 2.87* 0.50 - 1.10 mg/dL Final  14/78/2956  2:13 AM 2.87* 0.50 - 1.10 mg/dL Final  04/17/5783  6:96 AM 2.97* 0.50 - 1.10 mg/dL Final  29/01/2840  3:24 AM 3.09* 0.50 - 1.10 mg/dL Final  40/09/270  5:36 PM 3.22* 0.50 - 1.10 mg/dL Final  64/12/345  4:25 AM 3.25* 0.50 - 1.10 mg/dL Final  95/02/3874 64:33 PM 3.20* 0.50 - 1.10 mg/dL Final  29/01/1883  1:66 AM 3.08* 0.50 - 1.10 mg/dL Final  02/12/159  1:09 AM 2.55* 0.50 - 1.10 mg/dL Final  32/11/5571  2:20 AM 1.95* 0.50 - 1.10 mg/dL Final  25/12/2704  2:37 AM 2.00* 0.50 - 1.10 mg/dL Final  62/04/3150  7:61 AM 2.06* 0.50 - 1.10 mg/dL Final  02/15/3709  6:26 AM 2.19* 0.50 - 1.10 mg/dL Final  9/48/5462  7:03 PM 2.27* 0.50 - 1.10 mg/dL Final  5/0/0938  1:82 AM 1.89* 0.50 - 1.10 mg/dL Final  05/21/3715  9:67 AM 1.81* 0.50 - 1.10 mg/dL Final  04/19/3809  1:75 PM  1.77* 0.50 - 1.10 mg/dL Final  1/0/2585  2:77 AM 1.73* 0.50 - 1.10 mg/dL Final  04/12/4234  3:61 AM 1.78* 0.50 - 1.10 mg/dL Final  12/13/3152  0:08 AM 2.28* 0.50 - 1.10 mg/dL Final  02/15/6194  0:93 AM 1.90* 0.50 - 1.10 mg/dL Final  26/71/2458  0:99 PM 1.74* 0.50 - 1.10 mg/dL Final  04/13/3824  0:53 AM 1.40* 0.50 - 1.10 mg/dL Final  05/18/6733  1:93 AM 1.32* 0.50 - 1.10 mg/dL Final  7/90/2409  7:35 PM 1.3* 0.4 - 1.2 mg/dL Final  11/11/9922  2:68 AM 1.24* 0.4 - 1.2 mg/dL Final  11/12/1960  2:29 AM 1.18  0.4 - 1.2 mg/dL Final  03/19/8920  1:94 AM 1.22* 0.4 - 1.2 mg/dL Final  1/74/0814  4:81 AM 1.12  0.4 - 1.2 mg/dL Final  8/56/3149  7:02 AM 1.10  0.4 - 1.2 mg/dL Final  6/37/8588  5:02 AM 1.05  0.4 - 1.2 mg/dL Final  7/74/1287  8:67 AM 1.14  0.4 - 1.2 mg/dL Final  6/72/0947  0:96 AM 1.16  0.4 - 1.2 mg/dL Final  2/83/6629  4:76 AM 1.19  0.4 - 1.2 mg/dL Final  5/46/5035  4:65 PM 1.36* 0.4 - 1.2 mg/dL Final  6/81/2751  7:00 PM 1.24* 0.4 - 1.2 mg/dL Final  09/17/4942  9:67 AM  1.0  0.4 - 1.2 mg/dL Final    PMH:   Past Medical History  Diagnosis Date  . HYPERLIPIDEMIA   . HYPERTENSION   . CAD, NATIVE VESSEL     May 10, 2010 cath showed a hyperdynamic LV function, she had dominant circumflex anatomy with a 70-80% small OM1. She had diffuse diabetic plaque particularly in the distal LAD. She nondominant RCA.  Nondominant  . PVD     CEA  . DM   . COPD   . Edema   . Stroke   . CHF (congestive heart failure)     Preserved EF  . Complication of anesthesia     DIFFICULT WAKING   . Cellulitis 10/15/2013    BILATERAL  . Chronic kidney disease   . Diabetic neuropathy   . Anasarca 05/2014    PSH:   Past Surgical History  Procedure Laterality Date  . Eye surgery    . Carotid endarterectomy      Allergies:  Allergies  Allergen Reactions  . Epinephrine     Increased heart rate    Medications:   Prior to Admission medications   Medication Sig Start Date End Date Taking? Authorizing Provider   acetaminophen (TYLENOL) 325 MG tablet Take 650 mg by mouth every 6 (six) hours as needed for mild pain.   Yes Historical Provider, MD  albuterol (PROVENTIL HFA;VENTOLIN HFA) 108 (90 BASE) MCG/ACT inhaler Inhale 1-2 puffs into the lungs every 6 (six) hours as needed for wheezing or shortness of breath.   Yes Historical Provider, MD  aspirin EC 81 MG tablet Take 81 mg by mouth daily.   Yes Historical Provider, MD  cloNIDine (CATAPRES) 0.1 MG tablet Take 0.1 mg by mouth 2 (two) times daily.   Yes Historical Provider, MD  FIBER PO Take 1 tablet by mouth daily.   Yes Historical Provider, MD  furosemide (LASIX) 40 MG tablet Take 40 mg by mouth daily.    Yes Historical Provider, MD  insulin aspart (NOVOLOG) 100 UNIT/ML injection Inject 20-60 Units into the skin 3 (three) times daily before meals. DEPENDS ON WHAT SHE IS GOING TO EAT   Yes Historical Provider, MD  insulin glargine (LANTUS) 100 UNIT/ML injection Inject 55 Units into the skin 2 (two) times daily.   Yes Historical Provider, MD  metoprolol tartrate (LOPRESSOR) 25 MG tablet Take 25 mg by mouth 2 (two) times daily.   Yes Historical Provider, MD  Multiple Vitamins-Minerals (MULTIVITAMIN PO) Take 1 tablet by mouth daily.   Yes Historical Provider, MD  pregabalin (LYRICA) 75 MG capsule Take 75 mg by mouth every evening.    Yes Historical Provider, MD    Inpatient medications: . antiseptic oral rinse  7 mL Mouth Rinse BID  . Brinzolamide-Brimonidine  1 drop Both Eyes BID  . busPIRone  5 mg Oral TID  . heparin  5,000 Units Subcutaneous 3 times per day  . insulin aspart  0-9 Units Subcutaneous TID WC  . insulin aspart  4 Units Subcutaneous TID WC  . insulin glargine  30 Units Subcutaneous BID  . levothyroxine  50 mcg Oral QAC breakfast  . metolazone  5 mg Oral BID  . metoprolol tartrate  25 mg Oral BID  . nystatin   Topical TID  . potassium chloride  20 mEq Oral Daily  . pregabalin  75 mg Oral QPM  . sodium chloride  10-40 mL Intracatheter  Q12H  . sodium chloride  10-40 mL Intracatheter Q12H  . tiotropium  18 mcg Inhalation Daily    Discontinued Meds:   Medications Discontinued During This Encounter  Medication Reason  . levofloxacin (LEVAQUIN) 250 MG tablet Completed Course  . insulin glargine (LANTUS) 100 UNIT/ML injection Inpatient Standard  . collagenase (SANTYL) ointment Inpatient Standard  . doxycycline (VIBRAMYCIN) 100 MG capsule Completed Course  . traMADol (ULTRAM) 50 MG tablet Patient has not taken in last 30 days  . albuterol (PROVENTIL) (2.5 MG/3ML) 0.083% nebulizer solution Patient has not taken in last 30 days  . furosemide (LASIX) 160 mg in dextrose 5 % 50 mL IVPB   . furosemide (LASIX) injection 80 mg   . insulin glargine (LANTUS) injection 55 Units   . insulin aspart (novoLOG) injection 0-9 Units   . insulin aspart (novoLOG) injection 0-9 Units   . sodium chloride 0.9 % injection 3 mL   . sodium chloride 0.9 % injection 3 mL   . 0.9 %  sodium chloride infusion   . methylPREDNISolone sodium succinate (SOLU-MEDROL) 125 mg/2 mL injection 80 mg   . insulin glargine (LANTUS) injection 5 Units   . cyclobenzaprine (FLEXERIL) tablet 5 mg   . insulin aspart (novoLOG) injection 0-9 Units   . insulin aspart (novoLOG) injection 0-5 Units   . cloNIDine (CATAPRES) tablet 0.1 mg   . insulin glargine (LANTUS) injection 55 Units   . furosemide (LASIX) injection 80 mg   . metolazone (ZAROXOLYN) tablet 2.5 mg   . insulin aspart (novoLOG) injection 0-15 Units   . insulin aspart (novoLOG) injection 4 Units   . levofloxacin (LEVAQUIN) IVPB 750 mg   . insulin glargine (LANTUS) injection 70 Units   . levothyroxine (SYNTHROID, LEVOTHROID) tablet 25 mcg   . Chlorhexidine Gluconate Cloth 2 % PADS 6 each   . insulin aspart (novoLOG) injection 0-5 Units   . insulin aspart (novoLOG) injection 0-15 Units   . insulin glargine (LANTUS) injection 60 Units   . insulin glargine (LANTUS) injection 55 Units   . DOPamine (INTROPIN)  800 mg in dextrose 5 % 250 mL (3.2 mg/mL) infusion   . metolazone (ZAROXOLYN) tablet 2.5 mg   . morphine 2 MG/ML injection 1-4 mg   . sodium chloride 0.9 % injection 10-40 mL   . sodium chloride 0.9 % injection 10-40 mL   . acetaZOLAMIDE (DIAMOX) 12 hr capsule 500 mg     Social History:  reports that she quit smoking about 5 years ago. She has never used smokeless tobacco. She reports that she does not drink alcohol or use illicit drugs.  Family History:   Family History  Problem Relation Age of Onset  . Cancer Mother     Breast, NHL  . Stroke Mother   . Peripheral vascular disease Father   . CAD Father 30    Review of systems not obtained due to patient factors. Weight change: -1 kg (-2 lb 3.3 oz)  Intake/Output Summary (Last 24 hours) at 06/22/14 1218 Last data filed at 06/22/14 1100  Gross per 24 hour  Intake  948.4 ml  Output   1950 ml  Net -1001.6 ml   BP 116/31  Pulse 89  Temp(Src) 97.4 F (36.3 C) (Oral)  Resp 16  Ht 5\' 4"  (1.626 m)  Wt 128 kg (282 lb 3 oz)  BMI 48.41 kg/m2  SpO2 100% Filed Vitals:   06/22/14 0700 06/22/14 0800 06/22/14 0900 06/22/14 1112  BP: 87/25 103/29 81/35 116/31  Pulse: 87 87 89 89  Temp: 97.3 F (36.3 C)   97.4 F (  36.3 C)  TempSrc: Oral   Oral  Resp: 17 17 15 16   Height:      Weight:      SpO2: 99% 95% 93% 100%     General appearance: morbidly obese, uncooperative and lethargic and unresponsive  Head: Normocephalic, without obvious abnormality, atraumatic Eyes: negative findings: lids and lashes normal, conjunctivae and sclerae normal and corneas clear Neck: no adenopathy, no carotid bruit, supple, symmetrical, trachea midline and thyroid not enlarged, symmetric, no tenderness/mass/nodules Resp: diminished breath sounds bilaterally Cardio: regular rate and rhythm, no rub and faint HS GI: obese, +BS, +anasarca (edemetous panus) Extremities: edema brawny edema bilaterally Neurologic: Mental status: pt is somnolent and unable  to stay awake long enough to answer questions or follow commands  Labs: Basic Metabolic Panel:  Recent Labs Lab 06/16/14 0450 06/17/14 1350 06/18/14 0440 06/19/14 0235 06/20/14 0433 06/21/14 0500 06/22/14 0604  NA 137 137 139 142 139 137 140  K 3.9 3.7 3.3* 3.5* 3.7 3.6* 3.8  CL 90* 90* 90* 89* 89* 85* 86*  CO2 34* 37* 39* 39* 42* 41* 43*  GLUCOSE 207* 59* 56* 39* 108* 163* 176*  BUN 101* 105* 105* 103* 102* 100* 101*  CREATININE 3.25* 3.22* 3.09* 2.97* 2.87* 2.87* 2.93*  CALCIUM 8.3* 8.9 9.1 9.2 9.0 9.0 9.3   Liver Function Tests: No results found for this basename: AST, ALT, ALKPHOS, BILITOT, PROT, ALBUMIN,  in the last 168 hours No results found for this basename: LIPASE, AMYLASE,  in the last 168 hours No results found for this basename: AMMONIA,  in the last 168 hours CBC:  Recent Labs Lab 06/19/14 0235 06/20/14 0433 06/21/14 0500 06/22/14 0604  WBC 12.2* 8.3 8.5 9.1  NEUTROABS 9.8* 6.7 6.7 7.6  HGB 9.0* 8.3* 8.5* 8.2*  HCT 27.9* 26.5* 26.9* 26.4*  MCV 91.5 93.0 95.1 96.4  PLT 196 176 177 185   PT/INR: @LABRCNTIP (inr:5) Cardiac Enzymes: )No results found for this basename: CKTOTAL, CKMB, CKMBINDEX, TROPONINI,  in the last 168 hours CBG:  Recent Labs Lab 06/21/14 0740 06/21/14 1247 06/21/14 1721 06/21/14 2216 06/22/14 1129  GLUCAP 172* 218* 191* 194* 183*    Iron Studies: No results found for this basename: IRON, TIBC, TRANSFERRIN, FERRITIN,  in the last 168 hours  Xrays/Other Studies: No results found.   Assessment/Plan: 1.  AKI/CKD in setting of decompensated diastolic CHF with azotemia consistent with cardiorenal syndrome.  Pt is a poor dialysis candidate and would recommend continuing with IV diuresis for now.  Would try switching back to boluses of IV Lasix and get her off the lasix gtt as she has been on this for almost a week.  Cont with metolazone and follow H/H.  May need to discussed EOL/QOL issues with family and pt (when she is more  coherent) as she will require prolonged SNF placement and will not do well with dialysis if this is felt to be in her best interest. 2. Diastolic CHF- as above, on milrinone gtt, lasix, metolazone 3. AMS- pt is somnolent and unable to arouse long enough to answer questions or follow simple commands.  Will check ABG to r/o hypercapnea in pt with COPD, on oxygen and likely OSA due to morbid obesity.  She may benefit from CPAP trial while she is an inpt 4. Anemia- normocytic.  Possibly related to CKD or acute illness.  Will check iron stores and start epo. 5. DM- per primary svc 6. Metabolic alkalosis- on diamox.  Will stop lasix gtt and try boluses  of lasix. 7. Deconditioning- agree that she will require SNF at the minimum and possible LTC facility if she requires HD.   Madalene Mickler A 06/22/2014, 12:18 PM

## 2014-06-22 NOTE — Progress Notes (Signed)
ABG ordered per renal. Co2 high; placed on bipap per respiratory around 1500.  Repeat ABG done now, CO2 89; pt more lethargic, hard to arouse.   MD paged and notified. Will continue to monitor pt closely and update as needed.

## 2014-06-22 NOTE — Progress Notes (Signed)
PT Cancellation Note  Patient Details Name: Kimberly [name] MRN: [id number] DOB: [birth year]   Cancelled Treatment:    Reason Eval/Treat Not Completed: Medical issues which prohibited therapy   Jessina Marse 06/22/2014, 2:29 PM

## 2014-06-22 NOTE — Progress Notes (Signed)
Patient ID: Kimberly [name], female   DOB: 05-Sep-1956, 58 y.o.   MRN: [id number] Advanced Heart Failure Rounding Note   Subjective:    58 y/o admitted with diastolic HD, anasarca and a/c renal failure.   Continues on lasix drip + milrinone 0.25 mcg. Placed on diamox over the weekend without much effect. Metolazone added back. Weight down 2 lbs and UOP sluggish. Remains essentially bedbound.  No orthopnea/PND. Bicarb 42>41>43 after starting Diamox. Milrinone remains at 0.25.   Renal Ultrasound - inconclusive ECHO 06/10/14 EF 55-60% Grade II DD MV moderate stenosis. Peak PA pressure 77  Creatinine 3.25 >3.09> 2.97>2.87>2.87>2.93 Co-ox 72%>77%>75% BUN 105->103->102>100> pending Admit BUN/CR 56/2.06 CVP 20  Objective:   Weight Range:  Vital Signs:   Temp:  [97.4 F (36.3 C)-98.3 F (36.8 C)] 97.9 F (36.6 C) (10/12 0407) Pulse Rate:  [86-96] 87 (10/12 0700) Resp:  [13-24] 17 (10/12 0700) BP: (87-145)/(20-87) 87/25 mmHg (10/12 0700) SpO2:  [90 %-100 %] 99 % (10/12 0700) Weight:  [282 lb 3 oz (128 kg)] 282 lb 3 oz (128 kg) (10/12 0403) Last BM Date: 06/15/14  Weight change: Filed Weights   06/20/14 0410 06/21/14 0500 06/22/14 0403  Weight: 291 lb 0.1 oz (132 kg) 284 lb 6.3 oz (129 kg) 282 lb 3 oz (128 kg)    Intake/Output:   Intake/Output Summary (Last 24 hours) at 06/22/14 0744 Last data filed at 06/22/14 0600  Gross per 24 hour  Intake 1068.4 ml  Output   2265 ml  Net -1196.6 ml     Physical Exam: CVP ~20 General: Lying in bed. No resp difficulty HEENT: normal Neck: supple. LIJ TLC. JVP  jaw . Carotids 2+ bilat; no bruits. No lymphadenopathy or thryomegaly appreciated. RIJ  Cor: PMI nondisplaced. Regular rate & rhythm. No rubs, gallops or murmurs. Lungs: clear Abdomen: obese, nontender, +distended. No hepatosplenomegaly. No bruits or masses. Good bowel sounds. Rash under pannus  Extremities: no cyanosis, clubbing, rash, R and LLE 1-2 woody edema  Neuro: alert &  orientedx3, cranial nerves grossly intact. moves all 4 extremities w/o difficulty. Affect pleasant  Telemetry: SR 80-90s   Labs: Basic Metabolic Panel:  Recent Labs Lab 06/18/14 0440 06/19/14 0235 06/20/14 0433 06/21/14 0500 06/22/14 0604  NA 139 142 139 137 140  K 3.3* 3.5* 3.7 3.6* 3.8  CL 90* 89* 89* 85* 86*  CO2 39* 39* 42* 41* 43*  GLUCOSE 56* 39* 108* 163* 176*  BUN 105* 103* 102* 100* 101*  CREATININE 3.09* 2.97* 2.87* 2.87* 2.93*  CALCIUM 9.1 9.2 9.0 9.0 9.3    Liver Function Tests: No results found for this basename: AST, ALT, ALKPHOS, BILITOT, PROT, ALBUMIN,  in the last 168 hours No results found for this basename: LIPASE, AMYLASE,  in the last 168 hours No results found for this basename: AMMONIA,  in the last 168 hours  CBC:  Recent Labs Lab 06/18/14 0440 06/19/14 0235 06/20/14 0433 06/21/14 0500 06/22/14 0604  WBC 9.6 12.2* 8.3 8.5 9.1  NEUTROABS 7.7 9.8* 6.7 6.7 7.6  HGB 9.0* 9.0* 8.3* 8.5* 8.2*  HCT 28.6* 27.9* 26.5* 26.9* 26.4*  MCV 92.3 91.5 93.0 95.1 96.4  PLT 168 196 176 177 185    Cardiac Enzymes: No results found for this basename: CKTOTAL, CKMB, CKMBINDEX, TROPONINI,  in the last 168 hours  BNP: BNP (last 3 results)  Recent Labs  09/10/13 1827 10/13/13 1916 06/09/14 1818  PROBNP 4050.0* 1519.0* 4358.0*     Other results:  Imaging: No results found.   Medications:     Scheduled Medications: . acetaZOLAMIDE  500 mg Oral Q12H  . antiseptic oral rinse  7 mL Mouth Rinse BID  . Brinzolamide-Brimonidine  1 drop Both Eyes BID  . heparin  5,000 Units Subcutaneous 3 times per day  . insulin aspart  0-9 Units Subcutaneous TID WC  . insulin aspart  4 Units Subcutaneous TID WC  . insulin glargine  30 Units Subcutaneous BID  . levothyroxine  50 mcg Oral QAC breakfast  . metolazone  5 mg Oral BID  . metoprolol tartrate  25 mg Oral BID  . nystatin   Topical TID  . potassium chloride  20 mEq Oral Daily  . pregabalin  75 mg  Oral QPM  . sodium chloride  10-40 mL Intracatheter Q12H  . sodium chloride  10-40 mL Intracatheter Q12H  . tiotropium  18 mcg Inhalation Daily    Infusions: . furosemide (LASIX) infusion 15 mg/hr (06/22/14 0404)  . milrinone 0.25 mcg/kg/min (06/21/14 2342)    PRN Medications: albuterol, methocarbamol, morphine injection, ondansetron, oxyCODONE, sorbitol   Assessment:  1. A/C Diastolic HF ECHO EF 55-60%  2. Mitral Valve Stenosis 3. Obesity 4. DM --> Hgb A1C 7 5. Cardio Renal Syndrome 6. Hypothyroidism --> TSH 18.36  7. Immobility  8. Elevated PA pressure 77 mm hg 9. HTN    Plan/Discussion:    Diuresis slow despite diamox, lasix gtt and milrinone. CVP remains 20 and diuresis is sluggish. Creatinine up 2.93 and BUN 101. Concerned that she maybe getting close to dialysis. Will consult renal today. CO2 43 will stop diamox.   With her moderate MV stenosis not sure how much lower we are going to be able to get her CVP.   Will need SNF on d/c as she is 4-person assist to chair  Aundria Rudosgrove, Ali B, NP-C 7:44 AM  Advanced Heart Failure Team Pager [phone] (M-F; 7a - 4p)  Please contact [facility] for night-coverage after hours (4p -7a ) and weekends on [email or web address]  Diuresis is sluggish despite persistently high CVP, normal co-ox and high-dose diuretic regimen. Agree with consulting Renal. Will plan RHC today or tomorrow to assess degree ov RH failure.   Daniel Bensimhon,MD 8:59 AM

## 2014-06-23 ENCOUNTER — Inpatient Hospital Stay (HOSPITAL_COMMUNITY): Payer: Managed Care, Other (non HMO)

## 2014-06-23 LAB — BLOOD GAS, ARTERIAL
Acid-Base Excess: 18 mmol/L — ABNORMAL HIGH (ref 0.0–2.0)
BICARBONATE: 43.8 meq/L — AB (ref 20.0–24.0)
Drawn by: 41875
FIO2: 0.5 %
LHR: 14 {breaths}/min
MECHVT: 500 mL
O2 SAT: 100 %
PEEP: 5 cmH2O
Patient temperature: 98.6
TCO2: 45.9 mmol/L (ref 0–100)
pCO2 arterial: 69.3 mmHg (ref 35.0–45.0)
pH, Arterial: 7.417 (ref 7.350–7.450)
pO2, Arterial: 161 mmHg — ABNORMAL HIGH (ref 80.0–100.0)

## 2014-06-23 LAB — BASIC METABOLIC PANEL
ANION GAP: 13 (ref 5–15)
BUN: 102 mg/dL — ABNORMAL HIGH (ref 6–23)
CO2: 43 meq/L — AB (ref 19–32)
Calcium: 9.5 mg/dL (ref 8.4–10.5)
Chloride: 87 mEq/L — ABNORMAL LOW (ref 96–112)
Creatinine, Ser: 2.85 mg/dL — ABNORMAL HIGH (ref 0.50–1.10)
GFR calc non Af Amer: 17 mL/min — ABNORMAL LOW (ref >90)
GFR, EST AFRICAN AMERICAN: 20 mL/min — AB (ref >90)
Glucose, Bld: 160 mg/dL — ABNORMAL HIGH (ref 70–99)
POTASSIUM: 3 meq/L — AB (ref 3.7–5.3)
Sodium: 143 mEq/L (ref 137–147)

## 2014-06-23 LAB — CBC WITH DIFFERENTIAL/PLATELET
BASOS ABS: 0 10*3/uL (ref 0.0–0.1)
BASOS ABS: 0 10*3/uL (ref 0.0–0.1)
BASOS PCT: 0 % (ref 0–1)
Basophils Relative: 0 % (ref 0–1)
Eosinophils Absolute: 0.3 10*3/uL (ref 0.0–0.7)
Eosinophils Absolute: 0.1 10*3/uL (ref 0.0–0.7)
Eosinophils Relative: 1 % (ref 0–5)
Eosinophils Relative: 1 % (ref 0–5)
HCT: 36.2 % (ref 36.0–46.0)
HEMATOCRIT: 27.9 % — AB (ref 36.0–46.0)
HEMOGLOBIN: 8.7 g/dL — AB (ref 12.0–15.0)
Hemoglobin: 11.5 g/dL — ABNORMAL LOW (ref 12.0–15.0)
LYMPHS PCT: 17 % (ref 12–46)
Lymphocytes Relative: 5 % — ABNORMAL LOW (ref 12–46)
Lymphs Abs: 3.1 10*3/uL (ref 0.7–4.0)
Lymphs Abs: 0.7 10*3/uL (ref 0.7–4.0)
MCH: 29.9 pg (ref 26.0–34.0)
MCH: 29.4 pg (ref 26.0–34.0)
MCHC: 31.8 g/dL (ref 30.0–36.0)
MCHC: 31.2 g/dL (ref 30.0–36.0)
MCV: 94.3 fL (ref 78.0–100.0)
MCV: 94.3 fL (ref 78.0–100.0)
MONO ABS: 1.2 10*3/uL — AB (ref 0.1–1.0)
MONO ABS: 0.7 10*3/uL (ref 0.1–1.0)
MONOS PCT: 7 % (ref 3–12)
Monocytes Relative: 5 % (ref 3–12)
NEUTROS PCT: 75 % (ref 43–77)
NEUTROS PCT: 89 % — AB (ref 43–77)
Neutro Abs: 13.8 10*3/uL — ABNORMAL HIGH (ref 1.7–7.7)
Neutro Abs: 11.8 10*3/uL — ABNORMAL HIGH (ref 1.7–7.7)
Platelets: 384 10*3/uL (ref 150–400)
Platelets: 201 10*3/uL (ref 150–400)
RBC: 3.84 MIL/uL — ABNORMAL LOW (ref 3.87–5.11)
RBC: 2.96 MIL/uL — ABNORMAL LOW (ref 3.87–5.11)
RDW: 15 % (ref 11.5–15.5)
RDW: 15.1 % (ref 11.5–15.5)
WBC: 18.4 10*3/uL — ABNORMAL HIGH (ref 4.0–10.5)
WBC: 13.3 10*3/uL — AB (ref 4.0–10.5)

## 2014-06-23 LAB — URINALYSIS, ROUTINE W REFLEX MICROSCOPIC
BILIRUBIN URINE: NEGATIVE
GLUCOSE, UA: NEGATIVE mg/dL
KETONES UR: NEGATIVE mg/dL
Nitrite: NEGATIVE
Protein, ur: NEGATIVE mg/dL
Specific Gravity, Urine: 1.008 (ref 1.005–1.030)
Urobilinogen, UA: 1 mg/dL (ref 0.0–1.0)
pH: 7.5 (ref 5.0–8.0)

## 2014-06-23 LAB — RENAL FUNCTION PANEL
ALBUMIN: 3.2 g/dL — AB (ref 3.5–5.2)
ANION GAP: 11 (ref 5–15)
BUN: 92 mg/dL — ABNORMAL HIGH (ref 6–23)
CO2: 42 mEq/L (ref 19–32)
Calcium: 9.5 mg/dL (ref 8.4–10.5)
Chloride: 89 mEq/L — ABNORMAL LOW (ref 96–112)
Creatinine, Ser: 2.55 mg/dL — ABNORMAL HIGH (ref 0.50–1.10)
GFR calc non Af Amer: 19 mL/min — ABNORMAL LOW (ref >90)
GFR, EST AFRICAN AMERICAN: 23 mL/min — AB (ref >90)
Glucose, Bld: 132 mg/dL — ABNORMAL HIGH (ref 70–99)
PHOSPHORUS: 4.3 mg/dL (ref 2.3–4.6)
POTASSIUM: 3.1 meq/L — AB (ref 3.7–5.3)
SODIUM: 142 meq/L (ref 137–147)

## 2014-06-23 LAB — GLUCOSE, CAPILLARY
GLUCOSE-CAPILLARY: 148 mg/dL — AB (ref 70–99)
GLUCOSE-CAPILLARY: 171 mg/dL — AB (ref 70–99)
Glucose-Capillary: 121 mg/dL — ABNORMAL HIGH (ref 70–99)
Glucose-Capillary: 161 mg/dL — ABNORMAL HIGH (ref 70–99)
Glucose-Capillary: 168 mg/dL — ABNORMAL HIGH (ref 70–99)

## 2014-06-23 LAB — POCT ACTIVATED CLOTTING TIME
ACTIVATED CLOTTING TIME: 174 s
Activated Clotting Time: 157 seconds
Activated Clotting Time: 163 seconds
Activated Clotting Time: 163 seconds
Activated Clotting Time: 168 seconds

## 2014-06-23 LAB — URINE MICROSCOPIC-ADD ON

## 2014-06-23 LAB — CARBOXYHEMOGLOBIN
CARBOXYHEMOGLOBIN: 1.4 % (ref 0.5–1.5)
METHEMOGLOBIN: 0.7 % (ref 0.0–1.5)
O2 Saturation: 80.9 %
Total hemoglobin: 8.4 g/dL — ABNORMAL LOW (ref 12.0–16.0)

## 2014-06-23 MED ORDER — POTASSIUM CHLORIDE 10 MEQ/50ML IV SOLN
10.0000 meq | INTRAVENOUS | Status: AC
  Administered 2014-06-23 (×3): 10 meq via INTRAVENOUS
  Filled 2014-06-23 (×3): qty 50

## 2014-06-23 MED ORDER — FENTANYL CITRATE 0.05 MG/ML IJ SOLN
25.0000 ug | INTRAMUSCULAR | Status: DC | PRN
  Administered 2014-06-23: 50 ug via INTRAVENOUS

## 2014-06-23 MED ORDER — MILRINONE IN DEXTROSE 20 MG/100ML IV SOLN: 0.1250 ug/kg/min | INTRAVENOUS | Status: DC

## 2014-06-23 MED ORDER — INSULIN ASPART 100 UNIT/ML ~~LOC~~ SOLN
0.0000 [IU] | SUBCUTANEOUS | Status: DC
  Administered 2014-06-23: 4 [IU] via SUBCUTANEOUS
  Administered 2014-06-23 (×2): 3 [IU] via SUBCUTANEOUS
  Administered 2014-06-24 (×3): 4 [IU] via SUBCUTANEOUS

## 2014-06-23 MED ORDER — PRO-STAT SUGAR FREE PO LIQD
60.0000 mL | Freq: Two times a day (BID) | ORAL | Status: DC
  Administered 2014-06-23 – 2014-06-24 (×2): 60 mL
  Filled 2014-06-23 (×5): qty 60

## 2014-06-23 MED ORDER — VITAL HIGH PROTEIN PO LIQD
1000.0000 mL | ORAL | Status: DC
  Administered 2014-06-23 (×2)
  Administered 2014-06-23: 1000 mL
  Administered 2014-06-24: 07:00:00
  Administered 2014-06-24: 1000 mL
  Filled 2014-06-23 (×3): qty 1000

## 2014-06-23 MED ORDER — POTASSIUM CHLORIDE 20 MEQ/15ML (10%) PO LIQD
40.0000 meq | ORAL | Status: AC
  Administered 2014-06-23 (×2): 40 meq
  Filled 2014-06-23: qty 30

## 2014-06-23 MED ORDER — PRISMASOL BGK 4/2.5 32-4-2.5 MEQ/L IV SOLN
INTRAVENOUS | Status: DC
  Administered 2014-06-23 – 2014-06-25 (×4): via INTRAVENOUS_CENTRAL
  Filled 2014-06-23 (×6): qty 5000

## 2014-06-23 MED ORDER — INSULIN GLARGINE 100 UNIT/ML ~~LOC~~ SOLN
10.0000 [IU] | Freq: Every day | SUBCUTANEOUS | Status: DC
  Administered 2014-06-23 – 2014-06-28 (×5): 10 [IU] via SUBCUTANEOUS
  Filled 2014-06-23 (×7): qty 0.1

## 2014-06-23 MED ORDER — MIDAZOLAM HCL 2 MG/2ML IJ SOLN
2.0000 mg | Freq: Once | INTRAMUSCULAR | Status: AC
  Administered 2014-06-23: 2 mg via INTRAVENOUS

## 2014-06-23 MED ORDER — HEPARIN (PORCINE) 2000 UNITS/L FOR CRRT
INTRAVENOUS_CENTRAL | Status: DC | PRN
  Administered 2014-06-25: 02:00:00 via INTRAVENOUS_CENTRAL
  Filled 2014-06-23: qty 1000

## 2014-06-23 MED ORDER — HEPARIN SODIUM (PORCINE) 1000 UNIT/ML DIALYSIS
1000.0000 [IU] | INTRAMUSCULAR | Status: DC | PRN
  Administered 2014-06-23: 2400 [IU] via INTRAVENOUS_CENTRAL
  Filled 2014-06-23: qty 6

## 2014-06-23 MED ORDER — PRISMASOL BGK 4/2.5 32-4-2.5 MEQ/L IV SOLN
INTRAVENOUS | Status: DC
  Administered 2014-06-23 – 2014-06-26 (×8): via INTRAVENOUS_CENTRAL
  Filled 2014-06-23 (×11): qty 5000

## 2014-06-23 MED ORDER — FAMOTIDINE 40 MG/5ML PO SUSR
20.0000 mg | Freq: Every day | ORAL | Status: DC
  Administered 2014-06-23: 20 mg
  Filled 2014-06-23 (×2): qty 2.5

## 2014-06-23 MED ORDER — MIDAZOLAM HCL 2 MG/2ML IJ SOLN
INTRAMUSCULAR | Status: AC
  Administered 2014-06-23: 2 mg via INTRAVENOUS
  Filled 2014-06-23: qty 4

## 2014-06-23 MED ORDER — SODIUM CHLORIDE 0.9 % IJ SOLN
250.0000 [IU]/h | INTRAMUSCULAR | Status: DC
  Administered 2014-06-23: 250 [IU]/h via INTRAVENOUS_CENTRAL
  Administered 2014-06-24 (×3): 950 [IU]/h via INTRAVENOUS_CENTRAL
  Administered 2014-06-25: 1150 [IU]/h via INTRAVENOUS_CENTRAL
  Administered 2014-06-25: 1100 [IU]/h via INTRAVENOUS_CENTRAL
  Administered 2014-06-26: 1150 [IU]/h via INTRAVENOUS_CENTRAL
  Filled 2014-06-23 (×7): qty 2

## 2014-06-23 MED ORDER — HEPARIN BOLUS VIA INFUSION (CRRT)
1000.0000 [IU] | INTRAVENOUS | Status: DC | PRN
  Filled 2014-06-23: qty 1000

## 2014-06-23 MED ORDER — PRISMASOL BGK 4/2.5 32-4-2.5 MEQ/L IV SOLN
INTRAVENOUS | Status: DC
  Administered 2014-06-23 – 2014-06-26 (×21): via INTRAVENOUS_CENTRAL
  Filled 2014-06-23 (×28): qty 5000

## 2014-06-23 NOTE — Progress Notes (Signed)
Patient reevaluated. More lethargic and difficult to aroused. Repeat ABG with COD 89. Patient with acute hypercapnic respiratory failure. Rest of VSS stable.   Plan: -will consult PCCM for possible intubation -if intubated, will transfer to PCCM service and will resume care once extubated -renal service to continue helping/providing rec's for worsening renal disease -appreciate PCCM assistance   Vassie LollMadera, Shada Nienaber [phone]

## 2014-06-23 NOTE — Progress Notes (Signed)
PULMONARY / CRITICAL CARE MEDICINE   Name: Galen ManilaSusan K Trias MRN: [id number] DOB: 06-29-1956    ADMISSION DATE:  06/09/2014 CONSULTATION DATE:  06/23/2014  REFERRING MD :  Gwenlyn PerkingMadera  CHIEF COMPLAINT:  Acute on chronic hypercarbic respiratory failuer  INITIAL PRESENTATION: 58 y.o. F admitted 9/29 with anasarca, acute exacerbation of diastolic heart failure, AKI.  Since admission, has required lasix gtt and milrinone per recommendations of HF team - continues to have sluggish diuresis despite multiple meds.  On afternoon of 10/12, pt became more lethargic.  ABG demonstrated hypercarbic respiratory failure.  Pt placed on BiPAP but did not have any improvement.  PCCM consulted for possible need of intubation.   STUDIES:  9/29 Renal US: kidneys and bladder not visualized secondary to body habitus. 9/30 Echo: EF 55-60%, Grade 2 diastolic dysfunction, MV mod stenosis, PA peak 77. 9/30 CT abd/pelvis: limited study due to body habitus, extensive edema throughout subcutaneous fat of abdomen, small right pleural effusion, mod abdominal pelvic ascites.  SIGNIFICANT EVENTS: 9/29 admit 9/30 renal consulted 10/3 renal signoff 10/05 HF team consulted, CVL placed for CVP's and Co-Ox's. 10/06 lasix gtt, milrinone started 10/10 renal re-consulted for sluggish diuresis, AKI. 10/12 PCCM consultation for progressive resp failure, intubated 10/13 in and out PA cath by Dr Gala RomneyBensimhon. Markedly elevated PCWP and PAPs. PAC removed and HD cath placed 10/13 CRRT initiated by Renal  DEVICES: L IJ CVL 10/07 >>  ETT 10/12 >>  10/13 R IJ HD cath  MICRO DATA: resp 10/12 >>   ANTIMICROBIALS:    SUBJECTIVE:  RASS -1. + F/C  VITAL SIGNS: Temp:  [97.3 F (36.3 C)-98.7 F (37.1 C)] 98.7 F (37.1 C) (10/13 1208) Pulse Rate:  [73-115] 85 (10/13 1300) Resp:  [12-26] 22 (10/13 1300) BP: (102-152)/(34-105) 136/42 mmHg (10/13 1300) SpO2:  [99 %-100 %] 100 % (10/13 1300) FiO2 (%):  [40 %-100 %] 40 % (10/13  1300) Weight:  [128 kg (282 lb 3 oz)] 128 kg (282 lb 3 oz) (10/13 0500) HEMODYNAMICS: CVP:  [11 mmHg-27 mmHg] 19 mmHg VENTILATOR SETTINGS: Vent Mode:  [-] PRVC FiO2 (%):  [40 %-100 %] 40 % Set Rate:  [14 bmp] 14 bmp Vt Set:  [500 mL] 500 mL PEEP:  [5 cmH20] 5 cmH20 Plateau Pressure:  [21 cmH20-30 cmH20] 30 cmH20 INTAKE / OUTPUT: Intake/Output     10/12 0701 - 10/13 0700 10/13 0701 - 10/14 0700   P.O. 120    I.V. (mL/kg) 402.1 (3.1) 69.1 (0.5)   NG/GT 135    IV Piggyback 132 66   Total Intake(mL/kg) 789.1 (6.2) 135.1 (1.1)   Urine (mL/kg/hr) 3775 (1.2) 940 (1)   Total Output 3775 940   Net -2985.9 -804.9          PHYSICAL EXAMINATION: General: Morbidly obese, intubated, RASS -1, + F/C Neuro: No focal deficits noted HEENT: [place]/AT, PERRL Cardiovascular: Reg, distant HS, no M noted Lungs: no wheezes Abdomen: obese, soft, +BS, severe dependent pitting edema Ext: mod - severe symmetric pitting edema Skin: chronic venous stasis changes  LABS: I have reviewed all of today's lab results. Relevant abnormalities are discussed in the A/P section  CXR: CM, interstitial edema  ASSESSMENT / PLAN:  PULMONARY A: Acute on chronic hypercarbic respiratory failure COPD without wheezing Suspect OSA / OHS Pulm edema Severe PAH.  PASP 77 mmHg by echo, 90 mmHg by RHC P:   Cont full vent support - settings reviewed and/or adjusted No weaning until substantial further volume removal Cont vent bundle  Daily SBT if/when meets criteria Cont scheduled BDs Might benefit from trach tube  CARDIOVASCULAR A:  Acute on chronic dCHF Transient hypotension post intubation, resolved Hx HTN, HLD, CAD, PVD P:  HD mgmt per HF team  RENAL A:   AKI, nonoliguric P:   Monitor BMET intermittently Monitor I/Os Correct electrolytes as indicated CRRT to begin today  GASTROINTESTINAL A:   GI prophylaxis Nutrition P:   SUP: famotidine initiate TFs 10/13  HEMATOLOGIC A:   Anemia without  acute blood loss P:  DVT px: SQ heparin Monitor CBC intermittently Transfuse per usual ICU guidelines  INFECTIOUS A:  Possible aspiration during intubation No fever, minimal leukocytosis P:   Monitor off abx  ENDOCRINE A:   DM 2, adequately controlled Hypothyroidism  P:   Cont SSI Cont levothyroxine  NEUROLOGIC A:   Acute metabolic encephalopathy Anxiety d/o Diabetic Neuropathy P:   Sedation:  Fentanyl PRN. RASS goal: -1,-2. Daily WUA. Holding buspar, robaxin, morphine, oxycodone, lyrica.  TODAY'S SUMMARY:   Discussed with Dr Gala RomneyBensimhon and Dr Arrie Aranoladonato    I have personally obtained a history, examined the patient, evaluated laboratory and imaging results, formulated the assessment and plan and placed orders. CRITICAL CARE: The patient is critically ill with multiple organ systems failure and requires high complexity decision making for assessment and support, frequent evaluation and titration of therapies, application of advanced monitoring technologies and extensive interpretation of multiple databases. Critical Care Time devoted to patient care services described in this note is 35 minutes.    Billy Fischeravid Alianys Chacko, MD ; [facility] Mobile [phone].  After 5:30 PM or weekends, call [phone]  06/23/2014, 2:32 PM

## 2014-06-23 NOTE — Progress Notes (Signed)
Patient ID: Kimberly ManilaSusan K Inch, female   DOB: Oct 05, 1955, 58 y.o.   MRN: [id number] Advanced Heart Failure Rounding Note   Subjective:    58 y/o admitted with diastolic HD, anasarca and a/c renal failure.   Intubated overnight on 10/12 for hypercarbic resp failure. Seen by Renal felt to be poor HD candidate. Changed lasix gtt to bolus. Good urine output but weight unchanged.    Renal Ultrasound - inconclusive ECHO 06/10/14 EF 55-60% Grade II DD MV moderate stenosis. Peak PA pressure 77  Creatinine 3.25 >3.09> 2.97>2.87>2.87>2.93>2.85 Co-ox 72%>77%>75%>81% BUN 105->103->102>100> 102 Admit BUN/CR 56/2.06 CVP 20  Objective:   Weight Range:  Vital Signs:   Temp:  [97.3 F (36.3 C)-98.2 F (36.8 C)] 98.2 F (36.8 C) (10/13 0729) Pulse Rate:  [89-115] 92 (10/13 0814) Resp:  [12-22] 19 (10/13 0814) BP: (81-152)/(31-105) 128/80 mmHg (10/13 0814) SpO2:  [93 %-100 %] 100 % (10/13 0814) FiO2 (%):  [40 %-100 %] 40 % (10/13 0814) Weight:  [128 kg (282 lb 3 oz)] 128 kg (282 lb 3 oz) (10/13 0500) Last BM Date: 06/15/14  Weight change: Filed Weights   06/21/14 0500 06/22/14 0403 06/23/14 0500  Weight: 129 kg (284 lb 6.3 oz) 128 kg (282 lb 3 oz) 128 kg (282 lb 3 oz)    Intake/Output:   Intake/Output Summary (Last 24 hours) at 06/23/14 0826 Last data filed at 06/23/14 0700  Gross per 24 hour  Intake  763.3 ml  Output   3525 ml  Net -2761.7 ml     Physical Exam: CVP ~20 General: Intubated. Awake following commands HEENT: normal. + ETT Neck: supple. LIJ TLC. JVP  jaw . Carotids 2+ bilat; no bruits. No lymphadenopathy or thryomegaly appreciated. RIJ  Cor: PMI nondisplaced. Regular rate & rhythm. No rubs, gallops or murmurs. Lungs: clear Abdomen: obese, nontender, +distended. No hepatosplenomegaly. No bruits or masses. Good bowel sounds. Rash under pannus  Extremities: no cyanosis, clubbing, rash, R and LLE 2+ woody edema  Neuro: intubated awake follows commands  Telemetry: SR 80-90s    Labs: Basic Metabolic Panel:  Recent Labs Lab 06/19/14 0235 06/20/14 0433 06/21/14 0500 06/22/14 0604 06/23/14 0500  NA 142 139 137 140 143  K 3.5* 3.7 3.6* 3.8 3.0*  CL 89* 89* 85* 86* 87*  CO2 39* 42* 41* 43* 43*  GLUCOSE 39* 108* 163* 176* 160*  BUN 103* 102* 100* 101* 102*  CREATININE 2.97* 2.87* 2.87* 2.93* 2.85*  CALCIUM 9.2 9.0 9.0 9.3 9.5    Liver Function Tests: No results found for this basename: AST, ALT, ALKPHOS, BILITOT, PROT, ALBUMIN,  in the last 168 hours No results found for this basename: LIPASE, AMYLASE,  in the last 168 hours No results found for this basename: AMMONIA,  in the last 168 hours  CBC:  Recent Labs Lab 06/20/14 0433 06/21/14 0500 06/22/14 0604 06/23/14 0400 06/23/14 0500  WBC 8.3 8.5 9.1 18.4* 13.3*  NEUTROABS 6.7 6.7 7.6 13.8* 11.8*  HGB 8.3* 8.5* 8.2* 11.5* 8.7*  HCT 26.5* 26.9* 26.4* 36.2 27.9*  MCV 93.0 95.1 96.4 94.3 94.3  PLT 176 177 185 384 PENDING    Cardiac Enzymes: No results found for this basename: CKTOTAL, CKMB, CKMBINDEX, TROPONINI,  in the last 168 hours  BNP: BNP (last 3 results)  Recent Labs  09/10/13 1827 10/13/13 1916 06/09/14 1818  PROBNP 4050.0* 1519.0* 4358.0*     Other results:    Imaging: Dg Chest Port 1 View  06/23/2014   CLINICAL DATA:  Assess endotracheal tube.  EXAM: PORTABLE CHEST - 1 VIEW  COMPARISON:  06/22/2014.  FINDINGS: Endotracheal tube ends at the clavicular heads. Gastric suction tube continues to/below the diaphragm. Unchanged positioning of left IJ catheter, tip at the distal left brachiocephalic vein.  Diffuse interstitial and airspace opacity is unchanged. There may be layering pleural effusions. No pneumothorax.  No cardiomegaly. Stable aortic contours, distorted from rightward rotation.  IMPRESSION: 1. Unchanged positioning of tubes and central line. 2. Stable pulmonary edema.   Electronically Signed   By: Tiburcio Pea M.D.   On: 06/23/2014 05:08   Dg Chest Port 1  View  06/22/2014   CLINICAL DATA:  Endotracheal tube and orogastric tube. Evaluate position  EXAM: PORTABLE CHEST - 1 VIEW  COMPARISON:  Radiograph 06/17/2014  FINDINGS: Endotracheal tube is positioned with tip 3.5 cm from carinal. NG tube extends in the course of the esophagus with tip below the inferior margin of the film. Left central venous line with tip in the mid brachiocephalic vein.  Normal cardiac silhouette. There is increased in perihilar vascular congestion and mild pulmonary edema. Bibasilar atelectasis similar. No pneumothorax  IMPRESSION: 1. Endotracheal tube and NG tube appear in good position. 2. Increase in pulmonary edema pattern. 3. Stable basilar atelectasis.   Electronically Signed   By: Genevive Bi M.D.   On: 06/22/2014 18:44   Dg Abd Portable 1v  06/22/2014   CLINICAL DATA:  Evaluate OG tube.  EXAM: PORTABLE ABDOMEN - 1 VIEW  COMPARISON:  None.  FINDINGS: The examination is markedly degraded secondary to patient body habitus.  Enteric tube tip and side port projects over the expected location of the gastric antrum given obliquity.  Paucity of bowel gas without definite evidence of obstruction. Nondiagnostic evaluation for pneumoperitoneum secondary supine positioning patient body habitus. No definite pneumatosis or portal venous gas.  No definite abnormal intra-abdominal calcifications.  IMPRESSION: Degraded examination demonstrates enteric tube tip and side port overlying expected location of the gastric antrum.   Electronically Signed   By: Simonne Come M.D.   On: 06/22/2014 18:42     Medications:     Scheduled Medications: . antiseptic oral rinse  7 mL Mouth Rinse BID  . antiseptic oral rinse  7 mL Mouth Rinse QID  . Brinzolamide-Brimonidine  1 drop Both Eyes BID  . chlorhexidine  15 mL Mouth Rinse BID  . furosemide  160 mg Intravenous 3 times per day  . heparin  5,000 Units Subcutaneous 3 times per day  . insulin aspart  2-6 Units Subcutaneous 6 times per day  .  ipratropium-albuterol  3 mL Nebulization Q6H  . levothyroxine  50 mcg Per Tube QAC breakfast  . metolazone  5 mg Per Tube BID  . metoprolol tartrate  25 mg Per Tube BID  . nystatin   Topical TID  . pantoprazole (PROTONIX) IV  40 mg Intravenous Daily  . potassium chloride  20 mEq Per Tube Daily  . potassium chloride  40 mEq Per Tube Q4H  . sodium chloride  10-40 mL Intracatheter Q12H  . sodium chloride  10-40 mL Intracatheter Q12H    Infusions: . sodium chloride 10 mL/hr at 06/23/14 0600  . milrinone 0.125 mcg/kg/min (06/23/14 0700)    PRN Medications: albuterol, fentaNYL, fentaNYL, ondansetron, sorbitol   Assessment:  1. A/C Diastolic HF ECHO EF 55-60%  2. Mitral Valve Stenosis 3. Obesity 4. DM --> Hgb A1C 7 5. Cardio Renal Syndrome 6. Hypothyroidism --> TSH 18.36  7. Immobility  8.  Pulmonary HTN by echo 9. Acute hypercarbic resp failure -> intubated 10/12  Plan/Discussion:    Appreciate Renal and CCM care. Intubated yesterday for hypercarbic resp failure. I/Os seem to be improved with blus lasix but weight and CVP unchanged (20). Will plan Swan at bedside today to clearly evaluate right and left sided filling pressures. With normal co-ox can stop milrinone. Agree that [facility]   Arvilla Meresaniel Waylin Dorko, MD 8:26 AM  The patient is critically ill with multiple organ systems failure and requires high complexity decision making for assessment and support, frequent evaluation and titration of therapies, application of advanced monitoring technologies and extensive interpretation of multiple databases.   Critical Care Time devoted to patient care services described in this note is 35 Minutes.    Advanced Heart Failure Team Pager [phone] (M-F; 7a - 4p)  Please contact [facility] for night-coverage after hours (4p -7a ) and weekends on [email or web address]

## 2014-06-23 NOTE — Progress Notes (Signed)
Patient ID: Kimberly [name], female   DOB: Mar 07, 1956, 58 y.o.   MRN: [id number] S:events of last 24 hours noted, pt now intubated O:BP 106/45  Pulse 98  Temp(Src) 98.2 F (36.8 C) (Oral)  Resp 18  Ht 5\' 4"  (1.626 m)  Wt 128 kg (282 lb 3 oz)  BMI 48.41 kg/m2  SpO2 100%  Intake/Output Summary (Last 24 hours) at 06/23/14 0930 Last data filed at 06/23/14 0900  Gross per 24 hour  Intake 712.62 ml  Output   3875 ml  Net -3162.38 ml   Intake/Output: I/O last 3 completed shifts: In: 1333.7 [P.O.:355; I.V.:711.7; NG/GT:135; IV Piggyback:132] Out: 4525 [Urine:4525]  Intake/Output this shift:  Total I/O In: 95.1 [I.V.:29.1; IV Piggyback:66] Out: 350 [Urine:350] Weight change: 0 kg (0 lb) JJO:ACZYSGen:obese WF intubated and unresponsive CVS:rrr, faint HS Resp:scattered wheezes bilaterally AYT:KZSWFAbd:obese Ext:+anasarca   Recent Labs Lab 06/17/14 1350 06/18/14 0440 06/19/14 0235 06/20/14 0433 06/21/14 0500 06/22/14 0604 06/23/14 0500  NA 137 139 142 139 137 140 143  K 3.7 3.3* 3.5* 3.7 3.6* 3.8 3.0*  CL 90* 90* 89* 89* 85* 86* 87*  CO2 37* 39* 39* 42* 41* 43* 43*  GLUCOSE 59* 56* 39* 108* 163* 176* 160*  BUN 105* 105* 103* 102* 100* 101* 102*  CREATININE 3.22* 3.09* 2.97* 2.87* 2.87* 2.93* 2.85*  CALCIUM 8.9 9.1 9.2 9.0 9.0 9.3 9.5   Liver Function Tests: No results found for this basename: AST, ALT, ALKPHOS, BILITOT, PROT, ALBUMIN,  in the last 168 hours No results found for this basename: LIPASE, AMYLASE,  in the last 168 hours No results found for this basename: AMMONIA,  in the last 168 hours CBC:  Recent Labs Lab 06/20/14 0433 06/21/14 0500 06/22/14 0604 06/23/14 0400 06/23/14 0500  WBC 8.3 8.5 9.1 18.4* 13.3*  NEUTROABS 6.7 6.7 7.6 13.8* 11.8*  HGB 8.3* 8.5* 8.2* 11.5* 8.7*  HCT 26.5* 26.9* 26.4* 36.2 27.9*  MCV 93.0 95.1 96.4 94.3 94.3  PLT 176 177 185 384 PENDING   Cardiac Enzymes: No results found for this basename: CKTOTAL, CKMB, CKMBINDEX, TROPONINI,  in the last  168 hours CBG:  Recent Labs Lab 06/22/14 1129 06/22/14 1621 06/23/14 0007 06/23/14 0422 06/23/14 0728  GLUCAP 183* 217* 168* 161* 148*    Iron Studies: No results found for this basename: IRON, TIBC, TRANSFERRIN, FERRITIN,  in the last 72 hours Studies/Results: Dg Chest Port 1 View  06/23/2014   CLINICAL DATA:  Assess endotracheal tube.  EXAM: PORTABLE CHEST - 1 VIEW  COMPARISON:  06/22/2014.  FINDINGS: Endotracheal tube ends at the clavicular heads. Gastric suction tube continues to/below the diaphragm. Unchanged positioning of left IJ catheter, tip at the distal left brachiocephalic vein.  Diffuse interstitial and airspace opacity is unchanged. There may be layering pleural effusions. No pneumothorax.  No cardiomegaly. Stable aortic contours, distorted from rightward rotation.  IMPRESSION: 1. Unchanged positioning of tubes and central line. 2. Stable pulmonary edema.   Electronically Signed   By: Tiburcio PeaJonathan  Watts M.D.   On: 06/23/2014 05:08   Dg Chest Port 1 View  06/22/2014   CLINICAL DATA:  Endotracheal tube and orogastric tube. Evaluate position  EXAM: PORTABLE CHEST - 1 VIEW  COMPARISON:  Radiograph 06/17/2014  FINDINGS: Endotracheal tube is positioned with tip 3.5 cm from carinal. NG tube extends in the course of the esophagus with tip below the inferior margin of the film. Left central venous line with tip in the mid brachiocephalic vein.  Normal cardiac silhouette. There  is increased in perihilar vascular congestion and mild pulmonary edema. Bibasilar atelectasis similar. No pneumothorax  IMPRESSION: 1. Endotracheal tube and NG tube appear in good position. 2. Increase in pulmonary edema pattern. 3. Stable basilar atelectasis.   Electronically Signed   By: Genevive Bi M.D.   On: 06/22/2014 18:44   Dg Abd Portable 1v  06/22/2014   CLINICAL DATA:  Evaluate OG tube.  EXAM: PORTABLE ABDOMEN - 1 VIEW  COMPARISON:  None.  FINDINGS: The examination is markedly degraded secondary to  patient body habitus.  Enteric tube tip and side port projects over the expected location of the gastric antrum given obliquity.  Paucity of bowel gas without definite evidence of obstruction. Nondiagnostic evaluation for pneumoperitoneum secondary supine positioning patient body habitus. No definite pneumatosis or portal venous gas.  No definite abnormal intra-abdominal calcifications.  IMPRESSION: Degraded examination demonstrates enteric tube tip and side port overlying expected location of the gastric antrum.   Electronically Signed   By: Simonne Come M.D.   On: 06/22/2014 18:42   . antiseptic oral rinse  7 mL Mouth Rinse BID  . antiseptic oral rinse  7 mL Mouth Rinse QID  . Brinzolamide-Brimonidine  1 drop Both Eyes BID  . chlorhexidine  15 mL Mouth Rinse BID  . furosemide  160 mg Intravenous 3 times per day  . heparin  5,000 Units Subcutaneous 3 times per day  . insulin aspart  2-6 Units Subcutaneous 6 times per day  . ipratropium-albuterol  3 mL Nebulization Q6H  . levothyroxine  50 mcg Per Tube QAC breakfast  . metolazone  5 mg Per Tube BID  . metoprolol tartrate  25 mg Per Tube BID  . nystatin   Topical TID  . pantoprazole (PROTONIX) IV  40 mg Intravenous Daily  . potassium chloride  20 mEq Per Tube Daily  . potassium chloride  40 mEq Per Tube Q4H  . sodium chloride  10-40 mL Intracatheter Q12H  . sodium chloride  10-40 mL Intracatheter Q12H    BMET    Component Value Date/Time   NA 143 06/23/2014 0500   K 3.0* 06/23/2014 0500   CL 87* 06/23/2014 0500   CO2 43* 06/23/2014 0500   GLUCOSE 160* 06/23/2014 0500   BUN 102* 06/23/2014 0500   CREATININE 2.85* 06/23/2014 0500   CALCIUM 9.5 06/23/2014 0500   GFRNONAA 17* 06/23/2014 0500   GFRAA 20* 06/23/2014 0500   CBC    Component Value Date/Time   WBC 13.3* 06/23/2014 0500   RBC 2.96* 06/23/2014 0500   RBC 3.12* 05/10/2010 1512   HGB 8.7* 06/23/2014 0500   HCT 27.9* 06/23/2014 0500   PLT PENDING 06/23/2014 0500   MCV 94.3  06/23/2014 0500   MCH 29.4 06/23/2014 0500   MCHC 31.2 06/23/2014 0500   RDW 15.1 06/23/2014 0500   LYMPHSABS 0.7 06/23/2014 0500   MONOABS 0.7 06/23/2014 0500   EOSABS 0.1 06/23/2014 0500   BASOSABS 0.0 06/23/2014 0500     Assessment/Plan:  1. AKI/CKD in setting of decompensated diastolic CHF with azotemia consistent with cardiorenal syndrome. Pt responded well to boluses of IV Lasix and metolazone.  May need to discussed EOL/QOL issues with family and pt (when she is more coherent) as she will require prolonged SNF placement and will not do well with dialysis long term.  1. Discussed case with Dr. Sung Amabile, pt intubated due to AMS but hypercapnea present likely due to contraction alkalosis and decreased respiratory drive.  She has responded well  to IV lasix, however her azotemia and alkalosis have complicated her care and will proceed with limited trial of CVVHD for volume removal, however she would be a marginal long-term dialysis candidate at best.  2. HD cath to be placed by PCCM later today and will start CVVHD and discontinue diuretics 2. Diastolic CHF- as above, on milrinone and proceed with a limited trial of CVVHDF. (she did diurese over 3 liters yesterday but had significant electrolyte abnormalities including worsening azotemia and metabolic alkalosis) 3. AMS- pt was somnolent and unable to arouse long enough to answer questions or follow simple commands yesterday.  ABG revealed hypercapnea in pt with COPD, on oxygen and likely OSA due to morbid obesity.  1. Now intubated and will follow MS as we initiate CVVHD and improve her hypercapnea.   4. Anemia- normocytic. Possibly related to CKD or acute illness. Will check iron stores and start epo. 5. DM- per primary svc 6. Metabolic alkalosis- on diamox. Will stop lasix gtt and try boluses of lasix. 7. Deconditioning- agree that she will require SNF at the minimum and possible LTC facility if she requires HD. 8. Dispo- multi-organ failure  in a morbidly obese female.  Prognosis guarded  Batya Citron A

## 2014-06-23 NOTE — CV Procedure (Signed)
RHC numbers  RA 21 PA 90/33 (53) PCWP 31 Thermo CO/CI 5.6/2.4 PVR 4.0 WU SVR 792  Severe biventricular volume overload. 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   Clinten Howk,MD 11:53 AM

## 2014-06-23 NOTE — Progress Notes (Signed)
Foley Huddle Note  Patient had 14 Fr. Foley in place with date of 09/29 inserted due to aggressive diuresis and strict I&O. Patient leaking urine around catheter. Foley huddle completed. Peri care completed. Inserted 16Fr. Straight tip foley catheter using sterile technique. 2 RN's at bedside Corliss Marcus(Robertine Kipper and [place]). Urine return noted. Yellow/clear/ no odor. Specimen sent to lab.  Rise PaganiniURRY, Liboria Putnam R, RN [facility], [place]

## 2014-06-23 NOTE — Progress Notes (Signed)
The morning 0400 CBC with Diff are the wrong results. These lab values have been identified as questionable ID or identified as not the correct patient for the labs. Labs will be redrawn and sent to the lab.

## 2014-06-23 NOTE — Procedures (Signed)
  Kimberly ManilaSusan K [name] [id number] 30-Sep-1955  Procedures: 1) Right heart cath using Theone MurdochSwan Ganz Catheter                        2) Trialysis catheter  Indications: Respiratory failure. Renal failure  Procedure Details Consent: Risks of procedure as well as the alternatives and risks of each were explained to the (patient/caregiver).  Consent for procedure obtained. Time Out: Verified patient identification, verified procedure, site/side was marked, verified correct patient position, special equipment/implants available, medications/allergies/relevent history reviewed, required imaging and test results available.  Performed  Maximum sterile technique was used including antiseptics, cap, gloves, gown, hand hygiene, mask and sheet. Skin prep: Chlorhexidine; local anesthetic administered An 8 FR sheath was placed in the right internal jugular vein using the modified Seldinger technique. A right heart cath was performed at the bedside with a standard Theone MurdochSwan Ganz catheter. Once the RHC was completed. The Theone MurdochSwan Ganz catheter was removed and the 8FR sheath was changed over a wire and a Trialysis catheter was placed.  Evaluation Blood flow good Complications: No apparent complications Patient did tolerate procedure well. Chest X-ray ordered to verify placement.  CXR: pending.  Arvilla Meresaniel Bensimhon MD 06/23/2014, 11:55 AM

## 2014-06-23 NOTE — Progress Notes (Signed)
eLink Physician-Brief Progress Note Patient Name: Galen ManilaSusan K Dupuis DOB: 1955/11/28 MRN: [id number]   Date of Service  06/23/2014  HPI/Events of Note    eICU Interventions  Hypokalemia -repleted      Intervention Category Intermediate Interventions: Electrolyte abnormality - evaluation and management  Serafina Topham V. 06/23/2014, 6:21 AM

## 2014-06-23 NOTE — Progress Notes (Signed)
Patient on contact precautions due to positive MRSA PCR swab. Patient family educated about dawning PPE, removal of PPE, and proper hand washing.

## 2014-06-23 NOTE — Progress Notes (Signed)
INITIAL NUTRITION ASSESSMENT  DOCUMENTATION CODES Per approved criteria  -Morbid Obesity   INTERVENTION: Increase Vital High Protein by 10 ml every 4 hours to goal rate of 40 ml/hr.   60 ml Prostat BID.    Tube feeding regimen provides 1360 kcal (66% of needs), 144 grams of protein, and 802 ml of H2O.   NUTRITION DIAGNOSIS: Inadequate oral intake related to inability to eat as evidenced by NPO status  Goal: Enteral nutrition to provide 60-70% of estimated calorie needs (22-25 kcals/kg ideal body weight) and 100% of estimated protein needs, based on ASPEN guidelines for permissive underfeeding in critically ill obese individuals  Monitor:  Respiratory status, HD needs, TF initiation and tolerance, weight trends, labs  Reason for Assessment: Ventilator  58 y.o. female  Admitting Dx: Anasarca  ASSESSMENT: Pt admitted on 9/29 with anasarca, exacerbation of diastolic heart failure and AKI. Pt has required lasix gtt and milrinone. Pt became more lethargic 10/12, failed BiPAP and intubated.  Pt started CVVHD 10/13 due to cardiorenal syndrome.   Patient is currently intubated on ventilator support MV: 10.5 L/min Temp (24hrs), Avg:98.1 F (36.7 C), Min:97.3 F (36.3 C), Max:98.7 F (37.1 C)  Propofol: none  Labs:  Potassium low BUN/Cr elevated Cbg: 148-168  Pt awake and alert. Vital High Protein infusing at 20 ml/hr.   Height: Ht Readings from Last 1 Encounters:  06/10/14 5\' 4"  (1.626 m)    Weight: Wt Readings from Last 1 Encounters:  06/23/14 282 lb 3 oz (128 kg)    Ideal Body Weight: 54.5 kg   % Ideal Body Weight: 234%  Wt Readings from Last 10 Encounters:  06/23/14 282 lb 3 oz (128 kg)  12/02/13 265 lb (120.203 kg)  11/28/13 269 lb 6.4 oz (122.199 kg)  10/16/13 274 lb 0.5 oz (124.3 kg)  09/15/13 253 lb 3.5 oz (114.86 kg)  09/15/12 300 lb (136.079 kg)  01/18/12 279 lb (126.554 kg)  12/18/11 279 lb (126.554 kg)  09/15/10 238 lb (107.956 kg)  08/03/10  243 lb (110.224 kg)    Usual Body Weight: 250-280 lb   % Usual Body Weight: > 100%  BMI:  Body mass index is 48.41 kg/(m^2).  Estimated Nutritional Needs: Kcal: 2062 Protein: >/= 136 grams Fluid: 1.5 L  Skin:  Skin tear, diabetic foot ulcer  Diet Order: NPO  EDUCATION NEEDS: -No education needs identified at this time   Intake/Output Summary (Last 24 hours) at 06/23/14 0916 Last data filed at 06/23/14 0900  Gross per 24 hour  Intake 712.62 ml  Output   3875 ml  Net -3162.38 ml    Last BM: 10/5, RN aware of constipation    Labs:   Recent Labs Lab 06/21/14 0500 06/22/14 0604 06/23/14 0500  NA 137 140 143  K 3.6* 3.8 3.0*  CL 85* 86* 87*  CO2 41* 43* 43*  BUN 100* 101* 102*  CREATININE 2.87* 2.93* 2.85*  CALCIUM 9.0 9.3 9.5  GLUCOSE 163* 176* 160*    CBG (last 3)   Recent Labs  06/23/14 0007 06/23/14 0422 06/23/14 0728  GLUCAP 168* 161* 148*    Scheduled Meds: . antiseptic oral rinse  7 mL Mouth Rinse BID  . antiseptic oral rinse  7 mL Mouth Rinse QID  . Brinzolamide-Brimonidine  1 drop Both Eyes BID  . chlorhexidine  15 mL Mouth Rinse BID  . furosemide  160 mg Intravenous 3 times per day  . heparin  5,000 Units Subcutaneous 3 times per day  .  insulin aspart  2-6 Units Subcutaneous 6 times per day  . ipratropium-albuterol  3 mL Nebulization Q6H  . levothyroxine  50 mcg Per Tube QAC breakfast  . metolazone  5 mg Per Tube BID  . metoprolol tartrate  25 mg Per Tube BID  . nystatin   Topical TID  . pantoprazole (PROTONIX) IV  40 mg Intravenous Daily  . potassium chloride  20 mEq Per Tube Daily  . potassium chloride  40 mEq Per Tube Q4H  . sodium chloride  10-40 mL Intracatheter Q12H  . sodium chloride  10-40 mL Intracatheter Q12H    Continuous Infusions: . sodium chloride 10 mL/hr at 06/23/14 0600    Past Medical History  Diagnosis Date  . HYPERLIPIDEMIA   . HYPERTENSION   . CAD, NATIVE VESSEL     May 10, 2010 cath showed a  hyperdynamic LV function, she had dominant circumflex anatomy with a 70-80% small OM1. She had diffuse diabetic plaque particularly in the distal LAD. She nondominant RCA.  Nondominant  . PVD     CEA  . DM   . COPD   . Edema   . Stroke   . CHF (congestive heart failure)     Preserved EF  . Complication of anesthesia     DIFFICULT WAKING   . Cellulitis 10/15/2013    BILATERAL  . Chronic kidney disease   . Diabetic neuropathy   . Anasarca 05/2014    Past Surgical History  Procedure Laterality Date  . Eye surgery    . Carotid endarterectomy      Kendell BaneHeather Marirose Deveney RD, LDN, CNSC [phone] Pager [phone] After Hours Pager

## 2014-06-23 NOTE — Progress Notes (Signed)
RT informed RN of panic value on ABG , PCO2 of 69

## 2014-06-23 NOTE — Progress Notes (Signed)
Physical Therapy Discharge Patient Details Name: Kimberly ManilaSusan K [name] MRN: [id number] DOB: 1955-11-14 Today's Date: 06/23/2014 Time:  -     Patient discharged from PT services secondary to medical decline - will need to re-order PT to resume therapy services.  Please see latest therapy progress note for current level of functioning and progress toward goals.    Progress and discharge plan discussed with patient and/or caregiver: Patient unable to participate in discharge planning and no caregivers available  Pt active on PT caseload, and became lethargic and difficult to arouse on 06/22/14. Pt was intubated and is currently sedated. Discussed with RN that PT will sign off at this time and MD will need to reorder PT when medically ready.  Kimberly [name], Kimberly [name] 06/23/2014, 2:15 PM   Kimberly [name], PT, DPT Acute Rehabilitation Services Pager: [phone]

## 2014-06-23 NOTE — Progress Notes (Signed)
Milrinon has been infusing at 0.13525mcg via night

## 2014-06-24 ENCOUNTER — Inpatient Hospital Stay (HOSPITAL_COMMUNITY): Payer: Managed Care, Other (non HMO)

## 2014-06-24 LAB — RENAL FUNCTION PANEL
Albumin: 3 g/dL — ABNORMAL LOW (ref 3.5–5.2)
Anion gap: 10 (ref 5–15)
BUN: 50 mg/dL — ABNORMAL HIGH (ref 6–23)
CHLORIDE: 98 meq/L (ref 96–112)
CO2: 32 meq/L (ref 19–32)
Calcium: 8.8 mg/dL (ref 8.4–10.5)
Creatinine, Ser: 1.34 mg/dL — ABNORMAL HIGH (ref 0.50–1.10)
GFR, EST AFRICAN AMERICAN: 49 mL/min — AB (ref >90)
GFR, EST NON AFRICAN AMERICAN: 42 mL/min — AB (ref >90)
Glucose, Bld: 177 mg/dL — ABNORMAL HIGH (ref 70–99)
POTASSIUM: 4.1 meq/L (ref 3.7–5.3)
Phosphorus: 2.3 mg/dL (ref 2.3–4.6)
SODIUM: 140 meq/L (ref 137–147)

## 2014-06-24 LAB — CARBOXYHEMOGLOBIN
Carboxyhemoglobin: 1.9 % — ABNORMAL HIGH (ref 0.5–1.5)
Methemoglobin: 0.5 % (ref 0.0–1.5)
O2 SAT: 68.1 %
TOTAL HEMOGLOBIN: 8.9 g/dL — AB (ref 12.0–16.0)

## 2014-06-24 LAB — COMPREHENSIVE METABOLIC PANEL
ALT: 7 U/L (ref 0–35)
AST: 15 U/L (ref 0–37)
Albumin: 3.1 g/dL — ABNORMAL LOW (ref 3.5–5.2)
Alkaline Phosphatase: 101 U/L (ref 39–117)
Anion gap: 11 (ref 5–15)
BILIRUBIN TOTAL: 1.2 mg/dL (ref 0.3–1.2)
BUN: 62 mg/dL — AB (ref 6–23)
CO2: 35 mEq/L — ABNORMAL HIGH (ref 19–32)
CREATININE: 1.76 mg/dL — AB (ref 0.50–1.10)
Calcium: 9 mg/dL (ref 8.4–10.5)
Chloride: 94 mEq/L — ABNORMAL LOW (ref 96–112)
GFR calc Af Amer: 35 mL/min — ABNORMAL LOW (ref >90)
GFR, EST NON AFRICAN AMERICAN: 31 mL/min — AB (ref >90)
GLUCOSE: 204 mg/dL — AB (ref 70–99)
Potassium: 3.3 mEq/L — ABNORMAL LOW (ref 3.7–5.3)
Sodium: 140 mEq/L (ref 137–147)
Total Protein: 7.6 g/dL (ref 6.0–8.3)

## 2014-06-24 LAB — POCT ACTIVATED CLOTTING TIME
ACTIVATED CLOTTING TIME: 185 s
ACTIVATED CLOTTING TIME: 174 s
ACTIVATED CLOTTING TIME: 191 s
ACTIVATED CLOTTING TIME: 191 s
Activated Clotting Time: 180 seconds
Activated Clotting Time: 191 seconds
Activated Clotting Time: 202 seconds
Activated Clotting Time: 197 seconds
Activated Clotting Time: 202 seconds
Activated Clotting Time: 197 seconds

## 2014-06-24 LAB — GLUCOSE, CAPILLARY
GLUCOSE-CAPILLARY: 190 mg/dL — AB (ref 70–99)
GLUCOSE-CAPILLARY: 179 mg/dL — AB (ref 70–99)
Glucose-Capillary: 127 mg/dL — ABNORMAL HIGH (ref 70–99)
Glucose-Capillary: 142 mg/dL — ABNORMAL HIGH (ref 70–99)
Glucose-Capillary: 146 mg/dL — ABNORMAL HIGH (ref 70–99)
Glucose-Capillary: 173 mg/dL — ABNORMAL HIGH (ref 70–99)
Glucose-Capillary: 185 mg/dL — ABNORMAL HIGH (ref 70–99)

## 2014-06-24 LAB — CBC
HEMATOCRIT: 28.4 % — AB (ref 36.0–46.0)
Hemoglobin: 8.9 g/dL — ABNORMAL LOW (ref 12.0–15.0)
MCH: 29.9 pg (ref 26.0–34.0)
MCHC: 31.3 g/dL (ref 30.0–36.0)
MCV: 95.3 fL (ref 78.0–100.0)
Platelets: 195 10*3/uL (ref 150–400)
RBC: 2.98 MIL/uL — ABNORMAL LOW (ref 3.87–5.11)
RDW: 16.1 % — ABNORMAL HIGH (ref 11.5–15.5)
WBC: 15.4 10*3/uL — ABNORMAL HIGH (ref 4.0–10.5)

## 2014-06-24 LAB — TSH: TSH: 9.12 u[IU]/mL — ABNORMAL HIGH (ref 0.350–4.500)

## 2014-06-24 LAB — APTT: aPTT: 98 seconds — ABNORMAL HIGH (ref 24–37)

## 2014-06-24 LAB — MAGNESIUM: MAGNESIUM: 2.1 mg/dL (ref 1.5–2.5)

## 2014-06-24 MED ORDER — POTASSIUM CHLORIDE CRYS ER 20 MEQ PO TBCR: 40.0000 meq | EXTENDED_RELEASE_TABLET | Freq: Every day | ORAL | Status: DC

## 2014-06-24 MED ORDER — POTASSIUM CHLORIDE 20 MEQ/15ML (10%) PO LIQD
40.0000 meq | Freq: Every day | ORAL | Status: DC
  Administered 2014-06-24: 40 meq
  Filled 2014-06-24: qty 30

## 2014-06-24 MED ORDER — INSULIN ASPART 100 UNIT/ML ~~LOC~~ SOLN
0.0000 [IU] | SUBCUTANEOUS | Status: DC
  Administered 2014-06-24: 2 [IU] via SUBCUTANEOUS
  Administered 2014-06-24: 5 [IU] via SUBCUTANEOUS
  Administered 2014-06-24: 3 [IU] via SUBCUTANEOUS
  Administered 2014-06-25 (×3): 2 [IU] via SUBCUTANEOUS
  Administered 2014-06-25: 3 [IU] via SUBCUTANEOUS
  Administered 2014-06-25 (×2): 2 [IU] via SUBCUTANEOUS
  Administered 2014-06-26: 3 [IU] via SUBCUTANEOUS
  Administered 2014-06-26: 2 [IU] via SUBCUTANEOUS
  Administered 2014-06-26 (×3): 3 [IU] via SUBCUTANEOUS
  Administered 2014-06-27 – 2014-06-28 (×5): 2 [IU] via SUBCUTANEOUS
  Administered 2014-06-28: 3 [IU] via SUBCUTANEOUS
  Administered 2014-06-28: 11 [IU] via SUBCUTANEOUS

## 2014-06-24 MED ORDER — METOPROLOL SUCCINATE ER 50 MG PO TB24
50.0000 mg | ORAL_TABLET | Freq: Every day | ORAL | Status: DC
  Administered 2014-06-25 – 2014-07-05 (×10): 50 mg via ORAL
  Filled 2014-06-24 (×12): qty 1

## 2014-06-24 MED ORDER — FENTANYL CITRATE 0.05 MG/ML IJ SOLN
12.5000 ug | INTRAMUSCULAR | Status: DC | PRN
  Administered 2014-06-25: 25 ug via INTRAVENOUS
  Filled 2014-06-24: qty 2

## 2014-06-24 MED ORDER — POTASSIUM CHLORIDE CRYS ER 20 MEQ PO TBCR
40.0000 meq | EXTENDED_RELEASE_TABLET | Freq: Every day | ORAL | Status: DC
  Administered 2014-06-25: 40 meq via ORAL
  Filled 2014-06-24 (×2): qty 2

## 2014-06-24 MED ORDER — LEVOTHYROXINE SODIUM 50 MCG PO TABS
50.0000 ug | ORAL_TABLET | Freq: Every day | ORAL | Status: DC
  Administered 2014-06-25 – 2014-07-05 (×11): 50 ug via ORAL
  Filled 2014-06-24 (×13): qty 1

## 2014-06-24 NOTE — Progress Notes (Signed)
Patient failed bedside swallow eval secondary to history of dysphagia.  SLP consult ordered & will continue to keep patient NPO. [place], Kimberly [name]

## 2014-06-24 NOTE — Progress Notes (Signed)
Dr. Gala RomneyBensimhon requesting to keep central line in place for now. [place], Mitzi HansenJessica Marie

## 2014-06-24 NOTE — Progress Notes (Signed)
Patient ID: Galen ManilaSusan K Siddall, female   DOB: 06-Sep-1956, 58 y.o.   MRN: [id number] Advanced Heart Failure Rounding Note   Subjective:    58 y/o admitted with diastolic HD, anasarca and a/c renal failure.   Renal Ultrasound - inconclusive ECHO 06/10/14 EF 55-60% Grade II DD MV moderate stenosis. Peak PA pressure 77  Intubated 06/22/14 for hypercarbic resp failure. Seen by Renal felt to be poor HD candidate. On CVVHD, 24 hr I/O -5.5 liters. CVP 15  (06/23/14) RA 21  PA 90/33 (53)  PCWP 31  Thermo CO/CI 5.6/2.4  PVR 4.0 WU  SVR 792  Creatinine 1.76 Co-ox 68% BUN 62 Admit BUN/CR 56/2.06   Objective:   Weight Range:  Vital Signs:   Temp:  [97.8 F (36.6 C)-98.7 F (37.1 C)] 98.3 F (36.8 C) (10/14 0700) Pulse Rate:  [73-101] 86 (10/14 0820) Resp:  [13-26] 19 (10/14 0820) BP: (91-200)/(33-96) 140/47 mmHg (10/14 0820) SpO2:  [99 %-100 %] 100 % (10/14 0820) FiO2 (%):  [40 %] 40 % (10/14 0820) Weight:  [114 kg (251 lb 5.2 oz)] 114 kg (251 lb 5.2 oz) (10/14 0347) Last BM Date: 06/15/14  Weight change: Filed Weights   06/22/14 0403 06/23/14 0500 06/24/14 0347  Weight: 128 kg (282 lb 3 oz) 128 kg (282 lb 3 oz) 114 kg (251 lb 5.2 oz)    Intake/Output:   Intake/Output Summary (Last 24 hours) at 06/24/14 0839 Last data filed at 06/24/14 0800  Gross per 24 hour  Intake 1161.65 ml  Output   6967 ml  Net -5805.35 ml     Physical Exam: CVP ~15 General: Intubated. Awake following commands HEENT: normal. + ETT Neck: supple. LIJ TLC. JVP  jaw . Carotids 2+ bilat; no bruits. No lymphadenopathy or thryomegaly appreciated. RIJ  Cor: PMI nondisplaced. Regular rate & rhythm. No rubs, gallops or murmurs. Lungs: clear Abdomen: obese, nontender, +distended. No hepatosplenomegaly. No bruits or masses. Good bowel sounds. Rash under pannus  Extremities: no cyanosis, clubbing, rash, R and LLE 2+ woody edema  Neuro: intubated awake follows commands  Telemetry: SR 80-90s   Labs: Basic  Metabolic Panel:  Recent Labs Lab 06/21/14 0500 06/22/14 0604 06/23/14 0500 06/23/14 1600 06/24/14 0420  NA 137 140 143 142 140  K 3.6* 3.8 3.0* 3.1* 3.3*  CL 85* 86* 87* 89* 94*  CO2 41* 43* 43* 42* 35*  GLUCOSE 163* 176* 160* 132* 204*  BUN 100* 101* 102* 92* 62*  CREATININE 2.87* 2.93* 2.85* 2.55* 1.76*  CALCIUM 9.0 9.3 9.5 9.5 9.0  MG  --   --   --   --  2.1  PHOS  --   --   --  4.3  --     Liver Function Tests:  Recent Labs Lab 06/23/14 1600 06/24/14 0420  AST  --  15  ALT  --  7  ALKPHOS  --  101  BILITOT  --  1.2  PROT  --  7.6  ALBUMIN 3.2* 3.1*   No results found for this basename: LIPASE, AMYLASE,  in the last 168 hours No results found for this basename: AMMONIA,  in the last 168 hours  CBC:  Recent Labs Lab 06/20/14 0433 06/21/14 0500 06/22/14 0604 06/23/14 0400 06/23/14 0500 06/24/14 0420  WBC 8.3 8.5 9.1 18.4* 13.3* 15.4*  NEUTROABS 6.7 6.7 7.6 13.8* 11.8*  --   HGB 8.3* 8.5* 8.2* 11.5* 8.7* 8.9*  HCT 26.5* 26.9* 26.4* 36.2 27.9* 28.4*  MCV 93.0 95.1  96.4 94.3 94.3 95.3  PLT 176 177 185 384 201 195    Cardiac Enzymes: No results found for this basename: CKTOTAL, CKMB, CKMBINDEX, TROPONINI,  in the last 168 hours  BNP: BNP (last 3 results)  Recent Labs  09/10/13 1827 10/13/13 1916 06/09/14 1818  PROBNP 4050.0* 1519.0* 4358.0*     Other results:    Imaging: Dg Chest Port 1 View  06/24/2014   CLINICAL DATA:  Respiratory failure, endotracheal tube positioning.  EXAM: PORTABLE CHEST - 1 VIEW  COMPARISON:  06/23/2014  FINDINGS: Endotracheal tube tip 2.7 cm above the carina. Left IJ line tip: Brachycephalic vein. Right IJ line tip: Lower SVC.  Nasogastric tube enters the stomach.  Stable moderately enlarged cardiopericardial silhouette. Worsened bilateral interstitial and airspace opacity with indistinct pulmonary vasculature. Deformity from left proximal humeral fracture noted.  IMPRESSION: 1. Worsened acute pulmonary edema. Tubes  and lines unchanged imposition. ET tube satisfactorily positioned.   Electronically Signed   By: Herbie BaltimoreWalt  Liebkemann M.D.   On: 06/24/2014 07:54   Dg Chest Port 1 View  06/23/2014   CLINICAL DATA:  Dialysis catheter placement.  Pulmonary edema.  EXAM: PORTABLE CHEST - 1 VIEW  COMPARISON:  06/23/2014 at 4:53 a.m. and 06/22/2014  FINDINGS: Endotracheal tube, left central catheter, NG tube, and new double lumen central venous catheter all appear in good position. The tip of the dialysis catheter is in the superior vena cava above the cavoatrial junction. No pneumothorax.  Pulmonary edema has improved. Heart size and pulmonary vascularity are normal.  Old healed fracture of the proximal left humerus. No acute osseous abnormality.  IMPRESSION: New dialysis catheter appears in good position. Improved pulmonary edema.   Electronically Signed   By: Geanie CooleyJim  Maxwell M.D.   On: 06/23/2014 12:17   Dg Chest Port 1 View  06/23/2014   CLINICAL DATA:  Assess endotracheal tube.  EXAM: PORTABLE CHEST - 1 VIEW  COMPARISON:  06/22/2014.  FINDINGS: Endotracheal tube ends at the clavicular heads. Gastric suction tube continues to/below the diaphragm. Unchanged positioning of left IJ catheter, tip at the distal left brachiocephalic vein.  Diffuse interstitial and airspace opacity is unchanged. There may be layering pleural effusions. No pneumothorax.  No cardiomegaly. Stable aortic contours, distorted from rightward rotation.  IMPRESSION: 1. Unchanged positioning of tubes and central line. 2. Stable pulmonary edema.   Electronically Signed   By: Tiburcio PeaJonathan  Watts M.D.   On: 06/23/2014 05:08   Dg Chest Port 1 View  06/22/2014   CLINICAL DATA:  Endotracheal tube and orogastric tube. Evaluate position  EXAM: PORTABLE CHEST - 1 VIEW  COMPARISON:  Radiograph 06/17/2014  FINDINGS: Endotracheal tube is positioned with tip 3.5 cm from carinal. NG tube extends in the course of the esophagus with tip below the inferior margin of the film. Left  central venous line with tip in the mid brachiocephalic vein.  Normal cardiac silhouette. There is increased in perihilar vascular congestion and mild pulmonary edema. Bibasilar atelectasis similar. No pneumothorax  IMPRESSION: 1. Endotracheal tube and NG tube appear in good position. 2. Increase in pulmonary edema pattern. 3. Stable basilar atelectasis.   Electronically Signed   By: Genevive BiStewart  Edmunds M.D.   On: 06/22/2014 18:44   Dg Abd Portable 1v  06/22/2014   CLINICAL DATA:  Evaluate OG tube.  EXAM: PORTABLE ABDOMEN - 1 VIEW  COMPARISON:  None.  FINDINGS: The examination is markedly degraded secondary to patient body habitus.  Enteric tube tip and side port projects over the expected  location of the gastric antrum given obliquity.  Paucity of bowel gas without definite evidence of obstruction. Nondiagnostic evaluation for pneumoperitoneum secondary supine positioning patient body habitus. No definite pneumatosis or portal venous gas.  No definite abnormal intra-abdominal calcifications.  IMPRESSION: Degraded examination demonstrates enteric tube tip and side port overlying expected location of the gastric antrum.   Electronically Signed   By: Simonne Come M.D.   On: 06/22/2014 18:42     Medications:     Scheduled Medications: . antiseptic oral rinse  7 mL Mouth Rinse QID  . Brinzolamide-Brimonidine  1 drop Both Eyes BID  . chlorhexidine  15 mL Mouth Rinse BID  . famotidine  20 mg Per Tube QHS  . feeding supplement (PRO-STAT SUGAR FREE 64)  60 mL Per Tube BID  . feeding supplement (VITAL HIGH PROTEIN)  1,000 mL Per Tube Q24H  . insulin aspart  0-20 Units Subcutaneous 6 times per day  . insulin glargine  10 Units Subcutaneous QHS  . ipratropium-albuterol  3 mL Nebulization Q6H  . levothyroxine  50 mcg Per Tube QAC breakfast  . metoprolol tartrate  25 mg Per Tube BID  . nystatin   Topical TID  . potassium chloride  40 mEq Per Tube Daily  . sodium chloride  10-40 mL Intracatheter Q12H  .  sodium chloride  10-40 mL Intracatheter Q12H    Infusions: . sodium chloride Stopped (06/23/14 1856)  . heparin 10,000 units/ 20 mL infusion syringe 950 Units/hr (06/24/14 0800)  . dialysis replacement fluid (prismasate) 500 mL/hr at 06/24/14 0054  . dialysis replacement fluid (prismasate) 300 mL/hr at 06/24/14 0759  . dialysate (PRISMASATE) 1,500 mL/hr at 06/24/14 0758    PRN Medications: albuterol, fentaNYL, heparin, heparin, heparin, ondansetron, sorbitol   Assessment:  1. A/C Diastolic HF ECHO EF 55-60%  2. Mitral Valve Stenosis 3. Obesity 4. DM --> Hgb A1C 7 5. Cardio Renal Syndrome 6. Hypothyroidism --> TSH 18.36  7. Immobility  8. Pulmonary venous HTN  9. Acute hypercarbic resp failure -> intubated 10/12 10. AKI, on CVVHD 11. Hypokalemia  Plan/Discussion:    Yesterday (10/13) placed RIJ swan and trialysis catheter. She was started on CVVHD and has had -5.5 liters off all night. SBP stable and hemodynamics improving. She continues to be volume overloaded will continue CVVHD. Will supplement K+.   Agree that she will likely not be good HD candidate unless at [facility]   [name], NP-C 8:39 AM  Advanced Heart Failure Team Pager [phone] (M-F; 7a - 4p)  Please contact [facility] for night-coverage after hours (4p -7a ) and weekends on [email or web address]  Patient seen and examined with Ulla Potash, NP. We discussed all aspects of the encounter. I agree with the assessment and plan as stated above.   Remains intubated. Now on CVVHD with good response. RHC numbers reviewed. She has severe pulmonary HTN but mostly pulmonary venous HTN so not candidate for selective pulmonary vasodilators.   Continue CVVHD. Appreciate renal's input. Remains to be seen if she will be long-term HD candidate.   The patient is critically ill with multiple organ systems failure and requires high complexity decision making for assessment and support, frequent evaluation and titration of  therapies, application of advanced monitoring technologies and extensive interpretation of multiple databases.   Critical Care Time devoted to patient care services described in this note is 35 Minutes.    Daniel Bensimhon,MD 8:40 AM

## 2014-06-24 NOTE — Progress Notes (Signed)
PULMONARY / CRITICAL CARE MEDICINE   Name: Galen ManilaSusan K Seaberry MRN: [id number] DOB: July 18, 1956    ADMISSION DATE:  06/09/2014 CONSULTATION DATE:  06/24/2014  REFERRING MD :  Gwenlyn PerkingMadera  CHIEF COMPLAINT:  Acute on chronic hypercarbic respiratory failuer  INITIAL PRESENTATION: 58 y.o. F admitted 9/29 with anasarca, acute exacerbation of diastolic heart failure, AKI.  Since admission, has required lasix gtt and milrinone per recommendations of HF team - continues to have sluggish diuresis despite multiple meds.  On afternoon of 10/12, pt became more lethargic.  ABG demonstrated hypercarbic respiratory failure.  Pt placed on BiPAP but did not have any improvement.  PCCM consulted for possible need of intubation.   STUDIES:  9/29 Renal US: kidneys and bladder not visualized secondary to body habitus. 9/30 Echo: EF 55-60%, Grade 2 diastolic dysfunction, MV mod stenosis, PA peak 77. 9/30 CT abd/pelvis: limited study due to body habitus, extensive edema throughout subcutaneous fat of abdomen, small right pleural effusion, mod abdominal pelvic ascites.  SIGNIFICANT EVENTS: 9/29 admit 9/30 renal consulted 10/3 renal signoff 10/05 HF team consulted, CVL placed for CVP's and Co-Ox's. 10/06 lasix gtt, milrinone started 10/10 renal re-consulted for sluggish diuresis, AKI. 10/12 PCCM consultation for progressive resp failure, intubated 10/13 in and out PA cath by Dr Gala RomneyBensimhon. Markedly elevated PCWP and PAPs. PAC removed and HD cath placed 10/13 CRRT initiated by Renal 10/14 negative > 5.5 liters over 24 hrs. Passed SBT. RASS 0, CAM-ICU negative. Extubated successfully. CRRT conitnued  DEVICES: L IJ CVL 10/07 >>  ETT 10/12 >> 10/14 10/13 R IJ HD cath >>   MICRO DATA: resp 10/12 >>   ANTIMICROBIALS:    SUBJECTIVE:  RASS 0. + F/C  VITAL SIGNS: Temp:  [97.5 F (36.4 C)-98.3 F (36.8 C)] 97.5 F (36.4 C) (10/14 2000) Pulse Rate:  [74-92] 86 (10/14 2100) Resp:  [12-25] 12 (10/14 2100) BP:  (101-143)/(30-85) 120/32 mmHg (10/14 2100) SpO2:  [99 %-100 %] 100 % (10/14 2100) FiO2 (%):  [40 %] 40 % (10/14 0820) Weight:  [114 kg (251 lb 5.2 oz)] 114 kg (251 lb 5.2 oz) (10/14 0347) HEMODYNAMICS: CVP:  [7 mmHg-14 mmHg] 14 mmHg VENTILATOR SETTINGS: Vent Mode:  [-] PSV;CPAP FiO2 (%):  [40 %] 40 % Set Rate:  [14 bmp] 14 bmp Vt Set:  [500 mL] 500 mL PEEP:  [5 cmH20] 5 cmH20 Pressure Support:  [5 cmH20] 5 cmH20 Plateau Pressure:  [30 cmH20] 30 cmH20 INTAKE / OUTPUT: Intake/Output     10/14 0701 - 10/15 0700   I.V. (mL/kg)    NG/GT 535   IV Piggyback    Total Intake(mL/kg) 535 (4.7)   Urine (mL/kg/hr) 479 (0.3)   Emesis/NG output    Other 2853 (1.7)   Total Output 3332   Net -2797         PHYSICAL EXAMINATION: General: Morbidly obese, intubated, RASS 0, + F/C Neuro: No focal deficits noted HEENT: [place]/AT, PERRL Cardiovascular: Reg, distant HS, no M noted Lungs: no wheezes Abdomen: obese, soft, +BS, severe dependent pitting edema Ext: mod - severe symmetric pitting edema Skin: chronic venous stasis changes  LABS: I have reviewed all of today's lab results. Relevant abnormalities are discussed in the A/P section  CXR: CM, interstitial edema  ASSESSMENT / PLAN:  PULMONARY A: Acute on chronic hypercarbic respiratory failure COPD without wheezing Suspect OSA / OHS Pulm edema Severe PAH.  PASP 77 mmHg by echo, 90 mmHg by RHC P:   Monitor in ICU post extubation Supplemental O2  to maintain SpO2 90-95%  CARDIOVASCULAR A:  Acute on chronic dCHF Transient hypotension post intubation, resolved Hx HTN, HLD, CAD, PVD P:  Hemodynamic mgmt per HF team  RENAL A:   AKI, nonoliguric P:   Monitor BMET intermittently Monitor I/Os Correct electrolytes as indicated CRRT per Renal Service  GASTROINTESTINAL A:   GI prophylaxis Nutrition P:   SUP: N/I post extubation NPO post extubation  HEMATOLOGIC A:   Anemia without acute blood loss P:  DVT px: Full dose  UFH Monitor CBC intermittently Transfuse per usual ICU guidelines Decide on whether long term anticoagulation indicated  INFECTIOUS A:  Possible aspiration during intubation No fever, minimal leukocytosis P:   Monitor off abx  ENDOCRINE A:   DM 2, adequately controlled Hypothyroidism  P:   Cont SSI Cont levothyroxine  NEUROLOGIC A:   Acute metabolic encephalopathy, resolved Anxiety d/o Diabetic Neuropathy P:   RASS goal: 0 Holding buspar, robaxin, morphine, oxycodone, lyrica.  TODAY'S SUMMARY:     I have personally obtained a history, examined the patient, evaluated laboratory and imaging results, formulated the assessment and plan and placed orders. CRITICAL CARE: The patient is critically ill with multiple organ systems failure and requires high complexity decision making for assessment and support, frequent evaluation and titration of therapies, application of advanced monitoring technologies and extensive interpretation of multiple databases. Critical Care Time devoted to patient care services described in this note is 35 minutes.    Billy Fischeravid Lacreasha Hinds, MD ; [facility] [phone].  After 5:30 PM or weekends, call [phone]  06/24/2014, 9:38 PM

## 2014-06-24 NOTE — Progress Notes (Signed)
Patient ID: Kimberly [name], female   DOB: [birth year], 58 y.o.   MRN: [id number] Advanced Heart Failure Rounding Note   Subjective:    58 y/o admitted with diastolic HD, anasarca and a/c renal failure.   Renal Ultrasound - inconclusive ECHO 06/10/14 EF 55-60% Grade II DD MV moderate stenosis. Peak PA pressure 77  Intubated 06/22/14 for hypercarbic resp failure. Seen by Renal felt to be poor HD candidate. On CVVHD, 24 hr I/O -5.5 liters. CVP 15  (06/23/14) RA 21  PA 90/33 (53)  PCWP 31  Thermo CO/CI 5.6/2.4  PVR 4.0 WU  SVR 792  Creatinine 1.76 Co-ox 68% BUN 62 Admit BUN/CR 56/2.06   Objective:   Weight Range:  Vital Signs:   Temp:  [97.8 F (36.6 C)-98.7 F (37.1 C)] 98.1 F (36.7 C) (10/14 0400) Pulse Rate:  [73-101] 85 (10/14 0700) Resp:  [13-26] 15 (10/14 0700) BP: (91-200)/(33-96) 143/50 mmHg (10/14 0700) SpO2:  [99 %-100 %] 100 % (10/14 0700) FiO2 (%):  [40 %] 40 % (10/14 0700) Weight:  [251 lb 5.2 oz (114 kg)] 251 lb 5.2 oz (114 kg) (10/14 0347) Last BM Date: 06/15/14  Weight change: Filed Weights   06/22/14 0403 06/23/14 0500 06/24/14 0347  Weight: 282 lb 3 oz (128 kg) 282 lb 3 oz (128 kg) 251 lb 5.2 oz (114 kg)    Intake/Output:   Intake/Output Summary (Last 24 hours) at 06/24/14 0708 Last data filed at 06/24/14 0630  Gross per 24 hour  Intake 1157.45 ml  Output   6699 ml  Net -5541.55 ml     Physical Exam: CVP ~15 General: Intubated. Awake following commands HEENT: normal. + ETT Neck: supple. LIJ TLC. JVP  jaw . Carotids 2+ bilat; no bruits. No lymphadenopathy or thryomegaly appreciated. RIJ  Cor: PMI nondisplaced. Regular rate & rhythm. No rubs, gallops or murmurs. Lungs: clear Abdomen: obese, nontender, +distended. No hepatosplenomegaly. No bruits or masses. Good bowel sounds. Rash under pannus  Extremities: no cyanosis, clubbing, rash, R and LLE 2+ woody edema  Neuro: intubated awake follows commands  Telemetry: SR 80-90s   Labs: Basic  Metabolic Panel:  Recent Labs Lab 06/21/14 0500 06/22/14 0604 06/23/14 0500 06/23/14 1600 06/24/14 0420  NA 137 140 143 142 140  K 3.6* 3.8 3.0* 3.1* 3.3*  CL 85* 86* 87* 89* 94*  CO2 41* 43* 43* 42* 35*  GLUCOSE 163* 176* 160* 132* 204*  BUN 100* 101* 102* 92* 62*  CREATININE 2.87* 2.93* 2.85* 2.55* 1.76*  CALCIUM 9.0 9.3 9.5 9.5 9.0  MG  --   --   --   --  2.1  PHOS  --   --   --  4.3  --     Liver Function Tests:  Recent Labs Lab 06/23/14 1600 06/24/14 0420  AST  --  15  ALT  --  7  ALKPHOS  --  101  BILITOT  --  1.2  PROT  --  7.6  ALBUMIN 3.2* 3.1*   No results found for this basename: LIPASE, AMYLASE,  in the last 168 hours No results found for this basename: AMMONIA,  in the last 168 hours  CBC:  Recent Labs Lab 06/20/14 0433 06/21/14 0500 06/22/14 0604 06/23/14 0400 06/23/14 0500 06/24/14 0420  WBC 8.3 8.5 9.1 18.4* 13.3* 15.4*  NEUTROABS 6.7 6.7 7.6 13.8* 11.8*  --   HGB 8.3* 8.5* 8.2* 11.5* 8.7* 8.9*  HCT 26.5* 26.9* 26.4* 36.2 27.9* 28.4*  MCV 93.0 95.1  96.4 94.3 94.3 95.3  PLT 176 177 185 384 201 195    Cardiac Enzymes: No results found for this basename: CKTOTAL, CKMB, CKMBINDEX, TROPONINI,  in the last 168 hours  BNP: BNP (last 3 results)  Recent Labs  09/10/13 1827 10/13/13 1916 06/09/14 1818  PROBNP 4050.0* 1519.0* 4358.0*     Other results:    Imaging: Dg Chest Port 1 View  06/23/2014   CLINICAL DATA:  Dialysis catheter placement.  Pulmonary edema.  EXAM: PORTABLE CHEST - 1 VIEW  COMPARISON:  06/23/2014 at 4:53 a.m. and 06/22/2014  FINDINGS: Endotracheal tube, left central catheter, NG tube, and new double lumen central venous catheter all appear in good position. The tip of the dialysis catheter is in the superior vena cava above the cavoatrial junction. No pneumothorax.  Pulmonary edema has improved. Heart size and pulmonary vascularity are normal.  Old healed fracture of the proximal left humerus. No acute osseous  abnormality.  IMPRESSION: New dialysis catheter appears in good position. Improved pulmonary edema.   Electronically Signed   By: Geanie Cooley M.D.   On: 06/23/2014 12:17   Dg Chest Port 1 View  06/23/2014   CLINICAL DATA:  Assess endotracheal tube.  EXAM: PORTABLE CHEST - 1 VIEW  COMPARISON:  06/22/2014.  FINDINGS: Endotracheal tube ends at the clavicular heads. Gastric suction tube continues to/below the diaphragm. Unchanged positioning of left IJ catheter, tip at the distal left brachiocephalic vein.  Diffuse interstitial and airspace opacity is unchanged. There may be layering pleural effusions. No pneumothorax.  No cardiomegaly. Stable aortic contours, distorted from rightward rotation.  IMPRESSION: 1. Unchanged positioning of tubes and central line. 2. Stable pulmonary edema.   Electronically Signed   By: Tiburcio Pea M.D.   On: 06/23/2014 05:08   Dg Chest Port 1 View  06/22/2014   CLINICAL DATA:  Endotracheal tube and orogastric tube. Evaluate position  EXAM: PORTABLE CHEST - 1 VIEW  COMPARISON:  Radiograph 06/17/2014  FINDINGS: Endotracheal tube is positioned with tip 3.5 cm from carinal. NG tube extends in the course of the esophagus with tip below the inferior margin of the film. Left central venous line with tip in the mid brachiocephalic vein.  Normal cardiac silhouette. There is increased in perihilar vascular congestion and mild pulmonary edema. Bibasilar atelectasis similar. No pneumothorax  IMPRESSION: 1. Endotracheal tube and NG tube appear in good position. 2. Increase in pulmonary edema pattern. 3. Stable basilar atelectasis.   Electronically Signed   By: Genevive Bi M.D.   On: 06/22/2014 18:44   Dg Abd Portable 1v  06/22/2014   CLINICAL DATA:  Evaluate OG tube.  EXAM: PORTABLE ABDOMEN - 1 VIEW  COMPARISON:  None.  FINDINGS: The examination is markedly degraded secondary to patient body habitus.  Enteric tube tip and side port projects over the expected location of the gastric  antrum given obliquity.  Paucity of bowel gas without definite evidence of obstruction. Nondiagnostic evaluation for pneumoperitoneum secondary supine positioning patient body habitus. No definite pneumatosis or portal venous gas.  No definite abnormal intra-abdominal calcifications.  IMPRESSION: Degraded examination demonstrates enteric tube tip and side port overlying expected location of the gastric antrum.   Electronically Signed   By: Simonne Come M.D.   On: 06/22/2014 18:42     Medications:     Scheduled Medications: . antiseptic oral rinse  7 mL Mouth Rinse QID  . Brinzolamide-Brimonidine  1 drop Both Eyes BID  . chlorhexidine  15 mL Mouth Rinse BID  .  famotidine  20 mg Per Tube QHS  . feeding supplement (PRO-STAT SUGAR FREE 64)  60 mL Per Tube BID  . feeding supplement (VITAL HIGH PROTEIN)  1,000 mL Per Tube Q24H  . insulin aspart  0-20 Units Subcutaneous 6 times per day  . insulin glargine  10 Units Subcutaneous QHS  . ipratropium-albuterol  3 mL Nebulization Q6H  . levothyroxine  50 mcg Per Tube QAC breakfast  . metoprolol tartrate  25 mg Per Tube BID  . nystatin   Topical TID  . sodium chloride  10-40 mL Intracatheter Q12H  . sodium chloride  10-40 mL Intracatheter Q12H    Infusions: . sodium chloride Stopped (06/23/14 1856)  . heparin 10,000 units/ 20 mL infusion syringe 950 Units/hr (06/24/14 0500)  . dialysis replacement fluid (prismasate) 500 mL/hr at 06/24/14 0054  . dialysis replacement fluid (prismasate) 300 mL/hr at 06/23/14 1429  . dialysate (PRISMASATE) 1,500 mL/hr at 06/24/14 0434    PRN Medications: albuterol, fentaNYL, heparin, heparin, heparin, ondansetron, sorbitol   Assessment:  1. A/C Diastolic HF ECHO EF 55-60%  2. Mitral Valve Stenosis 3. Obesity 4. DM --> Hgb A1C 7 5. Cardio Renal Syndrome 6. Hypothyroidism --> TSH 18.36  7. Immobility  8. Pulmonary venous HTN  9. Acute hypercarbic resp failure -> intubated 10/12 10. AKI, on CVVHD 11.  Hypokalemia  Plan/Discussion:    Yesterday (10/13) placed RIJ swan and trialysis catheter. She was started on CVVHD and has had -5.5 liters off all night. SBP stable and hemodynamics improving. She continues to be volume overloaded will continue CVVHD. Will supplement K+.   Agree that [facility] likely not be good [facility]   Aundria Rudosgrove, Ali B, NP-C 7:08 AM  Advanced Heart Failure Team Pager [phone] (M-F; 7a - 4p)  Please contact [facility] for night-coverage after hours (4p -7a ) and weekends on [email or web address]   Patient seen and examined with Ulla PotashAli Cosgrove, NP. We discussed all aspects of the encounter. I agree with the assessment and plan as stated above.   See my comments in other note on chart for today.  Ester Mabe,MD 8:41 AM

## 2014-06-24 NOTE — Progress Notes (Signed)
Patient ID: Kimberly [name], female   DOB: February 25, 1956, 58 y.o.   MRN: [id number] S:no complaints, much more awake/alert O:BP 140/47  Pulse 86  Temp(Src) 98.3 F (36.8 C) (Oral)  Resp 19  Ht 5\' 4"  (1.626 m)  Wt 114 kg (251 lb 5.2 oz)  BMI 43.12 kg/m2  SpO2 100%  Intake/Output Summary (Last 24 hours) at 06/24/14 0842 Last data filed at 06/24/14 0800  Gross per 24 hour  Intake 1161.65 ml  Output   6967 ml  Net -5805.35 ml   Intake/Output: I/O last 3 completed shifts: In: 1534.7 [I.V.:304.7; NG/GT:948; IV Piggyback:282] Out: 9322 [Urine:5970; Emesis/NG output:200; Other:3152]  Intake/Output this shift:  Total I/O In: 85 [NG/GT:85] Out: 245 [Urine:75; Other:170] Weight change: -14 kg (-30 lb 13.8 oz) Gen:WD obese WF intubated but awake and responsive CVS:no rub Resp:decreased BS WUJ:WJXBJAbd:obese, +edema YNW:GNFAOZExt:brawny edema bilaterally   Recent Labs Lab 06/19/14 0235 06/20/14 0433 06/21/14 0500 06/22/14 0604 06/23/14 0500 06/23/14 1600 06/24/14 0420  NA 142 139 137 140 143 142 140  K 3.5* 3.7 3.6* 3.8 3.0* 3.1* 3.3*  CL 89* 89* 85* 86* 87* 89* 94*  CO2 39* 42* 41* 43* 43* 42* 35*  GLUCOSE 39* 108* 163* 176* 160* 132* 204*  BUN 103* 102* 100* 101* 102* 92* 62*  CREATININE 2.97* 2.87* 2.87* 2.93* 2.85* 2.55* 1.76*  ALBUMIN  --   --   --   --   --  3.2* 3.1*  CALCIUM 9.2 9.0 9.0 9.3 9.5 9.5 9.0  PHOS  --   --   --   --   --  4.3  --   AST  --   --   --   --   --   --  15  ALT  --   --   --   --   --   --  7   Liver Function Tests:  Recent Labs Lab 06/23/14 1600 06/24/14 0420  AST  --  15  ALT  --  7  ALKPHOS  --  101  BILITOT  --  1.2  PROT  --  7.6  ALBUMIN 3.2* 3.1*   No results found for this basename: LIPASE, AMYLASE,  in the last 168 hours No results found for this basename: AMMONIA,  in the last 168 hours CBC:  Recent Labs Lab 06/21/14 0500 06/22/14 0604 06/23/14 0400 06/23/14 0500 06/24/14 0420  WBC 8.5 9.1 18.4* 13.3* 15.4*  NEUTROABS 6.7 7.6 13.8*  11.8*  --   HGB 8.5* 8.2* 11.5* 8.7* 8.9*  HCT 26.9* 26.4* 36.2 27.9* 28.4*  MCV 95.1 96.4 94.3 94.3 95.3  PLT 177 185 384 201 195   Cardiac Enzymes: No results found for this basename: CKTOTAL, CKMB, CKMBINDEX, TROPONINI,  in the last 168 hours CBG:  Recent Labs Lab 06/23/14 1626 06/23/14 1943 06/23/14 2355 06/24/14 0339 06/24/14 0726  GLUCAP 121* 127* 173* 185* 190*    Iron Studies: No results found for this basename: IRON, TIBC, TRANSFERRIN, FERRITIN,  in the last 72 hours Studies/Results: Dg Chest Port 1 View  06/24/2014   CLINICAL DATA:  Respiratory failure, endotracheal tube positioning.  EXAM: PORTABLE CHEST - 1 VIEW  COMPARISON:  06/23/2014  FINDINGS: Endotracheal tube tip 2.7 cm above the carina. Left IJ line tip: Brachycephalic vein. Right IJ line tip: Lower SVC.  Nasogastric tube enters the stomach.  Stable moderately enlarged cardiopericardial silhouette. Worsened bilateral interstitial and airspace opacity with indistinct pulmonary vasculature. Deformity from left proximal humeral  fracture noted.  IMPRESSION: 1. Worsened acute pulmonary edema. Tubes and lines unchanged imposition. ET tube satisfactorily positioned.   Electronically Signed   By: Herbie BaltimoreWalt  Liebkemann M.D.   On: 06/24/2014 07:54   Dg Chest Port 1 View  06/23/2014   CLINICAL DATA:  Dialysis catheter placement.  Pulmonary edema.  EXAM: PORTABLE CHEST - 1 VIEW  COMPARISON:  06/23/2014 at 4:53 a.m. and 06/22/2014  FINDINGS: Endotracheal tube, left central catheter, NG tube, and new double lumen central venous catheter all appear in good position. The tip of the dialysis catheter is in the superior vena cava above the cavoatrial junction. No pneumothorax.  Pulmonary edema has improved. Heart size and pulmonary vascularity are normal.  Old healed fracture of the proximal left humerus. No acute osseous abnormality.  IMPRESSION: New dialysis catheter appears in good position. Improved pulmonary edema.   Electronically  Signed   By: Geanie CooleyJim  Maxwell M.D.   On: 06/23/2014 12:17   Dg Chest Port 1 View  06/23/2014   CLINICAL DATA:  Assess endotracheal tube.  EXAM: PORTABLE CHEST - 1 VIEW  COMPARISON:  06/22/2014.  FINDINGS: Endotracheal tube ends at the clavicular heads. Gastric suction tube continues to/below the diaphragm. Unchanged positioning of left IJ catheter, tip at the distal left brachiocephalic vein.  Diffuse interstitial and airspace opacity is unchanged. There may be layering pleural effusions. No pneumothorax.  No cardiomegaly. Stable aortic contours, distorted from rightward rotation.  IMPRESSION: 1. Unchanged positioning of tubes and central line. 2. Stable pulmonary edema.   Electronically Signed   By: Tiburcio PeaJonathan  Watts M.D.   On: 06/23/2014 05:08   Dg Chest Port 1 View  06/22/2014   CLINICAL DATA:  Endotracheal tube and orogastric tube. Evaluate position  EXAM: PORTABLE CHEST - 1 VIEW  COMPARISON:  Radiograph 06/17/2014  FINDINGS: Endotracheal tube is positioned with tip 3.5 cm from carinal. NG tube extends in the course of the esophagus with tip below the inferior margin of the film. Left central venous line with tip in the mid brachiocephalic vein.  Normal cardiac silhouette. There is increased in perihilar vascular congestion and mild pulmonary edema. Bibasilar atelectasis similar. No pneumothorax  IMPRESSION: 1. Endotracheal tube and NG tube appear in good position. 2. Increase in pulmonary edema pattern. 3. Stable basilar atelectasis.   Electronically Signed   By: Genevive BiStewart  Edmunds M.D.   On: 06/22/2014 18:44   Dg Abd Portable 1v  06/22/2014   CLINICAL DATA:  Evaluate OG tube.  EXAM: PORTABLE ABDOMEN - 1 VIEW  COMPARISON:  None.  FINDINGS: The examination is markedly degraded secondary to patient body habitus.  Enteric tube tip and side port projects over the expected location of the gastric antrum given obliquity.  Paucity of bowel gas without definite evidence of obstruction. Nondiagnostic evaluation for  pneumoperitoneum secondary supine positioning patient body habitus. No definite pneumatosis or portal venous gas.  No definite abnormal intra-abdominal calcifications.  IMPRESSION: Degraded examination demonstrates enteric tube tip and side port overlying expected location of the gastric antrum.   Electronically Signed   By: Simonne ComeJohn  Watts M.D.   On: 06/22/2014 18:42   . antiseptic oral rinse  7 mL Mouth Rinse QID  . Brinzolamide-Brimonidine  1 drop Both Eyes BID  . chlorhexidine  15 mL Mouth Rinse BID  . famotidine  20 mg Per Tube QHS  . feeding supplement (PRO-STAT SUGAR FREE 64)  60 mL Per Tube BID  . feeding supplement (VITAL HIGH PROTEIN)  1,000 mL Per Tube Q24H  .  insulin aspart  0-20 Units Subcutaneous 6 times per day  . insulin glargine  10 Units Subcutaneous QHS  . ipratropium-albuterol  3 mL Nebulization Q6H  . levothyroxine  50 mcg Per Tube QAC breakfast  . metoprolol tartrate  25 mg Per Tube BID  . nystatin   Topical TID  . potassium chloride  40 mEq Per Tube Daily  . sodium chloride  10-40 mL Intracatheter Q12H  . sodium chloride  10-40 mL Intracatheter Q12H    BMET    Component Value Date/Time   NA 140 06/24/2014 0420   K 3.3* 06/24/2014 0420   CL 94* 06/24/2014 0420   CO2 35* 06/24/2014 0420   GLUCOSE 204* 06/24/2014 0420   BUN 62* 06/24/2014 0420   CREATININE 1.76* 06/24/2014 0420   CALCIUM 9.0 06/24/2014 0420   GFRNONAA 31* 06/24/2014 0420   GFRAA 35* 06/24/2014 0420   CBC    Component Value Date/Time   WBC 15.4* 06/24/2014 0420   RBC 2.98* 06/24/2014 0420   RBC 3.12* 05/10/2010 1512   HGB 8.9* 06/24/2014 0420   HCT 28.4* 06/24/2014 0420   PLT 195 06/24/2014 0420   MCV 95.3 06/24/2014 0420   MCH 29.9 06/24/2014 0420   MCHC 31.3 06/24/2014 0420   RDW 16.1* 06/24/2014 0420   LYMPHSABS 0.7 06/23/2014 0500   MONOABS 0.7 06/23/2014 0500   EOSABS 0.1 06/23/2014 0500   BASOSABS 0.0 06/23/2014 0500     Assessment/Plan:  1. AKI/CKD in setting of decompensated  diastolic CHF with azotemia consistent with cardiorenal syndrome. Pt responded well to boluses of IV Lasix and metolazone but had worsening azotemia and metabolic alkalosis leading to hypercapnic respiratory failure. May need to discussed EOL/QOL issues with family and pt (when she is more coherent) as she will require prolonged SNF placement and will not do well with dialysis long term.  1. Doing well with trial of CVVHD for volume removal, cont with UF(appreciate Dr. Prescott Gum assistance with HD cath).  2. Discontinued diuretics 2. Diastolic CHF- as above, off milrinone  1. Continue with a limited trial of CVVHDF. (she did UF 5L yesterday with significant improvement in electrolyte abnormalities including azotemia and metabolic alkalosis) 3. AMS- improved after intubation and initiation of CVVHD.  ABG revealed hypercapnea in pt with COPD, on oxygen and likely OSA due to morbid obesity.  1. Markedly improved, able to follow commands and write responses.  4. Anemia- normocytic. Possibly related to CKD or acute illness. Will check iron stores and start epo. 5. Severe pulmonary venous HTN- as above 6. Hypokalemia- cont to replete 7. VDRF- currently weaning, hopefully will be able to avoid trach as this would greatly complicate her longterm care. 8. DM- per primary svc 9. Metabolic alkalosis- improved with CVVHD.   10. Deconditioning- agree that she will require SNF at the minimum and possible LTC facility if she requires HD. 11. Dispo- multi-organ failure in a morbidly obese female. Prognosis guarded 12.   Ashelynn Marks A

## 2014-06-24 NOTE — Procedures (Signed)
Extubation Procedure Note  Patient Details:   Name: Kimberly [name] DOB: 1956-07-05 MRN: [id number]   Airway Documentation:    + cuff leak prior to extubation.  Evaluation  O2 sats: stable throughout Complications: No apparent complications Patient did tolerate procedure well. Bilateral Breath Sounds: Clear;Diminished Suctioning: Oral Yes, no stridor noted.  Pt denies SOB, no resp distress noted.   Jennette KettleBrowning, Kylii Ennis Joy 06/24/2014, 11:15 AM

## 2014-06-25 ENCOUNTER — Inpatient Hospital Stay (HOSPITAL_COMMUNITY): Payer: Managed Care, Other (non HMO)

## 2014-06-25 ENCOUNTER — Encounter (HOSPITAL_COMMUNITY): Payer: Self-pay | Admitting: Radiology

## 2014-06-25 DIAGNOSIS — R410 Disorientation, unspecified: Secondary | ICD-10-CM | POA: Diagnosis not present

## 2014-06-25 DIAGNOSIS — J9622 Acute and chronic respiratory failure with hypercapnia: Secondary | ICD-10-CM

## 2014-06-25 DIAGNOSIS — I509 Heart failure, unspecified: Secondary | ICD-10-CM

## 2014-06-25 LAB — RENAL FUNCTION PANEL
ALBUMIN: 3 g/dL — AB (ref 3.5–5.2)
ANION GAP: 10 (ref 5–15)
Albumin: 3.2 g/dL — ABNORMAL LOW (ref 3.5–5.2)
Anion gap: 11 (ref 5–15)
BUN: 37 mg/dL — AB (ref 6–23)
BUN: 27 mg/dL — AB (ref 6–23)
CALCIUM: 8.8 mg/dL (ref 8.4–10.5)
CHLORIDE: 99 meq/L (ref 96–112)
CO2: 29 mEq/L (ref 19–32)
CO2: 29 mEq/L (ref 19–32)
CREATININE: 1.19 mg/dL — AB (ref 0.50–1.10)
Calcium: 9.3 mg/dL (ref 8.4–10.5)
Chloride: 99 mEq/L (ref 96–112)
Creatinine, Ser: 1.03 mg/dL (ref 0.50–1.10)
GFR calc Af Amer: 57 mL/min — ABNORMAL LOW (ref >90)
GFR calc non Af Amer: 58 mL/min — ABNORMAL LOW (ref >90)
GFR, EST AFRICAN AMERICAN: 68 mL/min — AB (ref >90)
GFR, EST NON AFRICAN AMERICAN: 49 mL/min — AB (ref >90)
GLUCOSE: 149 mg/dL — AB (ref 70–99)
Glucose, Bld: 134 mg/dL — ABNORMAL HIGH (ref 70–99)
PHOSPHORUS: 2.4 mg/dL (ref 2.3–4.6)
PHOSPHORUS: 2.2 mg/dL — AB (ref 2.3–4.6)
Potassium: 3.7 mEq/L (ref 3.7–5.3)
Potassium: 4.7 mEq/L (ref 3.7–5.3)
SODIUM: 138 meq/L (ref 137–147)
Sodium: 139 mEq/L (ref 137–147)

## 2014-06-25 LAB — CARBOXYHEMOGLOBIN
CARBOXYHEMOGLOBIN: 1.6 % — AB (ref 0.5–1.5)
Methemoglobin: 0.8 % (ref 0.0–1.5)
O2 SAT: 84.4 %
Total hemoglobin: 8.1 g/dL — ABNORMAL LOW (ref 12.0–16.0)

## 2014-06-25 LAB — GLUCOSE, CAPILLARY
GLUCOSE-CAPILLARY: 132 mg/dL — AB (ref 70–99)
GLUCOSE-CAPILLARY: 129 mg/dL — AB (ref 70–99)
Glucose-Capillary: 213 mg/dL — ABNORMAL HIGH (ref 70–99)
Glucose-Capillary: 133 mg/dL — ABNORMAL HIGH (ref 70–99)
Glucose-Capillary: 131 mg/dL — ABNORMAL HIGH (ref 70–99)

## 2014-06-25 LAB — POCT ACTIVATED CLOTTING TIME
ACTIVATED CLOTTING TIME: 191 s
ACTIVATED CLOTTING TIME: 180 s
ACTIVATED CLOTTING TIME: 185 s
Activated Clotting Time: 191 seconds
Activated Clotting Time: 197 seconds
Activated Clotting Time: 202 seconds

## 2014-06-25 LAB — CBC
HEMATOCRIT: 28.3 % — AB (ref 36.0–46.0)
HEMOGLOBIN: 8.6 g/dL — AB (ref 12.0–15.0)
MCH: 29.6 pg (ref 26.0–34.0)
MCHC: 30.4 g/dL (ref 30.0–36.0)
MCV: 97.3 fL (ref 78.0–100.0)
Platelets: 198 10*3/uL (ref 150–400)
RBC: 2.91 MIL/uL — AB (ref 3.87–5.11)
RDW: 16.4 % — ABNORMAL HIGH (ref 11.5–15.5)
WBC: 14.3 10*3/uL — ABNORMAL HIGH (ref 4.0–10.5)

## 2014-06-25 LAB — CULTURE, RESPIRATORY

## 2014-06-25 LAB — APTT: aPTT: 68 seconds — ABNORMAL HIGH (ref 24–37)

## 2014-06-25 LAB — MAGNESIUM: Magnesium: 2.2 mg/dL (ref 1.5–2.5)

## 2014-06-25 LAB — CULTURE, RESPIRATORY W GRAM STAIN

## 2014-06-25 MED ORDER — HEPARIN SODIUM (PORCINE) 5000 UNIT/ML IJ SOLN
5000.0000 [IU] | Freq: Three times a day (TID) | INTRAMUSCULAR | Status: DC
  Administered 2014-06-25 – 2014-07-05 (×32): 5000 [IU] via SUBCUTANEOUS
  Filled 2014-06-25 (×34): qty 1

## 2014-06-25 NOTE — Progress Notes (Signed)
PULMONARY / CRITICAL CARE MEDICINE   Name: Kimberly ManilaSusan K Walthour MRN: [id number] DOB: 1956/01/15    ADMISSION DATE:  06/09/2014 CONSULTATION DATE:  06/25/2014  REFERRING MD :  Gwenlyn PerkingMadera  CHIEF COMPLAINT:  Acute on chronic hypercarbic respiratory failuer  INITIAL PRESENTATION: 58 y.o. F admitted 9/29 with anasarca, acute exacerbation of diastolic heart failure, AKI.  Since admission, has required lasix gtt and milrinone per recommendations of HF team - continues to have sluggish diuresis despite multiple meds.  On afternoon of 10/12, pt became more lethargic.  ABG demonstrated hypercarbic respiratory failure.  Pt placed on BiPAP but did not have any improvement.  PCCM consulted for  Intubation. Required CRRT for neg balance   STUDIES:  9/29 Renal US: kidneys and bladder not visualized secondary to body habitus. 9/30 Echo: EF 55-60%, Grade 2 diastolic dysfunction, MV mod stenosis, PA peak 77. 9/30 CT abd/pelvis: limited study due to body habitus, extensive edema throughout subcutaneous fat of abdomen, small right pleural effusion, mod abdominal pelvic ascites. 10/15 head CT -no acute findings,Remote infarct of the left occipital cortex   SIGNIFICANT EVENTS: 9/29 admit 9/30 renal consulted 10/3 renal signoff 10/05 HF team consulted, CVL placed for CVP's and Co-Ox's. 10/06 lasix gtt, milrinone started 10/10 renal re-consulted for sluggish diuresis, AKI. 10/12 PCCM consultation for progressive resp failure, intubated 10/13 in and out PA cath by Dr Gala RomneyBensimhon. Markedly elevated PCWP and PAPs. PAC removed and HD cath placed 10/13 CRRT initiated by Renal 10/14 negative > 5.5 liters over 24 hrs. Passed SBT. RASS 0, CAM-ICU negative. Extubated successfully. CRRT conitnued 10/15 episode of delerium / aphasia  DEVICES: L IJ CVL 10/07 >>  ETT 10/12 >> 10/14 10/13 R IJ HD cath >>   MICRO DATA: resp 10/12 >> oral flora  ANTIMICROBIALS:    SUBJECTIVE: Confused overnight RASS 0. + F/C  VITAL  SIGNS: Temp:  [97.4 F (36.3 C)-98.2 F (36.8 C)] 98 F (36.7 C) (10/15 0700) Pulse Rate:  [74-95] 92 (10/15 0800) Resp:  [10-25] 17 (10/15 0800) BP: (93-148)/(10-85) 140/40 mmHg (10/15 0800) SpO2:  [98 %-100 %] 100 % (10/15 0800) Weight:  [120 kg (264 lb 8.8 oz)] 120 kg (264 lb 8.8 oz) (10/15 0500) HEMODYNAMICS: CVP:  [6 mmHg-17 mmHg] 9 mmHg VENTILATOR SETTINGS:   INTAKE / OUTPUT: Intake/Output     10/14 0701 - 10/15 0700 10/15 0701 - 10/16 0700   I.V. (mL/kg)     NG/GT 535    IV Piggyback     Total Intake(mL/kg) 535 (4.5)    Urine (mL/kg/hr) 519 (0.2)    Emesis/NG output     Other 4347 (1.5) 162 (0.8)   Total Output 4866 162   Net -4331 -162        Stool Occurrence 2 x      PHYSICAL EXAMINATION: General: Morbidly obese, RASS 0, + F/C Neuro: No focal deficits , speech normal, pleasantly confused -oriented to time, place, person HEENT: [place]/AT, PERRL Cardiovascular: Reg, distant HS, no M noted Lungs: no wheezes Abdomen: obese, soft, +BS Ext: mod - 1+ symmetric pitting edema Skin: chronic venous stasis changes  LABS: I have reviewed all of today's lab results. Relevant abnormalities are discussed in the A/P section  CXR: CM, interstitial edema  ASSESSMENT / PLAN:  PULMONARY A: Acute on chronic hypercarbic respiratory failure COPD without wheezing Suspect OSA / OHS Pulm edema Severe PH.  PASP 77 mmHg by echo, 90 mmHg by RHC P:   Supplemental O2 to maintain SpO2 90-95%  CARDIOVASCULAR A:  Acute on chronic dCHF Transient hypotension post intubation, resolved Hx HTN, HLD, CAD, PVD P:  Hemodynamic mgmt per HF team  RENAL A:   AKI, nonoliguric P:   Monitor BMET intermittently Monitor I/Os Correct electrolytes as indicated CRRT per Renal Service -can we transition to int HD?  GASTROINTESTINAL A:   GI prophylaxis Nutrition P:   SUP: N/I post extubation dys 3 diet  HEMATOLOGIC A:   Anemia without acute blood loss P:  DVT px: Full dose  UFH Monitor CBC intermittently Transfuse per usual ICU guidelines Decide on whether long term anticoagulation indicated  INFECTIOUS A:  Possible aspiration during intubation No fever, minimal leukocytosis P:   Monitor off abx  ENDOCRINE A:   DM 2, adequately controlled Hypothyroidism  P:   Cont SSI Cont levothyroxine  NEUROLOGIC A:   Acute metabolic encephalopathy, resolved Delerium, related to ICU stay Anxiety d/o Diabetic Neuropathy P:   RASS goal: 0 Holding buspar, robaxin, morphine, oxycodone, lyrica -restart at some point.  TODAY'S SUMMARY: Neg 35L since admit (30 lbs) - can transition from CVVH Would like to mobilise but limited by CVVH     I have personally obtained a history, examined the patient, evaluated laboratory and imaging results, formulated the assessment and plan and placed orders. CRITICAL CARE: The patient is critically ill with multiple organ systems failure and requires high complexity decision making for assessment and support, frequent evaluation and titration of therapies, application of advanced monitoring technologies and extensive interpretation of multiple databases. Critical Care Time devoted to patient care services described in this note is 32 minutes.    Cyril Mourningakesh Caidynce Muzyka MD. Tonny BollmanFCCP. [facility] Pager [phone] If no response call [phone]    06/25/2014, 8:48 AM

## 2014-06-25 NOTE — Plan of Care (Signed)
Problem: Phase I Progression Outcomes Goal: Up in chair, BRP Outcome: Not Progressing Patient unable to be moved due to CRRT in process.  Goal: Voiding-avoid urinary catheter unless indicated Outcome: Not Progressing Urinary foley placed per protocol and at this time cannot remove due to need for accurate I/O's, and  urinary retention.

## 2014-06-25 NOTE — Progress Notes (Signed)
Patient ID: Kimberly ManilaSusan K Francisco, female   DOB: 1956-02-07, 58 y.o.   MRN: [id number] Advanced Heart Failure Rounding Note   Subjective:    58 y/o admitted with diastolic HD, anasarca and a/c renal failure.   Renal Ultrasound - inconclusive ECHO 06/10/14 EF 55-60% Grade II DD MV moderate stenosis. Peak PA pressure 77  Extubated, failed swallow screen. Remains on CVVHD, 24 hr I/O -4.1 liters. Overnight AMS and concern for stroke, CT of head insignificant. Up in chair this am.   (06/23/14) RA 21  PA 90/33 (53)  PCWP 31  Thermo CO/CI 5.6/2.4  PVR 4.0 WU  SVR 792  Admit BUN/CR 56/2.06   Objective:   Weight Range:  Vital Signs:   Temp:  [97.4 F (36.3 C)-98.2 F (36.8 C)] 97.9 F (36.6 C) (10/15 0400) Pulse Rate:  [74-95] 91 (10/15 0700) Resp:  [10-25] 21 (10/15 0700) BP: (93-148)/(10-85) 148/48 mmHg (10/15 0700) SpO2:  [98 %-100 %] 100 % (10/15 0700) FiO2 (%):  [40 %] 40 % (10/14 0820) Weight:  [264 lb 8.8 oz (120 kg)] 264 lb 8.8 oz (120 kg) (10/15 0500) Last BM Date: 06/15/14  Weight change: Filed Weights   06/23/14 0500 06/24/14 0347 06/25/14 0500  Weight: 282 lb 3 oz (128 kg) 251 lb 5.2 oz (114 kg) 264 lb 8.8 oz (120 kg)    Intake/Output:   Intake/Output Summary (Last 24 hours) at 06/25/14 0706 Last data filed at 06/25/14 0600  Gross per 24 hour  Intake    535 ml  Output   4671 ml  Net  -4136 ml     Physical Exam: CVP ~10 General: Chronically ill appearing. Awake following commands,  HEENT: normal. Neck: supple. LIJ TLC. JVP difficult to assess d/t body habitus but appears 11-12 . Carotids 2+ bilat; no bruits. No lymphadenopathy or thryomegaly appreciated. RIJ  Cor: PMI nondisplaced. Regular rate & rhythm. No rubs, gallops or murmurs. Lungs: clear Abdomen: obese, nontender, +distended. No hepatosplenomegaly. No bruits or masses. Good bowel sounds. Rash under pannus  Extremities: no cyanosis, clubbing, rash, R and LLE 1+ woody edema  Neuro:  awake follows  commands  Telemetry: SR 80-90s   Labs: Basic Metabolic Panel:  Recent Labs Lab 06/23/14 0500 06/23/14 1600 06/24/14 0420 06/24/14 1555 06/25/14 0315  NA 143 142 140 140 139  K 3.0* 3.1* 3.3* 4.1 3.7  CL 87* 89* 94* 98 99  CO2 43* 42* 35* 32 29  GLUCOSE 160* 132* 204* 177* 134*  BUN 102* 92* 62* 50* 37*  CREATININE 2.85* 2.55* 1.76* 1.34* 1.19*  CALCIUM 9.5 9.5 9.0 8.8 8.8  MG  --   --  2.1  --  2.2  PHOS  --  4.3  --  2.3 2.4    Liver Function Tests:  Recent Labs Lab 06/23/14 1600 06/24/14 0420 06/24/14 1555 06/25/14 0315  AST  --  15  --   --   ALT  --  7  --   --   ALKPHOS  --  101  --   --   BILITOT  --  1.2  --   --   PROT  --  7.6  --   --   ALBUMIN 3.2* 3.1* 3.0* 3.0*   No results found for this basename: LIPASE, AMYLASE,  in the last 168 hours No results found for this basename: AMMONIA,  in the last 168 hours  CBC:  Recent Labs Lab 06/20/14 0433 06/21/14 0500 06/22/14 0604 06/23/14 0400 06/23/14 0500  06/24/14 0420 06/25/14 0315  WBC 8.3 8.5 9.1 18.4* 13.3* 15.4* 14.3*  NEUTROABS 6.7 6.7 7.6 13.8* 11.8*  --   --   HGB 8.3* 8.5* 8.2* 11.5* 8.7* 8.9* 8.6*  HCT 26.5* 26.9* 26.4* 36.2 27.9* 28.4* 28.3*  MCV 93.0 95.1 96.4 94.3 94.3 95.3 97.3  PLT 176 177 185 384 201 195 198    Cardiac Enzymes: No results found for this basename: CKTOTAL, CKMB, CKMBINDEX, TROPONINI,  in the last 168 hours  BNP: BNP (last 3 results)  Recent Labs  09/10/13 1827 10/13/13 1916 06/09/14 1818  PROBNP 4050.0* 1519.0* 4358.0*     Other results:    Imaging: Ct Head Wo Contrast  06/25/2014   CLINICAL DATA:  Altered mental status. Aphasia. Evaluate for infarct. Initial encounter.  EXAM: CT HEAD WITHOUT CONTRAST  TECHNIQUE: Contiguous axial images were obtained from the base of the skull through the vertex without intravenous contrast.  COMPARISON:  08/22/2010.  FINDINGS: Skull and Sinuses:Chronic bilateral mastoid opacification. No evidence of erosion since  previous. Clear paranasal sinuses. No fracture or destructive process.  Orbits: No acute findings.  Bilateral cataract resection.  Brain: No evidence of acute abnormality, such as acute cortical infarction, hemorrhage, hydrocephalus, or mass lesion/mass effect.  There is patchy bilateral cerebral white matter disease, focal in the subcortical posterior left frontal region, which has progressed from 2011. There is a remote occipital pole infarct on the left which is small.  IMPRESSION: 1. No acute intracranial findings. 2. 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 3. Remote infarct of the left occipital cortex. 4. Chronic bilateral mastoid opacification.   Electronically Signed   By: Tiburcio Pea M.D.   On: 06/25/2014 02:19   Dg Chest Port 1 View  06/24/2014   CLINICAL DATA:  Respiratory failure, endotracheal tube positioning.  EXAM: PORTABLE CHEST - 1 VIEW  COMPARISON:  06/23/2014  FINDINGS: Endotracheal tube tip 2.7 cm above the carina. Left IJ line tip: Brachycephalic vein. Right IJ line tip: Lower SVC.  Nasogastric tube enters the stomach.  Stable moderately enlarged cardiopericardial silhouette. Worsened bilateral interstitial and airspace opacity with indistinct pulmonary vasculature. Deformity from left proximal humeral fracture noted.  IMPRESSION: 1. Worsened acute pulmonary edema. Tubes and lines unchanged imposition. ET tube satisfactorily positioned.   Electronically Signed   By: Herbie Baltimore M.D.   On: 06/24/2014 07:54   Dg Chest Port 1 View  06/23/2014   CLINICAL DATA:  Dialysis catheter placement.  Pulmonary edema.  EXAM: PORTABLE CHEST - 1 VIEW  COMPARISON:  06/23/2014 at 4:53 a.m. and 06/22/2014  FINDINGS: Endotracheal tube, left central catheter, NG tube, and new double lumen central venous catheter all appear in good position. The tip of the dialysis catheter is in the superior vena cava above the cavoatrial junction. No pneumothorax.  Pulmonary edema has improved. Heart  size and pulmonary vascularity are normal.  Old healed fracture of the proximal left humerus. No acute osseous abnormality.  IMPRESSION: New dialysis catheter appears in good position. Improved pulmonary edema.   Electronically Signed   By: Geanie Cooley M.D.   On: 06/23/2014 12:17     Medications:     Scheduled Medications: . antiseptic oral rinse  7 mL Mouth Rinse QID  . Brinzolamide-Brimonidine  1 drop Both Eyes BID  . chlorhexidine  15 mL Mouth Rinse BID  . feeding supplement (PRO-STAT SUGAR FREE 64)  60 mL Per Tube BID  . insulin aspart  0-15 Units Subcutaneous 6 times per day  .  insulin glargine  10 Units Subcutaneous QHS  . ipratropium-albuterol  3 mL Nebulization Q6H  . levothyroxine  50 mcg Oral QAC breakfast  . metoprolol succinate  50 mg Oral Daily  . nystatin   Topical TID  . potassium chloride  40 mEq Oral Daily  . sodium chloride  10-40 mL Intracatheter Q12H  . sodium chloride  10-40 mL Intracatheter Q12H    Infusions: . sodium chloride Stopped (06/23/14 1856)  . heparin 10,000 units/ 20 mL infusion syringe 1,050 Units/hr (06/25/14 0700)  . dialysis replacement fluid (prismasate) 500 mL/hr at 06/25/14 0226  . dialysis replacement fluid (prismasate) 300 mL/hr at 06/25/14 0226  . dialysate (PRISMASATE) 1,500 mL/hr at 06/25/14 0602    PRN Medications: albuterol, fentaNYL, heparin, heparin, heparin, ondansetron, sorbitol   Assessment:  1. A/C Diastolic HF ECHO EF 55-60%  2. Mitral Valve Stenosis 3. Obesity 4. DM --> Hgb A1C 7 5. Cardio Renal Syndrome 6. Hypothyroidism --> TSH 18.36  7. Immobility  8. Pulmonary venous HTN  9. Acute hypercarbic resp failure - 10. AKI, on CVVHD 11. Hypokalemia 12. AMS - CT head (10/15) no stroke 13. Anemia 14. Deconditioning   Plan/Discussion:    Extubated yesterday (10/15). Remains on CVVHD and 24 hr I/O -4.1 liters. CVP remains elevated 10. Appreciate renals help.   Agree that she may need to have palliative care get  involved to discuss EOL/QOL issues. Not sure that she will tolerate HD [facility]. She has severe deconditioning.  Aundria Rud.   Cosgrove, Ali B, NP-C 7:06 AM  Advanced Heart Failure Team Pager [phone] (M-F; 7a - 4p)  Please contact [facility] for night-coverage after hours (4p -7a ) and weekends on [email or web address]  Patient seen and examined with Ulla PotashAli Cosgrove, NP. We discussed all aspects of the encounter. I agree with the assessment and plan as stated above.    Patient seen and examined with Ulla PotashAli Cosgrove, NP. We discussed all aspects of the encounter. I agree with the assessment and plan as stated above.   Volume status and mental status much improved with CVVHD. Renal managing volume status. Will continue. Main question seems to be if she will need and/or be candidate for long-term HD to maintain volume status.   Daniel Bensimhon,MD 10:36 AM

## 2014-06-25 NOTE — Progress Notes (Signed)
CSW (Clinical Child psychotherapistocial Worker) continues to follow pt case. [facility]. Facility has obtained insurance authorization. However, authorization expires tomorrow 06/26/2014. Facility will resubmit when more aware of potential dc date.  Sherilynn Dieu, LCSWA [phone]

## 2014-06-25 NOTE — Progress Notes (Signed)
Patient ID: Kimberly [name], female   DOB: [birth year], 58 y.o.   MRN: [id number] S:awake alert and oriented x 3 today, only complaint is "I'm cold" O:BP 151/53  Pulse 96  Temp(Src) 98 F (36.7 C) (Oral)  Resp 13  Ht 5\' 4"  (1.626 m)  Wt 120 kg (264 lb 8.8 oz)  BMI 45.39 kg/m2  SpO2 100%  Intake/Output Summary (Last 24 hours) at 06/25/14 0922 Last data filed at 06/25/14 0909  Gross per 24 hour  Intake     80 ml  Output   4691 ml  Net  -4611 ml   Intake/Output: I/O last 3 completed shifts: In: 1330 [NG/GT:1230; IV Piggyback:100] Out: 8339 [Urine:1589; Other:6750]  Intake/Output this shift:  Total I/O In: -  Out: 362 [Other:362] Weight change: 6 kg (13 lb 3.6 oz) Gen:WD obese WF in NAD CVS:no rub Resp:decreased BS at bases WUJ:WJXBJAbd:obese Ext:+anasarca   Recent Labs Lab 06/21/14 0500 06/22/14 0604 06/23/14 0500 06/23/14 1600 06/24/14 0420 06/24/14 1555 06/25/14 0315  NA 137 140 143 142 140 140 139  K 3.6* 3.8 3.0* 3.1* 3.3* 4.1 3.7  CL 85* 86* 87* 89* 94* 98 99  CO2 41* 43* 43* 42* 35* 32 29  GLUCOSE 163* 176* 160* 132* 204* 177* 134*  BUN 100* 101* 102* 92* 62* 50* 37*  CREATININE 2.87* 2.93* 2.85* 2.55* 1.76* 1.34* 1.19*  ALBUMIN  --   --   --  3.2* 3.1* 3.0* 3.0*  CALCIUM 9.0 9.3 9.5 9.5 9.0 8.8 8.8  PHOS  --   --   --  4.3  --  2.3 2.4  AST  --   --   --   --  15  --   --   ALT  --   --   --   --  7  --   --    Liver Function Tests:  Recent Labs Lab 06/24/14 0420 06/24/14 1555 06/25/14 0315  AST 15  --   --   ALT 7  --   --   ALKPHOS 101  --   --   BILITOT 1.2  --   --   PROT 7.6  --   --   ALBUMIN 3.1* 3.0* 3.0*   No results found for this basename: LIPASE, AMYLASE,  in the last 168 hours No results found for this basename: AMMONIA,  in the last 168 hours CBC:  Recent Labs Lab 06/22/14 0604 06/23/14 0400 06/23/14 0500 06/24/14 0420 06/25/14 0315  WBC 9.1 18.4* 13.3* 15.4* 14.3*  NEUTROABS 7.6 13.8* 11.8*  --   --   HGB 8.2* 11.5* 8.7* 8.9*  8.6*  HCT 26.4* 36.2 27.9* 28.4* 28.3*  MCV 96.4 94.3 94.3 95.3 97.3  PLT 185 384 201 195 198   Cardiac Enzymes: No results found for this basename: CKTOTAL, CKMB, CKMBINDEX, TROPONINI,  in the last 168 hours CBG:  Recent Labs Lab 06/24/14 0726 06/24/14 1613 06/24/14 1949 06/24/14 2334 06/25/14 0405  GLUCAP 190* 179* 142* 146* 132*    Iron Studies: No results found for this basename: IRON, TIBC, TRANSFERRIN, FERRITIN,  in the last 72 hours Studies/Results: Ct Head Wo Contrast  06/25/2014   CLINICAL DATA:  Altered mental status. Aphasia. Evaluate for infarct. Initial encounter.  EXAM: CT HEAD WITHOUT CONTRAST  TECHNIQUE: Contiguous axial images were obtained from the base of the skull through the vertex without intravenous contrast.  COMPARISON:  08/22/2010.  FINDINGS: Skull and Sinuses:Chronic bilateral mastoid opacification. No evidence of erosion  since previous. Clear paranasal sinuses. No fracture or destructive process.  Orbits: No acute findings.  Bilateral cataract resection.  Brain: No evidence of acute abnormality, such as acute cortical infarction, hemorrhage, hydrocephalus, or mass lesion/mass effect.  There is patchy bilateral cerebral white matter disease, focal in the subcortical posterior left frontal region, which has progressed from 2011. There is a remote occipital pole infarct on the left which is small.  IMPRESSION: 1. No acute intracranial findings. 2. 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 3. Remote infarct of the left occipital cortex. 4. Chronic bilateral mastoid opacification.   Electronically Signed   By: Tiburcio PeaJonathan  Watts M.D.   On: 06/25/2014 02:19   Dg Chest Port 1 View  06/24/2014   CLINICAL DATA:  Respiratory failure, endotracheal tube positioning.  EXAM: PORTABLE CHEST - 1 VIEW  COMPARISON:  06/23/2014  FINDINGS: Endotracheal tube tip 2.7 cm above the carina. Left IJ line tip: Brachycephalic vein. Right IJ line tip: Lower SVC.  Nasogastric tube  enters the stomach.  Stable moderately enlarged cardiopericardial silhouette. Worsened bilateral interstitial and airspace opacity with indistinct pulmonary vasculature. Deformity from left proximal humeral fracture noted.  IMPRESSION: 1. Worsened acute pulmonary edema. Tubes and lines unchanged imposition. ET tube satisfactorily positioned.   Electronically Signed   By: Herbie BaltimoreWalt  Liebkemann M.D.   On: 06/24/2014 07:54   Dg Chest Port 1 View  06/23/2014   CLINICAL DATA:  Dialysis catheter placement.  Pulmonary edema.  EXAM: PORTABLE CHEST - 1 VIEW  COMPARISON:  06/23/2014 at 4:53 a.m. and 06/22/2014  FINDINGS: Endotracheal tube, left central catheter, NG tube, and new double lumen central venous catheter all appear in good position. The tip of the dialysis catheter is in the superior vena cava above the cavoatrial junction. No pneumothorax.  Pulmonary edema has improved. Heart size and pulmonary vascularity are normal.  Old healed fracture of the proximal left humerus. No acute osseous abnormality.  IMPRESSION: New dialysis catheter appears in good position. Improved pulmonary edema.   Electronically Signed   By: Geanie CooleyJim  Maxwell M.D.   On: 06/23/2014 12:17   . antiseptic oral rinse  7 mL Mouth Rinse QID  . Brinzolamide-Brimonidine  1 drop Both Eyes BID  . chlorhexidine  15 mL Mouth Rinse BID  . feeding supplement (PRO-STAT SUGAR FREE 64)  60 mL Per Tube BID  . heparin subcutaneous  5,000 Units Subcutaneous 3 times per day  . insulin aspart  0-15 Units Subcutaneous 6 times per day  . insulin glargine  10 Units Subcutaneous QHS  . ipratropium-albuterol  3 mL Nebulization Q6H  . levothyroxine  50 mcg Oral QAC breakfast  . metoprolol succinate  50 mg Oral Daily  . nystatin   Topical TID  . potassium chloride  40 mEq Oral Daily  . sodium chloride  10-40 mL Intracatheter Q12H  . sodium chloride  10-40 mL Intracatheter Q12H    BMET    Component Value Date/Time   NA 139 06/25/2014 0315   K 3.7 06/25/2014  0315   CL 99 06/25/2014 0315   CO2 29 06/25/2014 0315   GLUCOSE 134* 06/25/2014 0315   BUN 37* 06/25/2014 0315   CREATININE 1.19* 06/25/2014 0315   CALCIUM 8.8 06/25/2014 0315   GFRNONAA 49* 06/25/2014 0315   GFRAA 57* 06/25/2014 0315   CBC    Component Value Date/Time   WBC 14.3* 06/25/2014 0315   RBC 2.91* 06/25/2014 0315   RBC 3.12* 05/10/2010 1512   HGB 8.6* 06/25/2014 0315  HCT 28.3* 06/25/2014 0315   PLT 198 06/25/2014 0315   MCV 97.3 06/25/2014 0315   MCH 29.6 06/25/2014 0315   MCHC 30.4 06/25/2014 0315   RDW 16.4* 06/25/2014 0315   LYMPHSABS 0.7 06/23/2014 0500   MONOABS 0.7 06/23/2014 0500   EOSABS 0.1 06/23/2014 0500   BASOSABS 0.0 06/23/2014 0500     Assessment/Plan:  1. AKI/CKD in setting of decompensated diastolic CHF with azotemia consistent with cardiorenal syndrome. Pt responded well to boluses of IV Lasix and metolazone but had worsening azotemia and metabolic alkalosis leading to hypercapnic respiratory failure. May need to discussed EOL/QOL issues with family and pt (when she is more coherent) as she will require prolonged SNF placement and will not do well with dialysis long term.  1. Doing well with trial of CVVHD for volume removal, cont with UF(appreciate Dr. Prescott Gum assistance with HD cath).  2. Discontinued diuretics 3. Will cont with CVVHD for another 24 hours and will likely transition to IHD tomorrow/over the weekend. 4. Cont to monitor UOP, hopefully as cardiac status improves, her renal function may improve. 2. Diastolic CHF- as above, off milrinone  1. Continue with a limited trial of CVVHDF. (she did UF 10L over last 48 hours with significant improvement in electrolyte abnormalities including azotemia and metabolic alkalosis) 3. AMS- improved after intubation and initiation of CVVHD. ABG revealed hypercapnea in pt with COPD, on oxygen and likely OSA due to morbid obesity.  1. Markedly improved, able to follow commands and write responses.   4. Anemia- normocytic. Possibly related to CKD or acute illness. Will check iron stores and start epo. 5. Severe pulmonary venous HTN- as above 6. Hypokalemia- cont to replete 7. Hypercapnic resp arrest- now extubated and doing well 8. DM- per primary svc 9. Metabolic alkalosis- improved with CVVHD.  10. Deconditioning- agree that she will require SNF at the minimum and possible LTC facility if she requires HD. 11. Dispo- multi-organ failure in a morbidly obese female. Prognosis guarded 12.   Lonisha Bobby A

## 2014-06-25 NOTE — Progress Notes (Addendum)
PCCM Interval Note  Asked to eval Ms Kimberly [name] for altered MS, possible aphasia, concern for subtle findings of CVA.  She has a strange affect, answers all questions appropriately without word finding difficulty, but then also tells a confabulated and convoluted story describing an earlier episode of confusion. No focal findings on my exam. The eval is most consistent with a mild delirium. She denies benzo or EtOH use. She does take lyrica at home, not on currently. At this point would defer code stroke. I agree with head CT scan, will do portable if possible since on CVVHD  Filed Vitals:   06/24/14 2000 06/24/14 2100 06/24/14 2200 06/25/14 0000  BP: 110/35 120/32 93/23 106/21  Pulse: 85 86 83 85  Temp: 97.5 F (36.4 C)   97.4 F (36.3 C)  TempSrc: Oral   Oral  Resp: 22 12 15 10   Height:      Weight:      SpO2: 99% 100% 100% 100%    Recent Labs Lab 06/22/14 0604 06/23/14 0500 06/23/14 1600 06/24/14 0420 06/24/14 1555  NA 140 143 142 140 140  K 3.8 3.0* 3.1* 3.3* 4.1  CL 86* 87* 89* 94* 98  CO2 43* 43* 42* 35* 32  GLUCOSE 176* 160* 132* 204* 177*  BUN 101* 102* 92* 62* 50*  CREATININE 2.93* 2.85* 2.55* 1.76* 1.34*  CALCIUM 9.3 9.5 9.5 9.0 8.8  MG  --   --   --  2.1  --   PHOS  --   --  4.3  --  2.3   20 minutes CC time  Kimberly Pupaobert Byrum, MD, PhD 06/25/2014, 1:27 AM [facility] [phone] or if no answer [phone]

## 2014-06-25 NOTE — Progress Notes (Signed)
eLink Physician-Brief Progress Note Patient Name: Kimberly ManilaSusan K [name] DOB: [birth year] MRN: [id number]   Date of Service  06/25/2014  HPI/Events of Note  RN describes possible expressive aphasia and bizarre speech - distinct change from earlier in day  eICU Interventions  STAT CT head. Dr Delton CoombesByrum to evaluate and decide on whether code stroke is indicated     Intervention Category Major Interventions: Change in mental status - evaluation and management  Billy FischerDavid Simonds 06/25/2014, 1:09 AM

## 2014-06-25 NOTE — Evaluation (Signed)
Clinical/Bedside Swallow Evaluation Patient Details  Name: Kimberly ManilaSusan K [name] MRN: [id number] Date of Birth: [birth year]  Today's Date: 06/25/2014 Time: [id number] SLP Time Calculation (min): 13 min  Past Medical History:  Past Medical History  Diagnosis Date  . HYPERLIPIDEMIA   . HYPERTENSION   . CAD, NATIVE VESSEL     May 10, 2010 cath showed a hyperdynamic LV function, she had dominant circumflex anatomy with a 70-80% small OM1. She had diffuse diabetic plaque particularly in the distal LAD. She nondominant RCA.  Nondominant  . PVD     CEA  . DM   . COPD   . Edema   . Stroke   . CHF (congestive heart failure)     Preserved EF  . Complication of anesthesia     DIFFICULT WAKING   . Cellulitis 10/15/2013    BILATERAL  . Chronic kidney disease   . Diabetic neuropathy   . Anasarca 05/2014   Past Surgical History:  Past Surgical History  Procedure Laterality Date  . Eye surgery    . Carotid endarterectomy     HPI:  58 y.o. year old female with significant past medical history of morbid obesity, diastolic dysfunction, IDDM, CAD, stage 3 CKD, chronic resp failure admitted with anasarca, diastolic CHF exacerbation, AKI.  CXR Worsened acute pulmonary edema. Tubes and lines unchanged imposition.  Intubated 10/12-10/14.  Decreased mental status and CT no acute abnormalities, remote occipital infarct.  During BSE 06/12/14, pt. 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  MBS 2011, results unknown.   Assessment / Plan / Recommendation Clinical Impression  [name] confused requiring min verbal cues.  Delayed coughs (inconsistently) midway through eval after water and cracker.  Pt. has a history of dysphagia and given current intubation x 2 days and decreased endurance/strength, recommend Dys 3 texture and nectar liquids. ST will continue to follow and upgrade diet/liquids when safe.     Aspiration Risk  Moderate    Diet Recommendation Dysphagia 3 (Mechanical Soft);Nectar-thick liquid   Liquid Administration via: Cup;No straw Medication Administration: Whole meds with puree Supervision: Patient able to self feed;Intermittent supervision to cue for compensatory strategies Compensations: Slow rate;Small sips/bites Postural Changes and/or Swallow Maneuvers: Seated upright 90 degrees    Other  Recommendations Oral Care Recommendations: Oral care BID   Follow Up Recommendations   (TBD)    Frequency and Duration min 2x/week  2 weeks   Pertinent Vitals/Pain No pain     Swallow Study           Oral/Motor/Sensory Function Overall Oral Motor/Sensory Function: Appears within functional limits for tasks assessed   Ice Chips Ice chips: Within functional limits   Thin Liquid Thin Liquid: Impaired Presentation: Cup Pharyngeal  Phase Impairments: Cough - Delayed    Nectar Thick Nectar Thick Liquid: Not tested   Honey Thick Honey Thick Liquid: Not tested   Puree Puree: Within functional limits   Solid   GO    Solid: Impaired Pharyngeal Phase Impairments: Cough - Delayed       Roque CashLitaker, Breck CoonsLisa Willis 06/25/2014,9:07 AM  Breck CoonsLisa Willis Lonell FaceLitaker M.Ed [employer] Pager [phone]

## 2014-06-26 DIAGNOSIS — R52 Pain, unspecified: Secondary | ICD-10-CM

## 2014-06-26 DIAGNOSIS — G9340 Encephalopathy, unspecified: Secondary | ICD-10-CM

## 2014-06-26 DIAGNOSIS — Z515 Encounter for palliative care: Secondary | ICD-10-CM

## 2014-06-26 DIAGNOSIS — R1013 Epigastric pain: Secondary | ICD-10-CM

## 2014-06-26 LAB — GLUCOSE, CAPILLARY
GLUCOSE-CAPILLARY: 141 mg/dL — AB (ref 70–99)
GLUCOSE-CAPILLARY: 192 mg/dL — AB (ref 70–99)
GLUCOSE-CAPILLARY: 185 mg/dL — AB (ref 70–99)
Glucose-Capillary: 120 mg/dL — ABNORMAL HIGH (ref 70–99)
Glucose-Capillary: 143 mg/dL — ABNORMAL HIGH (ref 70–99)
Glucose-Capillary: 181 mg/dL — ABNORMAL HIGH (ref 70–99)
Glucose-Capillary: 185 mg/dL — ABNORMAL HIGH (ref 70–99)

## 2014-06-26 LAB — RENAL FUNCTION PANEL
Albumin: 3.1 g/dL — ABNORMAL LOW (ref 3.5–5.2)
Anion gap: 12 (ref 5–15)
BUN: 23 mg/dL (ref 6–23)
CO2: 26 mEq/L (ref 19–32)
CREATININE: 1.07 mg/dL (ref 0.50–1.10)
Calcium: 9.1 mg/dL (ref 8.4–10.5)
Chloride: 101 mEq/L (ref 96–112)
GFR calc non Af Amer: 56 mL/min — ABNORMAL LOW (ref >90)
GFR, EST AFRICAN AMERICAN: 65 mL/min — AB (ref >90)
Glucose, Bld: 133 mg/dL — ABNORMAL HIGH (ref 70–99)
Phosphorus: 1.9 mg/dL — ABNORMAL LOW (ref 2.3–4.6)
Potassium: 4.4 mEq/L (ref 3.7–5.3)
Sodium: 139 mEq/L (ref 137–147)

## 2014-06-26 LAB — CBC
HEMATOCRIT: 28.5 % — AB (ref 36.0–46.0)
Hemoglobin: 8.7 g/dL — ABNORMAL LOW (ref 12.0–15.0)
MCH: 29.4 pg (ref 26.0–34.0)
MCHC: 30.5 g/dL (ref 30.0–36.0)
MCV: 96.3 fL (ref 78.0–100.0)
Platelets: 203 10*3/uL (ref 150–400)
RBC: 2.96 MIL/uL — AB (ref 3.87–5.11)
RDW: 16.4 % — ABNORMAL HIGH (ref 11.5–15.5)
WBC: 13.4 10*3/uL — AB (ref 4.0–10.5)

## 2014-06-26 LAB — MAGNESIUM: Magnesium: 2.4 mg/dL (ref 1.5–2.5)

## 2014-06-26 LAB — APTT: APTT: 86 s — AB (ref 24–37)

## 2014-06-26 LAB — POCT ACTIVATED CLOTTING TIME
Activated Clotting Time: 185 seconds
Activated Clotting Time: 191 seconds
Activated Clotting Time: 191 seconds

## 2014-06-26 MED ORDER — STARCH (THICKENING) PO POWD
ORAL | Status: DC | PRN
  Filled 2014-06-26 (×2): qty 227

## 2014-06-26 MED ORDER — ENSURE COMPLETE PO LIQD
237.0000 mL | Freq: Two times a day (BID) | ORAL | Status: DC
  Administered 2014-06-28 – 2014-06-30 (×4): 237 mL via ORAL

## 2014-06-26 MED ORDER — SIMETHICONE 80 MG PO CHEW
80.0000 mg | CHEWABLE_TABLET | Freq: Four times a day (QID) | ORAL | Status: DC | PRN
  Filled 2014-06-26 (×2): qty 1

## 2014-06-26 MED ORDER — POTASSIUM CHLORIDE 20 MEQ/15ML (10%) PO LIQD
40.0000 meq | Freq: Every day | ORAL | Status: DC
  Administered 2014-06-26: 40 meq via ORAL
  Filled 2014-06-26: qty 30

## 2014-06-26 MED ORDER — POTASSIUM CHLORIDE 20 MEQ/15ML (10%) PO LIQD
ORAL | Status: AC
  Filled 2014-06-26: qty 30

## 2014-06-26 MED ORDER — RESOURCE THICKENUP CLEAR PO POWD
ORAL | Status: DC | PRN
  Administered 2014-06-29: 13:00:00 via ORAL
  Filled 2014-06-26: qty 125

## 2014-06-26 MED ORDER — HEPARIN SODIUM (PORCINE) 1000 UNIT/ML DIALYSIS
1000.0000 [IU] | INTRAMUSCULAR | Status: DC | PRN
  Administered 2014-06-26: 2400 [IU] via INTRAVENOUS_CENTRAL
  Filled 2014-06-26: qty 3
  Filled 2014-06-26: qty 6

## 2014-06-26 NOTE — Progress Notes (Signed)
Patient ID: Galen ManilaSusan K Domingo, female   DOB: 01-04-1956, 58 y.o.   MRN: [id number] S:no complaints O:BP 157/53  Pulse 86  Temp(Src) 97.9 F (36.6 C) (Oral)  Resp 17  Ht 5\' 4"  (1.626 m)  Wt 120.7 kg (266 lb 1.5 oz)  BMI 45.65 kg/m2  SpO2 100%  Intake/Output Summary (Last 24 hours) at 06/26/14 0917 Last data filed at 06/26/14 0800  Gross per 24 hour  Intake     15 ml  Output   4077 ml  Net  -4062 ml   Intake/Output: I/O last 3 completed shifts: In: 15 [P.O.:15] Out: 6160 [Urine:425; Other:5735]  Intake/Output this shift:  Total I/O In: -  Out: 207 [Urine:5; Other:202] Weight change: 0.7 kg (1 lb 8.7 oz) Gen:WD obese WF sitting in chair in NAD ZHY:QMVHQCVS:faint HS Resp:decreased BS at bases ION:GEXBMAbd:obese +BS, +edema of panus WUX:LKGMWNExt:brawny edema (improving)   Recent Labs Lab 06/23/14 0500 06/23/14 1600 06/24/14 0420 06/24/14 1555 06/25/14 0315 06/25/14 1630 06/26/14 0400  NA 143 142 140 140 139 138 139  K 3.0* 3.1* 3.3* 4.1 3.7 4.7 4.4  CL 87* 89* 94* 98 99 99 101  CO2 43* 42* 35* 32 29 29 26   GLUCOSE 160* 132* 204* 177* 134* 149* 133*  BUN 102* 92* 62* 50* 37* 27* 23  CREATININE 2.85* 2.55* 1.76* 1.34* 1.19* 1.03 1.07  ALBUMIN  --  3.2* 3.1* 3.0* 3.0* 3.2* 3.1*  CALCIUM 9.5 9.5 9.0 8.8 8.8 9.3 9.1  PHOS  --  4.3  --  2.3 2.4 2.2* 1.9*  AST  --   --  15  --   --   --   --   ALT  --   --  7  --   --   --   --    Liver Function Tests:  Recent Labs Lab 06/24/14 0420  06/25/14 0315 06/25/14 1630 06/26/14 0400  AST 15  --   --   --   --   ALT 7  --   --   --   --   ALKPHOS 101  --   --   --   --   BILITOT 1.2  --   --   --   --   PROT 7.6  --   --   --   --   ALBUMIN 3.1*  < > 3.0* 3.2* 3.1*  < > = values in this interval not displayed. No results found for this basename: LIPASE, AMYLASE,  in the last 168 hours No results found for this basename: AMMONIA,  in the last 168 hours CBC:  Recent Labs Lab 06/22/14 0604 06/23/14 0400 06/23/14 0500 06/24/14 0420  06/25/14 0315 06/26/14 0400  WBC 9.1 18.4* 13.3* 15.4* 14.3* 13.4*  NEUTROABS 7.6 13.8* 11.8*  --   --   --   HGB 8.2* 11.5* 8.7* 8.9* 8.6* 8.7*  HCT 26.4* 36.2 27.9* 28.4* 28.3* 28.5*  MCV 96.4 94.3 94.3 95.3 97.3 96.3  PLT 185 384 201 195 198 203   Cardiac Enzymes: No results found for this basename: CKTOTAL, CKMB, CKMBINDEX, TROPONINI,  in the last 168 hours CBG:  Recent Labs Lab 06/25/14 1627 06/25/14 1937 06/25/14 2340 06/26/14 0338 06/26/14 0729  GLUCAP 131* 181* 143* 141* 120*    Iron Studies: No results found for this basename: IRON, TIBC, TRANSFERRIN, FERRITIN,  in the last 72 hours Studies/Results: Ct Head Wo Contrast  06/25/2014   CLINICAL DATA:  Altered mental status. Aphasia. Evaluate  for infarct. Initial encounter.  EXAM: CT HEAD WITHOUT CONTRAST  TECHNIQUE: Contiguous axial images were obtained from the base of the skull through the vertex without intravenous contrast.  COMPARISON:  08/22/2010.  FINDINGS: Skull and Sinuses:Chronic bilateral mastoid opacification. No evidence of erosion since previous. Clear paranasal sinuses. No fracture or destructive process.  Orbits: No acute findings.  Bilateral cataract resection.  Brain: No evidence of acute abnormality, such as acute cortical infarction, hemorrhage, hydrocephalus, or mass lesion/mass effect.  There is patchy bilateral cerebral white matter disease, focal in the subcortical posterior left frontal region, which has progressed from 2011. There is a remote occipital pole infarct on the left which is small.  IMPRESSION: 1. No acute intracranial findings. 2. 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 3. Remote infarct of the left occipital cortex. 4. Chronic bilateral mastoid opacification.   Electronically Signed   By: Tiburcio Pea M.D.   On: 06/25/2014 02:19   . Brinzolamide-Brimonidine  1 drop Both Eyes BID  . heparin subcutaneous  5,000 Units Subcutaneous 3 times per day  . insulin aspart  0-15  Units Subcutaneous 6 times per day  . insulin glargine  10 Units Subcutaneous QHS  . ipratropium-albuterol  3 mL Nebulization Q6H  . levothyroxine  50 mcg Oral QAC breakfast  . metoprolol succinate  50 mg Oral Daily  . nystatin   Topical TID  . potassium chloride  40 mEq Oral Daily  . sodium chloride  10-40 mL Intracatheter Q12H  . sodium chloride  10-40 mL Intracatheter Q12H    BMET    Component Value Date/Time   NA 139 06/26/2014 0400   K 4.4 06/26/2014 0400   CL 101 06/26/2014 0400   CO2 26 06/26/2014 0400   GLUCOSE 133* 06/26/2014 0400   BUN 23 06/26/2014 0400   CREATININE 1.07 06/26/2014 0400   CALCIUM 9.1 06/26/2014 0400   GFRNONAA 56* 06/26/2014 0400   GFRAA 65* 06/26/2014 0400   CBC    Component Value Date/Time   WBC 13.4* 06/26/2014 0400   RBC 2.96* 06/26/2014 0400   RBC 3.12* 05/10/2010 1512   HGB 8.7* 06/26/2014 0400   HCT 28.5* 06/26/2014 0400   PLT 203 06/26/2014 0400   MCV 96.3 06/26/2014 0400   MCH 29.4 06/26/2014 0400   MCHC 30.5 06/26/2014 0400   RDW 16.4* 06/26/2014 0400   LYMPHSABS 0.7 06/23/2014 0500   MONOABS 0.7 06/23/2014 0500   EOSABS 0.1 06/23/2014 0500   BASOSABS 0.0 06/23/2014 0500     Assessment/Plan:  1. AKI/CKD in setting of decompensated diastolic CHF with azotemia consistent with cardiorenal syndrome. Pt responded well to boluses of IV Lasix and metolazone but had worsening azotemia and metabolic alkalosis leading to hypercapnic respiratory failure. May need to discussed EOL/QOL issues with family and pt (when she is more coherent) as she will require prolonged SNF placement and will not do well with dialysis long term.  1. Has done well with trial of CVVHD for volume removal, will d/c today and transition to intermittent HD tomorrow.    2. Discontinued diuretics and may want to resume this weekend (after HD tomorrow) and follow UOP and Scr. 3. Cont to monitor UOP, hopefully as cardiac status improves, her renal function may  improve. 2. Diastolic CHF- as above, off milrinone  3. AMS- improved after intubation and initiation of CVVHD. ABG revealed hypercapnea in pt with COPD, on oxygen and likely OSA due to morbid obesity.  1. Markedly improved, able to follow  commands and write responses.  4. Anemia- normocytic. Possibly related to CKD or acute illness. Will check iron stores and start epo. 5. Severe pulmonary venous HTN- as above 6. Hypokalemia- cont to replete 7. Hypercapnic resp arrest- now extubated and doing well 8. DM- per primary svc 9. Metabolic alkalosis- improved with CVVHD.  10. Deconditioning- agree that she will require SNF at the minimum and possible LTC facility if she requires HD. 11. Dispo- multi-organ failure in a morbidly obese female. Prognosis guarded agree with PT/OT and mobilization now that she is off CVVHD 12.   Shem Plemmons A

## 2014-06-26 NOTE — Progress Notes (Signed)
PULMONARY / CRITICAL CARE MEDICINE   Name: Galen ManilaSusan K Elenes MRN: [id number] DOB: Nov 14, 1955    ADMISSION DATE:  06/09/2014 CONSULTATION DATE:  06/26/2014  REFERRING MD :  Gwenlyn PerkingMadera  CHIEF COMPLAINT:  Acute on chronic hypercarbic respiratory failuer  INITIAL PRESENTATION: 58 y.o. F admitted 9/29 with anasarca, acute exacerbation of diastolic heart failure, AKI.  Since admission, has required lasix gtt and milrinone per recommendations of HF team - continues to have sluggish diuresis despite multiple meds.  On afternoon of 10/12, pt became more lethargic.  ABG demonstrated hypercarbic respiratory failure.  Pt placed on BiPAP but did not have any improvement.  PCCM consulted for  Intubation. Required CRRT for neg balance   STUDIES:  9/29 Renal US: kidneys and bladder not visualized secondary to body habitus. 9/30 Echo: EF 55-60%, Grade 2 diastolic dysfunction, MV mod stenosis, PA peak 77. 9/30 CT abd/pelvis: limited study due to body habitus, extensive edema throughout subcutaneous fat of abdomen, small right pleural effusion, mod abdominal pelvic ascites. 10/15 head CT -no acute findings,Remote infarct of the left occipital cortex   SIGNIFICANT EVENTS: 9/29 admit 9/30 renal consulted 10/3 renal signoff 10/05 HF team consulted, CVL placed for CVP's and Co-Ox's. 10/06 lasix gtt, milrinone started 10/10 renal re-consulted for sluggish diuresis, AKI. 10/12 PCCM consultation for progressive resp failure, intubated 10/13 in and out PA cath by Dr Gala RomneyBensimhon. Markedly elevated PCWP and PAPs. PAC removed and HD cath placed 10/13 CRRT initiated by Renal 10/14 negative > 5.5 liters over 24 hrs. Passed SBT. RASS 0, CAM-ICU negative. Extubated successfully. CRRT conitnued 10/15 episode of delerium / aphasia  DEVICES: L IJ CVL 10/07 >>  ETT 10/12 >> 10/14 10/13 R IJ HD cath >>   MICRO DATA: resp 10/12 >> oral flora  ANTIMICROBIALS:    SUBJECTIVE: Less confusion Upset because she was given  potassium pill which she 'chokes on' RASS 0.  oob to chair, CRRT ongoing  VITAL SIGNS: Temp:  [97.5 F (36.4 C)-98.3 F (36.8 C)] 97.9 F (36.6 C) (10/16 0700) Pulse Rate:  [73-100] 87 (10/16 0900) Resp:  [7-27] 17 (10/16 0900) BP: (102-166)/(21-88) 166/68 mmHg (10/16 0900) SpO2:  [98 %-100 %] 100 % (10/16 0900) Weight:  [120.7 kg (266 lb 1.5 oz)] 120.7 kg (266 lb 1.5 oz) (10/16 0347) HEMODYNAMICS: CVP:  [5 mmHg-16 mmHg] 16 mmHg VENTILATOR SETTINGS:   INTAKE / OUTPUT: Intake/Output     10/15 0701 - 10/16 0700 10/16 0701 - 10/17 0700   P.O. 15 30   NG/GT     Total Intake(mL/kg) 15 (0.1) 30 (0.2)   Urine (mL/kg/hr) 315 (0.1) 5 (0)   Other 3977 (1.4) 202 (0.5)   Total Output 4292 207   Net -4277 -177        Stool Occurrence 1 x      PHYSICAL EXAMINATION: General: Morbidly obese, RASS 0, + F/C Neuro: No focal deficits , speech normal, pleasantly confused -oriented to time, place, person HEENT: [place]/AT, PERRL Cardiovascular: Reg, distant HS, no M noted Lungs: no wheezes Abdomen: obese, soft, +BS Ext: mod - 1+ symmetric pitting edema Skin: chronic venous stasis changes  LABS: I have reviewed all of today's lab results. Relevant abnormalities are discussed in the A/P section  CXR: CM, interstitial edema  ASSESSMENT / PLAN:  PULMONARY A: Acute on chronic hypercarbic respiratory failure COPD without wheezing Suspect OSA / OHS Pulm edema Severe PH.  PASP 77 mmHg by echo, 90 mmHg by RHC, PVR 4.0 WU P:   Supplemental O2  to maintain SpO2 90-95%  CARDIOVASCULAR A:  Acute on chronic dCHF Transient hypotension post intubation, resolved Hx HTN, HLD, CAD, PVD P:  Hemodynamic mgmt per HF team  RENAL A:   AKI, nonoliguric P:   Monitor BMET intermittently Monitor I/Os Correct electrolytes as indicated Dc CRRT per Renal  - transition to int HD & resume lasix after -unclear if she will need ngoing HD  GASTROINTESTINAL A:   GI prophylaxis Nutrition P:   SUP: N/I  post extubation dys 3 diet -crush pills  HEMATOLOGIC A:   Anemia without acute blood loss P:  DVT px: Full dose UFH Monitor CBC intermittently Transfuse per usual ICU guidelines Decide on whether long term anticoagulation indicated  INFECTIOUS A:  Possible aspiration during intubation No fever, minimal leukocytosis P:   Monitor off abx  ENDOCRINE A:   DM 2, adequately controlled Hypothyroidism  P:   Cont SSI Cont levothyroxine  NEUROLOGIC A:   Acute metabolic encephalopathy, resolved ICU Delerium -resolving Anxiety d/o Diabetic Neuropathy P:   RASS goal: 0 Holding buspar, robaxin, morphine, oxycodone, lyrica -restart at some point.  TODAY'S SUMMARY: Neg 35L since admit (30 lbs) - can transition to iHD, but will need to stay inpt until clear whether she will need ongoing HD or not. PT consult Detailed conversation with brother who understands guarded long term prognosis - will ask palliative care to have a goals of care discussion next week now that she is more lucid Can transfer to SDU   I have personally obtained a history, examined the patient, evaluated laboratory and imaging results, formulated the assessment and plan and placed orders. CRITICAL CARE: The patient is critically ill with multiple organ systems failure and requires high complexity decision making for assessment and support, frequent evaluation and titration of therapies, application of advanced monitoring technologies and extensive interpretation of multiple databases. Critical Care Time devoted to patient care services described in this note is 32 minutes.    Cyril Mourningakesh Alva MD. Tonny BollmanFCCP. [facility] Pager [phone] If no response call [phone]    06/26/2014, 10:34 AM

## 2014-06-26 NOTE — Progress Notes (Signed)
NUTRITION FOLLOW-UP  DOCUMENTATION CODES Per approved criteria  -Morbid Obesity   INTERVENTION: Ensure Complete po BID, each supplement provides 350 kcal and 13 grams of protein  NUTRITION DIAGNOSIS: Inadequate oral intake now related to confusion as evidenced by Meal Completion: <25%   Goal: Enteral nutrition to provide 60-70% of estimated calorie needs (22-25 kcals/kg ideal body weight) and 100% of estimated protein needs, based on ASPEN guidelines for permissive underfeeding in critically ill obese individuals; NA  New Goal:  Pt to meet >/= 90% of their estimated nutrition needs   Monitor:  Diet advancement, PO intake, supplement acceptance, HD needs, weight trends, labs  ASSESSMENT: Pt admitted on 9/29 with anasarca, exacerbation of diastolic heart failure and AKI. Pt has required lasix gtt and milrinone. Pt became more lethargic 10/12, failed BiPAP and intubated.  Pt started CVVHD 10/13 due to cardiorenal syndrome.   Pt extubated 10/14. Pt continued CVVHD for volume removal but plans to transition to IHD 10/17. Palliative care meeting planned as pt will need SNF and per Renal would not do well with HD long term.   Labs:  Potassium and Phosphorus low Cbg: 120-192  Height: Ht Readings from Last 1 Encounters:  06/10/14 5\' 4"  (1.626 m)    Weight: Wt Readings from Last 1 Encounters:  06/26/14 266 lb 1.5 oz (120.7 kg)  Pt negative 39.5 L  BMI:  Body mass index is 45.65 kg/(m^2).  Estimated Nutritional Needs: Kcal: 2062 Protein: >/= 136 grams Fluid: 1.5 L  Skin:  Skin tear, diabetic foot ulcer  Diet Order: Dysphagia 3 with Nectar Thickened Liquids PO intake 0 this am.   Intake/Output Summary (Last 24 hours) at 06/26/14 1332 Last data filed at 06/26/14 0900  Gross per 24 hour  Intake     45 ml  Output   3211 ml  Net  -3166 ml    Last BM: 10/15   Labs:   Recent Labs Lab 06/24/14 0420  06/25/14 0315 06/25/14 1630 06/26/14 0400  NA 140  < > 139 138  139  K 3.3*  < > 3.7 4.7 4.4  CL 94*  < > 99 99 101  CO2 35*  < > 29 29 26   BUN 62*  < > 37* 27* 23  CREATININE 1.76*  < > 1.19* 1.03 1.07  CALCIUM 9.0  < > 8.8 9.3 9.1  MG 2.1  --  2.2  --  2.4  PHOS  --   < > 2.4 2.2* 1.9*  GLUCOSE 204*  < > 134* 149* 133*  < > = values in this interval not displayed.  CBG (last 3)   Recent Labs  06/26/14 0338 06/26/14 0729 06/26/14 1132  GLUCAP 141* 120* 192*    Scheduled Meds: . Brinzolamide-Brimonidine  1 drop Both Eyes BID  . heparin subcutaneous  5,000 Units Subcutaneous 3 times per day  . insulin aspart  0-15 Units Subcutaneous 6 times per day  . insulin glargine  10 Units Subcutaneous QHS  . ipratropium-albuterol  3 mL Nebulization Q6H  . levothyroxine  50 mcg Oral QAC breakfast  . metoprolol succinate  50 mg Oral Daily  . nystatin   Topical TID  . sodium chloride  10-40 mL Intracatheter Q12H  . sodium chloride  10-40 mL Intracatheter Q12H    Continuous Infusions: . sodium chloride Stopped (06/23/14 1856)   Kendell BaneHeather Kenyette Gundy RD, LDN, CNSC [phone] Pager [phone] After Hours Pager

## 2014-06-26 NOTE — Progress Notes (Signed)
Patient ID: Galen ManilaSusan K Jobin, female   DOB: [birth year], 58 y.o.   MRN: [id number] Advanced Heart Failure Rounding Note   Subjective:     58 y/o admitted with diastolic HD, anasarca and a/c renal failure.   Renal Ultrasound - inconclusive ECHO 06/10/14 EF 55-60% Grade II DD MV moderate stenosis. Peak PA pressure 77   Remains on CVVHD, 24 hr I/O -4.3  liters. Weight seems inaccurate. More alert. Sitting in chair. Denies dyspnea. CVP 13 this am. (measured personally)   (06/23/14) RA 21  PA 90/33 (53)  PCWP 31  Thermo CO/CI 5.6/2.4  PVR 4.0 WU  SVR 792  Admit BUN/CR 56/2.06   Objective:   Weight Range:  Vital Signs:   Temp:  [97.5 F (36.4 C)-98.3 F (36.8 C)] 97.9 F (36.6 C) (10/16 0700) Pulse Rate:  [73-100] 86 (10/16 0800) Resp:  [7-27] 17 (10/16 0800) BP: (102-157)/(21-97) 157/53 mmHg (10/16 0800) SpO2:  [98 %-100 %] 100 % (10/16 0800) Weight:  [120.7 kg (266 lb 1.5 oz)] 120.7 kg (266 lb 1.5 oz) (10/16 0347) Last BM Date: 06/26/14  Weight change: Filed Weights   06/24/14 0347 06/25/14 0500 06/26/14 0347  Weight: 114 kg (251 lb 5.2 oz) 120 kg (264 lb 8.8 oz) 120.7 kg (266 lb 1.5 oz)    Intake/Output:   Intake/Output Summary (Last 24 hours) at 06/26/14 0836 Last data filed at 06/26/14 0800  Gross per 24 hour  Intake     15 ml  Output   4292 ml  Net  -4277 ml     Physical Exam: CVP ~13 General: Chronically ill appearing. Awake following commands,  HEENT: normal. Neck: supple. LIJ TLC. RIJ trialysis Carotids 2+ bilat; no bruits. No lymphadenopathy or thryomegaly appreciated. RIJ  Cor: PMI nondisplaced. Regular rate & rhythm. No rubs, gallops or murmurs. Lungs: clear Abdomen: obese, nontender, nondistended. No hepatosplenomegaly. No bruits or masses. Good bowel sounds. Extremities: no cyanosis, clubbing, rash, R and LLE 1+ woody edema  Neuro:  awake follows commands  Telemetry: SR 80-90s   Labs: Basic Metabolic Panel:  Recent Labs Lab 06/23/14 1600  06/24/14 0420 06/24/14 1555 06/25/14 0315 06/25/14 1630 06/26/14 0400  NA 142 140 140 139 138 139  K 3.1* 3.3* 4.1 3.7 4.7 4.4  CL 89* 94* 98 99 99 101  CO2 42* 35* 32 29 29 26   GLUCOSE 132* 204* 177* 134* 149* 133*  BUN 92* 62* 50* 37* 27* 23  CREATININE 2.55* 1.76* 1.34* 1.19* 1.03 1.07  CALCIUM 9.5 9.0 8.8 8.8 9.3 9.1  MG  --  2.1  --  2.2  --  2.4  PHOS 4.3  --  2.3 2.4 2.2* 1.9*    Liver Function Tests:  Recent Labs Lab 06/24/14 0420 06/24/14 1555 06/25/14 0315 06/25/14 1630 06/26/14 0400  AST 15  --   --   --   --   ALT 7  --   --   --   --   ALKPHOS 101  --   --   --   --   BILITOT 1.2  --   --   --   --   PROT 7.6  --   --   --   --   ALBUMIN 3.1* 3.0* 3.0* 3.2* 3.1*   No results found for this basename: LIPASE, AMYLASE,  in the last 168 hours No results found for this basename: AMMONIA,  in the last 168 hours  CBC:  Recent Labs Lab 06/20/14 0433 06/21/14  0500 06/22/14 0604 06/23/14 0400 06/23/14 0500 06/24/14 0420 06/25/14 0315 06/26/14 0400  WBC 8.3 8.5 9.1 18.4* 13.3* 15.4* 14.3* 13.4*  NEUTROABS 6.7 6.7 7.6 13.8* 11.8*  --   --   --   HGB 8.3* 8.5* 8.2* 11.5* 8.7* 8.9* 8.6* 8.7*  HCT 26.5* 26.9* 26.4* 36.2 27.9* 28.4* 28.3* 28.5*  MCV 93.0 95.1 96.4 94.3 94.3 95.3 97.3 96.3  PLT 176 177 185 384 201 195 198 203    Cardiac Enzymes: No results found for this basename: CKTOTAL, CKMB, CKMBINDEX, TROPONINI,  in the last 168 hours  BNP: BNP (last 3 results)  Recent Labs  09/10/13 1827 10/13/13 1916 06/09/14 1818  PROBNP 4050.0* 1519.0* 4358.0*     Other results:    Imaging: Ct Head Wo Contrast  06/25/2014   CLINICAL DATA:  Altered mental status. Aphasia. Evaluate for infarct. Initial encounter.  EXAM: CT HEAD WITHOUT CONTRAST  TECHNIQUE: Contiguous axial images were obtained from the base of the skull through the vertex without intravenous contrast.  COMPARISON:  08/22/2010.  FINDINGS: Skull and Sinuses:Chronic bilateral mastoid  opacification. No evidence of erosion since previous. Clear paranasal sinuses. No fracture or destructive process.  Orbits: No acute findings.  Bilateral cataract resection.  Brain: No evidence of acute abnormality, such as acute cortical infarction, hemorrhage, hydrocephalus, or mass lesion/mass effect.  There is patchy bilateral cerebral white matter disease, focal in the subcortical posterior left frontal region, which has progressed from 2011. There is a remote occipital pole infarct on the left which is small.  IMPRESSION: 1. No acute intracranial findings. 2. 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 3. Remote infarct of the left occipital cortex. 4. Chronic bilateral mastoid opacification.   Electronically Signed   By: Tiburcio PeaJonathan  Watts M.D.   On: 06/25/2014 02:19     Medications:     Scheduled Medications: . Brinzolamide-Brimonidine  1 drop Both Eyes BID  . heparin subcutaneous  5,000 Units Subcutaneous 3 times per day  . insulin aspart  0-15 Units Subcutaneous 6 times per day  . insulin glargine  10 Units Subcutaneous QHS  . ipratropium-albuterol  3 mL Nebulization Q6H  . levothyroxine  50 mcg Oral QAC breakfast  . metoprolol succinate  50 mg Oral Daily  . nystatin   Topical TID  . potassium chloride  40 mEq Oral Daily  . sodium chloride  10-40 mL Intracatheter Q12H  . sodium chloride  10-40 mL Intracatheter Q12H    Infusions: . sodium chloride Stopped (06/23/14 1856)  . heparin 10,000 units/ 20 mL infusion syringe 1,150 Units/hr (06/26/14 0800)  . dialysis replacement fluid (prismasate) 500 mL/hr at 06/25/14 2252  . dialysis replacement fluid (prismasate) 300 mL/hr at 06/25/14 2011  . dialysate (PRISMASATE) 1,500 mL/hr at 06/26/14 0547    PRN Medications: albuterol, fentaNYL, heparin, heparin, heparin, ondansetron, sorbitol   Assessment:  1. A/C Diastolic HF ECHO EF 55-60%  2. Mitral Valve Stenosis 3. Obesity 4. DM --> Hgb A1C 7 5. Cardio Renal Syndrome 6.  Hypothyroidism --> TSH 18.36  7. Immobility  8. Pulmonary venous HTN  9. Acute hypercarbic resp failure - 10. AKI, on CVVHD 11. Hypokalemia 12. AMS - CT head (10/15) no stroke 13. Anemia 14. Deconditioning   Plan/Discussion:     Remains on CVVHD and 24 hr I/O -4.2 liters. CVP remains elevated. Continue CVVHD. Renal managing volume status. Main question seems to be if she will need and/or be candidate for long-term HD to maintain volume status.  No new recs at this point. Will follow at a distance. Please call me with questions.   Agree that she may need to have palliative care get involved to discuss EOL/QOL issues. Not sure that she will tolerate HD unless she is at [facility]. She has severe deconditioning.  [name], MD 8:36 AM  Advanced Heart Failure Team Pager [phone] (M-F; 7a - 4p)  Please contact [facility] for night-coverage after hours (4p -7a ) and weekends on [email or web address]

## 2014-06-26 NOTE — Consult Note (Signed)
Patient ZO:XWRUE:Kimberly [name]      DOB: 1956-05-06      AVW:[id number]     Consult Note from the Palliative Medicine Team at [facility]    Consult Requested by: Dr Vassie LollAlva     PCP: Ginette OttoSTONEKING,[name] THOMAS, MD Reason for Consultation: Goals of Care    Phone Number:[phone]  Assessment/Recommendations: 58 yo female with multiple medical problems admitted with AKI anasarca consistent with cardiorenal syndrome in setting of severe Pulm HTN.   1.  Code Status: Full   2. Goals of Care: Kimberly PikesSusan is feeling much better today and actually able to recall a lot of conversations she has had with other physicians. She is able to express concerns related to her heart lung and kidneys and that she agrees that she is unlikely to ever be able to return home.  However, nursing home and long-term care do not seem like major issue to her because she has been struggling at home.  When I ask her whats most important to her, she worries about how her husband will do without her.   She is close to her brothers who she feels have at least a decent idea about her ongoing medical issues. I offered to talk with them as a group to ask questions and also to start thinking about how to handle issues she may face in the future, advance care planning. She is open to this, and I will check back with her on Monday to see if we can arrange this.     3. Symptom Management:   1. Pain- has PRN fentanyl. Suspect mostly low back pain from obesity, decreased mobility.  She is hesitant about pain meds. Can try non-opioid meds if more of an issue 2. Indigestion- will add PRN simethicone  4. Psychosocial/Spiritual: Was living at home with her husband Kimberly [name]. Strong faith background. Has 2 brothers who are deacons at their local church.  Very social person.  Feels like she can have good social interactions in nursing facility.    Brief HPI: Patient is a 58 yo female with PMHx of morbid obesity, diastolic dysfn, DM, CAD, CKD, PVD, chronic resp  failure chronically on 2l O2.  She presented with worsening edema of lower ext and abdomen and increased O2 requirement at home.  She was felt to be in CHF exacerbation and had AKI on admission on 9/29. She also reportedly had some urinary retnetion at home.  Initially she was able to diurese for several days but did develop worsening renal function.  She was seen by HF team and started on lasix gtt and eventually inotropic agent. On afternoon of 10/12 she became more lethargic and was noted to have hypercarbic resp failure with minimal response to BiPAP. She was ultimately intubated on 10/12 and CRRT was initiated on 10/13. With volume removal, she was able to be extubated on 10/14. On 10/15, she had worsening delirium and aphasia leading to head CT without central cause noted. There are concerns from multiple physicians regarding her long term prognosis and ability to do long term HD due to her co morbidities and severe deconditioning.  Palliative Care consulted today.  CCM has had some conversations about long term prognosis with brother. She was taken of CVVH today with plan for iHD tomorrow. She is more clear mentally, conversant and largely does not have any complaints.       PMH:  Past Medical History  Diagnosis Date  . HYPERLIPIDEMIA   . HYPERTENSION   . CAD,  NATIVE VESSEL     May 10, 2010 cath showed a hyperdynamic LV function, she had dominant circumflex anatomy with a 70-80% small OM1. She had diffuse diabetic plaque particularly in the distal LAD. She nondominant RCA.  Nondominant  . PVD     CEA  . DM   . COPD   . Edema   . Stroke   . CHF (congestive heart failure)     Preserved EF  . Complication of anesthesia     DIFFICULT WAKING   . Cellulitis 10/15/2013    BILATERAL  . Chronic kidney disease   . Diabetic neuropathy   . Anasarca 05/2014     PSH: Past Surgical History  Procedure Laterality Date  . Eye surgery    . Carotid endarterectomy     I have reviewed the FH  and SH and  If appropriate update it with new information. Allergies  Allergen Reactions  . Epinephrine     Increased heart rate   Scheduled Meds: . Brinzolamide-Brimonidine  1 drop Both Eyes BID  . feeding supplement (ENSURE COMPLETE)  237 mL Oral BID BM  . heparin subcutaneous  5,000 Units Subcutaneous 3 times per day  . insulin aspart  0-15 Units Subcutaneous 6 times per day  . insulin glargine  10 Units Subcutaneous QHS  . ipratropium-albuterol  3 mL Nebulization Q6H  . levothyroxine  50 mcg Oral QAC breakfast  . metoprolol succinate  50 mg Oral Daily  . nystatin   Topical TID  . sodium chloride  10-40 mL Intracatheter Q12H  . sodium chloride  10-40 mL Intracatheter Q12H   Continuous Infusions: . sodium chloride Stopped (06/23/14 1856)   PRN Meds:.albuterol, fentaNYL, ondansetron, sorbitol    BP 140/47  Pulse 82  Temp(Src) 98.3 F (36.8 C) (Oral)  Resp 14  Ht 5\' 4"  (1.626 m)  Wt 120.7 kg (266 lb 1.5 oz)  BMI 45.65 kg/m2  SpO2 100%   PPS: 50   Intake/Output Summary (Last 24 hours) at 06/26/14 1707 Last data filed at 06/26/14 1300  Gross per 24 hour  Intake     75 ml  Output   2394 ml  Net  -2319 ml     Physical Exam:  General: Alert, NAD HEENT:  [place], sclera anicteric Ext: mild edema Skin: warm/dry  Labs: CBC    Component Value Date/Time   WBC 13.4* 06/26/2014 0400   RBC 2.96* 06/26/2014 0400   RBC 3.12* 05/10/2010 1512   HGB 8.7* 06/26/2014 0400   HCT 28.5* 06/26/2014 0400   PLT 203 06/26/2014 0400   MCV 96.3 06/26/2014 0400   MCH 29.4 06/26/2014 0400   MCHC 30.5 06/26/2014 0400   RDW 16.4* 06/26/2014 0400   LYMPHSABS 0.7 06/23/2014 0500   MONOABS 0.7 06/23/2014 0500   EOSABS 0.1 06/23/2014 0500   BASOSABS 0.0 06/23/2014 0500    BMET    Component Value Date/Time   NA 139 06/26/2014 0400   K 4.4 06/26/2014 0400   CL 101 06/26/2014 0400   CO2 26 06/26/2014 0400   GLUCOSE 133* 06/26/2014 0400   BUN 23 06/26/2014 0400   CREATININE 1.07  06/26/2014 0400   CALCIUM 9.1 06/26/2014 0400   GFRNONAA 56* 06/26/2014 0400   GFRAA 65* 06/26/2014 0400    CMP     Component Value Date/Time   NA 139 06/26/2014 0400   K 4.4 06/26/2014 0400   CL 101 06/26/2014 0400   CO2 26 06/26/2014 0400   GLUCOSE 133* 06/26/2014  0400   BUN 23 06/26/2014 0400   CREATININE 1.07 06/26/2014 0400   CALCIUM 9.1 06/26/2014 0400   PROT 7.6 06/24/2014 0420   ALBUMIN 3.1* 06/26/2014 0400   AST 15 06/24/2014 0420   ALT 7 06/24/2014 0420   ALKPHOS 101 06/24/2014 0420   BILITOT 1.2 06/24/2014 0420   GFRNONAA 56* 06/26/2014 0400   GFRAA 65* 06/26/2014 0400   06/09/14 CT Abd/Pelvis IMPRESSION:  Technically limited study due to patient's body habitus. There is  extensive edema throughout the subcutaneous fat of the abdomen.  Small right pleural effusion. Moderate abdominal/pelvic ascites.   10/14 CXR IMPRESSION:  1. Worsened acute pulmonary edema. Tubes and lines unchanged  imposition. ET tube satisfactorily positioned.   10/15 CT Head IMPRESSION:  1. No acute intracranial findings.  2. 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  3. Remote infarct of the left occipital cortex.  4. Chronic bilateral mastoid opacification.    Total Time 90 minutes  Greater than 50%  of this time was spent counseling and coordinating care related to the above assessment and plan.   Orvis BrillAaron J. Valentine Barney D.O. Palliative Medicine Team at [facility]  Pager: [phone] Team Phone: [phone]

## 2014-06-26 NOTE — Evaluation (Signed)
Physical Therapy Evaluation Patient Details Name: Kimberly ManilaSusan K Hilburn MRN: [id number] DOB: 10-Dec-1955 Today's Date: 06/26/2014   History of Present Illness  Pt presents with Anasarca and SOB. Pt with progressive respiratory failure and was intubated on 10/12, extubated 10/14.Pt presents with Anasarca and SOB. Pt with progressive respiratory failure and was intubated on 10/12, extubated 10/14.  Clinical Impression  Pt admitted with the above. Pt currently with functional limitations due to the deficits listed below (see PT Problem List). At the time of PT eval pt was able to perform transfers with +2 max assist. Pt very anxious and tearful at times throughout session.  Pt will benefit from skilled PT to increase their independence and safety with mobility to allow discharge to the venue listed below. [facility].       Follow Up Recommendations SNF;Supervision for mobility/OOB    Equipment Recommendations  None recommended by PT    Recommendations for Other Services       Precautions / Restrictions Precautions Precautions: Fall Precaution Comments: O2 dependent at home.   Restrictions Weight Bearing Restrictions: No      Mobility  Bed Mobility Overal bed mobility: Needs Assistance;+2 for physical assistance Bed Mobility: Sit to Supine       Sit to supine: Max assist;+2 for physical assistance   General bed mobility comments: Pt able to transition to supine with assist for trunk control as well as LE elevation.   Transfers Overall transfer level: Needs assistance Equipment used: Rolling walker (2 wheeled) Transfers: Sit to/from [employer] CorporationStand;Squat Pivot Transfers Sit to Stand: Max assist;+2 physical assistance;+2 safety/equipment   Squat pivot transfers: Max assist;+2 physical assistance;+2 safety/equipment     General transfer comment: +2 assist with use of bed pad for support under hips. Unable to use the walker for UE support.    Ambulation/Gait             General Gait Details: Unable  Stairs            Wheelchair Mobility    Modified Rankin (Stroke Patients Only)       Balance Overall balance assessment: Needs assistance Sitting-balance support: Feet supported;No upper extremity supported Sitting balance-Leahy Scale: Poor Sitting balance - Comments: Able to hold static sitting independently for short bouts of time (~5 seconds) Postural control: Posterior lean Standing balance support: Bilateral upper extremity supported;During functional activity Standing balance-Leahy Scale: Zero                               Pertinent Vitals/Pain Pain Assessment: No/denies pain    Home Living Family/patient expects to be discharged to:: Private residence Living Arrangements: Spouse/significant other Available Help at Discharge: Family;Available PRN/intermittently Type of Home: House Home Access: Ramped entrance     Home Layout: One level Home Equipment: Walker - 2 wheels;Walker - 4 wheels;Bedside commode;Wheelchair - manual;Hospital bed Additional Comments: pt has been receiving HHPT, has only been able to transfer past 3wks, before that was ambulating about 10' at a time    Prior Function Level of Independence: Needs assistance   Gait / Transfers Assistance Needed: pt indicates only amb ~10' to 3-in-1, otherwise trasnfers.    ADL's / Homemaking Assistance Needed: husband assisted with bathing/dressing  Comments: uses RW at home, sleeps in hospital bed, uses BSC to conserve energy at home     Hand Dominance   Dominant Hand: Right    Extremity/Trunk Assessment   Upper  Extremity Assessment: Defer to OT evaluation           Lower Extremity Assessment: Generalized weakness      Cervical / Trunk Assessment: Kyphotic  Communication   Communication: No difficulties  Cognition Arousal/Alertness: Awake/alert Behavior During Therapy: Anxious Overall Cognitive Status:  Within Functional Limits for tasks assessed                      General Comments      Exercises General Exercises - Lower Extremity Long Arc Quad: 10 reps      Assessment/Plan    PT Assessment Patient needs continued PT services  PT Diagnosis Difficulty walking;Generalized weakness   PT Problem List Decreased strength;Decreased activity tolerance;Decreased balance;Decreased mobility;Decreased knowledge of use of DME;Cardiopulmonary status limiting activity  PT Treatment Interventions DME instruction;Gait training;Functional mobility training;Therapeutic activities;Therapeutic exercise;Balance training;Patient/family education   PT Goals (Current goals can be found in the Care Plan section) Acute Rehab PT Goals Patient Stated Goal: To get stronger - states she has been doing her HEP PT Goal Formulation: With patient Time For Goal Achievement: 07/03/14 Potential to Achieve Goals: Fair    Frequency Min 2X/week   Barriers to discharge Decreased caregiver support      Co-evaluation               End of Session Equipment Utilized During Treatment: Gait belt;Oxygen Activity Tolerance: Patient limited by fatigue Patient left: in bed;with call bell/phone within reach Nurse Communication: Mobility status;Need for lift equipment         Time: [id number] PT Time Calculation (min): 38 min   Charges:   PT Evaluation $Initial PT Evaluation Tier I: 1 Procedure PT Treatments $Gait Training: 8-22 mins $Therapeutic Activity: 8-22 mins   PT G Codes:          Conni SlipperKirkman, Saman Giddens 06/26/2014, 12:46 PM  Conni SlipperLaura Maverick Patman, PT, DPT Acute Rehabilitation Services Pager: [phone]

## 2014-06-27 DIAGNOSIS — R410 Disorientation, unspecified: Secondary | ICD-10-CM

## 2014-06-27 LAB — HEPATITIS B CORE ANTIBODY, TOTAL: Hep B Core Total Ab: NONREACTIVE

## 2014-06-27 LAB — GLUCOSE, CAPILLARY
GLUCOSE-CAPILLARY: 145 mg/dL — AB (ref 70–99)
Glucose-Capillary: 107 mg/dL — ABNORMAL HIGH (ref 70–99)
Glucose-Capillary: 128 mg/dL — ABNORMAL HIGH (ref 70–99)
Glucose-Capillary: 133 mg/dL — ABNORMAL HIGH (ref 70–99)
Glucose-Capillary: 149 mg/dL — ABNORMAL HIGH (ref 70–99)
Glucose-Capillary: 157 mg/dL — ABNORMAL HIGH (ref 70–99)

## 2014-06-27 LAB — HEPATITIS B SURFACE ANTIBODY,QUALITATIVE: Hep B S Ab: NEGATIVE

## 2014-06-27 LAB — RENAL FUNCTION PANEL
ALBUMIN: 3.4 g/dL — AB (ref 3.5–5.2)
Anion gap: 13 (ref 5–15)
BUN: 36 mg/dL — ABNORMAL HIGH (ref 6–23)
CALCIUM: 10 mg/dL (ref 8.4–10.5)
CHLORIDE: 100 meq/L (ref 96–112)
CO2: 24 mEq/L (ref 19–32)
CREATININE: 1.84 mg/dL — AB (ref 0.50–1.10)
GFR calc Af Amer: 34 mL/min — ABNORMAL LOW (ref >90)
GFR, EST NON AFRICAN AMERICAN: 29 mL/min — AB (ref >90)
Glucose, Bld: 138 mg/dL — ABNORMAL HIGH (ref 70–99)
Phosphorus: 2.3 mg/dL (ref 2.3–4.6)
Potassium: 4.7 mEq/L (ref 3.7–5.3)
Sodium: 137 mEq/L (ref 137–147)

## 2014-06-27 LAB — HEPATITIS B SURFACE ANTIGEN: HEP B S AG: NEGATIVE

## 2014-06-27 MED ORDER — NEPRO/CARBSTEADY PO LIQD
237.0000 mL | ORAL | Status: DC | PRN
  Filled 2014-06-27: qty 237

## 2014-06-27 MED ORDER — AMLODIPINE BESYLATE 5 MG PO TABS
5.0000 mg | ORAL_TABLET | Freq: Every day | ORAL | Status: DC
  Administered 2014-06-27 – 2014-07-04 (×8): 5 mg via ORAL
  Filled 2014-06-27 (×10): qty 1

## 2014-06-27 MED ORDER — IPRATROPIUM-ALBUTEROL 0.5-2.5 (3) MG/3ML IN SOLN: 3.0000 mL | RESPIRATORY_TRACT | Status: DC | PRN

## 2014-06-27 MED ORDER — SODIUM CHLORIDE 0.9 % IV SOLN: 100.0000 mL | INTRAVENOUS | Status: DC | PRN

## 2014-06-27 MED ORDER — HEPARIN SODIUM (PORCINE) 1000 UNIT/ML DIALYSIS
1000.0000 [IU] | INTRAMUSCULAR | Status: DC | PRN
  Filled 2014-06-27: qty 1

## 2014-06-27 MED ORDER — PENTAFLUOROPROP-TETRAFLUOROETH EX AERO
1.0000 | INHALATION_SPRAY | CUTANEOUS | Status: DC | PRN
Start: 2014-06-27 — End: 2014-07-05

## 2014-06-27 MED ORDER — ALTEPLASE 2 MG IJ SOLR
2.0000 mg | Freq: Once | INTRAMUSCULAR | Status: AC | PRN
Start: 2014-06-27 — End: 2014-06-27
  Filled 2014-06-27: qty 2

## 2014-06-27 MED ORDER — LIDOCAINE HCL (PF) 1 % IJ SOLN: 5.0000 mL | INTRAMUSCULAR | Status: DC | PRN

## 2014-06-27 MED ORDER — HEPARIN SODIUM (PORCINE) 1000 UNIT/ML DIALYSIS
20.0000 [IU]/kg | INTRAMUSCULAR | Status: DC | PRN
  Administered 2014-06-27: 2400 [IU] via INTRAVENOUS_CENTRAL
  Filled 2014-06-27: qty 3

## 2014-06-27 MED ORDER — LIDOCAINE-PRILOCAINE 2.5-2.5 % EX CREA
1.0000 "application " | TOPICAL_CREAM | CUTANEOUS | Status: DC | PRN
  Filled 2014-06-27: qty 5

## 2014-06-27 NOTE — Progress Notes (Signed)
PULMONARY / CRITICAL CARE MEDICINE   Name: Kimberly ManilaSusan K Maturin MRN: [id number] DOB: 1956-01-31    ADMISSION DATE:  06/09/2014 CONSULTATION DATE:  06/27/2014  REFERRING MD :  Gwenlyn PerkingMadera  CHIEF COMPLAINT:  Acute on chronic hypercarbic respiratory failuer  INITIAL PRESENTATION: 58 y.o. F admitted 9/29 with anasarca, acute exacerbation of diastolic heart failure, AKI.  Since admission, has required lasix gtt and milrinone per recommendations of HF team - continues to have sluggish diuresis despite multiple meds.  On afternoon of 10/12, pt became more lethargic.  ABG demonstrated hypercarbic respiratory failure.  Pt placed on BiPAP but did not have any improvement.  PCCM consulted for  Intubation. Required CRRT for neg balance   STUDIES:  9/29 Renal US: kidneys and bladder not visualized secondary to body habitus. 9/30 Echo: EF 55-60%, Grade 2 diastolic dysfunction, MV mod stenosis, PA peak 77. 9/30 CT abd/pelvis: limited study due to body habitus, extensive edema throughout subcutaneous fat of abdomen, small right pleural effusion, mod abdominal pelvic ascites. 10/15 head CT -no acute findings,Remote infarct of the left occipital cortex   SIGNIFICANT EVENTS: 9/29 admit 9/30 renal consulted 10/3 renal signoff 10/05 HF team consulted, CVL placed for CVP's and Co-Ox's. 10/06 lasix gtt, milrinone started 10/10 renal re-consulted for sluggish diuresis, AKI. 10/12 PCCM consultation for progressive resp failure, intubated 10/13 in and out PA cath by Dr Gala RomneyBensimhon. Markedly elevated PCWP and PAPs. PAC removed and HD cath placed 10/13 CRRT initiated by Renal 10/14 negative > 5.5 liters over 24 hrs. Passed SBT. RASS 0, CAM-ICU negative. Extubated successfully. CRRT conitnued 10/15 episode of delerium / aphasia  DEVICES: L IJ CVL 10/07 >>  ETT 10/12 >> 10/14 10/13 R IJ HD cath >>   MICRO DATA: resp 10/12 >> oral flora  ANTIMICROBIALS:    SUBJECTIVE:  Much improved  VITAL SIGNS: Temp:  [98 F  (36.7 C)-98.5 F (36.9 C)] 98.2 F (36.8 C) (10/17 0753) Pulse Rate:  [78-89] 85 (10/17 1135) Resp:  [12-28] 15 (10/17 1135) BP: (100-183)/(38-81) 114/59 mmHg (10/17 1135) SpO2:  [94 %-100 %] 97 % (10/17 0753) Weight:  [120.4 kg (265 lb 6.9 oz)-122.6 kg (270 lb 4.5 oz)] 122.6 kg (270 lb 4.5 oz) (10/17 0753) HEMODYNAMICS: CVP:  [12 mmHg-15 mmHg] 14 mmHg    INTAKE / OUTPUT: Intake/Output     10/16 0701 - 10/17 0700 10/17 0701 - 10/18 0700   P.O. 90    I.V. (mL/kg) 30 (0.2)    Total Intake(mL/kg) 120 (1)    Urine (mL/kg/hr) 185 (0.1)    Other 202 (0.1)    Total Output 387     Net -267          Stool Occurrence 1 x      PHYSICAL EXAMINATION: General: Morbidly obese, RASS 0, + F/C Neuro: No focal deficits , speech normal, pleasantly confused -oriented to time, place, person HEENT: /AT, PERRL Cardiovascular: Reg, distant HS, no M noted Lungs: no wheezes Abdomen: obese, soft, +BS Ext: mod - 1+ symmetric pitting edema Skin: chronic venous stasis changes  LABS: I have reviewed all of today's lab results. Relevant abnormalities are discussed in the A/P section  CXR: no film  ASSESSMENT / PLAN:  PULMONARY A: Acute on chronic hypercarbic respiratory failure resolved COPD without wheezing Suspect OSA / OHS Pulm edema Severe PH.  PASP 77 mmHg by echo, 90 mmHg by RHC, PVR 4.0 WU P:   Supplemental O2 to maintain SpO2 90-95%  CARDIOVASCULAR A:  Acute on chronic dCHF Transient  hypotension post intubation, resolved Hx HTN, HLD, CAD, PVD P:  Hemodynamic mgmt per HF team  RENAL A:   AKI, nonoliguric P:   Monitor BMET intermittently Monitor I/Os Correct electrolytes as indicated Dc CRRT per Renal  - transition to int HD   GASTROINTESTINAL A:   GI prophylaxis Nutrition P:   SUP: N/I post extubation dys 3 diet -crush pills  HEMATOLOGIC A:   Anemia without acute blood loss P:  DVT px: Full dose UFH Monitor CBC intermittently Transfuse per usual ICU  guidelines Decide on whether long term anticoagulation indicated  INFECTIOUS A:  Possible aspiration during intubation No fever, minimal leukocytosis P:   Monitor off abx  ENDOCRINE A:   DM 2, adequately controlled Hypothyroidism  P:   Cont SSI Cont levothyroxine  NEUROLOGIC A:   Acute metabolic encephalopathy, resolved ICU Delerium -resolving Anxiety d/o Diabetic Neuropathy P:   RASS goal: 0 Holding buspar, robaxin, morphine, oxycodone, lyrica -restart at some point.  TODAY'S SUMMARY: Neg 35L since admit (30 lbs) - can transition to iHD, but will need to stay inpt until clear whether she will need ongoing HD or not. PT consult Detailed conversation with brother who understands guarded long term prognosis - will ask palliative care to have a goals of care discussion next week now that she is more lucid Ok to tfr to tele 6E renal  I will ask TRH to assume care 10/18. Pccm will sign off   Caryl Bisatrick WrightMD Beeper  [phone]  Cell  [phone]  If no response or cell goes to voicemail, call beeper [phone]   06/27/2014, 11:51 AM

## 2014-06-27 NOTE — Progress Notes (Signed)
OT Cancellation Note  Patient Details Name: Kimberly [name] MRN: [id number] DOB: 1956-04-20   Cancelled Treatment:    Reason Eval/Treat Not Completed: Medical issues which prohibited therapy (HD)  Boone MasterJones, Trejon Duford B 06/27/2014, 8:53 AM Pager: [phone]

## 2014-06-27 NOTE — Progress Notes (Signed)
Pt transferred to room 6E16 from 2H. Pt alert and oriented.  Denies pain. Pleasant.  Foley in place. O2 @ 2 lpm via [place]. Turn Q2, Incont B&B.

## 2014-06-27 NOTE — Procedures (Signed)
Patient was seen on dialysis and the procedure was supervised. BFR 300 Via RIJ trialysis cath BP is 155/64.  Patient appears to be tolerating treatment well.  Will cont with IHD MWFSat for now and follow UOP.

## 2014-06-27 NOTE — Progress Notes (Addendum)
Patient ID: Kimberly [name], female   DOB: 01/04/1956, 58 y.o.   MRN: [id number] Advanced Heart Failure Rounding Note   Subjective:     58 y/o admitted with diastolic HD, anasarca and a/c renal failure.   Renal Ultrasound - inconclusive ECHO 06/10/14 EF 55-60% Grade II DD MV moderate stenosis. Peak PA pressure 77  Started intermittent HD today. Seems to be tolerating. Urine output near oliguric range. No dyspnea. BP remains high.    Objective:   Weight Range:  Vital Signs:   Temp:  [97.8 F (36.6 C)-98.5 F (36.9 C)] 98.2 F (36.8 C) (10/17 0753) Pulse Rate:  [77-89] 89 (10/17 0900) Resp:  [12-28] 19 (10/17 0900) BP: (102-183)/(30-81) 167/66 mmHg (10/17 0900) SpO2:  [94 %-100 %] 97 % (10/17 0753) Weight:  [120.4 kg (265 lb 6.9 oz)-122.6 kg (270 lb 4.5 oz)] 122.6 kg (270 lb 4.5 oz) (10/17 0753) Last BM Date: 06/26/14  Weight change: Filed Weights   06/26/14 0347 06/27/14 0403 06/27/14 0753  Weight: 120.7 kg (266 lb 1.5 oz) 120.4 kg (265 lb 6.9 oz) 122.6 kg (270 lb 4.5 oz)    Intake/Output:   Intake/Output Summary (Last 24 hours) at 06/27/14 0938 Last data filed at 06/27/14 0403  Gross per 24 hour  Intake     90 ml  Output    180 ml  Net    -90 ml     Physical Exam:  General: Chronically ill appearing. No distress HEENT: normal. Neck: supple. LIJ TLC. RIJ trialysis Carotids 2+ bilat; no bruits. No lymphadenopathy or thryomegaly appreciated. RIJ  Cor: PMI nondisplaced. Regular rate & rhythm. No rubs, gallops or murmurs. Lungs: clear Abdomen: obese, nontender, nondistended. No hepatosplenomegaly. No bruits or masses. Good bowel sounds. Extremities: no cyanosis, clubbing, rash, R and LLE 1+ woody edema  Neuro:  Pleasant. Nonfocal.   Telemetry: SR 80-90s   Labs: Basic Metabolic Panel:  Recent Labs Lab 06/24/14 0420 06/24/14 1555 06/25/14 0315 06/25/14 1630 06/26/14 0400 06/27/14 0500  NA 140 140 139 138 139 137  K 3.3* 4.1 3.7 4.7 4.4 4.7  CL 94* 98 99 99  101 100  CO2 35* 32 29 29 26 24   GLUCOSE 204* 177* 134* 149* 133* 138*  BUN 62* 50* 37* 27* 23 36*  CREATININE 1.76* 1.34* 1.19* 1.03 1.07 1.84*  CALCIUM 9.0 8.8 8.8 9.3 9.1 10.0  MG 2.1  --  2.2  --  2.4  --   PHOS  --  2.3 2.4 2.2* 1.9* 2.3    Liver Function Tests:  Recent Labs Lab 06/24/14 0420 06/24/14 1555 06/25/14 0315 06/25/14 1630 06/26/14 0400 06/27/14 0500  AST 15  --   --   --   --   --   ALT 7  --   --   --   --   --   ALKPHOS 101  --   --   --   --   --   BILITOT 1.2  --   --   --   --   --   PROT 7.6  --   --   --   --   --   ALBUMIN 3.1* 3.0* 3.0* 3.2* 3.1* 3.4*   No results found for this basename: LIPASE, AMYLASE,  in the last 168 hours No results found for this basename: AMMONIA,  in the last 168 hours  CBC:  Recent Labs Lab 06/21/14 0500 06/22/14 0604 06/23/14 0400 06/23/14 0500 06/24/14 0420 06/25/14 0315 06/26/14 0400  WBC 8.5 9.1 18.4* 13.3* 15.4* 14.3* 13.4*  NEUTROABS 6.7 7.6 13.8* 11.8*  --   --   --   HGB 8.5* 8.2* 11.5* 8.7* 8.9* 8.6* 8.7*  HCT 26.9* 26.4* 36.2 27.9* 28.4* 28.3* 28.5*  MCV 95.1 96.4 94.3 94.3 95.3 97.3 96.3  PLT 177 185 384 201 195 198 203    Cardiac Enzymes: No results found for this basename: CKTOTAL, CKMB, CKMBINDEX, TROPONINI,  in the last 168 hours  BNP: BNP (last 3 results)  Recent Labs  09/10/13 1827 10/13/13 1916 06/09/14 1818  PROBNP 4050.0* 1519.0* 4358.0*     Other results:    Imaging: No results found.   Medications:     Scheduled Medications: . Brinzolamide-Brimonidine  1 drop Both Eyes BID  . feeding supplement (ENSURE COMPLETE)  237 mL Oral BID BM  . heparin subcutaneous  5,000 Units Subcutaneous 3 times per day  . insulin aspart  0-15 Units Subcutaneous 6 times per day  . insulin glargine  10 Units Subcutaneous QHS  . ipratropium-albuterol  3 mL Nebulization Q6H  . levothyroxine  50 mcg Oral QAC breakfast  . metoprolol succinate  50 mg Oral Daily  . nystatin   Topical TID   . sodium chloride  10-40 mL Intracatheter Q12H  . sodium chloride  10-40 mL Intracatheter Q12H    Infusions: . sodium chloride Stopped (06/23/14 1856)    PRN Medications: sodium chloride, sodium chloride, albuterol, alteplase, feeding supplement (NEPRO CARB STEADY), fentaNYL, heparin, heparin, lidocaine (PF), lidocaine-prilocaine, ondansetron, pentafluoroprop-tetrafluoroeth, RESOURCE THICKENUP CLEAR, simethicone, sorbitol   Assessment:  1. A/C Diastolic HF ECHO EF 55-60%  2. Mitral Valve Stenosis 3. Obesity 4. DM --> Hgb A1C 7 5. Cardio Renal Syndrome 6. Hypothyroidism --> TSH 18.36  7. Immobility  8. Pulmonary venous HTN  9. Acute hypercarbic resp failure - 10. AKI, on CVVHD 11. Hypokalemia 12. AMS - CT head (10/15) no stroke 13. Anemia 14. Deconditioning   Plan/Discussion:     Now on intermittent HD. Suspect she will need long-term HD. Await Renal's thoughts.  Will addamlodipine for HTN. Will schedule for evening so as not to drop BP prior to HD. Renal can change as needed.    Will follow at a distance. Please call me with questions.   [name]. Daniel Bensimhon, MD 9:38 AM  Advanced Heart Failure Team Pager [phone] (M-F; 7a - 4p)  Please contact [facility] for night-coverage after hours (4p -7a ) and weekends on [email or web address]

## 2014-06-28 DIAGNOSIS — M545 Low back pain, unspecified: Secondary | ICD-10-CM | POA: Diagnosis present

## 2014-06-28 DIAGNOSIS — N19 Unspecified kidney failure: Secondary | ICD-10-CM

## 2014-06-28 DIAGNOSIS — R5381 Other malaise: Secondary | ICD-10-CM | POA: Diagnosis present

## 2014-06-28 DIAGNOSIS — I1310 Hypertensive heart and chronic kidney disease without heart failure, with stage 1 through stage 4 chronic kidney disease, or unspecified chronic kidney disease: Secondary | ICD-10-CM | POA: Diagnosis not present

## 2014-06-28 DIAGNOSIS — G8929 Other chronic pain: Secondary | ICD-10-CM | POA: Diagnosis present

## 2014-06-28 LAB — GLUCOSE, CAPILLARY
Glucose-Capillary: 134 mg/dL — ABNORMAL HIGH (ref 70–99)
Glucose-Capillary: 328 mg/dL — ABNORMAL HIGH (ref 70–99)
Glucose-Capillary: 196 mg/dL — ABNORMAL HIGH (ref 70–99)
Glucose-Capillary: 257 mg/dL — ABNORMAL HIGH (ref 70–99)

## 2014-06-28 LAB — RENAL FUNCTION PANEL
ANION GAP: 14 (ref 5–15)
Albumin: 3.3 g/dL — ABNORMAL LOW (ref 3.5–5.2)
BUN: 28 mg/dL — ABNORMAL HIGH (ref 6–23)
CALCIUM: 9.4 mg/dL (ref 8.4–10.5)
CO2: 25 mEq/L (ref 19–32)
Chloride: 98 mEq/L (ref 96–112)
Creatinine, Ser: 1.72 mg/dL — ABNORMAL HIGH (ref 0.50–1.10)
GFR calc Af Amer: 36 mL/min — ABNORMAL LOW (ref >90)
GFR, EST NON AFRICAN AMERICAN: 31 mL/min — AB (ref >90)
Glucose, Bld: 139 mg/dL — ABNORMAL HIGH (ref 70–99)
POTASSIUM: 4 meq/L (ref 3.7–5.3)
Phosphorus: 3.4 mg/dL (ref 2.3–4.6)
SODIUM: 137 meq/L (ref 137–147)

## 2014-06-28 MED ORDER — LIDOCAINE 5 % EX PTCH
1.0000 | MEDICATED_PATCH | CUTANEOUS | Status: DC
  Administered 2014-06-28 – 2014-07-05 (×8): 1 via TRANSDERMAL
  Filled 2014-06-28 (×8): qty 1

## 2014-06-28 MED ORDER — INSULIN ASPART 100 UNIT/ML ~~LOC~~ SOLN
0.0000 [IU] | Freq: Every day | SUBCUTANEOUS | Status: DC
  Administered 2014-06-28: 3 [IU] via SUBCUTANEOUS
  Administered 2014-06-29 – 2014-07-01 (×3): 4 [IU] via SUBCUTANEOUS
  Administered 2014-07-02: 3 [IU] via SUBCUTANEOUS
  Administered 2014-07-03: 4 [IU] via SUBCUTANEOUS
  Administered 2014-07-04: 2 [IU] via SUBCUTANEOUS

## 2014-06-28 MED ORDER — CYCLOBENZAPRINE HCL 10 MG PO TABS
5.0000 mg | ORAL_TABLET | Freq: Three times a day (TID) | ORAL | Status: DC | PRN
  Administered 2014-06-29 – 2014-07-01 (×2): 5 mg via ORAL
  Filled 2014-06-28 (×3): qty 1

## 2014-06-28 MED ORDER — INSULIN ASPART 100 UNIT/ML ~~LOC~~ SOLN
0.0000 [IU] | Freq: Three times a day (TID) | SUBCUTANEOUS | Status: DC
  Administered 2014-06-29: 3 [IU] via SUBCUTANEOUS
  Administered 2014-06-29: 5 [IU] via SUBCUTANEOUS
  Administered 2014-06-29: 11 [IU] via SUBCUTANEOUS
  Administered 2014-06-30 (×2): 8 [IU] via SUBCUTANEOUS
  Administered 2014-06-30 – 2014-07-01 (×2): 15 [IU] via SUBCUTANEOUS
  Administered 2014-07-01 (×2): 8 [IU] via SUBCUTANEOUS
  Administered 2014-07-02: 15 [IU] via SUBCUTANEOUS
  Administered 2014-07-02: 11 [IU] via SUBCUTANEOUS
  Administered 2014-07-02 – 2014-07-03 (×3): 15 [IU] via SUBCUTANEOUS
  Administered 2014-07-03: 11 [IU] via SUBCUTANEOUS
  Administered 2014-07-04 (×2): 15 [IU] via SUBCUTANEOUS
  Administered 2014-07-04: 5 [IU] via SUBCUTANEOUS
  Administered 2014-07-05 (×2): 3 [IU] via SUBCUTANEOUS

## 2014-06-28 MED ORDER — FUROSEMIDE 10 MG/ML IJ SOLN
120.0000 mg | Freq: Two times a day (BID) | INTRAVENOUS | Status: DC
  Administered 2014-06-28 – 2014-06-30 (×4): 120 mg via INTRAVENOUS
  Filled 2014-06-28 (×5): qty 12

## 2014-06-28 MED ORDER — SODIUM CHLORIDE 0.9 % IJ SOLN
10.0000 mL | INTRAMUSCULAR | Status: DC | PRN
  Administered 2014-06-28 (×2): 30 mL
  Administered 2014-06-29 (×4): 10 mL
  Administered 2014-06-29 – 2014-07-01 (×2): 30 mL
  Administered 2014-07-02 – 2014-07-03 (×3): 10 mL

## 2014-06-28 NOTE — Progress Notes (Signed)
Patient transferred from [facility] to 6E.  Will provide handoff report in the morning to Genelle BalVanessa Crawford, [name].  Current plan remains in place [facility] SN[facility] when medically stable per MD. This CSW will sign off.  Lorri Frederickonna T. Jaci LazierCrowder, [place]CS[place] [phone]

## 2014-06-28 NOTE — Progress Notes (Signed)
Patient ID: Kimberly ManilaSusan K [name], female   DOB: 1956-05-19, 58 y.o.   MRN: [id number] S:more awake and alert O:BP 158/60  Pulse 93  Temp(Src) 98.3 F (36.8 C) (Oral)  Resp 16  Ht 5\' 4"  (1.626 m)  Wt 119.1 kg (262 lb 9.1 oz)  BMI 45.05 kg/m2  SpO2 100%  Intake/Output Summary (Last 24 hours) at 06/28/14 1013 Last data filed at 06/28/14 0900  Gross per 24 hour  Intake    120 ml  Output   3645 ml  Net  -3525 ml   Intake/Output: I/O last 3 completed shifts: In: 60 [P.O.:30; I.V.:30] Out: 3795 [Urine:240; Other:3555]  Intake/Output this shift:  Total I/O In: 120 [P.O.:120] Out: -  Weight change: 2.2 kg (4 lb 13.6 oz) Gen:WD obese WF in NAd CVS:no rub Resp:cta QMV:HQIONAbd:obese Ext:+edema/anasarca (much improved)   Recent Labs Lab 06/23/14 1600 06/24/14 0420 06/24/14 1555 06/25/14 0315 06/25/14 1630 06/26/14 0400 06/27/14 0500 06/28/14 0506  NA 142 140 140 139 138 139 137 137  K 3.1* 3.3* 4.1 3.7 4.7 4.4 4.7 4.0  CL 89* 94* 98 99 99 101 100 98  CO2 42* 35* 32 29 29 26 24 25   GLUCOSE 132* 204* 177* 134* 149* 133* 138* 139*  BUN 92* 62* 50* 37* 27* 23 36* 28*  CREATININE 2.55* 1.76* 1.34* 1.19* 1.03 1.07 1.84* 1.72*  ALBUMIN 3.2* 3.1* 3.0* 3.0* 3.2* 3.1* 3.4* 3.3*  CALCIUM 9.5 9.0 8.8 8.8 9.3 9.1 10.0 9.4  PHOS 4.3  --  2.3 2.4 2.2* 1.9* 2.3 3.4  AST  --  15  --   --   --   --   --   --   ALT  --  7  --   --   --   --   --   --    Liver Function Tests:  Recent Labs Lab 06/24/14 0420  06/26/14 0400 06/27/14 0500 06/28/14 0506  AST 15  --   --   --   --   ALT 7  --   --   --   --   ALKPHOS 101  --   --   --   --   BILITOT 1.2  --   --   --   --   PROT 7.6  --   --   --   --   ALBUMIN 3.1*  < > 3.1* 3.4* 3.3*  < > = values in this interval not displayed. No results found for this basename: LIPASE, AMYLASE,  in the last 168 hours No results found for this basename: AMMONIA,  in the last 168 hours CBC:  Recent Labs Lab 06/22/14 0604 06/23/14 0400 06/23/14 0500  06/24/14 0420 06/25/14 0315 06/26/14 0400  WBC 9.1 18.4* 13.3* 15.4* 14.3* 13.4*  NEUTROABS 7.6 13.8* 11.8*  --   --   --   HGB 8.2* 11.5* 8.7* 8.9* 8.6* 8.7*  HCT 26.4* 36.2 27.9* 28.4* 28.3* 28.5*  MCV 96.4 94.3 94.3 95.3 97.3 96.3  PLT 185 384 201 195 198 203   Cardiac Enzymes: No results found for this basename: CKTOTAL, CKMB, CKMBINDEX, TROPONINI,  in the last 168 hours CBG:  Recent Labs Lab 06/27/14 0719 06/27/14 1227 06/27/14 1627 06/27/14 2111 06/28/14 0833  GLUCAP 128* 107* 133* 149* 134*    Iron Studies: No results found for this basename: IRON, TIBC, TRANSFERRIN, FERRITIN,  in the last 72 hours Studies/Results: No results found. Marland Kitchen. amLODipine  5 mg Oral Daily  .  Brinzolamide-Brimonidine  1 drop Both Eyes BID  . feeding supplement (ENSURE COMPLETE)  237 mL Oral BID BM  . heparin subcutaneous  5,000 Units Subcutaneous 3 times per day  . insulin aspart  0-15 Units Subcutaneous 6 times per day  . insulin glargine  10 Units Subcutaneous QHS  . levothyroxine  50 mcg Oral QAC breakfast  . metoprolol succinate  50 mg Oral Daily  . nystatin   Topical TID  . sodium chloride  10-40 mL Intracatheter Q12H  . sodium chloride  10-40 mL Intracatheter Q12H    BMET    Component Value Date/Time   NA 137 06/28/2014 0506   K 4.0 06/28/2014 0506   CL 98 06/28/2014 0506   CO2 25 06/28/2014 0506   GLUCOSE 139* 06/28/2014 0506   BUN 28* 06/28/2014 0506   CREATININE 1.72* 06/28/2014 0506   CALCIUM 9.4 06/28/2014 0506   GFRNONAA 31* 06/28/2014 0506   GFRAA 36* 06/28/2014 0506   CBC    Component Value Date/Time   WBC 13.4* 06/26/2014 0400   RBC 2.96* 06/26/2014 0400   RBC 3.12* 05/10/2010 1512   HGB 8.7* 06/26/2014 0400   HCT 28.5* 06/26/2014 0400   PLT 203 06/26/2014 0400   MCV 96.3 06/26/2014 0400   MCH 29.4 06/26/2014 0400   MCHC 30.5 06/26/2014 0400   RDW 16.4* 06/26/2014 0400   LYMPHSABS 0.7 06/23/2014 0500   MONOABS 0.7 06/23/2014 0500   EOSABS 0.1 06/23/2014  0500   BASOSABS 0.0 06/23/2014 0500     Assessment/Plan:  1. AKI/CKD in setting of decompensated diastolic CHF with azotemia consistent with cardiorenal syndrome. Pt responded well to boluses of IV Lasix and metolazone but had worsening azotemia and metabolic alkalosis leading to hypercapnic respiratory failure. May need to discussed EOL/QOL issues with family and pt (when she is more coherent) as she will require prolonged SNF placement and will not do well with dialysis long term.  1. Have removed 43kg of fluid with IV diuresis and CVVHD/IHD.  BP elevated will try to resume IV lasix in hopes that her cardiorenal syndrome has resolved after significant volume removal.  If she develops worsening azotemia, will need to return to IHD.   2. Pt will need tunneled HD catheter and AVF/AVG placement 3. Hopefully she will respond to IV lasix and not require longterm HD as she is a marginal candidate at best given poor functional status.  4. Cont to monitor UOP, hopefully as cardiac status improves, her renal function may improve. 2. Diastolic CHF- as above, off milrinone  1. Resume IV lasix and follow renal function, hold off on HD in hopes that improved cardiac function with massive diuresis. 3. AMS- resolved after intubation and initiation of HD. ABG revealed hypercapnea in pt with COPD, on oxygen and likely OSA due to morbid obesity and metabolic alkalosis from prerenal azotemia and aggressive diuresis. 4. Anemia- normocytic. Possibly related to CKD or acute illness. Will check iron stores and start epo. 5. Severe pulmonary venous HTN- as above 6. Hypokalemia- cont to replete 7. Hypercapnic resp arrest- now extubated and doing well 8. DM- per primary svc 9. Metabolic alkalosis- improved with CVVHD.  10. Deconditioning- agree that she will require SNF at the minimum and possible LTC facility if she requires HD. 11. Dispo- multi-organ failure in a morbidly obese female. Prognosis guarded agree with  PT/OT and mobilizatio.  Will try IV diuresis again in hopes that we can avoid longterm HD, if not she will need vascular access  placement 12.   Chrishaun Sasso A

## 2014-06-28 NOTE — Progress Notes (Signed)
Patient refused CPAP tonight. There Is a machine in the room at this time. RN aware. Explained to Patient that if they changed their mind, to just have the RN call Respiratory and we would come set them up. 

## 2014-06-28 NOTE — Progress Notes (Signed)
Pt refused CPAP tonight, RT will continue to monitor

## 2014-06-28 NOTE — Progress Notes (Signed)
Chart reviewed. [facility] Progress Note   Name: Kimberly ManilaSusan K [name] MRN: [id number] DOB: [birth year]   Pcp: Ginette OttoSTONEKING,[name] THOMAS  ADMISSION DATE:  06/09/2014  Summary: 58 y.o. F admitted 9/29 with anasarca, acute exacerbation of diastolic heart failure, AKI.  Since admission, has required lasix gtt and milrinone per recommendations of HF team - continues to have sluggish diuresis despite multiple meds.  On afternoon of 10/12, pt became more lethargic.  ABG demonstrated hypercarbic respiratory failure.  Pt placed on BiPAP but did not have any improvement.  PCCM consulted for  Intubation. Required CRRT for neg balance. Now intermittent HD. Weight down 80 lbs.  SIGNIFICANT EVENTS: 9/29 admit 9/30 renal consulted 10/3 renal signoff 10/05 HF team consulted, CVL placed for CVP's and Co-Ox's. 10/06 lasix gtt, milrinone started 10/10 renal re-consulted for sluggish diuresis, AKI. 10/12 PCCM consultation for progressive resp failure, intubated 10/13 in and out PA cath by Dr Gala RomneyBensimhon. Markedly elevated PCWP and PAPs. PAC removed and HD cath placed 10/13 CRRT initiated by Renal 10/14 negative > 5.5 liters over 24 hrs. Passed SBT. RASS 0, CAM-ICU negative. Extubated successfully. CRRT conitnued 10/15 episode of delerium / aphasia  Assessment/plan: Principal Problem:   Acute on chronic diastolic CHF (congestive heart failure), NYHA class 1: diuresed 80 lbs since admission. Getting intermittent HD. Nephrology resuming lasix    DM (diabetes mellitus), type 2 with renal complications: continue current for now. May need to increase if continues to run high    Essential hypertension    COPD    CKD (chronic kidney disease), stage III    Anasarca    AKI (acute kidney injury)    Unspecified hypothyroidism    Mitral valve stenosis, moderate    Pulmonary HTN    Acute-on-chronic renal failure    Acute on chronic respiratory failure with hypercapnia    Delirium    Cardiorenal syndrome  with renal failure    Chronic low back pain: worse since CT scan the other day. Does not want opiate analgesics. Will give lidoderm and PRN flexeril    Morbid obesity    Physical deconditioning, severe. Patient was essentially bedbound for several months PTA  STUDIES:  9/29 Renal US: kidneys and bladder not visualized secondary to body habitus. 9/30 Echo: EF 55-60%, Grade 2 diastolic dysfunction, MV mod stenosis, PA peak 77. 9/30 CT abd/pelvis: limited study due to body habitus, extensive edema throughout subcutaneous fat of abdomen, small right pleural effusion, mod abdominal pelvic ascites. 10/15 head CT -no acute findings,Remote infarct of the left occipital cortex  procedures L IJ CVL 10/07 >>  ETT 10/12 >> 10/14 10/13 R IJ HD cath >>  CRRT, HD  SUBJECTIVE:  C/o acute on chronic LBP. No dyspnea.  VITAL SIGNS: Temp:  [97.5 F (36.4 C)-98.9 F (37.2 C)] 98.3 F (36.8 C) (10/18 1000) Pulse Rate:  [90-98] 93 (10/18 1000) Resp:  [15-16] 16 (10/18 1000) BP: (127-158)/(33-65) 158/60 mmHg (10/18 1000) SpO2:  [98 %-100 %] 100 % (10/18 1000)    INTAKE / OUTPUT: Intake/Output     10/17 0701 - 10/18 0700 10/18 0701 - 10/19 0700   P.O.  120   I.V. (mL/kg)     Total Intake(mL/kg)  120 (1)   Urine (mL/kg/hr) 90 (0)    Other 3555 (1.2)    Total Output 3645     Net -3645 +120        Stool Occurrence 1 x 1 x     PHYSICAL EXAMINATION: General: Morbidly obese. Talkative. Oriented  and appropriate Cardiovascular: RRR without MGR Lungs: diminished without WRR Abdomen: obese, soft, +BS Ext: hyperpigmentation. No pitting edema   Recent Labs  06/27/14 1227 06/27/14 1627 06/27/14 2111 06/28/14 0833 06/28/14 1127 06/28/14 1705  GLUCAP 107* 133* 149* 134* 328* 196*   BMET BMET    Component Value Date/Time   NA 137 06/28/2014 0506   K 4.0 06/28/2014 0506   CL 98 06/28/2014 0506   CO2 25 06/28/2014 0506   GLUCOSE 139* 06/28/2014 0506   BUN 28* 06/28/2014 0506    CREATININE 1.72* 06/28/2014 0506   CALCIUM 9.4 06/28/2014 0506   GFRNONAA 31* 06/28/2014 0506   GFRAA 36* 06/28/2014 0506    CBC Latest Ref Rng 06/26/2014 06/25/2014 06/24/2014  WBC 4.0 - 10.5 K/uL 13.4(H) 14.3(H) 15.4(H)  Hemoglobin 12.0 - 15.0 g/dL 1.6(X8.7(L) 0.9(U8.6(L) 8.9(L)  Hematocrit 36.0 - 46.0 % 28.5(L) 28.3(L) 28.4(L)  Platelets 150 - 400 K/uL 203 198 195   [name],Khianna Blazina L, MD [facility] [phone]  06/28/2014, 2:08 PM

## 2014-06-29 LAB — CBC
HEMATOCRIT: 29.7 % — AB (ref 36.0–46.0)
HEMOGLOBIN: 9.6 g/dL — AB (ref 12.0–15.0)
MCH: 29.8 pg (ref 26.0–34.0)
MCHC: 32.3 g/dL (ref 30.0–36.0)
MCV: 92.2 fL (ref 78.0–100.0)
Platelets: 192 10*3/uL (ref 150–400)
RBC: 3.22 MIL/uL — ABNORMAL LOW (ref 3.87–5.11)
RDW: 16.1 % — AB (ref 11.5–15.5)
WBC: 8.2 10*3/uL (ref 4.0–10.5)

## 2014-06-29 LAB — RENAL FUNCTION PANEL
ANION GAP: 14 (ref 5–15)
Albumin: 3.2 g/dL — ABNORMAL LOW (ref 3.5–5.2)
BUN: 42 mg/dL — AB (ref 6–23)
CHLORIDE: 100 meq/L (ref 96–112)
CO2: 26 meq/L (ref 19–32)
CREATININE: 1.88 mg/dL — AB (ref 0.50–1.10)
Calcium: 9.7 mg/dL (ref 8.4–10.5)
GFR calc Af Amer: 33 mL/min — ABNORMAL LOW (ref >90)
GFR calc non Af Amer: 28 mL/min — ABNORMAL LOW (ref >90)
GLUCOSE: 198 mg/dL — AB (ref 70–99)
POTASSIUM: 4.2 meq/L (ref 3.7–5.3)
Phosphorus: 4 mg/dL (ref 2.3–4.6)
Sodium: 140 mEq/L (ref 137–147)

## 2014-06-29 LAB — GLUCOSE, CAPILLARY
GLUCOSE-CAPILLARY: 286 mg/dL — AB (ref 70–99)
Glucose-Capillary: 167 mg/dL — ABNORMAL HIGH (ref 70–99)
Glucose-Capillary: 288 mg/dL — ABNORMAL HIGH (ref 70–99)
Glucose-Capillary: 323 mg/dL — ABNORMAL HIGH (ref 70–99)

## 2014-06-29 MED ORDER — INSULIN GLARGINE 100 UNIT/ML ~~LOC~~ SOLN
15.0000 [IU] | Freq: Every day | SUBCUTANEOUS | Status: DC
  Administered 2014-06-29 – 2014-06-30 (×2): 15 [IU] via SUBCUTANEOUS
  Filled 2014-06-29 (×3): qty 0.15

## 2014-06-29 MED ORDER — MUPIROCIN 2 % EX OINT
1.0000 "application " | TOPICAL_OINTMENT | Freq: Two times a day (BID) | CUTANEOUS | Status: AC
  Administered 2014-06-29 – 2014-07-03 (×10): 1 via NASAL
  Filled 2014-06-29: qty 22

## 2014-06-29 MED ORDER — CHLORHEXIDINE GLUCONATE CLOTH 2 % EX PADS
6.0000 | MEDICATED_PAD | Freq: Every day | CUTANEOUS | Status: AC
  Administered 2014-06-29 – 2014-07-03 (×5): 6 via TOPICAL

## 2014-06-29 NOTE — Progress Notes (Signed)
RT spoke with patient about wearing CPAP/BiPAP and the patient stated that she tried wearing yesterday and she felt like she was having a panic attack. Patient told RT that she would not like to wear it tonight. RT will continue to monitor.

## 2014-06-29 NOTE — Progress Notes (Signed)
Handoff report (verbal and written) given to Genelle BalVanessa Crawford, [name].  Patient moved to her [facility] list.  Lorri FrederickDonna T. Jaci LazierCrowder, [place]CSW [phone]

## 2014-06-29 NOTE — Progress Notes (Addendum)
Speech Language Pathology Treatment: Dysphagia  Patient Details Name: ANNALIAH RIVENBARK MRN: [id number] DOB: July 16, 1956 Today's Date: 06/29/2014 Time: 5501-5868 SLP Time Calculation (min): 8 min  Assessment / Plan / Recommendation Clinical Impression  ST following pt., however no ST orders in chart (discontinued?).  Paged by RN that pt. was "doing much better" and doesn't like the thickener.  Pt. observed with thin (cup and straw) and cracker.  Prolonged oral mastication and transit (mild) with solid texture.  Delayed cough at end of session suspected unrelated to po's.  SLP educated pt. to use extra caution with straw in addition to clinical reasoning, small sips.  Recommend continue Dys 3 texture and upgrade liquids to thin; pills whole in applesauce.  No need to follow up.   HPI HPI: 58 y.o. year old female with significant past medical history of morbid obesity, diastolic dysfunction, IDDM, CAD, stage 3 CKD, chronic resp failure admitted with anasarca, diastolic CHF exacerbation, AKI.  CXR Worsened acute pulmonary edema. Tubes and lines unchanged imposition.  Intubated 10/12-10/14.  Decreased mental status and CT no acute abnormalities, remote occipital infarct.  During BSE 06/12/14, pt. 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  MBS 2011, results unknown.   Pertinent Vitals Pain Assessment: No/denies pain  SLP Plan  All goals met;Discharge SLP treatment due to (comment)    Recommendations Diet recommendations: Dysphagia 3 (mechanical soft);Thin liquid Liquids provided via: Cup;Straw Medication Administration: Whole meds with puree Supervision: Patient able to self feed Compensations: Slow rate;Small sips/bites Postural Changes and/or Swallow Maneuvers: Seated upright 90 degrees              Oral Care Recommendations: Oral care BID Follow up Recommendations: None Plan: All goals met;Discharge SLP  treatment due to (comment)         Houston Siren 06/29/2014, 2:00 PM  Orbie Pyo Colvin Caroli.Ed [employer] [phone]

## 2014-06-29 NOTE — Progress Notes (Signed)
Attempted to see pt.  Pt currently eating lunch.  Will check back as schedule allows. Tory EmeraldHolly Yetta Marceaux, [place] CarolinaOTR/L [phone]

## 2014-06-29 NOTE — Progress Notes (Signed)
Patient ID: Kimberly [name], female   DOB: [birth year], 58 y.o.   MRN: [id number] S: patient reports feeling better, breathing stable.  O:BP 164/61  Pulse 82  Temp(Src) 98.2 F (36.8 C) (Oral)  Resp 18  Ht 5\' 4"  (1.626 m)  Wt 260 lb 5.8 oz (118.1 kg)  BMI 44.67 kg/m2  SpO2 100%  Intake/Output Summary (Last 24 hours) at 06/29/14 1348 Last data filed at 06/29/14 1200  Gross per 24 hour  Intake    112 ml  Output   1500 ml  Net  -1388 ml   Intake/Output: I/O last 3 completed shifts: In: 150 [P.O.:120; I.V.:30] Out: 500 [Urine:500]  Intake/Output this shift:  Total I/O In: 82 [I.V.:20; IV Piggyback:62] Out: 1000 [Urine:1000] Weight change: -9 lb 14.7 oz (-4.5 kg)  Gen:NAD CVS: RRR, no m/r/g Resp: CTAB Abd: obese, soft, +BS Ext: LE remarkably dry/nonedematous.    Recent Labs Lab 06/24/14 0420 06/24/14 1555 06/25/14 0315 06/25/14 1630 06/26/14 0400 06/27/14 0500 06/28/14 0506 06/29/14 0440  NA 140 140 139 138 139 137 137 140  K 3.3* 4.1 3.7 4.7 4.4 4.7 4.0 4.2  CL 94* 98 99 99 101 100 98 100  CO2 35* 32 29 29 26 24 25 26   GLUCOSE 204* 177* 134* 149* 133* 138* 139* 198*  BUN 62* 50* 37* 27* 23 36* 28* 42*  CREATININE 1.76* 1.34* 1.19* 1.03 1.07 1.84* 1.72* 1.88*  ALBUMIN 3.1* 3.0* 3.0* 3.2* 3.1* 3.4* 3.3* 3.2*  CALCIUM 9.0 8.8 8.8 9.3 9.1 10.0 9.4 9.7  PHOS  --  2.3 2.4 2.2* 1.9* 2.3 3.4 4.0  AST 15  --   --   --   --   --   --   --   ALT 7  --   --   --   --   --   --   --    Liver Function Tests:  Recent Labs Lab 06/24/14 0420  06/27/14 0500 06/28/14 0506 06/29/14 0440  AST 15  --   --   --   --   ALT 7  --   --   --   --   ALKPHOS 101  --   --   --   --   BILITOT 1.2  --   --   --   --   PROT 7.6  --   --   --   --   ALBUMIN 3.1*  < > 3.4* 3.3* 3.2*  < > = values in this interval not displayed. No results found for this basename: LIPASE, AMYLASE,  in the last 168 hours No results found for this basename: AMMONIA,  in the last 168 hours CBC:  Recent  Labs Lab 06/23/14 0400 06/23/14 0500 06/24/14 0420 06/25/14 0315 06/26/14 0400 06/29/14 0440  WBC 18.4* 13.3* 15.4* 14.3* 13.4* 8.2  NEUTROABS 13.8* 11.8*  --   --   --   --   HGB 11.5* 8.7* 8.9* 8.6* 8.7* 9.6*  HCT 36.2 27.9* 28.4* 28.3* 28.5* 29.7*  MCV 94.3 94.3 95.3 97.3 96.3 92.2  PLT 384 201 195 198 203 192   Cardiac Enzymes: No results found for this basename: CKTOTAL, CKMB, CKMBINDEX, TROPONINI,  in the last 168 hours CBG:  Recent Labs Lab 06/28/14 1127 06/28/14 1705 06/28/14 2213 06/29/14 0803 06/29/14 1140  GLUCAP 328* 196* 257* 167* 286*    Iron Studies: No results found for this basename: IRON, TIBC, TRANSFERRIN, FERRITIN,  in the last 72 hours  Studies/Results: No results found. Kimberly [name]. amLODipine  5 mg Oral Daily  . Brinzolamide-Brimonidine  1 drop Both Eyes BID  . Chlorhexidine Gluconate Cloth  6 each Topical Q0600  . feeding supplement (ENSURE COMPLETE)  237 mL Oral BID BM  . furosemide  120 mg Intravenous BID  . heparin subcutaneous  5,000 Units Subcutaneous 3 times per day  . insulin aspart  0-15 Units Subcutaneous TID WC  . insulin aspart  0-5 Units Subcutaneous QHS  . insulin glargine  10 Units Subcutaneous QHS  . levothyroxine  50 mcg Oral QAC breakfast  . lidocaine  1 patch Transdermal Q24H  . metoprolol succinate  50 mg Oral Daily  . mupirocin ointment  1 application Nasal BID  . nystatin   Topical TID  . sodium chloride  10-40 mL Intracatheter Q12H  . sodium chloride  10-40 mL Intracatheter Q12H    BMET    Component Value Date/Time   NA 140 06/29/2014 0440   K 4.2 06/29/2014 0440   CL 100 06/29/2014 0440   CO2 26 06/29/2014 0440   GLUCOSE 198* 06/29/2014 0440   BUN 42* 06/29/2014 0440   CREATININE 1.88* 06/29/2014 0440   CALCIUM 9.7 06/29/2014 0440   GFRNONAA 28* 06/29/2014 0440   GFRAA 33* 06/29/2014 0440   CBC    Component Value Date/Time   WBC 8.2 06/29/2014 0440   RBC 3.22* 06/29/2014 0440   RBC 3.12* 05/10/2010 1512   HGB 9.6*  06/29/2014 0440   HCT 29.7* 06/29/2014 0440   PLT 192 06/29/2014 0440   MCV 92.2 06/29/2014 0440   MCH 29.8 06/29/2014 0440   MCHC 32.3 06/29/2014 0440   RDW 16.1* 06/29/2014 0440   LYMPHSABS 0.7 06/23/2014 0500   MONOABS 0.7 06/23/2014 0500   EOSABS 0.1 06/23/2014 0500   BASOSABS 0.0 06/23/2014 0500   Assessment/Plan:  1. AKI/CKD in setting of decompensated diastolic CHF with azotemia consistent with cardiorenal syndrome. Pt responded well to boluses of IV Lasix and metolazone but had worsening azotemia and metabolic alkalosis leading to hypercapnic respiratory failure. Palliative consulted, patient will likely not do well with long term HD. 1. Have removed 44.5kg of fluid with IV diuresis and CVVHD/IHD.  If she develops worsening azotemia, will need to return to IHD.   2. Pt will need tunneled HD catheter and AVF/AVG placement 3. Cont to monitor UOP, hopefully as cardiac status improves, her renal function may improve. 2. Diastolic CHF- as above, off milrinone  1. Recommend holding lasix today and monitoring overnight. Her UOP has been excellent and may be overdiuresing at this point. 3. AMS- resolved 4. Anemia- normocytic. Possibly related to CKD or acute illness. Recommend checking iron studies and starting epo. 5. Severe pulmonary venous HTN- as above 6. Hypokalemia- stable/resolved.  7. DM- per primary svc 8. Metabolic alkalosis- improved with CVVHD.  9. Deconditioning- agree that she will require SNF at the minimum and possible LTC facility if she requires HD. 10. Dispo- multi-organ failure in a morbidly obese female. Prognosis guarded agree.    [name], Andrew   Renal Attending: Last dialysis on 10/17.  Reported 1650cc UOP first shift today.  Aggressive fluid removal has been done.  Will hold diuretics tonight and reassess.  Hopeful for improvement.  Has CKD with baseline cr about 2.  WIll need to establish need for access.   [name]

## 2014-06-29 NOTE — Progress Notes (Signed)
Patient ID: Kimberly ManilaSusan K [name], female   DOB: [birth year], 58 y.o.   MRN: [id number] Advanced Heart Failure Rounding Note   Subjective:     58 y/o admitted with diastolic HD, anasarca and a/c renal failure.   Renal Ultrasound - inconclusive ECHO 06/10/14 EF 55-60% Grade II DD MV moderate stenosis. Peak PA pressure 77  Started intermittent HD last week. Urine output ~500cc/day. Now with frequent diarrhea. Denies dyspnea or orthopnea. Weight down 77 pounds from admit   Objective:   Weight Range:  Vital Signs:   Temp:  [98.1 F (36.7 C)-98.9 F (37.2 C)] 98.2 F (36.8 C) (10/19 0900) Pulse Rate:  [78-87] 82 (10/19 0900) Resp:  [16-18] 18 (10/19 0900) BP: (124-164)/(54-64) 164/61 mmHg (10/19 0900) SpO2:  [100 %] 100 % (10/19 0900) Weight:  [118.1 kg (260 lb 5.8 oz)] 118.1 kg (260 lb 5.8 oz) (10/19 0500) Last BM Date: 06/28/14  Weight change: Filed Weights   06/27/14 1206 06/28/14 2100 06/29/14 0500  Weight: 119.1 kg (262 lb 9.1 oz) 118.1 kg (260 lb 5.8 oz) 118.1 kg (260 lb 5.8 oz)    Intake/Output:   Intake/Output Summary (Last 24 hours) at 06/29/14 1213 Last data filed at 06/29/14 1200  Gross per 24 hour  Intake    112 ml  Output    800 ml  Net   -688 ml     Physical Exam:  General: Chronically ill appearing. No distress HEENT: normal. Neck: supple. LIJ TLC. RIJ trialysis Carotids 2+ bilat; no bruits. No lymphadenopathy or thryomegaly appreciated. RIJ  Cor: PMI nondisplaced. Regular rate & rhythm. No rubs, gallops or murmurs. Lungs: clear Abdomen: obese, nontender, nondistended. No hepatosplenomegaly. No bruits or masses. Good bowel sounds. Extremities: no cyanosis, clubbing, rash, R and LLE trace edema  Neuro:  Pleasant. Nonfocal.   Telemetry: SR 80-90s   Labs: Basic Metabolic Panel:  Recent Labs Lab 06/24/14 0420  06/25/14 0315 06/25/14 1630 06/26/14 0400 06/27/14 0500 06/28/14 0506 06/29/14 0440  NA 140  < > 139 138 139 137 137 140  K 3.3*  < > 3.7 4.7 4.4  4.7 4.0 4.2  CL 94*  < > 99 99 101 100 98 100  CO2 35*  < > 29 29 26 24 25 26   GLUCOSE 204*  < > 134* 149* 133* 138* 139* 198*  BUN 62*  < > 37* 27* 23 36* 28* 42*  CREATININE 1.76*  < > 1.19* 1.03 1.07 1.84* 1.72* 1.88*  CALCIUM 9.0  < > 8.8 9.3 9.1 10.0 9.4 9.7  MG 2.1  --  2.2  --  2.4  --   --   --   PHOS  --   < > 2.4 2.2* 1.9* 2.3 3.4 4.0  < > = values in this interval not displayed.  Liver Function Tests:  Recent Labs Lab 06/24/14 0420  06/25/14 1630 06/26/14 0400 06/27/14 0500 06/28/14 0506 06/29/14 0440  AST 15  --   --   --   --   --   --   ALT 7  --   --   --   --   --   --   ALKPHOS 101  --   --   --   --   --   --   BILITOT 1.2  --   --   --   --   --   --   PROT 7.6  --   --   --   --   --   --  ALBUMIN 3.1*  < > 3.2* 3.1* 3.4* 3.3* 3.2*  < > = values in this interval not displayed. No results found for this basename: LIPASE, AMYLASE,  in the last 168 hours No results found for this basename: AMMONIA,  in the last 168 hours  CBC:  Recent Labs Lab 06/23/14 0400 06/23/14 0500 06/24/14 0420 06/25/14 0315 06/26/14 0400 06/29/14 0440  WBC 18.4* 13.3* 15.4* 14.3* 13.4* 8.2  NEUTROABS 13.8* 11.8*  --   --   --   --   HGB 11.5* 8.7* 8.9* 8.6* 8.7* 9.6*  HCT 36.2 27.9* 28.4* 28.3* 28.5* 29.7*  MCV 94.3 94.3 95.3 97.3 96.3 92.2  PLT 384 201 195 198 203 192    Cardiac Enzymes: No results found for this basename: CKTOTAL, CKMB, CKMBINDEX, TROPONINI,  in the last 168 hours  BNP: BNP (last 3 results)  Recent Labs  09/10/13 1827 10/13/13 1916 06/09/14 1818  PROBNP 4050.0* 1519.0* 4358.0*     Other results:    Imaging: No results found.   Medications:     Scheduled Medications: . amLODipine  5 mg Oral Daily  . Brinzolamide-Brimonidine  1 drop Both Eyes BID  . Chlorhexidine Gluconate Cloth  6 each Topical Q0600  . feeding supplement (ENSURE COMPLETE)  237 mL Oral BID BM  . furosemide  120 mg Intravenous BID  . heparin subcutaneous  5,000  Units Subcutaneous 3 times per day  . insulin aspart  0-15 Units Subcutaneous TID WC  . insulin aspart  0-5 Units Subcutaneous QHS  . insulin glargine  10 Units Subcutaneous QHS  . levothyroxine  50 mcg Oral QAC breakfast  . lidocaine  1 patch Transdermal Q24H  . metoprolol succinate  50 mg Oral Daily  . mupirocin ointment  1 application Nasal BID  . nystatin   Topical TID  . sodium chloride  10-40 mL Intracatheter Q12H  . sodium chloride  10-40 mL Intracatheter Q12H    Infusions: . sodium chloride Stopped (06/23/14 1856)    PRN Medications: sodium chloride, sodium chloride, albuterol, cyclobenzaprine, feeding supplement (NEPRO CARB STEADY), fentaNYL, heparin, heparin, ipratropium-albuterol, lidocaine (PF), lidocaine-prilocaine, ondansetron, pentafluoroprop-tetrafluoroeth, RESOURCE THICKENUP CLEAR, simethicone, sodium chloride, sorbitol   Assessment:  1. A/C Diastolic HF ECHO EF 55-60%  2. Mitral Valve Stenosis 3. Obesity 4. DM --> Hgb A1C 7 5. Cardio Renal Syndrome 6. Hypothyroidism --> TSH 18.36  7. Immobility  8. Pulmonary venous HTN  9. Acute hypercarbic resp failure - 10. AKI, on CVVHD 11. Hypokalemia 12. AMS - CT head (10/15) no stroke 13. Anemia 14. Deconditioning   Plan/Discussion:     Now on intermittent HD. Suspect she will need long-term HD to maintain euvolemia  BP remains high. Will increase amlodipine to 10 daily. Will schedule for evening so as not to drop BP prior to HD. Renal can change as needed.   Suspect she may have c. Diff. Primary team working up.    Will follow at a distance. Please call me with questions.   [name]. Emmanuelle Coxe, MD 12:13 PM  Advanced Heart Failure Team Pager [phone] (M-F; 7a - 4p)  Please contact [facility] for night-coverage after hours (4p -7a ) and weekends on [email or web address]

## 2014-06-29 NOTE — Progress Notes (Signed)
[facility] Progress Note   Name: Kimberly [name] MRN: [id number] DOB: [birth year]   Pcp: Ginette OttoSTONEKING,[name] THOMAS  ADMISSION DATE:  06/09/2014  Summary: [age] y.o. F admitted 9/29 with anasarca, acute exacerbation of diastolic heart failure, AKI.  Since admission, has required lasix gtt and milrinone per recommendations of HF team - continues to have sluggish diuresis despite multiple meds.  On afternoon of 10/12, pt became more lethargic.  ABG demonstrated hypercarbic respiratory failure.  Pt placed on BiPAP but did not have any improvement.  PCCM consulted for  Intubation. Required CRRT for neg balance. Now intermittent HD. Weight down 80 lbs.  SIGNIFICANT EVENTS: 9/29 admit 9/30 renal consulted 10/3 renal signoff 10/05 HF team consulted, CVL placed for CVP's and Co-Ox's. 10/06 lasix gtt, milrinone started 10/10 renal re-consulted for sluggish diuresis, AKI. 10/12 PCCM consultation for progressive resp failure, intubated 10/13 in and out PA cath by Dr Gala RomneyBensimhon. Markedly elevated PCWP and PAPs. PAC removed and HD cath placed 10/13 CRRT initiated by Renal 10/14 negative > 5.5 liters over 24 hrs. Passed SBT. RASS 0, CAM-ICU negative. Extubated successfully. CRRT conitnued 10/15 episode of delerium / aphasia  Assessment/plan: Principal Problem:   Acute on chronic diastolic CHF (congestive heart failure), NYHA class 1: diuresed 80 lbs since admission. Getting intermittent HD. Nephrology resuming lasix  Loose stool: c diff pending    DM (diabetes mellitus), type 2 with renal complications: continue current for now. May need to increase if continues to run high    Essential hypertension    COPD    CKD (chronic kidney disease), stage III    Anasarca    AKI (acute kidney injury)    Unspecified hypothyroidism    Mitral valve stenosis, moderate    Pulmonary HTN    Acute-on-chronic renal failure    Acute on chronic respiratory failure with hypercapnia    Delirium   Cardiorenal syndrome with renal failure    Chronic low back pain: better with lidoderm    Morbid obesity    Physical deconditioning, severe. Patient was essentially bedbound for several months PTA  STUDIES:  9/29 Renal US: kidneys and bladder not visualized secondary to body habitus. 9/30 Echo: EF 55-60%, Grade 2 diastolic dysfunction, MV mod stenosis, PA peak 77. 9/30 CT abd/pelvis: limited study due to body habitus, extensive edema throughout subcutaneous fat of abdomen, small right pleural effusion, mod abdominal pelvic ascites. 10/15 head CT -no acute findings,Remote infarct of the left occipital cortex  procedures L IJ CVL 10/07 >>  ETT 10/12 >> 10/14 10/13 R IJ HD cath >>  CRRT, HD  SUBJECTIVE:  C/o acute on chronic LBP. No dyspnea.  VITAL SIGNS: Temp:  [98.1 F (36.7 C)-98.8 F (37.1 C)] 98.2 F (36.8 C) (10/19 2151) Pulse Rate:  [75-82] 75 (10/19 2151) Resp:  [17-18] 17 (10/19 2151) BP: (137-164)/(53-64) 137/60 mmHg (10/19 2151) SpO2:  [100 %] 100 % (10/19 2151) Weight:  [117.3 kg (258 lb 9.6 oz)-118.1 kg (260 lb 5.8 oz)] 117.3 kg (258 lb 9.6 oz) (10/19 2151)    INTAKE / OUTPUT: Intake/Output     10/19 0701 - 10/20 0700   P.O. 480   I.V. (mL/kg) 20 (0.2)   IV Piggyback 62   Total Intake(mL/kg) 562 (4.8)   Urine (mL/kg/hr) 1650 (0.9)   Total Output 1650   Net -1088       Stool Occurrence 3 x     PHYSICAL EXAMINATION: General: Morbidly obese. Talkative. Oriented and appropriate Cardiovascular: RRR without MGR Lungs: diminished  without WRR Abdomen: obese, soft, +BS Ext: hyperpigmentation. No pitting edema   Recent Labs  06/28/14 1127 06/28/14 1705 06/28/14 2213 06/29/14 0803 06/29/14 1140 06/29/14 1709  GLUCAP 328* 196* 257* 167* 286* 288*   BMET BMET    Component Value Date/Time   NA 140 06/29/2014 0440   K 4.2 06/29/2014 0440   CL 100 06/29/2014 0440   CO2 26 06/29/2014 0440   GLUCOSE 198* 06/29/2014 0440   BUN 42* 06/29/2014 0440    CREATININE 1.88* 06/29/2014 0440   CALCIUM 9.7 06/29/2014 0440   GFRNONAA 28* 06/29/2014 0440   GFRAA 33* 06/29/2014 0440    CBC Latest Ref Rng 06/29/2014 06/26/2014 06/25/2014  WBC 4.0 - 10.5 K/uL 8.2 13.4(H) 14.3(H)  Hemoglobin 12.0 - 15.0 g/dL 1.6(X9.6(L) 0.9(U8.7(L) 0.4(V8.6(L)  Hematocrit 36.0 - 46.0 % 29.7(L) 28.5(L) 28.3(L)  Platelets 150 - 400 K/uL 192 203 198   [name],[name], MD [facility] [phone]  06/29/2014, 10:08 PM

## 2014-06-29 NOTE — Progress Notes (Signed)
Patient ZD:GUYQI [name]      DOB: November 09, 1955      HKV:[id number]   Palliative Medicine Team at [facility] Progress Note    Subjective: Feeling fairly good today. She is happy to have some urine output. Pain doing better with lidoderm patch.       Filed Vitals:   06/29/14 0500  BP: 140/64  Pulse: 78  Temp: 98.1 F (36.7 C)  Resp: 18   Physical exam: GEN: alert, NAD HEENT: [place], mmm SKIN: warm/dry   Assessment and plan: 58 yo female with multiple medical problems admitted with AKI anasarca consistent with cardiorenal syndrome in setting of severe Pulm HTN.   1. Code Status: Full   2. Goals of Care:  See initial consult. Met in patients room with Kimberly [name], her husband and 3 brothers.  Kimberly [name] was able to demonstrate fairly good insight into her multiple medical conditions and the challenges she faces down the road. She knows that she is at high risk for decompensation due to her multiple comorbidites and that her prognostic outlook is worrisome because of this. I think this speaks to the excellent job of multiple physicians communicating with her and family.  Reviewed her medical situation with family to ensure they have this same understanding Kimberly [name] was particularly worried about her husband), and I think we were able to achieve that this morning.  Kimberly [name] still struggles with code status piece because she was successfully resuscitated from resp arrest. We talked about having a reversible cause solved by dialysis and that if this were to occur while she was being dialyzed chances of success likely lower. We also discussed her views on this may change based on how she is doing. She agrees with this, and would not want to be on ventilator.life support for more than few days if physicians felt low chance of returning to current QOL which is already challenged from her medical needs.  Family will discuss more. She would want her husband to make medical decisions for her if she were unable. Family  seems cohesive in thoughts about medical care and advocate for Kimberly [name]. Kimberly [name]'s mother was on hospice care, and they are aware that we may reach this point in near future with her.  Kimberly [name] would continue dialysis if offered, but of course hopes for renal recovery.  Will continue to follow along intermittently and I would request our service to be re-consulted in future admissions as well. I suspect her goals will evolve based on her clinical trajectory.   3. Symptom Management:  1. Pain- lidoderm patch working very well and I think is excellent choice.  Sensitive to opioids and would advocate we use non-opioid alternatives as able.    4. Psychosocial/Spiritual: Was living at home with her husband Kimberly [name]. Strong faith background. Has 2 brothers who are deacons at their local church. Very social person. Feels like she can have good social interactions in nursing facility.   Total Time: 35 minutes  >50% of time spent in counseling and coordination of care as noted above.   Kimberly [name] D.O. Palliative Medicine Team at [facility]  Pager: [phone] Team Phone: [phone]

## 2014-06-30 LAB — GLUCOSE, CAPILLARY
GLUCOSE-CAPILLARY: 273 mg/dL — AB (ref 70–99)
GLUCOSE-CAPILLARY: 463 mg/dL — AB (ref 70–99)
Glucose-Capillary: 263 mg/dL — ABNORMAL HIGH (ref 70–99)
Glucose-Capillary: 309 mg/dL — ABNORMAL HIGH (ref 70–99)

## 2014-06-30 LAB — RENAL FUNCTION PANEL
Albumin: 3.4 g/dL — ABNORMAL LOW (ref 3.5–5.2)
Anion gap: 13 (ref 5–15)
BUN: 48 mg/dL — ABNORMAL HIGH (ref 6–23)
CALCIUM: 9.7 mg/dL (ref 8.4–10.5)
CO2: 30 mEq/L (ref 19–32)
CREATININE: 1.93 mg/dL — AB (ref 0.50–1.10)
Chloride: 97 mEq/L (ref 96–112)
GFR calc Af Amer: 32 mL/min — ABNORMAL LOW (ref >90)
GFR calc non Af Amer: 27 mL/min — ABNORMAL LOW (ref >90)
GLUCOSE: 278 mg/dL — AB (ref 70–99)
Phosphorus: 4.4 mg/dL (ref 2.3–4.6)
Potassium: 3.9 mEq/L (ref 3.7–5.3)
Sodium: 140 mEq/L (ref 137–147)

## 2014-06-30 LAB — GLUCOSE, RANDOM: GLUCOSE: 498 mg/dL — AB (ref 70–99)

## 2014-06-30 LAB — CLOSTRIDIUM DIFFICILE BY PCR: Toxigenic C. Difficile by PCR: NEGATIVE

## 2014-06-30 NOTE — Progress Notes (Signed)
Patient ID: Kimberly [name], female   DOB: February 19, 1956, 58 y.o.   MRN: [id number] S: Much better today after night's rest (took muscle relaxer last night)  O:BP 132/57  Pulse 78  Temp(Src) 98.3 F (36.8 [name]) (Oral)  Resp 16  Ht 5\' 4"  (1.626 m)  Wt 258 lb 9.6 oz (117.3 kg)  BMI 44.37 kg/m2  SpO2 100%  Intake/Output Summary (Last 24 hours) at 06/30/14 0814 Last data filed at 06/30/14 0636  Gross per 24 hour  Intake    562 ml  Output   3050 ml  Net  -2488 ml   Intake/Output: I/O last 3 completed shifts: In: 562 [P.O.:480; I.V.:20; IV Piggyback:62] Out: 3050 [Urine:3050]  Intake/Output this shift:    Weight change: -1 lb 12.2 oz (-0.8 kg)  Gen:NAD CVS: RRR, no m/r/g Resp: CTAB Abd: obese, soft, +BS Ext: LE dry/nonedematous.    Recent Labs Lab 06/24/14 0420  06/25/14 0315 06/25/14 1630 06/26/14 0400 06/27/14 0500 06/28/14 0506 06/29/14 0440 06/30/14 0453  NA 140  < > 139 138 139 137 137 140 140  K 3.3*  < > 3.7 4.7 4.4 4.7 4.0 4.2 3.9  CL 94*  < > 99 99 101 100 98 100 97  CO2 35*  < > 29 29 26 24 25 26 30   GLUCOSE 204*  < > 134* 149* 133* 138* 139* 198* 278*  BUN 62*  < > 37* 27* 23 36* 28* 42* 48*  CREATININE 1.76*  < > 1.19* 1.03 1.07 1.84* 1.72* 1.88* 1.93*  ALBUMIN 3.1*  < > 3.0* 3.2* 3.1* 3.4* 3.3* 3.2* 3.4*  CALCIUM 9.0  < > 8.8 9.3 9.1 10.0 9.4 9.7 9.7  PHOS  --   < > 2.4 2.2* 1.9* 2.3 3.4 4.0 4.4  AST 15  --   --   --   --   --   --   --   --   ALT 7  --   --   --   --   --   --   --   --   < > = values in this interval not displayed. Liver Function Tests:  Recent Labs Lab 06/24/14 0420  06/28/14 0506 06/29/14 0440 06/30/14 0453  AST 15  --   --   --   --   ALT 7  --   --   --   --   ALKPHOS 101  --   --   --   --   BILITOT 1.2  --   --   --   --   PROT 7.6  --   --   --   --   ALBUMIN 3.1*  < > 3.3* 3.2* 3.4*  < > = values in this interval not displayed. No results found for this basename: LIPASE, AMYLASE,  in the last 168 hours No results found  for this basename: AMMONIA,  in the last 168 hours CBC:  Recent Labs Lab 06/24/14 0420 06/25/14 0315 06/26/14 0400 06/29/14 0440  WBC 15.4* 14.3* 13.4* 8.2  HGB 8.9* 8.6* 8.7* 9.6*  HCT 28.4* 28.3* 28.5* 29.7*  MCV 95.3 97.3 96.3 92.2  PLT 195 198 203 192   Cardiac Enzymes: No results found for this basename: CKTOTAL, CKMB, CKMBINDEX, TROPONINI,  in the last 168 hours CBG:  Recent Labs Lab 06/29/14 0803 06/29/14 1140 06/29/14 1709 06/29/14 2148 06/30/14 0759  GLUCAP 167* 286* 288* 323* 273*    Iron Studies: No results found  for this basename: IRON, TIBC, TRANSFERRIN, FERRITIN,  in the last 72 hours Studies/Results: No results found. Marland Kitchen. amLODipine  5 mg Oral Daily  . Brinzolamide-Brimonidine  1 drop Both Eyes BID  . Chlorhexidine Gluconate Cloth  6 each Topical Q0600  . feeding supplement (ENSURE COMPLETE)  237 mL Oral BID BM  . furosemide  120 mg Intravenous BID  . heparin subcutaneous  5,000 Units Subcutaneous 3 times per day  . insulin aspart  0-15 Units Subcutaneous TID WC  . insulin aspart  0-5 Units Subcutaneous QHS  . insulin glargine  15 Units Subcutaneous QHS  . levothyroxine  50 mcg Oral QAC breakfast  . lidocaine  1 patch Transdermal Q24H  . metoprolol succinate  50 mg Oral Daily  . mupirocin ointment  1 application Nasal BID  . nystatin   Topical TID  . sodium chloride  10-40 mL Intracatheter Q12H  . sodium chloride  10-40 mL Intracatheter Q12H    BMET    Component Value Date/Time   NA 140 06/30/2014 0453   K 3.9 06/30/2014 0453   CL 97 06/30/2014 0453   CO2 30 06/30/2014 0453   GLUCOSE 278* 06/30/2014 0453   BUN 48* 06/30/2014 0453   CREATININE 1.93* 06/30/2014 0453   CALCIUM 9.7 06/30/2014 0453   GFRNONAA 27* 06/30/2014 0453   GFRAA 32* 06/30/2014 0453   CBC    Component Value Date/Time   WBC 8.2 06/29/2014 0440   RBC 3.22* 06/29/2014 0440   RBC 3.12* 05/10/2010 1512   HGB 9.6* 06/29/2014 0440   HCT 29.7* 06/29/2014 0440   PLT 192  06/29/2014 0440   MCV 92.2 06/29/2014 0440   MCH 29.8 06/29/2014 0440   MCHC 32.3 06/29/2014 0440   RDW 16.1* 06/29/2014 0440   LYMPHSABS 0.7 06/23/2014 0500   MONOABS 0.7 06/23/2014 0500   EOSABS 0.1 06/23/2014 0500   BASOSABS 0.0 06/23/2014 0500   Assessment/Plan:  1. AKI/CKD in setting of decompensated diastolic CHF with azotemia consistent with cardiorenal syndrome. Palliative consulted, patient will likely not do well with long term HD. Scr stable currently 1.93. 1. Have removed 46.1kg of fluid with IV diuresis and CVVHD/IHD.  If she develops worsening azotemia, will need to return to IHD; if this occurs will need TDC and AVF/AVG 2. Diastolic CHF- as above, off milrinone  1. Stopped lasix today and monitor overnight. Her UOP over past 24hrs 3L. 3. AMS- resolved 4. Anemia- normocytic. Possibly related to CKD or acute illness. Recommend checking iron studies and starting epo. 5. Severe pulmonary venous HTN- as above 6. Hypokalemia- stable/resolved.  7. DM- per primary svc 8. Metabolic alkalosis- improved with CVVHD.   Kimberly [name], Kimberly [name]  Kimberly [name] Kimberly [name]: She does not need any more diuretics at the current time.  We need to see what her Kimberly [name] function will do with limited support.  Lat dialysis was on 10/17. Kimberly [name]

## 2014-06-30 NOTE — Progress Notes (Signed)
Patient continues to refuse CPAP/BIPAP. RT will continue to assist as needed.

## 2014-06-30 NOTE — Progress Notes (Signed)
PROGRESS NOTE  Kimberly [name] ZOX:[id number] DOB: 1956-05-29 DOA: 06/09/2014 PCP: Ginette OttoSTONEKING,[name] THOMAS, MD  Summary: 58 y.o. F admitted 9/29 with anasarca, acute exacerbation of diastolic heart failure, AKI. Since admission, has required lasix gtt and milrinone per recommendations of HF team - continues to have sluggish diuresis despite multiple meds. On afternoon of 10/12, pt became more lethargic. ABG demonstrated hypercarbic respiratory failure. Pt placed on BiPAP but did not have any improvement. PCCM consulted for Intubation. Required CRRT for neg balance. Now intermittent HD. Weight down 80+ lbs  SIGNIFICANT EVENTS:  9/29 admit  9/30 renal consulted  10/3 renal signoff  10/05 HF team consulted, CVL placed for CVP's and Co-Ox's.  10/06 lasix gtt, milrinone started  10/10 renal re-consulted for sluggish diuresis, AKI.  10/12 PCCM consultation for progressive resp failure, intubated  10/13 in and out PA cath by Dr Gala RomneyBensimhon. Markedly elevated PCWP and PAPs. PAC removed and HD cath placed  10/13 CRRT initiated by Renal  10/14 negative > 5.5 liters over 24 hrs. Passed SBT. RASS 0, CAM-ICU negative. Extubated successfully. CRRT conitnued  10/15 episode of delerium / aphasia  Assessment/Plan: Acute on chronic diastolic CHF (congestive heart failure), NYHA class 1: diuresed 80 lbs since admission. Getting intermittent HD. Nephrology resuming lasix IV  Loose stool: c diff negative  DM (diabetes mellitus), type 2 with renal complications: continue current for now. May need to increase if continues to run high  Essential hypertension  COPD  CKD (chronic kidney disease), stage III  Anasarca  AKI (acute kidney injury)  Unspecified hypothyroidism  Mitral valve stenosis, moderate  Pulmonary HTN  Acute-on-chronic renal failure  Acute on chronic respiratory failure with hypercapnia  Delirium  Cardiorenal syndrome with renal failure  Chronic low back pain: better with lidoderm - palliative  following for pain Morbid obesity  Physical deconditioning, severe. Patient was essentially bedbound for several months PTA- patient wants to go to SNF   Code Status: full Family Communication: patient Disposition Plan: SNF once diuresed   Consultants:  PCCM  Renal   Palliative care (pain management)  Procedures: L IJ CVL 10/07 >>  ETT 10/12 >> 10/14  10/13 R IJ HD cath >>  CRRT, HD     HPI/Subjective: Feeling better Less diarrhea Wants to go to SNF  Objective: Filed Vitals:   06/30/14 1001  BP: 126/41  Pulse: 83  Temp: 98.4 F (36.9 C)  Resp: 16    Intake/Output Summary (Last 24 hours) at 06/30/14 1049 Last data filed at 06/30/14 1003  Gross per 24 hour  Intake    360 ml  Output   2350 ml  Net  -1990 ml   Filed Weights   06/28/14 2100 06/29/14 0500 06/29/14 2151  Weight: 118.1 kg (260 lb 5.8 oz) 118.1 kg (260 lb 5.8 oz) 117.3 kg (258 lb 9.6 oz)    Exam:   General:  A+Ox3, NAD  Cardiovascular: rrr  Respiratory: no wheezing  Abdomen: obese  Musculoskeletal: no edema- LE   Data Reviewed: Basic Metabolic Panel:  Recent Labs Lab 06/24/14 0420  06/25/14 0315  06/26/14 0400 06/27/14 0500 06/28/14 0506 06/29/14 0440 06/30/14 0453  NA 140  < > 139  < > 139 137 137 140 140  K 3.3*  < > 3.7  < > 4.4 4.7 4.0 4.2 3.9  CL 94*  < > 99  < > 101 100 98 100 97  CO2 35*  < > 29  < > 26 24 25 26  30  GLUCOSE 204*  < > 134*  < > 133* 138* 139* 198* 278*  BUN 62*  < > 37*  < > 23 36* 28* 42* 48*  CREATININE 1.76*  < > 1.19*  < > 1.07 1.84* 1.72* 1.88* 1.93*  CALCIUM 9.0  < > 8.8  < > 9.1 10.0 9.4 9.7 9.7  MG 2.1  --  2.2  --  2.4  --   --   --   --   PHOS  --   < > 2.4  < > 1.9* 2.3 3.4 4.0 4.4  < > = values in this interval not displayed. Liver Function Tests:  Recent Labs Lab 06/24/14 0420  06/26/14 0400 06/27/14 0500 06/28/14 0506 06/29/14 0440 06/30/14 0453  AST 15  --   --   --   --   --   --   ALT 7  --   --   --   --   --   --     ALKPHOS 101  --   --   --   --   --   --   BILITOT 1.2  --   --   --   --   --   --   PROT 7.6  --   --   --   --   --   --   ALBUMIN 3.1*  < > 3.1* 3.4* 3.3* 3.2* 3.4*  < > = values in this interval not displayed. No results found for this basename: LIPASE, AMYLASE,  in the last 168 hours No results found for this basename: AMMONIA,  in the last 168 hours CBC:  Recent Labs Lab 06/24/14 0420 06/25/14 0315 06/26/14 0400 06/29/14 0440  WBC 15.4* 14.3* 13.4* 8.2  HGB 8.9* 8.6* 8.7* 9.6*  HCT 28.4* 28.3* 28.5* 29.7*  MCV 95.3 97.3 96.3 92.2  PLT 195 198 203 192   Cardiac Enzymes: No results found for this basename: CKTOTAL, CKMB, CKMBINDEX, TROPONINI,  in the last 168 hours BNP (last 3 results)  Recent Labs  09/10/13 1827 10/13/13 1916 06/09/14 1818  PROBNP 4050.0* 1519.0* 4358.0*   CBG:  Recent Labs Lab 06/29/14 0803 06/29/14 1140 06/29/14 1709 06/29/14 2148 06/30/14 0759  GLUCAP 167* 286* 288* 323* 273*    Recent Results (from the past 240 hour(s))  CULTURE, RESPIRATORY (NON-EXPECTORATED)     Status: None   Collection Time    06/22/14  9:20 PM      Result Value Ref Range Status   Specimen Description TRACHEAL ASPIRATE   Final   Special Requests NONE   Final   Gram Stain     Final   Value: MODERATE WBC PRESENT,BOTH PMN AND MONONUCLEAR     RARE SQUAMOUS EPITHELIAL CELLS PRESENT     NO ORGANISMS SEEN     Performed at [facility]   Culture     Final   Value: Non-Pathogenic Oropharyngeal-type Flora Isolated.     Performed at [facility]   Report Status 06/25/2014 FINAL   Final  CLOSTRIDIUM DIFFICILE BY PCR     Status: None   Collection Time    06/29/14  5:12 PM      Result Value Ref Range Status   C difficile by pcr NEGATIVE  NEGATIVE Final     Studies: No results found.  Scheduled Meds: . amLODipine  5 mg Oral Daily  . Brinzolamide-Brimonidine  1 drop Both Eyes BID  . Chlorhexidine Gluconate Cloth  6  each Topical [id number]  . feeding  supplement (ENSURE COMPLETE)  237 mL Oral BID BM  . furosemide  120 mg Intravenous BID  . heparin subcutaneous  5,000 Units Subcutaneous 3 times per day  . insulin aspart  0-15 Units Subcutaneous TID WC  . insulin aspart  0-5 Units Subcutaneous QHS  . insulin glargine  15 Units Subcutaneous QHS  . levothyroxine  50 mcg Oral QAC breakfast  . lidocaine  1 patch Transdermal Q24H  . metoprolol succinate  50 mg Oral Daily  . mupirocin ointment  1 application Nasal BID  . nystatin   Topical TID  . sodium chloride  10-40 mL Intracatheter Q12H  . sodium chloride  10-40 mL Intracatheter Q12H   Continuous Infusions: . sodium chloride Stopped (06/23/14 1856)   Antibiotics Given (last 72 hours)   None      Principal Problem:   Acute on chronic diastolic CHF (congestive heart failure), NYHA class 1 Active Problems:   DM (diabetes mellitus), type 2 with renal complications   HYPERLIPIDEMIA   TOBACCO ABUSE   Essential hypertension   COPD   CKD (chronic kidney disease), stage III   Anasarca   AKI (acute kidney injury)   Unspecified hypothyroidism   Mitral valve stenosis, moderate   Pulmonary HTN   Acute-on-chronic renal failure   Acute on chronic respiratory failure with hypercapnia   Delirium   Cardiorenal syndrome with renal failure   Chronic low back pain   Morbid obesity   Physical deconditioning    Time spent: 35 min    Shakiara Lukic  [facility] Pager [phone]. If 7PM-7AM, please contact night-coverage at [email or web address], password [email or web address] 06/30/2014, 10:49 AM  LOS: 21 days

## 2014-06-30 NOTE — Progress Notes (Signed)
Inpatient Diabetes Program Recommendations  AACE/ADA: New Consensus Statement on Inpatient Glycemic Control (2013)  Target Ranges:  Prepandial:   less than 140 mg/dL      Peak postprandial:   less than 180 mg/dL (1-2 hours)      Critically ill patients:  140 - 180 mg/dL   Reason for Visit: Hyperglycemia  Results for Kimberly [name], [name] (MRN [id number]) as of 06/30/2014 13:41  Ref. Range 06/29/2014 11:40 06/29/2014 17:09 06/29/2014 21:48 06/30/2014 07:59 06/30/2014 11:38  Glucose-Capillary Latest Range: 70-99 mg/dL 409286 (H) 811288 (H) 914323 (H) 273 (H) 463 (H)   Blood sugars consistently 200-400s in last 24 hours. Drank 3 Ensure supplements in last 24 hours. CBG was checked at lunch and pt had consumed supplement within an hour, which may result in higher blood sugar. PO intake still variable.  Recommendations: Increase Lantus to 22 units QHS Change Ensure supplement to Glucerna for improved glucose control. CBG checks prior to supplement or 2 hours after supplement for accuracy.  Will follow. Thank you. Ailene Ardshonda Sheranda Seabrooks, RD, LDN, CDE Inpatient Diabetes Coordinator [phone]

## 2014-06-30 NOTE — Progress Notes (Addendum)
Pt reports blurry vision with white light since this AM. Kimberly CottaKirby NP notified. No new orders at this time. If blurred vision continues to persist until AM, notify NP. Will continue to monitor. Kimberly [name], Dorinda Stehr J

## 2014-07-01 LAB — RENAL FUNCTION PANEL
ALBUMIN: 3.4 g/dL — AB (ref 3.5–5.2)
ANION GAP: 15 (ref 5–15)
BUN: 54 mg/dL — AB (ref 6–23)
CO2: 31 mEq/L (ref 19–32)
Calcium: 9.3 mg/dL (ref 8.4–10.5)
Chloride: 93 mEq/L — ABNORMAL LOW (ref 96–112)
Creatinine, Ser: 2 mg/dL — ABNORMAL HIGH (ref 0.50–1.10)
GFR calc Af Amer: 30 mL/min — ABNORMAL LOW (ref >90)
GFR, EST NON AFRICAN AMERICAN: 26 mL/min — AB (ref >90)
Glucose, Bld: 285 mg/dL — ABNORMAL HIGH (ref 70–99)
POTASSIUM: 3.7 meq/L (ref 3.7–5.3)
Phosphorus: 4.1 mg/dL (ref 2.3–4.6)
Sodium: 139 mEq/L (ref 137–147)

## 2014-07-01 LAB — GLUCOSE, CAPILLARY
GLUCOSE-CAPILLARY: 397 mg/dL — AB (ref 70–99)
Glucose-Capillary: 286 mg/dL — ABNORMAL HIGH (ref 70–99)
Glucose-Capillary: 285 mg/dL — ABNORMAL HIGH (ref 70–99)
Glucose-Capillary: 312 mg/dL — ABNORMAL HIGH (ref 70–99)

## 2014-07-01 MED ORDER — INSULIN GLARGINE 100 UNIT/ML ~~LOC~~ SOLN
22.0000 [IU] | Freq: Every day | SUBCUTANEOUS | Status: DC
  Administered 2014-07-01: 22 [IU] via SUBCUTANEOUS
  Filled 2014-07-01: qty 0.22

## 2014-07-01 NOTE — Evaluation (Signed)
Occupational Therapy Evaluation Patient Details Name: Galen ManilaSusan K Meegan MRN: [id number] DOB: July 08, 1956 Today's Date: 07/01/2014    History of Present Illness Pt presents with Anasarca and SOB. Pt with progressive respiratory failure and was intubated on 10/12, extubated 10/14.Pt presents with Anasarca and SOB. Pt with progressive respiratory failure and was intubated on 10/12, extubated 10/14. Pt has been on temporary dialysis.   Clinical Impression   Pt ambulates short distances and tranfers to 3 in 1 from her lift chair or hospital bed prior to admission.  She was dependent in bathing, dressing, pericare, meal prep and housekeeping.  Pt has a high anxiety level with regard to falling.  Pt was able to get to the side of the bed without physical assist, stood and pivoted with the RW with +2 minimal assistance.  Pt fatigues easily.  Will benefit from post acute rehab.  Will defer further OT to SNF.    Follow Up Recommendations  SNF;Supervision/Assistance - 24 hour    Equipment Recommendations       Recommendations for Other Services       Precautions / Restrictions Precautions Precautions: Fall Precaution Comments: O2 dependent at home.   Restrictions Weight Bearing Restrictions: No      Mobility Bed Mobility Overal bed mobility: Needs Assistance Bed Mobility: Supine to Sit     Supine to sit: Supervision;HOB elevated     General bed mobility comments: with verbal cues and heavy use of rail  Transfers Overall transfer level: Needs assistance Equipment used: Rolling walker (2 wheeled) Transfers: Sit to/from Stand Sit to Stand: +2 physical assistance;Min assist;From elevated surface Stand pivot transfers: +2 physical assistance;Min assist       General transfer comment: Pt gets anxious with tranfer, needed reassurance.    Balance     Sitting balance-Leahy Scale: Fair     Standing balance support: Bilateral upper extremity supported Standing balance-Leahy Scale:  Poor                              ADL Overall ADL's : Needs assistance/impaired Eating/Feeding: Independent;Bed level   Grooming: Wash/dry hands;Wash/dry face;Oral care;Set up;Sitting   Upper Body Bathing: Minimal assitance;Sitting   Lower Body Bathing: +2 for physical assistance;Total assistance;Sit to/from stand   Upper Body Dressing : Minimal assistance;Sitting   Lower Body Dressing: +2 for physical assistance;Total assistance;Sit to/from stand   Toilet Transfer: Minimal assistance;+2 for physical assistance   Toileting- Clothing Manipulation and Hygiene: +2 for physical assistance;Total assistance       Functional mobility during ADLs: +2 for physical assistance;Minimal assistance;Rolling walker General ADL Comments: Pt is familiar with [facility] AE from previous rehab stay.     Vision                     Perception     Praxis      Pertinent Vitals/Pain Pain Assessment: Faces Faces Pain Scale: Hurts little more Pain Location: back Pain Descriptors / Indicators: Aching Pain Intervention(s): Monitored during session;Repositioned     Hand Dominance Right   Extremity/Trunk Assessment Upper Extremity Assessment Upper Extremity Assessment: Overall WFL for tasks assessed   Lower Extremity Assessment Lower Extremity Assessment: Defer to PT evaluation       Communication Communication Communication: No difficulties   Cognition Arousal/Alertness: Awake/alert Behavior During Therapy: Anxious Overall Cognitive Status: Within Functional Limits for tasks assessed  General Comments       Exercises       Shoulder Instructions      Home Living Family/patient expects to be discharged to:: Skilled nursing facility Living Arrangements: Spouse/significant other Available Help at Discharge: Family;Available PRN/intermittently (spouse works) Type of Home: House Home Access: Ramped entrance     Home Layout: One level                Home Equipment: Environmental consultantWalker - 2 wheels;Walker - 4 wheels;Bedside commode;Wheelchair - manual;Hospital bed (lift chair)   Additional Comments: pt has been receiving HHPT, has only been able to transfer past 3wks, before that was ambulating about 10' at a time      Prior Functioning/Environment Level of Independence: Needs assistance  Gait / Transfers Assistance Needed: pt indicates only amb ~10' to 3-in-1, otherwise trasnfers.   ADL's / Homemaking Assistance Needed: husband assisted with bathing/dressing, meal prep and housekeeping   Comments: uses RW at home, sleeps in hospital bed, uses BSC to conserve energy at home    OT Diagnosis: Generalized weakness   OT Problem List: Decreased strength;Decreased activity tolerance;Impaired balance (sitting and/or standing);Decreased knowledge of use of DME or AE;Obesity;Pain;Cardiopulmonary status limiting activity   OT Treatment/Interventions:      OT Goals(Current goals can be found in the care plan section) Acute Rehab OT Goals Patient Stated Goal: To get stronger - states she has been doing her HEP  OT Frequency:     Barriers to D/C:            Co-evaluation              End of Session    Activity Tolerance:   Patient left: in chair;with call bell/phone within reach (MD present)   Time: [id number] OT Time Calculation (min): 19 min Charges:  OT General Charges $OT Visit: 1 Procedure OT Evaluation $Initial OT Evaluation Tier I: 1 Procedure G-Codes:    Evern BioMayberry, Mikias Lanz Lynn 07/01/2014, 10:28 AM [phone]

## 2014-07-01 NOTE — Progress Notes (Signed)
Palliative medicine team shadowing chart for needs. Goals evolving based on her clinical condition- but for now patient and family desire full scope treatment. Noted plan for SNF at discharge. Recommend Palliative Care services follow at SNF to assist with care transition. Family aware of the severity of her condition and high risk for deterioration-will need close management of her heart failure and renal failure and support with goals of care/symptom management to avoid readmission from SNF. We are available to assist should her goals change or if there are additional symptom management needs.  Anderson MaltaElizabeth Doll Frazee, DO Palliative Medicine [phone]

## 2014-07-01 NOTE — Progress Notes (Signed)
Physical Therapy Treatment Patient Details Name: Kimberly ManilaSusan K [name] MRN: [id number] DOB: 20-Jun-1956 Today's Date: 07/01/2014    History of Present Illness Pt presents with Anasarca and SOB. Pt with progressive respiratory failure and was intubated on 10/12, extubated 10/14.Pt presents with Anasarca and SOB. Pt with progressive respiratory failure and was intubated on 10/12, extubated 10/14. Pt has been on temporary dialysis.    PT Comments    Pt motivated to participate with PT although anxious with regard to mobility/OOB; Pt min+2 today for bed to chair transfers, much improved from last session; will continue to follow; pt will benefit from SNF  Follow Up Recommendations  SNF;Supervision for mobility/OOB     Equipment Recommendations  None recommended by PT    Recommendations for Other Services       Precautions / Restrictions Precautions Precautions: Fall Precaution Comments: O2 dependent at home.   Restrictions Weight Bearing Restrictions: No    Mobility  Bed Mobility Overal bed mobility: Needs Assistance Bed Mobility: Supine to Sit     Supine to sit: Supervision;HOB elevated     General bed mobility comments: with verbal cues and heavy use of rail, partial roll to pt right side  Transfers Overall transfer level: Needs assistance Equipment used: Rolling walker (2 wheeled) Transfers: Sit to/from BJ'sStand;Stand Pivot Transfers Sit to Stand: +2 physical assistance;Min assist;From elevated surface Stand pivot transfers: +2 physical assistance;Min assist       General transfer comment: pt anxious with standing, transfers, requires verbal cues and reassurance to complete task  Ambulation/Gait                 Stairs            Wheelchair Mobility    Modified Rankin (Stroke Patients Only)       Balance     Sitting balance-Leahy Scale: Fair     Standing balance support: Bilateral upper extremity supported;During functional activity Standing  balance-Leahy Scale: Poor Standing balance comment: requires support of RW and too anxious to attempt without                    Cognition Arousal/Alertness: Awake/alert Behavior During Therapy: Anxious Overall Cognitive Status: Within Functional Limits for tasks assessed                      Exercises      General Comments        Pertinent Vitals/Pain Pain Assessment: Faces Faces Pain Scale: Hurts little more Pain Location: back Pain Descriptors / Indicators: Aching Pain Intervention(s): Monitored during session;Repositioned    Home Living Family/patient expects to be discharged to:: Skilled nursing facility Living Arrangements: Spouse/significant other Available Help at Discharge: Family;Available PRN/intermittently (spouse works) Type of Home: House Home Access: Ramped entrance   Home Layout: One level Home Equipment: Environmental consultantWalker - 2 wheels;Walker - 4 wheels;Bedside commode;Wheelchair - manual;Hospital bed (lift chair) Additional Comments: pt has been receiving HHPT, has only been able to transfer past 3wks, before that was ambulating about 10' at a time    Prior Function Level of Independence: Needs assistance  Gait / Transfers Assistance Needed: pt indicates only amb ~10' to 3-in-1, otherwise trasnfers.   ADL's / Homemaking Assistance Needed: husband assisted with bathing/dressing, meal prep and housekeeping Comments: uses RW at home, sleeps in hospital bed, uses BSC to conserve energy at home   PT Goals (current goals can now be found in the care plan section) Acute Rehab PT Goals Patient Stated Goal:  To get stronger - states she has been doing her HEP PT Goal Formulation: With patient Time For Goal Achievement: 07/03/14 Potential to Achieve Goals: Fair Progress towards PT goals: Progressing toward goals    Frequency  Min 3X/week    PT Plan Current plan remains appropriate    Co-evaluation PT/OT/SLP Co-Evaluation/Treatment: Yes Reason for  Co-Treatment: For patient/therapist safety PT goals addressed during session: Mobility/safety with mobility;Proper use of DME       End of Session Equipment Utilized During Treatment: Oxygen Activity Tolerance: Patient tolerated treatment well Patient left: in chair;with call bell/phone within reach     Time: [id number] PT Time Calculation (min): 20 min  Charges:  $Therapeutic Activity: 8-22 mins                    G Codes:      Elysa Womac 07/01/2014, 10:39 AM

## 2014-07-01 NOTE — Progress Notes (Signed)
Dr. Jomarie LongsJoseph stated that pt can have central line untill discharge but to double check with nephrology team. Nephrology team paged.

## 2014-07-01 NOTE — Progress Notes (Signed)
Patient ID: Kimberly ManilaSusan K Sanfilippo, female   DOB: 1956-03-23, 58 y.o.   MRN: [id number] S: No complaints, she feels good overall, has a "rattle in chest". Also thinks she is starting to feel depressed from staying in bed.  O:BP 113/59  Pulse 79  Temp(Src) 98.1 F (36.7 C) (Oral)  Resp 16  Ht 5\' 4"  (1.626 m)  Wt 253 lb 11.2 oz (115.078 kg)  BMI 43.53 kg/m2  SpO2 100%  Intake/Output Summary (Last 24 hours) at 07/01/14 0816 Last data filed at 06/30/14 1859  Gross per 24 hour  Intake    240 ml  Output      0 ml  Net    240 ml   Intake/Output: I/O last 3 completed shifts: In: 240 [P.O.:240] Out: 1400 [Urine:1400]  Intake/Output this shift:    Weight change: -4 lb 14.4 oz (-2.222 kg)  Gen:NAD CVS: RRR, no m/r/g Resp: CTAB Abd: obese, soft, +BS Ext: LE dry/nonedematous.    Recent Labs Lab 06/25/14 1630 06/26/14 0400 06/27/14 0500 06/28/14 0506 06/29/14 0440 06/30/14 0453 06/30/14 1157 07/01/14 0433  NA 138 139 137 137 140 140  --  139  K 4.7 4.4 4.7 4.0 4.2 3.9  --  3.7  CL 99 101 100 98 100 97  --  93*  CO2 29 26 24 25 26 30   --  31  GLUCOSE 149* 133* 138* 139* 198* 278* 498* 285*  BUN 27* 23 36* 28* 42* 48*  --  54*  CREATININE 1.03 1.07 1.84* 1.72* 1.88* 1.93*  --  2.00*  ALBUMIN 3.2* 3.1* 3.4* 3.3* 3.2* 3.4*  --  3.4*  CALCIUM 9.3 9.1 10.0 9.4 9.7 9.7  --  9.3  PHOS 2.2* 1.9* 2.3 3.4 4.0 4.4  --  4.1   Liver Function Tests:  Recent Labs Lab 06/29/14 0440 06/30/14 0453 07/01/14 0433  ALBUMIN 3.2* 3.4* 3.4*   No results found for this basename: LIPASE, AMYLASE,  in the last 168 hours No results found for this basename: AMMONIA,  in the last 168 hours CBC:  Recent Labs Lab 06/25/14 0315 06/26/14 0400 06/29/14 0440  WBC 14.3* 13.4* 8.2  HGB 8.6* 8.7* 9.6*  HCT 28.3* 28.5* 29.7*  MCV 97.3 96.3 92.2  PLT 198 203 192   Cardiac Enzymes: No results found for this basename: CKTOTAL, CKMB, CKMBINDEX, TROPONINI,  in the last 168 hours CBG:  Recent Labs Lab  06/30/14 0759 06/30/14 1138 06/30/14 1630 06/30/14 2114 07/01/14 0809  GLUCAP 273* 463* 263* 309* 286*    Iron Studies: No results found for this basename: IRON, TIBC, TRANSFERRIN, FERRITIN,  in the last 72 hours Studies/Results: No results found. Marland Kitchen. amLODipine  5 mg Oral Daily  . Brinzolamide-Brimonidine  1 drop Both Eyes BID  . Chlorhexidine Gluconate Cloth  6 each Topical Q0600  . feeding supplement (ENSURE COMPLETE)  237 mL Oral BID BM  . heparin subcutaneous  5,000 Units Subcutaneous 3 times per day  . insulin aspart  0-15 Units Subcutaneous TID WC  . insulin aspart  0-5 Units Subcutaneous QHS  . insulin glargine  15 Units Subcutaneous QHS  . levothyroxine  50 mcg Oral QAC breakfast  . lidocaine  1 patch Transdermal Q24H  . metoprolol succinate  50 mg Oral Daily  . mupirocin ointment  1 application Nasal BID  . nystatin   Topical TID  . sodium chloride  10-40 mL Intracatheter Q12H  . sodium chloride  10-40 mL Intracatheter Q12H    BMET  Component Value Date/Time   NA 139 07/01/2014 0433   K 3.7 07/01/2014 0433   CL 93* 07/01/2014 0433   CO2 31 07/01/2014 0433   GLUCOSE 285* 07/01/2014 0433   BUN 54* 07/01/2014 0433   CREATININE 2.00* 07/01/2014 0433   CALCIUM 9.3 07/01/2014 0433   GFRNONAA 26* 07/01/2014 0433   GFRAA 30* 07/01/2014 0433   CBC    Component Value Date/Time   WBC 8.2 06/29/2014 0440   RBC 3.22* 06/29/2014 0440   RBC 3.12* 05/10/2010 1512   HGB 9.6* 06/29/2014 0440   HCT 29.7* 06/29/2014 0440   PLT 192 06/29/2014 0440   MCV 92.2 06/29/2014 0440   MCH 29.8 06/29/2014 0440   MCHC 32.3 06/29/2014 0440   RDW 16.1* 06/29/2014 0440   LYMPHSABS 0.7 06/23/2014 0500   MONOABS 0.7 06/23/2014 0500   EOSABS 0.1 06/23/2014 0500   BASOSABS 0.0 06/23/2014 0500   Assessment/Plan:  58 y.o. female with PMH morbid obesity, DM, HTN, CAD, COPD, CHF; admitted 9/29 for anasarca and CHF. She has required CVVHD.  1. AKI/CKD in setting of decompensated  diastolic CHF with azotemia consistent with cardiorenal syndrome. Scr stable currently 2.00, not requiring dialysis at this time. 1. Have removed 46.1kg of fluid with IV diuresis and CVVHD/IHD.  2. Discontinue left IJ HD cath. 2. Diastolic CHF- as above, off milrinone  1. Lasix being held; UOP not recorded overnight but made 650cc since start of shift. 3. Anemia- normocytic. Possibly related to CKD or acute illness. Recommend checking iron studies and starting epo. 4. Severe pulmonary venous HTN- as above 5. Hypokalemia- stable/resolved.  6. DM- per primary svc 7. Metabolic alkalosis- improved with CVVHD.  8. Continue PT/encourage OOB with assistance.  [name], Andrew  Renal Attending:  She is doing well and appears to continue to make urine although we are not sure how much.  We will remove dialysis catheter. We will probably resume diuretics in a day or so.  The foley catheter will need to come out soon as well.

## 2014-07-01 NOTE — Progress Notes (Signed)
PROGRESS NOTE  Kimberly [name] YNW:[id number] DOB: Jun 04, 1956 DOA: 06/09/2014 PCP: Ginette OttoSTONEKING,[name] THOMAS, MD  Summary: 58 y.o. F admitted 9/29 with anasarca, acute exacerbation of diastolic heart failure, AKI. Since admission, has required lasix gtt and milrinone per recommendations of HF team - continues to have sluggish diuresis despite multiple meds. On afternoon of 10/12, pt became more lethargic. ABG demonstrated hypercarbic respiratory failure. Pt placed on BiPAP but did not have any improvement. PCCM consulted for Intubation. Required CRRT for neg balance. Now intermittent HD. Weight down 80+ lbs  SIGNIFICANT EVENTS:  9/29 admit  9/30 renal consulted  10/3 renal signoff  10/05 HF team consulted, CVL placed for CVP's and Co-Ox's.  10/06 lasix gtt, milrinone started  10/10 renal re-consulted for sluggish diuresis, AKI.  10/12 PCCM consultation for progressive resp failure, intubated  10/13 in and out PA cath by Dr Gala RomneyBensimhon. Markedly elevated PCWP and PAPs. PAC removed and HD cath placed  10/13 CRRT initiated by Renal  10/14 negative > 5.5 liters over 24 hrs. Passed SBT. RASS 0, CAM-ICU negative. Extubated successfully. CRRT conitnued  10/15 episode of delerium / aphasia  Assessment/Plan: Acute on chronic diastolic CHF (congestive heart failure), NYHA class 1:  - diuresed 80 lbs since admission.  - was on CVVH/UF while in ICU per PCCM - Was on Po lasix, stopped per Renal - creatinine stable and Urine output good  Loose stool: c diff negative -improving  DM (diabetes mellitus), type 2  -uncontrolled.  -increase lantus, stop glucerna  S/P VDRF -wean O2  Essential hypertension  COPD  CKD (chronic kidney disease), stage III  Anasarca  AKI (acute kidney injury)  Unspecified hypothyroidism  Mitral valve stenosis, moderate  Pulmonary HTN  Acute-on-chronic renal failure  Acute on chronic respiratory failure with hypercapnia  Delirium  Cardiorenal syndrome with renal failure    Chronic low back pain: better with lidoderm - palliative following for pain Morbid obesity  Physical deconditioning, severe. Patient was essentially bedbound for several months PTA- patient wants to go to SNF  Code Status: full Family Communication: patient Disposition Plan: SNF once diuresed   Consultants:  PCCM  Renal   Palliative care (pain management)  Procedures: L IJ CVL 10/07 >>  ETT 10/12 >> 10/14  10/13 R IJ HD cath >>  CRRT, HD     HPI/Subjective: Feeling better In better spirits  Objective: Filed Vitals:   07/01/14 1008  BP: 116/42  Pulse: 81  Temp: 97.4 F (36.3 C)  Resp: 16    Intake/Output Summary (Last 24 hours) at 07/01/14 1512 Last data filed at 07/01/14 1300  Gross per 24 hour  Intake    480 ml  Output    650 ml  Net   -170 ml   Filed Weights   06/29/14 0500 06/29/14 2151 06/30/14 2111  Weight: 118.1 kg (260 lb 5.8 oz) 117.3 kg (258 lb 9.6 oz) 115.078 kg (253 lb 11.2 oz)    Exam:   General:  A+Ox3, NAD  HEENT: Central line  Cardiovascular: S1S2/RRR  Respiratory: no wheezing  Abdomen: obese  Musculoskeletal: no edema- LE   Data Reviewed: Basic Metabolic Panel:  Recent Labs Lab 06/25/14 0315  06/26/14 0400 06/27/14 0500 06/28/14 0506 06/29/14 0440 06/30/14 0453 06/30/14 1157 07/01/14 0433  NA 139  < > 139 137 137 140 140  --  139  K 3.7  < > 4.4 4.7 4.0 4.2 3.9  --  3.7  CL 99  < > 101 100 98 100 97  --  93*  CO2 29  < > 26 24 25 26 30   --  31  GLUCOSE 134*  < > 133* 138* 139* 198* 278* 498* 285*  BUN 37*  < > 23 36* 28* 42* 48*  --  54*  CREATININE 1.19*  < > 1.07 1.84* 1.72* 1.88* 1.93*  --  2.00*  CALCIUM 8.8  < > 9.1 10.0 9.4 9.7 9.7  --  9.3  MG 2.2  --  2.4  --   --   --   --   --   --   PHOS 2.4  < > 1.9* 2.3 3.4 4.0 4.4  --  4.1  < > = values in this interval not displayed. Liver Function Tests:  Recent Labs Lab 06/27/14 0500 06/28/14 0506 06/29/14 0440 06/30/14 0453 07/01/14 0433  ALBUMIN  3.4* 3.3* 3.2* 3.4* 3.4*   No results found for this basename: LIPASE, AMYLASE,  in the last 168 hours No results found for this basename: AMMONIA,  in the last 168 hours CBC:  Recent Labs Lab 06/25/14 0315 06/26/14 0400 06/29/14 0440  WBC 14.3* 13.4* 8.2  HGB 8.6* 8.7* 9.6*  HCT 28.3* 28.5* 29.7*  MCV 97.3 96.3 92.2  PLT 198 203 192   Cardiac Enzymes: No results found for this basename: CKTOTAL, CKMB, CKMBINDEX, TROPONINI,  in the last 168 hours BNP (last 3 results)  Recent Labs  09/10/13 1827 10/13/13 1916 06/09/14 1818  PROBNP 4050.0* 1519.0* 4358.0*   CBG:  Recent Labs Lab 06/30/14 1138 06/30/14 1630 06/30/14 2114 07/01/14 0809 07/01/14 1144  GLUCAP 463* 263* 309* 286* 397*    Recent Results (from the past 240 hour(s))  CULTURE, RESPIRATORY (NON-EXPECTORATED)     Status: None   Collection Time    06/22/14  9:20 PM      Result Value Ref Range Status   Specimen Description TRACHEAL ASPIRATE   Final   Special Requests NONE   Final   Gram Stain     Final   Value: MODERATE WBC PRESENT,BOTH PMN AND MONONUCLEAR     RARE SQUAMOUS EPITHELIAL CELLS PRESENT     NO ORGANISMS SEEN     Performed at [facility]   Culture     Final   Value: Non-Pathogenic Oropharyngeal-type Flora Isolated.     Performed at [facility]   Report Status 06/25/2014 FINAL   Final  CLOSTRIDIUM DIFFICILE BY PCR     Status: None   Collection Time    06/29/14  5:12 PM      Result Value Ref Range Status   C difficile by pcr NEGATIVE  NEGATIVE Final     Studies: No results found.  Scheduled Meds: . amLODipine  5 mg Oral Daily  . Brinzolamide-Brimonidine  1 drop Both Eyes BID  . Chlorhexidine Gluconate Cloth  6 each Topical Q0600  . heparin subcutaneous  5,000 Units Subcutaneous 3 times per day  . insulin aspart  0-15 Units Subcutaneous TID WC  . insulin aspart  0-5 Units Subcutaneous QHS  . insulin glargine  22 Units Subcutaneous QHS  . levothyroxine  50 mcg Oral  QAC breakfast  . lidocaine  1 patch Transdermal Q24H  . metoprolol succinate  50 mg Oral Daily  . mupirocin ointment  1 application Nasal BID  . nystatin   Topical TID  . sodium chloride  10-40 mL Intracatheter Q12H  . sodium chloride  10-40 mL Intracatheter Q12H   Continuous Infusions: . sodium chloride Stopped (06/23/14  1856)   Antibiotics Given (last 72 hours)   None      Principal Problem:   Acute on chronic diastolic CHF (congestive heart failure), NYHA class 1 Active Problems:   DM (diabetes mellitus), type 2 with renal complications   HYPERLIPIDEMIA   TOBACCO ABUSE   Essential hypertension   COPD   CKD (chronic kidney disease), stage III   Anasarca   AKI (acute kidney injury)   Unspecified hypothyroidism   Mitral valve stenosis, moderate   Pulmonary HTN   Acute-on-chronic renal failure   Acute on chronic respiratory failure with hypercapnia   Delirium   Cardiorenal syndrome with renal failure   Chronic low back pain   Morbid obesity   Physical deconditioning    Time spent: 25 min    [facility] Surgery Center IncJOSEPH,Romelia Bromell  [facility] Pager [phone]. If 7PM-7AM, please contact night-coverage at [email or web address], password [email or web address] 07/01/2014, 3:12 PM  LOS: 22 days

## 2014-07-02 LAB — CBC
HCT: 30.5 % — ABNORMAL LOW (ref 36.0–46.0)
HEMOGLOBIN: 10 g/dL — AB (ref 12.0–15.0)
MCH: 29.7 pg (ref 26.0–34.0)
MCHC: 32.8 g/dL (ref 30.0–36.0)
MCV: 90.5 fL (ref 78.0–100.0)
Platelets: 173 10*3/uL (ref 150–400)
RBC: 3.37 MIL/uL — ABNORMAL LOW (ref 3.87–5.11)
RDW: 16 % — ABNORMAL HIGH (ref 11.5–15.5)
WBC: 7.3 10*3/uL (ref 4.0–10.5)

## 2014-07-02 LAB — RENAL FUNCTION PANEL
ANION GAP: 13 (ref 5–15)
Albumin: 3.2 g/dL — ABNORMAL LOW (ref 3.5–5.2)
BUN: 54 mg/dL — ABNORMAL HIGH (ref 6–23)
CALCIUM: 9.3 mg/dL (ref 8.4–10.5)
CO2: 30 meq/L (ref 19–32)
Chloride: 92 mEq/L — ABNORMAL LOW (ref 96–112)
Creatinine, Ser: 1.92 mg/dL — ABNORMAL HIGH (ref 0.50–1.10)
GFR calc non Af Amer: 27 mL/min — ABNORMAL LOW (ref >90)
GFR, EST AFRICAN AMERICAN: 32 mL/min — AB (ref >90)
GLUCOSE: 374 mg/dL — AB (ref 70–99)
POTASSIUM: 3.8 meq/L (ref 3.7–5.3)
Phosphorus: 3.9 mg/dL (ref 2.3–4.6)
Sodium: 135 mEq/L — ABNORMAL LOW (ref 137–147)

## 2014-07-02 LAB — GLUCOSE, CAPILLARY
GLUCOSE-CAPILLARY: 318 mg/dL — AB (ref 70–99)
GLUCOSE-CAPILLARY: 375 mg/dL — AB (ref 70–99)
Glucose-Capillary: 360 mg/dL — ABNORMAL HIGH (ref 70–99)
Glucose-Capillary: 292 mg/dL — ABNORMAL HIGH (ref 70–99)

## 2014-07-02 MED ORDER — INSULIN GLARGINE 100 UNIT/ML ~~LOC~~ SOLN
20.0000 [IU] | Freq: Two times a day (BID) | SUBCUTANEOUS | Status: DC
  Administered 2014-07-02 (×2): 20 [IU] via SUBCUTANEOUS
  Filled 2014-07-02 (×3): qty 0.2

## 2014-07-02 MED ORDER — INSULIN ASPART 100 UNIT/ML ~~LOC~~ SOLN
5.0000 [IU] | Freq: Three times a day (TID) | SUBCUTANEOUS | Status: DC
  Administered 2014-07-02 – 2014-07-05 (×9): 5 [IU] via SUBCUTANEOUS

## 2014-07-02 MED ORDER — INSULIN GLARGINE 100 UNIT/ML ~~LOC~~ SOLN
25.0000 [IU] | Freq: Every day | SUBCUTANEOUS | Status: DC
  Filled 2014-07-02: qty 0.25

## 2014-07-02 MED ORDER — TORSEMIDE 20 MG PO TABS
40.0000 mg | ORAL_TABLET | Freq: Two times a day (BID) | ORAL | Status: DC
  Administered 2014-07-02 – 2014-07-05 (×6): 40 mg via ORAL
  Filled 2014-07-02 (×8): qty 2

## 2014-07-02 NOTE — Progress Notes (Signed)
Patient ID: Kimberly ManilaSusan K [name], female   DOB: [birth year], 58 y.o.   MRN: [id number] Advanced Heart Failure Rounding Note   Subjective:     58 y/o admitted with diastolic HD, anasarca and a/c renal failure.   Renal Ultrasound - inconclusive ECHO 06/10/14 EF 55-60% Grade II DD MV moderate stenosis. Peak PA pressure 77  Off dialysis and diuretics. I/O -1L but weight up 2-3 pounds. Feels ok. No dyspnea. Renal has signed off.    Objective:   Weight Range:  Vital Signs:   Temp:  [97.7 F (36.5 C)-99 F (37.2 C)] 99 F (37.2 C) (10/22 1100) Pulse Rate:  [80-88] 88 (10/22 1100) Resp:  [16-17] 17 (10/22 1100) BP: (118-148)/(39-79) 143/79 mmHg (10/22 1100) SpO2:  [98 %-100 %] 99 % (10/22 1100) Weight:  [116.07 kg (255 lb 14.2 oz)] 116.07 kg (255 lb 14.2 oz) (10/21 2144) Last BM Date: 06/29/14  Weight change: Filed Weights   06/29/14 2151 06/30/14 2111 07/01/14 2144  Weight: 117.3 kg (258 lb 9.6 oz) 115.078 kg (253 lb 11.2 oz) 116.07 kg (255 lb 14.2 oz)    Intake/Output:   Intake/Output Summary (Last 24 hours) at 07/02/14 1252 Last data filed at 07/02/14 1100  Gross per 24 hour  Intake    720 ml  Output   1251 ml  Net   -531 ml     Physical Exam:  General: Chronically ill appearing. No distress HEENT: normal. Neck: supple. Carotids 2+ bilat; no bruits. No lymphadenopathy or thryomegaly appreciated. RIJ  Cor: PMI nondisplaced. Regular rate & rhythm. No rubs, gallops or murmurs. Lungs: clear Abdomen: obese, nontender, nondistended. No hepatosplenomegaly. No bruits or masses. Good bowel sounds. Extremities: no cyanosis, clubbing, rash, R and LLE trace edema  Neuro:  Pleasant. Nonfocal.   Telemetry: SR 80-90s   Labs: Basic Metabolic Panel:  Recent Labs Lab 06/26/14 0400  06/28/14 0506 06/29/14 0440 06/30/14 0453 06/30/14 1157 07/01/14 0433 07/02/14 0537  NA 139  < > 137 140 140  --  139 135*  K 4.4  < > 4.0 4.2 3.9  --  3.7 3.8  CL 101  < > 98 100 97  --  93* 92*  CO2  26  < > 25 26 30   --  31 30  GLUCOSE 133*  < > 139* 198* 278* 498* 285* 374*  BUN 23  < > 28* 42* 48*  --  54* 54*  CREATININE 1.07  < > 1.72* 1.88* 1.93*  --  2.00* 1.92*  CALCIUM 9.1  < > 9.4 9.7 9.7  --  9.3 9.3  MG 2.4  --   --   --   --   --   --   --   PHOS 1.9*  < > 3.4 4.0 4.4  --  4.1 3.9  < > = values in this interval not displayed.  Liver Function Tests:  Recent Labs Lab 06/28/14 0506 06/29/14 0440 06/30/14 0453 07/01/14 0433 07/02/14 0537  ALBUMIN 3.3* 3.2* 3.4* 3.4* 3.2*   No results found for this basename: LIPASE, AMYLASE,  in the last 168 hours No results found for this basename: AMMONIA,  in the last 168 hours  CBC:  Recent Labs Lab 06/26/14 0400 06/29/14 0440 07/02/14 0537  WBC 13.4* 8.2 7.3  HGB 8.7* 9.6* 10.0*  HCT 28.5* 29.7* 30.5*  MCV 96.3 92.2 90.5  PLT 203 192 173    Cardiac Enzymes: No results found for this basename: CKTOTAL, CKMB, CKMBINDEX, TROPONINI,  in  the last 168 hours  BNP: BNP (last 3 results)  Recent Labs  09/10/13 1827 10/13/13 1916 06/09/14 1818  PROBNP 4050.0* 1519.0* 4358.0*     Other results:    Imaging: No results found.   Medications:     Scheduled Medications: . amLODipine  5 mg Oral Daily  . Brinzolamide-Brimonidine  1 drop Both Eyes BID  . Chlorhexidine Gluconate Cloth  6 each Topical Q0600  . heparin subcutaneous  5,000 Units Subcutaneous 3 times per day  . insulin aspart  0-15 Units Subcutaneous TID WC  . insulin aspart  0-5 Units Subcutaneous QHS  . insulin aspart  5 Units Subcutaneous TID WC  . insulin glargine  20 Units Subcutaneous BID  . levothyroxine  50 mcg Oral QAC breakfast  . lidocaine  1 patch Transdermal Q24H  . metoprolol succinate  50 mg Oral Daily  . mupirocin ointment  1 application Nasal BID  . nystatin   Topical TID  . sodium chloride  10-40 mL Intracatheter Q12H  . sodium chloride  10-40 mL Intracatheter Q12H    Infusions: . sodium chloride Stopped (06/23/14 1856)     PRN Medications: sodium chloride, sodium chloride, cyclobenzaprine, feeding supplement (NEPRO CARB STEADY), fentaNYL, ipratropium-albuterol, lidocaine (PF), lidocaine-prilocaine, ondansetron, pentafluoroprop-tetrafluoroeth, RESOURCE THICKENUP CLEAR, simethicone, sodium chloride, sorbitol   Assessment:  1. A/C Diastolic HF ECHO EF 55-60%  2. Mitral Valve Stenosis 3. Obesity 4. DM --> Hgb A1C 7 5. Cardio Renal Syndrome 6. Hypothyroidism --> TSH 18.36  7. Immobility  8. Pulmonary venous HTN  9. Acute hypercarbic resp failure - 10. AKI, on CVVHD 11. Hypokalemia 12. AMS - CT head (10/15) no stroke 13. Anemia 14. Deconditioning   Plan/Discussion:    Renal function improved. No longer requiring HD for now. She is off diuretics but will likely need them restarted. Renal has not left home diuretic recommendations. Suspect we should start with torsemide 40 bid tomorrow.  Would not send her to SNF until we have stable oral diuretic regimen.   [name]. Daniel Bensimhon, MD 12:52 PM  Advanced Heart Failure Team Pager [phone] (M-F; 7a - 4p)  Please contact [facility] for night-coverage after hours (4p -7a ) and weekends on [email or web address]

## 2014-07-02 NOTE — Progress Notes (Signed)
CARE MANAGEMENT NOTE 07/02/2014  Patient:  [name],Francisca K   Account Number:  [id number]  Date Initiated:  06/10/2014  Documentation initiated by:  ROBARGE,COURTNEY  Subjective/Objective Assessment:   Patient was admitted with anasarca, chf, aki, acute respiratory failure.  Lives at home with spouse.     Action/Plan:   Will follow for discharge needs   Anticipated DC Date:  07/03/2014   Anticipated DC Plan:  SKILLED NURSING FACILITY  In-house referral  Clinical Social Worker      DC Planning Services  CM consult      Choice offered to / List presented to:             Status of service:  Completed, signed off Medicare Important Message given?  YES (If response is "NO", the following Medicare IM given date fields will be blank) Date Medicare IM given:  06/15/2014 Medicare IM given by:  [facility],CAMELLIA Date Additional Medicare IM given:  07/02/2014 Additional Medicare IM given by:  Isidoro DonningALESIA Eliazar Olivar  Discharge Disposition:  SKILLED NURSING FACILITY  Per UR Regulation:  Reviewed for med. necessity/level of care/duration of stay  If discussed at Long Length of Stay Meetings, dates discussed:   06/16/2014  06/18/2014  06/23/2014  06/25/2014    Comments:  07/02/2014 1500 Pt medically ready for dc, waiting approval from insurance for SNF. CSW following for placement. Isidoro DonningAlesia Elroy Schembri RN CCM Case Mgmt phone [phone]  10/13   1127 debbie dowell rn,bsn pt was intubated on 10-12. will cont to follow for ltac vs snf dep on how pt progresses.  06/15/14 1500 Camellia Wood, RN, BSN, UtahNCM [phone] HF Team requested to assist with worsening renal function and massive volume overload. Diuretics have been held. GIven 500 cc NS bolus today. Will move to stepdown and place central access to further guide therapy. Start lasix drip 10 mg per hour with 80 mg bolus.

## 2014-07-02 NOTE — Progress Notes (Signed)
Patient ID: Kimberly [name], female   DOB: 21-Sep-1955, 58 y.o.   MRN: [id number] S: Feels good this morning, continues to have "rattle" in chest. Overall breathing okay.  O:BP 137/39  Pulse 81  Temp(Src) 98.5 F (36.9 C) (Oral)  Resp 16  Ht 5\' 4"  (1.626 m)  Wt 255 lb 14.2 oz (116.07 kg)  BMI 43.90 kg/m2  SpO2 98%  Intake/Output Summary (Last 24 hours) at 07/02/14 0756 Last data filed at 07/02/14 0640  Gross per 24 hour  Intake    720 ml  Output   1701 ml  Net   -981 ml   Intake/Output: I/O last 3 completed shifts: In: 720 [P.O.:720] Out: 1701 [Urine:1700; Stool:1]  Intake/Output this shift:    Weight change: 2 lb 3 oz (0.993 kg)  Gen:NAD CVS: RRR, no m/r/g Resp: mild dependent crackles at the bases. Abd: obese, soft, +BS Ext: LE dry/nonedematous.    Recent Labs Lab 06/26/14 0400 06/27/14 0500 06/28/14 0506 06/29/14 0440 06/30/14 0453 06/30/14 1157 07/01/14 0433 07/02/14 0537  NA 139 137 137 140 140  --  139 135*  K 4.4 4.7 4.0 4.2 3.9  --  3.7 3.8  CL 101 100 98 100 97  --  93* 92*  CO2 26 24 25 26 30   --  31 30  GLUCOSE 133* 138* 139* 198* 278* 498* 285* 374*  BUN 23 36* 28* 42* 48*  --  54* 54*  CREATININE 1.07 1.84* 1.72* 1.88* 1.93*  --  2.00* 1.92*  ALBUMIN 3.1* 3.4* 3.3* 3.2* 3.4*  --  3.4* 3.2*  CALCIUM 9.1 10.0 9.4 9.7 9.7  --  9.3 9.3  PHOS 1.9* 2.3 3.4 4.0 4.4  --  4.1 3.9   Liver Function Tests:  Recent Labs Lab 06/30/14 0453 07/01/14 0433 07/02/14 0537  ALBUMIN 3.4* 3.4* 3.2*   No results found for this basename: LIPASE, AMYLASE,  in the last 168 hours No results found for this basename: AMMONIA,  in the last 168 hours CBC:  Recent Labs Lab 06/26/14 0400 06/29/14 0440 07/02/14 0537  WBC 13.4* 8.2 7.3  HGB 8.7* 9.6* 10.0*  HCT 28.5* 29.7* 30.5*  MCV 96.3 92.2 90.5  PLT 203 192 173   Cardiac Enzymes: No results found for this basename: CKTOTAL, CKMB, CKMBINDEX, TROPONINI,  in the last 168 hours CBG:  Recent Labs Lab  06/30/14 2114 07/01/14 0809 07/01/14 1144 07/01/14 1641 07/01/14 2143  GLUCAP 309* 286* 397* 285* 312*    Iron Studies: No results found for this basename: IRON, TIBC, TRANSFERRIN, FERRITIN,  in the last 72 hours Studies/Results: No results found. Marland Kitchen. amLODipine  5 mg Oral Daily  . Brinzolamide-Brimonidine  1 drop Both Eyes BID  . Chlorhexidine Gluconate Cloth  6 each Topical Q0600  . heparin subcutaneous  5,000 Units Subcutaneous 3 times per day  . insulin aspart  0-15 Units Subcutaneous TID WC  . insulin aspart  0-5 Units Subcutaneous QHS  . insulin glargine  22 Units Subcutaneous QHS  . levothyroxine  50 mcg Oral QAC breakfast  . lidocaine  1 patch Transdermal Q24H  . metoprolol succinate  50 mg Oral Daily  . mupirocin ointment  1 application Nasal BID  . nystatin   Topical TID  . sodium chloride  10-40 mL Intracatheter Q12H  . sodium chloride  10-40 mL Intracatheter Q12H    BMET    Component Value Date/Time   NA 135* 07/02/2014 0537   K 3.8 07/02/2014 0537   CL  92* 07/02/2014 0537   CO2 30 07/02/2014 0537   GLUCOSE 374* 07/02/2014 0537   BUN 54* 07/02/2014 0537   CREATININE 1.92* 07/02/2014 0537   CALCIUM 9.3 07/02/2014 0537   GFRNONAA 27* 07/02/2014 0537   GFRAA 32* 07/02/2014 0537   CBC    Component Value Date/Time   WBC 7.3 07/02/2014 0537   RBC 3.37* 07/02/2014 0537   RBC 3.12* 05/10/2010 1512   HGB 10.0* 07/02/2014 0537   HCT 30.5* 07/02/2014 0537   PLT 173 07/02/2014 0537   MCV 90.5 07/02/2014 0537   MCH 29.7 07/02/2014 0537   MCHC 32.8 07/02/2014 0537   RDW 16.0* 07/02/2014 0537   LYMPHSABS 0.7 06/23/2014 0500   MONOABS 0.7 06/23/2014 0500   EOSABS 0.1 06/23/2014 0500   BASOSABS 0.0 06/23/2014 0500   Assessment/Plan:  58 y.o. female with PMH morbid obesity, DM, HTN, CAD, COPD, CHF; admitted 9/29 for anasarca and CHF. She has required CVVHD.  1. AKI/CKD in setting of decompensated diastolic CHF with azotemia consistent with cardiorenal syndrome.  Scr stable around 2, not requiring dialysis at this time. 1. Have removed 46.9kg of fluid with IV diuresis and CVVHD/IHD.  2. Diastolic CHF- as above, off milrinone  1. Lasix being held; UOP adequate off diuretics, 1.7L last 24hrs. Foley catheter can be removed today. 3. Anemia- normocytic. Possibly related to CKD or acute illness. Stable. 4. Severe pulmonary venous HTN- as above 5. Hypokalemia- stable/resolved.  6. DM- per primary svc 7. Metabolic alkalosis- improved with CVVHD.  8. Continue PT/encourage OOB with assistance.  Renal function stable currently near baseline. Will need diuretics reinitiated eventually, though still making good UOP now. Will sign off now, if needed please re-consult.  [name], Andrew  Renal Attending: She has improved remarkably with volume status.  She is currently not receiving diuretics but anticipate the need in the future.  Renal function is stable and we do not anticipate the need for another dialysis in the near future.  We will remove dialysis catheter and sign off.  She sees Dr. Pete GlatterStoneking but we will be happy to see as an outpt if needed.  Valerye Kobus C

## 2014-07-02 NOTE — Progress Notes (Signed)
PROGRESS NOTE  Kimberly [name] QMV:[id number] DOB: December 14, 1955 DOA: 06/09/2014 PCP: Ginette OttoSTONEKING,[name] THOMAS, MD  Summary: 58 y.o. F admitted 9/29 with anasarca, acute exacerbation of diastolic heart failure, AKI. Since admission, has required lasix gtt and milrinone per recommendations of HF team - continues to have sluggish diuresis despite multiple meds. On afternoon of 10/12, pt became more lethargic. ABG demonstrated hypercarbic respiratory failure. Pt placed on BiPAP but did not have any improvement. PCCM consulted for Intubation. Required CRRT for neg balance. Now intermittent HD. Weight down 80+ lbs  SIGNIFICANT EVENTS:  9/29 admit  9/30 renal consulted  10/3 renal signoff  10/05 HF team consulted, CVL placed for CVP's and Co-Ox's.  10/06 lasix gtt, milrinone started  10/10 renal re-consulted for sluggish diuresis, AKI.  10/12 PCCM consultation for progressive resp failure, intubated  10/13 in and out PA cath by Dr Gala RomneyBensimhon. Markedly elevated PCWP and PAPs. PAC removed and HD cath placed  10/13 CRRT initiated by Renal  10/14 negative > 5.5 liters over 24 hrs. Passed SBT. RASS 0, CAM-ICU negative. Extubated successfully. CRRT conitnued  10/15 episode of delerium / aphasia  Assessment/Plan: Acute on chronic diastolic CHF (congestive heart failure), NYHA class 1:  - diuresed 80 lbs since admission.  - was on CVVH/UF while in ICU per PCCM - Was on Po lasix, stopped per Renal - creatinine stable and Urine output good - might need lasix 3-4 days down the road once she stops autodiuresing -Dc foley, remove central line tomorrow -needs close FU with Renal  Loose stool: c diff negative -improving  DM (diabetes mellitus), type 2  -uncontrolled.  -increase lantus, stop glucerna  S/P VDRF -wean O2  Essential hypertension  COPD  CKD (chronic kidney disease), stage III  Anasarca  AKI (acute kidney injury)  Unspecified hypothyroidism  Mitral valve stenosis, moderate  Pulmonary HTN    Acute-on-chronic renal failure  Acute on chronic respiratory failure with hypercapnia  Delirium  Cardiorenal syndrome with renal failure  Chronic low back pain: better with lidoderm - palliative following for pain Morbid obesity  Physical deconditioning, severe. Patient was essentially bedbound for several months PTA- patient wants to go to SNF  Code Status: full Family Communication: patient Disposition Plan: SNF when stable per Renal   Consultants:  PCCM  Renal   Palliative care (pain management)  Procedures: L IJ CVL 10/07 >>  ETT 10/12 >> 10/14  10/13 R IJ HD cath >>  CRRT, HD     HPI/Subjective: Feeling better In better spirits  Objective: Filed Vitals:   07/02/14 1100  BP: 143/79  Pulse: 88  Temp: 99 F (37.2 C)  Resp: 17    Intake/Output Summary (Last 24 hours) at 07/02/14 1325 Last data filed at 07/02/14 1100  Gross per 24 hour  Intake    600 ml  Output   1251 ml  Net   -651 ml   Filed Weights   06/29/14 2151 06/30/14 2111 07/01/14 2144  Weight: 117.3 kg (258 lb 9.6 oz) 115.078 kg (253 lb 11.2 oz) 116.07 kg (255 lb 14.2 oz)    Exam:   General:  A+Ox3, NAD  HEENT: Central line  Cardiovascular: S1S2/RRR  Respiratory: no wheezing  Abdomen: obese  Musculoskeletal: no edema- LE   Data Reviewed: Basic Metabolic Panel:  Recent Labs Lab 06/26/14 0400  06/28/14 0506 06/29/14 0440 06/30/14 0453 06/30/14 1157 07/01/14 0433 07/02/14 0537  NA 139  < > 137 140 140  --  139 135*  K 4.4  < >  4.0 4.2 3.9  --  3.7 3.8  CL 101  < > 98 100 97  --  93* 92*  CO2 26  < > 25 26 30   --  31 30  GLUCOSE 133*  < > 139* 198* 278* 498* 285* 374*  BUN 23  < > 28* 42* 48*  --  54* 54*  CREATININE 1.07  < > 1.72* 1.88* 1.93*  --  2.00* 1.92*  CALCIUM 9.1  < > 9.4 9.7 9.7  --  9.3 9.3  MG 2.4  --   --   --   --   --   --   --   PHOS 1.9*  < > 3.4 4.0 4.4  --  4.1 3.9  < > = values in this interval not displayed. Liver Function Tests:  Recent  Labs Lab 06/28/14 0506 06/29/14 0440 06/30/14 0453 07/01/14 0433 07/02/14 0537  ALBUMIN 3.3* 3.2* 3.4* 3.4* 3.2*   No results found for this basename: LIPASE, AMYLASE,  in the last 168 hours No results found for this basename: AMMONIA,  in the last 168 hours CBC:  Recent Labs Lab 06/26/14 0400 06/29/14 0440 07/02/14 0537  WBC 13.4* 8.2 7.3  HGB 8.7* 9.6* 10.0*  HCT 28.5* 29.7* 30.5*  MCV 96.3 92.2 90.5  PLT 203 192 173   Cardiac Enzymes: No results found for this basename: CKTOTAL, CKMB, CKMBINDEX, TROPONINI,  in the last 168 hours BNP (last 3 results)  Recent Labs  09/10/13 1827 10/13/13 1916 06/09/14 1818  PROBNP 4050.0* 1519.0* 4358.0*   CBG:  Recent Labs Lab 07/01/14 0809 07/01/14 1144 07/01/14 1641 07/01/14 2143 07/02/14 0857  GLUCAP 286* 397* 285* 312* 318*    Recent Results (from the past 240 hour(s))  CULTURE, RESPIRATORY (NON-EXPECTORATED)     Status: None   Collection Time    06/22/14  9:20 PM      Result Value Ref Range Status   Specimen Description TRACHEAL ASPIRATE   Final   Special Requests NONE   Final   Gram Stain     Final   Value: MODERATE WBC PRESENT,BOTH PMN AND MONONUCLEAR     RARE SQUAMOUS EPITHELIAL CELLS PRESENT     NO ORGANISMS SEEN     Performed at [facility]   Culture     Final   Value: Non-Pathogenic Oropharyngeal-type Flora Isolated.     Performed at [facility]   Report Status 06/25/2014 FINAL   Final  CLOSTRIDIUM DIFFICILE BY PCR     Status: None   Collection Time    06/29/14  5:12 PM      Result Value Ref Range Status   C difficile by pcr NEGATIVE  NEGATIVE Final     Studies: No results found.  Scheduled Meds: . amLODipine  5 mg Oral Daily  . Brinzolamide-Brimonidine  1 drop Both Eyes BID  . Chlorhexidine Gluconate Cloth  6 each Topical Q0600  . heparin subcutaneous  5,000 Units Subcutaneous 3 times per day  . insulin aspart  0-15 Units Subcutaneous TID WC  . insulin aspart  0-5 Units  Subcutaneous QHS  . insulin aspart  5 Units Subcutaneous TID WC  . insulin glargine  20 Units Subcutaneous BID  . levothyroxine  50 mcg Oral QAC breakfast  . lidocaine  1 patch Transdermal Q24H  . metoprolol succinate  50 mg Oral Daily  . mupirocin ointment  1 application Nasal BID  . nystatin   Topical TID  . sodium  chloride  10-40 mL Intracatheter Q12H  . sodium chloride  10-40 mL Intracatheter Q12H  . torsemide  40 mg Oral BID   Continuous Infusions: . sodium chloride Stopped (06/23/14 1856)   Antibiotics Given (last 72 hours)   None      Principal Problem:   Acute on chronic diastolic CHF (congestive heart failure), NYHA class 1 Active Problems:   DM (diabetes mellitus), type 2 with renal complications   HYPERLIPIDEMIA   TOBACCO ABUSE   Essential hypertension   COPD   CKD (chronic kidney disease), stage III   Anasarca   AKI (acute kidney injury)   Unspecified hypothyroidism   Mitral valve stenosis, moderate   Pulmonary HTN   Acute-on-chronic renal failure   Acute on chronic respiratory failure with hypercapnia   Delirium   Cardiorenal syndrome with renal failure   Chronic low back pain   Morbid obesity   Physical deconditioning    Time spent: 25 min    [facility] Surgery CenterJOSEPH,Witt Plitt  [facility] Pager [phone]. If 7PM-7AM, please contact night-coverage at [email or web address], password [email or web address] 07/02/2014, 1:25 PM  LOS: 23 days

## 2014-07-03 LAB — RENAL FUNCTION PANEL
ALBUMIN: 3.4 g/dL — AB (ref 3.5–5.2)
Anion gap: 14 (ref 5–15)
BUN: 59 mg/dL — ABNORMAL HIGH (ref 6–23)
CALCIUM: 9.6 mg/dL (ref 8.4–10.5)
CO2: 31 mEq/L (ref 19–32)
CREATININE: 1.97 mg/dL — AB (ref 0.50–1.10)
Chloride: 90 mEq/L — ABNORMAL LOW (ref 96–112)
GFR calc Af Amer: 31 mL/min — ABNORMAL LOW (ref >90)
GFR, EST NON AFRICAN AMERICAN: 27 mL/min — AB (ref >90)
Glucose, Bld: 301 mg/dL — ABNORMAL HIGH (ref 70–99)
Phosphorus: 3.6 mg/dL (ref 2.3–4.6)
Potassium: 3.7 mEq/L (ref 3.7–5.3)
Sodium: 135 mEq/L — ABNORMAL LOW (ref 137–147)

## 2014-07-03 LAB — GLUCOSE, CAPILLARY
GLUCOSE-CAPILLARY: 365 mg/dL — AB (ref 70–99)
Glucose-Capillary: 315 mg/dL — ABNORMAL HIGH (ref 70–99)
Glucose-Capillary: 375 mg/dL — ABNORMAL HIGH (ref 70–99)
Glucose-Capillary: 333 mg/dL — ABNORMAL HIGH (ref 70–99)

## 2014-07-03 MED ORDER — INSULIN GLARGINE 100 UNIT/ML ~~LOC~~ SOLN
40.0000 [IU] | Freq: Two times a day (BID) | SUBCUTANEOUS | Status: DC
  Administered 2014-07-03 (×2): 40 [IU] via SUBCUTANEOUS
  Filled 2014-07-03 (×4): qty 0.4

## 2014-07-03 MED ORDER — NEPRO/CARBSTEADY PO LIQD
237.0000 mL | Freq: Two times a day (BID) | ORAL | Status: DC
  Administered 2014-07-03 – 2014-07-05 (×4): 237 mL via ORAL

## 2014-07-03 MED ORDER — INSULIN GLARGINE 100 UNIT/ML ~~LOC~~ SOLN
30.0000 [IU] | Freq: Two times a day (BID) | SUBCUTANEOUS | Status: DC
  Filled 2014-07-03 (×2): qty 0.3

## 2014-07-03 MED ORDER — TORSEMIDE 20 MG PO TABS
40.0000 mg | ORAL_TABLET | Freq: Two times a day (BID) | ORAL | Status: DC
  Filled 2014-07-03 (×2): qty 2

## 2014-07-03 NOTE — Progress Notes (Signed)
Patient ID: Kimberly [name], female   DOB: 10-07-1955, 58 y.o.   MRN: [id number] Advanced Heart Failure Rounding Note   Subjective:     58 y/o admitted with diastolic HD, anasarca and a/c renal failure.   Renal Ultrasound - inconclusive ECHO 06/10/14 EF 55-60% Grade II DD MV moderate stenosis. Peak PA pressure 77  Off dialysis and diuretics. I/O +  but weight up 2-3 pounds. Feels ok. No dyspnea. Renal has signed off. BUN and Cr climbing again.    Objective:   Weight Range:  Vital Signs:   Temp:  [97.8 F (36.6 C)-98.9 F (37.2 C)] 97.8 F (36.6 C) (10/23 1012) Pulse Rate:  [79-92] 92 (10/23 1012) Resp:  [16-20] 20 (10/23 1012) BP: (115-150)/(37-62) 115/37 mmHg (10/23 1012) SpO2:  [99 %-100 %] 100 % (10/23 1012) Last BM Date: 07/02/14  Weight change: Filed Weights   06/29/14 2151 06/30/14 2111 07/01/14 2144  Weight: 117.3 kg (258 lb 9.6 oz) 115.078 kg (253 lb 11.2 oz) 116.07 kg (255 lb 14.2 oz)    Intake/Output:   Intake/Output Summary (Last 24 hours) at 07/03/14 1216 Last data filed at 07/03/14 1013  Gross per 24 hour  Intake    990 ml  Output    475 ml  Net    515 ml     Physical Exam:  General: Chronically ill appearing. No distress HEENT: normal. Neck: supple. Carotids 2+ bilat; no bruits. No lymphadenopathy or thryomegaly appreciated. RIJ  Cor: PMI nondisplaced. Regular rate & rhythm. No rubs, gallops or murmurs. Lungs: clear Abdomen: obese, nontender, nondistended. No hepatosplenomegaly. No bruits or masses. Good bowel sounds. Extremities: no cyanosis, clubbing, rash, R and LLE trace edema  Neuro:  Pleasant. Nonfocal.   Telemetry: SR 80-90s   Labs: Basic Metabolic Panel:  Recent Labs Lab 06/29/14 0440 06/30/14 0453 06/30/14 1157 07/01/14 0433 07/02/14 0537 07/03/14 0515  NA 140 140  --  139 135* 135*  K 4.2 3.9  --  3.7 3.8 3.7  CL 100 97  --  93* 92* 90*  CO2 26 30  --  31 30 31   GLUCOSE 198* 278* 498* 285* 374* 301*  BUN 42* 48*  --  54* 54*  59*  CREATININE 1.88* 1.93*  --  2.00* 1.92* 1.97*  CALCIUM 9.7 9.7  --  9.3 9.3 9.6  PHOS 4.0 4.4  --  4.1 3.9 3.6    Liver Function Tests:  Recent Labs Lab 06/29/14 0440 06/30/14 0453 07/01/14 0433 07/02/14 0537 07/03/14 0515  ALBUMIN 3.2* 3.4* 3.4* 3.2* 3.4*   No results found for this basename: LIPASE, AMYLASE,  in the last 168 hours No results found for this basename: AMMONIA,  in the last 168 hours  CBC:  Recent Labs Lab 06/29/14 0440 07/02/14 0537  WBC 8.2 7.3  HGB 9.6* 10.0*  HCT 29.7* 30.5*  MCV 92.2 90.5  PLT 192 173    Cardiac Enzymes: No results found for this basename: CKTOTAL, CKMB, CKMBINDEX, TROPONINI,  in the last 168 hours  BNP: BNP (last 3 results)  Recent Labs  09/10/13 1827 10/13/13 1916 06/09/14 1818  PROBNP 4050.0* 1519.0* 4358.0*     Other results:    Imaging: No results found.   Medications:     Scheduled Medications: . amLODipine  5 mg Oral Daily  . Brinzolamide-Brimonidine  1 drop Both Eyes BID  . Chlorhexidine Gluconate Cloth  6 each Topical Q0600  . heparin subcutaneous  5,000 Units Subcutaneous 3 times per day  .  insulin aspart  0-15 Units Subcutaneous TID WC  . insulin aspart  0-5 Units Subcutaneous QHS  . insulin aspart  5 Units Subcutaneous TID WC  . insulin glargine  40 Units Subcutaneous BID  . levothyroxine  50 mcg Oral QAC breakfast  . lidocaine  1 patch Transdermal Q24H  . metoprolol succinate  50 mg Oral Daily  . mupirocin ointment  1 application Nasal BID  . nystatin   Topical TID  . sodium chloride  10-40 mL Intracatheter Q12H  . torsemide  40 mg Oral BID    Infusions:    PRN Medications: sodium chloride, sodium chloride, cyclobenzaprine, feeding supplement (NEPRO CARB STEADY), fentaNYL, ipratropium-albuterol, lidocaine (PF), lidocaine-prilocaine, ondansetron, pentafluoroprop-tetrafluoroeth, RESOURCE THICKENUP CLEAR, simethicone, sodium chloride, sorbitol   Assessment:  1. A/C Diastolic HF  ECHO EF 55-60%  2. Mitral Valve Stenosis 3. Obesity 4. DM --> Hgb A1C 7 5. Cardio Renal Syndrome 6. Hypothyroidism --> TSH 18.36  7. Immobility  8. Pulmonary venous HTN  9. Acute hypercarbic resp failure - 10. AKI, on CVVHD 11. Hypokalemia 12. AMS - CT head (10/15) no stroke 13. Anemia 14. Deconditioning   Plan/Discussion:    No longer requiring HD for now. She is off diuretics but starting to regain volume. BUN/CR climbing slowly.   Will start torsemide 40 bid. Will need to make sure this is adequate but be careful not to overdiurese. Would not send her to SNF until we have stable oral diuretic regimen and we are sure renal function is not getting worse.  [name]. Daniel Bensimhon, MD 12:16 PM  Advanced Heart Failure Team Pager [phone] (M-F; 7a - 4p)  Please contact [facility] for night-coverage after hours (4p -7a ) and weekends on [email or web address]

## 2014-07-03 NOTE — Progress Notes (Signed)
Physical Therapy Treatment Patient Details Name: Kimberly ManilaSusan K [name] MRN: [id number] DOB: [birth year] Today's Date: 07/03/2014    History of Present Illness Pt presents with Anasarca and SOB. Pt with progressive respiratory failure and was intubated on 10/12, extubated 10/14.Pt presents with Anasarca and SOB. Pt with progressive respiratory failure and was intubated on 10/12, extubated 10/14. Pt has been on temporary dialysis.    PT Comments    Pt progressing towards physical therapy goals. Was able to ambulate this session for the first time with PT since admission. Discussed general safety awareness as pt continues to be very anxious and fearful of falling. Will continue to progress as able per POC, and continue to plan for SNF at d/c for post-acute rehab.   Follow Up Recommendations  SNF;Supervision for mobility/OOB     Equipment Recommendations  None recommended by PT    Recommendations for Other Services       Precautions / Restrictions Precautions Precautions: Fall Precaution Comments: O2 dependent at home.   Restrictions Weight Bearing Restrictions: No    Mobility  Bed Mobility Overal bed mobility: Needs Assistance Bed Mobility: Supine to Sit     Supine to sit: Supervision;HOB elevated     General bed mobility comments: Pt able to transition to EOB without physical assistance. Supervision for safety.   Transfers Overall transfer level: Needs assistance Equipment used: Rolling walker (2 wheeled) Transfers: Sit to/from Stand Sit to Stand: Min assist;+2 physical assistance;+2 safety/equipment         General transfer comment: Pt very anxious with any mobility. Requires +2 assist more for safety and pt comfort than for physical assist.   Ambulation/Gait Ambulation/Gait assistance: Min guard Ambulation Distance (Feet): 5 Feet Assistive device: Rolling walker (2 wheeled) Gait Pattern/deviations: Step-through pattern;Decreased stride length;Trendelenburg;Trunk  flexed Gait velocity: Decreased Gait velocity interpretation: Below normal speed for age/gender General Gait Details: Pt ambulated in a straight line and chair was pulled up behind her. Feel that pt could have gone farther, however pt very anxious and fearful of falling.    Stairs            Wheelchair Mobility    Modified Rankin (Stroke Patients Only)       Balance Overall balance assessment: Needs assistance Sitting-balance support: Feet supported;No upper extremity supported Sitting balance-Leahy Scale: Fair     Standing balance support: Bilateral upper extremity supported;During functional activity Standing balance-Leahy Scale: Poor                      Cognition Arousal/Alertness: Awake/alert Behavior During Therapy: Anxious Overall Cognitive Status: Within Functional Limits for tasks assessed                      Exercises      General Comments        Pertinent Vitals/Pain Pain Assessment: Faces Faces Pain Scale: Hurts a little bit Pain Location: back Pain Descriptors / Indicators: Aching Pain Intervention(s): Monitored during session    Home Living                      Prior Function            PT Goals (current goals can now be found in the care plan section) Acute Rehab PT Goals Patient Stated Goal: To get stronger - states she has been doing her HEP PT Goal Formulation: With patient Time For Goal Achievement: 07/03/14 Potential to Achieve Goals: Fair Progress towards PT goals:  Progressing toward goals    Frequency  Min 2X/week    PT Plan Frequency needs to be updated    Co-evaluation             End of Session Equipment Utilized During Treatment: Gait belt;Oxygen Activity Tolerance: Patient tolerated treatment well Patient left: in chair;with call bell/phone within reach;with nursing/sitter in room     Time: [id number] PT Time Calculation (min): 30 min  Charges:  $Gait Training: 8-22  mins $Therapeutic Activity: 8-22 mins                    G Codes:      Conni SlipperKirkman, Aella Ronda 07/03/2014, 1:24 PM  Conni SlipperLaura Grantham Hippert, PT, DPT Acute Rehabilitation Services Pager: [phone]

## 2014-07-03 NOTE — Progress Notes (Signed)
PROGRESS NOTE  Kimberly ManilaSusan K [name] HQI:[id number] DOB: [birth year] DOA: 06/09/2014 PCP: Ginette OttoSTONEKING,[name] THOMAS, MD  Summary: 58 y.o. F admitted 9/29 with anasarca, acute exacerbation of diastolic heart failure, AKI. Since admission, has required lasix gtt and milrinone per recommendations of HF team - continues to have sluggish diuresis despite multiple meds. On afternoon of 10/12, pt became more lethargic. ABG demonstrated hypercarbic respiratory failure. Pt placed on BiPAP but did not have any improvement. PCCM consulted for Intubation. Required CRRT for neg balance. Now intermittent HD. Weight down 80+ lbs  SIGNIFICANT EVENTS:  9/29 admit  9/30 renal consulted  10/3 renal signoff  10/05 HF team consulted, CVL placed for CVP's and Co-Ox's.  10/06 lasix gtt, milrinone started  10/10 renal re-consulted for sluggish diuresis, AKI.  10/12 PCCM consultation for progressive resp failure, intubated  10/13 in and out PA cath by Dr Gala RomneyBensimhon. Markedly elevated PCWP and PAPs. PAC removed and HD cath placed  10/13 CRRT initiated by Renal  10/14 negative > 5.5 liters over 24 hrs. Passed SBT. RASS 0, CAM-ICU negative. Extubated successfully. CRRT conitnued  10/15 episode of delerium / aphasia  Assessment/Plan: Acute on chronic diastolic CHF (congestive heart failure), NYHA class 1:  - diuresed 90 lbs since admission.  - was on CVVH/UF while in ICU per PCCM - Was on Po lasix, stopped per Renal - creatinine stable and Urine output starting to decraese - weight up 1kg, will start PO torsemide per Cards recs - removed foley, remove central line today - needs close FU with Cardiology  ACute Hypoxic resp failure -due to above -s/p VDRF, wean O2  Diarrhea:  -c diff negative -improved  DM (diabetes mellitus), type 2  -uncontrolled.  -increase lantus, stopped glucerna -novolog with meals  Essential hypertension  COPD  CKD (chronic kidney disease), stage III  Anasarca  AKI (acute kidney injury)   Unspecified hypothyroidism  Mitral valve stenosis, moderate  Pulmonary HTN  Acute-on-chronic renal failure  Acute on chronic respiratory failure with hypercapnia  Delirium  Cardiorenal syndrome with renal failure  Chronic low back pain: better with lidoderm - palliative following for pain Morbid obesity  Physical deconditioning, severe. Patient was essentially bedbound for several months PTA- agreeable to go to SNF  Code Status: full Family Communication: patient Disposition Plan: SNF when stable per Cards, ? tomorrow   Consultants:  PCCM  Renal   Palliative care (pain management)  Procedures: L IJ CVL 10/07 >>  ETT 10/12 >> 10/14  10/13 R IJ HD cath >>  CRRT, HD     HPI/Subjective: Feeling better, breathing good, eating better  Objective: Filed Vitals:   07/03/14 1012  BP: 115/37  Pulse: 92  Temp: 97.8 F (36.6 C)  Resp: 20    Intake/Output Summary (Last 24 hours) at 07/03/14 1129 Last data filed at 07/03/14 1013  Gross per 24 hour  Intake    990 ml  Output    400 ml  Net    590 ml   Filed Weights   06/29/14 2151 06/30/14 2111 07/01/14 2144  Weight: 117.3 kg (258 lb 9.6 oz) 115.078 kg (253 lb 11.2 oz) 116.07 kg (255 lb 14.2 oz)    Exam:   General:  A+Ox3, NAD  HEENT: Central line  Cardiovascular: S1S2/RRR  Respiratory: no wheezing, diminished at bases  Abdomen: obese  Musculoskeletal: no edema- LE   Data Reviewed: Basic Metabolic Panel:  Recent Labs Lab 06/29/14 0440 06/30/14 0453 06/30/14 1157 07/01/14 0433 07/02/14 0537 07/03/14 0515  NA 140  140  --  139 135* 135*  K 4.2 3.9  --  3.7 3.8 3.7  CL 100 97  --  93* 92* 90*  CO2 26 30  --  31 30 31   GLUCOSE 198* 278* 498* 285* 374* 301*  BUN 42* 48*  --  54* 54* 59*  CREATININE 1.88* 1.93*  --  2.00* 1.92* 1.97*  CALCIUM 9.7 9.7  --  9.3 9.3 9.6  PHOS 4.0 4.4  --  4.1 3.9 3.6   Liver Function Tests:  Recent Labs Lab 06/29/14 0440 06/30/14 0453 07/01/14 0433  07/02/14 0537 07/03/14 0515  ALBUMIN 3.2* 3.4* 3.4* 3.2* 3.4*   No results found for this basename: LIPASE, AMYLASE,  in the last 168 hours No results found for this basename: AMMONIA,  in the last 168 hours CBC:  Recent Labs Lab 06/29/14 0440 07/02/14 0537  WBC 8.2 7.3  HGB 9.6* 10.0*  HCT 29.7* 30.5*  MCV 92.2 90.5  PLT 192 173   Cardiac Enzymes: No results found for this basename: CKTOTAL, CKMB, CKMBINDEX, TROPONINI,  in the last 168 hours BNP (last 3 results)  Recent Labs  09/10/13 1827 10/13/13 1916 06/09/14 1818  PROBNP 4050.0* 1519.0* 4358.0*   CBG:  Recent Labs Lab 07/02/14 0857 07/02/14 1325 07/02/14 1729 07/02/14 2158 07/03/14 0729  GLUCAP 318* 375* 360* 292* 315*    Recent Results (from the past 240 hour(s))  CLOSTRIDIUM DIFFICILE BY PCR     Status: None   Collection Time    06/29/14  5:12 PM      Result Value Ref Range Status   C difficile by pcr NEGATIVE  NEGATIVE Final     Studies: No results found.  Scheduled Meds: . amLODipine  5 mg Oral Daily  . Brinzolamide-Brimonidine  1 drop Both Eyes BID  . Chlorhexidine Gluconate Cloth  6 each Topical Q0600  . heparin subcutaneous  5,000 Units Subcutaneous 3 times per day  . insulin aspart  0-15 Units Subcutaneous TID WC  . insulin aspart  0-5 Units Subcutaneous QHS  . insulin aspart  5 Units Subcutaneous TID WC  . insulin glargine  40 Units Subcutaneous BID  . levothyroxine  50 mcg Oral QAC breakfast  . lidocaine  1 patch Transdermal Q24H  . metoprolol succinate  50 mg Oral Daily  . mupirocin ointment  1 application Nasal BID  . nystatin   Topical TID  . sodium chloride  10-40 mL Intracatheter Q12H  . torsemide  40 mg Oral BID  . torsemide  40 mg Oral BID   Continuous Infusions:   Antibiotics Given (last 72 hours)   None      Principal Problem:   Acute on chronic diastolic CHF (congestive heart failure), NYHA class 1 Active Problems:   DM (diabetes mellitus), type 2 with renal  complications   HYPERLIPIDEMIA   TOBACCO ABUSE   Essential hypertension   COPD   CKD (chronic kidney disease), stage III   Anasarca   AKI (acute kidney injury)   Unspecified hypothyroidism   Mitral valve stenosis, moderate   Pulmonary HTN   Acute-on-chronic renal failure   Acute on chronic respiratory failure with hypercapnia   Delirium   Cardiorenal syndrome with renal failure   Chronic low back pain   Morbid obesity   Physical deconditioning    Time spent: 25 min    [facility] HospitalJOSEPH,Abelino Tippin  [facility] Pager [phone]. If 7PM-7AM, please contact night-coverage at [email or web address], password [email or web address] 07/03/2014, 11:29 AM  LOS: 24  days

## 2014-07-03 NOTE — Progress Notes (Signed)
NUTRITION FOLLOW-UP  DOCUMENTATION CODES Per approved criteria  -Morbid Obesity   INTERVENTION: Nepro Shake po BID, each supplement provides 425 kcal and 19 grams protein.  Encourage PO intake.  NUTRITION DIAGNOSIS: Inadequate oral intake decreased appetite as evidenced by Meal Completion: <25%; ongoing  Goal: Enteral nutrition to provide 60-70% of estimated calorie needs (22-25 kcals/kg ideal body weight) and 100% of estimated protein needs, based on ASPEN guidelines for permissive underfeeding in critically ill obese individuals; NA  New Goal:  Pt to meet >/= 90% of their estimated nutrition needs; not met  Monitor:  PO intake,weight trends, labs  ASSESSMENT: Pt admitted on 9/29 with anasarca, exacerbation of diastolic heart failure and AKI. Pt has required lasix gtt and milrinone. Pt became more lethargic 10/12, failed BiPAP and intubated.  Pt started CVVHD 10/13 due to cardiorenal syndrome.   10/16-Pt extubated 10/14. Pt continued CVVHD for volume removal but plans to transition to IHD 10/17. Palliative care meeting planned as pt will need SNF and per Renal would not do well with HD long term.   10/23- Pt no longer requiring HD for now. Meal completion has varied from 0-75%. Pt reports a lack of appetite. Pt has been drinking her Nepro drinks when meal completion is inadequate. Pt refused additional supplements saying she is fine with just the Nepro. Pt was encouraged to eat her food at meals for adequate nutrition.  Labs:  Potassium and Phosphorus low Cbg: 120-192  Height: Ht Readings from Last 1 Encounters:  07/01/14 5' 4"  (1.626 m)    Weight: Wt Readings from Last 1 Encounters:  07/01/14 255 lb 14.2 oz (116.07 kg)  Pt negative 46 L  BMI:  Body mass index is 43.9 kg/(m^2). Morbid obesity  Estimated Nutritional Needs: Kcal: 2000-2300 Protein: >/= 136 grams Fluid: 1.5 L  Skin:  Skin tear, diabetic foot ulcer  Diet Order: Dysphagia 3  Intake/Output Summary  (Last 24 hours) at 07/03/14 0943 Last data filed at 07/02/14 2153  Gross per 24 hour  Intake    630 ml  Output    600 ml  Net     30 ml    Last BM: 10/22  Labs:   Recent Labs Lab 07/01/14 0433 07/02/14 0537 07/03/14 0515  NA 139 135* 135*  K 3.7 3.8 3.7  CL 93* 92* 90*  CO2 31 30 31   BUN 54* 54* 59*  CREATININE 2.00* 1.92* 1.97*  CALCIUM 9.3 9.3 9.6  PHOS 4.1 3.9 3.6  GLUCOSE 285* 374* 301*    CBG (last 3)   Recent Labs  07/02/14 1729 07/02/14 2158 07/03/14 0729  GLUCAP 360* 292* 315*    Scheduled Meds: . amLODipine  5 mg Oral Daily  . Brinzolamide-Brimonidine  1 drop Both Eyes BID  . Chlorhexidine Gluconate Cloth  6 each Topical Q0600  . heparin subcutaneous  5,000 Units Subcutaneous 3 times per day  . insulin aspart  0-15 Units Subcutaneous TID WC  . insulin aspart  0-5 Units Subcutaneous QHS  . insulin aspart  5 Units Subcutaneous TID WC  . insulin glargine  40 Units Subcutaneous BID  . levothyroxine  50 mcg Oral QAC breakfast  . lidocaine  1 patch Transdermal Q24H  . metoprolol succinate  50 mg Oral Daily  . mupirocin ointment  1 application Nasal BID  . nystatin   Topical TID  . sodium chloride  10-40 mL Intracatheter Q12H  . torsemide  40 mg Oral BID    Continuous Infusions: . sodium chloride  Stopped (06/23/14 1856)   Kallie Locks, MS, RD, LDN Pager # [phone] After hours/ weekend pager # [phone]

## 2014-07-04 DIAGNOSIS — E1129 Type 2 diabetes mellitus with other diabetic kidney complication: Secondary | ICD-10-CM

## 2014-07-04 LAB — RENAL FUNCTION PANEL
Albumin: 3.5 g/dL (ref 3.5–5.2)
Anion gap: 13 (ref 5–15)
BUN: 62 mg/dL — ABNORMAL HIGH (ref 6–23)
CHLORIDE: 92 meq/L — AB (ref 96–112)
CO2: 33 mEq/L — ABNORMAL HIGH (ref 19–32)
Calcium: 9.9 mg/dL (ref 8.4–10.5)
Creatinine, Ser: 2 mg/dL — ABNORMAL HIGH (ref 0.50–1.10)
GFR calc Af Amer: 30 mL/min — ABNORMAL LOW (ref >90)
GFR, EST NON AFRICAN AMERICAN: 26 mL/min — AB (ref >90)
Glucose, Bld: 271 mg/dL — ABNORMAL HIGH (ref 70–99)
POTASSIUM: 3.6 meq/L — AB (ref 3.7–5.3)
Phosphorus: 3.7 mg/dL (ref 2.3–4.6)
SODIUM: 138 meq/L (ref 137–147)

## 2014-07-04 LAB — GLUCOSE, CAPILLARY
Glucose-Capillary: 236 mg/dL — ABNORMAL HIGH (ref 70–99)
Glucose-Capillary: 405 mg/dL — ABNORMAL HIGH (ref 70–99)
Glucose-Capillary: 375 mg/dL — ABNORMAL HIGH (ref 70–99)
Glucose-Capillary: 202 mg/dL — ABNORMAL HIGH (ref 70–99)

## 2014-07-04 MED ORDER — INSULIN GLARGINE 100 UNIT/ML ~~LOC~~ SOLN
50.0000 [IU] | Freq: Two times a day (BID) | SUBCUTANEOUS | Status: DC
  Administered 2014-07-04 – 2014-07-05 (×3): 50 [IU] via SUBCUTANEOUS
  Filled 2014-07-04 (×4): qty 0.5

## 2014-07-04 NOTE — Progress Notes (Signed)
PROGRESS NOTE  Kimberly [name] XLK:[id number] DOB: [birth year] DOA: 06/09/2014 PCP: Ginette OttoSTONEKING,[name] THOMAS, MD  Summary: 58 y.o. F admitted 9/29 with anasarca, acute exacerbation of diastolic heart failure, AKI. Since admission, has required lasix gtt and milrinone per recommendations of HF team - continues to have sluggish diuresis despite multiple meds. On afternoon of 10/12, pt became more lethargic. ABG demonstrated hypercarbic respiratory failure. Pt placed on BiPAP but did not have any improvement. PCCM consulted for Intubation. Required CRRT for neg balance. Now intermittent HD. Weight down 80+ lbs  SIGNIFICANT EVENTS:  9/29 admit  9/30 renal consulted  10/3 renal signoff  10/05 HF team consulted, CVL placed for CVP's and Co-Ox's.  10/06 lasix gtt, milrinone started  10/10 renal re-consulted for sluggish diuresis, AKI.  10/12 PCCM consultation for progressive resp failure, intubated  10/13 in and out PA cath by Dr Gala RomneyBensimhon. Markedly elevated PCWP and PAPs. PAC removed and HD cath placed  10/13 CRRT initiated by Renal  10/14 negative > 5.5 liters over 24 hrs. Passed SBT. RASS 0, CAM-ICU negative. Extubated successfully. CRRT conitnued  10/15 episode of delerium / aphasia  Assessment/Plan: Acute on chronic diastolic CHF (congestive heart failure), NYHA class 1:  - diuresed 90 lbs since admission.  - was on CVVH/UF while in ICU per PCCM - creatinine stable, weight improving, urine output may not be accurate - started PO torsemide 10/23 - removed foley, remove central line today - needs close FU with Cardiology -SNF tomorrow if [facility] with cards  ACute Hypoxic resp failure -due to above -s/p VDRF, wean O2  Diarrhea:  -c diff negative -improved  DM (diabetes mellitus), type 2  -uncontrolled.  -increase lantus to 50units BID, stopped glucerna -novolog with meals  Essential hypertension  COPD  CKD (chronic kidney disease), stage III  Anasarca  AKI (acute kidney injury)    Unspecified hypothyroidism  Mitral valve stenosis, moderate  Pulmonary HTN  Acute-on-chronic renal failure  Acute on chronic respiratory failure with hypercapnia  Delirium  Cardiorenal syndrome with renal failure  Chronic low back pain: better with lidoderm - palliative following for pain Morbid obesity  Physical deconditioning, severe. Patient was essentially bedbound for several months PTA- agreeable to go to SNF  Code Status: full Family Communication: patient Disposition Plan: SNF tomorrow if ok with Cards   Consultants:  PCCM  Renal   Palliative care (pain management)  Procedures: L IJ CVL 10/07 >>  ETT 10/12 >> 10/14  10/13 R IJ HD cath >>  CRRT, HD     HPI/Subjective: Feeling better, breathing good, eating better  Objective: Filed Vitals:   07/04/14 0913  BP: 151/58  Pulse: 87  Temp: 99 F (37.2 C)  Resp: 20    Intake/Output Summary (Last 24 hours) at 07/04/14 1107 Last data filed at 07/04/14 0900  Gross per 24 hour  Intake    680 ml  Output    528 ml  Net    152 ml   Filed Weights   06/30/14 2111 07/01/14 2144 07/03/14 2133  Weight: 115.078 kg (253 lb 11.2 oz) 116.07 kg (255 lb 14.2 oz) 114.715 kg (252 lb 14.4 oz)    Exam:   General:  A+Ox3, NAD  HEENT: Central line  Cardiovascular: S1S2/RRR  Respiratory: no wheezing, diminished at bases  Abdomen: obese  Musculoskeletal: no edema- LE   Data Reviewed: Basic Metabolic Panel:  Recent Labs Lab 06/30/14 0453 06/30/14 1157 07/01/14 0433 07/02/14 0537 07/03/14 0515 07/04/14 0529  NA 140  --  139 135* 135* 138  K 3.9  --  3.7 3.8 3.7 3.6*  CL 97  --  93* 92* 90* 92*  CO2 30  --  31 30 31  33*  GLUCOSE 278* 498* 285* 374* 301* 271*  BUN 48*  --  54* 54* 59* 62*  CREATININE 1.93*  --  2.00* 1.92* 1.97* 2.00*  CALCIUM 9.7  --  9.3 9.3 9.6 9.9  PHOS 4.4  --  4.1 3.9 3.6 3.7   Liver Function Tests:  Recent Labs Lab 06/30/14 0453 07/01/14 0433 07/02/14 0537 07/03/14 0515  07/04/14 0529  ALBUMIN 3.4* 3.4* 3.2* 3.4* 3.5   No results found for this basename: LIPASE, AMYLASE,  in the last 168 hours No results found for this basename: AMMONIA,  in the last 168 hours CBC:  Recent Labs Lab 06/29/14 0440 07/02/14 0537  WBC 8.2 7.3  HGB 9.6* 10.0*  HCT 29.7* 30.5*  MCV 92.2 90.5  PLT 192 173   Cardiac Enzymes: No results found for this basename: CKTOTAL, CKMB, CKMBINDEX, TROPONINI,  in the last 168 hours BNP (last 3 results)  Recent Labs  09/10/13 1827 10/13/13 1916 06/09/14 1818  PROBNP 4050.0* 1519.0* 4358.0*   CBG:  Recent Labs Lab 07/03/14 0729 07/03/14 1145 07/03/14 1650 07/03/14 2132 07/04/14 0756  GLUCAP 315* 375* 365* 333* 236*    Recent Results (from the past 240 hour(s))  CLOSTRIDIUM DIFFICILE BY PCR     Status: None   Collection Time    06/29/14  5:12 PM      Result Value Ref Range Status   C difficile by pcr NEGATIVE  NEGATIVE Final     Studies: No results found.  Scheduled Meds: . amLODipine  5 mg Oral Daily  . Brinzolamide-Brimonidine  1 drop Both Eyes BID  . feeding supplement (NEPRO CARB STEADY)  237 mL Oral BID BM  . heparin subcutaneous  5,000 Units Subcutaneous 3 times per day  . insulin aspart  0-15 Units Subcutaneous TID WC  . insulin aspart  0-5 Units Subcutaneous QHS  . insulin aspart  5 Units Subcutaneous TID WC  . insulin glargine  50 Units Subcutaneous BID  . levothyroxine  50 mcg Oral QAC breakfast  . lidocaine  1 patch Transdermal Q24H  . metoprolol succinate  50 mg Oral Daily  . nystatin   Topical TID  . sodium chloride  10-40 mL Intracatheter Q12H  . torsemide  40 mg Oral BID   Continuous Infusions:   Antibiotics Given (last 72 hours)   None      Principal Problem:   Acute on chronic diastolic CHF (congestive heart failure), NYHA class 1 Active Problems:   DM (diabetes mellitus), type 2 with renal complications   HYPERLIPIDEMIA   TOBACCO ABUSE   Essential hypertension   COPD   CKD  (chronic kidney disease), stage III   Anasarca   AKI (acute kidney injury)   Unspecified hypothyroidism   Mitral valve stenosis, moderate   Pulmonary HTN   Acute-on-chronic renal failure   Acute on chronic respiratory failure with hypercapnia   Delirium   Cardiorenal syndrome with renal failure   Chronic low back pain   Morbid obesity   Physical deconditioning    Time spent: 25 min    [facility] CenterJOSEPH,Aarika Moon  [facility] Pager [phone]. If 7PM-7AM, please contact night-coverage at [email or web address], password [email or web address] 07/04/2014, 11:07 AM  LOS: 25 days

## 2014-07-04 NOTE — Progress Notes (Signed)
Pt CBG 405. MD notified. Orders for 15 units of novolog plus meal coverage given.

## 2014-07-04 NOTE — Clinical Social Work Note (Signed)
[facility] for patient to admit to SNF. Patient will be [facility] Sunday. MD please prepare DC Summary on Sunday. Signed FL2 in chart. Handoff updated.  Roddie McBryant Dickson Kostelnik MSW, [place]CSWA, [place], [phone]

## 2014-07-04 NOTE — Progress Notes (Signed)
SUBJECTIVE: Pt feeling well, but a little depressed. Been coughing up clear phlegm. No SOB. Did say she's been anxious. Feels like she's urinating more, and is not certain if all of it is being recorded.     Intake/Output Summary (Last 24 hours) at 07/04/14 0813 Last data filed at 07/04/14 0700  Gross per 24 hour  Intake    800 ml  Output    603 ml  Net    197 ml    Current Facility-Administered Medications  Medication Dose Route Frequency Provider Last Rate Last Dose  . 0.9 %  sodium chloride infusion  100 mL Intravenous PRN Terrial RhodesJoseph Coladonato, MD      . 0.9 %  sodium chloride infusion  100 mL Intravenous PRN Terrial RhodesJoseph Coladonato, MD      . amLODipine (NORVASC) tablet 5 mg  5 mg Oral Daily Dolores Pattyaniel R Bensimhon, MD   5 mg at 07/03/14 1718  . Brinzolamide-Brimonidine 1-0.2 % SUSP 1 drop  1 drop Both Eyes BID Marinda ElkAbraham Feliz Ortiz, MD   1 drop at 07/01/14 2256  . cyclobenzaprine (FLEXERIL) tablet 5 mg  5 mg Oral TID PRN Christiane Haorinna L Sullivan, MD   5 mg at 07/01/14 2255  . feeding supplement (NEPRO CARB STEADY) liquid 237 mL  237 mL Oral BID BM Marijean NiemannStephanie La, RD   237 mL at 07/03/14 1844  . fentaNYL (SUBLIMAZE) injection 12.5-25 mcg  12.5-25 mcg Intravenous Q2H PRN Merwyn Katosavid B Simonds, MD   25 mcg at 06/25/14 [id number]  . heparin injection 5,000 Units  5,000 Units Subcutaneous 3 times per day Aundria RudAli B Cosgrove, NP   5,000 Units at 07/04/14 0534  . insulin aspart (novoLOG) injection 0-15 Units  0-15 Units Subcutaneous TID WC Rolan Lipahomas Michael Callahan, NP   15 Units at 07/03/14 1717  . insulin aspart (novoLOG) injection 0-5 Units  0-5 Units Subcutaneous QHS Rolan Lipahomas Michael Callahan, NP   4 Units at 07/03/14 2220  . insulin aspart (novoLOG) injection 5 Units  5 Units Subcutaneous TID WC Zannie CovePreetha Joseph, MD   5 Units at 07/03/14 1716  . insulin glargine (LANTUS) injection 40 Units  40 Units Subcutaneous BID Zannie CovePreetha Joseph, MD   40 Units at 07/03/14 2221  . ipratropium-albuterol (DUONEB) 0.5-2.5 (3) MG/3ML  nebulizer solution 3 mL  3 mL Nebulization Q4H PRN Merwyn Katosavid B Simonds, MD      . levothyroxine (SYNTHROID, LEVOTHROID) tablet 50 mcg  50 mcg Oral QAC breakfast Merwyn Katosavid B Simonds, MD   50 mcg at 07/03/14 1017  . lidocaine (LIDODERM) 5 % 1 patch  1 patch Transdermal Q24H Christiane Haorinna L Sullivan, MD   1 patch at 07/03/14 1321  . lidocaine (PF) (XYLOCAINE) 1 % injection 5 mL  5 mL Intradermal PRN Terrial RhodesJoseph Coladonato, MD      . lidocaine-prilocaine (EMLA) cream 1 application  1 application Topical PRN Terrial RhodesJoseph Coladonato, MD      . metoprolol succinate (TOPROL-XL) 24 hr tablet 50 mg  50 mg Oral Daily Merwyn Katosavid B Simonds, MD   50 mg at 07/03/14 1113  . nystatin (MYCOSTATIN/NYSTOP) topical powder   Topical TID Marinda ElkAbraham Feliz Ortiz, MD      . ondansetron [facility](ZOFRAN) injection 4 mg  4 mg Intravenous Q6H PRN Rolan Lipahomas Michael Callahan, NP   4 mg at 06/21/14 0945  . pentafluoroprop-tetrafluoroeth (GEBAUERS) aerosol 1 application  1 application Topical PRN Terrial RhodesJoseph Coladonato, MD      . RESOURCE THICKENUP CLEAR   Oral PRN Merwyn Katosavid B Simonds, MD      .  simethicone (MYLICON) chewable tablet 80 mg  80 mg Oral QID PRN Cleda DaubAaron John Lampkin, DO      . sodium chloride 0.9 % injection 10-40 mL  10-40 mL Intracatheter Q12H Nelda Bucksaniel J Feinstein, MD   10 mL at 07/01/14 1001  . sodium chloride 0.9 % injection 10-40 mL  10-40 mL Intracatheter PRN Merwyn Katosavid B Simonds, MD   10 mL at 07/03/14 1648  . sorbitol 70 % solution 30 mL  30 mL Oral Daily PRN Rodolph Bonganiel Thompson V, MD   30 mL at 06/11/14 0534  . torsemide (DEMADEX) tablet 40 mg  40 mg Oral BID Dolores Pattyaniel R Bensimhon, MD   40 mg at 07/03/14 1718    Filed Vitals:   07/03/14 1012 07/03/14 1653 07/03/14 2133 07/04/14 0501  BP: 115/37 143/50 146/45 141/52  Pulse: 92 80 79 82  Temp: 97.8 F (36.6 C) 98.5 F (36.9 C) 98.4 F (36.9 C) 99.1 F (37.3 C)  TempSrc: Oral Oral Oral Oral  Resp: 20 20 18 17   Height:      Weight:   252 lb 14.4 oz (114.715 kg)   SpO2: 100% 100% 98% 100%    PHYSICAL EXAM General:  Chronically ill appearing. No distress  HEENT: normal.  Neck: supple. Carotids 2+ bilat; no bruits. No lymphadenopathy or thryomegaly appreciated. RIJ  Cor: PMI nondisplaced. Regular rate & rhythm. No rubs, gallops or murmurs.  Lungs: clear  Abdomen: obese, nontender, nondistended. No hepatosplenomegaly. No bruits or masses. Good bowel sounds.  Extremities: no cyanosis, clubbing, rash, R and LLE trace edema with wrinkling of skin. Neuro: Pleasant. Nonfocal.    LABS: Basic Metabolic Panel:  Recent Labs  16/06/9609/23/15 0515 07/04/14 0529  NA 135* 138  K 3.7 3.6*  CL 90* 92*  CO2 31 33*  GLUCOSE 301* 271*  BUN 59* 62*  CREATININE 1.97* 2.00*  CALCIUM 9.6 9.9  PHOS 3.6 3.7   Liver Function Tests:  Recent Labs  07/03/14 0515 07/04/14 0529  ALBUMIN 3.4* 3.5   No results found for this basename: LIPASE, AMYLASE,  in the last 72 hours CBC:  Recent Labs  07/02/14 0537  WBC 7.3  HGB 10.0*  HCT 30.5*  MCV 90.5  PLT 173   Cardiac Enzymes: No results found for this basename: CKTOTAL, CKMB, CKMBINDEX, TROPONINI,  in the last 72 hours BNP: No components found with this basename: POCBNP,  D-Dimer: No results found for this basename: DDIMER,  in the last 72 hours Hemoglobin A1C: No results found for this basename: HGBA1C,  in the last 72 hours Fasting Lipid Panel: No results found for this basename: CHOL, HDL, LDLCALC, TRIG, CHOLHDL, LDLDIRECT,  in the last 72 hours Thyroid Function Tests: No results found for this basename: TSH, T4TOTAL, FREET3, T3FREE, THYROIDAB,  in the last 72 hours Anemia Panel: No results found for this basename: VITAMINB12, FOLATE, FERRITIN, TIBC, IRON, RETICCTPCT,  in the last 72 hours      ASSESSMENT: 1. A/C Diastolic HF ECHO EF 55-60%  2. Mitral Valve Stenosis  3. Obesity  4. DM --> Hgb A1C 7  5. Cardio Renal Syndrome  6. Hypothyroidism --> TSH 18.36  7. Immobility  8. Pulmonary venous HTN  9. Acute hypercarbic resp failure -  10. AKI,  on CVVHD  11. Hypokalemia  12. AMS  - CT head (10/15) no stroke  13. Anemia  14. Deconditioning   PLAN: BUN/Cr mildly increased from yesterday, but GFR overall stable. Torsemide 40 mg bid started 10/23. Uncertain if I/O's  accurate, as pt feels like urination has increased since yesterday. For the time being, continue with present regimen. Will need f/u with Dr. Jens Som in our clinic. Her PCP, Dr. Merlene Laughter, will be communicating with him regarding status of heart failure when pt is in SNF.   Prentice Docker, M.D., F.A.C.C.

## 2014-07-05 LAB — BASIC METABOLIC PANEL
Anion gap: 14 (ref 5–15)
BUN: 64 mg/dL — AB (ref 6–23)
CALCIUM: 9.9 mg/dL (ref 8.4–10.5)
CO2: 33 meq/L — AB (ref 19–32)
CREATININE: 2.08 mg/dL — AB (ref 0.50–1.10)
Chloride: 90 mEq/L — ABNORMAL LOW (ref 96–112)
GFR calc Af Amer: 29 mL/min — ABNORMAL LOW (ref >90)
GFR calc non Af Amer: 25 mL/min — ABNORMAL LOW (ref >90)
GLUCOSE: 216 mg/dL — AB (ref 70–99)
Potassium: 3.5 mEq/L — ABNORMAL LOW (ref 3.7–5.3)
Sodium: 137 mEq/L (ref 137–147)

## 2014-07-05 LAB — RENAL FUNCTION PANEL
ALBUMIN: 3.5 g/dL (ref 3.5–5.2)
ANION GAP: 16 — AB (ref 5–15)
BUN: 63 mg/dL — ABNORMAL HIGH (ref 6–23)
CHLORIDE: 92 meq/L — AB (ref 96–112)
CO2: 32 mEq/L (ref 19–32)
Calcium: 9.9 mg/dL (ref 8.4–10.5)
Creatinine, Ser: 2.04 mg/dL — ABNORMAL HIGH (ref 0.50–1.10)
GFR calc non Af Amer: 26 mL/min — ABNORMAL LOW (ref >90)
GFR, EST AFRICAN AMERICAN: 30 mL/min — AB (ref >90)
GLUCOSE: 196 mg/dL — AB (ref 70–99)
POTASSIUM: 3.3 meq/L — AB (ref 3.7–5.3)
Phosphorus: 3.8 mg/dL (ref 2.3–4.6)
SODIUM: 140 meq/L (ref 137–147)

## 2014-07-05 LAB — GLUCOSE, CAPILLARY
GLUCOSE-CAPILLARY: 199 mg/dL — AB (ref 70–99)
Glucose-Capillary: 197 mg/dL — ABNORMAL HIGH (ref 70–99)

## 2014-07-05 MED ORDER — AMLODIPINE BESYLATE 5 MG PO TABS: 5.0000 mg | ORAL_TABLET | Freq: Every day | ORAL | Status: DC

## 2014-07-05 MED ORDER — METOPROLOL SUCCINATE ER 50 MG PO TB24: 50.0000 mg | ORAL_TABLET | Freq: Every day | ORAL | Status: DC

## 2014-07-05 MED ORDER — LEVOTHYROXINE SODIUM 50 MCG PO TABS: 50.0000 ug | ORAL_TABLET | Freq: Every day | ORAL | Status: DC

## 2014-07-05 MED ORDER — TORSEMIDE 20 MG PO TABS: 40.0000 mg | ORAL_TABLET | Freq: Two times a day (BID) | ORAL | Status: DC

## 2014-07-05 MED ORDER — POTASSIUM CHLORIDE CRYS ER 20 MEQ PO TBCR
40.0000 meq | EXTENDED_RELEASE_TABLET | Freq: Every day | ORAL | Status: DC
  Administered 2014-07-05: 40 meq via ORAL
  Filled 2014-07-05: qty 2

## 2014-07-05 MED ORDER — INSULIN ASPART 100 UNIT/ML ~~LOC~~ SOLN: 6.0000 [IU] | Freq: Three times a day (TID) | SUBCUTANEOUS | Status: DC

## 2014-07-05 MED ORDER — CALCIUM CARBONATE ANTACID 500 MG PO CHEW
1.0000 | CHEWABLE_TABLET | Freq: Four times a day (QID) | ORAL | Status: DC | PRN
  Filled 2014-07-05: qty 1

## 2014-07-05 MED ORDER — POTASSIUM CHLORIDE CRYS ER 20 MEQ PO TBCR
40.0000 meq | EXTENDED_RELEASE_TABLET | Freq: Every day | ORAL | Status: DC
Start: 2014-07-05 — End: 2014-08-14

## 2014-07-05 NOTE — Progress Notes (Signed)
Pt transported to [facility] via [facility]. Pt agreeable to tx.

## 2014-07-05 NOTE — Progress Notes (Signed)
Patient Discharge: Disposition: [facility] Education: Patient educated about the medications, prescriptions and discharge instructions. IV: Peripheral IV discontinued before discharge. Transportation: Patient transported by the Ambulance with the two EMS staff accompanying. Belongings: Patient took all her belongings with her.

## 2014-07-05 NOTE — Progress Notes (Signed)
Patient complaining of light chest pain.  States that it is from her "acid reflux," and that it happens occasionally when she is not sitting upright while sleeping.  In no acute distress.  No shortness of breath or radiating pain.  Lynch, PA notified of situation; orders received for TUMS PRN.  Will continue to monitor.

## 2014-07-05 NOTE — Discharge Summary (Signed)
Physician Discharge Summary  Kimberly [name] NFA:[id number] DOB: [birth year] DOA: 06/09/2014  PCP: Kimberly Otto, MD  Admit date: 06/09/2014 Discharge date: 07/05/2014  Time spent:  Recommendations for Outpatient Follow-up:  1. Dr.Crenshaw in 1week, office to call with FU 2. PCP in 1 week 3. Bmet in 1 week  Discharge Diagnoses:  Principal Problem:   Acute on chronic diastolic CHF (congestive heart failure), NYHA class 1 Active Problems:   DM (diabetes mellitus), type 2 with renal complications   HYPERLIPIDEMIA   TOBACCO ABUSE   Essential hypertension   COPD   CKD (chronic kidney disease), stage III   Anasarca   AKI (acute kidney injury)   Unspecified hypothyroidism   Mitral valve stenosis, moderate   Pulmonary HTN   Acute-on-chronic renal failure   Acute on chronic respiratory failure with hypercapnia   Delirium   Cardiorenal syndrome with renal failure   Chronic low back pain   Morbid obesity   Physical deconditioning   Discharge Condition: stable  Diet recommendation: low sodium, diabetic  Filed Weights   07/01/14 2144 07/03/14 2133 07/04/14 2239  Weight: 116.07 kg (255 lb 14.2 oz) 114.715 kg (252 lb 14.4 oz) 113.082 kg (249 lb 4.8 oz)    History of present illness:  Chief Complaint: anasarca, diastolic CHF exacerbation, AKI  History of Present Illness:This is a 58 y.o. year old female with significant past medical history of morbid obesity, grade 1 diastolic dysfunction, IDDM, CAD, stage 3 CKD, chronic resp failure on 2L [place] at home presented to ER with anasarca, diastolic CHF exacerbation, AKI. Pt stated that she was previously on 40 mg lasix for chronic LE edema up until 1 month ago. Pt stated that she had had progressive abdominal and LE swelling over the past month. Pt stated that she has seen her PCP about this issue over this same time frame and has had her lasix increased from 40mf po qd to 160mg  BID. Pt stated that she has had an approximately 45  lb weight gain over the past month despite increased diuretic use. 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 Markedly elevated PCWP and PAPs. PAC removed and HD cath placed  -On 10/13 CRRT initiated by Renal, subsequently discontinued - diuresed 90 lbs since admission.  - creatinine stable at 1.9-2 range now - started PO torsemide at 40mg  BID on10/23, continues to improve on this regimen, weight continues to slowly decraese - removed foley and central line 2days ago  - needs close FU with Cardiology, d/w Cards PA-i have been told that [facility] office will call patient with FU  ACute Hypoxic resp failure  -due to above  -s/p VDRF, wean O2   Diarrhea:  -c diff negative  -improving, likely malabsorption from supplements which were cut down  DM (diabetes mellitus), type 2  -poorly controlled, now better -increase lantus to 55nits BID, novolog with meals   Essential hypertension  -stable  COPD  -stable  AKI on CKD, stage III  -cardiorenal syndrome with renal failure -improved and stable now -see #1  Pulmonary HTN   Chronic low back pain: better with lidoderm   Morbid obesity   Physical deconditioning, severe. Patient was essentially bedbound for several months PTA- plan for SNF      Discharge Exam: Filed  Vitals:   07/05/14 0620  BP: 153/52  Pulse: 76  Temp: 98.1 F (36.7 C)  Resp: 18    General: AAOx3 Cardiovascular: S1S2/RRR Respiratory: CTAB  Discharge Instructions You were cared for by a hospitalist during your hospital stay. If you have any questions about your discharge medications or the care you received while you were in the hospital after you are discharged, you can call the unit and asked to speak with the hospitalist on call if the  hospitalist that took care of you is not available. Once you are discharged, your primary care physician will handle any further medical issues. Please note that NO REFILLS for any discharge medications will be authorized once you are discharged, as it is imperative that you return to your primary care physician (or establish a relationship with a primary care physician if you do not have one) for your aftercare needs so that they can reassess your need for medications and monitor your lab values.  Discharge Instructions   Diet - low sodium heart healthy    Complete by:  As directed      Diet Carb Modified    Complete by:  As directed      Increase activity slowly    Complete by:  As directed           Current Discharge Medication List    START taking these medications   Details  amLODipine (NORVASC) 5 MG tablet Take 1 tablet (5 mg total) by mouth daily.    levothyroxine (SYNTHROID, LEVOTHROID) 50 MCG tablet Take 1 tablet (50 mcg total) by mouth daily before breakfast.    metoprolol succinate (TOPROL-XL) 50 MG 24 hr tablet Take 1 tablet (50 mg total) by mouth daily. Take with or immediately following a meal.    potassium chloride SA (K-DUR,KLOR-CON) 20 MEQ tablet Take 2 tablets (40 mEq total) by mouth daily.    torsemide (DEMADEX) 20 MG tablet Take 2 tablets (40 mg total) by mouth 2 (two) times daily.      CONTINUE these medications which have CHANGED   Details  insulin aspart (NOVOLOG) 100 UNIT/ML injection Inject 6 Units into the skin 3 (three) times daily with meals.      CONTINUE these medications which have NOT CHANGED   Details  acetaminophen (TYLENOL) 325 MG tablet Take 650 mg by mouth every 6 (six) hours as needed for mild pain.    albuterol (PROVENTIL HFA;VENTOLIN HFA) 108 (90 BASE) MCG/ACT inhaler Inhale 1-2 puffs into the lungs every 6 (six) hours as needed for wheezing or shortness of breath.    aspirin EC 81 MG tablet Take 81 mg by mouth daily.    insulin glargine  (LANTUS) 100 UNIT/ML injection Inject 55 Units into the skin 2 (two) times daily.    Multiple Vitamins-Minerals (MULTIVITAMIN PO) Take 1 tablet by mouth daily.      STOP taking these medications     cloNIDine (CATAPRES) 0.1 MG tablet      FIBER PO      furosemide (LASIX) 40 MG tablet      metoprolol tartrate (LOPRESSOR) 25 MG tablet      pregabalin (LYRICA) 75 MG capsule        Allergies  Allergen Reactions  . Epinephrine     Increased heart rate   Follow-up Information   Follow up with Olga Millers, MD In 1 week.   Specialty:  Cardiology   Contact information:   [address] [place] [address]  [phone]       Follow up with Bmet In 1 week.       The results of significant diagnostics from this hospitalization (including imaging, microbiology, ancillary and laboratory) are listed below for reference.    Significant Diagnostic Studies: Ct Abdomen Pelvis Wo Contrast  06/10/2014   CLINICAL DATA:  Urinary retention. 50 lb weight gain over the last month. Seeping wounds from the abdominal area.  EXAM: CT ABDOMEN AND PELVIS WITHOUT CONTRAST  TECHNIQUE: Multidetector CT imaging of the abdomen and pelvis was performed following the standard protocol without IV contrast.  COMPARISON:  11/16/2006  FINDINGS: Technically limited study due to patient's body habitus.  Small right pleural effusion. Calcified granuloma in the left lung base.  Diffuse edema throughout the subcutaneous fat of the abdomen and pelvis. Free fluid in the abdomen and pelvis probably representing ascites.  The unenhanced appearance of the liver, spleen, pancreas, adrenal glands, inferior vena cava, and retroperitoneal lymph nodes is unremarkable. Calcifications in the renal hila bilaterally probably represent vascular calcifications. Extensive calcification of the abdominal aorta without aneurysm. Stomach, small bowel, and colon are not abnormally distended. Contrast material flows through to  the colon without evidence of obstruction. No free air in the abdomen.  Pelvis: No pelvic mass or lymphadenopathy. Appendix is not identified. No significant lymphadenopathy in the pelvis. Bladder is decompressed with a Foley catheter. Degenerative changes in the lumbar spine. No destructive bone lesions appreciated.  IMPRESSION: Technically limited study due to patient's body habitus. There is extensive edema throughout the subcutaneous fat of the abdomen. Small right pleural effusion. Moderate abdominal/pelvic ascites.   Electronically Signed   By: Burman NievesWilliam  Stevens M.D.   On: 06/10/2014 02:01   Dg Chest 1 View  06/09/2014   CLINICAL DATA:  Leg swelling with weight gain.  History of stroke.  EXAM: CHEST - 1 VIEW  COMPARISON:  10/13/2013 and 09/25/2013.  FINDINGS: Examination is limited by body habitus. 1855 hr. Cardiomegaly and chronic vascular congestion are stable. There is no confluent airspace opacity, pleural effusion or pneumothorax. No acute osseous findings are seen.  IMPRESSION: Cardiomegaly with mild vascular congestion.   Electronically Signed   By: Roxy HorsemanBill  Veazey M.D.   On: 06/09/2014 19:04   Ct Head Wo Contrast  06/25/2014   CLINICAL DATA:  Altered mental status. Aphasia. Evaluate for infarct. Initial encounter.  EXAM: CT HEAD WITHOUT CONTRAST  TECHNIQUE: Contiguous axial images were obtained from the base of the skull through the vertex without intravenous contrast.  COMPARISON:  08/22/2010.  FINDINGS: Skull and Sinuses:Chronic bilateral mastoid opacification. No evidence of erosion since previous. Clear paranasal sinuses. No fracture or destructive process.  Orbits: No acute findings.  Bilateral cataract resection.  Brain: No evidence of acute abnormality, such as acute cortical infarction, hemorrhage, hydrocephalus, or mass lesion/mass effect.  There is patchy bilateral cerebral white matter disease, focal in the subcortical posterior left frontal region, which has progressed from 2011. There  is a remote occipital pole infarct on the left which is small.  IMPRESSION: 1. No acute intracranial findings. 2. 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 3. Remote infarct of the left occipital cortex. 4. Chronic bilateral mastoid opacification.   Electronically Signed   By: Tiburcio PeaJonathan  Watts M.D.   On: 06/25/2014 02:19   [place] Renal  06/09/2014   CLINICAL DATA:  Acute kidney injury. Weight gain of 50 lb in 30 days.  EXAM: RENAL/URINARY TRACT ULTRASOUND COMPLETE  COMPARISON:  CT abdomen and pelvis 11/16/2006  FINDINGS: Examination is technically limited due to the patient's body habitus and diffuse edema.  Right Kidney:  Right kidney is not visualized.  Left Kidney:  Left kidney is not visualized.  Bladder:  Bladder is not visualized.  IMPRESSION: Kidneys and bladder are not visualized due to patient's body habitus and diffuse edema. Nondiagnostic study.   Electronically Signed   By: Burman Nieves M.D.   On: 06/09/2014 23:59   US Abdomen Limited  06/10/2014   CLINICAL DATA:  Abdominal distention.  EXAM: LIMITED ABDOMEN ULTRASOUND FOR ASCITES  TECHNIQUE: Limited ultrasound survey for ascites was performed in all four abdominal quadrants.  COMPARISON:  None.  FINDINGS: Images obtained of all 4 quadrants. Examination is technically limited due to body habitus and edema. No ascites is identified. However, the diagnostic quality of the study is limited in ascites is not entirely excluded based on this study.  IMPRESSION: Technically limited study. No ascites identified but due to technical limitations cannot be excluded.   Electronically Signed   By: Burman Nieves M.D.   On: 06/10/2014 00:05   Dg Chest Port 1 View  06/24/2014   CLINICAL DATA:  Respiratory failure, endotracheal tube positioning.  EXAM: PORTABLE CHEST - 1 VIEW  COMPARISON:  06/23/2014  FINDINGS: Endotracheal tube tip 2.7 cm above the carina. Left IJ line tip: Brachycephalic vein. Right IJ line tip: Lower SVC.  Nasogastric tube  enters the stomach.  Stable moderately enlarged cardiopericardial silhouette. Worsened bilateral interstitial and airspace opacity with indistinct pulmonary vasculature. Deformity from left proximal humeral fracture noted.  IMPRESSION: 1. Worsened acute pulmonary edema. Tubes and lines unchanged imposition. ET tube satisfactorily positioned.   Electronically Signed   By: Herbie Baltimore M.D.   On: 06/24/2014 07:54   Dg Chest Port 1 View  06/23/2014   CLINICAL DATA:  Dialysis catheter placement.  Pulmonary edema.  EXAM: PORTABLE CHEST - 1 VIEW  COMPARISON:  06/23/2014 at 4:53 a.m. and 06/22/2014  FINDINGS: Endotracheal tube, left central catheter, NG tube, and new double lumen central venous catheter all appear in good position. The tip of the dialysis catheter is in the superior vena cava above the cavoatrial junction. No pneumothorax.  Pulmonary edema has improved. Heart size and pulmonary vascularity are normal.  Old healed fracture of the proximal left humerus. No acute osseous abnormality.  IMPRESSION: New dialysis catheter appears in good position. Improved pulmonary edema.   Electronically Signed   By: Geanie Cooley M.D.   On: 06/23/2014 12:17   Dg Chest Port 1 View  06/23/2014   CLINICAL DATA:  Assess endotracheal tube.  EXAM: PORTABLE CHEST - 1 VIEW  COMPARISON:  06/22/2014.  FINDINGS: Endotracheal tube ends at the clavicular heads. Gastric suction tube continues to/below the diaphragm. Unchanged positioning of left IJ catheter, tip at the distal left brachiocephalic vein.  Diffuse interstitial and airspace opacity is unchanged. There may be layering pleural effusions. No pneumothorax.  No cardiomegaly. Stable aortic contours, distorted from rightward rotation.  IMPRESSION: 1. Unchanged positioning of tubes and central line. 2. Stable pulmonary edema.   Electronically Signed   By: Tiburcio Pea M.D.   On: 06/23/2014 05:08   Dg Chest Port 1 View  06/22/2014   CLINICAL DATA:  Endotracheal tube  and orogastric tube. Evaluate position  EXAM: PORTABLE CHEST - 1 VIEW  COMPARISON:  Radiograph 06/17/2014  FINDINGS: Endotracheal tube is positioned with tip 3.5 cm from carinal. NG tube extends in the course of the esophagus with tip below the inferior  margin of the film. Left central venous line with tip in the mid brachiocephalic vein.  Normal cardiac silhouette. There is increased in perihilar vascular congestion and mild pulmonary edema. Bibasilar atelectasis similar. No pneumothorax  IMPRESSION: 1. Endotracheal tube and NG tube appear in good position. 2. Increase in pulmonary edema pattern. 3. Stable basilar atelectasis.   Electronically Signed   By: Genevive Bi M.D.   On: 06/22/2014 18:44   Dg Chest Port 1 View  06/17/2014   CLINICAL DATA:  Initial encounter for left IJ line placement.  EXAM: PORTABLE CHEST - 1 VIEW  COMPARISON:  One-view chest 06/16/2014.  FINDINGS: A left IJ line is in place. The tip is in the distal innominate vein and confluence of the SVC. A right-sided neck catheter remains in place. Heart size scratch the cardiac enlargement and mild edema scratch the cardiac enlargement is stable. Mild diffuse edema has increased slightly. The patient is rotated to the right.  IMPRESSION: 1. Interval placement of left IJ line. The tip is at the confluence of the innominate vein and SVC. 2. No significant pneumothorax. 3. Stable appearance of right neck catheter. 4. Increasing edema.   Electronically Signed   By: Gennette Pac M.D.   On: 06/17/2014 01:31   Dg Chest Port 1 View  06/16/2014   CLINICAL DATA:  Initial encounter for movement is of right IJ line. The line has been pulled back.  EXAM: PORTABLE CHEST - 1 VIEW  COMPARISON:  One-view chest 06/15/2014.  FINDINGS: The heart is enlarged. Mild edema has increased. A right IJ line has been pulled back and now terminates in the neck. It is impossible to know if the line remains intravascular or not. The visualized soft tissues and bony  thorax are unremarkable.  IMPRESSION: 1. The right IJ line has been pulled back and now terminates within the neck. This could be extra vascular. 2. Cardiomegaly with increasing edema. These results were called by telephone at the time of interpretation on 06/16/2014 at 9:53 pm to the floor nurse, who verbally acknowledged these results.   Electronically Signed   By: Gennette Pac M.D.   On: 06/16/2014 21:53   Dg Chest Port 1 View  06/15/2014   CLINICAL DATA:  Readjustment of right central line.  EXAM: PORTABLE CHEST - 1 VIEW  COMPARISON:  06/15/2014  FINDINGS: The right IJ catheter tip is in the projection of the cavoatrial junction. Stable cardiac enlargement and pulmonary vascular congestion. No pleural effusion or edema identified.  IMPRESSION: 1. The tip of the IJ catheter is in the projection of the cavoatrial junction. No pneumothorax identified after central line placement.   Electronically Signed   By: Signa Kell M.D.   On: 06/15/2014 18:32   Dg Chest Port 1 View  06/15/2014   CLINICAL DATA:  Right central line placement.  EXAM: PORTABLE CHEST - 1 VIEW  COMPARISON:  Earlier today.  FINDINGS: The previously demonstrated right jugular catheter is now curved back upon itself in the distal aspect of the superior vena cava. No pneumothorax. The cardiac silhouette remains mildly enlarged. Diffuse peribronchial thickening is unchanged. The lungs remain clear. Unremarkable bones.  IMPRESSION: 1. Right jugular catheter curved back upon itself in the distal superior vena cava. 2. Stable mild cardiomegaly and mild chronic bronchitic changes.   Electronically Signed   By: Gordan Payment M.D.   On: 06/15/2014 18:28   Dg Abd Portable 1v  06/22/2014   CLINICAL DATA:  Evaluate OG tube.  EXAM:  PORTABLE ABDOMEN - 1 VIEW  COMPARISON:  None.  FINDINGS: The examination is markedly degraded secondary to patient body habitus.  Enteric tube tip and side port projects over the expected location of the gastric antrum  given obliquity.  Paucity of bowel gas without definite evidence of obstruction. Nondiagnostic evaluation for pneumoperitoneum secondary supine positioning patient body habitus. No definite pneumatosis or portal venous gas.  No definite abnormal intra-abdominal calcifications.  IMPRESSION: Degraded examination demonstrates enteric tube tip and side port overlying expected location of the gastric antrum.   Electronically Signed   By: Simonne ComeJohn  Watts M.D.   On: 06/22/2014 18:42    Microbiology: Recent Results (from the past 240 hour(s))  CLOSTRIDIUM DIFFICILE BY PCR     Status: None   Collection Time    06/29/14  5:12 PM      Result Value Ref Range Status   C difficile by pcr NEGATIVE  NEGATIVE Final     Labs: Basic Metabolic Panel:  Recent Labs Lab 07/01/14 0433 07/02/14 0537 07/03/14 0515 07/04/14 0529 07/05/14 0057 07/05/14 0443  NA 139 135* 135* 138 137 140  K 3.7 3.8 3.7 3.6* 3.5* 3.3*  CL 93* 92* 90* 92* 90* 92*  CO2 31 30 31  33* 33* 32  GLUCOSE 285* 374* 301* 271* 216* 196*  BUN 54* 54* 59* 62* 64* 63*  CREATININE 2.00* 1.92* 1.97* 2.00* 2.08* 2.04*  CALCIUM 9.3 9.3 9.6 9.9 9.9 9.9  PHOS 4.1 3.9 3.6 3.7  --  3.8   Liver Function Tests:  Recent Labs Lab 07/01/14 0433 07/02/14 0537 07/03/14 0515 07/04/14 0529 07/05/14 0443  ALBUMIN 3.4* 3.2* 3.4* 3.5 3.5   No results found for this basename: LIPASE, AMYLASE,  in the last 168 hours No results found for this basename: AMMONIA,  in the last 168 hours CBC:  Recent Labs Lab 06/29/14 0440 07/02/14 0537  WBC 8.2 7.3  HGB 9.6* 10.0*  HCT 29.7* 30.5*  MCV 92.2 90.5  PLT 192 173   Cardiac Enzymes: No results found for this basename: CKTOTAL, CKMB, CKMBINDEX, TROPONINI,  in the last 168 hours BNP: BNP (last 3 results)  Recent Labs  09/10/13 1827 10/13/13 1916 06/09/14 1818  PROBNP 4050.0* 1519.0* 4358.0*   CBG:  Recent Labs Lab 07/04/14 0756 07/04/14 1227 07/04/14 1703 07/04/14 2237 07/05/14 0755   GLUCAP 236* 405* 375* 202* 199*       Signed:  Daphine Loch  [facility] 07/05/2014, 10:14 AM

## 2014-07-07 ENCOUNTER — Non-Acute Institutional Stay (SKILLED_NURSING_FACILITY): Payer: Managed Care, Other (non HMO) | Admitting: Internal Medicine

## 2014-07-07 ENCOUNTER — Encounter: Payer: Self-pay | Admitting: Internal Medicine

## 2014-07-07 DIAGNOSIS — I5033 Acute on chronic diastolic (congestive) heart failure: Secondary | ICD-10-CM

## 2014-07-07 DIAGNOSIS — J449 Chronic obstructive pulmonary disease, unspecified: Secondary | ICD-10-CM

## 2014-07-07 DIAGNOSIS — N189 Chronic kidney disease, unspecified: Secondary | ICD-10-CM

## 2014-07-07 DIAGNOSIS — I2510 Atherosclerotic heart disease of native coronary artery without angina pectoris: Secondary | ICD-10-CM

## 2014-07-07 DIAGNOSIS — E1122 Type 2 diabetes mellitus with diabetic chronic kidney disease: Secondary | ICD-10-CM

## 2014-07-07 DIAGNOSIS — E039 Hypothyroidism, unspecified: Secondary | ICD-10-CM

## 2014-07-07 DIAGNOSIS — I270 Primary pulmonary hypertension: Secondary | ICD-10-CM

## 2014-07-07 DIAGNOSIS — I272 Pulmonary hypertension, unspecified: Secondary | ICD-10-CM

## 2014-07-07 DIAGNOSIS — N183 Chronic kidney disease, stage 3 unspecified: Secondary | ICD-10-CM

## 2014-07-07 DIAGNOSIS — N19 Unspecified kidney failure: Secondary | ICD-10-CM

## 2014-07-07 DIAGNOSIS — I1310 Hypertensive heart and chronic kidney disease without heart failure, with stage 1 through stage 4 chronic kidney disease, or unspecified chronic kidney disease: Secondary | ICD-10-CM

## 2014-07-07 DIAGNOSIS — E1169 Type 2 diabetes mellitus with other specified complication: Secondary | ICD-10-CM

## 2014-07-07 DIAGNOSIS — E785 Hyperlipidemia, unspecified: Secondary | ICD-10-CM

## 2014-07-07 NOTE — Progress Notes (Signed)
Patient ID: Kimberly [name], female   DOB: August 20, 1956, 58 y.o.   MRN: [id number]  Provider:  Gwenith Spitz. Renato Gails, D.O., C.M.D. Location:  [facility]  PCP: [name], MD  Code Status: DNR, discussed with her today  Allergies  Allergen Reactions  . Epinephrine     Increased heart rate    Chief Complaint  Patient presents with  . New Admit To SNF    HPI: 58 y.o. female  with significant past medical history of morbid obesity, grade 1 diastolic dysfunction, IDDM, CAD, stage 3 CKD, chronic resp failure on 2L [place] at home who had been on lasix 40mg  daily until 1 month before her hospital admission.  At that point her lasix was increased to 160mg  po bid but she had gained 45 lbs.  Her orthopnea and PND had gotten much worse.  She required intubation and mechanical ventilation and is now back on O2 via [place]. She was treated with milrinone and lasix IV at the hospital.  She had PA cath by Dr. Gala Romney with markedly elevated PCWP and PAPs.  She suffered cardiorenal syndrome in context of her chf.  She also underwent CRRT on 10/13.  She was diuresed a total of 90lbs.  Creatinine stabilized to 1.9-2 (CKDIII) . She was then started on torsemide 40mg  po bid 10/23.  She needs to f/u with cardiology.    Her stay was complicated by diarrhea which was c diff negative and felt to be related to malabsorption.  Her diabetes has been poorly controlled and required increasing her lantus to 55 bid and novolog with meals was continued.  She says this is a new problem and she had good control until she got sick this last time.  ROS: Review of Systems  Constitutional: Positive for malaise/fatigue.  HENT: Negative for hearing loss.   Eyes:       Wears glasses  Respiratory: Positive for shortness of breath.   Cardiovascular: Negative for chest pain, palpitations and leg swelling.  Gastrointestinal: Positive for constipation. Negative for abdominal pain, blood in stool and melena.    Genitourinary: Negative for dysuria.  Musculoskeletal: Positive for back pain and joint pain. Negative for falls and myalgias.  Neurological: Positive for weakness. Negative for dizziness, loss of consciousness and headaches.  Psychiatric/Behavioral: Negative for memory loss.     Past Medical History  Diagnosis Date  . HYPERLIPIDEMIA   . HYPERTENSION   . CAD, NATIVE VESSEL     May 10, 2010 cath showed a hyperdynamic LV function, she had dominant circumflex anatomy with a 70-80% small OM1. She had diffuse diabetic plaque particularly in the distal LAD. She nondominant RCA.  Nondominant  . PVD     CEA  . DM   . COPD   . Edema   . Stroke   . CHF (congestive heart failure)     Preserved EF  . Complication of anesthesia     DIFFICULT WAKING   . Cellulitis 10/15/2013    BILATERAL  . Chronic kidney disease   . Diabetic neuropathy   . Anasarca 05/2014   Past Surgical History  Procedure Laterality Date  . Eye surgery    . Carotid endarterectomy     Social History:   reports that she quit smoking about 5 years ago. She has never used smokeless tobacco. She reports that she does not drink alcohol or use illicit drugs.  Family History  Problem Relation Age of Onset  . Cancer Mother  Breast, NHL  . Stroke Mother   . Peripheral vascular disease Father   . CAD Father 2630    Medications: Patient's Medications  New Prescriptions   No medications on file  Previous Medications   ACETAMINOPHEN (TYLENOL) 325 MG TABLET    Take 650 mg by mouth every 6 (six) hours as needed for mild pain.   ALBUTEROL (PROVENTIL HFA;VENTOLIN HFA) 108 (90 BASE) MCG/ACT INHALER    Inhale 1-2 puffs into the lungs every 6 (six) hours as needed for wheezing or shortness of breath.   AMLODIPINE (NORVASC) 5 MG TABLET    Take 1 tablet (5 mg total) by mouth daily.   ASPIRIN EC 81 MG TABLET    Take 81 mg by mouth daily.   INSULIN ASPART (NOVOLOG) 100 UNIT/ML INJECTION    Inject 6 Units into the skin 3  (three) times daily with meals.   INSULIN GLARGINE (LANTUS) 100 UNIT/ML INJECTION    Inject 55 Units into the skin 2 (two) times daily.   LEVOTHYROXINE (SYNTHROID, LEVOTHROID) 50 MCG TABLET    Take 1 tablet (50 mcg total) by mouth daily before breakfast.   METOPROLOL SUCCINATE (TOPROL-XL) 50 MG 24 HR TABLET    Take 1 tablet (50 mg total) by mouth daily. Take with or immediately following a meal.   MULTIPLE VITAMINS-MINERALS (MULTIVITAMIN PO)    Take 1 tablet by mouth daily.   POTASSIUM CHLORIDE SA (K-DUR,KLOR-CON) 20 MEQ TABLET    Take 2 tablets (40 mEq total) by mouth daily.   TORSEMIDE (DEMADEX) 20 MG TABLET    Take 2 tablets (40 mg total) by mouth 2 (two) times daily.  Modified Medications   No medications on file  Discontinued Medications   No medications on file     Physical Exam: Filed Vitals:   07/07/14 1443  BP: 132/62  Pulse: 77  Temp: 98.5 F (36.9 C)  Resp: 18  Height: 5\' 4"  (1.626 m)  Weight: 248 lb (112.492 kg)  Physical Exam  Constitutional: She is oriented to person, place, and time.  Obese white female seated in wheelchair in NAD  HENT:  Head: Normocephalic and atraumatic.  Right Ear: External ear normal.  Left Ear: External ear normal.  Nose: Nose normal.  Mouth/Throat: Oropharynx is clear and moist. No oropharyngeal exudate.  Eyes: Conjunctivae and EOM are normal. Pupils are equal, round, and reactive to light.  glasses  Neck: Normal range of motion. No JVD present.  Cardiovascular: Normal rate, regular rhythm, normal heart sounds and intact distal pulses.   Pulmonary/Chest: Effort normal and breath sounds normal. She has no rales.  Abdominal: Soft. Bowel sounds are normal. She exhibits no distension and no mass. There is no tenderness.  Musculoskeletal: Normal range of motion.  Says she has some right sided weakness from prior stroke, but not notable on exam  Neurological: She is alert and oriented to person, place, and time.  Skin: Skin is warm and dry.    Has scaly dry legs, no edema in feet or ankles at this time;  Has brawny skin changes bilaterally, mild erythema but no warmth or open areas to right leg  Psychiatric: She has a normal mood and affect.    Labs reviewed: Basic Metabolic Panel:  Recent Labs  16/06/9609/14/15 0420  06/25/14 0315  06/26/14 0400  07/03/14 0515 07/04/14 0529 07/05/14 0057 07/05/14 0443  NA 140  < > 139  < > 139  < > 135* 138 137 140  K 3.3*  < >  3.7  < > 4.4  < > 3.7 3.6* 3.5* 3.3*  CL 94*  < > 99  < > 101  < > 90* 92* 90* 92*  CO2 35*  < > 29  < > 26  < > 31 33* 33* 32  GLUCOSE 204*  < > 134*  < > 133*  < > 301* 271* 216* 196*  BUN 62*  < > 37*  < > 23  < > 59* 62* 64* 63*  CREATININE 1.76*  < > 1.19*  < > 1.07  < > 1.97* 2.00* 2.08* 2.04*  CALCIUM 9.0  < > 8.8  < > 9.1  < > 9.6 9.9 9.9 9.9  MG 2.1  --  2.2  --  2.4  --   --   --   --   --   PHOS  --   < > 2.4  < > 1.9*  < > 3.6 3.7  --  3.8  < > = values in this interval not displayed. Liver Function Tests:  Recent Labs  06/12/14 0400 06/13/14 0404  06/24/14 0420  07/03/14 0515 07/04/14 0529 07/05/14 0443  AST 16 29  --  15  --   --   --   --   ALT 9 11  --  7  --   --   --   --   ALKPHOS 152* 127*  --  101  --   --   --   --   BILITOT 1.2 0.8  --  1.2  --   --   --   --   PROT 8.1 8.1  --  7.6  --   --   --   --   ALBUMIN 3.0* 3.0*  < > 3.1*  < > 3.4* 3.5 3.5  < > = values in this interval not displayed. No results found for this basename: LIPASE, AMYLASE,  in the last 8760 hours No results found for this basename: AMMONIA,  in the last 8760 hours CBC:  Recent Labs  06/22/14 0604 06/23/14 0400 06/23/14 0500  06/26/14 0400 06/29/14 0440 07/02/14 0537  WBC 9.1 18.4* 13.3*  < > 13.4* 8.2 7.3  NEUTROABS 7.6 13.8* 11.8*  --   --   --   --   HGB 8.2* 11.5* 8.7*  < > 8.7* 9.6* 10.0*  HCT 26.4* 36.2 27.9*  < > 28.5* 29.7* 30.5*  MCV 96.4 94.3 94.3  < > 96.3 92.2 90.5  PLT 185 384 201  < > 203 192 173  < > = values in this interval not  displayed. Cardiac Enzymes:  Recent Labs  06/09/14 2310 06/10/14 0400 06/10/14 1035  TROPONINI <0.30 <0.30 <0.30   BNP: No components found with this basename: POCBNP,  CBG:  Recent Labs  07/04/14 2237 07/05/14 0755 07/05/14 1200  GLUCAP 202* 199* 197*   Lab Results  Component Value Date   TSH 9.120* 06/24/2014   No results found for this basename: CHOL, HDL, LDLCALC, LDLDIRECT, TRIG, CHOLHDL   Imaging and Procedures: Reviewed in d/c summary  Assessment/Plan 1. Acute on chronic diastolic CHF (congestive heart failure), NYHA class 1 -has stabilized -cont daily weights -2 liter fluid restriction and concho nas diet -discussed avoiding sodas (one was on nightstand, but said it was her husband's) -here for rehab due to weakness from this during her hospitalization -says she needs to stay somewhere long term b/c her home is not safe -f/u with cardiology 2.  Cardiorenal syndrome with renal failure -f/u bmp 3. Atherosclerosis of native coronary artery of native heart without angina pectoris -cont secondary prevention  4. Pulmonary HTN -on cath -cont current regimen 5. Chronic obstructive pulmonary disease, unspecified COPD, unspecified chronic bronchitis type -stable, smoked in the past -no changes needed -humidity to be added to oxygen 6. Hypothyroidism, unspecified hypothyroidism type -will need f/u in 6 wks due to elevated tsh during hospital stay -cont synthroid  7. Type 2 diabetes mellitus with diabetic chronic kidney disease -check cbgs ac and hs -again reinforced following diet -will be getting some exercise with therapy 8. CKD (chronic kidney disease), stage III -f/u bmp -avoid nephrotoxic agents like nsaids 9. Morbid obesity -needs to lose weight to help all of her chronic illnesses -may lose some at facility due to higher level of activity than at home 10. Hyperlipidemia associated with type 2 diabetes mellitus -is not on a statin--need to check with  her as to why this is -will need lipid panel at routine visit  Functional status:  Dependent in bathing, dressing, transfers at present (needs help with all, but can easily participate);  Tells me she has a pressure ulcer on her buttocks also from trauma 30 years ago that broke her coccyx  Family/ staff Communication: seen with unit supervisor  Labs/tests ordered:  Bmp in 1 wk

## 2014-07-09 ENCOUNTER — Telehealth: Payer: Self-pay | Admitting: Cardiology

## 2014-07-09 NOTE — Telephone Encounter (Signed)
New message    tcm on  11/5 @ 8:30 with scott weaver. Per brian hager pa

## 2014-07-13 ENCOUNTER — Encounter: Payer: Self-pay | Admitting: Physician Assistant

## 2014-07-15 ENCOUNTER — Non-Acute Institutional Stay (SKILLED_NURSING_FACILITY): Payer: Managed Care, Other (non HMO) | Admitting: Internal Medicine

## 2014-07-15 DIAGNOSIS — J449 Chronic obstructive pulmonary disease, unspecified: Secondary | ICD-10-CM

## 2014-07-15 DIAGNOSIS — I5032 Chronic diastolic (congestive) heart failure: Secondary | ICD-10-CM

## 2014-07-16 ENCOUNTER — Ambulatory Visit (INDEPENDENT_AMBULATORY_CARE_PROVIDER_SITE_OTHER): Payer: Managed Care, Other (non HMO) | Admitting: Physician Assistant

## 2014-07-16 ENCOUNTER — Encounter: Payer: Self-pay | Admitting: Physician Assistant

## 2014-07-16 VITALS — BP 160/70 | HR 83 | Ht 64.0 in | Wt 247.0 lb

## 2014-07-16 DIAGNOSIS — I270 Primary pulmonary hypertension: Secondary | ICD-10-CM

## 2014-07-16 DIAGNOSIS — R0683 Snoring: Secondary | ICD-10-CM

## 2014-07-16 DIAGNOSIS — I6523 Occlusion and stenosis of bilateral carotid arteries: Secondary | ICD-10-CM

## 2014-07-16 DIAGNOSIS — J449 Chronic obstructive pulmonary disease, unspecified: Secondary | ICD-10-CM

## 2014-07-16 DIAGNOSIS — I5032 Chronic diastolic (congestive) heart failure: Secondary | ICD-10-CM

## 2014-07-16 DIAGNOSIS — E039 Hypothyroidism, unspecified: Secondary | ICD-10-CM

## 2014-07-16 DIAGNOSIS — I10 Essential (primary) hypertension: Secondary | ICD-10-CM

## 2014-07-16 DIAGNOSIS — N189 Chronic kidney disease, unspecified: Secondary | ICD-10-CM

## 2014-07-16 DIAGNOSIS — I272 Pulmonary hypertension, unspecified: Secondary | ICD-10-CM

## 2014-07-16 DIAGNOSIS — I2510 Atherosclerotic heart disease of native coronary artery without angina pectoris: Secondary | ICD-10-CM

## 2014-07-16 DIAGNOSIS — I050 Rheumatic mitral stenosis: Secondary | ICD-10-CM

## 2014-07-16 DIAGNOSIS — N183 Chronic kidney disease, stage 3 unspecified: Secondary | ICD-10-CM

## 2014-07-16 DIAGNOSIS — E1122 Type 2 diabetes mellitus with diabetic chronic kidney disease: Secondary | ICD-10-CM

## 2014-07-16 DIAGNOSIS — E785 Hyperlipidemia, unspecified: Secondary | ICD-10-CM

## 2014-07-16 NOTE — Patient Instructions (Signed)
[facility] DR. CLANCE OR DR. SOOD; DX SNORING, COPD  Your physician recommends that you schedule a follow-up appointment in: 2-3 WEEKS SCOTT WEAVER, [facility]  Your physician recommends that you schedule a follow-up appointment in: 8 WEEKS WITH DR, Jens SomRENSHAW AT [facility]  Your physician recommends that you continue on your current medications as directed. Please refer to the Current Medication list given to you today.

## 2014-07-16 NOTE — Progress Notes (Signed)
Cardiology Office Note   Date:  07/16/2014   ID:  AIDAH FORQUER, DOB 1956/03/30, MRN [id number]  PCP:  [name], MD  Cardiologist:  Dr. Olga Millers     History of Present Illness: Kimberly [name] is a 58 y.o. female with a history of morbid obesity, diastolic HF, diabetes, CAD, CKD stage III and chronic respiratory failure. She was admitted 9/29-10/25 with anasarca in the setting of acute on chronic diastolic CHF and acute on chronic renal failure.  She was off of her diuretics for some time.  Prior to admission, she had gained 50 lbs.  She had no improvement with outpatient initiation of her diuretics.  She was followed by the Advanced HF team.  She was diuresed with Lasix drip and milrinone. 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 She developed worsening renal function with cardiorenal syndrome. She was evaluated by nephrology and required dialysis ([facility]). She required brief intubation for worsening hypercarbic respiratory failure. She was eventually transitioned off of dialysis and back on to oral diuretics.  She was down 70+ lbs at DC.  Of note, she had markedly elevated TSH upon admission. Synthroid was initiated. She was discharged to SNF.  She is currently at [facility].  She tells me that her weights have been stable. She sleeps on an incline chronically. She denies PND. LE edema remains stable. Dyspnea also remain stable. She is NYHA 2b-3. She has occasional atypical chest pains. He denies exertional symptoms. She denies syncope.  Studies:  - LHC (8/11):  EF 70%, proximal LAD 30% (diabetic appearance), distal LAD 70%, proximal OM1 70-80% (very small), OM2 50%  - Echo (9/15):  EF 55-60%, normal wall motion, grade 2 diastolic dysfunction, moderate mitral stenosis (mean 7 mmHg), mild LAE, normal RV function, mild TR, PASP 77 mmHg (severe pulmonary hypertension)  -  Carotid US (3/15):  RICA 40-59%, left CEA patent (velocities suggest 40-59%)  - MUGA (5/13):  Good quality study. EF 62%  - RHC (10/15):  RA 2, PA 90/33 (53), PCWP 31, Thermo CO/CI 5.6/2.4, PVR 4.0 WU, SVR 792; Severe biventricular volume overload. With severe mixed pulmonary HTN (mostly pulmonary venous HTN)  Recent Labs/Images: 06/09/2014: Pro B Natriuretic peptide (BNP) 4358.0* 06/24/2014: ALT 7; TSH 9.120* 07/02/2014: Hemoglobin 10.0* 07/05/2014: BUN 63*; Creatinine 2.04*; Potassium 3.3*; Sodium 140     Wt Readings from Last 3 Encounters:  07/07/14 248 lb (112.492 kg)  07/04/14 249 lb 4.8 oz (113.082 kg)  12/02/13 265 lb (120.203 kg)     Past Medical History  Diagnosis Date  . HYPERLIPIDEMIA   . HYPERTENSION   . CAD, NATIVE VESSEL     May 10, 2010 cath showed a hyperdynamic LV function, she had dominant circumflex anatomy with a 70-80% small OM1. She had diffuse diabetic plaque particularly in the distal LAD. She nondominant RCA.  Nondominant  . PVD     CEA  . DM   . COPD   . Edema   . Stroke   . CHF (congestive heart failure)     Preserved EF  . Complication of anesthesia     DIFFICULT WAKING   . Cellulitis 10/15/2013    BILATERAL  . Chronic kidney disease   . Diabetic neuropathy   . Anasarca 05/2014    Current Outpatient Prescriptions  Medication Sig Dispense Refill  . acetaminophen (TYLENOL) 325 MG tablet Take 650 mg by mouth every 6 (six) hours as needed for  mild pain.    Marland Kitchen. albuterol (PROVENTIL HFA;VENTOLIN HFA) 108 (90 BASE) MCG/ACT inhaler Inhale 1-2 puffs into the lungs every 6 (six) hours as needed for wheezing or shortness of breath.    Marland Kitchen. amLODipine (NORVASC) 5 MG tablet Take 1 tablet (5 mg total) by mouth daily.    Marland Kitchen. aspirin EC 81 MG tablet Take 81 mg by mouth daily.    . insulin aspart (NOVOLOG) 100 UNIT/ML injection Inject 6 Units into the skin 3 (three) times daily with meals.    . insulin glargine (LANTUS) 100 UNIT/ML injection Inject 55 Units into  the skin 2 (two) times daily.    Marland Kitchen. levothyroxine (SYNTHROID, LEVOTHROID) 50 MCG tablet Take 1 tablet (50 mcg total) by mouth daily before breakfast.    . metoprolol succinate (TOPROL-XL) 50 MG 24 hr tablet Take 1 tablet (50 mg total) by mouth daily. Take with or immediately following a meal.    . Multiple Vitamins-Minerals (MULTIVITAMIN PO) Take 1 tablet by mouth daily.    . potassium chloride SA (K-DUR,KLOR-CON) 20 MEQ tablet Take 2 tablets (40 mEq total) by mouth daily.    Marland Kitchen. torsemide (DEMADEX) 20 MG tablet Take 2 tablets (40 mg total) by mouth 2 (two) times daily.     No current facility-administered medications for this visit.     Allergies:   Epinephrine   Social History:  The patient  reports that she quit smoking about 5 years ago. She has never used smokeless tobacco. She reports that she does not drink alcohol or use illicit drugs.   Family History:  The patient's family history includes CAD (age of onset: 3530) in her father; Cancer in her mother; Peripheral vascular disease in her father; Stroke in her mother.   ROS:  Please see the history of present illness.   She's been constipated. She denies melena or hematochezia.   All other systems reviewed and negative.    PHYSICAL EXAM: VS:  BP 160/70 mmHg  Pulse 83  Ht 5\' 4"  (1.626 m)  Wt 247 lb (112.038 kg)  BMI 42.38 kg/m2 Well nourished, well developed, in no acute distress HEENT: normal Neck: no JVD Cardiac:  normal S1, S2; RRR; 1-2/6 systolic murmurLUSB Lungs:  Decreased breath sounds bilaterally, no wheezing, rhonchi or rales Abd: soft, nontender, no hepatomegaly Ext: trace -1+ tight bilateral LE edema Skin: warm and dry Neuro:  CNs 2-12 intact, no focal abnormalities noted  EKG:  NSR, HR 83, LBBB      ASSESSMENT AND PLAN:  1.  Chronic diastolic CHF (congestive heart failure):  Overall, her volume appears to be stable. Continue current dose of diuretics. Request recent BMET from the nursing home. 2.  Coronary Artery  Disease:  No angina. Continue aspirin. She is intolerant to statins. 3.  Morbid obesity:  She likely has an element of obesity hypoventilation syndrome. 4.  Hypothyroidism, unspecified hypothyroidism type:  Follow-up with primary care. 5.  Type 2 diabetes mellitus with diabetic chronic kidney disease:  Sugars have been 300 400. Insulin has been adjusted at the nursing home. 6.  Chronic obstructive pulmonary disease, unspecified COPD, unspecified chronic bronchitis type:  She remains on O2. She has symptoms that sound consistent with OSA. She likely has obesity hypoventilation syndrome. She would benefit from referral to pulmonology. I will arrange this. 7.  CKD (chronic kidney disease), stage III:  I will obtain recent BMET from the nursing home. 8.  Pulmonary HTN:  This was primarily venous pulmonary hypertension. As noted, she likely  sleep apnea. I will refer her to pulmonology. 9.  Carotid stenosis, bilateral:  Follow-up with vascular surgery as planned. 10. Hyperlipidemia:  She is intolerant to statins. 11. Mitral valve stenosis, moderate:  This will be followed with an echocardiogram in 6-12 months. 12. HTN:  Elevated today.  Continue to monitor.  Increase Amlodipine if remains elevated.    Disposition:   FU with me in 2-3 weeks. I will arrange follow-up with Dr. Jens Somrenshaw. I will review with Dr. Jens Somrenshaw and Dr. Gala RomneyBensimhon regarding follow-up in the Advanced HF clinic.   Signed, Brynda RimScott Lusero Nordlund, PA-C, MHS 07/16/2014 8:11 AM    [facility] [address], [place], [place]  [address] Phone: [phone]; Fax: [phone]

## 2014-07-20 NOTE — Progress Notes (Addendum)
Patient ID: [name], female   DOB: [birth year], 58 y.o.   MRN: [id number]               PROGRESS NOTE  DATE:  07/15/2014    [facility]      LEVEL OF CARE:   SNF   Acute Visit/Discharge Visit        CHIEF COMPLAINT:  Pre-discharge review.     HISTORY OF PRESENT ILLNESS:  This is a patient who was recently admitted to the facility with a history of diastolic heart failure, chronic respiratory failure, on O2 2 L at home.     She was admitted to hospital with rapid weight gain.  She had orthopnea and PND.  In the hospital, she was treated with Milrinone and Lasix IV.  She had a PA cath which showed markedly elevated pulmonary capillary wedge pressures and pulmonary artery pressures.  She suffered cardiorenal syndrome in the context of her CHF.  She was started on torsemide 40 b.i.d.    She is now ready to go home from the facility.  Her weight is right around the 250-pound range.  This has not changed all that much.    PHYSICAL EXAMINATION:   CHEST/RESPIRATORY:  Mildly reduced air entry, but fairly clear.  There are no crackles or wheezes.   CARDIOVASCULAR:  CARDIAC:   Heart sounds are normal.  JVP is not elevated.  There is no S3.   EDEMA/VARICOSITIES:  She does not have any coccyx edema.  She does have edema below the knees, although I think this is mostly venous stasis.    ASSESSMENT/PLAN:  Diastolic heart failure.  The patient appears to be fairly stable from this regard.  She is on a fluid restriction as well as Demadex.  I do not have access to her lab work at this junction although she looks clinically stable at the bedside, although I will see if I can look at her BMP.    She has COPD and pulmonary hypertension.  This does not appear to be unstable.  She is on oxygen.  Her O2 sat was 99% on 2 L.    We have written orders for home health.  She will need a 3-in-1 bariatric commode, a wheelchair.  She has a walker at home.

## 2014-07-27 ENCOUNTER — Encounter: Payer: Self-pay | Admitting: *Deleted

## 2014-07-27 ENCOUNTER — Telehealth: Payer: Self-pay | Admitting: *Deleted

## 2014-07-27 NOTE — Telephone Encounter (Signed)
s/w pt about BP readings, She said HHRN only comes out 1-2 x wkly, she checks her BP. 11/11  155/75; 11/12  148/68; 11/13 158/73; 11/14 143/62;  11/15 140/63; 11/16 144/66. Will forward to Loews CorporationScott W. PA for recommendations.

## 2014-07-27 NOTE — Telephone Encounter (Signed)
Follow up:  Pt is returning Carol's call. Please call back  Thanks

## 2014-07-27 NOTE — Telephone Encounter (Signed)
lmptcb to ask if she has HHRN coming out to her house and if so who so that we may see if they will check her BP and call with readings.

## 2014-07-28 NOTE — Telephone Encounter (Signed)
Increase Toprol-XL to 75 mg daily. Tereso NewcomerScott Kasra Melvin, PA-C   07/28/2014 10:14 PM

## 2014-07-30 MED ORDER — METOPROLOL SUCCINATE ER 50 MG PO TB24: ORAL_TABLET | ORAL | Status: DC

## 2014-07-30 NOTE — Telephone Encounter (Signed)
pt notified about dose increase in Toprol xl to 75 mg daily, new Rx called into [facility] today. Pt aware to monitor BP and bring readings in to f/u 11/24 with Lorin PicketScott W. PA, pt said ok and thank you.

## 2014-07-30 NOTE — Addendum Note (Signed)
Addended by: Tarri FullerFIATO, Dallon Dacosta M on: 07/30/2014 08:32 AM   Modules accepted: Orders

## 2014-08-03 ENCOUNTER — Encounter: Payer: Self-pay | Admitting: Podiatry

## 2014-08-03 ENCOUNTER — Ambulatory Visit (INDEPENDENT_AMBULATORY_CARE_PROVIDER_SITE_OTHER): Payer: Managed Care, Other (non HMO) | Admitting: Podiatry

## 2014-08-03 VITALS — BP 148/81 | HR 81 | Resp 16 | Ht 64.0 in | Wt 255.0 lb

## 2014-08-03 DIAGNOSIS — Z8679 Personal history of other diseases of the circulatory system: Secondary | ICD-10-CM

## 2014-08-03 DIAGNOSIS — E0842 Diabetes mellitus due to underlying condition with diabetic polyneuropathy: Secondary | ICD-10-CM

## 2014-08-03 DIAGNOSIS — I739 Peripheral vascular disease, unspecified: Secondary | ICD-10-CM

## 2014-08-03 DIAGNOSIS — B351 Tinea unguium: Secondary | ICD-10-CM

## 2014-08-03 DIAGNOSIS — E0841 Diabetes mellitus due to underlying condition with diabetic mononeuropathy: Secondary | ICD-10-CM

## 2014-08-03 NOTE — Progress Notes (Signed)
   Subjective:    Patient ID: Kimberly ManilaSusan K Gindlesperger, female    DOB: 1955/09/26, 58 y.o.   MRN: [id number]  HPI Comments: N toenail problems L 10 toenails D and O long-term C thickened, discolored toenails A diabetic, and difficult to cut T none  This patient was last treated in office on 03/01/2010     Review of Systems  Skin:       [facility] right lateral heel  All other systems reviewed and are negative.      Objective:   Physical Exam This patient is seated in a wheelchair appears orientated 3 as unable to transfer to treatment chair Patient's husband is present  Vascular: DP pulses 1/4 bilaterally  PT pulses 1/4 bilaterally Pitting edema ankles dorsal feet bilaterally  Neurological: Sensation to 10 g monofilament wire intact 0/5 bilaterally Vibratory sensation nonreactive bilaterally Ankle reflexes trace reactive bilaterally  Dermatological: Eschar lateral right heel without surrounding erythema, edema, drainage, warmth or malodor The toenails are brittle, elongated, incurvated, discolored, hypertrophic 6-10  Musculoskeletal: No deformities noted Symmetrical range of motion of ankle, midtarsal, subtalar joints bilaterally        Assessment & Plan:   Assessment: Loss of protective sensation bilaterally Diabetic peripheral neuropathy Diminished pedal pulses with a history of peripheral vascular disease Onychomycoses Pre-ulcerative eschar right heel  Plan: Debrided toenails 10 without ableeding Patient advised to use existing heel protector on the right to prevent further recurrence of right heel ulcer  Reappoint at three-month intervals

## 2014-08-03 NOTE — Patient Instructions (Signed)
Wear heel protector on right foot to protect breakdown of skin ulcer on the outside of the right heel  Diabetes and Foot Care Diabetes may cause you to have problems because of poor blood supply (circulation) to your feet and legs. This may cause the skin on your feet to become thinner, break easier, and heal more slowly. Your skin may become dry, and the skin may peel and crack. You may also have nerve damage in your legs and feet causing decreased feeling in them. You may not notice minor injuries to your feet that could lead to infections or more serious problems. Taking care of your feet is one of the most important things you can do for yourself.  HOME CARE INSTRUCTIONS  Wear shoes at all times, even in the house. Do not go barefoot. Bare feet are easily injured.  Check your feet daily for blisters, cuts, and redness. If you cannot see the bottom of your feet, use a mirror or ask someone for help.  Wash your feet with warm water (do not use hot water) and mild soap. Then pat your feet and the areas between your toes until they are completely dry. Do not soak your feet as this can dry your skin.  Apply a moisturizing lotion or petroleum jelly (that does not contain alcohol and is unscented) to the skin on your feet and to dry, brittle toenails. Do not apply lotion between your toes.  Trim your toenails straight across. Do not dig under them or around the cuticle. File the edges of your nails with an emery board or nail file.  Do not cut corns or calluses or try to remove them with medicine.  Wear clean socks or stockings every day. Make sure they are not too tight. Do not wear knee-high stockings since they may decrease blood flow to your legs.  Wear shoes that fit properly and have enough cushioning. To break in new shoes, wear them for just a few hours a day. This prevents you from injuring your feet. Always look in your shoes before you put them on to be sure there are no objects  inside.  Do not cross your legs. This may decrease the blood flow to your feet.  If you find a minor scrape, cut, or break in the skin on your feet, keep it and the skin around it clean and dry. These areas may be cleansed with mild soap and water. Do not cleanse the area with peroxide, alcohol, or iodine.  When you remove an adhesive bandage, be sure not to damage the skin around it.  If you have a wound, look at it several times a day to make sure it is healing.  Do not use heating pads or hot water bottles. They may burn your skin. If you have lost feeling in your feet or legs, you may not know it is happening until it is too late.  Make sure your health care provider performs a complete foot exam at least annually or more often if you have foot problems. Report any cuts, sores, or bruises to your health care provider immediately. SEEK MEDICAL CARE IF:   You have an injury that is not healing.  You have cuts or breaks in the skin.  You have an ingrown nail.  You notice redness on your legs or feet.  You feel burning or tingling in your legs or feet.  You have pain or cramps in your legs and feet.  Your legs or  feet are numb.  Your feet always feel cold. SEEK IMMEDIATE MEDICAL CARE IF:   There is increasing redness, swelling, or pain in or around a wound.  There is a red line that goes up your leg.  Pus is coming from a wound.  You develop a fever or as directed by your health care provider.  You notice a bad smell coming from an ulcer or wound. Document Released: 08/25/2000 Document Revised: 04/30/2013 Document Reviewed: 02/04/2013 [facility] Patient Information 2015 [place], [place]. This information is not intended to replace advice given to you by your health care provider. Make sure you discuss any questions you have with your health care provider.

## 2014-08-04 ENCOUNTER — Ambulatory Visit: Payer: Managed Care, Other (non HMO) | Admitting: Physician Assistant

## 2014-08-04 ENCOUNTER — Encounter: Payer: Self-pay | Admitting: Podiatry

## 2014-08-05 ENCOUNTER — Encounter: Payer: Self-pay | Admitting: Pulmonary Disease

## 2014-08-05 ENCOUNTER — Ambulatory Visit (INDEPENDENT_AMBULATORY_CARE_PROVIDER_SITE_OTHER): Payer: Managed Care, Other (non HMO) | Admitting: Pulmonary Disease

## 2014-08-05 VITALS — BP 118/60 | HR 85 | Temp 98.3°F | Ht 64.0 in | Wt 259.4 lb

## 2014-08-05 DIAGNOSIS — E662 Morbid (severe) obesity with alveolar hypoventilation: Secondary | ICD-10-CM | POA: Insufficient documentation

## 2014-08-05 DIAGNOSIS — R0609 Other forms of dyspnea: Secondary | ICD-10-CM

## 2014-08-05 DIAGNOSIS — J9611 Chronic respiratory failure with hypoxia: Secondary | ICD-10-CM

## 2014-08-05 NOTE — Patient Instructions (Signed)
Will schedule for breathing studies, and also a sleep study Will arrange followup once the results are available.

## 2014-08-05 NOTE — Assessment & Plan Note (Signed)
The patient has multifactorial dyspnea on exertion. She has known chronic diastolic heart failure with mitral valve disease and elevated pulmonary pressures, she is morbidly obese and deconditioned, and it is unclear if she has COPD or not. She will obviously need pulmonary function studies to evaluate for this.

## 2014-08-05 NOTE — Progress Notes (Signed)
   Subjective:    Patient ID: Kimberly [name], female    DOB: 30-May-1956, 58 y.o.   MRN: [id number]  HPI The patient is a 58 year old female who I've been asked to see for possible obstructive sleep apnea and COPD. She is morbidly obese, and recently admitted to the hospital with acute on chronic hypercarbic respiratory failure. She has known chronic diastolic heart failure and mitral valve disease, and was found to have a very elevated pulmonary capillary wedge pressure during that admission. She was diuresed aggressively with good improvement. She was noted to have significant hypercarbia that was well compensated, and clearly this is consistent with obesity hypoventilation. The question has been raised whether she may have sleep apnea, but her bed partner denies snoring, and she feels that she is rested in the mornings upon arising. She also denies inappropriate daytime sleepiness. However, she has gained almost 50 pounds over the last 2 years. She has chronic dyspnea on exertion with only minimal activity, but denies any significant coughing. She does have a history of smoking, but has not done so since 2007. She has been labeled as having COPD, but has never had pulmonary function studies. She also notes chronic lower extremity edema.   Review of Systems  Constitutional: Negative for fever and unexpected weight change.  HENT: Positive for congestion, postnasal drip and trouble swallowing. Negative for dental problem, ear pain, nosebleeds, rhinorrhea, sinus pressure, sneezing and sore throat.   Eyes: Negative for redness and itching.  Respiratory: Positive for chest tightness and shortness of breath. Negative for cough and wheezing.   Cardiovascular: Positive for leg swelling. Negative for palpitations.  Gastrointestinal: Negative for nausea and vomiting.  Genitourinary: Negative for dysuria.  Musculoskeletal: Negative for joint swelling.  Skin: Negative for rash.  Neurological: Negative for  headaches.  Hematological: Does not bruise/bleed easily.  Psychiatric/Behavioral: Negative for dysphoric mood. The patient is not nervous/anxious.        Objective:   Physical Exam Constitutional:  Morbidly obese female, no acute distress  HENT:  Nares patent without discharge  Oropharynx without exudate, palate and uvula are elongated.  Eyes:  Perrla, eomi, no scleral icterus  Neck:  No JVD, no TMG  Cardiovascular:  Normal rate, regular rhythm, no rubs or gallops.  No murmurs        Unable to palpate distal pulses.  Pulmonary :  Normal breath sounds, no stridor or respiratory distress   No rhonchi, or wheezing, +bibasilar crackles.  Abdominal:  Soft, nondistended, bowel sounds present.  No tenderness noted.   Musculoskeletal:  3+ lower extremity edema noted.  Lymph Nodes:  No cervical lymphadenopathy noted  Skin:  No cyanosis noted  Neurologic:  Alert, appropriate, moves all 4 extremities without obvious deficit.         Assessment & Plan:

## 2014-08-05 NOTE — Assessment & Plan Note (Signed)
The patient clearly has obesity hypoventilation syndrome, and will benefit from a positive pressure device during sleep as well as weight loss. She will need to go through the workup in order to qualify for a bilevel device, and this includes a sleep study as well as pulmonary function studies. Will see her back to initiate therapy once the results are available.

## 2014-08-07 ENCOUNTER — Encounter: Payer: Self-pay | Admitting: Cardiology

## 2014-08-07 NOTE — Telephone Encounter (Signed)
Spoke with Kimberly [name], Aware of dr Ludwig Clarkscrenshaw's recommendations.

## 2014-08-07 NOTE — Telephone Encounter (Signed)
New Message   Pt had 3 lb weight gain since tues., Vital signs look good, pt is stable, will follow RN lead, please call back and advice

## 2014-08-07 NOTE — Telephone Encounter (Signed)
Take additional 40 mg demadex po x1 Kimberly [name]

## 2014-08-07 NOTE — Telephone Encounter (Signed)
New Msg   Megan from [facility] returning call. Requests to be contacted at [phone].

## 2014-08-07 NOTE — Telephone Encounter (Signed)
Left message for Kimberly [name] to call

## 2014-08-07 NOTE — Telephone Encounter (Signed)
Spoke with megan, patient is up 3 lbs since Tuesday but admits to going to [employer] to eat yesterday. She does not c/o SOB but seemed to be SOB when she came to the door and it took her about 5 to 10 mins to recover [facility]. Her O2 sat is 93% on oxygen. Her stomach is tight and she has 3+ edema in her legs. She is currently taking torsemide 40 mg bid. Will forward for dr Jens Somcrenshaw review

## 2014-08-07 NOTE — Telephone Encounter (Signed)
This encounter was created in error - please disregard.

## 2014-08-13 ENCOUNTER — Ambulatory Visit (HOSPITAL_COMMUNITY)
Admission: RE | Admit: 2014-08-13 | Discharge: 2014-08-13 | Disposition: A | Discharge status: Home / Self Care | Payer: Managed Care, Other (non HMO) | Source: Ambulatory Visit | Attending: Pulmonary Disease | Admitting: Pulmonary Disease

## 2014-08-13 DIAGNOSIS — R0609 Other forms of dyspnea: Secondary | ICD-10-CM | POA: Insufficient documentation

## 2014-08-13 LAB — PULMONARY FUNCTION TEST
DL/VA % PRED: 57 %
DL/VA: 2.77 ml/min/mmHg/L
DLCO unc % pred: 23 %
DLCO unc: 5.66 ml/min/mmHg
FEF 25-75 POST: 2.17 L/s
FEF 25-75 Pre: 1.71 L/sec
FEF2575-%Change-Post: 27 %
FEF2575-%PRED-PRE: 72 %
FEF2575-%Pred-Post: 91 %
FEV1-%Change-Post: 7 %
FEV1-%PRED-PRE: 40 %
FEV1-%Pred-Post: 43 %
FEV1-PRE: 1.05 L
FEV1-Post: 1.12 L
FEV1FVC-%Change-Post: -1 %
FEV1FVC-%PRED-PRE: 116 %
FEV6-%Change-Post: 9 %
FEV6-%Pred-Post: 39 %
FEV6-%Pred-Pre: 35 %
FEV6-Post: 1.26 L
FEV6-Pre: 1.15 L
FEV6FVC-%Pred-Post: 104 %
FEV6FVC-%Pred-Pre: 104 %
FVC-%Change-Post: 9 %
FVC-%PRED-POST: 37 %
FVC-%Pred-Pre: 34 %
FVC-POST: 1.26 L
FVC-Pre: 1.15 L
POST FEV1/FVC RATIO: 89 %
Post FEV6/FVC ratio: 100 %
Pre FEV1/FVC ratio: 91 %
Pre FEV6/FVC Ratio: 100 %
RV % pred: 59 %
RV: 1.17 L
TLC % pred: 51 %
TLC: 2.61 L

## 2014-08-13 MED ORDER — ALBUTEROL SULFATE (2.5 MG/3ML) 0.083% IN NEBU
2.5000 mg | INHALATION_SOLUTION | Freq: Once | RESPIRATORY_TRACT | Status: AC
  Administered 2014-08-13: 2.5 mg via RESPIRATORY_TRACT

## 2014-08-14 ENCOUNTER — Other Ambulatory Visit: Payer: Self-pay | Admitting: Internal Medicine

## 2014-08-25 ENCOUNTER — Encounter: Payer: Self-pay | Admitting: Physician Assistant

## 2014-08-25 ENCOUNTER — Ambulatory Visit (INDEPENDENT_AMBULATORY_CARE_PROVIDER_SITE_OTHER): Payer: Managed Care, Other (non HMO) | Admitting: Physician Assistant

## 2014-08-25 VITALS — BP 134/60 | HR 82 | Ht 64.0 in | Wt 268.0 lb

## 2014-08-25 DIAGNOSIS — I10 Essential (primary) hypertension: Secondary | ICD-10-CM

## 2014-08-25 DIAGNOSIS — I5032 Chronic diastolic (congestive) heart failure: Secondary | ICD-10-CM

## 2014-08-25 DIAGNOSIS — N183 Chronic kidney disease, stage 3 unspecified: Secondary | ICD-10-CM

## 2014-08-25 DIAGNOSIS — I270 Primary pulmonary hypertension: Secondary | ICD-10-CM

## 2014-08-25 DIAGNOSIS — E662 Morbid (severe) obesity with alveolar hypoventilation: Secondary | ICD-10-CM

## 2014-08-25 DIAGNOSIS — I050 Rheumatic mitral stenosis: Secondary | ICD-10-CM

## 2014-08-25 DIAGNOSIS — I6523 Occlusion and stenosis of bilateral carotid arteries: Secondary | ICD-10-CM

## 2014-08-25 DIAGNOSIS — I272 Pulmonary hypertension, unspecified: Secondary | ICD-10-CM

## 2014-08-25 DIAGNOSIS — I2510 Atherosclerotic heart disease of native coronary artery without angina pectoris: Secondary | ICD-10-CM

## 2014-08-25 DIAGNOSIS — E785 Hyperlipidemia, unspecified: Secondary | ICD-10-CM

## 2014-08-25 MED ORDER — POTASSIUM CHLORIDE CRYS ER 20 MEQ PO TBCR: 60.0000 meq | EXTENDED_RELEASE_TABLET | Freq: Every day | ORAL | Status: DC

## 2014-08-25 MED ORDER — TORSEMIDE 20 MG PO TABS: 60.0000 mg | ORAL_TABLET | Freq: Two times a day (BID) | ORAL | Status: DC

## 2014-08-25 NOTE — Progress Notes (Signed)
Cardiology Office Note   Date:  08/25/2014   ID:  Kimberly [name], DOB January 03, 1956, MRN [id number]  PCP:  Ginette OttoSTONEKING,[name] THOMAS, MD  Cardiologist:  Dr. Olga MillersBrian Crenshaw     History of Present Illness: Kimberly [name] is a 58 y.o. female with a history of morbid obesity, diastolic HF, diabetes, CAD, CKD stage III and chronic respiratory failure. She was admitted 9/29-10/25 with anasarca in the setting of a/c diastolic CHF and a/c renal failure. She was off of her diuretics for some time. Prior to admission, she had gained 50 lbs. She had no improvement with outpatient initiation of her diuretics. She was followed by the [facility] team. She was diuresed with Lasix drip and milrinone. 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 She developed worsening renal function with cardiorenal syndrome. She was evaluated by nephrology and required dialysis ([facility]). She required brief intubation for worsening hypercarbic respiratory failure. She was eventually transitioned off of dialysis and back on to oral diuretics. She was down 70+ lbs at DC. Of note, she had markedly elevated TSH upon admission. Synthroid was initiated. [facility].   I saw her last month and she remained stable.  She is now back at home.  She is here with her husband.  She notes gradually increasing LE edema.  She is up 20 lbs since DC.  Denies significant worsening breathing.  Denies syncope.  She has some chest discomfort with over-exertion.  This has been a stable symptom for years.  She sleeps in a recliner.  She denies PND.  She is limiting her salt and fluid intake.    Studies: - LHC (8/11): EF 70%, proximal LAD 30% (diabetic appearance), distal LAD 70%, proximal OM1 70-80% (very small), OM2 50% - Echo (9/15): EF 55-60%, normal wall motion, grade 2 diastolic dysfunction, moderate mitral stenosis  (mean 7 mmHg), mild LAE, normal RV function, mild TR, PASP 77 mmHg (severe pulmonary hypertension) - Carotid US (3/15): RICA 40-59%, left CEA patent (velocities suggest 40-59%) - MUGA (5/13): Good quality study. EF 62% - RHC (10/15): RA 2, PA 90/33 (53), PCWP 31, Thermo CO/CI 5.6/2.4, PVR 4.0 WU, SVR 792; Severe biventricular volume overload. 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    Wt Readings from Last 3 Encounters:  08/25/14 268 lb (121.564 kg)  08/05/14 259 lb 6.4 oz (117.663 kg)  08/03/14 255 lb (115.667 kg)     Past Medical History  Diagnosis Date  . HYPERLIPIDEMIA   . HYPERTENSION   . CAD, NATIVE VESSEL     May 10, 2010 cath showed a hyperdynamic LV function, she had dominant circumflex anatomy with a 70-80% small OM1. She had diffuse diabetic plaque particularly in the distal LAD. She nondominant RCA.  Nondominant  . PVD     CEA  . DM   . COPD   . Edema   . Stroke   . CHF (congestive heart failure)     Preserved EF  . Complication of anesthesia     DIFFICULT WAKING   . Cellulitis 10/15/2013    BILATERAL  . Chronic kidney disease   . Diabetic neuropathy   .  Anasarca 05/2014  . Heart attack     Current Outpatient Prescriptions  Medication Sig Dispense Refill  . acetaminophen (TYLENOL) 325 MG tablet Take 650 mg by mouth every 6 (six) hours as needed for mild pain.    Marland Kitchen albuterol (PROVENTIL HFA;VENTOLIN HFA) 108 (90 BASE) MCG/ACT inhaler Inhale 1-2 puffs into the lungs every 6 (six) hours as needed for wheezing or shortness of breath.    Marland Kitchen amLODipine (NORVASC) 5 MG tablet TAKE 1 TABLET BY MOUTH EVERY DAY 30 tablet 0  . aspirin EC 81 MG tablet Take 81  mg by mouth daily.    . insulin aspart (NOVOLOG) 100 UNIT/ML injection Inject 6 Units into the skin 3 (three) times daily with meals.    . insulin glargine (LANTUS) 100 UNIT/ML injection Inject 55 Units into the skin 2 (two) times daily.    Marland Kitchen KLOR-CON M20 20 MEQ tablet TAKE 2 TABLETS BY MOUTH EVERY DAY 60 tablet 0  . levothyroxine (SYNTHROID, LEVOTHROID) 75 MCG tablet Take 75 mcg by mouth daily before breakfast.    . metoprolol succinate (TOPROL-XL) 50 MG 24 hr tablet Take 1 and 1/2 tabs daily = 75 mg daily (Patient taking differently: 75 mg. Take 1 and 1/2 tabs daily = 75 mg daily) 45 tablet 11  . Multiple Vitamins-Minerals (MULTIVITAMIN PO) Take 1 tablet by mouth daily.    Marland Kitchen torsemide (DEMADEX) 20 MG tablet TAKE 2 TABLETS BY MOUTH TWICE A DAY 120 tablet 0   No current facility-administered medications for this visit.     Allergies:   Epinephrine   Social History:  The patient  reports that she quit smoking about 8 years ago. Her smoking use included Cigarettes. She has a 20 pack-year smoking history. She has never used smokeless tobacco. She reports that she does not drink alcohol or use illicit drugs.   Family History:  The patient's family history includes Asthma in her father; CAD (age of onset: 56) in her father; Cancer in her mother; Heart attack in her father; Hypertension in her father; Peripheral vascular disease in her father; Stroke in her mother.    ROS:  Please see the history of present illness.       All other systems reviewed and negative.    PHYSICAL EXAM: VS:  BP 134/60 mmHg  Pulse 82  Ht 5\' 4"  (1.626 m)  Wt 268 lb (121.564 kg)  BMI 45.98 kg/m2  SpO2 91% Well nourished, well developed, in no acute distress HEENT: normal Neck:  I cannot appreciate JVD at 90 degrees Cardiac:  normal S1, S2;  RRR;  no murmur   Lungs:  Decreased breath sounds bilaterally, no wheezing, rhonchi or rales Abd: soft, nontender, no hepatomegaly Ext:  2+ tight bilateral LE edemato her  thighs Skin: warm and dry Neuro:  CNs 2-12 intact, no focal abnormalities noted      ASSESSMENT AND PLAN:   1.  Chronic Diastolic CHF:  She has gained 16+ lbs since I saw her.  Her lungs are clear and her belly is soft.  She notes good UOP.  I have previously reviewed her case with Dr. Olga Millers.  We plan to have her followed in the HF Clinic as well.      -  Increase Torsemide to 60 mg BID.    -  Increase K+ to 60 mEq QD.    -  BMET today.  Repeat in 1 week.    -  Weigh daily and report those weights to me  in 1 week.    -  Refer to [facility].  She saw Dr. Arvilla Meresaniel Bensimhon in the hospital.   2.  Coronary Artery Disease:  She has occasional chest pain with over-exertion.  This is chronic for years without change.      -  Continue aspirin. She is intolerant to statins. 3.  Morbid Obesity:   She likely has an element of obesity hypoventilation syndrome. 4. Hypothyroidism: Follow-up with primary care. 5. Type 2 diabetes mellitus:  FU with PCP.  6. Obesity Hypoventilation Syndrome:  She has been referred to Dr. Shelle Ironlance for probable obesity hypoventilation syndrome. Evaluation is pending. 7. CKD (chronic kidney disease), stage III: follow renal function and K+ closely with adjustments in her diuretics.   8. Pulmonary HTN: As noted, evaluation with Pulmonology is ongoing. 9. Carotid stenosis, bilateral:  Follow-up with vascular surgery as planned. 10. Hyperlipidemia:  She is intolerant to statins. 11. Mitral valve stenosis, moderate:  This will be followed with an echocardiogram in 6-12 months. 12. HTN:  Controlled.   Disposition:   FU with Dr. Olga MillersBrian Crenshaw 09/18/14 as planned and refer to Dr. Arvilla Meresaniel Bensimhon in the HF Clinic as noted above.    Signed, Brynda RimScott Madisan Bice, PA-C, MHS 08/25/2014 4:21 PM    [facility] [address], [place], [place]  [address] Phone: [phone]; Fax: [phone]) [phone]

## 2014-08-25 NOTE — Patient Instructions (Signed)
INCREASE TORSEMIDE TO 60 MG TWICE DAILY INCREASE POTASSIUM 60 MEQ DAILY  LAB WORK BMET  REPEAT BMET IN 1 WEEK  You have been referred to HEART FAILURE TO BE SEEN IN THE NEXT WEEK  IF POSSIBLE TO SEE DR. Gala RomneyBENSIMHON WHO YOU SAW IN THE HOSPITAL  KEEP YOUR APPT WITH DR. CRENSHAW 09/18/14  MONITOR YOUR WEIGHT'S AND BRING A LIST TO YOUR APPT WITH DR. Gala RomneyBENSIMHON OR DR. CRENSHAW

## 2014-08-26 LAB — BASIC METABOLIC PANEL
BUN: 50 mg/dL — ABNORMAL HIGH (ref 6–23)
CALCIUM: 9.4 mg/dL (ref 8.4–10.5)
CHLORIDE: 100 meq/L (ref 96–112)
CO2: 29 meq/L (ref 19–32)
Creatinine, Ser: 2 mg/dL — ABNORMAL HIGH (ref 0.4–1.2)
GFR: 27.36 mL/min — ABNORMAL LOW (ref >60.00)
Glucose, Bld: 140 mg/dL — ABNORMAL HIGH (ref 70–99)
Potassium: 4.9 mEq/L (ref 3.5–5.1)
SODIUM: 136 meq/L (ref 135–145)

## 2014-08-27 ENCOUNTER — Telehealth: Payer: Self-pay | Admitting: *Deleted

## 2014-08-27 NOTE — Telephone Encounter (Signed)
Pt notified about lab results with verbal understanding 

## 2014-09-01 ENCOUNTER — Ambulatory Visit (HOSPITAL_COMMUNITY)
Admission: RE | Admit: 2014-09-01 | Discharge: 2014-09-01 | Disposition: A | Discharge status: Home / Self Care | Payer: Managed Care, Other (non HMO) | Source: Ambulatory Visit | Attending: Internal Medicine | Admitting: Internal Medicine

## 2014-09-01 VITALS — BP 118/56 | HR 78 | Wt 267.5 lb

## 2014-09-01 DIAGNOSIS — Z794 Long term (current) use of insulin: Secondary | ICD-10-CM | POA: Diagnosis not present

## 2014-09-01 DIAGNOSIS — Z87891 Personal history of nicotine dependence: Secondary | ICD-10-CM | POA: Diagnosis not present

## 2014-09-01 DIAGNOSIS — I050 Rheumatic mitral stenosis: Secondary | ICD-10-CM | POA: Insufficient documentation

## 2014-09-01 DIAGNOSIS — I129 Hypertensive chronic kidney disease with stage 1 through stage 4 chronic kidney disease, or unspecified chronic kidney disease: Secondary | ICD-10-CM | POA: Diagnosis not present

## 2014-09-01 DIAGNOSIS — E662 Morbid (severe) obesity with alveolar hypoventilation: Secondary | ICD-10-CM | POA: Diagnosis not present

## 2014-09-01 DIAGNOSIS — Z8673 Personal history of transient ischemic attack (TIA), and cerebral infarction without residual deficits: Secondary | ICD-10-CM | POA: Insufficient documentation

## 2014-09-01 DIAGNOSIS — I5032 Chronic diastolic (congestive) heart failure: Secondary | ICD-10-CM

## 2014-09-01 DIAGNOSIS — Z79899 Other long term (current) drug therapy: Secondary | ICD-10-CM | POA: Diagnosis not present

## 2014-09-01 DIAGNOSIS — N183 Chronic kidney disease, stage 3 unspecified: Secondary | ICD-10-CM

## 2014-09-01 DIAGNOSIS — E1140 Type 2 diabetes mellitus with diabetic neuropathy, unspecified: Secondary | ICD-10-CM | POA: Diagnosis not present

## 2014-09-01 DIAGNOSIS — I252 Old myocardial infarction: Secondary | ICD-10-CM | POA: Diagnosis not present

## 2014-09-01 DIAGNOSIS — I739 Peripheral vascular disease, unspecified: Secondary | ICD-10-CM | POA: Insufficient documentation

## 2014-09-01 DIAGNOSIS — J449 Chronic obstructive pulmonary disease, unspecified: Secondary | ICD-10-CM | POA: Diagnosis not present

## 2014-09-01 DIAGNOSIS — I2510 Atherosclerotic heart disease of native coronary artery without angina pectoris: Secondary | ICD-10-CM | POA: Diagnosis not present

## 2014-09-01 DIAGNOSIS — E785 Hyperlipidemia, unspecified: Secondary | ICD-10-CM | POA: Diagnosis not present

## 2014-09-01 DIAGNOSIS — Z7982 Long term (current) use of aspirin: Secondary | ICD-10-CM | POA: Diagnosis not present

## 2014-09-01 LAB — BASIC METABOLIC PANEL
Anion gap: 8 (ref 5–15)
BUN: 41 mg/dL — ABNORMAL HIGH (ref 6–23)
CO2: 32 mmol/L (ref 19–32)
Calcium: 9.2 mg/dL (ref 8.4–10.5)
Chloride: 97 mEq/L (ref 96–112)
Creatinine, Ser: 2.12 mg/dL — ABNORMAL HIGH (ref 0.50–1.10)
GFR calc Af Amer: 28 mL/min — ABNORMAL LOW (ref >90)
GFR calc non Af Amer: 24 mL/min — ABNORMAL LOW (ref >90)
GLUCOSE: 124 mg/dL — AB (ref 70–99)
POTASSIUM: 4.4 mmol/L (ref 3.5–5.1)
SODIUM: 137 mmol/L (ref 135–145)

## 2014-09-01 MED ORDER — POTASSIUM CHLORIDE CRYS ER 20 MEQ PO TBCR: 60.0000 meq | EXTENDED_RELEASE_TABLET | Freq: Every day | ORAL | Status: DC

## 2014-09-01 MED ORDER — METOLAZONE 5 MG PO TABS
5.0000 mg | ORAL_TABLET | ORAL | Status: DC
Start: 2014-09-01 — End: 2015-01-09

## 2014-09-01 NOTE — Addendum Note (Signed)
Encounter addended by: Noralee SpaceHeather M Imajean Mcdermid, RN on: 09/01/2014 12:22 PM<BR>     Documentation filed: Orders, Dx Association, Patient Instructions Section

## 2014-09-01 NOTE — Progress Notes (Signed)
Patient ID: Kimberly [name], female   DOB: 1956-07-28, 58 y.o.   MRN: [id number]    Cardiology Office Note   Date:  09/01/2014   ID:  Kimberly [name], DOB 01-Jun-1956, MRN [id number]  PCP:  [name], MD  Cardiologist:  Dr. Olga Millers     History of Present Illness: Kimberly [name] is a 58 y.o. female with a history of morbid obesity, diastolic HF, diabetes, CAD, CKD stage III and chronic respiratory failure. She was admitted 9/29-10/25 with anasarca in the setting of a/c diastolic CHF and a/c renal failure. She was off of her diuretics for some time. Prior to admission, she had gained 50 lbs. She had no improvement with outpatient initiation of her diuretics. She was followed by the Advanced HF team. She was diuresed with Lasix drip and milrinone. 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 She developed worsening renal function with cardiorenal syndrome. She was evaluated by nephrology and required dialysis ([facility]). She required brief intubation for worsening hypercarbic respiratory failure. She was eventually transitioned off of dialysis and back on to oral diuretics. She was down 70+ lbs at DC. Weight at d/c was 249. Of note, she had markedly elevated TSH upon admission. Synthroid was initiated. She was discharged to [facility].   She has been following with Tereso Newcomer. Was seen last week and had gradual weight gain. Was up 20 pounds since going home from [facility].   She is now back at home.  She is here with her husband.  She notes gradually increasing LE edema.  She is up 20 lbs since DC. Increase torsemide from 40 bid to 60 bid. Referred back to HF clinic for f/u.   Follow-up: Weight initially went down a few pounds but now back up. Was 268 last week. Now 267. Says had one day she cheated with her fluids but otherwise has been watching closely. Gets SOB with  walking across the room. Can lie almost flat in bed but sleeps mostly in recliner. Edema is getting worse.      Studies: - LHC (8/11): EF 70%, proximal LAD 30% (diabetic appearance), distal LAD 70%, proximal OM1 70-80% (very small), OM2 50% - Echo (9/15): EF 55-60%, normal wall motion, grade 2 diastolic dysfunction, moderate mitral stenosis (mean 7 mmHg), mild LAE, normal RV function, mild TR, PASP 77 mmHg (severe pulmonary hypertension) - Carotid US (3/15): RICA 40-59%, left CEA patent (velocities suggest 40-59%) - MUGA (5/13): Good quality study. EF 62% - RHC (10/15): RA 2, PA 90/33 (53), PCWP 31, Thermo CO/CI 5.6/2.4, PVR 4.0 WU, SVR 792; Severe biventricular volume overload. With severe mixed pulmonary HTN (mostly pulmonary venous HTN)   Recent Labs: 06/09/2014: Pro B Natriuretic peptide (BNP) 4358.0* 06/24/2014: ALT 7; TSH 9.120* 07/02/2014: Hemoglobin 10.0* 08/25/2014: BUN 50*; Creatinine 2.0*; Potassium 4.9; Sodium 136  07/13/14:  K+ 4, BUN 57, creatinine 1.96, Hgb 10.3 08/25/2014: K+ 4.9 creatinine 2.0  Recent Radiology: No results found.    Wt Readings from Last 3 Encounters:  09/01/14 267 lb 8 oz (121.337 kg)  08/25/14 268 lb (121.564 kg)  08/05/14 259 lb 6.4 oz (117.663 kg)     Past Medical History  Diagnosis Date  . HYPERLIPIDEMIA   . HYPERTENSION   . CAD, NATIVE VESSEL     May 10, 2010 cath showed a hyperdynamic LV function, she had dominant circumflex anatomy with a 70-80% small OM1. She had diffuse diabetic plaque particularly  in the distal LAD. She nondominant RCA.  Nondominant  . PVD     CEA  . DM   . COPD   . Edema   . Stroke   . CHF (congestive heart failure)     Preserved EF  . Complication of anesthesia     DIFFICULT WAKING   . Cellulitis 10/15/2013    BILATERAL  . Chronic kidney disease   . Diabetic neuropathy   . Anasarca 05/2014  . Heart attack     Current Outpatient Prescriptions  Medication Sig Dispense Refill  .  acetaminophen (TYLENOL) 325 MG tablet Take 650 mg by mouth every 6 (six) hours as needed for mild pain.    Marland Kitchen. albuterol (PROVENTIL HFA;VENTOLIN HFA) 108 (90 BASE) MCG/ACT inhaler Inhale 1-2 puffs into the lungs every 6 (six) hours as needed for wheezing or shortness of breath.    Marland Kitchen. amLODipine (NORVASC) 5 MG tablet TAKE 1 TABLET BY MOUTH EVERY DAY 30 tablet 0  . aspirin EC 81 MG tablet Take 81 mg by mouth daily.    . insulin aspart (NOVOLOG) 100 UNIT/ML injection Inject 6 Units into the skin 3 (three) times daily with meals.    . insulin glargine (LANTUS) 100 UNIT/ML injection Inject 55 Units into the skin 2 (two) times daily.    Marland Kitchen. levothyroxine (SYNTHROID, LEVOTHROID) 75 MCG tablet Take 75 mcg by mouth daily before breakfast.    . metoprolol succinate (TOPROL-XL) 50 MG 24 hr tablet Take 1 and 1/2 tabs daily = 75 mg daily (Patient taking differently: 75 mg. Take 1 and 1/2 tabs daily = 75 mg daily) 45 tablet 11  . Multiple Vitamins-Minerals (MULTIVITAMIN PO) Take 1 tablet by mouth daily.    . potassium chloride SA (KLOR-CON M20) 20 MEQ tablet Take 3 tablets (60 mEq total) by mouth daily. 90 tablet 3  . torsemide (DEMADEX) 20 MG tablet Take 3 tablets (60 mg total) by mouth 2 (two) times daily. 170 tablet 3   No current facility-administered medications for this encounter.     Allergies:   Epinephrine   Social History:  The patient  reports that she quit smoking about 8 years ago. Her smoking use included Cigarettes. She has a 20 pack-year smoking history. She has never used smokeless tobacco. She reports that she does not drink alcohol or use illicit drugs.   Family History:  The patient's family history includes Asthma in her father; CAD (age of onset: 9730) in her father; Cancer in her mother; Heart attack in her father; Hypertension in her father; Peripheral vascular disease in her father; Stroke in her mother.    ROS:  Please see the history of present illness.       All other systems reviewed  and negative.    PHYSICAL EXAM: VS:  BP 118/56 mmHg  Pulse 78  Wt 267 lb 8 oz (121.337 kg)  SpO2 93% Well nourished, well developed, in no acute distress HEENT: normal Neck:  Thick hard to see JVP. L CEA scar Cardiac:  normal S1, S2;  RRR;  no murmur   Lungs:  Decreased breath sounds bilaterally, no wheezing, rhonchi or rales Abd: obese soft, nontender, no hepatomegaly Ext:  2-3+ bilateral LE edemato her thighs Skin: warm and dry Neuro:  CNs 2-12 intact, no focal abnormalities noted      ASSESSMENT AND PLAN:   1.  Chronic Diastolic CHF:  With R>L symptoms. She has gained 20+ lbs since hospital d/c despite compliance with dietary restrictions. Weight  unchanged after recent increase in torsemide. Diuresis limited by CKD.     -  Will recheck BMET today. Add metolazone 5mg  on Monday and Fridays (ok to take one today). Take with Kcl 20.     -  She prefers not to use leg wraps at this time    -  Reinforced need for daily weights and reviewed use of sliding scale diuretics.    -  F/u BMET in 2 weeks 2.  Coronary Artery Disease:  She has occasional chest pain with over-exertion.  This is chronic for years without change.      -  Continue aspirin. She is intolerant to statins. 3.  Morbid Obesity:   She likely has an element of obesity hypoventilation syndrome. 4. Obesity Hypoventilation Syndrome:  She has been referred to Dr. Shelle Ironlance for probable obesity hypoventilation syndrome and sleep study. Evaluation is pending. 5. CKD (chronic kidney disease), stage III: follow renal function and K+ closely with adjustments in her diuretics.   6. Mitral valve stenosis, moderate:  This will be followed with an echocardiogram in 6-12 months.   Daniel Bensimhon,MD 12:05 PM

## 2014-09-01 NOTE — Patient Instructions (Signed)
Take Metolazone 5 mg every Monday and Friday, can start 1st dose today  Take an extra 20 meq of Potassium when you take Metolazone  Lab today  Lab in 2 weeks (bmet)  Your physician recommends that you schedule a follow-up appointment in: 4 weeks

## 2014-09-09 ENCOUNTER — Telehealth: Payer: Self-pay | Admitting: *Deleted

## 2014-09-09 NOTE — Telephone Encounter (Signed)
Pharmacy calling to have pt's Metoprolol switched to 90 days per Insurance.  #[id number], 3RF

## 2014-09-15 ENCOUNTER — Ambulatory Visit (HOSPITAL_COMMUNITY)
Admission: RE | Admit: 2014-09-15 | Discharge: 2014-09-15 | Disposition: A | Discharge status: Home / Self Care | Payer: Managed Care, Other (non HMO) | Source: Ambulatory Visit | Attending: Cardiology | Admitting: Cardiology

## 2014-09-15 DIAGNOSIS — I5022 Chronic systolic (congestive) heart failure: Secondary | ICD-10-CM

## 2014-09-15 DIAGNOSIS — I5032 Chronic diastolic (congestive) heart failure: Secondary | ICD-10-CM | POA: Insufficient documentation

## 2014-09-15 LAB — BASIC METABOLIC PANEL
Anion gap: 17 — ABNORMAL HIGH (ref 5–15)
BUN: 69 mg/dL — AB (ref 6–23)
CHLORIDE: 90 meq/L — AB (ref 96–112)
CO2: 29 mmol/L (ref 19–32)
CREATININE: 2.92 mg/dL — AB (ref 0.50–1.10)
Calcium: 9.4 mg/dL (ref 8.4–10.5)
GFR calc Af Amer: 19 mL/min — ABNORMAL LOW (ref >90)
GFR calc non Af Amer: 17 mL/min — ABNORMAL LOW (ref >90)
GLUCOSE: 168 mg/dL — AB (ref 70–99)
Potassium: 3.4 mmol/L — ABNORMAL LOW (ref 3.5–5.1)
Sodium: 136 mmol/L (ref 135–145)

## 2014-09-15 NOTE — Progress Notes (Signed)
Pt in for labs.

## 2014-09-18 ENCOUNTER — Ambulatory Visit (INDEPENDENT_AMBULATORY_CARE_PROVIDER_SITE_OTHER): Payer: Managed Care, Other (non HMO) | Admitting: Cardiology

## 2014-09-18 ENCOUNTER — Encounter: Payer: Self-pay | Admitting: Cardiology

## 2014-09-18 VITALS — BP 130/62 | HR 80 | Ht 64.0 in | Wt 262.5 lb

## 2014-09-18 DIAGNOSIS — I050 Rheumatic mitral stenosis: Secondary | ICD-10-CM

## 2014-09-18 DIAGNOSIS — I739 Peripheral vascular disease, unspecified: Secondary | ICD-10-CM

## 2014-09-18 DIAGNOSIS — I2510 Atherosclerotic heart disease of native coronary artery without angina pectoris: Secondary | ICD-10-CM

## 2014-09-18 DIAGNOSIS — I10 Essential (primary) hypertension: Secondary | ICD-10-CM

## 2014-09-18 DIAGNOSIS — I5032 Chronic diastolic (congestive) heart failure: Secondary | ICD-10-CM

## 2014-09-18 DIAGNOSIS — I6529 Occlusion and stenosis of unspecified carotid artery: Secondary | ICD-10-CM

## 2014-09-18 DIAGNOSIS — E662 Morbid (severe) obesity with alveolar hypoventilation: Secondary | ICD-10-CM

## 2014-09-18 MED ORDER — POTASSIUM CHLORIDE CRYS ER 20 MEQ PO TBCR: 40.0000 meq | EXTENDED_RELEASE_TABLET | Freq: Two times a day (BID) | ORAL | Status: DC

## 2014-09-18 NOTE — Assessment & Plan Note (Signed)
Continue aspirin. Declines follow-up studies and understands higher risk of stroke. She feels she would not be a candidate for intervention in the future even if significant stenosis identified.

## 2014-09-18 NOTE — Assessment & Plan Note (Signed)
Continue aspirin 

## 2014-09-18 NOTE — Assessment & Plan Note (Signed)
She is doing remarkably well. Continue present dose of Demadex. She is also taking metolazone twice weekly. I have asked her to take an additional 20 mg of Demadex daily for weight gain of 3 pounds. She is following a low sodium diet and fluid restriction. Recent potassium 3.4. Increase KCl to 40 mEq by mouth twice a day. Check potassium and renal function in 5 days. Also followed in CHF clinic.

## 2014-09-18 NOTE — Progress Notes (Signed)
HPI: FU morbid obesity, diastolic HF, diabetes, CAD, CKD stage III and chronic respiratory failure. She was admitted 9/15 with anasarca in the setting of a/c diastolic CHF and a/c renal failure. She was off of her diuretics for some time. Prior to admission, she had gained 50 lbs. She had no improvement with outpatient initiation of her diuretics. She was followed by the Advanced HF team. She was diuresed with Lasix drip and milrinone. 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 She developed worsening renal function with cardiorenal syndrome. She was evaluated by nephrology and required dialysis ([facility]). She required brief intubation for worsening hypercarbic respiratory failure. She was eventually transitioned off of dialysis and back on to oral diuretics. She was down 70+ lbs at DC. Of note, she had markedly elevated TSH upon admission. Synthroid was initiated. [facility] with increasing CHF symptoms and metolazone added. Since she was last seen she does have some dyspnea on exertion but unchanged. No orthopnea or PND. Chronic pedal edema which is actually improved. No chest pain or syncope. She weighs herself daily and is following a low-sodium diet.     Studies: - LHC (8/11): EF 70%, proximal LAD 30% (diabetic appearance), distal LAD 70%, proximal OM1 70-80% (very small), OM2 50% - Echo (9/15): EF 55-60%, normal wall motion, grade 2 diastolic dysfunction, moderate mitral stenosis (mean 7 mmHg), mild LAE, normal RV function, mild TR, PASP 77 mmHg (severe pulmonary hypertension) - Carotid US (3/15): RICA 40-59%, left CEA patent (velocities suggest 40-59%) - MUGA (5/13): Good quality study. EF 62% - RHC (10/15): RA 2, PA 90/33 (53), PCWP 31, Thermo CO/CI 5.6/2.4, PVR 4.0 WU, SVR 792; Severe biventricular volume overload. With severe mixed  pulmonary HTN (mostly pulmonary venous HTN)  Current Outpatient Prescriptions  Medication Sig Dispense Refill  . acetaminophen (TYLENOL) 325 MG tablet Take 650 mg by mouth every 6 (six) hours as needed for mild pain.    Marland Kitchen. albuterol (PROVENTIL HFA;VENTOLIN HFA) 108 (90 BASE) MCG/ACT inhaler Inhale 1-2 puffs into the lungs every 6 (six) hours as needed for wheezing or shortness of breath.    Marland Kitchen. amLODipine (NORVASC) 5 MG tablet TAKE 1 TABLET BY MOUTH EVERY DAY 30 tablet 0  . aspirin EC 81 MG tablet Take 81 mg by mouth daily.    . insulin aspart (NOVOLOG) 100 UNIT/ML injection Inject 6 Units into the skin 3 (three) times daily with meals.    . insulin glargine (LANTUS) 100 UNIT/ML injection Inject 55 Units into the skin 2 (two) times daily.    Marland Kitchen. levothyroxine (SYNTHROID, LEVOTHROID) 75 MCG tablet Take 75 mcg by mouth daily before breakfast.    . metolazone (ZAROXOLYN) 5 MG tablet Take 1 tablet (5 mg total) by mouth 2 (two) times a week. Every Mon and Fri 10 tablet 3  . metoprolol succinate (TOPROL-XL) 50 MG 24 hr tablet Take 1 and 1/2 tabs daily = 75 mg daily (Patient taking differently: 75 mg. Take 1 and 1/2 tabs daily = 75 mg daily) 45 tablet 11  . Multiple Vitamins-Minerals (MULTIVITAMIN PO) Take 1 tablet by mouth daily.    . potassium chloride SA (KLOR-CON M20) 20 MEQ tablet Take 3 tablets (60 mEq total) by mouth daily. Take an extra tab when you take Metolazone 90 tablet 3  . torsemide (DEMADEX) 20 MG tablet Take 3 tablets (60 mg total) by mouth 2 (two) times daily. 170 tablet 3  No current facility-administered medications for this visit.     Past Medical History  Diagnosis Date  . HYPERLIPIDEMIA   . HYPERTENSION   . CAD, NATIVE VESSEL     May 10, 2010 cath showed a hyperdynamic LV function, she had dominant circumflex anatomy with a 70-80% small OM1. She had diffuse diabetic plaque particularly in the distal LAD. She nondominant RCA.  Nondominant  . PVD     CEA  . DM   . COPD   .  Edema   . Stroke   . CHF (congestive heart failure)     Preserved EF  . Complication of anesthesia     DIFFICULT WAKING   . Cellulitis 10/15/2013    BILATERAL  . Chronic kidney disease   . Diabetic neuropathy   . Anasarca 05/2014  . Heart attack     Past Surgical History  Procedure Laterality Date  . Eye surgery    . Carotid endarterectomy      History   Social History  . Marital Status: Divorced    Spouse Name: N/A    Number of Children: 0  . Years of Education: N/A   Occupational History  . disabled    Social History Main Topics  . Smoking status: Former Smoker -- 1.00 packs/day for 20 years    Types: Cigarettes    Quit date: 09/11/2005  . Smokeless tobacco: Never Used  . Alcohol Use: No  . Drug Use: No  . Sexual Activity: Not on file   Other Topics Concern  . Not on file   Social History Narrative    ROS: no fevers or chills, productive cough, hemoptysis, dysphasia, odynophagia, melena, hematochezia, dysuria, hematuria, rash, seizure activity, orthopnea, PND, pedal edema, claudication. Remaining systems are negative.  Physical Exam: Well-developed obese in no acute distress.  Skin is warm and dry.  HEENT is normal.  Neck is supple.  Chest is clear to auscultation with normal expansion.  Cardiovascular exam is regular rate and rhythm.  Abdominal exam nontender or distended. No masses palpated. Extremities show 1+ edema with chronic skin changes. neuro grossly intact

## 2014-09-18 NOTE — Assessment & Plan Note (Signed)
Repeat echocardiogram September 2016.

## 2014-09-18 NOTE — Patient Instructions (Signed)
Your physician wants you to follow-up in: 6 MONTHS WITH DR Jens SomRENSHAW You will receive a reminder letter in the mail two months in advance. If you don't receive a letter, please call our office to schedule the follow-up appointment.   INCREASE POTASSIUM TO 40 MEQ TWICE DAILY= 2 OF THE 20 MEQ TABLETS TWICE DAILY  TAKE AN EXTRA 20 MG OF TORSEMIDE AS NEEDED FOR INCREASED WEIGHT OF 3 LBS IN ONE DAY  Your physician recommends that you return for lab work Wednesday NEXT WEEK

## 2014-09-18 NOTE — Assessment & Plan Note (Signed)
Continue present medications. 

## 2014-09-18 NOTE — Assessment & Plan Note (Signed)
Continue aspirin. Declined statins previously.

## 2014-09-18 NOTE — Assessment & Plan Note (Signed)
Follow-up Dr. Shelle Ironlance for further evaluation.

## 2014-09-24 ENCOUNTER — Ambulatory Visit (INDEPENDENT_AMBULATORY_CARE_PROVIDER_SITE_OTHER): Payer: Managed Care, Other (non HMO) | Admitting: Pulmonary Disease

## 2014-09-24 ENCOUNTER — Telehealth: Payer: Self-pay | Admitting: Pulmonary Disease

## 2014-09-24 NOTE — Telephone Encounter (Signed)
PFT shows restriction, meaning that her lungs don't expand to hold as much air as normal. Also her Diffusion Capacity is low, meaning her lungs don't get oxygen out of the air as quickly as normal. These changes can come from being heavy and from having a history of fluid overload/ heart failure. She did not get much better with a trial of bronchodilator medicine breathed in during the PF test, so this is not an asthma/ bronchtis.Problem.

## 2014-09-24 NOTE — Telephone Encounter (Signed)
Spoke with pt-states that She received a call 09/16/14 to resche[facility] as he was not in office. States that she was offered to reschedule but could not at that time, stated she would call back. Per pt chart, [facility]. Pt states that she received a phone call 09/23/14 to [facility] ([facility]) and patient states that she told the lady she spoke with that she had already cancelled the appt with [facility] bc he was not going to be in office, patient then stated that she was informed that Dr Shelle Ironlance was going to see her at 11:15 on 09/24/14 as he was actually in office. (Patient states that she thinks she spoke with a 'Wendy' - I advised the patient the we do not have anyone named Toniann FailWendy in our office.) Pt was arrived at 11:19 and was informed after a while of sitting in waiting room that Dr Shelle Ironlance was in deed out of office and she needed to be rescheduled. Pt states that she was frustrated and left, was not offered an appt today with another physician. (Tammy Parrett has an opening at 11:45) I offered another appt with TP today to review these results and she refused stating that she did not want to come back in the office when she was already here earlier. States that her husband has already missed an hour of work today for nothing. Pt apologized for being so frustrated with me on the phone but states that she does not understand why this happened and wants her results today. I advised her again that Dr Shelle Ironlance is out of office until next week-had to cancel office at last minute- Pt expressed understanding. Pt is asking that another physician review these results (PFT) and call her today to discuss.   Please advise Dr Maple HudsonYoung if you are able to review the patient's PFT results today in Dr Teddy Spikelance's absence -- pt is requesting to speak with a physician today. Thanks.

## 2014-09-24 NOTE — Telephone Encounter (Signed)
Patient wanting to know if [facility] can call her over the phone to discuss PFT results instead of coming back in.

## 2014-09-24 NOTE — Telephone Encounter (Signed)
Spoke with pt, relayed results.  She understands.  Nothing further needed at this time.

## 2014-09-29 ENCOUNTER — Encounter (HOSPITAL_COMMUNITY): Payer: Managed Care, Other (non HMO)

## 2014-10-21 ENCOUNTER — Encounter (HOSPITAL_BASED_OUTPATIENT_CLINIC_OR_DEPARTMENT_OTHER): Payer: Managed Care, Other (non HMO)

## 2014-11-02 ENCOUNTER — Encounter: Payer: Self-pay | Admitting: Podiatry

## 2014-11-02 ENCOUNTER — Ambulatory Visit (INDEPENDENT_AMBULATORY_CARE_PROVIDER_SITE_OTHER): Payer: Managed Care, Other (non HMO) | Admitting: Podiatry

## 2014-11-02 DIAGNOSIS — B351 Tinea unguium: Secondary | ICD-10-CM

## 2014-11-02 DIAGNOSIS — Z8679 Personal history of other diseases of the circulatory system: Secondary | ICD-10-CM

## 2014-11-02 DIAGNOSIS — E0842 Diabetes mellitus due to underlying condition with diabetic polyneuropathy: Secondary | ICD-10-CM

## 2014-11-02 NOTE — Patient Instructions (Signed)
Diabetes and Foot Care Diabetes may cause you to have problems because of poor blood supply (circulation) to your feet and legs. This may cause the skin on your feet to become thinner, break easier, and heal more slowly. Your skin may become dry, and the skin may peel and crack. You may also have nerve damage in your legs and feet causing decreased feeling in them. You may not notice minor injuries to your feet that could lead to infections or more serious problems. Taking care of your feet is one of the most important things you can do for yourself.  HOME CARE INSTRUCTIONS  Wear shoes at all times, even in the house. Do not go barefoot. Bare feet are easily injured.  Check your feet daily for blisters, cuts, and redness. If you cannot see the bottom of your feet, use a mirror or ask someone for help.  Wash your feet with warm water (do not use hot water) and mild soap. Then pat your feet and the areas between your toes until they are completely dry. Do not soak your feet as this can dry your skin.  Apply a moisturizing lotion or petroleum jelly (that does not contain alcohol and is unscented) to the skin on your feet and to dry, brittle toenails. Do not apply lotion between your toes.  Trim your toenails straight across. Do not dig under them or around the cuticle. File the edges of your nails with an emery board or nail file.  Do not cut corns or calluses or try to remove them with medicine.  Wear clean socks or stockings every day. Make sure they are not too tight. Do not wear knee-high stockings since they may decrease blood flow to your legs.  Wear shoes that fit properly and have enough cushioning. To break in new shoes, wear them for just a few hours a day. This prevents you from injuring your feet. Always look in your shoes before you put them on to be sure there are no objects inside.  Do not cross your legs. This may decrease the blood flow to your feet.  If you find a minor scrape,  cut, or break in the skin on your feet, keep it and the skin around it clean and dry. These areas may be cleansed with mild soap and water. Do not cleanse the area with peroxide, alcohol, or iodine.  When you remove an adhesive bandage, be sure not to damage the skin around it.  If you have a wound, look at it several times a day to make sure it is healing.  Do not use heating pads or hot water bottles. They may burn your skin. If you have lost feeling in your feet or legs, you may not know it is happening until it is too late.  Make sure your health care provider performs a complete foot exam at least annually or more often if you have foot problems. Report any cuts, sores, or bruises to your health care provider immediately. SEEK MEDICAL CARE IF:   You have an injury that is not healing.  You have cuts or breaks in the skin.  You have an ingrown nail.  You notice redness on your legs or feet.  You feel burning or tingling in your legs or feet.  You have pain or cramps in your legs and feet.  Your legs or feet are numb.  Your feet always feel cold. SEEK IMMEDIATE MEDICAL CARE IF:   There is increasing redness,   swelling, or pain in or around a wound.  There is a red line that goes up your leg.  Pus is coming from a wound.  You develop a fever or as directed by your health care provider.  You notice a bad smell coming from an ulcer or wound. Document Released: 08/25/2000 Document Revised: 04/30/2013 Document Reviewed: 02/04/2013 ExitCare Patient Information 2015 [employer]. This information is not intended to replace advice given to you by your health care provider. Make sure you discuss any questions you have with your health care provider.  

## 2014-11-03 NOTE — Progress Notes (Signed)
Patient ID: Kimberly [name], female   DOB: 13-Jul-1956, 59 y.o.   MRN: [id number]  Subjective: This patient presents today requesting debridement of mycotic toenails  Objective: Patient stressors from wheelchair to treatment chair with assistance of husband The toenails are elongated, brittle, incurvated, hypertrophic 6-10  Assessment: Mycotic toenails 6-10 History of diabetic peripheral neuropathy History of peripheral vascular disease  Plan: Debridement toenails 10 without any bleeding  Reappoint 3 months

## 2014-12-28 ENCOUNTER — Other Ambulatory Visit: Payer: Self-pay | Admitting: Physician Assistant

## 2014-12-29 ENCOUNTER — Other Ambulatory Visit: Payer: Self-pay

## 2014-12-29 MED ORDER — TORSEMIDE 20 MG PO TABS: ORAL_TABLET | ORAL | Status: DC

## 2015-01-09 ENCOUNTER — Other Ambulatory Visit (HOSPITAL_COMMUNITY): Payer: Self-pay | Admitting: Internal Medicine

## 2015-01-11 ENCOUNTER — Other Ambulatory Visit (HOSPITAL_COMMUNITY): Payer: Self-pay | Admitting: *Deleted

## 2015-01-11 MED ORDER — METOLAZONE 5 MG PO TABS: ORAL_TABLET | ORAL | Status: DC

## 2015-01-12 ENCOUNTER — Other Ambulatory Visit (HOSPITAL_COMMUNITY): Payer: Self-pay | Admitting: *Deleted

## 2015-01-12 MED ORDER — METOLAZONE 5 MG PO TABS: ORAL_TABLET | ORAL | Status: DC

## 2015-02-01 ENCOUNTER — Ambulatory Visit: Payer: Managed Care, Other (non HMO) | Admitting: Podiatry

## 2015-04-06 ENCOUNTER — Emergency Department (HOSPITAL_COMMUNITY): Payer: Managed Care, Other (non HMO)

## 2015-04-06 ENCOUNTER — Encounter (HOSPITAL_COMMUNITY): Payer: Self-pay | Admitting: General Practice

## 2015-04-06 ENCOUNTER — Inpatient Hospital Stay (HOSPITAL_COMMUNITY)
Admission: EM | Admit: 2015-04-06 | Discharge: 2015-04-20 | DRG: 871 | Disposition: A | Discharge status: Home Health | Payer: Managed Care, Other (non HMO) | Attending: Internal Medicine | Admitting: Internal Medicine

## 2015-04-06 ENCOUNTER — Encounter: Payer: Self-pay | Admitting: Surgery

## 2015-04-06 DIAGNOSIS — N189 Chronic kidney disease, unspecified: Secondary | ICD-10-CM

## 2015-04-06 DIAGNOSIS — E11628 Type 2 diabetes mellitus with other skin complications: Secondary | ICD-10-CM | POA: Diagnosis present

## 2015-04-06 DIAGNOSIS — I5033 Acute on chronic diastolic (congestive) heart failure: Secondary | ICD-10-CM | POA: Diagnosis present

## 2015-04-06 DIAGNOSIS — J449 Chronic obstructive pulmonary disease, unspecified: Secondary | ICD-10-CM | POA: Diagnosis present

## 2015-04-06 DIAGNOSIS — L97523 Non-pressure chronic ulcer of other part of left foot with necrosis of muscle: Secondary | ICD-10-CM | POA: Insufficient documentation

## 2015-04-06 DIAGNOSIS — E1169 Type 2 diabetes mellitus with other specified complication: Secondary | ICD-10-CM | POA: Diagnosis not present

## 2015-04-06 DIAGNOSIS — E1140 Type 2 diabetes mellitus with diabetic neuropathy, unspecified: Secondary | ICD-10-CM | POA: Diagnosis present

## 2015-04-06 DIAGNOSIS — L97429 Non-pressure chronic ulcer of left heel and midfoot with unspecified severity: Secondary | ICD-10-CM | POA: Diagnosis not present

## 2015-04-06 DIAGNOSIS — L8990 Pressure ulcer of unspecified site, unspecified stage: Secondary | ICD-10-CM | POA: Diagnosis present

## 2015-04-06 DIAGNOSIS — Z9981 Dependence on supplemental oxygen: Secondary | ICD-10-CM | POA: Diagnosis not present

## 2015-04-06 DIAGNOSIS — L89619 Pressure ulcer of right heel, unspecified stage: Secondary | ICD-10-CM | POA: Diagnosis present

## 2015-04-06 DIAGNOSIS — R7881 Bacteremia: Secondary | ICD-10-CM | POA: Insufficient documentation

## 2015-04-06 DIAGNOSIS — R1310 Dysphagia, unspecified: Secondary | ICD-10-CM | POA: Diagnosis present

## 2015-04-06 DIAGNOSIS — R6520 Severe sepsis without septic shock: Secondary | ICD-10-CM | POA: Diagnosis present

## 2015-04-06 DIAGNOSIS — I10 Essential (primary) hypertension: Secondary | ICD-10-CM | POA: Diagnosis not present

## 2015-04-06 DIAGNOSIS — Z87891 Personal history of nicotine dependence: Secondary | ICD-10-CM

## 2015-04-06 DIAGNOSIS — I69391 Dysphagia following cerebral infarction: Secondary | ICD-10-CM | POA: Diagnosis not present

## 2015-04-06 DIAGNOSIS — Z66 Do not resuscitate: Secondary | ICD-10-CM | POA: Diagnosis present

## 2015-04-06 DIAGNOSIS — N179 Acute kidney failure, unspecified: Secondary | ICD-10-CM | POA: Insufficient documentation

## 2015-04-06 DIAGNOSIS — I959 Hypotension, unspecified: Secondary | ICD-10-CM | POA: Diagnosis present

## 2015-04-06 DIAGNOSIS — J9610 Chronic respiratory failure, unspecified whether with hypoxia or hypercapnia: Secondary | ICD-10-CM | POA: Diagnosis present

## 2015-04-06 DIAGNOSIS — E1122 Type 2 diabetes mellitus with diabetic chronic kidney disease: Secondary | ICD-10-CM | POA: Diagnosis present

## 2015-04-06 DIAGNOSIS — Z6841 Body Mass Index (BMI) 40.0 and over, adult: Secondary | ICD-10-CM | POA: Diagnosis not present

## 2015-04-06 DIAGNOSIS — E11621 Type 2 diabetes mellitus with foot ulcer: Secondary | ICD-10-CM | POA: Diagnosis present

## 2015-04-06 DIAGNOSIS — L089 Local infection of the skin and subcutaneous tissue, unspecified: Secondary | ICD-10-CM

## 2015-04-06 DIAGNOSIS — B965 Pseudomonas (aeruginosa) (mallei) (pseudomallei) as the cause of diseases classified elsewhere: Secondary | ICD-10-CM | POA: Diagnosis not present

## 2015-04-06 DIAGNOSIS — N390 Urinary tract infection, site not specified: Secondary | ICD-10-CM | POA: Diagnosis present

## 2015-04-06 DIAGNOSIS — E1165 Type 2 diabetes mellitus with hyperglycemia: Secondary | ICD-10-CM | POA: Diagnosis present

## 2015-04-06 DIAGNOSIS — Z794 Long term (current) use of insulin: Secondary | ICD-10-CM

## 2015-04-06 DIAGNOSIS — L03119 Cellulitis of unspecified part of limb: Secondary | ICD-10-CM | POA: Diagnosis not present

## 2015-04-06 DIAGNOSIS — I119 Hypertensive heart disease without heart failure: Secondary | ICD-10-CM | POA: Diagnosis present

## 2015-04-06 DIAGNOSIS — E876 Hypokalemia: Secondary | ICD-10-CM | POA: Diagnosis not present

## 2015-04-06 DIAGNOSIS — E1129 Type 2 diabetes mellitus with other diabetic kidney complication: Secondary | ICD-10-CM | POA: Diagnosis not present

## 2015-04-06 DIAGNOSIS — L03115 Cellulitis of right lower limb: Secondary | ICD-10-CM | POA: Diagnosis present

## 2015-04-06 DIAGNOSIS — L03116 Cellulitis of left lower limb: Secondary | ICD-10-CM | POA: Diagnosis present

## 2015-04-06 DIAGNOSIS — N183 Chronic kidney disease, stage 3 unspecified: Secondary | ICD-10-CM | POA: Diagnosis present

## 2015-04-06 DIAGNOSIS — I129 Hypertensive chronic kidney disease with stage 1 through stage 4 chronic kidney disease, or unspecified chronic kidney disease: Secondary | ICD-10-CM | POA: Diagnosis present

## 2015-04-06 DIAGNOSIS — E11359 Type 2 diabetes mellitus with proliferative diabetic retinopathy without macular edema: Secondary | ICD-10-CM | POA: Diagnosis present

## 2015-04-06 DIAGNOSIS — E08621 Diabetes mellitus due to underlying condition with foot ulcer: Secondary | ICD-10-CM | POA: Diagnosis not present

## 2015-04-06 DIAGNOSIS — I2510 Atherosclerotic heart disease of native coronary artery without angina pectoris: Secondary | ICD-10-CM | POA: Diagnosis present

## 2015-04-06 DIAGNOSIS — E871 Hypo-osmolality and hyponatremia: Secondary | ICD-10-CM | POA: Diagnosis present

## 2015-04-06 DIAGNOSIS — A4152 Sepsis due to Pseudomonas: Principal | ICD-10-CM | POA: Diagnosis present

## 2015-04-06 DIAGNOSIS — IMO0001 Reserved for inherently not codable concepts without codable children: Secondary | ICD-10-CM | POA: Insufficient documentation

## 2015-04-06 DIAGNOSIS — E039 Hypothyroidism, unspecified: Secondary | ICD-10-CM | POA: Diagnosis present

## 2015-04-06 DIAGNOSIS — Z79899 Other long term (current) drug therapy: Secondary | ICD-10-CM

## 2015-04-06 DIAGNOSIS — R0602 Shortness of breath: Secondary | ICD-10-CM | POA: Diagnosis present

## 2015-04-06 DIAGNOSIS — I252 Old myocardial infarction: Secondary | ICD-10-CM | POA: Diagnosis not present

## 2015-04-06 DIAGNOSIS — L97419 Non-pressure chronic ulcer of right heel and midfoot with unspecified severity: Secondary | ICD-10-CM | POA: Diagnosis not present

## 2015-04-06 DIAGNOSIS — E785 Hyperlipidemia, unspecified: Secondary | ICD-10-CM | POA: Diagnosis present

## 2015-04-06 DIAGNOSIS — I739 Peripheral vascular disease, unspecified: Secondary | ICD-10-CM | POA: Diagnosis present

## 2015-04-06 DIAGNOSIS — E46 Unspecified protein-calorie malnutrition: Secondary | ICD-10-CM | POA: Diagnosis present

## 2015-04-06 DIAGNOSIS — A419 Sepsis, unspecified organism: Secondary | ICD-10-CM | POA: Diagnosis not present

## 2015-04-06 DIAGNOSIS — Z7982 Long term (current) use of aspirin: Secondary | ICD-10-CM | POA: Diagnosis not present

## 2015-04-06 DIAGNOSIS — Z9119 Patient's noncompliance with other medical treatment and regimen: Secondary | ICD-10-CM | POA: Diagnosis present

## 2015-04-06 DIAGNOSIS — E1121 Type 2 diabetes mellitus with diabetic nephropathy: Secondary | ICD-10-CM | POA: Diagnosis not present

## 2015-04-06 DIAGNOSIS — I272 Other secondary pulmonary hypertension: Secondary | ICD-10-CM | POA: Diagnosis present

## 2015-04-06 DIAGNOSIS — I509 Heart failure, unspecified: Secondary | ICD-10-CM | POA: Diagnosis present

## 2015-04-06 DIAGNOSIS — N170 Acute kidney failure with tubular necrosis: Secondary | ICD-10-CM | POA: Diagnosis present

## 2015-04-06 DIAGNOSIS — D649 Anemia, unspecified: Secondary | ICD-10-CM | POA: Diagnosis present

## 2015-04-06 DIAGNOSIS — L97509 Non-pressure chronic ulcer of other part of unspecified foot with unspecified severity: Secondary | ICD-10-CM

## 2015-04-06 DIAGNOSIS — E11649 Type 2 diabetes mellitus with hypoglycemia without coma: Secondary | ICD-10-CM | POA: Diagnosis not present

## 2015-04-06 HISTORY — DX: Anxiety disorder, unspecified: F41.9

## 2015-04-06 HISTORY — DX: Gastro-esophageal reflux disease without esophagitis: K21.9

## 2015-04-06 HISTORY — DX: Unspecified asthma, uncomplicated: J45.909

## 2015-04-06 HISTORY — DX: Pneumonia, unspecified organism: J18.9

## 2015-04-06 HISTORY — DX: Other non-diabetic proliferative retinopathy, unspecified eye: H35.20

## 2015-04-06 HISTORY — DX: Unspecified chronic bronchitis: J42

## 2015-04-06 HISTORY — DX: Dependence on supplemental oxygen: Z99.81

## 2015-04-06 HISTORY — DX: Paralysis of vocal cords and larynx, unspecified: J38.00

## 2015-04-06 HISTORY — DX: Unspecified osteoarthritis, unspecified site: M19.90

## 2015-04-06 HISTORY — DX: Depression, unspecified: F32.A

## 2015-04-06 HISTORY — DX: Hypothyroidism, unspecified: E03.9

## 2015-04-06 HISTORY — DX: Type 2 diabetes mellitus without complications: E11.9

## 2015-04-06 HISTORY — DX: Polyneuropathy, unspecified: G62.9

## 2015-04-06 HISTORY — DX: Major depressive disorder, single episode, unspecified: F32.9

## 2015-04-06 HISTORY — DX: Personal history of other diseases of the digestive system: Z87.19

## 2015-04-06 LAB — LACTIC ACID, PLASMA: Lactic Acid, Venous: 2.2 mmol/L (ref 0.5–2.0)

## 2015-04-06 LAB — CBC WITH DIFFERENTIAL/PLATELET
Basophils Absolute: 0 10*3/uL (ref 0.0–0.1)
Basophils Relative: 0 % (ref 0–1)
EOS PCT: 0 % (ref 0–5)
Eosinophils Absolute: 0 10*3/uL (ref 0.0–0.7)
HCT: 31 % — ABNORMAL LOW (ref 36.0–46.0)
HEMOGLOBIN: 10.1 g/dL — AB (ref 12.0–15.0)
Lymphocytes Relative: 1 % — ABNORMAL LOW (ref 12–46)
Lymphs Abs: 0.4 10*3/uL — ABNORMAL LOW (ref 0.7–4.0)
MCH: 29.8 pg (ref 26.0–34.0)
MCHC: 32.6 g/dL (ref 30.0–36.0)
MCV: 91.4 fL (ref 78.0–100.0)
Monocytes Absolute: 0.9 10*3/uL (ref 0.1–1.0)
Monocytes Relative: 2 % — ABNORMAL LOW (ref 3–12)
NEUTROS ABS: 41.3 10*3/uL — AB (ref 1.7–7.7)
Neutrophils Relative %: 97 % — ABNORMAL HIGH (ref 43–77)
Platelets: 221 10*3/uL (ref 150–400)
RBC: 3.39 MIL/uL — ABNORMAL LOW (ref 3.87–5.11)
RDW: 15.9 % — ABNORMAL HIGH (ref 11.5–15.5)
WBC MORPHOLOGY: INCREASED
WBC: 42.6 10*3/uL — AB (ref 4.0–10.5)

## 2015-04-06 LAB — URINE MICROSCOPIC-ADD ON

## 2015-04-06 LAB — URINALYSIS, ROUTINE W REFLEX MICROSCOPIC
BILIRUBIN URINE: NEGATIVE
Glucose, UA: 100 mg/dL — AB
Hgb urine dipstick: NEGATIVE
Ketones, ur: NEGATIVE mg/dL
NITRITE: POSITIVE — AB
Protein, ur: 30 mg/dL — AB
SPECIFIC GRAVITY, URINE: 1.011 (ref 1.005–1.030)
UROBILINOGEN UA: 1 mg/dL (ref 0.0–1.0)
pH: 6 (ref 5.0–8.0)

## 2015-04-06 LAB — COMPREHENSIVE METABOLIC PANEL
ALK PHOS: 96 U/L (ref 38–126)
ALT: 13 U/L — AB (ref 14–54)
AST: 27 U/L (ref 15–41)
Albumin: 3.2 g/dL — ABNORMAL LOW (ref 3.5–5.0)
Anion gap: 14 (ref 5–15)
BUN: 84 mg/dL — ABNORMAL HIGH (ref 6–20)
CO2: 26 mmol/L (ref 22–32)
CREATININE: 3.29 mg/dL — AB (ref 0.44–1.00)
Calcium: 8.8 mg/dL — ABNORMAL LOW (ref 8.9–10.3)
Chloride: 92 mmol/L — ABNORMAL LOW (ref 101–111)
GFR calc Af Amer: 16 mL/min — ABNORMAL LOW (ref >60)
GFR, EST NON AFRICAN AMERICAN: 14 mL/min — AB (ref >60)
Glucose, Bld: 328 mg/dL — ABNORMAL HIGH (ref 65–99)
Potassium: 3.7 mmol/L (ref 3.5–5.1)
Sodium: 132 mmol/L — ABNORMAL LOW (ref 135–145)
Total Bilirubin: 1.6 mg/dL — ABNORMAL HIGH (ref 0.3–1.2)
Total Protein: 8 g/dL (ref 6.5–8.1)

## 2015-04-06 LAB — I-STAT CG4 LACTIC ACID, ED: LACTIC ACID, VENOUS: 4.37 mmol/L — AB (ref 0.5–2.0)

## 2015-04-06 LAB — BRAIN NATRIURETIC PEPTIDE: B Natriuretic Peptide: 510.5 pg/mL — ABNORMAL HIGH (ref 0.0–100.0)

## 2015-04-06 LAB — GLUCOSE, CAPILLARY: Glucose-Capillary: 359 mg/dL — ABNORMAL HIGH (ref 65–99)

## 2015-04-06 LAB — TROPONIN I: TROPONIN I: 0.06 ng/mL — AB (ref <0.031)

## 2015-04-06 MED ORDER — INSULIN ASPART 100 UNIT/ML ~~LOC~~ SOLN
6.0000 [IU] | Freq: Three times a day (TID) | SUBCUTANEOUS | Status: DC
  Administered 2015-04-07: 6 [IU] via SUBCUTANEOUS

## 2015-04-06 MED ORDER — ALBUTEROL SULFATE (2.5 MG/3ML) 0.083% IN NEBU
2.5000 mg | INHALATION_SOLUTION | Freq: Four times a day (QID) | RESPIRATORY_TRACT | Status: DC | PRN
  Administered 2015-04-12: 2.5 mg via RESPIRATORY_TRACT
  Filled 2015-04-06 (×2): qty 3

## 2015-04-06 MED ORDER — SODIUM CHLORIDE 0.9 % IV SOLN
INTRAVENOUS | Status: DC
  Administered 2015-04-06: 50 mL/h via INTRAVENOUS

## 2015-04-06 MED ORDER — ASPIRIN EC 81 MG PO TBEC
81.0000 mg | DELAYED_RELEASE_TABLET | Freq: Every day | ORAL | Status: DC
  Administered 2015-04-07 – 2015-04-20 (×14): 81 mg via ORAL
  Filled 2015-04-06 (×15): qty 1

## 2015-04-06 MED ORDER — INSULIN GLARGINE 100 UNIT/ML ~~LOC~~ SOLN
55.0000 [IU] | Freq: Two times a day (BID) | SUBCUTANEOUS | Status: DC
  Administered 2015-04-06 – 2015-04-09 (×7): 55 [IU] via SUBCUTANEOUS
  Filled 2015-04-06 (×9): qty 0.55

## 2015-04-06 MED ORDER — ACETAMINOPHEN 325 MG PO TABS: 650.0000 mg | ORAL_TABLET | Freq: Four times a day (QID) | ORAL | Status: DC | PRN

## 2015-04-06 MED ORDER — SODIUM CHLORIDE 0.9 % IV SOLN: 250.0000 mL | INTRAVENOUS | Status: DC | PRN

## 2015-04-06 MED ORDER — VANCOMYCIN HCL IN DEXTROSE 1-5 GM/200ML-% IV SOLN
1000.0000 mg | Freq: Once | INTRAVENOUS | Status: AC
  Administered 2015-04-06: 1000 mg via INTRAVENOUS
  Filled 2015-04-06: qty 200

## 2015-04-06 MED ORDER — SODIUM CHLORIDE 0.9 % IV SOLN
INTRAVENOUS | Status: DC
  Administered 2015-04-08: 08:00:00 via INTRAVENOUS
  Administered 2015-04-10: 1000 mL via INTRAVENOUS

## 2015-04-06 MED ORDER — ONDANSETRON HCL 4 MG/2ML IJ SOLN
4.0000 mg | Freq: Four times a day (QID) | INTRAMUSCULAR | Status: DC | PRN
  Administered 2015-04-06 – 2015-04-07 (×2): 4 mg via INTRAVENOUS
  Filled 2015-04-06 (×2): qty 2

## 2015-04-06 MED ORDER — LEVOTHYROXINE SODIUM 75 MCG PO TABS
75.0000 ug | ORAL_TABLET | Freq: Every day | ORAL | Status: DC
  Administered 2015-04-07 – 2015-04-20 (×14): 75 ug via ORAL
  Filled 2015-04-06 (×14): qty 1

## 2015-04-06 MED ORDER — PIPERACILLIN-TAZOBACTAM 3.375 G IVPB 30 MIN
3.3750 g | Freq: Once | INTRAVENOUS | Status: AC
  Administered 2015-04-06: 3.375 g via INTRAVENOUS
  Filled 2015-04-06: qty 50

## 2015-04-06 MED ORDER — FUROSEMIDE 10 MG/ML IJ SOLN
40.0000 mg | Freq: Once | INTRAMUSCULAR | Status: DC
  Filled 2015-04-06: qty 4

## 2015-04-06 MED ORDER — POTASSIUM CHLORIDE CRYS ER 20 MEQ PO TBCR
40.0000 meq | EXTENDED_RELEASE_TABLET | Freq: Two times a day (BID) | ORAL | Status: DC
  Administered 2015-04-06 – 2015-04-07 (×3): 40 meq via ORAL
  Filled 2015-04-06: qty 2
  Filled 2015-04-06: qty 4
  Filled 2015-04-06 (×3): qty 2

## 2015-04-06 MED ORDER — METOPROLOL SUCCINATE ER 25 MG PO TB24
25.0000 mg | ORAL_TABLET | Freq: Every day | ORAL | Status: DC
  Administered 2015-04-07 – 2015-04-19 (×13): 25 mg via ORAL
  Filled 2015-04-06 (×13): qty 1

## 2015-04-06 MED ORDER — HEPARIN SODIUM (PORCINE) 5000 UNIT/ML IJ SOLN
5000.0000 [IU] | Freq: Three times a day (TID) | INTRAMUSCULAR | Status: DC
  Administered 2015-04-06 – 2015-04-14 (×18): 5000 [IU] via SUBCUTANEOUS
  Filled 2015-04-06 (×20): qty 1

## 2015-04-06 MED ORDER — AMLODIPINE BESYLATE 5 MG PO TABS
5.0000 mg | ORAL_TABLET | Freq: Every day | ORAL | Status: DC
  Administered 2015-04-07 – 2015-04-12 (×6): 5 mg via ORAL
  Filled 2015-04-06 (×6): qty 1

## 2015-04-06 NOTE — ED Notes (Signed)
MD at bedside. 

## 2015-04-06 NOTE — ED Notes (Signed)
Bed placement confirms we are waiting on transfers before bed assignment. Pt made aware.

## 2015-04-06 NOTE — Progress Notes (Signed)
CRITICAL VALUE ALERT  Critical value received:  Lactic Acid 2.2  Date of notification:  04/07/15  Time of notification:  2359  Critical value read back:Yes.    Nurse who received alert:  Ronna Polio  MD notified (1st page):  McKeag  Time of first page:  0000  MD notified (2nd page): McKeag  Time of second page: 0005  Responding MD:  McKeag  Time MD responded:  [phone]

## 2015-04-06 NOTE — ED Notes (Addendum)
Legs "weeping with blisters" per pt-- hx cellulitis bilateral lower legs-- also short of breath on exertion, on home O2-- normally at 2l/m/[place]-- today has bumped up to 3L/m/[place]-- unable to get sats above 84% at triage on 2l/m/[place]

## 2015-04-06 NOTE — H&P (Signed)
PULMONARY / CRITICAL CARE MEDICINE   Name: Kimberly [name] MRN: [id number] DOB: [birth year]    ADMISSION DATE:  04/06/2015 CONSULTATION DATE:  04/06/15  REFERRING MD :  EDP   CHIEF COMPLAINT:  SOB, LE cellulitis,   INITIAL PRESENTATION: 59 y/o female with CHF, COPD who came to the [facility] ED On 7/26 after two days of right foot pain and redness, found to have severe sepsis.   STUDIES:    SIGNIFICANT EVENTS:    HISTORY OF PRESENT ILLNESS:  59 year old female with diastolic heart failure, COPD, and vascular disease came to the [facility] emergency department on 04/06/2015 complaining of right heel pain. She has an extensive past medical history and has had many admissions to our facility as well as multiple surgeries in the past for vascular disease. She has been followed by a local wound care clinic for several years. She notes that her foot had been doing fairly well despite years of problems which it previously been managed by the local wound care clinic. However, for the last 2 days she started to have increasing right heel pain and redness in the leg. This was associated with fatigue, malaise, and a low-grade fever. She has a chronic cough but there is no change in her daily mucus production. She denied dysuria, diarrhea, nausea, vomiting, or abdominal pain. She notes that she has not always been compliant with her visits with cardiology. However, she states that she continues to take her medications at home have been prescribed by cardiology and she says that she follows a very strict fluid restriction diet. She also notes increasing shortness of breath over the last several days. She has not weighed herself in several months because she said "my weight Going up despite following cardiology recommendations".  PAST MEDICAL HISTORY :   has a past medical history of HYPERLIPIDEMIA; HYPERTENSION; CAD, NATIVE VESSEL; PVD; DM; COPD; Edema; Stroke; CHF (congestive heart failure); Complication of  anesthesia; Cellulitis (10/15/2013); Chronic kidney disease; Diabetic neuropathy; Anasarca (05/2014); and Heart attack.  has past surgical history that includes Eye surgery and Carotid endarterectomy.   Prior to Admission medications   Medication Sig Start Date End Date Taking? Authorizing Provider  acetaminophen (TYLENOL) 325 MG tablet Take 650 mg by mouth every 6 (six) hours as needed for mild pain.    Historical Provider, MD  albuterol (PROVENTIL HFA;VENTOLIN HFA) 108 (90 BASE) MCG/ACT inhaler Inhale 1-2 puffs into the lungs every 6 (six) hours as needed for wheezing or shortness of breath.    Historical Provider, MD  amLODipine (NORVASC) 5 MG tablet TAKE 1 TABLET BY MOUTH EVERY DAY 08/14/14   Tiffany L Reed, DO  aspirin EC 81 MG tablet Take 81 mg by mouth daily.    Historical Provider, MD  insulin aspart (NOVOLOG) 100 UNIT/ML injection Inject 6 Units into the skin 3 (three) times daily with meals. 07/05/14   Zannie Cove, MD  insulin glargine (LANTUS) 100 UNIT/ML injection Inject 55 Units into the skin 2 (two) times daily.    Historical Provider, MD  levothyroxine (SYNTHROID, LEVOTHROID) 75 MCG tablet Take 75 mcg by mouth daily before breakfast.    Historical Provider, MD  metolazone (ZAROXOLYN) 5 MG tablet TAKE 1 TABLET BY MOUTH TWICE A WEEK (MONDAY AND FRIDAY) 01/12/15   Dolores Patty, MD  metoprolol succinate (TOPROL-XL) 50 MG 24 hr tablet Take 1 and 1/2 tabs daily = 75 mg daily Patient taking differently: 75 mg. Take 1 and 1/2 tabs daily = 75 mg daily  07/30/14   Beatrice Lecher, PA-C  Multiple Vitamins-Minerals (MULTIVITAMIN PO) Take 1 tablet by mouth daily.    Historical Provider, MD  potassium chloride SA (KLOR-CON M20) 20 MEQ tablet Take 2 tablets (40 mEq total) by mouth 2 (two) times daily. 09/18/14   Lewayne Bunting, MD  torsemide (DEMADEX) 20 MG tablet Take 3 tablets by mouth twice a day, Take an extra 20 mg if weight increase 3 lbs in one day 12/29/14   Lewayne Bunting, MD    Allergies  Allergen Reactions  . Epinephrine     Increased heart rate    FAMILY HISTORY:  indicated that her mother is deceased. She indicated that her father is deceased.  SOCIAL HISTORY:  reports that she quit smoking about 9 years ago. Her smoking use included Cigarettes. She has a 20 pack-year smoking history. She has never used smokeless tobacco. She reports that she does not drink alcohol or use illicit drugs.  REVIEW OF SYSTEMS:   Gen: + fever, denies chills, weight change, fatigue, night sweats HEENT: Denies blurred vision, double vision, hearing loss, tinnitus, sinus congestion, rhinorrhea, sore throat, neck stiffness, dysphagia PULM: mild increasing dyspnea CV: Denies chest pain, + edema, orthopnea, paroxysmal nocturnal dyspnea, palpitations GI: Denies abdominal pain, nausea, vomiting, diarrhea, hematochezia, melena, constipation, change in bowel habits GU: Denies dysuria, hematuria, polyuria, oliguria, urethral discharge Endocrine: Denies hot or cold intolerance, polyuria, polyphagia or appetite change Derm:+ rash, per HPI Heme: Denies easy bruising, bleeding, bleeding gums Neuro: Denies headache, numbness, weakness, slurred speech, loss of memory or consciousness   SUBJECTIVE:   VITAL SIGNS: Temp:  [98.8 F (37.1 C)-99.2 F (37.3 C)] 98.8 F (37.1 C) (07/26 1310) Pulse Rate:  [97-103] 98 (07/26 1315) Resp:  [24-26] 26 (07/26 1315) BP: (133-140)/(44-61) 134/52 mmHg (07/26 1315) SpO2:  [83 %-91 %] 90 % (07/26 1315) Weight:  [323 lb (146.512 kg)] 323 lb (146.512 kg) (07/26 1257) HEMODYNAMICS:   VENTILATOR SETTINGS:   INTAKE / OUTPUT: No intake or output data in the 24 hours ending 04/06/15 1524  PHYSICAL EXAMINATION: General:  Obese, tearful Neuro:  A&OX4, MAEW HEENT:  NCAT, EOMi, mucus membranes and lips profoundly dry Cardiovascular:  RRR, no mgr Lungs:  Scant rales bases, good air movement, normal effort Abdomen:  BS+, soft Musculoskeletal:  Normal  bulk and tone Skin:  Right heal blister with black tissue under skin, approximately 5cm diameter, surrounding redness also with redness and chronic venous stasis changes of R leg > Left leg  LABS:  CBC  Recent Labs Lab 04/06/15 1413  WBC 42.6*  HGB 10.1*  HCT 31.0*  PLT 221   Coag's No results for input(s): APTT, INR in the last 168 hours. BMET  Recent Labs Lab 04/06/15 1413  NA 132*  K 3.7  CL 92*  CO2 26  BUN 84*  CREATININE 3.29*  GLUCOSE 328*   Electrolytes  Recent Labs Lab 04/06/15 1413  CALCIUM 8.8*   Sepsis Markers  Recent Labs Lab 04/06/15 1428  LATICACIDVEN 4.37*   ABG No results for input(s): PHART, PCO2ART, PO2ART in the last 168 hours. Liver Enzymes  Recent Labs Lab 04/06/15 1413  AST 27  ALT 13*  ALKPHOS 96  BILITOT 1.6*  ALBUMIN 3.2*   Cardiac Enzymes  Recent Labs Lab 04/06/15 1413  TROPONINI 0.06*   Glucose No results for input(s): GLUCAP in the last 168 hours.  Imaging 7/26 CXR port > cardiomegaly, interstitial edema, no frank pulmonary edema   ASSESSMENT / PLAN:  PULMONARY OETT n/a A: Dyspnea - in setting of chronic volume overload, suspected CHF - last weight found in system from 09/2014 of 262 lbs COPD, not in exacerbation P:   Oxygen as needed to support sats > 90% Pulmonary hygiene  DNI  PRN albuterol   CARDIOVASCULAR CVL A:  Chronic CHF > weight up significantly (09/2014 262 lbs) but mucus membranes profoundly dry Sepsis  PVD  P:  Gentle IVF, monitor respiratory status carefully Continue toprolol 1/2 home dose Hold home diuretics ICU monitoring  Repeat lactic acid  RENAL A:   CKD - baseline sr cr ~2.0 P:   Cautious fluid administration - NS @ 50 ml / hr Trend BMP / UOP  Replace electrolytes as indicated   GASTROINTESTINAL A:   Morbid Obesity  Dysphagia - post CVA, multiple swallows, pills with applesauce P:   Heart healthy, carb modified diet   HEMATOLOGIC A:  Anemia   P:  Trend CBC   DVT Prophylaxis:  Heparin sq   INFECTIOUS A:   Sepsis - suspected BLE, RLE heel wound as source Bilateral Lower Extremity Cellulitis  R Heel Wound P:   BCx2 7/26 >   Vanco, start date 7/26, day 1/x Zosyn, start date 7/26, day 1/x  Consult orthopedics for heel debridement Consult wound care  ENDOCRINE A:   DM  P:   SSI   NEUROLOGIC A:   Hx CVA  P:   Dysphagia diet  Code Status: DNR. I had a lengthy conversation with the patient and her husband. She has numerous comorbid illnesses and has a long history of non-compliance.  She has had multiple complications in the past.  She tells me that she has a poor quality of life.  She states that she does not want to have any machines help her live.  She does not want CPR, mechanical ventilation, or hemodialysis.   FAMILY  - Updates: husband updated bedside  - Inter-disciplinary family meet or Palliative Care meeting due by:  day 7   Heber [place], MD [place] [facility] Pager: [phone] Cell: [phone] After 3pm or if no response, call [phone]

## 2015-04-06 NOTE — Consult Note (Signed)
Reason for Consult: Cellulitis ulceration bilateral lower extremity Referring Physician: Dr. Alvan Dame Kimberly [name] is an 59 y.o. female.  HPI: Patient is a 59 year old woman with type 2 diabetes uncontrolled who has been followed at the wound center at [facility]. Patient is recently been released. She presents at this time with massive cellulitis of both lower extremities as well as a new right heel decubitus ulcer.  Past Medical History  Diagnosis Date  . HYPERLIPIDEMIA   . HYPERTENSION   . CAD, NATIVE VESSEL     May 10, 2010 cath showed a hyperdynamic LV function, she had dominant circumflex anatomy with a 70-80% small OM1. She had diffuse diabetic plaque particularly in the distal LAD. She nondominant RCA.  Nondominant  . PVD     CEA  . DM   . COPD   . Edema   . Stroke   . CHF (congestive heart failure)     Preserved EF  . Complication of anesthesia     DIFFICULT WAKING   . Cellulitis 10/15/2013    BILATERAL  . Chronic kidney disease   . Diabetic neuropathy   . Anasarca 05/2014  . Heart attack     Past Surgical History  Procedure Laterality Date  . Eye surgery    . Carotid endarterectomy      Family History  Problem Relation Age of Onset  . Cancer Mother     Breast, NHL  . Stroke Mother   . Peripheral vascular disease Father   . CAD Father 53  . Heart attack Father   . Hypertension Father   . Asthma Father     Social History:  reports that she quit smoking about 9 years ago. Her smoking use included Cigarettes. She has a 20 pack-year smoking history. She has never used smokeless tobacco. She reports that she does not drink alcohol or use illicit drugs.  Allergies:  Allergies  Allergen Reactions  . Epinephrine     Increased heart rate    Medications: I have reviewed the patient's current medications.  Results for orders placed or performed during the hospital encounter of 04/06/15 (from the past 48 hour(s))  Comprehensive metabolic panel     Status:  Abnormal   Collection Time: 04/06/15  2:13 PM  Result Value Ref Range   Sodium 132 (L) 135 - 145 mmol/L   Potassium 3.7 3.5 - 5.1 mmol/L   Chloride 92 (L) 101 - 111 mmol/L   CO2 26 22 - 32 mmol/L   Glucose, Bld 328 (H) 65 - 99 mg/dL   BUN 84 (H) 6 - 20 mg/dL   Creatinine, Ser 3.29 (H) 0.44 - 1.00 mg/dL   Calcium 8.8 (L) 8.9 - 10.3 mg/dL   Total Protein 8.0 6.5 - 8.1 g/dL   Albumin 3.2 (L) 3.5 - 5.0 g/dL   AST 27 15 - 41 U/L   ALT 13 (L) 14 - 54 U/L   Alkaline Phosphatase 96 38 - 126 U/L   Total Bilirubin 1.6 (H) 0.3 - 1.2 mg/dL   GFR calc non Af Amer 14 (L) >60 mL/min   GFR calc Af Amer 16 (L) >60 mL/min    Comment: (NOTE) The eGFR has been calculated using the CKD EPI equation. This calculation has not been validated in all clinical situations. eGFR's persistently <60 mL/min signify possible Chronic Kidney Disease.    Anion gap 14 5 - 15  Brain natriuretic peptide     Status: Abnormal   Collection Time: 04/06/15  2:13 PM  Result Value Ref Range   B Natriuretic Peptide 510.5 (H) 0.0 - 100.0 pg/mL  Troponin I     Status: Abnormal   Collection Time: 04/06/15  2:13 PM  Result Value Ref Range   Troponin I 0.06 (H) <0.031 ng/mL    Comment:        PERSISTENTLY INCREASED TROPONIN VALUES IN THE RANGE OF 0.04-0.49 ng/mL CAN BE SEEN IN:       -UNSTABLE ANGINA       -CONGESTIVE HEART FAILURE       -MYOCARDITIS       -CHEST TRAUMA       -ARRYHTHMIAS       -LATE PRESENTING MYOCARDIAL INFARCTION       -COPD   CLINICAL FOLLOW-UP RECOMMENDED.   CBC with Differential     Status: Abnormal   Collection Time: 04/06/15  2:13 PM  Result Value Ref Range   WBC 42.6 (H) 4.0 - 10.5 K/uL    Comment: REPEATED TO VERIFY   RBC 3.39 (L) 3.87 - 5.11 MIL/uL   Hemoglobin 10.1 (L) 12.0 - 15.0 g/dL   HCT 31.0 (L) 36.0 - 46.0 %   MCV 91.4 78.0 - 100.0 fL   MCH 29.8 26.0 - 34.0 pg   MCHC 32.6 30.0 - 36.0 g/dL   RDW 15.9 (H) 11.5 - 15.5 %   Platelets 221 150 - 400 K/uL   Neutrophils Relative %  97 (H) 43 - 77 %   Lymphocytes Relative 1 (L) 12 - 46 %   Monocytes Relative 2 (L) 3 - 12 %   Eosinophils Relative 0 0 - 5 %   Basophils Relative 0 0 - 1 %   Neutro Abs 41.3 (H) 1.7 - 7.7 K/uL   Lymphs Abs 0.4 (L) 0.7 - 4.0 K/uL   Monocytes Absolute 0.9 0.1 - 1.0 K/uL   Eosinophils Absolute 0.0 0.0 - 0.7 K/uL   Basophils Absolute 0.0 0.0 - 0.1 K/uL   RBC Morphology POLYCHROMASIA PRESENT    WBC Morphology INCREASED BANDS (>20% BANDS)     Comment: TOXIC GRANULATION VACUOLATED NEUTROPHILS   I-Stat CG4 Lactic Acid, ED     Status: Abnormal   Collection Time: 04/06/15  2:28 PM  Result Value Ref Range   Lactic Acid, Venous 4.37 (HH) 0.5 - 2.0 mmol/L   Comment NOTIFIED PHYSICIAN   Urinalysis, Routine w reflex microscopic (not at [facility])     Status: Abnormal   Collection Time: 04/06/15  2:52 PM  Result Value Ref Range   Color, Urine YELLOW YELLOW   APPearance CLOUDY (A) CLEAR   Specific Gravity, Urine 1.011 1.005 - 1.030   pH 6.0 5.0 - 8.0   Glucose, UA 100 (A) NEGATIVE mg/dL   Hgb urine dipstick NEGATIVE NEGATIVE   Bilirubin Urine NEGATIVE NEGATIVE   Ketones, ur NEGATIVE NEGATIVE mg/dL   Protein, ur 30 (A) NEGATIVE mg/dL   Urobilinogen, UA 1.0 0.0 - 1.0 mg/dL   Nitrite POSITIVE (A) NEGATIVE   Leukocytes, UA MODERATE (A) NEGATIVE  Urine microscopic-add on     Status: None   Collection Time: 04/06/15  2:52 PM  Result Value Ref Range   Squamous Epithelial / LPF RARE RARE   WBC, UA 7-10 <3 WBC/hpf   RBC / HPF 0-2 <3 RBC/hpf   Bacteria, UA RARE RARE    Dg Chest Port 1 View  04/06/2015   CLINICAL DATA:  Week and blisters on both legs. Cellulitis. Shortness of breath on exertion.  EXAM: PORTABLE CHEST - 1 VIEW  COMPARISON:  Single view of the chest 06/24/2014.  FINDINGS: There is cardiomegaly and vascular congestion. No consolidative process, pneumothorax or effusion is identified.  IMPRESSION: Cardiomegaly and pulmonary vascular congestion.   Electronically Signed   By: Inge Rise  M.D.   On: 04/06/2015 14:31    Review of Systems  All other systems reviewed and are negative.  Blood pressure 127/56, pulse 94, temperature 98.8 F (37.1 C), temperature source Oral, resp. rate 16, height _0  (1.626 m), weight 146.512 kg (323 lb), SpO2 97 %. Physical Exam On examination patient is alert oriented she has massive cellulitis with dermatitis venous stasis changes and lymphedema changes in bilateral lower extremities with massive swelling of both legs. She does not have palpable pulses secondary to her swelling. Her foot shows no ischemic changes. She has a new superficial decubitus ulcer on the right heel. Assessment/Plan: Assessment: Diabetic insensate neuropathy with venous stasis lymphedema swelling in both lower extremities with massive cellulitis in both legs right worse than left with a new decubitus heel ulcer on the right.  Plan: I feel this should be well-managed with compression, IV antibiotics, and pressure unloading. I will write orders for the compression wrap and the pressure unloading boots. Patient can follow up with myself or with the [facility] wound center where she is an established patient. I will follow-up as needed.  DUDA,MARCUS V 04/06/2015, 5:27 PM

## 2015-04-06 NOTE — ED Provider Notes (Signed)
CSN: [id number]     Arrival date & time 04/06/15  1207 History   First MD Initiated Contact with Patient 04/06/15 1322     Chief Complaint  Patient presents with  . Recurrent Skin Infections  . Cellulitis  . Shortness of Breath     (Consider location/radiation/quality/duration/timing/severity/associated sxs/prior Treatment) HPI For 3 weeks the patient has had increasing formation of blisters and weeping of her lower legs. She reports she's had a history of cellulitis. This condition has been worsening. Last night she noticed significant pain in her right heel. Examining the heel she realized that it had a very big, it is dark appearing blister. The patient states yesterday evening she felt achy and was nauseated. She has not had a documented fever that she is aware of. The patient poor she is chronically on oxygen and has noted a little bit of increasing shortness of breath. Yesterday she had to increase her oxygen to 3 L. She reports she also has a history of congestive heart failure and had to be hospitalized last year to have significant amount of fluid withdrawn. Past Medical History  Diagnosis Date  . HYPERLIPIDEMIA   . HYPERTENSION   . CAD, NATIVE VESSEL     May 10, 2010 cath showed a hyperdynamic LV function, she had dominant circumflex anatomy with a 70-80% small OM1. She had diffuse diabetic plaque particularly in the distal LAD. She nondominant RCA.  Nondominant  . PVD     CEA  . DM   . COPD   . Edema   . Stroke   . CHF (congestive heart failure)     Preserved EF  . Complication of anesthesia     DIFFICULT WAKING   . Cellulitis 10/15/2013    BILATERAL  . Chronic kidney disease   . Diabetic neuropathy   . Anasarca 05/2014  . Heart attack    Past Surgical History  Procedure Laterality Date  . Eye surgery    . Carotid endarterectomy     Family History  Problem Relation Age of Onset  . Cancer Mother     Breast, NHL  . Stroke Mother   . Peripheral vascular  disease Father   . CAD Father 24  . Heart attack Father   . Hypertension Father   . Asthma Father    History  Substance Use Topics  . Smoking status: Former Smoker -- 1.00 packs/day for 20 years    Types: Cigarettes    Quit date: 09/11/2005  . Smokeless tobacco: Never Used  . Alcohol Use: No   OB History    No data available     Review of Systems 10 Systems reviewed and are negative for acute change except as noted in the HPI.   Allergies  Epinephrine  Home Medications   Prior to Admission medications   Medication Sig Start Date End Date Taking? Authorizing Provider  acetaminophen (TYLENOL) 325 MG tablet Take 650 mg by mouth every 6 (six) hours as needed for mild pain.    Historical Provider, MD  albuterol (PROVENTIL HFA;VENTOLIN HFA) 108 (90 BASE) MCG/ACT inhaler Inhale 1-2 puffs into the lungs every 6 (six) hours as needed for wheezing or shortness of breath.    Historical Provider, MD  amLODipine (NORVASC) 5 MG tablet TAKE 1 TABLET BY MOUTH EVERY DAY 08/14/14   Tiffany L Reed, DO  aspirin EC 81 MG tablet Take 81 mg by mouth daily.    Historical Provider, MD  insulin aspart (NOVOLOG) 100 UNIT/ML injection  Inject 6 Units into the skin 3 (three) times daily with meals. 07/05/14   Zannie Cove, MD  insulin glargine (LANTUS) 100 UNIT/ML injection Inject 55 Units into the skin 2 (two) times daily.    Historical Provider, MD  levothyroxine (SYNTHROID, LEVOTHROID) 75 MCG tablet Take 75 mcg by mouth daily before breakfast.    Historical Provider, MD  metolazone (ZAROXOLYN) 5 MG tablet TAKE 1 TABLET BY MOUTH TWICE A WEEK (MONDAY AND FRIDAY) 01/12/15   Dolores Patty, MD  metoprolol succinate (TOPROL-XL) 50 MG 24 hr tablet Take 1 and 1/2 tabs daily = 75 mg daily Patient taking differently: 75 mg. Take 1 and 1/2 tabs daily = 75 mg daily 07/30/14   Beatrice Lecher, PA-C  Multiple Vitamins-Minerals (MULTIVITAMIN PO) Take 1 tablet by mouth daily.    Historical Provider, MD  potassium  chloride SA (KLOR-CON M20) 20 MEQ tablet Take 2 tablets (40 mEq total) by mouth 2 (two) times daily. 09/18/14   Lewayne Bunting, MD  torsemide (DEMADEX) 20 MG tablet Take 3 tablets by mouth twice a day, Take an extra 20 mg if weight increase 3 lbs in one day 12/29/14   Lewayne Bunting, MD   BP 127/53 mmHg  Pulse 99  Temp(Src) 98.8 F (37.1 C) (Oral)  Resp 23  Ht 5\' 4"  (1.626 m)  Wt 323 lb (146.512 kg)  BMI 55.42 kg/m2  SpO2 92% Physical Exam  Constitutional: She is oriented to person, place, and time.  Patient is morbidly obese. She is nontoxic and alert. She has mild to moderate increased work of breathing. Color is good.  HENT:  Head: Normocephalic and atraumatic.  Eyes: EOM are normal. Pupils are equal, round, and reactive to light.  Neck: Neck supple.  Cardiovascular: Intact distal pulses.   Tachycardic. Distant heart sounds.  Pulmonary/Chest:  Increased work of breathing. Patient is speaking in full sentences and appropriately alert.   Abdominal:  Abdomen is very firm and distended. She is nontender. Abdomen is morbidly obese.  Musculoskeletal:  Patient has severe edema with vesicle formation and transudate. There is also a large approximately 5 cm soft, hemorrhagic bullae of the left heel. See the images attached.  Neurological: She is alert and oriented to person, place, and time. No cranial nerve deficit. Coordination normal.  Skin: Skin is warm and dry.            ED Course  Procedures (including critical care time) Labs Review Labs Reviewed  COMPREHENSIVE METABOLIC PANEL - Abnormal; Notable for the following:    Sodium 132 (*)    Chloride 92 (*)    Glucose, Bld 328 (*)    BUN 84 (*)    Creatinine, Ser 3.29 (*)    Calcium 8.8 (*)    Albumin 3.2 (*)    ALT 13 (*)    Total Bilirubin 1.6 (*)    GFR calc non Af Amer 14 (*)    GFR calc Af Amer 16 (*)    All other components within normal limits  BRAIN NATRIURETIC PEPTIDE - Abnormal; Notable for the  following:    B Natriuretic Peptide 510.5 (*)    All other components within normal limits  TROPONIN I - Abnormal; Notable for the following:    Troponin I 0.06 (*)    All other components within normal limits  CBC WITH DIFFERENTIAL/PLATELET - Abnormal; Notable for the following:    WBC 42.6 (*)    RBC 3.39 (*)    Hemoglobin 10.1 (*)  HCT 31.0 (*)    RDW 15.9 (*)    Neutrophils Relative % 97 (*)    Lymphocytes Relative 1 (*)    Monocytes Relative 2 (*)    Neutro Abs 41.3 (*)    Lymphs Abs 0.4 (*)    All other components within normal limits  URINALYSIS, ROUTINE W REFLEX MICROSCOPIC (NOT AT [facility]) - Abnormal; Notable for the following:    APPearance CLOUDY (*)    Glucose, UA 100 (*)    Protein, ur 30 (*)    Nitrite POSITIVE (*)    Leukocytes, UA MODERATE (*)    All other components within normal limits  I-STAT CG4 LACTIC ACID, ED - Abnormal; Notable for the following:    Lactic Acid, Venous 4.37 (*)    All other components within normal limits  CULTURE, BLOOD (ROUTINE X 2)  CULTURE, BLOOD (ROUTINE X 2)  URINE MICROSCOPIC-ADD ON    Imaging Review Dg Chest Port 1 View  04/06/2015   CLINICAL DATA:  Week and blisters on both legs. Cellulitis. Shortness of breath on exertion.  EXAM: PORTABLE CHEST - 1 VIEW  COMPARISON:  Single view of the chest 06/24/2014.  FINDINGS: There is cardiomegaly and vascular congestion. No consolidative process, pneumothorax or effusion is identified.  IMPRESSION: Cardiomegaly and pulmonary vascular congestion.   Electronically Signed   By: [name] M.D.   On: 04/06/2015 14:31     EKG Interpretation   Date/Time:  Tuesday April 06 2015 14:00:11 EDT Ventricular Rate:  101 PR Interval:  176 QRS Duration: 141 QT Interval:  412 QTC Calculation: 534 R Axis:   4 Text Interpretation:  Sinus tachycardia Left bundle branch block Baseline  wander in lead(s) V2 Confirmed by Donnald Garre, MD, Lebron Conners [phone]) on 04/06/2015  3:49:50 PM     Consult:  Intensivist has been consult regarding patient management. At this point the patient is septic. She also is exhibiting signs of acute CHF. Patient's mental status is clear and alert. Her respiratory distress is mild on supplemental O2 greater than her baseline. I have concern that with sepsis fluid bolusing, the patient will go into florid CHF. The hospital's advises they will evaluate the patient in the emergency department for admission and volume management in light of CHF with sepsis. MDM   Final diagnoses:  Cellulitis of lower extremity, unspecified laterality  Diabetic foot infection  Sepsis, due to unspecified organism  Acute exacerbation of CHF (congestive heart failure)   CRITICAL CARE Performed by: Arby Barrette   Total critical care time: 45  Critical care time was exclusive of separately billable procedures and treating other patients.  Critical care was necessary to treat or prevent imminent or life-threatening deterioration.  Critical care was time spent personally by me on the following activities: development of treatment plan with patient and/or surrogate as well as nursing, discussions with consultants, evaluation of patient's response to treatment, examination of patient, obtaining history from patient or surrogate, ordering and performing treatments and interventions, ordering and review of laboratory studies, ordering and review of radiographic studies, pulse oximetry and re-evaluation of patient's condition.    Arby Barrette, MD 04/06/15 [phone]

## 2015-04-07 DIAGNOSIS — I509 Heart failure, unspecified: Secondary | ICD-10-CM | POA: Diagnosis present

## 2015-04-07 DIAGNOSIS — L03119 Cellulitis of unspecified part of limb: Secondary | ICD-10-CM

## 2015-04-07 DIAGNOSIS — E1169 Type 2 diabetes mellitus with other specified complication: Secondary | ICD-10-CM

## 2015-04-07 DIAGNOSIS — L8990 Pressure ulcer of unspecified site, unspecified stage: Secondary | ICD-10-CM | POA: Diagnosis present

## 2015-04-07 DIAGNOSIS — L089 Local infection of the skin and subcutaneous tissue, unspecified: Secondary | ICD-10-CM

## 2015-04-07 DIAGNOSIS — E11628 Type 2 diabetes mellitus with other skin complications: Secondary | ICD-10-CM | POA: Diagnosis present

## 2015-04-07 LAB — GLUCOSE, CAPILLARY
GLUCOSE-CAPILLARY: 262 mg/dL — AB (ref 65–99)
Glucose-Capillary: 331 mg/dL — ABNORMAL HIGH (ref 65–99)
Glucose-Capillary: 241 mg/dL — ABNORMAL HIGH (ref 65–99)
Glucose-Capillary: 191 mg/dL — ABNORMAL HIGH (ref 65–99)
Glucose-Capillary: 208 mg/dL — ABNORMAL HIGH (ref 65–99)
Glucose-Capillary: 159 mg/dL — ABNORMAL HIGH (ref 65–99)

## 2015-04-07 LAB — CBC
HEMATOCRIT: 29.2 % — AB (ref 36.0–46.0)
Hemoglobin: 9.5 g/dL — ABNORMAL LOW (ref 12.0–15.0)
MCH: 30 pg (ref 26.0–34.0)
MCHC: 32.5 g/dL (ref 30.0–36.0)
MCV: 92.1 fL (ref 78.0–100.0)
Platelets: 174 10*3/uL (ref 150–400)
RBC: 3.17 MIL/uL — AB (ref 3.87–5.11)
RDW: 16 % — AB (ref 11.5–15.5)
WBC: 22.1 10*3/uL — ABNORMAL HIGH (ref 4.0–10.5)

## 2015-04-07 LAB — BASIC METABOLIC PANEL
ANION GAP: 9 (ref 5–15)
BUN: 81 mg/dL — ABNORMAL HIGH (ref 6–20)
CALCIUM: 8.6 mg/dL — AB (ref 8.9–10.3)
CO2: 27 mmol/L (ref 22–32)
Chloride: 97 mmol/L — ABNORMAL LOW (ref 101–111)
Creatinine, Ser: 3 mg/dL — ABNORMAL HIGH (ref 0.44–1.00)
GFR, EST AFRICAN AMERICAN: 18 mL/min — AB (ref >60)
GFR, EST NON AFRICAN AMERICAN: 16 mL/min — AB (ref >60)
Glucose, Bld: 372 mg/dL — ABNORMAL HIGH (ref 65–99)
Potassium: 3.8 mmol/L (ref 3.5–5.1)
Sodium: 133 mmol/L — ABNORMAL LOW (ref 135–145)

## 2015-04-07 LAB — MAGNESIUM: Magnesium: 1.9 mg/dL (ref 1.7–2.4)

## 2015-04-07 LAB — PHOSPHORUS: Phosphorus: 4 mg/dL (ref 2.5–4.6)

## 2015-04-07 LAB — MRSA PCR SCREENING: MRSA BY PCR: POSITIVE — AB

## 2015-04-07 MED ORDER — MUPIROCIN 2 % EX OINT
1.0000 "application " | TOPICAL_OINTMENT | Freq: Two times a day (BID) | CUTANEOUS | Status: AC
  Administered 2015-04-07 – 2015-04-11 (×9): 1 via NASAL
  Filled 2015-04-07 (×2): qty 22

## 2015-04-07 MED ORDER — VANCOMYCIN HCL 10 G IV SOLR: 1500.0000 mg | INTRAVENOUS | Status: DC

## 2015-04-07 MED ORDER — PIPERACILLIN-TAZOBACTAM 3.375 G IVPB
3.3750 g | Freq: Three times a day (TID) | INTRAVENOUS | Status: DC
  Administered 2015-04-07 – 2015-04-10 (×12): 3.375 g via INTRAVENOUS
  Filled 2015-04-07 (×16): qty 50

## 2015-04-07 MED ORDER — VANCOMYCIN HCL 10 G IV SOLR
1500.0000 mg | Freq: Once | INTRAVENOUS | Status: AC
  Administered 2015-04-07: 1500 mg via INTRAVENOUS
  Filled 2015-04-07: qty 1500

## 2015-04-07 MED ORDER — INSULIN ASPART 100 UNIT/ML ~~LOC~~ SOLN
2.0000 [IU] | SUBCUTANEOUS | Status: DC
  Administered 2015-04-07: 6 [IU] via SUBCUTANEOUS

## 2015-04-07 MED ORDER — CHLORHEXIDINE GLUCONATE CLOTH 2 % EX PADS
6.0000 | MEDICATED_PAD | Freq: Every day | CUTANEOUS | Status: AC
  Administered 2015-04-07 – 2015-04-11 (×5): 6 via TOPICAL

## 2015-04-07 MED ORDER — INSULIN ASPART 100 UNIT/ML ~~LOC~~ SOLN
5.0000 [IU] | Freq: Once | SUBCUTANEOUS | Status: AC
  Administered 2015-04-07: 5 [IU] via SUBCUTANEOUS

## 2015-04-07 MED ORDER — NITROGLYCERIN 0.4 MG SL SUBL
SUBLINGUAL_TABLET | SUBLINGUAL | Status: AC
  Filled 2015-04-07: qty 1

## 2015-04-07 MED ORDER — NITROGLYCERIN 0.4 MG SL SUBL
0.4000 mg | SUBLINGUAL_TABLET | SUBLINGUAL | Status: DC | PRN
Start: 2015-04-07 — End: 2015-04-20
  Administered 2015-04-07 (×2): 0.4 mg via SUBLINGUAL

## 2015-04-07 MED ORDER — INSULIN ASPART 100 UNIT/ML ~~LOC~~ SOLN
2.0000 [IU] | Freq: Three times a day (TID) | SUBCUTANEOUS | Status: DC
Start: 2015-04-07 — End: 2015-04-15
  Administered 2015-04-07: 6 [IU] via SUBCUTANEOUS
  Administered 2015-04-08 (×2): 2 [IU] via SUBCUTANEOUS
  Administered 2015-04-09: 4 [IU] via SUBCUTANEOUS
  Administered 2015-04-09: 2 [IU] via SUBCUTANEOUS
  Administered 2015-04-09: 4 [IU] via SUBCUTANEOUS
  Administered 2015-04-10 – 2015-04-11 (×3): 2 [IU] via SUBCUTANEOUS
  Administered 2015-04-12 – 2015-04-13 (×2): 4 [IU] via SUBCUTANEOUS
  Administered 2015-04-14: 2 [IU] via SUBCUTANEOUS
  Filled 2015-04-07: qty 1

## 2015-04-07 MED ORDER — INSULIN ASPART 100 UNIT/ML ~~LOC~~ SOLN
6.0000 [IU] | Freq: Three times a day (TID) | SUBCUTANEOUS | Status: DC
  Administered 2015-04-07 – 2015-04-14 (×16): 6 [IU] via SUBCUTANEOUS

## 2015-04-07 NOTE — Progress Notes (Signed)
PULMONARY / CRITICAL CARE MEDICINE   Name: Kimberly [name] MRN: [id number] DOB: 11-Jun-1956    ADMISSION DATE:  04/06/2015 CONSULTATION DATE:  04/06/15  REFERRING MD :  ED  CHIEF COMPLAINT:  Shortness of Breath, Lower Extremity Cellulitis  INITIAL PRESENTATION: 60 y/o female with CHF, COPD who came to the [facility] ED On 7/26 after two days of right foot pain and redness, found to have severe sepsis.   STUDIES:  7/26: cardiomegaly, pulmonary vascular congestion  SIGNIFICANT EVENTS: 7/26: presented to ED, admitted to ICU  HISTORY OF PRESENT ILLNESS:  59 year old female with diastolic heart failure, COPD, and vascular disease came to the [facility] emergency department on 04/06/2015 complaining of right heel pain. For the last 2 days she started to have increasing right heel pain and redness in the leg. This was associated with fatigue, malaise, and a low-grade fever. She has a chronic cough but there is no change in her daily mucus production. She denied dysuria, diarrhea, nausea, vomiting, or abdominal pain.  She notes that she has not always been compliant with her visits with cardiology. However, she states that she continues to take her medications at home have been prescribed by cardiology and she says that she follows a very strict fluid restriction diet. She also notes increasing shortness of breath over the last several days.   SUBJECTIVE: no pressors, responded to fluid  VITAL SIGNS: Temp:  [98.6 F (37 C)-99.4 F (37.4 C)] 98.9 F (37.2 C) (07/27 0334) Pulse Rate:  [86-103] 86 (07/27 0600) Resp:  [16-30] 21 (07/27 0600) BP: (100-159)/(21-73) 100/48 mmHg (07/27 0600) SpO2:  [83 %-98 %] 95 % (07/27 0600) Weight:  [323 lb (146.512 kg)] 323 lb (146.512 kg) (07/26 1257) HEMODYNAMICS:   VENTILATOR SETTINGS:   INTAKE / OUTPUT:  Intake/Output Summary (Last 24 hours) at 04/07/15 0716 Last data filed at 04/07/15 0612  Gross per 24 hour  Intake   1470 ml  Output    750 ml  Net    720  ml    PHYSICAL EXAMINATION: General:  59yo female resting comfortably in no apparent distress Neuro:  No focal deficits, alert, oriented x3, following commands HEENT:  MMM, PERRLA Cardiovascular:  S1 and S2 noted, regular rate and rhythm Lungs: Bilateral crackles, equal breath sounds Abdomen: Obese, bowel sounds noted, soft and nondistended, RUQ pain (chronic per patient) Musculoskeletal:  Significant edema of bilateral lower extremities, boot in place on right foot Skin: Erythema of bilateral lower extremities  LABS:  CBC  Recent Labs Lab 04/06/15 1413 04/07/15 0250  WBC 42.6* 22.1*  HGB 10.1* 9.5*  HCT 31.0* 29.2*  PLT 221 174   Coag's No results for input(s): APTT, INR in the last 168 hours. BMET  Recent Labs Lab 04/06/15 1413 04/07/15 0250  NA 132* 133*  K 3.7 3.8  CL 92* 97*  CO2 26 27  BUN 84* 81*  CREATININE 3.29* 3.00*  GLUCOSE 328* 372*   Electrolytes  Recent Labs Lab 04/06/15 1413 04/07/15 0250  CALCIUM 8.8* 8.6*  MG  --  1.9  PHOS  --  4.0   Sepsis Markers  Recent Labs Lab 04/06/15 1428 04/06/15 2252  LATICACIDVEN 4.37* 2.2*   ABG No results for input(s): PHART, PCO2ART, PO2ART in the last 168 hours. Liver Enzymes  Recent Labs Lab 04/06/15 1413  AST 27  ALT 13*  ALKPHOS 96  BILITOT 1.6*  ALBUMIN 3.2*   Cardiac Enzymes  Recent Labs Lab 04/06/15 1413  TROPONINI 0.06*  Glucose  Recent Labs Lab 04/06/15 2147 04/06/15 2355  GLUCAP 359* 331*    Imaging Dg Chest Port 1 View  04/06/2015   CLINICAL DATA:  Week and blisters on both legs. Cellulitis. Shortness of breath on exertion.  EXAM: PORTABLE CHEST - 1 VIEW  COMPARISON:  Single view of the chest 06/24/2014.  FINDINGS: There is cardiomegaly and vascular congestion. No consolidative process, pneumothorax or effusion is identified.  IMPRESSION: Cardiomegaly and pulmonary vascular congestion.   Electronically Signed   By: [name] M.D.   On: 04/06/2015 14:31      ASSESSMENT / PLAN:  PULMONARY OETT n/a A: Dyspnea, suspected CHF  COPD, not in exacerbation P:  Oxygen as needed to support sats > 90% Pulmonary hygiene  DNI  PRN albuterol  Even balance when able with chf history  CARDIOVASCULAR CVL A:  Chronic CHF > weight up significantly (09/2014 262 lbs, currently 323) (BNP 510.5) PVD  LA clearance P:  Gentle IVF to kvo whn able with chf Continue Metoprolol 1/2 home dose Amlodipine  RENAL A:  CKD (3) - baseline sr cr ~2.0 Mild Hyponatremia (133) ATN post sepsis P:  Cautious fluid administration - NS to kvo, no pressors, chf Trend BMP / UOP  Replace electrolytes as indicated   GASTROINTESTINAL A:  Morbid Obesity  Dysphagia - post CVA, multiple swallows, pills with applesauce  P:  Heart healthy, carb modified diet   HEMATOLOGIC A:  Anemia(9.5) Leukocytosis (42.6 >>22.1)--Improving  P:  Trend CBC  DVT Prophylaxis: Heparin sq   INFECTIOUS A:  Sepsis - suspected BLE, RLE heel wound as source, lactic acid 2.2 Bilateral Lower Extremity Cellulitis  R Heel Wound UTI (moderate leukocytes, positive nitrites)  P:  BCx2 7/26 >  Obtain urine culture  Vanco, start date 7/26>>>7/27 Zosyn, start date 7/26>>>  Orthopedics consulted. Recommend management with compression, IV antibiotics, pressure unloading. Consult wound care Narrow zosyn after ID sens noted If declines, CT  ENDOCRINE A:  DM  Hypothyroidism Hyperglycemia P:  Lantus 55units BID, Novolog 6units TID with meals Add SSI Synthroid  NEUROLOGIC A:  Hx CVA   P:  Dysphagia diet  Code Status: DNR. Does not want CPR, mechanical ventilation, or hemodialysis.  FAMILY  - Updates: husband updated bedside  - Inter-disciplinary family meet or Palliative Care meeting due by: day 7  Garry Heater, DO Family Medicine, PGY-2  04/07/2015, 7:16 AM   STAFF NOTE: I, Rory Percy, MD FACP have personally  reviewed patient's available data, including medical history, events of note, physical examination and test results as part of my evaluation. I have discussed with resident/NP and other care providers such as pharmacist, RN and RRT. In addition, I personally evaluated patient and elicited key findings of: IMproved,  No distress, atn crt noted, no pressors, LA clearing, gram neg rod noted, dc vanc, keep zosyn, narrow in am , CT if declines, Urine culture sent, to [facility]  Mcarthur Rossetti. Tyson Alias, MD, FACP Pgr: [phone] [facility] 04/07/2015 11:50 AM

## 2015-04-07 NOTE — Progress Notes (Signed)
Orthopedic Tech Progress Note Patient Details:  Kimberly [name] November 26, 1955 [id number]  Ortho Devices Type of Ortho Device: Postop shoe/boot Ortho Device/Splint Location: RLE Prafo Ortho Device/Splint Interventions: Application   Asia R Thompson 04/07/2015, 12:41 AM

## 2015-04-07 NOTE — Consult Note (Addendum)
WOC wound consult note Reason for Consult: Consult requested for bilat heels.  Dr Lajoyce Corners of the ortho service performed a consult earlier and requested that Profore be applied to BLE. Pt has been followed in the past by the outpatient wound care center and previously had a wound to the right heel which healed.  She noticed pain and drainage from right heel this week and a new wound has occurred at the previous site. Wound type: Left heel with dark purple deep tissue injury; .5X.5cm, no open wound or drainage Right heel with deep tissue injury; 4X5cm, no open wound or drainage, intact skin over dark purple fluid-filled blister. Generalized edema and dark red skin to bilat legs, skin beginning to dry and peel where previous cellulitis was located on calves. Dressing procedure/placement/frequency: Applied bilat Profore 4 layer compression wraps and will plan to change on Monday if patient is still in the hospital at that time.  Float heels to reduce pressure. Pt will need home health upon discharge for twice weekly application of Profore, and can follow-up with the outpatient wound care center. Please re-consult if further assistance is needed.  Thank-you,  Cammie Mcgee MSN, RN, CWOCN, [place], CNS [phone]

## 2015-04-07 NOTE — Progress Notes (Signed)
ANTIBIOTIC CONSULT NOTE - INITIAL  Pharmacy Consult for Vancomycin  Indication: cellulitis  Allergies  Allergen Reactions  . Epinephrine     Increased heart rate    Patient Measurements: Height: 5\' 4"  (162.6 cm) Weight: (!) 323 lb (146.512 kg) IBW/kg (Calculated) : 54.7 Adjusted Body Weight: 90 kg  Vital Signs: Temp: 98.6 F (37 C) (07/27 0000) Temp Source: Oral (07/27 0000) BP: 153/56 mmHg (07/26 2200) Pulse Rate: 92 (07/26 2200) Intake/Output from previous day:   Intake/Output from this shift:    Labs:  Recent Labs  04/06/15 1413  WBC 42.6*  HGB 10.1*  PLT 221  CREATININE 3.29*   Estimated Creatinine Clearance: 26.2 mL/min (by C-G formula based on Cr of 3.29). No results for input(s): VANCOTROUGH, VANCOPEAK, VANCORANDOM, GENTTROUGH, GENTPEAK, GENTRANDOM, TOBRATROUGH, TOBRAPEAK, TOBRARND, AMIKACINPEAK, AMIKACINTROU, AMIKACIN in the last 72 hours.   Microbiology: No results found for this or any previous visit (from the past 720 hour(s)).  Medical History: Past Medical History  Diagnosis Date  . HYPERLIPIDEMIA   . HYPERTENSION   . CAD, NATIVE VESSEL     May 10, 2010 cath showed a hyperdynamic LV function, she had dominant circumflex anatomy with a 70-80% small OM1. She had diffuse diabetic plaque particularly in the distal LAD. She nondominant RCA.  Nondominant  . PVD     CEA  . COPD   . Edema   . CHF (congestive heart failure)     Preserved EF  . Cellulitis 10/15/2013    BILATERAL  . Diabetic neuropathy   . Anasarca 05/2014  . Type II diabetes mellitus   . Proliferative retinopathy     hx/notes 01/27/2010  . Peripheral neuropathy     hx/notes 01/27/2010  . Complication of anesthesia     "they had trouble reviving me after the latest carotid OR" (04/06/2015)  . Heart attack     "on the table when I had my last carotid OR" (04/06/2015)  . Asthma   . On home oxygen therapy     "2.5L; 24/7" (04/06/2015)  . Pneumonia "several times"  . Chronic  bronchitis     "often; usually q yr" (04/06/2015)  . Hypothyroidism   . History of hiatal hernia   . GERD (gastroesophageal reflux disease)   . 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 Arthritis     "aches and pains all over" (04/06/2015)  . Anxiety   . Depression   . Chronic kidney disease     "they've had me on dialysis periodically" (04/06/2015)  . Paralyzed vocal cords     Medications:  Prescriptions prior to admission  Medication Sig Dispense Refill Last Dose  . acetaminophen (TYLENOL) 325 MG tablet Take 650 mg by mouth every 6 (six) hours as needed for mild pain.   Past Month at Unknown time  . albuterol (PROVENTIL HFA;VENTOLIN HFA) 108 (90 BASE) MCG/ACT inhaler Inhale 1-2 puffs into the lungs every 6 (six) hours as needed for wheezing or shortness of breath.   Past Month at Unknown time  . amLODipine (NORVASC) 5 MG tablet TAKE 1 TABLET BY MOUTH EVERY DAY 30 tablet 0 04/05/2015 at Unknown time  . aspirin EC 81 MG tablet Take 81 mg by mouth daily.   04/05/2015 at Unknown time  . insulin aspart (NOVOLOG) 100 UNIT/ML injection Inject 6 Units into the skin 3 (three) times daily with  meals.   04/06/2015 at Unknown time  . insulin glargine (LANTUS) 100 UNIT/ML injection Inject 60 Units into the skin 2 (two) times daily.    04/06/2015 at Unknown time  . levothyroxine (SYNTHROID, LEVOTHROID) 75 MCG tablet Take 75 mcg by mouth daily before breakfast.   04/05/2015 at Unknown time  . metolazone (ZAROXOLYN) 5 MG tablet TAKE 1 TABLET BY MOUTH TWICE A WEEK (MONDAY AND FRIDAY) 30 tablet 3 Past Week at Unknown time  . metoprolol succinate (TOPROL-XL) 50 MG 24 hr tablet Take 1 and 1/2 tabs daily = 75 mg daily (Patient taking differently: 75 mg. Take 1 and 1/2 tabs daily = 75 mg daily) 45 tablet 11 04/05/2015 at Unknown time  . Multiple Vitamins-Minerals (MULTIVITAMIN PO) Take 1 tablet by mouth  daily.   04/05/2015 at Unknown time  . potassium chloride SA (KLOR-CON M20) 20 MEQ tablet Take 2 tablets (40 mEq total) by mouth 2 (two) times daily. 360 tablet 3 04/05/2015 at Unknown time  . torsemide (DEMADEX) 20 MG tablet Take 3 tablets by mouth twice a day, Take an extra 20 mg if weight increase 3 lbs in one day 190 tablet 3 04/05/2015 at Unknown time   Assessment: 59 yo female with B LE cellulitis for empiric antibiotics.  Vancomycin 1 g IV given in ED at  1500  Goal of Therapy:  Vancomycin trough level 10-15 mcg/ml  Plan:  Vancomycin 1500 mg IV now, then vancomycin 1500 mg IV q48h  Shyah Cadmus, Gary Fleet 04/07/2015,12:32 AM

## 2015-04-07 NOTE — Progress Notes (Signed)
In to assist patient off bedpan.  After assisting with bed mobility, patient complained of 5/10 L sided chest pain, non-radiating.  Pt given 2 SL NTG without relief of pain.  EKG obtained and consistent with EKG from 7/26.  Pt requested soda to attempt to belch; after belching patient reported resolution of chest pain.  MD notified.    Delsin Copen, [facility]

## 2015-04-08 DIAGNOSIS — I10 Essential (primary) hypertension: Secondary | ICD-10-CM

## 2015-04-08 DIAGNOSIS — N189 Chronic kidney disease, unspecified: Secondary | ICD-10-CM

## 2015-04-08 DIAGNOSIS — IMO0001 Reserved for inherently not codable concepts without codable children: Secondary | ICD-10-CM | POA: Insufficient documentation

## 2015-04-08 DIAGNOSIS — N390 Urinary tract infection, site not specified: Secondary | ICD-10-CM | POA: Diagnosis present

## 2015-04-08 DIAGNOSIS — N3000 Acute cystitis without hematuria: Secondary | ICD-10-CM

## 2015-04-08 DIAGNOSIS — E1122 Type 2 diabetes mellitus with diabetic chronic kidney disease: Secondary | ICD-10-CM

## 2015-04-08 LAB — BASIC METABOLIC PANEL
ANION GAP: 9 (ref 5–15)
BUN: 79 mg/dL — AB (ref 6–20)
CHLORIDE: 99 mmol/L — AB (ref 101–111)
CO2: 28 mmol/L (ref 22–32)
Calcium: 8.8 mg/dL — ABNORMAL LOW (ref 8.9–10.3)
Creatinine, Ser: 3.19 mg/dL — ABNORMAL HIGH (ref 0.44–1.00)
GFR, EST AFRICAN AMERICAN: 17 mL/min — AB (ref >60)
GFR, EST NON AFRICAN AMERICAN: 15 mL/min — AB (ref >60)
Glucose, Bld: 95 mg/dL (ref 65–99)
Potassium: 4.3 mmol/L (ref 3.5–5.1)
Sodium: 136 mmol/L (ref 135–145)

## 2015-04-08 LAB — GLUCOSE, CAPILLARY
Glucose-Capillary: 86 mg/dL (ref 65–99)
Glucose-Capillary: 126 mg/dL — ABNORMAL HIGH (ref 65–99)
Glucose-Capillary: 143 mg/dL — ABNORMAL HIGH (ref 65–99)
Glucose-Capillary: 93 mg/dL (ref 65–99)

## 2015-04-08 LAB — CBC
HEMATOCRIT: 28.5 % — AB (ref 36.0–46.0)
HEMOGLOBIN: 9.1 g/dL — AB (ref 12.0–15.0)
MCH: 30 pg (ref 26.0–34.0)
MCHC: 31.9 g/dL (ref 30.0–36.0)
MCV: 94.1 fL (ref 78.0–100.0)
Platelets: 186 10*3/uL (ref 150–400)
RBC: 3.03 MIL/uL — ABNORMAL LOW (ref 3.87–5.11)
RDW: 16.2 % — AB (ref 11.5–15.5)
WBC: 14.6 10*3/uL — AB (ref 4.0–10.5)

## 2015-04-08 LAB — BRAIN NATRIURETIC PEPTIDE: B Natriuretic Peptide: 127 pg/mL — ABNORMAL HIGH (ref 0.0–100.0)

## 2015-04-08 MED ORDER — POTASSIUM CHLORIDE 20 MEQ/15ML (10%) PO SOLN
40.0000 meq | Freq: Two times a day (BID) | ORAL | Status: DC
  Administered 2015-04-08 (×2): 40 meq via ORAL
  Filled 2015-04-08 (×2): qty 30

## 2015-04-08 MED ORDER — GI COCKTAIL ~~LOC~~: 30.0000 mL | Freq: Three times a day (TID) | ORAL | Status: DC | PRN

## 2015-04-08 MED ORDER — PANTOPRAZOLE SODIUM 40 MG PO TBEC
40.0000 mg | DELAYED_RELEASE_TABLET | Freq: Every day | ORAL | Status: DC
  Administered 2015-04-08 – 2015-04-20 (×13): 40 mg via ORAL
  Filled 2015-04-08 (×13): qty 1

## 2015-04-08 NOTE — Progress Notes (Signed)
[facility] PROGRESS NOTE  Kimberly [name] WUJ:[id number] DOB: [birth year] DOA: 04/06/2015 PCP: [name], MD  Assessment/Plan: #1 severe sepsis secondary to bilateral heel wounds/and lower extremity cellulitis and urinary tract infection.  Patient currently afebrile. Blood pressure has improved. Urine cultures pending. Patient with clinical improvement. Continue empiric IV Zosyn. Profore  compression wraps to bilateral lower extremities. heel floaters. Patient's been seen in consultation by orthopedics who recommended IV antibiotics, compression wraps, outpatient follow-up. Patient will follow-up with the wound care center on discharge. Follow.  #2 urinary tract infection Urine cultures pending. Continue IV Zosyn.  #3 bilateral heel wounds/lower extremity cellulitis See problem #1.  #4 probable acute CHF exacerbation Patient on admission was hypotensive and as such could not be diuresed. Patient's blood pressure responded to fluids. Patient currently stable denies any shortness of breath. Will monitor closely.patient seems compensated on examination. Continue to hold diuretics for now. We'll resume Demadex in the next 1-2 days. Follow.  #5 history of CVA Stable.  #6 hypothyroidism Continue home dose Synthroid.  #7 diabetes mellitus   hemoglobin A1c was 7.0 on 06/09/2014. Repeat a hemoglobin A1c. CBGs have ranged from 86-143. Continue current regimen of Lantus 55 units twice daily. Sliding scale insulin.  #8 hypertension Stable. Continue Norvasc and metoprolol.  #9 morbid obesity  #10 acute on chronic chronic kidney disease stage III May be secondary to ATN secondary to sepsis. Baseline creatinine about 2.0. Once blood pressure improves may need to resume home regimen Demadex.  #11 prophylaxis Heparin for DVT prophylaxis.  Code Status: DO NOT RESUSCITATE Family Communication: Updated patient at bedside. No family present. Disposition Plan: When medically  stable.   Consultants:  Orthopedics: Dr. Lajoyce Corners 04/06/2015  Procedures:  Chest x-ray 04/06/2015  Antibiotics:  IV Zosyn 04/06/2015  IV vancomycin 04/06/2015>>>> 04/07/2015  HPI/Subjective: Patient states she's feeling better than on admission. Patient denies any shortness of breath. Patient denies any chest pain.  Objective: Filed Vitals:   04/08/15 1444  BP: 127/46  Pulse: 80  Temp: 98.4 F (36.9 C)  Resp: 20    Intake/Output Summary (Last 24 hours) at 04/08/15 1758 Last data filed at 04/08/15 1414  Gross per 24 hour  Intake    510 ml  Output    600 ml  Net    -90 ml   Filed Weights   04/06/15 1257 04/07/15 1750  Weight: 146.512 kg (323 lb) 131.997 kg (291 lb)    Exam:   General:  NAD  Cardiovascular: RRR  Respiratory: CTAB  Abdomen: Soft, nontender, nondistended, positive bowel sounds.  Musculoskeletal: No clubbing or cyanosis. Bilateral lower extremities wrapped.  Data Reviewed: Basic Metabolic Panel:  Recent Labs Lab 04/06/15 1413 04/07/15 0250 04/08/15 0526  NA 132* 133* 136  K 3.7 3.8 4.3  CL 92* 97* 99*  CO2 26 27 28   GLUCOSE 328* 372* 95  BUN 84* 81* 79*  CREATININE 3.29* 3.00* 3.19*  CALCIUM 8.8* 8.6* 8.8*  MG  --  1.9  --   PHOS  --  4.0  --    Liver Function Tests:  Recent Labs Lab 04/06/15 1413  AST 27  ALT 13*  ALKPHOS 96  BILITOT 1.6*  PROT 8.0  ALBUMIN 3.2*   No results for input(s): LIPASE, AMYLASE in the last 168 hours. No results for input(s): AMMONIA in the last 168 hours. CBC:  Recent Labs Lab 04/06/15 1413 04/07/15 0250 04/08/15 0526  WBC 42.6* 22.1* 14.6*  NEUTROABS 41.3*  --   --  HGB 10.1* 9.5* 9.1*  HCT 31.0* 29.2* 28.5*  MCV 91.4 92.1 94.1  PLT 221 174 186   Cardiac Enzymes:  Recent Labs Lab 04/06/15 1413  TROPONINI 0.06*   BNP (last 3 results)  Recent Labs  04/06/15 1413 04/08/15 0526  BNP 510.5* 127.0*    ProBNP (last 3 results)  Recent Labs  06/09/14 1818  PROBNP  4358.0*    CBG:  Recent Labs Lab 04/07/15 1829 04/07/15 2212 04/08/15 0806 04/08/15 1143 04/08/15 1658  GLUCAP 208* 159* 86 126* 143*    Recent Results (from the past 240 hour(s))  Culture, blood (routine x 2)     Status: None (Preliminary result)   Collection Time: 04/06/15  2:13 PM  Result Value Ref Range Status   Specimen Description BLOOD ARM RIGHT  Final   Special Requests BOTTLES DRAWN AEROBIC AND ANAEROBIC 5CC  Final   Culture  Setup Time   Final    GRAM NEGATIVE RODS AEROBIC BOTTLE ONLY CRITICAL RESULT CALLED TO, READ BACK BY AND VERIFIED WITH: Burnard Hawthorne, RN AT 1133 ON [id number] BY Lucienne Capers    Culture GRAM NEGATIVE RODS  Final   Report Status PENDING  Incomplete  Culture, Urine     Status: None (Preliminary result)   Collection Time: 04/06/15  2:52 PM  Result Value Ref Range Status   Specimen Description URINE, RANDOM  Final   Special Requests ADDED [id number]  Final   Culture CULTURE REINCUBATED FOR BETTER GROWTH  Final   Report Status PENDING  Incomplete  Culture, blood (routine x 2)     Status: None (Preliminary result)   Collection Time: 04/06/15  3:00 PM  Result Value Ref Range Status   Specimen Description BLOOD LEFT HAND  Final   Special Requests BOTTLES DRAWN AEROBIC AND ANAEROBIC  Final   Culture NO GROWTH 2 DAYS  Final   Report Status PENDING  Incomplete  MRSA PCR Screening     Status: Abnormal   Collection Time: 04/06/15 10:17 PM  Result Value Ref Range Status   MRSA by PCR POSITIVE (A) NEGATIVE Final    Comment:        The GeneXpert MRSA Assay (FDA approved for NASAL specimens only), is one component of a comprehensive MRSA colonization surveillance program. It is not intended to diagnose MRSA infection nor to guide or monitor treatment for MRSA infections. RESULT CALLED TO, READ BACK BY AND VERIFIED WITH: Tyler Aas RN [id number] 04/07/15 MITCHELL,L      Studies: No results found.  Scheduled Meds: . amLODipine  5 mg Oral Daily  .  aspirin EC  81 mg Oral Daily  . Chlorhexidine Gluconate Cloth  6 each Topical Q0600  . heparin  5,000 Units Subcutaneous 3 times per day  . insulin aspart  2-6 Units Subcutaneous TID WC  . insulin aspart  6 Units Subcutaneous TID WC  . insulin glargine  55 Units Subcutaneous BID  . levothyroxine  75 mcg Oral QAC breakfast  . metoprolol succinate  25 mg Oral Daily  . mupirocin ointment  1 application Nasal BID  . pantoprazole  40 mg Oral Q0600  . piperacillin-tazobactam (ZOSYN)  IV  3.375 g Intravenous 3 times per day  . potassium chloride  40 mEq Oral BID   Continuous Infusions: . sodium chloride 10 mL/hr at 04/08/15 0750    Principal Problem:   Severe sepsis Active Problems:   DM (diabetes mellitus), type 2 with renal complications   Essential hypertension   Peripheral  vascular disease   CKD (chronic kidney disease), stage III   On home oxygen therapy   Hypothyroidism   Morbid obesity   Pressure ulcer   Acute exacerbation of CHF (congestive heart failure)   Diabetic foot infection   UTI (urinary tract infection)   Blood poisoning    Time spent: 35 mins    [facility] MD [facility] Pager [phone]. If 7PM-7AM, please contact night-coverage at [email or web address], password [facility] 04/08/2015, 5:58 PM  LOS: 2 days

## 2015-04-09 DIAGNOSIS — N183 Chronic kidney disease, stage 3 (moderate): Secondary | ICD-10-CM

## 2015-04-09 LAB — URINE CULTURE

## 2015-04-09 LAB — BASIC METABOLIC PANEL
Anion gap: 9 (ref 5–15)
BUN: 82 mg/dL — ABNORMAL HIGH (ref 6–20)
CO2: 27 mmol/L (ref 22–32)
CREATININE: 3.47 mg/dL — AB (ref 0.44–1.00)
Calcium: 8.7 mg/dL — ABNORMAL LOW (ref 8.9–10.3)
Chloride: 103 mmol/L (ref 101–111)
GFR calc Af Amer: 15 mL/min — ABNORMAL LOW (ref >60)
GFR, EST NON AFRICAN AMERICAN: 13 mL/min — AB (ref >60)
GLUCOSE: 158 mg/dL — AB (ref 65–99)
POTASSIUM: 5.2 mmol/L — AB (ref 3.5–5.1)
Sodium: 139 mmol/L (ref 135–145)

## 2015-04-09 LAB — CBC
HEMATOCRIT: 29.2 % — AB (ref 36.0–46.0)
HEMOGLOBIN: 9.1 g/dL — AB (ref 12.0–15.0)
MCH: 29.8 pg (ref 26.0–34.0)
MCHC: 31.2 g/dL (ref 30.0–36.0)
MCV: 95.7 fL (ref 78.0–100.0)
PLATELETS: 197 10*3/uL (ref 150–400)
RBC: 3.05 MIL/uL — ABNORMAL LOW (ref 3.87–5.11)
RDW: 16.2 % — AB (ref 11.5–15.5)
WBC: 11.5 10*3/uL — ABNORMAL HIGH (ref 4.0–10.5)

## 2015-04-09 LAB — GLUCOSE, CAPILLARY
GLUCOSE-CAPILLARY: 109 mg/dL — AB (ref 65–99)
Glucose-Capillary: 150 mg/dL — ABNORMAL HIGH (ref 65–99)
Glucose-Capillary: 189 mg/dL — ABNORMAL HIGH (ref 65–99)
Glucose-Capillary: 163 mg/dL — ABNORMAL HIGH (ref 65–99)

## 2015-04-09 LAB — CULTURE, BLOOD (ROUTINE X 2)

## 2015-04-09 MED ORDER — SODIUM CHLORIDE 0.9 % IV SOLN
INTRAVENOUS | Status: DC
  Administered 2015-04-09: 11:00:00 via INTRAVENOUS

## 2015-04-09 NOTE — Evaluation (Signed)
Physical Therapy Evaluation Patient Details Name: Kimberly [name] MRN: [id number] DOB: 11-May-1956 Today's Date: 04/09/2015   History of Present Illness  59 year old female with diastolic heart failure, COPD, and vascular disease came to the [facility] emergency department on 04/06/2015 complaining of right heel pain. She has been followed by a local wound care clinic for several years. However, 2 days prior to admission she started to have increasing right heel pain and redness in the leg. This was associated with fatigue, malaise, and a low-grade fever. She also noted to ED staff increasing shortness of breath over the last several days. Presently diagnosed with severe sepsis secondary to bilateral heel wounds/and lower extremity cellulitis and urinary tract infection.  Clinical Impression  Patient was able to ambulate to the doorway with supervision and use of a RW as described below. She is very knowledgeable of her limitations and need for assistance, as well as her condition and PT. She states that she does not move much at home and remains in one room for almost all of the day, otherwise needs assistance for meals and bathing. Patient will benefit from continued PT as well as home health PT on discharge to increase activity tolerance and decrease transfer assistance needed.    Follow Up Recommendations Home health PT    Equipment Recommendations  None recommended by PT (already has all necessary equipment)    Recommendations for Other Services       Precautions / Restrictions Precautions Precautions: Fall Precaution Comments: pt reported loss of sensation in B LE and that her balance is severely decreased due to complications of previous stroke Restrictions Weight Bearing Restrictions: No      Mobility  Bed Mobility Overal bed mobility: Modified Independent             General bed mobility comments: Very slow and effortful process took about 5 mins, use of bed rails and line  management  Transfers Overall transfer level: Needs assistance Equipment used: Rolling walker (2 wheeled) Transfers: Sit to/from Stand Sit to Stand: +2 physical assistance;From elevated surface;Mod assist;Supervision         General transfer comment: Was able to stand with supervision from chair using arm rests, needed 2 attempts and some momentum to stand up from the bed  Ambulation/Gait Ambulation/Gait assistance: +2 safety/equipment;Supervision Ambulation Distance (Feet): 10 Feet Assistive device: Rolling walker (2 wheeled) Gait Pattern/deviations: Decreased step length - left;Wide base of support;Shuffle;Decreased stride length (left hip circumduction to clear L foot)   Gait velocity interpretation: <1.8 ft/sec, indicative of risk for recurrent falls General Gait Details: Slow and effortful, after first few steps pt required a rest break but then was able to walk to the doorway in one bout  Stairs            Wheelchair Mobility    Modified Rankin (Stroke Patients Only)       Balance Overall balance assessment: Needs assistance;History of Falls Sitting-balance support: Bilateral upper extremity supported;Feet unsupported Sitting balance-Leahy Scale: Poor       Standing balance-Leahy Scale: Poor                               Pertinent Vitals/Pain Pain Assessment: No/denies pain    Home Living Family/patient expects to be discharged to:: Private residence Living Arrangements: Spouse/significant other Available Help at Discharge: Family;Available PRN/intermittently Type of Home: House Home Access: Ramped entrance     Home Layout: Multi-level (older home  w/multiple levels to enter most rooms) Home Equipment: Art gallery manager;Wheelchair - [employer] - 2 wheels;Walker - 4 wheels;Grab bars - toilet;Grab bars - tub/shower;Shower seat (mostly uses scooter and 2 wheel walker for mobility) Additional Comments: pt has worked with HHPT before but  reports decreased ability to ambulate recently    Prior Function Level of Independence: Needs assistance   Gait / Transfers Assistance Needed: pt reports being unable to ambulate currently and needs assistance with transfers  ADL's / Homemaking Assistance Needed: husband assists with all tasks        Hand Dominance   Dominant Hand: Right (also due to L UE weakness from previous stroke)    Extremity/Trunk Assessment               Lower Extremity Assessment: Generalized weakness         Communication   Communication: No difficulties  Cognition Arousal/Alertness: Awake/alert Behavior During Therapy: WFL for tasks assessed/performed Overall Cognitive Status: Within Functional Limits for tasks assessed                      General Comments      Exercises        Assessment/Plan    PT Assessment Patient needs continued PT services  PT Diagnosis Difficulty walking;Abnormality of gait;Generalized weakness;Acute pain   PT Problem List Decreased strength;Decreased range of motion;Decreased activity tolerance;Decreased balance;Decreased mobility;Impaired sensation;Obesity;Decreased skin integrity  PT Treatment Interventions Gait training;Functional mobility training;Therapeutic activities;Therapeutic exercise;Balance training;Neuromuscular re-education;Modalities   PT Goals (Current goals can be found in the Care Plan section) Acute Rehab PT Goals Patient Stated Goal: return home PT Goal Formulation: With patient Time For Goal Achievement: 04/23/15 Potential to Achieve Goals: Good    Frequency Min 3X/week   Barriers to discharge        Co-evaluation               End of Session Equipment Utilized During Treatment: Gait belt Activity Tolerance: No increased pain;Patient limited by fatigue;Patient tolerated treatment well Patient left: in chair;with call bell/phone within reach (heels hanging off recliner for decreased heel pressure) Nurse  Communication: Mobility status         Time: 1035-1130 (pt very chatty, no charge for this time) PT Time Calculation (min) (ACUTE ONLY): 55 min   Charges:   PT Evaluation $Initial PT Evaluation Tier I: 1 Procedure PT Treatments $Gait Training: 8-22 mins   PT G CodesMichele Rockers, SPT [phone]  04/09/2015, 1:24 PM

## 2015-04-09 NOTE — Progress Notes (Signed)
RN entered pt room to give hs meds. Pt told RN she felt blood sugar was low. Pt had already eaten peanut butter, graham crackers, and skim milk. RN checked CBG which resulted as 120. RN notified NP-orders given to administer Lantus 55 units. Pt agreed with NP decision. Will continue to monitor.

## 2015-04-09 NOTE — Care Management Note (Addendum)
Case Management Note  Patient Details  Name: Kimberly [name] MRN: [id number] Date of Birth: 1955/10/30  Subjective/Objective:                 Patient from home with husband. Has oxygen at home, on 2L prior to admission. Patient states that she has walker at home and has had [facility] PT several times in the past and declined getting after discharge stating that she knows what to do. She feels she may need a nurse after discharge or to follow up at the wound clinic. She stated that if she had a HH RN she would like to use [facility].    Action/Plan:  Will continue to follow.  Expected Discharge Date:                  Expected Discharge Plan:  Home w Home Health Services  In-House Referral:     Discharge planning Services  CM Consult  Post Acute Care Choice:  NA Choice offered to:  Patient  DME Arranged:    DME Agency:     HH Arranged:    HH Agency:  [facility]  Status of Service:  In process, will continue to follow  Medicare Important Message Given:    Date Medicare IM Given:    Medicare IM give by:    Date Additional Medicare IM Given:    Additional Medicare Important Message give by:     If discussed at Long Length of Stay Meetings, dates discussed:    Additional Comments:  Lawerance Sabal, RN 04/09/2015, 1:44 PM

## 2015-04-09 NOTE — Progress Notes (Signed)
[facility] PROGRESS NOTE  Kimberly [name] ZOX:[id number] DOB: 04-Mar-1956 DOA: 04/06/2015 PCP: [name], MD  Assessment/Plan: #1 severe sepsis secondary to bilateral heel wounds/and lower extremity cellulitis and urinary tract infection.  Patient currently afebrile. Blood pressure has improved. Urine cultures pending. Blood cultures 1 out of 2 with Pseudomonas likely a contaminant. Patient with clinical improvement. Continue empiric IV Zosyn. Profore  compression wraps to bilateral lower extremities. heel floaters. Patient's been seen in consultation by orthopedics who recommended IV antibiotics, compression wraps, outpatient follow-up. Patient will follow-up with the wound care center on discharge. Follow.  #2 urinary tract infection Urine cultures pending. Continue IV Zosyn.  #3 bilateral heel wounds/lower extremity cellulitis See problem #1.  #4 probable acute CHF exacerbation Patient on admission was hypotensive and as such could not be diuresed. Patient's blood pressure responded to fluids. Patient currently stable denies any shortness of breath. Will monitor closely.patient seems compensated on examination. Continue to hold diuretics for now. Follow.  #5 history of CVA Stable.  #6 hypothyroidism Continue home dose Synthroid.  #7 diabetes mellitus   hemoglobin A1c was 7.0 on 06/09/2014. Repeat a hemoglobin A1c. CBGs have ranged from 150-189. Continue current regimen of Lantus 55 units twice daily. Sliding scale insulin.  #8 hypertension Stable. Continue Norvasc and metoprolol.  #9 morbid obesity  #10 acute on chronic chronic kidney disease stage III May be secondary to ATN secondary to sepsis. Baseline creatinine about 2.0. Creatinine slightly creeping up. Will continue to hold diuretics. Gentle hydration.   #11 prophylaxis Heparin for DVT prophylaxis.  Code Status: DO NOT RESUSCITATE Family Communication: Updated patient at bedside. No family  present. Disposition Plan: When medically stable, probably home with home health.   Consultants:  Orthopedics: Dr. Lajoyce Corners 04/06/2015  Procedures:  Chest x-ray 04/06/2015  Antibiotics:  IV Zosyn 04/06/2015>>>>>   IV vancomycin 04/06/2015>>>> 04/07/2015  HPI/Subjective: Patient sitting up in chair. Patient states she's feeling better. No chest pain. No shortness of breath.  Objective: Filed Vitals:   04/09/15 0617  BP: 135/50  Pulse: 78  Temp: 98.1 F (36.7 C)  Resp: 18    Intake/Output Summary (Last 24 hours) at 04/09/15 1238 Last data filed at 04/09/15 1011  Gross per 24 hour  Intake   1104 ml  Output    400 ml  Net    704 ml   Filed Weights   04/06/15 1257 04/07/15 1750 04/09/15 0617  Weight: 146.512 kg (323 lb) 131.997 kg (291 lb) 132 kg (291 lb 0.1 oz)    Exam:   General:  NAD  Cardiovascular: RRR  Respiratory: CTAB  Abdomen: Soft, nontender, nondistended, positive bowel sounds.  Musculoskeletal: No clubbing or cyanosis. Bilateral lower extremities wrapped.  Data Reviewed: Basic Metabolic Panel:  Recent Labs Lab 04/06/15 1413 04/07/15 0250 04/08/15 0526 04/09/15 0550  NA 132* 133* 136 139  K 3.7 3.8 4.3 5.2*  CL 92* 97* 99* 103  CO2 GLUCOSE 328* 372* 95 158*  BUN 84* 81* 79* 82*  CREATININE 3.29* 3.00* 3.19* 3.47*  CALCIUM 8.8* 8.6* 8.8* 8.7*  MG  --  1.9  --   --   PHOS  --  4.0  --   --    Liver Function Tests:  Recent Labs Lab 04/06/15 1413  AST 27  ALT 13*  ALKPHOS 96  BILITOT 1.6*  PROT 8.0  ALBUMIN 3.2*   No results for input(s): LIPASE, AMYLASE in the last 168 hours. No results for input(s): AMMONIA  in the last 168 hours. CBC:  Recent Labs Lab 04/06/15 1413 04/07/15 0250 04/08/15 0526 04/09/15 0550  WBC 42.6* 22.1* 14.6* 11.5*  NEUTROABS 41.3*  --   --   --   HGB 10.1* 9.5* 9.1* 9.1*  HCT 31.0* 29.2* 28.5* 29.2*  MCV 91.4 92.1 94.1 95.7  PLT 221 174 186 197   Cardiac Enzymes:  Recent  Labs Lab 04/06/15 1413  TROPONINI 0.06*   BNP (last 3 results)  Recent Labs  04/06/15 1413 04/08/15 0526  BNP 510.5* 127.0*    ProBNP (last 3 results)  Recent Labs  06/09/14 1818  PROBNP 4358.0*    CBG:  Recent Labs Lab 04/08/15 0806 04/08/15 1143 04/08/15 1658 04/08/15 2213 04/09/15 0805  GLUCAP 86 126* 143* 93 150*    Recent Results (from the past 240 hour(s))  Culture, blood (routine x 2)     Status: None   Collection Time: 04/06/15  2:13 PM  Result Value Ref Range Status   Specimen Description BLOOD ARM RIGHT  Final   Special Requests BOTTLES DRAWN AEROBIC AND ANAEROBIC 5CC  Final   Culture  Setup Time   Final    GRAM NEGATIVE RODS AEROBIC BOTTLE ONLY CRITICAL RESULT CALLED TO, READ BACK BY AND VERIFIED WITH: Burnard Hawthorne, RN AT 1133 ON [id number] BY Lucienne Capers    Culture PSEUDOMONAS AERUGINOSA  Final   Report Status 04/09/2015 FINAL  Final   Organism ID, Bacteria PSEUDOMONAS AERUGINOSA  Final      Susceptibility   Pseudomonas aeruginosa - MIC*    CEFTAZIDIME 2 SENSITIVE Sensitive     CIPROFLOXACIN <=0.25 SENSITIVE Sensitive     GENTAMICIN <=1 SENSITIVE Sensitive     IMIPENEM 1 SENSITIVE Sensitive     PIP/TAZO <=4 SENSITIVE Sensitive     CEFEPIME 2 SENSITIVE Sensitive     * PSEUDOMONAS AERUGINOSA  Culture, Urine     Status: None   Collection Time: 04/06/15  2:52 PM  Result Value Ref Range Status   Specimen Description URINE, RANDOM  Final   Special Requests ADDED [id number]  Final   Culture MULTIPLE SPECIES PRESENT, SUGGEST RECOLLECTION  Final   Report Status 04/09/2015 FINAL  Final  Culture, blood (routine x 2)     Status: None (Preliminary result)   Collection Time: 04/06/15  3:00 PM  Result Value Ref Range Status   Specimen Description BLOOD LEFT HAND  Final   Special Requests BOTTLES DRAWN AEROBIC AND ANAEROBIC  Final   Culture NO GROWTH 2 DAYS  Final   Report Status PENDING  Incomplete  MRSA PCR Screening     Status: Abnormal    Collection Time: 04/06/15 10:17 PM  Result Value Ref Range Status   MRSA by PCR POSITIVE (A) NEGATIVE Final    Comment:        The GeneXpert MRSA Assay (FDA approved for NASAL specimens only), is one component of a comprehensive MRSA colonization surveillance program. It is not intended to diagnose MRSA infection nor to guide or monitor treatment for MRSA infections. RESULT CALLED TO, READ BACK BY AND VERIFIED WITH: Tyler Aas RN 8119 04/07/15 MITCHELL,L      Studies: No results found.  Scheduled Meds: . amLODipine  5 mg Oral Daily  . aspirin EC  81 mg Oral Daily  . Chlorhexidine Gluconate Cloth  6 each Topical Q0600  . heparin  5,000 Units Subcutaneous 3 times per day  . insulin aspart  2-6 Units Subcutaneous TID WC  . insulin aspart  6 Units Subcutaneous TID WC  . insulin glargine  55 Units Subcutaneous BID  . levothyroxine  75 mcg Oral QAC breakfast  . metoprolol succinate  25 mg Oral Daily  . mupirocin ointment  1 application Nasal BID  . pantoprazole  40 mg Oral Q0600  . piperacillin-tazobactam (ZOSYN)  IV  3.375 g Intravenous 3 times per day   Continuous Infusions: . sodium chloride 10 mL/hr at 04/08/15 0750  . sodium chloride 50 mL/hr at 04/09/15 1052    Principal Problem:   Severe sepsis Active Problems:   DM (diabetes mellitus), type 2 with renal complications   Essential hypertension   Peripheral vascular disease   CKD (chronic kidney disease), stage III   On home oxygen therapy   Hypothyroidism   Morbid obesity   Pressure ulcer   Acute exacerbation of CHF (congestive heart failure)   Diabetic foot infection   UTI (urinary tract infection)   Blood poisoning    Time spent: 35 mins    [facility] MD [facility] Pager [phone]. If 7PM-7AM, please contact night-coverage at [email or web address], password [facility] 04/09/2015, 12:38 PM  LOS: 3 days

## 2015-04-09 NOTE — Progress Notes (Signed)
Occupational Therapy Evaluation Patient Details Name: Kimberly [name] MRN: [id number] DOB: [birth year] Today's Date: 04/09/2015    History of Present Illness 59 year old female with diastolic heart failure, COPD, and vascular disease came to the [facility] emergency department on 04/06/2015 complaining of right heel pain. She has been followed by a local wound care clinic for several years. However, 2 days prior to admission she started to have increasing right heel pain and redness in the leg. This was associated with fatigue, malaise, and a low-grade fever. She also noted to ED staff increasing shortness of breath over the last several days. Presently diagnosed with severe sepsis secondary to bilateral heel wounds/and lower extremity cellulitis and urinary tract infection.   Clinical Impression   Patient presenting with deconditioning. Patient mod I with transfers, independent>supervision with grooming and UB ADLs, and min>max assist with other ADLs PTA. Patient currently requires supervision>min assist with functional transfers and min assist with grooming tasks and UB ADLs. Patient will benefit from acute OT to increase overall independence in the areas of ADLs, functional mobility, and overall safety in order to safely discharge home.     Follow Up Recommendations  No OT follow up;Supervision/Assistance - 24 hour    Equipment Recommendations  None recommended by OT    Recommendations for Other Services  None at this time   Precautions / Restrictions Precautions Precautions: Fall Precaution Comments: pt reported loss of sensation in B LE and that her balance is severely decreased due to complications of previous stroke Restrictions Weight Bearing Restrictions: No    Mobility Bed Mobility Overal bed mobility: Needs Assistance Bed Mobility: Sit to Supine       Sit to supine: Mod assist   General bed mobility comments: Assistance needed for management of BLEs.   Transfers Overall  transfer level: Needs assistance Equipment used: Rolling walker (2 wheeled) Transfers: Sit to/from Stand Sit to Stand: Min assist;From elevated surface;+2 safety/equipment         General transfer comment: Use of gait belt to assist. +2 present for safety/equipment. Pt performed sit<>stand from [facility] and sit<>stand from elevated EOB.    Balance Overall balance assessment: Needs assistance Sitting-balance support: No upper extremity supported;Feet supported Sitting balance-Leahy Scale: Fair     Standing balance support: Bilateral upper extremity supported;During functional activity Standing balance-Leahy Scale: Fair    ADL Overall ADL's : Needs assistance/impaired General ADL Comments: Pt overall at baseline for ADLs, however requiring min assist for UB ADLs and supervision for grooming tasks (she was independent with these tasks PTA). Pt does require min guard for functional transfers(she was mod I for this PTA). Will follow patient acutely to achieve mod I BSC transfer so she can safely discharge home. Her husband assist before he goes to work, comes home for lunch, then assists in the evenings. Pt reports she stays in the recliner all day, transferring <> BSC prn.     Pertinent Vitals/Pain Pain Assessment: No/denies pain     Hand Dominance Right   Extremity/Trunk Assessment Upper Extremity Assessment Upper Extremity Assessment: Generalized weakness   Lower Extremity Assessment Lower Extremity Assessment: Generalized weakness       Communication Communication Communication: No difficulties   Cognition Arousal/Alertness: Awake/alert Behavior During Therapy: WFL for tasks assessed/performed Overall Cognitive Status: Within Functional Limits for tasks assessed             Home Living Family/patient expects to be discharged to:: Private residence Living Arrangements: Spouse/significant other Available Help at Discharge: Family;Available PRN/intermittently Type  of Home:  House Home Access: Ramped entrance     Home Layout: Multi-level Alternate Level Stairs-Number of Steps: 2 Alternate Level Stairs-Rails: None Bathroom Shower/Tub: Tub/shower unit;Curtain         Home Equipment: Art gallery manager;Wheelchair - [employer] - 2 wheels;Walker - 4 wheels;Grab bars - toilet;Grab bars - tub/shower;Shower seat   Additional Comments: Pt has worked with HHPT before but reports decreased ability to ambulate recently. Pt also reports that she has worked with [facility] before and she feels that she is basically at baseline for ADLs, presents with decreased endurance and decreased independence with functional transfers.       Prior Functioning/Environment Level of Independence: Needs assistance  Gait / Transfers Assistance Needed: pt reports being unable to ambulate currently and needs assistance with transfers ADL's / Homemaking Assistance Needed: husband assists with all tasks Communication / Swallowing Assistance Needed: pt reports known difficulties with swallowing      OT Diagnosis: Generalized weakness   OT Problem List: Decreased strength;Decreased activity tolerance;Impaired balance (sitting and/or standing);Decreased safety awareness;Decreased knowledge of use of DME or AE;Impaired sensation   OT Treatment/Interventions: Self-care/ADL training;Therapeutic exercise;Energy conservation;DME and/or AE instruction;Therapeutic activities;Patient/family education;Balance training    OT Goals(Current goals can be found in the care plan section) Acute Rehab OT Goals Patient Stated Goal: return home OT Goal Formulation: With patient Time For Goal Achievement: 04/23/15 Potential to Achieve Goals: Good ADL Goals Pt Will Perform Grooming: with modified independence;standing Pt Will Perform Upper Body Bathing: with modified independence;sitting Pt Will Perform Upper Body Dressing: with modified independence;sitting Pt Will Transfer to Toilet: with modified  independence;bedside commode Pt/caregiver will Perform Home Exercise Program: Increased strength;Both right and left upper extremity;With theraband;With written HEP provided;With Supervision  OT Frequency: Min 2X/week   Barriers to D/C: Decreased caregiver support   End of Session Equipment Utilized During Treatment: Gait belt;Rolling walker;Oxygen Nurse Communication: Mobility status;Other (comment) (recommendation for patient to perform standpivot transfer <> BSC)  Activity Tolerance: Patient tolerated treatment well Patient left: in bed;with call bell/phone within reach   Time: 1415-1448 OT Time Calculation (min): 33 min Charges:  OT General Charges $OT Visit: 1 Procedure OT Evaluation $Initial OT Evaluation Tier I: 1 Procedure OT Treatments $Self Care/Home Management : 8-22 mins  Jolanda Mccann , MS, OTR/L, CLT Pager: [phone]  04/09/2015, 3:04 PM

## 2015-04-10 LAB — CBC WITH DIFFERENTIAL/PLATELET
Basophils Absolute: 0.1 10*3/uL (ref 0.0–0.1)
Basophils Relative: 1 % (ref 0–1)
Eosinophils Absolute: 0.3 10*3/uL (ref 0.0–0.7)
Eosinophils Relative: 3 % (ref 0–5)
HCT: 29 % — ABNORMAL LOW (ref 36.0–46.0)
HEMOGLOBIN: 9 g/dL — AB (ref 12.0–15.0)
LYMPHS PCT: 11 % — AB (ref 12–46)
Lymphs Abs: 1.1 10*3/uL (ref 0.7–4.0)
MCH: 29.1 pg (ref 26.0–34.0)
MCHC: 31 g/dL (ref 30.0–36.0)
MCV: 93.9 fL (ref 78.0–100.0)
Monocytes Absolute: 0.4 10*3/uL (ref 0.1–1.0)
Monocytes Relative: 4 % (ref 3–12)
NEUTROS ABS: 8.3 10*3/uL — AB (ref 1.7–7.7)
Neutrophils Relative %: 81 % — ABNORMAL HIGH (ref 43–77)
PLATELETS: 192 10*3/uL (ref 150–400)
RBC: 3.09 MIL/uL — ABNORMAL LOW (ref 3.87–5.11)
RDW: 16 % — ABNORMAL HIGH (ref 11.5–15.5)
WBC: 10.3 10*3/uL (ref 4.0–10.5)

## 2015-04-10 LAB — BASIC METABOLIC PANEL
Anion gap: 7 (ref 5–15)
BUN: 81 mg/dL — ABNORMAL HIGH (ref 6–20)
CALCIUM: 8.7 mg/dL — AB (ref 8.9–10.3)
CO2: 26 mmol/L (ref 22–32)
CREATININE: 3.23 mg/dL — AB (ref 0.44–1.00)
Chloride: 107 mmol/L (ref 101–111)
GFR calc Af Amer: 17 mL/min — ABNORMAL LOW (ref >60)
GFR calc non Af Amer: 15 mL/min — ABNORMAL LOW (ref >60)
GLUCOSE: 82 mg/dL (ref 65–99)
Potassium: 4.5 mmol/L (ref 3.5–5.1)
Sodium: 140 mmol/L (ref 135–145)

## 2015-04-10 LAB — GLUCOSE, CAPILLARY
GLUCOSE-CAPILLARY: 72 mg/dL (ref 65–99)
GLUCOSE-CAPILLARY: 139 mg/dL — AB (ref 65–99)
GLUCOSE-CAPILLARY: 109 mg/dL — AB (ref 65–99)
Glucose-Capillary: 120 mg/dL — ABNORMAL HIGH (ref 65–99)
Glucose-Capillary: 127 mg/dL — ABNORMAL HIGH (ref 65–99)

## 2015-04-10 LAB — CREATININE, URINE, RANDOM: Creatinine, Urine: 71.02 mg/dL

## 2015-04-10 LAB — SODIUM, URINE, RANDOM: Sodium, Ur: <10 mmol/L

## 2015-04-10 MED ORDER — CIPROFLOXACIN IN D5W 400 MG/200ML IV SOLN
400.0000 mg | INTRAVENOUS | Status: DC
  Administered 2015-04-10: 400 mg via INTRAVENOUS
  Filled 2015-04-10: qty 200

## 2015-04-10 MED ORDER — INSULIN GLARGINE 100 UNIT/ML ~~LOC~~ SOLN
50.0000 [IU] | Freq: Two times a day (BID) | SUBCUTANEOUS | Status: DC
  Administered 2015-04-10 – 2015-04-11 (×3): 50 [IU] via SUBCUTANEOUS
  Filled 2015-04-10 (×5): qty 0.5

## 2015-04-10 MED ORDER — PIPERACILLIN-TAZOBACTAM 3.375 G IVPB
3.3750 g | Freq: Three times a day (TID) | INTRAVENOUS | Status: DC
  Administered 2015-04-11 (×2): 3.375 g via INTRAVENOUS
  Filled 2015-04-10 (×4): qty 50

## 2015-04-10 MED ORDER — DIPHENHYDRAMINE HCL 25 MG PO CAPS
25.0000 mg | ORAL_CAPSULE | Freq: Three times a day (TID) | ORAL | Status: DC | PRN
Start: 2015-04-10 — End: 2015-04-20

## 2015-04-10 MED ORDER — SODIUM CHLORIDE 0.9 % IV SOLN
INTRAVENOUS | Status: DC
  Administered 2015-04-10 – 2015-04-11 (×2): via INTRAVENOUS

## 2015-04-10 NOTE — Progress Notes (Signed)
[facility] PROGRESS NOTE  Kimberly [name] ZOX:[id number] DOB: Dec 17, 1955 DOA: 04/06/2015 PCP: [name], MD  Assessment/Plan: #1 severe sepsis secondary to bilateral heel wounds/and lower extremity cellulitis and urinary tract infection.  Patient currently afebrile. Blood pressure has improved. Urine cultures pending. Blood cultures 1 out of 2 with Pseudomonas likely a contaminant. Patient with clinical improvement. Change empiric IV Zosyn to IV ciprofloxacin. Profore  compression wraps to bilateral lower extremities. heel floaters. Patient's been seen in consultation by orthopedics who recommended IV antibiotics, compression wraps, outpatient follow-up. Patient will follow-up with the wound care center on discharge. Follow.  #2 urinary tract infection Urine cultures pending. Narrow down antibiotics to IV ciprofloxacin.  #3 bilateral heel wounds/lower extremity cellulitis See problem #1.  #4 probable acute CHF exacerbation Patient on admission was hypotensive and as such could not be diuresed. Patient's blood pressure responded to fluids. Patient currently stable denies any shortness of breath. Will monitor closely.patient seems compensated on examination. Continue to hold diuretics for now. Follow.  #5 history of CVA Stable.  #6 hypothyroidism Continue home dose Synthroid.  #7 diabetes mellitus   hemoglobin A1c was 7.0 on 06/09/2014. Repeat a hemoglobin A1c. CBGs have ranged from 72-127. Decrease Lantus to 50 units twice daily. Sliding scale insulin.  #8 hypertension Stable. Continue Norvasc and metoprolol.  #9 morbid obesity  #10 acute on chronic chronic kidney disease stage III May be secondary to ATN secondary to sepsis. Baseline creatinine about 2.0. Creatinine trending back down. Urine sodium was less than 10. Gentle IV hydration times the next 24 hours.   #11 prophylaxis Heparin for DVT prophylaxis.  Code Status: DO NOT RESUSCITATE Family Communication:  Updated patient at bedside. No family present. Disposition Plan: When medically stable, probably home with home health.   Consultants:  Orthopedics: Dr. Lajoyce Corners 04/06/2015  Procedures:  Chest x-ray 04/06/2015  Antibiotics:  IV Zosyn 04/06/2015>>>>> 04/10/2015  IV vancomycin 04/06/2015>>>> 04/07/2015  IV ciprofloxacin 04/10/2015  HPI/Subjective: Patient sitting up in chair. Patient denies shortness of breath. No chest pain. Patient states she's feeling better.    Objective: Filed Vitals:   04/10/15 0541  BP: 144/61  Pulse: 80  Temp: 98 F (36.7 C)  Resp: 24    Intake/Output Summary (Last 24 hours) at 04/10/15 1355 Last data filed at 04/10/15 1247  Gross per 24 hour  Intake   1510 ml  Output   1150 ml  Net    360 ml   Filed Weights   04/07/15 1750 04/09/15 0617 04/10/15 0538  Weight: 131.997 kg (291 lb) 132 kg (291 lb 0.1 oz) 133.267 kg (293 lb 12.8 oz)    Exam:   General:  NAD  Cardiovascular: RRR  Respiratory: CTAB  Abdomen: Soft, nontender, nondistended, positive bowel sounds.  Musculoskeletal: No clubbing or cyanosis. Bilateral lower extremities wrapped.  Data Reviewed: Basic Metabolic Panel:  Recent Labs Lab 04/06/15 1413 04/07/15 0250 04/08/15 0526 04/09/15 0550 04/10/15 0520  NA 132* 133* 136 139 140  K 3.7 3.8 4.3 5.2* 4.5  CL 92* 97* 99* 103 107  CO2 GLUCOSE 328* 372* 95 158* 82  BUN 84* 81* 79* 82* 81*  CREATININE 3.29* 3.00* 3.19* 3.47* 3.23*  CALCIUM 8.8* 8.6* 8.8* 8.7* 8.7*  MG  --  1.9  --   --   --   PHOS  --  4.0  --   --   --    Liver Function Tests:  Recent Labs Lab 04/06/15 1413  AST 27  ALT 13*  ALKPHOS 96  BILITOT 1.6*  PROT 8.0  ALBUMIN 3.2*   No results for input(s): LIPASE, AMYLASE in the last 168 hours. No results for input(s): AMMONIA in the last 168 hours. CBC:  Recent Labs Lab 04/06/15 1413 04/07/15 0250 04/08/15 0526 04/09/15 0550 04/10/15 0520  WBC 42.6* 22.1* 14.6* 11.5*  10.3  NEUTROABS 41.3*  --   --   --  8.3*  HGB 10.1* 9.5* 9.1* 9.1* 9.0*  HCT 31.0* 29.2* 28.5* 29.2* 29.0*  MCV 91.4 92.1 94.1 95.7 93.9  PLT 221 174 186 197 192   Cardiac Enzymes:  Recent Labs Lab 04/06/15 1413  TROPONINI 0.06*   BNP (last 3 results)  Recent Labs  04/06/15 1413 04/08/15 0526  BNP 510.5* 127.0*    ProBNP (last 3 results)  Recent Labs  06/09/14 1818  PROBNP 4358.0*    CBG:  Recent Labs Lab 04/09/15 1725 04/09/15 2112 04/09/15 2240 04/10/15 0757 04/10/15 1157  GLUCAP 163* 109* 120* 72 127*    Recent Results (from the past 240 hour(s))  Culture, blood (routine x 2)     Status: None   Collection Time: 04/06/15  2:13 PM  Result Value Ref Range Status   Specimen Description BLOOD ARM RIGHT  Final   Special Requests BOTTLES DRAWN AEROBIC AND ANAEROBIC 5CC  Final   Culture  Setup Time   Final    GRAM NEGATIVE RODS AEROBIC BOTTLE ONLY CRITICAL RESULT CALLED TO, READ BACK BY AND VERIFIED WITH: Burnard Hawthorne, RN AT 1133 ON [id number] BY Lucienne Capers    Culture PSEUDOMONAS AERUGINOSA  Final   Report Status 04/09/2015 FINAL  Final   Organism ID, Bacteria PSEUDOMONAS AERUGINOSA  Final      Susceptibility   Pseudomonas aeruginosa - MIC*    CEFTAZIDIME 2 SENSITIVE Sensitive     CIPROFLOXACIN <=0.25 SENSITIVE Sensitive     GENTAMICIN <=1 SENSITIVE Sensitive     IMIPENEM 1 SENSITIVE Sensitive     PIP/TAZO <=4 SENSITIVE Sensitive     CEFEPIME 2 SENSITIVE Sensitive     * PSEUDOMONAS AERUGINOSA  Culture, Urine     Status: None   Collection Time: 04/06/15  2:52 PM  Result Value Ref Range Status   Specimen Description URINE, RANDOM  Final   Special Requests ADDED [id number]  Final   Culture MULTIPLE SPECIES PRESENT, SUGGEST RECOLLECTION  Final   Report Status 04/09/2015 FINAL  Final  Culture, blood (routine x 2)     Status: None (Preliminary result)   Collection Time: 04/06/15  3:00 PM  Result Value Ref Range Status   Specimen Description BLOOD LEFT  HAND  Final   Special Requests BOTTLES DRAWN AEROBIC AND ANAEROBIC  Final   Culture NO GROWTH 4 DAYS  Final   Report Status PENDING  Incomplete  MRSA PCR Screening     Status: Abnormal   Collection Time: 04/06/15 10:17 PM  Result Value Ref Range Status   MRSA by PCR POSITIVE (A) NEGATIVE Final    Comment:        The GeneXpert MRSA Assay (FDA approved for NASAL specimens only), is one component of a comprehensive MRSA colonization surveillance program. It is not intended to diagnose MRSA infection nor to guide or monitor treatment for MRSA infections. RESULT CALLED TO, READ BACK BY AND VERIFIED WITH: Tyler Aas RN [id number] 04/07/15 MITCHELL,L      Studies: No results found.  Scheduled Meds: . amLODipine  5 mg Oral Daily  .  aspirin EC  81 mg Oral Daily  . Chlorhexidine Gluconate Cloth  6 each Topical Q0600  . heparin  5,000 Units Subcutaneous 3 times per day  . insulin aspart  2-6 Units Subcutaneous TID WC  . insulin aspart  6 Units Subcutaneous TID WC  . insulin glargine  50 Units Subcutaneous BID  . levothyroxine  75 mcg Oral QAC breakfast  . metoprolol succinate  25 mg Oral Daily  . mupirocin ointment  1 application Nasal BID  . pantoprazole  40 mg Oral Q0600  . piperacillin-tazobactam (ZOSYN)  IV  3.375 g Intravenous 3 times per day   Continuous Infusions: . sodium chloride 1,000 mL (04/10/15 0854)  . sodium chloride 0.9 % 1,000 mL infusion 50 mL/hr at 04/10/15 0800    Principal Problem:   Severe sepsis Active Problems:   DM (diabetes mellitus), type 2 with renal complications   Essential hypertension   Peripheral vascular disease   CKD (chronic kidney disease), stage III   On home oxygen therapy   Hypothyroidism   Morbid obesity   Pressure ulcer   Acute exacerbation of CHF (congestive heart failure)   Diabetic foot infection   UTI (urinary tract infection)   Blood poisoning    Time spent: 35 mins    [facility] MD [facility] Pager  [phone]. If 7PM-7AM, please contact night-coverage at [email or web address], password [facility] 04/10/2015, 1:55 PM  LOS: 4 days

## 2015-04-10 NOTE — Progress Notes (Addendum)
Occupational Therapy Treatment Patient Details Name: Kimberly [name] MRN: [id number] DOB: 1955-12-19 Today's Date: 04/10/2015    History of present illness 59 year old female with diastolic heart failure, COPD, and vascular disease came to the [facility] emergency department on 04/06/2015 complaining of right heel pain. She has been followed by a local wound care clinic for several years. However, 2 days prior to admission she started to have increasing right heel pain and redness in the leg. This was associated with fatigue, malaise, and a low-grade fever. She also noted to ED staff increasing shortness of breath over the last several days. Presently diagnosed with severe sepsis secondary to bilateral heel wounds/and lower extremity cellulitis and urinary tract infection.   OT comments  Pt progressing. Education provided in session. Will continue to follow pt acutely.   Follow Up Recommendations  No OT follow up;Supervision/Assistance - 24 hour    Equipment Recommendations  None recommended by OT    Recommendations for Other Services      Precautions / Restrictions Precautions Precautions: Fall Precaution Comments: pt reported loss of sensation in B LE and that her balance is severely decreased due to complications of previous stroke Restrictions Weight Bearing Restrictions: No       Mobility Bed Mobility               General bed mobility comments: not assessed  Transfers Overall transfer level: Needs assistance Equipment used: Rolling walker (2 wheeled) Transfers: Sit to/from [employer] Sit to Stand: Min guard Stand pivot transfers: Min guard            Balance  Min guard for transfers with RW.                                 ADL Overall ADL's : Needs assistance/impaired         Upper Body Bathing: Set up;Sitting Upper Body Bathing Details (indicate cue type and reason): washed armpits; OT untied and tied gown for pt to  bathe             Toilet Transfer: Min guard;Stand-pivot;BSC;RW   Toileting- Clothing Manipulation and Hygiene: Moderate assistance;Sit to/from stand Toileting - Clothing Manipulation Details (indicate cue type and reason): assisted with toilet hygiene; feel pt could assist with clothing     Functional mobility during ADLs: Min guard;Rolling walker (stand pivot) General ADL Comments: Discussed what pt could use for toilet aide for hygiene and suggested wipes and also talked about using long sponge.  Pt reports she performs grooming in sitting position at home. Placed pillow under pt's bottom for her to sit on in chair. Discussed alternative technique for LB ADLs (leaning side to side). Pt became short of breath in session. Encouraged pt to try to sit up in chair until she eats lunch, before returning to bed.      Vision                     Perception     Praxis      Cognition  Awake/Alert Behavior During Therapy: WFL for tasks assessed/performed Overall Cognitive Status: Within Functional Limits for tasks assessed                       Extremity/Trunk Assessment               Exercises  Encouraged pt to be moving arms while in  chair and went over some arm exercises; May give her theraband next session.   Shoulder Instructions       General Comments      Pertinent Vitals/ Pain       Pain Assessment: 0-10 Pain Score: 3  Pain Location: bilateral LEs Pain Descriptors / Indicators: Aching (sprain) Pain Intervention(s): Monitored during session;Repositioned  Home Living                                          Prior Functioning/Environment              Frequency Min 2X/week     Progress Toward Goals  OT Goals(current goals can now be found in the care plan section)  Progress towards OT goals: Progressing toward goals  Acute Rehab OT Goals Patient Stated Goal: not stated OT Goal Formulation: With patient Time For  Goal Achievement: 04/23/15 Potential to Achieve Goals: Good ADL Goals Pt Will Perform Grooming: with modified independence;standing Pt Will Perform Upper Body Bathing: with modified independence;sitting Pt Will Perform Upper Body Dressing: with modified independence;sitting Pt Will Transfer to Toilet: with modified independence;bedside commode Pt/caregiver will Perform Home Exercise Program: Increased strength;Both right and left upper extremity;With theraband;With written HEP provided;With Supervision  Plan Discharge plan remains appropriate    Co-evaluation                 End of Session Equipment Utilized During Treatment: Gait belt;Rolling walker;Oxygen   Activity Tolerance Patient tolerated treatment well  Patient Left in chair;with call bell/phone within reach   Nurse Communication          Time: 1610-9604 OT Time Calculation (min): 18 min  Charges: OT General Charges $OT Visit: 1 Procedure OT Treatments $Self Care/Home Management : 8-22 mins  Earlie Raveling OTR/L [phone] 04/10/2015, 12:07 PM

## 2015-04-11 DIAGNOSIS — N179 Acute kidney failure, unspecified: Secondary | ICD-10-CM | POA: Insufficient documentation

## 2015-04-11 DIAGNOSIS — I5033 Acute on chronic diastolic (congestive) heart failure: Secondary | ICD-10-CM | POA: Insufficient documentation

## 2015-04-11 DIAGNOSIS — N189 Chronic kidney disease, unspecified: Secondary | ICD-10-CM

## 2015-04-11 LAB — GLUCOSE, CAPILLARY
GLUCOSE-CAPILLARY: 68 mg/dL (ref 65–99)
Glucose-Capillary: 94 mg/dL (ref 65–99)
Glucose-Capillary: 124 mg/dL — ABNORMAL HIGH (ref 65–99)
Glucose-Capillary: 94 mg/dL (ref 65–99)
Glucose-Capillary: 137 mg/dL — ABNORMAL HIGH (ref 65–99)

## 2015-04-11 LAB — BASIC METABOLIC PANEL
Anion gap: 11 (ref 5–15)
BUN: 82 mg/dL — ABNORMAL HIGH (ref 6–20)
CALCIUM: 8.5 mg/dL — AB (ref 8.9–10.3)
CO2: 22 mmol/L (ref 22–32)
Chloride: 106 mmol/L (ref 101–111)
Creatinine, Ser: 3.59 mg/dL — ABNORMAL HIGH (ref 0.44–1.00)
GFR calc non Af Amer: 13 mL/min — ABNORMAL LOW (ref >60)
GFR, EST AFRICAN AMERICAN: 15 mL/min — AB (ref >60)
GLUCOSE: 67 mg/dL (ref 65–99)
Potassium: 4.3 mmol/L (ref 3.5–5.1)
Sodium: 139 mmol/L (ref 135–145)

## 2015-04-11 LAB — CULTURE, BLOOD (ROUTINE X 2): Culture: NO GROWTH

## 2015-04-11 LAB — CBC
HCT: 29.1 % — ABNORMAL LOW (ref 36.0–46.0)
Hemoglobin: 9.1 g/dL — ABNORMAL LOW (ref 12.0–15.0)
MCH: 29.3 pg (ref 26.0–34.0)
MCHC: 31.3 g/dL (ref 30.0–36.0)
MCV: 93.6 fL (ref 78.0–100.0)
Platelets: 180 10*3/uL (ref 150–400)
RBC: 3.11 MIL/uL — AB (ref 3.87–5.11)
RDW: 15.9 % — ABNORMAL HIGH (ref 11.5–15.5)
WBC: 9.7 10*3/uL (ref 4.0–10.5)

## 2015-04-11 MED ORDER — FUROSEMIDE 10 MG/ML IJ SOLN
60.0000 mg | Freq: Two times a day (BID) | INTRAMUSCULAR | Status: DC
  Administered 2015-04-11 – 2015-04-12 (×3): 60 mg via INTRAVENOUS
  Filled 2015-04-11 (×2): qty 6

## 2015-04-11 MED ORDER — DEXTROSE 5 % IV SOLN
2.0000 g | INTRAVENOUS | Status: DC
  Administered 2015-04-11 – 2015-04-14 (×4): 2 g via INTRAVENOUS
  Filled 2015-04-11 (×4): qty 2

## 2015-04-11 MED ORDER — INSULIN GLARGINE 100 UNIT/ML ~~LOC~~ SOLN
46.0000 [IU] | Freq: Two times a day (BID) | SUBCUTANEOUS | Status: DC
  Administered 2015-04-11 – 2015-04-13 (×5): 46 [IU] via SUBCUTANEOUS
  Filled 2015-04-11 (×7): qty 0.46

## 2015-04-11 NOTE — Progress Notes (Signed)
ANTIBIOTIC CONSULT NOTE - INITIAL  Pharmacy Consult for Ceftazidime Indication: Wound Infection  Allergies  Allergen Reactions  . Ciprofloxacin Itching  . Epinephrine     Increased heart rate    Patient Measurements: Height: 5\' 4"  (162.6 cm) Weight: 293 lb 14 oz (133.3 kg) IBW/kg (Calculated) : 54.7 Vital Signs: Temp: 97.9 F (36.6 C) (07/31 0628) Temp Source: Oral (07/31 0628) BP: 133/45 mmHg (07/31 0628) Pulse Rate: 78 (07/31 0628) Intake/Output from previous day: 07/30 0701 - 07/31 0700 In: 1736.7 [P.O.:540; I.V.:1096.7; IV Piggyback:100] Out: 750 [Urine:750] Intake/Output from this shift: Total I/O In: 360 [P.O.:360] Out: 300 [Urine:300]  Labs:  Recent Labs  04/09/15 0550 04/10/15 0520 04/10/15 0559 04/11/15 0546  WBC 11.5* 10.3  --  9.7  HGB 9.1* 9.0*  --  9.1*  PLT 197 192  --  180  LABCREA  --   --  71.02  --   CREATININE 3.47* 3.23*  --  3.59*   Estimated Creatinine Clearance: 22.7 mL/min (by C-G formula based on Cr of 3.59). No results for input(s): VANCOTROUGH, VANCOPEAK, VANCORANDOM, GENTTROUGH, GENTPEAK, GENTRANDOM, TOBRATROUGH, TOBRAPEAK, TOBRARND, AMIKACINPEAK, AMIKACINTROU, AMIKACIN in the last 72 hours.   Microbiology: Recent Results (from the past 720 hour(s))  Culture, blood (routine x 2)     Status: None   Collection Time: 04/06/15  2:13 PM  Result Value Ref Range Status   Specimen Description BLOOD ARM RIGHT  Final   Special Requests BOTTLES DRAWN AEROBIC AND ANAEROBIC 5CC  Final   Culture  Setup Time   Final    GRAM NEGATIVE RODS AEROBIC BOTTLE ONLY CRITICAL RESULT CALLED TO, READ BACK BY AND VERIFIED WITH: Burnard Hawthorne, RN AT 1133 ON [id number] BY Lucienne Capers    Culture PSEUDOMONAS AERUGINOSA  Final   Report Status 04/09/2015 FINAL  Final   Organism ID, Bacteria PSEUDOMONAS AERUGINOSA  Final      Susceptibility   Pseudomonas aeruginosa - MIC*    CEFTAZIDIME 2 SENSITIVE Sensitive     CIPROFLOXACIN <=0.25 SENSITIVE Sensitive      GENTAMICIN <=1 SENSITIVE Sensitive     IMIPENEM 1 SENSITIVE Sensitive     PIP/TAZO <=4 SENSITIVE Sensitive     CEFEPIME 2 SENSITIVE Sensitive     * PSEUDOMONAS AERUGINOSA  Culture, Urine     Status: None   Collection Time: 04/06/15  2:52 PM  Result Value Ref Range Status   Specimen Description URINE, RANDOM  Final   Special Requests ADDED [id number]  Final   Culture MULTIPLE SPECIES PRESENT, SUGGEST RECOLLECTION  Final   Report Status 04/09/2015 FINAL  Final  Culture, blood (routine x 2)     Status: None (Preliminary result)   Collection Time: 04/06/15  3:00 PM  Result Value Ref Range Status   Specimen Description BLOOD LEFT HAND  Final   Special Requests BOTTLES DRAWN AEROBIC AND ANAEROBIC  Final   Culture NO GROWTH 4 DAYS  Final   Report Status PENDING  Incomplete  MRSA PCR Screening     Status: Abnormal   Collection Time: 04/06/15 10:17 PM  Result Value Ref Range Status   MRSA by PCR POSITIVE (A) NEGATIVE Final    Comment:        The GeneXpert MRSA Assay (FDA approved for NASAL specimens only), is one component of a comprehensive MRSA colonization surveillance program. It is not intended to diagnose MRSA infection nor to guide or monitor treatment for MRSA infections. RESULT CALLED TO, READ BACK BY AND VERIFIED WITH:  [facility] RN 1610 04/07/15 MITCHELL,L     Medical History: Past Medical History  Diagnosis Date  . HYPERLIPIDEMIA   . HYPERTENSION   . CAD, NATIVE VESSEL     May 10, 2010 cath showed a hyperdynamic LV function, she had dominant circumflex anatomy with a 70-80% small OM1. She had diffuse diabetic plaque particularly in the distal LAD. She nondominant RCA.  Nondominant  . PVD     CEA  . COPD   . Edema   . CHF (congestive heart failure)     Preserved EF  . Cellulitis 10/15/2013    BILATERAL  . Diabetic neuropathy   . Anasarca 05/2014  . Type II diabetes mellitus   . Proliferative retinopathy     hx/notes 01/27/2010  . Peripheral neuropathy      hx/notes 01/27/2010  . Complication of anesthesia     "they had trouble reviving me after the latest carotid OR" (04/06/2015)  . Heart attack     "on the table when I had my last carotid OR" (04/06/2015)  . Asthma   . On home oxygen therapy     "2.5L; 24/7" (04/06/2015)  . Pneumonia "several times"  . Chronic bronchitis     "often; usually q yr" (04/06/2015)  . Hypothyroidism   . History of hiatal hernia   . GERD (gastroesophageal reflux disease)   . 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 Arthritis     "aches and pains all over" (04/06/2015)  . Anxiety   . Depression   . Chronic kidney disease     "they've had me on dialysis periodically" (04/06/2015)  . Paralyzed vocal cords     Medications:  Scheduled:  . amLODipine  5 mg Oral Daily  . aspirin EC  81 mg Oral Daily  . cefTAZidime (FORTAZ)  IV  2 g Intravenous Q24H  . furosemide  60 mg Intravenous Q12H  . heparin  5,000 Units Subcutaneous 3 times per day  . insulin aspart  2-6 Units Subcutaneous TID WC  . insulin aspart  6 Units Subcutaneous TID WC  . insulin glargine  50 Units Subcutaneous BID  . levothyroxine  75 mcg Oral QAC breakfast  . metoprolol succinate  25 mg Oral Daily  . mupirocin ointment  1 application Nasal BID  . pantoprazole  40 mg Oral Q0600   Assessment: 59 yoF being treated for wound infection. Previously on vanco/zosyn for empiric coverage. Blood Cx grew 1/2 Pseudomonas. Pt then changed to Cipro. Pt had reaction to cipro and is being transitioned to ceftazidime. WBC improved wnl, Scr 3.59, afebrile.   Zosyn 7/26>>7/31 Vanco 7/26>>7/27 Cipro 7/30 x1 dose Elita Quick 7/31 >>  BCx 7/26 1/2 Pseudomonas (pan-sensitive)   Goal of Therapy:  Control of infection and clinical infection  Plan:  Start Ceftazidime 2G IV Q24hr Monitor WBC and SCr  Remi Haggard, PharmD Clinical Pharmacist-  Resident Pager: [phone]  Remi Haggard 04/11/2015,11:32 AM

## 2015-04-11 NOTE — Progress Notes (Signed)
Cipro IV given at 2211. Pt c/o burning and itching. Redness noted at administration site and along vein. Cipro stopped at 2230 and on-call physician paged. New orders received and implemented for Zosyn. Cipro discontinued. Will continue to monitor.

## 2015-04-11 NOTE — Progress Notes (Signed)
[facility] PROGRESS NOTE  Kimberly [name] WJX:[id number] DOB: May 13, 1956 DOA: 04/06/2015 PCP: [name], MD  Assessment/Plan: #1 severe sepsis secondary to bilateral heel wounds/and lower extremity cellulitis and urinary tract infection.  Patient currently afebrile. Blood pressure has improved. Urine cultures pending. Blood cultures 1 out of 2 with Pseudomonas likely a contaminant. Patient with clinical improvement. Change empiric IV Zosyn to IV Elita Quick as patient could not tolerate IV ciprofloxacin yesterday. Profore  compression wraps to bilateral lower extremities. heel floaters. Patient's been seen in consultation by orthopedics who recommended IV antibiotics, compression wraps, outpatient follow-up. Patient will follow-up with the wound care center on discharge. Follow.  #2 urinary tract infection Urine cultures pending. Narrow down antibiotics to IV Fortaz as patient cannot tolerate IV ciprofloxacin yesterday.  #3 bilateral heel wounds/lower extremity cellulitis See problem #1.  #4 probable acute on chronic diastolic CHF exacerbation Patient on admission was hypotensive and as such could not be diuresed. Patient's blood pressure responded to fluids. Patient looks volume overloaded on examination. Patient's blood pressure has improved. Patient's renal function is worsened and could be secondary to acute on chronic diastolic CHF exacerbation. Will place patient on Lasix 60 mg IV every 12 hours. Strict I's and O's. Daily weights.   #5 history of CVA Stable.  #6 hypothyroidism Continue home dose Synthroid.  #7 diabetes mellitus   hemoglobin A1c was 7.0 on 06/09/2014. Repeat a hemoglobin A1c. CBGs have ranged from 94-109. Decrease Lantus to 46 units twice daily. Sliding scale insulin.  #8 hypertension Stable. Continue Norvasc and metoprolol.  #9 morbid obesity  #10 acute on chronic chronic kidney disease stage III May be secondary to ATN secondary to sepsis versus  secondary to a prerenal azotemia secondary to acute CHF exacerbation. Patient does look volume overloaded on exam.. Baseline creatinine about 2.0. Creatinine trending back up.  Urine sodium was less than 10. Discontinue IV fluids. Will place on Lasix 60 mg IV every 12 hours and follow.  #11 prophylaxis Heparin for DVT prophylaxis.  Code Status: DO NOT RESUSCITATE Family Communication: Updated patient at bedside. No family present. Disposition Plan: When medically stable, probably home with home health.   Consultants:  Orthopedics: Dr. Lajoyce Corners 04/06/2015  Procedures:  Chest x-ray 04/06/2015  Antibiotics:  IV Zosyn 04/06/2015>>>>> 04/11/2015  IV vancomycin 04/06/2015>>>> 04/07/2015  IV ciprofloxacin 04/10/2015 >>>> 04/10/2015  IV Elita Quick 04/11/2015  HPI/Subjective: Patient sitting up in chair. Patient denies shortness of breath. No chest pain. Patient states she's feeling better. Patient states some bleeding around heparin site with some nasal bleeding yesterday which has since resolved. Some reaction yesterday to IV Cipro at IV site.  Objective: Filed Vitals:   04/11/15 0628  BP: 133/45  Pulse: 78  Temp: 97.9 F (36.6 C)  Resp: 18    Intake/Output Summary (Last 24 hours) at 04/11/15 1109 Last data filed at 04/11/15 1039  Gross per 24 hour  Intake 1796.66 ml  Output    800 ml  Net 996.66 ml   Filed Weights   04/09/15 0617 04/10/15 0538 04/11/15 0628  Weight: 132 kg (291 lb 0.1 oz) 133.267 kg (293 lb 12.8 oz) 133.3 kg (293 lb 14 oz)    Exam:   General:  NAD  Cardiovascular: RRR  Respiratory: Poor air movement. Bibasilar crackles.  Abdomen: Soft, nontender, nondistended, positive bowel sounds.  Musculoskeletal: No clubbing or cyanosis. Bilateral lower extremities wrapped. 2-3+ bilateral lower extremity edema up to hips.  Data Reviewed: Basic Metabolic Panel:  Recent Labs Lab 04/07/15 0250 04/08/15  4098 04/09/15 0550 04/10/15 0520 04/11/15 0546  NA  133* 136 139 140 139  K 3.8 4.3 5.2* 4.5 4.3  CL 97* 99* 103 107 106  CO2 GLUCOSE 372* 95 158* 82 67  BUN 81* 79* 82* 81* 82*  CREATININE 3.00* 3.19* 3.47* 3.23* 3.59*  CALCIUM 8.6* 8.8* 8.7* 8.7* 8.5*  MG 1.9  --   --   --   --   PHOS 4.0  --   --   --   --    Liver Function Tests:  Recent Labs Lab 04/06/15 1413  AST 27  ALT 13*  ALKPHOS 96  BILITOT 1.6*  PROT 8.0  ALBUMIN 3.2*   No results for input(s): LIPASE, AMYLASE in the last 168 hours. No results for input(s): AMMONIA in the last 168 hours. CBC:  Recent Labs Lab 04/06/15 1413 04/07/15 0250 04/08/15 0526 04/09/15 0550 04/10/15 0520 04/11/15 0546  WBC 42.6* 22.1* 14.6* 11.5* 10.3 9.7  NEUTROABS 41.3*  --   --   --  8.3*  --   HGB 10.1* 9.5* 9.1* 9.1* 9.0* 9.1*  HCT 31.0* 29.2* 28.5* 29.2* 29.0* 29.1*  MCV 91.4 92.1 94.1 95.7 93.9 93.6  PLT 221 174 186 197 192 180   Cardiac Enzymes:  Recent Labs Lab 04/06/15 1413  TROPONINI 0.06*   BNP (last 3 results)  Recent Labs  04/06/15 1413 04/08/15 0526  BNP 510.5* 127.0*    ProBNP (last 3 results)  Recent Labs  06/09/14 1818  PROBNP 4358.0*    CBG:  Recent Labs Lab 04/10/15 1157 04/10/15 1643 04/10/15 2222 04/11/15 0652 04/11/15 0825  GLUCAP 127* 139* 109* 68 94    Recent Results (from the past 240 hour(s))  Culture, blood (routine x 2)     Status: None   Collection Time: 04/06/15  2:13 PM  Result Value Ref Range Status   Specimen Description BLOOD ARM RIGHT  Final   Special Requests BOTTLES DRAWN AEROBIC AND ANAEROBIC 5CC  Final   Culture  Setup Time   Final    GRAM NEGATIVE RODS AEROBIC BOTTLE ONLY CRITICAL RESULT CALLED TO, READ BACK BY AND VERIFIED WITH: Burnard Hawthorne, RN AT 1133 ON [id number] BY Lucienne Capers    Culture PSEUDOMONAS AERUGINOSA  Final   Report Status 04/09/2015 FINAL  Final   Organism ID, Bacteria PSEUDOMONAS AERUGINOSA  Final      Susceptibility   Pseudomonas aeruginosa - MIC*    CEFTAZIDIME 2  SENSITIVE Sensitive     CIPROFLOXACIN <=0.25 SENSITIVE Sensitive     GENTAMICIN <=1 SENSITIVE Sensitive     IMIPENEM 1 SENSITIVE Sensitive     PIP/TAZO <=4 SENSITIVE Sensitive     CEFEPIME 2 SENSITIVE Sensitive     * PSEUDOMONAS AERUGINOSA  Culture, Urine     Status: None   Collection Time: 04/06/15  2:52 PM  Result Value Ref Range Status   Specimen Description URINE, RANDOM  Final   Special Requests ADDED [id number]  Final   Culture MULTIPLE SPECIES PRESENT, SUGGEST RECOLLECTION  Final   Report Status 04/09/2015 FINAL  Final  Culture, blood (routine x 2)     Status: None (Preliminary result)   Collection Time: 04/06/15  3:00 PM  Result Value Ref Range Status   Specimen Description BLOOD LEFT HAND  Final   Special Requests BOTTLES DRAWN AEROBIC AND ANAEROBIC  Final   Culture NO GROWTH 4 DAYS  Final   Report Status PENDING  Incomplete  MRSA PCR Screening     Status: Abnormal   Collection Time: 04/06/15 10:17 PM  Result Value Ref Range Status   MRSA by PCR POSITIVE (A) NEGATIVE Final    Comment:        The GeneXpert MRSA Assay (FDA approved for NASAL specimens only), is one component of a comprehensive MRSA colonization surveillance program. It is not intended to diagnose MRSA infection nor to guide or monitor treatment for MRSA infections. RESULT CALLED TO, READ BACK BY AND VERIFIED WITH: Tyler Aas RN 1610 04/07/15 MITCHELL,L      Studies: No results found.  Scheduled Meds: . amLODipine  5 mg Oral Daily  . aspirin EC  81 mg Oral Daily  . furosemide  60 mg Intravenous Q12H  . heparin  5,000 Units Subcutaneous 3 times per day  . insulin aspart  2-6 Units Subcutaneous TID WC  . insulin aspart  6 Units Subcutaneous TID WC  . insulin glargine  50 Units Subcutaneous BID  . levothyroxine  75 mcg Oral QAC breakfast  . metoprolol succinate  25 mg Oral Daily  . mupirocin ointment  1 application Nasal BID  . pantoprazole  40 mg Oral Q0600   Continuous Infusions:     Principal Problem:   Severe sepsis Active Problems:   DM (diabetes mellitus), type 2 with renal complications   Essential hypertension   Peripheral vascular disease   CKD (chronic kidney disease), stage III   On home oxygen therapy   Hypothyroidism   Morbid obesity   Pressure ulcer   Acute exacerbation of CHF (congestive heart failure)   Diabetic foot infection   UTI (urinary tract infection)   Blood poisoning    Time spent: 35 mins    [facility] MD [facility] Pager [phone]. If 7PM-7AM, please contact night-coverage at [email or web address], password [facility] 04/11/2015, 11:09 AM  LOS: 5 days

## 2015-04-12 ENCOUNTER — Inpatient Hospital Stay (HOSPITAL_COMMUNITY): Payer: Managed Care, Other (non HMO)

## 2015-04-12 DIAGNOSIS — N179 Acute kidney failure, unspecified: Secondary | ICD-10-CM | POA: Insufficient documentation

## 2015-04-12 LAB — URINALYSIS, ROUTINE W REFLEX MICROSCOPIC
Bilirubin Urine: NEGATIVE
GLUCOSE, UA: NEGATIVE mg/dL
Ketones, ur: NEGATIVE mg/dL
Nitrite: NEGATIVE
Protein, ur: 30 mg/dL — AB
SPECIFIC GRAVITY, URINE: 1.01 (ref 1.005–1.030)
Urobilinogen, UA: 0.2 mg/dL (ref 0.0–1.0)
pH: 5 (ref 5.0–8.0)

## 2015-04-12 LAB — MAGNESIUM: Magnesium: 2.3 mg/dL (ref 1.7–2.4)

## 2015-04-12 LAB — GLUCOSE, CAPILLARY
GLUCOSE-CAPILLARY: 98 mg/dL (ref 65–99)
Glucose-Capillary: 109 mg/dL — ABNORMAL HIGH (ref 65–99)
Glucose-Capillary: 163 mg/dL — ABNORMAL HIGH (ref 65–99)
Glucose-Capillary: 120 mg/dL — ABNORMAL HIGH (ref 65–99)

## 2015-04-12 LAB — RENAL FUNCTION PANEL
Albumin: 2.5 g/dL — ABNORMAL LOW (ref 3.5–5.0)
Anion gap: 8 (ref 5–15)
BUN: 83 mg/dL — ABNORMAL HIGH (ref 6–20)
CO2: 24 mmol/L (ref 22–32)
Calcium: 8.6 mg/dL — ABNORMAL LOW (ref 8.9–10.3)
Chloride: 106 mmol/L (ref 101–111)
Creatinine, Ser: 4.21 mg/dL — ABNORMAL HIGH (ref 0.44–1.00)
GFR calc Af Amer: 12 mL/min — ABNORMAL LOW
GFR calc non Af Amer: 11 mL/min — ABNORMAL LOW
Glucose, Bld: 126 mg/dL — ABNORMAL HIGH (ref 65–99)
Phosphorus: 5.9 mg/dL — ABNORMAL HIGH (ref 2.5–4.6)
Potassium: 4.2 mmol/L (ref 3.5–5.1)
Sodium: 138 mmol/L (ref 135–145)

## 2015-04-12 LAB — URINE MICROSCOPIC-ADD ON

## 2015-04-12 LAB — CBC
HCT: 29 % — ABNORMAL LOW (ref 36.0–46.0)
Hemoglobin: 8.9 g/dL — ABNORMAL LOW (ref 12.0–15.0)
MCH: 28.5 pg (ref 26.0–34.0)
MCHC: 30.7 g/dL (ref 30.0–36.0)
MCV: 92.9 fL (ref 78.0–100.0)
Platelets: 193 K/uL (ref 150–400)
RBC: 3.12 MIL/uL — ABNORMAL LOW (ref 3.87–5.11)
RDW: 16 % — ABNORMAL HIGH (ref 11.5–15.5)
WBC: 11.6 K/uL — ABNORMAL HIGH (ref 4.0–10.5)

## 2015-04-12 MED ORDER — FUROSEMIDE 10 MG/ML IJ SOLN
120.0000 mg | Freq: Three times a day (TID) | INTRAVENOUS | Status: DC
  Administered 2015-04-12 – 2015-04-18 (×17): 120 mg via INTRAVENOUS
  Filled 2015-04-12 (×19): qty 12

## 2015-04-12 MED ORDER — DARBEPOETIN ALFA 150 MCG/0.3ML IJ SOSY
150.0000 ug | PREFILLED_SYRINGE | INTRAMUSCULAR | Status: DC
  Administered 2015-04-13: 150 ug via SUBCUTANEOUS
  Filled 2015-04-12 (×3): qty 0.3

## 2015-04-12 NOTE — Progress Notes (Signed)
Physical Therapy Treatment Patient Details Name: Kimberly [name] MRN: [id number] DOB: September 04, 1956 Today's Date: 04/12/2015    History of Present Illness 59 year old female with diastolic heart failure, COPD, and vascular disease came to the [facility] emergency department on 04/06/2015 complaining of right heel pain. She has been followed by a local wound care clinic for several years. However, 2 days prior to admission she started to have increasing right heel pain and redness in the leg. This was associated with fatigue, malaise, and a low-grade fever. She also noted to ED staff increasing shortness of breath over the last several days. Presently diagnosed with severe sepsis secondary to bilateral heel wounds/and lower extremity cellulitis and urinary tract infection.    PT Comments    Pt is progressing well with her ambulation distance, but seems more limited today by DOE and decreased O2 sats with gait on 3 L O2 [place].  Pt requesting a breathing treatment, RN made aware.  Continue to recommend HHPT at discharge and PT will continue to follow acutely to work on activity tolerance, strength and increased gait distance.       Follow Up Recommendations  Home health PT     Equipment Recommendations  None recommended by PT       Precautions / Restrictions Precautions Precautions: Fall Precaution Comments: pt reported loss of sensation in B LE and that her balance is severely decreased due to complications of previous stroke    Mobility  Bed Mobility Overal bed mobility: Needs Assistance Bed Mobility: Sit to Supine       Sit to supine: Mod assist   General bed mobility comments: Mod assist to help lift bil legs to get back into the bed from sitting.  She uses momentum to get trunk back into bed.   Transfers Overall transfer level: Needs assistance Equipment used: Rolling walker (2 wheeled) Transfers: Sit to/from Stand Sit to Stand: Supervision         General transfer comment:  Supervision for safety due to pt using momentum and relying on bil upper extremity support to get to standing.  Uncontrolled "plop" descent to sit using arms to help.   Ambulation/Gait Ambulation/Gait assistance: Min guard Ambulation Distance (Feet): 40 Feet Assistive device: Rolling walker (2 wheeled) Gait Pattern/deviations: Step-through pattern;Trunk flexed;Wide base of support Gait velocity: decreased Gait velocity interpretation: Below normal speed for age/gender General Gait Details: Pt with flexed trunk, increased DOE 3/4 during gait.  Pt with wide BOS likely due to body habitus and decreased foot clearance during gait.           Balance Overall balance assessment: Needs assistance         Standing balance support: Bilateral upper extremity supported Standing balance-Leahy Scale: Fair                      Cognition Arousal/Alertness: Awake/alert Behavior During Therapy: WFL for tasks assessed/performed Overall Cognitive Status: Within Functional Limits for tasks assessed                      Exercises General Exercises - Upper Extremity Shoulder Flexion: AROM;Both;10 reps;Other (comment) (limited by RTC issues per pt, just above 90 bil) General Exercises - Lower Extremity Long Arc Quad: AROM;Both;10 reps;Seated Hip Flexion/Marching: AROM;Both;10 reps;Seated Toe Raises: AROM;Both;10 reps;Seated Heel Raises: AROM;Both;10 reps;Seated        Pertinent Vitals/Pain Pain Assessment: No/denies pain           PT Goals (current goals  can now be found in the care plan section) Acute Rehab PT Goals Patient Stated Goal: to get back to being a short community ambulator.  Progress towards PT goals: Progressing toward goals    Frequency  Min 3X/week    PT Plan Current plan remains appropriate       End of Session Equipment Utilized During Treatment: Oxygen Activity Tolerance: Patient limited by fatigue;Treatment limited secondary to medical  complications (Comment) (limited by DOE) Patient left: in bed;with call bell/phone within reach     Time: 1423-1501 PT Time Calculation (min) (ACUTE ONLY): 38 min  Charges:  $Gait Training: 8-22 mins $Therapeutic Exercise: 8-22 mins $Therapeutic Activity: 8-22 mins                      Tarell Schollmeyer B. Danaja [place], PT, DPT [phone]   04/12/2015, 3:53 PM

## 2015-04-12 NOTE — Consult Note (Addendum)
WOC follow-up: Bedside nurse and patient report inner abd folds are red, macerated and weeping.  Description consistent with intertrigo.  Interdry silver impregnated fabric ordered for staff nurse use and instructions provided; this will provide antimicrobial benefits and wick drainage away from skin. Changed bilat Profore compression wraps.  Deep tissue injuries unchanged from previous assessment last week. Left outer small toe .3X.3cm, dark purple with intact skin, no odor or drainage. Left heel .5X.5cm dark purple with intact skin, no odor or drainage. Right heel 4X5cm, dark purple with intact skin, no odor or drainage. Applied 4 layer Profore compression wraps as requested by the ortho service.  Pt will need follow-up by home health for dressing changes twice a week.  Float heels to reduce pressure. Discussed plan of care with patient and she verbalized understanding.  Plan to change dressings on THURS if still in the hospital at that time. Please re-consult if further assistance is needed.  Thank-you,  Cammie Mcgee MSN, RN, CWOCN, [place], CNS [phone]

## 2015-04-12 NOTE — Progress Notes (Signed)
Occupational Therapy Treatment Patient Details Name: Kimberly [name] MRN: [id number] DOB: July 09, 1956 Today's Date: 04/12/2015    History of present illness 59 year old female with diastolic heart failure, COPD, and vascular disease came to the [facility] emergency department on 04/06/2015 complaining of right heel pain. She has been followed by [name] local wound care clinic for several years. However, 2 days prior to admission she started to have increasing right heel pain and redness in the leg. This was associated with fatigue, malaise, and [name] low-grade fever. She also noted to ED staff increasing shortness of breath over the last several days. Presently diagnosed with severe sepsis secondary to bilateral heel wounds/and lower extremity cellulitis and urinary tract infection.   OT comments  Patient reports she is at baseline and does not wish for OT to follow her in the hospital nor does she want HHOT at discharge. OT will sign off.  Follow Up Recommendations    No OT follow up; Supervision/Assistance - Intermittent   Equipment Recommendations    None recommended by OT   Recommendations for Other Services      Precautions / Restrictions Precautions Precautions: Fall Precaution Comments: pt reported loss of sensation in B LE and that her balance is severely decreased due to complications of previous stroke Restrictions Weight Bearing Restrictions: No       Mobility Bed Mobility               General bed mobility comments: not assessed  Transfers                      Balance                                   ADL                                         General ADL Comments: Discussion with patient regarding OT plan of care. She reports she is at baseline for ADLs, and that her husband will get her "toilet wand" out of storage for her use at home. She denies need to practice any ADLs or toilet transfers with OT, reporting that she does it  with nursing staff and they "just watch" her "for safety" but are not hands on. Pt reports, "I have been through OT more than four times" and states she does not need further OT in the hospital or at home. OT will sign off per patient's request.      Vision                     Perception     Praxis      Cognition   Behavior During Therapy: [facility] for tasks assessed/performed Overall Cognitive Status: Within Functional Limits for tasks assessed                       Extremity/Trunk Assessment               Exercises     Shoulder Instructions       General Comments      Pertinent Vitals/ Pain       Pain Assessment: No/denies pain  Home Living  Prior Functioning/Environment              Frequency       Progress Toward Goals  OT Goals(current goals can now be found in the care plan section)  Progress towards OT goals: Goals met/education completed, patient discharged from [facility]                 End of Session     Activity Tolerance     Patient Left     Nurse Communication          Time: 3744-5146 OT Time Calculation (min): 13 min  Charges: OT General Charges $OT Visit: 1 Procedure OT Treatments $Self Care/Home Management : 8-22 mins  Kimberly [name] 04/12/2015, 10:49 AM

## 2015-04-12 NOTE — Care Management Note (Signed)
Case Management Note  Patient Details  Name: DAISSY YERIAN MRN: [id number] Date of Birth: [birth year]  Subjective/Objective:                 Patient from home with husband. Has oxygen at home, on 2L prior to admission. Patient states that she has walker at home and has had [facility] PT several times in the past and declined getting after discharge stating that she knows what to do. She feels she may need a nurse after discharge or to follow up at the wound clinic. She stated that if she had a HH RN she would like to use [facility].      Action/Plan:  HH RN set up with [facility]. Miranda notified of referral 04-12-15. Discharge expected in 1-2 days once AKI resolves.   Expected Discharge Date:                  Expected Discharge Plan:  Home w Home Health Services  In-House Referral:     Discharge planning Services  CM Consult  Post Acute Care Choice:  NA Choice offered to:  Patient  DME Arranged:    DME Agency:     HH Arranged:  RN HH Agency:  [facility]  Status of Service:  In process, will continue to follow  Medicare Important Message Given:    Date Medicare IM Given:    Medicare IM give by:    Date Additional Medicare IM Given:    Additional Medicare Important Message give by:     If discussed at Long Length of Stay Meetings, dates discussed:    Additional Comments:  Lawerance Sabal, RN 04/12/2015, 3:03 PM

## 2015-04-12 NOTE — Progress Notes (Signed)
[facility] PROGRESS NOTE  Kimberly [name] ZOX:[id number] DOB: July 24, 1956 DOA: 04/06/2015 PCP: [name], MD  Assessment/Plan: #1 severe sepsis secondary to bilateral heel wounds/and lower extremity cellulitis and urinary tract infection.  Patient currently afebrile. Blood pressure has improved. Urine cultures with multiple species. Blood cultures 1/2 with Pseudomonas likely a contaminant. Patient with clinical improvement. Changed empiric IV Zosyn to IV Elita Quick as patient could not tolerate IV ciprofloxacin. Profore  compression wraps to bilateral lower extremities. heel floaters. Patient has been seen in consultation by orthopedics who recommended IV antibiotics, compression wraps, outpatient follow-up. Patient will follow-up with the wound care center on discharge. Follow.  #2 urinary tract infection Multiple species present.   #3 bilateral heel wounds/lower extremity cellulitis See problem #1.  #4 probable acute on chronic diastolic CHF exacerbation Patient on admission was hypotensive and as such could not be diuresed. Patient's blood pressure responded to fluids. Patient looks volume overloaded on examination. Patient's blood pressure has improved. Patient's renal function is worsened and could be secondary to acute on chronic diastolic CHF exacerbation. Patient was -114.2 over the past 24 hours with 1.2 L urine output. Patient with worsening renal function. Will continue IV Lasix for now until patient is assessed by nephrology. May need to increase dose of Lasix.   #5 history of CVA Stable.  #6 hypothyroidism Continue home dose Synthroid.  #7 diabetes mellitus   hemoglobin A1c was 7.0 on 06/09/2014. Repeat a hemoglobin A1c. CBGs have ranged from 109-163. Continue Lantus to 46 units twice daily. Sliding scale insulin.  #8 hypertension Stable. Continue Norvasc and metoprolol.  #9 morbid obesity  #10 acute on chronic chronic kidney disease stage III May be secondary  to ATN secondary to sepsis versus secondary to a prerenal azotemia secondary to acute CHF exacerbation. Patient does look volume overloaded on exam.. Baseline creatinine about 2.0. Creatinine trending back up and now at 4.21 from 3.59 yesterday. Patient currently on Lasix 60 mg IV every 12 hours. May need to increase diuretics. Consult with nephrology for further evaluation and management.  #11 prophylaxis Heparin for DVT prophylaxis.  Code Status: DO NOT RESUSCITATE Family Communication: Updated patient at bedside. No family present. Disposition Plan: When medically stable, probably home with home health.   Consultants:  Orthopedics: Dr. Lajoyce Corners 04/06/2015  Procedures:  Chest x-ray 04/06/2015  Antibiotics:  IV Zosyn 04/06/2015>>>>> 04/11/2015  IV vancomycin 04/06/2015>>>> 04/07/2015  IV ciprofloxacin 04/10/2015 >>>> 04/10/2015  IV Elita Quick 04/11/2015  HPI/Subjective: Patient sitting up in chair. Patient denies shortness of breath. No chest pain. Patient states some decreased urine output.  Objective: Filed Vitals:   04/12/15 1411  BP: 135/47  Pulse: 83  Temp: 98 F (36.7 C)  Resp: 16    Intake/Output Summary (Last 24 hours) at 04/12/15 1430 Last data filed at 04/12/15 1406  Gross per 24 hour  Intake    810 ml  Output    700 ml  Net    110 ml   Filed Weights   04/10/15 0538 04/11/15 0628 04/12/15 0613  Weight: 133.267 kg (293 lb 12.8 oz) 133.3 kg (293 lb 14 oz) 133.4 kg (294 lb 1.5 oz)    Exam:   General:  NAD  Cardiovascular: RRR  Respiratory: Poor air movement. Bibasilar crackles.  Abdomen: Soft, nontender, nondistended, positive bowel sounds.  Musculoskeletal: No clubbing or cyanosis. Bilateral lower extremities wrapped. 2-3+ bilateral lower extremity edema up to hips.  Data Reviewed: Basic Metabolic Panel:  Recent Labs Lab 04/07/15 0250 04/08/15 0526 04/09/15 0550  04/10/15 0520 04/11/15 0546 04/12/15 0558  NA 133* 136 139 140 139 138  K 3.8  4.3 5.2* 4.5 4.3 4.2  CL 97* 99* 103 107 106 106  CO2 27 28 27 26 22 24   GLUCOSE 372* 95 158* 82 67 126*  BUN 81* 79* 82* 81* 82* 83*  CREATININE 3.00* 3.19* 3.47* 3.23* 3.59* 4.21*  CALCIUM 8.6* 8.8* 8.7* 8.7* 8.5* 8.6*  MG 1.9  --   --   --   --  2.3  PHOS 4.0  --   --   --   --  5.9*   Liver Function Tests:  Recent Labs Lab 04/06/15 1413 04/12/15 0558  AST 27  --   ALT 13*  --   ALKPHOS 96  --   BILITOT 1.6*  --   PROT 8.0  --   ALBUMIN 3.2* 2.5*   No results for input(s): LIPASE, AMYLASE in the last 168 hours. No results for input(s): AMMONIA in the last 168 hours. CBC:  Recent Labs Lab 04/06/15 1413  04/08/15 0526 04/09/15 0550 04/10/15 0520 04/11/15 0546 04/12/15 0558  WBC 42.6*  < > 14.6* 11.5* 10.3 9.7 11.6*  NEUTROABS 41.3*  --   --   --  8.3*  --   --   HGB 10.1*  < > 9.1* 9.1* 9.0* 9.1* 8.9*  HCT 31.0*  < > 28.5* 29.2* 29.0* 29.1* 29.0*  MCV 91.4  < > 94.1 95.7 93.9 93.6 92.9  PLT 221  < > 186 197 192 180 193  < > = values in this interval not displayed. Cardiac Enzymes:  Recent Labs Lab 04/06/15 1413  TROPONINI 0.06*   BNP (last 3 results)  Recent Labs  04/06/15 1413 04/08/15 0526  BNP 510.5* 127.0*    ProBNP (last 3 results)  Recent Labs  06/09/14 1818  PROBNP 4358.0*    CBG:  Recent Labs Lab 04/11/15 1155 04/11/15 1652 04/11/15 2131 04/12/15 0744 04/12/15 1206  GLUCAP 124* 94 137* 109* 163*    Recent Results (from the past 240 hour(s))  Culture, blood (routine x 2)     Status: None   Collection Time: 04/06/15  2:13 PM  Result Value Ref Range Status   Specimen Description BLOOD ARM RIGHT  Final   Special Requests BOTTLES DRAWN AEROBIC AND ANAEROBIC 5CC  Final   Culture  Setup Time   Final    GRAM NEGATIVE RODS AEROBIC BOTTLE ONLY CRITICAL RESULT CALLED TO, READ BACK BY AND VERIFIED WITH: Burnard Hawthorne, RN AT 1133 ON [id number] BY Lucienne Capers    Culture PSEUDOMONAS AERUGINOSA  Final   Report Status 04/09/2015 FINAL   Final   Organism ID, Bacteria PSEUDOMONAS AERUGINOSA  Final      Susceptibility   Pseudomonas aeruginosa - MIC*    CEFTAZIDIME 2 SENSITIVE Sensitive     CIPROFLOXACIN <=0.25 SENSITIVE Sensitive     GENTAMICIN <=1 SENSITIVE Sensitive     IMIPENEM 1 SENSITIVE Sensitive     PIP/TAZO <=4 SENSITIVE Sensitive     CEFEPIME 2 SENSITIVE Sensitive     * PSEUDOMONAS AERUGINOSA  Culture, Urine     Status: None   Collection Time: 04/06/15  2:52 PM  Result Value Ref Range Status   Specimen Description URINE, RANDOM  Final   Special Requests ADDED [id number]  Final   Culture MULTIPLE SPECIES PRESENT, SUGGEST RECOLLECTION  Final   Report Status 04/09/2015 FINAL  Final  Culture, blood (routine x 2)  Status: None   Collection Time: 04/06/15  3:00 PM  Result Value Ref Range Status   Specimen Description BLOOD LEFT HAND  Final   Special Requests BOTTLES DRAWN AEROBIC AND ANAEROBIC  Final   Culture NO GROWTH 5 DAYS  Final   Report Status 04/11/2015 FINAL  Final  MRSA PCR Screening     Status: Abnormal   Collection Time: 04/06/15 10:17 PM  Result Value Ref Range Status   MRSA by PCR POSITIVE (A) NEGATIVE Final    Comment:        The GeneXpert MRSA Assay (FDA approved for NASAL specimens only), is one component of a comprehensive MRSA colonization surveillance program. It is not intended to diagnose MRSA infection nor to guide or monitor treatment for MRSA infections. RESULT CALLED TO, READ BACK BY AND VERIFIED WITH: [facility] RN [id number] 04/07/15 MITCHELL,L      Studies: US Renal  04/12/2015   CLINICAL DATA:  Acute renal failure.  History of diabetes.  EXAM: RENAL / URINARY TRACT ULTRASOUND COMPLETE  COMPARISON:  CT of the abdomen and pelvis 06/10/2014  FINDINGS: Right Kidney:  Length: 10.1 cm. There is poor visualization of the right kidney. No hydronephrosis or mass identified.  Left Kidney:  Length: 13.2 cm. Echogenicity within normal limits. No mass or hydronephrosis visualized.   Bladder:  Appears normal for degree of bladder distention. Study is technically compromised by patient body habitus.  IMPRESSION: No hydronephrosis.   Electronically Signed   By: [name] M.D.   On: 04/12/2015 10:29    Scheduled Meds: . amLODipine  5 mg Oral Daily  . aspirin EC  81 mg Oral Daily  . cefTAZidime (FORTAZ)  IV  2 g Intravenous Q24H  . furosemide  60 mg Intravenous Q12H  . heparin  5,000 Units Subcutaneous 3 times per day  . insulin aspart  2-6 Units Subcutaneous TID WC  . insulin aspart  6 Units Subcutaneous TID WC  . insulin glargine  46 Units Subcutaneous BID  . levothyroxine  75 mcg Oral QAC breakfast  . metoprolol succinate  25 mg Oral Daily  . pantoprazole  40 mg Oral Q0600   Continuous Infusions:    Principal Problem:   Severe sepsis Active Problems:   DM (diabetes mellitus), type 2 with renal complications   Essential hypertension   Peripheral vascular disease   CKD (chronic kidney disease), stage III   On home oxygen therapy   Hypothyroidism   Morbid obesity   Pressure ulcer   Acute exacerbation of CHF (congestive heart failure)   Diabetic foot infection   UTI (urinary tract infection)   Blood poisoning   Acute on chronic diastolic heart failure   Renal failure (ARF), acute on chronic    Time spent: 35 mins    [facility] MD [facility] Pager [phone]. If 7PM-7AM, please contact night-coverage at [email or web address], password [facility] 04/12/2015, 2:30 PM  LOS: 6 days

## 2015-04-12 NOTE — Consult Note (Signed)
[facility] Renal Consultation Note  Requesting MD: Grandville Silos Indication for Consultation: A on CRF  HPI:  Kimberly [name] is a 59 y.o. female with past medical history significant for diastolic heart failure, COPD and vascular disease as well as hypertension, diabetes and coronary artery disease. She is also noted to have CKD with creatinine lately in the twos with last creatinine in the system 2.92 and January 2016. She presented to [facility] ER with worsening right heel pain on July 26 , she is noted to have chronic foot wound issues and has been followed at a wound clinic.  She was felt to have sepsis related to this foot as well as volume overload- started on vancomycin and Zosyn.  Her presenting creatinine was 3.29 which initially improved to 3.0 but now has progressively worsened daily and is at a level of 4.21 today and that is the reason for consult. There've been no obviously low blood pressures recorded. She was slightly oliguric but urine output has increased and is was noted to be 1200 over the last 24 hours.  Initial urine looked like possible UTI but culture just showed multiple organisms. Urine sodium was noted to be less than 10 on July 30. Renal ultrasound is pending. Patient was first admitted by CCM discussions were held regarding the limits of care. In that note it was stated that patient would not desire dialysis therapy.  She tells me that she has received dialysis before but has concerns that she is not strong enough at this time to withstand dialysis but would not outright refuse it.   CREATININE, SER  Date/Time Value Ref Range Status  04/12/2015 05:58 AM 4.21* 0.44 - 1.00 mg/dL Final  04/11/2015 05:46 AM 3.59* 0.44 - 1.00 mg/dL Final  04/10/2015 05:20 AM 3.23* 0.44 - 1.00 mg/dL Final  04/09/2015 05:50 AM 3.47* 0.44 - 1.00 mg/dL Final  04/08/2015 05:26 AM 3.19* 0.44 - 1.00 mg/dL Final  04/07/2015 02:50 AM 3.00* 0.44 - 1.00 mg/dL Final  04/06/2015 02:13 PM 3.29*  0.44 - 1.00 mg/dL Final  09/15/2014 09:35 AM 2.92* 0.50 - 1.10 mg/dL Final  09/01/2014 12:17 PM 2.12* 0.50 - 1.10 mg/dL Final  08/25/2014 05:19 PM 2.0* 0.4 - 1.2 mg/dL Final  07/05/2014 04:43 AM 2.04* 0.50 - 1.10 mg/dL Final  07/05/2014 12:57 AM 2.08* 0.50 - 1.10 mg/dL Final  07/04/2014 05:29 AM 2.00* 0.50 - 1.10 mg/dL Final  07/03/2014 05:15 AM 1.97* 0.50 - 1.10 mg/dL Final  07/02/2014 05:37 AM 1.92* 0.50 - 1.10 mg/dL Final  07/01/2014 04:33 AM 2.00* 0.50 - 1.10 mg/dL Final  06/30/2014 04:53 AM 1.93* 0.50 - 1.10 mg/dL Final  06/29/2014 04:40 AM 1.88* 0.50 - 1.10 mg/dL Final  06/28/2014 05:06 AM 1.72* 0.50 - 1.10 mg/dL Final  06/27/2014 05:00 AM 1.84* 0.50 - 1.10 mg/dL Final    Comment:    DELTA CHECK NOTED  06/26/2014 04:00 AM 1.07 0.50 - 1.10 mg/dL Final  06/25/2014 04:30 PM 1.03 0.50 - 1.10 mg/dL Final  06/25/2014 03:15 AM 1.19* 0.50 - 1.10 mg/dL Final  06/24/2014 03:55 PM 1.34* 0.50 - 1.10 mg/dL Final  06/24/2014 04:20 AM 1.76* 0.50 - 1.10 mg/dL Final  06/23/2014 04:00 PM 2.55* 0.50 - 1.10 mg/dL Final  06/23/2014 05:00 AM 2.85* 0.50 - 1.10 mg/dL Final  06/22/2014 06:04 AM 2.93* 0.50 - 1.10 mg/dL Final  06/21/2014 05:00 AM 2.87* 0.50 - 1.10 mg/dL Final  06/20/2014 04:33 AM 2.87* 0.50 - 1.10 mg/dL Final  06/19/2014 02:35 AM 2.97* 0.50 -  1.10 mg/dL Final  06/18/2014 04:40 AM 3.09* 0.50 - 1.10 mg/dL Final  06/17/2014 01:50 PM 3.22* 0.50 - 1.10 mg/dL Final  06/16/2014 04:50 AM 3.25* 0.50 - 1.10 mg/dL Final  06/15/2014 10:10 PM 3.20* 0.50 - 1.10 mg/dL Final  06/15/2014 03:15 AM 3.08* 0.50 - 1.10 mg/dL Final  06/14/2014 04:09 AM 2.55* 0.50 - 1.10 mg/dL Final  06/13/2014 04:04 AM 1.95* 0.50 - 1.10 mg/dL Final  06/12/2014 04:00 AM 2.00* 0.50 - 1.10 mg/dL Final  06/11/2014 05:02 AM 2.06* 0.50 - 1.10 mg/dL Final  06/10/2014 04:00 AM 2.19* 0.50 - 1.10 mg/dL Final  06/09/2014 06:18 PM 2.27* 0.50 - 1.10 mg/dL Final  10/15/2013 06:54 AM 1.89* 0.50 - 1.10 mg/dL Final  10/14/2013 06:55 AM  1.81* 0.50 - 1.10 mg/dL Final  10/13/2013 07:16 PM 1.77* 0.50 - 1.10 mg/dL Final  09/14/2013 05:18 AM 1.73* 0.50 - 1.10 mg/dL Final  09/13/2013 04:40 AM 1.78* 0.50 - 1.10 mg/dL Final  09/12/2013 04:26 AM 2.28* 0.50 - 1.10 mg/dL Final  09/11/2013 01:00 AM 1.90* 0.50 - 1.10 mg/dL Final  09/10/2013 02:41 PM 1.74* 0.50 - 1.10 mg/dL Final  09/15/2012 09:23 AM 1.40* 0.50 - 1.10 mg/dL Final  09/15/2012 09:15 AM 1.32* 0.50 - 1.10 mg/dL Final     PMHx:   Past Medical History  Diagnosis Date  . HYPERLIPIDEMIA   . HYPERTENSION   . CAD, NATIVE VESSEL     May 10, 2010 cath showed a hyperdynamic LV function, she had dominant circumflex anatomy with a 70-80% small OM1. She had diffuse diabetic plaque particularly in the distal LAD. She nondominant RCA.  Nondominant  . PVD     CEA  . COPD   . Edema   . CHF (congestive heart failure)     Preserved EF  . Cellulitis 10/15/2013    BILATERAL  . Diabetic neuropathy   . Anasarca 05/2014  . Type II diabetes mellitus   . Proliferative retinopathy     hx/notes 01/27/2010  . Peripheral neuropathy     hx/notes 01/27/2010  . Complication of anesthesia     "they had trouble reviving me after the latest carotid OR" (04/06/2015)  . Heart attack     "on the table when I had my last carotid OR" (04/06/2015)  . Asthma   . On home oxygen therapy     "2.5L; 24/7" (04/06/2015)  . Pneumonia "several times"  . Chronic bronchitis     "often; usually q yr" (04/06/2015)  . Hypothyroidism   . History of hiatal hernia   . GERD (gastroesophageal reflux disease)   . 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 Arthritis     "aches and pains all over" (04/06/2015)  . Anxiety   . Depression   . Chronic kidney disease     "they've had me on dialysis periodically" (04/06/2015)  . Paralyzed vocal cords     Past Surgical History  Procedure Laterality Date  .  Eye surgery    . Carotid endarterectomy Left X 2  . Vitrectomy Bilateral   . Cataract extraction w/ intraocular lens  implant, bilateral Bilateral     Family Hx:  Family History  Problem Relation Age of Onset  . Cancer Mother     Breast, NHL  . Stroke Mother   . Peripheral vascular disease Father   . CAD Father 51  . Heart  attack Father   . Hypertension Father   . Asthma Father     Social History:  reports that she quit smoking about 9 years ago. Her smoking use included Cigarettes. She has a 20 pack-year smoking history. She has never used smokeless tobacco. She reports that she does not drink alcohol or use illicit drugs.  Allergies:  Allergies  Allergen Reactions  . Ciprofloxacin Itching  . Epinephrine     Increased heart rate    Medications: Prior to Admission medications   Medication Sig Start Date End Date Taking? Authorizing Provider  acetaminophen (TYLENOL) 325 MG tablet Take 650 mg by mouth every 6 (six) hours as needed for mild pain.   Yes Historical Provider, MD  albuterol (PROVENTIL HFA;VENTOLIN HFA) 108 (90 BASE) MCG/ACT inhaler Inhale 1-2 puffs into the lungs every 6 (six) hours as needed for wheezing or shortness of breath.   Yes Historical Provider, MD  amLODipine (NORVASC) 5 MG tablet TAKE 1 TABLET BY MOUTH EVERY DAY 08/14/14  Yes Tiffany L Reed, DO  aspirin EC 81 MG tablet Take 81 mg by mouth daily.   Yes Historical Provider, MD  insulin aspart (NOVOLOG) 100 UNIT/ML injection Inject 6 Units into the skin 3 (three) times daily with meals. 07/05/14  Yes Domenic Polite, MD  insulin glargine (LANTUS) 100 UNIT/ML injection Inject 60 Units into the skin 2 (two) times daily.    Yes Historical Provider, MD  levothyroxine (SYNTHROID, LEVOTHROID) 75 MCG tablet Take 75 mcg by mouth daily before breakfast.   Yes Historical Provider, MD  metolazone (ZAROXOLYN) 5 MG tablet TAKE 1 TABLET BY MOUTH TWICE A WEEK ([place]) 01/12/15  Yes Jolaine Artist, MD  metoprolol  succinate (TOPROL-XL) 50 MG 24 hr tablet Take 1 and 1/2 tabs daily = 75 mg daily Patient taking differently: 75 mg. Take 1 and 1/2 tabs daily = 75 mg daily 07/30/14  Yes Scott T Kathlen Mody, PA-C  Multiple Vitamins-Minerals (MULTIVITAMIN PO) Take 1 tablet by mouth daily.   Yes Historical Provider, MD  potassium chloride SA (KLOR-CON M20) 20 MEQ tablet Take 2 tablets (40 mEq total) by mouth 2 (two) times daily. 09/18/14  Yes Lelon Perla, MD  torsemide (DEMADEX) 20 MG tablet Take 3 tablets by mouth twice a day, Take an extra 20 mg if weight increase 3 lbs in one day 12/29/14  Yes Lelon Perla, MD    I have reviewed the patient's current medications.  Labs:  Results for orders placed or performed during the hospital encounter of 04/06/15 (from the past 48 hour(s))  Glucose, capillary     Status: Abnormal   Collection Time: 04/10/15  4:43 PM  Result Value Ref Range   Glucose-Capillary 139 (H) 65 - 99 mg/dL  Glucose, capillary     Status: Abnormal   Collection Time: 04/10/15 10:22 PM  Result Value Ref Range   Glucose-Capillary 109 (H) 65 - 99 mg/dL  CBC     Status: Abnormal   Collection Time: 04/11/15  5:46 AM  Result Value Ref Range   WBC 9.7 4.0 - 10.5 K/uL   RBC 3.11 (L) 3.87 - 5.11 MIL/uL   Hemoglobin 9.1 (L) 12.0 - 15.0 g/dL   HCT 29.1 (L) 36.0 - 46.0 %   MCV 93.6 78.0 - 100.0 fL   MCH 29.3 26.0 - 34.0 pg   MCHC 31.3 30.0 - 36.0 g/dL   RDW 15.9 (H) 11.5 - 15.5 %   Platelets 180 150 - 400 K/uL  Basic metabolic panel     Status: Abnormal   Collection Time: 04/11/15  5:46 AM  Result Value Ref Range   Sodium 139 135 - 145 mmol/L   Potassium 4.3 3.5 - 5.1 mmol/L   Chloride 106 101 - 111 mmol/L   CO2 22 22 - 32 mmol/L   Glucose, Bld 67 65 - 99 mg/dL   BUN 82 (H) 6 - 20 mg/dL   Creatinine, Ser 3.59 (H) 0.44 - 1.00 mg/dL   Calcium 8.5 (L) 8.9 - 10.3 mg/dL   GFR calc non Af Amer 13 (L) >60 mL/min   GFR calc Af Amer 15 (L) >60 mL/min    Comment: (NOTE) The eGFR has been calculated  using the CKD EPI equation. This calculation has not been validated in all clinical situations. eGFR's persistently <60 mL/min signify possible Chronic Kidney Disease.    Anion gap 11 5 - 15  Glucose, capillary     Status: None   Collection Time: 04/11/15  6:52 AM  Result Value Ref Range   Glucose-Capillary 68 65 - 99 mg/dL  Glucose, capillary     Status: None   Collection Time: 04/11/15  8:25 AM  Result Value Ref Range   Glucose-Capillary 94 65 - 99 mg/dL  Glucose, capillary     Status: Abnormal   Collection Time: 04/11/15 11:55 AM  Result Value Ref Range   Glucose-Capillary 124 (H) 65 - 99 mg/dL  Glucose, capillary     Status: None   Collection Time: 04/11/15  4:52 PM  Result Value Ref Range   Glucose-Capillary 94 65 - 99 mg/dL  Glucose, capillary     Status: Abnormal   Collection Time: 04/11/15  9:31 PM  Result Value Ref Range   Glucose-Capillary 137 (H) 65 - 99 mg/dL   Comment 1 Notify RN    Comment 2 Document in Chart   CBC     Status: Abnormal   Collection Time: 04/12/15  5:58 AM  Result Value Ref Range   WBC 11.6 (H) 4.0 - 10.5 K/uL   RBC 3.12 (L) 3.87 - 5.11 MIL/uL   Hemoglobin 8.9 (L) 12.0 - 15.0 g/dL   HCT 29.0 (L) 36.0 - 46.0 %   MCV 92.9 78.0 - 100.0 fL   MCH 28.5 26.0 - 34.0 pg   MCHC 30.7 30.0 - 36.0 g/dL   RDW 16.0 (H) 11.5 - 15.5 %   Platelets 193 150 - 400 K/uL  Magnesium     Status: None   Collection Time: 04/12/15  5:58 AM  Result Value Ref Range   Magnesium 2.3 1.7 - 2.4 mg/dL  Renal function panel     Status: Abnormal   Collection Time: 04/12/15  5:58 AM  Result Value Ref Range   Sodium 138 135 - 145 mmol/L   Potassium 4.2 3.5 - 5.1 mmol/L   Chloride 106 101 - 111 mmol/L   CO2 24 22 - 32 mmol/L   Glucose, Bld 126 (H) 65 - 99 mg/dL   BUN 83 (H) 6 - 20 mg/dL   Creatinine, Ser 4.21 (H) 0.44 - 1.00 mg/dL   Calcium 8.6 (L) 8.9 - 10.3 mg/dL   Phosphorus 5.9 (H) 2.5 - 4.6 mg/dL   Albumin 2.5 (L) 3.5 - 5.0 g/dL   GFR calc non Af Amer 11 (L) >60  mL/min   GFR calc Af Amer 12 (L) >60 mL/min    Comment: (NOTE) The eGFR has been calculated using the CKD EPI equation. This calculation has  not been validated in all clinical situations. eGFR's persistently <60 mL/min signify possible Chronic Kidney Disease.    Anion gap 8 5 - 15  Glucose, capillary     Status: Abnormal   Collection Time: 04/12/15  7:44 AM  Result Value Ref Range   Glucose-Capillary 109 (H) 65 - 99 mg/dL  Glucose, capillary     Status: Abnormal   Collection Time: 04/12/15 12:06 PM  Result Value Ref Range   Glucose-Capillary 163 (H) 65 - 99 mg/dL     ROS:  A comprehensive review of systems was negative except for: Constitutional: positive for fatigue Cardiovascular: positive for lower extremity edema  Physical Exam: Filed Vitals:   04/12/15 1411  BP: 135/47  Pulse: 83  Temp: 98 F (36.7 C)  Resp: 16     General: obese, talkative- NAD but mobility severely limited HEENT: PERRLA, EOMI, mucous membranes moist Neck: positive for JVD Heart: RRR Lungs: decreased BS at bases Abdomen: distended , obese, non tender Extremities: upper thighs with pitting edema- lower legs wrapped- per pt were weeping when bandages were changed Skin: warm and dry Neuro: globally intact but very weak   Assessment/Plan: 59 year old female with multiple medical issues presenting with sepsis related to foot wound- also has developed acute on chronic renal failure this hospitalization 1.Renal- acute on chronic renal failure.  Although no significant hypotension recorded I still suspect possible ATN due to initial septic episode versus just some other hemodynamic compromise due to her cardiac and vascular disease. Fortunately, it appears that her urine output is picking up however her creatinine has not yet plateaued. I agree that she is volume overloaded so I agree with discontinuing IV fluids and starting diuretics.  I will also stop the Norvasc so that her blood pressure does not drop  too low 2. Hypertension/volume  - volume overloaded but with a reasonable blood pressure. We'll stop Norvasc and have instituted diuretics- will increase the dose  3. Anemia  - hemoglobin low and likely not helping. We'll check iron stores and add ESA 4. Malnutrition- with decreased albumin also is not helping volume status- consider protein supplement 5. Dispo- I agree with her that instituting and maintaining chronic dialysis for her would be very difficult given her basically nonexistent mobility over the last 6 mos- bed to chair only with assist.  I sincerely hope that it will not come to that for her   Darrly Loberg A 04/12/2015, 3:43 PM

## 2015-04-13 LAB — RENAL FUNCTION PANEL
ALBUMIN: 2.8 g/dL — AB (ref 3.5–5.0)
ANION GAP: 14 (ref 5–15)
BUN: 84 mg/dL — ABNORMAL HIGH (ref 6–20)
CALCIUM: 9 mg/dL (ref 8.9–10.3)
CO2: 23 mmol/L (ref 22–32)
Chloride: 101 mmol/L (ref 101–111)
Creatinine, Ser: 4.39 mg/dL — ABNORMAL HIGH (ref 0.44–1.00)
GFR calc Af Amer: 12 mL/min — ABNORMAL LOW (ref >60)
GFR, EST NON AFRICAN AMERICAN: 10 mL/min — AB (ref >60)
GLUCOSE: 98 mg/dL (ref 65–99)
Phosphorus: 6 mg/dL — ABNORMAL HIGH (ref 2.5–4.6)
Potassium: 3.6 mmol/L (ref 3.5–5.1)
SODIUM: 138 mmol/L (ref 135–145)

## 2015-04-13 LAB — GLUCOSE, CAPILLARY
GLUCOSE-CAPILLARY: 158 mg/dL — AB (ref 65–99)
GLUCOSE-CAPILLARY: 96 mg/dL (ref 65–99)
Glucose-Capillary: 98 mg/dL (ref 65–99)
Glucose-Capillary: 89 mg/dL (ref 65–99)
Glucose-Capillary: 80 mg/dL (ref 65–99)

## 2015-04-13 LAB — IRON AND TIBC
IRON: 45 ug/dL (ref 28–170)
Saturation Ratios: 15 % (ref 10.4–31.8)
TIBC: 298 ug/dL (ref 250–450)
UIBC: 253 ug/dL

## 2015-04-13 LAB — FERRITIN: FERRITIN: 98 ng/mL (ref 11–307)

## 2015-04-13 MED ORDER — SODIUM CHLORIDE 0.9 % IV SOLN
125.0000 mg | Freq: Every day | INTRAVENOUS | Status: AC
  Administered 2015-04-13 – 2015-04-15 (×3): 125 mg via INTRAVENOUS
  Filled 2015-04-13 (×5): qty 10

## 2015-04-13 MED ORDER — PRO-STAT SUGAR FREE PO LIQD
30.0000 mL | Freq: Two times a day (BID) | ORAL | Status: DC
  Administered 2015-04-13 – 2015-04-20 (×13): 30 mL via ORAL
  Filled 2015-04-13 (×15): qty 30

## 2015-04-13 NOTE — Progress Notes (Signed)
[facility] PROGRESS NOTE  ZEYNEP FANTROY ZOX:[id number] DOB: 06-12-1956 DOA: 04/06/2015 PCP: [name], MD  Assessment/Plan: #1 severe sepsis secondary to bilateral heel wounds/and lower extremity cellulitis and urinary tract infection.  Patient currently afebrile. Blood pressure has improved. Urine cultures with multiple species. Blood cultures 1/2 with Pseudomonas likely a contaminant. Patient with clinical improvement. Changed empiric IV Zosyn to IV Elita Quick as patient could not tolerate IV ciprofloxacin. Profore  compression wraps to bilateral lower extremities. heel floaters. Patient has been seen in consultation by orthopedics who recommended IV antibiotics, compression wraps, outpatient follow-up. Patient will follow-up with the wound care center on discharge. Follow.  #2 urinary tract infection Multiple species present.   #3 bilateral heel wounds/lower extremity cellulitis See problem #1.  #4 probable acute on chronic diastolic CHF exacerbation Patient on admission was hypotensive and as such could not be diuresed. Patient's blood pressure responded to fluids. Patient looks volume overloaded on examination. Patient's blood pressure has improved. Patient's renal function is worsened and could be secondary to acute on chronic diastolic CHF exacerbation. Patient was -414.2 over the past 24 hours with 1.2 L urine output. Patient with worsening renal function which seems to have plateued. IV Lasix doses have been adjusted per nephrology. Continue current dose. Follow.   #5 history of CVA Stable.  #6 hypothyroidism Continue home dose Synthroid.  #7 diabetes mellitus   hemoglobin A1c was 7.0 on 06/09/2014. Repeat a hemoglobin A1c. CBGs have ranged from 80-158. Continue Lantus to 46 units twice daily. Sliding scale insulin.  #8 hypertension Stable. Continue metoprolol. Norvasc has been discontinued to increase renal perfusion.  #9 morbid obesity  #10 acute on chronic  chronic kidney disease stage III May be secondary to ATN secondary to sepsis versus secondary to a prerenal azotemia secondary to acute CHF exacerbation. Patient does look volume overloaded on exam.. Baseline creatinine about 2.0. Creatinine trending back up and now at 4.39 from 4.21 from 3.59 yesterday. Renal function seems to be plateauing. Patient's Lasix has been increased per nephrology to 120 mg IV every 8 hours. Nephrology following and appreciate input and recommendations.   #11 prophylaxis Heparin for DVT prophylaxis.  Code Status: DO NOT RESUSCITATE Family Communication: Updated patient at bedside. No family present. Disposition Plan: When renal function has improved and close to baseline, probably home with home health.   Consultants:  Orthopedics: Dr. Lajoyce Corners 04/06/2015  Nephrology: Dr. Kathrene Bongo 04/12/2015  Procedures:  Chest x-ray 04/06/2015  Antibiotics:  IV Zosyn 04/06/2015>>>>> 04/11/2015  IV vancomycin 04/06/2015>>>> 04/07/2015  IV ciprofloxacin 04/10/2015 >>>> 04/10/2015  IV Elita Quick 04/11/2015  HPI/Subjective: Patient sitting up in chair. Patient with some complaints of shortness of breath. Patient states she is making urine.    Objective: Filed Vitals:   04/13/15 1400  BP: 146/81  Pulse: 95  Temp: 98.1 F (36.7 C)  Resp: 16    Intake/Output Summary (Last 24 hours) at 04/13/15 1734 Last data filed at 04/13/15 1538  Gross per 24 hour  Intake    926 ml  Output   4200 ml  Net  -3274 ml   Filed Weights   04/11/15 0628 04/12/15 0613 04/13/15 0449  Weight: 133.3 kg (293 lb 14 oz) 133.4 kg (294 lb 1.5 oz) 133 kg (293 lb 3.4 oz)    Exam:   General:  NAD  Cardiovascular: RRR  Respiratory: Poor air movement. Bibasilar crackles.  Abdomen: Soft, nontender, nondistended, positive bowel sounds.  Musculoskeletal: No clubbing or cyanosis. Bilateral lower extremities wrapped. 2-3+ bilateral lower  extremity edema up to hips.  Data Reviewed: Basic  Metabolic Panel:  Recent Labs Lab 04/07/15 0250  04/09/15 0550 04/10/15 0520 04/11/15 0546 04/12/15 0558 04/13/15 0533  NA 133*  < > 139 140 139 138 138  K 3.8  < > 5.2* 4.5 4.3 4.2 3.6  CL 97*  < > 103 107 106 106 101  CO2 27  < > GLUCOSE 372*  < > 158* 82 67 126* 98  BUN 81*  < > 82* 81* 82* 83* 84*  CREATININE 3.00*  < > 3.47* 3.23* 3.59* 4.21* 4.39*  CALCIUM 8.6*  < > 8.7* 8.7* 8.5* 8.6* 9.0  MG 1.9  --   --   --   --  2.3  --   PHOS 4.0  --   --   --   --  5.9* 6.0*  < > = values in this interval not displayed. Liver Function Tests:  Recent Labs Lab 04/12/15 0558 04/13/15 0533  ALBUMIN 2.5* 2.8*   No results for input(s): LIPASE, AMYLASE in the last 168 hours. No results for input(s): AMMONIA in the last 168 hours. CBC:  Recent Labs Lab 04/08/15 0526 04/09/15 0550 04/10/15 0520 04/11/15 0546 04/12/15 0558  WBC 14.6* 11.5* 10.3 9.7 11.6*  NEUTROABS  --   --  8.3*  --   --   HGB 9.1* 9.1* 9.0* 9.1* 8.9*  HCT 28.5* 29.2* 29.0* 29.1* 29.0*  MCV 94.1 95.7 93.9 93.6 92.9  PLT 186 197 192 180 193   Cardiac Enzymes: No results for input(s): CKTOTAL, CKMB, CKMBINDEX, TROPONINI in the last 168 hours. BNP (last 3 results)  Recent Labs  04/06/15 1413 04/08/15 0526  BNP 510.5* 127.0*    ProBNP (last 3 results)  Recent Labs  06/09/14 1818  PROBNP 4358.0*    CBG:  Recent Labs Lab 04/12/15 2133 04/13/15 0059 04/13/15 0805 04/13/15 1213 04/13/15 1711  GLUCAP 120* 98 89 158* 80    Recent Results (from the past 240 hour(s))  Culture, blood (routine x 2)     Status: None   Collection Time: 04/06/15  2:13 PM  Result Value Ref Range Status   Specimen Description BLOOD ARM RIGHT  Final   Special Requests BOTTLES DRAWN AEROBIC AND ANAEROBIC 5CC  Final   Culture  Setup Time   Final    GRAM NEGATIVE RODS AEROBIC BOTTLE ONLY CRITICAL RESULT CALLED TO, READ BACK BY AND VERIFIED WITH: Burnard Hawthorne, RN AT 1133 ON [id number] BY Lucienne Capers     Culture PSEUDOMONAS AERUGINOSA  Final   Report Status 04/09/2015 FINAL  Final   Organism ID, Bacteria PSEUDOMONAS AERUGINOSA  Final      Susceptibility   Pseudomonas aeruginosa - MIC*    CEFTAZIDIME 2 SENSITIVE Sensitive     CIPROFLOXACIN <=0.25 SENSITIVE Sensitive     GENTAMICIN <=1 SENSITIVE Sensitive     IMIPENEM 1 SENSITIVE Sensitive     PIP/TAZO <=4 SENSITIVE Sensitive     CEFEPIME 2 SENSITIVE Sensitive     * PSEUDOMONAS AERUGINOSA  Culture, Urine     Status: None   Collection Time: 04/06/15  2:52 PM  Result Value Ref Range Status   Specimen Description URINE, RANDOM  Final   Special Requests ADDED [id number]  Final   Culture MULTIPLE SPECIES PRESENT, SUGGEST RECOLLECTION  Final   Report Status 04/09/2015 FINAL  Final  Culture, blood (routine x 2)     Status: None   Collection  Time: 04/06/15  3:00 PM  Result Value Ref Range Status   Specimen Description BLOOD LEFT HAND  Final   Special Requests BOTTLES DRAWN AEROBIC AND ANAEROBIC  Final   Culture NO GROWTH 5 DAYS  Final   Report Status 04/11/2015 FINAL  Final  MRSA PCR Screening     Status: Abnormal   Collection Time: 04/06/15 10:17 PM  Result Value Ref Range Status   MRSA by PCR POSITIVE (A) NEGATIVE Final    Comment:        The GeneXpert MRSA Assay (FDA approved for NASAL specimens only), is one component of a comprehensive MRSA colonization surveillance program. It is not intended to diagnose MRSA infection nor to guide or monitor treatment for MRSA infections. RESULT CALLED TO, READ BACK BY AND VERIFIED WITH: [facility] RN [id number] 04/07/15 MITCHELL,L      Studies: US Renal  04/12/2015   CLINICAL DATA:  Acute renal failure.  History of diabetes.  EXAM: RENAL / URINARY TRACT ULTRASOUND COMPLETE  COMPARISON:  CT of the abdomen and pelvis 06/10/2014  FINDINGS: Right Kidney:  Length: 10.1 cm. There is poor visualization of the right kidney. No hydronephrosis or mass identified.  Left Kidney:  Length: 13.2 cm.  Echogenicity within normal limits. No mass or hydronephrosis visualized.  Bladder:  Appears normal for degree of bladder distention. Study is technically compromised by patient body habitus.  IMPRESSION: No hydronephrosis.   Electronically Signed   By: [name] M.D.   On: 04/12/2015 10:29    Scheduled Meds: . aspirin EC  81 mg Oral Daily  . cefTAZidime (FORTAZ)  IV  2 g Intravenous Q24H  . darbepoetin (ARANESP) injection - NON-DIALYSIS  150 mcg Subcutaneous Q Tue-1800  . feeding supplement (PRO-STAT SUGAR FREE 64)  30 mL Oral BID  . ferric gluconate (FERRLECIT/NULECIT) IV  125 mg Intravenous Daily  . furosemide  120 mg Intravenous Q8H  . heparin  5,000 Units Subcutaneous 3 times per day  . insulin aspart  2-6 Units Subcutaneous TID WC  . insulin aspart  6 Units Subcutaneous TID WC  . insulin glargine  46 Units Subcutaneous BID  . levothyroxine  75 mcg Oral QAC breakfast  . metoprolol succinate  25 mg Oral Daily  . pantoprazole  40 mg Oral Q0600   Continuous Infusions:    Principal Problem:   Severe sepsis Active Problems:   DM (diabetes mellitus), type 2 with renal complications   Essential hypertension   Peripheral vascular disease   CKD (chronic kidney disease), stage III   On home oxygen therapy   Hypothyroidism   Morbid obesity   Pressure ulcer   Acute exacerbation of CHF (congestive heart failure)   Diabetic foot infection   UTI (urinary tract infection)   Blood poisoning   Acute on chronic diastolic heart failure   Renal failure (ARF), acute on chronic   ARF (acute renal failure)    Time spent: 35 mins    [facility] MD [facility] Pager [phone]. If 7PM-7AM, please contact night-coverage at [email or web address], password [facility] 04/13/2015, 5:34 PM  LOS: 7 days

## 2015-04-13 NOTE — Progress Notes (Signed)
Subjective:  UOP up- creatinine stable Objective Vital signs in last 24 hours: Filed Vitals:   04/12/15 1411 04/12/15 1501 04/12/15 2134 04/13/15 0449  BP: 135/47  136/48 146/62  Pulse: 83 89 82 87  Temp: 98 F (36.7 C)  97.9 F (36.6 C) 97.8 F (36.6 C)  TempSrc: Oral  Oral Oral  Resp: 16  22   Height:      Weight:    133 kg (293 lb 3.4 oz)  SpO2: 95% 86% 98% 96%   Weight change: -0.4 kg (-14.1 oz)  Intake/Output Summary (Last 24 hours) at 04/13/15 1048 Last data filed at 04/13/15 0920  Gross per 24 hour  Intake    926 ml  Output   2400 ml  Net  -1474 ml     Assessment/Plan: 59 year old female with multiple medical issues presenting with sepsis related to foot wound- also has developed acute on chronic renal failure this hospitalization 1.Renal- acute on chronic renal failure. Although no significant hypotension recorded I still suspect possible ATN due to initial septic episode versus just some other hemodynamic compromise due to her cardiac and vascular disease. Fortunately, it appears that her urine output is picking up however her creatinine is trying to plateau. Continue lasix at current dose 2. Hypertension/volume - volume overloaded but with a reasonable blood pressure. Have stoppedvNorvasc and have instituted diuretics-  3. Anemia - hemoglobin low and likely not helping. Iron stores low, will replete  and have added ESA 4. Malnutrition- with decreased albumin also is not helping volume status- added protein supplement 5. Dispo- I agree with her that instituting and maintaining chronic dialysis for her would be very difficult given her basically nonexistent mobility over the last 6 mos- bed to chair only with assist. I sincerely hope that it will not come to that for her    Marico Buckle A    Labs: Basic Metabolic Panel:  Recent Labs Lab 04/07/15 0250  04/11/15 0546 04/12/15 0558 04/13/15 0533  NA 133*  < > 139 138 138  K 3.8  < > 4.3 4.2 3.6  CL  97*  < > 106 106 101  CO2 27  < > GLUCOSE 372*  < > 67 126* 98  BUN 81*  < > 82* 83* 84*  CREATININE 3.00*  < > 3.59* 4.21* 4.39*  CALCIUM 8.6*  < > 8.5* 8.6* 9.0  PHOS 4.0  --   --  5.9* 6.0*  < > = values in this interval not displayed. Liver Function Tests:  Recent Labs Lab 04/06/15 1413 04/12/15 0558 04/13/15 0533  AST 27  --   --   ALT 13*  --   --   ALKPHOS 96  --   --   BILITOT 1.6*  --   --   PROT 8.0  --   --   ALBUMIN 3.2* 2.5* 2.8*   No results for input(s): LIPASE, AMYLASE in the last 168 hours. No results for input(s): AMMONIA in the last 168 hours. CBC:  Recent Labs Lab 04/06/15 1413  04/08/15 0526 04/09/15 0550 04/10/15 0520 04/11/15 0546 04/12/15 0558  WBC 42.6*  < > 14.6* 11.5* 10.3 9.7 11.6*  NEUTROABS 41.3*  --   --   --  8.3*  --   --   HGB 10.1*  < > 9.1* 9.1* 9.0* 9.1* 8.9*  HCT 31.0*  < > 28.5* 29.2* 29.0* 29.1* 29.0*  MCV 91.4  < > 94.1 95.7 93.9 93.6 92.9  PLT 221  < > 186 197 192 180 193  < > = values in this interval not displayed. Cardiac Enzymes:  Recent Labs Lab 04/06/15 1413  TROPONINI 0.06*   CBG:  Recent Labs Lab 04/12/15 1206 04/12/15 1708 04/12/15 2133 04/13/15 0059 04/13/15 0805  GLUCAP 163* 98 120* 98 89    Iron Studies:  Recent Labs  04/13/15 0553  IRON 45  TIBC 298  FERRITIN 98   Studies/Results: US Renal  04/12/2015   CLINICAL DATA:  Acute renal failure.  History of diabetes.  EXAM: RENAL / URINARY TRACT ULTRASOUND COMPLETE  COMPARISON:  CT of the abdomen and pelvis 06/10/2014  FINDINGS: Right Kidney:  Length: 10.1 cm. There is poor visualization of the right kidney. No hydronephrosis or mass identified.  Left Kidney:  Length: 13.2 cm. Echogenicity within normal limits. No mass or hydronephrosis visualized.  Bladder:  Appears normal for degree of bladder distention. Study is technically compromised by patient body habitus.  IMPRESSION: No hydronephrosis.   Electronically Signed   By: [name] M.D.   On: 04/12/2015 10:29   Medications: Infusions:    Scheduled Medications: . aspirin EC  81 mg Oral Daily  . cefTAZidime (FORTAZ)  IV  2 g Intravenous Q24H  . darbepoetin (ARANESP) injection - NON-DIALYSIS  150 mcg Subcutaneous Q Tue-1800  . furosemide  120 mg Intravenous Q8H  . heparin  5,000 Units Subcutaneous 3 times per day  . insulin aspart  2-6 Units Subcutaneous TID WC  . insulin aspart  6 Units Subcutaneous TID WC  . insulin glargine  46 Units Subcutaneous BID  . levothyroxine  75 mcg Oral QAC breakfast  . metoprolol succinate  25 mg Oral Daily  . pantoprazole  40 mg Oral Q0600    have reviewed scheduled and prn medications.  Physical Exam: General: NAD Heart: RRR Lungs: dec BS at bases Abdomen: obese, non tender Extremities: pitting edema    04/13/2015,10:48 AM  LOS: 7 days

## 2015-04-13 NOTE — Progress Notes (Signed)
Physical Therapy Treatment Patient Details Name: Kimberly [name] MRN: [id number] DOB: [birth year] Today's Date: 04/13/2015    History of Present Illness 59 year old female with diastolic heart failure, COPD, and vascular disease came to the [facility] emergency department on 04/06/2015 complaining of right heel pain. She has been followed by a local wound care clinic for several years. However, 2 days prior to admission she started to have increasing right heel pain and redness in the leg. This was associated with fatigue, malaise, and a low-grade fever. She also noted to ED staff increasing shortness of breath over the last several days. Presently diagnosed with severe sepsis secondary to bilateral heel wounds/and lower extremity cellulitis and urinary tract infection.    PT Comments    Pt reporting not feeling well today.  She reports an episode of "aspirating" on peanut butter and crackers last night and this made her not sleep well.  She is a bit self limiting today needing max encouragement to participate with PT, but she did and was able to walk as far as she did on our previous session.  She continues to participate, but needs encouragement to progress (walk further, participate in exercises).  HHPT continues to be appropriate at discharge.   Follow Up Recommendations  Home health PT     Equipment Recommendations  None recommended by PT    Recommendations for Other Services   NA     Precautions / Restrictions Precautions Precautions: Fall Precaution Comments: pt reported loss of sensation in B LE and that her balance is severely decreased due to complications of previous stroke, she is also very fearful of falling.    Mobility  Bed Mobility Overal bed mobility: Needs Assistance Bed Mobility: Supine to Sit;Sit to Supine     Supine to sit: Modified independent (Device/Increase time);HOB elevated Sit to supine: Mod assist   General bed mobility comments: Mod I to get EOB using HOB  elevation and railing for support.  Mod assist to help lift bil legs into bed to get back to supine in the bed.   Transfers Overall transfer level: Needs assistance Equipment used: Rolling walker (2 wheeled) Transfers: Sit to/from [employer] Sit to Stand: Min assist;Min guard Stand pivot transfers: Min guard       General transfer comment: Min assist to stand from elevated bed and lower recliner chair and 3-in-1.  Pt needs most assist from bed as she does not have two railings to push up from like on the recliner or BSC.  Pt relies heavily on arms and momentum to get to standing.   Ambulation/Gait Ambulation/Gait assistance: Min guard Ambulation Distance (Feet): 40 Feet Assistive device: Rolling walker (2 wheeled) Gait Pattern/deviations: Step-through pattern;Shuffle;Trunk flexed Gait velocity: decreased Gait velocity interpretation: Below normal speed for age/gender General Gait Details: Pt with continued flexed trunk, shuffling feet and wide BOS during gait.  DOE 3-4/4 during gait and O2 sats dropped to 85% immediately after walking on 3 L O2 [place].  It took ~2 mins for O2 to get back into the 90s.            Balance Overall balance assessment: Needs assistance   Sitting balance-Leahy Scale: Fair     Standing balance support: Bilateral upper extremity supported Standing balance-Leahy Scale: Poor                      Cognition Arousal/Alertness: Awake/alert Behavior During Therapy: WFL for tasks assessed/performed Overall Cognitive Status: Within Functional Limits for  tasks assessed                             Pertinent Vitals/Pain Pain Assessment: No/denies pain           PT Goals (current goals can now be found in the care plan section) Acute Rehab PT Goals Patient Stated Goal: to get back to being a short community ambulator.  Progress towards PT goals: Not progressing toward goals - comment (increased anxiety, maliase, and DOE  during activity today)    Frequency  Min 3X/week    PT Plan Current plan remains appropriate       End of Session Equipment Utilized During Treatment: Oxygen Activity Tolerance: Patient limited by fatigue;Treatment limited secondary to medical complications (Comment) (limited by DOE with gait) Patient left: in bed;with call bell/phone within reach     Time: 1027-1057 PT Time Calculation (min) (ACUTE ONLY): 30 min  Charges:  $Gait Training: 8-22 mins $Therapeutic Activity: 8-22 mins            Kimberly [name], PT, DPT [phone]   04/13/2015, 2:06 PM

## 2015-04-14 ENCOUNTER — Inpatient Hospital Stay (HOSPITAL_COMMUNITY): Payer: Managed Care, Other (non HMO)

## 2015-04-14 DIAGNOSIS — L97509 Non-pressure chronic ulcer of other part of unspecified foot with unspecified severity: Secondary | ICD-10-CM

## 2015-04-14 DIAGNOSIS — E039 Hypothyroidism, unspecified: Secondary | ICD-10-CM

## 2015-04-14 DIAGNOSIS — I272 Other secondary pulmonary hypertension: Secondary | ICD-10-CM

## 2015-04-14 DIAGNOSIS — B965 Pseudomonas (aeruginosa) (mallei) (pseudomallei) as the cause of diseases classified elsewhere: Secondary | ICD-10-CM | POA: Insufficient documentation

## 2015-04-14 DIAGNOSIS — R7881 Bacteremia: Secondary | ICD-10-CM | POA: Insufficient documentation

## 2015-04-14 DIAGNOSIS — E11621 Type 2 diabetes mellitus with foot ulcer: Secondary | ICD-10-CM

## 2015-04-14 DIAGNOSIS — Z6841 Body Mass Index (BMI) 40.0 and over, adult: Secondary | ICD-10-CM

## 2015-04-14 DIAGNOSIS — E1151 Type 2 diabetes mellitus with diabetic peripheral angiopathy without gangrene: Secondary | ICD-10-CM

## 2015-04-14 DIAGNOSIS — E1121 Type 2 diabetes mellitus with diabetic nephropathy: Secondary | ICD-10-CM

## 2015-04-14 DIAGNOSIS — L03116 Cellulitis of left lower limb: Secondary | ICD-10-CM

## 2015-04-14 DIAGNOSIS — L97429 Non-pressure chronic ulcer of left heel and midfoot with unspecified severity: Secondary | ICD-10-CM

## 2015-04-14 DIAGNOSIS — J449 Chronic obstructive pulmonary disease, unspecified: Secondary | ICD-10-CM

## 2015-04-14 DIAGNOSIS — L97419 Non-pressure chronic ulcer of right heel and midfoot with unspecified severity: Secondary | ICD-10-CM

## 2015-04-14 LAB — RENAL FUNCTION PANEL
ANION GAP: 11 (ref 5–15)
Albumin: 2.7 g/dL — ABNORMAL LOW (ref 3.5–5.0)
BUN: 75 mg/dL — ABNORMAL HIGH (ref 6–20)
CALCIUM: 9.4 mg/dL (ref 8.9–10.3)
CO2: 29 mmol/L (ref 22–32)
Chloride: 102 mmol/L (ref 101–111)
Creatinine, Ser: 4 mg/dL — ABNORMAL HIGH (ref 0.44–1.00)
GFR calc Af Amer: 13 mL/min — ABNORMAL LOW (ref >60)
GFR calc non Af Amer: 11 mL/min — ABNORMAL LOW (ref >60)
Glucose, Bld: 52 mg/dL — ABNORMAL LOW (ref 65–99)
PHOSPHORUS: 4.8 mg/dL — AB (ref 2.5–4.6)
POTASSIUM: 3 mmol/L — AB (ref 3.5–5.1)
SODIUM: 142 mmol/L (ref 135–145)

## 2015-04-14 LAB — CBC
HEMATOCRIT: 29.9 % — AB (ref 36.0–46.0)
Hemoglobin: 9.5 g/dL — ABNORMAL LOW (ref 12.0–15.0)
MCH: 29.7 pg (ref 26.0–34.0)
MCHC: 31.8 g/dL (ref 30.0–36.0)
MCV: 93.4 fL (ref 78.0–100.0)
PLATELETS: 211 10*3/uL (ref 150–400)
RBC: 3.2 MIL/uL — AB (ref 3.87–5.11)
RDW: 16.2 % — AB (ref 11.5–15.5)
WBC: 12.1 10*3/uL — AB (ref 4.0–10.5)

## 2015-04-14 LAB — GLUCOSE, CAPILLARY
GLUCOSE-CAPILLARY: 165 mg/dL — AB (ref 65–99)
Glucose-Capillary: 65 mg/dL (ref 65–99)
Glucose-Capillary: 82 mg/dL (ref 65–99)
Glucose-Capillary: 144 mg/dL — ABNORMAL HIGH (ref 65–99)
Glucose-Capillary: 91 mg/dL (ref 65–99)

## 2015-04-14 LAB — HEMOGLOBIN A1C
HEMOGLOBIN A1C: 8.3 % — AB (ref 4.8–5.6)
MEAN PLASMA GLUCOSE: 192 mg/dL

## 2015-04-14 MED ORDER — HEPARIN SODIUM (PORCINE) 5000 UNIT/ML IJ SOLN
5000.0000 [IU] | Freq: Two times a day (BID) | INTRAMUSCULAR | Status: DC
  Administered 2015-04-14 – 2015-04-20 (×12): 5000 [IU] via SUBCUTANEOUS
  Filled 2015-04-14 (×12): qty 1

## 2015-04-14 MED ORDER — PIPERACILLIN-TAZOBACTAM 3.375 G IVPB
3.3750 g | Freq: Three times a day (TID) | INTRAVENOUS | Status: DC
  Administered 2015-04-15 (×2): 3.375 g via INTRAVENOUS
  Filled 2015-04-14 (×5): qty 50

## 2015-04-14 MED ORDER — INSULIN GLARGINE 100 UNIT/ML ~~LOC~~ SOLN
40.0000 [IU] | Freq: Two times a day (BID) | SUBCUTANEOUS | Status: DC
  Administered 2015-04-14 – 2015-04-19 (×11): 40 [IU] via SUBCUTANEOUS
  Filled 2015-04-14 (×14): qty 0.4

## 2015-04-14 MED ORDER — POTASSIUM CHLORIDE CRYS ER 20 MEQ PO TBCR
40.0000 meq | EXTENDED_RELEASE_TABLET | Freq: Every day | ORAL | Status: DC
  Administered 2015-04-14 – 2015-04-20 (×7): 40 meq via ORAL
  Filled 2015-04-14 (×7): qty 2

## 2015-04-14 NOTE — Consult Note (Signed)
[facility]    Date of Admission:  04/06/2015   Day 4 Ceftazimide                    Reason for Consult: Diabetic foot ulcers, BLE cellulitis, 1/2 blood culture positive for Pseudomonas    Referring Physician:    Principal Problem:   Severe sepsis Active Problems:   DM (diabetes mellitus), type 2 with renal complications   Essential hypertension   Peripheral vascular disease   CKD (chronic kidney disease), stage III   On home oxygen therapy   Hypothyroidism   Morbid obesity   Pressure ulcer   Acute exacerbation of CHF (congestive heart failure)   Diabetic foot infection   UTI (urinary tract infection)   Blood poisoning   Acute on chronic diastolic heart failure   Renal failure (ARF), acute on chronic   ARF (acute renal failure)   . aspirin EC  81 mg Oral Daily  . cefTAZidime (FORTAZ)  IV  2 g Intravenous Q24H  . darbepoetin (ARANESP) injection - NON-DIALYSIS  150 mcg Subcutaneous Q Tue-1800  . feeding supplement (PRO-STAT SUGAR FREE 64)  30 mL Oral BID  . ferric gluconate (FERRLECIT/NULECIT) IV  125 mg Intravenous Daily  . furosemide  120 mg Intravenous Q8H  . heparin  5,000 Units Subcutaneous Q12H  . insulin aspart  2-6 Units Subcutaneous TID WC  . insulin aspart  6 Units Subcutaneous TID WC  . insulin glargine  40 Units Subcutaneous BID  . levothyroxine  75 mcg Oral QAC breakfast  . metoprolol succinate  25 mg Oral Daily  . pantoprazole  40 mg Oral Q0600  . potassium chloride  40 mEq Oral Daily    Recommendations: 1. Recommend obtaining X-rays of both feet to r/o bone involvement 2. Would also recommend MRI of BLE to determine depth of infection in soft tissue 3. Vascular study would be appropriate given her significant hx of vascular disease, areas of eschar tissue and cellulitis.  4. Recommend switching back to Zosyn to include Strep coverage on top of Pseudomonal coverage.   Assessment: Kimberly [name] has bilateral lower  extremity cellulitis, bilateral diabetic pressure ulcers on heels as well as one area of eschar to her left pinky toe. She reports a history of weeping edema which has much improved with diuresis. Her skin is now very dry and flaking. She has PVD skin color changes to both lower extremities. The lateral side of her heel on the right foot has an area that is dark purple. Her left pink toe has an area of eschar. Her right heel also has an area of dark purple/black tissue. She states ortho had been in last week and recommended abx therapy and compression wraps while holding off on debridement. She has not had any recent studies on her legs to determine vascular status or infection in deep tissues/bone. Would recommend further imaging to decide course of treatment. The other concern are her multiple co-morbidities of COPD, severe pulmonary HTN, obesity, DMII and her ability to undergo potential surgery in the chance she would need amputation or deep debridement. She has had complications in the past with prolonged ventilatory support s/p anesthesia. One of two sets of blood cultures came back positive for Pseudomonas. However, given the risk for strep infection in her legs, would recommend broadening therapy back to Zosyn.    HPI: Kimberly [name] is a 59 y.o. female with PMH of  COPD, DMII, CHF, severe pulmonary hypertension, PVD, MI, CVA and cellulitis who presented to [facility] on 04/06/15 for c/o right foot pain, redness, fatigue, malaise and low grade fever. She has a five year history of cellulitis and recent diagnosis of diabetic foot ulcers. She has been managed by [facility] wound care center. She was not on antibiotics outpatient. She was found to be septic in the ED, blood cultures were drawn, she was started on Vanc and she was given gentle IV hydration d/t CHF. One of two blood cultures came back positive for Pseudomonas, pan-sensitive. ID consult for positive cultures and rec's for abx   Review of  Systems: Constitutional: positive for fevers and malaise Respiratory: positive for cough and shortness of breath Cardiovascular: positive for lower extremity edema, orthopnea and MI, CAD Gastrointestinal: positive for diarrhea Integument/breast: positive for dryness, skin color change and falking, swelling, pain of lower extremities. Positive for diabetic foot ulcers bilateral heels and cellulitis bilateral legs.   Past Medical History  Diagnosis Date  . HYPERLIPIDEMIA   . HYPERTENSION   . CAD, NATIVE VESSEL     May 10, 2010 cath showed a hyperdynamic LV function, she had dominant circumflex anatomy with a 70-80% small OM1. She had diffuse diabetic plaque particularly in the distal LAD. She nondominant RCA.  Nondominant  . PVD     CEA  . COPD   . Edema   . CHF (congestive heart failure)     Preserved EF  . Cellulitis 10/15/2013    BILATERAL  . Diabetic neuropathy   . Anasarca 05/2014  . Type II diabetes mellitus   . Proliferative retinopathy     hx/notes 01/27/2010  . Peripheral neuropathy     hx/notes 01/27/2010  . Complication of anesthesia     "they had trouble reviving me after the latest carotid OR" (04/06/2015)  . Heart attack     "on the table when I had my last carotid OR" (04/06/2015)  . Asthma   . On home oxygen therapy     "2.5L; 24/7" (04/06/2015)  . Pneumonia "several times"  . Chronic bronchitis     "often; usually q yr" (04/06/2015)  . Hypothyroidism   . History of hiatal hernia   . GERD (gastroesophageal reflux disease)   . 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 Arthritis     "aches and pains all over" (04/06/2015)  . Anxiety   . Depression   . Chronic kidney disease     "they've had me on dialysis periodically" (04/06/2015)  . Paralyzed vocal cords     History  Substance Use Topics  . Smoking status: Former Smoker -- 1.00 packs/day for 20  years    Types: Cigarettes    Quit date: 09/11/2005  . Smokeless tobacco: Never Used  . Alcohol Use: No    Family History  Problem Relation Age of Onset  . Cancer Mother     Breast, NHL  . Stroke Mother   . Peripheral vascular disease Father   . CAD Father 44  . Heart attack Father   . Hypertension Father   . Asthma Father    Allergies  Allergen Reactions  . Ciprofloxacin Itching  . Epinephrine     Increased heart rate    OBJECTIVE: Blood pressure 131/45, pulse 88, temperature 97.8 F (36.6 C), temperature source Oral, resp. rate 20, height   (1.626 m), weight 281 lb 1.4 oz (127.5 kg), SpO2 99 %. General: Reports loss of weight from IV diuresis. Fatigue.  Skin: PVD skin color changes to BLE. Flaking skin to skin below the knees. Deep purple colored diabetic skin ulcers to bilateral heels R>L. Eschar to left pinky toe. (See pictures) Lungs: Crackles in bilateral bases Cor: +JVD, distant heart sounds Abdomen: Obese, non-tender. Bowel sounds active X4.   Lab Results Lab Results  Component Value Date   WBC 12.1* 04/14/2015   HGB 9.5* 04/14/2015   HCT 29.9* 04/14/2015   MCV 93.4 04/14/2015   PLT 211 04/14/2015    Lab Results  Component Value Date   CREATININE 4.00* 04/14/2015   BUN 75* 04/14/2015   NA 142 04/14/2015   K 3.0* 04/14/2015   CL 102 04/14/2015   CO2 29 04/14/2015    Lab Results  Component Value Date   ALT 13* 04/06/2015   AST 27 04/06/2015   ALKPHOS 96 04/06/2015   BILITOT 1.6* 04/06/2015     Microbiology: Recent Results (from the past 240 hour(s))  Culture, blood (routine x 2)     Status: None   Collection Time: 04/06/15  2:13 PM  Result Value Ref Range Status   Specimen Description BLOOD ARM RIGHT  Final   Special Requests BOTTLES DRAWN AEROBIC AND ANAEROBIC 5CC  Final   Culture  Setup Time   Final    GRAM NEGATIVE RODS AEROBIC BOTTLE ONLY CRITICAL RESULT CALLED TO, READ BACK BY AND VERIFIED WITH: Burnard Hawthorne, RN AT 1133 ON [id number]  BY Lucienne Capers    Culture PSEUDOMONAS AERUGINOSA  Final   Report Status 04/09/2015 FINAL  Final   Organism ID, Bacteria PSEUDOMONAS AERUGINOSA  Final      Susceptibility   Pseudomonas aeruginosa - MIC*    CEFTAZIDIME 2 SENSITIVE Sensitive     CIPROFLOXACIN <=0.25 SENSITIVE Sensitive     GENTAMICIN <=1 SENSITIVE Sensitive     IMIPENEM 1 SENSITIVE Sensitive     PIP/TAZO <=4 SENSITIVE Sensitive     CEFEPIME 2 SENSITIVE Sensitive     * PSEUDOMONAS AERUGINOSA  Culture, Urine     Status: None   Collection Time: 04/06/15  2:52 PM  Result Value Ref Range Status   Specimen Description URINE, RANDOM  Final   Special Requests ADDED [id number]  Final   Culture MULTIPLE SPECIES PRESENT, SUGGEST RECOLLECTION  Final   Report Status 04/09/2015 FINAL  Final  Culture, blood (routine x 2)     Status: None   Collection Time: 04/06/15  3:00 PM  Result Value Ref Range Status   Specimen Description BLOOD LEFT HAND  Final   Special Requests BOTTLES DRAWN AEROBIC AND ANAEROBIC  Final   Culture NO GROWTH 5 DAYS  Final   Report Status 04/11/2015 FINAL  Final  MRSA PCR Screening     Status: Abnormal   Collection Time: 04/06/15 10:17 PM  Result Value Ref Range Status   MRSA by PCR POSITIVE (A) NEGATIVE Final    Comment:        The GeneXpert MRSA Assay (FDA approved for NASAL specimens only), is one component of a comprehensive MRSA colonization surveillance program. It is not intended to diagnose MRSA infection nor to guide or monitor treatment for MRSA infections. RESULT CALLED TO, READ BACK BY AND VERIFIED WITH: Tyler Aas RN [id number] 04/07/15 MITCHELL,L     Alphonsa Overall, NP student [facility] 04/14/2015, 2:54 PM  INFECTIOUS DISEASE ATTENDING ADDENDUM:     [facility]   Date: 04/14/2015  Patient name: Kimberly [name]  Medical record number: [id number]  Date of birth: May 13, 1956    This patient has  been seen and discussed with the NP.  Please see her note for complete details. I concur with their findings with the following additions/corrections:  This is a 59 year old lady with multiple medical problems including COPD congestive heart failure pulmonary hypertension poorly controlled diabetes with peripheral vascular disease and diabetic foot ulcers who presented with worsening new diabetic foot ulcers on her heels bilaterally as well as ischemic changes in her left toe and bilateral cellulitic changes. She had blood cultures done on admission which are growing 1 out of 2 positive for pseudomonas aeruginosa. She had been on Zosyn but then changed to ciprofloxacin which she did not tolerate due to pruritus. She is now on ceftazidime.  She feels relatively well and says that the areas of edema and erythema in her legs have improved but she still has an area on her left toe in particular that is concerning for possible ischemia and she does indeed have ulcerations on her heels see pictures below.   04/14/15:                I will change her back to zosyn for better strep coverage along with pseudomonas coverage. Cefepime would be just fine as well but there ? A national shortage/  I will engage the Diabetic Foot Focused Order Set including plain films of both feet complete and consider more aggressive imaging.    Acey Lav 04/14/2015, 5:44 PM

## 2015-04-14 NOTE — Progress Notes (Signed)
ANTIBIOTIC CONSULT NOTE  Pharmacy Consult for Ceftazidime Indication: Wound Infection  Allergies  Allergen Reactions  . Ciprofloxacin Itching  . Epinephrine     Increased heart rate    Patient Measurements: Height:  (162.6 cm) Weight: 281 lb 1.4 oz (127.5 kg) IBW/kg (Calculated) : 54.7 Vital Signs: Temp: 97.8 F (36.6 C) (08/03 0610) Temp Source: Oral (08/03 0610) BP: 125/29 mmHg (08/03 0610) Pulse Rate: 80 (08/03 0610) Intake/Output from previous day: 08/02 0701 - 08/03 0700 In: 720 [P.O.:720] Out: 5400 [Urine:5400] Intake/Output from this shift: Total I/O In: 480 [P.O.:480] Out: 500 [Urine:500]  Labs:  Recent Labs  04/12/15 0558 04/13/15 0533 04/14/15 0550  WBC 11.6*  --  12.1*  HGB 8.9*  --  9.5*  PLT 193  --  211  CREATININE 4.21* 4.39* 4.00*   Estimated Creatinine Clearance: 19.8 mL/min (by C-G formula based on Cr of 4). No results for input(s): VANCOTROUGH, VANCOPEAK, VANCORANDOM, GENTTROUGH, GENTPEAK, GENTRANDOM, TOBRATROUGH, TOBRAPEAK, TOBRARND, AMIKACINPEAK, AMIKACINTROU, AMIKACIN in the last 72 hours.   Microbiology: Recent Results (from the past 720 hour(s))  Culture, blood (routine x 2)     Status: None   Collection Time: 04/06/15  2:13 PM  Result Value Ref Range Status   Specimen Description BLOOD ARM RIGHT  Final   Special Requests BOTTLES DRAWN AEROBIC AND ANAEROBIC 5CC  Final   Culture  Setup Time   Final    GRAM NEGATIVE RODS AEROBIC BOTTLE ONLY CRITICAL RESULT CALLED TO, READ BACK BY AND VERIFIED WITH: Burnard Hawthorne, RN AT 1133 ON [id number] BY Lucienne Capers    Culture PSEUDOMONAS AERUGINOSA  Final   Report Status 04/09/2015 FINAL  Final   Organism ID, Bacteria PSEUDOMONAS AERUGINOSA  Final      Susceptibility   Pseudomonas aeruginosa - MIC*    CEFTAZIDIME 2 SENSITIVE Sensitive     CIPROFLOXACIN <=0.25 SENSITIVE Sensitive     GENTAMICIN <=1 SENSITIVE Sensitive     IMIPENEM 1 SENSITIVE Sensitive     PIP/TAZO <=4 SENSITIVE Sensitive      CEFEPIME 2 SENSITIVE Sensitive     * PSEUDOMONAS AERUGINOSA  Culture, Urine     Status: None   Collection Time: 04/06/15  2:52 PM  Result Value Ref Range Status   Specimen Description URINE, RANDOM  Final   Special Requests ADDED [id number]  Final   Culture MULTIPLE SPECIES PRESENT, SUGGEST RECOLLECTION  Final   Report Status 04/09/2015 FINAL  Final  Culture, blood (routine x 2)     Status: None   Collection Time: 04/06/15  3:00 PM  Result Value Ref Range Status   Specimen Description BLOOD LEFT HAND  Final   Special Requests BOTTLES DRAWN AEROBIC AND ANAEROBIC  Final   Culture NO GROWTH 5 DAYS  Final   Report Status 04/11/2015 FINAL  Final  MRSA PCR Screening     Status: Abnormal   Collection Time: 04/06/15 10:17 PM  Result Value Ref Range Status   MRSA by PCR POSITIVE (A) NEGATIVE Final    Comment:        The GeneXpert MRSA Assay (FDA approved for NASAL specimens only), is one component of a comprehensive MRSA colonization surveillance program. It is not intended to diagnose MRSA infection nor to guide or monitor treatment for MRSA infections. RESULT CALLED TO, READ BACK BY AND VERIFIED WITH: Tyler Aas RN [id number] 04/07/15 MITCHELL,L    Assessment: Kimberly [name] being treated for wound infection. Previously on vanco/zosyn for empiric coverage. Patient could not tolerate  ciprofloxacin, and currently remains on ceftazidime. WBC still up slightly at 12.1, afebrile. SCr appears to be beginning to trend down- 4 today, est CrCl ~15-69mL/min.  Zosyn 7/26>>7/31 Vanco 7/26>>7/27 Cipro 7/30 x1 dose Elita Quick 7/31 >>  BCx 7/26 1/2 Pseudomonas (pan-sensitive)   Goal of Therapy:  Control of infection and clinical infection  Plan:  -Ceftazidime 2G IV Q24hr -Monitor clinical progression, renal function, LOT  Blasa Raisch D. Gal Smolinski, PharmD, BCPS Clinical Pharmacist Pager: [phone] 04/14/2015 10:42 AM

## 2015-04-14 NOTE — Progress Notes (Addendum)
[facility] PROGRESS NOTE  Kimberly [name] WUJ:[id number] DOB: 02-Aug-1956 DOA: 04/06/2015 PCP: [name], MD  Brief history of present illness 59 year old Caucasian female resented with bilateral lower extremity cellulitis in the setting of heel wounds. She has history of diabetes. She was also septic when she initially presented to the hospital. Started on antibiotics. Seen by orthopedics. Developed acute and chronic renal failure with diastolic CHF exacerbation. Currently on high-dose Lasix. ID has been consulted.  Assessment/Plan: Severe sepsis secondary to bilateral heel wounds/and lower extremity cellulitis and urinary tract infection. Patient currently afebrile. Blood pressure has improved. Urine cultures with multiple species. Blood cultures 1/2 with Pseudomonas likely a contaminant. Patient with clinical improvement. Currently on intravenous Fortaz. Patient could not tolerate intravenous ciprofloxacin due to ?skin reaction. Profore compression wraps to bilateral lower extremities. heel floaters. Patient has been seen in consultation by orthopedics who recommended IV antibiotics, compression wraps, outpatient follow-up. Patient will follow-up with the wound care center on discharge. We will consult infectious disease to assist with antibiotics management.  Acute on chronic chronic kidney disease stage III May be secondary to ATN secondary to sepsis versus secondary to a prerenal azotemia secondary to acute CHF exacerbation. Patient does look volume overloaded on exam.. Baseline creatinine about 2.0. Creatinine trending back up and now at 4.39 from 4.21 from 3.59 yesterday. Renal function seems to be plateauing. Patient's Lasix has been increased per nephrology to 120 mg IV every 8 hours. Nephrology following and appreciate input and recommendations. Defer electrolyte management to nephrology.  Probable acute on chronic diastolic CHF exacerbation Patient on admission was  hypotensive and as such could not be diuresed. Patient's blood pressure responded to fluids. Patient then developed fluid overload. Patient's blood pressure has improved. Patient's renal function is worsened and could be secondary to acute on chronic diastolic CHF exacerbation. Patient on high-dose Lasix. Nephrology is following.   Urinary tract infection Multiple species present.   Bilateral heel wounds/lower extremity cellulitis See above  History of CVA Stable.  Hypothyroidism Continue home dose Synthroid.  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 CBGs have ranged from 80-158. Noted to have borderline low blood sugars over the past 24-48 hours with a hypoglycemic episode this morning. We'll cut back on the dose of Lantus. Sliding scale insulin.  Essential hypertension  Stable. Continue metoprolol. Norvasc has been discontinued to increase renal perfusion.  Morbid obesity  Prophylaxis  Heparin for DVT prophylaxis. Reduce dose due to bleeding.  Code Status: DO NOT RESUSCITATE Family Communication: Discussed with patient Disposition Plan: Wait for renal function to improve. We will consult ID for recommendations regarding antibiotics.   Consultants:  Orthopedics: Dr. Lajoyce Corners 04/06/2015  Nephrology: Dr. Kathrene Bongo 04/12/2015  Infectious diseases, Dr. Daiva Eves  Procedures:  Chest x-ray 04/06/2015  Antibiotics:  IV Zosyn 04/06/2015>>>>> 04/11/2015  IV vancomycin 04/06/2015>>>> 04/07/2015  IV ciprofloxacin 04/10/2015 >>>> 04/10/2015  IV Elita Quick 04/11/2015  Subjective: Patient feels better this morning. She tells me that she urinated a lot yesterday. Denies any pain in her lower extremities. No nausea or vomiting.   Objective: Filed Vitals:   04/14/15 0610  BP: 125/29  Pulse: 80  Temp: 97.8 F (36.6 C)  Resp: 28    Intake/Output Summary (Last 24 hours) at 04/14/15 1008 Last data filed at 04/14/15 0900  Gross per 24 hour   Intake    960 ml  Output   4700 ml  Net  -3740 ml   Filed Weights   04/12/15 0613 04/13/15  1610 04/14/15 0610  Weight: 133.4 kg (294 lb 1.5 oz) 133 kg (293 lb 3.4 oz) 127.5 kg (281 lb 1.4 oz)    Exam:   General:  NAD. Morbidly obese  Cardiovascular: S1, S2 is normal, regular. No S3, S4.  Respiratory: Diminished air entry at the bases. No wheezing. No definite crackles heard today.   Abdomen: Soft, nontender, nondistended, positive bowel sounds.  Musculoskeletal: No clubbing or cyanosis. Bilateral lower extremities wrapped. Edema noted bilateral lower extremities.  Data Reviewed: Basic Metabolic Panel:  Recent Labs Lab 04/10/15 0520 04/11/15 0546 04/12/15 0558 04/13/15 0533 04/14/15 0550  NA 140 139 138 138 142  K 4.5 4.3 4.2 3.6 3.0*  CL 107 106 106 101 102  CO2 GLUCOSE 82 67 126* 98 52*  BUN 81* 82* 83* 84* 75*  CREATININE 3.23* 3.59* 4.21* 4.39* 4.00*  CALCIUM 8.7* 8.5* 8.6* 9.0 9.4  MG  --   --  2.3  --   --   PHOS  --   --  5.9* 6.0* 4.8*   Liver Function Tests:  Recent Labs Lab 04/12/15 0558 04/13/15 0533 04/14/15 0550  ALBUMIN 2.5* 2.8* 2.7*   CBC:  Recent Labs Lab 04/09/15 0550 04/10/15 0520 04/11/15 0546 04/12/15 0558 04/14/15 0550  WBC 11.5* 10.3 9.7 11.6* 12.1*  NEUTROABS  --  8.3*  --   --   --   HGB 9.1* 9.0* 9.1* 8.9* 9.5*  HCT 29.2* 29.0* 29.1* 29.0* 29.9*  MCV 95.7 93.9 93.6 92.9 93.4  PLT 197 192 180 193 211   BNP (last 3 results)  Recent Labs  04/06/15 1413 04/08/15 0526  BNP 510.5* 127.0*    CBG:  Recent Labs Lab 04/13/15 1213 04/13/15 1711 04/13/15 2254 04/14/15 0801 04/14/15 0921  GLUCAP 158* 80 96 65 82    Recent Results (from the past 240 hour(s))  Culture, blood (routine x 2)     Status: None   Collection Time: 04/06/15  2:13 PM  Result Value Ref Range Status   Specimen Description BLOOD ARM RIGHT  Final   Special Requests BOTTLES DRAWN AEROBIC AND ANAEROBIC 5CC  Final   Culture   Setup Time   Final    GRAM NEGATIVE RODS AEROBIC BOTTLE ONLY CRITICAL RESULT CALLED TO, READ BACK BY AND VERIFIED WITH: Burnard Hawthorne, RN AT 1133 ON [id number] BY Lucienne Capers    Culture PSEUDOMONAS AERUGINOSA  Final   Report Status 04/09/2015 FINAL  Final   Organism ID, Bacteria PSEUDOMONAS AERUGINOSA  Final      Susceptibility   Pseudomonas aeruginosa - MIC*    CEFTAZIDIME 2 SENSITIVE Sensitive     CIPROFLOXACIN <=0.25 SENSITIVE Sensitive     GENTAMICIN <=1 SENSITIVE Sensitive     IMIPENEM 1 SENSITIVE Sensitive     PIP/TAZO <=4 SENSITIVE Sensitive     CEFEPIME 2 SENSITIVE Sensitive     * PSEUDOMONAS AERUGINOSA  Culture, Urine     Status: None   Collection Time: 04/06/15  2:52 PM  Result Value Ref Range Status   Specimen Description URINE, RANDOM  Final   Special Requests ADDED [id number]  Final   Culture MULTIPLE SPECIES PRESENT, SUGGEST RECOLLECTION  Final   Report Status 04/09/2015 FINAL  Final  Culture, blood (routine x 2)     Status: None   Collection Time: 04/06/15  3:00 PM  Result Value Ref Range Status   Specimen Description BLOOD LEFT HAND  Final   Special Requests BOTTLES  DRAWN AEROBIC AND ANAEROBIC  Final   Culture NO GROWTH 5 DAYS  Final   Report Status 04/11/2015 FINAL  Final  MRSA PCR Screening     Status: Abnormal   Collection Time: 04/06/15 10:17 PM  Result Value Ref Range Status   MRSA by PCR POSITIVE (A) NEGATIVE Final    Comment:        The GeneXpert MRSA Assay (FDA approved for NASAL specimens only), is one component of a comprehensive MRSA colonization surveillance program. It is not intended to diagnose MRSA infection nor to guide or monitor treatment for MRSA infections. RESULT CALLED TO, READ BACK BY AND VERIFIED WITH: [facility] RN 2130 04/07/15 MITCHELL,L      Studies: US Renal  04/12/2015   CLINICAL DATA:  Acute renal failure.  History of diabetes.  EXAM: RENAL / URINARY TRACT ULTRASOUND COMPLETE  COMPARISON:  CT of the abdomen and pelvis  06/10/2014  FINDINGS: Right Kidney:  Length: 10.1 cm. There is poor visualization of the right kidney. No hydronephrosis or mass identified.  Left Kidney:  Length: 13.2 cm. Echogenicity within normal limits. No mass or hydronephrosis visualized.  Bladder:  Appears normal for degree of bladder distention. Study is technically compromised by patient body habitus.  IMPRESSION: No hydronephrosis.   Electronically Signed   By: [name] M.D.   On: 04/12/2015 10:29    Scheduled Meds: . aspirin EC  81 mg Oral Daily  . cefTAZidime (FORTAZ)  IV  2 g Intravenous Q24H  . darbepoetin (ARANESP) injection - NON-DIALYSIS  150 mcg Subcutaneous Q Tue-1800  . feeding supplement (PRO-STAT SUGAR FREE 64)  30 mL Oral BID  . ferric gluconate (FERRLECIT/NULECIT) IV  125 mg Intravenous Daily  . furosemide  120 mg Intravenous Q8H  . heparin  5,000 Units Subcutaneous Q12H  . insulin aspart  2-6 Units Subcutaneous TID WC  . insulin aspart  6 Units Subcutaneous TID WC  . insulin glargine  40 Units Subcutaneous BID  . levothyroxine  75 mcg Oral QAC breakfast  . metoprolol succinate  25 mg Oral Daily  . pantoprazole  40 mg Oral Q0600   Continuous Infusions:    Principal Problem:   Severe sepsis Active Problems:   DM (diabetes mellitus), type 2 with renal complications   Essential hypertension   Peripheral vascular disease   CKD (chronic kidney disease), stage III   On home oxygen therapy   Hypothyroidism   Morbid obesity   Pressure ulcer   Acute exacerbation of CHF (congestive heart failure)   Diabetic foot infection   UTI (urinary tract infection)   Blood poisoning   Acute on chronic diastolic heart failure   Renal failure (ARF), acute on chronic   ARF (acute renal failure)    Time spent: 35 mins    Kaelynn Igo MD [facility] Pager [phone]   If 7PM-7AM, please contact night-coverage at [email or web address], password [facility] 04/14/2015, 10:08 AM  LOS: 8 days

## 2015-04-14 NOTE — Progress Notes (Signed)
Hypoglycemic Event  CBG: 65  Treatment: 15 GM carbohydrate snack  Symptoms: Hungry  Follow-up CBG: Time:0921 CBG Result:82  Possible Reasons for Event: Medication regimen: Lantus?  Comments/MD notified: Rito Ehrlich MD made aware     Al Decant  Remember to initiate Hypoglycemia Order Set & complete

## 2015-04-14 NOTE — Progress Notes (Signed)
ANTIBIOTIC CONSULT NOTE  Pharmacy Consult for Zosyn Indication: Wound Infection  Assessment: 59 yoF being treated for wound infection. Previously on vanco/zosyn for empiric coverage. Patient could not tolerate ciprofloxacin, and  Ceftazidime was continued. WBC still up slightly at 12.1, afebrile. SCr appears to be beginning to trend down- 4 today, est CrCl ~15-55mL/min.  1/2 bld cxs positive for pseudomonas, ID changed back to zosyn for broader coverage of strep and pseudomonas.  Zosyn 7/26>>7/31 Vanco 7/26>>7/27 Cipro 7/30 x1 dose Kimberly [name] 7/31 >>8/3  BCx 7/26 1/2 Pseudomonas (pan-sensitive)   Goal of Therapy:  Control of infection and clinical infection  Plan:  -Zosyn 3.375g IV q8h - scr trending down currently borderline ~20, will continue with extended interval dosing for now -Monitor clinical progression, renal function, LOT  Patient Measurements: Height: 5\' 4"  (162.6 cm) Weight: 281 lb 1.4 oz (127.5 kg) IBW/kg (Calculated) : 54.7 Vital Signs: Temp: 97.8 F (36.6 C) (08/03 1421) Temp Source: Oral (08/03 1421) BP: 131/45 mmHg (08/03 1421) Pulse Rate: 88 (08/03 1421) Intake/Output from previous day: 08/02 0701 - 08/03 0700 In: 720 [P.O.:720] Out: 5400 [Urine:5400] Intake/Output from this shift: Total I/O In: 480 [P.O.:480] Out: 2051 [Urine:2050; Stool:1]  Labs:  Recent Labs  04/12/15 0558 04/13/15 0533 04/14/15 0550  WBC 11.6*  --  12.1*  HGB 8.9*  --  9.5*  PLT 193  --  211  CREATININE 4.21* 4.39* 4.00*   Estimated Creatinine Clearance: 19.8 mL/min (by C-G formula based on Cr of 4). No results for input(s): VANCOTROUGH, VANCOPEAK, VANCORANDOM, GENTTROUGH, GENTPEAK, GENTRANDOM, TOBRATROUGH, TOBRAPEAK, TOBRARND, AMIKACINPEAK, AMIKACINTROU, AMIKACIN in the last 72 hours.   Microbiology: Recent Results (from the past 720 hour(s))  Culture, blood (routine x 2)     Status: None   Collection Time: 04/06/15  2:13 PM  Result Value Ref Range Status   Specimen  Description BLOOD ARM RIGHT  Final   Special Requests BOTTLES DRAWN AEROBIC AND ANAEROBIC 5CC  Final   Culture  Setup Time   Final    GRAM NEGATIVE RODS AEROBIC BOTTLE ONLY CRITICAL RESULT CALLED TO, READ BACK BY AND VERIFIED WITH: Burnard Hawthorne, RN AT 1133 ON [id number] BY Lucienne Capers    Culture PSEUDOMONAS AERUGINOSA  Final   Report Status 04/09/2015 FINAL  Final   Organism ID, Bacteria PSEUDOMONAS AERUGINOSA  Final      Susceptibility   Pseudomonas aeruginosa - MIC*    CEFTAZIDIME 2 SENSITIVE Sensitive     CIPROFLOXACIN <=0.25 SENSITIVE Sensitive     GENTAMICIN <=1 SENSITIVE Sensitive     IMIPENEM 1 SENSITIVE Sensitive     PIP/TAZO <=4 SENSITIVE Sensitive     CEFEPIME 2 SENSITIVE Sensitive     * PSEUDOMONAS AERUGINOSA  Culture, Urine     Status: None   Collection Time: 04/06/15  2:52 PM  Result Value Ref Range Status   Specimen Description URINE, RANDOM  Final   Special Requests ADDED [id number]  Final   Culture MULTIPLE SPECIES PRESENT, SUGGEST RECOLLECTION  Final   Report Status 04/09/2015 FINAL  Final  Culture, blood (routine x 2)     Status: None   Collection Time: 04/06/15  3:00 PM  Result Value Ref Range Status   Specimen Description BLOOD LEFT HAND  Final   Special Requests BOTTLES DRAWN AEROBIC AND ANAEROBIC  Final   Culture NO GROWTH 5 DAYS  Final   Report Status 04/11/2015 FINAL  Final  MRSA PCR Screening     Status: Abnormal   Collection  Time: 04/06/15 10:17 PM  Result Value Ref Range Status   MRSA by PCR POSITIVE (A) NEGATIVE Final    Comment:        The GeneXpert MRSA Assay (FDA approved for NASAL specimens only), is one component of a comprehensive MRSA colonization surveillance program. It is not intended to diagnose MRSA infection nor to guide or monitor treatment for MRSA infections. RESULT CALLED TO, READ BACK BY AND VERIFIED WITH: Tyler Aas RN 1610 04/07/15 MITCHELL,L   Sheppard Coil PharmD., BCPS Clinical Pharmacist Pager [phone] 04/14/2015  5:58 PM

## 2015-04-14 NOTE — Progress Notes (Signed)
Subjective:  UOP great- creatinine down Objective Vital signs in last 24 hours: Filed Vitals:   04/13/15 0449 04/13/15 1400 04/13/15 2328 04/14/15 0610  BP: 146/62 146/81 134/47 125/29  Pulse: 87 95 85 80  Temp: 97.8 F (36.6 C) 98.1 F (36.7 C) 98.3 F (36.8 C) 97.8 F (36.6 C)  TempSrc: Oral Oral Oral Oral  Resp:  16 24 28   Height:      Weight: 133 kg (293 lb 3.4 oz)   127.5 kg (281 lb 1.4 oz)  SpO2: 96% 91% 98% 98%   Weight change: -5.5 kg (-12 lb 2 oz)  Intake/Output Summary (Last 24 hours) at 04/14/15 1050 Last data filed at 04/14/15 0900  Gross per 24 hour  Intake    960 ml  Output   4200 ml  Net  -3240 ml     Assessment/Plan: 59 year old female with multiple medical issues presenting with sepsis related to foot wound- also has developed acute on chronic renal failure this hospitalization 1.Renal- acute on chronic renal failure. Although no significant hypotension recorded I still suspect possible ATN due to initial septic episode versus just some other hemodynamic compromise due to her cardiac and vascular disease. Fortunately, it appears that her urine output is picking up and creatinine is down. Continue lasix at current dose 2. Hypertension/volume - volume overloaded but with a reasonable blood pressure. Have stopped Norvasc and have instituted diuretics-  3. Anemia - hemoglobin low and likely not helping. Iron stores low, will replete  and have added ESA 4. Malnutrition- with decreased albumin also is not helping volume status- added protein supplement- hypokalemia-add supp 5. Dispo- I agree with her that instituting and maintaining chronic dialysis for her would be very difficult given her basically nonexistent mobility over the last 6 mos- bed to chair only with assist. I sincerely hope that it will not come to that for her    Jaquarious Grey A    Labs: Basic Metabolic Panel:  Recent Labs Lab 04/12/15 0558 04/13/15 0533 04/14/15 0550  NA 138 138  142  K 4.2 3.6 3.0*  CL 106 101 102  CO2 24 23 29   GLUCOSE 126* 98 52*  BUN 83* 84* 75*  CREATININE 4.21* 4.39* 4.00*  CALCIUM 8.6* 9.0 9.4  PHOS 5.9* 6.0* 4.8*   Liver Function Tests:  Recent Labs Lab 04/12/15 0558 04/13/15 0533 04/14/15 0550  ALBUMIN 2.5* 2.8* 2.7*   No results for input(s): LIPASE, AMYLASE in the last 168 hours. No results for input(s): AMMONIA in the last 168 hours. CBC:  Recent Labs Lab 04/09/15 0550 04/10/15 0520 04/11/15 0546 04/12/15 0558 04/14/15 0550  WBC 11.5* 10.3 9.7 11.6* 12.1*  NEUTROABS  --  8.3*  --   --   --   HGB 9.1* 9.0* 9.1* 8.9* 9.5*  HCT 29.2* 29.0* 29.1* 29.0* 29.9*  MCV 95.7 93.9 93.6 92.9 93.4  PLT 197 192 180 193 211   Cardiac Enzymes: No results for input(s): CKTOTAL, CKMB, CKMBINDEX, TROPONINI in the last 168 hours. CBG:  Recent Labs Lab 04/13/15 1213 04/13/15 1711 04/13/15 2254 04/14/15 0801 04/14/15 0921  GLUCAP 158* 80 96 65 82    Iron Studies:   Recent Labs  04/13/15 0553  IRON 45  TIBC 298  FERRITIN 98   Studies/Results: No results found. Medications: Infusions:    Scheduled Medications: . aspirin EC  81 mg Oral Daily  . cefTAZidime (FORTAZ)  IV  2 g Intravenous Q24H  . darbepoetin (ARANESP) injection - NON-DIALYSIS  150 mcg Subcutaneous Q Tue-1800  . feeding supplement (PRO-STAT SUGAR FREE 64)  30 mL Oral BID  . ferric gluconate (FERRLECIT/NULECIT) IV  125 mg Intravenous Daily  . furosemide  120 mg Intravenous Q8H  . heparin  5,000 Units Subcutaneous Q12H  . insulin aspart  2-6 Units Subcutaneous TID WC  . insulin aspart  6 Units Subcutaneous TID WC  . insulin glargine  40 Units Subcutaneous BID  . levothyroxine  75 mcg Oral QAC breakfast  . metoprolol succinate  25 mg Oral Daily  . pantoprazole  40 mg Oral Q0600    have reviewed scheduled and prn medications.  Physical Exam: General: NAD Heart: RRR Lungs: dec BS at bases Abdomen: obese, non tender Extremities: pitting  edema    04/14/2015,10:50 AM  LOS: 8 days

## 2015-04-14 NOTE — Care Management Note (Signed)
Case Management Note  Patient Details  Name: Kimberly [name] MRN: [id number] Date of Birth: 08/27/1956  Subjective/Objective:                 Patient from home with husband. Has oxygen at home, on 2L prior to admission. Patient states that she has walker at home and has had [facility] PT several times in the past and declined getting after discharge stating that she knows what to do. She feels she may need a nurse after discharge or to follow up at the wound clinic. She stated that if she had a HH RN she would like to use [facility].  Patient has changed her choice of HH provider to [place] whom she has used in the past. Davonna Belling with [facility] notified of referral 04/14/15.  Action/Plan:  Patient to discharge to home with [facility] RN to change dressings to BLE wounds.  Expected Discharge Date:                  Expected Discharge Plan:  Home w Home Health Services  In-House Referral:     Discharge planning Services  CM Consult  Post Acute Care Choice:  NA Choice offered to:  Patient  DME Arranged:    DME Agency:     HH Arranged:  RN HH Agency:  [facility]  Status of Service:  In process, will continue to follow  Medicare Important Message Given:    Date Medicare IM Given:    Medicare IM give by:    Date Additional Medicare IM Given:    Additional Medicare Important Message give by:     If discussed at Long Length of Stay Meetings, dates discussed:    Additional Comments:  Lawerance Sabal, RN 04/14/2015, 8:24 AM

## 2015-04-14 NOTE — Progress Notes (Signed)
Results for RAYNETTE, ARRAS (MRN [id number]) as of 04/14/2015 14:38  Ref. Range 04/13/2015 17:11 04/13/2015 22:54 04/14/2015 08:01 04/14/2015 09:21 04/14/2015 11:26  Glucose-Capillary Latest Ref Range: 65-99 mg/dL 80 96 65 82 409 (H)  Noted that orders were continuing Novolog 2-6 units TID with meals. Recommend discontinuing the Novolog 2-6 units TID (which is part of ICU hyperglycemia protocol) and changing to Novolog SENSITIVE correction scale TID and HS. May need to decrease Novolog 6 units meal coverage to 4 units TID with meals if blood sugars continue to be less than 100 mg/dl.  Will continue to follow while in hospital. [name] Mince RN BSN CDE

## 2015-04-14 NOTE — Consult Note (Addendum)
WOC follow-up: Physician in earlier and removed compression wraps to assess BLE.  Requested to reapply Profore.  Deep tissue injuries unchanged from previous assessment on Monday.  Applied 4 layer Profore compression wraps as requested by the ortho service. Pt will need follow-up by home health for dressing changes twice a week. Float heels to reduce pressure. Discussed plan of care with patient and she verbalized understanding. Plan to change dressings on MON if still in the hospital at that time. Please re-consult if further assistance is needed. Thank-you,  Cammie Mcgee MSN, RN, CWOCN, [place], CNS [phone]

## 2015-04-15 ENCOUNTER — Ambulatory Visit (HOSPITAL_COMMUNITY): Payer: Managed Care, Other (non HMO)

## 2015-04-15 ENCOUNTER — Inpatient Hospital Stay (HOSPITAL_COMMUNITY): Payer: Managed Care, Other (non HMO)

## 2015-04-15 DIAGNOSIS — I5033 Acute on chronic diastolic (congestive) heart failure: Secondary | ICD-10-CM

## 2015-04-15 DIAGNOSIS — L97523 Non-pressure chronic ulcer of other part of left foot with necrosis of muscle: Secondary | ICD-10-CM | POA: Insufficient documentation

## 2015-04-15 DIAGNOSIS — A4152 Sepsis due to Pseudomonas: Principal | ICD-10-CM

## 2015-04-15 DIAGNOSIS — L97521 Non-pressure chronic ulcer of other part of left foot limited to breakdown of skin: Secondary | ICD-10-CM

## 2015-04-15 LAB — HIV ANTIBODY (ROUTINE TESTING W REFLEX): HIV Screen 4th Generation wRfx: NONREACTIVE

## 2015-04-15 LAB — BASIC METABOLIC PANEL
Anion gap: 13 (ref 5–15)
BUN: 71 mg/dL — ABNORMAL HIGH (ref 6–20)
CHLORIDE: 97 mmol/L — AB (ref 101–111)
CO2: 31 mmol/L (ref 22–32)
CREATININE: 3.57 mg/dL — AB (ref 0.44–1.00)
Calcium: 9.1 mg/dL (ref 8.9–10.3)
GFR calc Af Amer: 15 mL/min — ABNORMAL LOW (ref >60)
GFR calc non Af Amer: 13 mL/min — ABNORMAL LOW (ref >60)
Glucose, Bld: 84 mg/dL (ref 65–99)
Potassium: 3.3 mmol/L — ABNORMAL LOW (ref 3.5–5.1)
Sodium: 141 mmol/L (ref 135–145)

## 2015-04-15 LAB — CBC
HCT: 29.1 % — ABNORMAL LOW (ref 36.0–46.0)
HEMOGLOBIN: 9.1 g/dL — AB (ref 12.0–15.0)
MCH: 29.1 pg (ref 26.0–34.0)
MCHC: 31.3 g/dL (ref 30.0–36.0)
MCV: 93 fL (ref 78.0–100.0)
PLATELETS: 208 10*3/uL (ref 150–400)
RBC: 3.13 MIL/uL — ABNORMAL LOW (ref 3.87–5.11)
RDW: 16.6 % — ABNORMAL HIGH (ref 11.5–15.5)
WBC: 9.1 10*3/uL (ref 4.0–10.5)

## 2015-04-15 LAB — GLUCOSE, CAPILLARY
GLUCOSE-CAPILLARY: 65 mg/dL (ref 65–99)
GLUCOSE-CAPILLARY: 80 mg/dL (ref 65–99)
GLUCOSE-CAPILLARY: 157 mg/dL — AB (ref 65–99)
Glucose-Capillary: 181 mg/dL — ABNORMAL HIGH (ref 65–99)
Glucose-Capillary: 275 mg/dL — ABNORMAL HIGH (ref 65–99)

## 2015-04-15 LAB — C-REACTIVE PROTEIN: CRP: 3.8 mg/dL — ABNORMAL HIGH (ref <1.0)

## 2015-04-15 LAB — PREALBUMIN: Prealbumin: 22.4 mg/dL (ref 18–38)

## 2015-04-15 LAB — SEDIMENTATION RATE: SED RATE: 110 mm/h — AB (ref 0–22)

## 2015-04-15 MED ORDER — INSULIN ASPART 100 UNIT/ML ~~LOC~~ SOLN
0.0000 [IU] | Freq: Three times a day (TID) | SUBCUTANEOUS | Status: DC
  Administered 2015-04-15 (×2): 2 [IU] via SUBCUTANEOUS
  Administered 2015-04-16 (×2): 3 [IU] via SUBCUTANEOUS
  Administered 2015-04-16: 1 [IU] via SUBCUTANEOUS
  Administered 2015-04-17 (×2): 5 [IU] via SUBCUTANEOUS
  Administered 2015-04-17: 1 [IU] via SUBCUTANEOUS

## 2015-04-15 MED ORDER — INSULIN ASPART 100 UNIT/ML ~~LOC~~ SOLN
0.0000 [IU] | Freq: Every day | SUBCUTANEOUS | Status: DC
  Administered 2015-04-15 – 2015-04-16 (×2): 3 [IU] via SUBCUTANEOUS

## 2015-04-15 MED ORDER — DEXTROSE 5 % IV SOLN
2.0000 g | INTRAVENOUS | Status: AC
  Administered 2015-04-15 – 2015-04-20 (×6): 2 g via INTRAVENOUS
  Filled 2015-04-15 (×7): qty 2

## 2015-04-15 MED ORDER — ADULT MULTIVITAMIN W/MINERALS CH
1.0000 | ORAL_TABLET | Freq: Every day | ORAL | Status: DC
  Administered 2015-04-16 – 2015-04-17 (×2): 1 via ORAL
  Filled 2015-04-15 (×4): qty 1

## 2015-04-15 NOTE — Progress Notes (Signed)
Fowarded clinicals 04/15/15. Attempted to call Angelique Blonder with [employer] at [phone]. Would remain on hold and line disconnected itself.

## 2015-04-15 NOTE — Progress Notes (Signed)
Utilization Review completed. Wave Calzada RN BSN CM 

## 2015-04-15 NOTE — Progress Notes (Signed)
[facility]    Date of Admission:  04/06/2015    Principal Problem:   Severe sepsis Active Problems:   DM (diabetes mellitus), type 2 with renal complications   Essential hypertension   Peripheral vascular disease   CKD (chronic kidney disease), stage III   On home oxygen therapy   Hypothyroidism   Morbid obesity   Pressure ulcer   Acute exacerbation of CHF (congestive heart failure)   Diabetic foot infection   UTI (urinary tract infection)   Blood poisoning   Acute on chronic diastolic heart failure   Renal failure (ARF), acute on chronic   ARF (acute renal failure)   Diabetic foot ulcer   Pseudomonal bacteremia   . aspirin EC  81 mg Oral Daily  . ceFEPime (MAXIPIME) IV  2 g Intravenous Q24H  . darbepoetin (ARANESP) injection - NON-DIALYSIS  150 mcg Subcutaneous Q Tue-1800  . feeding supplement (PRO-STAT SUGAR FREE 64)  30 mL Oral BID  . furosemide  120 mg Intravenous Q8H  . heparin  5,000 Units Subcutaneous Q12H  . insulin aspart  0-5 Units Subcutaneous QHS  . insulin aspart  0-9 Units Subcutaneous TID WC  . insulin glargine  40 Units Subcutaneous BID  . levothyroxine  75 mcg Oral QAC breakfast  . metoprolol succinate  25 mg Oral Daily  . pantoprazole  40 mg Oral Q0600  . potassium chloride  40 mEq Oral Daily    Subjective: States she is feeling better since she has continued diuresing. She denies pain in her legs, fevers or chills.   Review of Systems: Constitutional: Afebrile, States her "water weight" is coming down with diuresis Respiratory: positive for cough and shortness of breath Cardiovascular: positive for lower extremity edema, orthopnea and MI, CAD Gastrointestinal: States her stools have been loose, diarrhea improving Integument/breast: positive for dryness, skin color change and falking, swelling, pain of lower extremities. Positive for diabetic foot ulcers bilateral heels and cellulitis bilateral legs.   Past  Medical History  Diagnosis Date  . HYPERLIPIDEMIA   . HYPERTENSION   . CAD, NATIVE VESSEL     May 10, 2010 cath showed a hyperdynamic LV function, she had dominant circumflex anatomy with a 70-80% small OM1. She had diffuse diabetic plaque particularly in the distal LAD. She nondominant RCA.  Nondominant  . PVD     CEA  . COPD   . Edema   . CHF (congestive heart failure)     Preserved EF  . Cellulitis 10/15/2013    BILATERAL  . Diabetic neuropathy   . Anasarca 05/2014  . Type II diabetes mellitus   . Proliferative retinopathy     hx/notes 01/27/2010  . Peripheral neuropathy     hx/notes 01/27/2010  . Complication of anesthesia     "they had trouble reviving me after the latest carotid OR" (04/06/2015)  . Heart attack     "on the table when I had my last carotid OR" (04/06/2015)  . Asthma   . On home oxygen therapy     "2.5L; 24/7" (04/06/2015)  . Pneumonia "several times"  . Chronic bronchitis     "often; usually q yr" (04/06/2015)  . Hypothyroidism   . History of hiatal hernia   . GERD (gastroesophageal reflux disease)   . 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  Arthritis     "aches and pains all over" (04/06/2015)  . Anxiety   . Depression   . Chronic kidney disease     "they've had me on dialysis periodically" (04/06/2015)  . Paralyzed vocal cords     History  Substance Use Topics  . Smoking status: Former Smoker -- 1.00 packs/day for 20 years    Types: Cigarettes    Quit date: 09/11/2005  . Smokeless tobacco: Never Used  . Alcohol Use: No    Family History  Problem Relation Age of Onset  . Cancer Mother     Breast, NHL  . Stroke Mother   . Peripheral vascular disease Father   . CAD Father 72  . Heart attack Father   . Hypertension Father   . Asthma Father    Allergies  Allergen Reactions  . Ciprofloxacin Itching  . Epinephrine      Increased heart rate    OBJECTIVE: Blood pressure 127/41, pulse 87, temperature 98.3 F (36.8 C), temperature source Oral, resp. rate 20, height  (1.626 m), weight 275 lb 2.2 oz (124.8 kg), SpO2 99 %. General: NAD, afebrile Skin: PVD skin color changes to BLE. Flaking skin to skin below the knees. Deep purple colored diabetic skin ulcers to bilateral heels R>L. Eschar to left pinky toe. (See pictures) Lungs: Crackles in bilateral bases Cor: Distant heart sounds Abdomen: Obese, non-tender. Bowel sounds active X4.   Lab Results Lab Results  Component Value Date   WBC 9.1 04/15/2015   HGB 9.1* 04/15/2015   HCT 29.1* 04/15/2015   MCV 93.0 04/15/2015   PLT 208 04/15/2015    Lab Results  Component Value Date   CREATININE 3.57* 04/15/2015   BUN 71* 04/15/2015   NA 141 04/15/2015   K 3.3* 04/15/2015   CL 97* 04/15/2015   CO2 31 04/15/2015    Lab Results  Component Value Date   ALT 13* 04/06/2015   AST 27 04/06/2015   ALKPHOS 96 04/06/2015   BILITOT 1.6* 04/06/2015     Microbiology: Recent Results (from the past 240 hour(s))  Culture, blood (routine x 2)     Status: None   Collection Time: 04/06/15  2:13 PM  Result Value Ref Range Status   Specimen Description BLOOD ARM RIGHT  Final   Special Requests BOTTLES DRAWN AEROBIC AND ANAEROBIC 5CC  Final   Culture  Setup Time   Final    GRAM NEGATIVE RODS AEROBIC BOTTLE ONLY CRITICAL RESULT CALLED TO, READ BACK BY AND VERIFIED WITH: Burnard Hawthorne, RN AT 1133 ON [id number] BY Lucienne Capers    Culture PSEUDOMONAS AERUGINOSA  Final   Report Status 04/09/2015 FINAL  Final   Organism ID, Bacteria PSEUDOMONAS AERUGINOSA  Final      Susceptibility   Pseudomonas aeruginosa - MIC*    CEFTAZIDIME 2 SENSITIVE Sensitive     CIPROFLOXACIN <=0.25 SENSITIVE Sensitive     GENTAMICIN <=1 SENSITIVE Sensitive     IMIPENEM 1 SENSITIVE Sensitive     PIP/TAZO <=4 SENSITIVE Sensitive     CEFEPIME 2 SENSITIVE Sensitive     * PSEUDOMONAS  AERUGINOSA  Culture, Urine     Status: None   Collection Time: 04/06/15  2:52 PM  Result Value Ref Range Status   Specimen Description URINE, RANDOM  Final   Special Requests ADDED [id number]  Final   Culture MULTIPLE SPECIES PRESENT, SUGGEST RECOLLECTION  Final   Report Status 04/09/2015 FINAL  Final  Culture, blood (routine x 2)  Status: None   Collection Time: 04/06/15  3:00 PM  Result Value Ref Range Status   Specimen Description BLOOD LEFT HAND  Final   Special Requests BOTTLES DRAWN AEROBIC AND ANAEROBIC  Final   Culture NO GROWTH 5 DAYS  Final   Report Status 04/11/2015 FINAL  Final  MRSA PCR Screening     Status: Abnormal   Collection Time: 04/06/15 10:17 PM  Result Value Ref Range Status   MRSA by PCR POSITIVE (A) NEGATIVE Final    Comment:        The GeneXpert MRSA Assay (FDA approved for NASAL specimens only), is one component of a comprehensive MRSA colonization surveillance program. It is not intended to diagnose MRSA infection nor to guide or monitor treatment for MRSA infections. RESULT CALLED TO, READ BACK BY AND VERIFIED WITH: Tyler Aas RN 1610 04/07/15 MITCHELL,L     Assessment/Plan: Kimberly [name] has remained afebrile with down trending white count. She states her legs feel that they are improving with diuresis and antibiotics. One of two sets of blood cultures came back positive for Pseudomonas. However, given the risk for strep infection in her legs, would recommend broadening therapy back to Zosyn. Xrays of feet do not show signs of osteomyelitis from diabetic foot ulcers. She states ortho had been in last week and recommended abx therapy and compression wraps while holding off on debridement and/or amputation. Unfortunately due to her multiple co-morbidities of COPD, severe pulmonary HTN, obesity and DMII, any potential surgery should be proceeded with great caution. She has had complications in the past with prolonged ventilatory support s/p anesthesia.     1. Sepsis due to Pseudomonal infection likely from cellulitis (1/2 blood cx's) - Dr. Algis Liming discussed shortage of Cefepime and found that we do have this in house. Will change Zosyn to Cefepime - Cont with wound care as directed per wound care nurse  2. Diabetic foot ulcers:  - Will order MRI of feet to determine extent of tissue damage with diabetic foot ulcers - ABI's to assess for vascular changes    Alphonsa Overall, NP student [facility] Infectious [facility] 04/15/2015, 2:29 PM   INFECTIOUS DISEASE ATTENDING ADDENDUM:     [facility]   Date: 04/15/2015  Patient name: Kimberly [name]  Medical record number: [id number]  Date of birth: [birth year]    This patient has been seen and discussed with the house staff. Please see their note for complete details. I concur with their findings with the following additions/corrections:  Patient feeling better today but still with area darkened on toe, concerning for ischemia, infection      We will get bilateral MRI of feet to look for osteomyelitis  She is not eager to have ABI's performed but I am bothered by appearance of her last digit.    Should she need surgery she likely would need spinal surgery given her multiple comorbid cardiovascular and pulmonary pathologies.  She DOES need offloading boots for her Diabetic foot ulcers.  Regardng her Pseudomonal bacteremia she should get 14 days of therapy with 04/07/15 being day #1 (presuming clearance of bacteremia that day)  The cefepime will be easier given once daily and if she has to leave hospital with this as IV drug       Acey Lav 04/15/2015, 5:01 PM

## 2015-04-15 NOTE — Progress Notes (Signed)
Initial Nutrition Assessment  DOCUMENTATION CODES:   Morbid obesity  INTERVENTION:   -Continue 30 ml Prostat BID -MVI daily  NUTRITION DIAGNOSIS:   Increased nutrient needs related to wound healing as evidenced by estimated needs.  GOAL:   Patient will meet greater than or equal to 90% of their needs  MONITOR:   PO intake, Supplement acceptance, Labs, Weight trends, Skin, I & O's  REASON FOR ASSESSMENT:   Consult Wound healing  ASSESSMENT:   59 year old Caucasian female resented with bilateral lower extremity cellulitis in the setting of heel wounds. She has history of diabetes. She was also septic when she initially presented to the hospital. Started on antibiotics. Seen by orthopedics. Developed acute and chronic renal failure with diastolic CHF exacerbation. Currently on high-dose Lasix. ID has been consulted.  Pt admitted with severe sepsis secondary to bilateral heel wounds/and lower extremity cellulitis and urinary tract infection.  Hx obtained by pt at bedside. Pt reports good appetite, but admits that she has very specific food preferences due to swallowing difficulties and following a "vegan" diet (however, diet recall is not consistent with vegan diet- pt consumes mainly fresh fruits and vegetables, yogurt, beans, hummus, fish, chicken, milk, and yogurt). Pt is very knowledgeable about diet therapy for diabetes, heart disease, and wound healing. She reports that she tries to maximize protein intake, but expresses frustration about food choices offered to her. The majority of the visit was spent counseling pt on available menu options that are consistent with her food preferences. Also discussed ways to maximize protein intake at home. Meal intake 50-100%.  Pt reports that she is consuming Prostat supplement twice daily and would like to continue with this supplement. Declined offer of Glucerna supplement or increase of Prostat supplement.   She reports no weight loss;  endorses weight gain due to fluid retention. Noted UBW around 250#.   Nutrition-Focused physical exam completed. Findings are no fat depletion, no muscle depletion, and mild edema.   Labs reviewed: K: 3.3 (on supplement).   Diet Order:  Diet heart healthy/carb modified Room service appropriate?: Yes; Fluid consistency:: Thin; Fluid restriction:: 1500 mL Fluid  Skin:  Wound (see comment) (bilateral leg cellulitis, DTI on rt and lt heels)  Last BM:  04/15/15  Height:   Ht Readings from Last 1 Encounters:  04/06/15  (1.626 m)    Weight:   Wt Readings from Last 1 Encounters:  04/15/15 275 lb 2.2 oz (124.8 kg)    Ideal Body Weight:  54.5 kg  BMI:  Body mass index is 47.2 kg/(m^2).  Estimated Nutritional Needs:   Kcal:  1900-2100  Protein:  110-125 grams  Fluid:  1.9-2.1 L  EDUCATION NEEDS:   Education needs addressed  Nalee Lightle A. Mayford Knife, RD, LDN, CDE Pager: [phone] After hours Pager: [phone]

## 2015-04-15 NOTE — Progress Notes (Signed)
Inpatient Diabetes Program Recommendations  AACE/ADA: New Consensus Statement on Inpatient Glycemic Control (2013)  Target Ranges:  Prepandial:   less than 140 mg/dL      Peak postprandial:   less than 180 mg/dL (1-2 hours)      Critically ill patients:  140 - 180 mg/dL   Inpatient Diabetes Program Recommendations Insulin - Basal: consider titrating Lantus down for hypoglycemia this morning  Correction (SSI): add Novolog correction TID per Glycemic Control order-set  Please discontinue ICU Novolog correction scale as this is not appropriate outside of the ICU. Thank you  Piedad Climes BSN, RN,CDE Inpatient Diabetes Coordinator [phone] (team pager)

## 2015-04-15 NOTE — Progress Notes (Signed)
Subjective:  UOP cont to be quite good- creatinine down Objective Vital signs in last 24 hours: Filed Vitals:   04/14/15 2209 04/14/15 2211 04/15/15 0500 04/15/15 0648  BP: 123/41 126/39  115/38  Pulse: 81 84  79  Temp: 98.2 F (36.8 C)   98.3 F (36.8 C)  TempSrc: Oral   Oral  Resp: 22   20  Height:      Weight:   124.8 kg (275 lb 2.2 oz)   SpO2: 100% 100%  100%   Weight change: -2.7 kg (-5 lb 15.2 oz)  Intake/Output Summary (Last 24 hours) at 04/15/15 0916 Last data filed at 04/15/15 4098  Gross per 24 hour  Intake    294 ml  Output   2854 ml  Net  -2560 ml     Assessment/Plan: 59 year old female with multiple medical issues presenting with sepsis related to foot wound- also has developed acute on chronic renal failure this hospitalization 1.Renal- acute on chronic renal failure. Although no significant hypotension recorded I still suspect possible ATN due to initial septic episode versus just some other hemodynamic compromise due to her cardiac and vascular disease. Fortunately, it appears that her urine output is picking up and creatinine is down. Continue lasix at current dose 2. Hypertension/volume - volume overloaded but with [name] reasonable blood pressure. Have stopped Norvasc and have instituted diuretics-  3. Anemia - hemoglobin low and likely not helping. Iron stores low, repleting and have added ESA 4. Malnutrition- with decreased albumin also is not helping volume status- added protein supplement- hypokalemia-add supp 5. Dispo- I agree with her that instituting and maintaining chronic dialysis for her would be very difficult given her basically nonexistent mobility over the last 6 mos- bed to chair only with assist. I sincerely hope that it will not come to that for her 6. Hypokalemia- on supp daily     Kimberly [name]    Labs: Basic Metabolic Panel:  Recent Labs Lab 04/12/15 0558 04/13/15 0533 04/14/15 0550 04/15/15 0544  NA 138 138 142 141  K  4.2 3.6 3.0* 3.3*  CL 106 101 102 97*  CO2 GLUCOSE 126* 98 52* 84  BUN 83* 84* 75* 71*  CREATININE 4.21* 4.39* 4.00* 3.57*  CALCIUM 8.6* 9.0 9.4 9.1  PHOS 5.9* 6.0* 4.8*  --    Liver Function Tests:  Recent Labs Lab 04/12/15 0558 04/13/15 0533 04/14/15 0550  ALBUMIN 2.5* 2.8* 2.7*   No results for input(s): LIPASE, AMYLASE in the last 168 hours. No results for input(s): AMMONIA in the last 168 hours. CBC:  Recent Labs Lab 04/10/15 0520 04/11/15 0546 04/12/15 0558 04/14/15 0550 04/15/15 0544  WBC 10.3 9.7 11.6* 12.1* 9.1  NEUTROABS 8.3*  --   --   --   --   HGB 9.0* 9.1* 8.9* 9.5* 9.1*  HCT 29.0* 29.1* 29.0* 29.9* 29.1*  MCV 93.9 93.6 92.9 93.4 93.0  PLT 192 180 193 211 208   Cardiac Enzymes: No results for input(s): CKTOTAL, CKMB, CKMBINDEX, TROPONINI in the last 168 hours. CBG:  Recent Labs Lab 04/14/15 1126 04/14/15 1622 04/14/15 2254 04/15/15 0740 04/15/15 0813  GLUCAP 144* 91 165* 65 80    Iron Studies:   Recent Labs  04/13/15 0553  IRON 45  TIBC 298  FERRITIN 98   Studies/Results: Dg Foot Complete Left  04/14/2015   CLINICAL DATA:  Diabetic foot ulcer.  EXAM: LEFT FOOT - COMPLETE 3+ VIEW  COMPARISON:  None.  FINDINGS: There is no evidence of fracture or dislocation. There is no evidence of arthropathy or other focal bone abnormality. Generalized osteopenia noted. No evidence of osteolysis or periostitis.  Small plantar and dorsal calcaneal bone spurs noted. Peripheral vascular calcification also demonstrated.  IMPRESSION: No radiographic evidence of osteomyelitis or other acute osseous abnormality.   Electronically Signed   By: [name] M.D.   On: 04/14/2015 19:51   Dg Foot Complete Right  04/14/2015   CLINICAL DATA:  Diabetic foot ulcer.  EXAM: RIGHT FOOT COMPLETE - 3+ VIEW  COMPARISON:  None.  FINDINGS: There is no evidence of fracture or dislocation. There is no evidence of arthropathy or other focal bone abnormality. No evidence  of periostitis or osteolysis. Generalized osteopenia noted. Small plantar and dorsal calcaneal bone spurs noted. Peripheral vascular calcification also seen. Soft tissues are unremarkable.  IMPRESSION: No radiographic evidence of osteomyelitis or other acute osseous abnormality.   Electronically Signed   By: [name] M.D.   On: 04/14/2015 20:00   Medications: Infusions:    Scheduled Medications: . aspirin EC  81 mg Oral Daily  . darbepoetin (ARANESP) injection - NON-DIALYSIS  150 mcg Subcutaneous Q Tue-1800  . feeding supplement (PRO-STAT SUGAR FREE 64)  30 mL Oral BID  . ferric gluconate (FERRLECIT/NULECIT) IV  125 mg Intravenous Daily  . furosemide  120 mg Intravenous Q8H  . heparin  5,000 Units Subcutaneous Q12H  . insulin aspart  2-6 Units Subcutaneous TID WC  . insulin aspart  6 Units Subcutaneous TID WC  . insulin glargine  40 Units Subcutaneous BID  . levothyroxine  75 mcg Oral QAC breakfast  . metoprolol succinate  25 mg Oral Daily  . pantoprazole  40 mg Oral Q0600  . piperacillin-tazobactam (ZOSYN)  IV  3.375 g Intravenous 3 times per day  . potassium chloride  40 mEq Oral Daily    have reviewed scheduled and prn medications.  Physical Exam: General: NAD Heart: RRR Lungs: dec BS at bases Abdomen: obese, non tender Extremities: pitting edema    04/15/2015,9:16 AM  LOS: 9 days

## 2015-04-15 NOTE — Progress Notes (Addendum)
Discussed this case with Dr. Daiva Eves.  We have plenty of cefepime in stock, so will change patient to Cefepime 2 gm IV q24h for CrCl ~ 22 ml/min and weight of 125 kg to cover for possible Strep and his Pseudomonas bacteremia.   Sachi Boulay L. Roseanne Reno, PharmD Clinical Pharmacy Resident Pager: [phone] 04/15/2015 2:12 PM

## 2015-04-15 NOTE — Clinical Social Work Note (Signed)
Clinical Social Worker received referral for medication assistance. Chart reviewed and no other social work needs identified. Spoke with RN Case Manager who will follow up with patient to discuss medication assistance at time of discharge.  CSW signing off - please re consult if social work needs arise.  Macario Golds, [place] [phone]

## 2015-04-15 NOTE — Progress Notes (Signed)
PROGRESS NOTE  Kimberly [name]:[id number] DOB: December 27, 1955 DOA: 04/06/2015 PCP: Kimberly Otto, MD   Brief history of present illness 59 year old Caucasian female resented with bilateral lower extremity cellulitis in the setting of heel wounds. She has history of diabetes. She was also septic when she initially presented to the hospital. Started on antibiotics. Seen by orthopedics. Developed acute and chronic renal failure with diastolic CHF exacerbation. Currently on high-dose Lasix. ID has been consulted.  Assessment/Plan: Severe sepsis secondary to bilateral heel wounds/and lower extremity cellulitis and Bacteremia Patient currently afebrile. Blood pressure has improved. Urine cultures with multiple species. . Patient with clinical improvement.  - Patient could not tolerate intravenous ciprofloxacin due to ?skin reaction.  -Profore compression wraps to bilateral lower extremities.  -heel floaters.  -Patient has been seen in consultation by orthopedics who recommended IV antibiotics, compression wraps, outpatient follow-up.  -Patient will follow-up with the wound care center on discharge. -appreciate ID input-->MRI feet and ABIs  Bacteremia--pseudomonas -continue cefepime -needs 14 days IV abx  Acute on chronic chronic kidney disease stage III May be secondary to ATN secondary to sepsis  -Patient does look volume overloaded on exam..  -Baseline creatinine 1.7-2.0.  - Renal function slowly improving  -Continue current furosemide dose per nephrology-- 120 mg IV every 8 hours. -Nephrology following and appreciate input and recommendations  acute on chronic diastolic CHF exacerbation -Patient was hypotensive at the time of admission and fluid resuscitated -Patient's blood pressure responded to fluids.  -Patient then developed fluid overload.  -Patient's blood pressure has improved.  -daily weights -I/Os -Echo  Bilateral heel wounds/lower extremity cellulitis See  above  History of CVA -continue ASA  Hypothyroidism Continue home dose Synthroid.  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   -Noted to have borderline low blood sugars over the past 48 hours with a hypoglycemic episode  -cut back on the dose of Lantus to 40 units BID . Sliding scale insulin.  Essential hypertension  Stable. Continue metoprolol.  Norvasc has been discontinued  Morbid obesity  Prophylaxis  Heparin for DVT prophylaxis. Reduce dose due to bleeding.  Code Status: DO NOT RESUSCITATE Family Communication: Discussed with patient Disposition Plan: Wait for renal function to improve and finishing ID work up      Procedures/Studies: US Renal  04/12/2015   CLINICAL DATA:  Acute renal failure.  History of diabetes.  EXAM: RENAL / URINARY TRACT ULTRASOUND COMPLETE  COMPARISON:  CT of the abdomen and pelvis 06/10/2014  FINDINGS: Right Kidney:  Length: 10.1 cm. There is poor visualization of the right kidney. No hydronephrosis or mass identified.  Left Kidney:  Length: 13.2 cm. Echogenicity within normal limits. No mass or hydronephrosis visualized.  Bladder:  Appears normal for degree of bladder distention. Study is technically compromised by patient body habitus.  IMPRESSION: No hydronephrosis.   Electronically Signed   By: [name] M.D.   On: 04/12/2015 10:29   Dg Chest Port 1 View  04/06/2015   CLINICAL DATA:  Week and blisters on both legs. Cellulitis. Shortness of breath on exertion.  EXAM: PORTABLE CHEST - 1 VIEW  COMPARISON:  Single view of the chest 06/24/2014.  FINDINGS: There is cardiomegaly and vascular congestion. No consolidative process, pneumothorax or effusion is identified.  IMPRESSION: Cardiomegaly and pulmonary vascular congestion.   Electronically Signed   By: [name] M.D.   On: 04/06/2015 14:31   Dg Foot Complete Left  04/14/2015   CLINICAL DATA:  Diabetic foot ulcer.  EXAM: LEFT FOOT - COMPLETE 3+ VIEW   COMPARISON:  None.  FINDINGS: There is no evidence of fracture or dislocation. There is no evidence of arthropathy or other focal bone abnormality. Generalized osteopenia noted. No evidence of osteolysis or periostitis.  Small plantar and dorsal calcaneal bone spurs noted. Peripheral vascular calcification also demonstrated.  IMPRESSION: No radiographic evidence of osteomyelitis or other acute osseous abnormality.   Electronically Signed   By: [name] M.D.   On: 04/14/2015 19:51   Dg Foot Complete Right  04/14/2015   CLINICAL DATA:  Diabetic foot ulcer.  EXAM: RIGHT FOOT COMPLETE - 3+ VIEW  COMPARISON:  None.  FINDINGS: There is no evidence of fracture or dislocation. There is no evidence of arthropathy or other focal bone abnormality. No evidence of periostitis or osteolysis. Generalized osteopenia noted. Small plantar and dorsal calcaneal bone spurs noted. Peripheral vascular calcification also seen. Soft tissues are unremarkable.  IMPRESSION: No radiographic evidence of osteomyelitis or other acute osseous abnormality.   Electronically Signed   By: [name] M.D.   On: 04/14/2015 20:00         Subjective: Patient is having some loose stools. Denies any fever, chills, chest pain, nausea, vomiting, hematochezia, melena. Shortness of breath continues but it is improving. Denies any coughing or hemoptysis. Denies any headaches.  Objective: Filed Vitals:   04/14/15 2211 04/15/15 0500 04/15/15 0648 04/15/15 1347  BP: 126/39  115/38 127/41  Pulse: 84  79 87  Temp:   98.3 F (36.8 C) 98.3 F (36.8 C)  TempSrc:   Oral Oral  Resp:   20   Height:      Weight:  124.8 kg (275 lb 2.2 oz)    SpO2: 100%  100% 99%    Intake/Output Summary (Last 24 hours) at 04/15/15 1848 Last data filed at 04/15/15 1810  Gross per 24 hour  Intake   1012 ml  Output   2753 ml  Net  -1741 ml   Weight change: -2.7 kg (-5 lb 15.2 oz) Exam:   General:  Pt is alert, follows commands appropriately, not in  acute distress  HEENT: No icterus, No thrush, No neck mass, [place]/AT  Cardiovascular: RRR, S1/S2, no rubs, no gallops  Respiratory: CTA bilaterally, no wheezing, no crackles, no rhonchi  Abdomen: Soft/+BS, non tender, non distended, no guarding  Extremities: 2+ edema, No lymphangitis, No petechiae, No rashes, no synovitis;  UNNA boots in place  Data Reviewed: Basic Metabolic Panel:  Recent Labs Lab 04/11/15 0546 04/12/15 0558 04/13/15 0533 04/14/15 0550 04/15/15 0544  NA 139 138 138 142 141  K 4.3 4.2 3.6 3.0* 3.3*  CL 106 106 101 102 97*  CO2 22 24 23 29 31   GLUCOSE 67 126* 98 52* 84  BUN 82* 83* 84* 75* 71*  CREATININE 3.59* 4.21* 4.39* 4.00* 3.57*  CALCIUM 8.5* 8.6* 9.0 9.4 9.1  MG  --  2.3  --   --   --   PHOS  --  5.9* 6.0* 4.8*  --    Liver Function Tests:  Recent Labs Lab 04/12/15 0558 04/13/15 0533 04/14/15 0550  ALBUMIN 2.5* 2.8* 2.7*   No results for input(s): LIPASE, AMYLASE in the last 168 hours. No results for input(s): AMMONIA in the last 168 hours. CBC:  Recent Labs Lab 04/10/15 0520 04/11/15 0546 04/12/15 0558 04/14/15 0550 04/15/15 0544  WBC 10.3 9.7 11.6* 12.1* 9.1  NEUTROABS 8.3*  --   --   --   --  HGB 9.0* 9.1* 8.9* 9.5* 9.1*  HCT 29.0* 29.1* 29.0* 29.9* 29.1*  MCV 93.9 93.6 92.9 93.4 93.0  PLT 192 180 193 211 208   Cardiac Enzymes: No results for input(s): CKTOTAL, CKMB, CKMBINDEX, TROPONINI in the last 168 hours. BNP: Invalid input(s): POCBNP CBG:  Recent Labs Lab 04/14/15 2254 04/15/15 0740 04/15/15 0813 04/15/15 1121 04/15/15 1650  GLUCAP 165* 65 80 181* 157*    Recent Results (from the past 240 hour(s))  Culture, blood (routine x 2)     Status: None   Collection Time: 04/06/15  2:13 PM  Result Value Ref Range Status   Specimen Description BLOOD ARM RIGHT  Final   Special Requests BOTTLES DRAWN AEROBIC AND ANAEROBIC 5CC  Final   Culture  Setup Time   Final    GRAM NEGATIVE RODS AEROBIC BOTTLE ONLY CRITICAL  RESULT CALLED TO, READ BACK BY AND VERIFIED WITH: Burnard Hawthorne, RN AT 1133 ON [id number] BY Lucienne Capers    Culture PSEUDOMONAS AERUGINOSA  Final   Report Status 04/09/2015 FINAL  Final   Organism ID, Bacteria PSEUDOMONAS AERUGINOSA  Final      Susceptibility   Pseudomonas aeruginosa - MIC*    CEFTAZIDIME 2 SENSITIVE Sensitive     CIPROFLOXACIN <=0.25 SENSITIVE Sensitive     GENTAMICIN <=1 SENSITIVE Sensitive     IMIPENEM 1 SENSITIVE Sensitive     PIP/TAZO <=4 SENSITIVE Sensitive     CEFEPIME 2 SENSITIVE Sensitive     * PSEUDOMONAS AERUGINOSA  Culture, Urine     Status: None   Collection Time: 04/06/15  2:52 PM  Result Value Ref Range Status   Specimen Description URINE, RANDOM  Final   Special Requests ADDED [id number]  Final   Culture MULTIPLE SPECIES PRESENT, SUGGEST RECOLLECTION  Final   Report Status 04/09/2015 FINAL  Final  Culture, blood (routine x 2)     Status: None   Collection Time: 04/06/15  3:00 PM  Result Value Ref Range Status   Specimen Description BLOOD LEFT HAND  Final   Special Requests BOTTLES DRAWN AEROBIC AND ANAEROBIC  Final   Culture NO GROWTH 5 DAYS  Final   Report Status 04/11/2015 FINAL  Final  MRSA PCR Screening     Status: Abnormal   Collection Time: 04/06/15 10:17 PM  Result Value Ref Range Status   MRSA by PCR POSITIVE (A) NEGATIVE Final    Comment:        The GeneXpert MRSA Assay (FDA approved for NASAL specimens only), is one component of a comprehensive MRSA colonization surveillance program. It is not intended to diagnose MRSA infection nor to guide or monitor treatment for MRSA infections. RESULT CALLED TO, READ BACK BY AND VERIFIED WITH: Tyler Aas RN [id number] 04/07/15 MITCHELL,L      Scheduled Meds: . aspirin EC  81 mg Oral Daily  . ceFEPime (MAXIPIME) IV  2 g Intravenous Q24H  . darbepoetin (ARANESP) injection - NON-DIALYSIS  150 mcg Subcutaneous Q Tue-1800  . feeding supplement (PRO-STAT SUGAR FREE 64)  30 mL Oral BID  .  furosemide  120 mg Intravenous Q8H  . heparin  5,000 Units Subcutaneous Q12H  . insulin aspart  0-5 Units Subcutaneous QHS  . insulin aspart  0-9 Units Subcutaneous TID WC  . insulin glargine  40 Units Subcutaneous BID  . levothyroxine  75 mcg Oral QAC breakfast  . metoprolol succinate  25 mg Oral Daily  . multivitamin with minerals  1 tablet Oral Daily  . pantoprazole  40 mg Oral Q0600  . potassium chloride  40 mEq Oral Daily   Continuous Infusions:    Raine Blodgett, DO  [facility] Pager [phone]  If 7PM-7AM, please contact night-coverage [email or web address] Password [email or web address] 04/15/2015, 6:48 PM   LOS: 9 days

## 2015-04-16 ENCOUNTER — Ambulatory Visit (HOSPITAL_COMMUNITY): Payer: Managed Care, Other (non HMO)

## 2015-04-16 DIAGNOSIS — I509 Heart failure, unspecified: Secondary | ICD-10-CM

## 2015-04-16 DIAGNOSIS — E1129 Type 2 diabetes mellitus with other diabetic kidney complication: Secondary | ICD-10-CM

## 2015-04-16 DIAGNOSIS — E08621 Diabetes mellitus due to underlying condition with foot ulcer: Secondary | ICD-10-CM

## 2015-04-16 LAB — BASIC METABOLIC PANEL
ANION GAP: 12 (ref 5–15)
BUN: 65 mg/dL — AB (ref 6–20)
CO2: 34 mmol/L — ABNORMAL HIGH (ref 22–32)
Calcium: 9.1 mg/dL (ref 8.9–10.3)
Chloride: 95 mmol/L — ABNORMAL LOW (ref 101–111)
Creatinine, Ser: 3.49 mg/dL — ABNORMAL HIGH (ref 0.44–1.00)
GFR calc Af Amer: 15 mL/min — ABNORMAL LOW (ref >60)
GFR calc non Af Amer: 13 mL/min — ABNORMAL LOW (ref >60)
Glucose, Bld: 142 mg/dL — ABNORMAL HIGH (ref 65–99)
Potassium: 3.4 mmol/L — ABNORMAL LOW (ref 3.5–5.1)
SODIUM: 141 mmol/L (ref 135–145)

## 2015-04-16 LAB — CBC
HCT: 30.4 % — ABNORMAL LOW (ref 36.0–46.0)
Hemoglobin: 9.4 g/dL — ABNORMAL LOW (ref 12.0–15.0)
MCH: 29.1 pg (ref 26.0–34.0)
MCHC: 30.9 g/dL (ref 30.0–36.0)
MCV: 94.1 fL (ref 78.0–100.0)
PLATELETS: 187 10*3/uL (ref 150–400)
RBC: 3.23 MIL/uL — ABNORMAL LOW (ref 3.87–5.11)
RDW: 16.8 % — AB (ref 11.5–15.5)
WBC: 9.2 10*3/uL (ref 4.0–10.5)

## 2015-04-16 LAB — HEMOGLOBIN A1C
Hgb A1c MFr Bld: 8.7 % — ABNORMAL HIGH (ref 4.8–5.6)
MEAN PLASMA GLUCOSE: 203 mg/dL

## 2015-04-16 LAB — GLUCOSE, CAPILLARY
GLUCOSE-CAPILLARY: 148 mg/dL — AB (ref 65–99)
Glucose-Capillary: 225 mg/dL — ABNORMAL HIGH (ref 65–99)
Glucose-Capillary: 219 mg/dL — ABNORMAL HIGH (ref 65–99)
Glucose-Capillary: 261 mg/dL — ABNORMAL HIGH (ref 65–99)

## 2015-04-16 LAB — HCV COMMENT:

## 2015-04-16 LAB — HEPATITIS C ANTIBODY (REFLEX): HCV Ab: 0.2 s/co ratio (ref 0.0–0.9)

## 2015-04-16 MED ORDER — PERFLUTREN LIPID MICROSPHERE
1.0000 mL | INTRAVENOUS | Status: AC | PRN
  Administered 2015-04-16: 2 mL via INTRAVENOUS
  Filled 2015-04-16: qty 10

## 2015-04-16 NOTE — Progress Notes (Signed)
PT Cancellation Note  Patient Details Name: Kimberly [name] MRN: [id number] DOB: July 08, 1956   Cancelled Treatment:    Reason Eval/Treat Not Completed: Other (comment).  Awaiting dopplers and post-op boots to protect heel wounds.  Will defer treatment until tomorrow. Thanks,   Kimberly [name]. Kimberly [name], PT, DPT [phone]   04/16/2015, 4:07 PM

## 2015-04-16 NOTE — Progress Notes (Signed)
PROGRESS NOTE  Kimberly [name] KTG:[id number] DOB: March 25, 1956 DOA: 04/06/2015 PCP: [name], MD    Brief history of present illness 59 year old Caucasian female resented with bilateral lower extremity cellulitis in the setting of heel wounds. She has history of diabetes. She was also septic when she initially presented to the hospital. Started on antibiotics. Seen by orthopedics. Developed acute and chronic renal failure with diastolic CHF exacerbation. Currently on high-dose Lasix. ID has been consulted.  Assessment/Plan: Severe sepsis secondary to bilateral heel wounds/and lower extremity cellulitis and Bacteremia Patient currently afebrile. Blood pressure has improved. Urine cultures with multiple species. . Patient with clinical improvement.  - Patient could not tolerate intravenous ciprofloxacin due to ?skin reaction.  -Profore compression wraps to bilateral lower extremities.  -heel floaters.  -Patient has been seen in consultation by orthopedics who recommended IV antibiotics, compression wraps, outpatient follow-up.  -Patient will follow-up with the wound care center on discharge. -appreciate ID input-->MRI feet and ABIs -MRI bilateral feet negative for ostium myelitis or abscess -ABIs pending -CRP--3.8, ESR 110  Bacteremia--pseudomonas -continue cefepime -needs 14 days IV abx with 04/07/2015 as day #1 -04/16/2015 echocardiogram--results pending  Acute on chronic chronic kidney disease stage III May be secondary to ATN secondary to sepsis and hemodynamic changes  -Patient does look volume overloaded on exam..  -Baseline creatinine 1.7-2.0.  - Renal function slowly improving  -Continue current furosemide dose per nephrology-- 120 mg IV every 8 hours. -Nephrology following and appreciate input and recommendations  acute on chronic diastolic CHF exacerbation -Patient was hypotensive at the time of admission and fluid resuscitated -Patient's blood  pressure responded to fluids.  -Patient then developed fluid overload.  -Patient's blood pressure has improved.  -daily weights--neg 11 pounds in past 3 days -I/Os--neg 5.7L -Echo--results pending  Bilateral heel wounds/lower extremity cellulitis See above  History of CVA -continue ASA  Hypothyroidism Continue home dose Synthroid.  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  -Noted to have borderline low blood sugars over the past 48 hours with a hypoglycemic episode  -cut back on the dose of Lantus to 40 units BID  -Increased to moderate sliding scale  Essential hypertension  Stable. Continue metoprolol succinate Norvasc has been discontinued  Morbid obesity  Prophylaxis  Heparin for DVT prophylaxis. Reduce dose due to bleeding.  Code Status: DO NOT RESUSCITATE Family Communication: Discussed with patient Disposition Plan: Wait for renal function to improve and finishing ID work up  Procedures/Studies: US Renal  04/12/2015   CLINICAL DATA:  Acute renal failure.  History of diabetes.  EXAM: RENAL / URINARY TRACT ULTRASOUND COMPLETE  COMPARISON:  CT of the abdomen and pelvis 06/10/2014  FINDINGS: Right Kidney:  Length: 10.1 cm. There is poor visualization of the right kidney. No hydronephrosis or mass identified.  Left Kidney:  Length: 13.2 cm. Echogenicity within normal limits. No mass or hydronephrosis visualized.  Bladder:  Appears normal for degree of bladder distention. Study is technically compromised by patient body habitus.  IMPRESSION: No hydronephrosis.   Electronically Signed   By: Nolon Nations M.D.   On: 04/12/2015 10:29   Mri Right Foot Without Contrast  04/16/2015   CLINICAL DATA:  Diabetic patient with bilateral heel wounds. Sepsis.  EXAM: MRI OF THE RIGHT FOREFOOT WITHOUT CONTRAST; MRI OF THE LEFT FOREFOOT WITHOUT CONTRAST  TECHNIQUE: Multiplanar, multisequence MR imaging was performed. No intravenous contrast was  administered.  COMPARISON:  Plain films left  foot 04/14/2015.  FINDINGS: There is no bone marrow signal abnormality to suggest osteomyelitis. Mild subcutaneous edema is seen over the dorsum of the foot. The patient's reported heel wound is not well demonstrated on this study. No focal fluid collection is identified. Intrinsic musculature the foot is atrophied. No muscle or tendon tear is identified. No notable arthropathy is seen.  IMPRESSION: Negative for abscess or osteomyelitis. Subcutaneous edema is seen over the dorsum of the foot.   Electronically Signed   By: Inge Rise M.D.   On: 04/16/2015 08:21   Mri Left Foot Without Contrast  04/16/2015   CLINICAL DATA:  Diabetic patient with bilateral heel wounds. Sepsis.  EXAM: MRI OF THE RIGHT FOREFOOT WITHOUT CONTRAST; MRI OF THE LEFT FOREFOOT WITHOUT CONTRAST  TECHNIQUE: Multiplanar, multisequence MR imaging was performed. No intravenous contrast was administered.  COMPARISON:  Plain films left foot 04/14/2015.  FINDINGS: There is no bone marrow signal abnormality to suggest osteomyelitis. Mild subcutaneous edema is seen over the dorsum of the foot. The patient's reported heel wound is not well demonstrated on this study. No focal fluid collection is identified. Intrinsic musculature the foot is atrophied. No muscle or tendon tear is identified. No notable arthropathy is seen.  IMPRESSION: Negative for abscess or osteomyelitis. Subcutaneous edema is seen over the dorsum of the foot.   Electronically Signed   By: Inge Rise M.D.   On: 04/16/2015 08:21   Dg Chest Port 1 View  04/06/2015   CLINICAL DATA:  Week and blisters on both legs. Cellulitis. Shortness of breath on exertion.  EXAM: PORTABLE CHEST - 1 VIEW  COMPARISON:  Single view of the chest 06/24/2014.  FINDINGS: There is cardiomegaly and vascular congestion. No consolidative process, pneumothorax or effusion is identified.  IMPRESSION: Cardiomegaly and pulmonary vascular congestion.    Electronically Signed   By: Inge Rise M.D.   On: 04/06/2015 14:31   Dg Foot Complete Left  04/14/2015   CLINICAL DATA:  Diabetic foot ulcer.  EXAM: LEFT FOOT - COMPLETE 3+ VIEW  COMPARISON:  None.  FINDINGS: There is no evidence of fracture or dislocation. There is no evidence of arthropathy or other focal bone abnormality. Generalized osteopenia noted. No evidence of osteolysis or periostitis.  Small plantar and dorsal calcaneal bone spurs noted. Peripheral vascular calcification also demonstrated.  IMPRESSION: No radiographic evidence of osteomyelitis or other acute osseous abnormality.   Electronically Signed   By: Earle Gell M.D.   On: 04/14/2015 19:51   Dg Foot Complete Right  04/14/2015   CLINICAL DATA:  Diabetic foot ulcer.  EXAM: RIGHT FOOT COMPLETE - 3+ VIEW  COMPARISON:  None.  FINDINGS: There is no evidence of fracture or dislocation. There is no evidence of arthropathy or other focal bone abnormality. No evidence of periostitis or osteolysis. Generalized osteopenia noted. Small plantar and dorsal calcaneal bone spurs noted. Peripheral vascular calcification also seen. Soft tissues are unremarkable.  IMPRESSION: No radiographic evidence of osteomyelitis or other acute osseous abnormality.   Electronically Signed   By: Earle Gell M.D.   On: 04/14/2015 20:00         Subjective: Denies any fevers, chills, chest pain, nausea, vomiting, diarrhea, abdominal pain. Has some shortness of breath still but it is improving. Denies any headache or visual disturbance.  Objective: Filed Vitals:   04/15/15 2228 04/16/15 0622 04/16/15 0627 04/16/15 1300  BP: 130/43  113/41 133/34  Pulse: 86  84 81  Temp: 98.6 F (37 C)  98.5 F (  36.9 C) 98.5 F (36.9 C)  TempSrc: Oral  Oral Oral  Resp: _0 Height:      Weight:  122.743 kg (270 lb 9.6 oz)    SpO2: 98%  100% 100%    Intake/Output Summary (Last 24 hours) at 04/16/15 1706 Last data filed at 04/16/15 1307  Gross per 24 hour    Intake    458 ml  Output   1300 ml  Net   -842 ml   Weight change: -2.057 kg (-4 lb 8.6 oz) Exam:   General:  Pt is alert, follows commands appropriately, not in acute distress  HEENT: No icterus, No thrush, No neck mass, [place]/AT  Cardiovascular: RRR, S1/S2, no rubs, no gallops  Respiratory: Bibasilar crackles. No wheezing  Abdomen: Soft/+BS, non tender, non distended, no guarding; no hepatosplenomegaly  Extremities: 2+ LE edema, No lymphangitis, No petechiae, No rashes, no synovitis;  [place] wraps on bilateral LE; no cyanosis or clubbing  Data Reviewed: Basic Metabolic Panel:  Recent Labs Lab 04/12/15 0558 04/13/15 0533 04/14/15 0550 04/15/15 0544 04/16/15 0600  NA 138 138 142 141 141  K 4.2 3.6 3.0* 3.3* 3.4*  CL 106 101 102 97* 95*  CO2 _1 34*  GLUCOSE 126* 98 52* 84 142*  BUN 83* 84* 75* 71* 65*  CREATININE 4.21* 4.39* 4.00* 3.57* 3.49*  CALCIUM 8.6* 9.0 9.4 9.1 9.1  MG 2.3  --   --   --   --   PHOS 5.9* 6.0* 4.8*  --   --    Liver Function Tests:  Recent Labs Lab 04/12/15 0558 04/13/15 0533 04/14/15 0550  ALBUMIN 2.5* 2.8* 2.7*   No results for input(s): LIPASE, AMYLASE in the last 168 hours. No results for input(s): AMMONIA in the last 168 hours. CBC:  Recent Labs Lab 04/10/15 0520 04/11/15 0546 04/12/15 0558 04/14/15 0550 04/15/15 0544 04/16/15 0600  WBC 10.3 9.7 11.6* 12.1* 9.1 9.2  NEUTROABS 8.3*  --   --   --   --   --   HGB 9.0* 9.1* 8.9* 9.5* 9.1* 9.4*  HCT 29.0* 29.1* 29.0* 29.9* 29.1* 30.4*  MCV 93.9 93.6 92.9 93.4 93.0 94.1  PLT 192 180 193 211 208 187   Cardiac Enzymes: No results for input(s): CKTOTAL, CKMB, CKMBINDEX, TROPONINI in the last 168 hours. BNP: Invalid input(s): POCBNP CBG:  Recent Labs Lab 04/15/15 1650 04/15/15 2223 04/16/15 0747 04/16/15 1147 04/16/15 1641  GLUCAP 157* 275* 148* 225* 219*    Recent Results (from the past 240 hour(s))  MRSA PCR Screening     Status: Abnormal   Collection  Time: 04/06/15 10:17 PM  Result Value Ref Range Status   MRSA by PCR POSITIVE (A) NEGATIVE Final    Comment:        The GeneXpert MRSA Assay (FDA approved for NASAL specimens only), is one component of a comprehensive MRSA colonization surveillance program. It is not intended to diagnose MRSA infection nor to guide or monitor treatment for MRSA infections. RESULT CALLED TO, READ BACK BY AND VERIFIED WITH: Candie Chroman RN [id number] 04/07/15 MITCHELL,L      Scheduled Meds: . aspirin EC  81 mg Oral Daily  . ceFEPime (MAXIPIME) IV  2 g Intravenous Q24H  . darbepoetin (ARANESP) injection - NON-DIALYSIS  150 mcg Subcutaneous Q Tue-1800  . feeding supplement (PRO-STAT SUGAR FREE 64)  30 mL Oral BID  . furosemide  120 mg Intravenous Q8H  . heparin  5,000 Units  Subcutaneous Q12H  . insulin aspart  0-5 Units Subcutaneous QHS  . insulin aspart  0-9 Units Subcutaneous TID WC  . insulin glargine  40 Units Subcutaneous BID  . levothyroxine  75 mcg Oral QAC breakfast  . metoprolol succinate  25 mg Oral Daily  . multivitamin with minerals  1 tablet Oral Daily  . pantoprazole  40 mg Oral Q0600  . potassium chloride  40 mEq Oral Daily   Continuous Infusions:    Bader Stubblefield, DO  [facility] Pager [phone]  If 7PM-7AM, please contact night-coverage [email or web address] Password [email or web address] 04/16/2015, 5:06 PM   LOS: 10 days

## 2015-04-16 NOTE — Progress Notes (Signed)
Subjective:  UOP cont to be quite good- creatinine down Objective Vital signs in last 24 hours: Filed Vitals:   04/15/15 1347 04/15/15 2228 04/16/15 0622 04/16/15 0627  BP: 127/41 130/43  113/41  Pulse: 87 86  84  Temp: 98.3 F (36.8 C) 98.6 F (37 C)  98.5 F (36.9 C)  TempSrc: Oral Oral  Oral  Resp:  17  20  Height:      Weight:   122.743 kg (270 lb 9.6 oz)   SpO2: 99% 98%  100%   Weight change: -2.057 kg (-4 lb 8.6 oz)  Intake/Output Summary (Last 24 hours) at 04/16/15 1120 Last data filed at 04/16/15 1015  Gross per 24 hour  Intake    580 ml  Output   1901 ml  Net  -1321 ml     Assessment/Plan: 59 year old female with multiple medical issues presenting with sepsis related to foot wound- also has developed acute on chronic renal failure this hospitalization 1.Renal- acute on chronic renal failure. Although no significant hypotension recorded I still suspect possible ATN due to initial septic episode versus just some other hemodynamic compromise due to her cardiac and vascular disease. Fortunately, it appears that her urine output is picking up and creatinine is down. Continue lasix at current dose IV 2. Hypertension/volume - volume overloaded but with a reasonable blood pressure. Have stopped Norvasc  3. Anemia - hemoglobin low and likely not helping. Iron stores low, repleting and have added ESA 4. Malnutrition- with decreased albumin also is not helping volume status- added protein supplement-  5. Dispo- I agree with her that instituting and maintaining chronic dialysis for her would be very difficult given her basically nonexistent mobility over the last 6 mos- bed to chair only with assist. I sincerely hope that it will not come to that for her 6. Hypokalemia- on supp daily- improving slowly      Kimberly [name] A    Labs: Basic Metabolic Panel:  Recent Labs Lab 04/12/15 0558 04/13/15 0533 04/14/15 0550 04/15/15 0544 04/16/15 0600  NA 138 138 142 141  141  K 4.2 3.6 3.0* 3.3* 3.4*  CL 106 101 102 97* 95*  CO2 24 23 29 31  34*  GLUCOSE 126* 98 52* 84 142*  BUN 83* 84* 75* 71* 65*  CREATININE 4.21* 4.39* 4.00* 3.57* 3.49*  CALCIUM 8.6* 9.0 9.4 9.1 9.1  PHOS 5.9* 6.0* 4.8*  --   --    Liver Function Tests:  Recent Labs Lab 04/12/15 0558 04/13/15 0533 04/14/15 0550  ALBUMIN 2.5* 2.8* 2.7*   No results for input(s): LIPASE, AMYLASE in the last 168 hours. No results for input(s): AMMONIA in the last 168 hours. CBC:  Recent Labs Lab 04/10/15 0520 04/11/15 0546 04/12/15 0558 04/14/15 0550 04/15/15 0544 04/16/15 0600  WBC 10.3 9.7 11.6* 12.1* 9.1 9.2  NEUTROABS 8.3*  --   --   --   --   --   HGB 9.0* 9.1* 8.9* 9.5* 9.1* 9.4*  HCT 29.0* 29.1* 29.0* 29.9* 29.1* 30.4*  MCV 93.9 93.6 92.9 93.4 93.0 94.1  PLT 192 180 193 211 208 187   Cardiac Enzymes: No results for input(s): CKTOTAL, CKMB, CKMBINDEX, TROPONINI in the last 168 hours. CBG:  Recent Labs Lab 04/15/15 0813 04/15/15 1121 04/15/15 1650 04/15/15 2223 04/16/15 0747  GLUCAP 80 181* 157* 275* 148*    Iron Studies:  No results for input(s): IRON, TIBC, TRANSFERRIN, FERRITIN in the last 72 hours. Studies/Results: Mri Right Foot Without Contrast  04/16/2015   CLINICAL DATA:  Diabetic patient with bilateral heel wounds. Sepsis.  EXAM: MRI OF THE RIGHT FOREFOOT WITHOUT CONTRAST; MRI OF THE LEFT FOREFOOT WITHOUT CONTRAST  TECHNIQUE: Multiplanar, multisequence MR imaging was performed. No intravenous contrast was administered.  COMPARISON:  Plain films left foot 04/14/2015.  FINDINGS: There is no bone marrow signal abnormality to suggest osteomyelitis. Mild subcutaneous edema is seen over the dorsum of the foot. The patient's reported heel wound is not well demonstrated on this study. No focal fluid collection is identified. Intrinsic musculature the foot is atrophied. No muscle or tendon tear is identified. No notable arthropathy is seen.  IMPRESSION: Negative for abscess  or osteomyelitis. Subcutaneous edema is seen over the dorsum of the foot.   Electronically Signed   By: [name] M.D.   On: 04/16/2015 08:21   Mri Left Foot Without Contrast  04/16/2015   CLINICAL DATA:  Diabetic patient with bilateral heel wounds. Sepsis.  EXAM: MRI OF THE RIGHT FOREFOOT WITHOUT CONTRAST; MRI OF THE LEFT FOREFOOT WITHOUT CONTRAST  TECHNIQUE: Multiplanar, multisequence MR imaging was performed. No intravenous contrast was administered.  COMPARISON:  Plain films left foot 04/14/2015.  FINDINGS: There is no bone marrow signal abnormality to suggest osteomyelitis. Mild subcutaneous edema is seen over the dorsum of the foot. The patient's reported heel wound is not well demonstrated on this study. No focal fluid collection is identified. Intrinsic musculature the foot is atrophied. No muscle or tendon tear is identified. No notable arthropathy is seen.  IMPRESSION: Negative for abscess or osteomyelitis. Subcutaneous edema is seen over the dorsum of the foot.   Electronically Signed   By: [name] M.D.   On: 04/16/2015 08:21   Dg Foot Complete Left  04/14/2015   CLINICAL DATA:  Diabetic foot ulcer.  EXAM: LEFT FOOT - COMPLETE 3+ VIEW  COMPARISON:  None.  FINDINGS: There is no evidence of fracture or dislocation. There is no evidence of arthropathy or other focal bone abnormality. Generalized osteopenia noted. No evidence of osteolysis or periostitis.  Small plantar and dorsal calcaneal bone spurs noted. Peripheral vascular calcification also demonstrated.  IMPRESSION: No radiographic evidence of osteomyelitis or other acute osseous abnormality.   Electronically Signed   By: [name] M.D.   On: 04/14/2015 19:51   Dg Foot Complete Right  04/14/2015   CLINICAL DATA:  Diabetic foot ulcer.  EXAM: RIGHT FOOT COMPLETE - 3+ VIEW  COMPARISON:  None.  FINDINGS: There is no evidence of fracture or dislocation. There is no evidence of arthropathy or other focal bone abnormality. No evidence  of periostitis or osteolysis. Generalized osteopenia noted. Small plantar and dorsal calcaneal bone spurs noted. Peripheral vascular calcification also seen. Soft tissues are unremarkable.  IMPRESSION: No radiographic evidence of osteomyelitis or other acute osseous abnormality.   Electronically Signed   By: [name] M.D.   On: 04/14/2015 20:00   Medications: Infusions:    Scheduled Medications: . aspirin EC  81 mg Oral Daily  . ceFEPime (MAXIPIME) IV  2 g Intravenous Q24H  . darbepoetin (ARANESP) injection - NON-DIALYSIS  150 mcg Subcutaneous Q Tue-1800  . feeding supplement (PRO-STAT SUGAR FREE 64)  30 mL Oral BID  . furosemide  120 mg Intravenous Q8H  . heparin  5,000 Units Subcutaneous Q12H  . insulin aspart  0-5 Units Subcutaneous QHS  . insulin aspart  0-9 Units Subcutaneous TID WC  . insulin glargine  40 Units Subcutaneous BID  . levothyroxine  75 mcg  Oral QAC breakfast  . metoprolol succinate  25 mg Oral Daily  . multivitamin with minerals  1 tablet Oral Daily  . pantoprazole  40 mg Oral Q0600  . potassium chloride  40 mEq Oral Daily    have reviewed scheduled and prn medications.  Physical Exam: General: NAD Heart: RRR Lungs: dec BS at bases Abdomen: obese, non tender Extremities: pitting edema    04/16/2015,11:20 AM  LOS: 10 days

## 2015-04-16 NOTE — Progress Notes (Signed)
[facility]    Date of Admission:  04/06/2015   Total days of antibiotics: 11 days        Day 2 Cefepime  Principal Problem:   Severe sepsis Active Problems:   DM (diabetes mellitus), type 2 with renal complications   Essential hypertension   Peripheral vascular disease   CKD (chronic kidney disease), stage III   On home oxygen therapy   Hypothyroidism   Morbid obesity   Pressure ulcer   Acute exacerbation of CHF (congestive heart failure)   Diabetic foot infection   UTI (urinary tract infection)   Blood poisoning   Acute on chronic diastolic heart failure   Renal failure (ARF), acute on chronic   ARF (acute renal failure)   Diabetic foot ulcer   Pseudomonal bacteremia   Diabetic ulcer of right foot associated with diabetes mellitus due to underlying condition   . aspirin EC  81 mg Oral Daily  . ceFEPime (MAXIPIME) IV  2 g Intravenous Q24H  . darbepoetin (ARANESP) injection - NON-DIALYSIS  150 mcg Subcutaneous Q Tue-1800  . feeding supplement (PRO-STAT SUGAR FREE 64)  30 mL Oral BID  . furosemide  120 mg Intravenous Q8H  . heparin  5,000 Units Subcutaneous Q12H  . insulin aspart  0-5 Units Subcutaneous QHS  . insulin aspart  0-9 Units Subcutaneous TID WC  . insulin glargine  40 Units Subcutaneous BID  . levothyroxine  75 mcg Oral QAC breakfast  . metoprolol succinate  25 mg Oral Daily  . multivitamin with minerals  1 tablet Oral Daily  . pantoprazole  40 mg Oral Q0600  . potassium chloride  40 mEq Oral Daily    Subjective: States she is feeling well today. Reports no fevers or chills overnight.   Review of Systems: General: Denies chills, fever, fatigue Skin: States she thinks her cellulitis is improving since starting IV antibiotics and diuresis. Resp: Resports decreasing shortness of breath. CV: denies chest pain. Edema improving. GI: States some loosely formed stool she believes r/t antibiotics. Denies watery diarrhea  multiple times through the day. Neuro: Positive for bilateral foot neuropathy.   Past Medical History  Diagnosis Date  . HYPERLIPIDEMIA   . HYPERTENSION   . CAD, NATIVE VESSEL     May 10, 2010 cath showed a hyperdynamic LV function, she had dominant circumflex anatomy with a 70-80% small OM1. She had diffuse diabetic plaque particularly in the distal LAD. She nondominant RCA.  Nondominant  . PVD     CEA  . COPD   . Edema   . CHF (congestive heart failure)     Preserved EF  . Cellulitis 10/15/2013    BILATERAL  . Diabetic neuropathy   . Anasarca 05/2014  . Type II diabetes mellitus   . Proliferative retinopathy     hx/notes 01/27/2010  . Peripheral neuropathy     hx/notes 01/27/2010  . Complication of anesthesia     "they had trouble reviving me after the latest carotid OR" (04/06/2015)  . Heart attack     "on the table when I had my last carotid OR" (04/06/2015)  . Asthma   . On home oxygen therapy     "2.5L; 24/7" (04/06/2015)  . Pneumonia "several times"  . Chronic bronchitis     "often; usually q yr" (04/06/2015)  . Hypothyroidism   . History of hiatal hernia   . GERD (gastroesophageal reflux disease)   . 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 Arthritis     "aches and pains all over" (04/06/2015)  . Anxiety   . Depression   . Chronic kidney disease     "they've had me on dialysis periodically" (04/06/2015)  . Paralyzed vocal cords     History  Substance Use Topics  . Smoking status: Former Smoker -- 1.00 packs/day for 20 years    Types: Cigarettes    Quit date: 09/11/2005  . Smokeless tobacco: Never Used  . Alcohol Use: No    Family History  Problem Relation Age of Onset  . Cancer Mother     Breast, NHL  . Stroke Mother   . Peripheral vascular disease Father   . CAD Father 10  . Heart attack Father   . Hypertension Father   . Asthma Father      Allergies  Allergen Reactions  . Ciprofloxacin Itching  . Epinephrine     Increased heart rate    OBJECTIVE: Blood pressure 133/34, pulse 81, temperature 98.5 F (36.9 C), temperature source Oral, resp. rate 20, height  (1.626 m), weight 270 lb 9.6 oz (122.743 kg), SpO2 100 %. General: NAD Skin: Legs currently wrapped in dressings. Toes very dry and skin flaking. Onchymycosis in nails. PVD skin color changes to BLE. Eschar to left pinky toe unchanged from exam yesterday. Lungs: Fine crackles to bases of lungs. Cor: RRR Abdomen: Soft, nontender. Active bowel sounds X4.  Lab Results Lab Results  Component Value Date   WBC 9.2 04/16/2015   HGB 9.4* 04/16/2015   HCT 30.4* 04/16/2015   MCV 94.1 04/16/2015   PLT 187 04/16/2015    Lab Results  Component Value Date   CREATININE 3.49* 04/16/2015   BUN 65* 04/16/2015   NA 141 04/16/2015   K 3.4* 04/16/2015   CL 95* 04/16/2015   CO2 34* 04/16/2015    Lab Results  Component Value Date   ALT 13* 04/06/2015   AST 27 04/06/2015   ALKPHOS 96 04/06/2015   BILITOT 1.6* 04/06/2015     Microbiology: Recent Results (from the past 240 hour(s))  MRSA PCR Screening     Status: Abnormal   Collection Time: 04/06/15 10:17 PM  Result Value Ref Range Status   MRSA by PCR POSITIVE (A) NEGATIVE Final    Comment:        The GeneXpert MRSA Assay (FDA approved for NASAL specimens only), is one component of a comprehensive MRSA colonization surveillance program. It is not intended to diagnose MRSA infection nor to guide or monitor treatment for MRSA infections. RESULT CALLED TO, READ BACK BY AND VERIFIED WITH: Tyler Aas RN [id number] 04/07/15 MITCHELL,L     Assessment/Plan: Kimberly [name] has remained afebrile with down trending white count. She states her legs feel that they are improving with diuresis and antibiotics. One of two sets of blood cultures came back positive for Pseudomonas. Xrays and MRI of feet do not show signs of  osteomyelitis from diabetic foot ulcers. She states ortho had been in last week and recommended abx therapy and compression wraps while holding off on debridement and/or amputation. Unfortunately due to her multiple co-morbidities of COPD, severe pulmonary HTN, obesity and DMII, any potential surgery should be proceeded with great caution. She is refusing vascular imaging or procedures given her hx of prolonged ventilatory support s/p anesthesia.   1. Sepsis due to Pseudomonal infection (  1/2 blood cultures) with source possibly from cellulitis: - Cont Cefepime  - Cont with wound care as directed by wound care nurse  2. Diabetic foot ulcers: - Will need boots that take pressure off of her heels when walking - States she needs a referral to go back to [facility] wound care clinic when she D/C's from hospital - Refusing ABI's at this time - MRI neg for osteomyelitis - Cont to float heels and monitor for s/s of skin breakdown  Alphonsa Overall, NP student [id number] [facility] 04/16/2015, 3:54 PM   INFECTIOUS DISEASE ATTENDING ADDENDUM:     [facility]   Date: 04/16/2015  Patient name: Kimberly [name]  Medical record number: [id number]  Date of birth: 1956/03/30    This patient has been seen and discussed with the house staff. Please see their note for complete details. I concur with their findings with the following additions/corrections:   Patients toe is stable. She does not want VVS consult for ischemia or evaluation for PVD.  Given absence of osteomyelitis will plan on her having 2 weeks of systemic therapy for her pseudomonal bacteremia and DFU ie for FOUR MORE DAYS and then DC it  I will sign off  Please call with further questions.    Acey Lav 04/16/2015, 7:53 PM

## 2015-04-16 NOTE — Progress Notes (Signed)
Inpatient Diabetes Program Recommendations  AACE/ADA: New Consensus Statement on Inpatient Glycemic Control (2013)  Target Ranges:  Prepandial:   less than 140 mg/dL      Peak postprandial:   less than 180 mg/dL (1-2 hours)      Critically ill patients:  140 - 180 mg/dL   Inpatient Diabetes Program Recommendations Insulin - Basal: ... Correction (SSI): increase to moderate scale Insulin - Meal Coverage: consider adding Novolog 4 units TID with meals for elevated postprandials Thank you  Piedad Climes BSN, RN,CDE Inpatient Diabetes Coordinator [phone] (team pager)

## 2015-04-17 DIAGNOSIS — R7881 Bacteremia: Secondary | ICD-10-CM

## 2015-04-17 LAB — GLUCOSE, CAPILLARY
GLUCOSE-CAPILLARY: 272 mg/dL — AB (ref 65–99)
Glucose-Capillary: 159 mg/dL — ABNORMAL HIGH (ref 65–99)
Glucose-Capillary: 140 mg/dL — ABNORMAL HIGH (ref 65–99)
Glucose-Capillary: 262 mg/dL — ABNORMAL HIGH (ref 65–99)
Glucose-Capillary: 238 mg/dL — ABNORMAL HIGH (ref 65–99)

## 2015-04-17 LAB — RENAL FUNCTION PANEL
ANION GAP: 14 (ref 5–15)
Albumin: 2.6 g/dL — ABNORMAL LOW (ref 3.5–5.0)
BUN: 68 mg/dL — ABNORMAL HIGH (ref 6–20)
CALCIUM: 8.7 mg/dL — AB (ref 8.9–10.3)
CHLORIDE: 96 mmol/L — AB (ref 101–111)
CO2: 28 mmol/L (ref 22–32)
Creatinine, Ser: 3.48 mg/dL — ABNORMAL HIGH (ref 0.44–1.00)
GFR calc non Af Amer: 13 mL/min — ABNORMAL LOW (ref >60)
GFR, EST AFRICAN AMERICAN: 15 mL/min — AB (ref >60)
GLUCOSE: 153 mg/dL — AB (ref 65–99)
Phosphorus: 4.4 mg/dL (ref 2.5–4.6)
Potassium: 3.7 mmol/L (ref 3.5–5.1)
Sodium: 138 mmol/L (ref 135–145)

## 2015-04-17 MED ORDER — INSULIN ASPART 100 UNIT/ML ~~LOC~~ SOLN
0.0000 [IU] | Freq: Three times a day (TID) | SUBCUTANEOUS | Status: DC
  Administered 2015-04-18: 3 [IU] via SUBCUTANEOUS
  Administered 2015-04-18: 5 [IU] via SUBCUTANEOUS

## 2015-04-17 MED ORDER — INSULIN ASPART 100 UNIT/ML ~~LOC~~ SOLN
0.0000 [IU] | Freq: Every day | SUBCUTANEOUS | Status: DC
  Administered 2015-04-17: 2 [IU] via SUBCUTANEOUS

## 2015-04-17 MED ORDER — INSULIN ASPART 100 UNIT/ML ~~LOC~~ SOLN
4.0000 [IU] | Freq: Three times a day (TID) | SUBCUTANEOUS | Status: DC
  Administered 2015-04-18 (×2): 4 [IU] via SUBCUTANEOUS

## 2015-04-17 NOTE — Progress Notes (Signed)
PROGRESS NOTE  Kimberly [name] QHK:[id number] DOB: Apr 24, 1956 DOA: 04/06/2015 PCP: [name], MD  Brief history of present illness 59 year old Caucasian female resented with bilateral lower extremity cellulitis in the setting of heel wounds. She has history of diabetes. She was also septic when she initially presented to the hospital. Started on antibiotics. Seen by orthopedics. Developed acute and chronic renal failure with diastolic CHF exacerbation. Currently on high-dose Lasix. ID has been consulted, and antibiotics were adjusted.  Assessment/Plan: Severe sepsis secondary to bilateral heel wounds/and lower extremity cellulitis and Bacteremia Patient currently afebrile. Blood pressure has improved. Urine cultures with multiple species. . Patient with clinical improvement.  - Patient could not tolerate intravenous ciprofloxacin due to ?skin reaction.  -Profore compression wraps to bilateral lower extremities.  -heel floaters.  -Patient has been seen in consultation by orthopedics who recommended IV antibiotics, compression wraps, outpatient follow-up.  -Patient will follow-up with the wound care center on discharge. -appreciate ID input-->MRI feet and ABIs -MRI bilateral feet negative for ostium myelitis or abscess -ABIs pending -Patient is not interested in further workup of her vascular disease at this time. She prefers to follow-up in the outpatient setting -CRP--3.8, ESR 110  Bacteremia--pseudomonas -continue cefepime -needs 14 days IV abx with 04/07/2015 as day #1--through 04/20/2015 -04/16/2015 echocardiogram--no source of intracardiac vegetations  Acute on chronic chronic kidney disease stage III May be secondary to ATN secondary to sepsis and hemodynamic changes  -Patient does look volume overloaded on exam..  -Baseline creatinine 1.7-2.0-->?new baseline? - Renal function slowly improving , but slowing down -Continue current furosemide dose per  nephrology-- 120 mg IV every 8 hours. -Nephrology following and appreciate input and recommendations  acute on chronic diastolic CHF exacerbation--R-sided CHF -Patient was hypotensive at the time of admission and fluid resuscitated -Patient's blood pressure responded to fluids.  -Patient then developed fluid overload.  -Patient's blood pressure has improved.  -daily weights--neg 11 pounds in past 4 days -I/Os--neg 6.6 L -Echo--EF 60-65%, grade 2 DD, PAP 91 with RV overload  Bilateral heel wounds/lower extremity cellulitis See above  History of CVA -continue ASA  Hypothyroidism Continue home dose Synthroid.  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  -CBGs have improved with patient eating better -cut back on the dose of Lantus to 40 units BID  -Increased to moderate sliding scale -add novolog 4 units with meals  Essential hypertension  Stable. Continue metoprolol succinate Norvasc has been discontinued  Morbid obesity  Prophylaxis  Heparin for DVT prophylaxis. Reduce dose due to bleeding.  Code Status: DO NOT RESUSCITATE Family Communication: Discussed with patient Disposition Plan: Wait for renal function to improve and finishing abx      Procedures/Studies: US Renal  04/12/2015   CLINICAL DATA:  Acute renal failure.  History of diabetes.  EXAM: RENAL / URINARY TRACT ULTRASOUND COMPLETE  COMPARISON:  CT of the abdomen and pelvis 06/10/2014  FINDINGS: Right Kidney:  Length: 10.1 cm. There is poor visualization of the right kidney. No hydronephrosis or mass identified.  Left Kidney:  Length: 13.2 cm. Echogenicity within normal limits. No mass or hydronephrosis visualized.  Bladder:  Appears normal for degree of bladder distention. Study is technically compromised by patient body habitus.  IMPRESSION: No hydronephrosis.   Electronically Signed   By: Nolon Nations M.D.   On: 04/12/2015 10:29   Mri Right Foot Without  Contrast  04/16/2015   CLINICAL DATA:  Diabetic patient with  bilateral heel wounds. Sepsis.  EXAM: MRI OF THE RIGHT FOREFOOT WITHOUT CONTRAST; MRI OF THE LEFT FOREFOOT WITHOUT CONTRAST  TECHNIQUE: Multiplanar, multisequence MR imaging was performed. No intravenous contrast was administered.  COMPARISON:  Plain films left foot 04/14/2015.  FINDINGS: There is no bone marrow signal abnormality to suggest osteomyelitis. Mild subcutaneous edema is seen over the dorsum of the foot. The patient's reported heel wound is not well demonstrated on this study. No focal fluid collection is identified. Intrinsic musculature the foot is atrophied. No muscle or tendon tear is identified. No notable arthropathy is seen.  IMPRESSION: Negative for abscess or osteomyelitis. Subcutaneous edema is seen over the dorsum of the foot.   Electronically Signed   By: Inge Rise M.D.   On: 04/16/2015 08:21   Mri Left Foot Without Contrast  04/16/2015   CLINICAL DATA:  Diabetic patient with bilateral heel wounds. Sepsis.  EXAM: MRI OF THE RIGHT FOREFOOT WITHOUT CONTRAST; MRI OF THE LEFT FOREFOOT WITHOUT CONTRAST  TECHNIQUE: Multiplanar, multisequence MR imaging was performed. No intravenous contrast was administered.  COMPARISON:  Plain films left foot 04/14/2015.  FINDINGS: There is no bone marrow signal abnormality to suggest osteomyelitis. Mild subcutaneous edema is seen over the dorsum of the foot. The patient's reported heel wound is not well demonstrated on this study. No focal fluid collection is identified. Intrinsic musculature the foot is atrophied. No muscle or tendon tear is identified. No notable arthropathy is seen.  IMPRESSION: Negative for abscess or osteomyelitis. Subcutaneous edema is seen over the dorsum of the foot.   Electronically Signed   By: Inge Rise M.D.   On: 04/16/2015 08:21   Dg Chest Port 1 View  04/06/2015   CLINICAL DATA:  Week and blisters on both legs. Cellulitis. Shortness of breath on  exertion.  EXAM: PORTABLE CHEST - 1 VIEW  COMPARISON:  Single view of the chest 06/24/2014.  FINDINGS: There is cardiomegaly and vascular congestion. No consolidative process, pneumothorax or effusion is identified.  IMPRESSION: Cardiomegaly and pulmonary vascular congestion.   Electronically Signed   By: Inge Rise M.D.   On: 04/06/2015 14:31   Dg Foot Complete Left  04/14/2015   CLINICAL DATA:  Diabetic foot ulcer.  EXAM: LEFT FOOT - COMPLETE 3+ VIEW  COMPARISON:  None.  FINDINGS: There is no evidence of fracture or dislocation. There is no evidence of arthropathy or other focal bone abnormality. Generalized osteopenia noted. No evidence of osteolysis or periostitis.  Small plantar and dorsal calcaneal bone spurs noted. Peripheral vascular calcification also demonstrated.  IMPRESSION: No radiographic evidence of osteomyelitis or other acute osseous abnormality.   Electronically Signed   By: Earle Gell M.D.   On: 04/14/2015 19:51   Dg Foot Complete Right  04/14/2015   CLINICAL DATA:  Diabetic foot ulcer.  EXAM: RIGHT FOOT COMPLETE - 3+ VIEW  COMPARISON:  None.  FINDINGS: There is no evidence of fracture or dislocation. There is no evidence of arthropathy or other focal bone abnormality. No evidence of periostitis or osteolysis. Generalized osteopenia noted. Small plantar and dorsal calcaneal bone spurs noted. Peripheral vascular calcification also seen. Soft tissues are unremarkable.  IMPRESSION: No radiographic evidence of osteomyelitis or other acute osseous abnormality.   Electronically Signed   By: Earle Gell M.D.   On: 04/14/2015 20:00        Subjective:   Objective: Filed Vitals:   04/16/15 1300 04/16/15 2155 04/17/15 0523 04/17/15 1411  BP: 133/34 138/50 128/46 123/48  Pulse: 81  80 79 85  Temp: 98.5 F (36.9 C) 98.6 F (37 C) 98.5 F (36.9 C) 98.3 F (36.8 C)  TempSrc: Oral Oral Oral Oral  Resp: 20 14 16 15   Height:      Weight:   122.653 kg (270 lb 6.4 oz)   SpO2: 100%  99% 100% 98%    Intake/Output Summary (Last 24 hours) at 04/17/15 1735 Last data filed at 04/17/15 1416  Gross per 24 hour  Intake    600 ml  Output    700 ml  Net   -100 ml   Weight change: -0.091 kg (-3.2 oz) Exam:   General:  Pt is alert, follows commands appropriately, not in acute distress  HEENT: No icterus, No thrush, No neck mass, [place]/AT  Cardiovascular: RRR, S1/S2, no rubs, no gallops  Respiratory: CTA bilaterally, no wheezing, no crackles, no rhonchi  Abdomen: Soft/+BS, non tender, non distended, no guarding  Extremities: No edema, No lymphangitis, No petechiae, No rashes, no synovitis  Data Reviewed: Basic Metabolic Panel:  Recent Labs Lab 04/12/15 0558 04/13/15 0533 04/14/15 0550 04/15/15 0544 04/16/15 0600 04/17/15 0442  NA 138 138 142 141 141 138  K 4.2 3.6 3.0* 3.3* 3.4* 3.7  CL 106 101 102 97* 95* 96*  CO2 24 23 29 31  34* 28  GLUCOSE 126* 98 52* 84 142* 153*  BUN 83* 84* 75* 71* 65* 68*  CREATININE 4.21* 4.39* 4.00* 3.57* 3.49* 3.48*  CALCIUM 8.6* 9.0 9.4 9.1 9.1 8.7*  MG 2.3  --   --   --   --   --   PHOS 5.9* 6.0* 4.8*  --   --  4.4   Liver Function Tests:  Recent Labs Lab 04/12/15 0558 04/13/15 0533 04/14/15 0550 04/17/15 0442  ALBUMIN 2.5* 2.8* 2.7* 2.6*   No results for input(s): LIPASE, AMYLASE in the last 168 hours. No results for input(s): AMMONIA in the last 168 hours. CBC:  Recent Labs Lab 04/11/15 0546 04/12/15 0558 04/14/15 0550 04/15/15 0544 04/16/15 0600  WBC 9.7 11.6* 12.1* 9.1 9.2  HGB 9.1* 8.9* 9.5* 9.1* 9.4*  HCT 29.1* 29.0* 29.9* 29.1* 30.4*  MCV 93.6 92.9 93.4 93.0 94.1  PLT 180 193 211 208 187   Cardiac Enzymes: No results for input(s): CKTOTAL, CKMB, CKMBINDEX, TROPONINI in the last 168 hours. BNP: Invalid input(s): POCBNP CBG:  Recent Labs Lab 04/16/15 2151 04/17/15 0423 04/17/15 0803 04/17/15 1214 04/17/15 1658  GLUCAP 261* 159* 140* 262* 272*    No results found for this or any previous  visit (from the past 240 hour(s)).   Scheduled Meds: . aspirin EC  81 mg Oral Daily  . ceFEPime (MAXIPIME) IV  2 g Intravenous Q24H  . darbepoetin (ARANESP) injection - NON-DIALYSIS  150 mcg Subcutaneous Q Tue-1800  . feeding supplement (PRO-STAT SUGAR FREE 64)  30 mL Oral BID  . furosemide  120 mg Intravenous Q8H  . heparin  5,000 Units Subcutaneous Q12H  . [START ON 04/18/2015] insulin aspart  0-15 Units Subcutaneous TID WC  . insulin aspart  0-5 Units Subcutaneous QHS  . [START ON 04/18/2015] insulin aspart  4 Units Subcutaneous TID WC  . insulin glargine  40 Units Subcutaneous BID  . levothyroxine  75 mcg Oral QAC breakfast  . metoprolol succinate  25 mg Oral Daily  . multivitamin with minerals  1 tablet Oral Daily  . pantoprazole  40 mg Oral Q0600  . potassium chloride  40 mEq Oral Daily   Continuous  Infusions:    Kanai Hilger, DO  [facility] Pager [phone]  If 7PM-7AM, please contact night-coverage [email or web address] Password [email or web address] 04/17/2015, 5:35 PM   LOS: 11 days

## 2015-04-17 NOTE — Progress Notes (Signed)
Subjective:  UOP cont to be quite good- I think missed a shift of recording- creatinine stable Objective Vital signs in last 24 hours: Filed Vitals:   04/16/15 0627 04/16/15 1300 04/16/15 2155 04/17/15 0523  BP: 113/41 133/34 138/50 128/46  Pulse: 84 81 80 79  Temp: 98.5 F (36.9 C) 98.5 F (36.9 C) 98.6 F (37 C) 98.5 F (36.9 C)  TempSrc: Oral Oral Oral Oral  Resp: 20 20 14 16   Height:      Weight:    122.653 kg (270 lb 6.4 oz)  SpO2: 100% 100% 99% 100%   Weight change: -0.091 kg (-3.2 oz)  Intake/Output Summary (Last 24 hours) at 04/17/15 1002 Last data filed at 04/17/15 0933  Gross per 24 hour  Intake    700 ml  Output   1050 ml  Net   -350 ml     Assessment/Plan: 59 year old female with multiple medical issues presenting with sepsis related to foot wound- also has developed acute on chronic renal failure this hospitalization 1.Renal- acute on chronic renal failure. Although no significant hypotension recorded I still suspect possible ATN due to initial septic episode versus just some other hemodynamic compromise due to her cardiac and vascular disease. Baseline creatinine of late seems to be just over 2.  Fortunately, it appears that her urine output is picking up and creatinine is down but rate of improvement is slowing. Continue lasix at current dose IV 2. Hypertension/volume - volume overloaded but with a reasonable blood pressure. Need to continue with IV diuretic for now. Have stopped Norvasc  3. Anemia - hemoglobin low and likely not helping. Iron stores low, repleting and have added ESA 4. Malnutrition- with decreased albumin also is not helping volume status- added protein supplement-  5. Dispo- I agree with her that instituting and maintaining chronic dialysis for her would be very difficult given her basically nonexistent mobility over the last 6 mos- bed to chair only with assist. I sincerely hope that it will not come to that for her.  Eventually will change  over to PO lasix but not yet to get ready for discharge 6. Hypokalemia- on supp daily- improving slowly      Aneudy Champlain A    Labs: Basic Metabolic Panel:  Recent Labs Lab 04/13/15 0533 04/14/15 0550 04/15/15 0544 04/16/15 0600 04/17/15 0442  NA 138 142 141 141 138  K 3.6 3.0* 3.3* 3.4* 3.7  CL 101 102 97* 95* 96*  CO2 23 29 31  34* 28  GLUCOSE 98 52* 84 142* 153*  BUN 84* 75* 71* 65* 68*  CREATININE 4.39* 4.00* 3.57* 3.49* 3.48*  CALCIUM 9.0 9.4 9.1 9.1 8.7*  PHOS 6.0* 4.8*  --   --  4.4   Liver Function Tests:  Recent Labs Lab 04/13/15 0533 04/14/15 0550 04/17/15 0442  ALBUMIN 2.8* 2.7* 2.6*   No results for input(s): LIPASE, AMYLASE in the last 168 hours. No results for input(s): AMMONIA in the last 168 hours. CBC:  Recent Labs Lab 04/11/15 0546 04/12/15 0558 04/14/15 0550 04/15/15 0544 04/16/15 0600  WBC 9.7 11.6* 12.1* 9.1 9.2  HGB 9.1* 8.9* 9.5* 9.1* 9.4*  HCT 29.1* 29.0* 29.9* 29.1* 30.4*  MCV 93.6 92.9 93.4 93.0 94.1  PLT 180 193 211 208 187   Cardiac Enzymes: No results for input(s): CKTOTAL, CKMB, CKMBINDEX, TROPONINI in the last 168 hours. CBG:  Recent Labs Lab 04/16/15 1147 04/16/15 1641 04/16/15 2151 04/17/15 0423 04/17/15 0803  GLUCAP 225* 219* 261* 159* 140*  Iron Studies:  No results for input(s): IRON, TIBC, TRANSFERRIN, FERRITIN in the last 72 hours. Studies/Results: Mri Right Foot Without Contrast  04/16/2015   CLINICAL DATA:  Diabetic patient with bilateral heel wounds. Sepsis.  EXAM: MRI OF THE RIGHT FOREFOOT WITHOUT CONTRAST; MRI OF THE LEFT FOREFOOT WITHOUT CONTRAST  TECHNIQUE: Multiplanar, multisequence MR imaging was performed. No intravenous contrast was administered.  COMPARISON:  Plain films left foot 04/14/2015.  FINDINGS: There is no bone marrow signal abnormality to suggest osteomyelitis. Mild subcutaneous edema is seen over the dorsum of the foot. The patient's reported heel wound is not well demonstrated  on this study. No focal fluid collection is identified. Intrinsic musculature the foot is atrophied. No muscle or tendon tear is identified. No notable arthropathy is seen.  IMPRESSION: Negative for abscess or osteomyelitis. Subcutaneous edema is seen over the dorsum of the foot.   Electronically Signed   By: [name] M.D.   On: 04/16/2015 08:21   Mri Left Foot Without Contrast  04/16/2015   CLINICAL DATA:  Diabetic patient with bilateral heel wounds. Sepsis.  EXAM: MRI OF THE RIGHT FOREFOOT WITHOUT CONTRAST; MRI OF THE LEFT FOREFOOT WITHOUT CONTRAST  TECHNIQUE: Multiplanar, multisequence MR imaging was performed. No intravenous contrast was administered.  COMPARISON:  Plain films left foot 04/14/2015.  FINDINGS: There is no bone marrow signal abnormality to suggest osteomyelitis. Mild subcutaneous edema is seen over the dorsum of the foot. The patient's reported heel wound is not well demonstrated on this study. No focal fluid collection is identified. Intrinsic musculature the foot is atrophied. No muscle or tendon tear is identified. No notable arthropathy is seen.  IMPRESSION: Negative for abscess or osteomyelitis. Subcutaneous edema is seen over the dorsum of the foot.   Electronically Signed   By: [name] M.D.   On: 04/16/2015 08:21   Medications: Infusions:    Scheduled Medications: . aspirin EC  81 mg Oral Daily  . ceFEPime (MAXIPIME) IV  2 g Intravenous Q24H  . darbepoetin (ARANESP) injection - NON-DIALYSIS  150 mcg Subcutaneous Q Tue-1800  . feeding supplement (PRO-STAT SUGAR FREE 64)  30 mL Oral BID  . furosemide  120 mg Intravenous Q8H  . heparin  5,000 Units Subcutaneous Q12H  . insulin aspart  0-5 Units Subcutaneous QHS  . insulin aspart  0-9 Units Subcutaneous TID WC  . insulin glargine  40 Units Subcutaneous BID  . levothyroxine  75 mcg Oral QAC breakfast  . metoprolol succinate  25 mg Oral Daily  . multivitamin with minerals  1 tablet Oral Daily  . pantoprazole   40 mg Oral Q0600  . potassium chloride  40 mEq Oral Daily    have reviewed scheduled and prn medications.  Physical Exam: General: NAD- watching olympics Heart: RRR Lungs: dec BS at bases Abdomen: obese, non tender Extremities: pitting edema    04/17/2015,10:02 AM  LOS: 11 days

## 2015-04-17 NOTE — Progress Notes (Signed)
Orthopedic Tech Progress Note Patient Details:  Kimberly [name] May 24, 1956 [id number]  Ortho Devices Type of Ortho Device: Postop shoe/boot Ortho Device/Splint Location: bilateral Ortho Device/Splint Interventions: Application   Tyray Proch 04/17/2015, 10:23 AM

## 2015-04-18 LAB — RENAL FUNCTION PANEL
ALBUMIN: 2.5 g/dL — AB (ref 3.5–5.0)
ANION GAP: 11 (ref 5–15)
BUN: 74 mg/dL — ABNORMAL HIGH (ref 6–20)
CALCIUM: 9 mg/dL (ref 8.9–10.3)
CHLORIDE: 95 mmol/L — AB (ref 101–111)
CO2: 33 mmol/L — ABNORMAL HIGH (ref 22–32)
CREATININE: 3.51 mg/dL — AB (ref 0.44–1.00)
GFR calc non Af Amer: 13 mL/min — ABNORMAL LOW (ref >60)
GFR, EST AFRICAN AMERICAN: 15 mL/min — AB (ref >60)
Glucose, Bld: 184 mg/dL — ABNORMAL HIGH (ref 65–99)
POTASSIUM: 3.5 mmol/L (ref 3.5–5.1)
Phosphorus: 4.4 mg/dL (ref 2.5–4.6)
Sodium: 139 mmol/L (ref 135–145)

## 2015-04-18 LAB — GLUCOSE, CAPILLARY
GLUCOSE-CAPILLARY: 156 mg/dL — AB (ref 65–99)
GLUCOSE-CAPILLARY: 237 mg/dL — AB (ref 65–99)
Glucose-Capillary: 223 mg/dL — ABNORMAL HIGH (ref 65–99)
Glucose-Capillary: 249 mg/dL — ABNORMAL HIGH (ref 65–99)

## 2015-04-18 MED ORDER — INSULIN ASPART 100 UNIT/ML ~~LOC~~ SOLN
0.0000 [IU] | Freq: Three times a day (TID) | SUBCUTANEOUS | Status: DC
  Administered 2015-04-18: 5 [IU] via SUBCUTANEOUS
  Administered 2015-04-19: 11 [IU] via SUBCUTANEOUS
  Administered 2015-04-19: 8 [IU] via SUBCUTANEOUS
  Administered 2015-04-19: 5 [IU] via SUBCUTANEOUS
  Administered 2015-04-20: 3 [IU] via SUBCUTANEOUS
  Administered 2015-04-20: 8 [IU] via SUBCUTANEOUS

## 2015-04-18 MED ORDER — INSULIN ASPART 100 UNIT/ML ~~LOC~~ SOLN
4.0000 [IU] | Freq: Three times a day (TID) | SUBCUTANEOUS | Status: DC
  Administered 2015-04-18 – 2015-04-19 (×4): 4 [IU] via SUBCUTANEOUS

## 2015-04-18 MED ORDER — INSULIN ASPART 100 UNIT/ML ~~LOC~~ SOLN
0.0000 [IU] | Freq: Every day | SUBCUTANEOUS | Status: DC
  Administered 2015-04-18 – 2015-04-19 (×2): 2 [IU] via SUBCUTANEOUS

## 2015-04-18 NOTE — Progress Notes (Signed)
PROGRESS NOTE  Kimberly [name] GNF:[id number] DOB: 1956/07/17 DOA: 04/06/2015 PCP: [name], MD Brief history of present illness 59 year old Caucasian female resented with bilateral lower extremity cellulitis in the setting of heel wounds. She has history of diabetes. She was also septic when she initially presented to the hospital. Started on antibiotics. Seen by orthopedics. Developed acute and chronic renal failure with diastolic CHF exacerbation. Currently on high-dose Lasix. ID has been consulted, and antibiotics were adjusted.  Assessment/Plan: Severe sepsis secondary to bilateral heel wounds/and lower extremity cellulitis and Bacteremia Patient currently afebrile. Blood pressure has improved. Urine cultures with multiple species. . Patient with clinical improvement.  - Patient could not tolerate intravenous ciprofloxacin due to ?skin reaction.  -Profore compression wraps to bilateral lower extremities--change twice a week, next change on 04/19/15 -heel floaters.  -Patient has been seen in consultation by orthopedics who recommended IV antibiotics, compression wraps, outpatient follow-up.  -Patient will follow-up with the wound care center on discharge. -appreciate ID input-->MRI feet and ABIs -MRI bilateral feet negative for ostium myelitis or abscess -ABIs pending -Patient is not interested in further workup of her vascular disease at this time. She prefers to follow-up in the outpatient setting -CRP--3.8, ESR 110  Bacteremia--pseudomonas -continue cefepime -needs 14 days IV abx with 04/07/2015 as day #1--through 04/20/2015 dose -04/16/2015 echocardiogram--no source of intracardiac vegetations  Acute on chronic chronic kidney disease stage III May be secondary to ATN secondary to sepsis and hemodynamic changes  -Patient does look volume overloaded on exam..  -Baseline creatinine 1.7-2.0-->?new baseline? - Renal function slowly improving , but slowing  down -Continue current furosemide dose per nephrology-->hold furosemide today and monitor  -Nephrology following and appreciate input and recommendations  acute on chronic diastolic CHF exacerbation--R-sided CHF -Patient was hypotensive at the time of admission and fluid resuscitated -Patient's blood pressure responded to fluids.  -Patient then developed fluid overload.  -Patient's blood pressure has improved.  -daily weights--neg 11 pounds in past 4 days -I/Os--neg 6.6 L -Echo--EF 60-65%, grade 2 DD, PAP 91 with RV overload  Bilateral heel wounds/lower extremity cellulitis See above  History of CVA -continue ASA  Hypothyroidism Continue home dose Synthroid.  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  -CBGs have improved with patient eating better -continue Lantus to 40 units BID  -Increased to moderate sliding scale -add novolog 4 units with meals  Essential hypertension  Stable. Continue metoprolol succinate Norvasc has been discontinued  Morbid obesity  Prophylaxis  Heparin for DVT prophylaxis. Reduce dose due to bleeding.  Code Status: DO NOT RESUSCITATE Family Communication: Discussed with patient Disposition Plan:  Hopefully 04/20/15 if cleared by renal       Procedures/Studies: US Renal  04/12/2015   CLINICAL DATA:  Acute renal failure.  History of diabetes.  EXAM: RENAL / URINARY TRACT ULTRASOUND COMPLETE  COMPARISON:  CT of the abdomen and pelvis 06/10/2014  FINDINGS: Right Kidney:  Length: 10.1 cm. There is poor visualization of the right kidney. No hydronephrosis or mass identified.  Left Kidney:  Length: 13.2 cm. Echogenicity within normal limits. No mass or hydronephrosis visualized.  Bladder:  Appears normal for degree of bladder distention. Study is technically compromised by patient body habitus.  IMPRESSION: No hydronephrosis.   Electronically Signed   By: Nolon Nations M.D.   On: 04/12/2015 10:29   Mri Right  Foot Without Contrast  04/16/2015   CLINICAL DATA:  Diabetic patient with bilateral  heel wounds. Sepsis.  EXAM: MRI OF THE RIGHT FOREFOOT WITHOUT CONTRAST; MRI OF THE LEFT FOREFOOT WITHOUT CONTRAST  TECHNIQUE: Multiplanar, multisequence MR imaging was performed. No intravenous contrast was administered.  COMPARISON:  Plain films left foot 04/14/2015.  FINDINGS: There is no bone marrow signal abnormality to suggest osteomyelitis. Mild subcutaneous edema is seen over the dorsum of the foot. The patient's reported heel wound is not well demonstrated on this study. No focal fluid collection is identified. Intrinsic musculature the foot is atrophied. No muscle or tendon tear is identified. No notable arthropathy is seen.  IMPRESSION: Negative for abscess or osteomyelitis. Subcutaneous edema is seen over the dorsum of the foot.   Electronically Signed   By: Inge Rise M.D.   On: 04/16/2015 08:21   Mri Left Foot Without Contrast  04/16/2015   CLINICAL DATA:  Diabetic patient with bilateral heel wounds. Sepsis.  EXAM: MRI OF THE RIGHT FOREFOOT WITHOUT CONTRAST; MRI OF THE LEFT FOREFOOT WITHOUT CONTRAST  TECHNIQUE: Multiplanar, multisequence MR imaging was performed. No intravenous contrast was administered.  COMPARISON:  Plain films left foot 04/14/2015.  FINDINGS: There is no bone marrow signal abnormality to suggest osteomyelitis. Mild subcutaneous edema is seen over the dorsum of the foot. The patient's reported heel wound is not well demonstrated on this study. No focal fluid collection is identified. Intrinsic musculature the foot is atrophied. No muscle or tendon tear is identified. No notable arthropathy is seen.  IMPRESSION: Negative for abscess or osteomyelitis. Subcutaneous edema is seen over the dorsum of the foot.   Electronically Signed   By: Inge Rise M.D.   On: 04/16/2015 08:21   Dg Chest Port 1 View  04/06/2015   CLINICAL DATA:  Week and blisters on both legs. Cellulitis. Shortness of  breath on exertion.  EXAM: PORTABLE CHEST - 1 VIEW  COMPARISON:  Single view of the chest 06/24/2014.  FINDINGS: There is cardiomegaly and vascular congestion. No consolidative process, pneumothorax or effusion is identified.  IMPRESSION: Cardiomegaly and pulmonary vascular congestion.   Electronically Signed   By: Inge Rise M.D.   On: 04/06/2015 14:31   Dg Foot Complete Left  04/14/2015   CLINICAL DATA:  Diabetic foot ulcer.  EXAM: LEFT FOOT - COMPLETE 3+ VIEW  COMPARISON:  None.  FINDINGS: There is no evidence of fracture or dislocation. There is no evidence of arthropathy or other focal bone abnormality. Generalized osteopenia noted. No evidence of osteolysis or periostitis.  Small plantar and dorsal calcaneal bone spurs noted. Peripheral vascular calcification also demonstrated.  IMPRESSION: No radiographic evidence of osteomyelitis or other acute osseous abnormality.   Electronically Signed   By: Earle Gell M.D.   On: 04/14/2015 19:51   Dg Foot Complete Right  04/14/2015   CLINICAL DATA:  Diabetic foot ulcer.  EXAM: RIGHT FOOT COMPLETE - 3+ VIEW  COMPARISON:  None.  FINDINGS: There is no evidence of fracture or dislocation. There is no evidence of arthropathy or other focal bone abnormality. No evidence of periostitis or osteolysis. Generalized osteopenia noted. Small plantar and dorsal calcaneal bone spurs noted. Peripheral vascular calcification also seen. Soft tissues are unremarkable.  IMPRESSION: No radiographic evidence of osteomyelitis or other acute osseous abnormality.   Electronically Signed   By: Earle Gell M.D.   On: 04/14/2015 20:00         Subjective: Pt continues to breath better.  Denies any F/C, CP, SOB, N/VD, abdominal pain.  No dysuria or hematuria  Objective: Filed Vitals:  04/17/15 0523 04/17/15 1411 04/17/15 2101 04/18/15 0607  BP: 128/46 123/48 123/37 116/43  Pulse: 79 85 76 76  Temp: 98.5 F (36.9 C) 98.3 F (36.8 C) 98.4 F (36.9 C) 98.5 F (36.9 C)   TempSrc: Oral Oral Oral Oral  Resp: 16 15 20 20   Height:      Weight: 122.653 kg (270 lb 6.4 oz)   122.652 kg (270 lb 6.4 oz)  SpO2: 100% 98% 100% 98%    Intake/Output Summary (Last 24 hours) at 04/18/15 1523 Last data filed at 04/18/15 1145  Gross per 24 hour  Intake    444 ml  Output    700 ml  Net   -256 ml   Weight change: -0.001 kg (-0 oz) Exam:   General:  Pt is alert, follows commands appropriately, not in acute distress  HEENT: No icterus, No thrush,, [place]/AT  Cardiovascular: RRR, S1/S2, no rubs, no gallops  Respiratory: bibasilar crackles.  No wheeze.  Abdomen: Soft/+BS, non tender, non distended, no guarding  Extremities: 1+ edema, No lymphangitis, No petechiae, No rashes, no synovitis--UNNA boots in place bilateral  Data Reviewed: Basic Metabolic Panel:  Recent Labs Lab 04/12/15 0558 04/13/15 0533 04/14/15 0550 04/15/15 0544 04/16/15 0600 04/17/15 0442 04/18/15 0600  NA 138 138 142 141 141 138 139  K 4.2 3.6 3.0* 3.3* 3.4* 3.7 3.5  CL 106 101 102 97* 95* 96* 95*  CO2 24 23 29 31  34* 28 33*  GLUCOSE 126* 98 52* 84 142* 153* 184*  BUN 83* 84* 75* 71* 65* 68* 74*  CREATININE 4.21* 4.39* 4.00* 3.57* 3.49* 3.48* 3.51*  CALCIUM 8.6* 9.0 9.4 9.1 9.1 8.7* 9.0  MG 2.3  --   --   --   --   --   --   PHOS 5.9* 6.0* 4.8*  --   --  4.4 4.4   Liver Function Tests:  Recent Labs Lab 04/12/15 0558 04/13/15 0533 04/14/15 0550 04/17/15 0442 04/18/15 0600  ALBUMIN 2.5* 2.8* 2.7* 2.6* 2.5*   No results for input(s): LIPASE, AMYLASE in the last 168 hours. No results for input(s): AMMONIA in the last 168 hours. CBC:  Recent Labs Lab 04/12/15 0558 04/14/15 0550 04/15/15 0544 04/16/15 0600  WBC 11.6* 12.1* 9.1 9.2  HGB 8.9* 9.5* 9.1* 9.4*  HCT 29.0* 29.9* 29.1* 30.4*  MCV 92.9 93.4 93.0 94.1  PLT 193 211 208 187   Cardiac Enzymes: No results for input(s): CKTOTAL, CKMB, CKMBINDEX, TROPONINI in the last 168 hours. BNP: Invalid input(s):  POCBNP CBG:  Recent Labs Lab 04/17/15 1214 04/17/15 1658 04/17/15 2058 04/18/15 0800 04/18/15 1208  GLUCAP 262* 272* 238* 156* 223*    No results found for this or any previous visit (from the past 240 hour(s)).   Scheduled Meds: . aspirin EC  81 mg Oral Daily  . ceFEPime (MAXIPIME) IV  2 g Intravenous Q24H  . darbepoetin (ARANESP) injection - NON-DIALYSIS  150 mcg Subcutaneous Q Tue-1800  . feeding supplement (PRO-STAT SUGAR FREE 64)  30 mL Oral BID  . heparin  5,000 Units Subcutaneous Q12H  . insulin aspart  0-15 Units Subcutaneous TID WC  . insulin aspart  0-5 Units Subcutaneous QHS  . insulin aspart  4 Units Subcutaneous TID WC  . insulin glargine  40 Units Subcutaneous BID  . levothyroxine  75 mcg Oral QAC breakfast  . metoprolol succinate  25 mg Oral Daily  . multivitamin with minerals  1 tablet Oral Daily  . pantoprazole  40 mg Oral Q0600  . potassium chloride  40 mEq Oral Daily   Continuous Infusions:    Cicilia Clinger, DO  [facility] Pager [phone]  If 7PM-7AM, please contact night-coverage [email or web address] Password [email or web address] 04/18/2015, 3:23 PM   LOS: 12 days

## 2015-04-18 NOTE — Progress Notes (Signed)
Subjective:  UOP cont to be quite good- I think missed a shift of recording- creatinine stable but now BUN trending up Objective Vital signs in last 24 hours: Filed Vitals:   04/17/15 0523 04/17/15 1411 04/17/15 2101 04/18/15 0607  BP: 128/46 123/48 123/37 116/43  Pulse: 79 85 76 76  Temp: 98.5 F (36.9 C) 98.3 F (36.8 C) 98.4 F (36.9 C) 98.5 F (36.9 C)  TempSrc: Oral Oral Oral Oral  Resp: Height:      Weight: 122.653 kg (270 lb 6.4 oz)   122.652 kg (270 lb 6.4 oz)  SpO2: 100% 98% 100% 98%   Weight change: -0.001 kg (-0 oz)  Intake/Output Summary (Last 24 hours) at 04/18/15 1009 Last data filed at 04/18/15 0900  Gross per 24 hour  Intake    656 ml  Output    700 ml  Net    -44 ml     Assessment/Plan: 59 year old female with multiple medical issues presenting with sepsis related to foot wound- also has developed acute on chronic renal failure this hospitalization 1.Renal- acute on chronic renal failure. Although no significant hypotension recorded I still suspect possible ATN due to initial septic episode versus just some other hemodynamic compromise due to her cardiac and vascular disease. Baseline creatinine of late seems to be just over 2.  Fortunately, it appeared that her urine output was good and creatinine down but rate of improvement is slowing. With BUN and CO2 climbing I want to hold lasix for now- do not hydrate and follow 2. Hypertension/volume - volume overloaded but with a reasonable blood pressure. After several days of iv diuresis now is looking dry- will hold lasix and follow.  Will eventually need to be resumed, likely a PO dose 3. Anemia - hemoglobin low and likely not helping. Iron stores low, repleting and have added ESA 4. Malnutrition- with decreased albumin also is not helping volume status- added protein supplement-  5. Dispo- I agree with her that instituting and maintaining chronic dialysis for her would be very difficult given her  basically nonexistent mobility over the last 6 mos- bed to chair only with assist. I sincerely hope that it will not come to that for her.  Eventually will change over to PO lasix but not yet to get ready for discharge 6. Hypokalemia- on supp daily- improving slowly   7. Foot wounds- ABX and local care right now- apparently she indicates vascualar insufficiency and not candidate for revasc- complicates all     Vedha Tercero A    Labs: Basic Metabolic Panel:  Recent Labs Lab 04/14/15 0550  04/16/15 0600 04/17/15 0442 04/18/15 0600  NA 142  < > 141 138 139  K 3.0*  < > 3.4* 3.7 3.5  CL 102  < > 95* 96* 95*  CO2 29  < > 34* 28 33*  GLUCOSE 52*  < > 142* 153* 184*  BUN 75*  < > 65* 68* 74*  CREATININE 4.00*  < > 3.49* 3.48* 3.51*  CALCIUM 9.4  < > 9.1 8.7* 9.0  PHOS 4.8*  --   --  4.4 4.4  < > = values in this interval not displayed. Liver Function Tests:  Recent Labs Lab 04/14/15 0550 04/17/15 0442 04/18/15 0600  ALBUMIN 2.7* 2.6* 2.5*   No results for input(s): LIPASE, AMYLASE in the last 168 hours. No results for input(s): AMMONIA in the last 168 hours. CBC:  Recent Labs Lab 04/12/15 0558 04/14/15 0550 04/15/15  0544 04/16/15 0600  WBC 11.6* 12.1* 9.1 9.2  HGB 8.9* 9.5* 9.1* 9.4*  HCT 29.0* 29.9* 29.1* 30.4*  MCV 92.9 93.4 93.0 94.1  PLT 193 211 208 187   Cardiac Enzymes: No results for input(s): CKTOTAL, CKMB, CKMBINDEX, TROPONINI in the last 168 hours. CBG:  Recent Labs Lab 04/17/15 0803 04/17/15 1214 04/17/15 1658 04/17/15 2058 04/18/15 0800  GLUCAP 140* 262* 272* 238* 156*    Iron Studies:  No results for input(s): IRON, TIBC, TRANSFERRIN, FERRITIN in the last 72 hours. Studies/Results: No results found. Medications: Infusions:    Scheduled Medications: . aspirin EC  81 mg Oral Daily  . ceFEPime (MAXIPIME) IV  2 g Intravenous Q24H  . darbepoetin (ARANESP) injection - NON-DIALYSIS  150 mcg Subcutaneous Q Tue-1800  . feeding  supplement (PRO-STAT SUGAR FREE 64)  30 mL Oral BID  . furosemide  120 mg Intravenous Q8H  . heparin  5,000 Units Subcutaneous Q12H  . insulin aspart  0-15 Units Subcutaneous TID WC  . insulin aspart  0-5 Units Subcutaneous QHS  . insulin aspart  4 Units Subcutaneous TID WC  . insulin glargine  40 Units Subcutaneous BID  . levothyroxine  75 mcg Oral QAC breakfast  . metoprolol succinate  25 mg Oral Daily  . multivitamin with minerals  1 tablet Oral Daily  . pantoprazole  40 mg Oral Q0600  . potassium chloride  40 mEq Oral Daily    have reviewed scheduled and prn medications.  Physical Exam: General: NAD-  Heart: RRR Lungs: dec BS at bases Abdomen: obese, non tender Extremities: pitting edema    04/18/2015,10:09 AM  LOS: 12 days

## 2015-04-19 ENCOUNTER — Ambulatory Visit (HOSPITAL_COMMUNITY): Payer: Managed Care, Other (non HMO)

## 2015-04-19 DIAGNOSIS — I739 Peripheral vascular disease, unspecified: Secondary | ICD-10-CM

## 2015-04-19 LAB — GLUCOSE, CAPILLARY
Glucose-Capillary: 236 mg/dL — ABNORMAL HIGH (ref 65–99)
Glucose-Capillary: 331 mg/dL — ABNORMAL HIGH (ref 65–99)
Glucose-Capillary: 256 mg/dL — ABNORMAL HIGH (ref 65–99)
Glucose-Capillary: 212 mg/dL — ABNORMAL HIGH (ref 65–99)

## 2015-04-19 LAB — RENAL FUNCTION PANEL
ANION GAP: 11 (ref 5–15)
Albumin: 2.5 g/dL — ABNORMAL LOW (ref 3.5–5.0)
BUN: 85 mg/dL — ABNORMAL HIGH (ref 6–20)
CALCIUM: 8.8 mg/dL — AB (ref 8.9–10.3)
CO2: 30 mmol/L (ref 22–32)
CREATININE: 3.54 mg/dL — AB (ref 0.44–1.00)
Chloride: 96 mmol/L — ABNORMAL LOW (ref 101–111)
GFR calc Af Amer: 15 mL/min — ABNORMAL LOW (ref >60)
GFR, EST NON AFRICAN AMERICAN: 13 mL/min — AB (ref >60)
Glucose, Bld: 259 mg/dL — ABNORMAL HIGH (ref 65–99)
Phosphorus: 4.1 mg/dL (ref 2.5–4.6)
Potassium: 3.7 mmol/L (ref 3.5–5.1)
SODIUM: 137 mmol/L (ref 135–145)

## 2015-04-19 MED ORDER — HYDROCERIN EX CREA: TOPICAL_CREAM | CUTANEOUS | Status: DC

## 2015-04-19 MED ORDER — INSULIN ASPART 100 UNIT/ML ~~LOC~~ SOLN
6.0000 [IU] | Freq: Three times a day (TID) | SUBCUTANEOUS | Status: DC
  Administered 2015-04-20 (×2): 6 [IU] via SUBCUTANEOUS

## 2015-04-19 MED ORDER — INSULIN GLARGINE 100 UNIT/ML ~~LOC~~ SOLN
45.0000 [IU] | Freq: Two times a day (BID) | SUBCUTANEOUS | Status: DC
  Administered 2015-04-19 – 2015-04-20 (×2): 45 [IU] via SUBCUTANEOUS
  Filled 2015-04-19 (×3): qty 0.45

## 2015-04-19 MED ORDER — HYDROCERIN EX CREA
TOPICAL_CREAM | Freq: Two times a day (BID) | CUTANEOUS | Status: DC
  Filled 2015-04-19: qty 113

## 2015-04-19 NOTE — Progress Notes (Signed)
PROGRESS NOTE  Kimberly [name] YYQ:[id number] DOB: 08/27/1956 DOA: 04/06/2015 PCP: [name], MD  Brief history of present illness 59 year old Caucasian female resented with bilateral lower extremity cellulitis in the setting of heel wounds. She has history of diabetes. She was also septic when she initially presented to the hospital. Started on antibiotics. Seen by orthopedics. Developed acute and chronic renal failure with diastolic CHF exacerbation. Currently on high-dose Lasix. ID has been consulted, and antibiotics were adjusted.  Assessment/Plan: Severe sepsis secondary to bilateral heel wounds/and lower extremity cellulitis and Bacteremia Patient currently afebrile. Blood pressure has improved. Urine cultures with multiple species. . Patient with clinical improvement.  - Patient could not tolerate intravenous ciprofloxacin due to ?skin reaction.  -Profore compression wraps to bilateral lower extremities--change twice a week, next change on 04/19/15 -heel floaters.  -Patient has been seen in consultation by orthopedics who recommended IV antibiotics, compression wraps, outpatient follow-up.  -Patient will follow-up with the wound care center on discharge. -appreciate ID input-->MRI feet and ABIs -MRI bilateral feet negative for ostium myelitis or abscess -ABIs--normal bilateral arterial flow -CRP--3.8, ESR 110  Bacteremia--pseudomonas -continue cefepime -needs 14 days IV abx with 04/07/2015 as day #1--through 04/20/2015 dose -04/16/2015 echocardiogram--no source of intracardiac vegetations  Acute on chronic chronic kidney disease stage III May be secondary to ATN secondary to sepsis and hemodynamic changes  -Patient does look volume overloaded on exam..  -Baseline creatinine 1.7-2.0-->?new baseline? ~3.5 - Renal function slowly improving , but slowing down -Continue current furosemide dose per nephrology-->hold furosemide today and monitor -Nephrology  following and appreciate input and recommendations  acute on chronic diastolic CHF exacerbation--R-sided CHF -Patient was hypotensive at the time of admission and fluid resuscitated -Patient's blood pressure responded to fluids.  -Patient then developed fluid overload.  -Patient's blood pressure has improved.  -daily weights--neg 11 pounds in past 4 days -I/Os--neg 6.6 L -Echo--EF 60-65%, grade 2 DD, PAP 91 with RV overload  Bilateral heel wounds/lower extremity cellulitis See above  History of CVA -continue ASA  Hypothyroidism Continue home dose Synthroid.  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  -CBGs have improved with patient eating better -continue Lantus to 45 units BID  -Increased to moderate sliding scale -add novolog 6 units with meals  Essential hypertension  Stable. Continue metoprolol succinate Norvasc has been discontinued  Morbid obesity  Prophylaxis  Heparin for DVT prophylaxis. Reduce dose due to bleeding.  Code Status: DO NOT RESUSCITATE Family Communication: Discussed with patient Disposition Plan: Hopefully 04/20/15 if cleared by renal           Procedures/Studies: US Renal  04/12/2015   CLINICAL DATA:  Acute renal failure.  History of diabetes.  EXAM: RENAL / URINARY TRACT ULTRASOUND COMPLETE  COMPARISON:  CT of the abdomen and pelvis 06/10/2014  FINDINGS: Right Kidney:  Length: 10.1 cm. There is poor visualization of the right kidney. No hydronephrosis or mass identified.  Left Kidney:  Length: 13.2 cm. Echogenicity within normal limits. No mass or hydronephrosis visualized.  Bladder:  Appears normal for degree of bladder distention. Study is technically compromised by patient body habitus.  IMPRESSION: No hydronephrosis.   Electronically Signed   By: Nolon Nations M.D.   On: 04/12/2015 10:29   Mri Right Foot Without Contrast  04/16/2015   CLINICAL DATA:  Diabetic patient with bilateral heel wounds.  Sepsis.  EXAM: MRI OF THE RIGHT FOREFOOT WITHOUT CONTRAST; MRI OF THE LEFT  FOREFOOT WITHOUT CONTRAST  TECHNIQUE: Multiplanar, multisequence MR imaging was performed. No intravenous contrast was administered.  COMPARISON:  Plain films left foot 04/14/2015.  FINDINGS: There is no bone marrow signal abnormality to suggest osteomyelitis. Mild subcutaneous edema is seen over the dorsum of the foot. The patient's reported heel wound is not well demonstrated on this study. No focal fluid collection is identified. Intrinsic musculature the foot is atrophied. No muscle or tendon tear is identified. No notable arthropathy is seen.  IMPRESSION: Negative for abscess or osteomyelitis. Subcutaneous edema is seen over the dorsum of the foot.   Electronically Signed   By: Inge Rise M.D.   On: 04/16/2015 08:21   Mri Left Foot Without Contrast  04/16/2015   CLINICAL DATA:  Diabetic patient with bilateral heel wounds. Sepsis.  EXAM: MRI OF THE RIGHT FOREFOOT WITHOUT CONTRAST; MRI OF THE LEFT FOREFOOT WITHOUT CONTRAST  TECHNIQUE: Multiplanar, multisequence MR imaging was performed. No intravenous contrast was administered.  COMPARISON:  Plain films left foot 04/14/2015.  FINDINGS: There is no bone marrow signal abnormality to suggest osteomyelitis. Mild subcutaneous edema is seen over the dorsum of the foot. The patient's reported heel wound is not well demonstrated on this study. No focal fluid collection is identified. Intrinsic musculature the foot is atrophied. No muscle or tendon tear is identified. No notable arthropathy is seen.  IMPRESSION: Negative for abscess or osteomyelitis. Subcutaneous edema is seen over the dorsum of the foot.   Electronically Signed   By: Inge Rise M.D.   On: 04/16/2015 08:21   Dg Chest Port 1 View  04/06/2015   CLINICAL DATA:  Week and blisters on both legs. Cellulitis. Shortness of breath on exertion.  EXAM: PORTABLE CHEST - 1 VIEW  COMPARISON:  Single view of the chest 06/24/2014.   FINDINGS: There is cardiomegaly and vascular congestion. No consolidative process, pneumothorax or effusion is identified.  IMPRESSION: Cardiomegaly and pulmonary vascular congestion.   Electronically Signed   By: Inge Rise M.D.   On: 04/06/2015 14:31   Dg Foot Complete Left  04/14/2015   CLINICAL DATA:  Diabetic foot ulcer.  EXAM: LEFT FOOT - COMPLETE 3+ VIEW  COMPARISON:  None.  FINDINGS: There is no evidence of fracture or dislocation. There is no evidence of arthropathy or other focal bone abnormality. Generalized osteopenia noted. No evidence of osteolysis or periostitis.  Small plantar and dorsal calcaneal bone spurs noted. Peripheral vascular calcification also demonstrated.  IMPRESSION: No radiographic evidence of osteomyelitis or other acute osseous abnormality.   Electronically Signed   By: Earle Gell M.D.   On: 04/14/2015 19:51   Dg Foot Complete Right  04/14/2015   CLINICAL DATA:  Diabetic foot ulcer.  EXAM: RIGHT FOOT COMPLETE - 3+ VIEW  COMPARISON:  None.  FINDINGS: There is no evidence of fracture or dislocation. There is no evidence of arthropathy or other focal bone abnormality. No evidence of periostitis or osteolysis. Generalized osteopenia noted. Small plantar and dorsal calcaneal bone spurs noted. Peripheral vascular calcification also seen. Soft tissues are unremarkable.  IMPRESSION: No radiographic evidence of osteomyelitis or other acute osseous abnormality.   Electronically Signed   By: Earle Gell M.D.   On: 04/14/2015 20:00         Subjective: Patient denies fevers, chills, headache, chest pain, dyspnea, nausea, vomiting, diarrhea, abdominal pain, dysuria, hematuria   Objective: Filed Vitals:   04/18/15 2118 04/19/15 0451 04/19/15 1020 04/19/15 1358  BP: 118/41 150/56 125/43 130/44  Pulse: 79 85  78 79  Temp:  98 F (36.7 C)  98.4 F (36.9 C)  TempSrc:  Oral  Oral  Resp:  18  20  Height:      Weight:  122.662 kg (270 lb 6.7 oz)    SpO2:  94%  98%     Intake/Output Summary (Last 24 hours) at 04/19/15 1830 Last data filed at 04/19/15 1300  Gross per 24 hour  Intake    360 ml  Output    400 ml  Net    -40 ml   Weight change: 0.01 kg (0.4 oz) Exam:   General:  Pt is alert, follows commands appropriately, not in acute distress  HEENT: No icterus, No thrush, No neck mass, [place]/AT  Cardiovascular: RRR, S1/S2, no rubs, no gallops  Respiratory: CTA bilaterally, no wheezing, no crackles, no rhonchi  Abdomen: Soft/+BS, non tender, non distended, no guarding  Extremities: No edema, No lymphangitis, No petechiae, No rashes, no synovitis  Data Reviewed: Basic Metabolic Panel:  Recent Labs Lab 04/13/15 0533 04/14/15 0550 04/15/15 0544 04/16/15 0600 04/17/15 0442 04/18/15 0600 04/19/15 0530  NA 138 142 141 141 138 139 137  K 3.6 3.0* 3.3* 3.4* 3.7 3.5 3.7  CL 101 102 97* 95* 96* 95* 96*  CO2 _0 34* 28 33* 30  GLUCOSE 98 52* 84 142* 153* 184* 259*  BUN 84* 75* 71* 65* 68* 74* 85*  CREATININE 4.39* 4.00* 3.57* 3.49* 3.48* 3.51* 3.54*  CALCIUM 9.0 9.4 9.1 9.1 8.7* 9.0 8.8*  PHOS 6.0* 4.8*  --   --  4.4 4.4 4.1   Liver Function Tests:  Recent Labs Lab 04/13/15 0533 04/14/15 0550 04/17/15 0442 04/18/15 0600 04/19/15 0530  ALBUMIN 2.8* 2.7* 2.6* 2.5* 2.5*   No results for input(s): LIPASE, AMYLASE in the last 168 hours. No results for input(s): AMMONIA in the last 168 hours. CBC:  Recent Labs Lab 04/14/15 0550 04/15/15 0544 04/16/15 0600  WBC 12.1* 9.1 9.2  HGB 9.5* 9.1* 9.4*  HCT 29.9* 29.1* 30.4*  MCV 93.4 93.0 94.1  PLT 211 208 187   Cardiac Enzymes: No results for input(s): CKTOTAL, CKMB, CKMBINDEX, TROPONINI in the last 168 hours. BNP: Invalid input(s): POCBNP CBG:  Recent Labs Lab 04/18/15 1721 04/18/15 2117 04/19/15 0755 04/19/15 1141 04/19/15 1656  GLUCAP 249* 237* 236* 331* 256*    No results found for this or any previous visit (from the past 240 hour(s)).   Scheduled Meds: .  aspirin EC  81 mg Oral Daily  . ceFEPime (MAXIPIME) IV  2 g Intravenous Q24H  . darbepoetin (ARANESP) injection - NON-DIALYSIS  150 mcg Subcutaneous Q Tue-1800  . feeding supplement (PRO-STAT SUGAR FREE 64)  30 mL Oral BID  . heparin  5,000 Units Subcutaneous Q12H  . hydrocerin   Topical Once per day on Mon Thu  . insulin aspart  0-15 Units Subcutaneous TID WC  . insulin aspart  0-5 Units Subcutaneous QHS  . insulin aspart  4 Units Subcutaneous TID WC  . insulin glargine  40 Units Subcutaneous BID  . levothyroxine  75 mcg Oral QAC breakfast  . metoprolol succinate  25 mg Oral Daily  . multivitamin with minerals  1 tablet Oral Daily  . pantoprazole  40 mg Oral Q0600  . potassium chloride  40 mEq Oral Daily   Continuous Infusions:    Princella Jaskiewicz, DO  [facility] Pager [phone]  If 7PM-7AM, please contact night-coverage [email or web address] Password [email or web address] 04/19/2015, 6:30 PM  LOS: 13 days

## 2015-04-19 NOTE — Progress Notes (Addendum)
VASCULAR LAB PRELIMINARY  ARTERIAL  ABI completed:    RIGHT    LEFT    PRESSURE WAVEFORM  PRESSURE WAVEFORM  BRACHIAL 160 Triphasic BRACHIAL 158 Triphasic  DP 160 Monophasic DP 137 Monophasic  PT 130 Monophasic PT 156 Monophasic    RIGHT LEFT  ABI 1.0 0.97   ABIs indicate normal arteriala flow bilaterally at rest. However monophasic Doppler signals may suggest a false elevation of pressures which would adversely effect the results.  Ekansh Sherk, RVS 04/19/2015, 2:52 PM

## 2015-04-19 NOTE — Care Management Note (Signed)
Case Management Note  Patient Details  Name: JIYA KISSINGER MRN: [id number] Date of Birth: 12/05/1955  Subjective/Objective:      Last dose ABX 04/20/2015. Creatinine 3.54. Nephrology continues to hold Lasix.  WOC has suggested [facility] for Compression Wraps Changes 2x/week on Mon and Thursday and Prescription at discharge for Compression Stockings with 20-30 mm HG Compression (to be purchased at Medical supply store of choice).             Action/Plan:Will follow for final disposition. [facility] following.    Expected Discharge Date:                  Expected Discharge Plan:  Home w Home Health Services  In-House Referral:     Discharge planning Services  CM Consult  Post Acute Care Choice:  NA Choice offered to:  Patient  DME Arranged:    DME Agency:     HH Arranged:  RN HH Agency:  [facility]  Status of Service:  In process, will continue to follow  Medicare Important Message Given:    Date Medicare IM Given:    Medicare IM give by:    Date Additional Medicare IM Given:    Additional Medicare Important Message give by:     If discussed at Long Length of Stay Meetings, dates discussed:    Additional Comments:  Yvone Neu, RN 04/19/2015, 3:49 PM

## 2015-04-19 NOTE — Progress Notes (Signed)
S: No new CO O:BP 125/43 mmHg  Pulse 78  Temp(Src) 98 F (36.7 C) (Oral)  Resp 18  Ht  (1.626 m)  Wt 122.662 kg (270 lb 6.7 oz)  BMI 46.39 kg/m2  SpO2 94%  Intake/Output Summary (Last 24 hours) at 04/19/15 1145 Last data filed at 04/19/15 1045  Gross per 24 hour  Intake     50 ml  Output    400 ml  Net   -350 ml   Weight change: 0.01 kg (0.4 oz) WJX:BJYNW and alert CVS:RRR Resp:Clear Abd:+ BS NTND  + abd wall edema Ext: Wrapped in Ace.  + edema in thighs NEURO:CNI Ox3 no asterixis   . aspirin EC  81 mg Oral Daily  . ceFEPime (MAXIPIME) IV  2 g Intravenous Q24H  . darbepoetin (ARANESP) injection - NON-DIALYSIS  150 mcg Subcutaneous Q Tue-1800  . feeding supplement (PRO-STAT SUGAR FREE 64)  30 mL Oral BID  . heparin  5,000 Units Subcutaneous Q12H  . insulin aspart  0-15 Units Subcutaneous TID WC  . insulin aspart  0-5 Units Subcutaneous QHS  . insulin aspart  4 Units Subcutaneous TID WC  . insulin glargine  40 Units Subcutaneous BID  . levothyroxine  75 mcg Oral QAC breakfast  . metoprolol succinate  25 mg Oral Daily  . multivitamin with minerals  1 tablet Oral Daily  . pantoprazole  40 mg Oral Q0600  . potassium chloride  40 mEq Oral Daily   No results found. BMET    Component Value Date/Time   NA 137 04/19/2015 0530   K 3.7 04/19/2015 0530   CL 96* 04/19/2015 0530   CO2 30 04/19/2015 0530   GLUCOSE 259* 04/19/2015 0530   BUN 85* 04/19/2015 0530   CREATININE 3.54* 04/19/2015 0530   CALCIUM 8.8* 04/19/2015 0530   GFRNONAA 13* 04/19/2015 0530   GFRAA 15* 04/19/2015 0530   CBC    Component Value Date/Time   WBC 9.2 04/16/2015 0600   RBC 3.23* 04/16/2015 0600   RBC 3.12* 05/10/2010 1512   HGB 9.4* 04/16/2015 0600   HCT 30.4* 04/16/2015 0600   PLT 187 04/16/2015 0600   MCV 94.1 04/16/2015 0600   MCH 29.1 04/16/2015 0600   MCHC 30.9 04/16/2015 0600   RDW 16.8* 04/16/2015 0600   LYMPHSABS 1.1 04/10/2015 0520   MONOABS 0.4 04/10/2015 0520   EOSABS  0.3 04/10/2015 0520   BASOSABS 0.1 04/10/2015 0520     Assessment:  1. Acute on CKD, renal fx stable, UO marginal though pt admits it is not accurate 2. Pseudomonal bacteremia 3. DM 4. HTN 5. Anemia on aranesp   Plan: 1. Will hold lasix 1 more day and plan to resume tomorrow 2. Daily Scr.     Iraida Cragin T

## 2015-04-19 NOTE — Consult Note (Signed)
WOC to change 4 layer compression wraps bilaterally.  Patient with history of venous stasis disease and has been treated in the past with compression and long term compression stockings. However her current stockings do not fit her any longer, they are too large.  Bedside nurse removed Profore wraps for ABI's to be completed, they are normal. ABI Right 1.0/Left 0.97 normal  Venous stasis dermatitis- will add Eucerin with next Profore change.  Will be due to have Profore 4-layer compression changed again on Thursday.   Will need HHRN for compression wrap changes 2 wk M/Th and will need script at DC for compression stockings with 20-85mmHG compression (to be fit at medical supply store of the patient's choice).  Discussed POC with patient and bedside nurse.  Re consult if needed, will not follow at this time. Thanks  Leasa Kincannon [employer], CWOCN [phone])

## 2015-04-19 NOTE — Progress Notes (Signed)
Physical Therapy Treatment Patient Details Name: Kimberly [name] MRN: [id number] DOB: [birth year] Today's Date: 04/19/2015    History of Present Illness 59 year old female with diastolic heart failure, COPD, and vascular disease came to the [facility] emergency department on 04/06/2015 complaining of right heel pain. She has been followed by a local wound care clinic for several years. However, 2 days prior to admission she started to have increasing right heel pain and redness in the leg. This was associated with fatigue, malaise, and a low-grade fever. She also noted to ED staff increasing shortness of breath over the last several days. Presently diagnosed with severe sepsis secondary to bilateral heel wounds/and lower extremity cellulitis and urinary tract infection.    PT Comments    Pt continues to be concerned about if she is supposed to be WB on her feet.  Fracture boots ordered over the weekend.  PT called MD, Dr. Lajoyce Corners and left message re: ok for gait vs transfers and limited WB.  Pt is doing much better with bed mobility, however, just a few days in the bed has made her significantly more deconditioned in standing.  PT will continue to follow acutely.   Follow Up Recommendations  Home health PT     Equipment Recommendations  None recommended by PT    Recommendations for Other Services   NA     Precautions / Restrictions Precautions Precautions: Fall Precaution Comments: pt reported loss of sensation in B LE and that her balance is severely decreased due to complications of previous stroke, she is also very fearful of falling.    Mobility  Bed Mobility Overal bed mobility: Modified Independent Bed Mobility: Rolling;Sidelying to Sit Rolling: Modified independent (Device/Increase time) Sidelying to sit: Modified independent (Device/Increase time);HOB elevated       General bed mobility comments: Pt using bed rail and HOB elevated, momentum and extra time needed to complete task,  however, pt can do it without external assist.   Transfers Overall transfer level: Needs assistance Equipment used: Rolling walker (2 wheeled) Transfers: Sit to/from [employer] Sit to Stand: Min guard Stand pivot transfers: Min guard       General transfer comment: Min guard assist for safety as pt has limited standing tolerance due to LE weakness.  Palpably shaky in standing.   Ambulation/Gait         Gait velocity: deferred due to unsure of if physician (ortho) wants her doing a lot of walking until wounds heal.  She does now have bil fracture shoes which were donned for OOB to chair transfer.               Balance Overall balance assessment: Needs assistance Sitting-balance support: Feet supported;No upper extremity supported Sitting balance-Leahy Scale: Good     Standing balance support: Bilateral upper extremity supported Standing balance-Leahy Scale: Poor                      Cognition Arousal/Alertness: Awake/alert Behavior During Therapy: WFL for tasks assessed/performed Overall Cognitive Status: Within Functional Limits for tasks assessed                      Exercises General Exercises - Upper Extremity Shoulder Flexion: AROM;Both;10 reps;Seated Elbow Flexion: AROM;Both;10 reps;Seated General Exercises - Lower Extremity Long Arc Quad: AROM;Both;15 reps;Seated Hip ABduction/ADduction: AROM;Both;15 reps;Seated Hip Flexion/Marching: AROM;Both;15 reps;Seated Toe Raises: AROM;Both;Seated;20 reps        Pertinent Vitals/Pain Pain Assessment: No/denies pain  PT Goals (current goals can now be found in the care plan section) Acute Rehab PT Goals Patient Stated Goal: to get back to being a short community ambulator.  Progress towards PT goals: Not progressing toward goals - comment (due to ambiguous WB status)    Frequency  Min 3X/week    PT Plan Current plan remains appropriate       End of Session  Equipment Utilized During Treatment: Oxygen Activity Tolerance: No increased pain;Patient limited by fatigue Patient left: in chair;with call bell/phone within reach     Time: 1610-9604 PT Time Calculation (min) (ACUTE ONLY): 21 min  Charges:  $Therapeutic Activity: 8-22 mins                      Eathen Budreau B. Shaelyn Decarli, PT, DPT [phone]   04/19/2015, 4:42 PM

## 2015-04-19 NOTE — Progress Notes (Signed)
Inpatient Diabetes Program Recommendations  AACE/ADA: New Consensus Statement on Inpatient Glycemic Control (2013)  Target Ranges:  Prepandial:   less than 140 mg/dL      Peak postprandial:   less than 180 mg/dL (1-2 hours)      Critically ill patients:  140 - 180 mg/dL   Inpatient Diabetes Program Recommendations Insulin - Basal: Fasting glucose levels overall controlled. Lantus at 40 units bid effective Correction (SSI): xxxxxxxxxxx Insulin - Meal Coverage: Appears tro need increase in meal coverage to 6 units tidwc. Pt takes 6 units meal coverage at home as well.  Thank you Lenor Coffin, RN, MSN, CDE  Diabetes Inpatient Program Office: [phone] Pager: [phone] 8:00 am to 5:00 pm

## 2015-04-20 LAB — GLUCOSE, CAPILLARY
GLUCOSE-CAPILLARY: 237 mg/dL — AB (ref 65–99)
GLUCOSE-CAPILLARY: 273 mg/dL — AB (ref 65–99)
Glucose-Capillary: 196 mg/dL — ABNORMAL HIGH (ref 65–99)

## 2015-04-20 LAB — RENAL FUNCTION PANEL
Albumin: 2.6 g/dL — ABNORMAL LOW (ref 3.5–5.0)
Anion gap: 11 (ref 5–15)
BUN: 80 mg/dL — AB (ref 6–20)
CALCIUM: 9.1 mg/dL (ref 8.9–10.3)
CHLORIDE: 95 mmol/L — AB (ref 101–111)
CO2: 30 mmol/L (ref 22–32)
CREATININE: 3.55 mg/dL — AB (ref 0.44–1.00)
GFR calc Af Amer: 15 mL/min — ABNORMAL LOW (ref >60)
GFR calc non Af Amer: 13 mL/min — ABNORMAL LOW (ref >60)
GLUCOSE: 199 mg/dL — AB (ref 65–99)
PHOSPHORUS: 4.1 mg/dL (ref 2.5–4.6)
Potassium: 3.8 mmol/L (ref 3.5–5.1)
SODIUM: 136 mmol/L (ref 135–145)

## 2015-04-20 MED ORDER — TORSEMIDE 20 MG PO TABS: 40.0000 mg | ORAL_TABLET | Freq: Two times a day (BID) | ORAL | Status: DC

## 2015-04-20 MED ORDER — METOPROLOL SUCCINATE ER 50 MG PO TB24: 50.0000 mg | ORAL_TABLET | Freq: Every day | ORAL | Status: DC

## 2015-04-20 MED ORDER — METOPROLOL SUCCINATE ER 50 MG PO TB24
50.0000 mg | ORAL_TABLET | Freq: Every day | ORAL | Status: DC
  Administered 2015-04-20: 50 mg via ORAL
  Filled 2015-04-20: qty 1

## 2015-04-20 NOTE — Progress Notes (Signed)
[name] to be D/C'd Home per MD order.  Discussed with the patient and all questions fully answered.  VSS, Skin clean, dry and intact without evidence of skin break down, no evidence of skin tears noted. IV catheter discontinued intact. Site without signs and symptoms of complications. Dressing and pressure applied.  An After Visit Summary was printed and given to the patient. Patient received prescription.  D/c education completed with patient/family including follow up instructions, medication list, d/c activities limitations if indicated, with other d/c instructions as indicated by MD - patient able to verbalize understanding, all questions fully answered.   Patient instructed to return to ED, call 911, or call MD for any changes in condition.   Patient escorted via WC, and D/C home via private auto.  L'ESPERANCE, Neil Brickell C 04/20/2015 2:25 PM

## 2015-04-20 NOTE — Progress Notes (Signed)
Patient brought personal wheelchair to hospital and does not have it now because she states it got lost when she came from ED. Charge RN and Markus Jarvis with leadership made aware and to call patient at home if found. Pt agreeable.

## 2015-04-20 NOTE — Progress Notes (Signed)
04/20/2015   I spoke with a representative from Dr. Audrie Lia office today who reports that pt can be WBAT on both feet in post-op shoes.  She is allowed to walk.  Pt informed.  Thanks, Rollene Rotunda. Che Below, PT, DPT [phone]

## 2015-04-20 NOTE — Care Management Note (Signed)
Case Management Note  Patient Details  Name: Kimberly [name] MRN: [id number] Date of Birth: Mar 10, 1956  Subjective/Objective:   Patient for dc today, NCM notified Corrie Dandy with [facility], PT, OT.                   Action/Plan:   Expected Discharge Date:                  Expected Discharge Plan:  Home w Home Health Services  In-House Referral:     Discharge planning Services  CM Consult  Post Acute Care Choice:  NA Choice offered to:  Patient  DME Arranged:    DME Agency:     HH Arranged:  RN, PT, OT HH Agency:  [facility]  Status of Service:  Completed, signed off  Medicare Important Message Given:    Date Medicare IM Given:    Medicare IM give by:    Date Additional Medicare IM Given:    Additional Medicare Important Message give by:     If discussed at Long Length of Stay Meetings, dates discussed:    Additional Comments:  Leone Haven, RN 04/20/2015, 12:08 PM

## 2015-04-20 NOTE — Discharge Summary (Signed)
Physician Discharge Summary  Kimberly [name] ATF:[id number] DOB: [birth year] DOA: 04/06/2015  PCP: [name], MD  Admit date: 04/06/2015 Discharge date: 04/20/2015  Recommendations for Outpatient Follow-up:  1. Pt will need to follow up with PCP in 2 weeks post discharge 2. Please obtain BMP amd CBC in one week 3. Follow-up Dr. Vanetta Mulders 05/04/2015--115PM Discharge Diagnoses:   Severe sepsis secondary to bilateral heel wounds/and lower extremity cellulitis and Bacteremia Patient currently afebrile. Blood pressure has improved. Urine cultures with multiple species. . Patient with clinical improvement.  - Patient could not tolerate intravenous ciprofloxacin due to ?skin reaction.  -Profore compression wraps to bilateral lower extremities--change twice a week, last changed on 04/19/15 -heel floaters.  -Patient has been seen in consultation by orthopedics who recommended IV antibiotics, compression wraps, outpatient follow-up.  -Patient will follow-up with the wound care center on discharge. -appreciate ID input-->MRI feet and ABIs -MRI bilateral feet negative for ostium myelitis or abscess -ABIs--normal bilateral arterial flow -CRP--3.8, ESR 110 -The patient will follow up at the [facility] care center after discharge for her wound care dressings. -Her lower extremity wounds will continue to improve throughout the hospitalization  Bacteremia--pseudomonas -continue cefepime -needs 14 days IV abx with 04/07/2015 as day #1--through 04/20/2015 dose -04/16/2015 echocardiogram--no source of intracardiac vegetations -Patient finished 14 days of intravenous antibiotics during hospitalization Acute on chronic chronic kidney disease stage III May be secondary to ATN secondary to sepsis and hemodynamic changes  -Patient does look volume overloaded on exam..  -Baseline creatinine 1.7-2.0-->?new baseline? ~3.5 - Renal function slowly improving , but slowing down -Continue current  furosemide dose per nephrology-->hold furosemide 8/8 and monitor -Nephrology following and appreciate input and recommendations -The patient's renal function remained stable. There is question whether patient likely has a new renal baseline. -Serum creatinine 3.55 on the day of discharge -Renal function has remained stable for 72 hours prior to discharge -Case was discussed with nephrology, Dr. Mercy Moore, recommended discharge home with torsemide 40 mg twice a day -Patient has an appointment with nephrology 05/04/2015 1:15 PM acute on chronic diastolic CHF exacerbation--R-sided CHF -Patient was hypotensive at the time of admission and fluid resuscitated -Patient's blood pressure responded to fluids.  -Patient then developed fluid overload.  -Patient's blood pressure has improved.  -daily weights--neg 13 pounds in past 4 days -I/Os--neg 6.6 L -Echo--EF 60-65%, grade 2 DD, PAP 91 with RV overload  Chronic respiratory failure -The patient remains clinically stable on 2 L nasal cannula which is her normal home dose of oxygen -The patient will be discharged with 2 L nasal cannula  Bilateral heel wounds/lower extremity cellulitis See above  History of CVA -continue ASA  Hypothyroidism Continue home dose Synthroid.  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  -CBGs have improved with patient eating better -continue Lantus to 45 units BID  -Increased to moderate sliding scale -add novolog 6 units with meals  Essential hypertension  Stable. Continue metoprolol succinate Norvasc has been discontinued -The patient's metoprolol succinate has been adjusted to 50 mg daily  Morbid obesity  Prophylaxis  Heparin for DVT prophylaxis. Reduce dose due to bleeding. Discharge Condition: stable  Disposition: home Follow-up Information    Follow up with Newt Minion, MD.   Specialty:  Orthopedic Surgery   Why:  As needed   Contact information:    [place] [address] [phone]       Follow up with [facility].   Why:  [place] RN for  dressing changes. Will call in next 24-48 hours to set up first home health visit.   Contact information:   [address] [place] [address] [phone]       Follow up with [facility]              On 04/29/2015.   Why:  Thursday, August 18th, 2016 @ 10:15am.   Contact information:   [address] [address] [phone]      Follow up with [name], MD In 1 week.   Specialty:  Internal Medicine   Contact information:   [address] [place] [address] [phone]       Follow up with Louis Meckel, MD.   Specialty:  Nephrology   Why:  05/04/2015 1:15 PM   Contact information:   [place] [address] [phone]       Diet:carb modified Wt Readings from Last 3 Encounters:  04/20/15 121.655 kg (268 lb 3.2 oz)  09/18/14 119.069 kg (262 lb 8 oz)  09/01/14 121.337 kg (267 lb 8 oz)    History of present illness:  59 year old Caucasian female resented with bilateral lower extremity cellulitis in the setting of heel wounds. She has history of diabetes. She was also septic when she initially presented to the hospital. Started on antibiotics. Seen by orthopedics. Developed acute and chronic renal failure with diastolic CHF exacerbation. Currently on high-dose Lasix. ID has been consulted, and antibiotics were adjusted. The patient was started on intravenous furosemide 120 mg IV every 8 hours. Nephrology continued to follow the patient and make recommendations regarding her diuresis. Ultimately, the patient improved clinically. Her serum creatinine plateaued and remained stable. The patient remained afebrile and hemodynamically stable. She finished 14 days of antibiotics for bacteremia. The patient was discharged in stable condition.  She will follow up with the wound care center for her leg wounds. Home health physical therapy and occupational therapy were set up.  Consultants: ID Ortho  Discharge Exam: Filed Vitals:   04/20/15 0944  BP: 113/43  Pulse: 80  Temp:   Resp: 15   Filed Vitals:   04/19/15 2136 04/20/15 0622 04/20/15 0627 04/20/15 0944  BP: 138/51 140/107 141/56 113/43  Pulse: 80 80 81 80  Temp: 98.5 F (36.9 C) 98.4 F (36.9 C)    TempSrc: Oral Oral    Resp: _0 Height:      Weight:  121.655 kg (268 lb 3.2 oz)    SpO2: 95% 97% 98% 100%   General: A&O x 3, NAD, pleasant, cooperative Cardiovascular: RRR, no rub, no gallop, no S3 Respiratory: Bibasilar crackles. No wheeze. Abdomen:soft, nontender, nondistended, positive bowel sounds Extremities: trace LE edema, No lymphangitis, no petechiae  Discharge Instructions      Discharge Instructions    Diet - low sodium heart healthy    Complete by:  As directed      Increase activity slowly    Complete by:  As directed             Medication List    STOP taking these medications        amLODipine 5 MG tablet  Commonly known as:  NORVASC     metolazone 5 MG tablet  Commonly known as:  ZAROXOLYN      TAKE these medications        acetaminophen 325 MG tablet  Commonly known  as:  TYLENOL  Take 650 mg by mouth every 6 (six) hours as needed for mild pain.     albuterol 108 (90 BASE) MCG/ACT inhaler  Commonly known as:  PROVENTIL HFA;VENTOLIN HFA  Inhale 1-2 puffs into the lungs every 6 (six) hours as needed for wheezing or shortness of breath.     aspirin EC 81 MG tablet  Take 81 mg by mouth daily.     insulin aspart 100 UNIT/ML injection  Commonly known as:  novoLOG  Inject 6 Units into the skin 3 (three) times daily with meals.     insulin glargine 100 UNIT/ML injection  Commonly known as:  LANTUS  Inject 60 Units into the skin 2 (two) times daily.     levothyroxine 75 MCG tablet  Commonly known as:  SYNTHROID,  LEVOTHROID  Take 75 mcg by mouth daily before breakfast.     metoprolol succinate 50 MG 24 hr tablet  Commonly known as:  TOPROL-XL  Take 1 tablet (50 mg total) by mouth daily. Take 1 and 1/2 tabs daily = 75 mg daily     MULTIVITAMIN PO  Take 1 tablet by mouth daily.     potassium chloride SA 20 MEQ tablet  Commonly known as:  KLOR-CON M20  Take 2 tablets (40 mEq total) by mouth 2 (two) times daily.     torsemide 20 MG tablet  Commonly known as:  DEMADEX  Take 2 tablets (40 mg total) by mouth 2 (two) times daily.         The results of significant diagnostics from this hospitalization (including imaging, microbiology, ancillary and laboratory) are listed below for reference.    Significant Diagnostic Studies: US Renal  04/12/2015   CLINICAL DATA:  Acute renal failure.  History of diabetes.  EXAM: RENAL / URINARY TRACT ULTRASOUND COMPLETE  COMPARISON:  CT of the abdomen and pelvis 06/10/2014  FINDINGS: Right Kidney:  Length: 10.1 cm. There is poor visualization of the right kidney. No hydronephrosis or mass identified.  Left Kidney:  Length: 13.2 cm. Echogenicity within normal limits. No mass or hydronephrosis visualized.  Bladder:  Appears normal for degree of bladder distention. Study is technically compromised by patient body habitus.  IMPRESSION: No hydronephrosis.   Electronically Signed   By: Nolon Nations M.D.   On: 04/12/2015 10:29   Mri Right Foot Without Contrast  04/16/2015   CLINICAL DATA:  Diabetic patient with bilateral heel wounds. Sepsis.  EXAM: MRI OF THE RIGHT FOREFOOT WITHOUT CONTRAST; MRI OF THE LEFT FOREFOOT WITHOUT CONTRAST  TECHNIQUE: Multiplanar, multisequence MR imaging was performed. No intravenous contrast was administered.  COMPARISON:  Plain films left foot 04/14/2015.  FINDINGS: There is no bone marrow signal abnormality to suggest osteomyelitis. Mild subcutaneous edema is seen over the dorsum of the foot. The patient's reported heel wound is not well  demonstrated on this study. No focal fluid collection is identified. Intrinsic musculature the foot is atrophied. No muscle or tendon tear is identified. No notable arthropathy is seen.  IMPRESSION: Negative for abscess or osteomyelitis. Subcutaneous edema is seen over the dorsum of the foot.   Electronically Signed   By: Inge Rise M.D.   On: 04/16/2015 08:21   Mri Left Foot Without Contrast  04/16/2015   CLINICAL DATA:  Diabetic patient with bilateral heel wounds. Sepsis.  EXAM: MRI OF THE RIGHT FOREFOOT WITHOUT CONTRAST; MRI OF THE LEFT FOREFOOT WITHOUT CONTRAST  TECHNIQUE: Multiplanar, multisequence MR imaging was performed. No intravenous contrast was administered.  COMPARISON:  Plain films left foot 04/14/2015.  FINDINGS: There is no bone marrow signal abnormality to suggest osteomyelitis. Mild subcutaneous edema is seen over the dorsum of the foot. The patient's reported heel wound is not well demonstrated on this study. No focal fluid collection is identified. Intrinsic musculature the foot is atrophied. No muscle or tendon tear is identified. No notable arthropathy is seen.  IMPRESSION: Negative for abscess or osteomyelitis. Subcutaneous edema is seen over the dorsum of the foot.   Electronically Signed   By: Inge Rise M.D.   On: 04/16/2015 08:21   Dg Chest Port 1 View  04/06/2015   CLINICAL DATA:  Week and blisters on both legs. Cellulitis. Shortness of breath on exertion.  EXAM: PORTABLE CHEST - 1 VIEW  COMPARISON:  Single view of the chest 06/24/2014.  FINDINGS: There is cardiomegaly and vascular congestion. No consolidative process, pneumothorax or effusion is identified.  IMPRESSION: Cardiomegaly and pulmonary vascular congestion.   Electronically Signed   By: Inge Rise M.D.   On: 04/06/2015 14:31   Dg Foot Complete Left  04/14/2015   CLINICAL DATA:  Diabetic foot ulcer.  EXAM: LEFT FOOT - COMPLETE 3+ VIEW  COMPARISON:  None.  FINDINGS: There is no evidence of fracture or  dislocation. There is no evidence of arthropathy or other focal bone abnormality. Generalized osteopenia noted. No evidence of osteolysis or periostitis.  Small plantar and dorsal calcaneal bone spurs noted. Peripheral vascular calcification also demonstrated.  IMPRESSION: No radiographic evidence of osteomyelitis or other acute osseous abnormality.   Electronically Signed   By: Earle Gell M.D.   On: 04/14/2015 19:51   Dg Foot Complete Right  04/14/2015   CLINICAL DATA:  Diabetic foot ulcer.  EXAM: RIGHT FOOT COMPLETE - 3+ VIEW  COMPARISON:  None.  FINDINGS: There is no evidence of fracture or dislocation. There is no evidence of arthropathy or other focal bone abnormality. No evidence of periostitis or osteolysis. Generalized osteopenia noted. Small plantar and dorsal calcaneal bone spurs noted. Peripheral vascular calcification also seen. Soft tissues are unremarkable.  IMPRESSION: No radiographic evidence of osteomyelitis or other acute osseous abnormality.   Electronically Signed   By: Earle Gell M.D.   On: 04/14/2015 20:00     Microbiology: No results found for this or any previous visit (from the past 240 hour(s)).   Labs: Basic Metabolic Panel:  Recent Labs Lab 04/14/15 0550  04/16/15 0600 04/17/15 0442 04/18/15 0600 04/19/15 0530 04/20/15 0732  NA 142  < > 141 138 139 137 136  K 3.0*  < > 3.4* 3.7 3.5 3.7 3.8  CL 102  < > 95* 96* 95* 96* 95*  CO2 29  < > 34* 28 33* 30 30  GLUCOSE 52*  < > 142* 153* 184* 259* 199*  BUN 75*  < > 65* 68* 74* 85* 80*  CREATININE 4.00*  < > 3.49* 3.48* 3.51* 3.54* 3.55*  CALCIUM 9.4  < > 9.1 8.7* 9.0 8.8* 9.1  PHOS 4.8*  --   --  4.4 4.4 4.1 4.1  < > = values in this interval not displayed. Liver Function Tests:  Recent Labs Lab 04/14/15 0550 04/17/15 0442 04/18/15 0600 04/19/15 0530 04/20/15 0732  ALBUMIN 2.7* 2.6* 2.5* 2.5* 2.6*   No results for input(s): LIPASE, AMYLASE in the last 168 hours. No results for input(s): AMMONIA in the  last 168 hours. CBC:  Recent Labs Lab 04/14/15 0550 04/15/15 0544 04/16/15 0600  WBC 12.1* 9.1  9.2  HGB 9.5* 9.1* 9.4*  HCT 29.9* 29.1* 30.4*  MCV 93.4 93.0 94.1  PLT 211 208 187   Cardiac Enzymes: No results for input(s): CKTOTAL, CKMB, CKMBINDEX, TROPONINI in the last 168 hours. BNP: Invalid input(s): POCBNP CBG:  Recent Labs Lab 04/19/15 1141 04/19/15 1656 04/19/15 2133 04/20/15 0002 04/20/15 0809  GLUCAP 331* 256* 212* 237* 196*    Time coordinating discharge:  Greater than 30 minutes  Signed:  Tovah Slavick, DO [facility] Pager: [phone] 04/20/2015, 11:37 AM

## 2015-04-20 NOTE — Progress Notes (Signed)
S: No new CO O:BP 113/43 mmHg  Pulse 80  Temp(Src) 98.4 F (36.9 C) (Oral)  Resp 15  Ht  (1.626 m)  Wt 121.655 kg (268 lb 3.2 oz)  BMI 46.01 kg/m2  SpO2 100%  Intake/Output Summary (Last 24 hours) at 04/20/15 1018 Last data filed at 04/19/15 2200  Gross per 24 hour  Intake    230 ml  Output    400 ml  Net   -170 ml   Weight change: -1.007 kg (-2 lb 3.5 oz) ZOX:WRUEA and alert CVS:RRR Resp:Clear Abd:+ BS NTND  + abd wall edema Ext: Wrapped in Ace.  + edema in thighs NEURO:CNI Ox3 no asterixis   . aspirin EC  81 mg Oral Daily  . ceFEPime (MAXIPIME) IV  2 g Intravenous Q24H  . darbepoetin (ARANESP) injection - NON-DIALYSIS  150 mcg Subcutaneous Q Tue-1800  . feeding supplement (PRO-STAT SUGAR FREE 64)  30 mL Oral BID  . heparin  5,000 Units Subcutaneous Q12H  . hydrocerin   Topical Once per day on Mon Thu  . insulin aspart  0-15 Units Subcutaneous TID WC  . insulin aspart  0-5 Units Subcutaneous QHS  . insulin aspart  6 Units Subcutaneous TID WC  . insulin glargine  45 Units Subcutaneous BID  . levothyroxine  75 mcg Oral QAC breakfast  . metoprolol succinate  50 mg Oral Daily  . multivitamin with minerals  1 tablet Oral Daily  . pantoprazole  40 mg Oral Q0600  . potassium chloride  40 mEq Oral Daily   No results found. BMET    Component Value Date/Time   NA 136 04/20/2015 0732   K 3.8 04/20/2015 0732   CL 95* 04/20/2015 0732   CO2 30 04/20/2015 0732   GLUCOSE 199* 04/20/2015 0732   BUN 80* 04/20/2015 0732   CREATININE 3.55* 04/20/2015 0732   CALCIUM 9.1 04/20/2015 0732   GFRNONAA 13* 04/20/2015 0732   GFRAA 15* 04/20/2015 0732   CBC    Component Value Date/Time   WBC 9.2 04/16/2015 0600   RBC 3.23* 04/16/2015 0600   RBC 3.12* 05/10/2010 1512   HGB 9.4* 04/16/2015 0600   HCT 30.4* 04/16/2015 0600   PLT 187 04/16/2015 0600   MCV 94.1 04/16/2015 0600   MCH 29.1 04/16/2015 0600   MCHC 30.9 04/16/2015 0600   RDW 16.8* 04/16/2015 0600   LYMPHSABS 1.1  04/10/2015 0520   MONOABS 0.4 04/10/2015 0520   EOSABS 0.3 04/10/2015 0520   BASOSABS 0.1 04/10/2015 0520     Assessment:  1. Acute on CKD (baseline Scr upper 2's), renal fx stable, UO remains marginal though she again says it is not accurate 2. Pseudomonal bacteremia 3. DM 4. HTN 5. Anemia on aranesp   Plan: 1. Will resume her diuretic, she was on torsemide  BID as outpt but will start at  BID for now 2. Daily Scr.     Kamdon Reisig T

## 2015-04-29 ENCOUNTER — Encounter (HOSPITAL_BASED_OUTPATIENT_CLINIC_OR_DEPARTMENT_OTHER): Payer: Managed Care, Other (non HMO) | Attending: Internal Medicine

## 2015-04-29 DIAGNOSIS — I10 Essential (primary) hypertension: Secondary | ICD-10-CM | POA: Diagnosis not present

## 2015-04-29 DIAGNOSIS — M1990 Unspecified osteoarthritis, unspecified site: Secondary | ICD-10-CM | POA: Diagnosis not present

## 2015-04-29 DIAGNOSIS — I509 Heart failure, unspecified: Secondary | ICD-10-CM | POA: Diagnosis not present

## 2015-04-29 DIAGNOSIS — I2510 Atherosclerotic heart disease of native coronary artery without angina pectoris: Secondary | ICD-10-CM | POA: Insufficient documentation

## 2015-04-29 DIAGNOSIS — J449 Chronic obstructive pulmonary disease, unspecified: Secondary | ICD-10-CM | POA: Diagnosis not present

## 2015-04-29 DIAGNOSIS — D649 Anemia, unspecified: Secondary | ICD-10-CM | POA: Diagnosis not present

## 2015-04-29 DIAGNOSIS — N184 Chronic kidney disease, stage 4 (severe): Secondary | ICD-10-CM | POA: Diagnosis not present

## 2015-04-29 DIAGNOSIS — E1140 Type 2 diabetes mellitus with diabetic neuropathy, unspecified: Secondary | ICD-10-CM | POA: Insufficient documentation

## 2015-04-29 DIAGNOSIS — E11621 Type 2 diabetes mellitus with foot ulcer: Secondary | ICD-10-CM | POA: Insufficient documentation

## 2015-04-29 DIAGNOSIS — L97411 Non-pressure chronic ulcer of right heel and midfoot limited to breakdown of skin: Secondary | ICD-10-CM | POA: Insufficient documentation

## 2015-04-29 DIAGNOSIS — Z794 Long term (current) use of insulin: Secondary | ICD-10-CM | POA: Insufficient documentation

## 2015-05-03 ENCOUNTER — Telehealth: Payer: Self-pay

## 2015-05-03 NOTE — Telephone Encounter (Signed)
Pt. Is requesting a refill for Torsemide 20 mg tablet. With SIG take 3 tablets by mouth twice a day (take extra 20 mg if weight increase of 3 lbs in one day). When she was in the hospital it changed to take 2 tab two times a day on 04-20-15.  Not sure of which one to refill for.

## 2015-05-03 NOTE — Telephone Encounter (Signed)
Please call and discuss with the patient

## 2015-05-04 NOTE — Telephone Encounter (Signed)
I called the patient to clarify how she was taking her Torsemide and she stated she had an appointment today with [facility] and Dr Kathrene Bongo refilled it for her and she is going to take the medication how prescribed by Dr. Kathrene Bongo.

## 2015-05-13 ENCOUNTER — Encounter (HOSPITAL_BASED_OUTPATIENT_CLINIC_OR_DEPARTMENT_OTHER): Payer: Managed Care, Other (non HMO) | Attending: Internal Medicine

## 2015-05-13 DIAGNOSIS — Z872 Personal history of diseases of the skin and subcutaneous tissue: Secondary | ICD-10-CM | POA: Insufficient documentation

## 2015-05-13 DIAGNOSIS — I872 Venous insufficiency (chronic) (peripheral): Secondary | ICD-10-CM | POA: Diagnosis not present

## 2015-06-17 ENCOUNTER — Other Ambulatory Visit: Payer: Self-pay

## 2015-06-17 DIAGNOSIS — Z01810 Encounter for preprocedural cardiovascular examination: Secondary | ICD-10-CM

## 2015-06-17 DIAGNOSIS — N189 Chronic kidney disease, unspecified: Secondary | ICD-10-CM

## 2015-06-18 ENCOUNTER — Other Ambulatory Visit (HOSPITAL_COMMUNITY): Payer: Self-pay | Admitting: *Deleted

## 2015-06-21 ENCOUNTER — Other Ambulatory Visit: Payer: Self-pay

## 2015-06-21 ENCOUNTER — Encounter (HOSPITAL_COMMUNITY)
Admission: RE | Admit: 2015-06-21 | Discharge: 2015-06-21 | Disposition: A | Discharge status: Home / Self Care | Payer: Managed Care, Other (non HMO) | Source: Ambulatory Visit | Attending: Nephrology | Admitting: Nephrology

## 2015-06-21 DIAGNOSIS — D509 Iron deficiency anemia, unspecified: Secondary | ICD-10-CM | POA: Diagnosis present

## 2015-06-21 DIAGNOSIS — N189 Chronic kidney disease, unspecified: Secondary | ICD-10-CM

## 2015-06-21 DIAGNOSIS — Z01810 Encounter for preprocedural cardiovascular examination: Secondary | ICD-10-CM

## 2015-06-21 MED ORDER — FERUMOXYTOL INJECTION 510 MG/17 ML
510.0000 mg | INTRAVENOUS | Status: DC
  Administered 2015-06-21: 510 mg via INTRAVENOUS
  Filled 2015-06-21: qty 17

## 2015-06-23 ENCOUNTER — Encounter: Payer: Self-pay | Admitting: Vascular Surgery

## 2015-06-23 ENCOUNTER — Encounter (HOSPITAL_COMMUNITY): Payer: Self-pay | Admitting: *Deleted

## 2015-06-23 ENCOUNTER — Other Ambulatory Visit: Payer: Self-pay

## 2015-06-23 MED ORDER — CEFUROXIME SODIUM 1.5 G IJ SOLR
1.5000 g | INTRAMUSCULAR | Status: AC
  Administered 2015-06-24: 1.5 g via INTRAVENOUS
  Filled 2015-06-23: qty 1.5

## 2015-06-23 MED ORDER — CHLORHEXIDINE GLUCONATE CLOTH 2 % EX PADS: 6.0000 | MEDICATED_PAD | Freq: Once | CUTANEOUS | Status: DC

## 2015-06-23 MED ORDER — SODIUM CHLORIDE 0.9 % IV SOLN
INTRAVENOUS | Status: DC
  Administered 2015-06-24: 08:00:00 via INTRAVENOUS

## 2015-06-23 NOTE — Progress Notes (Signed)
Pt has extensive cardiac history and Dr. Gala RomneyBensimhon follows her in the CHF clinic. Pt states she has chest pain off and on all the time, will go away with rest. Hx of a stroke in 2011 with right sided weakness and difficulty swallowing. Pt is unable to take medications without applesauce, pt will not be taking any medications in the AM tomorrow before surgery. Pt also reports that she has a "sinus thing" going on. States she does not have a fever, but feels congested and is passing blood clots from her nose. She states that this has been going on for several months with the bleeding from her nose.

## 2015-06-24 ENCOUNTER — Ambulatory Visit (HOSPITAL_COMMUNITY): Payer: Managed Care, Other (non HMO) | Admitting: Certified Registered Nurse Anesthetist

## 2015-06-24 ENCOUNTER — Ambulatory Visit (HOSPITAL_COMMUNITY): Payer: Managed Care, Other (non HMO)

## 2015-06-24 ENCOUNTER — Encounter (HOSPITAL_COMMUNITY)
Admission: RE | Disposition: A | Discharge status: Home / Self Care | Payer: Self-pay | Source: Ambulatory Visit | Attending: Vascular Surgery

## 2015-06-24 ENCOUNTER — Encounter (HOSPITAL_COMMUNITY): Payer: Self-pay | Admitting: *Deleted

## 2015-06-24 ENCOUNTER — Ambulatory Visit (HOSPITAL_COMMUNITY)
Admission: RE | Admit: 2015-06-24 | Discharge: 2015-06-24 | Disposition: A | Discharge status: Home / Self Care | Payer: Managed Care, Other (non HMO) | Source: Ambulatory Visit | Attending: Vascular Surgery | Admitting: Vascular Surgery

## 2015-06-24 DIAGNOSIS — Z794 Long term (current) use of insulin: Secondary | ICD-10-CM | POA: Insufficient documentation

## 2015-06-24 DIAGNOSIS — Z87891 Personal history of nicotine dependence: Secondary | ICD-10-CM | POA: Diagnosis not present

## 2015-06-24 DIAGNOSIS — I739 Peripheral vascular disease, unspecified: Secondary | ICD-10-CM | POA: Diagnosis not present

## 2015-06-24 DIAGNOSIS — I1310 Hypertensive heart and chronic kidney disease without heart failure, with stage 1 through stage 4 chronic kidney disease, or unspecified chronic kidney disease: Secondary | ICD-10-CM | POA: Diagnosis not present

## 2015-06-24 DIAGNOSIS — Z992 Dependence on renal dialysis: Secondary | ICD-10-CM | POA: Diagnosis not present

## 2015-06-24 DIAGNOSIS — K219 Gastro-esophageal reflux disease without esophagitis: Secondary | ICD-10-CM | POA: Insufficient documentation

## 2015-06-24 DIAGNOSIS — E113599 Type 2 diabetes mellitus with proliferative diabetic retinopathy without macular edema, unspecified eye: Secondary | ICD-10-CM | POA: Diagnosis not present

## 2015-06-24 DIAGNOSIS — J939 Pneumothorax, unspecified: Secondary | ICD-10-CM

## 2015-06-24 DIAGNOSIS — E039 Hypothyroidism, unspecified: Secondary | ICD-10-CM | POA: Insufficient documentation

## 2015-06-24 DIAGNOSIS — Z7982 Long term (current) use of aspirin: Secondary | ICD-10-CM | POA: Diagnosis not present

## 2015-06-24 DIAGNOSIS — I69391 Dysphagia following cerebral infarction: Secondary | ICD-10-CM | POA: Diagnosis not present

## 2015-06-24 DIAGNOSIS — J42 Unspecified chronic bronchitis: Secondary | ICD-10-CM | POA: Diagnosis not present

## 2015-06-24 DIAGNOSIS — Z419 Encounter for procedure for purposes other than remedying health state, unspecified: Secondary | ICD-10-CM

## 2015-06-24 DIAGNOSIS — G4730 Sleep apnea, unspecified: Secondary | ICD-10-CM | POA: Insufficient documentation

## 2015-06-24 DIAGNOSIS — Z79899 Other long term (current) drug therapy: Secondary | ICD-10-CM | POA: Diagnosis not present

## 2015-06-24 DIAGNOSIS — Z8249 Family history of ischemic heart disease and other diseases of the circulatory system: Secondary | ICD-10-CM | POA: Diagnosis not present

## 2015-06-24 DIAGNOSIS — N183 Chronic kidney disease, stage 3 (moderate): Secondary | ICD-10-CM | POA: Insufficient documentation

## 2015-06-24 DIAGNOSIS — Z9981 Dependence on supplemental oxygen: Secondary | ICD-10-CM | POA: Insufficient documentation

## 2015-06-24 DIAGNOSIS — E1142 Type 2 diabetes mellitus with diabetic polyneuropathy: Secondary | ICD-10-CM | POA: Insufficient documentation

## 2015-06-24 DIAGNOSIS — I2510 Atherosclerotic heart disease of native coronary artery without angina pectoris: Secondary | ICD-10-CM | POA: Diagnosis not present

## 2015-06-24 DIAGNOSIS — E1122 Type 2 diabetes mellitus with diabetic chronic kidney disease: Secondary | ICD-10-CM | POA: Diagnosis not present

## 2015-06-24 DIAGNOSIS — I69351 Hemiplegia and hemiparesis following cerebral infarction affecting right dominant side: Secondary | ICD-10-CM | POA: Diagnosis not present

## 2015-06-24 DIAGNOSIS — I509 Heart failure, unspecified: Secondary | ICD-10-CM | POA: Diagnosis not present

## 2015-06-24 DIAGNOSIS — E785 Hyperlipidemia, unspecified: Secondary | ICD-10-CM | POA: Diagnosis not present

## 2015-06-24 DIAGNOSIS — J45909 Unspecified asthma, uncomplicated: Secondary | ICD-10-CM | POA: Insufficient documentation

## 2015-06-24 DIAGNOSIS — M1990 Unspecified osteoarthritis, unspecified site: Secondary | ICD-10-CM | POA: Diagnosis not present

## 2015-06-24 HISTORY — DX: Anemia, unspecified: D64.9

## 2015-06-24 HISTORY — PX: INSERTION OF DIALYSIS CATHETER: SHX1324

## 2015-06-24 LAB — POCT I-STAT 4, (NA,K, GLUC, HGB,HCT)
Glucose, Bld: 225 mg/dL — ABNORMAL HIGH (ref 65–99)
HCT: 34 % — ABNORMAL LOW (ref 36.0–46.0)
Hemoglobin: 11.6 g/dL — ABNORMAL LOW (ref 12.0–15.0)
Potassium: 3.8 mmol/L (ref 3.5–5.1)
Sodium: 138 mmol/L (ref 135–145)

## 2015-06-24 LAB — GLUCOSE, CAPILLARY
GLUCOSE-CAPILLARY: 190 mg/dL — AB (ref 65–99)
GLUCOSE-CAPILLARY: 188 mg/dL — AB (ref 65–99)
GLUCOSE-CAPILLARY: 179 mg/dL — AB (ref 65–99)

## 2015-06-24 LAB — SURGICAL PCR SCREEN
MRSA, PCR: POSITIVE — AB
Staphylococcus aureus: POSITIVE — AB

## 2015-06-24 SURGERY — INSERTION OF DIALYSIS CATHETER
Anesthesia: Monitor Anesthesia Care | Site: Neck | Laterality: Right

## 2015-06-24 MED ORDER — MIDAZOLAM HCL 2 MG/2ML IJ SOLN: 0.5000 mg | Freq: Once | INTRAMUSCULAR | Status: DC | PRN

## 2015-06-24 MED ORDER — FENTANYL CITRATE (PF) 100 MCG/2ML IJ SOLN
INTRAMUSCULAR | Status: DC | PRN
  Administered 2015-06-24 (×2): 50 ug via INTRAVENOUS

## 2015-06-24 MED ORDER — LIDOCAINE HCL (PF) 1 % IJ SOLN
INTRAMUSCULAR | Status: DC | PRN
Start: 2015-06-24 — End: 2015-06-24
  Administered 2015-06-24: 20 mL

## 2015-06-24 MED ORDER — HYDROMORPHONE HCL 1 MG/ML IJ SOLN: 0.2500 mg | INTRAMUSCULAR | Status: DC | PRN

## 2015-06-24 MED ORDER — FENTANYL CITRATE (PF) 250 MCG/5ML IJ SOLN
INTRAMUSCULAR | Status: AC
  Filled 2015-06-24: qty 5

## 2015-06-24 MED ORDER — LIDOCAINE HCL (CARDIAC) 20 MG/ML IV SOLN
INTRAVENOUS | Status: DC | PRN
  Administered 2015-06-24: 60 mg via INTRAVENOUS

## 2015-06-24 MED ORDER — HEPARIN SODIUM (PORCINE) 1000 UNIT/ML IJ SOLN
INTRAMUSCULAR | Status: DC | PRN
  Administered 2015-06-24: 4.6 mL

## 2015-06-24 MED ORDER — HEPARIN SODIUM (PORCINE) 1000 UNIT/ML IJ SOLN
INTRAMUSCULAR | Status: AC
  Filled 2015-06-24: qty 1

## 2015-06-24 MED ORDER — PHENYLEPHRINE HCL 10 MG/ML IJ SOLN
INTRAMUSCULAR | Status: DC | PRN
  Administered 2015-06-24: 40 ug via INTRAVENOUS

## 2015-06-24 MED ORDER — PROMETHAZINE HCL 25 MG/ML IJ SOLN: 6.2500 mg | INTRAMUSCULAR | Status: DC | PRN

## 2015-06-24 MED ORDER — OXYCODONE-ACETAMINOPHEN 5-325 MG PO TABS: 1.0000 | ORAL_TABLET | Freq: Four times a day (QID) | ORAL | Status: DC | PRN

## 2015-06-24 MED ORDER — PROPOFOL 500 MG/50ML IV EMUL
INTRAVENOUS | Status: DC | PRN
  Administered 2015-06-24: 50 ug/kg/min via INTRAVENOUS

## 2015-06-24 MED ORDER — 0.9 % SODIUM CHLORIDE (POUR BTL) OPTIME
TOPICAL | Status: DC | PRN
  Administered 2015-06-24: 1000 mL

## 2015-06-24 MED ORDER — LIDOCAINE HCL (PF) 1 % IJ SOLN
INTRAMUSCULAR | Status: AC
  Filled 2015-06-24: qty 30

## 2015-06-24 MED ORDER — SODIUM CHLORIDE 0.9 % IV SOLN
INTRAVENOUS | Status: DC | PRN
  Administered 2015-06-24: 500 mL

## 2015-06-24 MED ORDER — MIDAZOLAM HCL 2 MG/2ML IJ SOLN
INTRAMUSCULAR | Status: AC
  Filled 2015-06-24: qty 4

## 2015-06-24 MED ORDER — MEPERIDINE HCL 25 MG/ML IJ SOLN: 6.2500 mg | INTRAMUSCULAR | Status: DC | PRN

## 2015-06-24 MED ORDER — MUPIROCIN 2 % EX OINT
1.0000 "application " | TOPICAL_OINTMENT | Freq: Once | CUTANEOUS | Status: AC
  Administered 2015-06-24: 1 via TOPICAL
  Filled 2015-06-24: qty 22

## 2015-06-24 MED ORDER — ONDANSETRON HCL 4 MG/2ML IJ SOLN
INTRAMUSCULAR | Status: DC | PRN
  Administered 2015-06-24: 4 mg via INTRAVENOUS

## 2015-06-24 MED ORDER — PROPOFOL 10 MG/ML IV BOLUS
INTRAVENOUS | Status: AC
  Filled 2015-06-24: qty 20

## 2015-06-24 MED ORDER — IOHEXOL 300 MG/ML  SOLN
INTRAMUSCULAR | Status: DC | PRN
  Administered 2015-06-24: 10 mL via INTRAVENOUS

## 2015-06-24 SURGICAL SUPPLY — 47 items
BAG DECANTER FOR FLEXI CONT (MISCELLANEOUS) ×4 IMPLANT
BIOPATCH RED 1 DISK 7.0 (GAUZE/BANDAGES/DRESSINGS) ×3 IMPLANT
BIOPATCH RED 1IN DISK 7.0MM (GAUZE/BANDAGES/DRESSINGS) ×1
CATH CANNON HEMO 15F 50CM (CATHETERS) IMPLANT
CATH CANNON HEMO 15FR 19 (HEMODIALYSIS SUPPLIES) IMPLANT
CATH CANNON HEMO 15FR 23CM (HEMODIALYSIS SUPPLIES) ×4 IMPLANT
CATH CANNON HEMO 15FR 31CM (HEMODIALYSIS SUPPLIES) IMPLANT
CATH CANNON HEMO 15FR 32CM (HEMODIALYSIS SUPPLIES) IMPLANT
COVER PROBE W GEL 5X96 (DRAPES) IMPLANT
COVER SURGICAL LIGHT HANDLE (MISCELLANEOUS) ×4 IMPLANT
DECANTER SPIKE VIAL GLASS SM (MISCELLANEOUS) ×4 IMPLANT
DRAPE C-ARM 42X72 X-RAY (DRAPES) ×4 IMPLANT
DRAPE CHEST BREAST 15X10 FENES (DRAPES) ×4 IMPLANT
GAUZE SPONGE 2X2 8PLY STRL LF (GAUZE/BANDAGES/DRESSINGS) ×2 IMPLANT
GAUZE SPONGE 4X4 16PLY XRAY LF (GAUZE/BANDAGES/DRESSINGS) ×4 IMPLANT
GLOVE BIOGEL PI IND STRL 6.5 (GLOVE) ×2 IMPLANT
GLOVE BIOGEL PI INDICATOR 6.5 (GLOVE) ×2
GLOVE SS BIOGEL STRL SZ 7 (GLOVE) ×2 IMPLANT
GLOVE SUPERSENSE BIOGEL SZ 7 (GLOVE) ×2
GLOVE SURG SS PI 7.0 STRL IVOR (GLOVE) ×4 IMPLANT
GOWN STRL REUS W/ TWL LRG LVL3 (GOWN DISPOSABLE) ×4 IMPLANT
GOWN STRL REUS W/ TWL XL LVL3 (GOWN DISPOSABLE) ×2 IMPLANT
GOWN STRL REUS W/TWL LRG LVL3 (GOWN DISPOSABLE) ×4
GOWN STRL REUS W/TWL XL LVL3 (GOWN DISPOSABLE) ×2
GUIDEWIRE ANGLED .035X150CM (WIRE) ×4 IMPLANT
KIT BASIN OR (CUSTOM PROCEDURE TRAY) ×4 IMPLANT
KIT ROOM TURNOVER OR (KITS) ×4 IMPLANT
LIQUID BAND (GAUZE/BANDAGES/DRESSINGS) ×4 IMPLANT
NEEDLE 18GX1X1/2 (RX/OR ONLY) (NEEDLE) ×4 IMPLANT
NEEDLE 22X1 1/2 (OR ONLY) (NEEDLE) ×4 IMPLANT
NEEDLE HYPO 25GX1X1/2 BEV (NEEDLE) ×4 IMPLANT
NS IRRIG 1000ML POUR BTL (IV SOLUTION) ×4 IMPLANT
PACK SURGICAL SETUP 50X90 (CUSTOM PROCEDURE TRAY) ×4 IMPLANT
PAD ARMBOARD 7.5X6 YLW CONV (MISCELLANEOUS) ×8 IMPLANT
SOAP 2 % CHG 4 OZ (WOUND CARE) ×4 IMPLANT
SPONGE GAUZE 2X2 STER 10/PKG (GAUZE/BANDAGES/DRESSINGS) ×2
SPONGE LAP 18X18 X RAY DECT (DISPOSABLE) ×4 IMPLANT
SUT ETHILON 3 0 PS 1 (SUTURE) ×4 IMPLANT
SUT VIC AB 4-0 PS2 27 (SUTURE) ×4 IMPLANT
SUT VICRYL 4-0 PS2 18IN ABS (SUTURE) ×4 IMPLANT
SYR 20CC LL (SYRINGE) ×4 IMPLANT
SYR 5ML LL (SYRINGE) ×8 IMPLANT
SYR CONTROL 10ML LL (SYRINGE) ×4 IMPLANT
SYRINGE 10CC LL (SYRINGE) ×8 IMPLANT
TAPE CLOTH SURG 4X10 WHT LF (GAUZE/BANDAGES/DRESSINGS) ×4 IMPLANT
WATER STERILE IRR 1000ML POUR (IV SOLUTION) ×4 IMPLANT
WIRE BENTSON .035X145CM (WIRE) ×4 IMPLANT

## 2015-06-24 NOTE — Transfer of Care (Signed)
Immediate Anesthesia Transfer of Care Note  Patient: Kimberly ManilaSusan K [name]  Procedure(s) Performed: Procedure(s): ULTRASOUND BILATERAL INTERNAL JUGULAR VEIN INSERTION OF DIALYSIS CATHETER LEFT INTERNAL JUGULAR VEIN  (N/A) INTRAOPERATIVE CENTRAL VENOGRAM RIGHT INTERNAL JUGULAR VEIN (Right)  Patient Location: PACU  Anesthesia Type:MAC  Level of Consciousness: awake, alert  and oriented  Airway & Oxygen Therapy: Patient Spontanous Breathing and Patient connected to face mask oxygen  Post-op Assessment: Report given to RN and Post -op Vital signs reviewed and stable  Post vital signs: Reviewed and stable  Last Vitals:  Filed Vitals:   06/24/15 0740  BP: 179/66  Pulse: 85  Temp: 36.6 C  Resp: 18    Complications: No apparent anesthesia complications

## 2015-06-24 NOTE — Anesthesia Postprocedure Evaluation (Signed)
  Anesthesia Post-op Note  Patient: Kimberly [name]  Procedure(s) Performed: Procedure(s): ULTRASOUND BILATERAL INTERNAL JUGULAR VEIN INSERTION OF DIALYSIS CATHETER LEFT INTERNAL JUGULAR VEIN  (N/A) INTRAOPERATIVE CENTRAL VENOGRAM RIGHT INTERNAL JUGULAR VEIN (Right)  Patient Location: PACU  Anesthesia Type:MAC  Level of Consciousness: awake, alert , oriented and patient cooperative  Airway and Oxygen Therapy: Patient Spontanous Breathing and Patient connected to nasal cannula oxygen  Post-op Pain: none  Post-op Assessment: Post-op Vital signs reviewed, Patient's Cardiovascular Status Stable, Respiratory Function Stable, Patent Airway, No signs of Nausea or vomiting and Pain level controlled              Post-op Vital Signs: Reviewed and stable  Last Vitals:  Filed Vitals:   06/24/15 1245  BP: 141/85  Pulse: 80  Temp:   Resp: 16    Complications: No apparent anesthesia complications

## 2015-06-24 NOTE — Op Note (Signed)
OPERATIVE REPORT  Date of Surgery: 06/24/2015  Surgeon: Josephina GipJames Lawson, MD  Assistant: Nurse  Pre-op Diagnosis: Stage III Chronic Kidney Disease N18.3  Post-op Diagnosis: Stage III Chronic Kidney Disease N18.3  Procedure: Procedure(s): ULTRASOUND BILATERAL INTERNAL JUGULAR VEIN INSERTION OF DIALYSIS CATHETER LEFT INTERNAL JUGULAR VEIN -23 cm-Diateck INTRAOPERATIVE CENTRAL VENOGRAM RIGHT INTERNAL JUGULAR VEIN  Anesthesia: General  EBL: 50 cc  Complications: None  Procedure Details:  The patient was taken to the operating room placed in the Trendelenburg position upper chest and neck were exposed. Both internal jugular veins were imaged using B-mode ultrasound with the sono site. The left IJ appeared larger than the right but the right was patent. After prepping and draping in routine sterile manner the right IJ was entered using a supraclavicular approach guidewire would not pass centrally on multiple attempts. A central venogram was performed injecting 10 cc of contrast into the internal jugular vein and there was filling of the superior vena cava but could never get the guidewire to traverse this area therefore we went to the left side and after infiltration with Xylocaine the left IJ was entered using a supraclavicular approach guidewire passed into the right atrium under fluoroscopic guidance. After dilating the track appropriately 23 cm tunneled hemodialysis catheter was passed through a peel-away sheath position and the right atrium tunneled peripherally and secured with nylon sutures. The wound was closed Vicryl subcuticular fashion with Dermabond patient taken to recovery room in satisfactory condition for a chest x-ray Josephina GipJames Lawson, MD 06/24/2015 11:07 AM

## 2015-06-24 NOTE — H&P (Signed)
Vascular Surgery H&P  Chief Complaint: Needs hemodialysis catheter to initiate hemodialysis.  HPI: Kimberly [name] is a 59 y.o. female who presents for evaluation of needs hemWAVA KILDOWcatheter. To start dialysis later today   Past Medical History  Diagnosis Date  . HYPERLIPIDEMIA   . HYPERTENSION   . CAD, NATIVE VESSEL     May 10, 2010 cath showed a hyperdynamic LV function, she had dominant circumflex anatomy with a 70-80% small OM1. She had diffuse diabetic plaque particularly in the distal LAD. She nondominant RCA.  Nondominant  . PVD     CEA  . COPD   . Edema   . CHF (congestive heart failure) (HCC)     Preserved EF  . Cellulitis 10/15/2013    BILATERAL  . Diabetic neuropathy (HCC)   . Anasarca 05/2014  . Type II diabetes mellitus (HCC)   . Proliferative retinopathy     hx/notes 01/27/2010  . Peripheral neuropathy (HCC)     hx/notes 01/27/2010  . Complication of anesthesia     "they had trouble reviving me after the latest carotid OR" (04/06/2015)  . Asthma   . On home oxygen therapy     "2.5L; 24/7" (04/06/2015)  . Pneumonia "several times"  . Chronic bronchitis (HCC)     "often; usually q yr" (04/06/2015)  . Hypothyroidism   . History of hiatal hernia   . GERD (gastroesophageal reflux disease)   . 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 Arthritis     "aches and pains all over" (04/06/2015)  . Anxiety   . Depression   . Chronic kidney disease     "they've had me on dialysis periodically" (04/06/2015)  . Paralyzed vocal cords   . Heart attack [facility]) 2011    "on the table when I had my last carotid OR" (04/06/2015)  . Shortness of breath dyspnea     with exertion  . Sleep apnea     not on cpap    . Anemia   . Enlarged liver   . Constipation    Past Surgical History  Procedure Laterality Date  . Carotid endarterectomy Left X 2  . Vitrectomy  Bilateral   . Cataract extraction w/ intraocular lens  implant, bilateral Bilateral   . Eye surgery      numerous surgeries  . Colonoscopy     Social History   Social History  . Marital Status: Married    Spouse Name: N/A  . Number of Children: 0  . Years of Education: N/A   Occupational History  . disabled    Social History Main Topics  . Smoking status: Former Smoker -- 1.00 packs/day for 20 years    Types: Cigarettes    Quit date: 09/11/2005  . Smokeless tobacco: Never Used  . Alcohol Use: No  . Drug Use: No  . Sexual Activity: No   Other Topics Concern  . None   Social History Narrative   Family History  Problem Relation Age of Onset  . Cancer Mother     Breast, NHL  . Stroke Mother   . Peripheral vascular disease Father   . CAD Father 32  . Heart attack Father   . Hypertension Father   . Asthma Father    Allergies  Allergen Reactions  . Ciprofloxacin Itching  . Epinephrine     Increased heart  rate   Prior to Admission medications   Medication Sig Start Date End Date Taking? Authorizing Provider  acetaminophen (TYLENOL) 325 MG tablet Take 650 mg by mouth every 6 (six) hours as needed for mild pain.   Yes Historical Provider, MD  albuterol (PROVENTIL HFA;VENTOLIN HFA) 108 (90 BASE) MCG/ACT inhaler Inhale 1-2 puffs into the lungs every 6 (six) hours as needed for wheezing or shortness of breath.   Yes Historical Provider, MD  aspirin EC 81 MG tablet Take 81 mg by mouth daily.   Yes Historical Provider, MD  insulin aspart (NOVOLOG) 100 UNIT/ML injection Inject 6 Units into the skin 3 (three) times daily with meals. 07/05/14  Yes Zannie CovePreetha Joseph, MD  insulin glargine (LANTUS) 100 UNIT/ML injection Inject 60 Units into the skin 2 (two) times daily.    Yes Historical Provider, MD  levothyroxine (SYNTHROID, LEVOTHROID) 75 MCG tablet Take 75 mcg by mouth daily before breakfast.   Yes Historical Provider, MD  metolazone (ZAROXOLYN) 10 MG tablet Take 10 mg by mouth  daily.   Yes Historical Provider, MD  metoprolol succinate (TOPROL-XL) 50 MG 24 hr tablet Take 1 tablet (50 mg total) by mouth daily. Take 1 and 1/2 tabs daily = 75 mg daily Patient taking differently: Take 50 mg by mouth daily.  04/20/15  Yes Catarina Hartshornavid Tat, MD  Multiple Vitamins-Minerals (MULTIVITAMIN PO) Take 1 tablet by mouth daily.   Yes Historical Provider, MD  potassium chloride SA (KLOR-CON M20) 20 MEQ tablet Take 2 tablets (40 mEq total) by mouth 2 (two) times daily. 09/18/14  Yes Lewayne BuntingBrian S Crenshaw, MD  torsemide (DEMADEX) 100 MG tablet Take 100 mg by mouth 2 (two) times daily.   Yes Historical Provider, MD     Positive ROS: All other systems have been reviewed and were otherwise negative with the exception of those mentioned in the HPI and as above.  Physical Exam: Filed Vitals:   06/24/15 0740  BP: 179/66  Pulse: 85  Temp: 97.9 F (36.6 C)  Resp: 18    General: Alert, no acute distress-obese  HEENT: Normal for age Cardiovascular: Regular rate and rhythm. Carotid pulses 2+, no bruits audible Respiratory: Clear to auscultation. No cyanosis, no use of accessory musculature GI: No organomegaly, abdomen is soft and non-tender Skin: No lesions in the area of chief complaint Neurologic: Sensation intact distally Psychiatric: Patient is competent for consent with normal mood and affect Musculoskeletal: No obvious deformities    Assessment/Plan:  Plan insertion of tunneled hemodialysis catheter today and patient will have dialysis later today Patient to be evaluated by Dr. Edilia Boickson in the office tomorrow for possible access next week or so   Josephina GipJames Lawson, MD 06/24/2015 9:42 AM

## 2015-06-24 NOTE — Progress Notes (Signed)
Patient states she is unable to take beta blocker this am d/t requiring it to be taken with applesauce. Notified Dr. Jean RosenthalJackson.

## 2015-06-24 NOTE — Anesthesia Preprocedure Evaluation (Addendum)
Anesthesia Evaluation  Patient identified by MRN, date of birth, ID band Patient awake    Reviewed: Allergy & Precautions, NPO status , Patient's Chart, lab work & pertinent test results  History of Anesthesia Complications Negative for: history of anesthetic complications  Airway Mallampati: II  TM Distance: >3 FB Neck ROM: Full    Dental  (+) Dental Advisory Given   Pulmonary sleep apnea and Continuous Positive Airway Pressure Ventilation , COPD,  COPD inhaler and oxygen dependent, former smoker (quit '07),    breath sounds clear to auscultation       Cardiovascular hypertension, Pt. on medications and Pt. on home beta blockers (-) angina+ CAD, + Peripheral Vascular Disease and + DOE   Rhythm:Regular Rate:Normal  9/15 ECHO: EF 55-60%, normal wall motion, grade 2 diastolic dysfunction, moderate mitral stenosis (mean 7 mmHg),    Neuro/Psych Anxiety Depression CVA (R hemiparesis), Residual Symptoms    GI/Hepatic Neg liver ROS, GERD  Controlled,  Endo/Other  diabetes (glu 190), Insulin DependentHypothyroidism Morbid obesity  Renal/GU Renal Insufficiency and ESRFRenal disease (K+ 3.8)     Musculoskeletal  (+) Arthritis ,   Abdominal (+) + obese,   Peds  Hematology   Anesthesia Other Findings   Reproductive/Obstetrics                         Anesthesia Physical Anesthesia Plan  ASA: IV  Anesthesia Plan: MAC   Post-op Pain Management:    Induction:   Airway Management Planned: Natural Airway and Simple Face Mask  Additional Equipment:   Intra-op Plan:   Post-operative Plan:   Informed Consent: I have reviewed the patients History and Physical, chart, labs and discussed the procedure including the risks, benefits and alternatives for the proposed anesthesia with the patient or authorized representative who has indicated his/her understanding and acceptance.   Dental advisory  given  Plan Discussed with: CRNA and Surgeon  Anesthesia Plan Comments: (Plan routine monitors, MAC )        Anesthesia Quick Evaluation

## 2015-06-25 ENCOUNTER — Encounter (HOSPITAL_COMMUNITY): Payer: Managed Care, Other (non HMO)

## 2015-06-25 ENCOUNTER — Ambulatory Visit: Payer: Managed Care, Other (non HMO) | Admitting: Vascular Surgery

## 2015-06-25 ENCOUNTER — Other Ambulatory Visit (HOSPITAL_COMMUNITY): Payer: Managed Care, Other (non HMO)

## 2015-06-25 ENCOUNTER — Encounter (HOSPITAL_COMMUNITY): Payer: Self-pay | Admitting: Vascular Surgery

## 2015-07-02 ENCOUNTER — Encounter: Payer: Self-pay | Admitting: Vascular Surgery

## 2015-07-07 ENCOUNTER — Ambulatory Visit (INDEPENDENT_AMBULATORY_CARE_PROVIDER_SITE_OTHER)
Admission: RE | Admit: 2015-07-07 | Discharge: 2015-07-07 | Disposition: A | Discharge status: Home / Self Care | Payer: Managed Care, Other (non HMO) | Source: Ambulatory Visit | Attending: Vascular Surgery | Admitting: Vascular Surgery

## 2015-07-07 ENCOUNTER — Encounter: Payer: Self-pay | Admitting: Vascular Surgery

## 2015-07-07 ENCOUNTER — Ambulatory Visit (INDEPENDENT_AMBULATORY_CARE_PROVIDER_SITE_OTHER): Payer: Managed Care, Other (non HMO) | Admitting: Vascular Surgery

## 2015-07-07 ENCOUNTER — Other Ambulatory Visit: Payer: Self-pay

## 2015-07-07 ENCOUNTER — Encounter (HOSPITAL_COMMUNITY): Payer: Self-pay | Admitting: *Deleted

## 2015-07-07 VITALS — BP 125/59 | HR 77 | Ht 64.0 in | Wt 323.0 lb

## 2015-07-07 DIAGNOSIS — N189 Chronic kidney disease, unspecified: Secondary | ICD-10-CM | POA: Diagnosis not present

## 2015-07-07 DIAGNOSIS — Z01810 Encounter for preprocedural cardiovascular examination: Secondary | ICD-10-CM

## 2015-07-07 DIAGNOSIS — N185 Chronic kidney disease, stage 5: Secondary | ICD-10-CM | POA: Diagnosis not present

## 2015-07-07 MED ORDER — SODIUM CHLORIDE 0.9 % IV SOLN
INTRAVENOUS | Status: DC
  Administered 2015-07-08 (×2): via INTRAVENOUS

## 2015-07-07 MED ORDER — CHLORHEXIDINE GLUCONATE CLOTH 2 % EX PADS: 6.0000 | MEDICATED_PAD | Freq: Once | CUTANEOUS | Status: DC

## 2015-07-07 MED ORDER — CEFUROXIME SODIUM 1.5 G IJ SOLR
1.5000 g | INTRAMUSCULAR | Status: AC
  Administered 2015-07-08: 1.5 g via INTRAVENOUS

## 2015-07-07 MED ORDER — MUPIROCIN 2 % EX OINT: 1.0000 "application " | TOPICAL_OINTMENT | Freq: Once | CUTANEOUS | Status: DC

## 2015-07-07 NOTE — Progress Notes (Signed)
Pt denies any recent chest pain or sob. She states her Lantus Insulin dosage has been reduced from 60 unit BID to 50 units BID.  Pt unable to take medication without applesauce, therefore she will not be taking any medications the morning of surgery. She states she was instructed to take half of her regular dose of Lantus tonight.  Pt was positive for MRSA 2 weeks ago but states she was not given the Mupirocin Ointment to take home with her to use.

## 2015-07-07 NOTE — Progress Notes (Signed)
 [facility]  Patient name: Kimberly [name] MRN: [id number] DOB: 01/08/1956 Sex: female  REASON FOR CONSULT: to evaluate for hemodialysis access. Referred by Dr. Goldsborough.  HPI: Kimberly [name] is a 59 y.o. female, who is who is referred for evaluation for hemodialysis access. She has a functioning dialysis catheter and dialyzes on Monday Wednesdays and Fridays. Her end-stage renal disease is secondary to diabetes.  She does admit to some dyspnea on exertion and has a history of congestive heart failure although no recent exacerbation of symptoms. She has a long-standing history of chest pain however this has been chronic.  I have reviewed the records from [facility]. She has a history of diastolic right-sided heart failure, COPD, and carotid disease. She's had previous carotid endarterectomy and redo carotid endarterectomy by Dr. Lawson. She also has hypertension, diabetes, coronary artery disease, and morbid obesity. GFR is 18. This was on 06/10/2015.  Past Medical History  Diagnosis Date  . HYPERLIPIDEMIA   . HYPERTENSION   . CAD, NATIVE VESSEL     May 10, 2010 cath showed a hyperdynamic LV function, she had dominant circumflex anatomy with a 70-80% small OM1. She had diffuse diabetic plaque particularly in the distal LAD. She nondominant RCA.  Nondominant  . PVD     CEA  . COPD   . Edema   . CHF (congestive heart failure) (HCC)     Preserved EF  . Cellulitis 10/15/2013    BILATERAL  . Diabetic neuropathy (HCC)   . Anasarca 05/2014  . Type II diabetes mellitus (HCC)   . Proliferative retinopathy     hx/notes 01/27/2010  . Peripheral neuropathy (HCC)     hx/notes 01/27/2010  . Complication of anesthesia     "they had trouble reviving me after the latest carotid OR" (04/06/2015)  . Asthma   . On home oxygen therapy     "2.5L; 24/7" (04/06/2015)  . Pneumonia "several times"  . Chronic bronchitis (HCC)     "often; usually q yr"  (04/06/2015)  . Hypothyroidism   . History of hiatal hernia   . GERD (gastroesophageal reflux disease)   . 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 Arthritis     "aches and pains all over" (04/06/2015)  . Anxiety   . Depression   . Chronic kidney disease     "they've had me on dialysis periodically" (04/06/2015)  . Paralyzed vocal cords   . Heart attack (HCC) 2011    "on the table when I had my last carotid OR" (04/06/2015)  . Shortness of breath dyspnea     with exertion  . Sleep apnea     not on cpap    . Anemia   . Enlarged liver   . Constipation     Family History  Problem Relation Age of Onset  . Cancer Mother     Breast, NHL  . Stroke Mother   . Peripheral vascular disease Father   . CAD Father 30  . Heart attack Father   . Hypertension Father   . Asthma Father   . Heart disease Father     before age 60    SOCIAL HISTORY: Social History   Social History  . Marital Status: Married    Spouse Name: N/A  . Number of Children: 0  . Years   of Education: N/A   Occupational History  . disabled    Social History Main Topics  . Smoking status: Former Smoker -- 1.00 packs/day for 20 years    Types: Cigarettes    Quit date: 09/11/2005  . Smokeless tobacco: Never Used  . Alcohol Use: No  . Drug Use: No  . Sexual Activity: No   Other Topics Concern  . Not on file   Social History Narrative    Allergies  Allergen Reactions  . Ciprofloxacin Itching  . Epinephrine     Increased heart rate    Current Outpatient Prescriptions  Medication Sig Dispense Refill  . acetaminophen (TYLENOL) 325 MG tablet Take 650 mg by mouth every 6 (six) hours as needed for mild pain.    Marland Kitchen albuterol (PROVENTIL HFA;VENTOLIN HFA) 108 (90 BASE) MCG/ACT inhaler Inhale 1-2 puffs into the lungs every 6 (six) hours as needed for wheezing or shortness of breath.    Marland Kitchen aspirin  EC 81 MG tablet Take 81 mg by mouth daily.    . insulin aspart (NOVOLOG) 100 UNIT/ML injection Inject 6 Units into the skin 3 (three) times daily with meals.    . insulin glargine (LANTUS) 100 UNIT/ML injection Inject 60 Units into the skin 2 (two) times daily.     Marland Kitchen levothyroxine (SYNTHROID, LEVOTHROID) 75 MCG tablet Take 75 mcg by mouth daily before breakfast.    . metoprolol succinate (TOPROL-XL) 50 MG 24 hr tablet Take 1 tablet (50 mg total) by mouth daily. Take 1 and 1/2 tabs daily = 75 mg daily (Patient taking differently: Take 50 mg by mouth daily. ) 30 tablet 1  . Multiple Vitamins-Minerals (MULTIVITAMIN PO) Take 1 tablet by mouth daily.    Marland Kitchen oxyCODONE-acetaminophen (PERCOCET/ROXICET) 5-325 MG tablet Take 1 tablet by mouth every 6 (six) hours as needed. 15 tablet 0  . metolazone (ZAROXOLYN) 10 MG tablet Take 10 mg by mouth daily.    . potassium chloride SA (KLOR-CON M20) 20 MEQ tablet Take 2 tablets (40 mEq total) by mouth 2 (two) times daily. (Patient not taking: Reported on 07/07/2015) 360 tablet 3  . torsemide (DEMADEX) 100 MG tablet Take 100 mg by mouth 2 (two) times daily.     No current facility-administered medications for this visit.    REVIEW OF SYSTEMS:   denotes positive finding,  denotes negative finding Cardiac  Comments:  Chest pain or chest pressure: X Chronic. No acute changes.  Shortness of breath upon exertion: X   Short of breath when lying flat: X   Irregular heart rhythm:        Vascular    Pain in calf, thigh, or hip brought on by ambulation:    Pain in feet at night that wakes you up from your sleep:     Blood clot in your veins:    Leg swelling:         Pulmonary    Oxygen at home: X   Productive cough:     Wheezing:  X       Neurologic    Sudden weakness in arms or legs:     Sudden numbness in arms or legs:     Sudden onset of difficulty speaking or slurred speech:    Temporary loss of vision in one eye:     Problems with dizziness:          Gastrointestinal    Blood in stool:     Vomited blood:  Genitourinary    Burning when urinating:     Blood in urine:        Psychiatric    Major depression:         Hematologic    Bleeding problems:    Problems with blood clotting too easily:        Skin    Rashes or ulcers:        Constitutional    Fever or chills:      PHYSICAL EXAM: Filed Vitals:   07/07/15 0920  BP: 125/59  Pulse: 77  Height: 5\' 4"  (1.626 m)  Weight: 323 lb (146.512 kg)  SpO2: 96%    GENERAL: The patient is a well-nourished female, in no acute distress. The vital signs are documented above. CARDIAC: There is a regular rate and rhythm.  VASCULAR: she has a regular rate and rhythm. She has a palpable right radial pulse and a diminished left radial pulse. PULMONARY: There is good air exchange bilaterally without wheezing or rales. ABDOMEN: Soft and non-tender with normal pitched bowel sounds.  MUSCULOSKELETAL: There are no major deformities or cyanosis. NEUROLOGIC: No focal weakness or paresthesias are detected. SKIN: There are no ulcers or rashes noted. PSYCHIATRIC: The patient has a normal affect.  DATA:  UPPER EXTREMITY VEIN MAP: I have independently interpreted her upper extremity vein map. On the left side the forearm cephalic vein looks small and the upper arm cephalic vein cannot be visualized.   On the right side, the forearm cephalic vein looks small. The upper arm cephalic vein is marginal but might potentially be usable for fistula. The basilic vein on the right looks small.  UPPER EXTREMITY ARTERIAL DUPLEX: I have independently interpreted her upper extremity arterial duplex which shows biphasic Doppler signals in the radial and ulnar positions bilaterally.  MEDICAL ISSUES:  STAGE V CHRONIC KIDNEY DISEASE: I have recommended that we place access in the right arm. She has a tunneled dialysis catheter on the left which could potentially obstruct flow. In addition her veins on the  left appear to be smaller. In addition, she's had previous injuries to the left arm and was told in the past to not take blood pressures not arm as they were concerned about the circulation. I have recommended placement of a upper arm brachiocephalic fistula if possible. If this is not possible and she could potentially have a basilic vein transposition. If neither were possible she would require placement of an AV graft. I have explained the indications for placement of an AV fistula or AV graft. I've explained that if at all possible we will place an AV fistula.  I have reviewed the risks of placement of an AV fistula including but not limited to: failure of the fistula to mature, need for subsequent interventions, and thrombosis. In addition I have reviewed the potential complications of placement of an AV graft. These risks include, but are not limited to, graft thrombosis, graft infection, wound healing problems, bleeding, arm swelling, and steal syndrome. All the patient's questions were answered and they are agreeable to proceed with surgery. We have been asked to schedule at this this week and I have scheduled it for tomorrow.   Waverly Ferrariickson, [facility] [place] Beeper: [phone]

## 2015-07-08 ENCOUNTER — Encounter (HOSPITAL_COMMUNITY)
Admission: RE | Disposition: A | Discharge status: Home Health | Payer: Self-pay | Source: Ambulatory Visit | Attending: Internal Medicine

## 2015-07-08 ENCOUNTER — Inpatient Hospital Stay (HOSPITAL_COMMUNITY): Payer: Managed Care, Other (non HMO)

## 2015-07-08 ENCOUNTER — Inpatient Hospital Stay (HOSPITAL_COMMUNITY)
Admission: RE | Admit: 2015-07-08 | Discharge: 2015-07-20 | DRG: 252 | Disposition: A | Discharge status: Home Health | Payer: Managed Care, Other (non HMO) | Source: Ambulatory Visit | Attending: Internal Medicine | Admitting: Internal Medicine

## 2015-07-08 ENCOUNTER — Ambulatory Visit (HOSPITAL_COMMUNITY): Payer: Managed Care, Other (non HMO) | Admitting: Certified Registered Nurse Anesthetist

## 2015-07-08 ENCOUNTER — Encounter (HOSPITAL_COMMUNITY): Payer: Self-pay | Admitting: Certified Registered Nurse Anesthetist

## 2015-07-08 DIAGNOSIS — Z8673 Personal history of transient ischemic attack (TIA), and cerebral infarction without residual deficits: Secondary | ICD-10-CM | POA: Diagnosis not present

## 2015-07-08 DIAGNOSIS — E1122 Type 2 diabetes mellitus with diabetic chronic kidney disease: Secondary | ICD-10-CM | POA: Diagnosis present

## 2015-07-08 DIAGNOSIS — N179 Acute kidney failure, unspecified: Secondary | ICD-10-CM | POA: Diagnosis present

## 2015-07-08 DIAGNOSIS — R5381 Other malaise: Secondary | ICD-10-CM | POA: Diagnosis not present

## 2015-07-08 DIAGNOSIS — Z794 Long term (current) use of insulin: Secondary | ICD-10-CM | POA: Diagnosis not present

## 2015-07-08 DIAGNOSIS — I679 Cerebrovascular disease, unspecified: Secondary | ICD-10-CM | POA: Diagnosis present

## 2015-07-08 DIAGNOSIS — I050 Rheumatic mitral stenosis: Secondary | ICD-10-CM | POA: Diagnosis present

## 2015-07-08 DIAGNOSIS — I480 Paroxysmal atrial fibrillation: Secondary | ICD-10-CM | POA: Diagnosis present

## 2015-07-08 DIAGNOSIS — Z4659 Encounter for fitting and adjustment of other gastrointestinal appliance and device: Secondary | ICD-10-CM

## 2015-07-08 DIAGNOSIS — I469 Cardiac arrest, cause unspecified: Secondary | ICD-10-CM | POA: Diagnosis not present

## 2015-07-08 DIAGNOSIS — I10 Essential (primary) hypertension: Secondary | ICD-10-CM | POA: Diagnosis not present

## 2015-07-08 DIAGNOSIS — D631 Anemia in chronic kidney disease: Secondary | ICD-10-CM | POA: Diagnosis present

## 2015-07-08 DIAGNOSIS — J9621 Acute and chronic respiratory failure with hypoxia: Secondary | ICD-10-CM | POA: Diagnosis not present

## 2015-07-08 DIAGNOSIS — J9601 Acute respiratory failure with hypoxia: Secondary | ICD-10-CM

## 2015-07-08 DIAGNOSIS — Y832 Surgical operation with anastomosis, bypass or graft as the cause of abnormal reaction of the patient, or of later complication, without mention of misadventure at the time of the procedure: Secondary | ICD-10-CM | POA: Diagnosis not present

## 2015-07-08 DIAGNOSIS — Z79899 Other long term (current) drug therapy: Secondary | ICD-10-CM

## 2015-07-08 DIAGNOSIS — N189 Chronic kidney disease, unspecified: Secondary | ICD-10-CM | POA: Diagnosis not present

## 2015-07-08 DIAGNOSIS — E871 Hypo-osmolality and hyponatremia: Secondary | ICD-10-CM | POA: Diagnosis present

## 2015-07-08 DIAGNOSIS — Y92234 Operating room of hospital as the place of occurrence of the external cause: Secondary | ICD-10-CM | POA: Diagnosis not present

## 2015-07-08 DIAGNOSIS — I2510 Atherosclerotic heart disease of native coronary artery without angina pectoris: Secondary | ICD-10-CM | POA: Diagnosis present

## 2015-07-08 DIAGNOSIS — R570 Cardiogenic shock: Secondary | ICD-10-CM

## 2015-07-08 DIAGNOSIS — I272 Other secondary pulmonary hypertension: Secondary | ICD-10-CM | POA: Diagnosis present

## 2015-07-08 DIAGNOSIS — N185 Chronic kidney disease, stage 5: Secondary | ICD-10-CM | POA: Diagnosis not present

## 2015-07-08 DIAGNOSIS — K219 Gastro-esophageal reflux disease without esophagitis: Secondary | ICD-10-CM | POA: Diagnosis present

## 2015-07-08 DIAGNOSIS — E11319 Type 2 diabetes mellitus with unspecified diabetic retinopathy without macular edema: Secondary | ICD-10-CM | POA: Diagnosis present

## 2015-07-08 DIAGNOSIS — F418 Other specified anxiety disorders: Secondary | ICD-10-CM | POA: Diagnosis present

## 2015-07-08 DIAGNOSIS — J3800 Paralysis of vocal cords and larynx, unspecified: Secondary | ICD-10-CM | POA: Diagnosis present

## 2015-07-08 DIAGNOSIS — E872 Acidosis: Secondary | ICD-10-CM | POA: Diagnosis present

## 2015-07-08 DIAGNOSIS — Z87891 Personal history of nicotine dependence: Secondary | ICD-10-CM | POA: Diagnosis not present

## 2015-07-08 DIAGNOSIS — Z9981 Dependence on supplemental oxygen: Secondary | ICD-10-CM

## 2015-07-08 DIAGNOSIS — I248 Other forms of acute ischemic heart disease: Secondary | ICD-10-CM | POA: Diagnosis present

## 2015-07-08 DIAGNOSIS — Z6841 Body Mass Index (BMI) 40.0 and over, adult: Secondary | ICD-10-CM | POA: Diagnosis not present

## 2015-07-08 DIAGNOSIS — I4891 Unspecified atrial fibrillation: Secondary | ICD-10-CM | POA: Diagnosis not present

## 2015-07-08 DIAGNOSIS — N938 Other specified abnormal uterine and vaginal bleeding: Secondary | ICD-10-CM | POA: Diagnosis not present

## 2015-07-08 DIAGNOSIS — E662 Morbid (severe) obesity with alveolar hypoventilation: Secondary | ICD-10-CM | POA: Diagnosis present

## 2015-07-08 DIAGNOSIS — Z9289 Personal history of other medical treatment: Secondary | ICD-10-CM

## 2015-07-08 DIAGNOSIS — I97711 Intraoperative cardiac arrest during other surgery: Secondary | ICD-10-CM | POA: Diagnosis not present

## 2015-07-08 DIAGNOSIS — R400 Somnolence: Secondary | ICD-10-CM | POA: Diagnosis not present

## 2015-07-08 DIAGNOSIS — E1165 Type 2 diabetes mellitus with hyperglycemia: Secondary | ICD-10-CM | POA: Diagnosis present

## 2015-07-08 DIAGNOSIS — I5032 Chronic diastolic (congestive) heart failure: Secondary | ICD-10-CM | POA: Diagnosis present

## 2015-07-08 DIAGNOSIS — E785 Hyperlipidemia, unspecified: Secondary | ICD-10-CM | POA: Diagnosis present

## 2015-07-08 DIAGNOSIS — N186 End stage renal disease: Secondary | ICD-10-CM | POA: Diagnosis present

## 2015-07-08 DIAGNOSIS — Z538 Procedure and treatment not carried out for other reasons: Secondary | ICD-10-CM | POA: Diagnosis not present

## 2015-07-08 DIAGNOSIS — I5033 Acute on chronic diastolic (congestive) heart failure: Secondary | ICD-10-CM | POA: Diagnosis present

## 2015-07-08 DIAGNOSIS — J449 Chronic obstructive pulmonary disease, unspecified: Secondary | ICD-10-CM | POA: Diagnosis present

## 2015-07-08 DIAGNOSIS — Z01818 Encounter for other preprocedural examination: Secondary | ICD-10-CM

## 2015-07-08 DIAGNOSIS — I9589 Other hypotension: Secondary | ICD-10-CM | POA: Diagnosis not present

## 2015-07-08 DIAGNOSIS — N939 Abnormal uterine and vaginal bleeding, unspecified: Secondary | ICD-10-CM | POA: Diagnosis not present

## 2015-07-08 DIAGNOSIS — Z992 Dependence on renal dialysis: Secondary | ICD-10-CM | POA: Diagnosis not present

## 2015-07-08 DIAGNOSIS — J9600 Acute respiratory failure, unspecified whether with hypoxia or hypercapnia: Secondary | ICD-10-CM | POA: Diagnosis not present

## 2015-07-08 DIAGNOSIS — I2721 Secondary pulmonary arterial hypertension: Secondary | ICD-10-CM | POA: Diagnosis present

## 2015-07-08 DIAGNOSIS — G629 Polyneuropathy, unspecified: Secondary | ICD-10-CM | POA: Diagnosis present

## 2015-07-08 DIAGNOSIS — E039 Hypothyroidism, unspecified: Secondary | ICD-10-CM | POA: Diagnosis present

## 2015-07-08 DIAGNOSIS — J45909 Unspecified asthma, uncomplicated: Secondary | ICD-10-CM | POA: Diagnosis present

## 2015-07-08 DIAGNOSIS — E1140 Type 2 diabetes mellitus with diabetic neuropathy, unspecified: Secondary | ICD-10-CM | POA: Diagnosis present

## 2015-07-08 DIAGNOSIS — R079 Chest pain, unspecified: Secondary | ICD-10-CM | POA: Diagnosis not present

## 2015-07-08 DIAGNOSIS — IMO0002 Reserved for concepts with insufficient information to code with codable children: Secondary | ICD-10-CM | POA: Diagnosis present

## 2015-07-08 DIAGNOSIS — Z452 Encounter for adjustment and management of vascular access device: Secondary | ICD-10-CM

## 2015-07-08 DIAGNOSIS — Z7982 Long term (current) use of aspirin: Secondary | ICD-10-CM | POA: Diagnosis not present

## 2015-07-08 DIAGNOSIS — R4182 Altered mental status, unspecified: Secondary | ICD-10-CM | POA: Diagnosis present

## 2015-07-08 DIAGNOSIS — I132 Hypertensive heart and chronic kidney disease with heart failure and with stage 5 chronic kidney disease, or end stage renal disease: Secondary | ICD-10-CM | POA: Diagnosis present

## 2015-07-08 DIAGNOSIS — I959 Hypotension, unspecified: Secondary | ICD-10-CM | POA: Diagnosis present

## 2015-07-08 HISTORY — PX: AV FISTULA PLACEMENT: SHX1204

## 2015-07-08 LAB — POCT I-STAT 3, ART BLOOD GAS (G3+)
ACID-BASE DEFICIT: 9 mmol/L — AB (ref 0.0–2.0)
Acid-base deficit: 3 mmol/L — ABNORMAL HIGH (ref 0.0–2.0)
Bicarbonate: 17.4 mEq/L — ABNORMAL LOW (ref 20.0–24.0)
Bicarbonate: 23 mEq/L (ref 20.0–24.0)
O2 SAT: 100 %
O2 Saturation: 100 %
PCO2 ART: 41.3 mmHg (ref 35.0–45.0)
PO2 ART: 428 mmHg — AB (ref 80.0–100.0)
PO2 ART: 483 mmHg — AB (ref 80.0–100.0)
Patient temperature: 96.6
TCO2: 18 mmol/L (ref 0–100)
TCO2: 24 mmol/L (ref 0–100)
pCO2 arterial: 36.6 mmHg (ref 35.0–45.0)
pH, Arterial: 7.285 — ABNORMAL LOW (ref 7.350–7.450)
pH, Arterial: 7.349 — ABNORMAL LOW (ref 7.350–7.450)

## 2015-07-08 LAB — PROTIME-INR
INR: 1.59 — AB (ref 0.00–1.49)
PROTHROMBIN TIME: 19 s — AB (ref 11.6–15.2)

## 2015-07-08 LAB — CK TOTAL AND CKMB (NOT AT ARMC)
CK, MB: 2.2 ng/mL (ref 0.5–5.0)
RELATIVE INDEX: INVALID (ref 0.0–2.5)
Total CK: 33 U/L — ABNORMAL LOW (ref 38–234)

## 2015-07-08 LAB — CBC
HEMATOCRIT: 32.6 % — AB (ref 36.0–46.0)
Hemoglobin: 10.6 g/dL — ABNORMAL LOW (ref 12.0–15.0)
MCH: 32.4 pg (ref 26.0–34.0)
MCHC: 32.5 g/dL (ref 30.0–36.0)
MCV: 99.7 fL (ref 78.0–100.0)
PLATELETS: 145 10*3/uL — AB (ref 150–400)
RBC: 3.27 MIL/uL — ABNORMAL LOW (ref 3.87–5.11)
RDW: 19.5 % — AB (ref 11.5–15.5)
WBC: 19.8 10*3/uL — ABNORMAL HIGH (ref 4.0–10.5)

## 2015-07-08 LAB — COMPREHENSIVE METABOLIC PANEL
ALT: 41 U/L (ref 14–54)
ANION GAP: 15 (ref 5–15)
AST: 87 U/L — ABNORMAL HIGH (ref 15–41)
Albumin: 2.8 g/dL — ABNORMAL LOW (ref 3.5–5.0)
Alkaline Phosphatase: 136 U/L — ABNORMAL HIGH (ref 38–126)
BILIRUBIN TOTAL: 2.2 mg/dL — AB (ref 0.3–1.2)
BUN: 26 mg/dL — ABNORMAL HIGH (ref 6–20)
CO2: 18 mmol/L — ABNORMAL LOW (ref 22–32)
Calcium: 8.3 mg/dL — ABNORMAL LOW (ref 8.9–10.3)
Chloride: 100 mmol/L — ABNORMAL LOW (ref 101–111)
Creatinine, Ser: 3.82 mg/dL — ABNORMAL HIGH (ref 0.44–1.00)
GFR, EST AFRICAN AMERICAN: 14 mL/min — AB (ref >60)
GFR, EST NON AFRICAN AMERICAN: 12 mL/min — AB (ref >60)
Glucose, Bld: 255 mg/dL — ABNORMAL HIGH (ref 65–99)
POTASSIUM: 3.1 mmol/L — AB (ref 3.5–5.1)
Sodium: 133 mmol/L — ABNORMAL LOW (ref 135–145)
TOTAL PROTEIN: 6.6 g/dL (ref 6.5–8.1)

## 2015-07-08 LAB — URINALYSIS, ROUTINE W REFLEX MICROSCOPIC
GLUCOSE, UA: 100 mg/dL — AB
Ketones, ur: 15 mg/dL — AB
Nitrite: POSITIVE — AB
Protein, ur: 100 mg/dL — AB
SPECIFIC GRAVITY, URINE: 1.023 (ref 1.005–1.030)
Urobilinogen, UA: 1 mg/dL (ref 0.0–1.0)
pH: 5 (ref 5.0–8.0)

## 2015-07-08 LAB — GLUCOSE, CAPILLARY
GLUCOSE-CAPILLARY: 172 mg/dL — AB (ref 65–99)
GLUCOSE-CAPILLARY: 271 mg/dL — AB (ref 65–99)
Glucose-Capillary: 173 mg/dL — ABNORMAL HIGH (ref 65–99)
Glucose-Capillary: 212 mg/dL — ABNORMAL HIGH (ref 65–99)
Glucose-Capillary: 210 mg/dL — ABNORMAL HIGH (ref 65–99)

## 2015-07-08 LAB — URINE MICROSCOPIC-ADD ON

## 2015-07-08 LAB — POCT I-STAT 4, (NA,K, GLUC, HGB,HCT)
Glucose, Bld: 188 mg/dL — ABNORMAL HIGH (ref 65–99)
HCT: 37 % (ref 36.0–46.0)
Hemoglobin: 12.6 g/dL (ref 12.0–15.0)
POTASSIUM: 3.5 mmol/L (ref 3.5–5.1)
SODIUM: 133 mmol/L — AB (ref 135–145)

## 2015-07-08 LAB — TROPONIN I
TROPONIN I: 0.07 ng/mL — AB (ref <0.031)
Troponin I: 0.2 ng/mL — ABNORMAL HIGH (ref <0.031)

## 2015-07-08 SURGERY — ARTERIOVENOUS (AV) FISTULA CREATION
Anesthesia: Monitor Anesthesia Care | Site: Arm Lower | Laterality: Right

## 2015-07-08 MED ORDER — DEXTROSE 5 % IV SOLN: 1.5000 g | Freq: Two times a day (BID) | INTRAVENOUS | Status: DC

## 2015-07-08 MED ORDER — LIDOCAINE HCL (CARDIAC) 20 MG/ML IV SOLN
INTRAVENOUS | Status: AC
  Filled 2015-07-08: qty 5

## 2015-07-08 MED ORDER — ACETAMINOPHEN 325 MG PO TABS: 325.0000 mg | ORAL_TABLET | ORAL | Status: DC | PRN

## 2015-07-08 MED ORDER — SODIUM CHLORIDE 0.9 % IV SOLN
INTRAVENOUS | Status: DC | PRN
  Administered 2015-07-08: 14:00:00

## 2015-07-08 MED ORDER — PROPOFOL 500 MG/50ML IV EMUL
INTRAVENOUS | Status: DC | PRN
  Administered 2015-07-08: 25 ug/kg/min via INTRAVENOUS

## 2015-07-08 MED ORDER — FENTANYL CITRATE (PF) 100 MCG/2ML IJ SOLN: 50.0000 ug | INTRAMUSCULAR | Status: DC | PRN

## 2015-07-08 MED ORDER — FENTANYL CITRATE (PF) 100 MCG/2ML IJ SOLN
50.0000 ug | INTRAMUSCULAR | Status: DC | PRN
  Administered 2015-07-08: 50 ug via INTRAVENOUS
  Filled 2015-07-08: qty 2

## 2015-07-08 MED ORDER — PHENOL 1.4 % MT LIQD
1.0000 | OROMUCOSAL | Status: DC | PRN
Start: 2015-07-08 — End: 2015-07-20
  Filled 2015-07-08: qty 177

## 2015-07-08 MED ORDER — LIDOCAINE HCL (PF) 1 % IJ SOLN
INTRAMUSCULAR | Status: DC | PRN
  Administered 2015-07-08: 30 mL

## 2015-07-08 MED ORDER — THROMBIN 20000 UNITS EX SOLR
CUTANEOUS | Status: AC
  Filled 2015-07-08: qty 20000

## 2015-07-08 MED ORDER — HYDRALAZINE HCL 20 MG/ML IJ SOLN: 5.0000 mg | INTRAMUSCULAR | Status: DC | PRN

## 2015-07-08 MED ORDER — EPHEDRINE SULFATE 50 MG/ML IJ SOLN
INTRAMUSCULAR | Status: AC
  Filled 2015-07-08: qty 1

## 2015-07-08 MED ORDER — LIDOCAINE-EPINEPHRINE (PF) 1 %-1:200000 IJ SOLN
INTRAMUSCULAR | Status: AC
  Filled 2015-07-08: qty 30

## 2015-07-08 MED ORDER — SODIUM CHLORIDE 0.9 % IV SOLN
INTRAVENOUS | Status: DC | PRN
  Administered 2015-07-08: 15:00:00 via INTRAVENOUS

## 2015-07-08 MED ORDER — MIDAZOLAM HCL 2 MG/2ML IJ SOLN
INTRAMUSCULAR | Status: AC
  Filled 2015-07-08: qty 2

## 2015-07-08 MED ORDER — SODIUM CHLORIDE 0.9 % IV SOLN
INTRAVENOUS | Status: DC
  Administered 2015-07-08: 19:00:00 via INTRAVENOUS

## 2015-07-08 MED ORDER — ALBUMIN HUMAN 5 % IV SOLN
INTRAVENOUS | Status: DC | PRN
  Administered 2015-07-08: 15:00:00 via INTRAVENOUS

## 2015-07-08 MED ORDER — ENOXAPARIN SODIUM 30 MG/0.3ML ~~LOC~~ SOLN
30.0000 mg | SUBCUTANEOUS | Status: DC
  Administered 2015-07-08: 30 mg via SUBCUTANEOUS
  Filled 2015-07-08 (×2): qty 0.3

## 2015-07-08 MED ORDER — PHENYLEPHRINE HCL 10 MG/ML IJ SOLN
INTRAMUSCULAR | Status: DC | PRN
  Administered 2015-07-08 (×3): 80 ug via INTRAVENOUS

## 2015-07-08 MED ORDER — INSULIN ASPART 100 UNIT/ML ~~LOC~~ SOLN: 6.0000 [IU] | SUBCUTANEOUS | Status: DC

## 2015-07-08 MED ORDER — LEVOTHYROXINE SODIUM 100 MCG IV SOLR
37.5000 ug | Freq: Every day | INTRAVENOUS | Status: DC
  Administered 2015-07-09: 37.5 ug via INTRAVENOUS
  Filled 2015-07-08 (×2): qty 5

## 2015-07-08 MED ORDER — GLYCOPYRROLATE 0.2 MG/ML IJ SOLN
INTRAMUSCULAR | Status: AC
  Filled 2015-07-08: qty 1

## 2015-07-08 MED ORDER — HEPARIN SODIUM (PORCINE) 1000 UNIT/ML DIALYSIS
1000.0000 [IU] | INTRAMUSCULAR | Status: DC | PRN
  Administered 2015-07-08: 1000 [IU] via INTRAVENOUS_CENTRAL
  Filled 2015-07-08 (×2): qty 1

## 2015-07-08 MED ORDER — FENTANYL CITRATE (PF) 250 MCG/5ML IJ SOLN
INTRAMUSCULAR | Status: AC
  Filled 2015-07-08: qty 5

## 2015-07-08 MED ORDER — ROCURONIUM BROMIDE 100 MG/10ML IV SOLN
INTRAVENOUS | Status: DC | PRN
  Administered 2015-07-08: 50 mg via INTRAVENOUS

## 2015-07-08 MED ORDER — 0.9 % SODIUM CHLORIDE (POUR BTL) OPTIME
TOPICAL | Status: DC | PRN
Start: 2015-07-08 — End: 2015-07-08
  Administered 2015-07-08: 1000 mL

## 2015-07-08 MED ORDER — ANTISEPTIC ORAL RINSE SOLUTION (CORINZ)
7.0000 mL | Freq: Four times a day (QID) | OROMUCOSAL | Status: DC
  Administered 2015-07-09 – 2015-07-20 (×28): 7 mL via OROMUCOSAL

## 2015-07-08 MED ORDER — PHENYLEPHRINE HCL 10 MG/ML IJ SOLN
10.0000 mg | INTRAVENOUS | Status: DC | PRN
  Administered 2015-07-08: 100 ug/min via INTRAVENOUS
  Administered 2015-07-08: 200 ug/min via INTRAVENOUS

## 2015-07-08 MED ORDER — DEXTROSE 5 % IV SOLN
INTRAVENOUS | Status: AC
  Filled 2015-07-08: qty 1.5

## 2015-07-08 MED ORDER — SODIUM CHLORIDE 0.9 % IJ SOLN
INTRAMUSCULAR | Status: AC
  Filled 2015-07-08: qty 10

## 2015-07-08 MED ORDER — EPINEPHRINE HCL 0.1 MG/ML IJ SOSY
PREFILLED_SYRINGE | INTRAMUSCULAR | Status: DC | PRN
  Administered 2015-07-08: 1 mg via INTRAVENOUS
  Administered 2015-07-08: .8 mg via INTRAVENOUS
  Administered 2015-07-08: .2 mg via INTRAVENOUS

## 2015-07-08 MED ORDER — LIDOCAINE HCL (PF) 1 % IJ SOLN
INTRAMUSCULAR | Status: AC
  Filled 2015-07-08: qty 30

## 2015-07-08 MED ORDER — INSULIN ASPART 100 UNIT/ML ~~LOC~~ SOLN
0.0000 [IU] | SUBCUTANEOUS | Status: DC
  Administered 2015-07-08: 5 [IU] via SUBCUTANEOUS
  Administered 2015-07-08: 8 [IU] via SUBCUTANEOUS
  Administered 2015-07-09 (×2): 5 [IU] via SUBCUTANEOUS
  Administered 2015-07-09 (×3): 8 [IU] via SUBCUTANEOUS
  Administered 2015-07-09: 3 [IU] via SUBCUTANEOUS
  Administered 2015-07-10: 5 [IU] via SUBCUTANEOUS
  Administered 2015-07-10 (×3): 8 [IU] via SUBCUTANEOUS
  Administered 2015-07-10 (×2): 5 [IU] via SUBCUTANEOUS
  Administered 2015-07-11: 8 [IU] via SUBCUTANEOUS
  Administered 2015-07-11: 2 [IU] via SUBCUTANEOUS
  Administered 2015-07-11 (×2): 5 [IU] via SUBCUTANEOUS
  Administered 2015-07-11 – 2015-07-12 (×3): 3 [IU] via SUBCUTANEOUS

## 2015-07-08 MED ORDER — MIDAZOLAM HCL 5 MG/5ML IJ SOLN
INTRAMUSCULAR | Status: DC | PRN
  Administered 2015-07-08: 1 mg via INTRAVENOUS

## 2015-07-08 MED ORDER — SUCCINYLCHOLINE CHLORIDE 20 MG/ML IJ SOLN
INTRAMUSCULAR | Status: DC | PRN
  Administered 2015-07-08: 120 mg via INTRAVENOUS

## 2015-07-08 MED ORDER — ALBUTEROL SULFATE (2.5 MG/3ML) 0.083% IN NEBU
2.5000 mg | INHALATION_SOLUTION | Freq: Four times a day (QID) | RESPIRATORY_TRACT | Status: DC | PRN
  Administered 2015-07-11 – 2015-07-12 (×2): 2.5 mg via RESPIRATORY_TRACT
  Filled 2015-07-08 (×2): qty 3

## 2015-07-08 MED ORDER — ACETAMINOPHEN 650 MG RE SUPP: 325.0000 mg | RECTAL | Status: DC | PRN

## 2015-07-08 MED ORDER — EPHEDRINE SULFATE 50 MG/ML IJ SOLN
INTRAMUSCULAR | Status: DC | PRN
  Administered 2015-07-08 (×4): 10 mg via INTRAVENOUS

## 2015-07-08 MED ORDER — CHLORHEXIDINE GLUCONATE 0.12% ORAL RINSE (MEDLINE KIT)
15.0000 mL | Freq: Two times a day (BID) | OROMUCOSAL | Status: DC
  Administered 2015-07-08 – 2015-07-20 (×16): 15 mL via OROMUCOSAL
  Filled 2015-07-08: qty 15

## 2015-07-08 MED ORDER — PANTOPRAZOLE SODIUM 40 MG IV SOLR
40.0000 mg | Freq: Every day | INTRAVENOUS | Status: DC
  Administered 2015-07-09 – 2015-07-10 (×2): 40 mg via INTRAVENOUS
  Filled 2015-07-08 (×2): qty 40

## 2015-07-08 MED FILL — Medication: Qty: 1 | Status: AC

## 2015-07-08 SURGICAL SUPPLY — 36 items
ARMBAND PINK RESTRICT EXTREMIT (MISCELLANEOUS) ×3 IMPLANT
CANISTER SUCTION 2500CC (MISCELLANEOUS) ×3 IMPLANT
CANNULA VESSEL 3MM 2 BLNT TIP (CANNULA) ×3 IMPLANT
CLIP TI MEDIUM 6 (CLIP) ×3 IMPLANT
CLIP TI WIDE RED SMALL 6 (CLIP) ×3 IMPLANT
COVER PROBE W GEL 5X96 (DRAPES) IMPLANT
DECANTER SPIKE VIAL GLASS SM (MISCELLANEOUS) ×3 IMPLANT
ELECT REM PT RETURN 9FT ADLT (ELECTROSURGICAL) ×3
ELECTRODE REM PT RTRN 9FT ADLT (ELECTROSURGICAL) ×1 IMPLANT
GLOVE BIO SURGEON STRL SZ7 (GLOVE) ×3 IMPLANT
GLOVE BIO SURGEON STRL SZ7.5 (GLOVE) ×3 IMPLANT
GLOVE BIOGEL PI IND STRL 6.5 (GLOVE) ×3 IMPLANT
GLOVE BIOGEL PI IND STRL 7.0 (GLOVE) ×2 IMPLANT
GLOVE BIOGEL PI IND STRL 7.5 (GLOVE) ×1 IMPLANT
GLOVE BIOGEL PI IND STRL 8 (GLOVE) ×2 IMPLANT
GLOVE BIOGEL PI INDICATOR 6.5 (GLOVE) ×6
GLOVE BIOGEL PI INDICATOR 7.0 (GLOVE) ×4
GLOVE BIOGEL PI INDICATOR 7.5 (GLOVE) ×2
GLOVE BIOGEL PI INDICATOR 8 (GLOVE) ×4
GLOVE ECLIPSE 6.5 STRL STRAW (GLOVE) ×9 IMPLANT
GLOVE ECLIPSE 7.0 STRL STRAW (GLOVE) ×3 IMPLANT
GOWN STRL REUS W/ TWL LRG LVL3 (GOWN DISPOSABLE) ×6 IMPLANT
GOWN STRL REUS W/TWL LRG LVL3 (GOWN DISPOSABLE) ×12
KIT BASIN OR (CUSTOM PROCEDURE TRAY) ×3 IMPLANT
KIT ROOM TURNOVER OR (KITS) ×3 IMPLANT
LIQUID BAND (GAUZE/BANDAGES/DRESSINGS) ×3 IMPLANT
NS IRRIG 1000ML POUR BTL (IV SOLUTION) ×3 IMPLANT
PACK CV ACCESS (CUSTOM PROCEDURE TRAY) ×3 IMPLANT
PAD ARMBOARD 7.5X6 YLW CONV (MISCELLANEOUS) ×6 IMPLANT
SPONGE SURGIFOAM ABS GEL 100 (HEMOSTASIS) IMPLANT
SUT PROLENE 6 0 BV (SUTURE) ×6 IMPLANT
SUT VIC AB 3-0 SH 27 (SUTURE) ×2
SUT VIC AB 3-0 SH 27X BRD (SUTURE) ×1 IMPLANT
SUT VICRYL 4-0 PS2 18IN ABS (SUTURE) ×3 IMPLANT
UNDERPAD 30X30 INCONTINENT (UNDERPADS AND DIAPERS) ×3 IMPLANT
WATER STERILE IRR 1000ML POUR (IV SOLUTION) ×3 IMPLANT

## 2015-07-08 NOTE — H&P (View-Only) (Signed)
[facility] [place]  Patient name: Kimberly [name] MRN: [id number] DOB: 11/02/1955 Sex: female  REASON FOR CONSULT: to evaluate for hemodialysis access. Referred by Dr. Kathrene Bongo.  HPI: Kimberly [name] is a 59 y.o. female, who is who is referred for evaluation for hemodialysis access. She has a functioning dialysis catheter and dialyzes on Monday Wednesdays and Fridays. Her end-stage renal disease is secondary to diabetes.  She does admit to some dyspnea on exertion and has a history of congestive heart failure although no recent exacerbation of symptoms. She has a long-standing history of chest pain however this has been chronic.  I have reviewed the records from [facility]. She has a history of diastolic right-sided heart failure, COPD, and carotid disease. She's had previous carotid endarterectomy and redo carotid endarterectomy by Dr. Hart Rochester. She also has hypertension, diabetes, coronary artery disease, and morbid obesity. GFR is 18. This was on 06/10/2015.  Past Medical History  Diagnosis Date  . HYPERLIPIDEMIA   . HYPERTENSION   . CAD, NATIVE VESSEL     May 10, 2010 cath showed a hyperdynamic LV function, she had dominant circumflex anatomy with a 70-80% small OM1. She had diffuse diabetic plaque particularly in the distal LAD. She nondominant RCA.  Nondominant  . PVD     CEA  . COPD   . Edema   . CHF (congestive heart failure) (HCC)     Preserved EF  . Cellulitis 10/15/2013    BILATERAL  . Diabetic neuropathy (HCC)   . Anasarca 05/2014  . Type II diabetes mellitus (HCC)   . Proliferative retinopathy     hx/notes 01/27/2010  . Peripheral neuropathy (HCC)     hx/notes 01/27/2010  . Complication of anesthesia     "they had trouble reviving me after the latest carotid OR" (04/06/2015)  . Asthma   . On home oxygen therapy     "2.5L; 24/7" (04/06/2015)  . Pneumonia "several times"  . Chronic bronchitis (HCC)     "often; usually q yr"  (04/06/2015)  . Hypothyroidism   . History of hiatal hernia   . GERD (gastroesophageal reflux disease)   . Stroke 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 Arthritis     "aches and pains all over" (04/06/2015)  . Anxiety   . Depression   . Chronic kidney disease     "they've had me on dialysis periodically" (04/06/2015)  . Paralyzed vocal cords   . Heart attack [facility] 2011    "on the table when I had my last carotid OR" (04/06/2015)  . Shortness of breath dyspnea     with exertion  . Sleep apnea     not on cpap    . Anemia   . Enlarged liver   . Constipation     Family History  Problem Relation Age of Onset  . Cancer Mother     Breast, NHL  . Stroke Mother   . Peripheral vascular disease Father   . CAD Father 43  . Heart attack Father   . Hypertension Father   . Asthma Father   . Heart disease Father     before age 30    SOCIAL HISTORY: Social History   Social History  . Marital Status: Married    Spouse Name: N/A  . Number of Children: 0  . Years  of Education: N/A   Occupational History  . disabled    Social History Main Topics  . Smoking status: Former Smoker -- 1.00 packs/day for 20 years    Types: Cigarettes    Quit date: 09/11/2005  . Smokeless tobacco: Never Used  . Alcohol Use: No  . Drug Use: No  . Sexual Activity: No   Other Topics Concern  . Not on file   Social History Narrative    Allergies  Allergen Reactions  . Ciprofloxacin Itching  . Epinephrine     Increased heart rate    Current Outpatient Prescriptions  Medication Sig Dispense Refill  . acetaminophen (TYLENOL) 325 MG tablet Take 650 mg by mouth every 6 (six) hours as needed for mild pain.    Marland Kitchen albuterol (PROVENTIL HFA;VENTOLIN HFA) 108 (90 BASE) MCG/ACT inhaler Inhale 1-2 puffs into the lungs every 6 (six) hours as needed for wheezing or shortness of breath.    Marland Kitchen aspirin  EC 81 MG tablet Take 81 mg by mouth daily.    . insulin aspart (NOVOLOG) 100 UNIT/ML injection Inject 6 Units into the skin 3 (three) times daily with meals.    . insulin glargine (LANTUS) 100 UNIT/ML injection Inject 60 Units into the skin 2 (two) times daily.     Marland Kitchen levothyroxine (SYNTHROID, LEVOTHROID) 75 MCG tablet Take 75 mcg by mouth daily before breakfast.    . metoprolol succinate (TOPROL-XL) 50 MG 24 hr tablet Take 1 tablet (50 mg total) by mouth daily. Take 1 and 1/2 tabs daily = 75 mg daily (Patient taking differently: Take 50 mg by mouth daily. ) 30 tablet 1  . Multiple Vitamins-Minerals (MULTIVITAMIN PO) Take 1 tablet by mouth daily.    Marland Kitchen oxyCODONE-acetaminophen (PERCOCET/ROXICET) 5-325 MG tablet Take 1 tablet by mouth every 6 (six) hours as needed. 15 tablet 0  . metolazone (ZAROXOLYN) 10 MG tablet Take 10 mg by mouth daily.    . potassium chloride SA (KLOR-CON M20) 20 MEQ tablet Take 2 tablets (40 mEq total) by mouth 2 (two) times daily. (Patient not taking: Reported on 07/07/2015) 360 tablet 3  . torsemide (DEMADEX) 100 MG tablet Take 100 mg by mouth 2 (two) times daily.     No current facility-administered medications for this visit.    REVIEW OF SYSTEMS:   denotes positive finding,  denotes negative finding Cardiac  Comments:  Chest pain or chest pressure: X Chronic. No acute changes.  Shortness of breath upon exertion: X   Short of breath when lying flat: X   Irregular heart rhythm:        Vascular    Pain in calf, thigh, or hip brought on by ambulation:    Pain in feet at night that wakes you up from your sleep:     Blood clot in your veins:    Leg swelling:         Pulmonary    Oxygen at home: X   Productive cough:     Wheezing:  X       Neurologic    Sudden weakness in arms or legs:     Sudden numbness in arms or legs:     Sudden onset of difficulty speaking or slurred speech:    Temporary loss of vision in one eye:     Problems with dizziness:          Gastrointestinal    Blood in stool:     Vomited blood:  Genitourinary    Burning when urinating:     Blood in urine:        Psychiatric    Major depression:         Hematologic    Bleeding problems:    Problems with blood clotting too easily:        Skin    Rashes or ulcers:        Constitutional    Fever or chills:      PHYSICAL EXAM: Filed Vitals:   07/07/15 0920  BP: 125/59  Pulse: 77  Height: 5\' 4"  (1.626 m)  Weight: 323 lb (146.512 kg)  SpO2: 96%    GENERAL: The patient is a well-nourished female, in no acute distress. The vital signs are documented above. CARDIAC: There is a regular rate and rhythm.  VASCULAR: she has a regular rate and rhythm. She has a palpable right radial pulse and a diminished left radial pulse. PULMONARY: There is good air exchange bilaterally without wheezing or rales. ABDOMEN: Soft and non-tender with normal pitched bowel sounds.  MUSCULOSKELETAL: There are no major deformities or cyanosis. NEUROLOGIC: No focal weakness or paresthesias are detected. SKIN: There are no ulcers or rashes noted. PSYCHIATRIC: The patient has a normal affect.  DATA:  UPPER EXTREMITY VEIN MAP: I have independently interpreted her upper extremity vein map. On the left side the forearm cephalic vein looks small and the upper arm cephalic vein cannot be visualized.   On the right side, the forearm cephalic vein looks small. The upper arm cephalic vein is marginal but might potentially be usable for fistula. The basilic vein on the right looks small.  UPPER EXTREMITY ARTERIAL DUPLEX: I have independently interpreted her upper extremity arterial duplex which shows biphasic Doppler signals in the radial and ulnar positions bilaterally.  MEDICAL ISSUES:  STAGE V CHRONIC KIDNEY DISEASE: I have recommended that we place access in the right arm. She has a tunneled dialysis catheter on the left which could potentially obstruct flow. In addition her veins on the  left appear to be smaller. In addition, she's had previous injuries to the left arm and was told in the past to not take blood pressures not arm as they were concerned about the circulation. I have recommended placement of a upper arm brachiocephalic fistula if possible. If this is not possible and she could potentially have a basilic vein transposition. If neither were possible she would require placement of an AV graft. I have explained the indications for placement of an AV fistula or AV graft. I've explained that if at all possible we will place an AV fistula.  I have reviewed the risks of placement of an AV fistula including but not limited to: failure of the fistula to mature, need for subsequent interventions, and thrombosis. In addition I have reviewed the potential complications of placement of an AV graft. These risks include, but are not limited to, graft thrombosis, graft infection, wound healing problems, bleeding, arm swelling, and steal syndrome. All the patient's questions were answered and they are agreeable to proceed with surgery. We have been asked to schedule at this this week and I have scheduled it for tomorrow.   Waverly Ferrariickson, Tylan Kinn [facility] [place] Beeper: [phone]

## 2015-07-08 NOTE — Progress Notes (Signed)
Pt spontaneously aroused around 1800. Pt awake making eye contact with staff, shaking head appropriately to answer questions. Able to move all extremities and follows commands. Notified E-link MD of pts mental status and OK with keeping pt on 85M. VSS. Family aware. Nursing to continue to monitor.   Approx 1830 Portable Head CT to bedside to complete Head CT.   Approx 1910 Call from CT staff reporting head CT was incomplete and that pt size prohibits repeating portable scan. Notified oncoming nurse.

## 2015-07-08 NOTE — Op Note (Signed)
    NAME: Kimberly [name]   MRN: [id number] DOB: 03-05-1956    DATE OF OPERATION: 07/08/2015  PREOP DIAGNOSIS: Stage 5 CKD  POSTOP DIAGNOSIS: Same and intraop cardiac arrest  PROCEDURE: Exploration of right brachial artery and right axillary vein  SURGEON: Di Kindlehristopher S. Edilia Boickson, MD, FACS  ASSIST: Cyndy FreezeMaurine Collins, PA  ANESTHESIA: General   EBL: minimal  INDICATIONS: Kimberly [name] is a 59 y.o. female who dialyses on MWF. She presented for elective placement of access in the right arm.   FINDINGS: Very small brachial artery and axillary vein. Patient had a cardiac arrest intraoperatively, therefore I did NOT proceed with placement of an AVG  TECHNIQUE: The patient was taken to the OR and was sedated. The right arm was prepped and draped in sterile fashion. The skin was anesthetized with 1% lido. A longitudinal incision was made just above the antecubital level. The brachial artery and adjacent veins were exposed they were very small. There was some concern with her breathing, so anesthesia proceeded with general anesthesia and secured the airway. I then explored the axillary vein in the axilla through a longitudinal incision. It was also small. Anesthesia was then concerned about the BP. Given the concerns with the blood pressure and the small vessels, I elected to close the wounds and not proceed with the AVG. Hemostasis was obtained. The wounds were closed with a deep layer of 3-0 vicryl and the skin was closed with 4-0 vicryl. Prior to transport, the patient arrested. She was successfully resuscitated by anesthesia and transported to the ICU in critical condition.    Waverly Ferrarihristopher Noal Abshier, MD, FACS [facility] [place] [facility]  DATE OF DICTATION:   07/08/2015

## 2015-07-08 NOTE — Progress Notes (Signed)
Notified MD Wert in regards to pt's WBC being elevated at 1631. Also, pt's portable CT of head was inconclusive. However, pt is now following commands. MD Wert ordered not to get another CT scan of head since patient is following commands. Will continue to monitor and assess.

## 2015-07-08 NOTE — Procedures (Signed)
Central Venous Catheter Insertion Procedure Note Kimberly [name] [id number] 1956/03/19  Procedure: Insertion of Central Venous Catheter Indications: Assessment of intravascular volume, Drug and/or fluid administration and Frequent blood sampling  Procedure Details Consent: Unable to obtain consent because of emergent medical necessity. Time Out: Verified patient identification, verified procedure, site/side was marked, verified correct patient position, special equipment/implants available, medications/allergies/relevent history reviewed, required imaging and test results available.  Performed  Maximum sterile technique was used including antiseptics, cap, gloves, gown, hand hygiene, mask and sheet. Skin prep: Chlorhexidine; local anesthetic administered A antimicrobial bonded/coated triple lumen catheter was placed in the right internal jugular vein using the Seldinger technique.  Evaluation Blood flow good Complications: No apparent complications Patient did tolerate procedure well. Chest X-ray ordered to verify placement.  CXR: pending.  Procedure performed by Kimberly DressKaty Whiteheart, NP under my direct supervision.  U/S used in placement.  Kimberly [name], M.D. [facility] Pulmonary/Critical Care Medicine. Pager: [phone]. After hours pager: [phone].  Kimberly [name] 07/08/2015, 4:44 PM

## 2015-07-08 NOTE — H&P (Signed)
PULMONARY / CRITICAL CARE MEDICINE   Name: Kimberly [name] MRN: [id number] DOB: [birth year]    ADMISSION DATE:  07/08/2015 CONSULTATION DATE:  07/08/2015  REFERRING MD :  Vascular Surgery   CHIEF COMPLAINT:  Post Cardiac Arrest   INITIAL PRESENTATION: 34 yof with a PMH of HTN, HLD, CAD, COPD, CHF, DM, OSA, and ESRD secondary to DM (dialyzes MWF) presented to [facility] for a right arm AV fistula creation. Pt was undergoing procedure when she suffered from a cardiac arrest 10/27. Estimated down time 4-6 minutes, minimal. Procedure aborted.   STUDIES:  CT head >>   SIGNIFICANT EVENTS: 10/27: Pt with cardiac arrest in OR    HISTORY OF PRESENT ILLNESS:  82 yof with a PMH of HTN, HLD, CAD, COPD, CHF, DM, OSA, and ESRD secondary to DM (dialyzes MWF) presented to [facility] for a right arm AV fistula creation. Pt was undergoing procedure when she suffered from a cardiac arrest. Per anesthesiology, pt was placed under local anesthesia due to her history of "problems coming out of anesthesia" and sedated with propofol. During local anesthesia, pt became hypoxic and went into respiratory arrest. After BVM and placement of LMA, pt subsequently had another respiratory arrest resulting in PEA. CPR was initiated, 2 rounds of epi, and an epi gtt was started. Surgery was stopped and pt transported to [facility]. On presentation, pt was mottled and being bagged by bvm, o2 75%, HR 125, blood pressure 177/110 on epi gtt.   PAST MEDICAL HISTORY :   has a past medical history of HYPERLIPIDEMIA; HYPERTENSION; CAD, NATIVE VESSEL; PVD; COPD; Edema; CHF (congestive heart failure) (HCC); Cellulitis (10/15/2013); Diabetic neuropathy (HCC); Anasarca (05/2014); Type II diabetes mellitus (HCC); Proliferative retinopathy; Peripheral neuropathy (HCC); Complication of anesthesia; Asthma; On home oxygen therapy; Pneumonia ("several times"); Chronic bronchitis (HCC); Hypothyroidism; History of hiatal hernia; GERD (gastroesophageal reflux disease);  Stroke [facility]); Arthritis; Anxiety; Depression; Paralyzed vocal cords; Heart attack (HCC) (2011); Shortness of breath dyspnea; Sleep apnea; Anemia; Enlarged liver; Constipation; and Chronic kidney disease.  has past surgical history that includes Carotid endarterectomy (Left, X 2); Vitrectomy (Bilateral); Cataract extraction w/ intraocular lens  implant, bilateral (Bilateral); Eye surgery; Colonoscopy; and Insertion of dialysis catheter (N/A, 06/24/2015). Prior to Admission medications   Medication Sig Start Date End Date Taking? Authorizing Provider  aspirin EC 81 MG tablet Take 81 mg by mouth daily.   Yes Historical Provider, MD  insulin aspart (NOVOLOG) 100 UNIT/ML injection Inject 6 Units into the skin 3 (three) times daily with meals. 07/05/14  Yes Zannie Cove, MD  insulin glargine (LANTUS) 100 UNIT/ML injection Inject 50 Units into the skin 2 (two) times daily.    Yes Historical Provider, MD  levothyroxine (SYNTHROID, LEVOTHROID) 75 MCG tablet Take 75 mcg by mouth daily before breakfast.   Yes Historical Provider, MD  metoprolol succinate (TOPROL-XL) 50 MG 24 hr tablet Take 1 tablet (50 mg total) by mouth daily. Take 1 and 1/2 tabs daily = 75 mg daily Patient taking differently: Take 50 mg by mouth daily.  04/20/15  Yes Catarina Hartshorn, MD  Multiple Vitamins-Minerals (MULTIVITAMIN PO) Take 1 tablet by mouth daily.   Yes Historical Provider, MD  oxyCODONE-acetaminophen (PERCOCET/ROXICET) 5-325 MG tablet Take 1 tablet by mouth every 6 (six) hours as needed. Patient taking differently: Take 1 tablet by mouth every 6 (six) hours as needed for moderate pain or severe pain.  06/24/15  Yes Lars Mage, PA-C  acetaminophen (TYLENOL) 325 MG tablet Take 650 mg by mouth every 6 (  six) hours as needed for mild pain.    Historical Provider, MD  albuterol (PROVENTIL HFA;VENTOLIN HFA) 108 (90 BASE) MCG/ACT inhaler Inhale 1-2 puffs into the lungs every 6 (six) hours as needed for wheezing or shortness of breath.     Historical Provider, MD  potassium chloride SA (KLOR-CON M20) 20 MEQ tablet Take 2 tablets (40 mEq total) by mouth 2 (two) times daily. Patient not taking: Reported on 07/07/2015 09/18/14   Lewayne BuntingBrian S Crenshaw, MD   Allergies  Allergen Reactions  . Ciprofloxacin Itching  . Epinephrine     Increased heart rate    FAMILY HISTORY:  indicated that her mother is deceased. She indicated that her father is deceased.  SOCIAL HISTORY:  reports that she quit smoking about 9 years ago. Her smoking use included Cigarettes. She has a 20 pack-year smoking history. She has never used smokeless tobacco. She reports that she does not drink alcohol or use illicit drugs.  REVIEW OF SYSTEMS:   Unable, intubated and sedated/paralyzed   SUBJECTIVE:  Mottled, intubated and being bagged by bvm.   VITAL SIGNS: Temp:  [97.5 F (36.4 C)] 97.5 F (36.4 C) (10/27 0906) Pulse Rate:  [72] 72 (10/27 0906) Resp:  [16] 16 (10/27 0906) BP: (127)/(86) 127/86 mmHg (10/27 0906) SpO2:  [100 %] 100 % (10/27 0906) Weight:  [146 kg (321 lb 14 oz)] 146 kg (321 lb 14 oz) (10/27 0906) HEMODYNAMICS:   VENTILATOR SETTINGS:   INTAKE / OUTPUT: No intake or output data in the 24 hours ending 07/08/15 1513  PHYSICAL EXAMINATION: General:  Chronically ill appearing female, orally intubated, in acute distress  Neuro:  Sedated/paralyzed, no focal def.  HEENT:  NCAT, MM dry  Cardiovascular:  ST, S1/S2  Lungs: Crackles bilaterally Abdomen:  Non-distended, non-tender, +bs  Musculoskeletal:  Intact  Skin:  Cool, mottled.   LABS:  CBC  Recent Labs Lab 07/08/15 1037  HGB 12.6  HCT 37.0   Coag's No results for input(s): APTT, INR in the last 168 hours. BMET  Recent Labs Lab 07/08/15 1037  NA 133*  K 3.5  GLUCOSE 188*   Electrolytes No results for input(s): CALCIUM, MG, PHOS in the last 168 hours. Sepsis Markers No results for input(s): LATICACIDVEN, PROCALCITON, O2SATVEN in the last 168 hours. ABG No results  for input(s): PHART, PCO2ART, PO2ART in the last 168 hours. Liver Enzymes No results for input(s): AST, ALT, ALKPHOS, BILITOT, ALBUMIN in the last 168 hours. Cardiac Enzymes No results for input(s): TROPONINI, PROBNP in the last 168 hours. Glucose  Recent Labs Lab 07/08/15 0903 07/08/15 1255  GLUCAP 172* 173*    Imaging No results found.   ASSESSMENT / PLAN:  PULMONARY OETT 10/27 >>  A: Acute hypoxic respiratory failure in the setting of cardiopulmonary arrest Mild pulmonary edema  Obesity OSA/OHS   Suspect secondary to residual anesthesia superimposed on underlying obesity hypoventilation and metabolic derangements  P:   Full vent support  VAP bundle  PRN BDs  Wean FiO2 to sat >92%   CARDIOVASCULAR CVL 10/27>> A:  S/p cardiac arrest--PEA; expected down time 4-6 minutes  H/o LBBB H/o HTN  H/o CHF  Now off epinephrine  P: Tele  CVP monitoring   Troponin Q6 x3  Hold home anti-hypertensives    RENAL A:  ESRD on dialysis --> last dialysis 10/26  Metabolic acidosis  P:   Will need HD tomorrow  Renal dose meds  F/u bmet   GASTROINTESTINAL A:   H/o GERD  P:  Protonix   HEMATOLOGIC A:   No acute issues P:  DVT prophylaxis with subQ heparin F/u CBC   INFECTIOUS A:   No acute  P:   Monitor WBC/fever curve   ENDOCRINE A:   H/o DM  H/o hypothyroidism  P:   CBGs and SSI  IV levothyroxine   NEUROLOGIC A:  S/p Cardiac arrest concern for possible hypoxic encephalopathy  P:   CT Head  Hypothermia protocol IF HEAD CT NEGATIVE FOR BLEED  RASS goal: -1 to -2  PRN fentanyl    FAMILY  - Updates:   - Inter-disciplinary family meet or Palliative Care meeting due by:  11/3     TODAY'S SUMMARY: 22 yof with a PMH of HTN, HLD, CAD, COPD, CHF, DM, OSA, and ESRD secondary to DM (dialyzes MWF) presented to [facility] for a right arm AV fistula creation. Pt was undergoing procedure when she suffered from a cardiac arrest. Pt to remain on full vent  support, CT head, cycle troponin. If CT head negative, will initiate hypothermia protocol.      Simonne Martinet ACNP-BC [facility] Pulmonary/Critical Care Pager # [phone] OR # [phone] if no answer  Attending Note:  59 year old woman with ESRD due to diabetes who is morbidly obese and needed an AV fistula placement for dialysis. The patient had a respiratory followed by a cardiac arrest in the OR and patient was intubated originally with an LMA then followed by an ETT. Patient was brought to the ICU for further management. It is unclear how long the arrest was and whether or not the patient regained consciousness. Unfortunately the patient was paralyzed right before anesthesia left the ICU so she is currently paralyzed and I am not able to assess her mental status to determine if cooling or not is appropriate at this time.   Her lungs are clear on exam but unable to assess neuro status due to paralytics.  EKG looks the same as prior so unlikely to be a primary cardiac event but will cycle troponins. Maintain on full vent support but increase RR as ordered for respiratory compensation of metabolic acidosis. Hold off TF for now. Perform a head CT and if negative then will proceed with hypothermia protocol. Spoke with Dr. Edilia Bo, arrest 6 minutes and patient never regained consciousness so will proceed with hypothermia if head CT negative.  Spoke with husband, explained condition and hypothermia protocol. Full code status for now.  The patient is critically ill with multiple organ systems failure and requires high complexity decision making for assessment and support, frequent evaluation and titration of therapies, application of advanced monitoring technologies and extensive interpretation of multiple databases.   Critical Care Time devoted to patient care services described in this note is 45 Minutes. This time reflects time of care of this signee Dr Koren Bound. This critical care  time does not reflect procedure time, or teaching time or supervisory time of PA/NP/Med student/Med Resident etc but could involve care discussion time.  Alyson Reedy, M.D. [facility] [place] Pulmonary/Critical Care Medicine. Pager: [phone]. After hours pager: [phone].  07/08/2015, 3:13 PM

## 2015-07-08 NOTE — Procedures (Signed)
Arterial Catheter Insertion Procedure Note Kimberly [name] [id number] Kimberly [name]  Procedure: Insertion of Arterial Catheter  Indications: Blood pressure monitoring and Frequent blood sampling  Procedure Details Consent: Unable to obtain consent because of emergent medical necessity. Time Out: Verified patient identification, verified procedure, site/side was marked, verified correct patient position, special equipment/implants available, medications/allergies/relevent history reviewed, required imaging and test results available.  Performed  Maximum sterile technique was used including antiseptics, cap, gloves, gown, hand hygiene, mask and sheet. Skin prep: Chlorhexidine; local anesthetic administered 20 gauge catheter was inserted into right radial artery using the Seldinger technique.  Evaluation Blood flow good; BP tracing good. Complications: No apparent complications.   Koren BoundYACOUB,WESAM 07/08/2015

## 2015-07-08 NOTE — Interval H&P Note (Signed)
History and Physical Interval Note:  07/08/2015 12:52 PM  Kimberly [name]  has presented today for surgery, with the diagnosis of End Stage Renal Disease N18.6  The various methods of treatment have been discussed with the patient and family. After consideration of risks, benefits and other options for treatment, the patient has consented to  Procedure(s): RIGHT ARM ARTERIOVENOUS (AV) FISTULA CREATION vs INSERTION RIGHT ARM ARTERIOVENOUS GRAFT (Right) as a surgical intervention .  The patient's history has been reviewed, patient examined, no change in status, stable for surgery.  I have reviewed the patient's chart and labs.  Questions were answered to the patient's satisfaction.     Waverly Ferrariickson, Ladaisha Portillo

## 2015-07-08 NOTE — Anesthesia Postprocedure Evaluation (Signed)
  Anesthesia Post-op Note  Patient: Kimberly [name]  Procedure(s) Performed: Procedure(s): EXPLORATION RIGHT AXILLARY ARTERY AND RIGHT BRACHIAL VEIN (Right)  Patient Location: ICU  Anesthesia Type:General  Level of Consciousness: unresponsive  Airway and Oxygen Therapy: Patient remains intubated per anesthesia plan  Post-op Pain: Unable to assess  Post-op Assessment: Post-op Vital signs reviewed              Post-op Vital Signs: Reviewed  Last Vitals:  Filed Vitals:   07/08/15 1800  BP: 109/47  Pulse: 86  Temp: 35.7 C  Resp: 24    Complications: Intraop PEA arrest x2, transported to ICU intubated.

## 2015-07-08 NOTE — Transfer of Care (Addendum)
Immediate Anesthesia Transfer of Care Note  Patient: Kimberly [name]  Procedure(s) Performed: Procedure(s): EXPLORATION RIGHT AXILLARY ARTERY AND RIGHT BRACHIAL VEIN (Right)  Patient Location: ICU  Anesthesia Type:General  Level of Consciousness: Patient remains intubated per anesthesia plan  Airway & Oxygen Therapy: Patient remains intubated per anesthesia plan and Patient placed on Ventilator (see vital sign flow sheet for setting)  Post-op Assessment: Post -op Vital signs reviewed and unstable, Anesthesiologist notified  Post vital signs: Reviewed and unstable  Last Vitals:  BP 146/100 HR 78 RR -- see respiratory flowsheet for details SpO2 100% per ABG Dr Hart RochesterHollis and Dr Edilia Boickson present throughout transport and handoff to ICU.  Complications: unexpected post-op hospitalization

## 2015-07-08 NOTE — Progress Notes (Signed)
Pt reports stage II ulcer to sacral area, dressing applied.

## 2015-07-08 NOTE — Progress Notes (Signed)
fio2 weaned to 40% and peep weaned to 5 s/p ABG per order Dr Molli KnockYacoub. RN aware, pt tol well. Sat 100%, VSS.

## 2015-07-08 NOTE — Consult Note (Signed)
Reason for Consult: To manage dialysis and dialysis related needs Referring Physician: [name] is an 59 y.o. female with past medical history significant for diabetes mellitus, hypertension, COPD, morbid obesity, diffuse vascular disease including CAD PAD and cerebrovascular disease.  She's also had CKD recently progressed to becoming dialysis dependent mostly on the basis of volume overload. She is dialyzing at [facility] has been dialyzed extra days due to fluid but apparently has a Monday Wednesday Friday schedule. She was admitted today for permanent access placement. Unfortunately, patient suffered a PE a cardiac arrest as well as respiratory failure at the start of the procedure. The procedure was aborted and patient was intubated and is moved to the intensive care unit. She is currently on an epi drip that is being weaned.  Preprocedure labs showed a potassium of 3.5 and hemoglobin of 12.6. More recent labs are pending   Dialyzes at [facility]   EDW 131- just taken down. HD Bath 2K/2 Ca, Dialyzer 180, Heparin yes 6000. Access PermCath  Mircera 75 q 2 weeks- last hgb 10.4- also venofer 100 q tx.  No PTH- calc 9.8, phos 4.5  Past Medical History  Diagnosis Date  . HYPERLIPIDEMIA   . HYPERTENSION   . CAD, NATIVE VESSEL     May 10, 2010 cath showed a hyperdynamic LV function, she had dominant circumflex anatomy with a 70-80% small OM1. She had diffuse diabetic plaque particularly in the distal LAD. She nondominant RCA.  Nondominant  . PVD     CEA  . COPD   . Edema   . CHF (congestive heart failure) (HCC)     Preserved EF  . Cellulitis 10/15/2013    BILATERAL  . Diabetic neuropathy (HCC)   . Anasarca 05/2014  . Type II diabetes mellitus (HCC)   . Proliferative retinopathy     hx/notes 01/27/2010  . Peripheral neuropathy (HCC)     hx/notes 01/27/2010  . Complication of anesthesia     "they had trouble reviving me after the latest carotid OR" (04/06/2015)  . Asthma   . On home  oxygen therapy     "2.5L; 24/7" (04/06/2015)  . Pneumonia "several times"  . Chronic bronchitis (HCC)     "often; usually q yr" (04/06/2015)  . Hypothyroidism   . History of hiatal hernia   . GERD (gastroesophageal reflux disease)   . 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 Arthritis     "aches and pains all over" (04/06/2015)  . Anxiety   . Depression   . Paralyzed vocal cords   . Heart attack [facility]) 2011    "on the table when I had my last carotid OR" (04/06/2015)  . Shortness of breath dyspnea     with exertion  . Sleep apnea     not on cpap    . Anemia   . Enlarged liver   . Constipation   . Chronic kidney disease     dialysis M-W-F    Past Surgical History  Procedure Laterality Date  . Carotid endarterectomy Left X 2  . Vitrectomy Bilateral   . Cataract extraction w/ intraocular lens  implant, bilateral Bilateral   . Eye surgery      numerous surgeries  . Colonoscopy    . Insertion of dialysis catheter N/A 06/24/2015    Procedure: ULTRASOUND BILATERAL INTERNAL JUGULAR VEIN  INSERTION OF DIALYSIS CATHETER LEFT INTERNAL JUGULAR VEIN ;  Surgeon: Pryor OchoaJames D Lawson, MD;  [facility];  Service: Vascular;  Laterality: N/A;    Family History  Problem Relation Age of Onset  . Cancer Mother     Breast, NHL  . Stroke Mother   . Peripheral vascular disease Father   . CAD Father 5230  . Heart attack Father   . Hypertension Father   . Asthma Father   . Heart disease Father     before age 60    Social History:  reports that she quit smoking about 9 years ago. Her smoking use included Cigarettes. She has a 20 pack-year smoking history. She has never used smokeless tobacco. She reports that she does not drink alcohol or use illicit drugs.  Allergies:  Allergies  Allergen Reactions  . Ciprofloxacin Itching  . Epinephrine     Increased heart rate     Medications: I have reviewed the patient's current medications.   Results for orders placed or performed during the hospital encounter of 07/08/15 (from the past 48 hour(s))  Glucose, capillary     Status: Abnormal   Collection Time: 07/08/15  9:03 AM  Result Value Ref Range   Glucose-Capillary 172 (H) 65 - 99 mg/dL  I-STAT 4, (NA,K, GLUC, HGB,HCT)     Status: Abnormal   Collection Time: 07/08/15 10:37 AM  Result Value Ref Range   Sodium 133 (L) 135 - 145 mmol/L   Potassium 3.5 3.5 - 5.1 mmol/L   Glucose, Bld 188 (H) 65 - 99 mg/dL   HCT 19.137.0 47.836.0 - 29.546.0 %   Hemoglobin 12.6 12.0 - 15.0 g/dL  Glucose, capillary     Status: Abnormal   Collection Time: 07/08/15 12:55 PM  Result Value Ref Range   Glucose-Capillary 173 (H) 65 - 99 mg/dL  I-STAT 3, arterial blood gas (G3+)     Status: Abnormal   Collection Time: 07/08/15  3:43 PM  Result Value Ref Range   pH, Arterial 7.285 (L) 7.350 - 7.450   pCO2 arterial 36.6 35.0 - 45.0 mmHg   pO2, Arterial 428.0 (H) 80.0 - 100.0 mmHg   Bicarbonate 17.4 (L) 20.0 - 24.0 mEq/L   TCO2 18 0 - 100 mmol/L   O2 Saturation 100.0 %   Acid-base deficit 9.0 (H) 0.0 - 2.0 mmol/L   Patient temperature HIDE    Sample type ARTERIAL   Glucose, capillary     Status: Abnormal   Collection Time: 07/08/15  3:48 PM  Result Value Ref Range   Glucose-Capillary 271 (H) 65 - 99 mg/dL    Dg Chest Port 1 View  07/08/2015  CLINICAL DATA:  Post cardiac arrest.  Intubation. EXAM: PORTABLE CHEST 1 VIEW COMPARISON:  06/24/2015 FINDINGS: Endotracheal tube is in place with tip in the lower trachea estimated to be approximately 4.7 cm above the carina. Patient has a left internal jugular central venous line, tip overlying the level of the superior vena cava at its confluence with the brachiocephalic vein. Heart is enlarged. There is mild pulmonary vascular congestion. No overt edema identified. No acute fractures or obvious mass pneumothorax. IMPRESSION: 1. Interval  intubation. 2. Mild pulmonary vascular congestion. Electronically Signed   By: Norva PavlovElizabeth  Brown M.D.   On: 07/08/2015 16:02    ROS: Unable to be obtained as patient is intubated  Blood pressure 120/86, pulse 88, temperature 97.5 F (36.4 C), temperature source Oral, resp. rate 24, height 5\' 4"  (1.626 m), weight 146 kg (  321 lb 14 oz), SpO2 100 %. Gen.: Patient is intubated in the intensive care unit on epinephrine drip. HEENT pupils are reactive mucous members are moist , there does not seem to be significant jugular venous distention. Lungs: Mechanical ventilation with mostly clear sounds bilaterally. Cardiovascular regular rate and rhythm. Abdomen obese, soft, nontender extremities - chronic woody edema but also with some pitting edema peripherally. PermCath in place   Assessment/Plan: 59 year old white female with multiple medical issues now is status post a respiratory and PE a cardiac arrest in the setting of attempted permanent access placement. 1 Status post respiratory/ cardiac arrest- patient resuscitated. Estimated to be down less than 5 minutes. Now the intensive care unit intubated under the care of critical care medicine 2 ESRD: Her schedule is Monday Wednesday Friday via PermCath. She has been dialyzed extra days due to volume overload recently. Preop her potassium was in the threes. She does not seem to be significantly volume overloaded at this time. Awaiting labs to see if dialysis is required  this evening. Otherwise, we will reassess in a.m. 3 Hypertension: Is not an issue at this time on pressors 4. Anemia of ESRD: Preop hemoglobin was greater than 12 5. Metabolic Bone Disease: No PTH at outpatient dialysis records at this time. Also unable to take by mouth meds at this time- last phos 4.5.    Erinne Gillentine A 07/08/2015, 4:35 PM

## 2015-07-08 NOTE — Anesthesia Preprocedure Evaluation (Addendum)
Anesthesia Evaluation  Patient identified by MRN, date of birth, ID band Patient awake    Reviewed: Allergy & Precautions, NPO status , Patient's Chart, lab work & pertinent test results, reviewed documented beta blocker date and time   Airway Mallampati: III  TM Distance: >3 FB Neck ROM: Full    Dental  (+) Teeth Intact   Pulmonary asthma , sleep apnea , COPD, former smoker,    breath sounds clear to auscultation       Cardiovascular hypertension, Pt. on medications and Pt. on home beta blockers + CAD, + Past MI, + Peripheral Vascular Disease and +CHF   Rhythm:Regular Rate:Normal     Neuro/Psych PSYCHIATRIC DISORDERS Anxiety Depression  Neuromuscular disease CVA    GI/Hepatic GERD  ,  Endo/Other  diabetes, Type 1, Insulin DependentHypothyroidism   Renal/GU   negative genitourinary   Musculoskeletal  (+) Arthritis , Osteoarthritis,    Abdominal   Peds negative pediatric ROS (+)  Hematology negative hematology ROS (+)   Anesthesia Other Findings   Reproductive/Obstetrics negative OB ROS                           Lab Results  Component Value Date   WBC 9.2 04/16/2015   HGB 12.6 07/08/2015   HCT 37.0 07/08/2015   MCV 94.1 04/16/2015   PLT 187 04/16/2015   Lab Results  Component Value Date   CREATININE 3.55* 04/20/2015   BUN 80* 04/20/2015   NA 133* 07/08/2015   K 3.5 07/08/2015   CL 95* 04/20/2015   CO2 30 04/20/2015   Lab Results  Component Value Date   INR 1.19 09/15/2012   INR 0.96 05/03/2010   EKG: sinus tachycardia.  Echo (04/2015): Left ventricle: The cavity size was normal. Wall thickness was normal. Systolic function was normal. The estimated ejection fraction was in the range of 60% to 65%. Wall motion was normal; there were no regional wall motion abnormalities. Features are consistent with a pseudonormal left ventricular filling pattern, with concomitant  abnormal relaxation and increased filling pressure (grade 2 diastolic dysfunction). - Ventricular septum: The interventricular septum has a D-shape, suggestive of RV pressure/volume overload. - Aortic valve: Poorly visualized. Probably trileaflet; mildly calcified leaflets. There was no stenosis. - Mitral valve: Moderately to severely calcified annulus. Minimal to mild mitral stenosis. There was no significant regurgitation. Mean gradient (D): 6 mm Hg. Valve area by pressure half-time: 2.28 cm^2. - Right ventricle: Poorly visualized. The cavity size was mildly dilated. Systolic function was mildly to moderately reduced. - Right atrium: Poorly visualized. - Tricuspid valve: Peak RV-RA gradient (S): 83 mm Hg. - Pulmonary arteries: PA peak pressure: 91 mm Hg (S). - Systemic veins: IVC measured 2.0 cm with < 50% respirophasic variation, suggesting RA pressure 8 mmHg. - Pericardium, extracardiac: A trivial pericardial effusion was identified posterior to the heart.  Anesthesia Physical Anesthesia Plan  ASA: III  Anesthesia Plan: MAC   Post-op Pain Management:    Induction: Intravenous  Airway Management Planned: Natural Airway and Nasal Cannula  Additional Equipment:   Intra-op Plan:   Post-operative Plan:   Informed Consent: I have reviewed the patients History and Physical, chart, labs and discussed the procedure including the risks, benefits and alternatives for the proposed anesthesia with the patient or authorized representative who has indicated his/her understanding and acceptance.     Plan Discussed with: CRNA  Anesthesia Plan Comments:         Anesthesia  Quick Evaluation

## 2015-07-08 NOTE — Anesthesia Procedure Notes (Addendum)
Procedure Name: MAC Date/Time: 07/08/2015 1:24 PM Performed by: Roney MansSMITH, Adaia Matthies P Pre-anesthesia Checklist: Patient identified, Timeout performed, Emergency Drugs available, Suction available and Patient being monitored Patient Re-evaluated:Patient Re-evaluated prior to inductionOxygen Delivery Method: Nasal cannula Intubation Type: IV induction Dental Injury: Teeth and Oropharynx as per pre-operative assessment     Procedure Name: LMA Insertion Date/Time: 07/08/2015 1:44 PM Performed by: Roney MansSMITH, Shirl Weir P Pre-anesthesia Checklist: Patient identified, Timeout performed, Emergency Drugs available, Suction available and Patient being monitored Patient Re-evaluated:Patient Re-evaluated prior to inductionOxygen Delivery Method: Circle system utilized Preoxygenation: Pre-oxygenation with 100% oxygen Intubation Type: IV induction Ventilation: Mask ventilation without difficulty LMA: LMA inserted LMA Size: 4.0 Number of attempts: 1 Placement Confirmation: positive ETCO2 and breath sounds checked- equal and bilateral Tube secured with: Tape Dental Injury: Teeth and Oropharynx as per pre-operative assessment     Procedure Name: Intubation Date/Time: 07/08/2015 2:11 PM Performed by: Roney MansSMITH, Telisa Ohlsen P Pre-anesthesia Checklist: Patient identified, Timeout performed, Emergency Drugs available, Suction available and Patient being monitored Patient Re-evaluated:Patient Re-evaluated prior to inductionOxygen Delivery Method: Circle system utilized Preoxygenation: Pre-oxygenation with 100% oxygen Intubation Type: IV induction Ventilation: Mask ventilation with difficulty, Oral airway inserted - appropriate to patient size and Nasal airway inserted- appropriate to patient size Laryngoscope size: Small adult glidescope. Grade View: Grade I Tube type: Oral Tube size: 7.5 mm Number of attempts: 1 Airway Equipment and Method: Rigid stylet Placement Confirmation: ETT inserted through vocal cords  under direct vision,  breath sounds checked- equal and bilateral and positive ETCO2 Secured at: 23 cm Tube secured with: Tape Dental Injury: Teeth and Oropharynx as per pre-operative assessment     Procedure Name: Intubation Date/Time: 07/08/2015 3:05 PM Performed by: Roney MansSMITH, Tommie Dejoseph P Pre-anesthesia Checklist: Patient identified, Emergency Drugs available, Suction available, Patient being monitored and Timeout performed Patient Re-evaluated:Patient Re-evaluated prior to inductionOxygen Delivery Method: Circle system utilized Preoxygenation: Pre-oxygenation with 100% oxygen Intubation Type: IV induction Laryngoscope size: Small adult glidescope. Tube type: Subglottic suction tube Tube size: 7.5 mm Number of attempts: 1 Placement Confirmation: ETT inserted through vocal cords under direct vision,  positive ETCO2 and breath sounds checked- equal and bilateral Secured at: 23 cm Tube secured with: Tape Dental Injury: Injury to tongue  Future Recommendations: Recommend- induction with short-acting agent, and alternative techniques readily available Comments: ETT exchanged using Cook catheter and Glidescope.

## 2015-07-09 ENCOUNTER — Encounter (HOSPITAL_COMMUNITY): Payer: Self-pay | Admitting: Vascular Surgery

## 2015-07-09 ENCOUNTER — Inpatient Hospital Stay (HOSPITAL_COMMUNITY): Payer: Managed Care, Other (non HMO)

## 2015-07-09 DIAGNOSIS — N179 Acute kidney failure, unspecified: Secondary | ICD-10-CM

## 2015-07-09 DIAGNOSIS — R079 Chest pain, unspecified: Secondary | ICD-10-CM

## 2015-07-09 DIAGNOSIS — N189 Chronic kidney disease, unspecified: Secondary | ICD-10-CM

## 2015-07-09 LAB — POCT I-STAT EG7
Acid-base deficit: 13 mmol/L — ABNORMAL HIGH (ref 0.0–2.0)
BICARBONATE: 18.5 meq/L — AB (ref 20.0–24.0)
Calcium, Ion: 1.06 mmol/L — ABNORMAL LOW (ref 1.13–1.30)
HCT: 37 % (ref 36.0–46.0)
Hemoglobin: 12.6 g/dL (ref 12.0–15.0)
O2 Saturation: 88 %
PCO2 VEN: 64.7 mmHg — AB (ref 45.0–50.0)
PO2 VEN: 74 mmHg — AB (ref 30.0–45.0)
Potassium: 4.9 mmol/L (ref 3.5–5.1)
Sodium: 131 mmol/L — ABNORMAL LOW (ref 135–145)
TCO2: 21 mmol/L (ref 0–100)
pH, Ven: 7.058 — CL (ref 7.250–7.300)

## 2015-07-09 LAB — COMPREHENSIVE METABOLIC PANEL
ALBUMIN: 2.6 g/dL — AB (ref 3.5–5.0)
ALT: 35 U/L (ref 14–54)
AST: 53 U/L — AB (ref 15–41)
Alkaline Phosphatase: 119 U/L (ref 38–126)
Anion gap: 12 (ref 5–15)
BILIRUBIN TOTAL: 1.3 mg/dL — AB (ref 0.3–1.2)
BUN: 31 mg/dL — AB (ref 6–20)
CHLORIDE: 95 mmol/L — AB (ref 101–111)
CO2: 23 mmol/L (ref 22–32)
CREATININE: 4 mg/dL — AB (ref 0.44–1.00)
Calcium: 8.5 mg/dL — ABNORMAL LOW (ref 8.9–10.3)
GFR, EST AFRICAN AMERICAN: 13 mL/min — AB (ref >60)
GFR, EST NON AFRICAN AMERICAN: 11 mL/min — AB (ref >60)
Glucose, Bld: 300 mg/dL — ABNORMAL HIGH (ref 65–99)
Potassium: 3.9 mmol/L (ref 3.5–5.1)
SODIUM: 130 mmol/L — AB (ref 135–145)
TOTAL PROTEIN: 6.6 g/dL (ref 6.5–8.1)

## 2015-07-09 LAB — POCT I-STAT 3, ART BLOOD GAS (G3+)
ACID-BASE DEFICIT: 3 mmol/L — AB (ref 0.0–2.0)
Bicarbonate: 21.1 mEq/L (ref 20.0–24.0)
O2 SAT: 100 %
PH ART: 7.44 (ref 7.350–7.450)
PO2 ART: 173 mmHg — AB (ref 80.0–100.0)
TCO2: 22 mmol/L (ref 0–100)
pCO2 arterial: 30.9 mmHg — ABNORMAL LOW (ref 35.0–45.0)

## 2015-07-09 LAB — CBC
HEMATOCRIT: 30.6 % — AB (ref 36.0–46.0)
Hemoglobin: 9.9 g/dL — ABNORMAL LOW (ref 12.0–15.0)
MCH: 31.3 pg (ref 26.0–34.0)
MCHC: 32.4 g/dL (ref 30.0–36.0)
MCV: 96.8 fL (ref 78.0–100.0)
PLATELETS: 124 10*3/uL — AB (ref 150–400)
RBC: 3.16 MIL/uL — ABNORMAL LOW (ref 3.87–5.11)
RDW: 19.1 % — AB (ref 11.5–15.5)
WBC: 10.6 10*3/uL — AB (ref 4.0–10.5)

## 2015-07-09 LAB — TROPONIN I: TROPONIN I: 0.2 ng/mL — AB (ref <0.031)

## 2015-07-09 LAB — GLUCOSE, CAPILLARY
GLUCOSE-CAPILLARY: 266 mg/dL — AB (ref 65–99)
GLUCOSE-CAPILLARY: 249 mg/dL — AB (ref 65–99)
Glucose-Capillary: 276 mg/dL — ABNORMAL HIGH (ref 65–99)

## 2015-07-09 MED ORDER — HEPARIN SODIUM (PORCINE) 5000 UNIT/ML IJ SOLN
5000.0000 [IU] | Freq: Three times a day (TID) | INTRAMUSCULAR | Status: DC
  Administered 2015-07-09 – 2015-07-10 (×4): 5000 [IU] via SUBCUTANEOUS
  Filled 2015-07-09 (×5): qty 1

## 2015-07-09 MED ORDER — INSULIN GLARGINE 100 UNIT/ML ~~LOC~~ SOLN
25.0000 [IU] | Freq: Two times a day (BID) | SUBCUTANEOUS | Status: DC
  Filled 2015-07-09: qty 0.25

## 2015-07-09 MED ORDER — PERFLUTREN LIPID MICROSPHERE
1.0000 mL | INTRAVENOUS | Status: AC | PRN
  Administered 2015-07-09: 2 mL via INTRAVENOUS
  Filled 2015-07-09: qty 10

## 2015-07-09 MED ORDER — INSULIN GLARGINE 100 UNIT/ML ~~LOC~~ SOLN
25.0000 [IU] | Freq: Two times a day (BID) | SUBCUTANEOUS | Status: DC
  Administered 2015-07-09 (×2): 25 [IU] via SUBCUTANEOUS
  Filled 2015-07-09 (×4): qty 0.25

## 2015-07-09 MED ORDER — INSULIN GLARGINE 100 UNIT/ML ~~LOC~~ SOLN
20.0000 [IU] | Freq: Every day | SUBCUTANEOUS | Status: DC
  Filled 2015-07-09: qty 0.2

## 2015-07-09 MED ORDER — MUPIROCIN 2 % EX OINT
1.0000 "application " | TOPICAL_OINTMENT | Freq: Two times a day (BID) | CUTANEOUS | Status: AC
  Administered 2015-07-09 – 2015-07-14 (×10): 1 via NASAL
  Filled 2015-07-09 (×4): qty 22

## 2015-07-09 MED ORDER — DOCUSATE SODIUM 100 MG PO CAPS
100.0000 mg | ORAL_CAPSULE | Freq: Two times a day (BID) | ORAL | Status: DC
  Administered 2015-07-09 – 2015-07-20 (×17): 100 mg via ORAL
  Filled 2015-07-09 (×21): qty 1

## 2015-07-09 MED ORDER — SODIUM CHLORIDE 0.9 % IV SOLN
125.0000 mg | INTRAVENOUS | Status: DC
  Administered 2015-07-09 – 2015-07-16 (×4): 125 mg via INTRAVENOUS
  Filled 2015-07-09 (×9): qty 10

## 2015-07-09 MED ORDER — LEVOTHYROXINE SODIUM 75 MCG PO TABS
75.0000 ug | ORAL_TABLET | Freq: Every day | ORAL | Status: DC
  Administered 2015-07-10 – 2015-07-20 (×10): 75 ug via ORAL
  Filled 2015-07-09 (×13): qty 1

## 2015-07-09 MED ORDER — CHLORHEXIDINE GLUCONATE CLOTH 2 % EX PADS
6.0000 | MEDICATED_PAD | Freq: Every day | CUTANEOUS | Status: AC
Start: 2015-07-10 — End: 2015-07-15
  Administered 2015-07-10 – 2015-07-14 (×3): 6 via TOPICAL

## 2015-07-09 NOTE — Progress Notes (Signed)
  Echocardiogram 2D Echocardiogram has been performed.  Kimberly [name], Kimberly [name] 07/09/2015, 10:31 AM

## 2015-07-09 NOTE — Progress Notes (Signed)
PULMONARY / CRITICAL CARE MEDICINE   Name: Kimberly [name] MRN: [id number] DOB: [birth year]    ADMISSION DATE:  07/08/2015 CONSULTATION DATE:  07/08/15  REFERRING MD :  Vascular surgery  CHIEF COMPLAINT:  Post cardiac arrest  INITIAL PRESENTATION: 59 yof with a PMH of HTN, HLD, CAD, COPD, CHF, DM, OSA, and ESRD secondary to DM (dialyzes MWF) presented to [facility] for a right arm AV fistula creation. Pt was undergoing procedure when she suffered from a cardiac arrest 10/27. Estimated down time 4-6 minutes, minimal. Procedure aborted.   STUDIES:  CT head >> caudal half of brain not visualized, stable with chronic ischemic changes in left hemisphere  SIGNIFICANT EVENTS: 10/27: Pt with cardiac arrest in OR   SUBJECTIVE: reports pain in neck from positioning, calm and awaiting possible extubation  VITAL SIGNS: Temp:  [86 F (30 C)-97.5 F (36.4 C)] 86 F (30 C) (10/28 0700) Pulse Rate:  [72-88] 78 (10/28 0700) Resp:  [16-30] 22 (10/28 0700) BP: (104-164)/(40-86) 145/56 mmHg (10/28 0700) SpO2:  [99 %-100 %] 100 % (10/28 0700) Arterial Line BP: (122-184)/(50-75) 184/75 mmHg (10/28 0700) FiO2 (%):  [40 %-100 %] 40 % (10/28 0600) Weight:  [321 lb 14 oz (146 kg)] 321 lb 14 oz (146 kg) (10/27 0906) HEMODYNAMICS: CVP:  [6 mmHg-23 mmHg] 20 mmHg VENTILATOR SETTINGS: Vent Mode:  [-] PRVC FiO2 (%):  [40 %-100 %] 40 % Set Rate:  [24 bmp] 24 bmp Vt Set:  [440 mL] 440 mL PEEP:  [5 cmH20-10 cmH20] 5 cmH20 Plateau Pressure:  [27 cmH20-33 cmH20] 27 cmH20 INTAKE / OUTPUT:  Intake/Output Summary (Last 24 hours) at 07/09/15 0800 Last data filed at 07/09/15 0700  Gross per 24 hour  Intake   2085 ml  Output    580 ml  Net   1505 ml    PHYSICAL EXAMINATION: General: Chronically ill appearing female, NAD Neuro: Alert, follows commands, moves all extremities  HEENT: NCAT, ETT in place, EOMI, PERRL Cardiovascular: RRR, S1/S2  Lungs: Crackles bilaterally  Abdomen: Non-distended, non-tender, +bs   Musculoskeletal: Intact  Skin: wnl    LABS:  CBC  Recent Labs Lab 07/08/15 1037 07/08/15 1631 07/09/15 0445  WBC  --  19.8* 10.6*  HGB 12.6 10.6* 9.9*  HCT 37.0 32.6* 30.6*  PLT  --  145* 124*   Coag's  Recent Labs Lab 07/08/15 1631  INR 1.59*   BMET  Recent Labs Lab 07/08/15 1037 07/08/15 1631 07/09/15 0445  NA 133* 133* 130*  K 3.5 3.1* 3.9  CL  --  100* 95*  CO2  --  18* 23  BUN  --  26* 31*  CREATININE  --  3.82* 4.00*  GLUCOSE 188* 255* 300*   Electrolytes  Recent Labs Lab 07/08/15 1631 07/09/15 0445  CALCIUM 8.3* 8.5*   Sepsis Markers No results for input(s): LATICACIDVEN, PROCALCITON, O2SATVEN in the last 168 hours. ABG  Recent Labs Lab 07/08/15 1543 07/08/15 1727 07/09/15 0250  PHART 7.285* 7.349* 7.440  PCO2ART 36.6 41.3 30.9*  PO2ART 428.0* 483.0* 173.0*   Liver Enzymes  Recent Labs Lab 07/08/15 1631 07/09/15 0445  AST 87* 53*  ALT 41 35  ALKPHOS 136* 119  BILITOT 2.2* 1.3*  ALBUMIN 2.8* 2.6*   Cardiac Enzymes  Recent Labs Lab 07/08/15 1502 07/08/15 2105 07/09/15 0445  TROPONINI 0.07* 0.20* 0.20*   Glucose  Recent Labs Lab 07/08/15 0903 07/08/15 1255 07/08/15 1548 07/08/15 1931 07/08/15 2056 07/09/15 0338  GLUCAP 172* 173* 271* 212* 210* 266*  Imaging Dg Chest Port 1 View  07/08/2015  CLINICAL DATA:  Intubated.  Central line placement. EXAM: PORTABLE CHEST 1 VIEW COMPARISON:  Earlier today. FINDINGS: Endotracheal tube in satisfactory position. Right jugular catheter tip in the superior vena cava. No pneumothorax. The left jugular double-lumen catheter is unchanged with the catheter tips in the region of the confluence of the innominate veins. Nasogastric tube extending into the stomach. Stable enlarged cardiac silhouette. Mild decrease in prominence of the pulmonary vasculature. Stable mildly prominent interstitial markings. No pleural fluid. Unremarkable bones. IMPRESSION: 1. Endotracheal tube in  satisfactory position. 2. The left jugular double-lumen catheter remains proximally positioned with the catheter tips in the region of the confluence of the innominate veins. 3. Mildly improved pulmonary vascular congestion. 4. Stable cardiomegaly and mild chronic interstitial lung disease. Electronically Signed   By: Beckie Salts M.D.   On: 07/08/2015 17:17   Dg Chest Port 1 View  07/08/2015  CLINICAL DATA:  Post cardiac arrest.  Intubation. EXAM: PORTABLE CHEST 1 VIEW COMPARISON:  06/24/2015 FINDINGS: Endotracheal tube is in place with tip in the lower trachea estimated to be approximately 4.7 cm above the carina. Patient has a left internal jugular central venous line, tip overlying the level of the superior vena cava at its confluence with the brachiocephalic vein. Heart is enlarged. There is mild pulmonary vascular congestion. No overt edema identified. No acute fractures or obvious mass pneumothorax. IMPRESSION: 1. Interval intubation. 2. Mild pulmonary vascular congestion. Electronically Signed   By: [name] M.D.   On: 07/08/2015 16:02   Dg Abd Portable 1v  07/08/2015  CLINICAL DATA:  Enteric tube placement. EXAM: PORTABLE ABDOMEN - 1 VIEW COMPARISON:  06/22/2014 abdominal radiograph FINDINGS: Enteric tube terminates in distal stomach. No dilated small bowel loops. Mild stool throughout the colon. No evidence of pneumatosis or pneumoperitoneum. No pathologic soft tissue calcifications. IMPRESSION: Enteric tube terminates in the distal stomach. Electronically Signed   By: Delbert Phenix M.D.   On: 07/08/2015 17:18   Ct Portable Head W/o Cm  07/08/2015  CLINICAL DATA:  59 year old female status post cardiac arrest. Initial encounter. EXAM: PORTABLE CT HEAD WITHOUT CONTRAST TECHNIQUE: Portable CT axial images were obtained in the ICU. COMPARISON:  Head CT without contrast 06/25/2014. FINDINGS: Portable examination due to the patient's un stable condition in the ICU. However, due to body  habitus the caudal aspect of the brain, below the cerebral peduncles, could not be imaged. Visible cerebral volume is stable since 2015. 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 No ventriculomegaly. No midline shift. Visible basilar cisterns are patent. No intracranial mass effect is evident. Partially visible hypodensity in the left occipital pole, appears stable to the prior study. Visualized paranasal sinuses and mastoids are clear. No acute osseous abnormality identified. No acute orbit or scalp soft tissue findings. Calcified supraclinoid ICA atherosclerosis. IMPRESSION: 1. Portable CT head exam in the ICU, during which the caudal half of the brain could not be imaged. 2. Visualized brain parenchyma is stable since 2015, with chronic ischemic changes in the left hemisphere. Electronically Signed   By: Odessa Fleming M.D.   On: 07/08/2015 20:41     ASSESSMENT / PLAN:  PULMONARY OETT 10/27 >>  A: Acute hypoxic respiratory failure in the setting of cardiopulmonary arrest Mild pulmonary edema  Obesity OSA/OHS  Suspect secondary to residual anesthesia superimposed on underlying obesity hypoventilation and metabolic derangements  P:  Full vent support  Start weaning today with SBT and possible extubation  VAP bundle  Wean FiO2 to sat >92%   CARDIOVASCULAR CVL 10/27>> A:  S/p cardiac arrest--PEA; expected down time 4-6 minutes  H/o LBBB H/o HTN  H/o CHF  P: Tele  CVP monitoring  Troponin Q6 x3 - stable Hold home anti-hypertensives   RENAL A:  ESRD on dialysis --> last dialysis 10/26  Metabolic acidosis - resolved Hyponatremia P:  Will need HD today Renal following Renal dose meds  F/u bmet   GASTROINTESTINAL A:  H/o GERD  P:  Protonix   HEMATOLOGIC A:  Anemia of chronic disease - stable, Mild dilutional drop in all cell lines P:  DVT prophylaxis with subq heparin F/u CBC   INFECTIOUS A:  Possible UTI - given  UA with nitrite +, small leuks P:  Monitor WBC/fever curve  Send UCx  ENDOCRINE A:  H/o DM  H/o hypothyroidism  P:  CBGs and SSI  IV levothyroxine   NEUROLOGIC A:  S/p Cardiac arrest concern for possible hypoxic encephalopathy - improving MS  P:  RASS goal: -1 to -2  PRN fentanyl   FAMILY  - Updates: patient updated at beside 10/28  - Inter-disciplinary family meet or Palliative Care meeting due by: 11/3   Erasmo DownerAngela M Bacigalupo, MD, MPH PGY-2,  [facility] 07/09/2015 8:00 AM  Attending Note:  59 year old female post arrest in OR when was getting an AV fistula placed.  Six minutes arrest, woke up and followed command, no new findings on head CT.  Currently weaning.  On exam is following commands, lungs with diffuse crackles but nothing acute.  CXR I reviewed mild pulmonary edema.  Plan on extubation today and starting lantus at low dose.  D/C all sedation.  KVO IVF.  Dialysis today.  Change IV medication to PO.  PT/OT and OOB to chair, titrate O2 down and start diet as ordered.  Will hold in the ICU today and likely transfer to SDU in AM and sign off.  The patient is critically ill with multiple organ systems failure and requires high complexity decision making for assessment and support, frequent evaluation and titration of therapies, application of advanced monitoring technologies and extensive interpretation of multiple databases.   Critical Care Time devoted to patient care services described in this note is  35  Minutes. This time reflects time of care of this signee Dr Koren BoundWesam Charee Tumblin. This critical care time does not reflect procedure time, or teaching time or supervisory time of PA/NP/Med student/Med Resident etc but could involve care discussion time.  Alyson ReedyWesam G. Ravneet Spilker, M.D. [facility] Pulmonary/Critical Care Medicine. Pager: [phone]. After hours pager: [phone].

## 2015-07-09 NOTE — Procedures (Signed)
Extubation Procedure Note  Patient Details:   Name: Galen ManilaSusan K Highbaugh DOB: [birth year] MRN: [id number]   Airway Documentation:  Airway 7.5 mm (Active)  Secured at (cm) 23 cm 07/09/2015  8:16 AM  Measured From Lips 07/09/2015  8:16 AM  Secured Location Right 07/09/2015  8:16 AM  Secured By [employer] Tube Holder 07/09/2015  8:16 AM  Tube Holder Repositioned Yes 07/09/2015  8:16 AM  Cuff Pressure (cm H2O) 24 cm H2O 07/09/2015  8:16 AM  Site Condition Dry 07/09/2015  8:16 AM   Pt extubated to 3lpm Avis, incentive spirometer achieved 750-1000cc's x10breaths.  Evaluation  O2 sats: stable throughout Complications: No apparent complications Patient did tolerate procedure well. Bilateral Breath Sounds: Clear, Diminished   Yes  Renae FickleScott, Heylee Tant Michelle 07/09/2015, 10:48 AM

## 2015-07-09 NOTE — Progress Notes (Signed)
Notified Elink RN in regards to patient's UA indicating positive results for many bacteria. Elink RN said will communicate to [facility].

## 2015-07-09 NOTE — Progress Notes (Signed)
[facility] Rounding Note Subjective:  Pt weaning - remains intubated but is awake, appropriate  Objective Vital signs in last 24 hours: Filed Vitals:   07/09/15 0500 07/09/15 0600 07/09/15 0700 07/09/15 0800  BP:  141/52 145/56 138/55  Pulse: 82 77 78   Temp:   86 F (30 C) 97.5 F (36.4 C)  TempSrc:    Oral  Resp: Height:      Weight:      SpO2: 99% 100% 100% 100%   Weight change:   Intake/Output Summary (Last 24 hours) at 07/09/15 0853 Last data filed at 07/09/15 0700  Gross per 24 hour  Intake   2085 ml  Output    580 ml  Net   1505 ml    Physical Exam:   BP 138/55 mmHg  Pulse 78  Temp(Src) 97.5 F (36.4 C) (Oral)  Resp 22  Ht  (1.626 m)  Wt 146 kg (321 lb 14 oz)  BMI 55.22 kg/m2  SpO2 100% Gen.: Patient is intubated but is awake and alert, nods appropriately to questions ETT. OGT Cannot see neck veins Lungs: Lung fields clear Cardiovascular regular rate and rhythm. Very distant heart sounds  Abdomen obese, soft, nontender extremities - chronic woody edema of the abdominal wall Woody and pitting edema of extremities  PermCath in place dry dressing.  Labs: Basic Metabolic Panel:  Recent Labs Lab 07/08/15 1037 07/08/15 1524 07/08/15 1631 07/09/15 0445  NA 133* 131* 133* 130*  K 3.5 4.9 3.1* 3.9  CL  --   --  100* 95*  CO2  --   --  18* 23  GLUCOSE 188*  --  255* 300*  BUN  --   --  26* 31*  CREATININE  --   --  3.82* 4.00*  CALCIUM  --   --  8.3* 8.5*      Recent Labs Lab 07/08/15 1631 07/09/15 0445  AST 87* 53*  ALT 41 35  ALKPHOS 136* 119  BILITOT 2.2* 1.3*  PROT 6.6 6.6  ALBUMIN 2.8* 2.6*      Recent Labs Lab 07/08/15 1037 07/08/15 1524 07/08/15 1631 07/09/15 0445  WBC  --   --  19.8* 10.6*  HGB 12.6 12.6 10.6* 9.9*  HCT 37.0 37.0 32.6* 30.6*  MCV  --   --  99.7 96.8  PLT  --   --  145* 124*     Recent Labs Lab 07/08/15 1502 07/08/15 2105 07/09/15 0445  CKTOTAL 33*  --   --    CKMB 2.2  --   --   TROPONINI 0.07* 0.20* 0.20*     Recent Labs Lab 07/08/15 1255 07/08/15 1548 07/08/15 1931 07/08/15 2056 07/09/15 0338  GLUCAP 173* 271* 212* 210* 266*    Studies/Results: Dg Chest Port 1 View  07/08/2015  CLINICAL DATA:  Intubated.  Central line placement. EXAM: PORTABLE CHEST 1 VIEW COMPARISON:  Earlier today. FINDINGS: Endotracheal tube in satisfactory position. Right jugular catheter tip in the superior vena cava. No pneumothorax. The left jugular double-lumen catheter is unchanged with the catheter tips in the region of the confluence of the innominate veins. Nasogastric tube extending into the stomach. Stable enlarged cardiac silhouette. Mild decrease in prominence of the pulmonary vasculature. Stable mildly prominent interstitial markings. No pleural fluid. Unremarkable bones. IMPRESSION: 1. Endotracheal tube in satisfactory position. 2. The left jugular double-lumen catheter remains proximally positioned with the catheter tips in the region of the confluence of  the innominate veins. 3. Mildly improved pulmonary vascular congestion. 4. Stable cardiomegaly and mild chronic interstitial lung disease. Electronically Signed   By: Beckie SaltsSteven  Reid M.D.   On: 07/08/2015 17:17   Dg Chest Port 1 View  07/08/2015  CLINICAL DATA:  Post cardiac arrest.  Intubation. EXAM: PORTABLE CHEST 1 VIEW COMPARISON:  06/24/2015 FINDINGS: Endotracheal tube is in place with tip in the lower trachea estimated to be approximately 4.7 cm above the carina. Patient has a left internal jugular central venous line, tip overlying the level of the superior vena cava at its confluence with the brachiocephalic vein. Heart is enlarged. There is mild pulmonary vascular congestion. No overt edema identified. No acute fractures or obvious mass pneumothorax. IMPRESSION: 1. Interval intubation. 2. Mild pulmonary vascular congestion. Electronically Signed   By: Norva PavlovElizabeth  Brown M.D.   On: 07/08/2015 16:02   Dg  Abd Portable 1v  07/08/2015  CLINICAL DATA:  Enteric tube placement. EXAM: PORTABLE ABDOMEN - 1 VIEW COMPARISON:  06/22/2014 abdominal radiograph FINDINGS: Enteric tube terminates in distal stomach. No dilated small bowel loops. Mild stool throughout the colon. No evidence of pneumatosis or pneumoperitoneum. No pathologic soft tissue calcifications. IMPRESSION: Enteric tube terminates in the distal stomach. Electronically Signed   By: Delbert PhenixJason A Poff M.D.   On: 07/08/2015 17:18   Ct Portable Head W/o Cm  07/08/2015  CLINICAL DATA:  59 year old female status post cardiac arrest. Initial encounter. EXAM: PORTABLE CT HEAD WITHOUT CONTRAST TECHNIQUE: Portable CT axial images were obtained in the ICU. COMPARISON:  Head CT without contrast 06/25/2014. FINDINGS: Portable examination due to the patient's un stable condition in the ICU. However, due to body habitus the caudal aspect of the brain, below the cerebral peduncles, could not be imaged. Visible cerebral volume is stable since 2015. 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 No ventriculomegaly. No midline shift. Visible basilar cisterns are patent. No intracranial mass effect is evident. Partially visible hypodensity in the left occipital pole, appears stable to the prior study. Visualized paranasal sinuses and mastoids are clear. No acute osseous abnormality identified. No acute orbit or scalp soft tissue findings. Calcified supraclinoid ICA atherosclerosis. IMPRESSION: 1. Portable CT head exam in the ICU, during which the caudal half of the brain could not be imaged. 2. Visualized brain parenchyma is stable since 2015, with chronic ischemic changes in the left hemisphere. Electronically Signed   By: Odessa FlemingH  Hall M.D.   On: 07/08/2015 20:41   Medications: . sodium chloride 10 mL/hr at 07/09/15 0700   . antiseptic oral rinse  7 mL Mouth Rinse QID  . chlorhexidine gluconate  15 mL Mouth Rinse BID  . enoxaparin (LOVENOX) injection  30 mg  Subcutaneous Q24H  . insulin aspart  0-15 Units Subcutaneous 6 times per day  . levothyroxine  37.5 mcg Intravenous QAC breakfast  . pantoprazole (PROTONIX) IV  40 mg Intravenous Daily   [facility]- just taken down. HD Bath 2K/2 Ca Dialyzer 180 Heparin yes 6000 Access PermCath   Mircera 75 q 2 weeks- last hgb 10.4- also venofer 100 q tx. No PTH- calc 9.8, phos 4.5  Assessment/Plan:  59 year old white female with multiple medical issues who is status post a respiratory and PEA cardiac arrest in the setting of attempted permanent access placement  1. Status post respiratory/ cardiac arrest- patient resuscitated. Estimated to be down less than 5 minutes. Now the intensive care unit intubated under the care of critical care medicine. Vent is weaning and  patient is awake and appropriate - anticipate will be extubated today. 2. ESRD: Her schedule is Monday Wednesday Friday via PermCath. She has been dialyzed extra days due to volume overload recently. Dialysis orders written for today 3. Hypertension: Off pressors with some elevated pressures in the 160's. Volume removal should help here. 4. Anemia of ESRD: Preop hemoglobin was greater than 12 but was i-stat. Hb on CBC 10.6->down to 9.9 today. Mircera as outpt - will check to see when had last dose and continue her Fe load with HD. 5. Metabolic Bone Disease: No PTH at outpatient dialysis records at this time. Also unable to take by mouth meds at this time- last phos 4.5.  6. Vascular access - Dr. Edilia Bo notes "tiny arteries and veins" and felt probably would not have been successful if he had proceeded. Plans to "regroup" on access once medically stable and would like cardiac evaluation 7. HLD 8. COPD/chronic resp failure/home O2 9. OSA 10. MO 11. H/O diastolic CHF 12. CAD (cath 2011 circ dominant, 70-80% OM1, diffuse diabetic plaque esp distal LAD   Camille Bal, MD [facility] [phone]  pager 07/09/2015, 8:53 AM

## 2015-07-09 NOTE — Progress Notes (Signed)
   VASCULAR SURGERY ASSESSMENT & PLAN:  * 1 Day Post-Op s/p: aborted right AVG (cardiac arrest intraop)  *  Appreciate CCM's help. Hopefully extubation today.  * Had tiny arteries and veins. Did not proceed bc/ of hypotension, but it would not likely have been successful. We can regroup on access once she is medically ready. I suspect that she will need cardiac eval.  SUBJECTIVE: Alert. Follows commands  PHYSICAL EXAM: Filed Vitals:   07/09/15 0400 07/09/15 0500 07/09/15 0600 07/09/15 0700  BP: 138/50  141/52 145/56  Pulse: 75 82 77 78  Temp: 97.3 F (36.3 C)   86 F (30 C)  TempSrc:      Resp: 24 17 24 22   Height:      Weight:      SpO2: 100% 99% 100% 100%   Incisions OK Moves all extremities. Alert.   LABS: Lab Results  Component Value Date   WBC 10.6* 07/09/2015   HGB 9.9* 07/09/2015   HCT 30.6* 07/09/2015   MCV 96.8 07/09/2015   PLT 124* 07/09/2015   Lab Results  Component Value Date   CREATININE 4.00* 07/09/2015   Lab Results  Component Value Date   INR 1.59* 07/08/2015   CBG (last 3)   Recent Labs  07/08/15 1931 07/08/15 2056 07/09/15 0338  GLUCAP 212* 210* 266*    Active Problems:   Acute on chronic renal failure (HCC)   ESRD (end stage renal disease) (HCC)   Cardiogenic shock (HCC)   Acute respiratory failure with hypoxia [facility](HCC)    Cari Carawayhris Estelle Skibicki Beeper: [phone]: [phone] 07/09/2015

## 2015-07-10 ENCOUNTER — Inpatient Hospital Stay (HOSPITAL_COMMUNITY): Payer: Managed Care, Other (non HMO)

## 2015-07-10 DIAGNOSIS — J9600 Acute respiratory failure, unspecified whether with hypoxia or hypercapnia: Secondary | ICD-10-CM

## 2015-07-10 DIAGNOSIS — I4891 Unspecified atrial fibrillation: Secondary | ICD-10-CM

## 2015-07-10 LAB — GLUCOSE, CAPILLARY
GLUCOSE-CAPILLARY: 231 mg/dL — AB (ref 65–99)
GLUCOSE-CAPILLARY: 208 mg/dL — AB (ref 65–99)
GLUCOSE-CAPILLARY: 291 mg/dL — AB (ref 65–99)
Glucose-Capillary: 210 mg/dL — ABNORMAL HIGH (ref 65–99)
Glucose-Capillary: 246 mg/dL — ABNORMAL HIGH (ref 65–99)
Glucose-Capillary: 286 mg/dL — ABNORMAL HIGH (ref 65–99)

## 2015-07-10 LAB — TROPONIN I
TROPONIN I: 0.08 ng/mL — AB (ref <0.031)
Troponin I: 0.33 ng/mL — ABNORMAL HIGH (ref <0.031)
Troponin I: 0.96 ng/mL (ref <0.031)

## 2015-07-10 LAB — MAGNESIUM: Magnesium: 1.9 mg/dL (ref 1.7–2.4)

## 2015-07-10 LAB — CBC
HEMATOCRIT: 28 % — AB (ref 36.0–46.0)
Hemoglobin: 9.2 g/dL — ABNORMAL LOW (ref 12.0–15.0)
MCH: 32.6 pg (ref 26.0–34.0)
MCHC: 32.9 g/dL (ref 30.0–36.0)
MCV: 99.3 fL (ref 78.0–100.0)
Platelets: 128 10*3/uL — ABNORMAL LOW (ref 150–400)
RBC: 2.82 MIL/uL — ABNORMAL LOW (ref 3.87–5.11)
RDW: 19.7 % — AB (ref 11.5–15.5)
WBC: 8.7 10*3/uL (ref 4.0–10.5)

## 2015-07-10 LAB — BASIC METABOLIC PANEL
Anion gap: 12 (ref 5–15)
BUN: 20 mg/dL (ref 6–20)
CALCIUM: 8.5 mg/dL — AB (ref 8.9–10.3)
CO2: 25 mmol/L (ref 22–32)
CREATININE: 2.81 mg/dL — AB (ref 0.44–1.00)
Chloride: 97 mmol/L — ABNORMAL LOW (ref 101–111)
GFR calc non Af Amer: 17 mL/min — ABNORMAL LOW (ref >60)
GFR, EST AFRICAN AMERICAN: 20 mL/min — AB (ref >60)
GLUCOSE: 242 mg/dL — AB (ref 65–99)
Potassium: 3.5 mmol/L (ref 3.5–5.1)
Sodium: 134 mmol/L — ABNORMAL LOW (ref 135–145)

## 2015-07-10 LAB — URINE CULTURE: Culture: NO GROWTH

## 2015-07-10 LAB — HEPATITIS B SURFACE ANTIGEN: Hepatitis B Surface Ag: NEGATIVE

## 2015-07-10 LAB — PHOSPHORUS: Phosphorus: 3 mg/dL (ref 2.5–4.6)

## 2015-07-10 MED ORDER — METOPROLOL TARTRATE 1 MG/ML IV SOLN
INTRAVENOUS | Status: AC
  Filled 2015-07-10: qty 5

## 2015-07-10 MED ORDER — FENTANYL CITRATE (PF) 100 MCG/2ML IJ SOLN
INTRAMUSCULAR | Status: AC
  Filled 2015-07-10: qty 2

## 2015-07-10 MED ORDER — PROMETHAZINE HCL 25 MG/ML IJ SOLN
12.5000 mg | Freq: Once | INTRAMUSCULAR | Status: AC
  Administered 2015-07-10: 12.5 mg via INTRAVENOUS
  Filled 2015-07-10: qty 1

## 2015-07-10 MED ORDER — METOPROLOL TARTRATE 1 MG/ML IV SOLN
2.5000 mg | Freq: Once | INTRAVENOUS | Status: AC
  Administered 2015-07-10: 2.5 mg via INTRAVENOUS

## 2015-07-10 MED ORDER — PHENYLEPHRINE HCL 10 MG/ML IJ SOLN
30.0000 ug/min | INTRAVENOUS | Status: DC
  Filled 2015-07-10: qty 1

## 2015-07-10 MED ORDER — HEPARIN (PORCINE) IN NACL 100-0.45 UNIT/ML-% IJ SOLN
1550.0000 [IU]/h | INTRAMUSCULAR | Status: DC
  Administered 2015-07-11: 1100 [IU]/h via INTRAVENOUS
  Administered 2015-07-11: 1350 [IU]/h via INTRAVENOUS
  Administered 2015-07-12 – 2015-07-14 (×2): 1550 [IU]/h via INTRAVENOUS
  Filled 2015-07-10 (×8): qty 250

## 2015-07-10 MED ORDER — ASPIRIN EC 81 MG PO TBEC
81.0000 mg | DELAYED_RELEASE_TABLET | Freq: Every day | ORAL | Status: DC
  Administered 2015-07-11 – 2015-07-20 (×9): 81 mg via ORAL
  Filled 2015-07-10 (×10): qty 1

## 2015-07-10 MED ORDER — PANTOPRAZOLE SODIUM 40 MG PO TBEC
40.0000 mg | DELAYED_RELEASE_TABLET | Freq: Every day | ORAL | Status: DC
  Administered 2015-07-11 – 2015-07-20 (×9): 40 mg via ORAL
  Filled 2015-07-10 (×9): qty 1

## 2015-07-10 MED ORDER — RENA-VITE PO TABS
1.0000 | ORAL_TABLET | Freq: Every day | ORAL | Status: DC
Start: 2015-07-10 — End: 2015-07-20
  Administered 2015-07-10 – 2015-07-15 (×2): 1 via ORAL
  Filled 2015-07-10 (×10): qty 1

## 2015-07-10 MED ORDER — METOPROLOL TARTRATE 1 MG/ML IV SOLN: 2.5000 mg | INTRAVENOUS | Status: DC | PRN

## 2015-07-10 MED ORDER — DARBEPOETIN ALFA 100 MCG/0.5ML IJ SOSY
100.0000 ug | PREFILLED_SYRINGE | INTRAMUSCULAR | Status: DC
  Administered 2015-07-12: 100 ug via INTRAVENOUS
  Filled 2015-07-10 (×2): qty 0.5

## 2015-07-10 MED ORDER — METOPROLOL TARTRATE 1 MG/ML IV SOLN
5.0000 mg | Freq: Once | INTRAVENOUS | Status: AC
  Administered 2015-07-10: 5 mg via INTRAVENOUS

## 2015-07-10 MED ORDER — FENTANYL CITRATE (PF) 100 MCG/2ML IJ SOLN: 25.0000 ug | INTRAMUSCULAR | Status: DC | PRN

## 2015-07-10 MED ORDER — SODIUM CHLORIDE 0.9 % IV BOLUS (SEPSIS)
250.0000 mL | Freq: Once | INTRAVENOUS | Status: AC
  Administered 2015-07-10: 250 mL via INTRAVENOUS

## 2015-07-10 MED ORDER — INSULIN GLARGINE 100 UNIT/ML ~~LOC~~ SOLN
40.0000 [IU] | Freq: Two times a day (BID) | SUBCUTANEOUS | Status: DC
  Administered 2015-07-10 – 2015-07-20 (×18): 40 [IU] via SUBCUTANEOUS
  Filled 2015-07-10 (×23): qty 0.4

## 2015-07-10 MED ORDER — SODIUM CHLORIDE 0.9 % IV BOLUS (SEPSIS)
500.0000 mL | Freq: Once | INTRAVENOUS | Status: AC
  Administered 2015-07-10: 500 mL via INTRAVENOUS

## 2015-07-10 NOTE — Progress Notes (Signed)
Around 1200 pt complaining on nausea. Dr Delton CoombesByrum called and made aware. Also MD made aware of troponin results. New order for x1 of Phenergan 12.5mg  given. Shortly after talking to Dr Delton CoombesByrum pt stated "I feel like an elephant is sitting on my chest."  Dr Delton CoombesByrum again called and made aware of pts statement. Repeat 12 lead EKG done. CCM NP at bedside for evaluation.  Around 1230 pt transitioned back into NSR with HR around 100. BP continues to be on soft side with SBP in 90-80's. Cuff changed multiple times to different locations, with no change in BP.  Pt drowsy but does respond appropriately to orientation questions.  Will continue to monitor closely.

## 2015-07-10 NOTE — Progress Notes (Signed)
[facility] Progress Note   Assessment/Plan:  59 year old white female with multiple medical issues who is status post a respiratory and PEA cardiac arrest in the setting of attempted permanent access placement. Extubated. Now hypotensive by cuff but a-line 140s per RN- new rapid afib  1. Satus post respiratory/ cardiac arrest- patient resuscitated 10/27. Estimated to be down less than 5 minutes. Extubated Friday 10/28 2. New rapid afib- IV lopressor 3. ESRD: Her schedule is Monday Wednesday Friday via PermCath. She has been dialyzed extra days due to volume overload recently.  K 3.5 - needs added K bath 4. Hypertension/volume: Off pressors with some elevated pressures in the 160's. ALine  pressures were higher than cuff pressures (but aline out now)- getting prn MTP for afib- she was started on HD with severe volume excess; needs more reduction of volume but not urgent and given new rapid afib - prefer that is controlled before we dialyze.CXR  10/29 clear 5. Anemia of ESRD: Preop hemoglobin was greater than 12 but was i-stat. Hb on CBC 10.6->down to 9.9>9.2 today. Mircera 75 given 10/19 - redose Aranesp at higher equilvanet 100 on Monday plus continue her Fe load with HD 6. Metabolic Bone Disease: No PTH  Drawn yet P 3 - no binders yet 7. Vascular access - Dr. Edilia Bo notes "tiny arteries and veins" and felt probably would not have been successful if he had proceeded. Plans to "regroup" on access once medically stable and would like cardiac evaluation; given her size/ LE volume and poor arm veins, she is likely to be a perm cath patient for a while. 8. HLD 9. COPD/chronic resp failure/home O2 10. OSA 11. DM -per primary 12. H/O diastolic CHF 13. CAD (cath 2011 circ dominant, 70-80% OM1, diffuse diabetic plaque esp distal LAD 14. Nutrition - alb 2.6- renal carb mod diet- add vitamin 15. Contact precautions- need to continue after d/c  Sheffield Slider, PA-C [facility] Beeper [phone] 07/10/2015,10:57 AM  LOS: 2 days   I have seen and examined this patient and agree with plan and assessment in the above note. Tolerated HD yesterday with 3.6 liters off. Extubated successfully. This AM Afib with RVR getting IV lopressor. HR still 120's. No HD today. Usual MWF.  Kimberly Sondgeroth B,MD 07/10/2015 11:50 AM  Subjective:   Didn't feel like eating this am.  Objective Filed Vitals:   07/10/15 0744 07/10/15 0800 07/10/15 0900 07/10/15 1000  BP:  104/40 69/43 88/50   Pulse:  86 131 124  Temp: 98.9 F (37.2 C)     TempSrc: Oral     Resp:  Height:      Weight:      SpO2:  99% 99% 98%   Physical Exam General: talkative alert Heart: irrg rate 120s -1 33 Lungs: grossly clear - difficult exam due to body habitus Abdomen: obese  Extremities: bilateral LE, thigh/dependent edema- SCDs in place Dialysis Access: left IJ  Dialysis Orders: EDW 131- just taken down. HD Bath 2K/2 Ca Dialyzer 180Heparin yes 6000 Left IJ PermCath Mircera 75 q 2 weeks last10/19- last hgb 10.4- also venofer 100 q tx.through 11/4  No PTH- calc 9.8, phos 4.5   Additional Objective Labs: Basic Metabolic Panel:  Recent Labs Lab 07/08/15 1631 07/09/15 0445 07/10/15 0400  NA 133* 130* 134*  K 3.1* 3.9 3.5  CL 100* 95* 97*  CO2 18* 23 25  GLUCOSE 255* 300* 242*  BUN 26* 31* 20  CREATININE 3.82* 4.00*  2.81*  CALCIUM 8.3* 8.5* 8.5*  PHOS  --   --  3.0   Liver Function Tests:  Recent Labs Lab 07/08/15 1631 07/09/15 0445  AST 87* 53*  ALT 41 35  ALKPHOS 136* 119  BILITOT 2.2* 1.3*  PROT 6.6 6.6  ALBUMIN 2.8* 2.6*   CBC:  Recent Labs Lab 07/08/15 1631 07/09/15 0445 07/10/15 0400  WBC 19.8* 10.6* 8.7  HGB 10.6* 9.9* 9.2*  HCT 32.6* 30.6* 28.0*  MCV 99.7 96.8 99.3  PLT 145* 124* 128*   Cardiac Enzymes:  Recent Labs Lab 07/08/15 1502 07/08/15 2105 07/09/15 0445  CKTOTAL 33*  --   --   CKMB 2.2  --   --   TROPONINI 0.07* 0.20* 0.20*    CBG:  Recent Labs Lab 07/09/15 0802 07/09/15 1151 07/09/15 2353 07/10/15 0349 07/10/15 0746  GLUCAP 276* 249* 246* 231* 208*    Studies/Results: Dg Chest Port 1 View  07/10/2015  CLINICAL DATA:  Acute respiratory failure. EXAM: PORTABLE CHEST 1 VIEW COMPARISON:  07/08/2015, 06/24/2015 and 04/06/2015 FINDINGS: NG tube and ET tube have been removed. Central catheter is are in good position, unchanged. Pulmonary vascular congestion has resolved. Lungs are now clear. No effusions. IMPRESSION: Resolution of pulmonary vascular congestion. Electronically Signed   By: Francene BoyersJames  Maxwell M.D.   On: 07/10/2015 07:23   Dg Chest Port 1 View  07/08/2015  CLINICAL DATA:  Intubated.  Central line placement. EXAM: PORTABLE CHEST 1 VIEW COMPARISON:  Earlier today. FINDINGS: Endotracheal tube in satisfactory position. Right jugular catheter tip in the superior vena cava. No pneumothorax. The left jugular double-lumen catheter is unchanged with the catheter tips in the region of the confluence of the innominate veins. Nasogastric tube extending into the stomach. Stable enlarged cardiac silhouette. Mild decrease in prominence of the pulmonary vasculature. Stable mildly prominent interstitial markings. No pleural fluid. Unremarkable bones. IMPRESSION: 1. Endotracheal tube in satisfactory position. 2. The left jugular double-lumen catheter remains proximally positioned with the catheter tips in the region of the confluence of the innominate veins. 3. Mildly improved pulmonary vascular congestion. 4. Stable cardiomegaly and mild chronic interstitial lung disease. Electronically Signed   By: Beckie SaltsSteven  Reid M.D.   On: 07/08/2015 17:17   Dg Chest Port 1 View  07/08/2015  CLINICAL DATA:  Post cardiac arrest.  Intubation. EXAM: PORTABLE CHEST 1 VIEW COMPARISON:  06/24/2015 FINDINGS: Endotracheal tube is in place with tip in the lower trachea estimated to be approximately 4.7 cm above the carina. Patient has a left internal  jugular central venous line, tip overlying the level of the superior vena cava at its confluence with the brachiocephalic vein. Heart is enlarged. There is mild pulmonary vascular congestion. No overt edema identified. No acute fractures or obvious mass pneumothorax. IMPRESSION: 1. Interval intubation. 2. Mild pulmonary vascular congestion. Electronically Signed   By: Norva PavlovElizabeth  Brown M.D.   On: 07/08/2015 16:02   Dg Abd Portable 1v  07/08/2015  CLINICAL DATA:  Enteric tube placement. EXAM: PORTABLE ABDOMEN - 1 VIEW COMPARISON:  06/22/2014 abdominal radiograph FINDINGS: Enteric tube terminates in distal stomach. No dilated small bowel loops. Mild stool throughout the colon. No evidence of pneumatosis or pneumoperitoneum. No pathologic soft tissue calcifications. IMPRESSION: Enteric tube terminates in the distal stomach. Electronically Signed   By: Delbert PhenixJason A Poff M.D.   On: 07/08/2015 17:18   Ct Portable Head W/o Cm  07/08/2015  CLINICAL DATA:  59 year old female status post cardiac arrest. Initial encounter. EXAM: PORTABLE CT HEAD WITHOUT CONTRAST  TECHNIQUE: Portable CT axial images were obtained in the ICU. COMPARISON:  Head CT without contrast 06/25/2014. FINDINGS: Portable examination due to the patient's un stable condition in the ICU. However, due to body habitus the caudal aspect of the brain, below the cerebral peduncles, could not be imaged. Visible cerebral volume is stable since 2015. 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 No ventriculomegaly. No midline shift. Visible basilar cisterns are patent. No intracranial mass effect is evident. Partially visible hypodensity in the left occipital pole, appears stable to the prior study. Visualized paranasal sinuses and mastoids are clear. No acute osseous abnormality identified. No acute orbit or scalp soft tissue findings. Calcified supraclinoid ICA atherosclerosis. IMPRESSION: 1. Portable CT head exam in the ICU, during which the  caudal half of the brain could not be imaged. 2. Visualized brain parenchyma is stable since 2015, with chronic ischemic changes in the left hemisphere. Electronically Signed   By: Odessa Fleming M.D.   On: 07/08/2015 20:41   Medications: . sodium chloride 10 mL/hr at 07/09/15 1900   . antiseptic oral rinse  7 mL Mouth Rinse QID  . chlorhexidine gluconate  15 mL Mouth Rinse BID  . Chlorhexidine Gluconate Cloth  6 each Topical Q0600  . docusate sodium  100 mg Oral BID  . ferric gluconate (FERRLECIT/NULECIT) IV  125 mg Intravenous Q M,W,F-HD  . heparin subcutaneous  5,000 Units Subcutaneous 3 times per day  . insulin aspart  0-15 Units Subcutaneous 6 times per day  . insulin glargine  40 Units Subcutaneous BID  . levothyroxine  75 mcg Oral QAC breakfast  . metoprolol      . metoprolol      . mupirocin ointment  1 application Nasal BID  . pantoprazole  40 mg Oral Daily

## 2015-07-10 NOTE — Progress Notes (Signed)
CRITICAL VALUE ALERT  Critical value received: Troponin 0.96  Date of notification: 07/10/15  Time of notification:  2314  Critical value read back: Yes  Nurse who received alert: Hazel Samshristian Jaevin Medearis RN   MD notified (1st page): MD Deterding   Time of first page:  2318 MD notified (2nd page):  Time of second page:  Responding MD:  Pola CornElink RN Vic RipperYaunka   Time MD responded:  [phone]

## 2015-07-10 NOTE — Progress Notes (Signed)
   VASCULAR SURGERY ASSESSMENT & PLAN:  * 2 Days Post-Op s/p: aborted AVG secondary to cardiac arrest  *  Now extubated and doing well.   * ECHO: Severe Pulmonary HTN. Grade 2 diastolic dysfunction. Await Pulmonary Med input on this. May need Cardiac eval, or could this be done as an out-pt?  SUBJECTIVE: No complaints  PHYSICAL EXAM: Filed Vitals:   07/10/15 0400 07/10/15 0500 07/10/15 0600 07/10/15 0700  BP: 90/30 98/59 91/36  97/47  Pulse: 87 89 87 88  Temp:      TempSrc:      Resp: 23 24 26 24   Height:      Weight:      SpO2: 100% 100% 99% 98%   Incisions ok  LABS: Lab Results  Component Value Date   WBC 8.7 07/10/2015   HGB 9.2* 07/10/2015   HCT 28.0* 07/10/2015   MCV 99.3 07/10/2015   PLT 128* 07/10/2015   Lab Results  Component Value Date   CREATININE 2.81* 07/10/2015   Lab Results  Component Value Date   INR 1.59* 07/08/2015   CBG (last 3)   Recent Labs  07/09/15 1151 07/09/15 2353 07/10/15 0349  GLUCAP 249* 246* 231*    Active Problems:   Acute on chronic renal failure (HCC)   ESRD (end stage renal disease) (HCC)   Cardiogenic shock (HCC)   Acute respiratory failure with hypoxia [name](HCC)   [name] Beeper: [phone]: [phone] 07/10/2015

## 2015-07-10 NOTE — Progress Notes (Signed)
PULMONARY / CRITICAL CARE MEDICINE   Name: Kimberly [name] MRN: [id number] DOB: 1955-12-27    ADMISSION DATE:  07/08/2015 CONSULTATION DATE:  07/08/15  REFERRING MD :  Vascular surgery  CHIEF COMPLAINT:  Post cardiac arrest  INITIAL PRESENTATION: [age] yof with a PMH of HTN, HLD, CAD, COPD, CHF, DM, OSA, and ESRD secondary to DM (dialyzes MWF) [facility] fistula creation. Pt was undergoing procedure when she suffered from a cardiac arrest 10/27. Estimated down time 4-6 minutes, minimal. Procedure aborted.   STUDIES:  CT head >> caudal half of brain not visualized, stable with chronic ischemic changes in left hemisphere  SIGNIFICANT EVENTS: 10/27: Pt with cardiac arrest in OR  SUBJECTIVE / Interval events: extubated and looks good Developed A fib w HR 130's this am   VITAL SIGNS: Temp:  [86 F (30 C)-98.9 F (37.2 C)] 98.9 F (37.2 C) (10/29 0744) Pulse Rate:  [77-93] 88 (10/29 0700) Resp:  [0-28] 24 (10/29 0700) BP: (64-124)/(30-78) 97/47 mmHg (10/29 0700) SpO2:  [97 %-100 %] 98 % (10/29 0700) Arterial Line BP: (135-165)/(55-72) 136/55 mmHg (10/28 2000) Weight:  [294 lb 12.1 oz (133.7 kg)-300 lb 7.8 oz (136.3 kg)] 294 lb 12.1 oz (133.7 kg) (10/28 2011) HEMODYNAMICS: CVP:  [10 mmHg-43 mmHg] 43 mmHg VENTILATOR SETTINGS:   INTAKE / OUTPUT:  Intake/Output Summary (Last 24 hours) at 07/10/15 0820 Last data filed at 07/10/15 0600  Gross per 24 hour  Intake    570 ml  Output   3787 ml  Net  -3217 ml    PHYSICAL EXAMINATION: General: Chronically ill appearing female, NAD, speech clear Neuro: Alert, follows commands, moves all extremities  HEENT: NCAT, extubated, EOMI, PERRL Cardiovascular: tachy irregular S1/S2  Lungs: Crackles bilaterally  Abdomen: Non-distended, non-tender, +bs , obese Musculoskeletal: Intact  Skin: wnl    LABS:  CBC  Recent Labs Lab 07/08/15 1631 07/09/15 0445 07/10/15 0400  WBC 19.8* 10.6* 8.7  HGB 10.6* 9.9* 9.2*   HCT 32.6* 30.6* 28.0*  PLT 145* 124* 128*   Coag's  Recent Labs Lab 07/08/15 1631  INR 1.59*   BMET  Recent Labs Lab 07/08/15 1631 07/09/15 0445 07/10/15 0400  NA 133* 130* 134*  K 3.1* 3.9 3.5  CL 100* 95* 97*  CO2 18* 23 25  BUN 26* 31* 20  CREATININE 3.82* 4.00* 2.81*  GLUCOSE 255* 300* 242*   Electrolytes  Recent Labs Lab 07/08/15 1631 07/09/15 0445 07/10/15 0400  CALCIUM 8.3* 8.5* 8.5*  MG  --   --  1.9  PHOS  --   --  3.0   Sepsis Markers No results for input(s): LATICACIDVEN, PROCALCITON, O2SATVEN in the last 168 hours. ABG  Recent Labs Lab 07/08/15 1543 07/08/15 1727 07/09/15 0250  PHART 7.285* 7.349* 7.440  PCO2ART 36.6 41.3 30.9*  PO2ART 428.0* 483.0* 173.0*   Liver Enzymes  Recent Labs Lab 07/08/15 1631 07/09/15 0445  AST 87* 53*  ALT 41 35  ALKPHOS 136* 119  BILITOT 2.2* 1.3*  ALBUMIN 2.8* 2.6*   Cardiac Enzymes  Recent Labs Lab 07/08/15 1502 07/08/15 2105 07/09/15 0445  TROPONINI 0.07* 0.20* 0.20*   Glucose  Recent Labs Lab 07/08/15 2056 07/09/15 0338 07/09/15 0802 07/09/15 1151 07/09/15 2353 07/10/15 0349  GLUCAP 210* 266* 276* 249* 246* 231*    Imaging Dg Chest Port 1 View  07/10/2015  CLINICAL DATA:  Acute respiratory failure. EXAM: PORTABLE CHEST 1 VIEW COMPARISON:  07/08/2015, 06/24/2015 and 04/06/2015 FINDINGS: NG  tube and ET tube have been removed. Central catheter is are in good position, unchanged. Pulmonary vascular congestion has resolved. Lungs are now clear. No effusions. IMPRESSION: Resolution of pulmonary vascular congestion. Electronically Signed   By: Francene Boyers M.D.   On: 07/10/2015 07:23     ASSESSMENT / PLAN:  PULMONARY OETT 10/27 >> 10/28 A: Acute hypoxic respiratory failure in the setting of cardiopulmonary arrest Mild pulmonary edema  Obesity OSA/OHS  P:  Extubated 10/28 NAD 10/29 Push pulm hygiene   CARDIOVASCULAR CVL 10/27>> 10/29 new A fib RVR A:  S/p cardiac  arrest--PEA; expected down time 4-6 minutes  H/o LBBB H/o HTN  H/o CHF  P: Telemetry Troponin Q6 x3 - stable IV lopressor, ns bolus check 12 lead; may need to consider diltiazem depending on how she progresses If persists then will need to start heparin gtt  RENAL A:  ESRD on dialysis --> last dialysis 10/28 Metabolic acidosis - resolved Hyponatremia P:  Renal following Renal dose meds  F/u bmet   GASTROINTESTINAL A:  H/o GERD  P:  Protonix  Diet  HEMATOLOGIC A:  Anemia of chronic disease - stable, Mild dilutional drop in all cell lines P:  DVT prophylaxis with subq heparin; may change to heparin gtt as above F/u CBC   INFECTIOUS A:  Possible UTI - given UA with nitrite +, small leuks P:  Monitor WBC/fever curve  Send UCx, pending  ENDOCRINE A:  H/o DM  H/o hypothyroidism    Recent Labs  07/09/15 1151 07/09/15 2353 07/10/15 0349  GLUCAP 249* 246* 231*  P:  CBGs and SSI , increase lantus from 25 u q 12 h to 40 u, home dose 50 u q 12 h Levothyroxine   NEUROLOGIC A:  S/p Cardiac arrest concern for possible hypoxic encephalopathy - improved significantly P:  RASS 1 Awake and alert.intact, follows commands  FAMILY  - Updates: patient updated at beside 10/28  - Inter-disciplinary family meet or Palliative Care meeting due by: 11/3   Today's summary: Extubated post arrest. Neuro intact. Developed a fib rvr with rate 134, asymptomatic, note off home lopressor will give fluid bolus and IV lopressor and reevaluate . Keep in ICU   [facility] Minor ACNP Adolph Pollack PCCM Pager [phone] till 3 pm If no answer page [phone] 07/10/2015, 8:20 AM   Attending Note:  I have examined patient, reviewed labs, studies and notes. I have discussed the case with S Minor, and I agree with the data and plans as amended above. Extubated 10/28 post-arrest on 10/27. Cognition much improved. Developed A fib this am. Follow troponin and  telemetry. Plan to add back metoprolol, rate control. Will likely need to anticoagulate if persists.   Levy Pupa, MD, PhD 07/10/2015, 10:20 AM  Pulmonary and Critical Care [phone] or if no answer [phone]

## 2015-07-10 NOTE — Progress Notes (Signed)
PT Cancellation Note  Patient Details Name: Kimberly [name] MRN: [id number] DOB: 1956-02-07   Cancelled Treatment:    Reason Eval/Treat Not Completed: Medical issues which prohibited therapy (Pt in afib and hypotensive.  HOLD per nurse.) Will check back tomorrow.  Thanks.   [name], [name] F 07/10/2015, 12:51 PM  [employer] CorporationDawn Shalise Rosado,PT Acute Rehabilitation [phone] (pager)

## 2015-07-10 NOTE — Progress Notes (Signed)
eLink Physician-Brief Progress Note Patient Name: Kimberly [name] DOB: 22-Dec-1955 MRN: [id number]   Date of Service  07/10/2015  HPI/Events of Note  Elevation in trop following code in OR.  Trop of 0.96 up from 0.33.  eICU Interventions  Plan: ECHO already completed 12 Lead obtained earlier BB already ordered Start heparin gtt/ASA Continue to cycle trop     Intervention Category Intermediate Interventions: Other:  Suzy Kugel 07/10/2015, 11:28 PM

## 2015-07-10 NOTE — Progress Notes (Signed)
About 0905 pt changed from NSR to Afib with RVR. HR maintaining 120-130's. Pt states her head feels like "a rubber band is around my head" and complaining of "my heart fluttering and just not feeling well."  CCM NP on floor and notified. New orders received. 250cc NS bolus and 2.5mg  of IV Lopressor given. Despite interventions pt still in Afib with RVR. Pt reports no change in symptoms.  0930 Dr Delton CoombesByrum on floor and aware. New order for 5mg  of IV Lopressor given. Pt currently still in Afib with HR around 125. MD aware.  Will continue to monitor.

## 2015-07-10 NOTE — Progress Notes (Signed)
ANTICOAGULATION CONSULT NOTE - Initial Consult  Pharmacy Consult for heparin  Indication: chest pain/ACS  Allergies  Allergen Reactions  . Ciprofloxacin Itching  . Epinephrine     Increased heart rate    Patient Measurements: Height: 5\' 4"  (162.6 cm) Weight: 294 lb 12.1 oz (133.7 kg) IBW/kg (Calculated) : 54.7 Heparin Dosing Weight: 87.5 kg  Vital Signs: Temp: 98.4 F (36.9 C) (10/29 1944) Temp Source: Oral (10/29 1944) BP: 114/54 mmHg (10/29 2300) Pulse Rate: 84 (10/29 2300)  Labs:  Recent Labs  07/08/15 1502  07/08/15 1631  07/09/15 0445 07/10/15 0400 07/10/15 1055 07/10/15 1630 07/10/15 2212  HGB  --   < > 10.6*  --  9.9* 9.2*  --   --   --   HCT  --   < > 32.6*  --  30.6* 28.0*  --   --   --   PLT  --   --  145*  --  124* 128*  --   --   --   LABPROT  --   --  19.0*  --   --   --   --   --   --   INR  --   --  1.59*  --   --   --   --   --   --   CREATININE  --   --  3.82*  --  4.00* 2.81*  --   --   --   CKTOTAL 33*  --   --   --   --   --   --   --   --   CKMB 2.2  --   --   --   --   --   --   --   --   TROPONINI 0.07*  --   --   < > 0.20*  --  0.08* 0.33* 0.96*  < > = values in this interval not displayed.  Estimated Creatinine Clearance: 29 mL/min (by C-G formula based on Cr of 2.81).   Medical History: Past Medical History  Diagnosis Date  . HYPERLIPIDEMIA   . HYPERTENSION   . CAD, NATIVE VESSEL     May 10, 2010 cath showed a hyperdynamic LV function, she had dominant circumflex anatomy with a 70-80% small OM1. She had diffuse diabetic plaque particularly in the distal LAD. She nondominant RCA.  Nondominant  . PVD     CEA  . COPD   . Edema   . CHF (congestive heart failure) (HCC)     Preserved EF  . Cellulitis 10/15/2013    BILATERAL  . Diabetic neuropathy (HCC)   . Anasarca 05/2014  . Type II diabetes mellitus (HCC)   . Proliferative retinopathy     hx/notes 01/27/2010  . Peripheral neuropathy (HCC)     hx/notes 01/27/2010  .  Complication of anesthesia     "they had trouble reviving me after the latest carotid OR" (04/06/2015)  . Asthma   . On home oxygen therapy     "2.5L; 24/7" (04/06/2015)  . Pneumonia "several times"  . Chronic bronchitis (HCC)     "often; usually q yr" (04/06/2015)  . Hypothyroidism   . History of hiatal hernia   . GERD (gastroesophageal reflux disease)   . 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 Arthritis     "  aches and pains all over" (04/06/2015)  . Anxiety   . Depression   . Paralyzed vocal cords   . Heart attack [facility]) 2011    "on the table when I had my last carotid OR" (04/06/2015)  . Shortness of breath dyspnea     with exertion  . Sleep apnea     not on cpap    . Anemia   . Enlarged liver   . Constipation   . Chronic kidney disease     dialysis M-W-F    Medications:  Prescriptions prior to admission  Medication Sig Dispense Refill Last Dose  . aspirin EC 81 MG tablet Take 81 mg by mouth daily.   07/07/2015 at 2200  . insulin aspart (NOVOLOG) 100 UNIT/ML injection Inject 6 Units into the skin 3 (three) times daily with meals.   07/07/2015 at 2200  . insulin glargine (LANTUS) 100 UNIT/ML injection Inject 50 Units into the skin 2 (two) times daily.    07/07/2015 at 2200  . levothyroxine (SYNTHROID, LEVOTHROID) 75 MCG tablet Take 75 mcg by mouth daily before breakfast.   07/07/2015 at Unknown time  . metoprolol succinate (TOPROL-XL) 50 MG 24 hr tablet Take 1 tablet (50 mg total) by mouth daily. Take 1 and 1/2 tabs daily = 75 mg daily (Patient taking differently: Take 50 mg by mouth daily. ) 30 tablet 1 07/07/2015 at 2200  . Multiple Vitamins-Minerals (MULTIVITAMIN PO) Take 1 tablet by mouth daily.   Past Week at Unknown time  . oxyCODONE-acetaminophen (PERCOCET/ROXICET) 5-325 MG tablet Take 1 tablet by mouth every 6 (six) hours as needed. (Patient taking  differently: Take 1 tablet by mouth every 6 (six) hours as needed for moderate pain or severe pain. ) 15 tablet 0 Taking  . acetaminophen (TYLENOL) 325 MG tablet Take 650 mg by mouth every 6 (six) hours as needed for mild pain.   More than a month at Unknown time  . albuterol (PROVENTIL HFA;VENTOLIN HFA) 108 (90 BASE) MCG/ACT inhaler Inhale 1-2 puffs into the lungs every 6 (six) hours as needed for wheezing or shortness of breath.   More than a month at Unknown time  . potassium chloride SA (KLOR-CON M20) 20 MEQ tablet Take 2 tablets (40 mEq total) by mouth 2 (two) times daily. (Patient not taking: Reported on 07/07/2015) 360 tablet 3 Not Taking at Unknown time    Assessment: ESRD on HD, s/p cardiac arrest peri-operatively on 10/27. Now with new troponin elevation, will begin heparin gtt. Patient received SQ heparin 5000 units at 2200. Borderline low plts, hgb ok.  Goal of Therapy:  Heparin level 0.3-0.7 units/ml Monitor platelets by anticoagulation protocol: Yes    Plan:  -Withhold bolus given pt just received SQ heparin -Heparin 1100 units/hr -Daily HL, CBC -Monitor s/sx bleeding    Agapito Games, PharmD, BCPS Clinical Pharmacist Pager: [phone] 07/10/2015 11:41 PM

## 2015-07-11 ENCOUNTER — Encounter (HOSPITAL_COMMUNITY): Payer: Self-pay | Admitting: Cardiology

## 2015-07-11 DIAGNOSIS — I480 Paroxysmal atrial fibrillation: Secondary | ICD-10-CM

## 2015-07-11 LAB — BASIC METABOLIC PANEL
Anion gap: 11 (ref 5–15)
BUN: 32 mg/dL — AB (ref 6–20)
CO2: 26 mmol/L (ref 22–32)
Calcium: 9.4 mg/dL (ref 8.9–10.3)
Chloride: 98 mmol/L — ABNORMAL LOW (ref 101–111)
Creatinine, Ser: 3.52 mg/dL — ABNORMAL HIGH (ref 0.44–1.00)
GFR calc Af Amer: 15 mL/min — ABNORMAL LOW (ref >60)
GFR calc non Af Amer: 13 mL/min — ABNORMAL LOW (ref >60)
GLUCOSE: 156 mg/dL — AB (ref 65–99)
Potassium: 3.5 mmol/L (ref 3.5–5.1)
SODIUM: 135 mmol/L (ref 135–145)

## 2015-07-11 LAB — CBC
HCT: 31.1 % — ABNORMAL LOW (ref 36.0–46.0)
Hemoglobin: 9.7 g/dL — ABNORMAL LOW (ref 12.0–15.0)
MCH: 31.6 pg (ref 26.0–34.0)
MCHC: 31.2 g/dL (ref 30.0–36.0)
MCV: 101.3 fL — ABNORMAL HIGH (ref 78.0–100.0)
Platelets: 152 10*3/uL (ref 150–400)
RBC: 3.07 MIL/uL — ABNORMAL LOW (ref 3.87–5.11)
RDW: 20.1 % — ABNORMAL HIGH (ref 11.5–15.5)
WBC: 8.8 10*3/uL (ref 4.0–10.5)

## 2015-07-11 LAB — PROTIME-INR
INR: 1.42 (ref 0.00–1.49)
INR: 1.38 (ref 0.00–1.49)
PROTHROMBIN TIME: 17.4 s — AB (ref 11.6–15.2)
PROTHROMBIN TIME: 17.1 s — AB (ref 11.6–15.2)

## 2015-07-11 LAB — TROPONIN I
Troponin I: 1.05 ng/mL (ref <0.031)
Troponin I: 1.1 ng/mL (ref <0.031)

## 2015-07-11 LAB — HEPARIN LEVEL (UNFRACTIONATED)
HEPARIN UNFRACTIONATED: 0.18 [IU]/mL — AB (ref 0.30–0.70)
HEPARIN UNFRACTIONATED: 0.37 [IU]/mL (ref 0.30–0.70)

## 2015-07-11 LAB — GLUCOSE, CAPILLARY
Glucose-Capillary: 114 mg/dL — ABNORMAL HIGH (ref 65–99)
Glucose-Capillary: 214 mg/dL — ABNORMAL HIGH (ref 65–99)
Glucose-Capillary: 260 mg/dL — ABNORMAL HIGH (ref 65–99)

## 2015-07-11 MED ORDER — METOPROLOL TARTRATE 12.5 MG HALF TABLET
12.5000 mg | ORAL_TABLET | Freq: Two times a day (BID) | ORAL | Status: DC
  Administered 2015-07-11 – 2015-07-20 (×18): 12.5 mg via ORAL
  Filled 2015-07-11 (×21): qty 1

## 2015-07-11 MED ORDER — HEPARIN BOLUS VIA INFUSION
2500.0000 [IU] | Freq: Once | INTRAVENOUS | Status: AC
  Administered 2015-07-11: 2500 [IU] via INTRAVENOUS
  Filled 2015-07-11: qty 2500

## 2015-07-11 MED ORDER — WARFARIN - PHARMACIST DOSING INPATIENT
Freq: Every day | Status: DC
  Administered 2015-07-12: 19:00:00

## 2015-07-11 MED ORDER — WARFARIN SODIUM 4 MG PO TABS
4.0000 mg | ORAL_TABLET | Freq: Once | ORAL | Status: DC
  Filled 2015-07-11: qty 1

## 2015-07-11 NOTE — Progress Notes (Signed)
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 Pt was undergoing procedure when she suffered from a cardiac arrest 10/27. Estimated down time 4-6 minutes, minimal. Procedure aborted.   STUDIES:  CT head >> caudal half of brain not visualized, stable with chronic ischemic changes in left hemisphere  SIGNIFICANT EVENTS: 10/27: Pt with cardiac arrest in OR  SUBJECTIVE / Interval events: Back in NSR Feels better, no CP or pressure Note troponin increase, started on heparin gtt   VITAL SIGNS: Temp:  [97.2 F (36.2 C)-99.1 F (37.3 C)] 97.2 F (36.2 C) (10/30 0355) Pulse Rate:  [76-131] 79 (10/30 0700) Resp:  [0-39] 18 (10/30 0700) BP: (69-136)/(33-62) 107/47 mmHg (10/30 0700) SpO2:  [96 %-100 %] 99 % (10/30 0700) HEMODYNAMICS: CVP:  [19 mmHg-42 mmHg] 24 mmHg VENTILATOR SETTINGS:   INTAKE / OUTPUT:  Intake/Output Summary (Last 24 hours) at 07/11/15 0718 Last data filed at 07/11/15 0700  Gross per 24 hour  Intake 1062.42 ml  Output      0 ml  Net 1062.42 ml    PHYSICAL EXAMINATION: General: Chronically ill appearing female, NAD, speech clear Neuro: Alert, follows commands, moves all extremities  HEENT: NCAT, extubated, EOMI, PERRL Cardiovascular: tachy irregular S1/S2  Lungs: Crackles bilaterally  Abdomen: Non-distended, non-tender, +bs , obese Musculoskeletal: Intact  Skin: wnl    LABS:  CBC  Recent Labs Lab 07/09/15 0445 07/10/15 0400 07/11/15 0453  WBC 10.6* 8.7 8.8  HGB 9.9* 9.2* 9.7*  HCT 30.6* 28.0* 31.1*  PLT 124* 128* 152   Coag's  Recent Labs Lab 07/08/15 1631  INR 1.59*   BMET  Recent  Labs Lab 07/09/15 0445 07/10/15 0400 07/11/15 0453  NA 130* 134* 135  K 3.9 3.5 3.5  CL 95* 97* 98*  CO2 BUN 31* 20 32*  CREATININE 4.00* 2.81* 3.52*  GLUCOSE 300* 242* 156*   Electrolytes  Recent Labs Lab 07/09/15 0445 07/10/15 0400 07/11/15 0453  CALCIUM 8.5* 8.5* 9.4  MG  --  1.9  --   PHOS  --  3.0  --    Sepsis Markers No results for input(s): LATICACIDVEN, PROCALCITON, O2SATVEN in the last 168 hours. ABG  Recent Labs Lab 07/08/15 1543 07/08/15 1727 07/09/15 0250  PHART 7.285* 7.349* 7.440  PCO2ART 36.6 41.3 30.9*  PO2ART 428.0* 483.0* 173.0*   Liver Enzymes  Recent Labs Lab 07/08/15 1631 07/09/15 0445  AST 87* 53*  ALT 41 35  ALKPHOS 136* 119  BILITOT 2.2* 1.3*  ALBUMIN 2.8* 2.6*   Cardiac Enzymes  Recent Labs Lab 07/10/15 2212 07/10/15 2327 07/11/15 0453  TROPONINI 0.96* 1.05* 1.10*   Glucose  Recent Labs Lab 07/09/15 2353 07/10/15 0349 07/10/15 0746 07/10/15 1051 07/10/15 1350 07/10/15 1636  GLUCAP 246* 231* 208* 210* 291* 286*    Imaging No results found.   ASSESSMENT / PLAN:  PULMONARY OETT 10/27 >> 10/28 A: Acute hypoxic respiratory failure in the setting of cardiopulmonary arrest Mild pulmonary edema  Obesity OSA/OHS  P:  Extubated 10/28, NAD 10/29 Push pulm hygiene   CARDIOVASCULAR CVL 10/27>> 10/29 new A fib RVR A:  S/p cardiac arrest--PEA; expected down time 4-6 minutes  H/o LBBB H/o HTN  H/o CHF  P: Heparin running Telemetry Will ask cardiology to see her, followed by Dr Jens Somrenshaw Will restart low dose metoprolol and plan to increase as she can tolerate over next several days.    RENAL A:  ESRD on dialysis --> last dialysis 10/28 Metabolic acidosis - resolved Hyponatremia P:  Renal following, plan HD MWF Renal dose meds  F/u bmet   GASTROINTESTINAL A:  H/o GERD  P:  Protonix  Diet  HEMATOLOGIC A:  Anemia of chronic disease - stable, Mild dilutional  drop in all cell lines P:  DVT prophylaxis with subq heparin; may change to heparin gtt as above F/u CBC   INFECTIOUS A:  Possible UTI - given UA with nitrite +, small leuks P:  Monitor WBC/fever curve  Send UCx, pending  ENDOCRINE A:  H/o DM  H/o hypothyroidism    Recent Labs  07/10/15 1051 07/10/15 1350 07/10/15 1636  GLUCAP 210* 291* 286*  P:  CBGs and SSI , increase lantus from 25 u q 12 h to 40 u, home dose 50 u q 12 h Levothyroxine   NEUROLOGIC A:  S/p Cardiac arrest concern for possible hypoxic encephalopathy - improved significantly P:  RASS 1 Awake and alert.intact, follows commands  FAMILY  - Updates: patient updated at beside 10/28  - Inter-disciplinary family meet or Palliative Care meeting due by: 11/3   Today's summary: Extubated post arrest. Neuro intact. Developed a fib rvr with rate 134 on 10/29. Back in NSR, experienced troponin leak. Try to mobilize today. Continue heparin. HD on Monday    Levy Pupaobert Byrum, MD, PhD 07/11/2015, 7:18 AM [facility] [phone] or if no answer [phone]

## 2015-07-11 NOTE — Progress Notes (Deleted)
Notified MD Patel in regards to patient getting up to beside commode due to having bilateral PE. MD Patel ordered beside commode to be used. Will continue to monitor and assess.  

## 2015-07-11 NOTE — Progress Notes (Signed)
[name] Progress Note   EVENTS AF with RVR yesterday - now on IV heparin, Dr. Jens Somrenshaw to see Primary team restarting low dose metoprolol Sinus rhythm in the 80's at the present time  Assessment/Plan:  59 year old white female with multiple medical issues who is status post a respiratory and PEA cardiac arrest in the setting of attempted permanent access placement. Extubated. Now hypotensive by cuff but a-line 140s per RN- new rapid afib  1. Status post PEA cardiac arrest- patient resuscitated 10/27. Estimated to be down less than 5 minutes. Extubated Friday 10/28.  2. AF with RVR 10/29 - SR now. On BB/IV heparin. Cards to see 3. H/O diastolic CHF 4. CAD (cath 2011 circ dominant, 70-80% OM1, diffuse diabetic plaque esp distal LAD) 5. ESRD: Monday Wednesday Friday via PermCath. She has been dialyzed extra days due to volume overload recently.  K 3.5 - needs added K bath. Next HD tomorrow. 6. Hypertension/volume: CXR 10/29 clear - has massive 3rd spaced extra volume on board - hard "woody edema" Can only slowly take this down. Hope not to provoke recurrent AF with vol removal - will HD tomorrow. BP's running low with addition of BB - hope will tolerate UF at all... 7. Anemia of ESRD: Preop hemoglobin was greater than 12 but was i-stat. Hb on CBC 10.6->has ranged mid 9's past 3 days. Mircera 75 given 10/19 - redose Aranesp at higher equivalent 100 mcg on Monday with HD plus continue her Fe load with HD 8. Metabolic Bone Disease: No PTH data available on pt. Check with HD tomorrow. P 3 - no binders yet 9. Vascular access - Dr. Edilia Boickson noted "tiny arteries and veins" and felt probably would not have been successful if he had proceeded. "No plans for attempted new access in the near future"  10. HLD 11. COPD/chronic resp failure/home O2 12. OSA 13. DM -per primary 14. Nutrition - alb 2.6- renal carb mod diet- add vitamin 15. Contact precautions- need to continue after d/c  (MRSA PCR +)   Subjective:    Feeling a little better today Just wishes we could "get all this extra fluid off her" Left had is swollen - looks like had IV stick in that hand  Objective Filed Vitals:   07/11/15 0600 07/11/15 0700 07/11/15 0736 07/11/15 0800  BP: 117/33 107/47  78/53  Pulse: 95 79  78  Temp:   98.3 F (36.8 C)   TempSrc:   Oral   Resp: 39 18  14  Height:      Weight:      SpO2: 97% 99%  98%   Physical Exam BP 78/53 mmHg  Pulse 78  Temp(Src) 98.3 F (36.8 C) (Oral)  Resp 14  Ht 5\' 4"  (1.626 m)  Wt 133.7 kg (294 lb 12.1 oz)  BMI 50.57 kg/m2  SpO2 98%  General: talkative alert Heart: Sinus rhythm in the 80's right now. Lungs: grossly clear Abdomen: obese  Extremities: bilateral LE, thigh/dependent edema - woody, hard edema legs to thighs Left hand puffy. [facility]ialysis [facility]  Dialysis Orders: EDW 131- just taken down. HD Bath 2K/2 Ca Dialyzer 180Heparin yes 6000 Left IJ PermCath Mircera 75 q 2 weeks last10/19- last hgb 10.4- also venofer 100 q tx.through 11/4  No PTH- calc 9.8, phos 4.5   Additional Objective Labs: Basic Metabolic Panel:  Recent Labs Lab 07/09/15 0445 07/10/15 0400 07/11/15 0453  NA 130* 134* 135  K 3.9 3.5 3.5  CL 95* 97*  98*  CO2 GLUCOSE 300* 242* 156*  BUN 31* 20 32*  CREATININE 4.00* 2.81* 3.52*  CALCIUM 8.5* 8.5* 9.4  PHOS  --  3.0  --    Liver Function Tests:  Recent Labs Lab 07/08/15 1631 07/09/15 0445  AST 87* 53*  ALT 41 35  ALKPHOS 136* 119  BILITOT 2.2* 1.3*  PROT 6.6 6.6  ALBUMIN 2.8* 2.6*   CBC:  Recent Labs Lab 07/08/15 1631 07/09/15 0445 07/10/15 0400 07/11/15 0453  WBC 19.8* 10.6* 8.7 8.8  HGB 10.6* 9.9* 9.2* 9.7*  HCT 32.6* 30.6* 28.0* 31.1*  MCV 99.7 96.8 99.3 101.3*  PLT 145* 124* 128* 152   Cardiac Enzymes:  Recent Labs Lab 07/08/15 1502  07/10/15 1055 07/10/15 1630 07/10/15 2212 07/10/15 2327 07/11/15 0453  CKTOTAL 33*  --   --   --   --   --   --    CKMB 2.2  --   --   --   --   --   --   TROPONINI 0.07*  < > 0.08* 0.33* 0.96* 1.05* 1.10*  < > = values in this interval not displayed. CBG:  Recent Labs Lab 07/10/15 0746 07/10/15 1051 07/10/15 1350 07/10/15 1636 07/11/15 0740  GLUCAP 208* 210* 291* 286* 114*    Studies/Results: Dg Chest Port 1 View  07/10/2015  CLINICAL DATA:  Acute respiratory failure. EXAM: PORTABLE CHEST 1 VIEW COMPARISON:  07/08/2015, 06/24/2015 and 04/06/2015 FINDINGS: NG tube and ET tube have been removed. Central catheter is are in good position, unchanged. Pulmonary vascular congestion has resolved. Lungs are now clear. No effusions. IMPRESSION: Resolution of pulmonary vascular congestion. Electronically Signed   By: Francene Boyers M.D.   On: 07/10/2015 07:23   Medications: . sodium chloride 10 mL/hr at 07/10/15 1900  . heparin 1,100 Units/hr (07/11/15 0800)   . antiseptic oral rinse  7 mL Mouth Rinse QID  . aspirin EC  81 mg Oral Daily  . chlorhexidine gluconate  15 mL Mouth Rinse BID  . Chlorhexidine Gluconate Cloth  6 each Topical Q0600  . [START ON 07/12/2015] darbepoetin (ARANESP) injection - DIALYSIS  100 mcg Intravenous Q Mon-HD  . docusate sodium  100 mg Oral BID  . ferric gluconate (FERRLECIT/NULECIT) IV  125 mg Intravenous Q M,W,F-HD  . insulin aspart  0-15 Units Subcutaneous 6 times per day  . insulin glargine  40 Units Subcutaneous BID  . levothyroxine  75 mcg Oral QAC breakfast  . metoprolol tartrate  12.5 mg Oral BID  . multivitamin  1 tablet Oral QHS  . mupirocin ointment  1 application Nasal BID  . pantoprazole  40 mg Oral Daily   Camille Bal, MD [facility] [phone] Pager 07/11/2015, 9:22 AM

## 2015-07-11 NOTE — Progress Notes (Signed)
ANTICOAGULATION CONSULT NOTE - Initial Consult  Pharmacy Consult for heparin  Indication: chest pain/ACS  Allergies  Allergen Reactions  . Ciprofloxacin Itching  . Epinephrine     Increased heart rate    Patient Measurements: Height: 5\' 4"  (162.6 cm) Weight: 294 lb 12.1 oz (133.7 kg) IBW/kg (Calculated) : 54.7 Heparin Dosing Weight: 87.5 kg  Vital Signs: Temp: 98.3 F (36.8 C) (10/30 1218) Temp Source: Oral (10/30 1218) BP: 134/65 mmHg (10/30 1800) Pulse Rate: 81 (10/30 1800)  Labs:  Recent Labs  07/09/15 0445 07/10/15 0400  07/10/15 2212 07/10/15 2327 07/11/15 0453 07/11/15 0915 07/11/15 1600 07/11/15 1601  HGB 9.9* 9.2*  --   --   --  9.7*  --   --   --   HCT 30.6* 28.0*  --   --   --  31.1*  --   --   --   PLT 124* 128*  --   --   --  152  --   --   --   LABPROT  --   --   --   --   --   --   --   --  17.4*  INR  --   --   --   --   --   --   --   --  1.42  HEPARINUNFRC  --   --   --   --   --   --  0.18* 0.37  --   CREATININE 4.00* 2.81*  --   --   --  3.52*  --   --   --   TROPONINI 0.20*  --   < > 0.96* 1.05* 1.10*  --   --   --   < > = values in this interval not displayed.  Estimated Creatinine Clearance: 23.2 mL/min (by C-G formula based on Cr of 3.52).  Assessment: 59 y/o 146 kg female who was beginning induction for surgery for elective placement of R-AVF. Due to early operative breathing problems, she was placed on general anesthesia and her airway secured.  Pt became hypotensive, no pulse, and arrested.  Code called in OR and resuscitated, then transferred to MICU.    PMH: HTN, CAD, COPD (on home O2), CHF, DM, OSA, ESRD (HD MWF)  Anticoagulation: none pta. Heparin 1350 units/hr for possible acs/afib HL 0.37. INR today is 1.42   Goal of Therapy:  Heparin level 0.3-0.7 units/ml Monitor platelets by anticoagulation protocol: Yes  INR 2- 3   Plan:  -Continue heparin at 1350 units/hr -Warfarin 4 mg x 1 tonight -Daily HL, CBC, INR -Monitor s/sx  bleeding -F/U with MD to determine when to stop heparin  Isaac BlissMichael Mckynzi Cammon, PharmD, BCPS Clinical Pharmacist Pager [phone] 07/11/2015 6:30 PM

## 2015-07-11 NOTE — Evaluation (Signed)
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 Pt was undergoing procedure when she suffered from a cardiac arrest 10/27. Estimated down time 4-6 minutes, minimal. Procedure aborted.   Clinical Impression   Pt admitted with above diagnosis. Pt currently with functional limitations due to the deficits listed below (see PT Problem List).  Pt will benefit from skilled PT to increase their independence and safety with mobility to allow discharge to the venue listed below.       Follow Up Recommendations CIR    Equipment Recommendations  Other (comment) (to be determined)    Recommendations for Other Services OT consult;Rehab consult     Precautions / Restrictions Precautions Precautions: Other (comment) Precaution Comments: Painful L arm; history of chronic back pain Restrictions Weight Bearing Restrictions: No      Mobility  Bed Mobility Overal bed mobility: Needs Assistance;+2 for physical assistance Bed Mobility: Supine to Sit     Supine to sit: +2 for physical assistance;+2 for safety/equipment;Max assist     General bed mobility comments: Body habitus and abdominal girth lends to inefficient movement with getting to EOB; Physical assist to scoot hip sto EOB and elevate trunk to sitting; 2 person assist to lay back down  Transfers Overall transfer level: Needs assistance Equipment used: 2 person hand held assist Transfers: Sit to/from Stand Sit to Stand: Mod assist;+2 safety/equipment         General transfer comment: Light mod assist for power up; Used bed pad to support hips with initial sit to stand; Reports of back pain with transfer training, no dizziness sitting up or standing up  Ambulation/Gait             General Gait Details: Deferred today secondary to back pain and pt request to lay back down  Stairs            Wheelchair Mobility    Modified Rankin (Stroke Patients Only)       Balance Overall balance assessment: Needs assistance Sitting-balance support: Bilateral upper extremity supported Sitting balance-Leahy Scale: Fair     Standing balance support: Bilateral upper extremity supported Standing balance-Leahy Scale: Poor                               Pertinent Vitals/Pain Pain Assessment: Faces Faces Pain Scale: Hurts little more Pain Location: low back and sore R UE Pain Descriptors / Indicators: Grimacing Pain Intervention(s): Limited activity within patient's tolerance;Monitored during session    Home Living Family/patient expects to be discharged to:: Private residence Living Arrangements: Spouse/significant other Available Help at Discharge: Family;Available PRN/intermittently Type of Home: House Home Access: Ramped entrance     Home Layout: Multi-level Home Equipment: Art gallery managerlectric scooter;Wheelchair - [employer] Corporationmanual;Walker - 2 wheels;Walker - 4 wheels;Grab bars - toilet;Grab bars - tub/shower;Shower seat      Prior Function Level of Independence: Needs assistance      ADL's / Homemaking Assistance Needed: husband assists with all tasks        Hand Dominance   Dominant Hand: Right    Extremity/Trunk Assessment   Upper Extremity Assessment: Generalized weakness;RUE deficits/detail RUE Deficits / Details: R brachium sore after attempted fistula creation RUE: Unable to fully  assess due to pain       Lower Extremity Assessment: Generalized weakness         Communication   Communication: No difficulties  Cognition Arousal/Alertness: Awake/alert Behavior During Therapy: WFL for tasks assessed/performed Overall Cognitive Status: Within Functional Limits for tasks assessed                      General Comments General  comments (skin integrity, edema, etc.): VSS    Exercises        Assessment/Plan    PT Assessment Patient needs continued PT services  PT Diagnosis Generalized weakness;Difficulty walking   PT Problem List Decreased strength;Decreased range of motion;Decreased activity tolerance;Decreased balance;Decreased mobility;Decreased coordination;Decreased knowledge of use of DME;Decreased knowledge of precautions;Pain;Obesity  PT Treatment Interventions DME instruction;Gait training;Stair training;Functional mobility training;Therapeutic activities;Therapeutic exercise;Balance training;Patient/family education   PT Goals (Current goals can be found in the Care Plan section) Acute Rehab PT Goals Patient Stated Goal: Hopes to return to PLOF soon PT Goal Formulation: With patient Time For Goal Achievement: 07/25/15 Potential to Achieve Goals: Good    Frequency Min 3X/week   Barriers to discharge        Co-evaluation               End of Session   Activity Tolerance: Patient tolerated treatment well Patient left: in bed;with call bell/phone within reach (Dr. Donnie Aho with Cardiology with pt) Nurse Communication: Mobility status         Time: 6213-0865 PT Time Calculation (min) (ACUTE ONLY): 26 min   Charges:   PT Evaluation $Initial PT Evaluation Tier I: 1 Procedure PT Treatments $Therapeutic Activity: 8-22 mins   PT G CodesOlen Pel 07/11/2015, 2:54 PM  [place] Cl[place], [place]  [facility] Pager [phone] Office [phone]

## 2015-07-11 NOTE — Progress Notes (Signed)
ANTICOAGULATION CONSULT NOTE - Initial Consult  Pharmacy Consult for heparin  Indication: chest pain/ACS  Allergies  Allergen Reactions  . Ciprofloxacin Itching  . Epinephrine     Increased heart rate    Patient Measurements: Height: 5\' 4"  (162.6 cm) Weight: 294 lb 12.1 oz (133.7 kg) IBW/kg (Calculated) : 54.7 Heparin Dosing Weight: 87.5 kg  Vital Signs: Temp: 98.3 F (36.8 C) (10/30 0736) Temp Source: Oral (10/30 0736) BP: 78/53 mmHg (10/30 0800) Pulse Rate: 78 (10/30 0800)  Labs:  Recent Labs  07/08/15 1502  07/08/15 1631  07/09/15 0445 07/10/15 0400  07/10/15 2212 07/10/15 2327 07/11/15 0453 07/11/15 0915  HGB  --   < > 10.6*  --  9.9* 9.2*  --   --   --  9.7*  --   HCT  --   < > 32.6*  --  30.6* 28.0*  --   --   --  31.1*  --   PLT  --   < > 145*  --  124* 128*  --   --   --  152  --   LABPROT  --   --  19.0*  --   --   --   --   --   --   --   --   INR  --   --  1.59*  --   --   --   --   --   --   --   --   HEPARINUNFRC  --   --   --   --   --   --   --   --   --   --  0.18*  CREATININE  --   < > 3.82*  --  4.00* 2.81*  --   --   --  3.52*  --   CKTOTAL 33*  --   --   --   --   --   --   --   --   --   --   CKMB 2.2  --   --   --   --   --   --   --   --   --   --   TROPONINI 0.07*  --   --   < > 0.20*  --   < > 0.96* 1.05* 1.10*  --   < > = values in this interval not displayed.  Estimated Creatinine Clearance: 23.2 mL/min (by C-G formula based on Cr of 3.52).  Assessment: ESRD on HD, s/p cardiac arrest peri-operatively on 10/27. Now with new troponin elevation, will begin heparin gtt. Patient received SQ heparin 5000 units at 2200. Borderline low plts, hgb ok.  Goal of Therapy:  Heparin level 0.3-0.7 units/ml Monitor platelets by anticoagulation protocol: Yes    Plan:  -Heparin bolus of 2500 units followed by an increase of the infusion to 1350 units/hr -Next HL 1700 -Daily HL, CBC -Monitor s/sx bleeding  Isaac BlissMichael Kirstina Leinweber, PharmD, BCPS Clinical  Pharmacist Pager [phone] 07/11/2015 10:34 AM

## 2015-07-11 NOTE — Consult Note (Signed)
Cardiologist:  Stanford Breed Reason for Consult:  Chest pain, elevated troponin Referring Physician:   KEYASIA JOLLIFF is an 59 y.o. female.  HPI:   Patient is a 59 year old morbidly obese female last seen by Dr. Stanford Breed January 2016 at which time she was doing well on Demadex and metolazone.  She has a history of diastolic HF, diabetes, severe pulmonary hypertension, CAD, end-stage renal disease (hemodialysis Monday Wednesday Friday) and chronic respiratory failure. She was admitted 9/15 with anasarca in the setting of a/c diastolic CHF and a/c renal failure. She was off of her diuretics for some time. Prior to admission, she had gained 50 lbs. She had no improvement with outpatient initiation of her diuretics. She was followed by the Advanced HF team. She was diuresed with Lasix drip and milrinone.  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 She developed worsening renal function with cardiorenal syndrome. She was evaluated by nephrology and required dialysis ([facility]). She required brief intubation for worsening hypercarbic respiratory failure. She was eventually transitioned off of dialysis and back on to oral diuretics. She was down 70+ lbs at DC. Of note, she had markedly elevated TSH upon admission. Synthroid was initiated. She was discharged to [facility]   Her last left heart catheterization was August 2011 her ejection fraction was 70% with proximal LAD 30% (diabetic appearance), distal LAD 70%, proximal OM1 70-80% (very small), OM2 50%.  Her last echocardiogram was September 2015 ejection fraction was 55-60% with normal wall motion. Grade 2 diastolic dysfunction. Mild to moderate mitral valve stenosis with a mean gradient of 7 mmHg. Mild left atrial enlargement. Normal RV function. Mild TR. PASP 77 mmHg. Carotid US (3/15): RICA 40-59%, left CEA patent (velocities suggest 40-59%).   MUGA (5/13): Good quality study. EF 62%.  RHC (10/15): RA 2, PA 90/33 (53), PCWP 31, Thermo CO/CI 5.6/2.4, PVR 4.0 WU, SVR 792; Severe biventricular volume overload. With severe mixed pulmonary HTN (mostly pulmonary venous HTN).    Patient presented to [facility] for right arm AV fistula creation. During the procedure on October 27 she suffered cardiac arrest.  She had been placed under local anesthesia due to prior problems with sedation with propofol and went into respiratory arrest. Patient was in [place] CPR was initiated she had to rounds of epinephrine and epi drip was started.    Approximately 0830hrs yesterday morning the patient went into rapid atrial fibrillation that she spontaneously converted at approximately 1200 hrs.  she does not really remember that episode.  The nurse reports that she complained of an elephant sitting on her chest at the time.  However, the patient does not report any angina symptoms. Her breathing is about as usual. No nausea vomiting    Past Medical History  Diagnosis Date  . HYPERLIPIDEMIA   . HYPERTENSION   . CAD, NATIVE VESSEL     May 10, 2010 cath showed a hyperdynamic LV function, she had dominant circumflex anatomy with a 70-80% small OM1. She had diffuse diabetic plaque particularly in the distal LAD. She nondominant RCA.  Nondominant  . PVD     CEA  . COPD   . Edema   . CHF (congestive heart failure) (HCC)     Preserved EF  . Cellulitis 10/15/2013    BILATERAL  . Diabetic neuropathy ([place])   . Anasarca 05/2014  . Type II diabetes mellitus ([place])   . Proliferative retinopathy     hx/notes 01/27/2010  .  Peripheral neuropathy ([place])     hx/notes 01/27/2010  . Complication of anesthesia     "they had trouble reviving me after the latest carotid OR" (04/06/2015)  . Asthma   . On home oxygen therapy     "2.5L; 24/7" (04/06/2015)  . Pneumonia "several times"  . Chronic bronchitis ([place])     "often; usually q yr" (04/06/2015)  . Hypothyroidism   .  History of hiatal hernia   . GERD (gastroesophageal reflux disease)   . 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 Arthritis     "aches and pains all over" (04/06/2015)  . Anxiety   . Depression   . Paralyzed vocal cords   . Heart attack [facility]) 2011    "on the table when I had my last carotid OR" (04/06/2015)  . Shortness of breath dyspnea     with exertion  . Sleep apnea     not on cpap    . Anemia   . Enlarged liver   . Constipation   . Chronic kidney disease     dialysis M-W-F    Past Surgical History  Procedure Laterality Date  . Carotid endarterectomy Left X 2  . Vitrectomy Bilateral   . Cataract extraction w/ intraocular lens  implant, bilateral Bilateral   . Eye surgery      numerous surgeries  . Colonoscopy    . Insertion of dialysis catheter N/A 06/24/2015    Procedure: ULTRASOUND BILATERAL INTERNAL JUGULAR VEIN INSERTION OF DIALYSIS CATHETER LEFT INTERNAL JUGULAR VEIN ;  Surgeon: Mal Misty, MD;  Location: [place];  Service: Vascular;  Laterality: N/A;  . Av fistula placement Right 07/08/2015    Procedure: EXPLORATION RIGHT AXILLARY ARTERY AND RIGHT BRACHIAL VEIN;  Surgeon: Angelia Mould, MD;  Location: [facility] OR;  Service: Vascular;  Laterality: Right;    Family History  Problem Relation Age of Onset  . Cancer Mother     Breast, NHL  . Stroke Mother   . Peripheral vascular disease Father   . CAD Father 48  . Heart attack Father   . Hypertension Father   . Asthma Father   . Heart disease Father     before age 36    Social History:  reports that she quit smoking about 9 years ago. Her smoking use included Cigarettes. She has a 20 pack-year smoking history. She has never used smokeless tobacco. She reports that she does not drink alcohol or use illicit drugs.  Allergies:  Allergies  Allergen Reactions  . Ciprofloxacin Itching  .  Epinephrine     Increased heart rate    Medications: Scheduled Meds: . antiseptic oral rinse  7 mL Mouth Rinse QID  . aspirin EC  81 mg Oral Daily  . chlorhexidine gluconate  15 mL Mouth Rinse BID  . Chlorhexidine Gluconate Cloth  6 each Topical Q0600  . [START ON 07/12/2015] darbepoetin (ARANESP) injection - DIALYSIS  100 mcg Intravenous Q Mon-HD  . docusate sodium  100 mg Oral BID  . ferric gluconate (FERRLECIT/NULECIT) IV  125 mg Intravenous Q M,W,F-HD  . insulin aspart  0-15 Units Subcutaneous 6 times per day  . insulin glargine  40 Units Subcutaneous BID  . levothyroxine  75 mcg Oral QAC breakfast  . metoprolol tartrate  12.5 mg Oral BID  . multivitamin  1 tablet Oral QHS  .  mupirocin ointment  1 application Nasal BID  . pantoprazole  40 mg Oral Daily   Continuous Infusions: . sodium chloride 10 mL/hr at 07/10/15 1900  . heparin 1,350 Units/hr (07/11/15 1038)   PRN Meds:.acetaminophen **OR** acetaminophen, albuterol, fentaNYL (SUBLIMAZE) injection, heparin, hydrALAZINE, metoprolol, phenol   Results for orders placed or performed during the hospital encounter of 07/08/15 (from the past 48 hour(s))  Glucose, capillary     Status: Abnormal   Collection Time: 07/09/15 11:51 AM  Result Value Ref Range   Glucose-Capillary 249 (H) 65 - 99 mg/dL  Hepatitis B surface antigen     Status: None   Collection Time: 07/09/15  7:30 PM  Result Value Ref Range   Hepatitis B Surface Ag Negative Negative    Comment: (NOTE) Performed At: [facility] [place], [place] [id number] Lindon Romp MD DJ:[id number]   Glucose, capillary     Status: Abnormal   Collection Time: 07/09/15 11:53 PM  Result Value Ref Range   Glucose-Capillary 246 (H) 65 - 99 mg/dL   Comment 1 Notify RN    Comment 2 Document in Chart   Glucose, capillary     Status: Abnormal   Collection Time: 07/10/15  3:49 AM  Result Value Ref Range   Glucose-Capillary 231 (H) 65 - 99 mg/dL   Comment  1 Notify RN    Comment 2 Document in Chart   CBC     Status: Abnormal   Collection Time: 07/10/15  4:00 AM  Result Value Ref Range   WBC 8.7 4.0 - 10.5 K/uL   RBC 2.82 (L) 3.87 - 5.11 MIL/uL   Hemoglobin 9.2 (L) 12.0 - 15.0 g/dL   HCT 28.0 (L) 36.0 - 46.0 %   MCV 99.3 78.0 - 100.0 fL   MCH 32.6 26.0 - 34.0 pg   MCHC 32.9 30.0 - 36.0 g/dL   RDW 19.7 (H) 11.5 - 15.5 %   Platelets 128 (L) 150 - 400 K/uL  Basic metabolic panel     Status: Abnormal   Collection Time: 07/10/15  4:00 AM  Result Value Ref Range   Sodium 134 (L) 135 - 145 mmol/L   Potassium 3.5 3.5 - 5.1 mmol/L   Chloride 97 (L) 101 - 111 mmol/L   CO2 25 22 - 32 mmol/L   Glucose, Bld 242 (H) 65 - 99 mg/dL   BUN 20 6 - 20 mg/dL   Creatinine, Ser 2.81 (H) 0.44 - 1.00 mg/dL    Comment: DELTA CHECK NOTED   Calcium 8.5 (L) 8.9 - 10.3 mg/dL   GFR calc non Af Amer 17 (L) >60 mL/min   GFR calc Af Amer 20 (L) >60 mL/min    Comment: (NOTE) The eGFR has been calculated using the CKD EPI equation. This calculation has not been validated in all clinical situations. eGFR's persistently <60 mL/min signify possible Chronic Kidney Disease.    Anion gap 12 5 - 15  Magnesium     Status: None   Collection Time: 07/10/15  4:00 AM  Result Value Ref Range   Magnesium 1.9 1.7 - 2.4 mg/dL  Phosphorus     Status: None   Collection Time: 07/10/15  4:00 AM  Result Value Ref Range   Phosphorus 3.0 2.5 - 4.6 mg/dL  Glucose, capillary     Status: Abnormal   Collection Time: 07/10/15  7:46 AM  Result Value Ref Range   Glucose-Capillary 208 (H) 65 - 99 mg/dL  Glucose, capillary  Status: Abnormal   Collection Time: 07/10/15 10:51 AM  Result Value Ref Range   Glucose-Capillary 210 (H) 65 - 99 mg/dL  Troponin I     Status: Abnormal   Collection Time: 07/10/15 10:55 AM  Result Value Ref Range   Troponin I 0.08 (H) <0.031 ng/mL    Comment:        PERSISTENTLY INCREASED TROPONIN VALUES IN THE RANGE OF 0.04-0.49 ng/mL CAN BE SEEN IN:        -UNSTABLE ANGINA       -CONGESTIVE HEART FAILURE       -MYOCARDITIS       -CHEST TRAUMA       -ARRYHTHMIAS       -LATE PRESENTING MYOCARDIAL INFARCTION       -COPD   CLINICAL FOLLOW-UP RECOMMENDED.   Glucose, capillary     Status: Abnormal   Collection Time: 07/10/15  1:50 PM  Result Value Ref Range   Glucose-Capillary 291 (H) 65 - 99 mg/dL  Troponin I     Status: Abnormal   Collection Time: 07/10/15  4:30 PM  Result Value Ref Range   Troponin I 0.33 (H) <0.031 ng/mL    Comment:        PERSISTENTLY INCREASED TROPONIN VALUES IN THE RANGE OF 0.04-0.49 ng/mL CAN BE SEEN IN:       -UNSTABLE ANGINA       -CONGESTIVE HEART FAILURE       -MYOCARDITIS       -CHEST TRAUMA       -ARRYHTHMIAS       -LATE PRESENTING MYOCARDIAL INFARCTION       -COPD   CLINICAL FOLLOW-UP RECOMMENDED.   Glucose, capillary     Status: Abnormal   Collection Time: 07/10/15  4:36 PM  Result Value Ref Range   Glucose-Capillary 286 (H) 65 - 99 mg/dL  Troponin I     Status: Abnormal   Collection Time: 07/10/15 10:12 PM  Result Value Ref Range   Troponin I 0.96 (HH) <0.031 ng/mL    Comment:        POSSIBLE MYOCARDIAL ISCHEMIA. SERIAL TESTING RECOMMENDED. CRITICAL RESULT CALLED TO, READ BACK BY AND VERIFIED WITH: [name] C,RN 07/10/15 2314 WAYK   Troponin I (q 6hr x 3)     Status: Abnormal   Collection Time: 07/10/15 11:27 PM  Result Value Ref Range   Troponin I 1.05 (HH) <0.031 ng/mL    Comment:        POSSIBLE MYOCARDIAL ISCHEMIA. SERIAL TESTING RECOMMENDED. CRITICAL VALUE NOTED.  VALUE IS CONSISTENT WITH PREVIOUSLY REPORTED AND CALLED VALUE.   Basic metabolic panel     Status: Abnormal   Collection Time: 07/11/15  4:53 AM  Result Value Ref Range   Sodium 135 135 - 145 mmol/L   Potassium 3.5 3.5 - 5.1 mmol/L   Chloride 98 (L) 101 - 111 mmol/L   CO2 26 22 - 32 mmol/L   Glucose, Bld 156 (H) 65 - 99 mg/dL   BUN 32 (H) 6 - 20 mg/dL   Creatinine, Ser 3.52 (H) 0.44 - 1.00 mg/dL   Calcium 9.4  8.9 - 10.3 mg/dL   GFR calc non Af Amer 13 (L) >60 mL/min   GFR calc Af Amer 15 (L) >60 mL/min    Comment: (NOTE) The eGFR has been calculated using the CKD EPI equation. This calculation has not been validated in all clinical situations. eGFR's persistently <60 mL/min signify possible Chronic Kidney Disease.    Anion gap 11  5 - 15  CBC     Status: Abnormal   Collection Time: 07/11/15  4:53 AM  Result Value Ref Range   WBC 8.8 4.0 - 10.5 K/uL   RBC 3.07 (L) 3.87 - 5.11 MIL/uL   Hemoglobin 9.7 (L) 12.0 - 15.0 g/dL   HCT 31.1 (L) 36.0 - 46.0 %   MCV 101.3 (H) 78.0 - 100.0 fL   MCH 31.6 26.0 - 34.0 pg   MCHC 31.2 30.0 - 36.0 g/dL   RDW 20.1 (H) 11.5 - 15.5 %   Platelets 152 150 - 400 K/uL  Troponin I (q 6hr x 3)     Status: Abnormal   Collection Time: 07/11/15  4:53 AM  Result Value Ref Range   Troponin I 1.10 (HH) <0.031 ng/mL    Comment:        POSSIBLE MYOCARDIAL ISCHEMIA. SERIAL TESTING RECOMMENDED. CRITICAL VALUE NOTED.  VALUE IS CONSISTENT WITH PREVIOUSLY REPORTED AND CALLED VALUE.   Glucose, capillary     Status: Abnormal   Collection Time: 07/11/15  7:40 AM  Result Value Ref Range   Glucose-Capillary 114 (H) 65 - 99 mg/dL    Dg Chest Port 1 View  07/10/2015  CLINICAL DATA:  Acute respiratory failure. EXAM: PORTABLE CHEST 1 VIEW COMPARISON:  07/08/2015, 06/24/2015 and 04/06/2015 FINDINGS: NG tube and ET tube have been removed. Central catheter is are in good position, unchanged. Pulmonary vascular congestion has resolved. Lungs are now clear. No effusions. IMPRESSION: Resolution of pulmonary vascular congestion. Electronically Signed   By: Lorriane Shire M.D.   On: 07/10/2015 07:23    Review of Systems  HENT: Negative for congestion.   Respiratory: Negative for cough and shortness of breath.   Cardiovascular: Negative for chest pain.  Gastrointestinal: Negative for nausea, vomiting and abdominal pain.  Musculoskeletal: Positive for neck pain.  Neurological:  Negative for dizziness.  All other systems reviewed and are negative.  Blood pressure 78/53, pulse 78, temperature 98.3 F (36.8 C), temperature source Oral, resp. rate 14, height _0  (1.626 m), weight 294 lb 12.1 oz (133.7 kg), SpO2 98 %. Physical Exam  Nursing note and vitals reviewed. Constitutional: She is oriented to person, place, and time. She appears well-developed. No distress.  Morbidly obese  HENT:  Head: Normocephalic and atraumatic.  Eyes: EOM are normal. Pupils are equal, round, and reactive to light. No scleral icterus.  Neck: Normal range of motion.  Cardiovascular: Normal rate, regular rhythm, S1 normal and S2 normal.   No murmur heard. Pulses:      Radial pulses are 2+ on the right side, and 0 on the left side.       Dorsalis pedis pulses are 1+ on the right side, and 1+ on the left side.  Respiratory: Effort normal and breath sounds normal.  GI: Soft. Bowel sounds are normal. She exhibits no distension. There is no tenderness.  Musculoskeletal: She exhibits edema.  1+ left upper extremity edema. 1+ lower extremity edema  Neurological: She is alert and oriented to person, place, and time.  Generalized weakness  Skin: Skin is warm and dry.  Psychiatric: She has a normal mood and affect.   Cardiac Panel (last 3 results)  Recent Labs  07/08/15 1502  07/10/15 2212 07/10/15 2327 07/11/15 0453  CKTOTAL 33*  --   --   --   --   CKMB 2.2  --   --   --   --   TROPONINI 0.07*  < > 0.96* 1.05*  1.10*  RELINDX RELATIVE INDEX IS INVALID  --   --   --   --   < > = values in this interval not displayed.   Assessment/Plan: Active Problems:   Acute on chronic renal failure (HCC)   ESRD (end stage renal disease) (HCC)   Cardiogenic shock (HCC)   Acute respiratory failure with hypoxia (HCC)   Acute respiratory failure (HCC)   Atrial fibrillation (HCC)   elevated troponin   Demand ischemia   Acute on chronic diastolic heart failure  59 year old morbidly obese,  severely deconditioned, female with a history of diastolic HF, diabetes, severe pulmonary hypertension, CAD, end-stage renal disease (hemodialysis Monday Wednesday Friday). She presented to [facility] for right arm AV fistula creation. During the procedure on October 27 she suffered cardiac arrest requiring CPR and epinephrine.  Yesterday she went into rapid atrial fibrillation for approximately 3 hours and spontaneously converted at 1200 hrs. Apparently around 0900 hrs. patient was complaining of severe chest pain.  Subsequent troponins were elevated at 0.96, 1.05, 1.10. This trend is very flat. It is likely the patient was having demand ischemia due to rapid heart rate and known coronary disease.  She had a 2-D echocardiogram on October 28 which revealed normal ejection fraction of 55-60% and normal wall motion. Grade 2 diastolic dysfunction. Mitral valve showed mild stenosis. Peak PA pressure was 78 mmHg which is consistent with prior echo. The rest of her heart was poorly visualized.  She is not interested in invasive cardiac procedures.   CHADSVASC 6.  On IV heparin and will need coumadin.  She is on aspirin and Lopressor 12.5 mg twice daily. Would continue this and titrate as blood pressure will allow.   Most recent CXR showed resolution of pulm vasc congestion.  Tarri Fuller, PA-C 07/11/2015, 10:13 AM    Asked to see patient for atrial fibrillation occurring in the setting of a recent perioperative cardiac event.  Complicated history with severe pulmonary hypertension, chronic diastolic heart failure and diabetes.  She also has end-stage renal disease on hemodialysis.  In addition to the note above, she is brought in for fistula creation for dialysis and had an eye cardiac arrest.  She reported after surgery developing pulmonary edema at which time she had cardiac catheterization previously.  She had received some epinephrine and had atrial fibrillation with rapid response yesterday but is in sinus  rhythm today after 3 hours of atrial fibrillation today.  She complains of chest soreness but no anginal type pain.  She evidently had a previous stroke at the time of a previous cardiac arrest.  Echocardiogram shows preserved LV systolic function and significant pulmonary hypertension.  Examination is difficult due to her severe morbid obesity.  Lungs revealed decreased breath sounds at the bases, there was no murmur noted, she has severely large legs and has crusty skin over her calfs and ankles.    1.  Paroxysmal atrial fibrillation resolved but has a high CHA2DS2VASC score of 6.  I think she would benefit from anticoagulation with warfarin which would be the best agent for her in light of her chronic kidney disease 2.  Severe pulmonary hypertension 3.  Coronary artery disease previously diffuse with diabetes 4.  Elevation of troponin likely due to demand ischemia 5.  Severe morbid obesity 6.  Complicated diabetes with neuropathy nephropathy   Recommendations:  Consider warfarin anticoagulation going forward.  Beta blockers continued with her atrial fibrillation.  We'll follow with you.  Kerry Hough MD  FACC

## 2015-07-11 NOTE — Progress Notes (Signed)
   VASCULAR SURGERY ASSESSMENT & PLAN:  * 3 Days Post-Op s/p: aborted access placement right arm (intraoperative cardiac arrest)  *  Appreciate CCM's help. They plan on consulting cardiology given her atrial fibrillation.  * No plans for attempted new access in the near future.  SUBJECTIVE: No specific complaints.  PHYSICAL EXAM: Filed Vitals:   07/11/15 0500 07/11/15 0600 07/11/15 0700 07/11/15 0736  BP: 109/43 117/33 107/47   Pulse: 76 95 79   Temp:    98.3 F (36.8 C)  TempSrc:    Oral  Resp: 23 39 18   Height:      Weight:      SpO2: 100% 97% 99%    Incisions in right arm are intact.  Moderate ecchymosis.  LABS: Lab Results  Component Value Date   WBC 8.8 07/11/2015   HGB 9.7* 07/11/2015   HCT 31.1* 07/11/2015   MCV 101.3* 07/11/2015   PLT 152 07/11/2015   Lab Results  Component Value Date   CREATININE 3.52* 07/11/2015   Lab Results  Component Value Date   INR 1.59* 07/08/2015   CBG (last 3)   Recent Labs  07/10/15 1051 07/10/15 1350 07/10/15 1636  GLUCAP 210* 291* 286*    Active Problems:   Acute on chronic renal failure (HCC)   ESRD (end stage renal disease) (HCC)   Cardiogenic shock (HCC)   Acute respiratory failure with hypoxia (HCC)   Acute respiratory failure (HCC)   Atrial fibrillation (HCC)  Cari Carawayhris Tyshay Adee Beeper: [phone]: [phone] 07/11/2015

## 2015-07-12 DIAGNOSIS — D638 Anemia in other chronic diseases classified elsewhere: Secondary | ICD-10-CM

## 2015-07-12 DIAGNOSIS — I272 Other secondary pulmonary hypertension: Secondary | ICD-10-CM

## 2015-07-12 DIAGNOSIS — I10 Essential (primary) hypertension: Secondary | ICD-10-CM

## 2015-07-12 DIAGNOSIS — R400 Somnolence: Secondary | ICD-10-CM

## 2015-07-12 DIAGNOSIS — R4182 Altered mental status, unspecified: Secondary | ICD-10-CM | POA: Diagnosis present

## 2015-07-12 DIAGNOSIS — I480 Paroxysmal atrial fibrillation: Secondary | ICD-10-CM

## 2015-07-12 DIAGNOSIS — I050 Rheumatic mitral stenosis: Secondary | ICD-10-CM

## 2015-07-12 DIAGNOSIS — J449 Chronic obstructive pulmonary disease, unspecified: Secondary | ICD-10-CM

## 2015-07-12 DIAGNOSIS — E662 Morbid (severe) obesity with alveolar hypoventilation: Secondary | ICD-10-CM

## 2015-07-12 DIAGNOSIS — F4323 Adjustment disorder with mixed anxiety and depressed mood: Secondary | ICD-10-CM

## 2015-07-12 DIAGNOSIS — I5032 Chronic diastolic (congestive) heart failure: Secondary | ICD-10-CM

## 2015-07-12 DIAGNOSIS — I2721 Secondary pulmonary arterial hypertension: Secondary | ICD-10-CM | POA: Diagnosis present

## 2015-07-12 DIAGNOSIS — J9602 Acute respiratory failure with hypercapnia: Secondary | ICD-10-CM

## 2015-07-12 DIAGNOSIS — E1129 Type 2 diabetes mellitus with other diabetic kidney complication: Secondary | ICD-10-CM

## 2015-07-12 DIAGNOSIS — I5033 Acute on chronic diastolic (congestive) heart failure: Secondary | ICD-10-CM

## 2015-07-12 DIAGNOSIS — R5381 Other malaise: Secondary | ICD-10-CM

## 2015-07-12 DIAGNOSIS — E039 Hypothyroidism, unspecified: Secondary | ICD-10-CM

## 2015-07-12 DIAGNOSIS — N186 End stage renal disease: Secondary | ICD-10-CM

## 2015-07-12 DIAGNOSIS — Z992 Dependence on renal dialysis: Secondary | ICD-10-CM

## 2015-07-12 LAB — RENAL FUNCTION PANEL
ALBUMIN: 2.8 g/dL — AB (ref 3.5–5.0)
ALBUMIN: 2.9 g/dL — AB (ref 3.5–5.0)
ANION GAP: 11 (ref 5–15)
Anion gap: 9 (ref 5–15)
BUN: 38 mg/dL — AB (ref 6–20)
BUN: 41 mg/dL — ABNORMAL HIGH (ref 6–20)
CALCIUM: 9.4 mg/dL (ref 8.9–10.3)
CO2: 25 mmol/L (ref 22–32)
CO2: 25 mmol/L (ref 22–32)
CREATININE: 3.6 mg/dL — AB (ref 0.44–1.00)
Calcium: 9.3 mg/dL (ref 8.9–10.3)
Chloride: 100 mmol/L — ABNORMAL LOW (ref 101–111)
Chloride: 99 mmol/L — ABNORMAL LOW (ref 101–111)
Creatinine, Ser: 3.73 mg/dL — ABNORMAL HIGH (ref 0.44–1.00)
GFR calc Af Amer: 14 mL/min — ABNORMAL LOW (ref >60)
GFR, EST AFRICAN AMERICAN: 15 mL/min — AB (ref >60)
GFR, EST NON AFRICAN AMERICAN: 13 mL/min — AB (ref >60)
GFR, EST NON AFRICAN AMERICAN: 12 mL/min — AB (ref >60)
GLUCOSE: 125 mg/dL — AB (ref 65–99)
Glucose, Bld: 144 mg/dL — ABNORMAL HIGH (ref 65–99)
PHOSPHORUS: 5.1 mg/dL — AB (ref 2.5–4.6)
PHOSPHORUS: 5.3 mg/dL — AB (ref 2.5–4.6)
POTASSIUM: 3.9 mmol/L (ref 3.5–5.1)
Potassium: 3.9 mmol/L (ref 3.5–5.1)
SODIUM: 135 mmol/L (ref 135–145)
Sodium: 134 mmol/L — ABNORMAL LOW (ref 135–145)

## 2015-07-12 LAB — CBC WITH DIFFERENTIAL/PLATELET
BASOS PCT: 0 %
Basophils Absolute: 0 10*3/uL (ref 0.0–0.1)
Eosinophils Absolute: 0.3 10*3/uL (ref 0.0–0.7)
Eosinophils Relative: 3 %
HEMATOCRIT: 31.5 % — AB (ref 36.0–46.0)
Hemoglobin: 9.9 g/dL — ABNORMAL LOW (ref 12.0–15.0)
LYMPHS ABS: 1.3 10*3/uL (ref 0.7–4.0)
Lymphocytes Relative: 14 %
MCH: 32.2 pg (ref 26.0–34.0)
MCHC: 31.4 g/dL (ref 30.0–36.0)
MCV: 102.6 fL — AB (ref 78.0–100.0)
MONO ABS: 0.6 10*3/uL (ref 0.1–1.0)
MONOS PCT: 6 %
NEUTROS ABS: 7.3 10*3/uL (ref 1.7–7.7)
Neutrophils Relative %: 77 %
Platelets: 172 10*3/uL (ref 150–400)
RBC: 3.07 MIL/uL — ABNORMAL LOW (ref 3.87–5.11)
RDW: 19.9 % — AB (ref 11.5–15.5)
WBC: 9.6 10*3/uL (ref 4.0–10.5)

## 2015-07-12 LAB — SEDIMENTATION RATE: Sed Rate: 57 mm/hr — ABNORMAL HIGH (ref 0–22)

## 2015-07-12 LAB — GLUCOSE, CAPILLARY
GLUCOSE-CAPILLARY: 145 mg/dL — AB (ref 65–99)
GLUCOSE-CAPILLARY: 167 mg/dL — AB (ref 65–99)
GLUCOSE-CAPILLARY: 178 mg/dL — AB (ref 65–99)
GLUCOSE-CAPILLARY: 216 mg/dL — AB (ref 65–99)
GLUCOSE-CAPILLARY: 268 mg/dL — AB (ref 65–99)
Glucose-Capillary: 256 mg/dL — ABNORMAL HIGH (ref 65–99)
Glucose-Capillary: 154 mg/dL — ABNORMAL HIGH (ref 65–99)
Glucose-Capillary: 155 mg/dL — ABNORMAL HIGH (ref 65–99)
Glucose-Capillary: 113 mg/dL — ABNORMAL HIGH (ref 65–99)
Glucose-Capillary: 123 mg/dL — ABNORMAL HIGH (ref 65–99)
Glucose-Capillary: 125 mg/dL — ABNORMAL HIGH (ref 65–99)

## 2015-07-12 LAB — CBC
HEMATOCRIT: 31.8 % — AB (ref 36.0–46.0)
Hemoglobin: 9.7 g/dL — ABNORMAL LOW (ref 12.0–15.0)
MCH: 31.2 pg (ref 26.0–34.0)
MCHC: 30.5 g/dL (ref 30.0–36.0)
MCV: 102.3 fL — ABNORMAL HIGH (ref 78.0–100.0)
Platelets: 159 10*3/uL (ref 150–400)
RBC: 3.11 MIL/uL — AB (ref 3.87–5.11)
RDW: 20 % — AB (ref 11.5–15.5)
WBC: 9.8 10*3/uL (ref 4.0–10.5)

## 2015-07-12 LAB — HEPARIN LEVEL (UNFRACTIONATED)
HEPARIN UNFRACTIONATED: 0.2 [IU]/mL — AB (ref 0.30–0.70)
Heparin Unfractionated: 0.67 IU/mL (ref 0.30–0.70)

## 2015-07-12 LAB — C-REACTIVE PROTEIN: CRP: 6 mg/dL — AB (ref <1.0)

## 2015-07-12 MED ORDER — PENTAFLUOROPROP-TETRAFLUOROETH EX AERO: 1.0000 "application " | INHALATION_SPRAY | CUTANEOUS | Status: DC | PRN

## 2015-07-12 MED ORDER — LIDOCAINE HCL (PF) 1 % IJ SOLN: 5.0000 mL | INTRAMUSCULAR | Status: DC | PRN

## 2015-07-12 MED ORDER — SODIUM CHLORIDE 0.9 % IV SOLN: 100.0000 mL | INTRAVENOUS | Status: DC | PRN

## 2015-07-12 MED ORDER — LIDOCAINE-PRILOCAINE 2.5-2.5 % EX CREA: 1.0000 "application " | TOPICAL_CREAM | CUTANEOUS | Status: DC | PRN

## 2015-07-12 MED ORDER — ALTEPLASE 2 MG IJ SOLR: 2.0000 mg | Freq: Once | INTRAMUSCULAR | Status: DC | PRN

## 2015-07-12 MED ORDER — WARFARIN SODIUM 4 MG PO TABS
4.0000 mg | ORAL_TABLET | Freq: Once | ORAL | Status: AC
  Administered 2015-07-12: 4 mg via ORAL
  Filled 2015-07-12 (×2): qty 1

## 2015-07-12 MED ORDER — HEPARIN SODIUM (PORCINE) 1000 UNIT/ML DIALYSIS
1000.0000 [IU] | INTRAMUSCULAR | Status: DC | PRN
  Filled 2015-07-12: qty 1

## 2015-07-12 MED ORDER — HEPARIN SODIUM (PORCINE) 1000 UNIT/ML DIALYSIS
6000.0000 [IU] | Freq: Once | INTRAMUSCULAR | Status: DC
  Filled 2015-07-12: qty 6

## 2015-07-12 MED ORDER — HEPARIN SODIUM (PORCINE) 1000 UNIT/ML DIALYSIS
5000.0000 [IU] | Freq: Once | INTRAMUSCULAR | Status: DC
  Filled 2015-07-12: qty 5

## 2015-07-12 MED ORDER — DARBEPOETIN ALFA 100 MCG/0.5ML IJ SOSY
PREFILLED_SYRINGE | INTRAMUSCULAR | Status: AC
  Administered 2015-07-12: 100 ug via INTRAVENOUS
  Filled 2015-07-12: qty 0.5

## 2015-07-12 MED ORDER — INSULIN ASPART 100 UNIT/ML ~~LOC~~ SOLN
0.0000 [IU] | Freq: Three times a day (TID) | SUBCUTANEOUS | Status: DC
  Administered 2015-07-13: 3 [IU] via SUBCUTANEOUS
  Administered 2015-07-13: 2 [IU] via SUBCUTANEOUS
  Administered 2015-07-13: 3 [IU] via SUBCUTANEOUS
  Administered 2015-07-14: 2 [IU] via SUBCUTANEOUS
  Administered 2015-07-15 – 2015-07-16 (×2): 3 [IU] via SUBCUTANEOUS
  Administered 2015-07-16: 5 [IU] via SUBCUTANEOUS
  Administered 2015-07-17: 3 [IU] via SUBCUTANEOUS

## 2015-07-12 MED ORDER — ALTEPLASE 2 MG IJ SOLR
2.0000 mg | Freq: Once | INTRAMUSCULAR | Status: DC | PRN
  Filled 2015-07-12: qty 2

## 2015-07-12 MED ORDER — HEPARIN SODIUM (PORCINE) 1000 UNIT/ML DIALYSIS: 1000.0000 [IU] | INTRAMUSCULAR | Status: DC | PRN

## 2015-07-12 NOTE — Progress Notes (Addendum)
ANTICOAGULATION CONSULT NOTE - Follow Up Consult  Pharmacy Consult for Heparin and warfarin Indication: atrial fibrillation  Allergies  Allergen Reactions  . Ciprofloxacin Itching  . Epinephrine     Increased heart rate    Patient Measurements: Height: 5\' 4"  (162.6 cm) Weight: 292 lb 5.3 oz (132.6 kg) IBW/kg (Calculated) : 54.7  Vital Signs: Temp: 97.5 F (36.4 C) (10/31 0800) Temp Source: Oral (10/31 0800) BP: 136/56 mmHg (10/31 1000) Pulse Rate: 79 (10/31 1000)  Labs:  Recent Labs  07/10/15 2212 07/10/15 2327 07/11/15 0453  07/11/15 1600 07/11/15 1601 07/11/15 2000 07/12/15 0505 07/12/15 1409 07/12/15 1410 07/12/15 1928  HGB  --   --  9.7*  --   --   --   --  9.7* 9.9*  --   --   HCT  --   --  31.1*  --   --   --   --  31.8* 31.5*  --   --   PLT  --   --  152  --   --   --   --  159 172  --   --   LABPROT  --   --   --   --   --  17.4* 17.1*  --   --   --   --   INR  --   --   --   --   --  1.42 1.38  --   --   --   --   HEPARINUNFRC  --   --   --   < > 0.37  --   --  0.20*  --   --  0.67  CREATININE  --   --  3.52*  --   --   --   --  3.60*  --  3.73*  --   TROPONINI 0.96* 1.05* 1.10*  --   --   --   --   --   --   --   --   < > = values in this interval not displayed.  Estimated Creatinine Clearance: 22.5 mL/min (by C-G formula based on Cr of 3.6).   Assessment: Patient has afib. No anticoagulation PTA - CHADSVASC at least 6. Was started on heparin now bridging to warfarin. Was not charted that yesterday evening warfarin dose was given. Heparin was today held for line removal so will reschedule heparin level accordingly.  Goal of Therapy:  Heparin level 0.3-0.7 units/ml Monitor platelets by anticoagulation protocol: Yes   Plan:  -Continue heparin 1550 units/hr -Check HL in 6 hours -Warfarin 4 mg * 1 tonight -Monitor CBC, s/sx bleeding -F/u stop date of heparin  Sherron MondayAubrey N. Jones, PharmD Clinical Pharmacy Resident Pager: [phone] 07/12/2015 11:56  AM  ADDN: HL is therapeutic at 0.67 on heparin 1550 units/hr. No issues with infusion or bleeding noted.  Plan: Continue heparin 1550 units/hr Daily HL/CBC  Arlean Hoppingorey M. Newman PiesBall, PharmD Clinical Pharmacist Pager [phone]

## 2015-07-12 NOTE — Progress Notes (Signed)
  [name] Progress Note   Subjective: no complaints. CVP 17 last checked, 21 prior to that  Filed Vitals:   07/12/15 0700 07/12/15 0800 07/12/15 0900 07/12/15 1000  BP: 123/54 119/50 96/51 136/56  Pulse: 77 82 78 79  Temp:  97.5 F (36.4 C)    TempSrc:  Oral    Resp: 17 24    Height:      Weight:      SpO2: 99% 99% 99% 100%   Exam: Alert, no distress, morbidly obese No jvd Chest dec'd R base, L clear RRR no MRG Abd obese, ntnd +bs Ext diffuse 3-4+ tight pitting edema of bilat LE's, lower abd wall Neuro is alert, Ox 3   MWF [facility]   131kg  2/2 [place]  Heparin 6000   IJ cath Mircera 75 q 2 weeks last10/19- last hgb 10.4- also venofer 100 q tx.through 11/4   No PTH- calc 9.8, phos 4.5       Assessment: 1 Massive vol overload 2 PEA arrest 10/27 3 ESRD recent start mwf ,using cath 4 Afib - new, back in NSR now. 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 8 Vasc access- prob not candidate for perm access per VVS 9 COPD/ OHS home O2 10 DM2  Plan - daily HD for volume   Vinson Moselleob Leelynd Maldonado MD [facility] pager [phone]    cell [phone] 07/12/2015, 11:07 AM    Recent Labs Lab 07/10/15 0400 07/11/15 0453 07/12/15 0505  NA 134* 135 134*  K 3.5 3.5 3.9  CL 97* 98* 100*  CO2 25 26 25   GLUCOSE 242* 156* 144*  BUN 20 32* 38*  CREATININE 2.81* 3.52* 3.60*  CALCIUM 8.5* 9.4 9.3  PHOS 3.0  --  5.1*    Recent Labs Lab 07/08/15 1631 07/09/15 0445 07/12/15 0505  AST 87* 53*  --   ALT 41 35  --   ALKPHOS 136* 119  --   BILITOT 2.2* 1.3*  --   PROT 6.6 6.6  --   ALBUMIN 2.8* 2.6* 2.8*    Recent Labs Lab 07/10/15 0400 07/11/15 0453 07/12/15 0505  WBC 8.7 8.8 9.8  HGB 9.2* 9.7* 9.7*  HCT 28.0* 31.1* 31.8*  MCV 99.3 101.3* 102.3*  PLT 128* 152 159   . antiseptic oral rinse  7 mL Mouth Rinse QID  . aspirin EC  81 mg Oral Daily  . chlorhexidine gluconate  15 mL Mouth Rinse BID  . Chlorhexidine  Gluconate Cloth  6 each Topical Q0600  . darbepoetin (ARANESP) injection - DIALYSIS  100 mcg Intravenous Q Mon-HD  . docusate sodium  100 mg Oral BID  . ferric gluconate (FERRLECIT/NULECIT) IV  125 mg Intravenous Q M,W,F-HD  . insulin aspart  0-15 Units Subcutaneous 6 times per day  . insulin glargine  40 Units Subcutaneous BID  . levothyroxine  75 mcg Oral QAC breakfast  . metoprolol tartrate  12.5 mg Oral BID  . multivitamin  1 tablet Oral QHS  . mupirocin ointment  1 application Nasal BID  . pantoprazole  40 mg Oral Daily  . warfarin  4 mg Oral ONCE-1800  . Warfarin - Pharmacist Dosing Inpatient   Does not apply q1800   . sodium chloride Stopped (07/12/15 1100)  . heparin 1,550 Units/hr (07/12/15 1100)   acetaminophen **OR** acetaminophen, albuterol, fentaNYL (SUBLIMAZE) injection, heparin, hydrALAZINE, metoprolol, phenol

## 2015-07-12 NOTE — Progress Notes (Addendum)
   VASCULAR SURGERY ASSESSMENT & PLAN:  * 4 Days Post-Op s/p: aborted attempt at hemodialysis access right arm secondary to intraoperative cardiac arrest  *  Appreciate critical care medicine's help. The patient is being transferred out of the unit today and they have consult to triad hospitalist to further manage her care.  SUBJECTIVE: No specific complaints.  PHYSICAL EXAM: Filed Vitals:   07/12/15 0700 07/12/15 0800 07/12/15 0900 07/12/15 1000  BP: 123/54 119/50 96/51 136/56  Pulse: 77 82 78 79  Temp:  97.5 F (36.4 C)    TempSrc:  Oral    Resp: 17 24    Height:      Weight:      SpO2: 99% 99% 99% 100%   The arm incisions look fine.  LABS: Lab Results  Component Value Date   WBC 9.8 07/12/2015   HGB 9.7* 07/12/2015   HCT 31.8* 07/12/2015   MCV 102.3* 07/12/2015   PLT 159 07/12/2015   Lab Results  Component Value Date   CREATININE 3.60* 07/12/2015   Lab Results  Component Value Date   INR 1.38 07/11/2015   CBG (last 3)   Recent Labs  07/11/15 1930 07/12/15 0054 07/12/15 0431  GLUCAP 260* 178* 155*    Active Problems:   ESRD (end stage renal disease) (HCC)   Paroxysmal atrial fibrillation (HCC)   Altered mental status   Cari CarawayChris Markela Wee Beeper: [phone] 07/12/2015

## 2015-07-12 NOTE — Progress Notes (Signed)
I await OT eval and then I will proceed with discussions concerning pt's rehab venue options. [phone]

## 2015-07-12 NOTE — Progress Notes (Signed)
CVC already removed prior to my arrival. Kimberly [name], [name]

## 2015-07-12 NOTE — Progress Notes (Signed)
Patient Name: Kimberly ManilaSusan K [name] Date of Encounter: 07/12/2015  Active Problems:   ESRD (end stage renal disease) (HCC)   Paroxysmal atrial fibrillation (HCC)   Altered mental status   Length of Stay: 4  SUBJECTIVE  Alert, oriented, comfortable. Seen shortly after initiation of HD session.  CURRENT MEDS . antiseptic oral rinse  7 mL Mouth Rinse QID  . aspirin EC  81 mg Oral Daily  . chlorhexidine gluconate  15 mL Mouth Rinse BID  . Chlorhexidine Gluconate Cloth  6 each Topical Q0600  . darbepoetin (ARANESP) injection - DIALYSIS  100 mcg Intravenous Q Mon-HD  . docusate sodium  100 mg Oral BID  . ferric gluconate (FERRLECIT/NULECIT) IV  125 mg Intravenous Q M,W,F-HD  . insulin aspart  0-15 Units Subcutaneous TID WC  . insulin glargine  40 Units Subcutaneous BID  . levothyroxine  75 mcg Oral QAC breakfast  . metoprolol tartrate  12.5 mg Oral BID  . multivitamin  1 tablet Oral QHS  . mupirocin ointment  1 application Nasal BID  . pantoprazole  40 mg Oral Daily  . warfarin  4 mg Oral ONCE-1800  . Warfarin - Pharmacist Dosing Inpatient   Does not apply q1800    OBJECTIVE   Intake/Output Summary (Last 24 hours) at 07/12/15 1410 Last data filed at 07/12/15 1200  Gross per 24 hour  Intake 567.89 ml  Output      0 ml  Net 567.89 ml   Filed Weights   07/09/15 1600 07/09/15 2011 07/12/15 0400  Weight: 300 lb 7.8 oz (136.3 kg) 294 lb 12.1 oz (133.7 kg) 292 lb 5.3 oz (132.6 kg)    PHYSICAL EXAM Filed Vitals:   07/12/15 0700 07/12/15 0800 07/12/15 0900 07/12/15 1000  BP: 123/54 119/50 96/51 136/56  Pulse: 77 82 78 79  Temp:  97.5 F (36.4 C)    TempSrc:  Oral    Resp: 17 24    Height:      Weight:      SpO2: 99% 99% 99% 100%   General: Alert, oriented x3, no distress, morbid obesity precludes many parts of the exam. Seen during HD. Head: no evidence of trauma, PERRL, EOMI, no exophtalmos or lid lag, no myxedema, no xanthelasma; normal ears, nose and oropharynx Neck:  unable to see jugular venous pulsations or hepatojugular reflux; brisk carotid pulses without delay and no carotid bruits Chest: clear to auscultation, no signs of consolidation by percussion or palpation, normal fremitus, symmetrical and full respiratory excursions Cardiovascular: unable to find the apical impulse, regular rhythm, normal first and second heart sounds, no rubs or gallops, no murmur Abdomen: no tenderness or distention, no masses by palpation, no abnormal pulsatility or arterial bruits, normal bowel sounds, no hepatosplenomegaly Extremities: no clubbing, cyanosis; massive bilateral calf edema; 2+ radial, ulnar and brachial pulses bilaterally; unable to palpate pulses in lower extremities. Neurological: grossly nonfocal  LABS  CBC  Recent Labs  07/11/15 0453 07/12/15 0505  WBC 8.8 9.8  HGB 9.7* 9.7*  HCT 31.1* 31.8*  MCV 101.3* 102.3*  PLT 152 159   Basic Metabolic Panel  Recent Labs  07/10/15 0400 07/11/15 0453 07/12/15 0505  NA 134* 135 134*  K 3.5 3.5 3.9  CL 97* 98* 100*  CO2 25 26 25   GLUCOSE 242* 156* 144*  BUN 20 32* 38*  CREATININE 2.81* 3.52* 3.60*  CALCIUM 8.5* 9.4 9.3  MG 1.9  --   --   PHOS 3.0  --  5.1*  Liver Function Tests  Recent Labs  07/12/15 0505  ALBUMIN 2.8*   No results for input(s): LIPASE, AMYLASE in the last 72 hours. Cardiac Enzymes  Recent Labs  07/10/15 2212 07/10/15 2327 07/11/15 0453  TROPONINI 0.96* 1.05* 1.10*  Radiology Studies Imaging results have been reviewed and No results found.  TELE NSR No significant arrhythmia (lots of artifact)  ECG atrial fibrillation LBBB  ASSESSMENT AND PLAN  1. Cardiopulmonary arrest - respiratory arrest clearly preceded cardiac arrest (PEA). She received very little sedation before her local anesthetic was administered (1 mg Versed + Propofol 25 mcg/kg/min for a few minutes), but has had problems with exaggerated response to sedation before.  2. Paroxysmal atrial  fibrillation - she has frequently had sustained irregular palpitations at home before the current diagnostic arrhythmia. High risk for embolic stroke. Plan long term warfarin with INR 2-3. Has had a previous stroke. All major stroke risk factors are present except age, CHADS Vasc 6.  3. Diastolic HF, acute on chronic - now on HD. One year ago, "dry weight" was estimated at 250 lb or so. Now >40 lb above that  4. Severe pulmonary artery HTN - at cath 2015 was mostly due to pulmonary venous HTN (diastolic LV dysfunction +/- mild mitral stenosis), but there was also a component of intrinsic pulmonary arteriolar disease (obesity, OSA, COPD).  5. ESRD. Currently grossly hypervolemic, HD may be limited by BP, especially low DBP.  Thurmon Fair, MD, [facility] [phone] office [phone] pager 07/12/2015 2:10 PM

## 2015-07-12 NOTE — Progress Notes (Signed)
ANTICOAGULATION CONSULT NOTE - Follow Up Consult  Pharmacy Consult for Heparin  Indication: atrial fibrillation  Allergies  Allergen Reactions  . Ciprofloxacin Itching  . Epinephrine     Increased heart rate    Patient Measurements: Height: 5\' 4"  (162.6 cm) Weight: 294 lb 12.1 oz (133.7 kg) IBW/kg (Calculated) : 54.7  Vital Signs: Temp: 97.9 F (36.6 C) (10/31 0400) Temp Source: Oral (10/31 0400) BP: 112/58 mmHg (10/31 0500) Pulse Rate: 80 (10/31 0500)  Labs:  Recent Labs  07/10/15 0400  07/10/15 2212 07/10/15 2327 07/11/15 0453 07/11/15 0915 07/11/15 1600 07/11/15 1601 07/11/15 2000 07/12/15 0505  HGB 9.2*  --   --   --  9.7*  --   --   --   --  9.7*  HCT 28.0*  --   --   --  31.1*  --   --   --   --  31.8*  PLT 128*  --   --   --  152  --   --   --   --  159  LABPROT  --   --   --   --   --   --   --  17.4* 17.1*  --   INR  --   --   --   --   --   --   --  1.42 1.38  --   HEPARINUNFRC  --   --   --   --   --  0.18* 0.37  --   --  0.20*  CREATININE 2.81*  --   --   --  3.52*  --   --   --   --  3.60*  TROPONINI  --   < > 0.96* 1.05* 1.10*  --   --   --   --   --   < > = values in this interval not displayed.  Estimated Creatinine Clearance: 22.6 mL/min (by C-G formula based on Cr of 3.6).   Assessment: Sub-therapeutic heparin level, no issues per RN.   Goal of Therapy:  Heparin level 0.3-0.7 units/ml Monitor platelets by anticoagulation protocol: Yes   Plan:  -Increase heparin to 1550 units/hr -1400 HL  Carolene Gitto 07/12/2015,5:47 AM

## 2015-07-12 NOTE — Progress Notes (Addendum)
PULMONARY / CRITICAL CARE MEDICINE   Name: Kimberly [name] MRN: [id number] DOB: [birth year]    ADMISSION DATE:  07/08/2015 CONSULTATION DATE:  07/08/15  REFERRING MD :  Vascular surgery  CHIEF COMPLAINT:  Post cardiac arrest  INITIAL PRESENTATION: 76 yof with a PMH of HTN, HLD, CAD, COPD, CHF, DM, OSA, and ESRD secondary to DM (dialyzes MWF) presented to [facility] for a right arm AV fistula creation. Pt was undergoing procedure when she suffered from a cardiac arrest 10/27. Estimated down time 4-6 minutes, minimal. Procedure aborted.   STUDIES:  CT head >> caudal half of brain not visualized, stable with chronic ischemic changes in left hemisphere  SIGNIFICANT EVENTS: 10/27: Pt with cardiac arrest in OR 10/29: new A fib with RVR  SUBJECTIVE / Interval events: Continues to be in NSR no CP or pressure - some tenderness from compressions Denies SOB  VITAL SIGNS: Temp:  [97.9 F (36.6 C)-98.3 F (36.8 C)] 97.9 F (36.6 C) (10/31 0400) Pulse Rate:  [75-120] 77 (10/31 0600) Resp:  [0-29] 14 (10/31 0600) BP: (78-145)/(41-125) 109/51 mmHg (10/31 0600) SpO2:  [97 %-100 %] 99 % (10/31 0600) Weight:  [292 lb 5.3 oz (132.6 kg)] 292 lb 5.3 oz (132.6 kg) (10/31 0400) HEMODYNAMICS: CVP:  [17 mmHg] 17 mmHg VENTILATOR SETTINGS:   INTAKE / OUTPUT:  Intake/Output Summary (Last 24 hours) at 07/12/15 0741 Last data filed at 07/12/15 0700  Gross per 24 hour  Intake 631.31 ml  Output      0 ml  Net 631.31 ml    PHYSICAL EXAMINATION: General: Chronically ill appearing female, NAD, speech clear Neuro: Alert, follows commands, moves all extremities  HEENT: NCAT, extubated, EOMI, PERRL Cardiovascular: RRR, no m/r/g Lungs: Crackles bilaterally  Abdomen: Non-distended, non-tender, +bs , obese Musculoskeletal: Intact, +edema Skin: wnl    LABS:  CBC  Recent Labs Lab 07/10/15 0400 07/11/15 0453 07/12/15 0505  WBC 8.7 8.8 9.8  HGB 9.2* 9.7* 9.7*  HCT 28.0* 31.1* 31.8*  PLT 128* 152 159    Coag's  Recent Labs Lab 07/08/15 1631 07/11/15 1601 07/11/15 2000  INR 1.59* 1.42 1.38   BMET  Recent Labs Lab 07/10/15 0400 07/11/15 0453 07/12/15 0505  NA 134* 135 134*  K 3.5 3.5 3.9  CL 97* 98* 100*  CO2 BUN 20 32* 38*  CREATININE 2.81* 3.52* 3.60*  GLUCOSE 242* 156* 144*   Electrolytes  Recent Labs Lab 07/10/15 0400 07/11/15 0453 07/12/15 0505  CALCIUM 8.5* 9.4 9.3  MG 1.9  --   --   PHOS 3.0  --  5.1*   Sepsis Markers No results for input(s): LATICACIDVEN, PROCALCITON, O2SATVEN in the last 168 hours. ABG  Recent Labs Lab 07/08/15 1543 07/08/15 1727 07/09/15 0250  PHART 7.285* 7.349* 7.440  PCO2ART 36.6 41.3 30.9*  PO2ART 428.0* 483.0* 173.0*   Liver Enzymes  Recent Labs Lab 07/08/15 1631 07/09/15 0445 07/12/15 0505  AST 87* 53*  --   ALT 41 35  --   ALKPHOS 136* 119  --   BILITOT 2.2* 1.3*  --   ALBUMIN 2.8* 2.6* 2.8*   Cardiac Enzymes  Recent Labs Lab 07/10/15 2212 07/10/15 2327 07/11/15 0453  TROPONINI 0.96* 1.05* 1.10*   Glucose  Recent Labs Lab 07/10/15 1636 07/11/15 0740 07/11/15 1747 07/11/15 1930 07/12/15 0054 07/12/15 0431  GLUCAP 286* 114* 214* 260* 178* 155*    Imaging No results found.   ASSESSMENT / PLAN:  PULMONARY OETT 10/27 >> 10/28 A:  Acute hypoxic respiratory failure in the setting of cardiopulmonary arrest Mild pulmonary edema  Obesity OSA/OHS  P:  Extubated 10/28 O2 prn Push pulm hygiene  CARDIOVASCULAR CVL 10/27>> 10/29 new A fib RVR - spontaneously converted to NSR A:  S/p cardiac arrest--PEA; expected down time 4-6 minutes  H/o LBBB H/o HTN  H/o CHF  Pulm HTN 77 P: Heparin gtt - will consider starting warfarin per Cardiology Telemetry Cardiology following Continue low dose metoprolol and plan to increase as she can tolerate over next several days.   RENAL A:  ESRD on dialysis --> last dialysis 10/28 Metabolic acidosis - resolved Hyponatremia P:   Renal following, plan HD MWF Renal dose meds  F/u bmet   GASTROINTESTINAL A:  H/o GERD  P:  Protonix is home med, keep Diet  HEMATOLOGIC A:  Anemia of chronic disease - stable P:  heparin gtt as above, consider to coumadin F/u CBC   INFECTIOUS UCx 10/29 >>NGTD  A:  Possible UTI - given UA with nitrite +, small leuks- afebrile, WBC normal P:  Monitor WBC/fever curve  No abx at this time Likely with chronically dirty UA  ENDOCRINE A:  H/o DM  H/o hypothyroidism    Recent Labs  07/11/15 1930 07/12/15 0054 07/12/15 0431  GLUCAP 260* 178* 155*  P:  CBGs and SSI , continue lantus 40 u BID, home dose 50 u q 12 h Levothyroxine   NEUROLOGIC A:  S/p Cardiac arrest concern for possible hypoxic encephalopathy - improved significantly P:  RASS 1 Awake and alert.intact, follows commands  FAMILY  - Updates: patient updated at beside 10/31  - Inter-disciplinary family meet or Palliative Care meeting due by: 11/3    Erasmo DownerAngela M Bacigalupo, MD, MPH PGY-2,  [facility] 07/12/2015 7:41 AM    STAFF NOTE: Cindi CarbonI, Daniel Feinstein, MD FACP have personally reviewed patient's available data, including medical history, events of note, physical examination and test results as part of my evaluation. I have discussed with resident/NP and other care providers such as pharmacist, RN and RRT. In addition, I personally evaluated patient and elicited key findings of: awake, alert, non distress, BS slight coarse, for HD today, dc line neck if able, move to tele, heparin to coumadin, PA pressure noted, does have osa, ohs, consider autoimmune work up, has MILD MS, will need further follow up, follow BP on HD, for neg balance, likely her PA pressures played a role in arest, will send to triad as primary and stay as pulm consult, osa / ohs by them selves unlikley to explain pa pressures, she needs sleep study   Mcarthur Rossettianiel J. Tyson AliasFeinstein, MD, FACP Pgr:  [phone] [facility] 07/12/2015 8:59 AM

## 2015-07-12 NOTE — Progress Notes (Addendum)
Inpatient Rehabilitation  Patient was screened by Weldon PickingSusan Monika Chestang for appropriateness for an Inpatient Acute Rehab consult.  At this time, we are recommending Inpatient Rehab consult.  Please order when you feel appropriate. Also, note PT is recommending OT consult which would be needed for insurance if pt. deemed appropriate for CIR by rehab MD.  Thanks!    Weldon PickingSusan Hermena Swint PT Inpatient Rehab Admissions Coordinator Cell [phone] Office [phone]

## 2015-07-12 NOTE — Consult Note (Signed)
Physical Medicine and Rehabilitation Consult  Reason for Consult: Debility Referring Physician: Dr. Tyson Alias.    HPI: Kimberly [name] is a 59 y.o. female with history of HTN, COPD, Morbid obesity, DM type 2 with CKD recent progression to dialysis dependence due to volume overload. She was admitted on 07/08/15 for right AVG placement and suffered respiratory arrest with PEA at the beginning of procedure. Estimated down time 4-5 minutes and procedure aborted. She was intubated thorough 10/28 and CT head done due to concerns of possible hypoxic encephalopathy.  CT head showed stable brain parenchyma but caudal half of brain could not be visualized. 2 D echo with EF 55- 60%, grade 2 diastolic dysfunction and severe pulmonary HTN.  She developed A fib with chest pressure on 10/30 but converted to NSR spontaneously.  Dr. Donnie Aho consulted for input and recommended coumadin due to [facility] score 6 and CKD.  Mentation has improved and patient to continue with perm cath for dialysis due to "tiny arteries and veins". PT evaluation done yesterday and CIR recommended for follow up therapy.    Review of Systems  Constitutional: Negative for fever and chills.  HENT: Positive for hearing loss (Left ear) and tinnitus (Left ear).   Respiratory: Positive for shortness of breath.   Cardiovascular: Positive for orthopnea and leg swelling.  Gastrointestinal: Positive for constipation.  Musculoskeletal: Positive for myalgias.  Neurological: Positive for sensory change (Limited sensation below knees b/l and digits 3-5 b/l), weakness and headaches.       Balance problems  All other systems reviewed and are negative.     Past Medical History  Diagnosis Date  . HYPERLIPIDEMIA   . HYPERTENSION   . CAD, NATIVE VESSEL     May 10, 2010 cath showed a hyperdynamic LV function, she had dominant circumflex anatomy with a 70-80% small OM1. She had diffuse diabetic plaque particularly in the distal LAD. She  nondominant RCA.  Nondominant  . PVD     CEA  . COPD   . CHF (congestive heart failure) (HCC)     Preserved EF  . Cellulitis 10/15/2013  . Diabetic neuropathy (HCC)   . Type II diabetes mellitus (HCC)   . Proliferative retinopathy     hx/notes 01/27/2010  . Peripheral neuropathy (HCC)     hx/notes 01/27/2010  . Asthma   . On home oxygen therapy     "2.5L; 24/7" (04/06/2015)  . Pneumonia "several times"  . Chronic bronchitis (HCC)     "often; usually q yr" (04/06/2015)  . Hypothyroidism   . History of hiatal hernia   . GERD (gastroesophageal reflux disease)   . 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 Arthritis     "aches and pains all over" (04/06/2015)  . Anxiety   . Depression   . Paralyzed vocal cords   . Sleep apnea     not on cpap    . Anemia   . End stage renal disease (HCC)     dialysis M-W-F     Past Surgical History  Procedure Laterality Date  . Carotid endarterectomy Left X 2  . Vitrectomy Bilateral   . Cataract extraction w/ intraocular lens  implant, bilateral Bilateral   . Eye surgery      numerous surgeries  . Colonoscopy    . Insertion of dialysis catheter N/A 06/24/2015  Procedure: ULTRASOUND BILATERAL INTERNAL JUGULAR VEIN INSERTION OF DIALYSIS CATHETER LEFT INTERNAL JUGULAR VEIN ;  Surgeon: Pryor OchoaJames D Lawson, MD;  [facility];  Service: Vascular;  Laterality: N/A;  . Av fistula placement Right 07/08/2015    Procedure: EXPLORATION RIGHT AXILLARY ARTERY AND RIGHT BRACHIAL VEIN;  Surgeon: Chuck Hinthristopher S Dickson, MD;  [facility];  Service: Vascular;  Laterality: Right;    Family History  Problem Relation Age of Onset  . Cancer Mother     Breast, NHL  . Stroke Mother   . Peripheral vascular disease Father   . CAD Father 2530  . Heart attack Father   . Hypertension Father   . Asthma Father   . Heart disease Father     before age  60     Social History:  Married. Husband assist with all ADLs, but not available 24/7. She reports that she quit smoking about 9 years ago. Her smoking use included Cigarettes. She has a 20 pack-year smoking history. She has never used smokeless tobacco. She reports that she does not drink alcohol or use illicit drugs.     Allergies  Allergen Reactions  . Ciprofloxacin Itching  . Epinephrine     Increased heart rate    Medications Prior to Admission  Medication Sig Dispense Refill  . aspirin EC 81 MG tablet Take 81 mg by mouth daily.    . insulin aspart (NOVOLOG) 100 UNIT/ML injection Inject 6 Units into the skin 3 (three) times daily with meals.    . insulin glargine (LANTUS) 100 UNIT/ML injection Inject 50 Units into the skin 2 (two) times daily.     Marland Kitchen. levothyroxine (SYNTHROID, LEVOTHROID) 75 MCG tablet Take 75 mcg by mouth daily before breakfast.    . metoprolol succinate (TOPROL-XL) 50 MG 24 hr tablet Take 1 tablet (50 mg total) by mouth daily. Take 1 and 1/2 tabs daily = 75 mg daily (Patient taking differently: Take 50 mg by mouth daily. ) 30 tablet 1  . Multiple Vitamins-Minerals (MULTIVITAMIN PO) Take 1 tablet by mouth daily.    Marland Kitchen. oxyCODONE-acetaminophen (PERCOCET/ROXICET) 5-325 MG tablet Take 1 tablet by mouth every 6 (six) hours as needed. (Patient taking differently: Take 1 tablet by mouth every 6 (six) hours as needed for moderate pain or severe pain. ) 15 tablet 0  . acetaminophen (TYLENOL) 325 MG tablet Take 650 mg by mouth every 6 (six) hours as needed for mild pain.    Marland Kitchen. albuterol (PROVENTIL HFA;VENTOLIN HFA) 108 (90 BASE) MCG/ACT inhaler Inhale 1-2 puffs into the lungs every 6 (six) hours as needed for wheezing or shortness of breath.    . potassium chloride SA (KLOR-CON M20) 20 MEQ tablet Take 2 tablets (40 mEq total) by mouth 2 (two) times daily. (Patient not taking: Reported on 07/07/2015) 360 tablet 3    Home: Home Living Family/patient expects to be discharged to::  Private residence Living Arrangements: Spouse/significant other Available Help at Discharge: Family, Available PRN/intermittently Type of Home: House Home Access: Ramped entrance Home Layout: Multi-level Alternate Level Stairs-Number of Steps: 2 Alternate Level Stairs-Rails: None Bathroom Shower/Tub: Tub/shower unit, Engineer, building servicesCurtain Bathroom Toilet: Handicapped height Home Equipment: Art gallery managerlectric scooter, Wheelchair - manual, Environmental consultantWalker - 2 wheels, Environmental consultantWalker - 4 wheels, Grab bars - toilet, Grab bars - tub/shower, Bankerhower seat  Functional History: Prior Function Level of Independence: Needs assistance ADL's / Homemaking Assistance Needed: husband assists with all tasks Functional Status:  Mobility: Bed Mobility Overal bed mobility: Needs Assistance, +2 for physical assistance Bed  Mobility: Supine to Sit Supine to sit: +2 for physical assistance, +2 for safety/equipment, Max assist General bed mobility comments: Body habitus and abdominal girth lends to inefficient movement with getting to EOB; Physical assist to scoot hip sto EOB and elevate trunk to sitting; 2 person assist to lay back down Transfers Overall transfer level: Needs assistance Equipment used: 2 person hand held assist Transfers: Sit to/from Stand Sit to Stand: Mod assist, +2 safety/equipment General transfer comment: Light mod assist for power up; Used bed pad to support hips with initial sit to stand; Reports of back pain with transfer training, no dizziness sitting up or standing up Ambulation/Gait General Gait Details: Deferred today secondary to back pain and pt request to lay back down    ADL:    Cognition: Cognition Overall Cognitive Status: Within Functional Limits for tasks assessed Orientation Level: Oriented X4 Cognition Arousal/Alertness: Awake/alert Behavior During Therapy: WFL for tasks assessed/performed Overall Cognitive Status: Within Functional Limits for tasks assessed  Blood pressure 136/56, pulse 79,  temperature 97.5 F (36.4 C), temperature source Oral, resp. rate 24, height  (1.626 m), weight 132.6 kg (292 lb 5.3 oz), SpO2 100 %. Physical Exam  Vitals reviewed. Constitutional: She is oriented to person, place, and time. She appears well-developed and well-nourished.  Morbidly obese  HENT:  Head: Normocephalic and atraumatic.  Eyes: Conjunctivae and EOM are normal.  Neck: Normal range of motion. Neck supple.  Cardiovascular: Normal rate and regular rhythm.   Distant heart sounds  Respiratory: Effort normal. No respiratory distress.  Diminished at bases +[place]  GI: She exhibits distension. There is no tenderness. There is no rebound.  Musculoskeletal: She exhibits edema. She exhibits no tenderness.  Strength 4-/5 proximally grossly, 4+/5 distally grossly, except for finger grip (likely sensory)  Neurological: She is alert and oriented to person, place, and time.  Diminished sensation below knees b/l No hearing in left ear Right lean  Skin: Skin is warm and dry.  Psychiatric: She has a normal mood and affect. Her behavior is normal.    Results for orders placed or performed during the hospital encounter of 07/08/15 (from the past 24 hour(s))  Heparin level (unfractionated)     Status: None   Collection Time: 07/11/15  4:00 PM  Result Value Ref Range   Heparin Unfractionated 0.37 0.30 - 0.70 IU/mL  Protime-INR     Status: Abnormal   Collection Time: 07/11/15  4:01 PM  Result Value Ref Range   Prothrombin Time 17.4 (H) 11.6 - 15.2 seconds   INR 1.42 0.00 - 1.49  Glucose, capillary     Status: Abnormal   Collection Time: 07/11/15  5:47 PM  Result Value Ref Range   Glucose-Capillary 214 (H) 65 - 99 mg/dL  Glucose, capillary     Status: Abnormal   Collection Time: 07/11/15  7:30 PM  Result Value Ref Range   Glucose-Capillary 260 (H) 65 - 99 mg/dL  Protime-INR     Status: Abnormal   Collection Time: 07/11/15  8:00 PM  Result Value Ref Range   Prothrombin Time 17.1 (H)  11.6 - 15.2 seconds   INR 1.38 0.00 - 1.49  Glucose, capillary     Status: Abnormal   Collection Time: 07/12/15 12:54 AM  Result Value Ref Range   Glucose-Capillary 178 (H) 65 - 99 mg/dL  Glucose, capillary     Status: Abnormal   Collection Time: 07/12/15  4:31 AM  Result Value Ref Range   Glucose-Capillary 155 (H) 65 -  99 mg/dL  Heparin level (unfractionated)     Status: Abnormal   Collection Time: 07/12/15  5:05 AM  Result Value Ref Range   Heparin Unfractionated 0.20 (L) 0.30 - 0.70 IU/mL  CBC     Status: Abnormal   Collection Time: 07/12/15  5:05 AM  Result Value Ref Range   WBC 9.8 4.0 - 10.5 K/uL   RBC 3.11 (L) 3.87 - 5.11 MIL/uL   Hemoglobin 9.7 (L) 12.0 - 15.0 g/dL   HCT 21.3 (L) 08.6 - 57.8 %   MCV 102.3 (H) 78.0 - 100.0 fL   MCH 31.2 26.0 - 34.0 pg   MCHC 30.5 30.0 - 36.0 g/dL   RDW 46.9 (H) 62.9 - 52.8 %   Platelets 159 150 - 400 K/uL  Renal function panel     Status: Abnormal   Collection Time: 07/12/15  5:05 AM  Result Value Ref Range   Sodium 134 (L) 135 - 145 mmol/L   Potassium 3.9 3.5 - 5.1 mmol/L   Chloride 100 (L) 101 - 111 mmol/L   CO2 25 22 - 32 mmol/L   Glucose, Bld 144 (H) 65 - 99 mg/dL   BUN 38 (H) 6 - 20 mg/dL   Creatinine, Ser 4.13 (H) 0.44 - 1.00 mg/dL   Calcium 9.3 8.9 - 24.4 mg/dL   Phosphorus 5.1 (H) 2.5 - 4.6 mg/dL   Albumin 2.8 (L) 3.5 - 5.0 g/dL   GFR calc non Af Amer 13 (L) >60 mL/min   GFR calc Af Amer 15 (L) >60 mL/min   Anion gap 9 5 - 15  Sedimentation rate     Status: Abnormal   Collection Time: 07/12/15 10:40 AM  Result Value Ref Range   Sed Rate 57 (H) 0 - 22 mm/hr  C-reactive protein     Status: Abnormal   Collection Time: 07/12/15 10:40 AM  Result Value Ref Range   CRP 6.0 (H) <1.0 mg/dL  Glucose, capillary     Status: Abnormal   Collection Time: 07/12/15 12:09 PM  Result Value Ref Range   Glucose-Capillary 123 (H) 65 - 99 mg/dL   No results found.  Assessment/Plan: Diagnosis: Debility secondary to cardiac  arrest 1. Does the need for close, 24 hr/day medical supervision in concert with the patient's rehab needs make it unreasonable for this patient to be served in a less intensive setting? Yes 2. Co-Morbidities requiring supervision/potential complications: HTN (monitor and provide prns in accordance with increased physical exertion and pain), CAD (cont meds), COPD (Monitor for signs of respiratory distress. Provide oxygen with goals 88 - 94% if needed. Augment therapies as needed based off endurance. Provide chest physiotherapy as needed - encourage incentive spirometry/acopella.), CHF (Monitor in accordance with increased physical activity and avoid UE resistance excercises), DM (Monitor in accordance with exercise and adjust meds as necessary), ESRD (Pt on HD, adjust therapies to accommodate dialysis schedule, follow endurance and strength changes surrounding dialysis schedule and work with nephrology to optimize fluid status), tachypnea, anemia (transfuse if necessary to ensure appropriate perfusion for increased activity tolerance), hypothyroidism (ensure adequate energy for therapies), A. Fib (monitor in accordance with increased exercise tolerance), Pul HTN (avoid UE resistance excercises), Dialstolic CHF (Monitor in accordance with increased physical activity and avoid UE resistance excercises), Depression/Anxiety (anxiety (ensure anxiety and resulting apprehension do not limit functional progress; consider prn medications if warranted), morbid obesity (dietician consult, encourage weight loss for improved endurance and overall health) 3. Due to bowel management, safety, skin/wound care, disease management,  medication administration and patient education, does the patient require 24 hr/day rehab nursing? Yes 4. Does the patient require coordinated care of a physician, rehab nurse, PT (1.5-2 hrs/day, 5 days/week) and OT (1.5-2 hrs/day, 5 days/week) to address physical and functional deficits in the context  of the above medical diagnosis(es)? Yes Addressing deficits in the following areas: balance, endurance, locomotion, strength, transferring, bowel/bladder control, bathing, dressing, grooming, toileting and psychosocial support 5. Can the patient actively participate in an intensive therapy program of at least 3 hrs of therapy per day at least 5 days per week? Yes 6. The potential for patient to make measurable gains while on inpatient rehab is good and fair 7. Anticipated functional outcomes upon discharge from inpatient rehab are modified independent, supervision and min assist  with PT, modified independent, supervision and min assist with OT, n/a with SLP. 8. Estimated rehab length of stay to reach the above functional goals is: 14-17 day. 9. Does the patient have adequate social supports and living environment to accommodate these discharge functional goals? Potentially 10. Anticipated D/C setting: Home vs. SNF, based on husband availability 11. Anticipated post D/C treatments: HH vs. SNF 12. Overall Rehab/Functional Prognosis: fair  RECOMMENDATIONS: This patient's condition is appropriate for continued rehabilitative care in the following setting: Pt stating that she has thorough experience with therapy and was receiving HH prior to admission and would like to continue at home.  If pt's husband is not able to provide adequate support, pt will benefit from a longer length of stay at a SNF.   Patient has agreed to participate in recommended program. No Note that insurance prior authorization may be required for reimbursement for recommended care.  Maryla Morrow, MD 07/12/2015

## 2015-07-13 DIAGNOSIS — I2510 Atherosclerotic heart disease of native coronary artery without angina pectoris: Secondary | ICD-10-CM

## 2015-07-13 LAB — CBC
HCT: 31 % — ABNORMAL LOW (ref 36.0–46.0)
HEMATOCRIT: 30.7 % — AB (ref 36.0–46.0)
HEMOGLOBIN: 10 g/dL — AB (ref 12.0–15.0)
HEMOGLOBIN: 9.6 g/dL — AB (ref 12.0–15.0)
MCH: 32.9 pg (ref 26.0–34.0)
MCH: 32.3 pg (ref 26.0–34.0)
MCHC: 32.3 g/dL (ref 30.0–36.0)
MCHC: 31.3 g/dL (ref 30.0–36.0)
MCV: 102 fL — ABNORMAL HIGH (ref 78.0–100.0)
MCV: 103.4 fL — AB (ref 78.0–100.0)
PLATELETS: 186 10*3/uL (ref 150–400)
PLATELETS: 158 10*3/uL (ref 150–400)
RBC: 3.04 MIL/uL — AB (ref 3.87–5.11)
RBC: 2.97 MIL/uL — AB (ref 3.87–5.11)
RDW: 20.5 % — ABNORMAL HIGH (ref 11.5–15.5)
RDW: 20.6 % — ABNORMAL HIGH (ref 11.5–15.5)
WBC: 12.4 10*3/uL — ABNORMAL HIGH (ref 4.0–10.5)
WBC: 10.9 10*3/uL — AB (ref 4.0–10.5)

## 2015-07-13 LAB — BASIC METABOLIC PANEL
ANION GAP: 9 (ref 5–15)
BUN: 25 mg/dL — AB (ref 6–20)
CALCIUM: 8.7 mg/dL — AB (ref 8.9–10.3)
CO2: 25 mmol/L (ref 22–32)
Chloride: 97 mmol/L — ABNORMAL LOW (ref 101–111)
Creatinine, Ser: 2.86 mg/dL — ABNORMAL HIGH (ref 0.44–1.00)
GFR calc Af Amer: 20 mL/min — ABNORMAL LOW (ref >60)
GFR calc non Af Amer: 17 mL/min — ABNORMAL LOW (ref >60)
GLUCOSE: 140 mg/dL — AB (ref 65–99)
Potassium: 4 mmol/L (ref 3.5–5.1)
Sodium: 131 mmol/L — ABNORMAL LOW (ref 135–145)

## 2015-07-13 LAB — PROTIME-INR
INR: 1.35 (ref 0.00–1.49)
Prothrombin Time: 16.8 seconds — ABNORMAL HIGH (ref 11.6–15.2)

## 2015-07-13 LAB — RENAL FUNCTION PANEL
ANION GAP: 11 (ref 5–15)
Albumin: 3 g/dL — ABNORMAL LOW (ref 3.5–5.0)
BUN: 30 mg/dL — ABNORMAL HIGH (ref 6–20)
CALCIUM: 9.2 mg/dL (ref 8.9–10.3)
CO2: 26 mmol/L (ref 22–32)
Chloride: 94 mmol/L — ABNORMAL LOW (ref 101–111)
Creatinine, Ser: 3.14 mg/dL — ABNORMAL HIGH (ref 0.44–1.00)
GFR calc non Af Amer: 15 mL/min — ABNORMAL LOW (ref >60)
GFR, EST AFRICAN AMERICAN: 17 mL/min — AB (ref >60)
GLUCOSE: 171 mg/dL — AB (ref 65–99)
PHOSPHORUS: 3.9 mg/dL (ref 2.5–4.6)
POTASSIUM: 4.1 mmol/L (ref 3.5–5.1)
Sodium: 131 mmol/L — ABNORMAL LOW (ref 135–145)

## 2015-07-13 LAB — GLUCOSE, CAPILLARY
GLUCOSE-CAPILLARY: 129 mg/dL — AB (ref 65–99)
Glucose-Capillary: 172 mg/dL — ABNORMAL HIGH (ref 65–99)
Glucose-Capillary: 162 mg/dL — ABNORMAL HIGH (ref 65–99)
Glucose-Capillary: 170 mg/dL — ABNORMAL HIGH (ref 65–99)

## 2015-07-13 LAB — C3 COMPLEMENT: C3 Complement: 99 mg/dL (ref 82–167)

## 2015-07-13 LAB — C4 COMPLEMENT: COMPLEMENT C4, BODY FLUID: 22 mg/dL (ref 14–44)

## 2015-07-13 LAB — ANTINUCLEAR ANTIBODIES, IFA: ANA Ab, IFA: NEGATIVE

## 2015-07-13 LAB — RHEUMATOID FACTOR: Rhuematoid fact SerPl-aCnc: <10 IU/mL (ref 0.0–13.9)

## 2015-07-13 LAB — HEPARIN LEVEL (UNFRACTIONATED): HEPARIN UNFRACTIONATED: 0.4 [IU]/mL (ref 0.30–0.70)

## 2015-07-13 MED ORDER — WARFARIN VIDEO: Freq: Once | Status: DC

## 2015-07-13 MED ORDER — SODIUM CHLORIDE 0.9 % IV SOLN: 510.0000 mg | INTRAVENOUS | Status: DC

## 2015-07-13 MED ORDER — WARFARIN SODIUM 3 MG PO TABS
6.0000 mg | ORAL_TABLET | Freq: Once | ORAL | Status: AC
Start: 2015-07-13 — End: 2015-07-13
  Administered 2015-07-13: 6 mg via ORAL
  Filled 2015-07-13: qty 2

## 2015-07-13 MED ORDER — PATIENT'S GUIDE TO USING COUMADIN BOOK
Freq: Once | Status: AC
  Administered 2015-07-13: 10:00:00
  Filled 2015-07-13: qty 1

## 2015-07-13 NOTE — Progress Notes (Signed)
[facility] Progress Note   Subjective: no complaints. 3.7kg off w HD yest  Filed Vitals:   07/13/15 0142 07/13/15 0458 07/13/15 0806 07/13/15 1136  BP: 122/51 114/58 135/62 127/61  Pulse: 88 83 84 86  Temp: 97.7 F (36.5 C) 98.6 F (37 C) 98.4 F (36.9 C)   TempSrc: Oral Oral Oral   Resp: 21 20 20 20   Height:      Weight:  132.8 kg (292 lb 12.3 oz)    SpO2: 93% 97% 94% 95%   Exam: Alert, no distress, morbidly obese No jvd Chest dec'd R base, L clear RRR no MRG Abd obese, ntnd +bs Ext diffuse 3-4+ tight pitting edema of bilat LE's, lower abd wall Neuro is alert, Ox 3   MWF [facility]   131kg  2/2 Bath  Heparin 6000   IJ cath Mircera 75 q 2 weeks last10/19- last hgb 10.4- also venofer 100 q tx.through 11/4   No PTH- calc 9.8, phos 4.5       Assessment: 1 Massive vol overload 2 PEA arrest 10/27 3 ESRD recent start mwf ,using cath 4 Afib - new, back in NSR.  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 8 Vasc access- prob not candidate for perm access per [facility] 9 COPD/ [facility] home O2 10 DM2  Plan - continue daily HD for volume   Vinson Moselleob Francely Craw MD [facility] pager [phone]    cell [phone] 07/13/2015, 12:42 PM    Recent Labs Lab 07/10/15 0400  07/12/15 0505 07/12/15 1410 07/13/15 0510  NA 134*  < > 134* 135 131*  K 3.5  < > 3.9 3.9 4.0  CL 97*  < > 100* 99* 97*  CO2 25  < > 25 25 25   GLUCOSE 242*  < > 144* 125* 140*  BUN 20  < > 38* 41* 25*  CREATININE 2.81*  < > 3.60* 3.73* 2.86*  CALCIUM 8.5*  < > 9.3 9.4 8.7*  PHOS 3.0  --  5.1* 5.3*  --   < > = values in this interval not displayed.  Recent Labs Lab 07/08/15 1631 07/09/15 0445 07/12/15 0505 07/12/15 1410  AST 87* 53*  --   --   ALT 41 35  --   --   ALKPHOS 136* 119  --   --   BILITOT 2.2* 1.3*  --   --   PROT 6.6 6.6  --   --   ALBUMIN 2.8* 2.6* 2.8* 2.9*    Recent Labs Lab 07/12/15 0505 07/12/15 1409 07/13/15 0510  WBC 9.8 9.6 12.4*   NEUTROABS  --  7.3  --   HGB 9.7* 9.9* 10.0*  HCT 31.8* 31.5* 31.0*  MCV 102.3* 102.6* 102.0*  PLT 159 172 186   . antiseptic oral rinse  7 mL Mouth Rinse QID  . aspirin EC  81 mg Oral Daily  . chlorhexidine gluconate  15 mL Mouth Rinse BID  . Chlorhexidine Gluconate Cloth  6 each Topical Q0600  . darbepoetin (ARANESP) injection - DIALYSIS  100 mcg Intravenous Q Mon-HD  . docusate sodium  100 mg Oral BID  . ferric gluconate (FERRLECIT/NULECIT) IV  125 mg Intravenous Q M,W,F-HD  . heparin  5,000 Units Dialysis Once in dialysis  . [START ON 07/14/2015] heparin  6,000 Units Dialysis Once in dialysis  . insulin aspart  0-15 Units Subcutaneous TID WC  . insulin glargine  40 Units Subcutaneous BID  .  levothyroxine  75 mcg Oral QAC breakfast  . metoprolol tartrate  12.5 mg Oral BID  . multivitamin  1 tablet Oral QHS  . mupirocin ointment  1 application Nasal BID  . pantoprazole  40 mg Oral Daily  . warfarin  6 mg Oral ONCE-1800  . warfarin   Does not apply Once  . Warfarin - Pharmacist Dosing Inpatient   Does not apply q1800   . sodium chloride Stopped (07/12/15 1100)  . heparin 1,550 Units/hr (07/12/15 1424)   sodium chloride, sodium chloride, acetaminophen **OR** acetaminophen, albuterol, alteplase, fentaNYL (SUBLIMAZE) injection, heparin, hydrALAZINE, lidocaine (PF), metoprolol, pentafluoroprop-tetrafluoroeth, phenol

## 2015-07-13 NOTE — Progress Notes (Signed)
ANTICOAGULATION CONSULT NOTE - Follow Up Consult  Pharmacy Consult for Heparin and warfarin Indication: atrial fibrillation  Allergies  Allergen Reactions  . Ciprofloxacin Itching  . Epinephrine     Increased heart rate    Patient Measurements: Height: 5\' 4"  (162.6 cm) Weight: 292 lb 12.3 oz (132.8 kg) IBW/kg (Calculated) : 54.7  Vital Signs: Temp: 98.4 F (36.9 C) (11/01 0806) Temp Source: Oral (11/01 0806) BP: 135/62 mmHg (11/01 0806) Pulse Rate: 84 (11/01 0806)  Labs:  Recent Labs  07/10/15 2212 07/10/15 2327  07/11/15 0453  07/11/15 1601 07/11/15 2000 07/12/15 0505 07/12/15 1409 07/12/15 1410 07/12/15 1928 07/13/15 0510  HGB  --   --   < > 9.7*  --   --   --  9.7* 9.9*  --   --  10.0*  HCT  --   --   < > 31.1*  --   --   --  31.8* 31.5*  --   --  31.0*  PLT  --   --   < > 152  --   --   --  159 172  --   --  186  LABPROT  --   --   --   --   --  17.4* 17.1*  --   --   --   --  16.8*  INR  --   --   --   --   --  1.42 1.38  --   --   --   --  1.35  HEPARINUNFRC  --   --   --   --   < >  --   --  0.20*  --   --  0.67 0.40  CREATININE  --   --   < > 3.52*  --   --   --  3.60*  --  3.73*  --  2.86*  TROPONINI 0.96* 1.05*  --  1.10*  --   --   --   --   --   --   --   --   < > = values in this interval not displayed.  Estimated Creatinine Clearance: 28.4 mL/min (by C-G formula based on Cr of 2.86).   Assessment: Patient has afib. No anticoagulation PTA - CHADSVASC at least 6.  Heparin level therapeutic this AM INR = 1.35  Goal of Therapy:  Heparin level 0.3-0.7 units/ml Monitor platelets by anticoagulation protocol: Yes  INR = 2 to 3   Plan:  Continue heparin 1550 units/hr Coumadin 6 mg po x 1 dose at 1800 pm Follow up AM labs  Thank you Okey RegalLisa Bastion Bolger, PharmD [phone] 07/13/2015 9:52 AM

## 2015-07-13 NOTE — Progress Notes (Signed)
PT Cancellation Note  Patient Details Name: Kimberly [name] MRN: [id number] DOB: 1956-01-13   Cancelled Treatment:    Reason Eval/Treat Not Completed: Fatigue/lethargy limiting ability to participate. Noted pt just worked with OT (ending at 16:12) and pt very fatigued at that time and unable to transfer with 1 person assist. Will attempt to see at a later date with 2 person assist (as schedules permit)   Melba Araki 07/13/2015, 4:33 PM  Pager [phone]

## 2015-07-13 NOTE — Progress Notes (Signed)
Patient Name: Kimberly [name] Date of Encounter: 07/13/2015  Active Problems:   CAD (coronary artery disease), native coronary artery   Chronic diastolic CHF (congestive heart failure) (HCC)   Mitral valve stenosis, moderate   Pulmonary HTN (HCC)   Morbid obesity (HCC)   ESRD (end stage renal disease) (HCC)   Paroxysmal atrial fibrillation (HCC)   Altered mental status   Pulmonary arterial hypertension (HCC)   Acute on chronic diastolic congestive heart failure (HCC)   Mitral stenosis   Length of Stay: 5  SUBJECTIVE  Optimistic. Feels well. On O2 at rest. Very deconditioned due to non-weight bearing status even before this hospitalization.  CURRENT MEDS . antiseptic oral rinse  7 mL Mouth Rinse QID  . aspirin EC  81 mg Oral Daily  . chlorhexidine gluconate  15 mL Mouth Rinse BID  . Chlorhexidine Gluconate Cloth  6 each Topical Q0600  . darbepoetin (ARANESP) injection - DIALYSIS  100 mcg Intravenous Q Mon-HD  . docusate sodium  100 mg Oral BID  . ferric gluconate (FERRLECIT/NULECIT) IV  125 mg Intravenous Q M,W,F-HD  . heparin  5,000 Units Dialysis Once in dialysis  . [START ON 07/14/2015] heparin  6,000 Units Dialysis Once in dialysis  . insulin aspart  0-15 Units Subcutaneous TID WC  . insulin glargine  40 Units Subcutaneous BID  . levothyroxine  75 mcg Oral QAC breakfast  . metoprolol tartrate  12.5 mg Oral BID  . multivitamin  1 tablet Oral QHS  . mupirocin ointment  1 application Nasal BID  . pantoprazole  40 mg Oral Daily  . warfarin  6 mg Oral ONCE-1800  . warfarin   Does not apply Once  . Warfarin - Pharmacist Dosing Inpatient   Does not apply q1800    OBJECTIVE   Intake/Output Summary (Last 24 hours) at 07/13/15 1103 Last data filed at 07/13/15 0918  Gross per 24 hour  Intake  735.5 ml  Output   3920 ml  Net -3184.5 ml   Filed Weights   07/12/15 1319 07/12/15 1741 07/13/15 0458  Weight: 301 lb 2.4 oz (136.6 kg) 293 lb 14 oz (133.3 kg) 292 lb 12.3 oz  (132.8 kg)    PHYSICAL EXAM Filed Vitals:   07/12/15 2100 07/13/15 0142 07/13/15 0458 07/13/15 0806  BP: 115/48 122/51 114/58 135/62  Pulse: 84 88 83 84  Temp: 98.7 F (37.1 C) 97.7 F (36.5 C) 98.6 F (37 C) 98.4 F (36.9 C)  TempSrc: Oral Oral Oral Oral  Resp: Height:      Weight:   292 lb 12.3 oz (132.8 kg)   SpO2: 93% 93% 97% 94%   General: Alert, oriented x3, no distress, morbid obesity precludes many parts of the exam. Seen during HD. Head: no evidence of trauma, PERRL, EOMI, no exophtalmos or lid lag, no myxedema, no xanthelasma; normal ears, nose and oropharynx Neck: unable to see jugular venous pulsations or hepatojugular reflux; brisk carotid pulses without delay and no carotid bruits Chest: clear to auscultation, no signs of consolidation by percussion or palpation, normal fremitus, symmetrical and full respiratory excursions Cardiovascular: unable to find the apical impulse, regular rhythm, normal first and second heart sounds, no rubs or gallops, no murmur Abdomen: no tenderness or distention, no masses by palpation, no abnormal pulsatility or arterial bruits, normal bowel sounds, no hepatosplenomegaly Extremities: no clubbing, cyanosis; massive bilateral calf edema; 2+ radial, ulnar and brachial pulses bilaterally; unable to palpate pulses in lower extremities. Neurological:  grossly nonfocal  LABS  CBC  Recent Labs  07/12/15 1409 07/13/15 0510  WBC 9.6 12.4*  NEUTROABS 7.3  --   HGB 9.9* 10.0*  HCT 31.5* 31.0*  MCV 102.6* 102.0*  PLT 172 186   Basic Metabolic Panel  Recent Labs  07/12/15 0505 07/12/15 1410 07/13/15 0510  NA 134* 135 131*  K 3.9 3.9 4.0  CL 100* 99* 97*  CO2 25 25 25   GLUCOSE 144* 125* 140*  BUN 38* 41* 25*  CREATININE 3.60* 3.73* 2.86*  CALCIUM 9.3 9.4 8.7*  PHOS 5.1* 5.3*  --    Liver Function Tests  Recent Labs  07/12/15 0505 07/12/15 1410  ALBUMIN 2.8* 2.9*   No results for input(s): LIPASE, AMYLASE in  the last 72 hours. Cardiac Enzymes  Recent Labs  07/10/15 2212 07/10/15 2327 07/11/15 0453  TROPONINI 0.96* 1.05* 1.10*   Radiology Studies Imaging results have been reviewed and No results found.  TELE NSR   ASSESSMENT AND PLAN  1. Cardiopulmonary arrest - respiratory arrest clearly preceded cardiac arrest (PEA). She received very little sedation before her local anesthetic was administered (1 mg Versed + Propofol 25 mcg/kg/min for a few minutes), but has had problems with exaggerated response to sedation before.  2. Paroxysmal atrial fibrillation - she has frequently had sustained irregular palpitations at home before the current diagnostic arrhythmia. High risk for embolic stroke. Plan long term warfarin with INR 2-3. Has had a previous stroke. All major stroke risk factors are present except age, CHADS Vasc 6. Awaiting SNF. Warfarin can be titrated there. In NSR. It is not critical that she is perfectly anticoagulated with warfarin before she transfers there. We do need to arrange INR monitoring/Coumadin clinic f/u after she leaves SNF.  3. Diastolic HF, acute on chronic - now on HD. One year ago, "dry weight" was estimated at 250 lb or so. Now >40 lb above that. Still hypervolemic, but weight down 9 lb since yesterday, via HD volume removal. No respiratory difficulty at rest.  4. Severe pulmonary artery HTN - at cath 2015 was mostly due to pulmonary venous HTN (diastolic LV dysfunction +/- mild mitral stenosis), but there was also a component of intrinsic pulmonary arteriolar disease (obesity, OSA, COPD).  5. ESRD. Currently grossly hypervolemic, HD may be limited by BP, especially low DBP. Can give beta blocker after HD or use 1/2 dose predialysis if this is a problem.  Thurmon FairMihai Yariel Ferraris, MD, [facility] [phone] office [phone] pager 07/13/2015 11:03 AM

## 2015-07-13 NOTE — Progress Notes (Signed)
Pt saying she had some vaginal bleeding over night.  MD notified and no new orders given.  Will continue to monitor.

## 2015-07-13 NOTE — Progress Notes (Signed)
I met with pt at bedside to discuss her rehab venue options. Spouse works and comes home intermittently during the day to transport pt to outpt dialysis. She does not have 24/7 assist at home. I discussed the need for expected assist at home after a short inpt rehab stay and this is unavailable to her. Therefore.we recommend SNF rehab. Pt is in agreement but does not want [facility] or [facility] which she has been to previously. She has [facility] which she pays COBRA while she waits her Medicare to start. She was receiving OP HD pta for about 3 weeks. I will alert RN CM and SW. We will sign off. [phone]

## 2015-07-13 NOTE — Discharge Summary (Signed)
[facility] PROGRESS NOTE   Kimberly [name] WUJ:[id number] DOB: 1955/12/22 DOA: 07/08/2015 PCP: [name], MD  Brief history:  59 year old with past medical history of ESRD started on dialysis about 3 weeks ago through tunneled catheter, presented to the hospital for creation of AV fistula, patient developed cardiac arrest and admitted to the ICU. Transferred on 10/31st for the [facility] were further evaluation. Stay getting daily dialysis for massive fluid overload, continue PT home when is okay with nephrology.  HPI/Subjective: Denies any fever or chills, denies any other complaints. Reported that she did not sleep well.  Assessment/Plan: Active Problems:   CAD (coronary artery disease), native coronary artery   Chronic diastolic CHF (congestive heart failure) (HCC)   Mitral valve stenosis, moderate   Pulmonary HTN (HCC)   Morbid obesity (HCC)   ESRD (end stage renal disease) (HCC)   Paroxysmal atrial fibrillation (HCC)   Altered mental status   Pulmonary arterial hypertension (HCC)   Acute on chronic diastolic congestive heart failure (HCC)   Mitral stenosis    Cardiopulmonary arrest Patient presented to the hospital after she had cardiopulmonary arrest during AV fistula creation. Per cardiology this appears to be respiratory arrest followed by PEA. 2-D echocardiogram showed LVEF of 55-60% with grade 2 diastolic dysfunction. Telemetry did not show any evidence of arrhythmia or ectopy.  Paroxysmal atrial fibrillation Telemetry showed atrial fibrillation, currently on sinus rhythm. CHA2DS2-VASc is 4. Cardiology recommended anticoagulation is started on oral Coumadin, currently on IV heparin as well. Per cardiology she can be discharged if needed before INR is therapeutic.  ESRD Patient recently started on dialysis, getting dialysis from tunneled catheter. Unsuccessful attempt of placement of permanent HD access secondary to the cardiopulmonary  arrest. Patient has massive fluid overload per nephrology, they are continuing daily dialysis.  Severe pulmonary arterial hypertension Cardiac catheterization in 2015 showed pulmonary venous hypertension. The pulmonary artery pressure is about 78 mmHg in the last 2-D echo. This is likely secondary to obesity, OSA and chronic congestion from ESRD (started on dialysis in the past 3 weeks).  Disposition Patient seen my PT recommended CIR, consulted and they recommended either home or SNF. At baseline patient uses walker, ask CSW to evaluate for SNF if not need to maximize home health services.  Code Status: Full Code Family Communication: Plan discussed with the patient. Disposition Plan: Remains inpatient Diet: Diet renal/carb modified with fluid restriction Diet-HS Snack?: Nothing; Room service appropriate?: Yes; Fluid consistency:: Thin  Consultants:  Nephrology.  Cardiology  Procedures:  None  Antibiotics:  None   Objective: Filed Vitals:   07/13/15 1136  BP: 127/61  Pulse: 86  Temp:   Resp: 20    Intake/Output Summary (Last 24 hours) at 07/13/15 1325 Last data filed at 07/13/15 0918  Gross per 24 hour  Intake    720 ml  Output   3920 ml  Net  -3200 ml   Filed Weights   07/12/15 1319 07/12/15 1741 07/13/15 0458  Weight: 136.6 kg (301 lb 2.4 oz) 133.3 kg (293 lb 14 oz) 132.8 kg (292 lb 12.3 oz)    Exam: General: Alert and awake, oriented x3, not in any acute distress. HEENT: anicteric sclera, pupils reactive to light and accommodation, EOMI CVS: S1-S2 clear, no murmur rubs or gallops Chest: clear to auscultation bilaterally, no wheezing, rales or rhonchi Abdomen: soft nontender, nondistended, normal bowel sounds, no organomegaly Extremities: no cyanosis, clubbing or edema noted bilaterally Neuro: Cranial nerves II-XII intact, no focal neurological deficits  Data Reviewed: Basic Metabolic Panel:  Recent Labs Lab 07/10/15 0400 07/11/15 0453  07/12/15 0505 07/12/15 1410 07/13/15 0510  NA 134* 135 134* 135 131*  K 3.5 3.5 3.9 3.9 4.0  CL 97* 98* 100* 99* 97*  CO2 25 26 25 25 25   GLUCOSE 242* 156* 144* 125* 140*  BUN 20 32* 38* 41* 25*  CREATININE 2.81* 3.52* 3.60* 3.73* 2.86*  CALCIUM 8.5* 9.4 9.3 9.4 8.7*  MG 1.9  --   --   --   --   PHOS 3.0  --  5.1* 5.3*  --    Liver Function Tests:  Recent Labs Lab 07/08/15 1631 07/09/15 0445 07/12/15 0505 07/12/15 1410  AST 87* 53*  --   --   ALT 41 35  --   --   ALKPHOS 136* 119  --   --   BILITOT 2.2* 1.3*  --   --   PROT 6.6 6.6  --   --   ALBUMIN 2.8* 2.6* 2.8* 2.9*   No results for input(s): LIPASE, AMYLASE in the last 168 hours. No results for input(s): AMMONIA in the last 168 hours. CBC:  Recent Labs Lab 07/10/15 0400 07/11/15 0453 07/12/15 0505 07/12/15 1409 07/13/15 0510  WBC 8.7 8.8 9.8 9.6 12.4*  NEUTROABS  --   --   --  7.3  --   HGB 9.2* 9.7* 9.7* 9.9* 10.0*  HCT 28.0* 31.1* 31.8* 31.5* 31.0*  MCV 99.3 101.3* 102.3* 102.6* 102.0*  PLT 128* 152 159 172 186   Cardiac Enzymes:  Recent Labs Lab 07/08/15 1502  07/10/15 1055 07/10/15 1630 07/10/15 2212 07/10/15 2327 07/11/15 0453  CKTOTAL 33*  --   --   --   --   --   --   CKMB 2.2  --   --   --   --   --   --   TROPONINI 0.07*  < > 0.08* 0.33* 0.96* 1.05* 1.10*  < > = values in this interval not displayed. BNP (last 3 results)  Recent Labs  04/06/15 1413 04/08/15 0526  BNP 510.5* 127.0*    ProBNP (last 3 results) No results for input(s): PROBNP in the last 8760 hours.  CBG:  Recent Labs Lab 07/12/15 0431 07/12/15 0757 07/12/15 1209 07/12/15 2223 07/13/15 0621  GLUCAP 155* 113* 123* 125* 129*    Micro Recent Results (from the past 240 hour(s))  Culture, Urine     Status: None   Collection Time: 07/08/15  5:05 PM  Result Value Ref Range Status   Specimen Description URINE, RANDOM  Final   Special Requests ADDED [id number]  Final   Culture NO GROWTH 1 DAY  Final    Report Status 07/10/2015 FINAL  Final     Studies: No results found.  Scheduled Meds: . antiseptic oral rinse  7 mL Mouth Rinse QID  . aspirin EC  81 mg Oral Daily  . chlorhexidine gluconate  15 mL Mouth Rinse BID  . Chlorhexidine Gluconate Cloth  6 each Topical Q0600  . darbepoetin (ARANESP) injection - DIALYSIS  100 mcg Intravenous Q Mon-HD  . docusate sodium  100 mg Oral BID  . ferric gluconate (FERRLECIT/NULECIT) IV  125 mg Intravenous Q M,W,F-HD  . heparin  5,000 Units Dialysis Once in dialysis  . [START ON 07/14/2015] heparin  6,000 Units Dialysis Once in dialysis  . insulin aspart  0-15 Units Subcutaneous TID WC  . insulin glargine  40 Units Subcutaneous BID  .  levothyroxine  75 mcg Oral QAC breakfast  . metoprolol tartrate  12.5 mg Oral BID  . multivitamin  1 tablet Oral QHS  . mupirocin ointment  1 application Nasal BID  . pantoprazole  40 mg Oral Daily  . warfarin  6 mg Oral ONCE-1800  . warfarin   Does not apply Once  . Warfarin - Pharmacist Dosing Inpatient   Does not apply q1800   Continuous Infusions: . sodium chloride Stopped (07/12/15 1100)  . heparin 1,550 Units/hr (07/12/15 1424)       Time spent: 35 minutes    [facility] A  [facility] Pager [phone] If 7PM-7AM, please contact night-coverage at [email or web address], password [facility] 07/13/2015, 1:25 PM  LOS: 5 days

## 2015-07-13 NOTE — Evaluation (Signed)
Occupational Therapy Evaluation Patient Details Name: Kimberly ManilaSusan K Borges MRN: [id number] DOB: 04/03/1956 Today's Date: 07/13/2015    History of Present Illness [age] yof with a PMH of HTN, HLD, CAD, COPD, CHF, DM, OSA, and ESRD secondary to DM (dialyzes MWF) [facility]. Pt was undergoing procedure when she suffered from a cardiac arrest 10/27. Estimated down time 4-6 minutes, minimal. Procedure aborted.    Clinical Impression   Pt admitted with above. She demonstrates the below listed deficits and will benefit from continued OT to maximize safety and independence with BADLs.  Pt presents to OT with generalized weakness.   Currently, she demonstrates limited activity tolerance and requires mod - total A overall for ADLs.  Recommend SNF       Follow Up Recommendations  SNF    Equipment Recommendations  None recommended by OT    Recommendations for Other Services       Precautions / Restrictions Precautions Precautions: Fall      Mobility Bed Mobility Overal bed mobility: Needs Assistance Bed Mobility: Supine to Sit;Sit to Supine     Supine to sit: Min assist Sit to supine: Max assist   General bed mobility comments: Assist to move LEs off bed and assist to lift them onto bed and for repositioning in bed   Transfers                 General transfer comment: Pt unable to tolerate     Balance Overall balance assessment: Needs assistance Sitting-balance support: Feet supported Sitting balance-Leahy Scale: Poor Sitting balance - Comments: Pt sat EOB x ~5 mins frequently losing balance to Rt.  Postural control: Right lateral lean                                  ADL Overall ADL's : Needs assistance/impaired Eating/Feeding: Independent;Bed level   Grooming: Wash/dry hands;Wash/dry face;Oral care;Brushing hair;Set up;Bed level   Upper Body Bathing: Moderate assistance;Bed level   Lower Body Bathing: Total  assistance;Bed level   Upper Body Dressing : Maximal assistance;Bed level   Lower Body Dressing: Total assistance;Bed level   Toilet Transfer: Total assistance Toilet Transfer Details (indicate cue type and reason): unable  Toileting- Clothing Manipulation and Hygiene: Total assistance;Bed level         General ADL Comments: Pt only able to tolerate EOB sitting due to fatigue and generalized pain      Vision     Perception     Praxis      Pertinent Vitals/Pain Pain Assessment: Faces Faces Pain Scale: Hurts little more Pain Location: generalized Pain Descriptors / Indicators: Grimacing Pain Intervention(s): Monitored during session;Limited activity within patient's tolerance     Hand Dominance Right   Extremity/Trunk Assessment Upper Extremity Assessment Upper Extremity Assessment: Generalized weakness   Lower Extremity Assessment Lower Extremity Assessment: Defer to PT evaluation   Cervical / Trunk Assessment Cervical / Trunk Assessment: Normal   Communication Communication Communication: No difficulties   Cognition Arousal/Alertness: Awake/alert Behavior During Therapy: Anxious Overall Cognitive Status: Within Functional Limits for tasks assessed                     General Comments       Exercises       Shoulder Instructions      Home Living Family/patient expects to be discharged to:: Inpatient rehab Living Arrangements: Spouse/significant other  Available Help at Discharge: Family;Available PRN/intermittently Type of Home: House Home Access: Ramped entrance     Home Layout: Multi-level Alternate Level Stairs-Number of Steps: 2 Alternate Level Stairs-Rails: None Bathroom Shower/Tub: Tub/shower unit;Curtain   Bathroom Toilet: Handicapped height     Home Equipment: ElectArt gallery managerheelchair - [employer] - 2 wheels;Walker - 4 wheels;Grab bars - toilet;Grab bars - tub/shower;Shower seat          Prior Functioning/Environment  Level of Independence: Needs assistance  Gait / Transfers Assistance Needed: pt reports being unable to ambulate currently and needs assistance with transfers ADL's / Homemaking Assistance Needed: husband assists with all tasks        OT Diagnosis: Generalized weakness;Acute pain   OT Problem List: Decreased strength;Decreased activity tolerance;Impaired balance (sitting and/or standing);Obesity;Pain   OT Treatment/Interventions: Self-care/ADL training;Therapeutic exercise;DME and/or AE instruction;Therapeutic activities;Patient/family education;Balance training    OT Goals(Current goals can be found in the care plan section) Acute Rehab OT Goals Patient Stated Goal: to get stronger OT Goal Formulation: With patient Time For Goal Achievement: 07/27/15 Potential to Achieve Goals: Good ADL Goals Pt Will Perform Grooming: with set-up;with supervision;sitting (unsupported) Pt Will Perform Upper Body Bathing: with min assist;sitting (unsupported) Pt Will Transfer to Toilet: with min assist;stand pivot transfer;bedside commode Pt/caregiver will Perform Home Exercise Program: Increased strength;Both right and left upper extremity;With theraband;Independently;With written HEP provided  OT Frequency: Min 2X/week   Barriers to D/C: Decreased caregiver support          Co-evaluation              End of Session Equipment Utilized During Treatment: Oxygen Nurse Communication: Mobility status  Activity Tolerance: Patient limited by fatigue Patient left: in bed;with call bell/phone within reach   Time: 1610-9604 OT Time Calculation (min): 17 min Charges:  OT General Charges $OT Visit: 1 Procedure OT Evaluation $Initial OT Evaluation Tier I: 1 Procedure G-Codes:    Jeani Hawking M 2015-07-23, 4:22 PM

## 2015-07-13 NOTE — Progress Notes (Signed)
Vaginal bleeding noted when helping to beside commode, denies pain or discomfort, on call paged waiting on response, will continue to monitor patient.

## 2015-07-14 ENCOUNTER — Inpatient Hospital Stay (HOSPITAL_COMMUNITY): Payer: Managed Care, Other (non HMO)

## 2015-07-14 DIAGNOSIS — I5032 Chronic diastolic (congestive) heart failure: Secondary | ICD-10-CM

## 2015-07-14 LAB — CBC
HEMATOCRIT: 33.1 % — AB (ref 36.0–46.0)
HEMOGLOBIN: 10.6 g/dL — AB (ref 12.0–15.0)
MCH: 32.2 pg (ref 26.0–34.0)
MCHC: 32 g/dL (ref 30.0–36.0)
MCV: 100.6 fL — AB (ref 78.0–100.0)
Platelets: 143 10*3/uL — ABNORMAL LOW (ref 150–400)
RBC: 3.29 MIL/uL — AB (ref 3.87–5.11)
RDW: 20.4 % — ABNORMAL HIGH (ref 11.5–15.5)
WBC: 12.5 10*3/uL — AB (ref 4.0–10.5)

## 2015-07-14 LAB — GLUCOSE, CAPILLARY
GLUCOSE-CAPILLARY: 108 mg/dL — AB (ref 65–99)
GLUCOSE-CAPILLARY: 140 mg/dL — AB (ref 65–99)
GLUCOSE-CAPILLARY: 166 mg/dL — AB (ref 65–99)
Glucose-Capillary: 100 mg/dL — ABNORMAL HIGH (ref 65–99)

## 2015-07-14 LAB — HEPARIN LEVEL (UNFRACTIONATED): HEPARIN UNFRACTIONATED: 0.44 [IU]/mL (ref 0.30–0.70)

## 2015-07-14 MED ORDER — HEPARIN SODIUM (PORCINE) 1000 UNIT/ML DIALYSIS
4000.0000 [IU] | Freq: Once | INTRAMUSCULAR | Status: AC
  Administered 2015-07-14: 4000 [IU] via INTRAVENOUS_CENTRAL
  Filled 2015-07-14: qty 4

## 2015-07-14 MED ORDER — HEPARIN SODIUM (PORCINE) 1000 UNIT/ML DIALYSIS
1000.0000 [IU] | INTRAMUSCULAR | Status: DC | PRN
  Filled 2015-07-14: qty 1

## 2015-07-14 MED ORDER — SODIUM CHLORIDE 0.9 % IV SOLN: 100.0000 mL | INTRAVENOUS | Status: DC | PRN

## 2015-07-14 MED ORDER — WARFARIN SODIUM 3 MG PO TABS: 6.0000 mg | ORAL_TABLET | Freq: Once | ORAL | Status: DC

## 2015-07-14 MED ORDER — LIDOCAINE HCL (PF) 1 % IJ SOLN: 5.0000 mL | INTRAMUSCULAR | Status: DC | PRN

## 2015-07-14 MED ORDER — ALTEPLASE 2 MG IJ SOLR
2.0000 mg | Freq: Once | INTRAMUSCULAR | Status: AC | PRN
  Administered 2015-07-14: 2 mg
  Filled 2015-07-14 (×3): qty 2

## 2015-07-14 MED ORDER — PENTAFLUOROPROP-TETRAFLUOROETH EX AERO: 1.0000 "application " | INHALATION_SPRAY | CUTANEOUS | Status: DC | PRN

## 2015-07-14 MED ORDER — LIDOCAINE-PRILOCAINE 2.5-2.5 % EX CREA
1.0000 "application " | TOPICAL_CREAM | CUTANEOUS | Status: DC | PRN
  Filled 2015-07-14: qty 5

## 2015-07-14 NOTE — Progress Notes (Signed)
PT Cancellation Note  Patient Details Name: Kimberly [name] MRN: [id number] DOB: 1955/09/27   Cancelled Treatment:    Reason Eval/Treat Not Completed: Patient at procedure or test/unavailable. Pt off of the floor for ultrasound.  Will check back as schedule permits.   [name],[name] 07/14/2015, 12:02 PM

## 2015-07-14 NOTE — Progress Notes (Addendum)
[facility]                                                                              Patient Demographics  Kimberly [name], is a 59 y.o. female, DOB - 1956-05-08, ZOX:[id number]  Admit date - 07/08/2015   Admitting Physician Chuck Hinthristopher S Dickson, MD  Outpatient Primary MD for the patient is Ginette OttoSTONEKING,[name] THOMAS, MD  LOS - 6   No chief complaint on file.      Brief HPI   59 year old with past medical history of ESRD started on dialysis about 3 weeks ago through tunneled catheter, presented to the hospital for creation of AV fistula, patient developed cardiac arrest and admitted to the ICU. Transferred on 10/31st for the [facility] were further evaluation. Patient has been receiving daily hemodialysis per nephrology for massive fluid overload. Physical therapy recommended skilled nursing facility.   Assessment & Plan   Principal problem Cardiopulmonary arrest Patient presented to the hospital after she had cardiopulmonary arrest (6 minutes) during AV fistula creation in the OR on 07/08/15.. The patient was admitted by critical care service to ICU. Patient was intubated, was extubated on 10/28. Per cardiology,Dr Croitoru, this appears to be respiratory arrest followed by PEA. 2-D echocardiogram showed LVEF of 55-60% with grade 2 diastolic dysfunction.  Active problems Paroxysmal atrial fibrillation with prior history of stroke - Rate controlled, continue metoprolol - CHA2DS2-VASc is 6, Cardiology recommended anticoagulation,  - Started on oral Coumadin, currently on IV heparin as well. INR pending today  ESRD on hemodialysis Patient recently started on dialysis, getting dialysis from tunneled catheter. Unsuccessful attempt of placement of permanent HD access secondary to the cardiopulmonary arrest. -Nephrology following, Patient has massive fluid overload per nephrology, continue hemodialysis for volume - Not a candidate for permanent access per vascular  surgery  Severe pulmonary arterial hypertension:  likely secondary to obesity, OSA, COPD and chronic congestion from ESRD (started on dialysis in the past 3 weeks). Cardiac catheterization in 2015 showed pulmonary venous hypertension. Pulmonary artery pressure is about 78 mmHg in the last 2-D echo.  Acute on chronic diastolic CHF - 2-D echo on 10/28 showed EF of 55-60% with grade 2 diastolic dysfunction. - Volume management with hemodialysis  Generalized debility  - Patient seen by PT recommended CIR, consulted and they recommended either home or SNF. - Patient will likely need skilled nursing facility, very deconditioned, social worker consult placed  ADDENDUM: Vaginal bleeding Called by the RN that patient was noticed to have vaginal bleeding today. Heparin drip discontinued, hold Coumadin, pelvic ultrasound ordered to rule out any fibroids or abnormal pathology. Discussed with pharmacist as well.   Code Status: Full CODE STATUS  Family Communication: Discussed in detail with the patient, all imaging results, lab results explained to the patient    Disposition Plan: Patient seen by PT recommended CIR, consulted and they recommended either home or SNF. Patient will likely need skilled nursing facility, very deconditioned, social worker consult placed  Time Spent in minutes 25 minutes  Procedures  Intubation, extubation Hemodialysis 2-D echo  Consults   Patient was admitted by critical care service to ICU Nephrology Cardiology  Vascular surgery  DVT Prophylaxis  heparin IV, Coumadin   Medications  Scheduled Meds: . antiseptic oral rinse  7 mL Mouth Rinse QID  . aspirin EC  81 mg Oral Daily  . chlorhexidine gluconate  15 mL Mouth Rinse BID  . Chlorhexidine Gluconate Cloth  6 each Topical Q0600  . darbepoetin (ARANESP) injection - DIALYSIS  100 mcg Intravenous Q Mon-HD  . docusate sodium  100 mg Oral BID  . ferric gluconate (FERRLECIT/NULECIT) IV  125 mg Intravenous Q  M,W,F-HD  . ferumoxytol  510 mg Intravenous Weekly  . insulin aspart  0-15 Units Subcutaneous TID WC  . insulin glargine  40 Units Subcutaneous BID  . levothyroxine  75 mcg Oral QAC breakfast  . metoprolol tartrate  12.5 mg Oral BID  . multivitamin  1 tablet Oral QHS  . pantoprazole  40 mg Oral Daily  . warfarin   Does not apply Once  . Warfarin - Pharmacist Dosing Inpatient   Does not apply q1800   Continuous Infusions: . sodium chloride Stopped (07/12/15 1100)  . heparin 1,550 Units/hr (07/14/15 0301)   PRN Meds:.acetaminophen **OR** acetaminophen, albuterol, fentaNYL (SUBLIMAZE) injection, hydrALAZINE, metoprolol, phenol   Antibiotics   Anti-infectives    Start     Dose/Rate Route Frequency Ordered Stop   07/08/15 2200  cefUROXime (ZINACEF) 1.5 g in dextrose 5 % 50 mL IVPB  Status:  Discontinued     1.5 g 100 mL/hr over 30 Minutes Intravenous Every 12 hours 07/08/15 1612 07/08/15 1625   07/08/15 1100  cefUROXime (ZINACEF) 1.5 g in dextrose 5 % 50 mL IVPB     1.5 g 100 mL/hr over 30 Minutes Intravenous To [facility] 07/07/15 1135 07/08/15 1325   07/08/15 1015  dextrose 5 % with cefUROXime (ZINACEF) ADS Med    Comments:  Alferd Apa   : cabinet override      07/08/15 1015 07/08/15 2229        Subjective:   Kimberly [name] was seen and examined today.  Somewhat lethargic however denies any specific complaints this morning no fevers or chills, rate controlled. Denies any chest pain. Patient denies dizziness, abdominal pain, N/V/D/C, new weakness, numbess, tingling. No acute events overnight.    Objective:   Blood pressure 131/58, pulse 84, temperature 98.4 F (36.9 C), temperature source Oral, resp. rate 20, height 5\' 4"  (1.626 m), weight 130.1 kg (286 lb 13.1 oz), SpO2 98 %.  Wt Readings from Last 3 Encounters:  07/14/15 130.1 kg (286 lb 13.1 oz)  07/07/15 146.512 kg (323 lb)  06/24/15 146.512 kg (323 lb)     Intake/Output Summary (Last 24 hours) at  07/14/15 1011 Last data filed at 07/14/15 0134  Gross per 24 hour  Intake    600 ml  Output   3341 ml  Net  -2741 ml    Exam  General: Alert and oriented x 3, NAD  HEENT:  PERRLA, EOMI, Anicteric Sclera, mucous membranes moist.   Neck: Supple, no JVD, no masses  CVS: S1 S2 auscultated, no rubs, murmurs or gallops. Regular rate and rhythm.  Respiratory: Clear to auscultation bilaterally, no wheezing, rales or rhonchi  Abdomen:  Obese,Soft, nontender, nondistended, + bowel sounds  Ext: no cyanosis clubbing or edema  Neuro: AAOx3, Cr N's II- XII. Strength 5/5 upper and lower extremities bilaterally  Skin: No rashes  Psych: Normal affect and demeanor, alert and oriented x3    Data Review   Micro Results Recent Results (from the past 240  hour(s))  Culture, Urine     Status: None   Collection Time: 07/08/15  5:05 PM  Result Value Ref Range Status   Specimen Description URINE, RANDOM  Final   Special Requests ADDED [id number]  Final   Culture NO GROWTH 1 DAY  Final   Report Status 07/10/2015 FINAL  Final    Radiology Reports Dg Chest Port 1 View  07/10/2015  CLINICAL DATA:  Acute respiratory failure. EXAM: PORTABLE CHEST 1 VIEW COMPARISON:  07/08/2015, 06/24/2015 and 04/06/2015 FINDINGS: NG tube and ET tube have been removed. Central catheter is are in good position, unchanged. Pulmonary vascular congestion has resolved. Lungs are now clear. No effusions. IMPRESSION: Resolution of pulmonary vascular congestion. Electronically Signed   By: Francene Boyers M.D.   On: 07/10/2015 07:23   Dg Chest Port 1 View  07/08/2015  CLINICAL DATA:  Intubated.  Central line placement. EXAM: PORTABLE CHEST 1 VIEW COMPARISON:  Earlier today. FINDINGS: Endotracheal tube in satisfactory position. Right jugular catheter tip in the superior vena cava. No pneumothorax. The left jugular double-lumen catheter is unchanged with the catheter tips in the region of the confluence of the innominate  veins. Nasogastric tube extending into the stomach. Stable enlarged cardiac silhouette. Mild decrease in prominence of the pulmonary vasculature. Stable mildly prominent interstitial markings. No pleural fluid. Unremarkable bones. IMPRESSION: 1. Endotracheal tube in satisfactory position. 2. The left jugular double-lumen catheter remains proximally positioned with the catheter tips in the region of the confluence of the innominate veins. 3. Mildly improved pulmonary vascular congestion. 4. Stable cardiomegaly and mild chronic interstitial lung disease. Electronically Signed   By: Beckie Salts M.D.   On: 07/08/2015 17:17   Dg Chest Port 1 View  07/08/2015  CLINICAL DATA:  Post cardiac arrest.  Intubation. EXAM: PORTABLE CHEST 1 VIEW COMPARISON:  06/24/2015 FINDINGS: Endotracheal tube is in place with tip in the lower trachea estimated to be approximately 4.7 cm above the carina. Patient has a left internal jugular central venous line, tip overlying the level of the superior vena cava at its confluence with the brachiocephalic vein. Heart is enlarged. There is mild pulmonary vascular congestion. No overt edema identified. No acute fractures or obvious mass pneumothorax. IMPRESSION: 1. Interval intubation. 2. Mild pulmonary vascular congestion. Electronically Signed   By: [name] M.D.   On: 07/08/2015 16:02   Dg Chest Port 1 View  06/24/2015  CLINICAL DATA:  Evaluate for pneumothorax appy catheter placement 04/06/2015 EXAM: PORTABLE CHEST 1 VIEW COMPARISON:  04/06/2015 FINDINGS: Dialysis catheter from the left with tips at the upper SVC. No pneumothorax or new mediastinal widening. Chronic cardiomegaly. Mediastinal contours are distorted by rightward rotation. No effusion or edema. IMPRESSION: No pneumothorax after dialysis catheter placement. Electronically Signed   By: Marnee Spring M.D.   On: 06/24/2015 12:12   Dg Abd Portable 1v  07/08/2015  CLINICAL DATA:  Enteric tube placement. EXAM:  PORTABLE ABDOMEN - 1 VIEW COMPARISON:  06/22/2014 abdominal radiograph FINDINGS: Enteric tube terminates in distal stomach. No dilated small bowel loops. Mild stool throughout the colon. No evidence of pneumatosis or pneumoperitoneum. No pathologic soft tissue calcifications. IMPRESSION: Enteric tube terminates in the distal stomach. Electronically Signed   By: Delbert Phenix M.D.   On: 07/08/2015 17:18   Dg Fluoro Guide Cv Line-no Report  06/24/2015  CLINICAL DATA:  FLOURO GUIDE CV LINE Fluoroscopy was utilized by the requesting physician.  No radiographic interpretation.   Ct Portable Head W/o Cm  07/08/2015  CLINICAL DATA:  59 year old female status post cardiac arrest. Initial encounter. EXAM: PORTABLE CT HEAD WITHOUT CONTRAST TECHNIQUE: Portable CT axial images were obtained in the ICU. COMPARISON:  Head CT without contrast 06/25/2014. FINDINGS: Portable examination due to the patient's un stable condition in the ICU. However, due to body habitus the caudal aspect of the brain, below the cerebral peduncles, could not be imaged. Visible cerebral volume is stable since 2015. 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 No ventriculomegaly. No midline shift. Visible basilar cisterns are patent. No intracranial mass effect is evident. Partially visible hypodensity in the left occipital pole, appears stable to the prior study. Visualized paranasal sinuses and mastoids are clear. No acute osseous abnormality identified. No acute orbit or scalp soft tissue findings. Calcified supraclinoid ICA atherosclerosis. IMPRESSION: 1. Portable CT head exam in the ICU, during which the caudal half of the brain could not be imaged. 2. Visualized brain parenchyma is stable since 2015, with chronic ischemic changes in the left hemisphere. Electronically Signed   By: Odessa Fleming M.D.   On: 07/08/2015 20:41    CBC  Recent Labs Lab 07/12/15 0505 07/12/15 1409 07/13/15 0510 07/13/15 2224 07/14/15 0445    WBC 9.8 9.6 12.4* 10.9* 12.5*  HGB 9.7* 9.9* 10.0* 9.6* 10.6*  HCT 31.8* 31.5* 31.0* 30.7* 33.1*  PLT 159 172 186 158 143*  MCV 102.3* 102.6* 102.0* 103.4* 100.6*  MCH 31.2 32.2 32.9 32.3 32.2  MCHC 30.5 31.4 32.3 31.3 32.0  RDW 20.0* 19.9* 20.5* 20.6* 20.4*  LYMPHSABS  --  1.3  --   --   --   MONOABS  --  0.6  --   --   --   EOSABS  --  0.3  --   --   --   BASOSABS  --  0.0  --   --   --     Chemistries   Recent Labs Lab 07/08/15 1631 07/09/15 0445 07/10/15 0400 07/11/15 0453 07/12/15 0505 07/12/15 1410 07/13/15 0510 07/13/15 2224  NA 133* 130* 134* 135 134* 135 131* 131*  K 3.1* 3.9 3.5 3.5 3.9 3.9 4.0 4.1  CL 100* 95* 97* 98* 100* 99* 97* 94*  CO2 18* GLUCOSE 255* 300* 242* 156* 144* 125* 140* 171*  BUN 26* 31* 20 32* 38* 41* 25* 30*  CREATININE 3.82* 4.00* 2.81* 3.52* 3.60* 3.73* 2.86* 3.14*  CALCIUM 8.3* 8.5* 8.5* 9.4 9.3 9.4 8.7* 9.2  MG  --   --  1.9  --   --   --   --   --   AST 87* 53*  --   --   --   --   --   --   ALT 41 35  --   --   --   --   --   --   ALKPHOS 136* 119  --   --   --   --   --   --   BILITOT 2.2* 1.3*  --   --   --   --   --   --    ------------------------------------------------------------------------------------------------------------------ estimated creatinine clearance is 25.5 mL/min (by C-G formula based on Cr of 3.14). ------------------------------------------------------------------------------------------------------------------ No results for input(s): HGBA1C in the last 72 hours. ------------------------------------------------------------------------------------------------------------------ No results for input(s): CHOL, HDL, LDLCALC, TRIG, CHOLHDL, LDLDIRECT in the last 72 hours. ------------------------------------------------------------------------------------------------------------------ No results for input(s): TSH, T4TOTAL, T3FREE, THYROIDAB in the last 72 hours.  Invalid  input(s):  FREET3 ------------------------------------------------------------------------------------------------------------------ No results for input(s): VITAMINB12, FOLATE, FERRITIN, TIBC, IRON, RETICCTPCT in the last 72 hours.  Coagulation profile  Recent Labs Lab 07/08/15 1631 07/11/15 1601 07/11/15 2000 07/13/15 0510  INR 1.59* 1.42 1.38 1.35    No results for input(s): DDIMER in the last 72 hours.  Cardiac Enzymes  Recent Labs Lab 07/08/15 1502  07/10/15 2212 07/10/15 2327 07/11/15 0453  CKMB 2.2  --   --   --   --   TROPONINI 0.07*  < > 0.96* 1.05* 1.10*  < > = values in this interval not displayed. ------------------------------------------------------------------------------------------------------------------ Invalid input(s): POCBNP   Recent Labs  07/13/15 0621 07/13/15 1132 07/13/15 1712 07/13/15 2059 07/14/15 0353 07/14/15 0635  GLUCAP 129* 172* 162* 170* 100* 108*     Nohelani Benning M.D. [facility] 07/14/2015, 10:11 AM  Pager: [phone] Between 7am to 7pm - call Pager - [phone]  After 7pm go to [email or web address] - password [email or web address]  Call night coverage person covering after 7pm

## 2015-07-14 NOTE — Progress Notes (Signed)
Patient Name: Kimberly ManilaSusan K [name] Date of Encounter: 07/14/2015  Active Problems:   CAD (coronary artery disease), native coronary artery   Chronic diastolic CHF (congestive heart failure) (HCC)   Mitral valve stenosis, moderate   Pulmonary HTN (HCC)   Morbid obesity (HCC)   ESRD (end stage renal disease) (HCC)   Paroxysmal atrial fibrillation (HCC)   Altered mental status   Pulmonary arterial hypertension (HCC)   Acute on chronic diastolic congestive heart failure (HCC)   Mitral stenosis   Length of Stay: 6  SUBJECTIVE  Worsened vaginal bleeding. All anticoagulation stopped. Remains in sinus rhythm, no cardiac symptoms. Edema is starting to improve - calves show some wrinkles. Weight down another 5 lb since yesterday.  CURRENT MEDS . antiseptic oral rinse  7 mL Mouth Rinse QID  . aspirin EC  81 mg Oral Daily  . chlorhexidine gluconate  15 mL Mouth Rinse BID  . Chlorhexidine Gluconate Cloth  6 each Topical Q0600  . darbepoetin (ARANESP) injection - DIALYSIS  100 mcg Intravenous Q Mon-HD  . docusate sodium  100 mg Oral BID  . ferric gluconate (FERRLECIT/NULECIT) IV  125 mg Intravenous Q M,W,F-HD  . insulin aspart  0-15 Units Subcutaneous TID WC  . insulin glargine  40 Units Subcutaneous BID  . levothyroxine  75 mcg Oral QAC breakfast  . metoprolol tartrate  12.5 mg Oral BID  . multivitamin  1 tablet Oral QHS  . pantoprazole  40 mg Oral Daily  . warfarin   Does not apply Once  . Warfarin - Pharmacist Dosing Inpatient   Does not apply q1800    OBJECTIVE   Intake/Output Summary (Last 24 hours) at 07/14/15 1457 Last data filed at 07/14/15 1400  Gross per 24 hour  Intake    600 ml  Output   3542 ml  Net  -2942 ml   Filed Weights   07/13/15 2145 07/14/15 0134 07/14/15 0300  Weight: 295 lb 10.2 oz (134.1 kg) 291 lb 7.2 oz (132.2 kg) 286 lb 13.1 oz (130.1 kg)    PHYSICAL EXAM Filed Vitals:   07/14/15 0134 07/14/15 0145 07/14/15 0300 07/14/15 0427  BP: 132/72 134/68   131/58  Pulse: 83 82  84  Temp: 98.2 F (36.8 C)   98.4 F (36.9 C)  TempSrc: Oral   Oral  Resp: 24 22  20   Height:      Weight: 291 lb 7.2 oz (132.2 kg)  286 lb 13.1 oz (130.1 kg)   SpO2:    98%   General: Alert, oriented x3, no distress, morbid obesity precludes many parts of the exam. Seen during HD. Head: no evidence of trauma, PERRL, EOMI, no exophtalmos or lid lag, no myxedema, no xanthelasma; normal ears, nose and oropharynx Neck: unable to see jugular venous pulsations or hepatojugular reflux; brisk carotid pulses without delay and no carotid bruits Chest: clear to auscultation, no signs of consolidation by percussion or palpation, normal fremitus, symmetrical and full respiratory excursions Cardiovascular: unable to find the apical impulse, regular rhythm, normal first and second heart sounds, no rubs or gallops, no murmur Abdomen: no tenderness or distention, no masses by palpation, no abnormal pulsatility or arterial bruits, normal bowel sounds, no hepatosplenomegaly Extremities: no clubbing, cyanosis; massive bilateral calf edema, but starting to improve, some wrinkles; 2+ radial, ulnar and brachial pulses bilaterally; unable to palpate pulses in lower extremities. Neurological: grossly nonfocal  LABS  CBC  Recent Labs  07/12/15 1409  07/13/15 2224 07/14/15 0445  WBC 9.6  < >  10.9* 12.5*  NEUTROABS 7.3  --   --   --   HGB 9.9*  < > 9.6* 10.6*  HCT 31.5*  < > 30.7* 33.1*  MCV 102.6*  < > 103.4* 100.6*  PLT 172  < > 158 143*  < > = values in this interval not displayed. Basic Metabolic Panel  Recent Labs  07/12/15 1410 07/13/15 0510 07/13/15 2224  NA 135 131* 131*  K 3.9 4.0 4.1  CL 99* 97* 94*  CO2 GLUCOSE 125* 140* 171*  BUN 41* 25* 30*  CREATININE 3.73* 2.86* 3.14*  CALCIUM 9.4 8.7* 9.2  PHOS 5.3*  --  3.9   Liver Function Tests  Recent Labs  07/12/15 1410 07/13/15 2224  ALBUMIN 2.9* 3.0*   Radiology Studies Imaging results have  been reviewed and US Transvaginal Non-ob  07/14/2015  CLINICAL DATA:  Vaginal bleeding. EXAM: TRANSABDOMINAL AND TRANSVAGINAL ULTRASOUND OF PELVIS TECHNIQUE: Both transabdominal and transvaginal ultrasound examinations of the pelvis were performed. Transabdominal technique was performed for global imaging of the pelvis including uterus, ovaries, adnexal regions, and pelvic cul-de-sac. It was necessary to proceed with endovaginal exam following the transabdominal exam to visualize the cervix and endometrium to better advantage. COMPARISON:  None FINDINGS: Uterus Measurements: 5.5 x 3.8 x 4.1 cm. No fibroids or other mass visualized. Endometrium Thickness: 13.2 mm.  No focal abnormality visualized. Right ovary Not visualized.  No adnexal mass. Left ovary Not visualized.  No adnexal mass. Other findings Trace pelvic free fluid. IMPRESSION: 1. Exam limited due to body habitus. 2. Uterus normal in size.  No uterine mass. 3. Endometrium measures 13.2 mm in thickness. No endometrial mass seen. In the setting of post-menopausal bleeding, endometrial sampling is indicated to exclude carcinoma. If results are benign, sonohysterogram should be considered for focal lesion work-up. (Ref: Radiological Reasoning: Algorithmic Workup of Abnormal Vaginal Bleeding with Endovaginal Sonography and Sonohysterography. AJR 2008; 161:W96-04) 4. Neither ovary visualized.  No adnexal masses. Electronically Signed   By: Amie Portland M.D.   On: 07/14/2015 12:50   US Pelvis Complete  07/14/2015  CLINICAL DATA:  Vaginal bleeding. EXAM: TRANSABDOMINAL AND TRANSVAGINAL ULTRASOUND OF PELVIS TECHNIQUE: Both transabdominal and transvaginal ultrasound examinations of the pelvis were performed. Transabdominal technique was performed for global imaging of the pelvis including uterus, ovaries, adnexal regions, and pelvic cul-de-sac. It was necessary to proceed with endovaginal exam following the transabdominal exam to visualize the cervix and  endometrium to better advantage. COMPARISON:  None FINDINGS: Uterus Measurements: 5.5 x 3.8 x 4.1 cm. No fibroids or other mass visualized. Endometrium Thickness: 13.2 mm.  No focal abnormality visualized. Right ovary Not visualized.  No adnexal mass. Left ovary Not visualized.  No adnexal mass. Other findings Trace pelvic free fluid. IMPRESSION: 1. Exam limited due to body habitus. 2. Uterus normal in size.  No uterine mass. 3. Endometrium measures 13.2 mm in thickness. No endometrial mass seen. In the setting of post-menopausal bleeding, endometrial sampling is indicated to exclude carcinoma. If results are benign, sonohysterogram should be considered for focal lesion work-up. (Ref: Radiological Reasoning: Algorithmic Workup of Abnormal Vaginal Bleeding with Endovaginal Sonography and Sonohysterography. AJR 2008; 540:J81-19) 4. Neither ovary visualized.  No adnexal masses. Electronically Signed   By: Amie Portland M.D.   On: 07/14/2015 12:50    TELE NSR    ASSESSMENT AND PLAN 1. Cardiopulmonary arrest - respiratory arrest clearly preceded cardiac arrest (PEA). She received very little sedation before her local anesthetic was administered (  1 mg Versed + Propofol 25 mcg/kg/min for a few minutes), but has had problems with exaggerated response to sedation before.  2. Paroxysmal atrial fibrillation - she has frequently had sustained irregular palpitations at home before the current diagnostic arrhythmia. High risk for embolic stroke. Has had a previous stroke. All major stroke risk factors are present except age, CHADS Vasc 6. Anticoagulation on hold due to vaginal bleeding, should be restarted once that is diagnosed and treated.  3. Diastolic HF, acute on chronic - now on HD. One year ago, "dry weight" was estimated at 250 lb or so. Still about 35 lb volume excess using that estimation. No respiratory difficulty at rest.  4. Severe pulmonary artery HTN - at cath 2015 was mostly due to pulmonary venous  HTN (diastolic LV dysfunction +/- mild mitral stenosis), but there was also a component of intrinsic pulmonary arteriolar disease (obesity, OSA, COPD).  5. ESRD. Still grossly hypervolemic, HD may be limited by BP, especially low DBP. Can give beta blocker after HD or use 1/2 dose predialysis if this is a problem.   Thurmon Fair, MD, [facility] [phone] office [phone] pager 07/14/2015 2:57 PM

## 2015-07-14 NOTE — Progress Notes (Addendum)
ANTICOAGULATION CONSULT NOTE - Follow Up Consult  Pharmacy Consult for Heparin and Warfarin Indication: atrial fibrillation  Allergies  Allergen Reactions  . Ciprofloxacin Itching  . Epinephrine     Increased heart rate    Patient Measurements: Height: 5\' 4"  (162.6 cm) Weight: 286 lb 13.1 oz (130.1 kg) IBW/kg (Calculated) : 54.7  Vital Signs: Temp: 98.4 F (36.9 C) (11/02 0427) Temp Source: Oral (11/02 0427) BP: 131/58 mmHg (11/02 0427) Pulse Rate: 84 (11/02 0427)  Labs:  Recent Labs  07/11/15 1601 07/11/15 2000  07/12/15 1410 07/12/15 1928 07/13/15 0510 07/13/15 2224 07/14/15 0445  HGB  --   --   < >  --   --  10.0* 9.6* 10.6*  HCT  --   --   < >  --   --  31.0* 30.7* 33.1*  PLT  --   --   < >  --   --  186 158 143*  LABPROT 17.4* 17.1*  --   --   --  16.8*  --   --   INR 1.42 1.38  --   --   --  1.35  --   --   HEPARINUNFRC  --   --   < >  --  0.67 0.40  --  0.44  CREATININE  --   --   < > 3.73*  --  2.86* 3.14*  --   < > = values in this interval not displayed.  Estimated Creatinine Clearance: 25.5 mL/min (by C-G formula based on Cr of 3.14).   Assessment: Patient has afib. No anticoagulation PTA - CHADSVASC at least 6.  Heparin level therapeutic this AM Continues on Coumadin  Goal of Therapy:  Heparin level 0.3-0.7 units/ml Monitor platelets by anticoagulation protocol: Yes  INR = 2 to 3   Plan:  Continue heparin 1550 units/hr Repeat Coumadin 6 mg po x 1 dose at 1800 pm Follow up AM labs  Thank you Okey RegalLisa Kalijah Zeiss, PharmD [phone] 07/14/2015 10:15 AM    Reports of continued vaginal bleeding --> holding Coumadin and heparin  Thank you Okey RegalLisa Kymberly Blomberg, PharmD

## 2015-07-14 NOTE — Progress Notes (Signed)
Dr Isidoro Donningai called concerning vaginal bleeding,. Patient on heparin drip and oral anticoagulant . Orders received to stop heparin drip and hold anticoagulant . Will continue to monitor patient.

## 2015-07-14 NOTE — Progress Notes (Signed)
[facility] Progress Note   Subjective: 3.3 kg off with HD yest, no new complaints  Filed Vitals:   07/14/15 0134 07/14/15 0145 07/14/15 0300 07/14/15 0427  BP: 132/72 134/68  131/58  Pulse: 83 82  84  Temp: 98.2 F (36.8 C)   98.4 F (36.9 C)  TempSrc: Oral   Oral  Resp: 24 22  20   Height:      Weight: 409.8132.2 kg (291 lb 7.2 oz)  130.1 kg (286 lb 13.1 oz)   SpO2:    98%   Exam: Alert, no distress, morbidly obese No jvd Chest dec'd R base, L clear RRR no MRG Abd obese, ntnd +bs Ext 2-3+ edema LE's and lower abd wall Neuro is alert, Ox 3   MWF [facility]   131kg  2/2 Bath  Heparin 6000   IJ cath Mircera 75 q 2 weeks last10/19- last hgb 10.4- also venofer 100 q tx.through 11/4   No PTH- calc 9.8, phos 4.5       Assessment: 1 Massive vol overload 2 PEA arrest 10/27 3 ESRD recent start mwf ,using cath 4 Afib - new, back in NSR.  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 8 Vasc access- prob not candidate for perm access per VVS 9 COPD/ OHS home O2 10 DM2  Plan - continue daily HD for volume   Vinson Moselleob Aino Heckert MD [facility] pager [phone]    cell [phone] 07/14/2015, 10:53 AM    Recent Labs Lab 07/12/15 0505 07/12/15 1410 07/13/15 0510 07/13/15 2224  NA 134* 135 131* 131*  K 3.9 3.9 4.0 4.1  CL 100* 99* 97* 94*  CO2 25 25 25 26   GLUCOSE 144* 125* 140* 171*  BUN 38* 41* 25* 30*  CREATININE 3.60* 3.73* 2.86* 3.14*  CALCIUM 9.3 9.4 8.7* 9.2  PHOS 5.1* 5.3*  --  3.9    Recent Labs Lab 07/08/15 1631 07/09/15 0445 07/12/15 0505 07/12/15 1410 07/13/15 2224  AST 87* 53*  --   --   --   ALT 41 35  --   --   --   ALKPHOS 136* 119  --   --   --   BILITOT 2.2* 1.3*  --   --   --   PROT 6.6 6.6  --   --   --   ALBUMIN 2.8* 2.6* 2.8* 2.9* 3.0*    Recent Labs Lab 07/12/15 1409 07/13/15 0510 07/13/15 2224 07/14/15 0445  WBC 9.6 12.4* 10.9* 12.5*  NEUTROABS 7.3  --   --   --   HGB 9.9* 10.0* 9.6*  10.6*  HCT 31.5* 31.0* 30.7* 33.1*  MCV 102.6* 102.0* 103.4* 100.6*  PLT 172 186 158 143*   . antiseptic oral rinse  7 mL Mouth Rinse QID  . aspirin EC  81 mg Oral Daily  . chlorhexidine gluconate  15 mL Mouth Rinse BID  . Chlorhexidine Gluconate Cloth  6 each Topical Q0600  . darbepoetin (ARANESP) injection - DIALYSIS  100 mcg Intravenous Q Mon-HD  . docusate sodium  100 mg Oral BID  . ferric gluconate (FERRLECIT/NULECIT) IV  125 mg Intravenous Q M,W,F-HD  . insulin aspart  0-15 Units Subcutaneous TID WC  . insulin glargine  40 Units Subcutaneous BID  . levothyroxine  75 mcg Oral QAC breakfast  . metoprolol tartrate  12.5 mg Oral BID  . multivitamin  1 tablet Oral QHS  . pantoprazole  40 mg  Oral Daily  . warfarin   Does not apply Once  . Warfarin - Pharmacist Dosing Inpatient   Does not apply q1800   . sodium chloride Stopped (07/12/15 1100)   acetaminophen **OR** acetaminophen, albuterol, fentaNYL (SUBLIMAZE) injection, hydrALAZINE, metoprolol, phenol

## 2015-07-15 DIAGNOSIS — N939 Abnormal uterine and vaginal bleeding, unspecified: Secondary | ICD-10-CM | POA: Diagnosis present

## 2015-07-15 LAB — GLUCOSE, CAPILLARY
GLUCOSE-CAPILLARY: 201 mg/dL — AB (ref 65–99)
Glucose-Capillary: 195 mg/dL — ABNORMAL HIGH (ref 65–99)
Glucose-Capillary: 133 mg/dL — ABNORMAL HIGH (ref 65–99)

## 2015-07-15 LAB — CBC
HCT: 32.3 % — ABNORMAL LOW (ref 36.0–46.0)
Hemoglobin: 9.8 g/dL — ABNORMAL LOW (ref 12.0–15.0)
MCH: 31.4 pg (ref 26.0–34.0)
MCHC: 30.3 g/dL (ref 30.0–36.0)
MCV: 103.5 fL — AB (ref 78.0–100.0)
PLATELETS: 133 10*3/uL — AB (ref 150–400)
RBC: 3.12 MIL/uL — ABNORMAL LOW (ref 3.87–5.11)
RDW: 20.6 % — AB (ref 11.5–15.5)
WBC: 10 10*3/uL (ref 4.0–10.5)

## 2015-07-15 LAB — PROTIME-INR
INR: 1.74 — ABNORMAL HIGH (ref 0.00–1.49)
Prothrombin Time: 20.3 seconds — ABNORMAL HIGH (ref 11.6–15.2)

## 2015-07-15 LAB — BASIC METABOLIC PANEL
ANION GAP: 9 (ref 5–15)
BUN: 16 mg/dL (ref 6–20)
CALCIUM: 9.2 mg/dL (ref 8.9–10.3)
CO2: 26 mmol/L (ref 22–32)
Chloride: 100 mmol/L — ABNORMAL LOW (ref 101–111)
Creatinine, Ser: 2.56 mg/dL — ABNORMAL HIGH (ref 0.44–1.00)
GFR calc Af Amer: 22 mL/min — ABNORMAL LOW (ref >60)
GFR, EST NON AFRICAN AMERICAN: 19 mL/min — AB (ref >60)
GLUCOSE: 195 mg/dL — AB (ref 65–99)
Potassium: 4.2 mmol/L (ref 3.5–5.1)
SODIUM: 135 mmol/L (ref 135–145)

## 2015-07-15 LAB — HEPARIN LEVEL (UNFRACTIONATED): Heparin Unfractionated: <0.1 IU/mL — ABNORMAL LOW (ref 0.30–0.70)

## 2015-07-15 NOTE — Progress Notes (Signed)
Patient Name: Kimberly [name] Date of Encounter: 07/15/2015  Active Problems:   CAD (coronary artery disease), native coronary artery   Chronic diastolic CHF (congestive heart failure) (HCC)   Mitral valve stenosis, moderate   Pulmonary HTN (HCC)   Morbid obesity (HCC)   ESRD (end stage renal disease) (HCC)   Paroxysmal atrial fibrillation (HCC)   Altered mental status   Pulmonary arterial hypertension (HCC)   Acute on chronic diastolic congestive heart failure (HCC)   Mitral stenosis  SUBJECTIVE  Had coughing spell last night, followed by sensation of "someone punching in her chest" that was resolved by itself in few seconds. Stil having mild vaginal bleeding. No palpations. Stable sob.   CURRENT MEDS . antiseptic oral rinse  7 mL Mouth Rinse QID  . aspirin EC  81 mg Oral Daily  . chlorhexidine gluconate  15 mL Mouth Rinse BID  . darbepoetin (ARANESP) injection - DIALYSIS  100 mcg Intravenous Q Mon-HD  . docusate sodium  100 mg Oral BID  . ferric gluconate (FERRLECIT/NULECIT) IV  125 mg Intravenous Q M,W,F-HD  . insulin aspart  0-15 Units Subcutaneous TID WC  . insulin glargine  40 Units Subcutaneous BID  . levothyroxine  75 mcg Oral QAC breakfast  . metoprolol tartrate  12.5 mg Oral BID  . multivitamin  1 tablet Oral QHS  . pantoprazole  40 mg Oral Daily  . warfarin   Does not apply Once  . Warfarin - Pharmacist Dosing Inpatient   Does not apply q1800    OBJECTIVE  Filed Vitals:   07/14/15 1900 07/14/15 2135 07/14/15 2241 07/15/15 0558  BP: 113/65 105/66 119/53 124/52  Pulse: 86 79 70 82  Temp: 98.6 F (37 C) 98.4 F (36.9 C)  98.2 F (36.8 C)  TempSrc:  Oral  Oral  Resp: Height:      Weight:    282 lb 4.8 oz (128.05 kg)  SpO2: 99% 99% 100% 97%    Intake/Output Summary (Last 24 hours) at 07/15/15 1137 Last data filed at 07/15/15 0921  Gross per 24 hour  Intake    840 ml  Output   3501 ml  Net  -2661 ml   Filed Weights   07/14/15 0300  07/14/15 1545 07/15/15 0558  Weight: 286 lb 13.1 oz (130.1 kg) 288 lb 12.8 oz (131 kg) 282 lb 4.8 oz (128.05 kg)    PHYSICAL EXAM  General: Pleasant, NAD. Neuro: Alert and oriented X 3. Moves all extremities spontaneously. Psych: Normal affect. HEENT:  Normal  Neck: Supple without bruits or JVD. Lungs:  Resp regular and unlabored, CTA. Heart: RRR no s3, s4, or murmurs. Abdomen: Soft, non-tender, non-distended, BS + x 4.  Extremities: No clubbing, cyanosis. Bilateral calf edema. Radials 2+ and equal bilaterally. unable to palpate pulses in lower extremities.  Accessory Clinical Findings  CBC  Recent Labs  07/12/15 1409  07/14/15 0445 07/15/15 0220  WBC 9.6  < > 12.5* 10.0  NEUTROABS 7.3  --   --   --   HGB 9.9*  < > 10.6* 9.8*  HCT 31.5*  < > 33.1* 32.3*  MCV 102.6*  < > 100.6* 103.5*  PLT 172  < > 143* 133*  < > = values in this interval not displayed. Basic Metabolic Panel  Recent Labs  07/12/15 1410  07/13/15 2224 07/15/15 0220  NA 135  < > 131* 135  K 3.9  < > 4.1 4.2  CL 99*  < >  94* 100*  CO2 25  < > 26 26  GLUCOSE 125*  < > 171* 195*  BUN 41*  < > 30* 16  CREATININE 3.73*  < > 3.14* 2.56*  CALCIUM 9.4  < > 9.2 9.2  PHOS 5.3*  --  3.9  --   < > = values in this interval not displayed. Liver Function Tests  Recent Labs  07/12/15 1410 07/13/15 2224  ALBUMIN 2.9* 3.0*    TELE  Sinus rhythm  Radiology/Studies  [place] Transvaginal Non-ob  07/14/2015  CLINICAL DATA:  Vaginal bleeding. EXAM: TRANSABDOMINAL AND TRANSVAGINAL ULTRASOUND OF PELVIS TECHNIQUE: Both transabdominal and transvaginal ultrasound examinations of the pelvis were performed. Transabdominal technique was performed for global imaging of the pelvis including uterus, ovaries, adnexal regions, and pelvic cul-de-sac. It was necessary to proceed with endovaginal exam following the transabdominal exam to visualize the cervix and endometrium to better advantage. COMPARISON:  None FINDINGS: Uterus  Measurements: 5.5 x 3.8 x 4.1 cm. No fibroids or other mass visualized. Endometrium Thickness: 13.2 mm.  No focal abnormality visualized. Right ovary Not visualized.  No adnexal mass. Left ovary Not visualized.  No adnexal mass. Other findings Trace pelvic free fluid. IMPRESSION: 1. Exam limited due to body habitus. 2. Uterus normal in size.  No uterine mass. 3. Endometrium measures 13.2 mm in thickness. No endometrial mass seen. In the setting of post-menopausal bleeding, endometrial sampling is indicated to exclude carcinoma. If results are benign, sonohysterogram should be considered for focal lesion work-up. (Ref: Radiological Reasoning: Algorithmic Workup of Abnormal Vaginal Bleeding with Endovaginal Sonography and Sonohysterography. AJR 2008; 161:[id number]) 4. Neither ovary visualized.  No adnexal masses. Electronically Signed   By: Amie Portlandavid  Ormond M.D.   On: 07/14/2015 12:50   [place] Pelvis Complete  07/14/2015  CLINICAL DATA:  Vaginal bleeding. EXAM: TRANSABDOMINAL AND TRANSVAGINAL ULTRASOUND OF PELVIS TECHNIQUE: Both transabdominal and transvaginal ultrasound examinations of the pelvis were performed. Transabdominal technique was performed for global imaging of the pelvis including uterus, ovaries, adnexal regions, and pelvic cul-de-sac. It was necessary to proceed with endovaginal exam following the transabdominal exam to visualize the cervix and endometrium to better advantage. COMPARISON:  None FINDINGS: Uterus Measurements: 5.5 x 3.8 x 4.1 cm. No fibroids or other mass visualized. Endometrium Thickness: 13.2 mm.  No focal abnormality visualized. Right ovary Not visualized.  No adnexal mass. Left ovary Not visualized.  No adnexal mass. Other findings Trace pelvic free fluid. IMPRESSION: 1. Exam limited due to body habitus. 2. Uterus normal in size.  No uterine mass. 3. Endometrium measures 13.2 mm in thickness. No endometrial mass seen. In the setting of post-menopausal bleeding, endometrial sampling is  indicated to exclude carcinoma. If results are benign, sonohysterogram should be considered for focal lesion work-up. (Ref: Radiological Reasoning: Algorithmic Workup of Abnormal Vaginal Bleeding with Endovaginal Sonography and Sonohysterography. AJR 2008; 540:[id number]) 4. Neither ovary visualized.  No adnexal masses. Electronically Signed   By: Amie Portlandavid  Ormond M.D.   On: 07/14/2015 12:50   Dg Chest Port 1 View  07/10/2015  CLINICAL DATA:  Acute respiratory failure. EXAM: PORTABLE CHEST 1 VIEW COMPARISON:  07/08/2015, 06/24/2015 and 04/06/2015 FINDINGS: NG tube and ET tube have been removed. Central catheter is are in good position, unchanged. Pulmonary vascular congestion has resolved. Lungs are now clear. No effusions. IMPRESSION: Resolution of pulmonary vascular congestion. Electronically Signed   By: Francene BoyersJames  Maxwell M.D.   On: 07/10/2015 07:23   Dg Chest Port 1 View  07/08/2015  CLINICAL DATA:  Intubated.  Central line placement. EXAM: PORTABLE CHEST 1 VIEW COMPARISON:  Earlier today. FINDINGS: Endotracheal tube in satisfactory position. Right jugular catheter tip in the superior vena cava. No pneumothorax. The left jugular double-lumen catheter is unchanged with the catheter tips in the region of the confluence of the innominate veins. Nasogastric tube extending into the stomach. Stable enlarged cardiac silhouette. Mild decrease in prominence of the pulmonary vasculature. Stable mildly prominent interstitial markings. No pleural fluid. Unremarkable bones. IMPRESSION: 1. Endotracheal tube in satisfactory position. 2. The left jugular double-lumen catheter remains proximally positioned with the catheter tips in the region of the confluence of the innominate veins. 3. Mildly improved pulmonary vascular congestion. 4. Stable cardiomegaly and mild chronic interstitial lung disease. Electronically Signed   By: Beckie Salts M.D.   On: 07/08/2015 17:17   Dg Chest Port 1 View  07/08/2015  CLINICAL DATA:  Post  cardiac arrest.  Intubation. EXAM: PORTABLE CHEST 1 VIEW COMPARISON:  06/24/2015 FINDINGS: Endotracheal tube is in place with tip in the lower trachea estimated to be approximately 4.7 cm above the carina. Patient has a left internal jugular central venous line, tip overlying the level of the superior vena cava at its confluence with the brachiocephalic vein. Heart is enlarged. There is mild pulmonary vascular congestion. No overt edema identified. No acute fractures or obvious mass pneumothorax. IMPRESSION: 1. Interval intubation. 2. Mild pulmonary vascular congestion. Electronically Signed   By: [name] M.D.   On: 07/08/2015 16:02   Dg Chest Port 1 View  06/24/2015  CLINICAL DATA:  Evaluate for pneumothorax appy catheter placement 04/06/2015 EXAM: PORTABLE CHEST 1 VIEW COMPARISON:  04/06/2015 FINDINGS: Dialysis catheter from the left with tips at the upper SVC. No pneumothorax or new mediastinal widening. Chronic cardiomegaly. Mediastinal contours are distorted by rightward rotation. No effusion or edema. IMPRESSION: No pneumothorax after dialysis catheter placement. Electronically Signed   By: Marnee Spring M.D.   On: 06/24/2015 12:12   Dg Abd Portable 1v  07/08/2015  CLINICAL DATA:  Enteric tube placement. EXAM: PORTABLE ABDOMEN - 1 VIEW COMPARISON:  06/22/2014 abdominal radiograph FINDINGS: Enteric tube terminates in distal stomach. No dilated small bowel loops. Mild stool throughout the colon. No evidence of pneumatosis or pneumoperitoneum. No pathologic soft tissue calcifications. IMPRESSION: Enteric tube terminates in the distal stomach. Electronically Signed   By: Delbert Phenix M.D.   On: 07/08/2015 17:18   Dg Fluoro Guide Cv Line-no Report  06/24/2015  CLINICAL DATA:  FLOURO GUIDE CV LINE Fluoroscopy was utilized by the requesting physician.  No radiographic interpretation.   Ct Portable Head W/o Cm  07/08/2015  CLINICAL DATA:  59 year old female status post cardiac arrest.  Initial encounter. EXAM: PORTABLE CT HEAD WITHOUT CONTRAST TECHNIQUE: Portable CT axial images were obtained in the ICU. COMPARISON:  Head CT without contrast 06/25/2014. FINDINGS: Portable examination due to the patient's un stable condition in the ICU. However, due to body habitus the caudal aspect of the brain, below the cerebral peduncles, could not be imaged. Visible cerebral volume is stable since 2015. 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 No ventriculomegaly. No midline shift. Visible basilar cisterns are patent. No intracranial mass effect is evident. Partially visible hypodensity in the left occipital pole, appears stable to the prior study. Visualized paranasal sinuses and mastoids are clear. No acute osseous abnormality identified. No acute orbit or scalp soft tissue findings. Calcified supraclinoid ICA atherosclerosis. IMPRESSION: 1. Portable CT head exam in the ICU, during which the caudal half of  the brain could not be imaged. 2. Visualized brain parenchyma is stable since 2015, with chronic ischemic changes in the left hemisphere. Electronically Signed   By: Odessa Fleming M.D.   On: 07/08/2015 20:41    ASSESSMENT AND PLAN    1. Cardiopulmonary arrest - respiratory arrest clearly preceded cardiac arrest (PEA). She received very little sedation before her local anesthetic was administered (1 mg Versed + Propofol 25 mcg/kg/min for a few minutes), but has had problems with exaggerated response to sedation before.  2. Paroxysmal atrial fibrillation - she has frequently had sustained irregular palpitations at home before the current diagnostic arrhythmia. High risk for embolic stroke. Has had a previous stroke. All major stroke risk factors are present except age, CHADS Vasc 6. Anticoagulation on hold due to vaginal bleeding, should be restarted once that is diagnosed and treated. Maintaining sinus rhythm.   3. Diastolic HF, acute on chronic - now on daily HD. One year ago,  "dry weight" was estimated at 250 lb or so. Weight 282lb today. Down 6lb from yesterday.   4. Severe pulmonary artery HTN - at cath 2015 was mostly due to pulmonary venous HTN (diastolic LV dysfunction +/- mild mitral stenosis), but there was also a component of intrinsic pulmonary arteriolar disease (obesity, OSA, COPD).  5. ESRD. Still grossly hypervolemic, HD may be limited by BP, especially low DBP. Can give beta blocker after HD or use 1/2 dose predialysis if this is a problem.  6. Vaginal bleeding - Per primary  Signed, Bhagat,Bhavinkumar PA-C Pager [phone]   I have seen and examined the patient along with Bhagat,Bhavinkumar PA-C.  I have reviewed the chart, notes and new data.  I agree with PA's note.  PLAN: Steady progress with HD/volume removal. Vaginal bleeding almost stopped. Keep off anticoagulation until cause of bleeding is clearly identified, but impressed upon her that in the long run she needs anticoagulation to prevent stroke.  Thurmon Fair, MD, [facility] [phone] 07/15/2015, 12:05 PM

## 2015-07-15 NOTE — Progress Notes (Signed)
Pt a/o, pt c/o c/p at 2240, EKG done BP 119/53 HR 70, O2 100% on 2L O2, after EKG pt stated she was not having any pain, pt stable,

## 2015-07-15 NOTE — Progress Notes (Signed)
[facility] Progress Note   Subjective: 3.5 kg off w HD yest  Filed Vitals:   07/14/15 1900 07/14/15 2135 07/14/15 2241 07/15/15 0558  BP: 113/65 105/66 119/53 124/52  Pulse: 86 79 70 82  Temp: 98.6 F (37 C) 98.4 F (36.9 C)  98.2 F (36.8 C)  TempSrc:  Oral  Oral  Resp: Height:      Weight:    128.05 kg (282 lb 4.8 oz)  SpO2: 99% 99% 100% 97%   Exam: Alert, no distress, morbidly obese No jvd Chest dec'd R base, L clear RRR no MRG Abd obese, ntnd +bs Ext 2+ edema LE's and lower abd wall Neuro is alert, Ox 3   MWF [facility]   131kg  2/2 Bath  Heparin 6000   IJ cath Mircera 75 q 2 weeks last10/19- last hgb 10.4- also venofer 100 q tx.through 11/4   No PTH- calc 9.8, phos 4.5       Assessment: 1 Massive vol overload- down 40 lbs with another 40 to go 2 PEA arrest 10/27 3 ESRD recent start mwf ,using cath 4 Afib - new, back in NSR.  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 We will draw labs at dialysis, no need for additional peripheral stick   [name] MD [facility] pager [phone]    cell [phone] 07/15/2015, 10:03 AM    Recent Labs Lab 07/12/15 0505 07/12/15 1410 07/13/15 0510 07/13/15 2224 07/15/15 0220  NA 134* 135 131* 131* 135  K 3.9 3.9 4.0 4.1 4.2  CL 100* 99* 97* 94* 100*  CO2 GLUCOSE 144* 125* 140* 171* 195*  BUN 38* 41* 25* 30* 16  CREATININE 3.60* 3.73* 2.86* 3.14* 2.56*  CALCIUM 9.3 9.4 8.7* 9.2 9.2  PHOS 5.1* 5.3*  --  3.9  --     Recent Labs Lab 07/08/15 1631 07/09/15 0445 07/12/15 0505 07/12/15 1410 07/13/15 2224  AST 87* 53*  --   --   --   ALT 41 35  --   --   --   ALKPHOS 136* 119  --   --   --   BILITOT 2.2* 1.3*  --   --   --   PROT 6.6 6.6  --   --   --   ALBUMIN 2.8* 2.6* 2.8* 2.9* 3.0*    Recent  Labs Lab 07/12/15 1409  07/13/15 2224 07/14/15 0445 07/15/15 0220  WBC 9.6  < > 10.9* 12.5* 10.0  NEUTROABS 7.3  --   --   --   --   HGB 9.9*  < > 9.6* 10.6* 9.8*  HCT 31.5*  < > 30.7* 33.1* 32.3*  MCV 102.6*  < > 103.4* 100.6* 103.5*  PLT 172  < > 158 143* 133*  < > = values in this interval not displayed. Marland Kitchen antiseptic oral rinse  7 mL Mouth Rinse QID  . aspirin EC  81 mg Oral Daily  . chlorhexidine gluconate  15 mL Mouth Rinse BID  . darbepoetin (ARANESP) injection - DIALYSIS  100 mcg Intravenous Q Mon-HD  . docusate sodium  100 mg Oral BID  . ferric gluconate (FERRLECIT/NULECIT) IV  125 mg Intravenous Q M,W,F-HD  . insulin aspart  0-15  Units Subcutaneous TID WC  . insulin glargine  40 Units Subcutaneous BID  . levothyroxine  75 mcg Oral QAC breakfast  . metoprolol tartrate  12.5 mg Oral BID  . multivitamin  1 tablet Oral QHS  . pantoprazole  40 mg Oral Daily  . warfarin   Does not apply Once  . Warfarin - Pharmacist Dosing Inpatient   Does not apply q1800   . sodium chloride Stopped (07/12/15 1100)   acetaminophen **OR** acetaminophen, albuterol, fentaNYL (SUBLIMAZE) injection, hydrALAZINE, metoprolol, phenol

## 2015-07-15 NOTE — Clinical Social Work Placement (Signed)
   CLINICAL SOCIAL WORK PLACEMENT  NOTE  Date:  07/15/2015  Patient Details  Name: Kimberly [name] MRN: [id number] Date of Birth: 04-Jul-1956  Clinical Social Work is seeking post-discharge placement for this patient at the Skilled  Nursing Facility level of care (*CSW will initial, date and re-position this form in  chart as items are completed):  Yes   Patient/family provided with [place] Clinical Social Work Department's list of facilities offering this level of care within the geographic area requested by the patient (or if unable, by the patient's family).  Yes   Patient/family informed of their freedom to choose among providers that offer the needed level of care, that participate in Medicare, Medicaid or managed care program needed by the patient, have an available bed and are willing to accept the patient.  Yes   Patient/family informed of 's ownership interest in [facility] and [facility], as well as of the fact that they are under no obligation to receive care at these facilities.  PASRR submitted to EDS on       PASRR number received on       Existing PASRR number confirmed on 07/14/15     FL2 transmitted to all facilities in geographic area requested by pt/family on       FL2 transmitted to all facilities within larger geographic area on       Patient informed that his/her managed care company has contracts with or will negotiate with certain facilities, including the following:   Monia Pouch([employer])     Yes   Patient/family informed of bed offers received.  [facility]     Physician recommends and patient chooses bed at      Patient to be transferred to   on  .  Patient to be transferred to facility by [name])     Patient family notified on   of transfer.  Name of family member notified:        PHYSICIAN Please prepare priority discharge summary, including medications, Please sign FL2, Please  prepare prescriptions     Additional Comment:    _______________________________________________ Darylene Pricerowder, Maeley Matton T, [name] 07/15/2015, 4:56 PM

## 2015-07-15 NOTE — Progress Notes (Signed)
Physical Therapy Treatment Patient Details Name: Kimberly [name] MRN: [id number] DOB: 08-Jan-1956 Today's Date: 07/15/2015    History of Present Illness [age] yof with a PMH of HTN, HLD, CAD, COPD, CHF, DM, OSA, and ESRD secondary to DM (dialyzes MWF) [facility] right arm AV fistula creation. Pt was undergoing procedure when she suffered from a cardiac arrest 10/27. Estimated down time 4-6 minutes, minimal. Procedure aborted.     PT Comments    Pt very anxious about attempting sit to stand with RW, reporting she is just too weak. Rx limited to EOB.   Follow Up Recommendations  CIR     Equipment Recommendations       Recommendations for Other Services       Precautions / Restrictions Precautions Precautions: Fall    Mobility  Bed Mobility         Supine to sit: Mod assist Sit to supine: +2 for physical assistance;Mod assist   General bed mobility comments: heavy use of bed rail  Transfers                 General transfer comment: Pt unable to tolerate  Ambulation/Gait                 Stairs            Wheelchair Mobility    Modified Rankin (Stroke Patients Only)       Balance   Sitting-balance support: Bilateral upper extremity supported;Feet supported Sitting balance-Leahy Scale: Fair Sitting balance - Comments: Pt sat EOB min guard assist ~ 5 minutes requiring BUE support to maintain balance. Postural control: Right lateral lean                          Cognition Arousal/Alertness: Awake/alert Behavior During Therapy: Anxious Overall Cognitive Status: Within Functional Limits for tasks assessed                      Exercises      General Comments        Pertinent Vitals/Pain Faces Pain Scale: Hurts little more Pain Location: sternum Pain Descriptors / Indicators: Grimacing Pain Intervention(s): Monitored during session;Limited activity within patient's tolerance    Home Living                       Prior Function            PT Goals (current goals can now be found in the care plan section) Acute Rehab PT Goals Patient Stated Goal: to get stronger PT Goal Formulation: With patient Time For Goal Achievement: 07/25/15 Potential to Achieve Goals: Good Progress towards PT goals: Progressing toward goals    Frequency  Min 3X/week    PT Plan Current plan remains appropriate    Co-evaluation             End of Session   Activity Tolerance: Patient tolerated treatment well Patient left: in bed;with call bell/phone within reach     Time: [id number] PT Time Calculation (min) (ACUTE ONLY): 24 min  Charges:  $Therapeutic Activity: 23-37 mins                    G Codes:      Kimberly [name], Kimberly [name] 07/15/2015, 10:58 AM

## 2015-07-15 NOTE — NC FL2 (Signed)
[place] MEDICAID FL2 LEVEL OF CARE SCREENING TOOL     IDENTIFICATION  Patient Name: Kimberly [name] Birthdate: February 21, 1956 Sex: female Admission Date (Current Location): 07/08/2015  [place] and [place] Number: Haynes Bast [id number] Facility and Address:  [facility], [address] [place], [place] [address]      Provider Number: [id number]  Attending Physician Name and Address:  Eddie North, MD  Relative Name and Phone Number:       Current Level of Care: Hospital Recommended Level of Care: Skilled Nursing Facility Prior Approval Number:    Date Approved/Denied:   PASRR Number:    Discharge Plan: SNF    Current Diagnoses: Patient Active Problem List   Diagnosis Date Noted  . Altered mental status   . Pulmonary arterial hypertension (HCC)   . Acute on chronic diastolic congestive heart failure (HCC)   . Mitral stenosis   . Paroxysmal atrial fibrillation (HCC) 07/11/2015  . ESRD (end stage renal disease) (HCC) 07/08/2015  . Diabetic ulcer of right foot associated with diabetes mellitus due to underlying condition (HCC)   . Pressure ulcer 04/07/2015  . Obesity hypoventilation syndrome (HCC) 08/05/2014  . Morbid obesity (HCC) 06/28/2014  . Physical deconditioning 06/28/2014  . Mitral valve stenosis, moderate 06/11/2014  . Pulmonary HTN (HCC) 06/11/2014  . Hypothyroidism 06/10/2014  . Carotid stenosis 12/02/2013  . Varicose veins of lower extremities with other complications 12/02/2013  . Chronic respiratory failure with hypoxia (HCC) 10/13/2013  . On home oxygen therapy 10/13/2013  . Chronic diastolic CHF (congestive heart failure) (HCC) 09/12/2013  . CAD (coronary artery disease), native coronary artery   . DM (diabetes mellitus), type 2 with renal complications (HCC) 05/31/2010  . Hyperlipidemia associated with type 2 diabetes mellitus (HCC) 05/31/2010  . COPD (chronic obstructive pulmonary disease) (HCC) 05/31/2010  . Hypertensive  heart disease     Orientation ACTIVITIES/SOCIAL BLADDER RESPIRATION    Self, Time, Situation, Place  Active Continent Normal  BEHAVIORAL SYMPTOMS/MOOD NEUROLOGICAL BOWEL NUTRITION STATUS      Continent Diet (diet: renal/card)  PHYSICIAN VISITS COMMUNICATION OF NEEDS Height & Weight Skin  30 days Verbally  (162.6 cm) 282 lbs. Normal          AMBULATORY STATUS RESPIRATION    Supervision limited Normal      Personal Care Assistance Level of Assistance  Bathing, Feeding, Dressing Bathing Assistance: Independent Feeding assistance: Independent Dressing Assistance: Maximum assistance      Functional Limitations Info  Sight, Hearing, Speech Sight Info: Adequate Hearing Info: Adequate Speech Info: Adequate       SPECIAL CARE FACTORS FREQUENCY  PT (By licensed PT), OT (By licensed OT)     PT Frequency: 5x OT Frequency: 5x           Additional Factors Info  Code Status, Allergies Code Status Info: full Allergies Info: ciprofloxacin,epinephrine           Current Medications (07/15/2015): Current Facility-Administered Medications  Medication Dose Route Frequency Provider Last Rate Last Dose  . 0.9 %  sodium chloride infusion   Intravenous Continuous Erasmo Downer, MD   Stopped at 07/12/15 1100  . acetaminophen (TYLENOL) tablet 325-650 mg  325-650 mg Oral Q4H PRN Lars Mage, PA-C       Or  . acetaminophen (TYLENOL) suppository 325-650 mg  325-650 mg Rectal Q4H PRN Lars Mage, PA-C      . albuterol (PROVENTIL) (2.5 MG/3ML) 0.083% nebulizer solution 2.5 mg  2.5 mg Inhalation  Q6H PRN Lars MageEmma M Collins, PA-C   2.5 mg at 07/12/15 1912  . antiseptic oral rinse solution (CORINZ)  7 mL Mouth Rinse QID Simonne MartinetPeter E Babcock, NP   7 mL at 07/14/15 1200  . aspirin EC tablet 81 mg  81 mg Oral Daily Zigmund GottronElizabeth C Deterding, MD   Stopped at 07/14/15 1000  . chlorhexidine gluconate (PERIDEX) 0.12 % solution 15 mL  15 mL Mouth Rinse BID Simonne MartinetPeter E Babcock, NP   15 mL at 07/14/15  2000  . Darbepoetin Alfa (ARANESP) injection 100 mcg  100 mcg Intravenous Q Mon-HD Weston SettleMartha Bergman, PA-C   100 mcg at 07/12/15 1653  . docusate sodium (COLACE) capsule 100 mg  100 mg Oral BID Alyson ReedyWesam G Yacoub, MD   100 mg at 07/14/15 2247  . fentaNYL (SUBLIMAZE) injection 25-100 mcg  25-100 mcg Intravenous Q2H PRN Vilinda BlanksWilliam S Minor, NP      . ferric gluconate (NULECIT) 125 mg in sodium chloride 0.9 % 100 mL IVPB  125 mg Intravenous Q M,W,F-HD Camille Balynthia Dunham, MD   125 mg at 07/14/15 1200  . hydrALAZINE (APRESOLINE) injection 5 mg  5 mg Intravenous Q20 Min PRN Lars MageEmma M Collins, PA-C      . insulin aspart (novoLOG) injection 0-15 Units  0-15 Units Subcutaneous TID WC Nelda Bucksaniel J Feinstein, MD   3 Units at 07/15/15 [id number]  . insulin glargine (LANTUS) injection 40 Units  40 Units Subcutaneous BID Vilinda BlanksWilliam S Minor, NP   40 Units at 07/14/15 2235  . levothyroxine (SYNTHROID, LEVOTHROID) tablet 75 mcg  75 mcg Oral QAC breakfast Erasmo DownerAngela M Bacigalupo, MD   75 mcg at 07/14/15 0659  . metoprolol (LOPRESSOR) injection 2.5-5 mg  2.5-5 mg Intravenous Q3H PRN Vilinda BlanksWilliam S Minor, NP      . metoprolol tartrate (LOPRESSOR) tablet 12.5 mg  12.5 mg Oral BID Leslye Peerobert S Byrum, MD   12.5 mg at 07/14/15 2234  . multivitamin (RENA-VIT) tablet 1 tablet  1 tablet Oral QHS Weston SettleMartha Bergman, PA-C   1 tablet at 07/10/15 2156  . pantoprazole (PROTONIX) EC tablet 40 mg  40 mg Oral Daily Leslye Peerobert S Byrum, MD   40 mg at 07/14/15 1000  . phenol (CHLORASEPTIC) mouth spray 1 spray  1 spray Mouth/Throat PRN Lars MageEmma M Collins, PA-C      . warfarin (COUMADIN) video   Does not apply Once Clydia LlanoMutaz Elmahi, MD      . Warfarin - Pharmacist Dosing Inpatient   Does not apply q1800 Bertram MillardMichael A Maccia, RPH       Do not use this list as official medication orders. Please verify with discharge summary.  Discharge Medications:   Medication List    ASK your doctor about these medications        acetaminophen 325 MG tablet  Commonly known as:  TYLENOL  Take 650 mg by mouth  every 6 (six) hours as needed for mild pain.     albuterol 108 (90 BASE) MCG/ACT inhaler  Commonly known as:  PROVENTIL HFA;VENTOLIN HFA  Inhale 1-2 puffs into the lungs every 6 (six) hours as needed for wheezing or shortness of breath.     aspirin EC 81 MG tablet  Take 81 mg by mouth daily.     insulin aspart 100 UNIT/ML injection  Commonly known as:  novoLOG  Inject 6 Units into the skin 3 (three) times daily with meals.     insulin glargine 100 UNIT/ML injection  Commonly known as:  LANTUS  Inject 50 Units into  the skin 2 (two) times daily.     levothyroxine 75 MCG tablet  Commonly known as:  SYNTHROID, LEVOTHROID  Take 75 mcg by mouth daily before breakfast.     metoprolol succinate 50 MG 24 hr tablet  Commonly known as:  TOPROL-XL  Take 1 tablet (50 mg total) by mouth daily. Take 1 and 1/2 tabs daily = 75 mg daily     MULTIVITAMIN PO  Take 1 tablet by mouth daily.     oxyCODONE-acetaminophen 5-325 MG tablet  Commonly known as:  PERCOCET/ROXICET  Take 1 tablet by mouth every 6 (six) hours as needed.     potassium chloride SA 20 MEQ tablet  Commonly known as:  KLOR-CON M20  Take 2 tablets (40 mEq total) by mouth 2 (two) times daily.        Relevant Imaging Results:  Relevant Lab Results:  Recent Labs    Additional Information obesity  Catheryn Bacon  BSW intern  [phone]

## 2015-07-15 NOTE — Clinical Social Work Placement (Signed)
   CLINICAL SOCIAL WORK PLACEMENT  NOTE  Date: 07/14/2015 Patient Details  Name: Kimberly [name] MRN: [id number] Date of Birth: 1956-05-26  Clinical Social Work is seeking post-discharge placement for this patient at the Skilled  Nursing Facility level of care (*CSW will initial, date and re-position this form in  chart as items are completed):  Yes   Patient/family provided with [place] Clinical Social Work Department's list of facilities offering this level of care within the geographic area requested by the patient (or if unable, by the patient's family).  Yes   Patient/family informed of their freedom to choose among providers that offer the needed level of care, that participate in Medicare, Medicaid or managed care program needed by the patient, have an available bed and are willing to accept the patient.  Yes   Patient/family informed of 's ownership interest in [facility] and [facility], as well as of the fact that they are under no obligation to receive care at these facilities.  PASRR submitted to EDS on       PASRR number received on       Existing PASRR number confirmed on 07/14/15     FL2 transmitted to all facilities in geographic area requested by pt/family on       FL2 transmitted to all facilities within larger geographic area on       Patient informed that his/her managed care company has contracts with or will negotiate with certain facilities, including the following:   Monia Pouch([employer])         Patient/family informed of bed offers received.  Patient chooses bed at       Physician recommends and patient chooses bed at      Patient to be transferred to   on  .  Patient to be transferred to facility by Ambulance [name]([facility])     Patient family notified on   of transfer.  Name of family member notified:        PHYSICIAN Please prepare priority discharge summary, including medications, Please sign FL2, Please prepare prescriptions     Additional  Comment:    _______________________________________________ Darylene Pricerowder, Madai Nuccio T, [name] 07/14/2015  1:20 PM

## 2015-07-15 NOTE — Progress Notes (Signed)
[facility] PROGRESS NOTE  Kimberly ManilaSusan K [name] ZOX:[id number] DOB: 1956-06-06 DOA: 07/08/2015 PCP: Ginette OttoSTONEKING,[name] THOMAS, MD  Brief narrative 59 year old female with end-stage renal disease on dialysis started about 3 weeks back to her normal dialysis catheter presented to the hospital for an AV fistula placement but developed cardiac arrest and admitted to the ICU. She was transferred to the hospitalist service on 10/31. Patient is receiving scheduled hemodialysis for massive volume overload. Also started on oral anticoagulation for paroxysmal A. fib with high chads vascular score but held due to vaginal bleed.  Assessment/Plan: Cardiopulmonary arrest -6 minutes of cardio pulmonary arrest during AV fistula creation in the OR on 07/08/2015. She was admitted to the ICU, intubated and extubated on 10/28. Per cardiology it appears to be respiratory arrest by PCA. 2-D echo showed EF of 55 and 60% with grade 2 diastolic dysfunction.  Paroxysmal A. fib Rate controlled, continue metoprolol - CHA2DS2-VASc is 6 , Cardiology recommended anticoagulation.  - Started on oral Coumadin bridging with IV heparin but held due to vaginal bleeding.  Appreciate cardiology follow-up.  Severe pulmonary artery hypertension Multifactorial secondary to obstructive sleep apnea, obesity, COPD and end-stage renal disease. Heart catheterization in 2015 showed pulmonary venous hypertension. PA pressure of 78 mmHg per 2-D echo. Clinically improving with dialysis.  Acute on chronic diastolic CHF Volume management with dialysis. 2-D echo results as above.  ESRD on hemodialysis Recent started on dialysis through tunneled catheter. Unsuccessful attempt at placement of permanent history axis secondary to cardio pulmonary arrest. Not a candidate  for permanent access per vascular surgery.  Vaginal bleed, postmenopausal Pelvic ultrasound showing thickened endometrium (13.2 mm) without any underlying mass or lesions. Spoke  with GYN on-call Myna Hidalgo(Jennifer Ozan) scheduled for outpatient follow-up sometime early next week for endometrial biopsy. Still having small amount of vaginal bleeding the night. Hold off on anticoagulation until then. monitor H&H.  Deconditioning Seen by PT and recommends the IR/skilled nursing facility. Possible discharge next week   Code Status: full code Family Communication: husband at bedside  Disposition Plan: Skilled nursing facility possibly early next week   Consultants:  Renal  Cardiology  Vascular surgery  PCCM  Procedures:  Intubation  HD  2-D echo  Antibiotics:  none  HPI/Subjective: Seen and examined. Denies any shortness of breath  Objective: Filed Vitals:   07/15/15 0558  BP: 124/52  Pulse: 82  Temp: 98.2 F (36.8 C)  Resp: 20    Intake/Output Summary (Last 24 hours) at 07/15/15 1345 Last data filed at 07/15/15 1153  Gross per 24 hour  Intake    660 ml  Output   3501 ml  Net  -2841 ml   Filed Weights   07/14/15 0300 07/14/15 1545 07/15/15 0558  Weight: 130.1 kg (286 lb 13.1 oz) 131 kg (288 lb 12.8 oz) 128.05 kg (282 lb 4.8 oz)    Exam:   General:  Elderly obese female not in distress  HEENT: No pallor, no JVD, moist mucosa  Cardiovascular: Normal S1 and S2, no murmurs rub or gallop  Respiratory: Diminished breath sounds due to body habitus  Abdomen: Soft, nondistended, nontender, bowel sounds present  Musculoskeletal: Trace edema bilaterally  CNS: Alert and oriented  Data Reviewed: Basic Metabolic Panel:  Recent Labs Lab 07/10/15 0400  07/12/15 0505 07/12/15 1410 07/13/15 0510 07/13/15 2224 07/15/15 0220  NA 134*  < > 134* 135 131* 131* 135  K 3.5  < > 3.9 3.9 4.0 4.1 4.2  CL 97*  < > 100* 99*  97* 94* 100*  CO2 25  < > GLUCOSE 242*  < > 144* 125* 140* 171* 195*  BUN 20  < > 38* 41* 25* 30* 16  CREATININE 2.81*  < > 3.60* 3.73* 2.86* 3.14* 2.56*  CALCIUM 8.5*  < > 9.3 9.4 8.7* 9.2 9.2  MG 1.9   --   --   --   --   --   --   PHOS 3.0  --  5.1* 5.3*  --  3.9  --   < > = values in this interval not displayed. Liver Function Tests:  Recent Labs Lab 07/08/15 1631 07/09/15 0445 07/12/15 0505 07/12/15 1410 07/13/15 2224  AST 87* 53*  --   --   --   ALT 41 35  --   --   --   ALKPHOS 136* 119  --   --   --   BILITOT 2.2* 1.3*  --   --   --   PROT 6.6 6.6  --   --   --   ALBUMIN 2.8* 2.6* 2.8* 2.9* 3.0*   No results for input(s): LIPASE, AMYLASE in the last 168 hours. No results for input(s): AMMONIA in the last 168 hours. CBC:  Recent Labs Lab 07/12/15 1409 07/13/15 0510 07/13/15 2224 07-15-15 0445 07/15/15 0220  WBC 9.6 12.4* 10.9* 12.5* 10.0  NEUTROABS 7.3  --   --   --   --   HGB 9.9* 10.0* 9.6* 10.6* 9.8*  HCT 31.5* 31.0* 30.7* 33.1* 32.3*  MCV 102.6* 102.0* 103.4* 100.6* 103.5*  PLT 172 186 158 143* 133*   Cardiac Enzymes:  Recent Labs Lab 07/08/15 1502  07/10/15 1055 07/10/15 1630 07/10/15 2212 07/10/15 2327 07/11/15 0453  CKTOTAL 33*  --   --   --   --   --   --   CKMB 2.2  --   --   --   --   --   --   TROPONINI 0.07*  < > 0.08* 0.33* 0.96* 1.05* 1.10*  < > = values in this interval not displayed. BNP (last 3 results)  Recent Labs  04/06/15 1413 04/08/15 0526  BNP 510.5* 127.0*    ProBNP (last 3 results) No results for input(s): PROBNP in the last 8760 hours.  CBG:  Recent Labs Lab 07/15/2015 0353 2015/07/15 0635 07-15-15 1140 15-Jul-2015 2134 07/15/15 0600  GLUCAP 100* 108* 140* 166* 195*    Recent Results (from the past 240 hour(s))  Culture, Urine     Status: None   Collection Time: 07/08/15  5:05 PM  Result Value Ref Range Status   Specimen Description URINE, RANDOM  Final   Special Requests ADDED [id number]  Final   Culture NO GROWTH 1 DAY  Final   Report Status 07/10/2015 FINAL  Final     Studies: US Transvaginal Non-ob  07/15/15  CLINICAL DATA:  Vaginal bleeding. EXAM: TRANSABDOMINAL AND TRANSVAGINAL ULTRASOUND OF  PELVIS TECHNIQUE: Both transabdominal and transvaginal ultrasound examinations of the pelvis were performed. Transabdominal technique was performed for global imaging of the pelvis including uterus, ovaries, adnexal regions, and pelvic cul-de-sac. It was necessary to proceed with endovaginal exam following the transabdominal exam to visualize the cervix and endometrium to better advantage. COMPARISON:  None FINDINGS: Uterus Measurements: 5.5 x 3.8 x 4.1 cm. No fibroids or other mass visualized. Endometrium Thickness: 13.2 mm.  No focal abnormality visualized. Right ovary Not visualized.  No adnexal mass. Left  ovary Not visualized.  No adnexal mass. Other findings Trace pelvic free fluid. IMPRESSION: 1. Exam limited due to body habitus. 2. Uterus normal in size.  No uterine mass. 3. Endometrium measures 13.2 mm in thickness. No endometrial mass seen. In the setting of post-menopausal bleeding, endometrial sampling is indicated to exclude carcinoma. If results are benign, sonohysterogram should be considered for focal lesion work-up. (Ref: Radiological Reasoning: Algorithmic Workup of Abnormal Vaginal Bleeding with Endovaginal Sonography and Sonohysterography. AJR 2008; 161:W96-04) 4. Neither ovary visualized.  No adnexal masses. Electronically Signed   By: Amie Portland M.D.   On: 07/14/2015 12:50   US Pelvis Complete  07/14/2015  CLINICAL DATA:  Vaginal bleeding. EXAM: TRANSABDOMINAL AND TRANSVAGINAL ULTRASOUND OF PELVIS TECHNIQUE: Both transabdominal and transvaginal ultrasound examinations of the pelvis were performed. Transabdominal technique was performed for global imaging of the pelvis including uterus, ovaries, adnexal regions, and pelvic cul-de-sac. It was necessary to proceed with endovaginal exam following the transabdominal exam to visualize the cervix and endometrium to better advantage. COMPARISON:  None FINDINGS: Uterus Measurements: 5.5 x 3.8 x 4.1 cm. No fibroids or other mass visualized.  Endometrium Thickness: 13.2 mm.  No focal abnormality visualized. Right ovary Not visualized.  No adnexal mass. Left ovary Not visualized.  No adnexal mass. Other findings Trace pelvic free fluid. IMPRESSION: 1. Exam limited due to body habitus. 2. Uterus normal in size.  No uterine mass. 3. Endometrium measures 13.2 mm in thickness. No endometrial mass seen. In the setting of post-menopausal bleeding, endometrial sampling is indicated to exclude carcinoma. If results are benign, sonohysterogram should be considered for focal lesion work-up. (Ref: Radiological Reasoning: Algorithmic Workup of Abnormal Vaginal Bleeding with Endovaginal Sonography and Sonohysterography. AJR 2008; 540:J81-19) 4. Neither ovary visualized.  No adnexal masses. Electronically Signed   By: Amie Portland M.D.   On: 07/14/2015 12:50    Scheduled Meds: . antiseptic oral rinse  7 mL Mouth Rinse QID  . aspirin EC  81 mg Oral Daily  . chlorhexidine gluconate  15 mL Mouth Rinse BID  . darbepoetin (ARANESP) injection - DIALYSIS  100 mcg Intravenous Q Mon-HD  . docusate sodium  100 mg Oral BID  . ferric gluconate (FERRLECIT/NULECIT) IV  125 mg Intravenous Q M,W,F-HD  . insulin aspart  0-15 Units Subcutaneous TID WC  . insulin glargine  40 Units Subcutaneous BID  . levothyroxine  75 mcg Oral QAC breakfast  . metoprolol tartrate  12.5 mg Oral BID  . multivitamin  1 tablet Oral QHS  . pantoprazole  40 mg Oral Daily  . warfarin   Does not apply Once  . Warfarin - Pharmacist Dosing Inpatient   Does not apply q1800   Continuous Infusions: . sodium chloride Stopped (07/12/15 1100)      Time spent: 25 minutes    Verdella Laidlaw  [facility] Pager [phone]. If 7PM-7AM, please contact night-coverage at [email or web address], password [facility] 07/15/2015, 1:45 PM  LOS: 7 days

## 2015-07-16 LAB — CBC
HCT: 30.6 % — ABNORMAL LOW (ref 36.0–46.0)
Hemoglobin: 9.6 g/dL — ABNORMAL LOW (ref 12.0–15.0)
MCH: 32.5 pg (ref 26.0–34.0)
MCHC: 31.4 g/dL (ref 30.0–36.0)
MCV: 103.7 fL — ABNORMAL HIGH (ref 78.0–100.0)
PLATELETS: 126 10*3/uL — AB (ref 150–400)
RBC: 2.95 MIL/uL — AB (ref 3.87–5.11)
RDW: 20.8 % — AB (ref 11.5–15.5)
WBC: 10.4 10*3/uL (ref 4.0–10.5)

## 2015-07-16 LAB — PROTIME-INR
INR: 1.61 — AB (ref 0.00–1.49)
PROTHROMBIN TIME: 19.1 s — AB (ref 11.6–15.2)

## 2015-07-16 LAB — GLUCOSE, CAPILLARY
GLUCOSE-CAPILLARY: 109 mg/dL — AB (ref 65–99)
GLUCOSE-CAPILLARY: 115 mg/dL — AB (ref 65–99)
GLUCOSE-CAPILLARY: 168 mg/dL — AB (ref 65–99)
GLUCOSE-CAPILLARY: 295 mg/dL — AB (ref 65–99)
Glucose-Capillary: 223 mg/dL — ABNORMAL HIGH (ref 65–99)

## 2015-07-16 LAB — RENAL FUNCTION PANEL
ALBUMIN: 3.1 g/dL — AB (ref 3.5–5.0)
ANION GAP: 11 (ref 5–15)
BUN: 15 mg/dL (ref 6–20)
CO2: 27 mmol/L (ref 22–32)
Calcium: 9.5 mg/dL (ref 8.9–10.3)
Chloride: 96 mmol/L — ABNORMAL LOW (ref 101–111)
Creatinine, Ser: 3.06 mg/dL — ABNORMAL HIGH (ref 0.44–1.00)
GFR, EST AFRICAN AMERICAN: 18 mL/min — AB (ref >60)
GFR, EST NON AFRICAN AMERICAN: 16 mL/min — AB (ref >60)
Glucose, Bld: 176 mg/dL — ABNORMAL HIGH (ref 65–99)
PHOSPHORUS: 3.6 mg/dL (ref 2.5–4.6)
POTASSIUM: 3.5 mmol/L (ref 3.5–5.1)
Sodium: 134 mmol/L — ABNORMAL LOW (ref 135–145)

## 2015-07-16 MED ORDER — SODIUM CHLORIDE 0.9 % IV SOLN: 100.0000 mL | INTRAVENOUS | Status: DC | PRN

## 2015-07-16 MED ORDER — HEPARIN SODIUM (PORCINE) 1000 UNIT/ML DIALYSIS: 1000.0000 [IU] | INTRAMUSCULAR | Status: DC | PRN

## 2015-07-16 MED ORDER — MIDODRINE HCL 5 MG PO TABS
10.0000 mg | ORAL_TABLET | Freq: Two times a day (BID) | ORAL | Status: DC
  Administered 2015-07-16 – 2015-07-17 (×3): 10 mg via ORAL
  Filled 2015-07-16 (×3): qty 2

## 2015-07-16 MED ORDER — PENTAFLUOROPROP-TETRAFLUOROETH EX AERO: 1.0000 "application " | INHALATION_SPRAY | CUTANEOUS | Status: DC | PRN

## 2015-07-16 MED ORDER — LIDOCAINE HCL (PF) 1 % IJ SOLN
5.0000 mL | INTRAMUSCULAR | Status: DC | PRN
Start: 2015-07-16 — End: 2015-07-16

## 2015-07-16 MED ORDER — HEPARIN SODIUM (PORCINE) 1000 UNIT/ML DIALYSIS
5000.0000 [IU] | Freq: Once | INTRAMUSCULAR | Status: DC
Start: 2015-07-17 — End: 2015-07-16
  Filled 2015-07-16: qty 5

## 2015-07-16 MED ORDER — LIDOCAINE-PRILOCAINE 2.5-2.5 % EX CREA: 1.0000 "application " | TOPICAL_CREAM | CUTANEOUS | Status: DC | PRN

## 2015-07-16 MED ORDER — ALTEPLASE 2 MG IJ SOLR: 2.0000 mg | Freq: Once | INTRAMUSCULAR | Status: DC | PRN

## 2015-07-16 MED ORDER — MIDODRINE HCL 5 MG PO TABS
ORAL_TABLET | ORAL | Status: AC
  Administered 2015-07-16: 5 mg
  Filled 2015-07-16: qty 2

## 2015-07-16 MED ORDER — HEPARIN SODIUM (PORCINE) 1000 UNIT/ML DIALYSIS
5000.0000 [IU] | Freq: Once | INTRAMUSCULAR | Status: DC
  Filled 2015-07-16: qty 5

## 2015-07-16 MED ORDER — LIDOCAINE HCL (PF) 1 % IJ SOLN: 5.0000 mL | INTRAMUSCULAR | Status: DC | PRN

## 2015-07-16 MED ORDER — ALTEPLASE 2 MG IJ SOLR
2.0000 mg | Freq: Once | INTRAMUSCULAR | Status: DC | PRN
  Filled 2015-07-16: qty 2

## 2015-07-16 NOTE — Progress Notes (Signed)
[facility] Progress Note   Subjective: BP holding w edw reduction  Filed Vitals:   07/16/15 1030 07/16/15 1100 07/16/15 1130 07/16/15 1149  BP: 105/50 104/49 118/50 121/53  Pulse: 81 82 81 83  Temp:    97 F (36.1 C)  TempSrc:    Oral  Resp: Height:      Weight:    121.4 kg (267 lb 10.2 oz)  SpO2:    96%   Exam: Alert, no distress, morbidly obese No jvd Chest dec'd R base, L clear RRR no MRG Abd obese, ntnd +bs Ext 2+ edema LE's and lower abd wall Neuro is alert, Ox 3   MWF [place]   131kg  2/2 [place]  Heparin 6000   IJ cath Mircera 75 q 2 weeks last10/19- last hgb 10.4- also venofer 100 q tx.through 11/4   No PTH- calc 9.8, phos 4.5       Assessment: 1 Massive vol overload- down 15 kg from admit 2 PEA arrest 10/27 3 ESRD recent start mwf ,using cath 4 Afib - new, back in NSR.  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   Plan - HD Sat then resume MWF thereafter. Discussed fluid intake at length w pt.    [name] MD [facility] pager [phone]    cell [phone] 07/16/2015, 12:20 PM    Recent Labs Lab 07/12/15 1410  07/13/15 2224 07/15/15 0220 07/16/15 0845  NA 135  < > 131* 135 134*  K 3.9  < > 4.1 4.2 3.5  CL 99*  < > 94* 100* 96*  CO2 25  < > GLUCOSE 125*  < > 171* 195* 176*  BUN 41*  < > 30* 16 15  CREATININE 3.73*  < > 3.14* 2.56* 3.06*  CALCIUM 9.4  < > 9.2 9.2 9.5  PHOS 5.3*  --  3.9  --  3.6  < > = values in this interval not displayed.  Recent Labs Lab 07/12/15 1410 07/13/15 2224 07/16/15 0845  ALBUMIN 2.9* 3.0* 3.1*    Recent Labs Lab  07/12/15 1409  07/14/15 0445 07/15/15 0220 07/16/15 0308  WBC 9.6  < > 12.5* 10.0 10.4  NEUTROABS 7.3  --   --   --   --   HGB 9.9*  < > 10.6* 9.8* 9.6*  HCT 31.5*  < > 33.1* 32.3* 30.6*  MCV 102.6*  < > 100.6* 103.5* 103.7*  PLT 172  < > 143* 133* 126*  < > = values in this interval not displayed. Marland Kitchen antiseptic oral rinse  7 mL Mouth Rinse QID  . aspirin EC  81 mg Oral Daily  . chlorhexidine gluconate  15 mL Mouth Rinse BID  . darbepoetin (ARANESP) injection - DIALYSIS  100 mcg Intravenous Q Mon-HD  . docusate sodium  100 mg Oral BID  . ferric gluconate (FERRLECIT/NULECIT) IV  125 mg Intravenous Q M,W,F-HD  . insulin aspart  0-15 Units Subcutaneous TID WC  . insulin glargine  40 Units Subcutaneous BID  . levothyroxine  75 mcg Oral  QAC breakfast  . metoprolol tartrate  12.5 mg Oral BID  . midodrine      . midodrine  10 mg Oral BID WC  . multivitamin  1 tablet Oral QHS  . pantoprazole  40 mg Oral Daily  . warfarin   Does not apply Once   . sodium chloride Stopped (07/12/15 1100)   acetaminophen **OR** acetaminophen, albuterol, fentaNYL (SUBLIMAZE) injection, hydrALAZINE, metoprolol, phenol

## 2015-07-16 NOTE — Progress Notes (Signed)
Physical Therapy Treatment Patient Details Name: Kimberly ManilaSusan K Sutphin MRN: [id number] DOB: Feb 04, 1956 Today's Date: 07/16/2015    History of Present Illness [age] yof with a PMH of HTN, HLD, CAD, COPD, CHF, DM, OSA, and ESRD secondary to DM (dialyzes MWF) [facility] fistula creation. Pt was undergoing procedure when she suffered from a cardiac arrest 10/27.    PT Comments    Progressing towards therapy goals. Tolerated 2 short distance ambulatory bouts with physical assistance. Anxious and fatigues easily but motivated to work with therapy today. Tolerated exercises well. SpO2 95% on 2L supplemental O2. Patient will continue to benefit from skilled physical therapy services to further improve independence with functional mobility.   Follow Up Recommendations  SNF (Pt was not a candidate for CIR- needs SNF)     Equipment Recommendations   (TBD next venue of care)    Recommendations for Other Services       Precautions / Restrictions Precautions Precautions: Fall Precaution Comments: Painful L arm; history of chronic back pain Restrictions Weight Bearing Restrictions: No    Mobility  Bed Mobility Overal bed mobility: Needs Assistance Bed Mobility: Rolling;Sidelying to Sit;Sit to Sidelying Rolling: Min assist Sidelying to sit: Mod assist     Sit to sidelying: Min assist General bed mobility comments: Cues for log roll technique and use of rail as needed. Mod assist for truncal support to rise to seated position and min assist for LE support back into bed.  Transfers Overall transfer level: Needs assistance Equipment used: Rolling walker (2 wheeled) Transfers: Sit to/from Stand Sit to Stand: Min assist;+2 safety/equipment;From elevated surface         General transfer comment: Min assist with +2 for safety from elevated bed surface. VC for hand placement and technique. Performed x2.  Ambulation/Gait Ambulation/Gait assistance: Min assist;+2  safety/equipment Ambulation Distance (Feet): 18 Feet (x2) Assistive device: Rolling walker (2 wheeled) Gait Pattern/deviations: Step-through pattern;Decreased stride length;Trunk flexed;Wide base of support Gait velocity: slow Gait velocity interpretation: Below normal speed for age/gender General Gait Details: Min assist for walker control. VC for placement for proximity. Very fatigued with short distance but no buckling noted. Able to perform 2 bouts with a rest break inbetween. +2 assist for equipment and safety.   Stairs            Wheelchair Mobility    Modified Rankin (Stroke Patients Only)       Balance           Standing balance support: Bilateral upper extremity supported Standing balance-Leahy Scale: Poor                      Cognition Arousal/Alertness: Awake/alert Behavior During Therapy: WFL for tasks assessed/performed Overall Cognitive Status: Within Functional Limits for tasks assessed                      Exercises General Exercises - Lower Extremity Ankle Circles/Pumps: AROM;Both;5 reps;Supine Short Arc Quad: Strengthening;Both;5 reps;Supine Long Arc Quad: Strengthening;Both;10 reps;Seated    General Comments General comments (skin integrity, edema, etc.): SpO2 95% on 2L supplemental O2      Pertinent Vitals/Pain Pain Assessment: Faces Faces Pain Scale: Hurts little more Pain Location: back and RLE "skin" - no redness noted Pain Descriptors / Indicators:  (tearing) Pain Intervention(s): Monitored during session;Repositioned    Home Living  Prior Function            PT Goals (current goals can now be found in the care plan section) Acute Rehab PT Goals Patient Stated Goal: to get stronger PT Goal Formulation: With patient Time For Goal Achievement: 07/25/15 Potential to Achieve Goals: Good Progress towards PT goals: Progressing toward goals    Frequency  Min 2X/week    PT Plan  Frequency needs to be updated;Discharge plan needs to be updated    Co-evaluation             End of Session Equipment Utilized During Treatment: Oxygen Activity Tolerance: Patient limited by fatigue Patient left: in bed;with call bell/phone within reach;with bed alarm set     Time: 1410-1438 PT Time Calculation (min) (ACUTE ONLY): 28 min  Charges:  $Gait Training: 8-22 mins $Therapeutic Activity: 8-22 mins                    G Codes:      Berton Mount 07-25-15, 3:44 PM Sunday Spillers [place], [place] [phone]

## 2015-07-16 NOTE — Progress Notes (Signed)
Patient Name: Kimberly [name] Date of Encounter: 07/16/2015  Active Problems:   CAD (coronary artery disease), native coronary artery   Chronic diastolic CHF (congestive heart failure) (HCC)   Mitral valve stenosis, moderate   Pulmonary HTN (HCC)   Morbid obesity (HCC)   ESRD (end stage renal disease) (HCC)   Paroxysmal atrial fibrillation (HCC)   Altered mental status   Pulmonary arterial hypertension (HCC)   Acute on chronic diastolic congestive heart failure (HCC)   Mitral stenosis   Vaginal bleeding   Length of Stay: 8  SUBJECTIVE  Starting to have cramps and weakness towards the end of HD sessions. Vaginal bleeding slowed to a trickle she thinks. No recurrence of atrial fibrillation   CURRENT MEDS . antiseptic oral rinse  7 mL Mouth Rinse QID  . aspirin EC  81 mg Oral Daily  . chlorhexidine gluconate  15 mL Mouth Rinse BID  . darbepoetin (ARANESP) injection - DIALYSIS  100 mcg Intravenous Q Mon-HD  . docusate sodium  100 mg Oral BID  . ferric gluconate (FERRLECIT/NULECIT) IV  125 mg Intravenous Q M,W,F-HD  . [START ON 07/17/2015] heparin  5,000 Units Dialysis Once in dialysis  . [START ON 07/17/2015] heparin  5,000 Units Dialysis Once in dialysis  . insulin aspart  0-15 Units Subcutaneous TID WC  . insulin glargine  40 Units Subcutaneous BID  . levothyroxine  75 mcg Oral QAC breakfast  . metoprolol tartrate  12.5 mg Oral BID  . midodrine      . midodrine  10 mg Oral BID WC  . multivitamin  1 tablet Oral QHS  . pantoprazole  40 mg Oral Daily  . warfarin   Does not apply Once    OBJECTIVE   Intake/Output Summary (Last 24 hours) at 07/16/15 1125 Last data filed at 07/16/15 0600  Gross per 24 hour  Intake    400 ml  Output   3911 ml  Net  -3511 ml   Filed Weights   07/15/15 0558 07/16/15 0557 07/16/15 0807  Weight: 282 lb 4.8 oz (128.05 kg) 270 lb 8.1 oz (122.7 kg) 272 lb 11.3 oz (123.7 kg)    PHYSICAL EXAM Filed Vitals:   07/16/15 0900 07/16/15 0930  07/16/15 1000 07/16/15 1030  BP: 105/46 104/45 126/52 105/50  Pulse: 80 81 84 81  Temp:      TempSrc:      Resp:  22  22  Height:      Weight:      SpO2:       General: Alert, oriented x3, no distress, morbidly obese Head: no evidence of trauma, PERRL, EOMI, no exophtalmos or lid lag, no myxedema, no xanthelasma; normal ears, nose and oropharynx Neck: cannot see jugular venous pulsations or hepatojugular reflux; brisk carotid pulses without delay and no carotid bruits Chest: clear to auscultation, no signs of consolidation by percussion or palpation, normal fremitus, symmetrical and full respiratory excursions; chest ecchymosis from CPR and dialysis cath Cardiovascular: unable to find the apical impulse, regular rhythm, normal first and second heart sounds, no rubs or gallops, no murmur Abdomen: no tenderness or distention, no masses by palpation, no abnormal pulsatility or arterial bruits, normal bowel sounds, no hepatosplenomegaly Extremities: no clubbing, cyanosis; marked reduction in edema and lots of wrinkling;  Neurological: grossly nonfocal  LABS  CBC  Recent Labs  07/15/15 0220 07/16/15 0308  WBC 10.0 10.4  HGB 9.8* 9.6*  HCT 32.3* 30.6*  MCV 103.5* 103.7*  PLT 133* 126*  Basic Metabolic Panel  Recent Labs  07/13/15 2224 07/15/15 0220 07/16/15 0845  NA 131* 135 134*  K 4.1 4.2 3.5  CL 94* 100* 96*  CO2 26 26 27   GLUCOSE 171* 195* 176*  BUN 30* 16 15  CREATININE 3.14* 2.56* 3.06*  CALCIUM 9.2 9.2 9.5  PHOS 3.9  --  3.6   Liver Function Tests  Recent Labs  07/13/15 2224 07/16/15 0845  ALBUMIN 3.0* 3.1*    Radiology Studies Imaging results have been reviewed and [place] Transvaginal Non-ob  07/14/2015  CLINICAL DATA:  Vaginal bleeding. EXAM: TRANSABDOMINAL AND TRANSVAGINAL ULTRASOUND OF PELVIS TECHNIQUE: Both transabdominal and transvaginal ultrasound examinations of the pelvis were performed. Transabdominal technique was performed for global imaging of  the pelvis including uterus, ovaries, adnexal regions, and pelvic cul-de-sac. It was necessary to proceed with endovaginal exam following the transabdominal exam to visualize the cervix and endometrium to better advantage. COMPARISON:  None FINDINGS: Uterus Measurements: 5.5 x 3.8 x 4.1 cm. No fibroids or other mass visualized. Endometrium Thickness: 13.2 mm.  No focal abnormality visualized. Right ovary Not visualized.  No adnexal mass. Left ovary Not visualized.  No adnexal mass. Other findings Trace pelvic free fluid. IMPRESSION: 1. Exam limited due to body habitus. 2. Uterus normal in size.  No uterine mass. 3. Endometrium measures 13.2 mm in thickness. No endometrial mass seen. In the setting of post-menopausal bleeding, endometrial sampling is indicated to exclude carcinoma. If results are benign, sonohysterogram should be considered for focal lesion work-up. (Ref: Radiological Reasoning: Algorithmic Workup of Abnormal Vaginal Bleeding with Endovaginal Sonography and Sonohysterography. AJR 2008; 409:[id number]) 4. Neither ovary visualized.  No adnexal masses. Electronically Signed   By: Amie Portlandavid  Ormond M.D.   On: 07/14/2015 12:50   [place] Pelvis Complete  07/14/2015  CLINICAL DATA:  Vaginal bleeding. EXAM: TRANSABDOMINAL AND TRANSVAGINAL ULTRASOUND OF PELVIS TECHNIQUE: Both transabdominal and transvaginal ultrasound examinations of the pelvis were performed. Transabdominal technique was performed for global imaging of the pelvis including uterus, ovaries, adnexal regions, and pelvic cul-de-sac. It was necessary to proceed with endovaginal exam following the transabdominal exam to visualize the cervix and endometrium to better advantage. COMPARISON:  None FINDINGS: Uterus Measurements: 5.5 x 3.8 x 4.1 cm. No fibroids or other mass visualized. Endometrium Thickness: 13.2 mm.  No focal abnormality visualized. Right ovary Not visualized.  No adnexal mass. Left ovary Not visualized.  No adnexal mass. Other findings Trace  pelvic free fluid. IMPRESSION: 1. Exam limited due to body habitus. 2. Uterus normal in size.  No uterine mass. 3. Endometrium measures 13.2 mm in thickness. No endometrial mass seen. In the setting of post-menopausal bleeding, endometrial sampling is indicated to exclude carcinoma. If results are benign, sonohysterogram should be considered for focal lesion work-up. (Ref: Radiological Reasoning: Algorithmic Workup of Abnormal Vaginal Bleeding with Endovaginal Sonography and Sonohysterography. AJR 2008; 478:[id number]) 4. Neither ovary visualized.  No adnexal masses. Electronically Signed   By: Amie Portlandavid  Ormond M.D.   On: 07/14/2015 12:50    TELE NSR    ASSESSMENT AND PLAN  1. Cardiopulmonary arrest - respiratory arrest clearly preceded cardiac arrest (PEA). She received very little sedation before her local anesthetic was administered (1 mg Versed + Propofol 25 mcg/kg/min for a few minutes), but has had problems with exaggerated response to sedation before.  2. Paroxysmal atrial fibrillation - she has frequently had sustained irregular palpitations at home before the current diagnostic arrhythmia. High risk for embolic stroke. Has had a previous stroke. All major stroke  risk factors are present except age, CHADS Vasc 6. Anticoagulation on hold due to vaginal bleeding. Plan to restart Eliquis for atrial fibrillation after her gynecological workup is completed.. Maintaining sinus rhythm.   3. Diastolic HF, acute on chronic - now on daily HD. 12L net fluid negative since admission. Weight 272, down 12.5 kg since 10/28. Previous estimated dry weight was 250 lb.  4. Severe pulmonary artery HTN - at cath 2015 was mostly due to pulmonary venous HTN (diastolic LV dysfunction +/- mild mitral stenosis), but there was also a component of intrinsic pulmonary arteriolar disease (obesity, OSA, COPD).  5. ESRD. Still hypervolemic, HD may be limited by BP, especially low DBP. Can give beta blocker after HD or use 1/2  dose predialysis if this is a problem.  6. Vaginal bleeding Began while on IV heparin and warfarin initiation, now stopped.     Thurmon Fair, MD, [facility] [phone] office [phone] pager 07/16/2015 11:25 AM

## 2015-07-16 NOTE — Clinical Social Work Placement (Signed)
   CLINICAL SOCIAL WORK PLACEMENT  NOTE  Date:  07/16/2015  Patient Details  Name: Kimberly [name] MRN: [id number] Date of Birth: 06/15/1956  Clinical Social Work is seeking post-discharge placement for this patient at the Skilled  Nursing Facility level of care (*CSW will initial, date and re-position this form in  chart as items are completed):  Yes   Patient/family provided with [place] Clinical Social Work Department's list of facilities offering this level of care within the geographic area requested by the patient (or if unable, by the patient's family).  Yes   Patient/family informed of their freedom to choose among providers that offer the needed level of care, that participate in Medicare, Medicaid or managed care program needed by the patient, have an available bed and are willing to accept the patient.  Yes   Patient/family informed of [facility] ownership interest in [facility] and [facility], as well as of the fact that they are under no obligation to receive care at these facilities.  PASRR submitted to EDS on       PASRR number received on       Existing PASRR number confirmed on 07/14/15     FL2 transmitted to all facilities in geographic area requested by pt/family on 07/16/15     FL2 transmitted to all facilities within larger geographic area on       Patient informed that his/her managed care company has contracts with or will negotiate with certain facilities, including the following:   Monia Pouch([employer])     Yes   Patient/family informed of bed offers received.  [facility]([place] offered but current does not have an Engineer, [employer] Contract. Had to withdraw bed offer.)     Physician recommends and patient chooses bed at      Patient to be transferred to   on  .  Patient to be transferred to facility by Ambulance [name]([facility])     Patient family notified on   of transfer.  Name of family member notified:         PHYSICIAN Please prepare priority discharge summary, including medications, Please sign FL2, Please prepare prescriptions     Additional Comment:    _______________________________________________ Darylene Pricerowder, [name], [name] 07/16/2015, 5:00 PM

## 2015-07-16 NOTE — [place] FL2 (Signed)
[place] MEDICAID FL2 LEVEL OF CARE SCREENING TOOL     IDENTIFICATION  Patient Name: Kimberly [name] Birthdate: Mar 27, 1956 Sex: female Admission Date (Current Location): 07/08/2015  [place] and [place] Number: Haynes Bast [id number] Facility and Address:  [facility], [address] [name], [place] [address]      Provider Number: [id number]  Attending Physician Name and Address:  Eddie North, MD  Relative Name and Phone Number:       Current Level of Care: Hospital Recommended Level of Care: Skilled Nursing Facility Prior Approval Number:    Date Approved/Denied:   PASRR Number:  [id number]  Discharge Plan: SNF    Current Diagnoses: Patient Active Problem List   Diagnosis Date Noted  . Vaginal bleeding   . Altered mental status   . Pulmonary arterial hypertension (HCC)   . Acute on chronic diastolic congestive heart failure (HCC)   . Mitral stenosis   . Paroxysmal atrial fibrillation (HCC) 07/11/2015  . ESRD (end stage renal disease) (HCC) 07/08/2015  . Diabetic ulcer of right foot associated with diabetes mellitus due to underlying condition (HCC)   . Pressure ulcer 04/07/2015  . Obesity hypoventilation syndrome (HCC) 08/05/2014  . Morbid obesity (HCC) 06/28/2014  . Physical deconditioning 06/28/2014  . Mitral valve stenosis, moderate 06/11/2014  . Pulmonary HTN (HCC) 06/11/2014  . Hypothyroidism 06/10/2014  . Carotid stenosis 12/02/2013  . Varicose veins of lower extremities with other complications 12/02/2013  . Chronic respiratory failure with hypoxia (HCC) 10/13/2013  . On home oxygen therapy 10/13/2013  . Chronic diastolic CHF (congestive heart failure) (HCC) 09/12/2013  . CAD (coronary artery disease), native coronary artery   . DM (diabetes mellitus), type 2 with renal complications (HCC) 05/31/2010  . Hyperlipidemia associated with type 2 diabetes mellitus (HCC) 05/31/2010  . COPD (chronic obstructive pulmonary disease)  (HCC) 05/31/2010  . Hypertensive heart disease     Orientation ACTIVITIES/SOCIAL BLADDER RESPIRATION    Self, Time, Situation, Place  Active Continent Normal  BEHAVIORAL SYMPTOMS/MOOD NEUROLOGICAL BOWEL NUTRITION STATUS      Continent Diet (diet: renal/card)  PHYSICIAN VISITS COMMUNICATION OF NEEDS Height & Weight Skin  30 days Verbally  (162.6 cm) 282 lbs. Normal          AMBULATORY STATUS RESPIRATION    Supervision limited Normal      Personal Care Assistance Level of Assistance  Bathing, Feeding, Dressing Bathing Assistance: Independent Feeding assistance: Independent Dressing Assistance: Maximum assistance      Functional Limitations Info  Sight, Hearing, Speech Sight Info: Adequate Hearing Info: Adequate Speech Info: Adequate       SPECIAL CARE FACTORS FREQUENCY  PT (By licensed PT), OT (By licensed OT)     PT Frequency: 5x OT Frequency: 5x           Additional Factors Info  Code Status, Allergies Code Status Info: full Allergies Info: ciprofloxacin,epinephrine           Current Medications (07/16/2015): Current Facility-Administered Medications  Medication Dose Route Frequency Provider Last Rate Last Dose  . 0.9 %  sodium chloride infusion   Intravenous Continuous Erasmo Downer, MD   Stopped at 07/12/15 1100  . acetaminophen (TYLENOL) tablet 325-650 mg  325-650 mg Oral Q4H PRN Lars Mage, PA-C       Or  . acetaminophen (TYLENOL) suppository 325-650 mg  325-650 mg Rectal Q4H PRN Lars Mage, PA-C      . albuterol (PROVENTIL) (2.5 MG/3ML) 0.083% nebulizer solution 2.5  mg  2.5 mg Inhalation Q6H PRN Lars Mage, PA-C   2.5 mg at 07/12/15 1912  . antiseptic oral rinse solution (CORINZ)  7 mL Mouth Rinse QID Simonne Martinet, NP   7 mL at 07/16/15 1305  . aspirin EC tablet 81 mg  81 mg Oral Daily Zigmund Gottron, MD   81 mg at 07/16/15 1256  . chlorhexidine gluconate (PERIDEX) 0.12 % solution 15 mL  15 mL Mouth Rinse BID Simonne Martinet, NP   15 mL at 07/15/15 0800  . Darbepoetin Alfa (ARANESP) injection 100 mcg  100 mcg Intravenous Q Mon-HD Weston Settle, PA-C   100 mcg at 07/12/15 1653  . docusate sodium (COLACE) capsule 100 mg  100 mg Oral BID Alyson Reedy, MD   100 mg at 07/14/15 2247  . fentaNYL (SUBLIMAZE) injection 25-100 mcg  25-100 mcg Intravenous Q2H PRN Vilinda Blanks Minor, NP      . ferric gluconate (NULECIT) 125 mg in sodium chloride 0.9 % 100 mL IVPB  125 mg Intravenous Q M,W,F-HD Camille Bal, MD   125 mg at 07/16/15 1002  . hydrALAZINE (APRESOLINE) injection 5 mg  5 mg Intravenous Q20 Min PRN Lars Mage, PA-C      . insulin aspart (novoLOG) injection 0-15 Units  0-15 Units Subcutaneous TID WC Nelda Bucks, MD   3 Units at 07/16/15 1740  . insulin glargine (LANTUS) injection 40 Units  40 Units Subcutaneous BID Vilinda Blanks Minor, NP   40 Units at 07/16/15 1301  . levothyroxine (SYNTHROID, LEVOTHROID) tablet 75 mcg  75 mcg Oral QAC breakfast Erasmo Downer, MD   75 mcg at 07/16/15 1256  . metoprolol (LOPRESSOR) injection 2.5-5 mg  2.5-5 mg Intravenous Q3H PRN Vilinda Blanks Minor, NP      . metoprolol tartrate (LOPRESSOR) tablet 12.5 mg  12.5 mg Oral BID Leslye Peer, MD   12.5 mg at 07/16/15 1256  . midodrine (PROAMATINE) tablet 10 mg  10 mg Oral BID WC Delano Metz, MD   10 mg at 07/16/15 1007  . multivitamin (RENA-VIT) tablet 1 tablet  1 tablet Oral QHS Weston Settle, PA-C   1 tablet at 07/15/15 2149  . pantoprazole (PROTONIX) EC tablet 40 mg  40 mg Oral Daily Leslye Peer, MD   40 mg at 07/16/15 1256  . phenol (CHLORASEPTIC) mouth spray 1 spray  1 spray Mouth/Throat PRN Lars Mage, PA-C      . warfarin (COUMADIN) video   Does not apply Once Clydia Llano, MD       Do not use this list as official medication orders. Please verify with discharge summary.  Discharge Medications:   Medication List    ASK your doctor about these medications        acetaminophen 325 MG tablet  Commonly  known as:  TYLENOL  Take 650 mg by mouth every 6 (six) hours as needed for mild pain.     albuterol 108 (90 BASE) MCG/ACT inhaler  Commonly known as:  PROVENTIL HFA;VENTOLIN HFA  Inhale 1-2 puffs into the lungs every 6 (six) hours as needed for wheezing or shortness of breath.     aspirin EC 81 MG tablet  Take 81 mg by mouth daily.     insulin aspart 100 UNIT/ML injection  Commonly known as:  novoLOG  Inject 6 Units into the skin 3 (three) times daily with meals.     insulin glargine 100 UNIT/ML injection  Commonly known  as:  LANTUS  Inject 50 Units into the skin 2 (two) times daily.     levothyroxine 75 MCG tablet  Commonly known as:  SYNTHROID, LEVOTHROID  Take 75 mcg by mouth daily before breakfast.     metoprolol succinate 50 MG 24 hr tablet  Commonly known as:  TOPROL-XL  Take 1 tablet (50 mg total) by mouth daily. Take 1 and 1/2 tabs daily = 75 mg daily     MULTIVITAMIN PO  Take 1 tablet by mouth daily.     oxyCODONE-acetaminophen 5-325 MG tablet  Commonly known as:  PERCOCET/ROXICET  Take 1 tablet by mouth every 6 (six) hours as needed.     potassium chloride SA 20 MEQ tablet  Commonly known as:  KLOR-CON M20  Take 2 tablets (40 mEq total) by mouth 2 (two) times daily.        Relevant Imaging Results:  Relevant Lab Results:  Recent Labs    Additional Information obesity  Darylene PriceCrowder, Philander Ake T, [name]

## 2015-07-16 NOTE — Progress Notes (Signed)
[facility] PROGRESS NOTE  Kimberly ManilaSusan K [name] BJY:[id number] DOB: 28-May-1956 DOA: 07/08/2015 PCP: Ginette OttoSTONEKING,[name] THOMAS, MD  Brief narrative 59 year old female with end-stage renal disease on dialysis started about 3 weeks back to her normal dialysis catheter presented to the hospital for an AV fistula placement but developed cardiac arrest and admitted to the ICU. She was transferred to the hospitalist service on 10/31. Patient is receiving scheduled hemodialysis for massive volume overload. Also started on oral anticoagulation for paroxysmal A. fib with high chads vascular score but held due to vaginal bleed.  Assessment/Plan: Cardiopulmonary arrest -pt had 6 minutes of cardio pulmonary arrest during AV fistula creation in the OR on 07/08/2015. She was admitted to the ICU, intubated and extubated on 10/28. Per cardiology it appears to be respiratory arrest by PCA. 2-D echo showed EF of 55 and 60% with grade 2 diastolic dysfunction.  Paroxysmal A. fib Rate controlled, continue metoprolol - CHA2DS2-VASc is 6 , Cardiology recommended anticoagulation.  - Started on oral Coumadin bridging with IV heparin but held due to vaginal bleeding.  Appreciate cardiology follow-up.  Severe pulmonary artery hypertension Multifactorial secondary to obstructive sleep apnea, obesity, COPD and end-stage renal disease. Heart catheterization in 2015 showed pulmonary venous hypertension. PA pressure of 78 mmHg per 2-D echo. Clinically improving with dialysis.  Acute on chronic diastolic CHF Volume management with dialysis. 2-D echo results as above.  ESRD on hemodialysis Recent started on dialysis through tunneled catheter. Unsuccessful attempt at placement of permanent history axis secondary to cardio pulmonary arrest. Not a candidate  for permanent access per vascular surgery.  Vaginal bleed, postmenopausal Pelvic ultrasound showing thickened endometrium (13.2 mm) without any underlying mass or lesions.  Spoke with GYN dR  Myna HidalgoJennifer Ozan who will plan to  schedule for outpatient follow-up sometime early next week for endometrial biopsy. No vaginal bleeding past 24 hrs. . Hold off on anticoagulation until GYN workup. monitor H&H.  Physical Deconditioning Seen by PT and recommends skilled nursing facility. Possible discharge next week   Code Status: full code Family Communication: None at bedside  Disposition Plan: Skilled nursing facility possibly early next week   Consultants:  Renal  Cardiology  Vascular surgery  PCCM  Procedures:  Intubation  HD  2-D echo  Antibiotics:  none  HPI/Subjective: Seen and examined while at HD. Marland Kitchen. Denies any shortness of breath or vaginal bleed  Objective: Filed Vitals:   07/16/15 1226  BP: 142/54  Pulse: 81  Temp:   Resp: 18    Intake/Output Summary (Last 24 hours) at 07/16/15 1317 Last data filed at 07/16/15 1149  Gross per 24 hour  Intake    340 ml  Output   6474 ml  Net  -6134 ml   Filed Weights   07/16/15 0557 07/16/15 0807 07/16/15 1149  Weight: 122.7 kg (270 lb 8.1 oz) 123.7 kg (272 lb 11.3 oz) 121.4 kg (267 lb 10.2 oz)    Exam:   General:  Elderly obese female not in distress  HEENT: , moist mucosa  Cardiovascular: Normal S1 and S2, no murmurs rub or gallop  Respiratory: Diminished breath sounds due to body habitus  Abdomen: Soft, nondistended, nontender, bowel sounds present  Musculoskeletal: warm  CNS: Alert and oriented  Data Reviewed: Basic Metabolic Panel:  Recent Labs Lab 07/10/15 0400  07/12/15 0505 07/12/15 1410 07/13/15 0510 07/13/15 2224 07/15/15 0220 07/16/15 0845  NA 134*  < > 134* 135 131* 131* 135 134*  K 3.5  < > 3.9 3.9 4.0 4.1  4.2 3.5  CL 97*  < > 100* 99* 97* 94* 100* 96*  CO2 25  < > GLUCOSE 242*  < > 144* 125* 140* 171* 195* 176*  BUN 20  < > 38* 41* 25* 30* 16 15  CREATININE 2.81*  < > 3.60* 3.73* 2.86* 3.14* 2.56* 3.06*  CALCIUM 8.5*  < > 9.3 9.4  8.7* 9.2 9.2 9.5  MG 1.9  --   --   --   --   --   --   --   PHOS 3.0  --  5.1* 5.3*  --  3.9  --  3.6  < > = values in this interval not displayed. Liver Function Tests:  Recent Labs Lab 07/12/15 0505 07/12/15 1410 07/13/15 2224 07/16/15 0845  ALBUMIN 2.8* 2.9* 3.0* 3.1*   No results for input(s): LIPASE, AMYLASE in the last 168 hours. No results for input(s): AMMONIA in the last 168 hours. CBC:  Recent Labs Lab 07/12/15 1409 07/13/15 0510 07/13/15 2224 07/14/15 0445 07/15/15 0220 07/16/15 0308  WBC 9.6 12.4* 10.9* 12.5* 10.0 10.4  NEUTROABS 7.3  --   --   --   --   --   HGB 9.9* 10.0* 9.6* 10.6* 9.8* 9.6*  HCT 31.5* 31.0* 30.7* 33.1* 32.3* 30.6*  MCV 102.6* 102.0* 103.4* 100.6* 103.5* 103.7*  PLT 172 186 158 143* 133* 126*   Cardiac Enzymes:  Recent Labs Lab 07/10/15 1055 07/10/15 1630 07/10/15 2212 07/10/15 2327 07/11/15 0453  TROPONINI 0.08* 0.33* 0.96* 1.05* 1.10*   BNP (last 3 results)  Recent Labs  04/06/15 1413 04/08/15 0526  BNP 510.5* 127.0*    ProBNP (last 3 results) No results for input(s): PROBNP in the last 8760 hours.  CBG:  Recent Labs Lab 07/15/15 1151 07/15/15 2059 07/16/15 0001 07/16/15 0555 07/16/15 1259  GLUCAP 201* 133* 295* 223* 109*    Recent Results (from the past 240 hour(s))  Culture, Urine     Status: None   Collection Time: 07/08/15  5:05 PM  Result Value Ref Range Status   Specimen Description URINE, RANDOM  Final   Special Requests ADDED [id number]  Final   Culture NO GROWTH 1 DAY  Final   Report Status 07/10/2015 FINAL  Final     Studies: No results found.  Scheduled Meds: . antiseptic oral rinse  7 mL Mouth Rinse QID  . aspirin EC  81 mg Oral Daily  . chlorhexidine gluconate  15 mL Mouth Rinse BID  . darbepoetin (ARANESP) injection - DIALYSIS  100 mcg Intravenous Q Mon-HD  . docusate sodium  100 mg Oral BID  . ferric gluconate (FERRLECIT/NULECIT) IV  125 mg Intravenous Q M,W,F-HD  . insulin  aspart  0-15 Units Subcutaneous TID WC  . insulin glargine  40 Units Subcutaneous BID  . levothyroxine  75 mcg Oral QAC breakfast  . metoprolol tartrate  12.5 mg Oral BID  . midodrine      . midodrine  10 mg Oral BID WC  . multivitamin  1 tablet Oral QHS  . pantoprazole  40 mg Oral Daily  . warfarin   Does not apply Once   Continuous Infusions: . sodium chloride Stopped (07/12/15 1100)      Time spent: 25 minutes    Fontaine Hehl  [facility] Pager [phone]. If 7PM-7AM, please contact night-coverage at [email or web address], password [facility] 07/16/2015, 1:17 PM  LOS: 8 days

## 2015-07-17 LAB — PROTIME-INR
INR: 1.43 (ref 0.00–1.49)
Prothrombin Time: 17.5 seconds — ABNORMAL HIGH (ref 11.6–15.2)

## 2015-07-17 LAB — RENAL FUNCTION PANEL
ALBUMIN: 3.1 g/dL — AB (ref 3.5–5.0)
ANION GAP: 10 (ref 5–15)
BUN: 17 mg/dL (ref 6–20)
CALCIUM: 9.3 mg/dL (ref 8.9–10.3)
CO2: 26 mmol/L (ref 22–32)
Chloride: 96 mmol/L — ABNORMAL LOW (ref 101–111)
Creatinine, Ser: 3.03 mg/dL — ABNORMAL HIGH (ref 0.44–1.00)
GFR, EST AFRICAN AMERICAN: 18 mL/min — AB (ref >60)
GFR, EST NON AFRICAN AMERICAN: 16 mL/min — AB (ref >60)
Glucose, Bld: 113 mg/dL — ABNORMAL HIGH (ref 65–99)
PHOSPHORUS: 4.3 mg/dL (ref 2.5–4.6)
POTASSIUM: 3.2 mmol/L — AB (ref 3.5–5.1)
Sodium: 132 mmol/L — ABNORMAL LOW (ref 135–145)

## 2015-07-17 LAB — CBC
HCT: 33.6 % — ABNORMAL LOW (ref 36.0–46.0)
HEMOGLOBIN: 10.7 g/dL — AB (ref 12.0–15.0)
MCH: 31.9 pg (ref 26.0–34.0)
MCHC: 31.8 g/dL (ref 30.0–36.0)
MCV: 100.3 fL — ABNORMAL HIGH (ref 78.0–100.0)
Platelets: 142 10*3/uL — ABNORMAL LOW (ref 150–400)
RBC: 3.35 MIL/uL — ABNORMAL LOW (ref 3.87–5.11)
RDW: 20.4 % — ABNORMAL HIGH (ref 11.5–15.5)
WBC: 11.6 10*3/uL — ABNORMAL HIGH (ref 4.0–10.5)

## 2015-07-17 LAB — GLUCOSE, CAPILLARY
GLUCOSE-CAPILLARY: 113 mg/dL — AB (ref 65–99)
Glucose-Capillary: 113 mg/dL — ABNORMAL HIGH (ref 65–99)
Glucose-Capillary: 171 mg/dL — ABNORMAL HIGH (ref 65–99)
Glucose-Capillary: 266 mg/dL — ABNORMAL HIGH (ref 65–99)

## 2015-07-17 MED ORDER — MIDODRINE HCL 5 MG PO TABS
ORAL_TABLET | ORAL | Status: AC
  Administered 2015-07-17: 10 mg via ORAL
  Filled 2015-07-17: qty 2

## 2015-07-17 NOTE — Progress Notes (Signed)
[facility] Progress Note   Subjective: BP holding w edw reduction  Filed Vitals:   07/17/15 0930 07/17/15 1000 07/17/15 1029 07/17/15 1100  BP: 108/47 124/59 111/52 125/58  Pulse: 78 78 77 79  Temp:    97.6 F (36.4 C)  TempSrc:      Resp:    20  Height:      Weight:      SpO2:    99%   Exam: Alert, no distress, morbidly obese No jvd Chest dec'd R base, L clear RRR no MRG Abd obese, ntnd +bs Ext 2+ edema LE's and lower abd wall Neuro is alert, Ox 3   MWF [facility]   131kg  2/2 Bath  Heparin 6000   IJ cath Mircera 75 q 2 weeks last10/19- last hgb 10.4- also venofer 100 q tx.through 11/4   No PTH- calc 9.8, phos 4.5       Assessment: 1 Massive vol overload- down 15 kg from admit 2 PEA arrest 10/27 3 ESRD recent start mwf ,using cath 4 Afib - new, back in NSR.  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 8 Vasc access- prob not candidate for perm access per VVS 9 COPD/ OHS home O2 10 DM2 11 EOL - discussed briefly with patient, likelihood of a poor prognosis with the early access problems she is having, anasarca, cardiac arrest, etc., she said she wants to do everything she can to live 5412 Dispo - ok for dc at anytime from our standpoint now  Plan - HD today   Vinson Moselleob Jahel Wavra MD [facility] pager [phone]    cell [phone] 07/17/2015, 4:53 PM    Recent Labs Lab 07/13/15 2224 07/15/15 0220 07/16/15 0845 07/17/15 0707  NA 131* 135 134* 132*  K 4.1 4.2 3.5 3.2*  CL 94* 100* 96* 96*  CO2 26 26 27 26   GLUCOSE 171* 195* 176* 113*  BUN 30* 16 15 17   CREATININE 3.14* 2.56* 3.06* 3.03*  CALCIUM 9.2 9.2 9.5 9.3  PHOS 3.9  --  3.6 4.3    Recent Labs Lab 07/13/15 2224 07/16/15 0845 07/17/15 0707  ALBUMIN 3.0* 3.1* 3.1*    Recent Labs Lab 07/12/15 1409  07/15/15 0220 07/16/15 0308 07/17/15 0707  WBC 9.6  < > 10.0 10.4 11.6*  NEUTROABS 7.3  --   --   --   --    HGB 9.9*  < > 9.8* 9.6* 10.7*  HCT 31.5*  < > 32.3* 30.6* 33.6*  MCV 102.6*  < > 103.5* 103.7* 100.3*  PLT 172  < > 133* 126* 142*  < > = values in this interval not displayed. Marland Kitchen. antiseptic oral rinse  7 mL Mouth Rinse QID  . aspirin EC  81 mg Oral Daily  . chlorhexidine gluconate  15 mL Mouth Rinse BID  . darbepoetin (ARANESP) injection - DIALYSIS  100 mcg Intravenous Q Mon-HD  . docusate sodium  100 mg Oral BID  . ferric gluconate (FERRLECIT/NULECIT) IV  125 mg Intravenous Q M,W,F-HD  . insulin aspart  0-15 Units Subcutaneous TID WC  . insulin glargine  40 Units Subcutaneous BID  . levothyroxine  75 mcg Oral QAC breakfast  . metoprolol tartrate  12.5 mg Oral BID  . midodrine  10 mg Oral BID WC  . multivitamin  1 tablet Oral QHS  . pantoprazole  40 mg Oral Daily  . warfarin  Does not apply Once   . sodium chloride Stopped (07/12/15 1100)   acetaminophen **OR** acetaminophen, albuterol, fentaNYL (SUBLIMAZE) injection, hydrALAZINE, metoprolol, phenol

## 2015-07-17 NOTE — Progress Notes (Signed)
Patient tranferred to dialysis this morning and dialysis nurse will draw morning labs.

## 2015-07-17 NOTE — Progress Notes (Signed)
[facility] PROGRESS NOTE  Kimberly ManilaSusan K [name] WGN:[id number]:[id number] DOB: 09/11/1956 DOA: 07/08/2015 PCP: Ginette OttoSTONEKING,[name] THOMAS, MD  Brief narrative 59 year old female with end-stage renal disease on dialysis started about 3 weeks back to her normal dialysis catheter presented to the hospital for an AV fistula placement but developed cardiac arrest and admitted to the ICU. She was transferred to the hospitalist service on 10/31. Patient is receiving scheduled hemodialysis for massive volume overload. Also started on oral anticoagulation for paroxysmal A. fib with high chads vascular score but held due to vaginal bleed.  Assessment/Plan: Cardiopulmonary arrest -pt had 6 minutes of cardio pulmonary arrest during AV fistula creation in the OR on 07/08/2015. She was admitted to the ICU, intubated and extubated on 10/28. Per cardiology it appears to be respiratory arrest by PCA. 2-D echo showed EF of 55 and 60% with grade 2 diastolic dysfunction.  Paroxysmal A. fib Rate controlled, continue metoprolol - CHA2DS2-VASc is 6 , Cardiology recommended anticoagulation.  - Started on oral Coumadin bridging with IV heparin but held due to vaginal bleeding.  Appreciate cardiology follow-up.  Severe pulmonary artery hypertension Multifactorial secondary to obstructive sleep apnea, obesity, COPD and end-stage renal disease. Heart catheterization in 2015 showed pulmonary venous hypertension. PA pressure of 78 mmHg per 2-D echo. Clinically improving with dialysis.  Acute on chronic diastolic CHF Volume management with dialysis. 2-D echo results as above.  ESRD on hemodialysis Recent started on dialysis through tunneled catheter. Unsuccessful attempt at placement of permanent history axis secondary to cardio pulmonary arrest. Not a candidate  for permanent access per vascular surgery.  Vaginal bleed, postmenopausal Pelvic ultrasound showing thickened endometrium (13.2 mm) without any underlying mass or lesions.  Spoke with GYN dR  Myna HidalgoJennifer Ozan who planned  to schedule for outpatient follow-up sometime early next week for endometrial biopsy.-patient is morbidly obese and deconditioned and is concerned about having difficulty with positioning for exam as outpt. Will consult GYN again to see if this could be done while inpt. Hold off on anticoagulation until GYN workup. monitor H&H.  Physical Deconditioning Seen by PT and recommends skilled nursing facility. Possible discharge next week   Code Status: full code Family Communication: None at bedside  Disposition Plan: Skilled nursing facility possibly early next week   Consultants:  Renal  Cardiology  Vascular surgery  PCCM  Procedures:  Intubation  HD  2-D echo  Antibiotics:  none  HPI/Subjective: Seen and examined reports an episode of vaginal bleed last evening  Objective: Filed Vitals:   07/17/15 1100  BP: 125/58  Pulse: 79  Temp: 97.6 F (36.4 C)  Resp: 20    Intake/Output Summary (Last 24 hours) at 07/17/15 1522 Last data filed at 07/17/15 1400  Gross per 24 hour  Intake    480 ml  Output   3415 ml  Net  -2935 ml   Filed Weights   07/16/15 0807 07/16/15 1149 07/17/15 0648  Weight: 123.7 kg (272 lb 11.3 oz) 121.4 kg (267 lb 10.2 oz) 121.5 kg (267 lb 13.7 oz)    Exam:   General:  Elderly obese female not in distress  HEENT: , moist mucosa  Cardiovascular: Normal S1 and S2, no murmurs   Respiratory: Diminished breath sounds due to body habitus  Abdomen: Soft, nondistended, nontender,   Musculoskeletal: warm, no edema  CNS: Alert and oriented  Data Reviewed: Basic Metabolic Panel:  Recent Labs Lab 07/12/15 0505 07/12/15 1410 07/13/15 0510 07/13/15 2224 07/15/15 0220 07/16/15 0845 07/17/15 0707  NA 134* 135  131* 131* 135 134* 132*  K 3.9 3.9 4.0 4.1 4.2 3.5 3.2*  CL 100* 99* 97* 94* 100* 96* 96*  CO2 GLUCOSE 144* 125* 140* 171* 195* 176* 113*  BUN 38* 41* 25*  30* CREATININE 3.60* 3.73* 2.86* 3.14* 2.56* 3.06* 3.03*  CALCIUM 9.3 9.4 8.7* 9.2 9.2 9.5 9.3  PHOS 5.1* 5.3*  --  3.9  --  3.6 4.3   Liver Function Tests:  Recent Labs Lab 07/12/15 0505 07/12/15 1410 07/13/15 2224 07/16/15 0845 07/17/15 0707  ALBUMIN 2.8* 2.9* 3.0* 3.1* 3.1*   No results for input(s): LIPASE, AMYLASE in the last 168 hours. No results for input(s): AMMONIA in the last 168 hours. CBC:  Recent Labs Lab 07/12/15 1409  07/13/15 2224 07/14/15 0445 07/15/15 0220 07/16/15 0308 07/17/15 0707  WBC 9.6  < > 10.9* 12.5* 10.0 10.4 11.6*  NEUTROABS 7.3  --   --   --   --   --   --   HGB 9.9*  < > 9.6* 10.6* 9.8* 9.6* 10.7*  HCT 31.5*  < > 30.7* 33.1* 32.3* 30.6* 33.6*  MCV 102.6*  < > 103.4* 100.6* 103.5* 103.7* 100.3*  PLT 172  < > 158 143* 133* 126* 142*  < > = values in this interval not displayed. Cardiac Enzymes:  Recent Labs Lab 07/10/15 1630 07/10/15 2212 07/10/15 2327 07/11/15 0453  TROPONINI 0.33* 0.96* 1.05* 1.10*   BNP (last 3 results)  Recent Labs  04/06/15 1413 04/08/15 0526  BNP 510.5* 127.0*    ProBNP (last 3 results) No results for input(s): PROBNP in the last 8760 hours.  CBG:  Recent Labs Lab 07/16/15 1259 07/16/15 1611 07/16/15 2303 07/17/15 0638 07/17/15 1121  GLUCAP 109* 168* 115* 113* 113*    Recent Results (from the past 240 hour(s))  Culture, Urine     Status: None   Collection Time: 07/08/15  5:05 PM  Result Value Ref Range Status   Specimen Description URINE, RANDOM  Final   Special Requests ADDED [id number]  Final   Culture NO GROWTH 1 DAY  Final   Report Status 07/10/2015 FINAL  Final     Studies: No results found.  Scheduled Meds: . antiseptic oral rinse  7 mL Mouth Rinse QID  . aspirin EC  81 mg Oral Daily  . chlorhexidine gluconate  15 mL Mouth Rinse BID  . darbepoetin (ARANESP) injection - DIALYSIS  100 mcg Intravenous Q Mon-HD  . docusate sodium  100 mg Oral BID  . ferric gluconate  (FERRLECIT/NULECIT) IV  125 mg Intravenous Q M,W,F-HD  . insulin aspart  0-15 Units Subcutaneous TID WC  . insulin glargine  40 Units Subcutaneous BID  . levothyroxine  75 mcg Oral QAC breakfast  . metoprolol tartrate  12.5 mg Oral BID  . midodrine  10 mg Oral BID WC  . multivitamin  1 tablet Oral QHS  . pantoprazole  40 mg Oral Daily  . warfarin   Does not apply Once   Continuous Infusions: . sodium chloride Stopped (07/12/15 1100)      Time spent: 25 minutes    Geneva Barrero  [facility] Pager [phone]. If 7PM-7AM, please contact night-coverage at [email or web address], password [facility] 07/17/2015, 3:22 PM  LOS: 9 days

## 2015-07-17 NOTE — Progress Notes (Signed)
Patient ID: Kimberly [name], female   DOB: 1956-09-07, 59 y.o.   MRN: [id number]  Patient Name: Kimberly [name] Date of Encounter: 07/17/2015  Active Problems:   CAD (coronary artery disease), native coronary artery   Chronic diastolic CHF (congestive heart failure) (HCC)   Mitral valve stenosis, moderate   Pulmonary HTN (HCC)   Morbid obesity (HCC)   ESRD (end stage renal disease) (HCC)   Paroxysmal atrial fibrillation (HCC)   Altered mental status   Pulmonary arterial hypertension (HCC)   Acute on chronic diastolic congestive heart failure (HCC)   Mitral stenosis   Vaginal bleeding   Length of Stay: 9  SUBJECTIVE  For dialysis today    CURRENT MEDS . antiseptic oral rinse  7 mL Mouth Rinse QID  . aspirin EC  81 mg Oral Daily  . chlorhexidine gluconate  15 mL Mouth Rinse BID  . darbepoetin (ARANESP) injection - DIALYSIS  100 mcg Intravenous Q Mon-HD  . docusate sodium  100 mg Oral BID  . ferric gluconate (FERRLECIT/NULECIT) IV  125 mg Intravenous Q M,W,F-HD  . insulin aspart  0-15 Units Subcutaneous TID WC  . insulin glargine  40 Units Subcutaneous BID  . levothyroxine  75 mcg Oral QAC breakfast  . metoprolol tartrate  12.5 mg Oral BID  . midodrine  10 mg Oral BID WC  . multivitamin  1 tablet Oral QHS  . pantoprazole  40 mg Oral Daily  . warfarin   Does not apply Once    OBJECTIVE   Intake/Output Summary (Last 24 hours) at 07/17/15 1022 Last data filed at 07/17/15 0234  Gross per 24 hour  Intake    240 ml  Output   2664 ml  Net  -2424 ml   Filed Weights   07/16/15 0807 07/16/15 1149 07/17/15 0648  Weight: 123.7 kg (272 lb 11.3 oz) 121.4 kg (267 lb 10.2 oz) 121.5 kg (267 lb 13.7 oz)    PHYSICAL EXAM Filed Vitals:   07/17/15 0700 07/17/15 0730 07/17/15 0800 07/17/15 0830  BP: 128/58 108/54 109/53 114/52  Pulse: 75 74 74 77  Temp:      TempSrc:      Resp:      Height:      Weight:      SpO2:       General: Alert, oriented x3, no distress, morbidly  obese Head: no evidence of trauma, PERRL, EOMI, no exophtalmos or lid lag, no myxedema, no xanthelasma; normal ears, nose and oropharynx Neck: cannot see jugular venous pulsations or hepatojugular reflux; brisk carotid pulses without delay and no carotid bruits Chest: clear to auscultation, no signs of consolidation by percussion or palpation, normal fremitus, symmetrical and full respiratory excursions; chest ecchymosis from CPR and dialysis cath Cardiovascular: unable to find the apical impulse, regular rhythm, normal first and second heart sounds, no rubs or gallops, no murmur Abdomen: no tenderness or distention, no masses by palpation, no abnormal pulsatility or arterial bruits, normal bowel sounds, no hepatosplenomegaly Extremities: no clubbing, cyanosis; marked reduction in edema and lots of wrinkling;  Neurological: grossly nonfocal  LABS  CBC  Recent Labs  07/16/15 0308 07/17/15 0707  WBC 10.4 11.6*  HGB 9.6* 10.7*  HCT 30.6* 33.6*  MCV 103.7* 100.3*  PLT 126* 142*   Basic Metabolic Panel  Recent Labs  07/16/15 0845 07/17/15 0707  NA 134* 132*  K 3.5 3.2*  CL 96* 96*  CO2 27 26  GLUCOSE 176* 113*  BUN 15 17  CREATININE 3.06* 3.03*  CALCIUM 9.5 9.3  PHOS 3.6 4.3   Liver Function Tests  Recent Labs  07/16/15 0845 07/17/15 0707  ALBUMIN 3.1* 3.1*    Radiology Studies Imaging results have been reviewed and No results found.  TELE NSR    ASSESSMENT AND PLAN  1. Cardiopulmonary arrest - respiratory arrest clearly preceded cardiac arrest (PEA). She received very little sedation before her local anesthetic was administered (1 mg Versed + Propofol 25 mcg/kg/min for a few minutes), but has had problems with exaggerated response to sedation before. No plan for EP testing   2. Paroxysmal atrial fibrillation - she has frequently had sustained irregular palpitations at home before the current diagnostic arrhythmia. High risk for embolic stroke. Has had a  previous stroke. All major stroke risk factors are present except age, CHADS Vasc 6. Anticoagulation on hold due to vaginal bleeding. Plan to restart Eliquis for atrial fibrillation after her gynecological workup is completed.. Maintaining sinus rhythm.   3. Diastolic HF, acute on chronic - now on daily HD. 12L net fluid negative since admission. Weight 272, down 12.5 kg since 10/28. Previous estimated dry weight was 250 lb.  4. Severe pulmonary artery HTN - at cath 2015 was mostly due to pulmonary venous HTN (diastolic LV dysfunction +/- mild mitral stenosis), but there was also a component of intrinsic pulmonary arteriolar disease (obesity, OSA, COPD).  5. ESRD. Extra session today then back to M/W/Friday   6. Vaginal bleeding Began while on IV heparin and warfarin initiation, now stopped. Needs w/u before anticoagulation restarted    Charlton Haws

## 2015-07-18 LAB — CBC
HEMATOCRIT: 36.3 % (ref 36.0–46.0)
Hemoglobin: 11.6 g/dL — ABNORMAL LOW (ref 12.0–15.0)
MCH: 31.6 pg (ref 26.0–34.0)
MCHC: 32 g/dL (ref 30.0–36.0)
MCV: 98.9 fL (ref 78.0–100.0)
PLATELETS: 162 10*3/uL (ref 150–400)
RBC: 3.67 MIL/uL — AB (ref 3.87–5.11)
RDW: 20.7 % — ABNORMAL HIGH (ref 11.5–15.5)
WBC: 14 10*3/uL — AB (ref 4.0–10.5)

## 2015-07-18 LAB — PROTIME-INR
INR: 1.39 (ref 0.00–1.49)
Prothrombin Time: 17.2 seconds — ABNORMAL HIGH (ref 11.6–15.2)

## 2015-07-18 LAB — GLUCOSE, CAPILLARY
GLUCOSE-CAPILLARY: 160 mg/dL — AB (ref 65–99)
GLUCOSE-CAPILLARY: 240 mg/dL — AB (ref 65–99)
GLUCOSE-CAPILLARY: 252 mg/dL — AB (ref 65–99)
Glucose-Capillary: 207 mg/dL — ABNORMAL HIGH (ref 65–99)

## 2015-07-18 MED ORDER — INSULIN ASPART 100 UNIT/ML ~~LOC~~ SOLN
0.0000 [IU] | Freq: Three times a day (TID) | SUBCUTANEOUS | Status: DC
  Administered 2015-07-18: 5 [IU] via SUBCUTANEOUS
  Administered 2015-07-18: 3 [IU] via SUBCUTANEOUS
  Administered 2015-07-18: 8 [IU] via SUBCUTANEOUS
  Administered 2015-07-19: 5 [IU] via SUBCUTANEOUS
  Administered 2015-07-19: 2 [IU] via SUBCUTANEOUS
  Administered 2015-07-19: 3 [IU] via SUBCUTANEOUS
  Administered 2015-07-20: 5 [IU] via SUBCUTANEOUS
  Administered 2015-07-20: 8 [IU] via SUBCUTANEOUS

## 2015-07-18 MED ORDER — MIDODRINE HCL 5 MG PO TABS
10.0000 mg | ORAL_TABLET | Freq: Two times a day (BID) | ORAL | Status: DC
  Administered 2015-07-18 – 2015-07-20 (×4): 10 mg via ORAL
  Filled 2015-07-18 (×5): qty 2

## 2015-07-18 NOTE — Progress Notes (Signed)
[name] Progress Note   Subjective: no complaints  Filed Vitals:   07/17/15 2017 07/18/15 0411 07/18/15 0853 07/18/15 1100  BP: 119/43 134/51 115/59 147/60  Pulse: 85 88 86 100  Temp: 98 F (36.7 C) 98 F (36.7 C)    TempSrc: Oral Oral    Resp: 18 18    Height:      Weight:  117.8 kg (259 lb 11.2 oz)    SpO2: 98% 98% 97%    Exam: Alert, no distress, morbidly obese No jvd Chest dec'd R base, L clear RRR no MRG Abd obese, ntnd +bs Ext 2+ edema LE's and lower abd wall Neuro is alert, Ox 3   MWF [facility]   131kg  2/2 [place]  Heparin 6000   IJ cath Mircera 75 q 2 weeks last10/19- last hgb 10.4- also venofer 100 q tx.through 11/4   No PTH- calc 9.8, phos 4.5       Assessment: 1 Massive vol overload- significant improvement, ready for dc  2 PEA arrest 10/27 3 ESRD recent start mwf ,using cath 4 Afib - new, back in NSR.  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 pager [phone]    cell [phone] 07/18/2015, 2:23 PM    Recent Labs Lab 07/13/15 2224 07/15/15 0220 07/16/15 0845 07/17/15 0707  NA 131* 135 134* 132*  K 4.1 4.2 3.5 3.2*  CL 94* 100* 96* 96*  CO2 GLUCOSE 171* 195* 176* 113*  BUN 30* CREATININE 3.14* 2.56* 3.06* 3.03*  CALCIUM 9.2 9.2 9.5 9.3  PHOS 3.9  --  3.6 4.3    Recent Labs Lab 07/13/15 2224 07/16/15 0845 07/17/15 0707  ALBUMIN 3.0* 3.1* 3.1*    Recent Labs Lab 07/12/15 1409  07/16/15 0308 07/17/15 0707 07/18/15 0100  WBC 9.6  < > 10.4 11.6* 14.0*   NEUTROABS 7.3  --   --   --   --   HGB 9.9*  < > 9.6* 10.7* 11.6*  HCT 31.5*  < > 30.6* 33.6* 36.3  MCV 102.6*  < > 103.7* 100.3* 98.9  PLT 172  < > 126* 142* 162  < > = values in this interval not displayed. Marland Kitchen antiseptic oral rinse  7 mL Mouth Rinse QID  . aspirin EC  81 mg Oral Daily  . chlorhexidine gluconate  15 mL Mouth Rinse BID  . darbepoetin (ARANESP) injection - DIALYSIS  100 mcg Intravenous Q Mon-HD  . docusate sodium  100 mg Oral BID  . ferric gluconate (FERRLECIT/NULECIT) IV  125 mg Intravenous Q M,W,F-HD  . insulin aspart  0-15 Units Subcutaneous TID WC  . insulin glargine  40 Units Subcutaneous BID  . levothyroxine  75 mcg Oral QAC breakfast  . metoprolol tartrate  12.5 mg Oral BID  . midodrine  10 mg Oral BID WC  . multivitamin  1 tablet Oral QHS  . pantoprazole  40 mg Oral Daily  .  warfarin   Does not apply Once   . sodium chloride Stopped (07/12/15 1100)   acetaminophen **OR** acetaminophen, albuterol, fentaNYL (SUBLIMAZE) injection, hydrALAZINE, metoprolol, phenol

## 2015-07-18 NOTE — Progress Notes (Signed)
[facility] PROGRESS NOTE  Kimberly [name] BJY:[id number] DOB: Jul 28, 1956 DOA: 07/08/2015 PCP: [name], MD  Brief narrative 59 year old female with end-stage renal disease on dialysis started about 3 weeks back to her normal dialysis catheter presented to the hospital for an AV fistula placement but developed cardiac arrest and admitted to the ICU. She was transferred to the hospitalist service on 10/31. Patient is receiving scheduled hemodialysis for massive volume overload. Also started on oral anticoagulation for paroxysmal A. fib with high chads vascular score but held due to vaginal bleed.  Assessment/Plan: Cardiopulmonary arrest -pt had 6 minutes of cardio pulmonary arrest during AV fistula creation in the OR on 07/08/2015. She was admitted to the ICU, intubated and extubated on 10/28. Per cardiology it appears to be respiratory arrest by PCA. 2-D echo showed EF of 55 and 60% with grade 2 diastolic dysfunction.  Paroxysmal A. fib Rate controlled, continue metoprolol - CHA2DS2-VASc is 6 , Cardiology recommended anticoagulation.  - Started on oral Coumadin bridging with IV heparin but held due to vaginal bleeding.  Appreciate cardiology follow-up.  Severe pulmonary artery hypertension Multifactorial secondary to obstructive sleep apnea, obesity, COPD and end-stage renal disease. Heart catheterization in 2015 showed pulmonary venous hypertension. PA pressure of 78 mmHg per 2-D echo. Clinically improving with dialysis.  Acute on chronic diastolic CHF Volume management with dialysis. 2-D echo results as above.  ESRD on hemodialysis Recent started on dialysis through tunneled catheter. Unsuccessful attempt at placement of permanent history axis secondary to cardio pulmonary arrest. Not a candidate  for permanent access per vascular surgery.  Vaginal bleed, postmenopausal Pelvic ultrasound showing thickened endometrium (13.2 mm) without any underlying mass or lesions.  Having off-and-on scanty vaginal bleed. Spoke with GYN Dr  Myna Hidalgo who planned  to schedule for outpatient follow-up sometime early next week for endometrial biopsy.-patient is morbidly obese and deconditioned and is concerned about having difficulty with positioning for exam as outpt. Will consult GYN again to see if this could be done while inpt. Hold off on anticoagulation until GYN workup. H&H stable.  Physical Deconditioning Seen by PT and recommends skilled nursing facility.    Code Status: full code Family Communication: None at bedside  Disposition Plan: Skilled nursing facility possibly tomorrow.   Consultants:  Renal  Cardiology  Vascular surgery  PCCM  Procedures:  Intubation  HD  2-D echo  Antibiotics:  none  HPI/Subjective: Seen and examined . Still having scanty vaginal date off-and-on.  Objective: Filed Vitals:   07/18/15 0853  BP: 115/59  Pulse: 86  Temp:   Resp:     Intake/Output Summary (Last 24 hours) at 07/18/15 1039 Last data filed at 07/18/15 0900  Gross per 24 hour  Intake    840 ml  Output      0 ml  Net    840 ml   Filed Weights   07/16/15 1149 07/17/15 0648 07/18/15 0411  Weight: 121.4 kg (267 lb 10.2 oz) 121.5 kg (267 lb 13.7 oz) 117.8 kg (259 lb 11.2 oz)    Exam:   General:  Elderly obese female not in distress  HEENT: , moist mucosa  Cardiovascular: Normal S1 and S2, no murmurs   Respiratory: Diminished breath sounds due to body habitus  Abdomen: Soft, nondistended, nontender,   Musculoskeletal: warm, no edema    Data Reviewed: Basic Metabolic Panel:  Recent Labs Lab 07/12/15 0505 07/12/15 1410 07/13/15 0510 07/13/15 2224 07/15/15 0220 07/16/15 0845 07/17/15 0707  NA 134* 135 131* 131*  135 134* 132*  K 3.9 3.9 4.0 4.1 4.2 3.5 3.2*  CL 100* 99* 97* 94* 100* 96* 96*  CO2 25 25 25 26 26 27 26   GLUCOSE 144* 125* 140* 171* 195* 176* 113*  BUN 38* 41* 25* 30* 16 15 17   CREATININE 3.60* 3.73*  2.86* 3.14* 2.56* 3.06* 3.03*  CALCIUM 9.3 9.4 8.7* 9.2 9.2 9.5 9.3  PHOS 5.1* 5.3*  --  3.9  --  3.6 4.3   Liver Function Tests:  Recent Labs Lab 07/12/15 0505 07/12/15 1410 07/13/15 2224 07/16/15 0845 07/17/15 0707  ALBUMIN 2.8* 2.9* 3.0* 3.1* 3.1*   No results for input(s): LIPASE, AMYLASE in the last 168 hours. No results for input(s): AMMONIA in the last 168 hours. CBC:  Recent Labs Lab 07/12/15 1409  07/14/15 0445 07/15/15 0220 07/16/15 0308 07/17/15 0707 07/18/15 0100  WBC 9.6  < > 12.5* 10.0 10.4 11.6* 14.0*  NEUTROABS 7.3  --   --   --   --   --   --   HGB 9.9*  < > 10.6* 9.8* 9.6* 10.7* 11.6*  HCT 31.5*  < > 33.1* 32.3* 30.6* 33.6* 36.3  MCV 102.6*  < > 100.6* 103.5* 103.7* 100.3* 98.9  PLT 172  < > 143* 133* 126* 142* 162  < > = values in this interval not displayed. Cardiac Enzymes: No results for input(s): CKTOTAL, CKMB, CKMBINDEX, TROPONINI in the last 168 hours. BNP (last 3 results)  Recent Labs  04/06/15 1413 04/08/15 0526  BNP 510.5* 127.0*    ProBNP (last 3 results) No results for input(s): PROBNP in the last 8760 hours.  CBG:  Recent Labs Lab 07/17/15 0638 07/17/15 1121 07/17/15 1630 07/17/15 2043 07/18/15 0609  GLUCAP 113* 113* 171* 266* 160*    Recent Results (from the past 240 hour(s))  Culture, Urine     Status: None   Collection Time: 07/08/15  5:05 PM  Result Value Ref Range Status   Specimen Description URINE, RANDOM  Final   Special Requests ADDED [id number]  Final   Culture NO GROWTH 1 DAY  Final   Report Status 07/10/2015 FINAL  Final     Studies: No results found.  Scheduled Meds: . antiseptic oral rinse  7 mL Mouth Rinse QID  . aspirin EC  81 mg Oral Daily  . chlorhexidine gluconate  15 mL Mouth Rinse BID  . darbepoetin (ARANESP) injection - DIALYSIS  100 mcg Intravenous Q Mon-HD  . docusate sodium  100 mg Oral BID  . ferric gluconate (FERRLECIT/NULECIT) IV  125 mg Intravenous Q M,W,F-HD  . insulin aspart   0-15 Units Subcutaneous TID WC  . insulin glargine  40 Units Subcutaneous BID  . levothyroxine  75 mcg Oral QAC breakfast  . metoprolol tartrate  12.5 mg Oral BID  . midodrine  10 mg Oral BID WC  . multivitamin  1 tablet Oral QHS  . pantoprazole  40 mg Oral Daily  . warfarin   Does not apply Once   Continuous Infusions: . sodium chloride Stopped (07/12/15 1100)      Time spent: 25 minutes    Takya Vandivier  [facility] Pager [phone]. If 7PM-7AM, please contact night-coverage at [email or web address], password [email or web address] 07/18/2015, 10:39 AM  LOS: 10 days

## 2015-07-19 ENCOUNTER — Other Ambulatory Visit: Payer: Self-pay | Admitting: Obstetrics & Gynecology

## 2015-07-19 LAB — CBC
HCT: 34.7 % — ABNORMAL LOW (ref 36.0–46.0)
Hemoglobin: 11.2 g/dL — ABNORMAL LOW (ref 12.0–15.0)
MCH: 31.5 pg (ref 26.0–34.0)
MCHC: 32.3 g/dL (ref 30.0–36.0)
MCV: 97.7 fL (ref 78.0–100.0)
PLATELETS: 154 10*3/uL (ref 150–400)
RBC: 3.55 MIL/uL — ABNORMAL LOW (ref 3.87–5.11)
RDW: 20.3 % — AB (ref 11.5–15.5)
WBC: 12.2 10*3/uL — ABNORMAL HIGH (ref 4.0–10.5)

## 2015-07-19 LAB — RENAL FUNCTION PANEL
Albumin: 3.3 g/dL — ABNORMAL LOW (ref 3.5–5.0)
Anion gap: 13 (ref 5–15)
BUN: 31 mg/dL — AB (ref 6–20)
CHLORIDE: 93 mmol/L — AB (ref 101–111)
CO2: 23 mmol/L (ref 22–32)
Calcium: 9.5 mg/dL (ref 8.9–10.3)
Creatinine, Ser: 5.01 mg/dL — ABNORMAL HIGH (ref 0.44–1.00)
GFR calc Af Amer: 10 mL/min — ABNORMAL LOW (ref >60)
GFR calc non Af Amer: 9 mL/min — ABNORMAL LOW (ref >60)
GLUCOSE: 289 mg/dL — AB (ref 65–99)
POTASSIUM: 3.8 mmol/L (ref 3.5–5.1)
Phosphorus: 5.1 mg/dL — ABNORMAL HIGH (ref 2.5–4.6)
Sodium: 129 mmol/L — ABNORMAL LOW (ref 135–145)

## 2015-07-19 LAB — GLUCOSE, CAPILLARY
GLUCOSE-CAPILLARY: 133 mg/dL — AB (ref 65–99)
GLUCOSE-CAPILLARY: 196 mg/dL — AB (ref 65–99)
GLUCOSE-CAPILLARY: 262 mg/dL — AB (ref 65–99)
Glucose-Capillary: 247 mg/dL — ABNORMAL HIGH (ref 65–99)

## 2015-07-19 LAB — PROTIME-INR
INR: 1.35 (ref 0.00–1.49)
PROTHROMBIN TIME: 16.8 s — AB (ref 11.6–15.2)

## 2015-07-19 MED ORDER — LIDOCAINE HCL (PF) 1 % IJ SOLN: 5.0000 mL | INTRAMUSCULAR | Status: DC | PRN

## 2015-07-19 MED ORDER — ALTEPLASE 2 MG IJ SOLR: 2.0000 mg | Freq: Once | INTRAMUSCULAR | Status: DC | PRN

## 2015-07-19 MED ORDER — MUPIROCIN 2 % EX OINT: 1.0000 "application " | TOPICAL_OINTMENT | Freq: Two times a day (BID) | CUTANEOUS | Status: DC

## 2015-07-19 MED ORDER — CHLORHEXIDINE GLUCONATE CLOTH 2 % EX PADS: 6.0000 | MEDICATED_PAD | Freq: Every day | CUTANEOUS | Status: DC

## 2015-07-19 MED ORDER — HEPARIN SODIUM (PORCINE) 1000 UNIT/ML DIALYSIS: 1000.0000 [IU] | INTRAMUSCULAR | Status: DC | PRN

## 2015-07-19 MED ORDER — SODIUM CHLORIDE 0.9 % IV SOLN: 100.0000 mL | INTRAVENOUS | Status: DC | PRN

## 2015-07-19 MED ORDER — PENTAFLUOROPROP-TETRAFLUOROETH EX AERO: 1.0000 "application " | INHALATION_SPRAY | CUTANEOUS | Status: DC | PRN

## 2015-07-19 MED ORDER — HEPARIN SODIUM (PORCINE) 1000 UNIT/ML DIALYSIS: 5000.0000 [IU] | INTRAMUSCULAR | Status: DC | PRN

## 2015-07-19 MED ORDER — LIDOCAINE-PRILOCAINE 2.5-2.5 % EX CREA: 1.0000 "application " | TOPICAL_CREAM | CUTANEOUS | Status: DC | PRN

## 2015-07-19 MED ORDER — ALBUMIN HUMAN 25 % IV SOLN
INTRAVENOUS | Status: AC
  Administered 2015-07-19: 12.5 g
  Filled 2015-07-19: qty 50

## 2015-07-19 NOTE — Progress Notes (Signed)
Nutrition Brief Note  Patient identified due to length of stay >/=10 days.  Wt Readings from Last 15 Encounters:  07/19/15 259 lb 7.7 oz (117.7 kg)  07/07/15 323 lb (146.512 kg)  06/24/15 323 lb (146.512 kg)  06/21/15 310 lb (140.615 kg)  04/20/15 268 lb 3.2 oz (121.655 kg)  09/18/14 262 lb 8 oz (119.069 kg)  09/01/14 267 lb 8 oz (121.337 kg)  08/25/14 268 lb (121.564 kg)  08/05/14 259 lb 6.4 oz (117.663 kg)  08/03/14 255 lb (115.667 kg)  07/16/14 247 lb (112.038 kg)  07/07/14 248 lb (112.492 kg)  07/04/14 249 lb 4.8 oz (113.082 kg)  12/02/13 265 lb (120.203 kg)  11/28/13 269 lb 6.4 oz (122.199 kg)    Body mass index is 44.52 kg/(m^2). Patient meets criteria for Morbid Obesity based on current BMI.   Current diet order is Renal/Carb Modified, patient is consuming approximately 100% of meals at this time. Pt reports having a good appetite and eating well. Earlier in the week she felt she was not getting enough protein due to limited options and diet restrictions, but she now feels she has figured out her protein options. She hopes to be discharged today. She denies any further questions or concerns at this time. Labs and medications reviewed.   No nutrition interventions warranted at this time. If nutrition issues arise, please consult RD.   Dorothea Ogleeanne Gerhard Rappaport RD, LDN Inpatient Clinical Dietitian Pager: [phone] After Hours Pager: [phone]

## 2015-07-19 NOTE — Progress Notes (Addendum)
Patient discussed with Dr. Gonzella Lexhungel this afternoon re: tentative d/c plan. He anticipates stability for d/c tomorrow.  [facility] however, by this afternoon Admissions Director- Janie notified this CSW that her beds are currently full now. She will have to re-evaluate bed possibilities in the morning.  At present- there are no other bed offers in place for patient due to her [place] (commercial) medicare.  CSW services will follow up with [facility] in the morning with hopes for a vacancy for patient. Will have to seek an LOG bed for patient [facility].  Lorri Frederickonna T. Jaci LazierCrowder, KentuckyLCSW [phone]

## 2015-07-19 NOTE — Progress Notes (Signed)
[facility] PROGRESS NOTE  Kimberly [name]:[id number] DOB: Sep 21, 1955 DOA: 07/08/2015 PCP: [name], MD  Brief narrative 59 year old female with end-stage renal disease on dialysis started about 3 weeks back to her normal dialysis catheter presented to the hospital for an AV fistula placement but developed cardiac arrest and admitted to the ICU. She was transferred to the hospitalist service on 10/31. Patient is receiving scheduled hemodialysis for massive volume overload. Also started on oral anticoagulation for paroxysmal A. fib with high chads vascular score but held due to vaginal bleed.  Assessment/Plan: Cardiopulmonary arrest -pt had 6 minutes of cardio pulmonary arrest during AV fistula creation in the OR on 07/08/2015. She was admitted to the ICU, intubated and extubated on 10/28. Per cardiology it appears to be respiratory arrest by PCA. 2-D echo showed EF of 55 and 60% with grade 2 diastolic dysfunction.  Paroxysmal A. fib Rate controlled, continue metoprolol - CHA2DS2-VASc is 6 , Cardiology recommended anticoagulation.  - Started on oral Coumadin bridging with IV heparin but held due to vaginal bleeding.  Appreciate cardiology follow-up.  Severe pulmonary artery hypertension Multifactorial secondary to obstructive sleep apnea, obesity, COPD and end-stage renal disease. Heart catheterization in 2015 showed pulmonary venous hypertension. PA pressure of 78 mmHg per 2-D echo. Clinically improving with dialysis.  Acute on chronic diastolic CHF Volume management with dialysis. 2-D echo results as above.  ESRD on hemodialysis Recent started on dialysis through tunneled catheter. Unsuccessful attempt at placement of permanent history axis secondary to cardio pulmonary arrest. Not a candidate  for permanent access per vascular surgery.  Vaginal bleed, postmenopausal Pelvic ultrasound showing thickened endometrium (13.2 mm) without any underlying mass or lesions.  Having off-and-on scanty vaginal bleed. Spoke with GYN Dr  Myna Hidalgo who planned  to schedule for outpatient follow-up sometime early next week for endometrial biopsy.-patient is morbidly obese and deconditioned and is concerned about having difficulty with positioning for exam as outpt.  D/w Dr Charlotta Newton again today to see if it can be done inpt. She will schedule  to get the biopsy done today while patient in the hospital. Hold off on anticoagulation until GYN workup. H&H stable.  Physical Deconditioning Seen by PT and recommends skilled nursing facility.    Code Status: full code Family Communication: None at bedside  Disposition Plan: Skilled nursing facility possibly on 11/8   Consultants:  Renal  Cardiology  Vascular surgery  PCCM  Procedures:  Intubation  HD  2-D echo  Antibiotics:  none  HPI/Subjective: Seen and examined . Reports to having small amount of vaginal bleed. No overnight issues.  Objective: Filed Vitals:   07/19/15 1030  BP: 126/62  Pulse: 84  Temp:   Resp: 19    Intake/Output Summary (Last 24 hours) at 07/19/15 1253 Last data filed at 07/19/15 0600  Gross per 24 hour  Intake    480 ml  Output    100 ml  Net    380 ml   Filed Weights   07/18/15 0411 07/19/15 0616 07/19/15 0811  Weight: 117.8 kg (259 lb 11.2 oz) 118.752 kg (261 lb 12.8 oz) 118.7 kg (261 lb 11 oz)    Exam:   General:   not in distress  HEENT: , moist mucosa  Cardiovascular: Normal S1 and S2, no murmurs   Respiratory: Diminished breath sounds due to body habitus  Abdomen: Soft, nondistended, nontender,   Musculoskeletal: warm, no edema    Data Reviewed: Basic Metabolic Panel:  Recent Labs Lab 07/12/15 1410  07/13/15 2224 07/15/15 0220 07/16/15 0845 07/17/15 0707 07/19/15 0810  NA 135  < > 131* 135 134* 132* 129*  K 3.9  < > 4.1 4.2 3.5 3.2* 3.8  CL 99*  < > 94* 100* 96* 96* 93*  CO2 25  < > 26 26 27 26 23   GLUCOSE 125*  < > 171* 195* 176*  113* 289*  BUN 41*  < > 30* 16 15 17  31*  CREATININE 3.73*  < > 3.14* 2.56* 3.06* 3.03* 5.01*  CALCIUM 9.4  < > 9.2 9.2 9.5 9.3 9.5  PHOS 5.3*  --  3.9  --  3.6 4.3 5.1*  < > = values in this interval not displayed. Liver Function Tests:  Recent Labs Lab 07/12/15 1410 07/13/15 2224 07/16/15 0845 07/17/15 0707 07/19/15 0810  ALBUMIN 2.9* 3.0* 3.1* 3.1* 3.3*   No results for input(s): LIPASE, AMYLASE in the last 168 hours. No results for input(s): AMMONIA in the last 168 hours. CBC:  Recent Labs Lab 07/12/15 1409  07/15/15 0220 07/16/15 0308 07/17/15 0707 07/18/15 0100 07/19/15 0245  WBC 9.6  < > 10.0 10.4 11.6* 14.0* 12.2*  NEUTROABS 7.3  --   --   --   --   --   --   HGB 9.9*  < > 9.8* 9.6* 10.7* 11.6* 11.2*  HCT 31.5*  < > 32.3* 30.6* 33.6* 36.3 34.7*  MCV 102.6*  < > 103.5* 103.7* 100.3* 98.9 97.7  PLT 172  < > 133* 126* 142* 162 154  < > = values in this interval not displayed. Cardiac Enzymes: No results for input(s): CKTOTAL, CKMB, CKMBINDEX, TROPONINI in the last 168 hours. BNP (last 3 results)  Recent Labs  04/06/15 1413 04/08/15 0526  BNP 510.5* 127.0*    ProBNP (last 3 results) No results for input(s): PROBNP in the last 8760 hours.  CBG:  Recent Labs Lab 07/18/15 1054 07/18/15 1621 07/18/15 2052 07/19/15 0534 07/19/15 1150  GLUCAP 240* 252* 207* 247* 133*    No results found for this or any previous visit (from the past 240 hour(s)).   Studies: No results found.  Scheduled Meds: . antiseptic oral rinse  7 mL Mouth Rinse QID  . aspirin EC  81 mg Oral Daily  . chlorhexidine gluconate  15 mL Mouth Rinse BID  . darbepoetin (ARANESP) injection - DIALYSIS  100 mcg Intravenous Q Mon-HD  . docusate sodium  100 mg Oral BID  . ferric gluconate (FERRLECIT/NULECIT) IV  125 mg Intravenous Q M,W,F-HD  . insulin aspart  0-15 Units Subcutaneous TID WC  . insulin glargine  40 Units Subcutaneous BID  . levothyroxine  75 mcg Oral QAC breakfast  .  metoprolol tartrate  12.5 mg Oral BID  . midodrine  10 mg Oral BID WC  . multivitamin  1 tablet Oral QHS  . pantoprazole  40 mg Oral Daily   Continuous Infusions: . sodium chloride Stopped (07/12/15 1100)      Time spent: 20 minutes    Betheny Suchecki  [facility] Pager [phone]. If 7PM-7AM, please contact night-coverage at [email or web address], password [email or web address] 07/19/2015, 12:53 PM  LOS: 11 days

## 2015-07-19 NOTE — Consult Note (Signed)
GYN NOTE  S: 59yo postmenopausal female who was consulted to our service due to postmenopausal bleeding.  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  The patient states she has not had any bleeding since going through menopause over 10 yrs ago.  The bleeding started during her hospital stay when she was started on warfarin.  She reports bright red bleeding- she wasn't sure how much.  It sounds as though the bleeding has discontinued since medication was adjusted and is currently on a baby aspirin only.  She denies new onset of abdominal pain or bloating- just her "baseline" which has been going on for years.  Denies vaginal discharge, itching or odor.  Prior to this episode, reports no gynecologic complaints.   PAST MEDICAL HISTORY :   has a past medical history of HYPERLIPIDEMIA; HYPERTENSION; CAD, NATIVE VESSEL; PVD; COPD; Edema; CHF (congestive heart failure) (HCC); Cellulitis (10/15/2013); Diabetic neuropathy (HCC); Anasarca (05/2014); Type II diabetes mellitus (HCC); Proliferative retinopathy; Peripheral neuropathy (HCC); Complication of anesthesia; Asthma; On home oxygen therapy; Pneumonia ("several times"); Chronic bronchitis (HCC); Hypothyroidism; History of hiatal hernia; GERD (gastroesophageal reflux disease); Stroke [facility]); Arthritis; Anxiety; Depression; Paralyzed vocal cords; Heart attack (HCC) (2011); Shortness of breath dyspnea; Sleep apnea; Anemia; Enlarged liver; Constipation; and Chronic kidney disease.  has past surgical history that includes Carotid endarterectomy (Left, X 2); Vitrectomy (Bilateral); Cataract extraction w/ intraocular lens implant, bilateral (Bilateral); Eye surgery; Colonoscopy; and Insertion of dialysis catheter (N/A, 06/24/2015). Prior to Admission medications   Medication Sig Start Date End Date Taking? Authorizing Provider  aspirin EC 81 MG tablet Take 81  mg by mouth daily.   Yes Historical Provider, MD  insulin aspart (NOVOLOG) 100 UNIT/ML injection Inject 6 Units into the skin 3 (three) times daily with meals. 07/05/14  Yes Zannie Cove, MD  insulin glargine (LANTUS) 100 UNIT/ML injection Inject 50 Units into the skin 2 (two) times daily.    Yes Historical Provider, MD  levothyroxine (SYNTHROID, LEVOTHROID) 75 MCG tablet Take 75 mcg by mouth daily before breakfast.   Yes Historical Provider, MD  metoprolol succinate (TOPROL-XL) 50 MG 24 hr tablet Take 1 tablet (50 mg total) by mouth daily. Take 1 and 1/2 tabs daily = 75 mg daily Patient taking differently: Take 50 mg by mouth daily.  04/20/15  Yes Catarina Hartshorn, MD  Multiple Vitamins-Minerals (MULTIVITAMIN PO) Take 1 tablet by mouth daily.   Yes Historical Provider, MD  oxyCODONE-acetaminophen (PERCOCET/ROXICET) 5-325 MG tablet Take 1 tablet by mouth every 6 (six) hours as needed. Patient taking differently: Take 1 tablet by mouth every 6 (six) hours as needed for moderate pain or severe pain.  06/24/15  Yes Lars Mage, PA-C  acetaminophen (TYLENOL) 325 MG tablet Take 650 mg by mouth every 6 (six) hours as needed for mild pain.    Historical Provider, MD  albuterol (PROVENTIL HFA;VENTOLIN HFA) 108 (90 BASE) MCG/ACT inhaler Inhale 1-2 puffs into the lungs every 6 (six) hours as needed for wheezing or shortness of breath.    Historical Provider, MD  potassium chloride SA (KLOR-CON M20) 20 MEQ tablet Take 2 tablets (40 mEq total) by mouth 2 (two) times daily. Patient not taking: Reported on 07/07/2015 09/18/14   Lewayne Bunting, MD   Allergies  Allergen Reactions  . Ciprofloxacin Itching  . Epinephrine     Increased heart rate    FAMILY HISTORY:  indicated that her  mother is deceased. She indicated that her father is deceased.  SOCIAL HISTORY:  reports that she quit smoking about 9 years ago. Her smoking use included  Cigarettes. She has a 20 pack-year smoking history. She has never used smokeless tobacco. She reports that she does not drink alcohol or use illicit drugs.       O: BP 126/62 mmHg  Pulse 84  Temp(Src) 97.5 F (36.4 C) (Oral)  Resp 19  Ht 5\' 4"  (1.626 m)  Wt 118.7 kg (261 lb 11 oz)  BMI 44.90 kg/m2  SpO2 98%  Gen: NAD Psych: mood appropriate Abd: morbidly obese, soft and non-tender.  Unable to palpate for masses due to obesity GU: normal external genital, vaginal mucosa pink, no abnormal discharge, cervix seen- no visible lesions or bleeding Ext: +LE edema with chronic stasis changes  TVUS on 07/14/15:  IMPRESSION: 1. Exam limited due to body habitus. 2. Uterus normal in size. No uterine mass. 3. Endometrium measures 13.2 mm in thickness. No endometrial mass seen. In the setting of post-menopausal bleeding, endometrial sampling is indicated to exclude carcinoma. If results are benign, sonohysterogram should be considered for focal lesion work-up. (Ref: Radiological Reasoning: Algorithmic Workup of Abnormal Vaginal Bleeding with Endovaginal Sonography and Sonohysterography. AJR 2008; 119:[id number]:S68-73) 4. Neither ovary visualized. No adnexal masses.  A/P: 59yo postmenopausal female consulted due to vaginal bleeding  -Suspect bleeding due to anticoagulation.  No active bleeding on exam.   Reviewed US finding with patient and in light of thickened lining and bleeding, plan for EMB.  Risk and benefits reviewed with patient and she wishes to proceed with procedure.  Please see procedure note regarding EMB.  Further management pending results of biopsy.   -No further in-hospital management or work up at this time -Medical co-morbidities- management per IM and associated services  Myna HidalgoJennifer Ignatz Deis, DO [phone] (pager) [phone] (office)

## 2015-07-19 NOTE — Procedures (Signed)
Endometrial biopsy Procedure note: Risk, benefit and indications reviewed with patient.  Inform consent obtained prior to the procedure  Physician: Dr. Myna HidalgoJennifer Tavon Corriher  Procedure:  A sterile speculum was placed.  The anterior lip of the cervix was grasped with a tenaculum.  A 4mm pipet was advanced with some difficulty due to patient positioning.  Tissue sample was obtained.  All instruments were removed, adequate hemostasis was noted.  Pt tolerated procedure without complications.  Myna HidalgoJennifer Neilson Oehlert, DO [phone] (pager) [phone] (office)

## 2015-07-19 NOTE — Progress Notes (Signed)
Physical Therapy Treatment Patient Details Name: Kimberly [name] MRN: [id number] DOB: 25-Jun-1956 Today's Date: 07/19/2015    History of Present Illness [age] yof with a PMH of HTN, HLD, CAD, COPD, CHF, DM, OSA, and ESRD secondary to DM (dialyzes MWF) [facility] right arm AV fistula creation. Pt was undergoing procedure when she suffered from a cardiac arrest 10/27.    PT Comments    Pt admitted with above diagnosis. Pt currently with functional limitations due to balance and endurance deficits. Pt ambulated with RW with fair stability overall.  Pt did not lose balance with RW and was able to ambulate a short distance.  Some cues for safety with turns and pt did not always stay close to RW.  Will benefit from some SNF prior to d/c home.   Pt will benefit from skilled PT to increase their independence and safety with mobility to allow discharge to the venue listed below.    Follow Up Recommendations  SNF;Supervision/Assistance - 24 hour     Equipment Recommendations  Other (comment) (TBA)    Recommendations for Other Services       Precautions / Restrictions Precautions Precautions: Fall Precaution Comments: Painful L arm; history of chronic back pain Restrictions Weight Bearing Restrictions: No    Mobility  Bed Mobility Overal bed mobility: Needs Assistance Bed Mobility: Supine to Sit Rolling: Min assist Sidelying to sit: Min assist (using rail)     Sit to sidelying: Max assist;+2 for physical assistance (for LEs and using rail) General bed mobility comments: Cues for log roll technique and use of rail as needed. min assist for truncal support to rise to seated position and max assist for LE support back into bed.  Transfers Overall transfer level: Needs assistance Equipment used: Rolling walker (2 wheeled) Transfers: Sit to/from Stand Sit to Stand: Min guard;+2 safety/equipment;From elevated surface         General transfer comment: Min assist with +2 for safety  from elevated bed surface. VC for hand placement and technique. Performed x2.  Ambulation/Gait Ambulation/Gait assistance: Min guard Ambulation Distance (Feet): 16 Feet (8 feet x 2) Assistive device: Rolling walker (2 wheeled) Gait Pattern/deviations: Step-through pattern;Decreased stride length;Trunk flexed;Wide base of support Gait velocity: slow Gait velocity interpretation: Below normal speed for age/gender General Gait Details: Min assist for walker control. VC for placement for proximity. Very fatigued with short distance but no buckling noted. Able to perform 2 bouts with a rest break inbetween. +2 assist for equipment and safety.   Stairs            Wheelchair Mobility    Modified Rankin (Stroke Patients Only)       Balance Overall balance assessment: Needs assistance;History of Falls Sitting-balance support: Bilateral upper extremity supported;Feet supported Sitting balance-Leahy Scale: Fair Sitting balance - Comments: Not requiring bil UE suppport for sitting EOB.   Standing balance support: Bilateral upper extremity supported;During functional activity Standing balance-Leahy Scale: Poor Standing balance comment: requires UE support for balance in standing with use of RW.Cannot withstand challenges to balance.                     Cognition Arousal/Alertness: Awake/alert Behavior During Therapy: WFL for tasks assessed/performed Overall Cognitive Status: Within Functional Limits for tasks assessed                      Exercises      General Comments General comments (skin integrity, edema, etc.): Pt  ambulated to sink and performed full bath - see OT note for full details.  Pt also brushed teeth.  Stood at sink to wash face and perineal area with pt able to stand and balance with RW with min guard assist.  Rest of bath and brushing teeth with pt sitting in chair.        Pertinent Vitals/Pain Pain Assessment: Faces Faces Pain Scale: Hurts  little more Pain Location: back  Pain Descriptors / Indicators: Grimacing Pain Intervention(s): Limited activity within patient's tolerance;Monitored during session;Repositioned  VSS with sats >90% on 2LO2.     Home Living                      Prior Function            PT Goals (current goals can now be found in the care plan section) Progress towards PT goals: Progressing toward goals    Frequency  Min 2X/week    PT Plan Current plan remains appropriate    Co-evaluation PT/OT/SLP Co-Evaluation/Treatment: Yes Reason for Co-Treatment: For patient/therapist safety PT goals addressed during session: Mobility/safety with mobility       End of Session Equipment Utilized During Treatment: Gait belt;Oxygen Activity Tolerance: Patient limited by fatigue Patient left: in bed;with call bell/phone within reach;with nursing/sitter in room (MD gettting ready to perform pelvic exam)     Time: 1610-9604 PT Time Calculation (min) (ACUTE ONLY): 35 min  Charges:  $Gait Training: 8-22 mins                    G Codes:      WhiteAmadeo Garnet Aug 02, 2015, 1:01 PM [employer] [facility] [phone] (pager)

## 2015-07-19 NOTE — Progress Notes (Signed)
Primary cardiologist Dr. Jens Somrenshaw  Subjective: Only chest discomfort for compressions.   Objective: Vital signs in last 24 hours: Temp:  [97.5 F (36.4 C)-98.2 F (36.8 C)] 97.5 F (36.4 C) (11/07 0811) Pulse Rate:  [80-100] 85 (11/07 1000) Resp:  [13-18] 15 (11/07 1000) BP: (75-147)/(36-70) 110/60 mmHg (11/07 1000) SpO2:  [97 %-98 %] 98 % (11/07 0616) Weight:  [261 lb 11 oz (118.7 kg)-261 lb 12.8 oz (118.752 kg)] 261 lb 11 oz (118.7 kg) (11/07 0811) Weight change: 2 lb 1.6 oz (0.952 kg) Last BM Date: 07/18/15 Intake/Output from previous day: since admit -16,524  11/06 0701 - 11/07 0700 In: 720 [P.O.:720] Out: 201 [Urine:200; Stool:1] Intake/Output this shift:    PE: General:Pleasant affect, NAD Skin:Warm and dry, brisk capillary refill HEENT:normocephalic, sclera clear, mucus membranes moist Heart:S1S2 RRR without murmur, gallup, rub or click Lungs:clear, ant without rales, rhonchi, or wheezes ZOX:WRUEA,VWUJAbd:obese,soft, non tender, + BS, do not palpate liver spleen or masses Ext:no lower ext edema-- much improved,  2+ radial pulses Neuro:alert and oriented X 3 MAE, follows commands, + facial symmetry Tele;  SR    Lab Results:  Recent Labs  07/18/15 0100 07/19/15 0245  WBC 14.0* 12.2*  HGB 11.6* 11.2*  HCT 36.3 34.7*  PLT 162 154   BMET  Recent Labs  07/17/15 0707 07/19/15 0810  NA 132* 129*  K 3.2* 3.8  CL 96* 93*  CO2 26 23  GLUCOSE 113* 289*  BUN 17 31*  CREATININE 3.03* 5.01*  CALCIUM 9.3 9.5   No results for input(s): TROPONINI in the last 72 hours.  Invalid input(s): CK, MB  No results found for: CHOL, HDL, LDLCALC, LDLDIRECT, TRIG, CHOLHDL Lab Results  Component Value Date   HGBA1C 8.7* 04/15/2015     Lab Results  Component Value Date   TSH 9.120* 06/24/2014    Hepatic Function Panel  Recent Labs  07/19/15 0810  ALBUMIN 3.3*   No results for input(s): CHOL in the last 72 hours. No results for input(s): PROTIME in the last 72  hours.     Studies/Results: ECHO: Study Conclusions  - Left ventricle: The cavity size was normal. There was mild concentric hypertrophy. Systolic function was normal. The estimated ejection fraction was in the range of 55% to 60%. Wall motion was normal; there were no regional wall motion abnormalities. Features are consistent with a pseudonormal left ventricular filling pattern, with concomitant abnormal relaxation and increased filling pressure (grade 2 diastolic dysfunction). Doppler parameters are consistent with high ventricular filling pressure. - Aortic valve: Poorly visualized. - Mitral valve: Calcified annulus. Mild diffuse thickening and calcification of the anterior leaflet. The findings are consistent with mild stenosis. Valve area by pressure half-time: 2.12 cm^2. Valve area by continuity equation (using LVOT flow): 0.69 cm^2. - Left atrium: Poorly visualized. - Right ventricle: Poorly visualized. - Pulmonic valve: Poorly visualized. - Pulmonary arteries: Poorly visualized. PA peak pressure: 78 mm Hg (S).  Impressions:  - The right ventricular systolic pressure was increased consistent with severe pulmonary hypertension.    Medications: I have reviewed the patient's current medications. Scheduled Meds: . antiseptic oral rinse  7 mL Mouth Rinse QID  . aspirin EC  81 mg Oral Daily  . chlorhexidine gluconate  15 mL Mouth Rinse BID  . darbepoetin (ARANESP) injection - DIALYSIS  100 mcg Intravenous Q Mon-HD  . docusate sodium  100 mg Oral BID  . ferric gluconate (FERRLECIT/NULECIT) IV  125 mg Intravenous Q M,W,F-HD  .  insulin aspart  0-15 Units Subcutaneous TID WC  . insulin glargine  40 Units Subcutaneous BID  . levothyroxine  75 mcg Oral QAC breakfast  . metoprolol tartrate  12.5 mg Oral BID  . midodrine  10 mg Oral BID WC  . multivitamin  1 tablet Oral QHS  . pantoprazole  40 mg Oral Daily   Continuous Infusions: . sodium  chloride Stopped (07/12/15 1100)   PRN Meds:.sodium chloride, sodium chloride, acetaminophen **OR** acetaminophen, albuterol, alteplase, fentaNYL (SUBLIMAZE) injection, heparin, [START ON 07/20/2015] heparin, hydrALAZINE, lidocaine (PF), lidocaine-prilocaine, metoprolol, pentafluoroprop-tetrafluoroeth, phenol  Assessment/Plan: Active Problems:   CAD (coronary artery disease), native coronary artery   Chronic diastolic CHF (congestive heart failure) (HCC)   Mitral valve stenosis, moderate   Pulmonary HTN (HCC)   Morbid obesity (HCC)   ESRD (end stage renal disease) (HCC)   Paroxysmal atrial fibrillation (HCC)   Altered mental status   Pulmonary arterial hypertension (HCC)   Acute on chronic diastolic congestive heart failure (HCC)   Mitral stenosis   Vaginal bleeding  1. Cardiopulmonary arrest - respiratory arrest clearly preceded cardiac arrest (PEA)prior to permanent dialysis access.  She received very little sedation before her local anesthetic was administered (1 mg Versed + Propofol 25 mcg/kg/min for a few minutes), but has had problems with exaggerated response to sedation before. No plan for EP testing   2. Paroxysmal atrial fibrillation - she has frequently had sustained irregular palpitations at home before the current diagnostic arrhythmia. High risk for embolic stroke. Has had a previous stroke. All major stroke risk factors are present except age, CHADS Vasc 6. Anticoagulation on hold due to vaginal bleeding. Plan to restart coumadin for atrial fibrillation after her gynecological workup is completed.. Maintaining sinus rhythm.   3. Diastolic HF, acute on chronic - now on daily HD. 16L net fluid negative since admission. Weight 261, down 60lbs since 10/27. Previous estimated dry weight was 250 lb.  4. Severe pulmonary artery HTN - at cath 2015 was mostly due to pulmonary venous HTN (diastolic LV dysfunction +/- mild mitral stenosis), but there was also a component of intrinsic  pulmonary arteriolar disease (obesity, OSA, COPD).  5. ESRD. Extra sessions have been done now back to M/W/Friday   6. Vaginal bleeding Began while on IV heparin and warfarin initiation, now stopped. Needs w/u before anticoagulation restarted - pt stated this was to be evaluated today.  7.  Deconditioned plan for discharge to SNF for rehab.  Will arrange outpt follow up in next several weeks.    LOS: 11 days   Time spent with pt. :15 minutes. [facility]  Nurse Practitioner Certified Pager [phone] or after 5pm and on weekends call [phone] 07/19/2015, 10:37 AM   As above; patient seen and examined; CP from compressions; no dyspnea; patient in sinus on telemetry. Will continue low dose metoprolol; if recurrent atrial fibrillation would need to consider amiodarone as BP borderline. Will resume coumadin when vaginal bleeding fully evaluated and corrected. Management of weight per nephrology/dialysis. Kimberly [name]

## 2015-07-19 NOTE — Progress Notes (Signed)
Inpatient Diabetes Program Recommendations  AACE/ADA: New Consensus Statement on Inpatient Glycemic Control (2015)  Target Ranges:  Prepandial:   less than 140 mg/dL      Peak postprandial:   less than 180 mg/dL (1-2 hours)      Critically ill patients:  140 - 180 mg/dL  Results for [name], Shereece K (MRN [id number]) as of 07/19/2015 11:03  Ref. Range 07/18/2015 06:09 07/18/2015 10:54 07/18/2015 16:21 07/18/2015 20:52 07/19/2015 05:34  Glucose-Capillary Latest Ref Range: 65-99 mg/dL 989160 (H) 211240 (H) 941252 (H) 207 (H) 247 (H)   Review of Glycemic Control  Diabetes history: DM2 Outpatient Diabetes medications: Lantus 50 units BID, Novolog 6 units TID with meals Current orders for Inpatient glycemic control: Lantus 40 units BID, Novoog 0-15 units TID with meals  Inpatient Diabetes Program Recommendations: Insulin - Basal: Please consider increasing Lantus to 42 units BID. Correction (SSI): Please consider ordering Novolog bedtime correction scale. Insulin - Meal Coverage: Please consider ordering Novolog 4 units TID with meals for meal coverage if patient eats at least 50% of meals (in addition to Novolog correction).  Thanks, Orlando PennerMarie Caitlen Worth, RN, MSN, CDE Diabetes Coordinator Inpatient Diabetes Program [phone] (Team Pager from 8am to 5pm) [phone] ([facility] office) [phone] [facility] office) [phone] [facility] office)

## 2015-07-19 NOTE — Progress Notes (Signed)
Occupational Therapy Treatment Patient Details Name: Kimberly [name] MRN: [id number] DOB: [birth year] Today's Date: 07/19/2015    History of present illness [age] yof with a PMH of HTN, HLD, CAD, COPD, CHF, DM, OSA, and ESRD secondary to DM (dialyzes MWF) [facility] fistula creation. Pt was undergoing procedure when she suffered from a cardiac arrest 10/27.   OT comments  Patient making slow, but steady progress towards goals. Pt with poor endurance and required increased encouragement to fully participate in therapy session. Pt continues to require min (+2 for safety) assistance with mobility. Pt unsafe to go home and be alone at this time, therefore continue to recommend SNF.    Follow Up Recommendations  SNF    Equipment Recommendations  None recommended by OT    Recommendations for Other Services  None at this time   Precautions / Restrictions Precautions Precautions: Fall Precaution Comments: Painful L arm; history of chronic back pain Restrictions Weight Bearing Restrictions: No     Mobility Bed Mobility Overal bed mobility: Needs Assistance Bed Mobility: Supine to Sit Rolling: Min assist Sidelying to sit: Min assist (using rail)     Sit to sidelying: Max assist;+2 for physical assistance (for LEs and using rail) General bed mobility comments: Cues for log roll technique and use of rail as needed. min assist for truncal support to rise to seated position and max assist for LE support back into bed. Pt insisted on using bed rails, she states she has rails at home.  Transfers Overall transfer level: Needs assistance Equipment used: Rolling walker (2 wheeled) Transfers: Sit to/from Stand Sit to Stand: Min guard;+2 safety/equipment;From elevated surface General transfer comment: Min assist with +2 for safety from elevated bed surface. VC for hand placement and technique. Performed x2.    Balance Overall balance assessment: Needs assistance;History of  Falls Sitting-balance support: Bilateral upper extremity supported;Feet supported Sitting balance-Leahy Scale: Fair Sitting balance - Comments: Not requiring bil UE suppport for sitting EOB.   Standing balance support: Bilateral upper extremity supported;During functional activity Standing balance-Leahy Scale: Poor Standing balance comment: requires UE support for balance in standing with use of RW. Cannot withstand challenges to balance, increased anxiety when trying to do so.     ADL Overall ADL's : Needs assistance/impaired     Grooming: Wash/dry hands;Wash/dry face;Oral care;Brushing hair;Set up;Sitting;Standing Grooming Details (indicate cue type and reason): at sink, cues and encouragement to stand prn and as much as possible Upper Body Bathing: Minimal assitance;Sitting Upper Body Bathing Details (indicate cue type and reason): at sink Lower Body Bathing: Total assistance;Sit to/from stand Lower Body Bathing Details (indicate cue type and reason): at sink, min assist (+2 for safety) for sit to/from stand   Upper Body Dressing Details (indicate cue type and reason): total assist for hopsital gown Lower Body Dressing: Total assistance;Sit to/from stand Lower Body Dressing Details (indicate cue type and reason): at sink, min assist (+2 for safety) for sit to/from stand General ADL Comments: Pt required mod encouragement to participate. Pt states her husband bathes and dresses her from head to toe. Educated pt on importance of her doing as much as she could to increase her overall independence and endurance.      Cognition   Behavior During Therapy: WFL for tasks assessed/performed (somewhat anxious) Overall Cognitive Status: Within Functional Limits for tasks assessed                 Pertinent Vitals/ Pain  Pain Assessment: Faces Faces Pain Scale: Hurts little more Pain Location: back Pain Descriptors / Indicators: Grimacing Pain Intervention(s): Limited activity  within patient's tolerance;Monitored during session;Repositioned   Frequency Min 2X/week     Progress Toward Goals  OT Goals(current goals can now befound in the care plan section)  Progress towards OT goals: Progressing toward goals     Plan Discharge plan remains appropriate    Co-evaluation    PT/OT/SLP Co-Evaluation/Treatment: Yes Reason for Co-Treatment: For patient/therapist safety PT goals addressed during session: Mobility/safety with mobility OT goals addressed during session: ADL's and self-care;Strengthening/ROM     End of Session Equipment Utilized During Treatment: Oxygen   Activity Tolerance Patient limited by fatigue;Other (comment) (and anxiety)   Patient Left in bed;with call bell/phone within reach;with nursing/sitter in room   Nurse Communication Mobility status     Time: 1610-9604 OT Time Calculation (min): 33 min  Charges: OT General Charges $OT Visit: 1 Procedure OT Treatments $Self Care/Home Management : 8-22 mins  Delando Satter , MS, OTR/L, CLT  07/19/2015, 1:28 PM

## 2015-07-19 NOTE — Progress Notes (Signed)
Pt a/o, no c/o pain, pt had HD today, pt worked with PT, pt VSS, pt stable

## 2015-07-19 NOTE — Procedures (Signed)
Patient seen on Hemodialysis. QB 300, UF goal 2L Treatment adjusted as needed.  Zetta BillsJay Haileyann Staiger MD [facility]. Office # [phone] Pager # [phone] 9:14 AM

## 2015-07-19 NOTE — Progress Notes (Signed)
Patient ID: Galen ManilaSusan K Dieterich, female   DOB: August 20, 1956, 59 y.o.   MRN: [id number]  [facility] Progress Note   Assessment/ Plan:   1. PEA arrest on 10/27 with volume overload: improving well with UF on HD 2. ESRD: on HD today with plans to continue MWF HD. New EDW will be much lower- 118kg and may need to be lowered further as an OP 3. 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 Nutrition:albumin slightly low, discussed lean protein intake/supplements 6. Hypertension:BP dropping today at HD--appears close to EDW with intravascular contraction and delay with interstitial equilibration  Subjective:   Reports to be feeling tired at HD- anticipated DC to SNF later today   Objective:   BP 135/70 mmHg  Pulse 91  Temp(Src) 97.5 F (36.4 C) (Oral)  Resp 13  Ht 5\' 4"  (1.626 m)  Wt 118.7 kg (261 lb 11 oz)  BMI 44.90 kg/m2  SpO2 98%  Physical Exam: WNI:OEVOJJKKXFGGen:Comfortable resting in dialysis HWE:XHBZJCVS:Pulse RRR, S1 and S2 with ESM Resp:CTA bilaterally (poor inspiratory effort), no rales/rhonchi IRC:VELFAbd:Soft, obese, NT, BS normal YBO:FBPZWExt:Trace LE edema with chronic stasis skin changes  Labs: BMET  Recent Labs Lab 07/12/15 1410 07/13/15 0510 07/13/15 2224 07/15/15 0220 07/16/15 0845 07/17/15 0707 07/19/15 0810  NA 135 131* 131* 135 134* 132* 129*  K 3.9 4.0 4.1 4.2 3.5 3.2* 3.8  CL 99* 97* 94* 100* 96* 96* 93*  CO2 25 25 26 26 27 26 23   GLUCOSE 125* 140* 171* 195* 176* 113* 289*  BUN 41* 25* 30* 16 15 17  31*  CREATININE 3.73* 2.86* 3.14* 2.56* 3.06* 3.03* 5.01*  CALCIUM 9.4 8.7* 9.2 9.2 9.5 9.3 9.5  PHOS 5.3*  --  3.9  --  3.6 4.3 5.1*   CBC  Recent Labs Lab 07/12/15 1409  07/16/15 0308 07/17/15 0707 07/18/15 0100 07/19/15 0245  WBC 9.6  < > 10.4 11.6* 14.0* 12.2*  NEUTROABS 7.3  --   --   --   --   --   HGB 9.9*  < > 9.6* 10.7* 11.6* 11.2*  HCT 31.5*  < > 30.6* 33.6* 36.3 34.7*  MCV 102.6*  < > 103.7* 100.3*  98.9 97.7  PLT 172  < > 126* 142* 162 154  < > = values in this interval not displayed.  Medications:    . antiseptic oral rinse  7 mL Mouth Rinse QID  . aspirin EC  81 mg Oral Daily  . chlorhexidine gluconate  15 mL Mouth Rinse BID  . darbepoetin (ARANESP) injection - DIALYSIS  100 mcg Intravenous Q Mon-HD  . docusate sodium  100 mg Oral BID  . ferric gluconate (FERRLECIT/NULECIT) IV  125 mg Intravenous Q M,W,F-HD  . insulin aspart  0-15 Units Subcutaneous TID WC  . insulin glargine  40 Units Subcutaneous BID  . levothyroxine  75 mcg Oral QAC breakfast  . metoprolol tartrate  12.5 mg Oral BID  . midodrine  10 mg Oral BID WC  . multivitamin  1 tablet Oral QHS  . pantoprazole  40 mg Oral Daily   Zetta BillsJay Vallie Teters, MD 07/19/2015, 9:15 AM

## 2015-07-20 DIAGNOSIS — IMO0002 Reserved for concepts with insufficient information to code with codable children: Secondary | ICD-10-CM | POA: Diagnosis present

## 2015-07-20 DIAGNOSIS — R5381 Other malaise: Secondary | ICD-10-CM

## 2015-07-20 DIAGNOSIS — E662 Morbid (severe) obesity with alveolar hypoventilation: Secondary | ICD-10-CM

## 2015-07-20 DIAGNOSIS — E1122 Type 2 diabetes mellitus with diabetic chronic kidney disease: Secondary | ICD-10-CM

## 2015-07-20 DIAGNOSIS — J9601 Acute respiratory failure with hypoxia: Secondary | ICD-10-CM | POA: Diagnosis present

## 2015-07-20 DIAGNOSIS — I9589 Other hypotension: Secondary | ICD-10-CM

## 2015-07-20 DIAGNOSIS — N184 Chronic kidney disease, stage 4 (severe): Secondary | ICD-10-CM

## 2015-07-20 DIAGNOSIS — E1165 Type 2 diabetes mellitus with hyperglycemia: Secondary | ICD-10-CM | POA: Diagnosis present

## 2015-07-20 DIAGNOSIS — I959 Hypotension, unspecified: Secondary | ICD-10-CM | POA: Diagnosis present

## 2015-07-20 LAB — CBC
HEMATOCRIT: 33.7 % — AB (ref 36.0–46.0)
Hemoglobin: 10.9 g/dL — ABNORMAL LOW (ref 12.0–15.0)
MCH: 31.8 pg (ref 26.0–34.0)
MCHC: 32.3 g/dL (ref 30.0–36.0)
MCV: 98.3 fL (ref 78.0–100.0)
PLATELETS: 146 10*3/uL — AB (ref 150–400)
RBC: 3.43 MIL/uL — AB (ref 3.87–5.11)
RDW: 20.7 % — ABNORMAL HIGH (ref 11.5–15.5)
WBC: 10.9 10*3/uL — AB (ref 4.0–10.5)

## 2015-07-20 LAB — GLUCOSE, CAPILLARY
GLUCOSE-CAPILLARY: 191 mg/dL — AB (ref 65–99)
GLUCOSE-CAPILLARY: 237 mg/dL — AB (ref 65–99)
GLUCOSE-CAPILLARY: 259 mg/dL — AB (ref 65–99)

## 2015-07-20 LAB — PROTIME-INR
INR: 1.33 (ref 0.00–1.49)
Prothrombin Time: 16.6 seconds — ABNORMAL HIGH (ref 11.6–15.2)

## 2015-07-20 MED ORDER — METOPROLOL TARTRATE 25 MG PO TABS: 12.5000 mg | ORAL_TABLET | Freq: Two times a day (BID) | ORAL | Status: DC

## 2015-07-20 MED ORDER — HYDROCERIN EX CREA: 1.0000 "application " | TOPICAL_CREAM | Freq: Two times a day (BID) | CUTANEOUS | Status: DC

## 2015-07-20 MED ORDER — MIDODRINE HCL 10 MG PO TABS: 10.0000 mg | ORAL_TABLET | Freq: Two times a day (BID) | ORAL | Status: DC

## 2015-07-20 MED ORDER — HYDROCERIN EX CREA
TOPICAL_CREAM | Freq: Two times a day (BID) | CUTANEOUS | Status: DC
Start: 2015-07-20 — End: 2015-07-20
  Administered 2015-07-20: 10:00:00 via TOPICAL
  Filled 2015-07-20: qty 113

## 2015-07-20 NOTE — Care Management Note (Signed)
Case Management Note  Patient Details  Name: Kimberly ManilaSusan K Degidio MRN: [id number] Date of Birth: 03-06-1956  Subjective/Objective:          Admitted with ESRD          Action/Plan: Patient is refusing to go to SNF and requesting to go home at discharge. [facility] offered[facility]. No DME needed. Patient stated that she lives with her spouse and she has family members that can also provide further assistance as needed. Attending MD at discharge please enter the face to face document in Epic [facility]. Expected Discharge Date:   07/20/2015               Expected Discharge Plan:  Home w Home Health Services  In-House Referral:  Clinical Social Work  Discharge planning Services  CM Consult  Choice offered to:  Patient  HH Arranged:  RN, PT, OT, Nurse's Aide, Social Work [facility] [facility]  Status of Service:  In process, will continue to follow   Reola MosherChandler, Tayla Panozzo L, RN,MHA,BSN [phone] 07/20/2015, 9:45 AM

## 2015-07-20 NOTE — Clinical Social Work Note (Signed)
CSW spoke with Kimberly [name] at [facility] about their bed availability. Kimberly [name] reported that she does not have a bed to offer the Kimberly [name]. CSW met the Kimberly [name] at the bedside to provided her with an update regarding [facility] bed availability. Kimberly [name] strongly expressed that she will only go to [facility] despite the other bed offers. Kimberly [name] reported if she can not go to [facility] she will go home with home health. Kimberly [name] reported that she feels confident that she will be able to manage at home with the help of her husband. CSW provided update to the Case Manager. CSW will sign off.    Clinical Social Worker  Aminata Buffalo, MSW, [name] [phone]

## 2015-07-20 NOTE — Progress Notes (Signed)
Inpatient Diabetes Program Recommendations  AACE/ADA: New Consensus Statement on Inpatient Glycemic Control (2015)  Target Ranges:  Prepandial:   less than 140 mg/dL      Peak postprandial:   less than 180 mg/dL (1-2 hours)      Critically ill patients:  140 - 180 mg/dL   Results for Kimberly [name], [name] (MRN [id number]) as of 07/20/2015 08:57  Ref. Range 07/19/2015 05:34 07/19/2015 11:50 07/19/2015 16:41 07/19/2015 22:11 07/20/2015 06:14  Glucose-Capillary Latest Ref Range: 65-99 mg/dL 409247 (H) 811133 (H) 914196 (H) 262 (H) 237 (H)   Review of Glycemic Control  Diabetes history: DM2 Outpatient Diabetes medications: Lantus 50 units BID, Novolog 6 units TID with meals Current orders for Inpatient glycemic control: Lantus 40 units BID, Novoog 0-15 units TID with meals  Inpatient Diabetes Program Recommendations: Insulin - Basal: Please consider increasing Lantus to 42 units BID. Correction (SSI): Please consider ordering Novolog bedtime correction scale.  Thanks, Orlando PennerMarie Havard Radigan, RN, MSN, CDE Diabetes Coordinator Inpatient Diabetes Program [phone] (Team Pager from 8am to 5pm) [phone] ([facility] office) [phone] [facility] office) [phone] [facility] office)

## 2015-07-20 NOTE — Discharge Summary (Addendum)
Physician Discharge Summary  Kimberly [name] ZOX:[id number] DOB: [birth year] DOA: 07/08/2015  PCP: [name], MD  Admit date: 07/08/2015 Discharge date: 07/20/2015  Time spent: 35 minutes  Recommendations for Outpatient Follow-up:  1. Discharge home with outpatient follow-up with routine dialysis and cardiology. 2. Please follow up on endometrial biopsy result has outpatient and decide on anticoagulation for her afib.  Discharge Diagnoses:  Principal Problem:   Acute respiratory failure with hypoxemia (HCC) with cardiopulmonary arrest   Active Problems:   CAD (coronary artery disease), native coronary artery   Chronic diastolic CHF (congestive heart failure) (HCC)   Mitral valve stenosis, moderate   Morbid obesity (HCC)   Physical deconditioning   Obesity hypoventilation syndrome (HCC)   ESRD (end stage renal disease) (HCC)   Paroxysmal atrial fibrillation (HCC)   Altered mental status   Pulmonary arterial hypertension (HCC)   Acute on chronic diastolic congestive heart failure (HCC)   Vaginal bleeding   Hypotension   Uncontrolled type 2 diabetes mellitus (HCC)   COPD   Discharge Condition: Fair  Diet recommendation: renal/ carb modified  Filed Weights   07/19/15 0811 07/19/15 1045 07/20/15 0455  Weight: 118.7 kg (261 lb 11 oz) 117.7 kg (259 lb 7.7 oz) 117.908 kg (259 lb 15 oz)    History of present illness:  Please refer to admission H&P for details, in brief, 59 year old morbidly obese female with history of coronary artery disease, chronic diastolic CHF, severe pulmonary hypertension, OSA, COPD on with end-stage renal disease on dialysis started about 3 weeks back to her normal dialysis catheter presented to the hospital for an AV fistula placement but developed cardiac arrest and admitted to the ICU. She was transferred to the hospitalist service on 10/31. Patient is receiving scheduled hemodialysis for massive volume overload. Also started on oral  anticoagulation for paroxysmal A. fib with high chads vascular score but held due to vaginal bleed.  Hospital Course:  Cardiopulmonary arrest -pt had 6 minutes of cardio pulmonary arrest during AV fistula creation in the OR on 07/08/2015. She was admitted to the ICU, intubated and extubated on 10/28. Per cardiology it appears to be respiratory arrest by PCA. 2-D echo showed EF of 55 and 60% with grade 2 diastolic dysfunction. -Patient remained stable and maintaining O2 sat on 2 L pain is now.  Paroxysmal A. fib Rate controlled, continue metoprolol - CHA2DS2-VASc is 6 , Cardiology recommended anticoagulation.  - Started on oral Coumadin bridging with IV heparin but held due to vaginal bleeding. Endometrial biopsy done on 11/7. Cardiology will follow-up on the results and decide upon anticoagulation during outpatient visit. Appreciate cardiology follow-up.  Severe pulmonary artery hypertension Multifactorial secondary to obstructive sleep apnea, obesity, COPD and end-stage renal disease. Heart catheterization in 2015 showed pulmonary venous hypertension. PA pressure of 78 mmHg per 2-D echo. Clinically improving with dialysis.  Acute on chronic diastolic CHF Volume management with dialysis. 2-D echo results as above.  ESRD on hemodialysis Recent started on dialysis through tunneled catheter. Unsuccessful attempt at placement of permanent history axis secondary to cardio pulmonary arrest. Not a candidate for permanent access per vascular surgery.continue MV. follow with scheduled HD.  Vaginal bleed, postmenopausal Pelvic ultrasound showing thickened endometrium (13.2 mm) without any underlying mass or lesions. Having off-and-on scanty vaginal bleed.  Appreciate GYN evaluation. Dr Charlotta Newton performed endometrial biopsy at bedside.  Follow biopsy result during outpatient visit and decide on anticoagulation. H&H stable.  COPD with chronic respiratory failure On 2 L oxygen at home. Currently  stable. Continue home inhalers.  Diabetes mellitus type 2, uncontrolled On Lantus and pre-meal aspart at home. FSG in 200s. Will resume home dose upon discharge.  Hypotension  Metoprolol dose reduced and added midodrine twice a day to facilitate HD.   Physical Deconditioning Seen by PT and recommends skilled nursing facility. Bed not available at patient's SNF of choice.s he wishes to go home with services. Will arrange and is stable for discharge.   Code Status: full code Family Communication: None at bedside  Disposition Plan: home with [facility]   Consultants:  Renal  Cardiology  Vascular surgery  PCCM  GYN ( Dr Charlotta Newton)  Procedures:  Intubation  HD  2-D echo  Endometrial biopsy  Antibiotics:  none Discharge Exam: Filed Vitals:   07/20/15 0455  BP: 119/58  Pulse: 82  Temp: 98.1 F (36.7 C)  Resp: 21     General: Morbidly obese female not in distress  HEENT: Pallor present, moist mucosa, no JVD  Cardiovascular: Normal S1 and S2, no murmurs   Respiratory: Diminished breath sounds due to body habitus  Abdomen: Soft, nondistended, nontender,   Musculoskeletal: warm, no edema, chronic skin changes in b/l feet  CNS: Alert and oriented  Discharge Instructions    Current Discharge Medication List    START taking these medications   Details  hydrocerin (EUCERIN) CREA Apply 1 application topically 2 (two) times daily. Qty: 113 g, Refills: 0    metoprolol tartrate (LOPRESSOR) 25 MG tablet Take 0.5 tablets (12.5 mg total) by mouth 2 (two) times daily. Qty: 60 tablet, Refills: 0    midodrine (PROAMATINE) 10 MG tablet Take 1 tablet (10 mg total) by mouth 2 (two) times daily with a meal. Qty: 60 tablet, Refills: 0      CONTINUE these medications which have NOT CHANGED   Details  aspirin EC 81 MG tablet Take 81 mg by mouth daily.    insulin aspart (NOVOLOG) 100 UNIT/ML injection Inject 6 Units into the skin 3 (three) times daily with meals.     insulin glargine (LANTUS) 100 UNIT/ML injection Inject 50 Units into the skin 2 (two) times daily.     levothyroxine (SYNTHROID, LEVOTHROID) 75 MCG tablet Take 75 mcg by mouth daily before breakfast.    Multiple Vitamins-Minerals (MULTIVITAMIN PO) Take 1 tablet by mouth daily.    oxyCODONE-acetaminophen (PERCOCET/ROXICET) 5-325 MG tablet Take 1 tablet by mouth every 6 (six) hours as needed. Qty: 15 tablet, Refills: 0    acetaminophen (TYLENOL) 325 MG tablet Take 650 mg by mouth every 6 (six) hours as needed for mild pain.    albuterol (PROVENTIL HFA;VENTOLIN HFA) 108 (90 BASE) MCG/ACT inhaler Inhale 1-2 puffs into the lungs every 6 (six) hours as needed for wheezing or shortness of breath.      STOP taking these medications     metoprolol succinate (TOPROL-XL) 50 MG 24 hr tablet      potassium chloride SA (KLOR-CON M20) 20 MEQ tablet        Allergies  Allergen Reactions  . Ciprofloxacin Itching  . Epinephrine     Increased heart rate   Follow-up Information    Follow up with [facility].   Why:  They will do your home health care at your home   Contact information:   [address] [place] [address] [phone]       Follow up with [name], MD. Schedule an appointment as soon as possible for a visit in 2 weeks.  Specialty:  Internal Medicine   Contact information:   [address] [place] [address] [phone]       Follow up with Thurmon FairROITORU,MIHAI, MD. Call in 2 weeks.   Specialty:  Cardiology   Contact information:   [address] [place] [address] [phone]        The results of significant diagnostics from this hospitalization (including imaging, microbiology, ancillary and laboratory) are listed below for reference.    Significant Diagnostic Studies: [place] Transvaginal Non-ob  07/14/2015  CLINICAL DATA:  Vaginal bleeding. EXAM: TRANSABDOMINAL AND TRANSVAGINAL ULTRASOUND OF PELVIS  TECHNIQUE: Both transabdominal and transvaginal ultrasound examinations of the pelvis were performed. Transabdominal technique was performed for global imaging of the pelvis including uterus, ovaries, adnexal regions, and pelvic cul-de-sac. It was necessary to proceed with endovaginal exam following the transabdominal exam to visualize the cervix and endometrium to better advantage. COMPARISON:  None FINDINGS: Uterus Measurements: 5.5 x 3.8 x 4.1 cm. No fibroids or other mass visualized. Endometrium Thickness: 13.2 mm.  No focal abnormality visualized. Right ovary Not visualized.  No adnexal mass. Left ovary Not visualized.  No adnexal mass. Other findings Trace pelvic free fluid. IMPRESSION: 1. Exam limited due to body habitus. 2. Uterus normal in size.  No uterine mass. 3. Endometrium measures 13.2 mm in thickness. No endometrial mass seen. In the setting of post-menopausal bleeding, endometrial sampling is indicated to exclude carcinoma. If results are benign, sonohysterogram should be considered for focal lesion work-up. (Ref: Radiological Reasoning: Algorithmic Workup of Abnormal Vaginal Bleeding with Endovaginal Sonography and Sonohysterography. AJR 2008; 578:[id number]) 4. Neither ovary visualized.  No adnexal masses. Electronically Signed   By: Amie Portlandavid  Ormond M.D.   On: 07/14/2015 12:50   [place] Pelvis Complete  07/14/2015  CLINICAL DATA:  Vaginal bleeding. EXAM: TRANSABDOMINAL AND TRANSVAGINAL ULTRASOUND OF PELVIS TECHNIQUE: Both transabdominal and transvaginal ultrasound examinations of the pelvis were performed. Transabdominal technique was performed for global imaging of the pelvis including uterus, ovaries, adnexal regions, and pelvic cul-de-sac. It was necessary to proceed with endovaginal exam following the transabdominal exam to visualize the cervix and endometrium to better advantage. COMPARISON:  None FINDINGS: Uterus Measurements: 5.5 x 3.8 x 4.1 cm. No fibroids or other mass visualized. Endometrium  Thickness: 13.2 mm.  No focal abnormality visualized. Right ovary Not visualized.  No adnexal mass. Left ovary Not visualized.  No adnexal mass. Other findings Trace pelvic free fluid. IMPRESSION: 1. Exam limited due to body habitus. 2. Uterus normal in size.  No uterine mass. 3. Endometrium measures 13.2 mm in thickness. No endometrial mass seen. In the setting of post-menopausal bleeding, endometrial sampling is indicated to exclude carcinoma. If results are benign, sonohysterogram should be considered for focal lesion work-up. (Ref: Radiological Reasoning: Algorithmic Workup of Abnormal Vaginal Bleeding with Endovaginal Sonography and Sonohysterography. AJR 2008; 952:[id number]) 4. Neither ovary visualized.  No adnexal masses. Electronically Signed   By: Amie Portlandavid  Ormond M.D.   On: 07/14/2015 12:50   Dg Chest Port 1 View  07/10/2015  CLINICAL DATA:  Acute respiratory failure. EXAM: PORTABLE CHEST 1 VIEW COMPARISON:  07/08/2015, 06/24/2015 and 04/06/2015 FINDINGS: NG tube and ET tube have been removed. Central catheter is are in good position, unchanged. Pulmonary vascular congestion has resolved. Lungs are now clear. No effusions. IMPRESSION: Resolution of pulmonary vascular congestion. Electronically Signed   By: Francene BoyersJames  Maxwell M.D.   On: 07/10/2015 07:23   Dg Chest Port 1 View  07/08/2015  CLINICAL DATA:  Intubated.  Central line placement. EXAM: PORTABLE CHEST 1 VIEW COMPARISON:  Earlier today. FINDINGS: Endotracheal tube in satisfactory position. Right jugular catheter tip in the superior vena cava. No pneumothorax. The left jugular double-lumen catheter is unchanged with the catheter tips in the region of the confluence of the innominate veins. Nasogastric tube extending into the stomach. Stable enlarged cardiac silhouette. Mild decrease in prominence of the pulmonary vasculature. Stable mildly prominent interstitial markings. No pleural fluid. Unremarkable bones. IMPRESSION: 1. Endotracheal tube in  satisfactory position. 2. The left jugular double-lumen catheter remains proximally positioned with the catheter tips in the region of the confluence of the innominate veins. 3. Mildly improved pulmonary vascular congestion. 4. Stable cardiomegaly and mild chronic interstitial lung disease. Electronically Signed   By: Beckie Salts M.D.   On: 07/08/2015 17:17   Dg Chest Port 1 View  07/08/2015  CLINICAL DATA:  Post cardiac arrest.  Intubation. EXAM: PORTABLE CHEST 1 VIEW COMPARISON:  06/24/2015 FINDINGS: Endotracheal tube is in place with tip in the lower trachea estimated to be approximately 4.7 cm above the carina. Patient has a left internal jugular central venous line, tip overlying the level of the superior vena cava at its confluence with the brachiocephalic vein. Heart is enlarged. There is mild pulmonary vascular congestion. No overt edema identified. No acute fractures or obvious mass pneumothorax. IMPRESSION: 1. Interval intubation. 2. Mild pulmonary vascular congestion. Electronically Signed   By: [name] M.D.   On: 07/08/2015 16:02   Dg Chest Port 1 View  06/24/2015  CLINICAL DATA:  Evaluate for pneumothorax appy catheter placement 04/06/2015 EXAM: PORTABLE CHEST 1 VIEW COMPARISON:  04/06/2015 FINDINGS: Dialysis catheter from the left with tips at the upper SVC. No pneumothorax or new mediastinal widening. Chronic cardiomegaly. Mediastinal contours are distorted by rightward rotation. No effusion or edema. IMPRESSION: No pneumothorax after dialysis catheter placement. Electronically Signed   By: Marnee Spring M.D.   On: 06/24/2015 12:12   Dg Abd Portable 1v  07/08/2015  CLINICAL DATA:  Enteric tube placement. EXAM: PORTABLE ABDOMEN - 1 VIEW COMPARISON:  06/22/2014 abdominal radiograph FINDINGS: Enteric tube terminates in distal stomach. No dilated small bowel loops. Mild stool throughout the colon. No evidence of pneumatosis or pneumoperitoneum. No pathologic soft tissue  calcifications. IMPRESSION: Enteric tube terminates in the distal stomach. Electronically Signed   By: Delbert Phenix M.D.   On: 07/08/2015 17:18   Dg Fluoro Guide Cv Line-no Report  06/24/2015  CLINICAL DATA:  FLOURO GUIDE CV LINE Fluoroscopy was utilized by the requesting physician.  No radiographic interpretation.   Ct Portable Head W/o Cm  07/08/2015  CLINICAL DATA:  59 year old female status post cardiac arrest. Initial encounter. EXAM: PORTABLE CT HEAD WITHOUT CONTRAST TECHNIQUE: Portable CT axial images were obtained in the ICU. COMPARISON:  Head CT without contrast 06/25/2014. FINDINGS: Portable examination due to the patient's un stable condition in the ICU. However, due to body habitus the caudal aspect of the brain, below the cerebral peduncles, could not be imaged. Visible cerebral volume is stable since 2015. 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 No ventriculomegaly. No midline shift. Visible basilar cisterns are patent. No intracranial mass effect is evident. Partially visible hypodensity in the left occipital pole, appears stable to the prior study. Visualized paranasal sinuses and mastoids are clear. No acute osseous abnormality identified. No acute orbit or scalp soft tissue findings. Calcified supraclinoid ICA atherosclerosis. IMPRESSION: 1. Portable CT head exam in the ICU, during which the caudal half of  the brain could not be imaged. 2. Visualized brain parenchyma is stable since 2015, with chronic ischemic changes in the left hemisphere. Electronically Signed   By: Odessa Fleming M.D.   On: 07/08/2015 20:41    Microbiology: No results found for this or any previous visit (from the past 240 hour(s)).   Labs: Basic Metabolic Panel:  Recent Labs Lab 07/13/15 2224 07/15/15 0220 07/16/15 0845 07/17/15 0707 07/19/15 0810  NA 131* 135 134* 132* 129*  K 4.1 4.2 3.5 3.2* 3.8  CL 94* 100* 96* 96* 93*  CO2 GLUCOSE 171* 195* 176* 113* 289*   BUN 30* 31*  CREATININE 3.14* 2.56* 3.06* 3.03* 5.01*  CALCIUM 9.2 9.2 9.5 9.3 9.5  PHOS 3.9  --  3.6 4.3 5.1*   Liver Function Tests:  Recent Labs Lab 07/13/15 2224 07/16/15 0845 07/17/15 0707 07/19/15 0810  ALBUMIN 3.0* 3.1* 3.1* 3.3*   No results for input(s): LIPASE, AMYLASE in the last 168 hours. No results for input(s): AMMONIA in the last 168 hours. CBC:  Recent Labs Lab 07/16/15 0308 07/17/15 0707 07/18/15 0100 07/19/15 0245 07/20/15 0200  WBC 10.4 11.6* 14.0* 12.2* 10.9*  HGB 9.6* 10.7* 11.6* 11.2* 10.9*  HCT 30.6* 33.6* 36.3 34.7* 33.7*  MCV 103.7* 100.3* 98.9 97.7 98.3  PLT 126* 142* 162 154 146*   Cardiac Enzymes: No results for input(s): CKTOTAL, CKMB, CKMBINDEX, TROPONINI in the last 168 hours. BNP: BNP (last 3 results)  Recent Labs  04/06/15 1413 04/08/15 0526  BNP 510.5* 127.0*    ProBNP (last 3 results) No results for input(s): PROBNP in the last 8760 hours.  CBG:  Recent Labs Lab 07/19/15 0851 07/19/15 1150 07/19/15 1641 07/19/15 2211 07/20/15 0614  GLUCAP 191* 133* 196* 262* 237*       Signed:  Veanna Dower  [facility] 07/20/2015, 11:06 AM

## 2015-07-29 ENCOUNTER — Telehealth: Payer: Self-pay | Admitting: Gynecologic Oncology

## 2015-07-29 ENCOUNTER — Encounter: Payer: Self-pay | Admitting: Internal Medicine

## 2015-07-29 ENCOUNTER — Ambulatory Visit (INDEPENDENT_AMBULATORY_CARE_PROVIDER_SITE_OTHER): Payer: Managed Care, Other (non HMO) | Admitting: Internal Medicine

## 2015-07-29 VITALS — BP 128/68 | HR 85 | Ht 64.0 in | Wt 271.8 lb

## 2015-07-29 DIAGNOSIS — J9611 Chronic respiratory failure with hypoxia: Secondary | ICD-10-CM | POA: Diagnosis not present

## 2015-07-29 NOTE — Patient Instructions (Addendum)
I agree with Dr Shelle Ironlance your main problem is restrictive from your belly sitting on your diaphragm and this is main challenge for having surgery in the future  If you do have surgery the main challenges are early mobilization and minimization of analgesics/sedatives but you are cleared from a pulmonary perspective  Pulmonary follow up is as needed

## 2015-07-29 NOTE — Assessment & Plan Note (Signed)
Complicated by aodm and restrictive physiology on pfts   Body mass index is 46.63 kg/(m^2).  Lab Results  Component Value Date   TSH 9.120* 06/24/2014     Contributing to gerd tendency/ doe/reviewed the need and the process to achieve and maintain neg calorie balance > defer f/u primary care including intermittently monitoring thyroid status

## 2015-07-29 NOTE — Progress Notes (Signed)
Subjective:    Patient ID: Galen ManilaSusan K Kyer, female    DOB: August 13, 1956, 59 y.o.   MRN: [id number]   Clance eval  08/05/14  The patient is a 59 year old female who I've been asked to see for possible obstructive sleep apnea and COPD. She is morbidly obese, and recently admitted to the hospital with acute on chronic hypercarbic respiratory failure. She has known chronic diastolic heart failure and mitral valve disease, and was found to have a very elevated pulmonary capillary wedge pressure during that admission. She was diuresed aggressively with good improvement. She was noted to have significant hypercarbia that was well compensated, and clearly this is consistent with obesity hypoventilation. The question has been raised whether she may have sleep apnea, but her bed partner denies snoring, and she feels that she is rested in the mornings upon arising. She also denies inappropriate daytime sleepiness. However, she has gained almost 50 pounds over the last 2 years. She has chronic dyspnea on exertion with only minimal activity, but denies any significant coughing. She does have a history of smoking, but has not done so since 2007. She has been labeled as having COPD, but has never had pulmonary function studies. She also notes chronic lower extremity edema. rec Check pfts >  Restrictive and refused sleep study   Admit date: 07/08/2015 Discharge date: 07/20/2015  Discharge Diagnoses:  Principal Problem:  Acute respiratory failure with hypoxemia [facility](HCC) with cardiopulmonary arrest   Active Problems:  CAD (coronary artery disease), native coronary artery  Chronic diastolic CHF (congestive heart failure) (HCC)  Mitral valve stenosis, moderate  Morbid obesity (HCC)  Physical deconditioning  Obesity hypoventilation syndrome (HCC)  ESRD (end stage renal disease) (HCC)  Paroxysmal atrial fibrillation (HCC)  Altered mental status  Pulmonary arterial hypertension (HCC)  Acute on chronic  diastolic congestive heart failure (HCC)  Vaginal bleeding  Hypotension  Uncontrolled type 2 diabetes mellitus (HCC)    07/29/2015  Ext Post hosp f/u ov/Cylah Fannin re: asthma as child/ outgrew despite smoking quit 2008 with restrictive pfts related to obesity/ L VC paralysis p carotid surgery  Chief Complaint  Patient presents with  . HFU    Pt states breathing is "fine" she c/o sinus irritation which she relates to allergies. She rarely uses albuterol.  On 02 x 2lpm increase 4 lpm > across the room but also limited by balance / dm neuropathy  No obvious day to day or daytime variability or assoc chronic cough or cp or chest tightness, subjective wheeze or overt   hb symptoms. No unusual exp hx or h/o childhood pna/ asthma or knowledge of premature birth.  Sleeping ok on 2 pillows  without nocturnal  or early am exacerbation  of respiratory  c/o's or need for noct saba. Also denies any obvious fluctuation of symptoms with weather or environmental changes or other aggravating or alleviating factors except as outlined above   Current Medications, Allergies, Complete Past Medical History, Past Surgical History, Family History, and Social History were reviewed in [facility] electronic medical record.  ROS  The following are not active complaints unless bolded sore throat, dysphagia, dental problems, itching, sneezing,  nasal congestion or excess/ purulent secretions, ear ache,   fever, chills, sweats, unintended wt loss, classically pleuritic or exertional cp, hemoptysis,  orthopnea pnd or leg swelling, presyncope, palpitations, abdominal pain, anorexia, nausea, vomiting, diarrhea  or change in bowel or bladder habits, change in stools or urine, dysuria,hematuria,  rash, arthralgias, visual complaints, headache, numbness, weakness or ataxia  or problems with walking or coordination,  change in mood/affect or memory.            Objective:   Physical Exam   Constitutional:  Morbidly obese  hoarse  female, no acute distress in w/c on 2lpm    Wt Readings from Last 3 Encounters:  07/29/15 271 lb 12.8 oz (123.288 kg)  07/20/15 259 lb 15 oz (117.908 kg)  07/07/15 323 lb (146.512 kg)    Vital signs reviewed    HENT:  Nares patent without discharge  Oropharynx without exudate, palate and uvula are elongated.  Eyes:  Perrla, eomi, no scleral icterus  Neck:  No JVD, no TMG  Cardiovascular:  Normal rate, regular rhythm, no rubs or gallops.  No murmurs        Unable to palpate distal pulses.  Pulmonary :  Normal breath sounds, no stridor or respiratory distress   No rhonchi, or wheezing, minimal bibasilar insp crackles   Abdominal:  Soft, nondistended, bowel sounds present.  No tenderness noted.   Musculoskeletal:  3+ lower extremity edema noted.  Lymph Nodes:  No cervical lymphadenopathy noted  Skin:  No cyanosis noted  Neurologic:  Alert, appropriate, moves all 4 extremities without obvious deficit.     I personally reviewed images and agree with radiology impression as follows:  pCXR 07/10/15 Resolution of pulmonary vascular congestion.     Assessment & Plan:

## 2015-07-29 NOTE — Telephone Encounter (Signed)
Received fax for new patient referral.  Returned call and spoke with Myrene.  Patient appt made for Dec 5 at 9:45.  Dr. Lawana Chamberszan's office with notify the patient.

## 2015-07-29 NOTE — Assessment & Plan Note (Signed)
Chronic rx = 2lpm 24/7 except 4lpm with exertion    She was more hypercarbic but with CRI her bicarbs have "normalized" in mid 20s I agree with Dr Shelle Ironlance that this is entirely related to restrictive physiology from obesity and really no role for pulmonary meds or f/u  We discussed the risk of possible uterine surgery which are significant but certainly not prohibitive if needed for cancer surgery on her uterus.  I had an extended discussion with the patient reviewing all relevant studies completed to date including details from her admit) and  lasting 25minutes of a 40 minute visit    Each maintenance medication was reviewed in detail including most importantly the difference between maintenance and prns and under what circumstances the prns are to be triggered using an action plan format that is not reflected in the computer generated alphabetically organized AVS.    Please see instructions for details which were reviewed in writing and the patient given a copy highlighting the part that I personally wrote and discussed at today's ov.

## 2015-08-04 ENCOUNTER — Telehealth: Payer: Self-pay | Admitting: Cardiovascular Disease

## 2015-08-04 NOTE — Telephone Encounter (Signed)
Spoke to pt concerning her follow-up appt on 08/09/15. She is a patient of Dr. Jens Somrenshaw and was discharged from the hospital on 07/08/15 with a follow-up scheduled with Dr. Royann Shiversroitoru. Pt. States she saw Dr. Royann Shiversroitoru in the hospital one day and is ok with keeping her follow-up appointment with him.

## 2015-08-09 ENCOUNTER — Ambulatory Visit (INDEPENDENT_AMBULATORY_CARE_PROVIDER_SITE_OTHER): Payer: Managed Care, Other (non HMO) | Admitting: Cardiovascular Disease

## 2015-08-09 ENCOUNTER — Encounter: Payer: Self-pay | Admitting: Cardiovascular Disease

## 2015-08-09 VITALS — BP 124/62 | HR 87 | Resp 16 | Ht 64.0 in | Wt 269.0 lb

## 2015-08-09 DIAGNOSIS — I050 Rheumatic mitral stenosis: Secondary | ICD-10-CM

## 2015-08-09 DIAGNOSIS — I110 Hypertensive heart disease with heart failure: Secondary | ICD-10-CM | POA: Diagnosis not present

## 2015-08-09 DIAGNOSIS — I480 Paroxysmal atrial fibrillation: Secondary | ICD-10-CM | POA: Diagnosis not present

## 2015-08-09 DIAGNOSIS — I2510 Atherosclerotic heart disease of native coronary artery without angina pectoris: Secondary | ICD-10-CM

## 2015-08-09 DIAGNOSIS — I25118 Atherosclerotic heart disease of native coronary artery with other forms of angina pectoris: Secondary | ICD-10-CM

## 2015-08-09 DIAGNOSIS — N186 End stage renal disease: Secondary | ICD-10-CM

## 2015-08-09 DIAGNOSIS — I5032 Chronic diastolic (congestive) heart failure: Secondary | ICD-10-CM

## 2015-08-09 DIAGNOSIS — I272 Other secondary pulmonary hypertension: Secondary | ICD-10-CM

## 2015-08-09 DIAGNOSIS — I2721 Secondary pulmonary arterial hypertension: Secondary | ICD-10-CM

## 2015-08-09 NOTE — Progress Notes (Signed)
Patient ID: Kimberly [name], female   DOB: Sep 12, 1955, 59 y.o.   MRN: [id number]     Cardiology Office Note   Date:  08/09/2015   ID:  Kimberly [name], DOB 02-04-1956, MRN [id number]  PCP:  [name], MD  Cardiologist:   Thurmon Fair, MD   Chief Complaint  Patient presents with  . Follow-up    Diagnosed last week with uterine cancer.  Currently having vaginal and rectal bleeding.  waiting to get a call from the oncology group.  No complaints of chest pain, mild ankle edema, no dizziness.      History of Present Illness: Kimberly [name] is a 59 y.o. female who presents for follow-up after hospitalization for cardiopulmonary arrest that occurred during attempted placement of an AV fistula, complicated by paroxysmal atrial fibrillation and vaginal bleeding due to anticoagulation.  She has since been diagnosed with uterine cancer and has an appointment with Dr. Andrey Farmer on December 5.  She continues to have scanty vaginal bleeding and also describes mild rectal bleeding which is reportedly due to hemorrhoids.  She just saw Dr. Sherene Sires who believes her biggest problem is restrictive lung disease related to severe obesity.  However, he did not think she was at high risk with planned hysterectomy.  She has not had any palpitations since hospital discharge.  She denies exertional chest pain and denies syncope.  She is extremely sedentary.  She has not had orthopnea or PND.  Her edema has completely resolved, but she states that her abdomen still feels distended.  She has normal left ventricular systolic function but has mild-moderate mitral stenosis (mean gradient 5 mmHg) due to mitral annual calcification and severe pulmonary hypertension with an estimated peak systolic pressure of almost 80 mmHg, primarily due to left heart failure (right heart catheterization 2015 with severely elevated pulmonary artery wedge pressure), some component of chronic hypoventilation.  She has a history of previous  stroke and is at high risk for further embolic stroke (CHADSVasc score 6, risk even higher if one considers her Mitral stenosis, although it is unclear whether this is due to rheumatic valvular disease).  She has end-stage renal disease and has been on hemodialysis MWF via a catheter due to inability to place a permanent dialysis access secondary to poor quality peripheral veins.  She is essentially anuric.  Super morbidly obese. Estimated dry weight around 250 pounds one year ago.  Current weight around 270 pounds.    Past Medical History  Diagnosis Date  . HYPERLIPIDEMIA   . HYPERTENSION   . CAD, NATIVE VESSEL     May 10, 2010 cath showed a hyperdynamic LV function, she had dominant circumflex anatomy with a 70-80% small OM1. She had diffuse diabetic plaque particularly in the distal LAD. She nondominant RCA.  Nondominant  . PVD     CEA  . COPD   . CHF (congestive heart failure) (HCC)     Preserved EF  . Cellulitis 10/15/2013  . Diabetic neuropathy (HCC)   . Type II diabetes mellitus (HCC)   . Proliferative retinopathy     hx/notes 01/27/2010  . Peripheral neuropathy (HCC)     hx/notes 01/27/2010  . Asthma   . On home oxygen therapy     "2.5L; 24/7" (04/06/2015)  . Pneumonia "several times"  . Chronic bronchitis (HCC)     "often; usually q yr" (04/06/2015)  . Hypothyroidism   . History of hiatal hernia   . GERD (gastroesophageal reflux disease)   . 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 Arthritis     "aches and pains all over" (04/06/2015)  . Anxiety   . Depression   . Paralyzed vocal cords   . Sleep apnea     not on cpap    . Anemia   . End stage renal disease (HCC)     dialysis M-W-F    Past Surgical History  Procedure Laterality Date  . Carotid endarterectomy Left X 2  . Vitrectomy Bilateral   . Cataract extraction w/ intraocular lens  implant,  bilateral Bilateral   . Eye surgery      numerous surgeries  . Colonoscopy    . Insertion of dialysis catheter N/A 06/24/2015    Procedure: ULTRASOUND BILATERAL INTERNAL JUGULAR VEIN INSERTION OF DIALYSIS CATHETER LEFT INTERNAL JUGULAR VEIN ;  Surgeon: Pryor Ochoa, MD;  Location: [facility] OR;  Service: Vascular;  Laterality: N/A;  . Av fistula placement Right 07/08/2015    Procedure: EXPLORATION RIGHT AXILLARY ARTERY AND RIGHT BRACHIAL VEIN;  Surgeon: Chuck Hint, MD;  Location: [facility] OR;  Service: Vascular;  Laterality: Right;     Current Outpatient Prescriptions  Medication Sig Dispense Refill  . acetaminophen (TYLENOL) 325 MG tablet Take 650 mg by mouth every 6 (six) hours as needed for mild pain.    Marland Kitchen aspirin EC 81 MG tablet Take 81 mg by mouth daily.    Marland Kitchen docusate sodium (COLACE) 100 MG capsule Take 100 mg by mouth daily as needed for mild constipation.    . hydrocerin (EUCERIN) CREA Apply 1 application topically 2 (two) times daily. 113 g 0  . insulin aspart (NOVOLOG) 100 UNIT/ML injection Inject 6 Units into the skin 3 (three) times daily with meals.    . insulin glargine (LANTUS) 100 UNIT/ML injection Inject 50 Units into the skin 2 (two) times daily.     Marland Kitchen levothyroxine (SYNTHROID, LEVOTHROID) 75 MCG tablet Take 75 mcg by mouth daily before breakfast.    . metoprolol tartrate (LOPRESSOR) 25 MG tablet Take 0.5 tablets (12.5 mg total) by mouth 2 (two) times daily. 60 tablet 0  . midodrine (PROAMATINE) 10 MG tablet Take 1 tablet (10 mg total) by mouth 2 (two) times daily with a meal. 60 tablet 0  . OXYGEN 2lpm 24/7- [facility]     No current facility-administered medications for this visit.    Allergies:   Ciprofloxacin and Epinephrine    Social History:  The patient  reports that she quit smoking about 9 years ago. Her smoking use included Cigarettes. She has a 20 pack-year smoking history. She has never used smokeless tobacco. She reports that she does not drink alcohol or use  illicit drugs.   Family History:  The patient's family history includes Asthma in her father; CAD (age of onset: 55) in her father; Cancer in her mother; Heart attack in her father; Heart disease in her father; Hypertension in her father; Peripheral vascular disease in her father; Stroke in her mother.    ROS:  Please see the history of present illness.    Otherwise, review of systems positive for none.   All other systems are reviewed and negative.    PHYSICAL EXAM: VS:  BP 124/62 mmHg  Pulse 87  Resp 16  Ht 5\' 4"  (1.626 m)  Wt 269 lb (122.018 kg)  BMI 46.15 kg/m2 , BMI Body mass index is 46.15 kg/(m^2).  General: Alert, oriented x3, no distress, super obesity limits the exam Head: no evidence of trauma, PERRL, EOMI, no exophtalmos or lid lag, no myxedema, no xanthelasma; normal ears, nose and oropharynx Neck: Cannot easily see the jugular venous pulsations and no hepatojugular reflux; brisk carotid pulses without delay and no carotid bruits Chest: clear to auscultation, no signs of consolidation by percussion or palpation, normal fremitus, symmetrical and full respiratory excursions.  Tunneled dialysis catheter in the left subclavian area Cardiovascular: normal position and quality of the apical impulse, regular rhythm, normal first and paradoxically split second heart sounds, no murmurs, rubs or gallops Abdomen: Extensive pannus but no obvious distention, normal bowel sounds, unable to palpate the liver or spleen  Extremities: no clubbing, cyanosis or edema; 2+ radial, ulnar and brachial pulses bilaterally; 2+ posterior tibial and dorsalis pedis pulses; 2+ posterior tibial and dorsalis pedis pulses; no subclavian bruits.  Unable to evaluate the femoral or popliteal arteries Neurological: grossly nonfocal Psych: euthymic mood, full affect   EKG:  EKG is ordered today. The ekg ordered today demonstrates sinus rhythm, left bundle branch block 144 ms, QTC 515 ms   Recent  Labs: 04/08/2015: B Natriuretic Peptide 127.0* 07/09/2015: ALT 35 07/10/2015: Magnesium 1.9 07/19/2015: BUN 31*; Creatinine, Ser 5.01*; Potassium 3.8; Sodium 129* 07/20/2015: Hemoglobin 10.9*; Platelets 146*    Lipid Panel No results found for: CHOL, TRIG, HDL, CHOLHDL, VLDL, LDLCALC, LDLDIRECT    Wt Readings from Last 3 Encounters:  08/09/15 269 lb (122.018 kg)  07/29/15 271 lb 12.8 oz (123.288 kg)  07/20/15 259 lb 15 oz (117.908 kg)       ASSESSMENT AND PLAN:  1.  Preoperative cardiovascular risk evaluation- during her attempted AV fistula placement, respiratory arrest clearly preceded cardiac arrest (PEA). She received very little sedation before her local anesthetic was administered (1 mg Versed + Propofol 25 mcg/kg/min for a few minutes), but has had problems with exaggerated response to sedation before.  Careful management of the airway, avoidance of supine position when she is extubated, avoidance of hypervolemia, using small doses of sedative agents are all important measures to prevent a similar event from happening. Otherwise, her risk for primary cardiac complications is moderate.  2. Paroxysmal atrial fibrillation - she has frequently had sustained irregular palpitations at home before the atrial fibrillation was detected during her hospitalization. High risk for embolic stroke. Has had a previous stroke. All major stroke risk factors are present except age, CHADS Vasc 6. Anticoagulation on hold due to vaginal bleeding and recent diagnosis of uterine cancer. Maintaining sinus rhythm.  Warfarin is the best option for anticoagulation both due to the presence of end-stage  renal disease and mitral stenosis.  Plan to restart it after hysterectomy, if this is to be performed.  3. Diastolic HF, acute on chronic - now on daily HD. Previous estimated dry weight was 250 lb. she believes she still has substantial abdominal distention and is well above the previous dry weight  estimation.  4. Severe pulmonary artery HTN - at cath 2015 was mostly due to pulmonary venous HTN (diastolic LV dysfunction +/- mild mitral stenosis), but there was also a component of intrinsic pulmonary arteriolar disease (obesity, OSA, COPD).  5.  Coronary artery disease - .  Her cardiac catheterization showed relatively mild disease in the distal LAD and in a very small first oblique marginal artery (2011).  She is not a good candidate for invasive evaluation and therapy.  No recent symptoms of angina pectoris.  6. ESRD.  May still be hypervolemic  7. Uterine cancer/Vaginal bleeding  appointment with Dr. Andrey Farmerossi December 5.  8.  Morbid obesity/chronic hypoventilation     Current medicines are reviewed at length with the patient today.  The patient does not have concerns regarding medicines.  The following changes have been made:  no change  Labs/ tests ordered today include:  Orders Placed This Encounter  Procedures  . EKG 12-Lead   Patient Instructions  Dr. Royann Shiversroitoru recommends that you schedule a follow-up appointment in: 2 MONTHS  If you need a refill on your cardiac medications before your next appointment, please call your pharmacy.        Joie BimlerSigned, Manan Olmo, MD  08/09/2015 11:04 AM    Thurmon FairMihai Nachmen Mansel, MD, [facility] [phone] office [phone] pager

## 2015-08-09 NOTE — Patient Instructions (Signed)
Dr. Royann Shiversroitoru recommends that you schedule a follow-up appointment in: 2 MONTHS  If you need a refill on your cardiac medications before your next appointment, please call your pharmacy.

## 2015-08-16 ENCOUNTER — Ambulatory Visit: Payer: Managed Care, Other (non HMO) | Attending: Gynecologic Oncology | Admitting: Gynecologic Oncology

## 2015-08-16 ENCOUNTER — Encounter: Payer: Self-pay | Admitting: Gynecologic Oncology

## 2015-08-16 VITALS — BP 111/56 | HR 90 | Temp 97.7°F | Resp 18 | Ht 64.0 in | Wt 271.6 lb

## 2015-08-16 DIAGNOSIS — N8502 Endometrial intraepithelial neoplasia [EIN]: Secondary | ICD-10-CM | POA: Diagnosis present

## 2015-08-16 DIAGNOSIS — Z6841 Body Mass Index (BMI) 40.0 and over, adult: Secondary | ICD-10-CM | POA: Diagnosis not present

## 2015-08-16 DIAGNOSIS — E119 Type 2 diabetes mellitus without complications: Secondary | ICD-10-CM

## 2015-08-16 DIAGNOSIS — Z992 Dependence on renal dialysis: Secondary | ICD-10-CM

## 2015-08-16 DIAGNOSIS — N186 End stage renal disease: Secondary | ICD-10-CM | POA: Diagnosis not present

## 2015-08-16 MED ORDER — LEVONORGESTREL 20 MCG/24HR IU IUD: 1.0000 | INTRAUTERINE_SYSTEM | Freq: Once | INTRAUTERINE | Status: DC

## 2015-08-16 NOTE — Progress Notes (Signed)
Consult Note: Gyn-Onc  Consult was requested by Dr. Charlotta Newton for the evaluation of Kimberly [name] 59 y.o. female  CC:  Chief Complaint  Patient presents with  . endometrial hyperplasia    Assessment/Plan:  Ms. Kimberly [name]  is a 59 y.o.  year old with multiple sever medical comorbidities including morbid obesity (BMI 47), ESRD secondary to DM on hemodialysis, OSA, COPD, coronary artery disease, CHF, afib, corotid vascular disease, Hx of MI and CVA with a new diagnosis of complex endometrial hyperplasia with atypia on endometrial biopsy. This was identified in the setting of PMB while on coumadin. She is no longer on coumadin and the bleeding has stopped.  Ms [name] is not a surgical candidate due to the above stated medical comorbidities. I discussed that an alternative to hysterectomy is the delivery of progestin either systemically or by her a progestin releasing IUD. I believe in her case a progestin releasing IUD would be most beneficial as it would avoid the side effects and potential toxicity of systemic progestins (such as increased risk of VTE events and worsening of heart failure). I discussed that IUDs are successful in causing reversal of this process in approximately 40% of cases and stabilizing control bleeding in almost all cases. I discussed that IUDs are associated with risks including perforation of the uterine wall and side effects including abnormal irregular vaginal spotting for the first 6 months but this almost always goes away and the patient should continue use of the IUD through this side effect if tolerable.  After hearing these options she has elected for progestin releasing IUD.  We will have her return to the office for IUD placement.   HPI: Kimberly [name] is a 59 year old [id number] who is seen in consultation at the request of Dr Charlotta Newton for [facility]. The patient experienced light postmenopausal bleeding this year, while on coumadin for A fib and the AV fistula she had placed. This  prompted a TVUS to be performed which revealed a small uterus (5.5x3.8x4.1cm) with ovary is not visualized bilaterally but no adnexal masses seen. The endometrial stripe on this ultrasound was 13.2 mm. The date of this ultrasound was 07/14/2015. At the time of this evaluation the patient was inpatient at [facility] for placement of an AV fistula with concrete current myocardial infarction while under anesthesia. During her hospitalization she was evaluated by Dr. Myna Hidalgo who performed an endometrial biopsy which revealed complex atypical hyperplasia. The pathologists had some concern for an invasive component but none was definitively seen.  Of note Kimberly [name] has extreme medical comorbidities. She is morbidly obese with a BMI of 47 kg/m. She has a history of tobacco abuse and COPD with home O2 use secondary to COPD and congestive heart failure. She has a history of  coronary, and cerebrovascular disease with carotid artery stenosis and history of CVA causing right-sided paresthesia, multiple myocardial infarctions (including 3 during general anesthesia for surgical procedures) with associated congestive heart failure, and atrial fibrillation. She also has a history of diabetes mellitus which has caused end-stage renal disease for which she is now on hemodialysis since October 2016.  Current Meds:  Outpatient Encounter Prescriptions as of 08/16/2015  Medication Sig  . acetaminophen (TYLENOL) 325 MG tablet Take 650 mg by mouth every 6 (six) hours as needed for mild pain.  Marland Kitchen aspirin EC 81 MG tablet Take 81 mg by mouth daily.  Marland Kitchen docusate sodium (COLACE) 100 MG capsule Take 100 mg by mouth daily as  needed for mild constipation.  . hydrocerin (EUCERIN) CREA Apply 1 application topically 2 (two) times daily.  . insulin aspart (NOVOLOG) 100 UNIT/ML injection Inject 6 Units into the skin 3 (three) times daily with meals.  . insulin glargine (LANTUS) 100 UNIT/ML injection Inject 50 Units into the  skin 2 (two) times daily.   Marland Kitchen levothyroxine (SYNTHROID, LEVOTHROID) 75 MCG tablet Take 75 mcg by mouth daily before breakfast.  . metoprolol tartrate (LOPRESSOR) 25 MG tablet Take 0.5 tablets (12.5 mg total) by mouth 2 (two) times daily.  . midodrine (PROAMATINE) 10 MG tablet Take 1 tablet (10 mg total) by mouth 2 (two) times daily with a meal.  . OXYGEN 2lpm 24/7- [facility]  . levonorgestrel (MIRENA) 20 MCG/24HR IUD 1 Intra Uterine Device (1 each total) by Intrauterine route once.   No facility-administered encounter medications on file as of 08/16/2015.    Allergy:  Allergies  Allergen Reactions  . Ciprofloxacin Itching  . Epinephrine     Increased heart rate    Social Hx:   Social History   Social History  . Marital Status: Married    Spouse Name: N/A  . Number of Children: 0  . Years of Education: N/A   Occupational History  . disabled    Social History Main Topics  . Smoking status: Former Smoker -- 1.00 packs/day for 20 years    Types: Cigarettes    Quit date: 09/11/2005  . Smokeless tobacco: Never Used  . Alcohol Use: No  . Drug Use: No  . Sexual Activity: No   Other Topics Concern  . Not on file   Social History Narrative    Past Surgical Hx:  Past Surgical History  Procedure Laterality Date  . Carotid endarterectomy Left X 2  . Vitrectomy Bilateral   . Cataract extraction w/ intraocular lens  implant, bilateral Bilateral   . Eye surgery      numerous surgeries  . Colonoscopy    . Insertion of dialysis catheter N/A 06/24/2015    Procedure: ULTRASOUND BILATERAL INTERNAL JUGULAR VEIN INSERTION OF DIALYSIS CATHETER LEFT INTERNAL JUGULAR VEIN ;  Surgeon: Pryor Ochoa, MD;  Location: [facility] OR;  Service: Vascular;  Laterality: N/A;  . Av fistula placement Right 07/08/2015    Procedure: EXPLORATION RIGHT AXILLARY ARTERY AND RIGHT BRACHIAL VEIN;  Surgeon: Chuck Hint, MD;  Location: [facility] OR;  Service: Vascular;  Laterality: Right;    Past Medical Hx:  Past  Medical History  Diagnosis Date  . HYPERLIPIDEMIA   . HYPERTENSION   . CAD, NATIVE VESSEL     May 10, 2010 cath showed a hyperdynamic LV function, she had dominant circumflex anatomy with a 70-80% small OM1. She had diffuse diabetic plaque particularly in the distal LAD. She nondominant RCA.  Nondominant  . PVD     CEA  . COPD   . CHF (congestive heart failure) (HCC)     Preserved EF  . Cellulitis 10/15/2013  . Diabetic neuropathy (HCC)   . Type II diabetes mellitus (HCC)   . Proliferative retinopathy     hx/notes 01/27/2010  . Peripheral neuropathy (HCC)     hx/notes 01/27/2010  . Asthma   . On home oxygen therapy     "2.5L; 24/7" (04/06/2015)  . Pneumonia "several times"  . Chronic bronchitis (HCC)     "often; usually q yr" (04/06/2015)  . Hypothyroidism   . History of hiatal hernia   . GERD (gastroesophageal reflux disease)   . 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 Arthritis     "aches and pains all over" (04/06/2015)  . Anxiety   . Depression   . Paralyzed vocal cords   . Sleep apnea     not on cpap    . Anemia   . End stage renal disease (HCC)     dialysis M-W-F    Past Gynecological History:  SAb x 2.  No LMP recorded. Patient is postmenopausal.  Family Hx:  Family History  Problem Relation Age of Onset  . Cancer Mother     Breast, NHL  . Stroke Mother   . Peripheral vascular disease Father   . CAD Father 7130  . Heart attack Father   . Hypertension Father   . Asthma Father   . Heart disease Father     before age 60    Review of Systems:  Constitutional  Feels well,    ENT Normal appearing ears and nares bilaterally Skin/Breast  No rash, sores, jaundice, itching, dryness Cardiovascular  + SOB, + edema Pulmonary  + cough + wheeze.  Gastro Intestinal  No nausea, vomitting, or diarrhoea. No bright red blood per rectum, no  abdominal pain, change in bowel movement, or constipation.  Genito Urinary  No frequency, urgency, dysuria, + PMB Musculo Skeletal  No myalgia, arthralgia, joint swelling or pain  Neurologic  + right sided weakness and decreased sensation Psychology  No depression, anxiety, insomnia.   Vitals:  Blood pressure 111/56, pulse 90, temperature 97.7 F (36.5 C), temperature source Oral, resp. rate 18, height 5\' 4"  (1.626 m), weight 271 lb 9.6 oz (123.197 kg), SpO2 96 %.  Physical Exam: WD in NAD Neck  Supple NROM, without any enlargements.  Lymph Node Survey No cervical supraclavicular or inguinal adenopathy Cardiovascular  Irregularly irregular Lungs  Decreased breath sounds bilaterally Skin  No rash/lesions/breakdown  Psychiatry  Alert and oriented to person, place, and time  Abdomen  Normoactive bowel sounds, abdomen soft, non-tender and obese without evidence of hernia.  Back No CVA tenderness Genito Urinary  Vulva/vagina: Normal external female genitalia.  No lesions. No discharge or bleeding.  Bladder/urethra:  No lesions or masses, well supported bladder  Vagina: normal  Cervix: Normal appearing, no lesions.  Uterus:  Small, mobile, no parametrial involvement or nodularity.  Adnexa: no palpable masses. Rectal  deferred Extremities  +edema.   Quinn Axeossi, Swan Zayed Caroline, MD  08/16/2015, 10:46 AM

## 2015-08-16 NOTE — Patient Instructions (Signed)
Follow up appointment for IUD placement on Thursday September 02, 2015 at 09:15 ,call with any changes , questions or concerns .  Thank you !

## 2015-08-22 ENCOUNTER — Other Ambulatory Visit: Payer: Self-pay | Admitting: Physician Assistant

## 2015-08-23 NOTE — Telephone Encounter (Signed)
REFILL 

## 2015-09-02 ENCOUNTER — Ambulatory Visit: Payer: Managed Care, Other (non HMO) | Attending: Gynecologic Oncology | Admitting: Gynecologic Oncology

## 2015-09-02 ENCOUNTER — Encounter: Payer: Self-pay | Admitting: Gynecologic Oncology

## 2015-09-02 VITALS — BP 156/69 | HR 89 | Temp 97.6°F | Wt 271.0 lb

## 2015-09-02 DIAGNOSIS — N8502 Endometrial intraepithelial neoplasia [EIN]: Secondary | ICD-10-CM | POA: Diagnosis not present

## 2015-09-02 NOTE — Patient Instructions (Signed)
Plan to follow up with Dr. Charlotta Newtonzan.  Please call for any questions or concerns.  Intrauterine Device Insertion, Care After Refer to this sheet in the next few weeks. These instructions provide you with information on caring for yourself after your procedure. Your health care provider may also give you more specific instructions. Your treatment has been planned according to current medical practices, but problems sometimes occur. Call your health care provider if you have any problems or questions after your procedure. WHAT TO EXPECT AFTER THE PROCEDURE Insertion of the IUD may cause some discomfort, such as cramping. The cramping should improve after the IUD is in place. You may have bleeding after the procedure. This is normal. It varies from light spotting for a few days to menstrual-like bleeding. When the IUD is in place, a string will extend past the cervix into the vagina for 1-2 inches. The strings should not bother you or your partner. If they do, talk to your health care provider.  HOME CARE INSTRUCTIONS   Check your intrauterine device (IUD) to make sure it is in place before you resume sexual activity. You should be able to feel the strings. If you cannot feel the strings, something may be wrong. The IUD may have fallen out of the uterus, or the uterus may have been punctured (perforated) during placement. Also, if the strings are getting longer, it may mean that the IUD is being forced out of the uterus. You no longer have full protection from pregnancy if any of these problems occur.  You may resume sexual intercourse if you are not having problems with the IUD. The copper IUD is considered immediately effective, and the hormone IUD works right away if inserted within 7 days of your period starting. You will need to use a backup method of birth control for 7 days if the IUD in inserted at any other time in your cycle.  Continue to check that the IUD is still in place by feeling for the strings  after every menstrual period.  You may need to take pain medicine such as acetaminophen or ibuprofen. Only take medicines as directed by your health care provider. SEEK MEDICAL CARE IF:   You have bleeding that is heavier or lasts longer than a normal menstrual cycle.  You have a fever.  You have increasing cramps or abdominal pain not relieved with medicine.  You have abdominal pain that does not seem to be related to the same area of earlier cramping and pain.  You are lightheaded, unusually weak, or faint.  You have abnormal vaginal discharge or smells.  You have pain during sexual intercourse.  You cannot feel the IUD strings, or the IUD string has gotten longer.  You feel the IUD at the opening of the cervix in the vagina.  You think you are pregnant, or you miss your menstrual period.  The IUD string is hurting your sex partner. MAKE SURE YOU:  Understand these instructions.  Will watch your condition.  Will get help right away if you are not doing well or get worse.   This information is not intended to replace advice given to you by your health care provider. Make sure you discuss any questions you have with your health care provider.   Document Released: 04/26/2011 Document Revised: 06/18/2013 Document Reviewed: 02/16/2013 Elsevier Interactive Patient Education [employer]2016 [employer]vier Inc.

## 2015-09-02 NOTE — Progress Notes (Signed)
Followup Note: Gyn-Onc  Consult was initially requested by Dr. Charlotta Newton for the evaluation of Kimberly [name] 59 y.o. female  CC:  Chief Complaint  Patient presents with  . endometrial hyperplasia    Follow up IUD placement    Assessment/Plan:  Ms. Kimberly [name]  is a 59 y.o.  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 This was identified in the setting of PMB while on coumadin. She is no longer on coumadin and the bleeding has stopped.  We placed the progesterone releasing IUD today. If bleeding is controlled with this, no further sampling is required at this juncture (as she is not a surgical candidate and we would therefore not intervene differently). However, if she develops repeated episodes of heavy bleeding, I recommend repeat office sampling.  I will return Ms Kimberly [name] back to Dr Charlotta Newton for ongoing followup. I recommend followup in 6 months.  HPI: Kimberly [name] is a 59 year old [id number] who is seen in consultation at the request of Dr Charlotta Newton for [facility]. The patient experienced light postmenopausal bleeding this year, while on coumadin for A fib and the AV fistula she had placed. This prompted a TVUS to be performed which revealed a small uterus (5.5x3.8x4.1cm) with ovary is not visualized bilaterally but no adnexal masses seen. The endometrial stripe on this ultrasound was 13.2 mm. The date of this ultrasound was 07/14/2015. At the time of this evaluation the patient was inpatient at [facility] for placement of an AV fistula with concrete current myocardial infarction while under anesthesia. During her hospitalization she was evaluated by Dr. Myna Hidalgo who performed an endometrial biopsy  which revealed complex atypical hyperplasia. The pathologists had some concern for an invasive component but none was definitively seen.  Of note Ms. Kimberly [name] has extreme medical comorbidities. She is morbidly obese with a BMI of 47 kg/m. She has a history of tobacco abuse and COPD with home O2 use secondary to COPD and congestive heart failure. She has a history of  coronary, and cerebrovascular disease with carotid artery stenosis and history of CVA causing right-sided paresthesia, multiple myocardial infarctions (including 3 during general anesthesia for surgical procedures) with associated congestive heart failure, and atrial fibrillation. She also has a history of diabetes mellitus which has caused end-stage renal disease for which she is now on hemodialysis since October 2016.  Interval Hx: She has continued to have no vaginal bleeding  Current Meds:  Outpatient Encounter Prescriptions as of 09/02/2015  Medication Sig  . acetaminophen (TYLENOL) 325 MG tablet Take 650 mg by mouth every 6 (six) hours as needed for mild pain.  Marland Kitchen aspirin EC 81 MG tablet Take 81 mg by mouth daily.  Marland Kitchen docusate sodium (COLACE) 100 MG capsule Take 100 mg by mouth daily as needed for mild constipation.  . hydrocerin (EUCERIN) CREA Apply 1 application topically 2 (two) times daily.  . insulin aspart (NOVOLOG) 100 UNIT/ML injection Inject 6 Units into the skin 3 (three) times daily with meals.  . insulin glargine (LANTUS) 100 UNIT/ML injection Inject 50 Units into the skin 2 (two) times daily.   Marland Kitchen levonorgestrel (MIRENA) 20 MCG/24HR IUD 1 Intra Uterine Device (1 each total) by Intrauterine route once.  Marland Kitchen levothyroxine (SYNTHROID,  LEVOTHROID) 75 MCG tablet Take 75 mcg by mouth daily before breakfast.  . metoprolol succinate (TOPROL-XL) 50 MG 24 hr tablet TAKE 1 AND 1/2 TABLETS BY MOUTH EVERY DAY  . metoprolol tartrate (LOPRESSOR) 25 MG tablet Take 0.5 tablets (12.5 mg total) by mouth 2 (two) times daily.  . midodrine  (PROAMATINE) 10 MG tablet Take 1 tablet (10 mg total) by mouth 2 (two) times daily with a meal.  . OXYGEN 2lpm 24/7- [facility]   No facility-administered encounter medications on file as of 09/02/2015.    Allergy:  Allergies  Allergen Reactions  . Ciprofloxacin Itching  . Epinephrine     Increased heart rate    Social Hx:   Social History   Social History  . Marital Status: Married    Spouse Name: N/A  . Number of Children: 0  . Years of Education: N/A   Occupational History  . disabled    Social History Main Topics  . Smoking status: Former Smoker -- 1.00 packs/day for 20 years    Types: Cigarettes    Quit date: 09/11/2005  . Smokeless tobacco: Never Used  . Alcohol Use: No  . Drug Use: No  . Sexual Activity: No   Other Topics Concern  . Not on file   Social History Narrative    Past Surgical Hx:  Past Surgical History  Procedure Laterality Date  . Carotid endarterectomy Left X 2  . Vitrectomy Bilateral   . Cataract extraction w/ intraocular lens  implant, bilateral Bilateral   . Eye surgery      numerous surgeries  . Colonoscopy    . Insertion of dialysis catheter N/A 06/24/2015    Procedure: ULTRASOUND BILATERAL INTERNAL JUGULAR VEIN INSERTION OF DIALYSIS CATHETER LEFT INTERNAL JUGULAR VEIN ;  Surgeon: Pryor Ochoa, MD;  Location: [facility] OR;  Service: Vascular;  Laterality: N/A;  . Av fistula placement Right 07/08/2015    Procedure: EXPLORATION RIGHT AXILLARY ARTERY AND RIGHT BRACHIAL VEIN;  Surgeon: Chuck Hint, MD;  Location: [facility] OR;  Service: Vascular;  Laterality: Right;    Past Medical Hx:  Past Medical History  Diagnosis Date  . HYPERLIPIDEMIA   . HYPERTENSION   . CAD, NATIVE VESSEL     May 10, 2010 cath showed a hyperdynamic LV function, she had dominant circumflex anatomy with a 70-80% small OM1. She had diffuse diabetic plaque particularly in the distal LAD. She nondominant RCA.  Nondominant  . PVD     CEA  . COPD   . CHF (congestive  heart failure) (HCC)     Preserved EF  . Cellulitis 10/15/2013  . Diabetic neuropathy (HCC)   . Type II diabetes mellitus (HCC)   . Proliferative retinopathy     hx/notes 01/27/2010  . Peripheral neuropathy (HCC)     hx/notes 01/27/2010  . Asthma   . On home oxygen therapy     "2.5L; 24/7" (04/06/2015)  . Pneumonia "several times"  . Chronic bronchitis (HCC)     "often; usually q yr" (04/06/2015)  . Hypothyroidism   . History of hiatal hernia   . GERD (gastroesophageal reflux disease)   . 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 Arthritis     "aches and pains all over" (04/06/2015)  . Anxiety   . Depression   . Paralyzed vocal cords   . Sleep apnea  not on cpap    . Anemia   . End stage renal disease (HCC)     dialysis M-W-F  . Endometrial hyperplasia     Past Gynecological History:  SAb x 2.  No LMP recorded. Patient is postmenopausal.  Family Hx:  Family History  Problem Relation Age of Onset  . Cancer Mother     Breast, NHL  . Stroke Mother   . Peripheral vascular disease Father   . CAD Father [id number]  . Heart attack Father   . Hypertension Father   . Asthma Father   . Heart disease Father     before age 60    Review of Systems:  Constitutional  Feels well,    ENT Normal appearing ears and nares bilaterally Skin/Breast  No rash, sores, jaundice, itching, dryness Cardiovascular  + SOB, + edema Pulmonary  + cough + wheeze.  Gastro Intestinal  No nausea, vomitting, or diarrhoea. No bright red blood per rectum, no abdominal pain, change in bowel movement, or constipation.  Genito Urinary  No frequency, urgency, dysuria, + PMB Musculo Skeletal  No myalgia, arthralgia, joint swelling or pain  Neurologic  + right sided weakness and decreased sensation Psychology  No depression, anxiety, insomnia.   Vitals:  There were no vitals taken for  this visit.  Physical Exam: WD in NAD Neck  Supple NROM, without any enlargements.  Lymph Node Survey No cervical supraclavicular or inguinal adenopathy Cardiovascular  Irregularly irregular Lungs  Decreased breath sounds bilaterally Skin  No rash/lesions/breakdown  Psychiatry  Alert and oriented to person, place, and time  Abdomen  Normoactive bowel sounds, abdomen soft, non-tender and obese without evidence of hernia.  Back No CVA tenderness Genito Urinary  Vulva/vagina: Normal external female genitalia.  No lesions. No discharge or bleeding.  Bladder/urethra:  No lesions or masses, well supported bladder  Vagina: normal  Cervix: Normal appearing, no lesions.  Uterus:  Small, mobile, no parametrial involvement or nodularity.  Adnexa: no palpable masses. Rectal  deferred Extremities  +edema.  PROCEDURE NOTE: Mirena (progestin releasing) IUD placement  Patient provided verbal and written consent for an IUD placement. She was informed regarding risks including infection, bleeding, perforation of uterus, migration or expulsion of IUD.  She is placed in lithotomy position and speculum was inserted into the vagina. The cervix was visualized and grasped with single-tooth tenaculum. The uterus was sounded to 8 cm. The IUD was gently inserted to the uterine fundus and diployed. Strings were cut to 3 cm. Minimal bleeding was encountered including after removal of the tenaculum. Patient tolerated the procedure well. She is instructed to return to see Dr. Okey Dupreose on an approximate 6 months. Lot # [id number] exp date 04/19   Quinn Axeossi, Reily Treloar Caroline, MD  09/02/2015, 9:31 AM

## 2015-09-13 DIAGNOSIS — N186 End stage renal disease: Secondary | ICD-10-CM | POA: Diagnosis not present

## 2015-09-13 DIAGNOSIS — E119 Type 2 diabetes mellitus without complications: Secondary | ICD-10-CM | POA: Diagnosis not present

## 2015-09-13 DIAGNOSIS — D509 Iron deficiency anemia, unspecified: Secondary | ICD-10-CM | POA: Diagnosis not present

## 2015-09-13 DIAGNOSIS — D649 Anemia, unspecified: Secondary | ICD-10-CM | POA: Diagnosis not present

## 2015-09-13 DIAGNOSIS — E8779 Other fluid overload: Secondary | ICD-10-CM | POA: Diagnosis not present

## 2015-09-15 DIAGNOSIS — N186 End stage renal disease: Secondary | ICD-10-CM | POA: Diagnosis not present

## 2015-09-15 DIAGNOSIS — D649 Anemia, unspecified: Secondary | ICD-10-CM | POA: Diagnosis not present

## 2015-09-15 DIAGNOSIS — E119 Type 2 diabetes mellitus without complications: Secondary | ICD-10-CM | POA: Diagnosis not present

## 2015-09-15 DIAGNOSIS — E8779 Other fluid overload: Secondary | ICD-10-CM | POA: Diagnosis not present

## 2015-09-15 DIAGNOSIS — D509 Iron deficiency anemia, unspecified: Secondary | ICD-10-CM | POA: Diagnosis not present

## 2015-09-16 DIAGNOSIS — E119 Type 2 diabetes mellitus without complications: Secondary | ICD-10-CM | POA: Diagnosis not present

## 2015-09-16 DIAGNOSIS — E8779 Other fluid overload: Secondary | ICD-10-CM | POA: Diagnosis not present

## 2015-09-16 DIAGNOSIS — D509 Iron deficiency anemia, unspecified: Secondary | ICD-10-CM | POA: Diagnosis not present

## 2015-09-16 DIAGNOSIS — N186 End stage renal disease: Secondary | ICD-10-CM | POA: Diagnosis not present

## 2015-09-16 DIAGNOSIS — D649 Anemia, unspecified: Secondary | ICD-10-CM | POA: Diagnosis not present

## 2015-09-17 DIAGNOSIS — E119 Type 2 diabetes mellitus without complications: Secondary | ICD-10-CM | POA: Diagnosis not present

## 2015-09-17 DIAGNOSIS — N186 End stage renal disease: Secondary | ICD-10-CM | POA: Diagnosis not present

## 2015-09-17 DIAGNOSIS — D509 Iron deficiency anemia, unspecified: Secondary | ICD-10-CM | POA: Diagnosis not present

## 2015-09-17 DIAGNOSIS — D649 Anemia, unspecified: Secondary | ICD-10-CM | POA: Diagnosis not present

## 2015-09-17 DIAGNOSIS — E8779 Other fluid overload: Secondary | ICD-10-CM | POA: Diagnosis not present

## 2015-09-20 DIAGNOSIS — N186 End stage renal disease: Secondary | ICD-10-CM | POA: Diagnosis not present

## 2015-09-20 DIAGNOSIS — E119 Type 2 diabetes mellitus without complications: Secondary | ICD-10-CM | POA: Diagnosis not present

## 2015-09-20 DIAGNOSIS — D509 Iron deficiency anemia, unspecified: Secondary | ICD-10-CM | POA: Diagnosis not present

## 2015-09-20 DIAGNOSIS — D649 Anemia, unspecified: Secondary | ICD-10-CM | POA: Diagnosis not present

## 2015-09-20 DIAGNOSIS — E8779 Other fluid overload: Secondary | ICD-10-CM | POA: Diagnosis not present

## 2015-09-22 DIAGNOSIS — D509 Iron deficiency anemia, unspecified: Secondary | ICD-10-CM | POA: Diagnosis not present

## 2015-09-22 DIAGNOSIS — D649 Anemia, unspecified: Secondary | ICD-10-CM | POA: Diagnosis not present

## 2015-09-22 DIAGNOSIS — N186 End stage renal disease: Secondary | ICD-10-CM | POA: Diagnosis not present

## 2015-09-22 DIAGNOSIS — E8779 Other fluid overload: Secondary | ICD-10-CM | POA: Diagnosis not present

## 2015-09-22 DIAGNOSIS — E119 Type 2 diabetes mellitus without complications: Secondary | ICD-10-CM | POA: Diagnosis not present

## 2015-09-23 DIAGNOSIS — I5033 Acute on chronic diastolic (congestive) heart failure: Secondary | ICD-10-CM | POA: Diagnosis not present

## 2015-09-23 DIAGNOSIS — I480 Paroxysmal atrial fibrillation: Secondary | ICD-10-CM | POA: Diagnosis not present

## 2015-09-23 DIAGNOSIS — E1122 Type 2 diabetes mellitus with diabetic chronic kidney disease: Secondary | ICD-10-CM | POA: Diagnosis not present

## 2015-09-23 DIAGNOSIS — N186 End stage renal disease: Secondary | ICD-10-CM | POA: Diagnosis not present

## 2015-09-23 DIAGNOSIS — E1165 Type 2 diabetes mellitus with hyperglycemia: Secondary | ICD-10-CM | POA: Diagnosis not present

## 2015-09-23 DIAGNOSIS — I2510 Atherosclerotic heart disease of native coronary artery without angina pectoris: Secondary | ICD-10-CM | POA: Diagnosis not present

## 2015-09-24 DIAGNOSIS — N186 End stage renal disease: Secondary | ICD-10-CM | POA: Diagnosis not present

## 2015-09-24 DIAGNOSIS — D509 Iron deficiency anemia, unspecified: Secondary | ICD-10-CM | POA: Diagnosis not present

## 2015-09-24 DIAGNOSIS — E119 Type 2 diabetes mellitus without complications: Secondary | ICD-10-CM | POA: Diagnosis not present

## 2015-09-24 DIAGNOSIS — D649 Anemia, unspecified: Secondary | ICD-10-CM | POA: Diagnosis not present

## 2015-09-24 DIAGNOSIS — E8779 Other fluid overload: Secondary | ICD-10-CM | POA: Diagnosis not present

## 2015-09-27 DIAGNOSIS — E119 Type 2 diabetes mellitus without complications: Secondary | ICD-10-CM | POA: Diagnosis not present

## 2015-09-27 DIAGNOSIS — D509 Iron deficiency anemia, unspecified: Secondary | ICD-10-CM | POA: Diagnosis not present

## 2015-09-27 DIAGNOSIS — N186 End stage renal disease: Secondary | ICD-10-CM | POA: Diagnosis not present

## 2015-09-27 DIAGNOSIS — E8779 Other fluid overload: Secondary | ICD-10-CM | POA: Diagnosis not present

## 2015-09-27 DIAGNOSIS — D649 Anemia, unspecified: Secondary | ICD-10-CM | POA: Diagnosis not present

## 2015-09-28 ENCOUNTER — Other Ambulatory Visit: Payer: Self-pay | Admitting: Cardiology

## 2015-09-28 NOTE — Telephone Encounter (Signed)
Rx request sent to pharmacy.  

## 2015-09-29 DIAGNOSIS — E119 Type 2 diabetes mellitus without complications: Secondary | ICD-10-CM | POA: Diagnosis not present

## 2015-09-29 DIAGNOSIS — D509 Iron deficiency anemia, unspecified: Secondary | ICD-10-CM | POA: Diagnosis not present

## 2015-09-29 DIAGNOSIS — D649 Anemia, unspecified: Secondary | ICD-10-CM | POA: Diagnosis not present

## 2015-09-29 DIAGNOSIS — N186 End stage renal disease: Secondary | ICD-10-CM | POA: Diagnosis not present

## 2015-09-29 DIAGNOSIS — E8779 Other fluid overload: Secondary | ICD-10-CM | POA: Diagnosis not present

## 2015-09-30 ENCOUNTER — Encounter: Payer: Self-pay | Admitting: Cardiovascular Disease

## 2015-09-30 ENCOUNTER — Ambulatory Visit (INDEPENDENT_AMBULATORY_CARE_PROVIDER_SITE_OTHER): Payer: Medicare Other | Admitting: Cardiovascular Disease

## 2015-09-30 VITALS — BP 122/64 | HR 92 | Ht 64.0 in | Wt 272.8 lb

## 2015-09-30 DIAGNOSIS — I050 Rheumatic mitral stenosis: Secondary | ICD-10-CM | POA: Diagnosis not present

## 2015-09-30 DIAGNOSIS — I953 Hypotension of hemodialysis: Secondary | ICD-10-CM

## 2015-09-30 DIAGNOSIS — N8502 Endometrial intraepithelial neoplasia [EIN]: Secondary | ICD-10-CM

## 2015-09-30 DIAGNOSIS — I2510 Atherosclerotic heart disease of native coronary artery without angina pectoris: Secondary | ICD-10-CM

## 2015-09-30 DIAGNOSIS — J9611 Chronic respiratory failure with hypoxia: Secondary | ICD-10-CM

## 2015-09-30 DIAGNOSIS — I5032 Chronic diastolic (congestive) heart failure: Secondary | ICD-10-CM | POA: Diagnosis not present

## 2015-09-30 DIAGNOSIS — I272 Other secondary pulmonary hypertension: Secondary | ICD-10-CM

## 2015-09-30 DIAGNOSIS — J449 Chronic obstructive pulmonary disease, unspecified: Secondary | ICD-10-CM

## 2015-09-30 DIAGNOSIS — I2721 Secondary pulmonary arterial hypertension: Secondary | ICD-10-CM

## 2015-09-30 DIAGNOSIS — I480 Paroxysmal atrial fibrillation: Secondary | ICD-10-CM | POA: Diagnosis not present

## 2015-09-30 DIAGNOSIS — N186 End stage renal disease: Secondary | ICD-10-CM

## 2015-09-30 DIAGNOSIS — E662 Morbid (severe) obesity with alveolar hypoventilation: Secondary | ICD-10-CM

## 2015-09-30 DIAGNOSIS — I447 Left bundle-branch block, unspecified: Secondary | ICD-10-CM | POA: Insufficient documentation

## 2015-09-30 DIAGNOSIS — I959 Hypotension, unspecified: Secondary | ICD-10-CM | POA: Insufficient documentation

## 2015-09-30 NOTE — Patient Instructions (Signed)
Your physician has recommended you make the following change in your medication: TAKE YOUR MIDODRINE ONLY ON DIALYSIS DAYS.  Dr. Royann Shivers recommends that you schedule a follow-up appointment in: 3 MONTHS

## 2015-09-30 NOTE — Progress Notes (Signed)
Patient ID: Kimberly [name], female   DOB: 10/10/1955, 60 y.o.   MRN: [id number]    Cardiology Office Note    Date:  09/30/2015   ID:  Kimberly [name], DOB Feb 13, 1956, MRN [id number]  PCP:  [name], MD  Cardiologist:   Sanda Klein, MD  Chief Complaint  Patient presents with  . Follow-up    patient reports occasional chest tightness and flutters when her bp gets really low at dialysis    History of Present Illness:  AKI Kimberly [name] is a 60 y.o. female with history of recurrent paroxysmal atrial fibrillation and previous stroke (CHADSVasc score 6), recently suffering a cardiopulmonary arrest during sedation for AV fistula revision, here for a follow-up visit. She has had intermittent brief self-limited palpitations since then. She has not had sustained arrhythmia that she is aware of. She continues to have occasional problems with severe hypotension during dialysis, especially in attempts are made to challenge her "dry weight".  She has been taking ProAmatine to avoid hemodialysis associated hypotension. She takes the dose when she wakes up in the morning at 8:00 and has dialysis around noon. She takes a second dose after dialysis. She frequently receives fluid dosing of dialysis due to hypotension and cramping.  She has recently received a progesterone releasing intrauterine device in attempts to stop dysfunctional uterine bleeding. She continues to have occasional vaginal bleeding. Her gynecologist had told her that it is likely this will gradually resolve over the next 2-3 months. She also describes rectal bleeding in a pattern strongly suggestive of hemorrhoidal bleeding. She states that she has had more problems with constipation after she was switched to a renal diet. Previously she had managed to maintain regular bowel movements with prunes and spinach, but has had to curtail the intake of these foods.  She has a history of morbid obesity, chronic diastolic heart failure,  mild-moderate mitral stenosis, carotid artery disease with previous left carotid endarterectomy and moderate bilateral stenoses, diabetes mellitus, coronary artery disease with a 70% stenosis in the distal LAD and 70% stenosis in a very small proximal OM, pulmonary hypertension with mixed etiology (left heart failure + hypoventilation related pulmonary hypertension). She has started dialysis within the last year and is still using a tunneled subclavian catheter, since she has had a couple of failed attempts at placement of a permanent AV fistula. I first met her in October 2016 when she had respiratory arrest followed by cardiac arrest immediately after administration of sedation for AV fistula. She is virtually anuric. Her estimated dry weight is 250 pounds a year ago, difficulty reaching this recently. Previously required intubation for hypercarbic respiratory failure.  December 2015 right heart catheterization showed PA pressure 90/33 (mean 53), mean wedge pressure 31 mmHg.  Past Medical History  Diagnosis Date  . HYPERLIPIDEMIA   . HYPERTENSION   . CAD, NATIVE VESSEL     May 10, 2010 cath showed a hyperdynamic LV function, she had dominant circumflex anatomy with a 70-80% small OM1. She had diffuse diabetic plaque particularly in the distal LAD. She nondominant RCA.  Nondominant  . PVD     CEA  . COPD   . CHF (congestive heart failure) (HCC)     Preserved EF  . Cellulitis 10/15/2013  . Diabetic neuropathy ([place])   . Type II diabetes mellitus ([place])   . Proliferative retinopathy     hx/notes 01/27/2010  . Peripheral neuropathy ([place])     hx/notes 01/27/2010  . Asthma   . On  home oxygen therapy     "2.5L; 24/7" (04/06/2015)  . Pneumonia "several times"  . Chronic bronchitis ([place])     "often; usually q yr" (04/06/2015)  . Hypothyroidism   . History of hiatal hernia   . GERD (gastroesophageal reflux disease)   . Stroke [facility])     "on the table when I had my last carotid OR; swallowing  disorder & partial paralyzed on right side since; balance issues too" (04/06/2015)  . Arthritis     "aches and pains all over" (04/06/2015)  . Anxiety   . Depression   . Paralyzed vocal cords   . Sleep apnea     not on cpap    . Anemia   . End stage renal disease ([place])     dialysis M-W-F  . Endometrial hyperplasia     Past Surgical History  Procedure Laterality Date  . Carotid endarterectomy Left X 2  . Vitrectomy Bilateral   . Cataract extraction w/ intraocular lens  implant, bilateral Bilateral   . Eye surgery      numerous surgeries  . Colonoscopy    . Insertion of dialysis catheter N/A 06/24/2015    Procedure: ULTRASOUND BILATERAL INTERNAL JUGULAR VEIN INSERTION OF DIALYSIS CATHETER LEFT INTERNAL JUGULAR VEIN ;  Surgeon: Mal Misty, MD;  Location: [place];  Service: Vascular;  Laterality: N/A;  . Av fistula placement Right 07/08/2015    Procedure: EXPLORATION RIGHT AXILLARY ARTERY AND RIGHT BRACHIAL VEIN;  Surgeon: Angelia Mould, MD;  Location: [place];  Service: Vascular;  Laterality: Right;    Outpatient Prescriptions Prior to Visit  Medication Sig Dispense Refill  . acetaminophen (TYLENOL) 325 MG tablet Take 650 mg by mouth every 6 (six) hours as needed for mild pain.    Marland Kitchen AMOXICILLIN PO Take by mouth 2 (two) times daily. Liquid, for ten days for abscess    . aspirin EC 81 MG tablet Take 81 mg by mouth daily.    Marland Kitchen docusate sodium (COLACE) 100 MG capsule Take 100 mg by mouth daily as needed for mild constipation.    . hydrocerin (EUCERIN) CREA Apply 1 application topically 2 (two) times daily. 113 g 0  . insulin aspart (NOVOLOG) 100 UNIT/ML injection Inject 6 Units into the skin 3 (three) times daily with meals.    . insulin glargine (LANTUS) 100 UNIT/ML injection Inject 50 Units into the skin 2 (two) times daily.     Marland Kitchen levonorgestrel (MIRENA) 20 MCG/24HR IUD 1 Intra Uterine Device (1 each total) by Intrauterine route once. 1 each 0  . levothyroxine (SYNTHROID,  LEVOTHROID) 75 MCG tablet Take 75 mcg by mouth daily before breakfast.    . metoprolol succinate (TOPROL-XL) 50 MG 24 hr tablet TAKE 1 AND 1/2 TABLETS BY MOUTH EVERY DAY 135 tablet 1  . midodrine (PROAMATINE) 10 MG tablet Take 1 tablet (10 mg total) by mouth 2 (two) times daily with a meal. 60 tablet 0  . OXYGEN 2lpm 24/7- [facility]    . KLOR-CON M20 20 MEQ tablet TAKE 2 TABLETS BY MOUTH TWICE A DAY (Patient not taking: Reported on 09/30/2015) 360 tablet 1  . metoprolol tartrate (LOPRESSOR) 25 MG tablet Take 0.5 tablets (12.5 mg total) by mouth 2 (two) times daily. (Patient not taking: Reported on 09/30/2015) 60 tablet 0   No facility-administered medications prior to visit.     Allergies:   Ciprofloxacin and Epinephrine   Social History   Social History  . Marital Status: Married  Spouse Name: N/A  . Number of Children: 0  . Years of Education: N/A   Occupational History  . disabled    Social History Main Topics  . Smoking status: Former Smoker -- 1.00 packs/day for 20 years    Types: Cigarettes    Quit date: 09/11/2005  . Smokeless tobacco: Never Used  . Alcohol Use: No  . Drug Use: No  . Sexual Activity: No   Other Topics Concern  . None   Social History Narrative     Family History:  The patient's family history includes Asthma in her father; CAD (age of onset: 12) in her father; Cancer in her mother; Heart attack in her father; Heart disease in her father; Hypertension in her father; Peripheral vascular disease in her father; Stroke in her mother.   ROS:   Please see the history of present illness.    ROS All other systems reviewed and are negative.   PHYSICAL EXAM:   VS:  BP 122/64 mmHg  Pulse 92  Ht 5' 4"  (1.626 m)  Wt 272 lb 12.8 oz (123.741 kg)  BMI 46.80 kg/m2   GEN: Well nourished, well developed, in no acute distress HEENT: normal Neck: no JVD, carotid bruits, or masses Cardiac: RRR; no murmurs, rubs, or gallops,2+ brawny calf edema  Respiratory:  clear  to auscultation bilaterally, normal work of breathing, tunneled left subclavian catheter GI: soft, nontender, nondistended, + BS MS: no deformity or atrophy Skin: warm and dry, no rash Neuro:  Alert and Oriented x 3, Strength and sensation are intact Psych: euthymic mood, full affect  Wt Readings from Last 3 Encounters:  09/30/15 272 lb 12.8 oz (123.741 kg)  09/02/15 271 lb (122.925 kg)  08/16/15 271 lb 9.6 oz (123.197 kg)      Studies/Labs Reviewed:   EKG:  EKG is ordered today.  The ekg ordered today demonstrates normal sinus rhythm, left bundle branch block  Recent Labs: 04/08/2015: B Natriuretic Peptide 127.0* 07/09/2015: ALT 35 07/10/2015: Magnesium 1.9 07/19/2015: BUN 31*; Creatinine, Ser 5.01*; Potassium 3.8; Sodium 129* 07/20/2015: Hemoglobin 10.9*; Platelets 146*   Lipid Panel No results found for: CHOL, TRIG, HDL, CHOLHDL, VLDL, LDLCALC, LDLDIRECT    ASSESSMENT:    1. PAF (paroxysmal atrial fibrillation) ([place])   2. Chronic diastolic CHF (congestive heart failure) ([place])   3. Pulmonary arterial hypertension ([place])   4. Mitral valve stenosis, moderate   5. Coronary artery disease involving native coronary artery of native heart without angina pectoris   6. Chronic respiratory failure with hypoxia (HCC)   7. Chronic obstructive pulmonary disease, unspecified COPD type ([place])   8. Obesity hypoventilation syndrome ([place])   9. Morbid obesity due to excess calories ([place])   10. Complex endometrial hyperplasia with atypia   11. ESRD (end stage renal disease) ([place])   12. Hemodialysis-associated hypotension      PLAN:  In order of problems listed above:  1. PAF: With a previous history of stroke and a CHADSVasc embolic risk score of 6, she needs to be back on anticoagulation. Unfortunately she continues to have both uterine bleeding and rectal bleeding. The uterine bleeding seems to be the bigger problem. Will delay starting anticoagulation until this settles down, hopefully  within the next 2-3 months. Also discussed fiber supplements, stool softeners and anti-inflammatory suppositories to help treat hemorrhoids and reduce the likelihood of hematochezia. 2. CHF: Heart failure volume management has been easier with dialysis, although she is not close to her previously estimated dry weight and  still has edema. She is essentially anuric, diuretics would be of no benefit. 3. PAH: He was previously stated that her pulmonary hypertension is primarily due to left heart failure, but she has a substantial transpulmonary gradient (greater than 20 mmHg). Therefore it is very likely that hypoventilation disorders (COPD, obesity related restrictive lung disease and chronic hypoventilation) are playing at least that as bigger role as left heart failure. 4. Mitral stenosis: Important to avoid tachycardia, especially atrial fibrillation. Continue beta blocker not withstanding her problems with hypotension 5. CAD: At this point it involves only secondary branches and she does not have angina. Revascularization is not indicated. 6. Chronic hypoxia: Multifactorial, primarily related to obesity and COPD. On home O2. 7. COPD: No longer smoking 8. Obesity hypoventilation: Weight loss would be highly beneficial 9. BMI >>40 10. Uterine bleeding hopefully will abate in the next few months and we can restart anticoagulation 11. ESRD/HD limits the choices of anticoagulants. Warfarin is preferred. Plan to resume it next office appointment if bleeding has subsided. 12. Hypotension I have advised that she take Midodrine about 30 minutes before initiating hemodialysis, since the medication likely wears off by the time she has dialysis on her current schedule. I don't think she should take the vasoconstrictor on nondialysis days.   Medication Adjustments/Labs and Tests Ordered: Current medicines are reviewed at length with the patient today.  Concerns regarding medicines are outlined above.  Medication  changes, Labs and Tests ordered today are listed in the Patient Instructions below. Patient Instructions  Your physician has recommended you make the following change in your medication: TAKE YOUR MIDODRINE ONLY ON DIALYSIS DAYS.  Dr. Sallyanne Kuster recommends that you schedule a follow-up appointment in: [place], Arlis Yale, MD  09/30/2015 1:36 PM    [facility] [place], [place],   [address] Phone: [phone]; Fax: [phone]

## 2015-10-01 DIAGNOSIS — E119 Type 2 diabetes mellitus without complications: Secondary | ICD-10-CM | POA: Diagnosis not present

## 2015-10-01 DIAGNOSIS — E8779 Other fluid overload: Secondary | ICD-10-CM | POA: Diagnosis not present

## 2015-10-01 DIAGNOSIS — D649 Anemia, unspecified: Secondary | ICD-10-CM | POA: Diagnosis not present

## 2015-10-01 DIAGNOSIS — N186 End stage renal disease: Secondary | ICD-10-CM | POA: Diagnosis not present

## 2015-10-01 DIAGNOSIS — D509 Iron deficiency anemia, unspecified: Secondary | ICD-10-CM | POA: Diagnosis not present

## 2015-10-04 DIAGNOSIS — D509 Iron deficiency anemia, unspecified: Secondary | ICD-10-CM | POA: Diagnosis not present

## 2015-10-04 DIAGNOSIS — E8779 Other fluid overload: Secondary | ICD-10-CM | POA: Diagnosis not present

## 2015-10-04 DIAGNOSIS — N186 End stage renal disease: Secondary | ICD-10-CM | POA: Diagnosis not present

## 2015-10-04 DIAGNOSIS — D649 Anemia, unspecified: Secondary | ICD-10-CM | POA: Diagnosis not present

## 2015-10-04 DIAGNOSIS — E119 Type 2 diabetes mellitus without complications: Secondary | ICD-10-CM | POA: Diagnosis not present

## 2015-10-05 DIAGNOSIS — N186 End stage renal disease: Secondary | ICD-10-CM | POA: Diagnosis not present

## 2015-10-05 DIAGNOSIS — I480 Paroxysmal atrial fibrillation: Secondary | ICD-10-CM | POA: Diagnosis not present

## 2015-10-05 DIAGNOSIS — E1122 Type 2 diabetes mellitus with diabetic chronic kidney disease: Secondary | ICD-10-CM | POA: Diagnosis not present

## 2015-10-05 DIAGNOSIS — E1165 Type 2 diabetes mellitus with hyperglycemia: Secondary | ICD-10-CM | POA: Diagnosis not present

## 2015-10-05 DIAGNOSIS — D509 Iron deficiency anemia, unspecified: Secondary | ICD-10-CM | POA: Diagnosis not present

## 2015-10-05 DIAGNOSIS — I5033 Acute on chronic diastolic (congestive) heart failure: Secondary | ICD-10-CM | POA: Diagnosis not present

## 2015-10-05 DIAGNOSIS — E119 Type 2 diabetes mellitus without complications: Secondary | ICD-10-CM | POA: Diagnosis not present

## 2015-10-05 DIAGNOSIS — D649 Anemia, unspecified: Secondary | ICD-10-CM | POA: Diagnosis not present

## 2015-10-05 DIAGNOSIS — I2510 Atherosclerotic heart disease of native coronary artery without angina pectoris: Secondary | ICD-10-CM | POA: Diagnosis not present

## 2015-10-05 DIAGNOSIS — E8779 Other fluid overload: Secondary | ICD-10-CM | POA: Diagnosis not present

## 2015-10-06 DIAGNOSIS — E8779 Other fluid overload: Secondary | ICD-10-CM | POA: Diagnosis not present

## 2015-10-06 DIAGNOSIS — E119 Type 2 diabetes mellitus without complications: Secondary | ICD-10-CM | POA: Diagnosis not present

## 2015-10-06 DIAGNOSIS — N186 End stage renal disease: Secondary | ICD-10-CM | POA: Diagnosis not present

## 2015-10-06 DIAGNOSIS — D509 Iron deficiency anemia, unspecified: Secondary | ICD-10-CM | POA: Diagnosis not present

## 2015-10-06 DIAGNOSIS — D649 Anemia, unspecified: Secondary | ICD-10-CM | POA: Diagnosis not present

## 2015-10-07 DIAGNOSIS — E1165 Type 2 diabetes mellitus with hyperglycemia: Secondary | ICD-10-CM | POA: Diagnosis not present

## 2015-10-07 DIAGNOSIS — E1122 Type 2 diabetes mellitus with diabetic chronic kidney disease: Secondary | ICD-10-CM | POA: Diagnosis not present

## 2015-10-07 DIAGNOSIS — I480 Paroxysmal atrial fibrillation: Secondary | ICD-10-CM | POA: Diagnosis not present

## 2015-10-07 DIAGNOSIS — I2510 Atherosclerotic heart disease of native coronary artery without angina pectoris: Secondary | ICD-10-CM | POA: Diagnosis not present

## 2015-10-07 DIAGNOSIS — N186 End stage renal disease: Secondary | ICD-10-CM | POA: Diagnosis not present

## 2015-10-07 DIAGNOSIS — I5033 Acute on chronic diastolic (congestive) heart failure: Secondary | ICD-10-CM | POA: Diagnosis not present

## 2015-10-08 DIAGNOSIS — D649 Anemia, unspecified: Secondary | ICD-10-CM | POA: Diagnosis not present

## 2015-10-08 DIAGNOSIS — E8779 Other fluid overload: Secondary | ICD-10-CM | POA: Diagnosis not present

## 2015-10-08 DIAGNOSIS — D509 Iron deficiency anemia, unspecified: Secondary | ICD-10-CM | POA: Diagnosis not present

## 2015-10-08 DIAGNOSIS — N186 End stage renal disease: Secondary | ICD-10-CM | POA: Diagnosis not present

## 2015-10-08 DIAGNOSIS — E119 Type 2 diabetes mellitus without complications: Secondary | ICD-10-CM | POA: Diagnosis not present

## 2015-10-11 DIAGNOSIS — E119 Type 2 diabetes mellitus without complications: Secondary | ICD-10-CM | POA: Diagnosis not present

## 2015-10-11 DIAGNOSIS — D509 Iron deficiency anemia, unspecified: Secondary | ICD-10-CM | POA: Diagnosis not present

## 2015-10-11 DIAGNOSIS — E8779 Other fluid overload: Secondary | ICD-10-CM | POA: Diagnosis not present

## 2015-10-11 DIAGNOSIS — N186 End stage renal disease: Secondary | ICD-10-CM | POA: Diagnosis not present

## 2015-10-11 DIAGNOSIS — D649 Anemia, unspecified: Secondary | ICD-10-CM | POA: Diagnosis not present

## 2015-10-12 DIAGNOSIS — Z992 Dependence on renal dialysis: Secondary | ICD-10-CM | POA: Diagnosis not present

## 2015-10-12 DIAGNOSIS — E1122 Type 2 diabetes mellitus with diabetic chronic kidney disease: Secondary | ICD-10-CM | POA: Diagnosis not present

## 2015-10-12 DIAGNOSIS — E1165 Type 2 diabetes mellitus with hyperglycemia: Secondary | ICD-10-CM | POA: Diagnosis not present

## 2015-10-12 DIAGNOSIS — N186 End stage renal disease: Secondary | ICD-10-CM | POA: Diagnosis not present

## 2015-10-12 DIAGNOSIS — I480 Paroxysmal atrial fibrillation: Secondary | ICD-10-CM | POA: Diagnosis not present

## 2015-10-12 DIAGNOSIS — I129 Hypertensive chronic kidney disease with stage 1 through stage 4 chronic kidney disease, or unspecified chronic kidney disease: Secondary | ICD-10-CM | POA: Diagnosis not present

## 2015-10-12 DIAGNOSIS — I5033 Acute on chronic diastolic (congestive) heart failure: Secondary | ICD-10-CM | POA: Diagnosis not present

## 2015-10-12 DIAGNOSIS — I2510 Atherosclerotic heart disease of native coronary artery without angina pectoris: Secondary | ICD-10-CM | POA: Diagnosis not present

## 2015-10-13 DIAGNOSIS — D631 Anemia in chronic kidney disease: Secondary | ICD-10-CM | POA: Diagnosis not present

## 2015-10-13 DIAGNOSIS — N2581 Secondary hyperparathyroidism of renal origin: Secondary | ICD-10-CM | POA: Diagnosis not present

## 2015-10-13 DIAGNOSIS — E876 Hypokalemia: Secondary | ICD-10-CM | POA: Diagnosis not present

## 2015-10-13 DIAGNOSIS — E119 Type 2 diabetes mellitus without complications: Secondary | ICD-10-CM | POA: Diagnosis not present

## 2015-10-13 DIAGNOSIS — D509 Iron deficiency anemia, unspecified: Secondary | ICD-10-CM | POA: Diagnosis not present

## 2015-10-13 DIAGNOSIS — Z23 Encounter for immunization: Secondary | ICD-10-CM | POA: Diagnosis not present

## 2015-10-13 DIAGNOSIS — N186 End stage renal disease: Secondary | ICD-10-CM | POA: Diagnosis not present

## 2015-10-13 DIAGNOSIS — D649 Anemia, unspecified: Secondary | ICD-10-CM | POA: Diagnosis not present

## 2015-10-14 DIAGNOSIS — I5033 Acute on chronic diastolic (congestive) heart failure: Secondary | ICD-10-CM | POA: Diagnosis not present

## 2015-10-14 DIAGNOSIS — E1122 Type 2 diabetes mellitus with diabetic chronic kidney disease: Secondary | ICD-10-CM | POA: Diagnosis not present

## 2015-10-14 DIAGNOSIS — E1165 Type 2 diabetes mellitus with hyperglycemia: Secondary | ICD-10-CM | POA: Diagnosis not present

## 2015-10-14 DIAGNOSIS — I480 Paroxysmal atrial fibrillation: Secondary | ICD-10-CM | POA: Diagnosis not present

## 2015-10-14 DIAGNOSIS — N186 End stage renal disease: Secondary | ICD-10-CM | POA: Diagnosis not present

## 2015-10-14 DIAGNOSIS — I2510 Atherosclerotic heart disease of native coronary artery without angina pectoris: Secondary | ICD-10-CM | POA: Diagnosis not present

## 2015-10-19 DIAGNOSIS — N186 End stage renal disease: Secondary | ICD-10-CM | POA: Diagnosis not present

## 2015-10-19 DIAGNOSIS — I2510 Atherosclerotic heart disease of native coronary artery without angina pectoris: Secondary | ICD-10-CM | POA: Diagnosis not present

## 2015-10-19 DIAGNOSIS — E1122 Type 2 diabetes mellitus with diabetic chronic kidney disease: Secondary | ICD-10-CM | POA: Diagnosis not present

## 2015-10-19 DIAGNOSIS — I480 Paroxysmal atrial fibrillation: Secondary | ICD-10-CM | POA: Diagnosis not present

## 2015-10-19 DIAGNOSIS — I5033 Acute on chronic diastolic (congestive) heart failure: Secondary | ICD-10-CM | POA: Diagnosis not present

## 2015-10-19 DIAGNOSIS — E1165 Type 2 diabetes mellitus with hyperglycemia: Secondary | ICD-10-CM | POA: Diagnosis not present

## 2015-10-21 DIAGNOSIS — I480 Paroxysmal atrial fibrillation: Secondary | ICD-10-CM | POA: Diagnosis not present

## 2015-10-21 DIAGNOSIS — E1122 Type 2 diabetes mellitus with diabetic chronic kidney disease: Secondary | ICD-10-CM | POA: Diagnosis not present

## 2015-10-21 DIAGNOSIS — N186 End stage renal disease: Secondary | ICD-10-CM | POA: Diagnosis not present

## 2015-10-21 DIAGNOSIS — I5033 Acute on chronic diastolic (congestive) heart failure: Secondary | ICD-10-CM | POA: Diagnosis not present

## 2015-10-21 DIAGNOSIS — I2510 Atherosclerotic heart disease of native coronary artery without angina pectoris: Secondary | ICD-10-CM | POA: Diagnosis not present

## 2015-10-21 DIAGNOSIS — E1165 Type 2 diabetes mellitus with hyperglycemia: Secondary | ICD-10-CM | POA: Diagnosis not present

## 2015-10-26 DIAGNOSIS — N186 End stage renal disease: Secondary | ICD-10-CM | POA: Diagnosis not present

## 2015-10-26 DIAGNOSIS — I5033 Acute on chronic diastolic (congestive) heart failure: Secondary | ICD-10-CM | POA: Diagnosis not present

## 2015-10-26 DIAGNOSIS — I480 Paroxysmal atrial fibrillation: Secondary | ICD-10-CM | POA: Diagnosis not present

## 2015-10-26 DIAGNOSIS — E1122 Type 2 diabetes mellitus with diabetic chronic kidney disease: Secondary | ICD-10-CM | POA: Diagnosis not present

## 2015-10-26 DIAGNOSIS — E1165 Type 2 diabetes mellitus with hyperglycemia: Secondary | ICD-10-CM | POA: Diagnosis not present

## 2015-10-26 DIAGNOSIS — I2510 Atherosclerotic heart disease of native coronary artery without angina pectoris: Secondary | ICD-10-CM | POA: Diagnosis not present

## 2015-10-28 DIAGNOSIS — E1122 Type 2 diabetes mellitus with diabetic chronic kidney disease: Secondary | ICD-10-CM | POA: Diagnosis not present

## 2015-10-28 DIAGNOSIS — I480 Paroxysmal atrial fibrillation: Secondary | ICD-10-CM | POA: Diagnosis not present

## 2015-10-28 DIAGNOSIS — I2510 Atherosclerotic heart disease of native coronary artery without angina pectoris: Secondary | ICD-10-CM | POA: Diagnosis not present

## 2015-10-28 DIAGNOSIS — N186 End stage renal disease: Secondary | ICD-10-CM | POA: Diagnosis not present

## 2015-10-28 DIAGNOSIS — I5033 Acute on chronic diastolic (congestive) heart failure: Secondary | ICD-10-CM | POA: Diagnosis not present

## 2015-10-28 DIAGNOSIS — E1165 Type 2 diabetes mellitus with hyperglycemia: Secondary | ICD-10-CM | POA: Diagnosis not present

## 2015-10-30 DIAGNOSIS — E8770 Fluid overload, unspecified: Secondary | ICD-10-CM | POA: Diagnosis not present

## 2015-10-30 DIAGNOSIS — N186 End stage renal disease: Secondary | ICD-10-CM | POA: Diagnosis not present

## 2015-10-30 DIAGNOSIS — I509 Heart failure, unspecified: Secondary | ICD-10-CM | POA: Diagnosis not present

## 2015-11-02 ENCOUNTER — Other Ambulatory Visit: Payer: Self-pay | Admitting: Nephrology

## 2015-11-02 DIAGNOSIS — K769 Liver disease, unspecified: Secondary | ICD-10-CM

## 2015-11-02 DIAGNOSIS — I2510 Atherosclerotic heart disease of native coronary artery without angina pectoris: Secondary | ICD-10-CM | POA: Diagnosis not present

## 2015-11-02 DIAGNOSIS — R188 Other ascites: Secondary | ICD-10-CM

## 2015-11-02 DIAGNOSIS — N186 End stage renal disease: Secondary | ICD-10-CM | POA: Diagnosis not present

## 2015-11-02 DIAGNOSIS — E1165 Type 2 diabetes mellitus with hyperglycemia: Secondary | ICD-10-CM | POA: Diagnosis not present

## 2015-11-02 DIAGNOSIS — E1122 Type 2 diabetes mellitus with diabetic chronic kidney disease: Secondary | ICD-10-CM | POA: Diagnosis not present

## 2015-11-02 DIAGNOSIS — I480 Paroxysmal atrial fibrillation: Secondary | ICD-10-CM | POA: Diagnosis not present

## 2015-11-02 DIAGNOSIS — I5033 Acute on chronic diastolic (congestive) heart failure: Secondary | ICD-10-CM | POA: Diagnosis not present

## 2015-11-04 DIAGNOSIS — E1122 Type 2 diabetes mellitus with diabetic chronic kidney disease: Secondary | ICD-10-CM | POA: Diagnosis not present

## 2015-11-04 DIAGNOSIS — I5033 Acute on chronic diastolic (congestive) heart failure: Secondary | ICD-10-CM | POA: Diagnosis not present

## 2015-11-04 DIAGNOSIS — I2510 Atherosclerotic heart disease of native coronary artery without angina pectoris: Secondary | ICD-10-CM | POA: Diagnosis not present

## 2015-11-04 DIAGNOSIS — I480 Paroxysmal atrial fibrillation: Secondary | ICD-10-CM | POA: Diagnosis not present

## 2015-11-04 DIAGNOSIS — E1165 Type 2 diabetes mellitus with hyperglycemia: Secondary | ICD-10-CM | POA: Diagnosis not present

## 2015-11-04 DIAGNOSIS — N186 End stage renal disease: Secondary | ICD-10-CM | POA: Diagnosis not present

## 2015-11-09 DIAGNOSIS — E1165 Type 2 diabetes mellitus with hyperglycemia: Secondary | ICD-10-CM | POA: Diagnosis not present

## 2015-11-09 DIAGNOSIS — I129 Hypertensive chronic kidney disease with stage 1 through stage 4 chronic kidney disease, or unspecified chronic kidney disease: Secondary | ICD-10-CM | POA: Diagnosis not present

## 2015-11-09 DIAGNOSIS — Z992 Dependence on renal dialysis: Secondary | ICD-10-CM | POA: Diagnosis not present

## 2015-11-09 DIAGNOSIS — N186 End stage renal disease: Secondary | ICD-10-CM | POA: Diagnosis not present

## 2015-11-09 DIAGNOSIS — I5033 Acute on chronic diastolic (congestive) heart failure: Secondary | ICD-10-CM | POA: Diagnosis not present

## 2015-11-09 DIAGNOSIS — I480 Paroxysmal atrial fibrillation: Secondary | ICD-10-CM | POA: Diagnosis not present

## 2015-11-09 DIAGNOSIS — E8770 Fluid overload, unspecified: Secondary | ICD-10-CM | POA: Diagnosis not present

## 2015-11-09 DIAGNOSIS — E1122 Type 2 diabetes mellitus with diabetic chronic kidney disease: Secondary | ICD-10-CM | POA: Diagnosis not present

## 2015-11-09 DIAGNOSIS — I2510 Atherosclerotic heart disease of native coronary artery without angina pectoris: Secondary | ICD-10-CM | POA: Diagnosis not present

## 2015-11-09 DIAGNOSIS — I509 Heart failure, unspecified: Secondary | ICD-10-CM | POA: Diagnosis not present

## 2015-11-10 DIAGNOSIS — N2581 Secondary hyperparathyroidism of renal origin: Secondary | ICD-10-CM | POA: Diagnosis not present

## 2015-11-10 DIAGNOSIS — E119 Type 2 diabetes mellitus without complications: Secondary | ICD-10-CM | POA: Diagnosis not present

## 2015-11-10 DIAGNOSIS — Z23 Encounter for immunization: Secondary | ICD-10-CM | POA: Diagnosis not present

## 2015-11-10 DIAGNOSIS — N186 End stage renal disease: Secondary | ICD-10-CM | POA: Diagnosis not present

## 2015-11-10 DIAGNOSIS — D631 Anemia in chronic kidney disease: Secondary | ICD-10-CM | POA: Diagnosis not present

## 2015-11-10 DIAGNOSIS — D509 Iron deficiency anemia, unspecified: Secondary | ICD-10-CM | POA: Diagnosis not present

## 2015-11-11 ENCOUNTER — Inpatient Hospital Stay: Admission: RE | Admit: 2015-11-11 | Payer: Medicare Other | Source: Ambulatory Visit

## 2015-11-16 DIAGNOSIS — E1165 Type 2 diabetes mellitus with hyperglycemia: Secondary | ICD-10-CM | POA: Diagnosis not present

## 2015-11-16 DIAGNOSIS — I5033 Acute on chronic diastolic (congestive) heart failure: Secondary | ICD-10-CM | POA: Diagnosis not present

## 2015-11-16 DIAGNOSIS — N186 End stage renal disease: Secondary | ICD-10-CM | POA: Diagnosis not present

## 2015-11-16 DIAGNOSIS — E1122 Type 2 diabetes mellitus with diabetic chronic kidney disease: Secondary | ICD-10-CM | POA: Diagnosis not present

## 2015-11-16 DIAGNOSIS — I2510 Atherosclerotic heart disease of native coronary artery without angina pectoris: Secondary | ICD-10-CM | POA: Diagnosis not present

## 2015-11-16 DIAGNOSIS — I480 Paroxysmal atrial fibrillation: Secondary | ICD-10-CM | POA: Diagnosis not present

## 2015-11-18 DIAGNOSIS — I5033 Acute on chronic diastolic (congestive) heart failure: Secondary | ICD-10-CM | POA: Diagnosis not present

## 2015-11-18 DIAGNOSIS — I2510 Atherosclerotic heart disease of native coronary artery without angina pectoris: Secondary | ICD-10-CM | POA: Diagnosis not present

## 2015-11-18 DIAGNOSIS — I480 Paroxysmal atrial fibrillation: Secondary | ICD-10-CM | POA: Diagnosis not present

## 2015-11-18 DIAGNOSIS — E1165 Type 2 diabetes mellitus with hyperglycemia: Secondary | ICD-10-CM | POA: Diagnosis not present

## 2015-11-18 DIAGNOSIS — E1122 Type 2 diabetes mellitus with diabetic chronic kidney disease: Secondary | ICD-10-CM | POA: Diagnosis not present

## 2015-11-18 DIAGNOSIS — N186 End stage renal disease: Secondary | ICD-10-CM | POA: Diagnosis not present

## 2015-11-22 ENCOUNTER — Ambulatory Visit
Admission: RE | Admit: 2015-11-22 | Discharge: 2015-11-22 | Disposition: A | Discharge status: Home / Self Care | Payer: Medicare Other | Source: Ambulatory Visit | Attending: Nephrology | Admitting: Nephrology

## 2015-11-22 DIAGNOSIS — R188 Other ascites: Secondary | ICD-10-CM | POA: Diagnosis not present

## 2015-11-22 DIAGNOSIS — K769 Liver disease, unspecified: Secondary | ICD-10-CM

## 2015-12-07 DIAGNOSIS — E113599 Type 2 diabetes mellitus with proliferative diabetic retinopathy without macular edema, unspecified eye: Secondary | ICD-10-CM | POA: Diagnosis not present

## 2015-12-07 DIAGNOSIS — I272 Other secondary pulmonary hypertension: Secondary | ICD-10-CM | POA: Diagnosis not present

## 2015-12-07 DIAGNOSIS — E7800 Pure hypercholesterolemia, unspecified: Secondary | ICD-10-CM | POA: Diagnosis not present

## 2015-12-07 DIAGNOSIS — R269 Unspecified abnormalities of gait and mobility: Secondary | ICD-10-CM | POA: Diagnosis not present

## 2015-12-07 DIAGNOSIS — E1122 Type 2 diabetes mellitus with diabetic chronic kidney disease: Secondary | ICD-10-CM | POA: Diagnosis not present

## 2015-12-07 DIAGNOSIS — I120 Hypertensive chronic kidney disease with stage 5 chronic kidney disease or end stage renal disease: Secondary | ICD-10-CM | POA: Diagnosis not present

## 2015-12-07 DIAGNOSIS — N186 End stage renal disease: Secondary | ICD-10-CM | POA: Diagnosis not present

## 2015-12-07 DIAGNOSIS — E1142 Type 2 diabetes mellitus with diabetic polyneuropathy: Secondary | ICD-10-CM | POA: Diagnosis not present

## 2015-12-10 DIAGNOSIS — Z992 Dependence on renal dialysis: Secondary | ICD-10-CM | POA: Diagnosis not present

## 2015-12-10 DIAGNOSIS — N186 End stage renal disease: Secondary | ICD-10-CM | POA: Diagnosis not present

## 2015-12-10 DIAGNOSIS — I129 Hypertensive chronic kidney disease with stage 1 through stage 4 chronic kidney disease, or unspecified chronic kidney disease: Secondary | ICD-10-CM | POA: Diagnosis not present

## 2015-12-13 DIAGNOSIS — N186 End stage renal disease: Secondary | ICD-10-CM | POA: Diagnosis not present

## 2015-12-13 DIAGNOSIS — E119 Type 2 diabetes mellitus without complications: Secondary | ICD-10-CM | POA: Diagnosis not present

## 2015-12-13 DIAGNOSIS — N2581 Secondary hyperparathyroidism of renal origin: Secondary | ICD-10-CM | POA: Diagnosis not present

## 2015-12-13 DIAGNOSIS — D631 Anemia in chronic kidney disease: Secondary | ICD-10-CM | POA: Diagnosis not present

## 2015-12-13 DIAGNOSIS — Z23 Encounter for immunization: Secondary | ICD-10-CM | POA: Diagnosis not present

## 2015-12-13 DIAGNOSIS — D509 Iron deficiency anemia, unspecified: Secondary | ICD-10-CM | POA: Diagnosis not present

## 2015-12-16 DIAGNOSIS — E8779 Other fluid overload: Secondary | ICD-10-CM | POA: Diagnosis not present

## 2015-12-16 DIAGNOSIS — N186 End stage renal disease: Secondary | ICD-10-CM | POA: Diagnosis not present

## 2015-12-21 DIAGNOSIS — E8779 Other fluid overload: Secondary | ICD-10-CM | POA: Diagnosis not present

## 2015-12-21 DIAGNOSIS — N186 End stage renal disease: Secondary | ICD-10-CM | POA: Diagnosis not present

## 2016-01-05 DIAGNOSIS — E119 Type 2 diabetes mellitus without complications: Secondary | ICD-10-CM | POA: Diagnosis not present

## 2016-01-06 ENCOUNTER — Ambulatory Visit (INDEPENDENT_AMBULATORY_CARE_PROVIDER_SITE_OTHER): Payer: Medicare Other | Admitting: Cardiovascular Disease

## 2016-01-06 ENCOUNTER — Encounter: Payer: Self-pay | Admitting: Cardiovascular Disease

## 2016-01-06 VITALS — BP 134/70 | HR 101 | Ht 64.0 in | Wt 281.8 lb

## 2016-01-06 DIAGNOSIS — Z794 Long term (current) use of insulin: Secondary | ICD-10-CM

## 2016-01-06 DIAGNOSIS — I2510 Atherosclerotic heart disease of native coronary artery without angina pectoris: Secondary | ICD-10-CM

## 2016-01-06 DIAGNOSIS — E1122 Type 2 diabetes mellitus with diabetic chronic kidney disease: Secondary | ICD-10-CM

## 2016-01-06 DIAGNOSIS — J449 Chronic obstructive pulmonary disease, unspecified: Secondary | ICD-10-CM

## 2016-01-06 DIAGNOSIS — I272 Other secondary pulmonary hypertension: Secondary | ICD-10-CM

## 2016-01-06 DIAGNOSIS — I480 Paroxysmal atrial fibrillation: Secondary | ICD-10-CM | POA: Diagnosis not present

## 2016-01-06 DIAGNOSIS — I5032 Chronic diastolic (congestive) heart failure: Secondary | ICD-10-CM | POA: Diagnosis not present

## 2016-01-06 DIAGNOSIS — Z992 Dependence on renal dialysis: Secondary | ICD-10-CM

## 2016-01-06 DIAGNOSIS — E1165 Type 2 diabetes mellitus with hyperglycemia: Secondary | ICD-10-CM

## 2016-01-06 DIAGNOSIS — E662 Morbid (severe) obesity with alveolar hypoventilation: Secondary | ICD-10-CM

## 2016-01-06 DIAGNOSIS — J9611 Chronic respiratory failure with hypoxia: Secondary | ICD-10-CM

## 2016-01-06 DIAGNOSIS — N186 End stage renal disease: Secondary | ICD-10-CM

## 2016-01-06 DIAGNOSIS — I2721 Secondary pulmonary arterial hypertension: Secondary | ICD-10-CM

## 2016-01-06 DIAGNOSIS — IMO0002 Reserved for concepts with insufficient information to code with codable children: Secondary | ICD-10-CM

## 2016-01-06 NOTE — Patient Instructions (Signed)
Your physician recommends that you schedule a follow-up appointment in FOUR MONTHS with Dr. Royann Shiversroitoru

## 2016-01-06 NOTE — Progress Notes (Signed)
Patient ID: FARA WORTHY, female   DOB: [birth year], 60 y.o.   MRN: [id number]    Cardiology Office Note    Date:  01/06/2016   ID:  CRISELDA STARKE, DOB 1956/06/07, MRN [id number]  PCP:  [name], MD  Cardiologist:   Sanda Klein, MD   Chief Complaint  Patient presents with  . 3 month visit    "quivering" chest pain during dialysis, breathing OK - O2 sats >95% per patient, swelling improved, reports low BP at dialysis so patient stopped toprol xl about 2-3 weeks ago (was on 21m daily)    History of Present Illness:  SMAGAN WINNETTis a 60y.o. female with recent cardiorespiratory arrest, paroxysmal atrial fibrillation, chronic diastolic heart failure, coronary artery disease, obesity hypoventilation syndrome and pulmonary hypertension on chronic oxygen, morbid obesity, diabetes mellitus complicated by end-stage renal disease and neuropathy.  She has not had angina pectoris since her last appointment. She has unpleasant sensation in her chest related to difficulty maintaining patency of her dialysis catheter. Sometimes she requires flushing of the catheter and deep breathing. It sounds like one of the ports may be partially obstructed.  Her beta blocker has been stopped completely due to problems with hypotension that prevented adequate dialysis. She is on ProAmatine. She has not had full blown syncope. He has not had any palpitations since her episode of cardiopulmonary arrest that occurred during attempted AV fistula surgery and was complicated by transient paroxysmal atrial fibrillation. She now has an intrauterine device and has not had any further vaginal bleeding. She has occasional rectal bleeding related to passage of hard stools and has occasional nosebleeds. She has not had any new focal neurological complaints. She has chronic severe neuropathy that affects both her hands and feet.  She has a history of morbid obesity, chronic diastolic heart failure, mild-moderate mitral  stenosis, carotid artery disease with previous left carotid endarterectomy and moderate bilateral stenoses, diabetes mellitus, coronary artery disease with a 70% stenosis in the distal LAD and 70% stenosis in a very small proximal OM, pulmonary hypertension with mixed etiology (left heart failure + hypoventilation related pulmonary hypertension). She has started dialysis within the last year and is still using a tunneled subclavian catheter, since she has had a couple of failed attempts at placement of a permanent AV fistula. I first met her in October 2016 when she had respiratory arrest followed by cardiac arrest immediately after administration of sedation for AV fistula. She is virtually anuric. Her estimated dry weight is 250 pounds a year ago, difficulty reaching this recently. Previously required intubation for hypercarbic respiratory failure. December 2015 right heart catheterization showed PA pressure 90/33 (mean 53), mean wedge pressure 31 mmHg.  Past Medical History  Diagnosis Date  . HYPERLIPIDEMIA   . HYPERTENSION   . CAD, NATIVE VESSEL     May 10, 2010 cath showed a hyperdynamic LV function, she had dominant circumflex anatomy with a 70-80% small OM1. She had diffuse diabetic plaque particularly in the distal LAD. She nondominant RCA.  Nondominant  . PVD     CEA  . COPD   . CHF (congestive heart failure) (HCC)     Preserved EF  . Cellulitis 10/15/2013  . Diabetic neuropathy ([place]   . Type II diabetes mellitus ([place]   . Proliferative retinopathy     hx/notes 01/27/2010  . Peripheral neuropathy ([place]     hx/notes 01/27/2010  . Asthma   . On home oxygen therapy     "2.5L;  24/7" (04/06/2015)  . Pneumonia "several times"  . Chronic bronchitis ([place])     "often; usually q yr" (04/06/2015)  . Hypothyroidism   . History of hiatal hernia   . GERD (gastroesophageal reflux disease)   . 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 Arthritis     "aches and pains all over" (04/06/2015)  . Anxiety   . Depression   . Paralyzed vocal cords   . Sleep apnea     not on cpap    . Anemia   . End stage renal disease ([place])     dialysis M-W-F  . Endometrial hyperplasia     Past Surgical History  Procedure Laterality Date  . Carotid endarterectomy Left X 2  . Vitrectomy Bilateral   . Cataract extraction w/ intraocular lens  implant, bilateral Bilateral   . Eye surgery      numerous surgeries  . Colonoscopy    . Insertion of dialysis catheter N/A 06/24/2015    Procedure: ULTRASOUND BILATERAL INTERNAL JUGULAR VEIN INSERTION OF DIALYSIS CATHETER LEFT INTERNAL JUGULAR VEIN ;  Surgeon: Mal Misty, MD;  Location: [place];  Service: Vascular;  Laterality: N/A;  . Av fistula placement Right 07/08/2015    Procedure: EXPLORATION RIGHT AXILLARY ARTERY AND RIGHT BRACHIAL VEIN;  Surgeon: Angelia Mould, MD;  Location: [facility];  Service: Vascular;  Laterality: Right;    Current Medications: Outpatient Prescriptions Prior to Visit  Medication Sig Dispense Refill  . acetaminophen (TYLENOL) 325 MG tablet Take 650 mg by mouth every 6 (six) hours as needed for mild pain.    Marland Kitchen aspirin EC 81 MG tablet Take 81 mg by mouth daily.    Marland Kitchen docusate sodium (COLACE) 100 MG capsule Take 100 mg by mouth daily as needed for mild constipation.    . hydrocerin (EUCERIN) CREA Apply 1 application topically 2 (two) times daily. 113 g 0  . insulin aspart (NOVOLOG) 100 UNIT/ML injection Inject 6 Units into the skin 3 (three) times daily with meals.    . insulin glargine (LANTUS) 100 UNIT/ML injection Inject 50 Units into the skin 2 (two) times daily.     Marland Kitchen levonorgestrel (MIRENA) 20 MCG/24HR IUD 1 Intra Uterine Device (1 each total) by Intrauterine route once. 1 each 0  . levothyroxine (SYNTHROID, LEVOTHROID) 75 MCG tablet Take 75 mcg by mouth daily before breakfast.    . midodrine (PROAMATINE)  10 MG tablet Take 1 tablet (10 mg total) by mouth 2 (two) times daily with a meal. 60 tablet 0  . OXYGEN 2lpm 24/7- [facility]    . AMOXICILLIN PO Take by mouth 2 (two) times daily. Liquid, for ten days for abscess    . metoprolol succinate (TOPROL-XL) 50 MG 24 hr tablet TAKE 1 AND 1/2 TABLETS BY MOUTH EVERY DAY 135 tablet 1   No facility-administered medications prior to visit.     Allergies:   Ciprofloxacin and Epinephrine   Social History   Social History  . Marital Status: Married    Spouse Name: N/A  . Number of Children: 0  . Years of Education: N/A   Occupational History  . disabled    Social History Main Topics  . Smoking status: Former Smoker -- 1.00 packs/day for 20 years    Types: Cigarettes    Quit date: 09/11/2005  . Smokeless tobacco: Never Used  .  Alcohol Use: No  . Drug Use: No  . Sexual Activity: No   Other Topics Concern  . None   Social History Narrative     Family History:  The patient's family history includes Asthma in her father; CAD (age of onset: 24) in her father; Cancer in her mother; Heart attack in her father; Heart disease in her father; Hypertension in her father; Peripheral vascular disease in her father; Stroke in her mother.   ROS:   Please see the history of present illness.    ROS All other systems reviewed and are negative.   PHYSICAL EXAM:   VS:  BP 134/70 mmHg  Pulse 101  Ht 5' 4"  (1.626 m)  Wt 127.824 kg (281 lb 12.8 oz)  BMI 48.35 kg/m2   GEN: Morbidly obese, well developed, in no acute distress HEENT: normal Neck: no JVD, carotid bruits, or masses Cardiac: RRR; no murmurs, rubs, or gallops, 2+ chronic brawny tablet edema symmetrically  Respiratory:  clear to auscultation bilaterally, normal work of breathing; left subclavian tunneled dialysis catheter GI: soft, nontender,obese, + BS MS: no deformity or atrophy Skin: warm and dry, no rash Neuro:  Alert and Oriented x 3, Strength and sensation are intact Psych: euthymic mood,  full affect  Wt Readings from Last 3 Encounters:  01/06/16 127.824 kg (281 lb 12.8 oz)  09/30/15 123.741 kg (272 lb 12.8 oz)  09/02/15 122.925 kg (271 lb)      Studies/Labs Reviewed:   EKG:  EKG is not ordered today.    Recent Labs: 04/08/2015: B Natriuretic Peptide 127.0* 07/09/2015: ALT 35 07/10/2015: Magnesium 1.9 07/19/2015: BUN 31*; Creatinine, Ser 5.01*; Potassium 3.8; Sodium 129* 07/20/2015: Hemoglobin 10.9*; Platelets 146*   Lipid Panel No results found for: CHOL, TRIG, HDL, CHOLHDL, VLDL, LDLCALC, LDLDIRECT   ASSESSMENT:    1. Paroxysmal atrial fibrillation (HCC)   2. Chronic diastolic CHF (congestive heart failure) ([place])   3. Coronary artery disease involving native coronary artery of native heart without angina pectoris   4. Chronic obstructive pulmonary disease, unspecified COPD type ([place])   5. Uncontrolled type 2 diabetes mellitus with chronic kidney disease on chronic dialysis, with long-term current use of insulin (HCC)   6. Obesity hypoventilation syndrome ([place])   7. Chronic respiratory failure with hypoxia (HCC)   8. Pulmonary arterial hypertension ([place])   9.      ESRD 10.    Hypotension   PLAN:  In order of problems listed above:  1. AFib: With a previous history of stroke and a CHADSVasc embolic risk score of 6, I have recommended chronic anticoagulation with warfarin. She has thought about it and declines to do so. She is were about bleeding complications. Continue aspirin. Since the uterine bleeding has discontinued and her rectal bleeding appears to be relatively minor, I encouraged her to reconsider. She promises to call if she develops any focal neurological events, even if very transient. Her previous stroke appeared to be a complication during carotid endarterectomy and was not likely atrial fibrillation related. We briefly discussed the option for a watchman device. With her numerous comorbid conditions I'm not sure she would qualify for that 2. CHF:  Volume status appears to be well controlled with dialysis. No overt episodes of heart failure exacerbation. 3. CAD: She does not have angina pectoris. 4. COPD: Has quit smoking, remains on chronic oxygen 5. DM:  poorly controlled (her blood sugar is always around 300, except when she is on dialysis when her blood sugar is  lower) 6. OHS: I suspect this may be the most important reason for her pulmonary hypertension, together with COPD and diastolic heart failure. He is morbidly obese and weight loss of be highly beneficial. 7. O2 dependent respiratory failure 8. PAH: it was previously stated that her pulmonary hypertension is primarily due to left heart failure, but she has a substantial transpulmonary gradient (greater than 20 mmHg). Therefore it is very likely that hypoventilation disorders (COPD, obesity related restrictive lung disease and chronic hypoventilation) are playing at least that as bigger role as left heart failure. 9. ESRD: Am not sure of current estimated dry weight 10. Hypotension: Current dose of ProAmatine seems to be working reasonably well to allow dialysis    Medication Adjustments/Labs and Tests Ordered: Current medicines are reviewed at length with the patient today.  Concerns regarding medicines are outlined above.  Medication changes, Labs and Tests ordered today are listed in the Patient Instructions below. Patient Instructions  Your physician recommends that you schedule a follow-up appointment in [place] with Dr. Sinda Du, MD  01/06/2016 1:58 PM    [facility] [place], [place],   [address] Phone: [phone]; Fax: [phone]

## 2016-01-09 DIAGNOSIS — Z992 Dependence on renal dialysis: Secondary | ICD-10-CM | POA: Diagnosis not present

## 2016-01-09 DIAGNOSIS — N186 End stage renal disease: Secondary | ICD-10-CM | POA: Diagnosis not present

## 2016-01-09 DIAGNOSIS — I129 Hypertensive chronic kidney disease with stage 1 through stage 4 chronic kidney disease, or unspecified chronic kidney disease: Secondary | ICD-10-CM | POA: Diagnosis not present

## 2016-01-10 DIAGNOSIS — E119 Type 2 diabetes mellitus without complications: Secondary | ICD-10-CM | POA: Diagnosis not present

## 2016-01-10 DIAGNOSIS — D631 Anemia in chronic kidney disease: Secondary | ICD-10-CM | POA: Diagnosis not present

## 2016-01-10 DIAGNOSIS — N2581 Secondary hyperparathyroidism of renal origin: Secondary | ICD-10-CM | POA: Diagnosis not present

## 2016-01-10 DIAGNOSIS — N186 End stage renal disease: Secondary | ICD-10-CM | POA: Diagnosis not present

## 2016-01-12 DIAGNOSIS — D631 Anemia in chronic kidney disease: Secondary | ICD-10-CM | POA: Diagnosis not present

## 2016-01-12 DIAGNOSIS — E119 Type 2 diabetes mellitus without complications: Secondary | ICD-10-CM | POA: Diagnosis not present

## 2016-01-12 DIAGNOSIS — N186 End stage renal disease: Secondary | ICD-10-CM | POA: Diagnosis not present

## 2016-01-12 DIAGNOSIS — N2581 Secondary hyperparathyroidism of renal origin: Secondary | ICD-10-CM | POA: Diagnosis not present

## 2016-01-14 DIAGNOSIS — E119 Type 2 diabetes mellitus without complications: Secondary | ICD-10-CM | POA: Diagnosis not present

## 2016-01-14 DIAGNOSIS — D631 Anemia in chronic kidney disease: Secondary | ICD-10-CM | POA: Diagnosis not present

## 2016-01-14 DIAGNOSIS — N186 End stage renal disease: Secondary | ICD-10-CM | POA: Diagnosis not present

## 2016-01-14 DIAGNOSIS — N2581 Secondary hyperparathyroidism of renal origin: Secondary | ICD-10-CM | POA: Diagnosis not present

## 2016-01-17 DIAGNOSIS — N186 End stage renal disease: Secondary | ICD-10-CM | POA: Diagnosis not present

## 2016-01-17 DIAGNOSIS — N2581 Secondary hyperparathyroidism of renal origin: Secondary | ICD-10-CM | POA: Diagnosis not present

## 2016-01-17 DIAGNOSIS — D631 Anemia in chronic kidney disease: Secondary | ICD-10-CM | POA: Diagnosis not present

## 2016-01-17 DIAGNOSIS — E119 Type 2 diabetes mellitus without complications: Secondary | ICD-10-CM | POA: Diagnosis not present

## 2016-01-19 DIAGNOSIS — N186 End stage renal disease: Secondary | ICD-10-CM | POA: Diagnosis not present

## 2016-01-19 DIAGNOSIS — N2581 Secondary hyperparathyroidism of renal origin: Secondary | ICD-10-CM | POA: Diagnosis not present

## 2016-01-19 DIAGNOSIS — D631 Anemia in chronic kidney disease: Secondary | ICD-10-CM | POA: Diagnosis not present

## 2016-01-19 DIAGNOSIS — E119 Type 2 diabetes mellitus without complications: Secondary | ICD-10-CM | POA: Diagnosis not present

## 2016-01-21 DIAGNOSIS — N2581 Secondary hyperparathyroidism of renal origin: Secondary | ICD-10-CM | POA: Diagnosis not present

## 2016-01-21 DIAGNOSIS — E119 Type 2 diabetes mellitus without complications: Secondary | ICD-10-CM | POA: Diagnosis not present

## 2016-01-21 DIAGNOSIS — N186 End stage renal disease: Secondary | ICD-10-CM | POA: Diagnosis not present

## 2016-01-21 DIAGNOSIS — D631 Anemia in chronic kidney disease: Secondary | ICD-10-CM | POA: Diagnosis not present

## 2016-01-24 DIAGNOSIS — N186 End stage renal disease: Secondary | ICD-10-CM | POA: Diagnosis not present

## 2016-01-24 DIAGNOSIS — E119 Type 2 diabetes mellitus without complications: Secondary | ICD-10-CM | POA: Diagnosis not present

## 2016-01-24 DIAGNOSIS — N2581 Secondary hyperparathyroidism of renal origin: Secondary | ICD-10-CM | POA: Diagnosis not present

## 2016-01-24 DIAGNOSIS — D631 Anemia in chronic kidney disease: Secondary | ICD-10-CM | POA: Diagnosis not present

## 2016-01-26 DIAGNOSIS — D631 Anemia in chronic kidney disease: Secondary | ICD-10-CM | POA: Diagnosis not present

## 2016-01-26 DIAGNOSIS — N2581 Secondary hyperparathyroidism of renal origin: Secondary | ICD-10-CM | POA: Diagnosis not present

## 2016-01-26 DIAGNOSIS — E119 Type 2 diabetes mellitus without complications: Secondary | ICD-10-CM | POA: Diagnosis not present

## 2016-01-26 DIAGNOSIS — N186 End stage renal disease: Secondary | ICD-10-CM | POA: Diagnosis not present

## 2016-01-28 DIAGNOSIS — E119 Type 2 diabetes mellitus without complications: Secondary | ICD-10-CM | POA: Diagnosis not present

## 2016-01-28 DIAGNOSIS — N2581 Secondary hyperparathyroidism of renal origin: Secondary | ICD-10-CM | POA: Diagnosis not present

## 2016-01-28 DIAGNOSIS — N186 End stage renal disease: Secondary | ICD-10-CM | POA: Diagnosis not present

## 2016-01-28 DIAGNOSIS — D631 Anemia in chronic kidney disease: Secondary | ICD-10-CM | POA: Diagnosis not present

## 2016-01-31 DIAGNOSIS — D631 Anemia in chronic kidney disease: Secondary | ICD-10-CM | POA: Diagnosis not present

## 2016-01-31 DIAGNOSIS — N186 End stage renal disease: Secondary | ICD-10-CM | POA: Diagnosis not present

## 2016-01-31 DIAGNOSIS — N2581 Secondary hyperparathyroidism of renal origin: Secondary | ICD-10-CM | POA: Diagnosis not present

## 2016-01-31 DIAGNOSIS — E119 Type 2 diabetes mellitus without complications: Secondary | ICD-10-CM | POA: Diagnosis not present

## 2016-02-02 DIAGNOSIS — N186 End stage renal disease: Secondary | ICD-10-CM | POA: Diagnosis not present

## 2016-02-02 DIAGNOSIS — N2581 Secondary hyperparathyroidism of renal origin: Secondary | ICD-10-CM | POA: Diagnosis not present

## 2016-02-02 DIAGNOSIS — D631 Anemia in chronic kidney disease: Secondary | ICD-10-CM | POA: Diagnosis not present

## 2016-02-02 DIAGNOSIS — E119 Type 2 diabetes mellitus without complications: Secondary | ICD-10-CM | POA: Diagnosis not present

## 2016-02-04 DIAGNOSIS — E119 Type 2 diabetes mellitus without complications: Secondary | ICD-10-CM | POA: Diagnosis not present

## 2016-02-04 DIAGNOSIS — N186 End stage renal disease: Secondary | ICD-10-CM | POA: Diagnosis not present

## 2016-02-04 DIAGNOSIS — D631 Anemia in chronic kidney disease: Secondary | ICD-10-CM | POA: Diagnosis not present

## 2016-02-04 DIAGNOSIS — N2581 Secondary hyperparathyroidism of renal origin: Secondary | ICD-10-CM | POA: Diagnosis not present

## 2016-02-07 DIAGNOSIS — D631 Anemia in chronic kidney disease: Secondary | ICD-10-CM | POA: Diagnosis not present

## 2016-02-07 DIAGNOSIS — N186 End stage renal disease: Secondary | ICD-10-CM | POA: Diagnosis not present

## 2016-02-07 DIAGNOSIS — E119 Type 2 diabetes mellitus without complications: Secondary | ICD-10-CM | POA: Diagnosis not present

## 2016-02-07 DIAGNOSIS — N2581 Secondary hyperparathyroidism of renal origin: Secondary | ICD-10-CM | POA: Diagnosis not present

## 2016-02-09 DIAGNOSIS — I129 Hypertensive chronic kidney disease with stage 1 through stage 4 chronic kidney disease, or unspecified chronic kidney disease: Secondary | ICD-10-CM | POA: Diagnosis not present

## 2016-02-09 DIAGNOSIS — Z992 Dependence on renal dialysis: Secondary | ICD-10-CM | POA: Diagnosis not present

## 2016-02-09 DIAGNOSIS — E119 Type 2 diabetes mellitus without complications: Secondary | ICD-10-CM | POA: Diagnosis not present

## 2016-02-09 DIAGNOSIS — D631 Anemia in chronic kidney disease: Secondary | ICD-10-CM | POA: Diagnosis not present

## 2016-02-09 DIAGNOSIS — N186 End stage renal disease: Secondary | ICD-10-CM | POA: Diagnosis not present

## 2016-02-09 DIAGNOSIS — N2581 Secondary hyperparathyroidism of renal origin: Secondary | ICD-10-CM | POA: Diagnosis not present

## 2016-02-10 DIAGNOSIS — E113599 Type 2 diabetes mellitus with proliferative diabetic retinopathy without macular edema, unspecified eye: Secondary | ICD-10-CM | POA: Diagnosis not present

## 2016-02-10 DIAGNOSIS — N184 Chronic kidney disease, stage 4 (severe): Secondary | ICD-10-CM | POA: Diagnosis not present

## 2016-02-10 DIAGNOSIS — N8502 Endometrial intraepithelial neoplasia [EIN]: Secondary | ICD-10-CM | POA: Diagnosis not present

## 2016-02-10 DIAGNOSIS — I272 Other secondary pulmonary hypertension: Secondary | ICD-10-CM | POA: Diagnosis not present

## 2016-02-10 DIAGNOSIS — E1151 Type 2 diabetes mellitus with diabetic peripheral angiopathy without gangrene: Secondary | ICD-10-CM | POA: Diagnosis not present

## 2016-02-10 DIAGNOSIS — Z975 Presence of (intrauterine) contraceptive device: Secondary | ICD-10-CM | POA: Diagnosis not present

## 2016-02-10 DIAGNOSIS — Z794 Long term (current) use of insulin: Secondary | ICD-10-CM | POA: Diagnosis not present

## 2016-02-11 DIAGNOSIS — D689 Coagulation defect, unspecified: Secondary | ICD-10-CM | POA: Diagnosis not present

## 2016-02-11 DIAGNOSIS — N186 End stage renal disease: Secondary | ICD-10-CM | POA: Diagnosis not present

## 2016-02-11 DIAGNOSIS — N2581 Secondary hyperparathyroidism of renal origin: Secondary | ICD-10-CM | POA: Diagnosis not present

## 2016-02-11 DIAGNOSIS — D631 Anemia in chronic kidney disease: Secondary | ICD-10-CM | POA: Diagnosis not present

## 2016-02-11 DIAGNOSIS — E119 Type 2 diabetes mellitus without complications: Secondary | ICD-10-CM | POA: Diagnosis not present

## 2016-03-07 DIAGNOSIS — N186 End stage renal disease: Secondary | ICD-10-CM | POA: Diagnosis not present

## 2016-03-07 DIAGNOSIS — Z992 Dependence on renal dialysis: Secondary | ICD-10-CM | POA: Diagnosis not present

## 2016-03-07 DIAGNOSIS — T82858D Stenosis of vascular prosthetic devices, implants and grafts, subsequent encounter: Secondary | ICD-10-CM | POA: Diagnosis not present

## 2016-03-07 DIAGNOSIS — I871 Compression of vein: Secondary | ICD-10-CM | POA: Diagnosis not present

## 2016-03-10 DIAGNOSIS — Z992 Dependence on renal dialysis: Secondary | ICD-10-CM | POA: Diagnosis not present

## 2016-03-10 DIAGNOSIS — N186 End stage renal disease: Secondary | ICD-10-CM | POA: Diagnosis not present

## 2016-03-10 DIAGNOSIS — I129 Hypertensive chronic kidney disease with stage 1 through stage 4 chronic kidney disease, or unspecified chronic kidney disease: Secondary | ICD-10-CM | POA: Diagnosis not present

## 2016-03-13 DIAGNOSIS — N186 End stage renal disease: Secondary | ICD-10-CM | POA: Diagnosis not present

## 2016-03-13 DIAGNOSIS — N2581 Secondary hyperparathyroidism of renal origin: Secondary | ICD-10-CM | POA: Diagnosis not present

## 2016-03-13 DIAGNOSIS — E119 Type 2 diabetes mellitus without complications: Secondary | ICD-10-CM | POA: Diagnosis not present

## 2016-03-13 DIAGNOSIS — E8779 Other fluid overload: Secondary | ICD-10-CM | POA: Diagnosis not present

## 2016-03-13 DIAGNOSIS — D631 Anemia in chronic kidney disease: Secondary | ICD-10-CM | POA: Diagnosis not present

## 2016-03-21 ENCOUNTER — Encounter (HOSPITAL_BASED_OUTPATIENT_CLINIC_OR_DEPARTMENT_OTHER): Payer: Medicare Other | Attending: Surgery

## 2016-03-21 DIAGNOSIS — L97411 Non-pressure chronic ulcer of right heel and midfoot limited to breakdown of skin: Secondary | ICD-10-CM | POA: Insufficient documentation

## 2016-03-21 DIAGNOSIS — Z86718 Personal history of other venous thrombosis and embolism: Secondary | ICD-10-CM | POA: Diagnosis not present

## 2016-03-21 DIAGNOSIS — Z6841 Body Mass Index (BMI) 40.0 and over, adult: Secondary | ICD-10-CM | POA: Diagnosis not present

## 2016-03-21 DIAGNOSIS — E11621 Type 2 diabetes mellitus with foot ulcer: Secondary | ICD-10-CM | POA: Insufficient documentation

## 2016-03-21 DIAGNOSIS — I509 Heart failure, unspecified: Secondary | ICD-10-CM | POA: Insufficient documentation

## 2016-03-21 DIAGNOSIS — Z8673 Personal history of transient ischemic attack (TIA), and cerebral infarction without residual deficits: Secondary | ICD-10-CM | POA: Diagnosis not present

## 2016-03-21 DIAGNOSIS — E11319 Type 2 diabetes mellitus with unspecified diabetic retinopathy without macular edema: Secondary | ICD-10-CM | POA: Diagnosis not present

## 2016-03-21 DIAGNOSIS — J449 Chronic obstructive pulmonary disease, unspecified: Secondary | ICD-10-CM | POA: Diagnosis not present

## 2016-03-21 DIAGNOSIS — Z9981 Dependence on supplemental oxygen: Secondary | ICD-10-CM | POA: Diagnosis not present

## 2016-03-21 DIAGNOSIS — Z992 Dependence on renal dialysis: Secondary | ICD-10-CM | POA: Insufficient documentation

## 2016-03-21 DIAGNOSIS — I2510 Atherosclerotic heart disease of native coronary artery without angina pectoris: Secondary | ICD-10-CM | POA: Insufficient documentation

## 2016-03-21 DIAGNOSIS — E1140 Type 2 diabetes mellitus with diabetic neuropathy, unspecified: Secondary | ICD-10-CM | POA: Diagnosis not present

## 2016-03-21 DIAGNOSIS — E1122 Type 2 diabetes mellitus with diabetic chronic kidney disease: Secondary | ICD-10-CM | POA: Diagnosis not present

## 2016-03-21 DIAGNOSIS — M1990 Unspecified osteoarthritis, unspecified site: Secondary | ICD-10-CM | POA: Diagnosis not present

## 2016-03-21 DIAGNOSIS — E1142 Type 2 diabetes mellitus with diabetic polyneuropathy: Secondary | ICD-10-CM | POA: Diagnosis not present

## 2016-03-30 DIAGNOSIS — Z8673 Personal history of transient ischemic attack (TIA), and cerebral infarction without residual deficits: Secondary | ICD-10-CM | POA: Diagnosis not present

## 2016-03-30 DIAGNOSIS — E11319 Type 2 diabetes mellitus with unspecified diabetic retinopathy without macular edema: Secondary | ICD-10-CM | POA: Diagnosis not present

## 2016-03-30 DIAGNOSIS — L97411 Non-pressure chronic ulcer of right heel and midfoot limited to breakdown of skin: Secondary | ICD-10-CM | POA: Diagnosis not present

## 2016-03-30 DIAGNOSIS — E1140 Type 2 diabetes mellitus with diabetic neuropathy, unspecified: Secondary | ICD-10-CM | POA: Diagnosis not present

## 2016-03-30 DIAGNOSIS — E11621 Type 2 diabetes mellitus with foot ulcer: Secondary | ICD-10-CM | POA: Diagnosis not present

## 2016-04-05 DIAGNOSIS — E119 Type 2 diabetes mellitus without complications: Secondary | ICD-10-CM | POA: Diagnosis not present

## 2016-04-06 DIAGNOSIS — E11319 Type 2 diabetes mellitus with unspecified diabetic retinopathy without macular edema: Secondary | ICD-10-CM | POA: Diagnosis not present

## 2016-04-06 DIAGNOSIS — E11621 Type 2 diabetes mellitus with foot ulcer: Secondary | ICD-10-CM | POA: Diagnosis not present

## 2016-04-06 DIAGNOSIS — E1140 Type 2 diabetes mellitus with diabetic neuropathy, unspecified: Secondary | ICD-10-CM | POA: Diagnosis not present

## 2016-04-06 DIAGNOSIS — L97411 Non-pressure chronic ulcer of right heel and midfoot limited to breakdown of skin: Secondary | ICD-10-CM | POA: Diagnosis not present

## 2016-04-06 DIAGNOSIS — Z8673 Personal history of transient ischemic attack (TIA), and cerebral infarction without residual deficits: Secondary | ICD-10-CM | POA: Diagnosis not present

## 2016-04-10 DIAGNOSIS — I129 Hypertensive chronic kidney disease with stage 1 through stage 4 chronic kidney disease, or unspecified chronic kidney disease: Secondary | ICD-10-CM | POA: Diagnosis not present

## 2016-04-10 DIAGNOSIS — N186 End stage renal disease: Secondary | ICD-10-CM | POA: Diagnosis not present

## 2016-04-10 DIAGNOSIS — Z992 Dependence on renal dialysis: Secondary | ICD-10-CM | POA: Diagnosis not present

## 2016-04-12 DIAGNOSIS — N186 End stage renal disease: Secondary | ICD-10-CM | POA: Diagnosis not present

## 2016-04-12 DIAGNOSIS — D631 Anemia in chronic kidney disease: Secondary | ICD-10-CM | POA: Diagnosis not present

## 2016-04-12 DIAGNOSIS — E119 Type 2 diabetes mellitus without complications: Secondary | ICD-10-CM | POA: Diagnosis not present

## 2016-04-12 DIAGNOSIS — Z23 Encounter for immunization: Secondary | ICD-10-CM | POA: Diagnosis not present

## 2016-04-12 DIAGNOSIS — D509 Iron deficiency anemia, unspecified: Secondary | ICD-10-CM | POA: Diagnosis not present

## 2016-04-12 DIAGNOSIS — N2581 Secondary hyperparathyroidism of renal origin: Secondary | ICD-10-CM | POA: Diagnosis not present

## 2016-04-13 ENCOUNTER — Encounter (HOSPITAL_BASED_OUTPATIENT_CLINIC_OR_DEPARTMENT_OTHER): Payer: Medicare Other | Attending: Internal Medicine

## 2016-04-13 DIAGNOSIS — I252 Old myocardial infarction: Secondary | ICD-10-CM | POA: Diagnosis not present

## 2016-04-13 DIAGNOSIS — I509 Heart failure, unspecified: Secondary | ICD-10-CM | POA: Insufficient documentation

## 2016-04-13 DIAGNOSIS — N186 End stage renal disease: Secondary | ICD-10-CM | POA: Diagnosis not present

## 2016-04-13 DIAGNOSIS — E1142 Type 2 diabetes mellitus with diabetic polyneuropathy: Secondary | ICD-10-CM | POA: Diagnosis not present

## 2016-04-13 DIAGNOSIS — E11621 Type 2 diabetes mellitus with foot ulcer: Secondary | ICD-10-CM | POA: Insufficient documentation

## 2016-04-13 DIAGNOSIS — I132 Hypertensive heart and chronic kidney disease with heart failure and with stage 5 chronic kidney disease, or end stage renal disease: Secondary | ICD-10-CM | POA: Diagnosis not present

## 2016-04-13 DIAGNOSIS — E1151 Type 2 diabetes mellitus with diabetic peripheral angiopathy without gangrene: Secondary | ICD-10-CM | POA: Insufficient documentation

## 2016-04-13 DIAGNOSIS — Z86718 Personal history of other venous thrombosis and embolism: Secondary | ICD-10-CM | POA: Diagnosis not present

## 2016-04-13 DIAGNOSIS — E1122 Type 2 diabetes mellitus with diabetic chronic kidney disease: Secondary | ICD-10-CM | POA: Insufficient documentation

## 2016-04-13 DIAGNOSIS — I2510 Atherosclerotic heart disease of native coronary artery without angina pectoris: Secondary | ICD-10-CM | POA: Insufficient documentation

## 2016-04-13 DIAGNOSIS — J449 Chronic obstructive pulmonary disease, unspecified: Secondary | ICD-10-CM | POA: Diagnosis not present

## 2016-04-13 DIAGNOSIS — L97411 Non-pressure chronic ulcer of right heel and midfoot limited to breakdown of skin: Secondary | ICD-10-CM | POA: Insufficient documentation

## 2016-04-14 DIAGNOSIS — N2581 Secondary hyperparathyroidism of renal origin: Secondary | ICD-10-CM | POA: Diagnosis not present

## 2016-04-14 DIAGNOSIS — Z23 Encounter for immunization: Secondary | ICD-10-CM | POA: Diagnosis not present

## 2016-04-14 DIAGNOSIS — D631 Anemia in chronic kidney disease: Secondary | ICD-10-CM | POA: Diagnosis not present

## 2016-04-14 DIAGNOSIS — D509 Iron deficiency anemia, unspecified: Secondary | ICD-10-CM | POA: Diagnosis not present

## 2016-04-14 DIAGNOSIS — E119 Type 2 diabetes mellitus without complications: Secondary | ICD-10-CM | POA: Diagnosis not present

## 2016-04-14 DIAGNOSIS — N186 End stage renal disease: Secondary | ICD-10-CM | POA: Diagnosis not present

## 2016-04-17 DIAGNOSIS — E119 Type 2 diabetes mellitus without complications: Secondary | ICD-10-CM | POA: Diagnosis not present

## 2016-04-17 DIAGNOSIS — Z23 Encounter for immunization: Secondary | ICD-10-CM | POA: Diagnosis not present

## 2016-04-17 DIAGNOSIS — D631 Anemia in chronic kidney disease: Secondary | ICD-10-CM | POA: Diagnosis not present

## 2016-04-17 DIAGNOSIS — N186 End stage renal disease: Secondary | ICD-10-CM | POA: Diagnosis not present

## 2016-04-17 DIAGNOSIS — D509 Iron deficiency anemia, unspecified: Secondary | ICD-10-CM | POA: Diagnosis not present

## 2016-04-17 DIAGNOSIS — N2581 Secondary hyperparathyroidism of renal origin: Secondary | ICD-10-CM | POA: Diagnosis not present

## 2016-04-19 DIAGNOSIS — D509 Iron deficiency anemia, unspecified: Secondary | ICD-10-CM | POA: Diagnosis not present

## 2016-04-19 DIAGNOSIS — D631 Anemia in chronic kidney disease: Secondary | ICD-10-CM | POA: Diagnosis not present

## 2016-04-19 DIAGNOSIS — N2581 Secondary hyperparathyroidism of renal origin: Secondary | ICD-10-CM | POA: Diagnosis not present

## 2016-04-19 DIAGNOSIS — Z23 Encounter for immunization: Secondary | ICD-10-CM | POA: Diagnosis not present

## 2016-04-19 DIAGNOSIS — N186 End stage renal disease: Secondary | ICD-10-CM | POA: Diagnosis not present

## 2016-04-19 DIAGNOSIS — E119 Type 2 diabetes mellitus without complications: Secondary | ICD-10-CM | POA: Diagnosis not present

## 2016-04-20 DIAGNOSIS — E11621 Type 2 diabetes mellitus with foot ulcer: Secondary | ICD-10-CM | POA: Diagnosis not present

## 2016-04-20 DIAGNOSIS — L97411 Non-pressure chronic ulcer of right heel and midfoot limited to breakdown of skin: Secondary | ICD-10-CM | POA: Diagnosis not present

## 2016-04-21 DIAGNOSIS — E119 Type 2 diabetes mellitus without complications: Secondary | ICD-10-CM | POA: Diagnosis not present

## 2016-04-21 DIAGNOSIS — D631 Anemia in chronic kidney disease: Secondary | ICD-10-CM | POA: Diagnosis not present

## 2016-04-21 DIAGNOSIS — N2581 Secondary hyperparathyroidism of renal origin: Secondary | ICD-10-CM | POA: Diagnosis not present

## 2016-04-21 DIAGNOSIS — N186 End stage renal disease: Secondary | ICD-10-CM | POA: Diagnosis not present

## 2016-04-21 DIAGNOSIS — Z23 Encounter for immunization: Secondary | ICD-10-CM | POA: Diagnosis not present

## 2016-04-21 DIAGNOSIS — D509 Iron deficiency anemia, unspecified: Secondary | ICD-10-CM | POA: Diagnosis not present

## 2016-04-24 DIAGNOSIS — N186 End stage renal disease: Secondary | ICD-10-CM | POA: Diagnosis not present

## 2016-04-24 DIAGNOSIS — E119 Type 2 diabetes mellitus without complications: Secondary | ICD-10-CM | POA: Diagnosis not present

## 2016-04-24 DIAGNOSIS — D631 Anemia in chronic kidney disease: Secondary | ICD-10-CM | POA: Diagnosis not present

## 2016-04-24 DIAGNOSIS — N2581 Secondary hyperparathyroidism of renal origin: Secondary | ICD-10-CM | POA: Diagnosis not present

## 2016-04-24 DIAGNOSIS — D509 Iron deficiency anemia, unspecified: Secondary | ICD-10-CM | POA: Diagnosis not present

## 2016-04-24 DIAGNOSIS — Z23 Encounter for immunization: Secondary | ICD-10-CM | POA: Diagnosis not present

## 2016-04-25 DIAGNOSIS — E662 Morbid (severe) obesity with alveolar hypoventilation: Secondary | ICD-10-CM | POA: Diagnosis not present

## 2016-04-25 DIAGNOSIS — E113599 Type 2 diabetes mellitus with proliferative diabetic retinopathy without macular edema, unspecified eye: Secondary | ICD-10-CM | POA: Diagnosis not present

## 2016-04-25 DIAGNOSIS — E1142 Type 2 diabetes mellitus with diabetic polyneuropathy: Secondary | ICD-10-CM | POA: Diagnosis not present

## 2016-04-25 DIAGNOSIS — Z6841 Body Mass Index (BMI) 40.0 and over, adult: Secondary | ICD-10-CM | POA: Diagnosis not present

## 2016-04-25 DIAGNOSIS — F5101 Primary insomnia: Secondary | ICD-10-CM | POA: Diagnosis not present

## 2016-04-25 DIAGNOSIS — N186 End stage renal disease: Secondary | ICD-10-CM | POA: Diagnosis not present

## 2016-04-25 DIAGNOSIS — I120 Hypertensive chronic kidney disease with stage 5 chronic kidney disease or end stage renal disease: Secondary | ICD-10-CM | POA: Diagnosis not present

## 2016-04-25 DIAGNOSIS — Z794 Long term (current) use of insulin: Secondary | ICD-10-CM | POA: Diagnosis not present

## 2016-04-26 DIAGNOSIS — D509 Iron deficiency anemia, unspecified: Secondary | ICD-10-CM | POA: Diagnosis not present

## 2016-04-26 DIAGNOSIS — D631 Anemia in chronic kidney disease: Secondary | ICD-10-CM | POA: Diagnosis not present

## 2016-04-26 DIAGNOSIS — N186 End stage renal disease: Secondary | ICD-10-CM | POA: Diagnosis not present

## 2016-04-26 DIAGNOSIS — E119 Type 2 diabetes mellitus without complications: Secondary | ICD-10-CM | POA: Diagnosis not present

## 2016-04-26 DIAGNOSIS — Z23 Encounter for immunization: Secondary | ICD-10-CM | POA: Diagnosis not present

## 2016-04-26 DIAGNOSIS — N2581 Secondary hyperparathyroidism of renal origin: Secondary | ICD-10-CM | POA: Diagnosis not present

## 2016-04-27 DIAGNOSIS — E11621 Type 2 diabetes mellitus with foot ulcer: Secondary | ICD-10-CM | POA: Diagnosis not present

## 2016-04-27 DIAGNOSIS — Z09 Encounter for follow-up examination after completed treatment for conditions other than malignant neoplasm: Secondary | ICD-10-CM | POA: Diagnosis not present

## 2016-04-27 DIAGNOSIS — Z8631 Personal history of diabetic foot ulcer: Secondary | ICD-10-CM | POA: Diagnosis not present

## 2016-04-28 DIAGNOSIS — N186 End stage renal disease: Secondary | ICD-10-CM | POA: Diagnosis not present

## 2016-04-28 DIAGNOSIS — E119 Type 2 diabetes mellitus without complications: Secondary | ICD-10-CM | POA: Diagnosis not present

## 2016-04-28 DIAGNOSIS — N2581 Secondary hyperparathyroidism of renal origin: Secondary | ICD-10-CM | POA: Diagnosis not present

## 2016-04-28 DIAGNOSIS — Z23 Encounter for immunization: Secondary | ICD-10-CM | POA: Diagnosis not present

## 2016-04-28 DIAGNOSIS — D509 Iron deficiency anemia, unspecified: Secondary | ICD-10-CM | POA: Diagnosis not present

## 2016-04-28 DIAGNOSIS — D631 Anemia in chronic kidney disease: Secondary | ICD-10-CM | POA: Diagnosis not present

## 2016-05-01 DIAGNOSIS — N186 End stage renal disease: Secondary | ICD-10-CM | POA: Diagnosis not present

## 2016-05-01 DIAGNOSIS — N2581 Secondary hyperparathyroidism of renal origin: Secondary | ICD-10-CM | POA: Diagnosis not present

## 2016-05-01 DIAGNOSIS — D631 Anemia in chronic kidney disease: Secondary | ICD-10-CM | POA: Diagnosis not present

## 2016-05-01 DIAGNOSIS — D509 Iron deficiency anemia, unspecified: Secondary | ICD-10-CM | POA: Diagnosis not present

## 2016-05-01 DIAGNOSIS — Z23 Encounter for immunization: Secondary | ICD-10-CM | POA: Diagnosis not present

## 2016-05-01 DIAGNOSIS — E119 Type 2 diabetes mellitus without complications: Secondary | ICD-10-CM | POA: Diagnosis not present

## 2016-05-03 DIAGNOSIS — N186 End stage renal disease: Secondary | ICD-10-CM | POA: Diagnosis not present

## 2016-05-03 DIAGNOSIS — Z23 Encounter for immunization: Secondary | ICD-10-CM | POA: Diagnosis not present

## 2016-05-03 DIAGNOSIS — N2581 Secondary hyperparathyroidism of renal origin: Secondary | ICD-10-CM | POA: Diagnosis not present

## 2016-05-03 DIAGNOSIS — D509 Iron deficiency anemia, unspecified: Secondary | ICD-10-CM | POA: Diagnosis not present

## 2016-05-03 DIAGNOSIS — D631 Anemia in chronic kidney disease: Secondary | ICD-10-CM | POA: Diagnosis not present

## 2016-05-03 DIAGNOSIS — E119 Type 2 diabetes mellitus without complications: Secondary | ICD-10-CM | POA: Diagnosis not present

## 2016-05-05 DIAGNOSIS — D631 Anemia in chronic kidney disease: Secondary | ICD-10-CM | POA: Diagnosis not present

## 2016-05-05 DIAGNOSIS — N186 End stage renal disease: Secondary | ICD-10-CM | POA: Diagnosis not present

## 2016-05-05 DIAGNOSIS — Z23 Encounter for immunization: Secondary | ICD-10-CM | POA: Diagnosis not present

## 2016-05-05 DIAGNOSIS — N2581 Secondary hyperparathyroidism of renal origin: Secondary | ICD-10-CM | POA: Diagnosis not present

## 2016-05-05 DIAGNOSIS — D509 Iron deficiency anemia, unspecified: Secondary | ICD-10-CM | POA: Diagnosis not present

## 2016-05-05 DIAGNOSIS — E119 Type 2 diabetes mellitus without complications: Secondary | ICD-10-CM | POA: Diagnosis not present

## 2016-05-08 DIAGNOSIS — N186 End stage renal disease: Secondary | ICD-10-CM | POA: Diagnosis not present

## 2016-05-08 DIAGNOSIS — E119 Type 2 diabetes mellitus without complications: Secondary | ICD-10-CM | POA: Diagnosis not present

## 2016-05-08 DIAGNOSIS — Z23 Encounter for immunization: Secondary | ICD-10-CM | POA: Diagnosis not present

## 2016-05-08 DIAGNOSIS — N2581 Secondary hyperparathyroidism of renal origin: Secondary | ICD-10-CM | POA: Diagnosis not present

## 2016-05-08 DIAGNOSIS — D509 Iron deficiency anemia, unspecified: Secondary | ICD-10-CM | POA: Diagnosis not present

## 2016-05-08 DIAGNOSIS — D631 Anemia in chronic kidney disease: Secondary | ICD-10-CM | POA: Diagnosis not present

## 2016-05-10 DIAGNOSIS — N2581 Secondary hyperparathyroidism of renal origin: Secondary | ICD-10-CM | POA: Diagnosis not present

## 2016-05-10 DIAGNOSIS — Z23 Encounter for immunization: Secondary | ICD-10-CM | POA: Diagnosis not present

## 2016-05-10 DIAGNOSIS — N186 End stage renal disease: Secondary | ICD-10-CM | POA: Diagnosis not present

## 2016-05-10 DIAGNOSIS — D509 Iron deficiency anemia, unspecified: Secondary | ICD-10-CM | POA: Diagnosis not present

## 2016-05-10 DIAGNOSIS — E119 Type 2 diabetes mellitus without complications: Secondary | ICD-10-CM | POA: Diagnosis not present

## 2016-05-10 DIAGNOSIS — D631 Anemia in chronic kidney disease: Secondary | ICD-10-CM | POA: Diagnosis not present

## 2016-05-11 DIAGNOSIS — Z992 Dependence on renal dialysis: Secondary | ICD-10-CM | POA: Diagnosis not present

## 2016-05-11 DIAGNOSIS — N186 End stage renal disease: Secondary | ICD-10-CM | POA: Diagnosis not present

## 2016-05-11 DIAGNOSIS — I129 Hypertensive chronic kidney disease with stage 1 through stage 4 chronic kidney disease, or unspecified chronic kidney disease: Secondary | ICD-10-CM | POA: Diagnosis not present

## 2016-05-12 DIAGNOSIS — N186 End stage renal disease: Secondary | ICD-10-CM | POA: Diagnosis not present

## 2016-05-12 DIAGNOSIS — N2581 Secondary hyperparathyroidism of renal origin: Secondary | ICD-10-CM | POA: Diagnosis not present

## 2016-05-12 DIAGNOSIS — E119 Type 2 diabetes mellitus without complications: Secondary | ICD-10-CM | POA: Diagnosis not present

## 2016-05-12 DIAGNOSIS — D509 Iron deficiency anemia, unspecified: Secondary | ICD-10-CM | POA: Diagnosis not present

## 2016-05-12 DIAGNOSIS — D631 Anemia in chronic kidney disease: Secondary | ICD-10-CM | POA: Diagnosis not present

## 2016-05-15 DIAGNOSIS — N186 End stage renal disease: Secondary | ICD-10-CM | POA: Diagnosis not present

## 2016-05-15 DIAGNOSIS — D631 Anemia in chronic kidney disease: Secondary | ICD-10-CM | POA: Diagnosis not present

## 2016-05-15 DIAGNOSIS — E119 Type 2 diabetes mellitus without complications: Secondary | ICD-10-CM | POA: Diagnosis not present

## 2016-05-15 DIAGNOSIS — N2581 Secondary hyperparathyroidism of renal origin: Secondary | ICD-10-CM | POA: Diagnosis not present

## 2016-05-15 DIAGNOSIS — D509 Iron deficiency anemia, unspecified: Secondary | ICD-10-CM | POA: Diagnosis not present

## 2016-05-17 DIAGNOSIS — E119 Type 2 diabetes mellitus without complications: Secondary | ICD-10-CM | POA: Diagnosis not present

## 2016-05-17 DIAGNOSIS — D631 Anemia in chronic kidney disease: Secondary | ICD-10-CM | POA: Diagnosis not present

## 2016-05-17 DIAGNOSIS — D509 Iron deficiency anemia, unspecified: Secondary | ICD-10-CM | POA: Diagnosis not present

## 2016-05-17 DIAGNOSIS — N186 End stage renal disease: Secondary | ICD-10-CM | POA: Diagnosis not present

## 2016-05-17 DIAGNOSIS — N2581 Secondary hyperparathyroidism of renal origin: Secondary | ICD-10-CM | POA: Diagnosis not present

## 2016-05-19 DIAGNOSIS — N186 End stage renal disease: Secondary | ICD-10-CM | POA: Diagnosis not present

## 2016-05-19 DIAGNOSIS — N2581 Secondary hyperparathyroidism of renal origin: Secondary | ICD-10-CM | POA: Diagnosis not present

## 2016-05-19 DIAGNOSIS — D509 Iron deficiency anemia, unspecified: Secondary | ICD-10-CM | POA: Diagnosis not present

## 2016-05-19 DIAGNOSIS — D631 Anemia in chronic kidney disease: Secondary | ICD-10-CM | POA: Diagnosis not present

## 2016-05-19 DIAGNOSIS — E119 Type 2 diabetes mellitus without complications: Secondary | ICD-10-CM | POA: Diagnosis not present

## 2016-05-22 DIAGNOSIS — D631 Anemia in chronic kidney disease: Secondary | ICD-10-CM | POA: Diagnosis not present

## 2016-05-22 DIAGNOSIS — N186 End stage renal disease: Secondary | ICD-10-CM | POA: Diagnosis not present

## 2016-05-22 DIAGNOSIS — N2581 Secondary hyperparathyroidism of renal origin: Secondary | ICD-10-CM | POA: Diagnosis not present

## 2016-05-22 DIAGNOSIS — D509 Iron deficiency anemia, unspecified: Secondary | ICD-10-CM | POA: Diagnosis not present

## 2016-05-22 DIAGNOSIS — E119 Type 2 diabetes mellitus without complications: Secondary | ICD-10-CM | POA: Diagnosis not present

## 2016-05-24 DIAGNOSIS — N2581 Secondary hyperparathyroidism of renal origin: Secondary | ICD-10-CM | POA: Diagnosis not present

## 2016-05-24 DIAGNOSIS — D631 Anemia in chronic kidney disease: Secondary | ICD-10-CM | POA: Diagnosis not present

## 2016-05-24 DIAGNOSIS — D509 Iron deficiency anemia, unspecified: Secondary | ICD-10-CM | POA: Diagnosis not present

## 2016-05-24 DIAGNOSIS — N186 End stage renal disease: Secondary | ICD-10-CM | POA: Diagnosis not present

## 2016-05-24 DIAGNOSIS — E119 Type 2 diabetes mellitus without complications: Secondary | ICD-10-CM | POA: Diagnosis not present

## 2016-05-26 DIAGNOSIS — D509 Iron deficiency anemia, unspecified: Secondary | ICD-10-CM | POA: Diagnosis not present

## 2016-05-26 DIAGNOSIS — N2581 Secondary hyperparathyroidism of renal origin: Secondary | ICD-10-CM | POA: Diagnosis not present

## 2016-05-26 DIAGNOSIS — E119 Type 2 diabetes mellitus without complications: Secondary | ICD-10-CM | POA: Diagnosis not present

## 2016-05-26 DIAGNOSIS — N186 End stage renal disease: Secondary | ICD-10-CM | POA: Diagnosis not present

## 2016-05-26 DIAGNOSIS — D631 Anemia in chronic kidney disease: Secondary | ICD-10-CM | POA: Diagnosis not present

## 2016-05-29 DIAGNOSIS — D631 Anemia in chronic kidney disease: Secondary | ICD-10-CM | POA: Diagnosis not present

## 2016-05-29 DIAGNOSIS — E119 Type 2 diabetes mellitus without complications: Secondary | ICD-10-CM | POA: Diagnosis not present

## 2016-05-29 DIAGNOSIS — N186 End stage renal disease: Secondary | ICD-10-CM | POA: Diagnosis not present

## 2016-05-29 DIAGNOSIS — D509 Iron deficiency anemia, unspecified: Secondary | ICD-10-CM | POA: Diagnosis not present

## 2016-05-29 DIAGNOSIS — N2581 Secondary hyperparathyroidism of renal origin: Secondary | ICD-10-CM | POA: Diagnosis not present

## 2016-05-31 DIAGNOSIS — E119 Type 2 diabetes mellitus without complications: Secondary | ICD-10-CM | POA: Diagnosis not present

## 2016-05-31 DIAGNOSIS — D509 Iron deficiency anemia, unspecified: Secondary | ICD-10-CM | POA: Diagnosis not present

## 2016-05-31 DIAGNOSIS — N2581 Secondary hyperparathyroidism of renal origin: Secondary | ICD-10-CM | POA: Diagnosis not present

## 2016-05-31 DIAGNOSIS — D631 Anemia in chronic kidney disease: Secondary | ICD-10-CM | POA: Diagnosis not present

## 2016-05-31 DIAGNOSIS — N186 End stage renal disease: Secondary | ICD-10-CM | POA: Diagnosis not present

## 2016-06-02 DIAGNOSIS — E119 Type 2 diabetes mellitus without complications: Secondary | ICD-10-CM | POA: Diagnosis not present

## 2016-06-02 DIAGNOSIS — N186 End stage renal disease: Secondary | ICD-10-CM | POA: Diagnosis not present

## 2016-06-02 DIAGNOSIS — D631 Anemia in chronic kidney disease: Secondary | ICD-10-CM | POA: Diagnosis not present

## 2016-06-02 DIAGNOSIS — N2581 Secondary hyperparathyroidism of renal origin: Secondary | ICD-10-CM | POA: Diagnosis not present

## 2016-06-02 DIAGNOSIS — D509 Iron deficiency anemia, unspecified: Secondary | ICD-10-CM | POA: Diagnosis not present

## 2016-06-05 DIAGNOSIS — D631 Anemia in chronic kidney disease: Secondary | ICD-10-CM | POA: Diagnosis not present

## 2016-06-05 DIAGNOSIS — N186 End stage renal disease: Secondary | ICD-10-CM | POA: Diagnosis not present

## 2016-06-05 DIAGNOSIS — D509 Iron deficiency anemia, unspecified: Secondary | ICD-10-CM | POA: Diagnosis not present

## 2016-06-05 DIAGNOSIS — N2581 Secondary hyperparathyroidism of renal origin: Secondary | ICD-10-CM | POA: Diagnosis not present

## 2016-06-05 DIAGNOSIS — E119 Type 2 diabetes mellitus without complications: Secondary | ICD-10-CM | POA: Diagnosis not present

## 2016-06-07 DIAGNOSIS — N2581 Secondary hyperparathyroidism of renal origin: Secondary | ICD-10-CM | POA: Diagnosis not present

## 2016-06-07 DIAGNOSIS — D631 Anemia in chronic kidney disease: Secondary | ICD-10-CM | POA: Diagnosis not present

## 2016-06-07 DIAGNOSIS — D509 Iron deficiency anemia, unspecified: Secondary | ICD-10-CM | POA: Diagnosis not present

## 2016-06-07 DIAGNOSIS — E119 Type 2 diabetes mellitus without complications: Secondary | ICD-10-CM | POA: Diagnosis not present

## 2016-06-07 DIAGNOSIS — N186 End stage renal disease: Secondary | ICD-10-CM | POA: Diagnosis not present

## 2016-06-09 DIAGNOSIS — E119 Type 2 diabetes mellitus without complications: Secondary | ICD-10-CM | POA: Diagnosis not present

## 2016-06-09 DIAGNOSIS — D631 Anemia in chronic kidney disease: Secondary | ICD-10-CM | POA: Diagnosis not present

## 2016-06-09 DIAGNOSIS — N2581 Secondary hyperparathyroidism of renal origin: Secondary | ICD-10-CM | POA: Diagnosis not present

## 2016-06-09 DIAGNOSIS — D509 Iron deficiency anemia, unspecified: Secondary | ICD-10-CM | POA: Diagnosis not present

## 2016-06-09 DIAGNOSIS — N186 End stage renal disease: Secondary | ICD-10-CM | POA: Diagnosis not present

## 2016-06-10 DIAGNOSIS — Z992 Dependence on renal dialysis: Secondary | ICD-10-CM | POA: Diagnosis not present

## 2016-06-10 DIAGNOSIS — I129 Hypertensive chronic kidney disease with stage 1 through stage 4 chronic kidney disease, or unspecified chronic kidney disease: Secondary | ICD-10-CM | POA: Diagnosis not present

## 2016-06-10 DIAGNOSIS — N186 End stage renal disease: Secondary | ICD-10-CM | POA: Diagnosis not present

## 2016-06-12 DIAGNOSIS — D509 Iron deficiency anemia, unspecified: Secondary | ICD-10-CM | POA: Diagnosis not present

## 2016-06-12 DIAGNOSIS — N186 End stage renal disease: Secondary | ICD-10-CM | POA: Diagnosis not present

## 2016-06-12 DIAGNOSIS — Z23 Encounter for immunization: Secondary | ICD-10-CM | POA: Diagnosis not present

## 2016-06-12 DIAGNOSIS — N2581 Secondary hyperparathyroidism of renal origin: Secondary | ICD-10-CM | POA: Diagnosis not present

## 2016-06-12 DIAGNOSIS — D631 Anemia in chronic kidney disease: Secondary | ICD-10-CM | POA: Diagnosis not present

## 2016-06-14 DIAGNOSIS — Z23 Encounter for immunization: Secondary | ICD-10-CM | POA: Diagnosis not present

## 2016-06-14 DIAGNOSIS — D631 Anemia in chronic kidney disease: Secondary | ICD-10-CM | POA: Diagnosis not present

## 2016-06-14 DIAGNOSIS — N2581 Secondary hyperparathyroidism of renal origin: Secondary | ICD-10-CM | POA: Diagnosis not present

## 2016-06-14 DIAGNOSIS — D509 Iron deficiency anemia, unspecified: Secondary | ICD-10-CM | POA: Diagnosis not present

## 2016-06-14 DIAGNOSIS — N186 End stage renal disease: Secondary | ICD-10-CM | POA: Diagnosis not present

## 2016-06-16 DIAGNOSIS — D509 Iron deficiency anemia, unspecified: Secondary | ICD-10-CM | POA: Diagnosis not present

## 2016-06-16 DIAGNOSIS — D631 Anemia in chronic kidney disease: Secondary | ICD-10-CM | POA: Diagnosis not present

## 2016-06-16 DIAGNOSIS — N2581 Secondary hyperparathyroidism of renal origin: Secondary | ICD-10-CM | POA: Diagnosis not present

## 2016-06-16 DIAGNOSIS — N186 End stage renal disease: Secondary | ICD-10-CM | POA: Diagnosis not present

## 2016-06-16 DIAGNOSIS — Z23 Encounter for immunization: Secondary | ICD-10-CM | POA: Diagnosis not present

## 2016-06-19 DIAGNOSIS — N186 End stage renal disease: Secondary | ICD-10-CM | POA: Diagnosis not present

## 2016-06-19 DIAGNOSIS — D509 Iron deficiency anemia, unspecified: Secondary | ICD-10-CM | POA: Diagnosis not present

## 2016-06-19 DIAGNOSIS — N2581 Secondary hyperparathyroidism of renal origin: Secondary | ICD-10-CM | POA: Diagnosis not present

## 2016-06-19 DIAGNOSIS — Z23 Encounter for immunization: Secondary | ICD-10-CM | POA: Diagnosis not present

## 2016-06-19 DIAGNOSIS — D631 Anemia in chronic kidney disease: Secondary | ICD-10-CM | POA: Diagnosis not present

## 2016-06-21 DIAGNOSIS — D631 Anemia in chronic kidney disease: Secondary | ICD-10-CM | POA: Diagnosis not present

## 2016-06-21 DIAGNOSIS — D509 Iron deficiency anemia, unspecified: Secondary | ICD-10-CM | POA: Diagnosis not present

## 2016-06-21 DIAGNOSIS — N186 End stage renal disease: Secondary | ICD-10-CM | POA: Diagnosis not present

## 2016-06-21 DIAGNOSIS — N2581 Secondary hyperparathyroidism of renal origin: Secondary | ICD-10-CM | POA: Diagnosis not present

## 2016-06-21 DIAGNOSIS — Z23 Encounter for immunization: Secondary | ICD-10-CM | POA: Diagnosis not present

## 2016-06-23 DIAGNOSIS — N2581 Secondary hyperparathyroidism of renal origin: Secondary | ICD-10-CM | POA: Diagnosis not present

## 2016-06-23 DIAGNOSIS — Z23 Encounter for immunization: Secondary | ICD-10-CM | POA: Diagnosis not present

## 2016-06-23 DIAGNOSIS — D509 Iron deficiency anemia, unspecified: Secondary | ICD-10-CM | POA: Diagnosis not present

## 2016-06-23 DIAGNOSIS — N186 End stage renal disease: Secondary | ICD-10-CM | POA: Diagnosis not present

## 2016-06-23 DIAGNOSIS — D631 Anemia in chronic kidney disease: Secondary | ICD-10-CM | POA: Diagnosis not present

## 2016-06-26 DIAGNOSIS — D631 Anemia in chronic kidney disease: Secondary | ICD-10-CM | POA: Diagnosis not present

## 2016-06-26 DIAGNOSIS — N2581 Secondary hyperparathyroidism of renal origin: Secondary | ICD-10-CM | POA: Diagnosis not present

## 2016-06-26 DIAGNOSIS — D509 Iron deficiency anemia, unspecified: Secondary | ICD-10-CM | POA: Diagnosis not present

## 2016-06-26 DIAGNOSIS — N186 End stage renal disease: Secondary | ICD-10-CM | POA: Diagnosis not present

## 2016-06-26 DIAGNOSIS — Z23 Encounter for immunization: Secondary | ICD-10-CM | POA: Diagnosis not present

## 2016-06-28 DIAGNOSIS — N2581 Secondary hyperparathyroidism of renal origin: Secondary | ICD-10-CM | POA: Diagnosis not present

## 2016-06-28 DIAGNOSIS — N186 End stage renal disease: Secondary | ICD-10-CM | POA: Diagnosis not present

## 2016-06-28 DIAGNOSIS — D509 Iron deficiency anemia, unspecified: Secondary | ICD-10-CM | POA: Diagnosis not present

## 2016-06-28 DIAGNOSIS — Z23 Encounter for immunization: Secondary | ICD-10-CM | POA: Diagnosis not present

## 2016-06-28 DIAGNOSIS — D631 Anemia in chronic kidney disease: Secondary | ICD-10-CM | POA: Diagnosis not present

## 2016-06-30 DIAGNOSIS — N2581 Secondary hyperparathyroidism of renal origin: Secondary | ICD-10-CM | POA: Diagnosis not present

## 2016-06-30 DIAGNOSIS — N186 End stage renal disease: Secondary | ICD-10-CM | POA: Diagnosis not present

## 2016-06-30 DIAGNOSIS — D509 Iron deficiency anemia, unspecified: Secondary | ICD-10-CM | POA: Diagnosis not present

## 2016-06-30 DIAGNOSIS — D631 Anemia in chronic kidney disease: Secondary | ICD-10-CM | POA: Diagnosis not present

## 2016-06-30 DIAGNOSIS — Z23 Encounter for immunization: Secondary | ICD-10-CM | POA: Diagnosis not present

## 2016-07-03 DIAGNOSIS — N2581 Secondary hyperparathyroidism of renal origin: Secondary | ICD-10-CM | POA: Diagnosis not present

## 2016-07-03 DIAGNOSIS — D631 Anemia in chronic kidney disease: Secondary | ICD-10-CM | POA: Diagnosis not present

## 2016-07-03 DIAGNOSIS — N186 End stage renal disease: Secondary | ICD-10-CM | POA: Diagnosis not present

## 2016-07-03 DIAGNOSIS — D509 Iron deficiency anemia, unspecified: Secondary | ICD-10-CM | POA: Diagnosis not present

## 2016-07-03 DIAGNOSIS — Z23 Encounter for immunization: Secondary | ICD-10-CM | POA: Diagnosis not present

## 2016-07-05 DIAGNOSIS — E119 Type 2 diabetes mellitus without complications: Secondary | ICD-10-CM | POA: Diagnosis not present

## 2016-07-05 DIAGNOSIS — N186 End stage renal disease: Secondary | ICD-10-CM | POA: Diagnosis not present

## 2016-07-05 DIAGNOSIS — N2581 Secondary hyperparathyroidism of renal origin: Secondary | ICD-10-CM | POA: Diagnosis not present

## 2016-07-05 DIAGNOSIS — Z23 Encounter for immunization: Secondary | ICD-10-CM | POA: Diagnosis not present

## 2016-07-05 DIAGNOSIS — D631 Anemia in chronic kidney disease: Secondary | ICD-10-CM | POA: Diagnosis not present

## 2016-07-05 DIAGNOSIS — D509 Iron deficiency anemia, unspecified: Secondary | ICD-10-CM | POA: Diagnosis not present

## 2016-07-07 DIAGNOSIS — D509 Iron deficiency anemia, unspecified: Secondary | ICD-10-CM | POA: Diagnosis not present

## 2016-07-07 DIAGNOSIS — D631 Anemia in chronic kidney disease: Secondary | ICD-10-CM | POA: Diagnosis not present

## 2016-07-07 DIAGNOSIS — N2581 Secondary hyperparathyroidism of renal origin: Secondary | ICD-10-CM | POA: Diagnosis not present

## 2016-07-07 DIAGNOSIS — N186 End stage renal disease: Secondary | ICD-10-CM | POA: Diagnosis not present

## 2016-07-07 DIAGNOSIS — Z23 Encounter for immunization: Secondary | ICD-10-CM | POA: Diagnosis not present

## 2016-07-10 DIAGNOSIS — D509 Iron deficiency anemia, unspecified: Secondary | ICD-10-CM | POA: Diagnosis not present

## 2016-07-10 DIAGNOSIS — N2581 Secondary hyperparathyroidism of renal origin: Secondary | ICD-10-CM | POA: Diagnosis not present

## 2016-07-10 DIAGNOSIS — Z23 Encounter for immunization: Secondary | ICD-10-CM | POA: Diagnosis not present

## 2016-07-10 DIAGNOSIS — N186 End stage renal disease: Secondary | ICD-10-CM | POA: Diagnosis not present

## 2016-07-10 DIAGNOSIS — D631 Anemia in chronic kidney disease: Secondary | ICD-10-CM | POA: Diagnosis not present

## 2016-07-11 DIAGNOSIS — Z992 Dependence on renal dialysis: Secondary | ICD-10-CM | POA: Diagnosis not present

## 2016-07-11 DIAGNOSIS — N186 End stage renal disease: Secondary | ICD-10-CM | POA: Diagnosis not present

## 2016-07-11 DIAGNOSIS — I129 Hypertensive chronic kidney disease with stage 1 through stage 4 chronic kidney disease, or unspecified chronic kidney disease: Secondary | ICD-10-CM | POA: Diagnosis not present

## 2016-07-12 DIAGNOSIS — N2581 Secondary hyperparathyroidism of renal origin: Secondary | ICD-10-CM | POA: Diagnosis not present

## 2016-07-12 DIAGNOSIS — D509 Iron deficiency anemia, unspecified: Secondary | ICD-10-CM | POA: Diagnosis not present

## 2016-07-12 DIAGNOSIS — D631 Anemia in chronic kidney disease: Secondary | ICD-10-CM | POA: Diagnosis not present

## 2016-07-12 DIAGNOSIS — N186 End stage renal disease: Secondary | ICD-10-CM | POA: Diagnosis not present

## 2016-07-12 DIAGNOSIS — E119 Type 2 diabetes mellitus without complications: Secondary | ICD-10-CM | POA: Diagnosis not present

## 2016-07-14 DIAGNOSIS — D631 Anemia in chronic kidney disease: Secondary | ICD-10-CM | POA: Diagnosis not present

## 2016-07-14 DIAGNOSIS — N186 End stage renal disease: Secondary | ICD-10-CM | POA: Diagnosis not present

## 2016-07-14 DIAGNOSIS — N2581 Secondary hyperparathyroidism of renal origin: Secondary | ICD-10-CM | POA: Diagnosis not present

## 2016-07-14 DIAGNOSIS — E119 Type 2 diabetes mellitus without complications: Secondary | ICD-10-CM | POA: Diagnosis not present

## 2016-07-14 DIAGNOSIS — D509 Iron deficiency anemia, unspecified: Secondary | ICD-10-CM | POA: Diagnosis not present

## 2016-07-17 DIAGNOSIS — E119 Type 2 diabetes mellitus without complications: Secondary | ICD-10-CM | POA: Diagnosis not present

## 2016-07-17 DIAGNOSIS — D509 Iron deficiency anemia, unspecified: Secondary | ICD-10-CM | POA: Diagnosis not present

## 2016-07-17 DIAGNOSIS — D631 Anemia in chronic kidney disease: Secondary | ICD-10-CM | POA: Diagnosis not present

## 2016-07-17 DIAGNOSIS — N2581 Secondary hyperparathyroidism of renal origin: Secondary | ICD-10-CM | POA: Diagnosis not present

## 2016-07-17 DIAGNOSIS — N186 End stage renal disease: Secondary | ICD-10-CM | POA: Diagnosis not present

## 2016-07-19 DIAGNOSIS — D509 Iron deficiency anemia, unspecified: Secondary | ICD-10-CM | POA: Diagnosis not present

## 2016-07-19 DIAGNOSIS — D631 Anemia in chronic kidney disease: Secondary | ICD-10-CM | POA: Diagnosis not present

## 2016-07-19 DIAGNOSIS — N186 End stage renal disease: Secondary | ICD-10-CM | POA: Diagnosis not present

## 2016-07-19 DIAGNOSIS — N2581 Secondary hyperparathyroidism of renal origin: Secondary | ICD-10-CM | POA: Diagnosis not present

## 2016-07-19 DIAGNOSIS — E119 Type 2 diabetes mellitus without complications: Secondary | ICD-10-CM | POA: Diagnosis not present

## 2016-07-21 DIAGNOSIS — D631 Anemia in chronic kidney disease: Secondary | ICD-10-CM | POA: Diagnosis not present

## 2016-07-21 DIAGNOSIS — D509 Iron deficiency anemia, unspecified: Secondary | ICD-10-CM | POA: Diagnosis not present

## 2016-07-21 DIAGNOSIS — E119 Type 2 diabetes mellitus without complications: Secondary | ICD-10-CM | POA: Diagnosis not present

## 2016-07-21 DIAGNOSIS — N186 End stage renal disease: Secondary | ICD-10-CM | POA: Diagnosis not present

## 2016-07-21 DIAGNOSIS — N2581 Secondary hyperparathyroidism of renal origin: Secondary | ICD-10-CM | POA: Diagnosis not present

## 2016-07-24 DIAGNOSIS — E119 Type 2 diabetes mellitus without complications: Secondary | ICD-10-CM | POA: Diagnosis not present

## 2016-07-24 DIAGNOSIS — D509 Iron deficiency anemia, unspecified: Secondary | ICD-10-CM | POA: Diagnosis not present

## 2016-07-24 DIAGNOSIS — N186 End stage renal disease: Secondary | ICD-10-CM | POA: Diagnosis not present

## 2016-07-24 DIAGNOSIS — N2581 Secondary hyperparathyroidism of renal origin: Secondary | ICD-10-CM | POA: Diagnosis not present

## 2016-07-24 DIAGNOSIS — D631 Anemia in chronic kidney disease: Secondary | ICD-10-CM | POA: Diagnosis not present

## 2016-07-25 DIAGNOSIS — E8770 Fluid overload, unspecified: Secondary | ICD-10-CM | POA: Diagnosis not present

## 2016-07-25 DIAGNOSIS — N186 End stage renal disease: Secondary | ICD-10-CM | POA: Diagnosis not present

## 2016-07-26 DIAGNOSIS — D631 Anemia in chronic kidney disease: Secondary | ICD-10-CM | POA: Diagnosis not present

## 2016-07-26 DIAGNOSIS — N186 End stage renal disease: Secondary | ICD-10-CM | POA: Diagnosis not present

## 2016-07-26 DIAGNOSIS — N2581 Secondary hyperparathyroidism of renal origin: Secondary | ICD-10-CM | POA: Diagnosis not present

## 2016-07-26 DIAGNOSIS — E119 Type 2 diabetes mellitus without complications: Secondary | ICD-10-CM | POA: Diagnosis not present

## 2016-07-26 DIAGNOSIS — D509 Iron deficiency anemia, unspecified: Secondary | ICD-10-CM | POA: Diagnosis not present

## 2016-07-28 DIAGNOSIS — D631 Anemia in chronic kidney disease: Secondary | ICD-10-CM | POA: Diagnosis not present

## 2016-07-28 DIAGNOSIS — N2581 Secondary hyperparathyroidism of renal origin: Secondary | ICD-10-CM | POA: Diagnosis not present

## 2016-07-28 DIAGNOSIS — E119 Type 2 diabetes mellitus without complications: Secondary | ICD-10-CM | POA: Diagnosis not present

## 2016-07-28 DIAGNOSIS — D509 Iron deficiency anemia, unspecified: Secondary | ICD-10-CM | POA: Diagnosis not present

## 2016-07-28 DIAGNOSIS — N186 End stage renal disease: Secondary | ICD-10-CM | POA: Diagnosis not present

## 2016-07-30 DIAGNOSIS — N186 End stage renal disease: Secondary | ICD-10-CM | POA: Diagnosis not present

## 2016-07-30 DIAGNOSIS — E119 Type 2 diabetes mellitus without complications: Secondary | ICD-10-CM | POA: Diagnosis not present

## 2016-07-30 DIAGNOSIS — N2581 Secondary hyperparathyroidism of renal origin: Secondary | ICD-10-CM | POA: Diagnosis not present

## 2016-07-30 DIAGNOSIS — D509 Iron deficiency anemia, unspecified: Secondary | ICD-10-CM | POA: Diagnosis not present

## 2016-07-30 DIAGNOSIS — D631 Anemia in chronic kidney disease: Secondary | ICD-10-CM | POA: Diagnosis not present

## 2016-08-01 DIAGNOSIS — N186 End stage renal disease: Secondary | ICD-10-CM | POA: Diagnosis not present

## 2016-08-01 DIAGNOSIS — D631 Anemia in chronic kidney disease: Secondary | ICD-10-CM | POA: Diagnosis not present

## 2016-08-01 DIAGNOSIS — D509 Iron deficiency anemia, unspecified: Secondary | ICD-10-CM | POA: Diagnosis not present

## 2016-08-01 DIAGNOSIS — E119 Type 2 diabetes mellitus without complications: Secondary | ICD-10-CM | POA: Diagnosis not present

## 2016-08-01 DIAGNOSIS — N2581 Secondary hyperparathyroidism of renal origin: Secondary | ICD-10-CM | POA: Diagnosis not present

## 2016-08-04 DIAGNOSIS — D631 Anemia in chronic kidney disease: Secondary | ICD-10-CM | POA: Diagnosis not present

## 2016-08-04 DIAGNOSIS — E119 Type 2 diabetes mellitus without complications: Secondary | ICD-10-CM | POA: Diagnosis not present

## 2016-08-04 DIAGNOSIS — N186 End stage renal disease: Secondary | ICD-10-CM | POA: Diagnosis not present

## 2016-08-04 DIAGNOSIS — D509 Iron deficiency anemia, unspecified: Secondary | ICD-10-CM | POA: Diagnosis not present

## 2016-08-04 DIAGNOSIS — N2581 Secondary hyperparathyroidism of renal origin: Secondary | ICD-10-CM | POA: Diagnosis not present

## 2016-08-07 DIAGNOSIS — N2581 Secondary hyperparathyroidism of renal origin: Secondary | ICD-10-CM | POA: Diagnosis not present

## 2016-08-07 DIAGNOSIS — D509 Iron deficiency anemia, unspecified: Secondary | ICD-10-CM | POA: Diagnosis not present

## 2016-08-07 DIAGNOSIS — N186 End stage renal disease: Secondary | ICD-10-CM | POA: Diagnosis not present

## 2016-08-07 DIAGNOSIS — D631 Anemia in chronic kidney disease: Secondary | ICD-10-CM | POA: Diagnosis not present

## 2016-08-07 DIAGNOSIS — E119 Type 2 diabetes mellitus without complications: Secondary | ICD-10-CM | POA: Diagnosis not present

## 2016-08-09 DIAGNOSIS — E119 Type 2 diabetes mellitus without complications: Secondary | ICD-10-CM | POA: Diagnosis not present

## 2016-08-09 DIAGNOSIS — N186 End stage renal disease: Secondary | ICD-10-CM | POA: Diagnosis not present

## 2016-08-09 DIAGNOSIS — N2581 Secondary hyperparathyroidism of renal origin: Secondary | ICD-10-CM | POA: Diagnosis not present

## 2016-08-09 DIAGNOSIS — D509 Iron deficiency anemia, unspecified: Secondary | ICD-10-CM | POA: Diagnosis not present

## 2016-08-09 DIAGNOSIS — D631 Anemia in chronic kidney disease: Secondary | ICD-10-CM | POA: Diagnosis not present

## 2016-08-10 DIAGNOSIS — Z992 Dependence on renal dialysis: Secondary | ICD-10-CM | POA: Diagnosis not present

## 2016-08-10 DIAGNOSIS — I129 Hypertensive chronic kidney disease with stage 1 through stage 4 chronic kidney disease, or unspecified chronic kidney disease: Secondary | ICD-10-CM | POA: Diagnosis not present

## 2016-08-10 DIAGNOSIS — N186 End stage renal disease: Secondary | ICD-10-CM | POA: Diagnosis not present

## 2016-08-11 DIAGNOSIS — N2581 Secondary hyperparathyroidism of renal origin: Secondary | ICD-10-CM | POA: Diagnosis not present

## 2016-08-11 DIAGNOSIS — N186 End stage renal disease: Secondary | ICD-10-CM | POA: Diagnosis not present

## 2016-08-14 DIAGNOSIS — N2581 Secondary hyperparathyroidism of renal origin: Secondary | ICD-10-CM | POA: Diagnosis not present

## 2016-08-14 DIAGNOSIS — N186 End stage renal disease: Secondary | ICD-10-CM | POA: Diagnosis not present

## 2016-08-16 DIAGNOSIS — N186 End stage renal disease: Secondary | ICD-10-CM | POA: Diagnosis not present

## 2016-08-16 DIAGNOSIS — N2581 Secondary hyperparathyroidism of renal origin: Secondary | ICD-10-CM | POA: Diagnosis not present

## 2016-08-17 DIAGNOSIS — E662 Morbid (severe) obesity with alveolar hypoventilation: Secondary | ICD-10-CM | POA: Diagnosis not present

## 2016-08-17 DIAGNOSIS — E1151 Type 2 diabetes mellitus with diabetic peripheral angiopathy without gangrene: Secondary | ICD-10-CM | POA: Diagnosis not present

## 2016-08-17 DIAGNOSIS — E113599 Type 2 diabetes mellitus with proliferative diabetic retinopathy without macular edema, unspecified eye: Secondary | ICD-10-CM | POA: Diagnosis not present

## 2016-08-17 DIAGNOSIS — E1142 Type 2 diabetes mellitus with diabetic polyneuropathy: Secondary | ICD-10-CM | POA: Diagnosis not present

## 2016-08-17 DIAGNOSIS — M533 Sacrococcygeal disorders, not elsewhere classified: Secondary | ICD-10-CM | POA: Diagnosis not present

## 2016-08-17 DIAGNOSIS — E1122 Type 2 diabetes mellitus with diabetic chronic kidney disease: Secondary | ICD-10-CM | POA: Diagnosis not present

## 2016-08-17 DIAGNOSIS — I2721 Secondary pulmonary arterial hypertension: Secondary | ICD-10-CM | POA: Diagnosis not present

## 2016-08-17 DIAGNOSIS — J9611 Chronic respiratory failure with hypoxia: Secondary | ICD-10-CM | POA: Diagnosis not present

## 2016-08-18 DIAGNOSIS — N186 End stage renal disease: Secondary | ICD-10-CM | POA: Diagnosis not present

## 2016-08-18 DIAGNOSIS — N2581 Secondary hyperparathyroidism of renal origin: Secondary | ICD-10-CM | POA: Diagnosis not present

## 2016-08-23 ENCOUNTER — Inpatient Hospital Stay (HOSPITAL_COMMUNITY)
Admission: EM | Admit: 2016-08-23 | Discharge: 2016-08-31 | DRG: 871 | Disposition: A | Discharge status: Skilled Nursing Facility | Payer: Medicare Other | Attending: Internal Medicine | Admitting: Internal Medicine

## 2016-08-23 ENCOUNTER — Emergency Department (HOSPITAL_COMMUNITY): Payer: Medicare Other

## 2016-08-23 ENCOUNTER — Encounter (HOSPITAL_COMMUNITY): Payer: Self-pay | Admitting: *Deleted

## 2016-08-23 DIAGNOSIS — N939 Abnormal uterine and vaginal bleeding, unspecified: Secondary | ICD-10-CM | POA: Diagnosis present

## 2016-08-23 DIAGNOSIS — I2721 Secondary pulmonary arterial hypertension: Secondary | ICD-10-CM | POA: Diagnosis present

## 2016-08-23 DIAGNOSIS — I132 Hypertensive heart and chronic kidney disease with heart failure and with stage 5 chronic kidney disease, or end stage renal disease: Secondary | ICD-10-CM | POA: Diagnosis present

## 2016-08-23 DIAGNOSIS — M25561 Pain in right knee: Secondary | ICD-10-CM

## 2016-08-23 DIAGNOSIS — J449 Chronic obstructive pulmonary disease, unspecified: Secondary | ICD-10-CM | POA: Diagnosis present

## 2016-08-23 DIAGNOSIS — D631 Anemia in chronic kidney disease: Secondary | ICD-10-CM | POA: Diagnosis not present

## 2016-08-23 DIAGNOSIS — R197 Diarrhea, unspecified: Secondary | ICD-10-CM

## 2016-08-23 DIAGNOSIS — Z9981 Dependence on supplemental oxygen: Secondary | ICD-10-CM | POA: Diagnosis not present

## 2016-08-23 DIAGNOSIS — N189 Chronic kidney disease, unspecified: Secondary | ICD-10-CM | POA: Diagnosis not present

## 2016-08-23 DIAGNOSIS — Z794 Long term (current) use of insulin: Secondary | ICD-10-CM

## 2016-08-23 DIAGNOSIS — E1151 Type 2 diabetes mellitus with diabetic peripheral angiopathy without gangrene: Secondary | ICD-10-CM | POA: Diagnosis present

## 2016-08-23 DIAGNOSIS — L03116 Cellulitis of left lower limb: Secondary | ICD-10-CM | POA: Diagnosis present

## 2016-08-23 DIAGNOSIS — E8770 Fluid overload, unspecified: Secondary | ICD-10-CM | POA: Diagnosis not present

## 2016-08-23 DIAGNOSIS — Z992 Dependence on renal dialysis: Secondary | ICD-10-CM

## 2016-08-23 DIAGNOSIS — Z09 Encounter for follow-up examination after completed treatment for conditions other than malignant neoplasm: Secondary | ICD-10-CM

## 2016-08-23 DIAGNOSIS — D638 Anemia in other chronic diseases classified elsewhere: Secondary | ICD-10-CM | POA: Diagnosis present

## 2016-08-23 DIAGNOSIS — E871 Hypo-osmolality and hyponatremia: Secondary | ICD-10-CM | POA: Diagnosis present

## 2016-08-23 DIAGNOSIS — R05 Cough: Secondary | ICD-10-CM | POA: Diagnosis not present

## 2016-08-23 DIAGNOSIS — Z9842 Cataract extraction status, left eye: Secondary | ICD-10-CM

## 2016-08-23 DIAGNOSIS — Z9841 Cataract extraction status, right eye: Secondary | ICD-10-CM

## 2016-08-23 DIAGNOSIS — Z9181 History of falling: Secondary | ICD-10-CM | POA: Diagnosis not present

## 2016-08-23 DIAGNOSIS — Z961 Presence of intraocular lens: Secondary | ICD-10-CM | POA: Diagnosis present

## 2016-08-23 DIAGNOSIS — E039 Hypothyroidism, unspecified: Secondary | ICD-10-CM | POA: Diagnosis present

## 2016-08-23 DIAGNOSIS — L03119 Cellulitis of unspecified part of limb: Secondary | ICD-10-CM | POA: Diagnosis not present

## 2016-08-23 DIAGNOSIS — L02419 Cutaneous abscess of limb, unspecified: Secondary | ICD-10-CM

## 2016-08-23 DIAGNOSIS — N186 End stage renal disease: Secondary | ICD-10-CM | POA: Diagnosis not present

## 2016-08-23 DIAGNOSIS — L8990 Pressure ulcer of unspecified site, unspecified stage: Secondary | ICD-10-CM | POA: Insufficient documentation

## 2016-08-23 DIAGNOSIS — E1140 Type 2 diabetes mellitus with diabetic neuropathy, unspecified: Secondary | ICD-10-CM | POA: Diagnosis present

## 2016-08-23 DIAGNOSIS — I110 Hypertensive heart disease with heart failure: Secondary | ICD-10-CM | POA: Diagnosis not present

## 2016-08-23 DIAGNOSIS — R7989 Other specified abnormal findings of blood chemistry: Secondary | ICD-10-CM | POA: Diagnosis present

## 2016-08-23 DIAGNOSIS — E662 Morbid (severe) obesity with alveolar hypoventilation: Secondary | ICD-10-CM | POA: Diagnosis present

## 2016-08-23 DIAGNOSIS — I050 Rheumatic mitral stenosis: Secondary | ICD-10-CM | POA: Diagnosis present

## 2016-08-23 DIAGNOSIS — E1165 Type 2 diabetes mellitus with hyperglycemia: Secondary | ICD-10-CM | POA: Diagnosis present

## 2016-08-23 DIAGNOSIS — R748 Abnormal levels of other serum enzymes: Secondary | ICD-10-CM | POA: Diagnosis not present

## 2016-08-23 DIAGNOSIS — J9611 Chronic respiratory failure with hypoxia: Secondary | ICD-10-CM | POA: Diagnosis present

## 2016-08-23 DIAGNOSIS — E1129 Type 2 diabetes mellitus with other diabetic kidney complication: Secondary | ICD-10-CM | POA: Diagnosis not present

## 2016-08-23 DIAGNOSIS — I2510 Atherosclerotic heart disease of native coronary artery without angina pectoris: Secondary | ICD-10-CM | POA: Diagnosis not present

## 2016-08-23 DIAGNOSIS — Z22322 Carrier or suspected carrier of Methicillin resistant Staphylococcus aureus: Secondary | ICD-10-CM

## 2016-08-23 DIAGNOSIS — R0602 Shortness of breath: Secondary | ICD-10-CM

## 2016-08-23 DIAGNOSIS — I5033 Acute on chronic diastolic (congestive) heart failure: Secondary | ICD-10-CM | POA: Diagnosis present

## 2016-08-23 DIAGNOSIS — E113599 Type 2 diabetes mellitus with proliferative diabetic retinopathy without macular edema, unspecified eye: Secondary | ICD-10-CM | POA: Diagnosis present

## 2016-08-23 DIAGNOSIS — J410 Simple chronic bronchitis: Secondary | ICD-10-CM | POA: Diagnosis not present

## 2016-08-23 DIAGNOSIS — E0852 Diabetes mellitus due to underlying condition with diabetic peripheral angiopathy with gangrene: Secondary | ICD-10-CM | POA: Diagnosis not present

## 2016-08-23 DIAGNOSIS — I480 Paroxysmal atrial fibrillation: Secondary | ICD-10-CM | POA: Diagnosis present

## 2016-08-23 DIAGNOSIS — I119 Hypertensive heart disease without heart failure: Secondary | ICD-10-CM | POA: Diagnosis present

## 2016-08-23 DIAGNOSIS — N2581 Secondary hyperparathyroidism of renal origin: Secondary | ICD-10-CM | POA: Diagnosis present

## 2016-08-23 DIAGNOSIS — R5381 Other malaise: Secondary | ICD-10-CM | POA: Diagnosis present

## 2016-08-23 DIAGNOSIS — R079 Chest pain, unspecified: Secondary | ICD-10-CM | POA: Diagnosis not present

## 2016-08-23 DIAGNOSIS — I5032 Chronic diastolic (congestive) heart failure: Secondary | ICD-10-CM | POA: Diagnosis present

## 2016-08-23 DIAGNOSIS — R531 Weakness: Secondary | ICD-10-CM | POA: Diagnosis not present

## 2016-08-23 DIAGNOSIS — Z8673 Personal history of transient ischemic attack (TIA), and cerebral infarction without residual deficits: Secondary | ICD-10-CM

## 2016-08-23 DIAGNOSIS — E1122 Type 2 diabetes mellitus with diabetic chronic kidney disease: Secondary | ICD-10-CM | POA: Diagnosis present

## 2016-08-23 DIAGNOSIS — G8929 Other chronic pain: Secondary | ICD-10-CM

## 2016-08-23 DIAGNOSIS — Z7982 Long term (current) use of aspirin: Secondary | ICD-10-CM

## 2016-08-23 DIAGNOSIS — R404 Transient alteration of awareness: Secondary | ICD-10-CM | POA: Diagnosis not present

## 2016-08-23 DIAGNOSIS — R109 Unspecified abdominal pain: Secondary | ICD-10-CM | POA: Diagnosis not present

## 2016-08-23 DIAGNOSIS — I951 Orthostatic hypotension: Secondary | ICD-10-CM | POA: Diagnosis present

## 2016-08-23 DIAGNOSIS — A419 Sepsis, unspecified organism: Principal | ICD-10-CM | POA: Diagnosis present

## 2016-08-23 DIAGNOSIS — E875 Hyperkalemia: Secondary | ICD-10-CM

## 2016-08-23 DIAGNOSIS — R2681 Unsteadiness on feet: Secondary | ICD-10-CM | POA: Diagnosis not present

## 2016-08-23 DIAGNOSIS — J9601 Acute respiratory failure with hypoxia: Secondary | ICD-10-CM | POA: Diagnosis present

## 2016-08-23 DIAGNOSIS — K5900 Constipation, unspecified: Secondary | ICD-10-CM | POA: Diagnosis not present

## 2016-08-23 DIAGNOSIS — I509 Heart failure, unspecified: Secondary | ICD-10-CM | POA: Diagnosis not present

## 2016-08-23 DIAGNOSIS — J811 Chronic pulmonary edema: Secondary | ICD-10-CM

## 2016-08-23 DIAGNOSIS — I959 Hypotension, unspecified: Secondary | ICD-10-CM | POA: Diagnosis present

## 2016-08-23 DIAGNOSIS — K219 Gastro-esophageal reflux disease without esophagitis: Secondary | ICD-10-CM | POA: Diagnosis present

## 2016-08-23 DIAGNOSIS — M6281 Muscle weakness (generalized): Secondary | ICD-10-CM | POA: Diagnosis not present

## 2016-08-23 DIAGNOSIS — J810 Acute pulmonary edema: Secondary | ICD-10-CM | POA: Diagnosis not present

## 2016-08-23 DIAGNOSIS — Z87891 Personal history of nicotine dependence: Secondary | ICD-10-CM

## 2016-08-23 DIAGNOSIS — E118 Type 2 diabetes mellitus with unspecified complications: Secondary | ICD-10-CM | POA: Diagnosis not present

## 2016-08-23 DIAGNOSIS — Z6841 Body Mass Index (BMI) 40.0 and over, adult: Secondary | ICD-10-CM | POA: Diagnosis not present

## 2016-08-23 DIAGNOSIS — J9621 Acute and chronic respiratory failure with hypoxia: Secondary | ICD-10-CM | POA: Diagnosis present

## 2016-08-23 DIAGNOSIS — I447 Left bundle-branch block, unspecified: Secondary | ICD-10-CM | POA: Diagnosis present

## 2016-08-23 DIAGNOSIS — E785 Hyperlipidemia, unspecified: Secondary | ICD-10-CM | POA: Diagnosis present

## 2016-08-23 DIAGNOSIS — F419 Anxiety disorder, unspecified: Secondary | ICD-10-CM | POA: Diagnosis present

## 2016-08-23 DIAGNOSIS — F329 Major depressive disorder, single episode, unspecified: Secondary | ICD-10-CM | POA: Diagnosis present

## 2016-08-23 DIAGNOSIS — Z79899 Other long term (current) drug therapy: Secondary | ICD-10-CM

## 2016-08-23 DIAGNOSIS — I872 Venous insufficiency (chronic) (peripheral): Secondary | ICD-10-CM | POA: Diagnosis present

## 2016-08-23 DIAGNOSIS — R778 Other specified abnormalities of plasma proteins: Secondary | ICD-10-CM | POA: Diagnosis present

## 2016-08-23 HISTORY — DX: Dependence on renal dialysis: Z99.2

## 2016-08-23 HISTORY — DX: End stage renal disease: N18.6

## 2016-08-23 HISTORY — DX: Restless legs syndrome: G25.81

## 2016-08-23 LAB — CBC WITH DIFFERENTIAL/PLATELET
BASOS ABS: 0 10*3/uL (ref 0.0–0.1)
Basophils Relative: 0 %
EOS ABS: 0 10*3/uL (ref 0.0–0.7)
Eosinophils Relative: 0 %
HCT: 32.9 % — ABNORMAL LOW (ref 36.0–46.0)
HEMOGLOBIN: 10.9 g/dL — AB (ref 12.0–15.0)
LYMPHS PCT: 2 %
Lymphs Abs: 0.6 10*3/uL — ABNORMAL LOW (ref 0.7–4.0)
MCH: 33.9 pg (ref 26.0–34.0)
MCHC: 33.1 g/dL (ref 30.0–36.0)
MCV: 102.2 fL — ABNORMAL HIGH (ref 78.0–100.0)
Monocytes Absolute: 1.2 10*3/uL — ABNORMAL HIGH (ref 0.1–1.0)
Monocytes Relative: 4 %
NEUTROS ABS: 29.2 10*3/uL — AB (ref 1.7–7.7)
Neutrophils Relative %: 94 %
Platelets: 212 10*3/uL (ref 150–400)
RBC: 3.22 MIL/uL — ABNORMAL LOW (ref 3.87–5.11)
RDW: 16.7 % — AB (ref 11.5–15.5)
WBC: 31 10*3/uL — AB (ref 4.0–10.5)

## 2016-08-23 LAB — GLUCOSE, CAPILLARY
Glucose-Capillary: 137 mg/dL — ABNORMAL HIGH (ref 65–99)
Glucose-Capillary: 119 mg/dL — ABNORMAL HIGH (ref 65–99)

## 2016-08-23 LAB — RENAL FUNCTION PANEL
Albumin: 2.7 g/dL — ABNORMAL LOW (ref 3.5–5.0)
Anion gap: 16 — ABNORMAL HIGH (ref 5–15)
BUN: 34 mg/dL — ABNORMAL HIGH (ref 6–20)
CO2: 19 mmol/L — ABNORMAL LOW (ref 22–32)
Calcium: 8.3 mg/dL — ABNORMAL LOW (ref 8.9–10.3)
Chloride: 97 mmol/L — ABNORMAL LOW (ref 101–111)
Creatinine, Ser: 4.43 mg/dL — ABNORMAL HIGH (ref 0.44–1.00)
GFR calc Af Amer: 11 mL/min — ABNORMAL LOW (ref >60)
GFR calc non Af Amer: 10 mL/min — ABNORMAL LOW (ref >60)
Glucose, Bld: 148 mg/dL — ABNORMAL HIGH (ref 65–99)
Phosphorus: 5 mg/dL — ABNORMAL HIGH (ref 2.5–4.6)
Potassium: 4.4 mmol/L (ref 3.5–5.1)
Sodium: 132 mmol/L — ABNORMAL LOW (ref 135–145)

## 2016-08-23 LAB — COMPREHENSIVE METABOLIC PANEL
ALBUMIN: 3.3 g/dL — AB (ref 3.5–5.0)
ALT: 22 U/L (ref 14–54)
ANION GAP: 18 — AB (ref 5–15)
AST: 15 U/L (ref 15–41)
Alkaline Phosphatase: 182 U/L — ABNORMAL HIGH (ref 38–126)
BUN: 86 mg/dL — ABNORMAL HIGH (ref 6–20)
CO2: 10 mmol/L — AB (ref 22–32)
Calcium: 9.1 mg/dL (ref 8.9–10.3)
Chloride: 95 mmol/L — ABNORMAL LOW (ref 101–111)
Creatinine, Ser: 8.82 mg/dL — ABNORMAL HIGH (ref 0.44–1.00)
GFR calc Af Amer: 5 mL/min — ABNORMAL LOW (ref >60)
GFR calc non Af Amer: 4 mL/min — ABNORMAL LOW (ref >60)
GLUCOSE: 381 mg/dL — AB (ref 65–99)
SODIUM: 123 mmol/L — AB (ref 135–145)
Total Bilirubin: 0.8 mg/dL (ref 0.3–1.2)
Total Protein: 8.8 g/dL — ABNORMAL HIGH (ref 6.5–8.1)

## 2016-08-23 LAB — SAVE SMEAR

## 2016-08-23 LAB — MRSA PCR SCREENING: MRSA by PCR: POSITIVE — AB

## 2016-08-23 LAB — CBC
HCT: 30 % — ABNORMAL LOW (ref 36.0–46.0)
Hemoglobin: 9.9 g/dL — ABNORMAL LOW (ref 12.0–15.0)
MCH: 33.1 pg (ref 26.0–34.0)
MCHC: 33 g/dL (ref 30.0–36.0)
MCV: 100.3 fL — ABNORMAL HIGH (ref 78.0–100.0)
Platelets: 165 10*3/uL (ref 150–400)
RBC: 2.99 MIL/uL — ABNORMAL LOW (ref 3.87–5.11)
RDW: 16.4 % — ABNORMAL HIGH (ref 11.5–15.5)
WBC: 20.7 10*3/uL — ABNORMAL HIGH (ref 4.0–10.5)

## 2016-08-23 MED ORDER — SODIUM CHLORIDE 0.9 % IV SOLN: 100.0000 mL | INTRAVENOUS | Status: DC | PRN

## 2016-08-23 MED ORDER — SODIUM CHLORIDE 0.9 % IV SOLN
1.0000 g | Freq: Once | INTRAVENOUS | Status: AC
  Administered 2016-08-23: 1 g via INTRAVENOUS
  Filled 2016-08-23: qty 10

## 2016-08-23 MED ORDER — ALBUTEROL SULFATE (2.5 MG/3ML) 0.083% IN NEBU
5.0000 mg | INHALATION_SOLUTION | Freq: Once | RESPIRATORY_TRACT | Status: AC
  Administered 2016-08-23: 5 mg via RESPIRATORY_TRACT
  Filled 2016-08-23: qty 6

## 2016-08-23 MED ORDER — INSULIN GLARGINE 100 UNIT/ML ~~LOC~~ SOLN
25.0000 [IU] | Freq: Two times a day (BID) | SUBCUTANEOUS | Status: DC
  Administered 2016-08-24 – 2016-08-28 (×9): 25 [IU] via SUBCUTANEOUS
  Filled 2016-08-23 (×11): qty 0.25

## 2016-08-23 MED ORDER — HYDROCODONE-ACETAMINOPHEN 5-325 MG PO TABS: 1.0000 | ORAL_TABLET | ORAL | Status: DC | PRN

## 2016-08-23 MED ORDER — ASPIRIN EC 81 MG PO TBEC
81.0000 mg | DELAYED_RELEASE_TABLET | Freq: Every day | ORAL | Status: DC
  Administered 2016-08-24 – 2016-08-31 (×8): 81 mg via ORAL
  Filled 2016-08-23 (×8): qty 1

## 2016-08-23 MED ORDER — DEXTROSE 5 % IV SOLN
2.0000 g | INTRAVENOUS | Status: DC
  Administered 2016-08-23: 2 g via INTRAVENOUS
  Filled 2016-08-23 (×2): qty 2

## 2016-08-23 MED ORDER — ONDANSETRON HCL 4 MG/2ML IJ SOLN: 4.0000 mg | Freq: Four times a day (QID) | INTRAMUSCULAR | Status: DC | PRN

## 2016-08-23 MED ORDER — ALTEPLASE 2 MG IJ SOLR: 2.0000 mg | Freq: Once | INTRAMUSCULAR | Status: DC | PRN

## 2016-08-23 MED ORDER — DEXTROSE 50 % IV SOLN
1.0000 | Freq: Once | INTRAVENOUS | Status: AC
  Administered 2016-08-23: 50 mL via INTRAVENOUS
  Filled 2016-08-23: qty 50

## 2016-08-23 MED ORDER — HEPARIN SODIUM (PORCINE) 1000 UNIT/ML DIALYSIS
1000.0000 [IU] | INTRAMUSCULAR | Status: DC | PRN
  Filled 2016-08-23: qty 1

## 2016-08-23 MED ORDER — PIPERACILLIN-TAZOBACTAM 3.375 G IVPB 30 MIN
3.3750 g | Freq: Once | INTRAVENOUS | Status: DC
  Filled 2016-08-23 (×2): qty 50

## 2016-08-23 MED ORDER — TRAZODONE HCL 50 MG PO TABS: 25.0000 mg | ORAL_TABLET | Freq: Every evening | ORAL | Status: DC | PRN

## 2016-08-23 MED ORDER — ACETAMINOPHEN 650 MG RE SUPP: 650.0000 mg | Freq: Four times a day (QID) | RECTAL | Status: DC | PRN

## 2016-08-23 MED ORDER — INSULIN ASPART 100 UNIT/ML IV SOLN
10.0000 [IU] | Freq: Once | INTRAVENOUS | Status: AC
  Administered 2016-08-23: 10 [IU] via INTRAVENOUS

## 2016-08-23 MED ORDER — SODIUM CHLORIDE 0.9% FLUSH
3.0000 mL | Freq: Two times a day (BID) | INTRAVENOUS | Status: DC
  Administered 2016-08-23 – 2016-08-31 (×10): 3 mL via INTRAVENOUS

## 2016-08-23 MED ORDER — IOPAMIDOL (ISOVUE-300) INJECTION 61%
100.0000 mL | Freq: Once | INTRAVENOUS | Status: DC | PRN
Start: 2016-08-23 — End: 2016-08-31

## 2016-08-23 MED ORDER — PIPERACILLIN-TAZOBACTAM 3.375 G IVPB
3.3750 g | Freq: Two times a day (BID) | INTRAVENOUS | Status: DC
  Filled 2016-08-23: qty 50

## 2016-08-23 MED ORDER — MIDODRINE HCL 5 MG PO TABS: 10.0000 mg | ORAL_TABLET | Freq: Two times a day (BID) | ORAL | Status: DC

## 2016-08-23 MED ORDER — LIDOCAINE-PRILOCAINE 2.5-2.5 % EX CREA
1.0000 "application " | TOPICAL_CREAM | CUTANEOUS | Status: DC | PRN
  Filled 2016-08-23: qty 5

## 2016-08-23 MED ORDER — PENTAFLUOROPROP-TETRAFLUOROETH EX AERO
1.0000 "application " | INHALATION_SPRAY | CUTANEOUS | Status: DC | PRN
  Filled 2016-08-23: qty 30

## 2016-08-23 MED ORDER — ORAL CARE MOUTH RINSE
15.0000 mL | Freq: Two times a day (BID) | OROMUCOSAL | Status: DC
  Administered 2016-08-23 – 2016-08-31 (×13): 15 mL via OROMUCOSAL

## 2016-08-23 MED ORDER — MIDODRINE HCL 5 MG PO TABS
10.0000 mg | ORAL_TABLET | ORAL | Status: DC
  Administered 2016-08-24: 10 mg via ORAL

## 2016-08-23 MED ORDER — RENA-VITE PO TABS
1.0000 | ORAL_TABLET | Freq: Every day | ORAL | Status: DC
  Administered 2016-08-23 – 2016-08-30 (×8): 1 via ORAL
  Filled 2016-08-23 (×8): qty 1

## 2016-08-23 MED ORDER — INSULIN ASPART 100 UNIT/ML ~~LOC~~ SOLN
0.0000 [IU] | Freq: Three times a day (TID) | SUBCUTANEOUS | Status: DC
  Administered 2016-08-23: 1 [IU] via SUBCUTANEOUS
  Administered 2016-08-25: 3 [IU] via SUBCUTANEOUS
  Administered 2016-08-25: 9 [IU] via SUBCUTANEOUS
  Administered 2016-08-25: 7 [IU] via SUBCUTANEOUS

## 2016-08-23 MED ORDER — BISACODYL 10 MG RE SUPP: 10.0000 mg | Freq: Every day | RECTAL | Status: DC | PRN

## 2016-08-23 MED ORDER — MIDODRINE HCL 5 MG PO TABS
ORAL_TABLET | ORAL | Status: AC
  Administered 2016-08-23: 10 mg
  Filled 2016-08-23: qty 2

## 2016-08-23 MED ORDER — RENA-VITE PO TABS: 1.0000 | ORAL_TABLET | Freq: Every day | ORAL | Status: DC

## 2016-08-23 MED ORDER — LEVOTHYROXINE SODIUM 75 MCG PO TABS
75.0000 ug | ORAL_TABLET | Freq: Every day | ORAL | Status: DC
  Administered 2016-08-24 – 2016-08-31 (×8): 75 ug via ORAL
  Filled 2016-08-23 (×9): qty 1

## 2016-08-23 MED ORDER — ONDANSETRON HCL 4 MG PO TABS: 4.0000 mg | ORAL_TABLET | Freq: Four times a day (QID) | ORAL | Status: DC | PRN

## 2016-08-23 MED ORDER — LIDOCAINE HCL (PF) 1 % IJ SOLN: 5.0000 mL | INTRAMUSCULAR | Status: DC | PRN

## 2016-08-23 MED ORDER — CHLORHEXIDINE GLUCONATE CLOTH 2 % EX PADS
6.0000 | MEDICATED_PAD | Freq: Every day | CUTANEOUS | Status: AC
  Administered 2016-08-24 – 2016-08-28 (×5): 6 via TOPICAL

## 2016-08-23 MED ORDER — MIDODRINE HCL 5 MG PO TABS
10.0000 mg | ORAL_TABLET | Freq: Two times a day (BID) | ORAL | Status: DC
Start: 2016-08-24 — End: 2016-08-23

## 2016-08-23 MED ORDER — HEPARIN SODIUM (PORCINE) 1000 UNIT/ML DIALYSIS
4500.0000 [IU] | Freq: Once | INTRAMUSCULAR | Status: DC
  Filled 2016-08-23: qty 5

## 2016-08-23 MED ORDER — PIPERACILLIN-TAZOBACTAM 3.375 G IVPB
3.3750 g | Freq: Two times a day (BID) | INTRAVENOUS | Status: DC
  Administered 2016-08-23 – 2016-08-27 (×9): 3.375 g via INTRAVENOUS
  Filled 2016-08-23 (×12): qty 50

## 2016-08-23 MED ORDER — ACETAMINOPHEN 325 MG PO TABS: 650.0000 mg | ORAL_TABLET | Freq: Four times a day (QID) | ORAL | Status: DC | PRN

## 2016-08-23 MED ORDER — MAGNESIUM CITRATE PO SOLN: 1.0000 | Freq: Once | ORAL | Status: DC | PRN

## 2016-08-23 MED ORDER — METOCLOPRAMIDE HCL 5 MG/ML IJ SOLN
10.0000 mg | Freq: Two times a day (BID) | INTRAMUSCULAR | Status: DC
  Administered 2016-08-23 – 2016-08-31 (×15): 10 mg via INTRAVENOUS
  Filled 2016-08-23 (×15): qty 2

## 2016-08-23 MED ORDER — ASPIRIN EC 81 MG PO TBEC: 81.0000 mg | DELAYED_RELEASE_TABLET | Freq: Every day | ORAL | Status: DC

## 2016-08-23 MED ORDER — SENNOSIDES-DOCUSATE SODIUM 8.6-50 MG PO TABS
1.0000 | ORAL_TABLET | Freq: Every evening | ORAL | Status: DC | PRN
  Filled 2016-08-23: qty 1

## 2016-08-23 MED ORDER — MUPIROCIN 2 % EX OINT
1.0000 "application " | TOPICAL_OINTMENT | Freq: Two times a day (BID) | CUTANEOUS | Status: AC
  Administered 2016-08-23 – 2016-08-28 (×10): 1 via NASAL

## 2016-08-23 MED ORDER — IOPAMIDOL (ISOVUE-300) INJECTION 61%
INTRAVENOUS | Status: AC
  Administered 2016-08-23: 100 mL
  Filled 2016-08-23: qty 100

## 2016-08-23 NOTE — ED Notes (Signed)
Dialysis called for report. This RN transported patient upstairs.

## 2016-08-23 NOTE — ED Notes (Signed)
Patient at CT

## 2016-08-23 NOTE — H&P (Signed)
History and Physical    Kimberly ManilaSusan K Larocca RUE:[id number]:[id number] DOB: [birth year] DOA: 08/23/2016   PCP: Ginette OttoSTONEKING,[name] THOMAS, MD   Patient coming from:  Home   Chief Complaint: SHortness of breath                                 LLQ pain and recent  diarrhea   HPI: Kimberly [name] is a 60 y.o. female with extensive medical history listed below, presenting with generalized weakness, shortness of breath, reporting being unable to walk less than 5 feet. No cough or sputum production. She denies any chest pain or palpitations. She has increasing orthopnea. Patient suspects symptoms related to missing HD on Saturday due to diarrhea beginning a day prior . She denies any sick contacts, or food poisoning. She does report left lower quadrant pain beginning around the time. Her stools were watery, but has not had any further episodes since last night. She denies any back pain. She is no longer making urine. She denies any nausea or vomiting. She denies any worsening of her lower extremity swelling. She denies any fever chills or night sweats. No confusion is reported.  ED Course:  BP (!) 106/20   Pulse 80   Temp 98.1 F (36.7 C) (Oral)   Resp 19   Ht 5\' 4"  (1.626 m)   Wt 125.6 kg (277 lb)   SpO2 98%   BMI 47.55 kg/m    sodium 123 potassium 7.5, with creatinine 8.82. Alkaline phosphatase 182, AST 15 ALT 22, bilirubin 0.8 WBC 31 hemoglobin 10.9., Platelets 212 CT of the abdomen and pelvis is negative for acute changes. Chest x-ray positive for CHF with mild pulmonary edema 2D echo EF 55 to 60%, normal LV function, grade 2 diastolic dysfunction.  Review of Systems: As per HPI otherwise 10 point review of systems negative.   Past Medical History:  Diagnosis Date  . Anemia   . Anxiety   . Arthritis    "aches and pains all over" (04/06/2015)  . Asthma   . CAD, NATIVE VESSEL    May 10, 2010 cath showed a hyperdynamic LV function, she had dominant circumflex anatomy with a 70-80% small OM1. She had  diffuse diabetic plaque particularly in the distal LAD. She nondominant RCA.  Nondominant  . Cellulitis 10/15/2013  . CHF (congestive heart failure) (HCC)    Preserved EF  . Chronic bronchitis (HCC)    "often; usually q yr" (04/06/2015)  . COPD   . Depression   . Diabetic neuropathy (HCC)   . End stage renal disease (HCC)    dialysis M-W-F  . Endometrial hyperplasia   . GERD (gastroesophageal reflux disease)   . History of hiatal hernia   . HYPERLIPIDEMIA   . HYPERTENSION   . Hypothyroidism   . On home oxygen therapy    "2.5L; 24/7" (04/06/2015)  . Paralyzed vocal cords   . Peripheral neuropathy (HCC)    hx/notes 01/27/2010  . Pneumonia "several times"  . Proliferative retinopathy    hx/notes 01/27/2010  . PVD    CEA  . Sleep apnea    not on cpap    . 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 Type II diabetes mellitus (HCC)     Past Surgical History:  Procedure Laterality Date  . AV  FISTULA PLACEMENT Right 07/08/2015   Procedure: EXPLORATION RIGHT AXILLARY ARTERY AND RIGHT BRACHIAL VEIN;  Surgeon: Chuck Hint, MD;  Location: [facility] OR;  Service: Vascular;  Laterality: Right;  . CAROTID ENDARTERECTOMY Left X 2  . CATARACT EXTRACTION W/ INTRAOCULAR LENS  IMPLANT, BILATERAL Bilateral   . COLONOSCOPY    . EYE SURGERY     numerous surgeries  . INSERTION OF DIALYSIS CATHETER N/A 06/24/2015   Procedure: ULTRASOUND BILATERAL INTERNAL JUGULAR VEIN INSERTION OF DIALYSIS CATHETER LEFT INTERNAL JUGULAR VEIN ;  Surgeon: Pryor Ochoa, MD;  Location: [facility] OR;  Service: Vascular;  Laterality: N/A;  . VITRECTOMY Bilateral     Social History Social History   Social History  . Marital status: Married    Spouse name: N/A  . Number of children: 0  . Years of education: N/A   Occupational History  . disabled    Social History Main Topics  . Smoking status: Former  Smoker    Packs/day: 1.00    Years: 20.00    Types: Cigarettes    Quit date: 09/11/2005  . Smokeless tobacco: Never Used  . Alcohol use No  . Drug use: No  . Sexual activity: No   Other Topics Concern  . Not on file   Social History Narrative  . No narrative on file     Allergies  Allergen Reactions  . Ciprofloxacin Itching  . Epinephrine     Increased heart rate    Family History  Problem Relation Age of Onset  . Cancer Mother     Breast, NHL  . Stroke Mother   . Peripheral vascular disease Father   . CAD Father 60  . Heart attack Father   . Hypertension Father   . Asthma Father   . Heart disease Father     before age 5      Prior to Admission medications   Medication Sig Start Date End Date Taking? Authorizing Provider  acetaminophen (TYLENOL) 325 MG tablet Take 650 mg by mouth every 6 (six) hours as needed for mild pain.   Yes Historical Provider, MD  aspirin EC 81 MG tablet Take 81 mg by mouth daily.   Yes Historical Provider, MD  docusate sodium (COLACE) 100 MG capsule Take 100 mg by mouth daily as needed for mild constipation.   Yes Historical Provider, MD  hydrocerin (EUCERIN) CREA Apply 1 application topically 2 (two) times daily. 07/20/15  Yes Nishant Dhungel, MD  insulin aspart (NOVOLOG) 100 UNIT/ML injection Inject 6 Units into the skin 3 (three) times daily with meals. 07/05/14  Yes Zannie Cove, MD  insulin glargine (LANTUS) 100 UNIT/ML injection Inject 60 Units into the skin 2 (two) times daily.    Yes Historical Provider, MD  levonorgestrel (MIRENA) 20 MCG/24HR IUD 1 Intra Uterine Device (1 each total) by Intrauterine route once. 08/16/15  Yes Adolphus Birchwood, MD  levothyroxine (SYNTHROID, LEVOTHROID) 75 MCG tablet Take 75 mcg by mouth daily before breakfast.   Yes Historical Provider, MD  midodrine (PROAMATINE) 10 MG tablet Take 1 tablet (10 mg total) by mouth 2 (two) times daily with a meal. Patient taking differently: Take 10 mg by mouth every other day.   07/20/15  Yes Nishant Dhungel, MD  multivitamin (RENA-VIT) TABS tablet Take 1 tablet by mouth daily.   Yes Historical Provider, MD  OXYGEN 2lpm 24/7- [facility]   Yes Historical Provider, MD  sodium chloride (OCEAN) 0.65 % SOLN nasal spray Place 1 spray into both nostrils as  needed for congestion.   Yes Historical Provider, MD    Physical Exam:    Vitals:   08/23/16 0945 08/23/16 1000 08/23/16 1122 08/23/16 1130  BP: (!) 136/43 (!) 121/105 (!) 92/49 (!) 106/20  Pulse: 78 77 78 80  Resp: 24 20 24 19   Temp:      TempSrc:      SpO2: 100% 98% 99% 98%  Weight:      Height:           Constitutional: ill appearing, uncomfortable due to shortness of breath  Vitals:   08/23/16 0945 08/23/16 1000 08/23/16 1122 08/23/16 1130  BP: (!) 136/43 (!) 121/105 (!) 92/49 (!) 106/20  Pulse: 78 77 78 80  Resp: 24 20 24 19   Temp:      TempSrc:      SpO2: 100% 98% 99% 98%  Weight:      Height:       Eyes: PERRL, lids and conjunctivae normal ENMT: Mucous membranes are moist. Posterior pharynx clear of any exudate or lesions.Normal dentition.  Neck: normal, supple, no masses, no thyromegaly Respiratory: decreased breath sounds at the bases, no wheezing or rhonchi  No accessory muscle use.  Cardiovascular: Regular rate and rhythm, no murmurs / rubs / gallops.1-2 + bilateral lower extremity edema. 2+ pedal pulses. No carotid bruits.  Abdomen:  Morbidly obese , moderate LLQ  tenderness, no masses palpated. No hepatosplenomegaly. Bowel sounds positive.  Musculoskeletal: no clubbing / cyanosis.  Chronic venous stasis bilaterally No joint deformity upper and lower extremities. Good ROM, no contractures. Normal muscle tone.  Skin: no rashes, lesions, ulcers.  Neurologic: CN 2-12 grossly intact. Sensation intact, DTR normal. Strength 5/5 in all 4.  Psychiatric: Normal judgment and insight. Alert and oriented x 3. Anxious    Labs on Admission: I have personally reviewed following labs and imaging  studies  CBC:  Recent Labs Lab 08/23/16 0940  WBC 31.0*  NEUTROABS 29.2*  HGB 10.9*  HCT 32.9*  MCV 102.2*  PLT 212    Basic Metabolic Panel:  Recent Labs Lab 08/23/16 0940  NA 123*  K >7.5*  CL 95*  CO2 10*  GLUCOSE 381*  BUN 86*  CREATININE 8.82*  CALCIUM 9.1    GFR: Estimated Creatinine Clearance: 8.8 mL/min (by C-G formula based on SCr of 8.82 mg/dL (H)).  Liver Function Tests:  Recent Labs Lab 08/23/16 0940  AST 15  ALT 22  ALKPHOS 182*  BILITOT 0.8  PROT 8.8*  ALBUMIN 3.3*   No results for input(s): LIPASE, AMYLASE in the last 168 hours. No results for input(s): AMMONIA in the last 168 hours.  Coagulation Profile: No results for input(s): INR, PROTIME in the last 168 hours.  Cardiac Enzymes: No results for input(s): CKTOTAL, CKMB, CKMBINDEX, TROPONINI in the last 168 hours.  BNP (last 3 results) No results for input(s): PROBNP in the last 8760 hours.  HbA1C: No results for input(s): HGBA1C in the last 72 hours.  CBG: No results for input(s): GLUCAP in the last 168 hours.  Lipid Profile: No results for input(s): CHOL, HDL, LDLCALC, TRIG, CHOLHDL, LDLDIRECT in the last 72 hours.  Thyroid Function Tests: No results for input(s): TSH, T4TOTAL, FREET4, T3FREE, THYROIDAB in the last 72 hours.  Anemia Panel: No results for input(s): VITAMINB12, FOLATE, FERRITIN, TIBC, IRON, RETICCTPCT in the last 72 hours.  Urine analysis:    Component Value Date/Time   COLORURINE RED (A) 07/08/2015 1705   APPEARANCEUR TURBID (A) 07/08/2015 1705  LABSPEC 1.023 07/08/2015 1705   PHURINE 5.0 07/08/2015 1705   GLUCOSEU 100 (A) 07/08/2015 1705   HGBUR MODERATE (A) 07/08/2015 1705   BILIRUBINUR MODERATE (A) 07/08/2015 1705   KETONESUR 15 (A) 07/08/2015 1705   PROTEINUR 100 (A) 07/08/2015 1705   UROBILINOGEN 1.0 07/08/2015 1705   NITRITE POSITIVE (A) 07/08/2015 1705   LEUKOCYTESUR SMALL (A) 07/08/2015 1705    Sepsis  Labs: @LABRCNTIP (procalcitonin:4,lacticidven:4) )No results found for this or any previous visit (from the past 240 hour(s)).   Radiological Exams on Admission: Ct Abdomen Pelvis W Contrast  Result Date: 08/23/2016 CLINICAL DATA:  The sided abdominal pain EXAM: CT ABDOMEN AND PELVIS WITH CONTRAST TECHNIQUE: Multidetector CT imaging of the abdomen and pelvis was performed using the standard protocol following bolus administration of intravenous contrast. CONTRAST:  ISOVUE-300 IOPAMIDOL (ISOVUE-300) INJECTION 61% COMPARISON:  None. FINDINGS: Lower chest: No acute abnormality. Hepatobiliary: The liver is diffusely fatty infiltrated. The gallbladder is within normal limits. Pancreas: Unremarkable. No pancreatic ductal dilatation or surrounding inflammatory changes. Spleen: Normal in size without focal abnormality. Adrenals/Urinary Tract: Adrenal glands are unremarkable. Kidneys are normal, without renal calculi, focal lesion, or hydronephrosis. Bladder is unremarkable. Stomach/Bowel: Stomach is within normal limits. Appendix appears normal. No evidence of bowel wall thickening, distention, or inflammatory changes. Vascular/Lymphatic: Aortic atherosclerosis. No enlarged abdominal or pelvic lymph nodes. Reproductive: IUD is noted in place. No other focal abnormality is noted. Other: Some laxity of the anterior abdominal wall is noted although no true hernia is seen. No abdominopelvic ascites. Musculoskeletal: No acute or significant osseous findings. IMPRESSION: Chronic changes without acute abnormality. Electronically Signed   By: Alcide Clever M.D.   On: 08/23/2016 10:56   Dg Chest Portable 1 View  Result Date: 08/23/2016 CLINICAL DATA:  Abdominal pain. EXAM: PORTABLE CHEST 1 VIEW COMPARISON:  07/10/2015 . FINDINGS: Left IJ dual-lumen catheter noted with tip projected over right atrium. Cardiomegaly with bilateral pulmonary interstitial prominence consistent with congestive heart failure. No pleural  effusion or pneumothorax . IMPRESSION: 1. Left IJ dual-lumen catheter with tip projected over the right atrium. 2. Congestive heart failure with mild pulmonary interstitial edema. Electronically Signed   By: Maisie Fus  Register   On: 08/23/2016 10:00    EKG: Independently reviewed.  Assessment/Plan Active Problems:   Hyperkalemia   Shortness of breath   Hyponatremia   DM (diabetes mellitus), type 2 with renal complications (HCC)   Hypertensive heart disease   CAD (coronary artery disease), native coronary artery   COPD (chronic obstructive pulmonary disease) (HCC)   Chronic diastolic CHF (congestive heart failure) (HCC)   Chronic respiratory failure with hypoxia (HCC)   On home oxygen therapy   Hypothyroidism   Mitral valve stenosis, moderate   Morbid obesity (HCC)   Physical deconditioning   Obesity hypoventilation syndrome (HCC)   ESRD (end stage renal disease) (HCC)   Paroxysmal atrial fibrillation (HCC)   Pulmonary arterial hypertension    Acute  On chronic hypoxic respiratory failure likely secondary to flluid overload after missing HD last Saturday (T/Th/S)  . History of CHF and COPD on O2 at home  CXR with CHF and mild peripheral edema .2D echo 06/2015 EF 55 to 60%, normal LV function, great to diastolic dysfunction.WBC 31  Telemetry Obs  -  Daily weights and strict I/O  Patient to undergo HD today under the direction of Dr. Arlean Hopping. Further recommendations as per Renal. Of note, it is expected that the patient's metabolic abnormalities including hyperkalemia 7.3  and hyponatremia 123  .  Repeat CMET, CXR in am  Will hold IVF at this time due to HD  Continue  O2. May need Nebs if respiratory symptoms do not improve after HD    Leukocytosis, likely related to underlying infection  including of GI source versus line infection . Recent diarrhea with abdominal pain, no stools since last night  . WBC 31  CXR not indicative of infiltrative process Afebrile . CT abdomen and pelvis  negative for acute findings.  History of ESRD as above  Blood culture . If positive, likely to d/c the IJ cath  IV Zosyn  Repeat CBC in am  Request lab to verify on underlying bone marrow abnormalities in smear     Atrial Fibrillation CHA2DS2-VASc score 6, not on anticoagulation due to prior history of vaginal bleed   Rate controlled -Continue meds  Anemia of chronic disease Hemoglobin on admission 10.9 at [facility]  Repeat CBC in am No transfusion is indicated at this time    Type II Diabetes Current blood sugar level is 381 Lab Results  Component Value Date   HGBA1C 8.7 (H) 04/15/2015   Hgb A1C  SSI, Lantus   Hypothyroidism: -Continue home Synthroid  History of Orthostatic hypotension  Continue midodrine qod, next dose on 12/14   Physical Deconditioning OT/PT    DVT prophylaxis: SCDs Code Status:   Full    Family Communication:  Discussed with patient Disposition Plan: Expect patient to be discharged to home after condition improves Consults called:   Renal  Admission status:Tele  Obs     Jamieon Lannen E, PA-C [facility]   08/23/2016, 12:26 PM

## 2016-08-23 NOTE — Consult Note (Signed)
[facility] Renal Consultation Note    Indication for Consultation:  Management of ESRD/hemodialysis; anemia, hypertension/volume and secondary hyperparathyroidism PCP: Dr. Merlene Laughter Cardiologist: Dr. Royann Shivers  HPI: Kimberly [name] is a 60 y.o.female with ESRD 2/2 cardiorenal syndrome/DM. She has hemodialysis MWF at [facility]. PHM significant for DM, diabetic,morbid obesity, chronic diastolic HF, endometrial hyperplasia ,  COPD (former smoker) hypertension-now hypotension on midodrine, pulmonary hypertension, proliferative retinopathy, chronic edema/cellulitis BLE, CAD, PAF no longer on Eliquis D/T bleeding R/T endometrial hyperplasia, PVD, CEA, OSA (not on CPAP).   Patient states she missed hemodialysis Monday D/T diarrhea which has now resolved. Did not call HD unit back to confirm offered treatment X 2 on Tuesday, Presented to ED today with C/O weakness, increased SOB, DOE. K+ on arrival to ED was >7.5, Scr 8.82 BUN 86 BS 381 Ca 9.1 C Ca 9.7 HGB 10.9 WBC 31.0.  CXR showed congestive heart failure with mild pulmonary interstitial edema, prominently in R lung. Does not actually appear unchanged from CXR done 07/09/2016. She is awake, alert, oriented, mental status at baseline. No other C/Os at present. Arrangements have been made for urgent hemodialysis for hyperkalemia and volume removal as tolerated. Patient has had chronic leukocytosis with WBC 13.5-12.33 last 2 months. WBC now 31.0 Recent diarrhea-concern for C Diff, possible infected TDC.   Patient has had chronic issues with high IDWG/refusing to allow more than 3000cc UFG to be removed. Required intervention with staff to discuss this behavior. Patient recently got to EDW for 1st time, has arrived to HD unit with lower IDWG last 3 treatments.    Past Medical History:  Diagnosis Date  . Anemia   . Anxiety   . Arthritis    "aches and pains all over" (04/06/2015)  . Asthma   . CAD, NATIVE VESSEL     May 10, 2010 cath showed a hyperdynamic LV function, she had dominant circumflex anatomy with a 70-80% small OM1. She had diffuse diabetic plaque particularly in the distal LAD. She nondominant RCA.  Nondominant  . Cellulitis 10/15/2013  . CHF (congestive heart failure) (HCC)    Preserved EF  . Chronic bronchitis (HCC)    "often; usually q yr" (04/06/2015)  . COPD   . Depression   . Diabetic neuropathy (HCC)   . End stage renal disease (HCC)    dialysis M-W-F  . Endometrial hyperplasia   . GERD (gastroesophageal reflux disease)   . History of hiatal hernia   . HYPERLIPIDEMIA   . HYPERTENSION   . Hypothyroidism   . On home oxygen therapy    "2.5L; 24/7" (04/06/2015)  . Paralyzed vocal cords   . Peripheral neuropathy (HCC)    hx/notes 01/27/2010  . Pneumonia "several times"  . Proliferative retinopathy    hx/notes 01/27/2010  . PVD    CEA  . Sleep apnea    not on cpap    . 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 Type II diabetes mellitus (HCC)    Past Surgical History:  Procedure Laterality Date  . AV FISTULA PLACEMENT Right 07/08/2015   Procedure: EXPLORATION RIGHT AXILLARY ARTERY AND RIGHT BRACHIAL VEIN;  Surgeon: Chuck Hint, MD;  Location: [facility] OR;  Service: Vascular;  Laterality: Right;  . CAROTID ENDARTERECTOMY Left X 2  . CATARACT EXTRACTION W/ INTRAOCULAR LENS  IMPLANT, BILATERAL Bilateral   .  COLONOSCOPY    . EYE SURGERY     numerous surgeries  . INSERTION OF DIALYSIS CATHETER N/A 06/24/2015   Procedure: ULTRASOUND BILATERAL INTERNAL JUGULAR VEIN INSERTION OF DIALYSIS CATHETER LEFT INTERNAL JUGULAR VEIN ;  Surgeon: Pryor Ochoa, MD;  Location: [facility] OR;  Service: Vascular;  Laterality: N/A;  . VITRECTOMY Bilateral    Family History  Problem Relation Age of Onset  . Cancer Mother     Breast, NHL  . Stroke Mother   . Peripheral vascular  disease Father   . CAD Father 73  . Heart attack Father   . Hypertension Father   . Asthma Father   . Heart disease Father     before age 21   Social History:  reports that she quit smoking about 10 years ago. Her smoking use included Cigarettes. She has a 20.00 pack-year smoking history. She has never used smokeless tobacco. She reports that she does not drink alcohol or use drugs. Allergies  Allergen Reactions  . Ciprofloxacin Itching  . Epinephrine     Increased heart rate   Prior to Admission medications   Medication Sig Start Date End Date Taking? Authorizing Provider  acetaminophen (TYLENOL) 325 MG tablet Take 650 mg by mouth every 6 (six) hours as needed for mild pain.   Yes Historical Provider, MD  aspirin EC 81 MG tablet Take 81 mg by mouth daily.   Yes Historical Provider, MD  docusate sodium (COLACE) 100 MG capsule Take 100 mg by mouth daily as needed for mild constipation.   Yes Historical Provider, MD  hydrocerin (EUCERIN) CREA Apply 1 application topically 2 (two) times daily. 07/20/15  Yes Nishant Dhungel, MD  insulin aspart (NOVOLOG) 100 UNIT/ML injection Inject 6 Units into the skin 3 (three) times daily with meals. 07/05/14  Yes Zannie Cove, MD  insulin glargine (LANTUS) 100 UNIT/ML injection Inject 60 Units into the skin 2 (two) times daily.    Yes Historical Provider, MD  levonorgestrel (MIRENA) 20 MCG/24HR IUD 1 Intra Uterine Device (1 each total) by Intrauterine route once. 08/16/15  Yes Adolphus Birchwood, MD  levothyroxine (SYNTHROID, LEVOTHROID) 75 MCG tablet Take 75 mcg by mouth daily before breakfast.   Yes Historical Provider, MD  midodrine (PROAMATINE) 10 MG tablet Take 1 tablet (10 mg total) by mouth 2 (two) times daily with a meal. Patient taking differently: Take 10 mg by mouth every other day.  07/20/15  Yes Nishant Dhungel, MD  multivitamin (RENA-VIT) TABS tablet Take 1 tablet by mouth daily.   Yes Historical Provider, MD  OXYGEN 2lpm 24/7- [facility]   Yes Historical  Provider, MD  sodium chloride (OCEAN) 0.65 % SOLN nasal spray Place 1 spray into both nostrils as needed for congestion.   Yes Historical Provider, MD   Current Facility-Administered Medications  Medication Dose Route Frequency Provider Last Rate Last Dose  . 0.9 %  sodium chloride infusion  100 mL Intravenous PRN Pola Corn, NP      . 0.9 %  sodium chloride infusion  100 mL Intravenous PRN Pola Corn, NP      . acetaminophen (TYLENOL) tablet 650 mg  650 mg Oral Q6H PRN Marcos Eke, PA-C       Or  . acetaminophen (TYLENOL) suppository 650 mg  650 mg Rectal Q6H PRN Marcos Eke, PA-C      . alteplase (CATHFLO ACTIVASE) injection 2 mg  2 mg Intracatheter Once PRN Pola Corn, NP      .  aspirin EC tablet 81 mg  81 mg Oral Daily Marcos Eke, PA-C      . bisacodyl (DULCOLAX) suppository 10 mg  10 mg Rectal Daily PRN Marcos Eke, PA-C      . heparin injection 1,000 Units  1,000 Units Dialysis PRN Pola Corn, NP      . heparin injection 4,500 Units  4,500 Units Dialysis Once in dialysis Pola Corn, NP      . HYDROcodone-acetaminophen (NORCO/VICODIN) 5-325 MG per tablet 1-2 tablet  1-2 tablet Oral Q4H PRN Marcos Eke, PA-C      . insulin aspart (novoLOG) injection 0-9 Units  0-9 Units Subcutaneous TID WC Marcos Eke, PA-C      . insulin glargine (LANTUS) injection 25 Units  25 Units Subcutaneous BID Marcos Eke, PA-C      . iopamidol (ISOVUE-300) 61 % injection 100 mL  100 mL Intravenous Once PRN Azalia Bilis, MD      . Melene Muller ON 08/24/2016] levothyroxine (SYNTHROID, LEVOTHROID) tablet 75 mcg  75 mcg Oral QAC breakfast Marcos Eke, PA-C      . lidocaine (PF) (XYLOCAINE) 1 % injection 5 mL  5 mL Intradermal PRN Pola Corn, NP      . lidocaine-prilocaine (EMLA) cream 1 application  1 application Topical PRN Pola Corn, NP      . magnesium citrate solution 1 Bottle  1 Bottle Oral Once PRN Marcos Eke, PA-C      .  midodrine (PROAMATINE) 5 MG tablet           . [START ON 08/24/2016] midodrine (PROAMATINE) tablet 10 mg  10 mg Oral BID WC Marcos Eke, PA-C      . multivitamin (RENA-VIT) tablet 1 tablet  1 tablet Oral Daily Marcos Eke, PA-C      . ondansetron [facility]) tablet 4 mg  4 mg Oral Q6H PRN Marcos Eke, PA-C       Or  . ondansetron [facility]) injection 4 mg  4 mg Intravenous Q6H PRN Marcos Eke, PA-C      . pentafluoroprop-tetrafluoroeth (GEBAUERS) aerosol 1 application  1 application Topical PRN Pola Corn, NP      . Melene Muller ON 08/24/2016] piperacillin-tazobactam (ZOSYN) IVPB 3.375 g  3.375 g Intravenous Q12H Ozella Rocks, MD      . piperacillin-tazobactam (ZOSYN) IVPB 3.375 g  3.375 g Intravenous Once Ozella Rocks, MD      . senna-docusate (Senokot-S) tablet 1 tablet  1 tablet Oral QHS PRN Marcos Eke, PA-C      . sodium chloride flush (NS) 0.9 % injection 3 mL  3 mL Intravenous Q12H Marcos Eke, PA-C      . traZODone (DESYREL) tablet 25 mg  25 mg Oral QHS PRN Marcos Eke, PA-C       Current Outpatient Prescriptions  Medication Sig Dispense Refill  . acetaminophen (TYLENOL) 325 MG tablet Take 650 mg by mouth every 6 (six) hours as needed for mild pain.    Marland Kitchen aspirin EC 81 MG tablet Take 81 mg by mouth daily.    Marland Kitchen docusate sodium (COLACE) 100 MG capsule Take 100 mg by mouth daily as needed for mild constipation.    . hydrocerin (EUCERIN) CREA Apply 1 application topically 2 (two) times daily. 113 g 0  . insulin aspart (NOVOLOG) 100 UNIT/ML injection Inject 6 Units into the skin 3 (three) times daily with meals.    Marland Kitchen  insulin glargine (LANTUS) 100 UNIT/ML injection Inject 60 Units into the skin 2 (two) times daily.     Marland Kitchen. levonorgestrel (MIRENA) 20 MCG/24HR IUD 1 Intra Uterine Device (1 each total) by Intrauterine route once. 1 each 0  . levothyroxine (SYNTHROID, LEVOTHROID) 75 MCG tablet Take 75 mcg by mouth daily before breakfast.    . midodrine (PROAMATINE) 10 MG  tablet Take 1 tablet (10 mg total) by mouth 2 (two) times daily with a meal. (Patient taking differently: Take 10 mg by mouth every other day. ) 60 tablet 0  . multivitamin (RENA-VIT) TABS tablet Take 1 tablet by mouth daily.    . OXYGEN 2lpm 24/7- [facility]    . sodium chloride (OCEAN) 0.65 % SOLN nasal spray Place 1 spray into both nostrils as needed for congestion.     Labs: Basic Metabolic Panel:  Recent Labs Lab 08/23/16 0940  NA 123*  K >7.5*  CL 95*  CO2 10*  GLUCOSE 381*  BUN 86*  CREATININE 8.82*  CALCIUM 9.1   Liver Function Tests:  Recent Labs Lab 08/23/16 0940  AST 15  ALT 22  ALKPHOS 182*  BILITOT 0.8  PROT 8.8*  ALBUMIN 3.3*   CBC:  Recent Labs Lab 08/23/16 0940  WBC 31.0*  NEUTROABS 29.2*  HGB 10.9*  HCT 32.9*  MCV 102.2*  PLT 212   Studies/Results: Ct Abdomen Pelvis W Contrast  Result Date: 08/23/2016 CLINICAL DATA:  The sided abdominal pain EXAM: CT ABDOMEN AND PELVIS WITH CONTRAST TECHNIQUE: Multidetector CT imaging of the abdomen and pelvis was performed using the standard protocol following bolus administration of intravenous contrast. CONTRAST:  100mL ISOVUE-300 IOPAMIDOL (ISOVUE-300) INJECTION 61% COMPARISON:  None. FINDINGS: Lower chest: No acute abnormality. Hepatobiliary: The liver is diffusely fatty infiltrated. The gallbladder is within normal limits. Pancreas: Unremarkable. No pancreatic ductal dilatation or surrounding inflammatory changes. Spleen: Normal in size without focal abnormality. Adrenals/Urinary Tract: Adrenal glands are unremarkable. Kidneys are normal, without renal calculi, focal lesion, or hydronephrosis. Bladder is unremarkable. Stomach/Bowel: Stomach is within normal limits. Appendix appears normal. No evidence of bowel wall thickening, distention, or inflammatory changes. Vascular/Lymphatic: Aortic atherosclerosis. No enlarged abdominal or pelvic lymph nodes. Reproductive: IUD is noted in place. No other focal abnormality is  noted. Other: Some laxity of the anterior abdominal wall is noted although no true hernia is seen. No abdominopelvic ascites. Musculoskeletal: No acute or significant osseous findings. IMPRESSION: Chronic changes without acute abnormality. Electronically Signed   By: Alcide CleverMark  Lukens M.D.   On: 08/23/2016 10:56   Dg Chest Portable 1 View  Result Date: 08/23/2016 CLINICAL DATA:  Abdominal pain. EXAM: PORTABLE CHEST 1 VIEW COMPARISON:  07/10/2015 . FINDINGS: Left IJ dual-lumen catheter noted with tip projected over right atrium. Cardiomegaly with bilateral pulmonary interstitial prominence consistent with congestive heart failure. No pleural effusion or pneumothorax . IMPRESSION: 1. Left IJ dual-lumen catheter with tip projected over the right atrium. 2. Congestive heart failure with mild pulmonary interstitial edema. Electronically Signed   By: Maisie Fushomas  Register   On: 08/23/2016 10:00    ROS: As per HPI otherwise negative.   Physical Exam: Vitals:   08/23/16 1122 08/23/16 1130 08/23/16 1200 08/23/16 1225  BP: (!) 92/49 (!) 106/20 (!) 99/53 (!) (P) 108/43  Pulse: 78 80 91 (P) 89  Resp: 24 19 17  (P) 18  Temp:    (P) 97.8 F (36.6 C)  TempSrc:    (P) Oral  SpO2: 99% 98% 96% (P) 99%  Weight:  Height:         General: Chronically ill appearing obese female in mild respiratory distress.  Head: Normocephalic, atraumatic, sclera non-icteric, mucus membranes are dry.  Neck: Supple. Difficult to assess for JVD D/T anatomy but no overt JVD seen.  Lungs: Bilateral breath sounds very decreased in bases-very little air movement L lung fields. RR 24-30 with mild WOB present.  Heart: HS distant. S1,S2, RRR. SR with T wave elevation visible on heart monitor.  Abdomen: Soft, non-tender, non-distended with normoactive bowel sounds. No rebound/guarding. No obvious abdominal masses. Lower extremities: chronic LE edema with chronic cellulitis BLE. LLE warmer to touch with erythema prsent.   Neuro: Alert and  oriented X 3. Moves all extremities spontaneously. Psych:  Responds to questions appropriately with a normal affect. Dialysis Access:[facility] TDC blood lines connected at present  Dialysis Orders: [facility] T,Th,S 4.5 hours 400/auto 1.5  126.5 kg 2.0 K/2.25 Ca UF Profile 2  Linear Na Heparin 6000 units IV initial bolus/ Heparin 3000 units IV mid run Hectoral 2 mcg IV q treatment (last PTH 450 07/05/16) Mircera 50 mcg IV q 4 weeks (last dose 07/26/16 Last HGB 10.7 (08/09/16) Last Fe 96 Tsat 42% 08/01/16.    Assessment/Plan: 1.  Hyperkalemia: K+ 7.5 Emergent HD for hyperkalemia. Repeat K after 1 hour of treatment  On 1.0 K bath is now 4.4.  2. Volume Overload/SOB: On HD UFG 3.5. Max UF as tolerated. CXR early edema 3. Leukocytosis: WBC 31.0 Afebrile. Has been started on zosyn per primary ? Infected TDC. This is usually treated with vanc/cefepime. BC have been ordered-will collect on HD. Stool for C diff to be collected.  Unclear cause of ^^WBC, no fever, heavy diarrhea - could have Cdif.  HD cath infections are usually associated with high fevers.  4.  ESRD -  T,Th,S via [facility] TDC. Will have HD off schedule today and short HD tomorrow to get back on schedule.  5.  Hypotension/volume  - Unable to get pre-wt prior to HD. Wt on file is stated. UFG 3.5 today, currently having issues with hypotension as she said she could not take midodrine without sitting up. May not reach UFG goal. Takes midodrine 10 mg PO on HD days, Metoprolol 12.5 mg PO BID. This has not been ordered yet. 6.  Anemia  - HGB 10.9. No ESA needed. Follow HGB  7.  Metabolic bone disease - Patient has not been taking binders D/T inability to afford medication. Will order Ca acetate 667 mg 1 capsule PO TID, cont VDRA.  8.  Nutrition - NPO at present. Renal/Carb mod diet when able to eat. Albumin 3.3. Add nepro/renal vit.  9. DM: per primary 10. Hypothyroidism: per primary  Kimberly H. Manson PasseyBrown, NP-C 08/23/2016, 1:53 PM  [facility] Beeper [phone]  Pt seen, examined, agree w assess/plan as above with additions as indicated.  Kimberly Moselleob Desani Sprung MD [facility] pager [phone]    cell [phone] 08/23/2016, 4:32 PM

## 2016-08-23 NOTE — Progress Notes (Signed)
ANTIBIOTIC CONSULT NOTE - INITIAL  Pharmacy Consult for Rocephin Indication: infection of unknown source  Allergies  Allergen Reactions  . Ciprofloxacin Itching  . Epinephrine     Increased heart rate    Patient Measurements: Height: 5\' 4"  (162.6 cm) Weight: 279 lb 15.8 oz (127 kg) IBW/kg (Calculated) : 54.7 Adjusted Body Weight:   Vital Signs: Temp: 98.1 F (36.7 C) (12/13 1651) Temp Source: Oral (12/13 1651) BP: 116/72 (12/13 1651) Pulse Rate: 91 (12/13 1651) Intake/Output from previous day: No intake/output data recorded. Intake/Output from this shift: Total I/O In: 100 [IV Piggyback:100] Out: 0   Labs:  Recent Labs  08/23/16 0940 08/23/16 1732  WBC 31.0* 20.7*  HGB 10.9* 9.9*  PLT 212 165  CREATININE 8.82*  --    Estimated Creatinine Clearance: 8.8 mL/min (by C-G formula based on SCr of 8.82 mg/dL (H)). No results for input(s): VANCOTROUGH, VANCOPEAK, VANCORANDOM, GENTTROUGH, GENTPEAK, GENTRANDOM, TOBRATROUGH, TOBRAPEAK, TOBRARND, AMIKACINPEAK, AMIKACINTROU, AMIKACIN in the last 72 hours.   Microbiology: No results found for this or any previous visit (from the past 720 hour(s)).  Medical History: Past Medical History:  Diagnosis Date  . Anemia   . Anxiety   . Arthritis    "aches and pains all over" (04/06/2015)  . Asthma   . CAD, NATIVE VESSEL    May 10, 2010 cath showed a hyperdynamic LV function, she had dominant circumflex anatomy with a 70-80% small OM1. She had diffuse diabetic plaque particularly in the distal LAD. She nondominant RCA.  Nondominant  . Cellulitis 10/15/2013  . CHF (congestive heart failure) (HCC)    Preserved EF  . Chronic bronchitis (HCC)    "often; usually q yr" (04/06/2015)  . COPD   . Depression   . Diabetic neuropathy (HCC)   . End stage renal disease (HCC)    dialysis M-W-F  . Endometrial hyperplasia   . GERD (gastroesophageal reflux disease)   . History of hiatal hernia   . HYPERLIPIDEMIA   . HYPERTENSION   .  Hypothyroidism   . On home oxygen therapy    "2.5L; 24/7" (04/06/2015)  . Paralyzed vocal cords   . Peripheral neuropathy (HCC)    hx/notes 01/27/2010  . Pneumonia "several times"  . Proliferative retinopathy    hx/notes 01/27/2010  . PVD    CEA  . Sleep apnea    not on cpap    . 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 Type II diabetes mellitus (HCC)    Assessment: The dose of Rocephin varies based on indication: 2g IV q12h for CNS infections/enterococcal endocarditis. It is dose 1g IV q24h for other indications. This patient is prescribed Rocephin for infection of unkown source. Renal adjustment is not required for this medication.   Goal of Therapy:  Eradication of infection   Plan:  Rocephin 2g IV q24h  Kimberly [name] S. Merilynn Finlandobertson, PharmD, BCPS Clinical Staff Pharmacist Pager [phone]  Kimberly [name], Kimberly [name] 08/23/2016,6:02 PM

## 2016-08-23 NOTE — ED Triage Notes (Addendum)
Patient comes in per [facility] with c/o weakness and diarrhea since Saturday and LUQ pain and shortness of breath. Fell this AM. Denies LOC or hitting head. Patient is a MWF dialysis patient with L chest cath. Missed Monday dialysis d/t diarrhea. EMS ekg BBB. EMS v/s 132/102, 99 HR, 100% on 5L O2 mask. Patient chronically wears 2 L. Hx of DM, esrd, afib, chf. 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

## 2016-08-23 NOTE — ED Provider Notes (Signed)
[facility]-EMERGENCY DEPT Provider Note   CSN: [id number] Arrival date & time: 08/23/16  0827     History   Chief Complaint Chief Complaint  Patient presents with  . Abdominal Pain  . Diarrhea  . Weakness    HPI Kimberly [name] is a 60 y.o. female.  HPI Patient ports perfuse diarrhea over the past 4-5 days.  She has end-stage renal disease and dialyzes Monday Wednesday Friday.  Her last dialysis was last Friday.  She missed dialysis this Monday and this Wednesday.  She states today her diarrhea seems to slow down but she feels so weak and tired.  She is short of breath at rest.  She is concerned about additional fluid on her lungs.  She has increasing orthopnea.  She states she is so weak she can barely walk at this time.  She denies chest pain.  No back pain.  She no longer makes urine.  She reports new left-sided abdominal discomfort.  She denies blood in her stool.  She denies black-colored stool.  Her symptoms are moderate to severe in severity   Past Medical History:  Diagnosis Date  . Anemia   . Anxiety   . Arthritis    "aches and pains all over" (04/06/2015)  . Asthma   . CAD, NATIVE VESSEL    May 10, 2010 cath showed a hyperdynamic LV function, she had dominant circumflex anatomy with a 70-80% small OM1. She had diffuse diabetic plaque particularly in the distal LAD. She nondominant RCA.  Nondominant  . Cellulitis 10/15/2013  . CHF (congestive heart failure) (HCC)    Preserved EF  . Chronic bronchitis (HCC)    "often; usually q yr" (04/06/2015)  . COPD   . Depression   . Diabetic neuropathy (HCC)   . End stage renal disease (HCC)    dialysis M-W-F  . Endometrial hyperplasia   . GERD (gastroesophageal reflux disease)   . History of hiatal hernia   . HYPERLIPIDEMIA   . HYPERTENSION   . Hypothyroidism   . On home oxygen therapy    "2.5L; 24/7" (04/06/2015)  . Paralyzed vocal cords   . Peripheral neuropathy (HCC)    hx/notes 01/27/2010  . Pneumonia "several times"    . Proliferative retinopathy    hx/notes 01/27/2010  . PVD    CEA  . Sleep apnea    not on cpap    . 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 Type II diabetes mellitus [facility](HCC)     Patient Active Problem List   Diagnosis Date Noted  . Hyperkalemia 08/23/2016  . Hypotension arterial 09/30/2015  . LBBB (left bundle branch block) 09/30/2015  . Complex endometrial hyperplasia with atypia 09/02/2015  . Coronary atherosclerosis of native coronary artery 08/09/2015  . Severe obesity (HCC) 07/29/2015  . Acute respiratory failure with hypoxemia (HCC) 07/20/2015  . Hypotension 07/20/2015  . Uncontrolled type 2 diabetes mellitus (HCC) 07/20/2015  . Vaginal bleeding   . Altered mental status   . Pulmonary arterial hypertension   . Acute on chronic diastolic congestive heart failure (HCC)   . Paroxysmal atrial fibrillation (HCC) 07/11/2015  . ESRD (end stage renal disease) (HCC) 07/08/2015  . Diabetic ulcer of right foot associated with diabetes mellitus due to underlying condition (HCC)   . Pressure ulcer 04/07/2015  . Obesity hypoventilation syndrome (HCC) 08/05/2014  . Morbid obesity (HCC)  06/28/2014  . Physical deconditioning 06/28/2014  . Mitral valve stenosis, moderate 06/11/2014  . Hypothyroidism 06/10/2014  . Carotid stenosis 12/02/2013  . Varicose veins of lower extremities with other complications 12/02/2013  . Chronic respiratory failure with hypoxia (HCC) 10/13/2013  . On home oxygen therapy 10/13/2013  . Chronic diastolic CHF (congestive heart failure) (HCC) 09/12/2013  . CAD (coronary artery disease), native coronary artery   . DM (diabetes mellitus), type 2 with renal complications (HCC) 05/31/2010  . Hyperlipidemia associated with type 2 diabetes mellitus (HCC) 05/31/2010  . COPD (chronic obstructive pulmonary disease) (HCC) 05/31/2010  . Hypertensive heart  disease     Past Surgical History:  Procedure Laterality Date  . AV FISTULA PLACEMENT Right 07/08/2015   Procedure: EXPLORATION RIGHT AXILLARY ARTERY AND RIGHT BRACHIAL VEIN;  Surgeon: Chuck Hinthristopher S Dickson, MD;  [facility];  Service: Vascular;  Laterality: Right;  . CAROTID ENDARTERECTOMY Left X 2  . CATARACT EXTRACTION W/ INTRAOCULAR LENS  IMPLANT, BILATERAL Bilateral   . COLONOSCOPY    . EYE SURGERY     numerous surgeries  . INSERTION OF DIALYSIS CATHETER N/A 06/24/2015   Procedure: ULTRASOUND BILATERAL INTERNAL JUGULAR VEIN INSERTION OF DIALYSIS CATHETER LEFT INTERNAL JUGULAR VEIN ;  Surgeon: Pryor OchoaJames D Lawson, MD;  [facility];  Service: Vascular;  Laterality: N/A;  . VITRECTOMY Bilateral     OB History    No data available       Home Medications    Prior to Admission medications   Medication Sig Start Date End Date Taking? Authorizing Provider  acetaminophen (TYLENOL) 325 MG tablet Take 650 mg by mouth every 6 (six) hours as needed for mild pain.    Historical Provider, MD  aspirin EC 81 MG tablet Take 81 mg by mouth daily.    Historical Provider, MD  docusate sodium (COLACE) 100 MG capsule Take 100 mg by mouth daily as needed for mild constipation.    Historical Provider, MD  hydrocerin (EUCERIN) CREA Apply 1 application topically 2 (two) times daily. 07/20/15   Nishant Dhungel, MD  insulin aspart (NOVOLOG) 100 UNIT/ML injection Inject 6 Units into the skin 3 (three) times daily with meals. 07/05/14   Zannie CovePreetha Joseph, MD  insulin glargine (LANTUS) 100 UNIT/ML injection Inject 50 Units into the skin 2 (two) times daily.     Historical Provider, MD  levonorgestrel (MIRENA) 20 MCG/24HR IUD 1 Intra Uterine Device (1 each total) by Intrauterine route once. 08/16/15   Adolphus BirchwoodEmma Rossi, MD  levothyroxine (SYNTHROID, LEVOTHROID) 75 MCG tablet Take 75 mcg by mouth daily before breakfast.    Historical Provider, MD  midodrine (PROAMATINE) 10 MG tablet Take 1 tablet (10 mg total) by mouth  2 (two) times daily with a meal. 07/20/15   Nishant Dhungel, MD  multivitamin (RENA-VIT) TABS tablet Take 1 tablet by mouth daily.    Historical Provider, MD  OXYGEN 2lpm 24/7- [facility]    Historical Provider, MD    Family History Family History  Problem Relation Age of Onset  . Cancer Mother     Breast, NHL  . Stroke Mother   . Peripheral vascular disease Father   . CAD Father 5730  . Heart attack Father   . Hypertension Father   . Asthma Father   . Heart disease Father     before age 61    Social History Social History  Substance Use Topics  . Smoking status: Former Smoker    Packs/day: 1.00    Years: 20.00  Types: Cigarettes    Quit date: 09/11/2005  . Smokeless tobacco: Never Used  . Alcohol use No     Allergies   Ciprofloxacin and Epinephrine   Review of Systems Review of Systems  All other systems reviewed and are negative.    Physical Exam Updated Vital Signs BP (!) 106/20   Pulse 80   Temp 98.1 F (36.7 C) (Oral)   Resp 19   Ht 5\' 4"  (1.626 m)   Wt 277 lb (125.6 kg)   SpO2 98%   BMI 47.55 kg/m   Physical Exam  Constitutional: She is oriented to person, place, and time. She appears well-developed. No distress.  Ill-appearing  HENT:  Head: Normocephalic and atraumatic.  Eyes: EOM are normal.  Neck: Normal range of motion.  Cardiovascular: Regular rhythm and normal heart sounds.   Tachycardic  Pulmonary/Chest:  Tachypnea.  Rales.  Abdominal: Soft. She exhibits no distension.  Left-sided abdominal tenderness.  No guarding or rebound.  Musculoskeletal: Normal range of motion.  Neurological: She is alert and oriented to person, place, and time.  Skin: Skin is warm and dry.  Psychiatric: She has a normal mood and affect. Judgment normal.  Nursing note and vitals reviewed.    ED Treatments / Results  Labs (all labs ordered are listed, but only abnormal results are displayed) Labs Reviewed  CBC WITH DIFFERENTIAL/PLATELET - Abnormal; Notable for  the following:       Result Value   WBC 31.0 (*)    RBC 3.22 (*)    Hemoglobin 10.9 (*)    HCT 32.9 (*)    MCV 102.2 (*)    RDW 16.7 (*)    Neutro Abs 29.2 (*)    Lymphs Abs 0.6 (*)    Monocytes Absolute 1.2 (*)    All other components within normal limits  COMPREHENSIVE METABOLIC PANEL - Abnormal; Notable for the following:    Sodium 123 (*)    Potassium >7.5 (*)    Chloride 95 (*)    CO2 10 (*)    Glucose, Bld 381 (*)    BUN 86 (*)    Creatinine, Ser 8.82 (*)    Total Protein 8.8 (*)    Albumin 3.3 (*)    Alkaline Phosphatase 182 (*)    GFR calc non Af Amer 4 (*)    GFR calc Af Amer 5 (*)    Anion gap 18 (*)    All other components within normal limits  C DIFFICILE QUICK SCREEN W PCR REFLEX    EKG  EKG Interpretation  Date/Time:  Wednesday August 23 2016 09:54:31 EST Ventricular Rate:  77 PR Interval:    QRS Duration: 184 QT Interval:  447 QTC Calculation: 506 R Axis:   -34 Text Interpretation:  Sinus rhythm Left bundle branch block peaked t waves new since prior tracing Confirmed by Zimere Dunlevy  MD, Caryn Bee ([id number]) on 08/23/2016 10:31:28 AM       Radiology Ct Abdomen Pelvis W Contrast  Result Date: 08/23/2016 CLINICAL DATA:  The sided abdominal pain EXAM: CT ABDOMEN AND PELVIS WITH CONTRAST TECHNIQUE: Multidetector CT imaging of the abdomen and pelvis was performed using the standard protocol following bolus administration of intravenous contrast. CONTRAST:  ISOVUE-300 IOPAMIDOL (ISOVUE-300) INJECTION 61% COMPARISON:  None. FINDINGS: Lower chest: No acute abnormality. Hepatobiliary: The liver is diffusely fatty infiltrated. The gallbladder is within normal limits. Pancreas: Unremarkable. No pancreatic ductal dilatation or surrounding inflammatory changes. Spleen: Normal in size without focal abnormality. Adrenals/Urinary Tract: Adrenal  glands are unremarkable. Kidneys are normal, without renal calculi, focal lesion, or hydronephrosis. Bladder is unremarkable.  Stomach/Bowel: Stomach is within normal limits. Appendix appears normal. No evidence of bowel wall thickening, distention, or inflammatory changes. Vascular/Lymphatic: Aortic atherosclerosis. No enlarged abdominal or pelvic lymph nodes. Reproductive: IUD is noted in place. No other focal abnormality is noted. Other: Some laxity of the anterior abdominal wall is noted although no true hernia is seen. No abdominopelvic ascites. Musculoskeletal: No acute or significant osseous findings. IMPRESSION: Chronic changes without acute abnormality. Electronically Signed   By: Alcide Clever M.D.   On: 08/23/2016 10:56   Dg Chest Portable 1 View  Result Date: 08/23/2016 CLINICAL DATA:  Abdominal pain. EXAM: PORTABLE CHEST 1 VIEW COMPARISON:  07/10/2015 . FINDINGS: Left IJ dual-lumen catheter noted with tip projected over right atrium. Cardiomegaly with bilateral pulmonary interstitial prominence consistent with congestive heart failure. No pleural effusion or pneumothorax . IMPRESSION: 1. Left IJ dual-lumen catheter with tip projected over the right atrium. 2. Congestive heart failure with mild pulmonary interstitial edema. Electronically Signed   By: Maisie Fus  Register   On: 08/23/2016 10:00    Procedures Procedures (including critical care time)    +++++++++++++++++++++++++++++++++++++++++++++++++++++++++++++  CRITICAL CARE Performed by: Lyanne Co Total critical care time: 31 minutes Critical care time was exclusive of separately billable procedures and treating other patients. Critical care was necessary to treat or prevent imminent or life-threatening deterioration. Critical care was time spent personally by me on the following activities: development of treatment plan with patient and/or surrogate as well as nursing, discussions with consultants, evaluation of patient's response to treatment, examination of patient, obtaining history from patient or surrogate, ordering and performing treatments and  interventions, ordering and review of laboratory studies, ordering and review of radiographic studies, pulse oximetry and re-evaluation of patient's condition.  +++++++++++++++++++++++++++++++++++++++++++++++++++++    Medications Ordered in ED Medications  iopamidol (ISOVUE-300) 61 % injection 100 mL (not administered)  calcium gluconate 1 g in sodium chloride 0.9 % 100 mL IVPB (0 g Intravenous Stopped 08/23/16 1023)  albuterol (PROVENTIL) (2.5 MG/3ML) 0.083% nebulizer solution 5 mg (5 mg Nebulization Given 08/23/16 1118)  insulin aspart (novoLOG) injection 10 Units (10 Units Intravenous Given 08/23/16 1118)  dextrose 50 % solution 50 mL (50 mLs Intravenous Given 08/23/16 1118)  iopamidol (ISOVUE-300) 61 % injection (100 mLs  Contrast Given 08/23/16 1034)     Initial Impression / Assessment and Plan / ED Course  I have reviewed the triage vital signs and the nursing notes.  Pertinent labs & imaging results that were available during my care of the patient were reviewed by me and considered in my medical decision making (see chart for details).  Clinical Course     Severe hyperkalemia with junctional rhythm.  Patient given calcium on arrival.  Additional medications for hyperkalemia given.  Patient with profuse diarrhea.  C. difficile will be ordered.  Diarrhea sounds as though it is slowing down.  Patient will be taken for urgent/emergent dialysis.  CT abdomen pelvis demonstrates no left-sided colonic issues.  No signs of diverticulitis or colitis.  Spaces be viral diarrhea.  She has signs of volume overload this time.  We will continue to monitor closely while in the emergency department for arrhythmias.  Final Clinical Impressions(s) / ED Diagnoses   Final diagnoses:  Hyperkalemia  Diarrhea, unspecified type    New Prescriptions New Prescriptions   No medications on file     Azalia Bilis, MD 08/23/16 1139

## 2016-08-23 NOTE — Procedures (Signed)
  I was present at this dialysis session, have reviewed the session itself and made  appropriate changes Vinson Moselleob Kimberly Kruczek MD [facility] pager [phone]   08/23/2016, 4:36 PM

## 2016-08-23 NOTE — ED Notes (Signed)
Attempted IV x1 unsuccessfully.

## 2016-08-23 NOTE — Progress Notes (Signed)
Patient arrived on unit via stretcher from hemodialysis.  Telemetry placed per MD order and CMT notified. No family at bedside.

## 2016-08-23 NOTE — Progress Notes (Signed)
Pharmacy Antibiotic Note  Kimberly [name] is a 60 y.o. female admitted on 08/23/2016 with SOB, LLQ pain, and diarrhea. .  Pharmacy has been consulted for Zosyn dosing for a possible intra-abdominal infection.  Of note, she is a hemodialysis patient with TThSa sessions, who missed her most recent Saturday session. She is afebrile with an elevated wbc of 31.   Plan: Zosyn 3.375 g IV every 12 hours Monitor clinical improvement and HD schedule Follow-up length of therapy and blood cultures   Height: 5\' 4"  (162.6 cm) Weight: 277 lb (125.6 kg) IBW/kg (Calculated) : 54.7  Temp (24hrs), Avg:98.1 F (36.7 C), Min:98.1 F (36.7 C), Max:98.1 F (36.7 C)   Recent Labs Lab 08/23/16 0940  WBC 31.0*  CREATININE 8.82*    Estimated Creatinine Clearance: 8.8 mL/min (by C-G formula based on SCr of 8.82 mg/dL (H)).    Allergies  Allergen Reactions  . Ciprofloxacin Itching  . Epinephrine     Increased heart rate    Antimicrobials this admission: Zosyn 12/13 >>   Dose adjustments this admission: None  Microbiology results: 12/13 BCx: pending  Thank you for allowing pharmacy to be a part of this patient's care.  Allie BossierApryl Anderson, PharmD PGY1 Pharmacy Resident [phone] (Pager) 08/23/2016 1:29 PM

## 2016-08-24 ENCOUNTER — Inpatient Hospital Stay (HOSPITAL_COMMUNITY): Payer: Medicare Other

## 2016-08-24 DIAGNOSIS — Z22322 Carrier or suspected carrier of Methicillin resistant Staphylococcus aureus: Secondary | ICD-10-CM

## 2016-08-24 DIAGNOSIS — R5381 Other malaise: Secondary | ICD-10-CM

## 2016-08-24 DIAGNOSIS — E039 Hypothyroidism, unspecified: Secondary | ICD-10-CM

## 2016-08-24 DIAGNOSIS — I110 Hypertensive heart disease with heart failure: Secondary | ICD-10-CM

## 2016-08-24 DIAGNOSIS — J9611 Chronic respiratory failure with hypoxia: Secondary | ICD-10-CM

## 2016-08-24 LAB — RENAL FUNCTION PANEL
Albumin: 2.6 g/dL — ABNORMAL LOW (ref 3.5–5.0)
Anion gap: 17 — ABNORMAL HIGH (ref 5–15)
BUN: 40 mg/dL — ABNORMAL HIGH (ref 6–20)
CO2: 19 mmol/L — ABNORMAL LOW (ref 22–32)
Calcium: 8.6 mg/dL — ABNORMAL LOW (ref 8.9–10.3)
Chloride: 96 mmol/L — ABNORMAL LOW (ref 101–111)
Creatinine, Ser: 5.31 mg/dL — ABNORMAL HIGH (ref 0.44–1.00)
GFR calc Af Amer: 9 mL/min — ABNORMAL LOW (ref >60)
GFR calc non Af Amer: 8 mL/min — ABNORMAL LOW (ref >60)
Glucose, Bld: 116 mg/dL — ABNORMAL HIGH (ref 65–99)
Phosphorus: 6.4 mg/dL — ABNORMAL HIGH (ref 2.5–4.6)
Potassium: 5.3 mmol/L — ABNORMAL HIGH (ref 3.5–5.1)
Sodium: 132 mmol/L — ABNORMAL LOW (ref 135–145)

## 2016-08-24 LAB — COMPREHENSIVE METABOLIC PANEL
ALBUMIN: 2.5 g/dL — AB (ref 3.5–5.0)
ALK PHOS: 156 U/L — AB (ref 38–126)
ALT: 16 U/L (ref 14–54)
ANION GAP: 12 (ref 5–15)
AST: 13 U/L — ABNORMAL LOW (ref 15–41)
BUN: 37 mg/dL — ABNORMAL HIGH (ref 6–20)
CALCIUM: 8.5 mg/dL — AB (ref 8.9–10.3)
CO2: 23 mmol/L (ref 22–32)
Chloride: 96 mmol/L — ABNORMAL LOW (ref 101–111)
Creatinine, Ser: 5.14 mg/dL — ABNORMAL HIGH (ref 0.44–1.00)
GFR calc non Af Amer: 8 mL/min — ABNORMAL LOW (ref >60)
GFR, EST AFRICAN AMERICAN: 10 mL/min — AB (ref >60)
Glucose, Bld: 133 mg/dL — ABNORMAL HIGH (ref 65–99)
POTASSIUM: 5.1 mmol/L (ref 3.5–5.1)
SODIUM: 131 mmol/L — AB (ref 135–145)
Total Bilirubin: 0.6 mg/dL (ref 0.3–1.2)
Total Protein: 7.4 g/dL (ref 6.5–8.1)

## 2016-08-24 LAB — GLUCOSE, CAPILLARY
GLUCOSE-CAPILLARY: 83 mg/dL (ref 65–99)
GLUCOSE-CAPILLARY: 96 mg/dL (ref 65–99)
Glucose-Capillary: 98 mg/dL (ref 65–99)

## 2016-08-24 LAB — POCT I-STAT, CHEM 8
BUN: 44 mg/dL — AB (ref 6–20)
Calcium, Ion: 1.19 mmol/L (ref 1.15–1.40)
Chloride: 96 mmol/L — ABNORMAL LOW (ref 101–111)
Creatinine, Ser: 5.1 mg/dL — ABNORMAL HIGH (ref 0.44–1.00)
Glucose, Bld: 214 mg/dL — ABNORMAL HIGH (ref 65–99)
HCT: 31 % — ABNORMAL LOW (ref 36.0–46.0)
HEMOGLOBIN: 10.5 g/dL — AB (ref 12.0–15.0)
Potassium: 4.3 mmol/L (ref 3.5–5.1)
SODIUM: 132 mmol/L — AB (ref 135–145)
TCO2: 22 mmol/L (ref 0–100)

## 2016-08-24 LAB — CBC
HCT: 28.9 % — ABNORMAL LOW (ref 36.0–46.0)
HCT: 30.3 % — ABNORMAL LOW (ref 36.0–46.0)
HEMOGLOBIN: 9.3 g/dL — AB (ref 12.0–15.0)
Hemoglobin: 10 g/dL — ABNORMAL LOW (ref 12.0–15.0)
MCH: 32.7 pg (ref 26.0–34.0)
MCH: 33.8 pg (ref 26.0–34.0)
MCHC: 32.2 g/dL (ref 30.0–36.0)
MCHC: 33 g/dL (ref 30.0–36.0)
MCV: 101.8 fL — ABNORMAL HIGH (ref 78.0–100.0)
MCV: 102.4 fL — ABNORMAL HIGH (ref 78.0–100.0)
Platelets: 162 10*3/uL (ref 150–400)
Platelets: 159 K/uL (ref 150–400)
RBC: 2.84 MIL/uL — AB (ref 3.87–5.11)
RBC: 2.96 MIL/uL — ABNORMAL LOW (ref 3.87–5.11)
RDW: 16.8 % — ABNORMAL HIGH (ref 11.5–15.5)
RDW: 17.1 % — ABNORMAL HIGH (ref 11.5–15.5)
WBC: 16.1 10*3/uL — ABNORMAL HIGH (ref 4.0–10.5)
WBC: 18.3 K/uL — ABNORMAL HIGH (ref 4.0–10.5)

## 2016-08-24 LAB — HEMOGLOBIN A1C
HEMOGLOBIN A1C: 9.9 % — AB (ref 4.8–5.6)
Mean Plasma Glucose: 237 mg/dL

## 2016-08-24 MED ORDER — HEPARIN SODIUM (PORCINE) 5000 UNIT/ML IJ SOLN
5000.0000 [IU] | Freq: Three times a day (TID) | INTRAMUSCULAR | Status: DC
  Administered 2016-08-24 – 2016-08-31 (×13): 5000 [IU] via SUBCUTANEOUS
  Filled 2016-08-24 (×15): qty 1

## 2016-08-24 MED ORDER — MIDODRINE HCL 5 MG PO TABS
ORAL_TABLET | ORAL | Status: AC
  Administered 2016-08-24: 10 mg via ORAL
  Filled 2016-08-24: qty 2

## 2016-08-24 MED ORDER — HEPARIN SODIUM (PORCINE) 1000 UNIT/ML DIALYSIS
4500.0000 [IU] | INTRAMUSCULAR | Status: DC | PRN
  Administered 2016-08-24: 4500 [IU] via INTRAVENOUS_CENTRAL
  Filled 2016-08-24 (×2): qty 5

## 2016-08-24 MED ORDER — SODIUM CHLORIDE 0.9 % IV SOLN: 100.0000 mL | INTRAVENOUS | Status: DC | PRN

## 2016-08-24 NOTE — Progress Notes (Signed)
PT Cancellation Note  Patient Details Name: Kimberly ManilaSusan K Mastrangelo MRN: [id number] DOB: 1956-03-26   Cancelled Treatment:    Reason Eval/Treat Not Completed: Patient's level of consciousness;Other (comment) (pt recently back from HD and can not wake up from sleep).  Will see pt 12/15 as able. 08/24/2016  Burnett BingKen Adrien Shankar, PT [phone]  (pager)   Eliseo GumKenneth V Devontae Casasola 08/24/2016, 4:37 PM

## 2016-08-24 NOTE — Progress Notes (Signed)
Dialysis treatment completed.  3500 mL ultrafiltrated.  3000 mL net fluid removal.  Patient status unchanged. Lung sounds diminished to ausculation in all fields. Generalized BLE edema. Cardiac: NSR.  Cleansed LIJ catheter with chlorhexidine.  Disconnected lines and flushed ports with saline per protocol.  Ports locked with heparin and capped per protocol.    Report given to bedside, RN Lurena Joinerebecca.

## 2016-08-24 NOTE — Progress Notes (Signed)
[facility] Progress Note   Subjective: diarrhea better, no fluid off last HD due to low BP's. Says lower legs are "always red".    Vitals:   08/24/16 0956 08/24/16 1053 08/24/16 1107 08/24/16 1130  BP: (!) 85/20 (!) 86/40 (!) 111/34 (!) 110/42  Pulse:  83 92 90  Resp:  (!) 8    Temp:  98 F (36.7 C)    TempSrc:      SpO2:      Weight:  127 kg (279 lb 15.8 oz)    Height:        Inpatient medications: . aspirin EC  81 mg Oral Daily  . Chlorhexidine Gluconate Cloth  6 each Topical Q0600  . heparin  4,500 Units Dialysis Once in dialysis  . insulin aspart  0-9 Units Subcutaneous TID WC  . insulin glargine  25 Units Subcutaneous BID  . levothyroxine  75 mcg Oral QAC breakfast  . mouth rinse  15 mL Mouth Rinse BID  . metoCLOPramide (REGLAN) injection  10 mg Intravenous Q12H  . midodrine  10 mg Oral Q T,Th,Sa-HD  . multivitamin  1 tablet Oral QHS  . mupirocin ointment  1 application Nasal BID  . piperacillin-tazobactam (ZOSYN)  IV  3.375 g Intravenous Q12H  . sodium chloride flush  3 mL Intravenous Q12H    sodium chloride, sodium chloride, sodium chloride, sodium chloride, acetaminophen **OR** acetaminophen, alteplase, bisacodyl, heparin, heparin, HYDROcodone-acetaminophen, iopamidol, lidocaine (PF), lidocaine-prilocaine, magnesium citrate, ondansetron **OR** ondansetron (ZOFRAN) IV, pentafluoroprop-tetrafluoroeth, senna-docusate, traZODone  Exam: Nasal O2, morbid obes, no distress No jvd Chest clear bilat, occ coughing RRR no mrg Abd obese soft ntnd Ext chronic bilat severe woody edema w lichenification and bright pink confluent erythema bilat below the knees NF, Ox 3, gen'd weakness  [facility]  4.[facility]   126.5kg  2/2.25 bath  P2   Linear Na  Hep 6000 then 3000 mid   L IJ cath H - 2 ug tiw, last pth450 M- 50 ug q 4 wks last 11/15      Assessment: 1. Diarrhea/ Reina Fuse^^WBC - improving, on IV zosyn 2. SOB/ pulm edema by CXR - not in distress, attempt UF w  HD today, didn't get much last HD 3. Chronic hypotension - on midodrine 4. COPD 5. ESRD HD TTS 6. Anemia no esa needed 7. MBD CKD - can't afford binders, phoslo here, cont vdra w hd 8. DM per primary  Plan - HD today, lower BP threshold >70, midodrine, get vol down, lower dry   Vinson Moselleob Noma Quijas MD [facility] pager [phone]   08/24/2016, 12:06 PM    Recent Labs Lab 08/23/16 1732 08/24/16 0329 08/24/16 0908  NA 132* 131* 132*  K 4.4 5.1 5.3*  CL 97* 96* 96*  CO2 19* 23 19*  GLUCOSE 148* 133* 116*  BUN 34* 37* 40*  CREATININE 4.43* 5.14* 5.31*  CALCIUM 8.3* 8.5* 8.6*  PHOS 5.0*  --  6.4*    Recent Labs Lab 08/23/16 0940 08/23/16 1732 08/24/16 0329 08/24/16 0908  AST 15  --  13*  --   ALT 22  --  16  --   ALKPHOS 182*  --  156*  --   BILITOT 0.8  --  0.6  --   PROT 8.8*  --  7.4  --   ALBUMIN 3.3* 2.7* 2.5* 2.6*    Recent Labs Lab 08/23/16 0940  08/23/16 1732 08/24/16 0329 08/24/16 0908  WBC 31.0*  --  20.7* 16.1*  18.3*  NEUTROABS 29.2*  --   --   --   --   HGB 10.9*  < > 9.9* 9.3* 10.0*  HCT 32.9*  < > 30.0* 28.9* 30.3*  MCV 102.2*  --  100.3* 101.8* 102.4*  PLT 212  --  165 162 159  < > = values in this interval not displayed. Iron/TIBC/Ferritin/ %Sat    Component Value Date/Time   IRON 45 04/13/2015 0553   TIBC 298 04/13/2015 0553   FERRITIN 98 04/13/2015 0553   IRONPCTSAT 15 04/13/2015 0553

## 2016-08-24 NOTE — Progress Notes (Signed)
Inpatient Diabetes Program Recommendations  AACE/ADA: New Consensus Statement on Inpatient Glycemic Control (2015)  Target Ranges:  Prepandial:   less than 140 mg/dL      Peak postprandial:   less than 180 mg/dL (1-2 hours)      Critically ill patients:  140 - 180 mg/dL   Lab Results  Component Value Date   GLUCAP 119 (H) 08/23/2016   HGBA1C 9.9 (H) 08/23/2016    Review of Glycemic Control  Results for [name], Latrina K (MRN [id number]) as of 08/24/2016 08:35  Ref. Range 07/20/2015 06:14 07/20/2015 11:29 08/23/2016 16:48 08/23/2016 21:36 08/24/2016 08:07  Glucose-Capillary Latest Ref Range: 65 - 99 mg/dL 409237 (H) 811259 (H) 914137 (H) 119 (H) 83    Diabetes history: Type 2 Outpatient Diabetes medications: Lantus 25 units bid, Novolog 6 units with meals Current orders for Inpatient glycemic control: Lantus 25 units bid, Novolog 0-9 units tid  Inpatient Diabetes Program Recommendations: Consider decreasing Lantus to 13 units bid (50% decrease from home dose)  Susette RacerJulie Aeron Lheureux, RN, BA, [place]HA, CDE Diabetes Coordinator Inpatient Diabetes Program  [phone] (Team Pager) [phone] [facility] Office) 08/24/2016 8:37 AM

## 2016-08-24 NOTE — Progress Notes (Signed)
   08/24/16 1100  OT Visit Information  Last OT Received On 08/24/16  Reason Eval/Treat Not Completed Patient at procedure or test/ unavailable;Other (comment) (HD)   Will reattempt OT eval as able  Raynald KempKathryn Nicolas Sisler OTR/L Pager: [phone]

## 2016-08-24 NOTE — Progress Notes (Signed)
Patient arrived to unit by bed.  Reviewed treatment plan and this RN agrees with plan.  Report received from bedside RN, Lurena Joinerebecca.  Consent verified.  Patient A & o X 4.   Lung sounds diminished to ausculation in all fields. BLE 3+ pitting edema. Cardiac:  NSR, BBB.  Removed caps and cleansed LIJ catheter with chlorhedxidine.  Aspirated ports of heparin and flushed them with saline per protocol.  Connected and secured lines, initiated treatment at 1107.  UF Goal of 2500mL and net fluid removal 2L.  Will continue to monitor.

## 2016-08-24 NOTE — Progress Notes (Signed)
Progress Note    Kimberly [name]  ZOX:[id number] DOB: 01-09-1956  DOA: 08/23/2016 PCP: Ginette OttoSTONEKING,[name] THOMAS, MD    Brief Narrative:   Chief complaint: Follow-up weakness, shortness of breath  Kimberly [name] is an 60 y.o. female with a PMH of end-stage renal disease on HD, COPD, hypertension, CAD and type 2 diabetes who was admitted 08/23/16 with a chief complaint of shortness of breath, left lower quadrant pain and diarrhea. Of note, she missed her last HD treatment secondary to the diarrhea. Upon initial evaluation in the ED, chest x-ray showed mild CHF. CT of the abdomen and pelvis was negative for acute changes.  Assessment/Plan:   Principal problem:  Acute on chronic hypoxic respiratory failure, multifactorial Most  likely secondary to flluid overload after missing HD last Saturday (T/Th/S)  . History of CHF and COPD on O2 at home  CXR showed cardiomegaly, CHF and mild pulmonary interstitial edema on admission but no frank pulmonary edema after dialysis. 2D echo 06/2015 EF 55 to 60%, normal LV function, grade 2 diastolic dysfunction.   Leukocytosis WBC 31, likely related to underlying infection  including of GI source versus line infection. WBC trending down. Recent diarrhea with abdominal pain.  CXR not indicative of infiltrative process.  Remains afebrile. CT abdomen and pelvis negative for acute findings. Follow-up blood cultures. Continue empiric Zosyn. Discontinue Rocephin as it is not adding any additional coverage.  Active problems:  End-stage renal disease HD per nephrology.   Atrial Fibrillation  CHA2DS2-VASc score 6, not on anticoagulation due to prior history of vaginal bleed. Rate controlled. Continue aspirin.  Anemia of chronic disease  Hemoglobin on admission 10.9 which is consistent with her usual baseline.    Type II Diabetes, uncontrolled, with complications, long-term insulin use Hemoglobin A1c 9.9% indicating suboptimal outpatient control. Currently being  managed with 25 units of Lantus twice a day and insulin sensitive sliding scale 3 times a day. CBGs 83-137.  Hypothyroidism: Continue home Synthroid.  History of Orthostatic hypotension  Continue midodrine every other day.   Physical Deconditioning OT/PT evaluations requested.  MRSA carrier Contact precautions. Decontamination therapy ordered.   Family Communication/Anticipated D/C date and plan/Code Status   DVT prophylaxis: Heparin ordered. Code Status: Full Code.  Family Communication: No family at the bedside. Disposition Plan: Home in the next 24-48 hours.   Medical Consultants:    Nephrology   Procedures:    None  Anti-Infectives:    Zosyn 08/23/16--->  Rocephin 08/23/16---> 08/24/16  Subjective:   The patient reports that she has not had any diarrhea x 4 days, no current nausea or vomiting, tolerating diet. Review of symptoms is positive for mild shortness of breath (but improved) and weakness of the lower legs.  Objective:    Vitals:   08/23/16 1530 08/23/16 1651 08/23/16 2054 08/24/16 0358  BP: (!) 99/16 116/72 (!) 94/26 (!) 130/41  Pulse: 94 91 87 85  Resp:  18 (!) 23 (!) 21  Temp:  98.1 F (36.7 C) 98.6 F (37 C) 98.9 F (37.2 C)  TempSrc:  Oral    SpO2:  100% 97% 98%  Weight:  127 kg (279 lb 15.8 oz)  127 kg (279 lb 15.8 oz)  Height:  5\' 4"  (1.626 m)      Intake/Output Summary (Last 24 hours) at 08/24/16 0839 Last data filed at 08/24/16 0600  Gross per 24 hour  Intake              320 ml  Output                0 ml  Net              320 ml   Filed Weights   08/23/16 1225 08/23/16 1651 08/24/16 0358  Weight: 125.6 kg (276 lb 14.4 oz) 127 kg (279 lb 15.8 oz) 127 kg (279 lb 15.8 oz)    Exam: General exam: Appears calm and comfortable. Morbidly obese. Respiratory system: Clear to auscultation, diminished in the bases. Respiratory effort normal. Cardiovascular system: S1 & S2 heard, RRR. No JVD,  rubs, gallops or clicks. No  murmurs. Gastrointestinal system: Abdomen is nondistended, soft and nontender. No organomegaly or masses felt. Normal bowel sounds heard. Central nervous system: Alert and oriented. No focal neurological deficits. Extremities: As pictured below. Skin: Warm and dry with chronic venous stasis dermatitis. Psychiatry: Judgement and insight appear Mildly impaired. Mood & affect flat.      Data Reviewed:   I have personally reviewed following labs and imaging studies:  Labs: Basic Metabolic Panel:  Recent Labs Lab 08/23/16 0940 08/23/16 1732 08/24/16 0329  NA 123* 132* 131*  K >7.5* 4.4 5.1  CL 95* 97* 96*  CO2 10* 19* 23  GLUCOSE 381* 148* 133*  BUN 86* 34* 37*  CREATININE 8.82* 4.43* 5.14*  CALCIUM 9.1 8.3* 8.5*  PHOS  --  5.0*  --    GFR Estimated Creatinine Clearance: 15.2 mL/min (by C-G formula based on SCr of 5.14 mg/dL (H)). Liver Function Tests:  Recent Labs Lab 08/23/16 0940 08/23/16 1732 08/24/16 0329  AST 15  --  13*  ALT 22  --  16  ALKPHOS 182*  --  156*  BILITOT 0.8  --  0.6  PROT 8.8*  --  7.4  ALBUMIN 3.3* 2.7* 2.5*   CBC:  Recent Labs Lab 08/23/16 0940 08/23/16 1732 08/24/16 0329  WBC 31.0* 20.7* 16.1*  NEUTROABS 29.2*  --   --   HGB 10.9* 9.9* 9.3*  HCT 32.9* 30.0* 28.9*  MCV 102.2* 100.3* 101.8*  PLT 212 165 162   CBG:  Recent Labs Lab 08/23/16 1648 08/23/16 2136 08/24/16 0807  GLUCAP 137* 119* 83   Hgb A1c:  Recent Labs  08/23/16 1300  HGBA1C 9.9*   Microbiology Recent Results (from the past 240 hour(s))  MRSA PCR Screening     Status: Abnormal   Collection Time: 08/23/16  5:04 PM  Result Value Ref Range Status   MRSA by PCR POSITIVE (A) NEGATIVE Final    Comment:        The GeneXpert MRSA Assay (FDA approved for NASAL specimens only), is one component of a comprehensive MRSA colonization surveillance program. It is not intended to diagnose MRSA infection nor to guide or monitor treatment for MRSA  infections. RESULT CALLED TO, READ BACK BY AND VERIFIED WITH: M.GABOR,RN AT 2347 BY L.PITT 08/23/16     Radiology: X-ray Chest Pa And Lateral  Result Date: 08/24/2016 CLINICAL DATA:  Cough, shortness of Breath EXAM: CHEST  2 VIEW COMPARISON:  08/23/2016 FINDINGS: Cardiomegaly again noted. Limited study by patient's large body habitus. No gross infiltrate or pulmonary edema. Haziness in left midlung field most likely from overlying breast tissue. Stable double lumen left IJ catheter with tip in right atrium. IMPRESSION: Limited study by patient's large body habitus. No gross infiltrate or pulmonary edema. Haziness in left midlung field most likely from overlying breast tissue. Stable double lumen left IJ catheter with tip in right  atrium. Electronically Signed   By: Natasha MeadLiviu  Pop M.D.   On: 08/24/2016 08:08   Ct Abdomen Pelvis W Contrast  Result Date: 08/23/2016 CLINICAL DATA:  The sided abdominal pain EXAM: CT ABDOMEN AND PELVIS WITH CONTRAST TECHNIQUE: Multidetector CT imaging of the abdomen and pelvis was performed using the standard protocol following bolus administration of intravenous contrast. CONTRAST:  100mL ISOVUE-300 IOPAMIDOL (ISOVUE-300) INJECTION 61% COMPARISON:  None. FINDINGS: Lower chest: No acute abnormality. Hepatobiliary: The liver is diffusely fatty infiltrated. The gallbladder is within normal limits. Pancreas: Unremarkable. No pancreatic ductal dilatation or surrounding inflammatory changes. Spleen: Normal in size without focal abnormality. Adrenals/Urinary Tract: Adrenal glands are unremarkable. Kidneys are normal, without renal calculi, focal lesion, or hydronephrosis. Bladder is unremarkable. Stomach/Bowel: Stomach is within normal limits. Appendix appears normal. No evidence of bowel wall thickening, distention, or inflammatory changes. Vascular/Lymphatic: Aortic atherosclerosis. No enlarged abdominal or pelvic lymph nodes. Reproductive: IUD is noted in place. No other focal  abnormality is noted. Other: Some laxity of the anterior abdominal wall is noted although no true hernia is seen. No abdominopelvic ascites. Musculoskeletal: No acute or significant osseous findings. IMPRESSION: Chronic changes without acute abnormality. Electronically Signed   By: Alcide CleverMark  Lukens M.D.   On: 08/23/2016 10:56   Dg Chest Portable 1 View  Result Date: 08/23/2016 CLINICAL DATA:  Abdominal pain. EXAM: PORTABLE CHEST 1 VIEW COMPARISON:  07/10/2015 . FINDINGS: Left IJ dual-lumen catheter noted with tip projected over right atrium. Cardiomegaly with bilateral pulmonary interstitial prominence consistent with congestive heart failure. No pleural effusion or pneumothorax . IMPRESSION: 1. Left IJ dual-lumen catheter with tip projected over the right atrium. 2. Congestive heart failure with mild pulmonary interstitial edema. Electronically Signed   By: Maisie Fushomas  Register   On: 08/23/2016 10:00    Medications:   . aspirin EC  81 mg Oral Daily  . cefTRIAXone (ROCEPHIN)  IV  2 g Intravenous Q24H  . Chlorhexidine Gluconate Cloth  6 each Topical Q0600  . heparin  4,500 Units Dialysis Once in dialysis  . insulin aspart  0-9 Units Subcutaneous TID WC  . insulin glargine  25 Units Subcutaneous BID  . levothyroxine  75 mcg Oral QAC breakfast  . mouth rinse  15 mL Mouth Rinse BID  . metoCLOPramide (REGLAN) injection  10 mg Intravenous Q12H  . midodrine  10 mg Oral Q T,Th,Sa-HD  . multivitamin  1 tablet Oral QHS  . mupirocin ointment  1 application Nasal BID  . piperacillin-tazobactam (ZOSYN)  IV  3.375 g Intravenous Q12H  . sodium chloride flush  3 mL Intravenous Q12H   Continuous Infusions:  Patient is medically complex and requires high complexity decision-making with review of multiple data points and coordination of care with a nephrologist. Level III visit.   LOS: 1 day   RAMA,CHRISTINA  [facility] Pager [phone]. If unable to reach me by pager, please call my cell phone at  [phone].  *Please refer to [email or web address], password [email or web address] to get updated schedule on who will round on this patient, as hospitalists switch teams weekly. If 7PM-7AM, please contact night-coverage at [email or web address], password [email or web address] for any overnight needs.  08/24/2016, 8:39 AM

## 2016-08-25 ENCOUNTER — Inpatient Hospital Stay (HOSPITAL_COMMUNITY): Payer: Medicare Other

## 2016-08-25 DIAGNOSIS — E662 Morbid (severe) obesity with alveolar hypoventilation: Secondary | ICD-10-CM

## 2016-08-25 LAB — BLOOD CULTURE ID PANEL (REFLEXED)

## 2016-08-25 LAB — RENAL FUNCTION PANEL
Albumin: 2.3 g/dL — ABNORMAL LOW (ref 3.5–5.0)
Anion gap: 11 (ref 5–15)
BUN: 24 mg/dL — AB (ref 6–20)
CHLORIDE: 95 mmol/L — AB (ref 101–111)
CO2: 26 mmol/L (ref 22–32)
CREATININE: 3.76 mg/dL — AB (ref 0.44–1.00)
Calcium: 8.7 mg/dL — ABNORMAL LOW (ref 8.9–10.3)
GFR calc Af Amer: 14 mL/min — ABNORMAL LOW (ref >60)
GFR calc non Af Amer: 12 mL/min — ABNORMAL LOW (ref >60)
Glucose, Bld: 272 mg/dL — ABNORMAL HIGH (ref 65–99)
Phosphorus: 5.9 mg/dL — ABNORMAL HIGH (ref 2.5–4.6)
Potassium: 4.3 mmol/L (ref 3.5–5.1)
Sodium: 132 mmol/L — ABNORMAL LOW (ref 135–145)

## 2016-08-25 LAB — CBC
HCT: 31.2 % — ABNORMAL LOW (ref 36.0–46.0)
Hemoglobin: 10 g/dL — ABNORMAL LOW (ref 12.0–15.0)
MCH: 33.7 pg (ref 26.0–34.0)
MCHC: 32.1 g/dL (ref 30.0–36.0)
MCV: 105.1 fL — AB (ref 78.0–100.0)
PLATELETS: 199 10*3/uL (ref 150–400)
RBC: 2.97 MIL/uL — ABNORMAL LOW (ref 3.87–5.11)
RDW: 17 % — AB (ref 11.5–15.5)
WBC: 16.8 10*3/uL — ABNORMAL HIGH (ref 4.0–10.5)

## 2016-08-25 LAB — GLUCOSE, CAPILLARY
GLUCOSE-CAPILLARY: 217 mg/dL — AB (ref 65–99)
GLUCOSE-CAPILLARY: 378 mg/dL — AB (ref 65–99)
GLUCOSE-CAPILLARY: 246 mg/dL — AB (ref 65–99)
Glucose-Capillary: 168 mg/dL — ABNORMAL HIGH (ref 65–99)
Glucose-Capillary: 328 mg/dL — ABNORMAL HIGH (ref 65–99)

## 2016-08-25 MED ORDER — VANCOMYCIN HCL IN DEXTROSE 1-5 GM/200ML-% IV SOLN: 1000.0000 mg | INTRAVENOUS | Status: DC | PRN

## 2016-08-25 MED ORDER — NEPRO/CARBSTEADY PO LIQD
237.0000 mL | Freq: Two times a day (BID) | ORAL | Status: DC
  Administered 2016-08-26 – 2016-08-30 (×5): 237 mL via ORAL

## 2016-08-25 MED ORDER — MIDODRINE HCL 5 MG PO TABS: 10.0000 mg | ORAL_TABLET | ORAL | Status: DC

## 2016-08-25 MED ORDER — VANCOMYCIN HCL 10 G IV SOLR
2000.0000 mg | Freq: Once | INTRAVENOUS | Status: AC
  Administered 2016-08-25: 2000 mg via INTRAVENOUS
  Filled 2016-08-25: qty 2000

## 2016-08-25 MED ORDER — DOXERCALCIFEROL 4 MCG/2ML IV SOLN
2.0000 ug | INTRAVENOUS | Status: DC
  Administered 2016-08-28: 2 ug via INTRAVENOUS
  Filled 2016-08-25 (×2): qty 2

## 2016-08-25 MED ORDER — MIDODRINE HCL 5 MG PO TABS
10.0000 mg | ORAL_TABLET | ORAL | Status: AC
  Administered 2016-08-26: 10 mg via ORAL
  Filled 2016-08-25: qty 2

## 2016-08-25 MED ORDER — SEVELAMER CARBONATE 2.4 G PO PACK
2.4000 g | PACK | Freq: Three times a day (TID) | ORAL | Status: DC
  Administered 2016-08-25 – 2016-08-28 (×7): 2.4 g via ORAL
  Filled 2016-08-25 (×11): qty 1

## 2016-08-25 NOTE — Progress Notes (Signed)
Pharmacy Antibiotic Note  Kimberly [name] is a 60 y.o. female admitted on 08/23/2016 with SOB, LLQ pain, and diarrhea. .  Pharmacy has been consulted for Zosyn dosing for a possible intra-abdominal infection.  Of note, she is a hemodialysis patient with MWF sessions (previously recorded incorrectly), who missed her a recent session prior to admission. Now, found to have 1/2 positive blood cultures for staph species, methicillin sensitive. She is afebrile with a wbc of 16.8 that is trending down.   Plan: Discontinue vancomycin  Zosyn 3.375 g IV every 12 hours Monitor clinical improvement  Follow-up length of therapy and blood cultures De-escalate antibiotic when clinically appropriate   Height: 5\' 4"  (162.6 cm) Weight: 273 lb 5.9 oz (124 kg) IBW/kg (Calculated) : 54.7  Temp (24hrs), Avg:98.4 F (36.9 C), Min:97.8 F (36.6 C), Max:99.1 F (37.3 C)   Recent Labs Lab 08/23/16 0940 08/23/16 1356 08/23/16 1732 08/24/16 0329 08/24/16 0908 08/25/16 0501  WBC 31.0*  --  20.7* 16.1* 18.3* 16.8*  CREATININE 8.82* 5.10* 4.43* 5.14* 5.31* 3.76*    Estimated Creatinine Clearance: 20.4 mL/min (by C-G formula based on SCr of 3.76 mg/dL (H)).    Allergies  Allergen Reactions  . Ciprofloxacin Itching  . Epinephrine     Increased heart rate    Antimicrobials this admission:  12/13 Ceftriaxone 2g x 1 dose (added to Zosyn) 12/15 Vancomycin 2g x 1 dose Zosyn 12/13 >>   Dose adjustments this admission:  N/a  Microbiology results:  12/12 BCx: 1/2 staph spp, methicillin sensitive 12/13 MRSA PCR: positive  Thank you for allowing pharmacy to be a part of this patient's care.  Allie BossierApryl Texie Tupou, PharmD PGY1 Pharmacy Resident [phone] (Pager) 08/25/2016 1:24 PM

## 2016-08-25 NOTE — Progress Notes (Signed)
[facility] Progress Note   Subjective:  Remains diarrhea-free. No CP. Chronic dyspnea on nasal oxygen. No new complaints today.  Objective Vitals:   08/24/16 1459 08/24/16 1704 08/24/16 2048 08/25/16 0556  BP: (!) 104/38 (!) 90/20 (!) 103/52 (!) 100/30  Pulse: 88 86 94 94  Resp: 17 16 18 20   Temp: 98.6 F (37 C) 99.1 F (37.3 C) 98 F (36.7 C) 98.4 F (36.9 C)  TempSrc: Oral Oral    SpO2: 98% 96% 100% 99%  Weight:      Height:       Physical Exam General: Pleasant, obese female, NAD. On nasal oxygen. Heart: RRR; no murmur Lungs: CTAB Abdomen: tense, non-tender Extremities: B LE with woody edema with chronic stasis dermatitis Dialysis Access: PC in L chest  Additional Objective Labs: Basic Metabolic Panel:  Recent Labs Lab 08/23/16 1732 08/24/16 0329 08/24/16 0908 08/25/16 0501  NA 132* 131* 132* 132*  K 4.4 5.1 5.3* 4.3  CL 97* 96* 96* 95*  CO2 19* 23 19* 26  GLUCOSE 148* 133* 116* 272*  BUN 34* 37* 40* 24*  CREATININE 4.43* 5.14* 5.31* 3.76*  CALCIUM 8.3* 8.5* 8.6* 8.7*  PHOS 5.0*  --  6.4* 5.9*   Liver Function Tests:  Recent Labs Lab 08/23/16 0940  08/24/16 0329 08/24/16 0908 08/25/16 0501  AST 15  --  13*  --   --   ALT 22  --  16  --   --   ALKPHOS 182*  --  156*  --   --   BILITOT 0.8  --  0.6  --   --   PROT 8.8*  --  7.4  --   --   ALBUMIN 3.3*  < > 2.5* 2.6* 2.3*  < > = values in this interval not displayed. CBC:  Recent Labs Lab 08/23/16 0940  08/23/16 1732 08/24/16 0329 08/24/16 0908 08/25/16 0501  WBC 31.0*  --  20.7* 16.1* 18.3* 16.8*  NEUTROABS 29.2*  --   --   --   --   --   HGB 10.9*  < > 9.9* 9.3* 10.0* 10.0*  HCT 32.9*  < > 30.0* 28.9* 30.3* 31.2*  MCV 102.2*  --  100.3* 101.8* 102.4* 105.1*  PLT 212  --  165 162 159 199  < > = values in this interval not displayed. Blood Culture    Component Value Date/Time   SDES BLOOD HEMODIALYSIS CATHETER 08/23/2016 1600   SPECREQUEST BOTTLES DRAWN AEROBIC AND  ANAEROBIC 10 ML 08/23/2016 1600   CULT GRAM POSITIVE COCCI 08/23/2016 1600   REPTSTATUS PENDING 08/23/2016 1600   CBG:  Recent Labs Lab 08/23/16 2136 08/24/16 0807 08/24/16 1458 08/24/16 1623 08/25/16 0806  GLUCAP 119* 83 98 96 217*   Studies/Results: X-ray Chest Pa And Lateral  Result Date: 08/24/2016 CLINICAL DATA:  Cough, shortness of Breath EXAM: CHEST  2 VIEW COMPARISON:  08/23/2016 FINDINGS: Cardiomegaly again noted. Limited study by patient's large body habitus. No gross infiltrate or pulmonary edema. Haziness in left midlung field most likely from overlying breast tissue. Stable double lumen left IJ catheter with tip in right atrium. IMPRESSION: Limited study by patient's large body habitus. No gross infiltrate or pulmonary edema. Haziness in left midlung field most likely from overlying breast tissue. Stable double lumen left IJ catheter with tip in right atrium. Electronically Signed   By: Natasha MeadLiviu  Pop M.D.   On: 08/24/2016 08:08   Ct Abdomen Pelvis W Contrast  Result Date:  08/23/2016 CLINICAL DATA:  The sided abdominal pain EXAM: CT ABDOMEN AND PELVIS WITH CONTRAST TECHNIQUE: Multidetector CT imaging of the abdomen and pelvis was performed using the standard protocol following bolus administration of intravenous contrast. CONTRAST:  100mL ISOVUE-300 IOPAMIDOL (ISOVUE-300) INJECTION 61% COMPARISON:  None. FINDINGS: Lower chest: No acute abnormality. Hepatobiliary: The liver is diffusely fatty infiltrated. The gallbladder is within normal limits. Pancreas: Unremarkable. No pancreatic ductal dilatation or surrounding inflammatory changes. Spleen: Normal in size without focal abnormality. Adrenals/Urinary Tract: Adrenal glands are unremarkable. Kidneys are normal, without renal calculi, focal lesion, or hydronephrosis. Bladder is unremarkable. Stomach/Bowel: Stomach is within normal limits. Appendix appears normal. No evidence of bowel wall thickening, distention, or inflammatory  changes. Vascular/Lymphatic: Aortic atherosclerosis. No enlarged abdominal or pelvic lymph nodes. Reproductive: IUD is noted in place. No other focal abnormality is noted. Other: Some laxity of the anterior abdominal wall is noted although no true hernia is seen. No abdominopelvic ascites. Musculoskeletal: No acute or significant osseous findings. IMPRESSION: Chronic changes without acute abnormality. Electronically Signed   By: Alcide CleverMark  Lukens M.D.   On: 08/23/2016 10:56   Dg Chest Portable 1 View  Result Date: 08/23/2016 CLINICAL DATA:  Abdominal pain. EXAM: PORTABLE CHEST 1 VIEW COMPARISON:  07/10/2015 . FINDINGS: Left IJ dual-lumen catheter noted with tip projected over right atrium. Cardiomegaly with bilateral pulmonary interstitial prominence consistent with congestive heart failure. No pleural effusion or pneumothorax . IMPRESSION: 1. Left IJ dual-lumen catheter with tip projected over the right atrium. 2. Congestive heart failure with mild pulmonary interstitial edema. Electronically Signed   By: Maisie Fushomas  Register   On: 08/23/2016 10:00   Medications:  . aspirin EC  81 mg Oral Daily  . Chlorhexidine Gluconate Cloth  6 each Topical Q0600  . heparin subcutaneous  5,000 Units Subcutaneous Q8H  . insulin aspart  0-9 Units Subcutaneous TID WC  . insulin glargine  25 Units Subcutaneous BID  . levothyroxine  75 mcg Oral QAC breakfast  . mouth rinse  15 mL Mouth Rinse BID  . metoCLOPramide (REGLAN) injection  10 mg Intravenous Q12H  . midodrine  10 mg Oral Q T,Th,Sa-HD  . multivitamin  1 tablet Oral QHS  . mupirocin ointment  1 application Nasal BID  . piperacillin-tazobactam (ZOSYN)  IV  3.375 g Intravenous Q12H  . sodium chloride flush  3 mL Intravenous Q12H  . vancomycin  2,000 mg Intravenous Once   Dialysis Orders: Previously recorded incorrectly, [facility]:30 [facility], BFR 400/DFR A1.5, EDW 126.5kg, PC, 2K/2.25Ca bath, UF profile 2, Linear Na - Heparin 6000  unit bolus, then 3000 unit mid-run bolus - Hectoral 2mcg TIW, last PTH 450 - Mircera 50mcg q 4 weeks, last given 11/15.  Assessment/Plan: 1. Diarrhea: Resolving. On IV Zosyn. 2. Staph Bacteremia: 1 of 2 BCx 12/13 turned positive for Staph, final cx pending. Vanc being added.  2. ESRD: MWF schedule as OP. Off schedule while here, last dialyzed 12/14. Plan for next HD 12/16, then switch back to MWF schedule for next week. 3. Chronic hypotension/volume: On midodrine. CHF on initial CXR. UF limited by hypotension at times, 3L off during last HD. Will repeat CXR, vol down, below dry wt now.  4. Anemia: Hgb 10, steady. No ESA for now. 5. Secondary hyperparathyroidism: Ca ok, Phos high. ?Not getting binders here. Pt takes Fosrenol pwdr at home, will start Renvela powder while here. 6. Nutrition: Alb 2.3, adding Nepro supplements. 7. DM: Per primary. 8. COPD: On  chronic oxygen. Per primary. 9. Atrial fibrillation: not on anticoagulation (asa only) d/t prior vaginal bleed. Per primary.   Ozzie Hoyle, PA-C 08/25/2016, 9:19 AM  [facility] Pager: [phone]  Pt seen, examined and agree w A/P as above.  [name] MD [employer] pager [phone]   08/25/2016, 3:56 PM

## 2016-08-25 NOTE — Progress Notes (Signed)
Notified Dr. Darnelle Catalanama regarding patient's BP 84/34, pt asymptomatic.  Will continue to monitor.

## 2016-08-25 NOTE — Progress Notes (Signed)
[facility] notified of patient bp 100/30 pt is asymptomatic. Ilean SkillVeronica Danessa Mensch LPN

## 2016-08-25 NOTE — Progress Notes (Signed)
Inpatient Diabetes Program Recommendations  AACE/ADA: New Consensus Statement on Inpatient Glycemic Control (2015)  Target Ranges:  Prepandial:   less than 140 mg/dL      Peak postprandial:   less than 180 mg/dL (1-2 hours)      Critically ill patients:  140 - 180 mg/dL  Results for Kimberly [name], [name] (MRN [id number]) as of 08/25/2016 13:14  Ref. Range 08/24/2016 08:07 08/24/2016 14:58 08/24/2016 16:23 08/25/2016 08:06 08/25/2016 11:58  Glucose-Capillary Latest Ref Range: 65 - 99 mg/dL 83 98 96 409217 (H) [id number] (H)    Review of Glycemic Control  Diabetes history: DM2 Outpatient Diabetes medications: Lantus 60 units BID, Novolog 6 units TID with meals Current orders for Inpatient glycemic control: Lantus 25 units BID, Novolog 0-9 units TID with meals  Inpatient Diabetes Program Recommendations: Correction (SSI): Please consider ordering Novolog 0-5 units QHS for bedtime correction. Insulin - Meal Coverage: Please consider ordering Novolog 4 units TID with meals for meal coverage if patient eats at least 50% of meals.  Thanks, Orlando PennerMarie Cordell Coke, RN, MSN, CDE Diabetes Coordinator Inpatient Diabetes Program [phone] (Team Pager from 8am to 5pm)

## 2016-08-25 NOTE — Progress Notes (Signed)
Progress Note    Kimberly [name]  ZOX:[id number] DOB: 1956-06-11  DOA: 08/23/2016 PCP: [name], MD    Brief Narrative:   Chief complaint: Follow-up weakness, shortness of breath  Kimberly [name] is an 60 y.o. female with a PMH of end-stage renal disease on HD, COPD, hypertension, CAD and type 2 diabetes who was admitted 08/23/16 with a chief complaint of shortness of breath, left lower quadrant pain and diarrhea. Of note, she missed her last HD treatment secondary to the diarrhea. Upon initial evaluation in the ED, chest x-ray showed mild CHF. CT of the abdomen and pelvis was negative for acute changes.  Assessment/Plan:   Principal problem:  Acute on chronic hypoxic respiratory failure, multifactorial Most  likely secondary to flluid overload after missing HD last Saturday (T/Th/S) . History of CHF and COPD on O2 at home  CXR showed cardiomegaly, CHF and mild pulmonary interstitial edema on admission but no frank pulmonary edema after dialysis. I don't see any evidence of pneumonia. 2D echo 06/2015 EF 55 to 60%, normal LV function, grade 2 diastolic dysfunction.    Leukocytosis WBC 31, likely related to underlying infection  including of GI source versus line infection. WBC trending down. Recent diarrhea with abdominal pain.  CXR not indicative of infiltrative process.  Remains afebrile. CT abdomen and pelvis negative for acute findings. Continue empiric Zosyn. Discontinue Rocephin as it is not adding any additional coverage. One blood culture is growing a staph species, likely a contaminant, but follow closely.  Active problems:  End-stage renal disease HD per nephrology.   Atrial Fibrillation  CHA2DS2-VASc score 6, not on anticoagulation due to prior history of vaginal bleed. Rate controlled. Continue aspirin.  Anemia of chronic disease  Hemoglobin on admission 10.9 which is consistent with her usual baseline.    Type II Diabetes, uncontrolled, with complications,  long-term insulin use Hemoglobin A1c 9.9% indicating suboptimal outpatient control. Currently being managed with 25 units of Lantus twice a day and insulin sensitive sliding scale 3 times a day. CBGs 83-137.  Hypothyroidism: Continue home Synthroid.  History of Orthostatic hypotension  Continue midodrine every other day.   Physical Deconditioning OT/PT evaluations requested.  MRSA carrier Contact precautions. Decontamination therapy ordered.   Family Communication/Anticipated D/C date and plan/Code Status   DVT prophylaxis: Heparin ordered. Code Status: Full Code.  Family Communication: No family at the bedside. Disposition Plan: Home in the next 24-48 hours.   Medical Consultants:    Nephrology   Procedures:    None  Anti-Infectives:    Zosyn 08/23/16--->  Rocephin 08/23/16---> 08/24/16  Subjective:   The patient reports that she has not had diarrhea or vomiting for 5 days. She reports a "rattle" in her chest and sore legs. Review of symptoms is positive for mild shortness of breath (but improved) and weakness of the lower legs.  Objective:    Vitals:   08/24/16 1704 08/24/16 2048 08/25/16 0556 08/25/16 1000  BP: (!) 90/20 (!) 103/52 (!) 100/30 (!) 84/34  Pulse: 86 94 94 97  Resp: 16 18 20 14   Temp: 99.1 F (37.3 C) 98 F (36.7 C) 98.4 F (36.9 C) 98.4 F (36.9 C)  TempSrc: Oral   Oral  SpO2: 96% 100% 99% 100%  Weight:      Height:        Intake/Output Summary (Last 24 hours) at 08/25/16 1438 Last data filed at 08/25/16 1204  Gross per 24 hour  Intake  720 ml  Output            -3000 ml  Net             3720 ml   Filed Weights   08/24/16 0358 08/24/16 1053 08/24/16 1437  Weight: 127 kg (279 lb 15.8 oz) 127 kg (279 lb 15.8 oz) 124 kg (273 lb 5.9 oz)    Exam: General exam: Appears calm and comfortable. Morbidly obese. Respiratory system: Clear to auscultation, diminished in the bases. Respiratory effort  normal. Cardiovascular system: S1 & S2 heard, RRR. No JVD,  rubs, gallops or clicks. No murmurs. Gastrointestinal system: Abdomen is nondistended, soft and nontender. No organomegaly or masses felt. Normal bowel sounds heard. Central nervous system: Alert and oriented. No focal neurological deficits. Extremities: As pictured below, Unchanged. Skin: Warm and dry with chronic venous stasis dermatitis. Psychiatry: Judgement and insight appear Mildly impaired. Mood & affect flat.      Data Reviewed:   I have personally reviewed following labs and imaging studies:  Labs: Basic Metabolic Panel:  Recent Labs Lab 08/23/16 0940 08/23/16 1356 08/23/16 1732 08/24/16 0329 08/24/16 0908 08/25/16 0501  NA 123* 132* 132* 131* 132* 132*  K >7.5* 4.3 4.4 5.1 5.3* 4.3  CL 95* 96* 97* 96* 96* 95*  CO2 10*  --  19* 23 19* 26  GLUCOSE 381* 214* 148* 133* 116* 272*  BUN 86* 44* 34* 37* 40* 24*  CREATININE 8.82* 5.10* 4.43* 5.14* 5.31* 3.76*  CALCIUM 9.1  --  8.3* 8.5* 8.6* 8.7*  PHOS  --   --  5.0*  --  6.4* 5.9*   GFR Estimated Creatinine Clearance: 20.4 mL/min (by C-G formula based on SCr of 3.76 mg/dL (H)). Liver Function Tests:  Recent Labs Lab 08/23/16 0940 08/23/16 1732 08/24/16 0329 08/24/16 0908 08/25/16 0501  AST 15  --  13*  --   --   ALT 22  --  16  --   --   ALKPHOS 182*  --  156*  --   --   BILITOT 0.8  --  0.6  --   --   PROT 8.8*  --  7.4  --   --   ALBUMIN 3.3* 2.7* 2.5* 2.6* 2.3*   CBC:  Recent Labs Lab 08/23/16 0940 08/23/16 1356 08/23/16 1732 08/24/16 0329 08/24/16 0908 08/25/16 0501  WBC 31.0*  --  20.7* 16.1* 18.3* 16.8*  NEUTROABS 29.2*  --   --   --   --   --   HGB 10.9* 10.5* 9.9* 9.3* 10.0* 10.0*  HCT 32.9* 31.0* 30.0* 28.9* 30.3* 31.2*  MCV 102.2*  --  100.3* 101.8* 102.4* 105.1*  PLT 212  --  165 162 159 199   CBG:  Recent Labs Lab 08/24/16 1458 08/24/16 1623 08/24/16 2126 08/25/16 0806 08/25/16 1158  GLUCAP 98 96 168* 217* 328*    Hgb A1c:  Recent Labs  08/23/16 1300  HGBA1C 9.9*   Microbiology Recent Results (from the past 240 hour(s))  Culture, blood (Routine X 2) w Reflex to ID Panel     Status: None (Preliminary result)   Collection Time: 08/23/16  3:45 PM  Result Value Ref Range Status   Specimen Description HEMODIALYSIS CATHETER  Final   Special Requests BOTTLES DRAWN AEROBIC AND ANAEROBIC UNK  Final   Culture NO GROWTH < 24 HOURS  Final   Report Status PENDING  Incomplete  Culture, blood (Routine X 2) w Reflex to ID Panel  Status: None (Preliminary result)   Collection Time: 08/23/16  4:00 PM  Result Value Ref Range Status   Specimen Description BLOOD HEMODIALYSIS CATHETER  Final   Special Requests BOTTLES DRAWN AEROBIC AND ANAEROBIC 10 ML  Final   Culture  Setup Time   Final    GRAM POSITIVE COCCI IN CLUSTERS AEROBIC BOTTLE ONLY CRITICAL RESULT CALLED TO, READ BACK BY AND VERIFIED WITHMerlene Morse: V BRYK PHARMD 0535 08/25/16 A BROWNING    Culture GRAM POSITIVE COCCI  Final   Report Status PENDING  Incomplete  Blood Culture ID Panel (Reflexed)     Status: Abnormal   Collection Time: 08/23/16  4:00 PM  Result Value Ref Range Status   Enterococcus species NOT DETECTED NOT DETECTED Final   Listeria monocytogenes NOT DETECTED NOT DETECTED Final   Staphylococcus species DETECTED (A) NOT DETECTED Final    Comment: CRITICAL RESULT CALLED TO, READ BACK BY AND VERIFIED WITHMerlene Morse: V BRYK PHARMD 0535 08/25/16 A BROWNING    Staphylococcus aureus NOT DETECTED NOT DETECTED Final   Methicillin resistance NOT DETECTED NOT DETECTED Final   Streptococcus species NOT DETECTED NOT DETECTED Final   Streptococcus agalactiae NOT DETECTED NOT DETECTED Final   Streptococcus pneumoniae NOT DETECTED NOT DETECTED Final   Streptococcus pyogenes NOT DETECTED NOT DETECTED Final   Acinetobacter baumannii NOT DETECTED NOT DETECTED Final   Enterobacteriaceae species NOT DETECTED NOT DETECTED Final   Enterobacter cloacae complex NOT  DETECTED NOT DETECTED Final   Escherichia coli NOT DETECTED NOT DETECTED Final   Klebsiella oxytoca NOT DETECTED NOT DETECTED Final   Klebsiella pneumoniae NOT DETECTED NOT DETECTED Final   Proteus species NOT DETECTED NOT DETECTED Final   Serratia marcescens NOT DETECTED NOT DETECTED Final   Haemophilus influenzae NOT DETECTED NOT DETECTED Final   Neisseria meningitidis NOT DETECTED NOT DETECTED Final   Pseudomonas aeruginosa NOT DETECTED NOT DETECTED Final   Candida albicans NOT DETECTED NOT DETECTED Final   Candida glabrata NOT DETECTED NOT DETECTED Final   Candida krusei NOT DETECTED NOT DETECTED Final   Candida parapsilosis NOT DETECTED NOT DETECTED Final   Candida tropicalis NOT DETECTED NOT DETECTED Final  MRSA PCR Screening     Status: Abnormal   Collection Time: 08/23/16  5:04 PM  Result Value Ref Range Status   MRSA by PCR POSITIVE (A) NEGATIVE Final    Comment:        The GeneXpert MRSA Assay (FDA approved for NASAL specimens only), is one component of a comprehensive MRSA colonization surveillance program. It is not intended to diagnose MRSA infection nor to guide or monitor treatment for MRSA infections. RESULT CALLED TO, READ BACK BY AND VERIFIED WITH: M.GABOR,RN AT 2347 BY L.PITT 08/23/16     Radiology: X-ray Chest Pa And Lateral  Result Date: 08/24/2016 CLINICAL DATA:  Cough, shortness of Breath EXAM: CHEST  2 VIEW COMPARISON:  08/23/2016 FINDINGS: Cardiomegaly again noted. Limited study by patient's large body habitus. No gross infiltrate or pulmonary edema. Haziness in left midlung field most likely from overlying breast tissue. Stable double lumen left IJ catheter with tip in right atrium. IMPRESSION: Limited study by patient's large body habitus. No gross infiltrate or pulmonary edema. Haziness in left midlung field most likely from overlying breast tissue. Stable double lumen left IJ catheter with tip in right atrium. Electronically Signed   By: Natasha MeadLiviu  Pop  M.D.   On: 08/24/2016 08:08    Medications:   . aspirin EC  81 mg Oral Daily  . Chlorhexidine  Gluconate Cloth  6 each Topical [id number]  . [START ON 08/28/2016] doxercalciferol  2 mcg Intravenous Q M,W,F-HD  . feeding supplement (NEPRO CARB STEADY)  237 mL Oral BID BM  . heparin subcutaneous  5,000 Units Subcutaneous Q8H  . insulin aspart  0-9 Units Subcutaneous TID WC  . insulin glargine  25 Units Subcutaneous BID  . levothyroxine  75 mcg Oral QAC breakfast  . mouth rinse  15 mL Mouth Rinse BID  . metoCLOPramide (REGLAN) injection  10 mg Intravenous Q12H  . [START ON 08/28/2016] midodrine  10 mg Oral Q M,W,F-HD  . [START ON 08/26/2016] midodrine  10 mg Oral Q Sat-HD  . multivitamin  1 tablet Oral QHS  . mupirocin ointment  1 application Nasal BID  . piperacillin-tazobactam (ZOSYN)  IV  3.375 g Intravenous Q12H  . sevelamer carbonate  2.4 g Oral TID WC  . sodium chloride flush  3 mL Intravenous Q12H   Continuous Infusions:  Patient is medically complex and requires high complexity decision-making with review of multiple data points and coordination of care with a nephrologist. Level III visit.   LOS: 2 days   RAMA,CHRISTINA  [facility] Pager [phone]. If unable to reach me by pager, please call my cell phone at [phone].  *Please refer to [email or web address], password [email or web address] to get updated schedule on who will round on this patient, as hospitalists switch teams weekly. If 7PM-7AM, please contact night-coverage at [email or web address], password [email or web address] for any overnight needs.  08/25/2016, 2:38 PM

## 2016-08-25 NOTE — Progress Notes (Signed)
Pharmacy Antibiotic Note  Kimberly [name] is a 60 y.o. female admitted on 08/23/2016 with bacteremia.  Pharmacy has been consulted for vancomycin dosing.  Plan: Vancomycin 2000mg  IV x1 then 1000mg  IV after each HD. Pre-HD vanc level goal 15-25. Consider narrowing to Ancef.  Height: 5\' 4"  (162.6 cm) Weight: 273 lb 5.9 oz (124 kg) IBW/kg (Calculated) : 54.7  Temp (24hrs), Avg:98.2 F (36.8 C), Min:97.6 F (36.4 C), Max:99.1 F (37.3 C)   Recent Labs Lab 08/23/16 0940 08/23/16 1356 08/23/16 1732 08/24/16 0329 08/24/16 0908 08/25/16 0501  WBC 31.0*  --  20.7* 16.1* 18.3* 16.8*  CREATININE 8.82* 5.10* 4.43* 5.14* 5.31*  --     Estimated Creatinine Clearance: 14.5 mL/min (by C-G formula based on SCr of 5.31 mg/dL (H)).    Allergies  Allergen Reactions  . Ciprofloxacin Itching  . Epinephrine     Increased heart rate    Antimicrobials this admission:  Ceftriaxone 2g x 1 dose 12/13 (added to Zosyn) Zosyn 12/13 >>  Vancomycin 12/15 >>    Microbiology results:  12/12 BCx: staph spp 12/13 MRSA PCR: positive  Thank you for allowing pharmacy to be a part of this patient's care.  Vernard GamblesVeronda Aaran Enberg, PharmD, BCPS  08/25/2016 6:13 AM   PHARMACY - PHYSICIAN COMMUNICATION CRITICAL VALUE ALERT - BLOOD CULTURE IDENTIFICATION (BCID)  Results for orders placed or performed during the hospital encounter of 08/23/16  Blood Culture ID Panel (Reflexed) (Collected: 08/23/2016  4:00 PM)  Result Value Ref Range   Enterococcus species NOT DETECTED NOT DETECTED   Listeria monocytogenes NOT DETECTED NOT DETECTED   Staphylococcus species DETECTED (A) NOT DETECTED   Staphylococcus aureus NOT DETECTED NOT DETECTED   Methicillin resistance NOT DETECTED NOT DETECTED   Streptococcus species NOT DETECTED NOT DETECTED   Streptococcus agalactiae NOT DETECTED NOT DETECTED   Streptococcus pneumoniae NOT DETECTED NOT DETECTED   Streptococcus pyogenes NOT DETECTED NOT DETECTED   Acinetobacter baumannii  NOT DETECTED NOT DETECTED   Enterobacteriaceae species NOT DETECTED NOT DETECTED   Enterobacter cloacae complex NOT DETECTED NOT DETECTED   Escherichia coli NOT DETECTED NOT DETECTED   Klebsiella oxytoca NOT DETECTED NOT DETECTED   Klebsiella pneumoniae NOT DETECTED NOT DETECTED   Proteus species NOT DETECTED NOT DETECTED   Serratia marcescens NOT DETECTED NOT DETECTED   Haemophilus influenzae NOT DETECTED NOT DETECTED   Neisseria meningitidis NOT DETECTED NOT DETECTED   Pseudomonas aeruginosa NOT DETECTED NOT DETECTED   Candida albicans NOT DETECTED NOT DETECTED   Candida glabrata NOT DETECTED NOT DETECTED   Candida krusei NOT DETECTED NOT DETECTED   Candida parapsilosis NOT DETECTED NOT DETECTED   Candida tropicalis NOT DETECTED NOT DETECTED

## 2016-08-25 NOTE — Progress Notes (Deleted)
Inpatient Diabetes Program Recommendations  AACE/ADA: New Consensus Statement on Inpatient Glycemic Control (2015)  Target Ranges:  Prepandial:   less than 140 mg/dL      Peak postprandial:   less than 180 mg/dL (1-2 hours)      Critically ill patients:  140 - 180 mg/dL   Lab Results  Component Value Date   GLUCAP 328 (H) 08/25/2016   HGBA1C 9.9 (H) 08/23/2016    Review of Glycemic Control  Results for Kimberly [name], Evlyn K (MRN [id number]) as of 08/25/2016 13:13  Ref. Range 07/20/2015 11:29 08/23/2016 16:48 08/23/2016 21:36 08/24/2016 08:07 08/24/2016 14:58 08/24/2016 16:23 08/25/2016 08:06 08/25/2016 11:58  Glucose-Capillary Latest Ref Range: 65 - 99 mg/dL 409259 (H) 811137 (H) 914119 (H) 83 98 96 217 (H) 328 (H)    Diabetes history: Type 2 Outpatient Diabetes medications: Lantus 25 units bid, Novolog 6 units with meals Current orders for Inpatient glycemic control: Lantus 25 units bid, Novolog 0-9 units tid  Inpatient Diabetes Program Recommendations: Agree with current medications for blood sugar management.    Susette RacerJulie Walfred Bettendorf, RN, BA, MHA, CDE Diabetes Coordinator Inpatient Diabetes Program  [phone] (Team Pager) [phone] [facility] Office) 08/25/2016 1:15 PM

## 2016-08-25 NOTE — Evaluation (Signed)
Physical Therapy Evaluation Patient Details Name: Kimberly ManilaSusan K [name] MRN: [id number] DOB: 01-27-1956 Today's Date: 08/25/2016   History of Present Illness  60 y.o.femaleadmittedon 12/13/2017with SOB, LLQ pain, and diarrhea. Principle problem indicated to be acute on chronic hypoxic respiratory failure.  Pt also reports falling at home on 08/19/16. PHM: stroke, DM, morbid obesity, chronic diastolic HF, COPD, hypertension, pulmonary hypertension, chronic edema/cellulitis BLE, CAD.   Clinical Impression  Pt presenting with decreased strength, endurance and overall functional mobility. PTA she states that she spent her days and nights in the recliner. Her husband works but comes home at lunch to check on her. [facility]. Bathing and dressing was performed by her husband. She was using a w/c to enter her home via ramp. Pt adamant that she will not consider going to any SNF for further rehabilitation. PT to continue to follow with focus on transfers and safety.     Follow Up Recommendations Home health PT;Supervision - Intermittent    Equipment Recommendations  None recommended by PT    Recommendations for Other Services       Precautions / Restrictions Precautions Precautions: Fall Precaution Comments: recent fall at home 08/19/16 Restrictions Weight Bearing Restrictions: No      Mobility  Bed Mobility Overal bed mobility: Needs Assistance Bed Mobility: Supine to Sit;Sit to Supine;Rolling Rolling: Max assist   Supine to sit: Min assist Sit to supine: Mod assist      Transfers Overall transfer level: Needs assistance Equipment used: Rolling walker (2 wheeled) Transfers: Sit to/from Stand Sit to Stand: Mod assist         General transfer comment: sit<>stand X2 from EOB. Pt unable to tolerate standing long enough to attempt pivot to chair.   Ambulation/Gait             General Gait Details: unable to  attempt  Stairs            Wheelchair Mobility    Modified Rankin (Stroke Patients Only)       Balance Overall balance assessment: Needs assistance Sitting-balance support: Bilateral upper extremity supported Sitting balance-Leahy Scale: Poor     Standing balance support: Bilateral upper extremity supported Standing balance-Leahy Scale: Poor Standing balance comment: pt continuing to hold bedrail once standing to reach for rw                             Pertinent Vitals/Pain Pain Assessment: 0-10 Pain Score: 6  Pain Location: Rt leg Pain Descriptors / Indicators: Aching (llike stretched ligaments) Pain Intervention(s): Limited activity within patient's tolerance;Monitored during session    Home Living Family/patient expects to be discharged to:: Private residence Living Arrangements: Spouse/significant other Available Help at Discharge: Family;Available PRN/intermittently Type of Home: House Home Access: Ramped entrance     Home Layout: One level Home Equipment: Walker - 2 wheels;Walker - 4 wheels;Bedside commode;Wheelchair - manual Additional Comments: Husband works and not available to assist. Does come home at lunch.     Prior Function Level of Independence: Needs assistance   Gait / Transfers Assistance Needed: would use rw to [facility]. Sitting in reclinor during the day.   ADL's / Homemaking Assistance Needed: husband asssits with bathing/dressing.         Hand Dominance        Extremity/Trunk Assessment   Upper Extremity Assessment Upper Extremity Assessment: Generalized weakness  Lower Extremity Assessment Lower Extremity Assessment: Generalized weakness       Communication   Communication: HOH  Cognition Arousal/Alertness: Awake/alert Behavior During Therapy: WFL for tasks assessed/performed Overall Cognitive Status: Within Functional Limits for tasks assessed                      General  Comments      Exercises     Assessment/Plan    PT Assessment Patient needs continued PT services  PT Problem List Decreased strength;Decreased range of motion;Decreased activity tolerance;Decreased balance;Decreased mobility;Obesity          PT Treatment Interventions DME instruction;Gait training;Functional mobility training;Therapeutic activities;Therapeutic exercise;Patient/family education    PT Goals (Current goals can be found in the Care Plan section)  Acute Rehab PT Goals Patient Stated Goal: go home PT Goal Formulation: With patient Time For Goal Achievement: 09/08/16 Potential to Achieve Goals: Good    Frequency Min 3X/week   Barriers to discharge        Co-evaluation               End of Session Equipment Utilized During Treatment: Gait belt;Oxygen Activity Tolerance: Patient limited by fatigue Patient left: in bed;with call bell/phone within reach Nurse Communication: Mobility status         Time: [id number] PT Time Calculation (min) (ACUTE ONLY): 54 min   Charges:   PT Evaluation $PT Eval Moderate Complexity: 1 Procedure PT Treatments $Therapeutic Activity: 23-37 mins   PT G Codes:        Christiane HaBenjamin J. Maranda Marte, PT, CSCS Pager [phone] Office [phone]  08/25/2016, 12:41 PM

## 2016-08-26 ENCOUNTER — Inpatient Hospital Stay (HOSPITAL_COMMUNITY): Payer: Medicare Other

## 2016-08-26 DIAGNOSIS — E871 Hypo-osmolality and hyponatremia: Secondary | ICD-10-CM

## 2016-08-26 DIAGNOSIS — Z9981 Dependence on supplemental oxygen: Secondary | ICD-10-CM

## 2016-08-26 DIAGNOSIS — I2721 Secondary pulmonary arterial hypertension: Secondary | ICD-10-CM

## 2016-08-26 LAB — RENAL FUNCTION PANEL
ANION GAP: 16 — AB (ref 5–15)
Albumin: 2.3 g/dL — ABNORMAL LOW (ref 3.5–5.0)
BUN: 39 mg/dL — ABNORMAL HIGH (ref 6–20)
CALCIUM: 8.9 mg/dL (ref 8.9–10.3)
CO2: 21 mmol/L — AB (ref 22–32)
Chloride: 94 mmol/L — ABNORMAL LOW (ref 101–111)
Creatinine, Ser: 5.08 mg/dL — ABNORMAL HIGH (ref 0.44–1.00)
GFR, EST AFRICAN AMERICAN: 10 mL/min — AB (ref >60)
GFR, EST NON AFRICAN AMERICAN: 8 mL/min — AB (ref >60)
Glucose, Bld: 166 mg/dL — ABNORMAL HIGH (ref 65–99)
PHOSPHORUS: 7.6 mg/dL — AB (ref 2.5–4.6)
Potassium: 4.7 mmol/L (ref 3.5–5.1)
SODIUM: 131 mmol/L — AB (ref 135–145)

## 2016-08-26 LAB — CBC
HCT: 31.5 % — ABNORMAL LOW (ref 36.0–46.0)
HEMOGLOBIN: 9.9 g/dL — AB (ref 12.0–15.0)
MCH: 33.2 pg (ref 26.0–34.0)
MCHC: 31.4 g/dL (ref 30.0–36.0)
MCV: 105.7 fL — ABNORMAL HIGH (ref 78.0–100.0)
PLATELETS: 195 10*3/uL (ref 150–400)
RBC: 2.98 MIL/uL — AB (ref 3.87–5.11)
RDW: 16.5 % — ABNORMAL HIGH (ref 11.5–15.5)
WBC: 15.4 10*3/uL — AB (ref 4.0–10.5)

## 2016-08-26 LAB — CULTURE, BLOOD (ROUTINE X 2)

## 2016-08-26 LAB — GLUCOSE, CAPILLARY
GLUCOSE-CAPILLARY: 84 mg/dL (ref 65–99)
GLUCOSE-CAPILLARY: 242 mg/dL — AB (ref 65–99)
GLUCOSE-CAPILLARY: 222 mg/dL — AB (ref 65–99)
Glucose-Capillary: 208 mg/dL — ABNORMAL HIGH (ref 65–99)

## 2016-08-26 MED ORDER — INSULIN ASPART 100 UNIT/ML ~~LOC~~ SOLN
0.0000 [IU] | Freq: Three times a day (TID) | SUBCUTANEOUS | Status: DC
  Administered 2016-08-26: 5 [IU] via SUBCUTANEOUS
  Administered 2016-08-27 (×2): 15 [IU] via SUBCUTANEOUS
  Administered 2016-08-27: 5 [IU] via SUBCUTANEOUS
  Administered 2016-08-28: 2 [IU] via SUBCUTANEOUS
  Administered 2016-08-28: 5 [IU] via SUBCUTANEOUS
  Administered 2016-08-29 (×3): 8 [IU] via SUBCUTANEOUS
  Administered 2016-08-30: 3 [IU] via SUBCUTANEOUS
  Administered 2016-08-30: 8 [IU] via SUBCUTANEOUS
  Administered 2016-08-31: 5 [IU] via SUBCUTANEOUS
  Administered 2016-08-31: 11 [IU] via SUBCUTANEOUS
  Administered 2016-08-31: 5 [IU] via SUBCUTANEOUS

## 2016-08-26 MED ORDER — INSULIN ASPART 100 UNIT/ML ~~LOC~~ SOLN
0.0000 [IU] | Freq: Every day | SUBCUTANEOUS | Status: DC
  Administered 2016-08-27 – 2016-08-28 (×2): 3 [IU] via SUBCUTANEOUS
  Administered 2016-08-30: 2 [IU] via SUBCUTANEOUS

## 2016-08-26 MED ORDER — MIDODRINE HCL 5 MG PO TABS
10.0000 mg | ORAL_TABLET | Freq: Every day | ORAL | Status: DC
  Administered 2016-08-27 – 2016-08-31 (×6): 10 mg via ORAL
  Filled 2016-08-26 (×7): qty 2

## 2016-08-26 MED ORDER — INSULIN ASPART 100 UNIT/ML ~~LOC~~ SOLN
4.0000 [IU] | Freq: Three times a day (TID) | SUBCUTANEOUS | Status: DC
  Administered 2016-08-26 – 2016-08-31 (×14): 4 [IU] via SUBCUTANEOUS

## 2016-08-26 NOTE — Progress Notes (Signed)
[facility] Progress Note   Subjective: "I'm having diarrhea, it's just running out of me." Denies SOB. Dr. Darnelle Catalanama to see pt-told Dr. Darnelle Catalanama that she had actually been constipated and was given prune juice. On HD, no issues.   Objective Vitals:   08/26/16 0830 08/26/16 0845 08/26/16 0915 08/26/16 0930  BP: (!) 94/43 124/75 (!) 102/25 (!) 91/36  Pulse: 87 75 83 81  Resp:      Temp:      TempSrc:      SpO2:      Weight:      Height:       Physical Exam General: Pleasant, obese female, NAD. On nasal oxygen. Heart: RRR; no murmur Lungs: CTAB Abdomen: tense, non-tender Extremities: B LE with woody edema with chronic stasis dermatitis Dialysis Access: PC in L chest connected to blood lines.    Additional Objective Labs: Basic Metabolic Panel:  Recent Labs Lab 08/24/16 0908 08/25/16 0501 08/26/16 0451  NA 132* 132* 131*  K 5.3* 4.3 4.7  CL 96* 95* 94*  CO2 19* 26 21*  GLUCOSE 116* 272* 166*  BUN 40* 24* 39*  CREATININE 5.31* 3.76* 5.08*  CALCIUM 8.6* 8.7* 8.9  PHOS 6.4* 5.9* 7.6*   Liver Function Tests:  Recent Labs Lab 08/23/16 0940  08/24/16 0329 08/24/16 0908 08/25/16 0501 08/26/16 0451  AST 15  --  13*  --   --   --   ALT 22  --  16  --   --   --   ALKPHOS 182*  --  156*  --   --   --   BILITOT 0.8  --  0.6  --   --   --   PROT 8.8*  --  7.4  --   --   --   ALBUMIN 3.3*  < > 2.5* 2.6* 2.3* 2.3*  < > = values in this interval not displayed. No results for input(s): LIPASE, AMYLASE in the last 168 hours. CBC:  Recent Labs Lab 08/23/16 0940  08/23/16 1732 08/24/16 0329 08/24/16 0908 08/25/16 0501 08/26/16 0451  WBC 31.0*  --  20.7* 16.1* 18.3* 16.8* 15.4*  NEUTROABS 29.2*  --   --   --   --   --   --   HGB 10.9*  < > 9.9* 9.3* 10.0* 10.0* 9.9*  HCT 32.9*  < > 30.0* 28.9* 30.3* 31.2* 31.5*  MCV 102.2*  --  100.3* 101.8* 102.4* 105.1* 105.7*  PLT 212  --  165 162 159 199 195  < > = values in this interval not displayed. Blood  Culture    Component Value Date/Time   SDES BLOOD HEMODIALYSIS CATHETER 08/23/2016 1600   SPECREQUEST BOTTLES DRAWN AEROBIC AND ANAEROBIC 10 ML 08/23/2016 1600   CULT GRAM POSITIVE COCCI 08/23/2016 1600   REPTSTATUS PENDING 08/23/2016 1600    CBG:  Recent Labs Lab 08/24/16 2126 08/25/16 0806 08/25/16 1158 08/25/16 1634 08/25/16 2126  GLUCAP 168* 217* 328* 378* 246*   Studies/Results: Dg Chest Port 1 View  Result Date: 08/25/2016 CLINICAL DATA:  Follow-up pulmonary edema EXAM: PORTABLE CHEST 1 VIEW COMPARISON:  08/24/2016 FINDINGS: Cardiac shadow is mildly prominent and stable. Left jugular dialysis catheter is again seen. Lungs are clear bilaterally. No vascular congestion or pulmonary edema is seen. No effusions are noted. IMPRESSION: No active disease. Electronically Signed   By: Alcide CleverMark  Lukens M.D.   On: 08/25/2016 17:13   Medications:  . aspirin EC  81 mg Oral  Daily  . Chlorhexidine Gluconate Cloth  6 each Topical Q0600  . [START ON 08/28/2016] doxercalciferol  2 mcg Intravenous Q M,W,F-HD  . feeding supplement (NEPRO CARB STEADY)  237 mL Oral BID BM  . heparin subcutaneous  5,000 Units Subcutaneous Q8H  . insulin aspart  0-15 Units Subcutaneous TID WC  . insulin aspart  0-5 Units Subcutaneous QHS  . insulin aspart  4 Units Subcutaneous TID WC  . insulin glargine  25 Units Subcutaneous BID  . levothyroxine  75 mcg Oral QAC breakfast  . mouth rinse  15 mL Mouth Rinse BID  . metoCLOPramide (REGLAN) injection  10 mg Intravenous Q12H  . midodrine  10 mg Oral Daily  . multivitamin  1 tablet Oral QHS  . mupirocin ointment  1 application Nasal BID  . piperacillin-tazobactam (ZOSYN)  IV  3.375 g Intravenous Q12H  . sevelamer carbonate  2.4 g Oral TID WC  . sodium chloride flush  3 mL Intravenous Q12H     Dialysis Orders: Previously recorded incorrectly, [facility] [place] 4:30 [facility], BFR 400/DFR A1.5, EDW 126.5kg, PC, 2K/2.25Ca bath, UF profile 2,  Linear Na - Heparin 6000 unit bolus, then 3000 unit mid-run bolus - Hectoral 2mcg TIW, last PTH 450 - Mircera 50mcg q 4 weeks, last given 11/15.  Assessment/Plan: 1. Diarrhea: Still having diarrhea/however she told attending she took prune juice for constipation. Stool for C- Diff not collected. Cont zosyn. WBC 15.4 down from 31.0 on adm. + MRSA by PCR. Contact precautions.  2. Staph Bacteremia: 1 of 2 BCx 12/13 turned positive for Staph. Thought to be contaminant. Rec'd 1 dose of vanc-vanc has been dc'd per pharmacy.  2. ESRD: MWF schedule as OP. HD off schedule today. Back on schedule Monday.  3. Chronic hypotension/volume: On midodrine. CHF on initial CXR. UF limited by hypotension at times, Last HD 08/24/16 Pre wt 127 Net UF 3000 Post wt 124 kg   UFG UFG 3000 today. Lower EDW on DC. Next HD 08/29/15.  4. Anemia: Hgb 9.9 steady. No ESA for now. 5. Secondary hyperparathyroidism: Ca ok, Phos high. ?Not getting binders here. Pt takes Fosrenol pwdr at home, will start Renvela powder while here. 6. Nutrition: Alb 2.3, adding Nepro supplements. 7. DM: Per primary. 8. COPD: On chronic oxygen. Per primary. 9. Atrial fibrillation: not on anticoagulation (asa only) d/t prior vaginal bleed. Per primary.   Rita H. Brown NP-C 08/26/2016, 10:45 AM  [facility] [phone]  Pt seen, examined and agree w A/P as above.  Vinson Moselleob Chelsee Hosie MD [facility] pager [phone]   08/26/2016, 3:18 PM

## 2016-08-26 NOTE — Progress Notes (Signed)
PT Cancellation Note  Patient Details Name: Kimberly [name] MRN: [id number] DOB: 05-01-1956   Cancelled Treatment:    Reason Eval/Treat Not Completed: Patient at procedure or test/unavailable;Fatigue/lethargy limiting ability to participate.  Attempted to see patient x2.  In am patient in HD.  In pm, patient declined due to fatigue.  Will return at later time.   Kimberly [name] 08/26/2016, 8:07 PM Durenda HurtSusan H. Renaldo Fiddleravis, PT, [facility] Pager [phone]

## 2016-08-26 NOTE — Progress Notes (Signed)
Progress Note    Kimberly [name]  ZOX:[id number] DOB: Jul 09, 1956  DOA: 08/23/2016 PCP: [name], MD    Brief Narrative:   Chief complaint: Follow-up weakness, shortness of breath  Kimberly [name] is an 60 y.o. female with a PMH of end-stage renal disease on HD, COPD, hypertension, CAD and type 2 diabetes who was admitted 08/23/16 with a chief complaint of shortness of breath, left lower quadrant pain and diarrhea. Of note, she missed her last HD treatment secondary to the diarrhea. Upon initial evaluation in the ED, chest x-ray showed mild CHF. CT of the abdomen and pelvis was negative for acute changes.  Assessment/Plan:   Principal problem:  Acute on chronic hypoxic respiratory failure, multifactorial Most  likely secondary to flluid overload after missing HD last Saturday (T/Th/S) . History of CHF and COPD on O2 at home  CXR showed cardiomegaly, CHF and mild pulmonary interstitial edema on admission but no frank pulmonary edema after dialysis. I don't see any evidence of pneumonia. 2D echo 06/2015 EF 55 to 60%, normal LV function, grade 2 diastolic dysfunction.    Leukocytosis WBC 31 on admission, likely related to underlying infection, GI source versus line infection. Recent diarrhea with abdominal pain.  CXR not indicative of infiltrative process. CT abdomen and pelvis negative for acute findings. One blood culture is growing a staph species, likely a contaminant, but follow closely. Afebrile with improved WBC. Continue Zosyn.  Active problems:  Diarrhea Patient had diarrhea prior to admission but none subsequently until today when she had several loose stools after being given prune juice. Doubt C. difficile, but given preadmission complaints, would go ahead and do a C. difficile evaluation.   End-stage renal disease HD per nephrology.   Atrial Fibrillation  CHA2DS2-VASc score 6, not on anticoagulation due to prior history of vaginal bleed. Rate controlled.  Continue aspirin.  Anemia of chronic disease  Hemoglobin on admission 10.9 which is consistent with her usual baseline.    Type II Diabetes, uncontrolled, with complications, long-term insulin use Hemoglobin A1c 9.9% indicating suboptimal outpatient control. Currently being managed with 25 units of Lantus twice a day and insulin sensitive sliding scale 3 times a day. CBGs [id number]. Change SSI to moderate scale with 4 units of meal coverage.  Hypothyroidism: Continue home Synthroid.  History of Orthostatic hypotension, persistent hypotension  Increase midodrine to daily dosing.   Physical Deconditioning OT/PT evaluations requested.  MRSA carrier Contact precautions. Decontamination therapy ordered.   Family Communication/Anticipated D/C date and plan/Code Status   DVT prophylaxis: Heparin ordered. Code Status: Full Code.  Family Communication: No family at the bedside. Disposition Plan: Home in the next 24-48 hours.   Medical Consultants:    Nephrology   Procedures:    None  Anti-Infectives:    Zosyn 08/23/16--->  Rocephin 08/23/16---> 08/24/16  Subjective:   The patient reports that she has had several loose stools after being given prune juice yesterday. Says she was able to stand with physical therapy but has pain in the right knee. Blood pressures remain intermittently low.  Objective:    Vitals:   08/26/16 0437 08/26/16 0709 08/26/16 0717 08/26/16 0725  BP: 130/63 (!) 114/35 (!) 85/30 (!) 102/45  Pulse: 76  81 87  Resp: 19 15 15    Temp: 98.2 F (36.8 C) 97 F (36.1 C)    TempSrc: Oral Oral    SpO2: 95% 99%    Weight:  127.6 kg (281 lb 4.9 oz)    Height:  Intake/Output Summary (Last 24 hours) at 08/26/16 0852 Last data filed at 08/26/16 0648  Gross per 24 hour  Intake              890 ml  Output                0 ml  Net              890 ml   Filed Weights   08/24/16 1437 08/26/16 0220 08/26/16 0709  Weight: 124 kg (273 lb 5.9  oz) 125.3 kg (276 lb 3.8 oz) 127.6 kg (281 lb 4.9 oz)    Exam: General exam: Appears calm and comfortable. Morbidly obese. Respiratory system: Clear to auscultation, diminished in the bases. Respiratory effort normal. Cardiovascular system: S1 & S2 heard, RRR. No JVD,  rubs, gallops or clicks. No murmurs. Gastrointestinal system: Abdomen is nondistended, soft and nontender. No organomegaly or masses felt. Normal bowel sounds heard. Central nervous system: Alert and oriented. No focal neurological deficits. Extremities: As pictured below, Unchanged. Skin: Warm and dry with chronic venous stasis dermatitis. Psychiatry: Judgement and insight appear Mildly impaired. Mood & affect flat.      Data Reviewed:   I have personally reviewed following labs and imaging studies:  Labs: Basic Metabolic Panel:  Recent Labs Lab 08/23/16 1732 08/24/16 0329 08/24/16 0908 08/25/16 0501 08/26/16 0451  NA 132* 131* 132* 132* 131*  K 4.4 5.1 5.3* 4.3 4.7  CL 97* 96* 96* 95* 94*  CO2 19* 23 19* 26 21*  GLUCOSE 148* 133* 116* 272* 166*  BUN 34* 37* 40* 24* 39*  CREATININE 4.43* 5.14* 5.31* 3.76* 5.08*  CALCIUM 8.3* 8.5* 8.6* 8.7* 8.9  PHOS 5.0*  --  6.4* 5.9* 7.6*   GFR Estimated Creatinine Clearance: 15.4 mL/min (by C-G formula based on SCr of 5.08 mg/dL (H)). Liver Function Tests:  Recent Labs Lab 08/23/16 0940 08/23/16 1732 08/24/16 0329 08/24/16 0908 08/25/16 0501 08/26/16 0451  AST 15  --  13*  --   --   --   ALT 22  --  16  --   --   --   ALKPHOS 182*  --  156*  --   --   --   BILITOT 0.8  --  0.6  --   --   --   PROT 8.8*  --  7.4  --   --   --   ALBUMIN 3.3* 2.7* 2.5* 2.6* 2.3* 2.3*   CBC:  Recent Labs Lab 08/23/16 0940  08/23/16 1732 08/24/16 0329 08/24/16 0908 08/25/16 0501 08/26/16 0451  WBC 31.0*  --  20.7* 16.1* 18.3* 16.8* 15.4*  NEUTROABS 29.2*  --   --   --   --   --   --   HGB 10.9*  < > 9.9* 9.3* 10.0* 10.0* 9.9*  HCT 32.9*  < > 30.0* 28.9* 30.3* 31.2*  31.5*  MCV 102.2*  --  100.3* 101.8* 102.4* 105.1* 105.7*  PLT 212  --  165 162 159 199 195  < > = values in this interval not displayed. CBG:  Recent Labs Lab 08/24/16 2126 08/25/16 0806 08/25/16 1158 08/25/16 1634 08/25/16 2126  GLUCAP 168* 217* 328* 378* 246*   Hgb A1c:  Recent Labs  08/23/16 1300  HGBA1C 9.9*   Microbiology Recent Results (from the past 240 hour(s))  Culture, blood (Routine X 2) w Reflex to ID Panel     Status: None (Preliminary result)   Collection Time: 08/23/16  3:45 PM  Result Value Ref Range Status   Specimen Description HEMODIALYSIS CATHETER  Final   Special Requests BOTTLES DRAWN AEROBIC AND ANAEROBIC UNK  Final   Culture NO GROWTH 2 DAYS  Final   Report Status PENDING  Incomplete  Culture, blood (Routine X 2) w Reflex to ID Panel     Status: None (Preliminary result)   Collection Time: 08/23/16  4:00 PM  Result Value Ref Range Status   Specimen Description BLOOD HEMODIALYSIS CATHETER  Final   Special Requests BOTTLES DRAWN AEROBIC AND ANAEROBIC 10 ML  Final   Culture  Setup Time   Final    GRAM POSITIVE COCCI IN CLUSTERS AEROBIC BOTTLE ONLY CRITICAL RESULT CALLED TO, READ BACK BY AND VERIFIED WITHMerlene Morse PHARMD 0535 08/25/16 A BROWNING    Culture GRAM POSITIVE COCCI  Final   Report Status PENDING  Incomplete  Blood Culture ID Panel (Reflexed)     Status: Abnormal   Collection Time: 08/23/16  4:00 PM  Result Value Ref Range Status   Enterococcus species NOT DETECTED NOT DETECTED Final   Listeria monocytogenes NOT DETECTED NOT DETECTED Final   Staphylococcus species DETECTED (A) NOT DETECTED Final    Comment: CRITICAL RESULT CALLED TO, READ BACK BY AND VERIFIED WITHMerlene Morse PHARMD 0535 08/25/16 A BROWNING    Staphylococcus aureus NOT DETECTED NOT DETECTED Final   Methicillin resistance NOT DETECTED NOT DETECTED Final   Streptococcus species NOT DETECTED NOT DETECTED Final   Streptococcus agalactiae NOT DETECTED NOT DETECTED Final    Streptococcus pneumoniae NOT DETECTED NOT DETECTED Final   Streptococcus pyogenes NOT DETECTED NOT DETECTED Final   Acinetobacter baumannii NOT DETECTED NOT DETECTED Final   Enterobacteriaceae species NOT DETECTED NOT DETECTED Final   Enterobacter cloacae complex NOT DETECTED NOT DETECTED Final   Escherichia coli NOT DETECTED NOT DETECTED Final   Klebsiella oxytoca NOT DETECTED NOT DETECTED Final   Klebsiella pneumoniae NOT DETECTED NOT DETECTED Final   Proteus species NOT DETECTED NOT DETECTED Final   Serratia marcescens NOT DETECTED NOT DETECTED Final   Haemophilus influenzae NOT DETECTED NOT DETECTED Final   Neisseria meningitidis NOT DETECTED NOT DETECTED Final   Pseudomonas aeruginosa NOT DETECTED NOT DETECTED Final   Candida albicans NOT DETECTED NOT DETECTED Final   Candida glabrata NOT DETECTED NOT DETECTED Final   Candida krusei NOT DETECTED NOT DETECTED Final   Candida parapsilosis NOT DETECTED NOT DETECTED Final   Candida tropicalis NOT DETECTED NOT DETECTED Final  MRSA PCR Screening     Status: Abnormal   Collection Time: 08/23/16  5:04 PM  Result Value Ref Range Status   MRSA by PCR POSITIVE (A) NEGATIVE Final    Comment:        The GeneXpert MRSA Assay (FDA approved for NASAL specimens only), is one component of a comprehensive MRSA colonization surveillance program. It is not intended to diagnose MRSA infection nor to guide or monitor treatment for MRSA infections. RESULT CALLED TO, READ BACK BY AND VERIFIED WITH: M.GABOR,RN AT 2347 BY L.PITT 08/23/16     Radiology: Dg Chest Port 1 View  Result Date: 08/25/2016 CLINICAL DATA:  Follow-up pulmonary edema EXAM: PORTABLE CHEST 1 VIEW COMPARISON:  08/24/2016 FINDINGS: Cardiac shadow is mildly prominent and stable. Left jugular dialysis catheter is again seen. Lungs are clear bilaterally. No vascular congestion or pulmonary edema is seen. No effusions are noted. IMPRESSION: No active disease. Electronically Signed    By: Alcide Clever M.D.   On: 08/25/2016  17:13    Medications:   . aspirin EC  81 mg Oral Daily  . Chlorhexidine Gluconate Cloth  6 each Topical Q0600  . [START ON 08/28/2016] doxercalciferol  2 mcg Intravenous Q M,W,F-HD  . feeding supplement (NEPRO CARB STEADY)  237 mL Oral BID BM  . heparin subcutaneous  5,000 Units Subcutaneous Q8H  . insulin aspart  0-9 Units Subcutaneous TID WC  . insulin glargine  25 Units Subcutaneous BID  . levothyroxine  75 mcg Oral QAC breakfast  . mouth rinse  15 mL Mouth Rinse BID  . metoCLOPramide (REGLAN) injection  10 mg Intravenous Q12H  . [START ON 08/28/2016] midodrine  10 mg Oral Q M,W,F-HD  . multivitamin  1 tablet Oral QHS  . mupirocin ointment  1 application Nasal BID  . piperacillin-tazobactam (ZOSYN)  IV  3.375 g Intravenous Q12H  . sevelamer carbonate  2.4 g Oral TID WC  . sodium chloride flush  3 mL Intravenous Q12H   Continuous Infusions:  Patient is medically complex and requires high complexity decision-making with review of multiple data points and coordination of care with a nephrologist. Level III visit.   LOS: 3 days   Jase Reep  [facility] Pager [phone]. If unable to reach me by pager, please call my cell phone at [phone].  *Please refer to [email or web address], password [email or web address] to get updated schedule on who will round on this patient, as hospitalists switch teams weekly. If 7PM-7AM, please contact night-coverage at [email or web address], password [email or web address] for any overnight needs.  08/26/2016, 8:52 AM

## 2016-08-27 LAB — GASTROINTESTINAL PANEL BY PCR, STOOL (REPLACES STOOL CULTURE)
ADENOVIRUS F40/41: NOT DETECTED
Astrovirus: NOT DETECTED
CAMPYLOBACTER SPECIES: NOT DETECTED
CRYPTOSPORIDIUM: NOT DETECTED
CYCLOSPORA CAYETANENSIS: NOT DETECTED
ENTEROPATHOGENIC E COLI (EPEC): NOT DETECTED
ENTEROTOXIGENIC E COLI (ETEC): NOT DETECTED
Entamoeba histolytica: NOT DETECTED
Enteroaggregative E coli (EAEC): NOT DETECTED
Giardia lamblia: NOT DETECTED
Norovirus GI/GII: NOT DETECTED
PLESIMONAS SHIGELLOIDES: NOT DETECTED
ROTAVIRUS A: NOT DETECTED
SAPOVIRUS (I, II, IV, AND V): NOT DETECTED
SHIGA LIKE TOXIN PRODUCING E COLI (STEC): NOT DETECTED
SHIGELLA/ENTEROINVASIVE E COLI (EIEC): NOT DETECTED
Salmonella species: NOT DETECTED
VIBRIO SPECIES: NOT DETECTED
Vibrio cholerae: NOT DETECTED
Yersinia enterocolitica: NOT DETECTED

## 2016-08-27 LAB — BLOOD GAS, ARTERIAL
ACID-BASE DEFICIT: 3 mmol/L — AB (ref 0.0–2.0)
Bicarbonate: 22.3 mmol/L (ref 20.0–28.0)
DRAWN BY: 41308
O2 Content: 2 L/min
O2 Saturation: 96 %
Patient temperature: 98.6
pCO2 arterial: 45.8 mmHg (ref 32.0–48.0)
pH, Arterial: 7.309 — ABNORMAL LOW (ref 7.350–7.450)
pO2, Arterial: 94 mmHg (ref 83.0–108.0)

## 2016-08-27 LAB — CBC
HCT: 30 % — ABNORMAL LOW (ref 36.0–46.0)
Hemoglobin: 9.3 g/dL — ABNORMAL LOW (ref 12.0–15.0)
MCH: 32.6 pg (ref 26.0–34.0)
MCHC: 31 g/dL (ref 30.0–36.0)
MCV: 105.3 fL — ABNORMAL HIGH (ref 78.0–100.0)
PLATELETS: 185 10*3/uL (ref 150–400)
RBC: 2.85 MIL/uL — AB (ref 3.87–5.11)
RDW: 16.2 % — AB (ref 11.5–15.5)
WBC: 13.6 10*3/uL — ABNORMAL HIGH (ref 4.0–10.5)

## 2016-08-27 LAB — RENAL FUNCTION PANEL
ALBUMIN: 2.1 g/dL — AB (ref 3.5–5.0)
Anion gap: 14 (ref 5–15)
BUN: 17 mg/dL (ref 6–20)
CO2: 22 mmol/L (ref 22–32)
CREATININE: 3.29 mg/dL — AB (ref 0.44–1.00)
Calcium: 8.6 mg/dL — ABNORMAL LOW (ref 8.9–10.3)
Chloride: 93 mmol/L — ABNORMAL LOW (ref 101–111)
GFR, EST AFRICAN AMERICAN: 16 mL/min — AB (ref >60)
GFR, EST NON AFRICAN AMERICAN: 14 mL/min — AB (ref >60)
Glucose, Bld: 227 mg/dL — ABNORMAL HIGH (ref 65–99)
PHOSPHORUS: 5.5 mg/dL — AB (ref 2.5–4.6)
POTASSIUM: 4.1 mmol/L (ref 3.5–5.1)
Sodium: 129 mmol/L — ABNORMAL LOW (ref 135–145)

## 2016-08-27 LAB — GLUCOSE, CAPILLARY
GLUCOSE-CAPILLARY: 194 mg/dL — AB (ref 65–99)
GLUCOSE-CAPILLARY: 225 mg/dL — AB (ref 65–99)
GLUCOSE-CAPILLARY: 250 mg/dL — AB (ref 65–99)
GLUCOSE-CAPILLARY: 251 mg/dL — AB (ref 65–99)
Glucose-Capillary: 210 mg/dL — ABNORMAL HIGH (ref 65–99)
Glucose-Capillary: 378 mg/dL — ABNORMAL HIGH (ref 65–99)
Glucose-Capillary: 382 mg/dL — ABNORMAL HIGH (ref 65–99)

## 2016-08-27 MED ORDER — NYSTATIN 100000 UNIT/GM EX POWD
Freq: Three times a day (TID) | CUTANEOUS | Status: DC
  Administered 2016-08-27 – 2016-08-31 (×14): via TOPICAL
  Filled 2016-08-27: qty 15

## 2016-08-27 MED ORDER — VANCOMYCIN HCL IN DEXTROSE 1-5 GM/200ML-% IV SOLN
1000.0000 mg | Freq: Once | INTRAVENOUS | Status: AC
  Administered 2016-08-27: 1000 mg via INTRAVENOUS
  Filled 2016-08-27: qty 200

## 2016-08-27 NOTE — Plan of Care (Signed)
Problem: Nutrition: Goal: Adequate nutrition will be maintained Outcome: Progressing Per MD order, patient was started on Nepro carb steady between meals.

## 2016-08-27 NOTE — Progress Notes (Signed)
Progress Note    Kimberly [name]  JXB:[id number] DOB: 18-Jun-1956  DOA: 08/23/2016 PCP: Ginette OttoSTONEKING,[name] THOMAS, MD    Brief Narrative:   Chief complaint: Follow-up weakness, shortness of breath  Kimberly [name] is an 60 y.o. female with a PMH of end-stage renal disease on HD, COPD, hypertension, CAD and type 2 diabetes who was admitted 08/23/16 with a chief complaint of shortness of breath, left lower quadrant pain and diarrhea. Of note, she missed her last HD treatment secondary to the diarrhea. Upon initial evaluation in the ED, chest x-ray showed mild CHF. CT of the abdomen and pelvis was negative for acute changes.  Assessment/Plan:   Principal problem:  Acute on chronic hypoxic respiratory failure, multifactorial Most  likely secondary to flluid overload after missing HD last Saturday (T/Th/S) . History of CHF and COPD on O2 at home  CXR showed cardiomegaly, CHF and mild pulmonary interstitial edema on admission but no frank pulmonary edema after dialysis. I don't see any evidence of pneumonia. 2D echo 06/2015 EF 55 to 60%, normal LV function, grade 2 diastolic dysfunction.    Leukocytosis WBC 31 on admission, likely related to underlying infection, GI source versus line infection. Recent diarrhea with abdominal pain.  CXR not indicative of infiltrative process. CT abdomen and pelvis negative for acute findings. One blood culture is growing a staph species, likely a contaminant, but follow closely. Afebrile with improved WBC. Continue Zosyn.  Active problems:  Diarrhea Patient had diarrhea prior to admission but none subsequently until today when she had several loose stools after being given prune juice. Doubt C. difficile, but given preadmission complaints, would go ahead and do a GI pathogen evaluation.   End-stage renal disease HD per nephrology.   Atrial Fibrillation  CHA2DS2-VASc score 6, not on anticoagulation due to prior history of vaginal bleed. Rate controlled. Continue  aspirin.  Anemia of chronic disease  Hemoglobin on admission 10.9 which is consistent with her usual baseline.    Type II Diabetes, uncontrolled, with complications, long-term insulin use Hemoglobin A1c 9.9% indicating suboptimal outpatient control. Currently being managed with 25 units of Lantus twice a day and insulin sensitive sliding scale 3 times a day. CBGs [id number]. Change SSI to moderate scale with 4 units of meal coverage.  Hypothyroidism: Continue home Synthroid.  History of Orthostatic hypotension, persistent hypotension  Increase midodrine to daily dosing.   Physical Deconditioning OT/PT evaluations requested.  MRSA carrier Contact precautions. Decontamination therapy ordered.  Right knee pain s/p fall Images reviewed. No evidence of fracture/dislocation.   Family Communication/Anticipated D/C date and plan/Code Status   DVT prophylaxis: Heparin ordered. Code Status: Full Code.  Family Communication: No family at the bedside. Disposition Plan: Home in the next 24-48 hours.   Medical Consultants:    Nephrology   Procedures:    None  Anti-Infectives:    Zosyn 08/23/16--->  Rocephin 08/23/16---> 08/24/16  Subjective:   The patient reports that she continues to have some loose stools. Says she was able to stand with physical therapy but has pain in the right knee. Blood pressures remain intermittently low.  Objective:    Vitals:   08/26/16 1716 08/26/16 2137 08/26/16 2343 08/27/16 0419  BP: 91/70 107/66  (!) 86/32  Pulse: 91 81  81  Resp: 19 17  17   Temp: 98.7 F (37.1 C) 98.9 F (37.2 C)  98.7 F (37.1 C)  TempSrc: Oral Oral  Oral  SpO2: 100% 95% (!) 0% 96%  Weight:  Height:        Intake/Output Summary (Last 24 hours) at 08/27/16 0848 Last data filed at 08/27/16 0606  Gross per 24 hour  Intake              937 ml  Output             2800 ml  Net            -1863 ml   Filed Weights   08/24/16 1437 08/26/16 0220 08/26/16  0709  Weight: 124 kg (273 lb 5.9 oz) 125.3 kg (276 lb 3.8 oz) 127.6 kg (281 lb 4.9 oz)    Exam: General exam: Appears calm and comfortable. Morbidly obese. Respiratory system: Clear to auscultation, diminished in the bases. Respiratory effort normal. Cardiovascular system: S1 & S2 heard, RRR. No JVD,  rubs, gallops or clicks. No murmurs. Gastrointestinal system: Abdomen is nondistended, soft and nontender. No organomegaly or masses felt. Normal bowel sounds heard. Central nervous system: Alert and oriented. No focal neurological deficits. Extremities: As pictured below, Unchanged. Skin: Warm and dry with chronic venous stasis dermatitis. Psychiatry: Judgement and insight appear Mildly impaired. Mood & affect flat.      Data Reviewed:   I have personally reviewed following labs and imaging studies:  Labs: Basic Metabolic Panel:  Recent Labs Lab 08/23/16 1732 08/24/16 0329 08/24/16 0908 08/25/16 0501 08/26/16 0451 08/27/16 0205  NA 132* 131* 132* 132* 131* 129*  K 4.4 5.1 5.3* 4.3 4.7 4.1  CL 97* 96* 96* 95* 94* 93*  CO2 19* 23 19* 26 21* 22  GLUCOSE 148* 133* 116* 272* 166* 227*  BUN 34* 37* 40* 24* 39* 17  CREATININE 4.43* 5.14* 5.31* 3.76* 5.08* 3.29*  CALCIUM 8.3* 8.5* 8.6* 8.7* 8.9 8.6*  PHOS 5.0*  --  6.4* 5.9* 7.6* 5.5*   GFR Estimated Creatinine Clearance: 23.8 mL/min (by C-G formula based on SCr of 3.29 mg/dL (H)). Liver Function Tests:  Recent Labs Lab 08/23/16 0940  08/24/16 0329 08/24/16 0908 08/25/16 0501 08/26/16 0451 08/27/16 0205  AST 15  --  13*  --   --   --   --   ALT 22  --  16  --   --   --   --   ALKPHOS 182*  --  156*  --   --   --   --   BILITOT 0.8  --  0.6  --   --   --   --   PROT 8.8*  --  7.4  --   --   --   --   ALBUMIN 3.3*  < > 2.5* 2.6* 2.3* 2.3* 2.1*  < > = values in this interval not displayed. CBC:  Recent Labs Lab 08/23/16 0940  08/24/16 0329 08/24/16 0908 08/25/16 0501 08/26/16 0451 08/27/16 0205  WBC 31.0*  <  > 16.1* 18.3* 16.8* 15.4* 13.6*  NEUTROABS 29.2*  --   --   --   --   --   --   HGB 10.9*  < > 9.3* 10.0* 10.0* 9.9* 9.3*  HCT 32.9*  < > 28.9* 30.3* 31.2* 31.5* 30.0*  MCV 102.2*  < > 101.8* 102.4* 105.1* 105.7* 105.3*  PLT 212  < > 162 159 199 195 185  < > = values in this interval not displayed. CBG:  Recent Labs Lab 08/26/16 1635 08/26/16 2335 08/27/16 0047 08/27/16 0121 08/27/16 0411  GLUCAP 222* 208* 210* 194* 225*   Hgb A1c: No results  for input(s): HGBA1C in the last 72 hours. Microbiology Recent Results (from the past 240 hour(s))  Culture, blood (Routine X 2) w Reflex to ID Panel     Status: None (Preliminary result)   Collection Time: 08/23/16  3:45 PM  Result Value Ref Range Status   Specimen Description HEMODIALYSIS CATHETER  Final   Special Requests BOTTLES DRAWN AEROBIC AND ANAEROBIC UNK  Final   Culture NO GROWTH 3 DAYS  Final   Report Status PENDING  Incomplete  Culture, blood (Routine X 2) w Reflex to ID Panel     Status: Abnormal   Collection Time: 08/23/16  4:00 PM  Result Value Ref Range Status   Specimen Description BLOOD HEMODIALYSIS CATHETER  Final   Special Requests BOTTLES DRAWN AEROBIC AND ANAEROBIC 10 ML  Final   Culture  Setup Time   Final    GRAM POSITIVE COCCI IN CLUSTERS AEROBIC BOTTLE ONLY CRITICAL RESULT CALLED TO, READ BACK BY AND VERIFIED WITHMerlene Morse PHARMD 0535 08/25/16 A BROWNING    Culture (A)  Final    STAPHYLOCOCCUS SPECIES (COAGULASE NEGATIVE) THE SIGNIFICANCE OF ISOLATING THIS ORGANISM FROM A SINGLE SET OF BLOOD CULTURES WHEN MULTIPLE SETS ARE DRAWN IS UNCERTAIN. PLEASE NOTIFY THE MICROBIOLOGY DEPARTMENT WITHIN ONE WEEK IF SPECIATION AND SENSITIVITIES ARE REQUIRED.    Report Status 08/26/2016 FINAL  Final  Blood Culture ID Panel (Reflexed)     Status: Abnormal   Collection Time: 08/23/16  4:00 PM  Result Value Ref Range Status   Enterococcus species NOT DETECTED NOT DETECTED Final   Listeria monocytogenes NOT DETECTED NOT  DETECTED Final   Staphylococcus species DETECTED (A) NOT DETECTED Final    Comment: CRITICAL RESULT CALLED TO, READ BACK BY AND VERIFIED WITHMerlene Morse PHARMD 0535 08/25/16 A BROWNING    Staphylococcus aureus NOT DETECTED NOT DETECTED Final   Methicillin resistance NOT DETECTED NOT DETECTED Final   Streptococcus species NOT DETECTED NOT DETECTED Final   Streptococcus agalactiae NOT DETECTED NOT DETECTED Final   Streptococcus pneumoniae NOT DETECTED NOT DETECTED Final   Streptococcus pyogenes NOT DETECTED NOT DETECTED Final   Acinetobacter baumannii NOT DETECTED NOT DETECTED Final   Enterobacteriaceae species NOT DETECTED NOT DETECTED Final   Enterobacter cloacae complex NOT DETECTED NOT DETECTED Final   Escherichia coli NOT DETECTED NOT DETECTED Final   Klebsiella oxytoca NOT DETECTED NOT DETECTED Final   Klebsiella pneumoniae NOT DETECTED NOT DETECTED Final   Proteus species NOT DETECTED NOT DETECTED Final   Serratia marcescens NOT DETECTED NOT DETECTED Final   Haemophilus influenzae NOT DETECTED NOT DETECTED Final   Neisseria meningitidis NOT DETECTED NOT DETECTED Final   Pseudomonas aeruginosa NOT DETECTED NOT DETECTED Final   Candida albicans NOT DETECTED NOT DETECTED Final   Candida glabrata NOT DETECTED NOT DETECTED Final   Candida krusei NOT DETECTED NOT DETECTED Final   Candida parapsilosis NOT DETECTED NOT DETECTED Final   Candida tropicalis NOT DETECTED NOT DETECTED Final  MRSA PCR Screening     Status: Abnormal   Collection Time: 08/23/16  5:04 PM  Result Value Ref Range Status   MRSA by PCR POSITIVE (A) NEGATIVE Final    Comment:        The GeneXpert MRSA Assay (FDA approved for NASAL specimens only), is one component of a comprehensive MRSA colonization surveillance program. It is not intended to diagnose MRSA infection nor to guide or monitor treatment for MRSA infections. RESULT CALLED TO, READ BACK BY AND VERIFIED WITH: M.GABOR,RN AT 2347  BY L.PITT 08/23/16       Radiology: Dg Chest Port 1 View  Result Date: 08/25/2016 CLINICAL DATA:  Follow-up pulmonary edema EXAM: PORTABLE CHEST 1 VIEW COMPARISON:  08/24/2016 FINDINGS: Cardiac shadow is mildly prominent and stable. Left jugular dialysis catheter is again seen. Lungs are clear bilaterally. No vascular congestion or pulmonary edema is seen. No effusions are noted. IMPRESSION: No active disease. Electronically Signed   By: Alcide Clever M.D.   On: 08/25/2016 17:13   Dg Knee Right Port  Result Date: 08/26/2016 CLINICAL DATA:  Knee pain without trauma EXAM: PORTABLE RIGHT KNEE - 1-2 VIEW COMPARISON:  None. FINDINGS: Vascular calcifications. Degenerative changes in the patellofemoral compartment. No fracture, dislocation, or joint effusion. IMPRESSION: No acute abnormalities. Electronically Signed   By: Gerome Sam III M.D   On: 08/26/2016 18:03    Medications:   . aspirin EC  81 mg Oral Daily  . Chlorhexidine Gluconate Cloth  6 each Topical Q0600  . [START ON 08/28/2016] doxercalciferol  2 mcg Intravenous Q M,W,F-HD  . feeding supplement (NEPRO CARB STEADY)  237 mL Oral BID BM  . heparin subcutaneous  5,000 Units Subcutaneous Q8H  . insulin aspart  0-15 Units Subcutaneous TID WC  . insulin aspart  0-5 Units Subcutaneous QHS  . insulin aspart  4 Units Subcutaneous TID WC  . insulin glargine  25 Units Subcutaneous BID  . levothyroxine  75 mcg Oral QAC breakfast  . mouth rinse  15 mL Mouth Rinse BID  . metoCLOPramide (REGLAN) injection  10 mg Intravenous Q12H  . midodrine  10 mg Oral Daily  . multivitamin  1 tablet Oral QHS  . mupirocin ointment  1 application Nasal BID  . piperacillin-tazobactam (ZOSYN)  IV  3.375 g Intravenous Q12H  . sevelamer carbonate  2.4 g Oral TID WC  . sodium chloride flush  3 mL Intravenous Q12H   Continuous Infusions: > 4 prob point/1 data point/mod risk = level 2 visit Problems/DDx Points   Self limited or minor (max 2)       1       Established problem,  stable       1   5  Established problem, worsening       2   2  New problem, no additional W/U planned (max 1)       3   New problem, additional W/U planned        4   4   Data Reviewed Points   Review/order clinical lab tests       1   1  Review/order x-rays       1   Review/order tests (Echo, EKG, PFTs, etc)       1   Discussion of test results w/ performing MD       1   Independent review of image, tracing or specimen       2   Decision to obtain old records       1   Review and summation of old records       2    Level of risk Presenting prob Diagnostics Management   Minimal 1 self limited/minor Labs CXR EKG/EEG U/A U/S Rest Gargles Bandages Dressings   Low 2 or more self limited/minor 1 stable chronic Acute uncomplicated illness Tests (PFTS) Non-CV imaging Arterial labs Biopsies of skin OTC drugs Minor surgery-no risk PT OT IVF without additives    Moderate 1 or more chronic illnesses w/ mild  exac, progression or S/E from tx 2 or more stable chronic illnesses Undiagnosed new problem w/ uncertain prognosis Acute complicated injury  Stress tests Endoscopies with no risk factors Deep needle or incisional bx CV imaging without risk LP Thoracentesis Paracentesis Minor surgery w/ risks Elective major surgery w/ no risk (open, percutaneous or endoscopic) Prescription drugs Therapeutic nucl med IVF with additives Closed tx of fracture/dislocation    High Severe exac of chronic illness Acute or chronic illness/injury may pose a threat to life or bodily function (ARF) Change in neuro status    CV imaging w/ contrast and risk Cardio electophysiologic tests Endoscopies w/ risk Discography Elective major surgery Emergency major surgery Parenteral controlled substances Drug therapy req monitoring for toxicity DNR/de-escalation of care    MDM Prob points Data points Risk   Straightforward    <1    <1    Min   Low complexity    2    2    Low   Moderate    3     3    Mod   High Complexity    4 or more    4 or more    High        LOS: 4 days   Sharay Bellissimo  [facility] Pager [phone]. If unable to reach me by pager, please call my cell phone at [phone].  *Please refer to [email or web address], password [email or web address] to get updated schedule on who will round on this patient, as hospitalists switch teams weekly. If 7PM-7AM, please contact night-coverage at [email or web address], password [email or web address] for any overnight needs.  08/27/2016, 8:48 AM

## 2016-08-27 NOTE — Evaluation (Signed)
Occupational Therapy Evaluation Patient Details Name: Kimberly ManilaSusan K Krasinski MRN: [id number] DOB: 03-Jul-1956 Today's Date: 08/27/2016    History of Present Illness 60 y.o.femaleadmittedon 12/13/2017with SOB, LLQ pain, and diarrhea. Principle problem indicated to be acute on chronic hypoxic respiratory failure.  Pt also reports falling at home on 08/19/16. PHM: stroke, DM, morbid obesity, chronic diastolic HF, COPD, hypertension, pulmonary hypertension, chronic edema/cellulitis BLE, CAD.    Clinical Impression   Pt. Wants to d/c home but is unable secondary to weakness. Pt. Husband works during day and pt. Is unable to stand or transfer I. Pt. Is an agreement with [facility] for rehab. Pt. Has had a decline in fu;nctin with ADLs and mobility. Pt. Is motivated to work with therapy to increase ability with self care and mobility to d/c home.    Follow Up Recommendations  SNF    Equipment Recommendations  None recommended by OT    Recommendations for Other Services       Precautions / Restrictions Precautions Precautions: Fall Precaution Comments: recent fall at home 08/19/16 Restrictions Weight Bearing Restrictions: No      Mobility Bed Mobility         Supine to sit: Min assist Sit to supine: Mod assist      Transfers Overall transfer level: Needs assistance Equipment used: Rolling walker (2 wheeled)   Sit to Stand: Max assist              Balance     Sitting balance-Leahy Scale: Fair       Standing balance-Leahy Scale: Poor                              ADL Overall ADL's : Needs assistance/impaired Eating/Feeding: Independent   Grooming: Wash/dry hands;Wash/dry face;Oral care;Brushing hair;Set up;Sitting   Upper Body Bathing: Supervision/ safety;Set up;Sitting   Lower Body Bathing: Total assistance;+2 for physical assistance;+2 for safety/equipment;Sit to/from stand   Upper Body Dressing : Minimal assistance;Sitting   Lower Body Dressing:  Total assistance;+2 for physical assistance;+2 for safety/equipment;Sit to/from stand                 General ADL Comments: Pt. is too weak to transfer currently. Pt. husband was assiting with ADLs. Pt. states she could do everything with equipment just not clean perineal area.       Vision     Perception     Praxis      Pertinent Vitals/Pain Pain Assessment: No/denies pain     Hand Dominance Right   Extremity/Trunk Assessment Upper Extremity Assessment Upper Extremity Assessment: Overall WFL for tasks assessed           Communication Communication Communication: No difficulties   Cognition Arousal/Alertness: Awake/alert Behavior During Therapy: WFL for tasks assessed/performed Overall Cognitive Status: Within Functional Limits for tasks assessed                     General Comments       Exercises       Shoulder Instructions      Home Living Family/patient expects to be discharged to:: Private residence Living Arrangements: Spouse/significant other Available Help at Discharge: Family;Available PRN/intermittently Type of Home: House Home Access: Ramped entrance     Home Layout: One level         Bathroom Toilet: Standard Bathroom Accessibility: No How Accessible:  (Pt. takes sink bath and uses BSC. Does not go into bathroom.) Home Equipment: Dan HumphreysWalker -  2 wheels;Walker - 4 wheels;Bedside commode;Wheelchair - manual   Additional Comments: Husband works and not available to assist. Does come home at lunch.       Prior Functioning/Environment Level of Independence: Needs assistance  Gait / Transfers Assistance Needed: would use rw to ambulate 4 feet to [facility]. Sitting in reclinor during the day.  ADL's / Homemaking Assistance Needed:  (Pt. husband assists with ADLs and toileting. )            OT Problem List: Decreased activity tolerance;Impaired balance (sitting and/or standing);Decreased knowledge of use of DME or AE;Obesity   OT  Treatment/Interventions: Self-care/ADL training;Energy conservation;DME and/or AE instruction;Therapeutic activities;Patient/family education;Balance training    OT Goals(Current goals can be found in the care plan section) Acute Rehab OT Goals Patient Stated Goal:  (Pt. wants to get stronger. ) OT Goal Formulation: With patient Time For Goal Achievement: 09/10/16 Potential to Achieve Goals: Good ADL Goals Pt Will Perform Lower Body Bathing: with min assist;with adaptive equipment;sit to/from stand Pt Will Perform Upper Body Dressing: with set-up;sitting Pt Will Perform Lower Body Dressing: with min assist;with adaptive equipment;sit to/from stand Pt Will Transfer to Toilet: with min guard assist;bedside commode;stand pivot transfer Pt Will Perform Toileting - Clothing Manipulation and hygiene: with min assist;with adaptive equipment;sit to/from stand  OT Frequency: Min 2X/week   Barriers to D/C: Decreased caregiver support   (Pt. husband works.)       Co-evaluation              End of Session Equipment Utilized During Treatment: Gait belt;Rolling walker  Activity Tolerance: Patient tolerated treatment well Patient left: in bed;with call bell/phone within reach   Time: 0932-1017 OT Time Calculation (min): 45 min Charges:  OT General Charges $OT Visit: 1 Procedure OT Evaluation $OT Eval Moderate Complexity: 1 Procedure OT Treatments $Self Care/Home Management : 23-37 mins G-Codes:    Peniel Hass 08/27/2016, 10:18 AM

## 2016-08-27 NOTE — Progress Notes (Signed)
ESRD pt on HD Currently off schedule, received dialysis Wed, Thurs, Sat this week Pt had vancomycin loading dose on 12/15. Vancomycin resumed for cellulitis Will give vancomycin 1 g IV x1 today and re-dose based on HD.  12/13 Ceftriaxone 2g x 1 dose  12/15 Vancomycin x1, 12/17 >> Zosyn 12/13 >>   12/12 BCx: 1/2 CoNS 12/13 MRSA PCR: positive 12/16 GI PCR: neg  Kimberly FridayMasters, Kimberly [name]  08/27/2016 4:23 PM

## 2016-08-27 NOTE — Progress Notes (Signed)
Physical Therapy Treatment Patient Details Name: Kimberly ManilaSusan K [name] MRN: [id number] DOB: 1956-04-27 Today's Date: 08/27/2016    History of Present Illness 60 y.o.femaleadmittedon 12/13/2017with SOB, LLQ pain, and diarrhea. Principle problem indicated to be acute on chronic hypoxic respiratory failure.  Pt also reports falling at home on 08/19/16. PHM: stroke, DM, morbid obesity, chronic diastolic HF, COPD, hypertension, pulmonary hypertension, chronic edema/cellulitis BLE, CAD.     PT Comments    Patient able to perform LE exercises.  Encouraged patient to perform exercises throughout day.  Patient is unable to return home and be safe alone.  Patient agreeable to SNF at d/c for continued therapy.  Follow Up Recommendations  SNF;Supervision for mobility/OOB     Equipment Recommendations  None recommended by PT    Recommendations for Other Services       Precautions / Restrictions Precautions Precautions: Fall Precaution Comments: recent fall at home 08/19/16 Restrictions Weight Bearing Restrictions: No    Mobility  Bed Mobility               General bed mobility comments: NT  Transfers                    Ambulation/Gait                 Stairs            Wheelchair Mobility    Modified Rankin (Stroke Patients Only)       Balance                                    Cognition Arousal/Alertness: Awake/alert Behavior During Therapy: WFL for tasks assessed/performed Overall Cognitive Status: Within Functional Limits for tasks assessed                      Exercises General Exercises - Lower Extremity Ankle Circles/Pumps: AROM;Both;15 reps;Supine Quad Sets: AROM;Both;15 reps;Supine Short Arc Quad: AROM;Both;15 reps;Supine Heel Slides: AROM;Both;10 reps;Supine Hip ABduction/ADduction: AROM;Both;10 reps;Supine Straight Leg Raises: AROM;Both;5 reps;Supine;Limitations Straight Leg Raises Limitations: Only able to move  lower part of legs off of bed.  Upper part of legs did not clear bed.    General Comments        Pertinent Vitals/Pain Pain Assessment: 0-10 Pain Score: 4  Pain Location: Rt leg Pain Descriptors / Indicators: Sore Pain Intervention(s): Monitored during session;Repositioned    Home Living                      Prior Function            PT Goals (current goals can now be found in the care plan section) Acute Rehab PT Goals Patient Stated Goal: go home Progress towards PT goals: Progressing toward goals    Frequency    Min 3X/week      PT Plan Discharge plan needs to be updated    Co-evaluation             End of Session   Activity Tolerance: Patient limited by fatigue Patient left: in bed;with call bell/phone within reach;with bed alarm set     Time: [id number] PT Time Calculation (min) (ACUTE ONLY): 15 min  Charges:  $Therapeutic Exercise: 8-22 mins                    G Codes:      Kimberly AustriaSusan H Joangel [name] 08/27/2016,  7:00 PM Kimberly HurtSusan H. Renaldo [name], PT, [facility] Acute Rehab Services Pager [phone]

## 2016-08-27 NOTE — Progress Notes (Signed)
[facility] Progress Note   Subjective: "I haven't had anymore prune juice but I'm still having some diarrhea". Has not been OOB to chair yet-refused to work with PT yesterday. Says she can't stand up.  Says her breathing has improved, decreased cough.  Objective Vitals:   08/26/16 2137 08/26/16 2343 08/27/16 0419 08/27/16 0907  BP: 107/66  (!) 86/32 (!) 111/45  Pulse: 81  81 85  Resp: 17  17 18   Temp: 98.9 F (37.2 C)  98.7 F (37.1 C) 98.1 F (36.7 C)  TempSrc: Oral  Oral Oral  SpO2: 95% (!) 0% 96% 100%  Weight:      Height:       Physical Exam General:Pleasant, obese female, NAD. On nasal oxygen. Heart: RRR; HS distant No M/G/R Lungs: CTAB Abdomen: tense, non-tender Extremities: B LE with woody edema with chronic stasis dermatitis. LLE warm to touch, appears more erythematous than 08/26/16 [facility]   Additional Objective Labs: Basic Metabolic Panel:  Recent Labs Lab 08/25/16 0501 08/26/16 0451 08/27/16 0205  NA 132* 131* 129*  K 4.3 4.7 4.1  CL 95* 94* 93*  CO2 26 21* 22  GLUCOSE 272* 166* 227*  BUN 24* 39* 17  CREATININE 3.76* 5.08* 3.29*  CALCIUM 8.7* 8.9 8.6*  PHOS 5.9* 7.6* 5.5*   Liver Function Tests:  Recent Labs Lab 08/23/16 0940  08/24/16 0329  08/25/16 0501 08/26/16 0451 08/27/16 0205  AST 15  --  13*  --   --   --   --   ALT 22  --  16  --   --   --   --   ALKPHOS 182*  --  156*  --   --   --   --   BILITOT 0.8  --  0.6  --   --   --   --   PROT 8.8*  --  7.4  --   --   --   --   ALBUMIN 3.3*  < > 2.5*  < > 2.3* 2.3* 2.1*  < > = values in this interval not displayed. No results for input(s): LIPASE, AMYLASE in the last 168 hours. CBC:  Recent Labs Lab 08/23/16 0940  08/24/16 0329 08/24/16 0908 08/25/16 0501 08/26/16 0451 08/27/16 0205  WBC 31.0*  < > 16.1* 18.3* 16.8* 15.4* 13.6*  NEUTROABS 29.2*  --   --   --   --   --   --   HGB 10.9*  < > 9.3* 10.0* 10.0* 9.9* 9.3*  HCT 32.9*  <  > 28.9* 30.3* 31.2* 31.5* 30.0*  MCV 102.2*  < > 101.8* 102.4* 105.1* 105.7* 105.3*  PLT 212  < > 162 159 199 195 185  < > = values in this interval not displayed. Blood Culture    Component Value Date/Time   SDES BLOOD HEMODIALYSIS CATHETER 08/23/2016 1600   SPECREQUEST BOTTLES DRAWN AEROBIC AND ANAEROBIC 10 ML 08/23/2016 1600   CULT (A) 08/23/2016 1600    STAPHYLOCOCCUS SPECIES (COAGULASE NEGATIVE) THE SIGNIFICANCE OF ISOLATING THIS ORGANISM FROM A SINGLE SET OF BLOOD CULTURES WHEN MULTIPLE SETS ARE DRAWN IS UNCERTAIN. PLEASE NOTIFY THE MICROBIOLOGY DEPARTMENT WITHIN ONE WEEK IF SPECIATION AND SENSITIVITIES ARE REQUIRED.    REPTSTATUS 08/26/2016 FINAL 08/23/2016 1600     CBG:  Recent Labs Lab 08/26/16 2335 08/27/16 0047 08/27/16 0121 08/27/16 0411 08/27/16 0925  GLUCAP 208* 210* 194* 225* 250*   Studies/Results: Dg Chest Port 1 [address]View  Result Date: 08/25/2016 CLINICAL DATA:  Follow-up pulmonary edema EXAM: PORTABLE CHEST 1 VIEW COMPARISON:  08/24/2016 FINDINGS: Cardiac shadow is mildly prominent and stable. Left jugular dialysis catheter is again seen. Lungs are clear bilaterally. No vascular congestion or pulmonary edema is seen. No effusions are noted. IMPRESSION: No active disease. Electronically Signed   By: Alcide CleverMark  Lukens M.D.   On: 08/25/2016 17:13   Dg Knee Right Port  Result Date: 08/26/2016 CLINICAL DATA:  Knee pain without trauma EXAM: PORTABLE RIGHT KNEE - 1-2 VIEW COMPARISON:  None. FINDINGS: Vascular calcifications. Degenerative changes in the patellofemoral compartment. No fracture, dislocation, or joint effusion. IMPRESSION: No acute abnormalities. Electronically Signed   By: Gerome Samavid  Williams III M.D   On: 08/26/2016 18:03   Medications:  . aspirin EC  81 mg Oral Daily  . Chlorhexidine Gluconate Cloth  6 each Topical Q0600  . [START ON 08/28/2016] doxercalciferol  2 mcg Intravenous Q M,W,F-HD  . feeding supplement (NEPRO CARB STEADY)  237 mL Oral BID BM  .  heparin subcutaneous  5,000 Units Subcutaneous Q8H  . insulin aspart  0-15 Units Subcutaneous TID WC  . insulin aspart  0-5 Units Subcutaneous QHS  . insulin aspart  4 Units Subcutaneous TID WC  . insulin glargine  25 Units Subcutaneous BID  . levothyroxine  75 mcg Oral QAC breakfast  . mouth rinse  15 mL Mouth Rinse BID  . metoCLOPramide (REGLAN) injection  10 mg Intravenous Q12H  . midodrine  10 mg Oral Daily  . multivitamin  1 tablet Oral QHS  . mupirocin ointment  1 application Nasal BID  . nystatin   Topical TID  . piperacillin-tazobactam (ZOSYN)  IV  3.375 g Intravenous Q12H  . sevelamer carbonate  2.4 g Oral TID WC  . sodium chloride flush  3 mL Intravenous Q12H   Dialysis Orders: [facility] MWF 4.5 hours 400/auto 1.5  126.5 kg 2.0 K/2.25 Ca UF Profile 2  Linear Na Heparin 6000 units IV initial bolus/ Heparin 3000 units IV mid run Hectoral 2 mcg IV q treatment (last PTH 450 07/05/16) Mircera 50 mcg IV q 4 weeks (last dose 07/26/16 Last HGB 10.7 (08/09/16) Last Fe 96 Tsat 42% 08/01/16.  Assessment/Plan: 1. Diarrhea/ Reina Fuse^^WBC: Still having diarrhea/however she told attending she took prune juice for constipation. GI Panel pending. Cont zosyn. WBC 15.4 down from 31.0 on adm. + MRSA by PCR. Contact precautions.  2. Staph Bacteremia: 1 of 2 BCx 12/13 turned positive for Staph. Thought to be contaminant. Rec'd 1 dose of vanc-vanc has been dc'd per pharmacy.  2. ESRD: MWF schedule as OP. HD off schedule today. Back on schedule Monday.  3. Chronic hypotension/volume: On midodrine. CHF on initial CXR.  CXR 08/25/16 showed resolution of vascular congestion.  HD yesterday -pre wt 127.6 Net UF 2800 No post wt recorded. UF to OP EDW tomorrow. Add daily wts.  4. Anemia: Hgb 9.3 slow decline. Start aranesp 25 mcg IV with HD tomorrow.. 5. Secondary hyperparathyroidism: Ca 8.6 Phos now 5.5 Pt takes Fosrenol pwdr at home, will start Renvela powder while here. Overhead pt refusing binders however talked to  pt and she agrees to try it sprinkled on food. .  6. Nutrition: Alb 2.3, adding Nepro supplements. 7. DM: Per primary. BS ^ 8. COPD: On chronic oxygen. Per primary. 9. Atrial fibrillation: not on anticoagulation (asa only) d/t prior vaginal bleed. Per primary. 10. Debility: Morbid obesity/weakness-refusing to work with PT. Says she will cooperate with PT. Work on getting  her up into chair.  11. Possible cellulits LLE. Has chronic woody edema/statis dermatitis but LLE appears more erythematous, is warm to touch. Will notify primary.     Rita H. Brown NP-C 08/27/2016, 10:37 AM  [facility] [phone]  Pt seen, examined and agree w A/P as above. Vol overload better/ resolved.  Still w diarrhea; unclear cause of ^^WBC/ diarrhea, WBC comgin down on IV zosyn.   [name] MD [employer] pager [phone]   08/27/2016, 3:01 PM

## 2016-08-28 DIAGNOSIS — I2510 Atherosclerotic heart disease of native coronary artery without angina pectoris: Secondary | ICD-10-CM

## 2016-08-28 DIAGNOSIS — R748 Abnormal levels of other serum enzymes: Secondary | ICD-10-CM

## 2016-08-28 DIAGNOSIS — R079 Chest pain, unspecified: Secondary | ICD-10-CM | POA: Diagnosis not present

## 2016-08-28 DIAGNOSIS — I5033 Acute on chronic diastolic (congestive) heart failure: Secondary | ICD-10-CM

## 2016-08-28 DIAGNOSIS — N186 End stage renal disease: Secondary | ICD-10-CM

## 2016-08-28 DIAGNOSIS — R778 Other specified abnormalities of plasma proteins: Secondary | ICD-10-CM | POA: Diagnosis present

## 2016-08-28 DIAGNOSIS — R7989 Other specified abnormal findings of blood chemistry: Secondary | ICD-10-CM

## 2016-08-28 DIAGNOSIS — I480 Paroxysmal atrial fibrillation: Secondary | ICD-10-CM

## 2016-08-28 DIAGNOSIS — I447 Left bundle-branch block, unspecified: Secondary | ICD-10-CM

## 2016-08-28 LAB — CBC
HCT: 31.9 % — ABNORMAL LOW (ref 36.0–46.0)
Hemoglobin: 10.2 g/dL — ABNORMAL LOW (ref 12.0–15.0)
MCH: 33.1 pg (ref 26.0–34.0)
MCHC: 32 g/dL (ref 30.0–36.0)
MCV: 103.6 fL — AB (ref 78.0–100.0)
PLATELETS: 193 10*3/uL (ref 150–400)
RBC: 3.08 MIL/uL — ABNORMAL LOW (ref 3.87–5.11)
RDW: 16.1 % — ABNORMAL HIGH (ref 11.5–15.5)
WBC: 15.3 10*3/uL — ABNORMAL HIGH (ref 4.0–10.5)

## 2016-08-28 LAB — RENAL FUNCTION PANEL
Albumin: 2.3 g/dL — ABNORMAL LOW (ref 3.5–5.0)
Anion gap: 18 — ABNORMAL HIGH (ref 5–15)
BUN: 35 mg/dL — ABNORMAL HIGH (ref 6–20)
CO2: 23 mmol/L (ref 22–32)
Calcium: 8.9 mg/dL (ref 8.9–10.3)
Chloride: 90 mmol/L — ABNORMAL LOW (ref 101–111)
Creatinine, Ser: 5.08 mg/dL — ABNORMAL HIGH (ref 0.44–1.00)
GFR calc Af Amer: 10 mL/min — ABNORMAL LOW (ref >60)
GFR calc non Af Amer: 8 mL/min — ABNORMAL LOW (ref >60)
Glucose, Bld: 173 mg/dL — ABNORMAL HIGH (ref 65–99)
Phosphorus: 7.7 mg/dL — ABNORMAL HIGH (ref 2.5–4.6)
Potassium: 3.9 mmol/L (ref 3.5–5.1)
Sodium: 131 mmol/L — ABNORMAL LOW (ref 135–145)

## 2016-08-28 LAB — GLUCOSE, CAPILLARY
GLUCOSE-CAPILLARY: 224 mg/dL — AB (ref 65–99)
GLUCOSE-CAPILLARY: 286 mg/dL — AB (ref 65–99)
Glucose-Capillary: 125 mg/dL — ABNORMAL HIGH (ref 65–99)

## 2016-08-28 LAB — CULTURE, BLOOD (ROUTINE X 2): CULTURE: NO GROWTH

## 2016-08-28 LAB — TROPONIN I
TROPONIN I: 0.08 ng/mL — AB (ref <0.03)
TROPONIN I: 0.09 ng/mL — AB (ref <0.03)
TROPONIN I: 0.13 ng/mL — AB (ref <0.03)

## 2016-08-28 MED ORDER — PENTAFLUOROPROP-TETRAFLUOROETH EX AERO: 1.0000 "application " | INHALATION_SPRAY | CUTANEOUS | Status: DC | PRN

## 2016-08-28 MED ORDER — SODIUM CHLORIDE 0.9 % IV SOLN: 100.0000 mL | INTRAVENOUS | Status: DC | PRN

## 2016-08-28 MED ORDER — LIDOCAINE-PRILOCAINE 2.5-2.5 % EX CREA: 1.0000 "application " | TOPICAL_CREAM | CUTANEOUS | Status: DC | PRN

## 2016-08-28 MED ORDER — ALTEPLASE 2 MG IJ SOLR: 2.0000 mg | Freq: Once | INTRAMUSCULAR | Status: DC | PRN

## 2016-08-28 MED ORDER — AMOXICILLIN-POT CLAVULANATE 500-125 MG PO TABS
1.0000 | ORAL_TABLET | Freq: Every day | ORAL | Status: DC
  Administered 2016-08-28 – 2016-08-30 (×3): 500 mg via ORAL
  Filled 2016-08-28 (×4): qty 1

## 2016-08-28 MED ORDER — HEPARIN SODIUM (PORCINE) 1000 UNIT/ML DIALYSIS
6000.0000 [IU] | Freq: Once | INTRAMUSCULAR | Status: AC
  Administered 2016-08-28: 6000 [IU] via INTRAVENOUS_CENTRAL
  Filled 2016-08-28: qty 6

## 2016-08-28 MED ORDER — VANCOMYCIN HCL IN DEXTROSE 1-5 GM/200ML-% IV SOLN
INTRAVENOUS | Status: AC
  Filled 2016-08-28: qty 200

## 2016-08-28 MED ORDER — DOXERCALCIFEROL 4 MCG/2ML IV SOLN
INTRAVENOUS | Status: AC
  Filled 2016-08-28: qty 2

## 2016-08-28 MED ORDER — HEPARIN SODIUM (PORCINE) 1000 UNIT/ML DIALYSIS: 1000.0000 [IU] | INTRAMUSCULAR | Status: DC | PRN

## 2016-08-28 MED ORDER — MIDODRINE HCL 5 MG PO TABS
ORAL_TABLET | ORAL | Status: AC
  Filled 2016-08-28: qty 2

## 2016-08-28 MED ORDER — VANCOMYCIN HCL IN DEXTROSE 1-5 GM/200ML-% IV SOLN
1000.0000 mg | INTRAVENOUS | Status: DC
  Administered 2016-08-28 – 2016-08-30 (×2): 1000 mg via INTRAVENOUS
  Filled 2016-08-28: qty 200

## 2016-08-28 MED ORDER — LIDOCAINE HCL (PF) 1 % IJ SOLN: 5.0000 mL | INTRAMUSCULAR | Status: DC | PRN

## 2016-08-28 MED ORDER — INSULIN GLARGINE 100 UNIT/ML ~~LOC~~ SOLN
28.0000 [IU] | Freq: Two times a day (BID) | SUBCUTANEOUS | Status: DC
  Administered 2016-08-28 – 2016-08-29 (×2): 28 [IU] via SUBCUTANEOUS
  Filled 2016-08-28 (×3): qty 0.28

## 2016-08-28 NOTE — Progress Notes (Signed)
Inpatient Diabetes Program Recommendations  AACE/ADA: New Consensus Statement on Inpatient Glycemic Control (2015)  Target Ranges:  Prepandial:   less than 140 mg/dL      Peak postprandial:   less than 180 mg/dL (1-2 hours)      Critically ill patients:  140 - 180 mg/dL   Results for Kimberly [name], [name] (MRN [id number]) as of 08/28/2016 09:56  Ref. Range 08/27/2016 04:11 08/27/2016 09:25 08/27/2016 12:15 08/27/2016 16:52 08/27/2016 20:51  Glucose-Capillary Latest Ref Range: 65 - 99 mg/dL 409225 (H) 811250 (H) 914378 (H) 382 (H) 251 (H)   Review of Glycemic Control  Diabetes history: DM 2 Outpatient Diabetes medications: Novolog 6 units TID meal coverage, Lantus 60 units BID Current orders for Inpatient glycemic control: Lantus 25 units BID, Novolog Moderate + Novolog HS scale + Novolog 4 units TID  Inpatient Diabetes Program Recommendations:   Glucose in the 200-300 range. Patient takes higher Lantus doses at home. Please consider increasing basal insulin to Lantus 28-30 units BID.  Thanks, Christena DeemShannon Malania Gawthrop RN, MSN, [facility] Inpatient Diabetes Coordinator Team Pager [phone] (8a-5p)

## 2016-08-28 NOTE — Procedures (Signed)
Tol HD BP remains soft. Remains on Midodrine.  Reports persistent diarrhea. Will cont to support.   Kimberly [name] C

## 2016-08-28 NOTE — Consult Note (Signed)
Reason for Consult:   Chest pain  Requesting Physician: Dr Darnelle Catalan Primary Cardiologist Dr Jens Som  HPI:   60 y/o morbidly obese female admitted 08/23/16 with volume overload. She has a history of moderate CAD 2011, ESRD on HD, DM, chronic diastolic CHF, pulmonary HTN, PAF (holding NSR), unable to tolerate Coumadin secondary to vaginal bleeding, and chronic orthostatic hypotension. Yesterday she was up with PT and her B/P dropped and she had chest pain "tightness". Her Troponin was 0.09, 0.13. She is pain free now.  PMHx:  Past Medical History:  Diagnosis Date  . Anemia   . Anxiety   . Arthritis    "knees, feet, hands" (08/23/2016)  . Asthma   . CAD, NATIVE VESSEL    May 10, 2010 cath showed a hyperdynamic LV function, she had dominant circumflex anatomy with a 70-80% small OM1. She had diffuse diabetic plaque particularly in the distal LAD. She nondominant RCA.  Nondominant  . Cellulitis 10/15/2013  . CHF (congestive heart failure) (HCC)    Preserved EF  . Chronic bronchitis (HCC)    "probably once/ yr" (08/23/2016)  . Complication of anesthesia    "I've had difficulty waking up" (08/23/2016)  . COPD   . Depression   . Diabetic neuropathy (HCC)   . Endometrial hyperplasia   . ESRD (end stage renal disease) on dialysis (HCC)    "[facility]; MWF; [facility]" (08/23/2016)  . GERD (gastroesophageal reflux disease)   . History of hiatal hernia   . HYPERLIPIDEMIA   . HYPERTENSION   . Hypothyroidism   . On home oxygen therapy    "2L; 24/7" (08/23/2016)  . Paralyzed vocal cords   . Peripheral neuropathy (HCC)    hx/notes 01/27/2010  . Pneumonia "several times"  . Proliferative retinopathy    hx/notes 01/27/2010  . PVD    CEA  . Restless leg syndrome    "mostly on the right" (08/23/2016)  . Sleep apnea    not on cpap    . 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 Type II diabetes mellitus (HCC)     Past Surgical History:  Procedure Laterality Date  . AV FISTULA PLACEMENT Right 07/08/2015   Procedure: EXPLORATION RIGHT AXILLARY ARTERY AND RIGHT BRACHIAL VEIN;  Surgeon: Chuck Hint, MD;  Location: [facility] OR;  Service: Vascular;  Laterality: Right;  . CAROTID ENDARTERECTOMY Left X 2  . CATARACT EXTRACTION W/ INTRAOCULAR LENS  IMPLANT, BILATERAL Bilateral   . CERVICAL BIOPSY  ~ 2015   "precancerous cells"  . COLONOSCOPY    . EYE SURGERY Bilateral    numerous surgeries  . INSERTION OF DIALYSIS CATHETER N/A 06/24/2015   Procedure: ULTRASOUND BILATERAL INTERNAL JUGULAR VEIN INSERTION OF DIALYSIS CATHETER LEFT INTERNAL JUGULAR VEIN ;  Surgeon: Pryor Ochoa, MD;  Location: [facility] OR;  Service: Vascular;  Laterality: N/A;  . INTRAUTERINE DEVICE INSERTION  ~ 2015  . VITRECTOMY Bilateral     SOCHx:  reports that she quit smoking about 10 years ago. Her smoking use included Cigarettes. She has a 20.00 pack-year smoking history. She has never used smokeless tobacco. She reports that she does not drink alcohol or use drugs.  FAMHx: Family History  Problem Relation Age of Onset  . Cancer Mother     Breast, NHL  . Stroke Mother   . Peripheral vascular  disease Father   . CAD Father 2830  . Heart attack Father   . Hypertension Father   . Asthma Father   . Heart disease Father     before age 61    ALLERGIES: Allergies  Allergen Reactions  . Ciprofloxacin Itching  . Epinephrine     Increased heart rate    ROS: Review of Systems: General: negative for chills, fever, night sweats or weight changes.  Cardiovascular: see above  HEENT: negative for any visual disturbances, blindness, glaucoma Dermatological: negative for rash Respiratory: negative for cough, hemoptysis, or wheezing Urologic: negative for hematuria or dysuria Abdominal: negative for nausea, vomiting, diarrhea, bright red blood per rectum, melena, or  hematemesis Neurologic: negative for visual changes, syncope, or dizziness Musculoskeletal: negative for back pain, joint pain, or swelling Psych: cooperative and appropriate All other systems reviewed and are otherwise negative except as noted above.   HOME MEDICATIONS: Prior to Admission medications   Medication Sig Start Date End Date Taking? Authorizing Provider  acetaminophen (TYLENOL) 325 MG tablet Take 650 mg by mouth every 6 (six) hours as needed for mild pain.   Yes Historical Provider, MD  aspirin EC 81 MG tablet Take 81 mg by mouth daily.   Yes Historical Provider, MD  docusate sodium (COLACE) 100 MG capsule Take 100 mg by mouth daily as needed for mild constipation.   Yes Historical Provider, MD  hydrocerin (EUCERIN) CREA Apply 1 application topically 2 (two) times daily. 07/20/15  Yes Nishant Dhungel, MD  insulin aspart (NOVOLOG) 100 UNIT/ML injection Inject 6 Units into the skin 3 (three) times daily with meals. 07/05/14  Yes Zannie CovePreetha Joseph, MD  insulin glargine (LANTUS) 100 UNIT/ML injection Inject 60 Units into the skin 2 (two) times daily.    Yes Historical Provider, MD  levonorgestrel (MIRENA) 20 MCG/24HR IUD 1 Intra Uterine Device (1 each total) by Intrauterine route once. 08/16/15  Yes Adolphus BirchwoodEmma Rossi, MD  levothyroxine (SYNTHROID, LEVOTHROID) 75 MCG tablet Take 75 mcg by mouth daily before breakfast.   Yes Historical Provider, MD  midodrine (PROAMATINE) 10 MG tablet Take 1 tablet (10 mg total) by mouth 2 (two) times daily with a meal. Patient taking differently: Take 10 mg by mouth every other day.  07/20/15  Yes Nishant Dhungel, MD  multivitamin (RENA-VIT) TABS tablet Take 1 tablet by mouth daily.   Yes Historical Provider, MD  OXYGEN 2lpm 24/7- [facility]   Yes Historical Provider, MD  sodium chloride (OCEAN) 0.65 % SOLN nasal spray Place 1 spray into both nostrils as needed for congestion.   Yes Historical Provider, MD    HOSPITAL MEDICATIONS: I have reviewed the patient's current  medications.  VITALS: Blood pressure (!) 102/30, pulse 81, temperature 98.1 F (36.7 C), temperature source Oral, resp. rate 18, height 5\' 4"  (1.626 m), weight 253 lb 8.5 oz (115 kg), SpO2 100 %.  PHYSICAL EXAM: General appearance: alert, cooperative, no distress and morbidly obese Lungs: decreased breath sounds Heart: regular rate and rhythm Abdomen: obese Extremities: chronic LE edema with skin changes Pulses: diminnished Skin: Skin color, texture, turgor normal. No rashes or lesions Neurologic: Grossly normal  LABS: Results for orders placed or performed during the hospital encounter of 08/23/16 (from the past 24 hour(s))  Glucose, capillary     Status: Abnormal   Collection Time: 08/27/16  8:51 PM  Result Value Ref Range   Glucose-Capillary 251 (H) 65 - 99 mg/dL  Blood gas, arterial     Status: Abnormal   Collection Time: 08/27/16  11:45 PM  Result Value Ref Range   O2 Content 2.0 L/min   Delivery systems NASAL CANNULA    pH, Arterial 7.309 (L) 7.350 - 7.450   pCO2 arterial 45.8 32.0 - 48.0 mmHg   pO2, Arterial 94.0 83.0 - 108.0 mmHg   Bicarbonate 22.3 20.0 - 28.0 mmol/L   Acid-base deficit 3.0 (H) 0.0 - 2.0 mmol/L   O2 Saturation 96.0 %   Patient temperature 98.6    Collection site RIGHT RADIAL    Drawn by [phone]    Sample type ARTERIAL DRAW    Allens test (pass/fail) PASS PASS  Troponin I (q 6hr x 3)     Status: Abnormal   Collection Time: 08/28/16 12:11 AM  Result Value Ref Range   Troponin I 0.08 (HH) <0.03 ng/mL  Troponin I (q 6hr x 3)     Status: Abnormal   Collection Time: 08/28/16  5:45 AM  Result Value Ref Range   Troponin I 0.09 (HH) <0.03 ng/mL  Renal function panel     Status: Abnormal   Collection Time: 08/28/16  7:58 AM  Result Value Ref Range   Sodium 131 (L) 135 - 145 mmol/L   Potassium 3.9 3.5 - 5.1 mmol/L   Chloride 90 (L) 101 - 111 mmol/L   CO2 23 22 - 32 mmol/L   Glucose, Bld 173 (H) 65 - 99 mg/dL   BUN 35 (H) 6 - 20 mg/dL   Creatinine, Ser  6.045.08 (H) 0.44 - 1.00 mg/dL   Calcium 8.9 8.9 - 54.010.3 mg/dL   Phosphorus 7.7 (H) 2.5 - 4.6 mg/dL   Albumin 2.3 (L) 3.5 - 5.0 g/dL   GFR calc non Af Amer 8 (L) >60 mL/min   GFR calc Af Amer 10 (L) >60 mL/min   Anion gap 18 (H) 5 - 15  CBC     Status: Abnormal   Collection Time: 08/28/16  8:00 AM  Result Value Ref Range   WBC 15.3 (H) 4.0 - 10.5 K/uL   RBC 3.08 (L) 3.87 - 5.11 MIL/uL   Hemoglobin 10.2 (L) 12.0 - 15.0 g/dL   HCT 98.131.9 (L) 19.136.0 - 47.846.0 %   MCV 103.6 (H) 78.0 - 100.0 fL   MCH 33.1 26.0 - 34.0 pg   MCHC 32.0 30.0 - 36.0 g/dL   RDW 29.516.1 (H) 62.111.5 - 30.815.5 %   Platelets 193 150 - 400 K/uL  Troponin I (q 6hr x 3)     Status: Abnormal   Collection Time: 08/28/16 11:41 AM  Result Value Ref Range   Troponin I 0.13 (HH) <0.03 ng/mL  Glucose, capillary     Status: Abnormal   Collection Time: 08/28/16 12:37 PM  Result Value Ref Range   Glucose-Capillary 125 (H) 65 - 99 mg/dL  Glucose, capillary     Status: Abnormal   Collection Time: 08/28/16  4:10 PM  Result Value Ref Range   Glucose-Capillary 224 (H) 65 - 99 mg/dL    EKG: NSR, LBBB  IMAGING: No results found.  IMPRESSION: Active Problems:   DM (diabetes mellitus), type 2 with renal complications (HCC)   Hypertensive heart disease   CAD (coronary artery disease), native coronary artery   COPD (chronic obstructive pulmonary disease) (HCC)   Chronic diastolic CHF (congestive heart failure) (HCC)   Chronic respiratory failure with hypoxia (HCC)   On home oxygen therapy   Hypothyroidism   Morbid obesity (HCC)   Physical deconditioning   Obesity hypoventilation syndrome (HCC)   ESRD (end  stage renal disease) (HCC)   Paroxysmal atrial fibrillation (HCC)   Pulmonary arterial hypertension   Acute on chronic diastolic congestive heart failure (HCC)   Vaginal bleeding   Acute respiratory failure with hypoxemia (HCC)   Hypotension   LBBB (left bundle branch block)   Hyperkalemia   Shortness of breath   Hyponatremia    MRSA carrier   Chest pain   Elevated troponin   RECOMMENDATION: Observe. Myoview would not be helpful with obesity. MD to see.   Time Spent Directly with Patient: [address] minutes  [place], [place]  [phone] beeper 08/28/2016, 5:04 PM

## 2016-08-28 NOTE — Progress Notes (Signed)
At 2244, patient manual B/P was 82/38.  Upon assessment, patient is drowsy, c/o dizziness, c/o SOB (02 Sats WNL on O2 @ 2 LPM [place]), and chest pressure, rating as 7/10.  Per Dr. Antionette Charpyd, ABG and Troponin ordered.  At 0047, B/P was 92/42.  Patient continues to c/o chest pressure, 7/10.  At 0140, Troponin of 0.08 called to Dr. Antionette Charpyd.  Patient states that chest pressure relieved and slight SOB better.  Per MD, continue to monitor patient.  No additional orders received.  At 0212, B/P was 100/36.  At 0456, B/P was 93/22.  Patient currently has no c/o chest pressure or SOB.  Dizziness still persists.  Will continue to monitor patient.  Bernie CoveyKimberly Clementine Soulliere RN-BC, [employer]

## 2016-08-28 NOTE — Progress Notes (Signed)
Progress Note    Kimberly ManilaSusan K Fortson  ZHY:[id number] DOB: 1956/08/26  DOA: 08/23/2016 PCP: Ginette OttoSTONEKING,[name] THOMAS, MD    Brief Narrative:   Chief complaint: Follow-up weakness, shortness of breath  Kimberly [name] is an 60 y.o. female with a PMH of end-stage renal disease on HD, COPD, hypertension, CAD and type 2 diabetes who was admitted 08/23/16 with a chief complaint of shortness of breath, left lower quadrant pain and diarrhea. Of note, she missed her last HD treatment secondary to the diarrhea. Upon initial evaluation in the ED, chest x-ray showed mild CHF. CT of the abdomen and pelvis was negative for acute changes.  Assessment/Plan:   Principal problem:  Acute on chronic hypoxic respiratory failure, multifactorial Most  likely secondary to flluid overload after missing HD last Saturday (T/Th/S) . History of CHF and COPD on O2 at home  CXR showed cardiomegaly, CHF and mild pulmonary interstitial edema on admission but no frank pulmonary edema after dialysis. I don't see any evidence of pneumonia. 2D echo 06/2015 EF 55 to 60%, normal LV function, grade 2 diastolic dysfunction.    Leukocytosis ? GI illness versus cellulits WBC 31 on admission, likely related to underlying infection, GI source versus cellulits. Recent diarrhea with abdominal pain.  GI pathogen panel negative. CXR not indicative of infiltrative process. CT abdomen and pelvis negative for acute findings. One blood culture is growing a Coagulase-negative staph, felt to be a contaminant. Afebrile with improved WBC. Continue Zosyn. Vancomycin added 08/27/16 due to concerns for source of leukocytosis being from cellulitis of lower extremities.  Active problems:  Chest pain associated with hypotension/elevated troponins  Suspect demand ischemia related to hypotension. Continue aspirin. Trend slightly up, but asymptomatic at present. Check 12-lead EKG. Will ask cardiology to see nonurgently.   Diarrhea Patient had diarrhea prior  to admission but none subsequently until today when she had several loose stools after being given prune juice. Doubt C. difficile, but given preadmission complaints, would go ahead and do a GI pathogen evaluation.   End-stage renal disease HD per nephrology.   Atrial Fibrillation  CHA2DS2-VASc score 6, not on anticoagulation due to prior history of vaginal bleed. Rate controlled. Continue aspirin.  Anemia of chronic disease  Hemoglobin on admission 10.9 which is consistent with her usual baseline.    Type II Diabetes, uncontrolled, with complications, long-term insulin use Hemoglobin A1c 9.9% indicating suboptimal outpatient control. Currently being managed with 25 units of Lantus twice a day and moderate sliding scale 3 times a day with 4 units of meal coverate. CBGs [id number]. Change SSI to moderate scale with 4 units of meal coverage. Increased Lantus to 28 units twice a day.  Hypothyroidism: Continue home Synthroid.  History of Orthostatic hypotension, persistent hypotension  Increase midodrine to daily dosing.   Physical Deconditioning OT/PT evaluations requested.  MRSA carrier Contact precautions. Decontamination therapy ordered.  Right knee pain s/p fall Images reviewed. No evidence of fracture/dislocation.   Family Communication/Anticipated D/C date and plan/Code Status   DVT prophylaxis: Heparin ordered. Code Status: Full Code.  Family Communication: No family at the bedside. Disposition Plan: Will likely need SNF placement.   Medical Consultants:    Nephrology  Cardiology   Procedures:    None  Anti-Infectives:    Zosyn 08/23/16--->  Rocephin 08/23/16---> 08/24/16  Subjective:   The patient reports that she Had an episode of chest pain last night that was associated with low blood pressure. Cross covering provider ordered serial troponins, which were mildly  elevated. Currently denies any chest pain or pressure.  Objective:    Vitals:    08/28/16 0714 08/28/16 0730 08/28/16 0800 08/28/16 0830  BP: (!) 107/51 (!) 94/46 (!) 96/44 (!) 108/46  Pulse: 80 82 86 87  Resp: 20 18 (!) 22 (!) 22  Temp:      TempSrc:      SpO2: 96%     Weight:      Height:        Intake/Output Summary (Last 24 hours) at 08/28/16 0834 Last data filed at 08/28/16 0600  Gross per 24 hour  Intake             1137 ml  Output                0 ml  Net             1137 ml   Filed Weights   08/26/16 0220 08/26/16 0709 08/28/16 0704  Weight: 125.3 kg (276 lb 3.8 oz) 127.6 kg (281 lb 4.9 oz) 115.4 kg (254 lb 6.6 oz)    Exam: General exam: Appears calm and comfortable. Morbidly obese. Respiratory system: Clear to auscultation, diminished in the bases. Respiratory effort normal. Cardiovascular system: S1 & S2 heard, RRR. No JVD,  rubs, gallops or clicks. No murmurs. Gastrointestinal system: Abdomen is nondistended, soft and nontender. No organomegaly or masses felt. Normal bowel sounds heard. Central nervous system: Alert and oriented. No focal neurological deficits. Extremities: As pictured below, Unchanged. Skin: Warm and dry with chronic venous stasis dermatitis. Psychiatry: Judgement and insight appear Mildly impaired. Mood & affect flat.      Data Reviewed:   I have personally reviewed following labs and imaging studies:  Labs: Basic Metabolic Panel:  Recent Labs Lab 08/24/16 0908 08/25/16 0501 08/26/16 0451 08/27/16 0205 08/28/16 0758  NA 132* 132* 131* 129* 131*  K 5.3* 4.3 4.7 4.1 3.9  CL 96* 95* 94* 93* 90*  CO2 19* 26 21* 22 23  GLUCOSE 116* 272* 166* 227* 173*  BUN 40* 24* 39* 17 35*  CREATININE 5.31* 3.76* 5.08* 3.29* 5.08*  CALCIUM 8.6* 8.7* 8.9 8.6* 8.9  PHOS 6.4* 5.9* 7.6* 5.5* 7.7*   GFR Estimated Creatinine Clearance: 14.5 mL/min (by C-G formula based on SCr of 5.08 mg/dL (H)). Liver Function Tests:  Recent Labs Lab 08/23/16 0940  08/24/16 0329 08/24/16 0908 08/25/16 0501 08/26/16 0451 08/27/16 0205  08/28/16 0758  AST 15  --  13*  --   --   --   --   --   ALT 22  --  16  --   --   --   --   --   ALKPHOS 182*  --  156*  --   --   --   --   --   BILITOT 0.8  --  0.6  --   --   --   --   --   PROT 8.8*  --  7.4  --   --   --   --   --   ALBUMIN 3.3*  < > 2.5* 2.6* 2.3* 2.3* 2.1* 2.3*  < > = values in this interval not displayed. CBC:  Recent Labs Lab 08/23/16 0940  08/24/16 0329 08/24/16 0908 08/25/16 0501 08/26/16 0451 08/27/16 0205  WBC 31.0*  < > 16.1* 18.3* 16.8* 15.4* 13.6*  NEUTROABS 29.2*  --   --   --   --   --   --  HGB 10.9*  < > 9.3* 10.0* 10.0* 9.9* 9.3*  HCT 32.9*  < > 28.9* 30.3* 31.2* 31.5* 30.0*  MCV 102.2*  < > 101.8* 102.4* 105.1* 105.7* 105.3*  PLT 212  < > 162 159 199 195 185  < > = values in this interval not displayed. CBG:  Recent Labs Lab 08/27/16 0411 08/27/16 0925 08/27/16 1215 08/27/16 1652 08/27/16 2051  GLUCAP 225* 250* 378* 382* 251*   Cardiac Panel (last 3 results)  Recent Labs  08/28/16 0011 08/28/16 0545 08/28/16 1141  TROPONINI 0.08* 0.09* 0.13*   Hgb A1c: No results for input(s): HGBA1C in the last 72 hours. Microbiology Recent Results (from the past 240 hour(s))  Culture, blood (Routine X 2) w Reflex to ID Panel     Status: None (Preliminary result)   Collection Time: 08/23/16  3:45 PM  Result Value Ref Range Status   Specimen Description HEMODIALYSIS CATHETER  Final   Special Requests BOTTLES DRAWN AEROBIC AND ANAEROBIC UNK  Final   Culture NO GROWTH 4 DAYS  Final   Report Status PENDING  Incomplete  Culture, blood (Routine X 2) w Reflex to ID Panel     Status: Abnormal   Collection Time: 08/23/16  4:00 PM  Result Value Ref Range Status   Specimen Description BLOOD HEMODIALYSIS CATHETER  Final   Special Requests BOTTLES DRAWN AEROBIC AND ANAEROBIC 10 ML  Final   Culture  Setup Time   Final    GRAM POSITIVE COCCI IN CLUSTERS AEROBIC BOTTLE ONLY CRITICAL RESULT CALLED TO, READ BACK BY AND VERIFIED WITHMerlene Morse  PHARMD 0535 08/25/16 A BROWNING    Culture (A)  Final    STAPHYLOCOCCUS SPECIES (COAGULASE NEGATIVE) THE SIGNIFICANCE OF ISOLATING THIS ORGANISM FROM A SINGLE SET OF BLOOD CULTURES WHEN MULTIPLE SETS ARE DRAWN IS UNCERTAIN. PLEASE NOTIFY THE MICROBIOLOGY DEPARTMENT WITHIN ONE WEEK IF SPECIATION AND SENSITIVITIES ARE REQUIRED.    Report Status 08/26/2016 FINAL  Final  Blood Culture ID Panel (Reflexed)     Status: Abnormal   Collection Time: 08/23/16  4:00 PM  Result Value Ref Range Status   Enterococcus species NOT DETECTED NOT DETECTED Final   Listeria monocytogenes NOT DETECTED NOT DETECTED Final   Staphylococcus species DETECTED (A) NOT DETECTED Final    Comment: CRITICAL RESULT CALLED TO, READ BACK BY AND VERIFIED WITHMerlene Morse PHARMD 0535 08/25/16 A BROWNING    Staphylococcus aureus NOT DETECTED NOT DETECTED Final   Methicillin resistance NOT DETECTED NOT DETECTED Final   Streptococcus species NOT DETECTED NOT DETECTED Final   Streptococcus agalactiae NOT DETECTED NOT DETECTED Final   Streptococcus pneumoniae NOT DETECTED NOT DETECTED Final   Streptococcus pyogenes NOT DETECTED NOT DETECTED Final   Acinetobacter baumannii NOT DETECTED NOT DETECTED Final   Enterobacteriaceae species NOT DETECTED NOT DETECTED Final   Enterobacter cloacae complex NOT DETECTED NOT DETECTED Final   Escherichia coli NOT DETECTED NOT DETECTED Final   Klebsiella oxytoca NOT DETECTED NOT DETECTED Final   Klebsiella pneumoniae NOT DETECTED NOT DETECTED Final   Proteus species NOT DETECTED NOT DETECTED Final   Serratia marcescens NOT DETECTED NOT DETECTED Final   Haemophilus influenzae NOT DETECTED NOT DETECTED Final   Neisseria meningitidis NOT DETECTED NOT DETECTED Final   Pseudomonas aeruginosa NOT DETECTED NOT DETECTED Final   Candida albicans NOT DETECTED NOT DETECTED Final   Candida glabrata NOT DETECTED NOT DETECTED Final   Candida krusei NOT DETECTED NOT DETECTED Final   Candida parapsilosis NOT  DETECTED NOT  DETECTED Final   Candida tropicalis NOT DETECTED NOT DETECTED Final  MRSA PCR Screening     Status: Abnormal   Collection Time: 08/23/16  5:04 PM  Result Value Ref Range Status   MRSA by PCR POSITIVE (A) NEGATIVE Final    Comment:        The GeneXpert MRSA Assay (FDA approved for NASAL specimens only), is one component of a comprehensive MRSA colonization surveillance program. It is not intended to diagnose MRSA infection nor to guide or monitor treatment for MRSA infections. RESULT CALLED TO, READ BACK BY AND VERIFIED WITH: M.GABOR,RN AT 2347 BY L.PITT 08/23/16   Gastrointestinal Panel by PCR , Stool     Status: None   Collection Time: 08/26/16  6:02 PM  Result Value Ref Range Status   Campylobacter species NOT DETECTED NOT DETECTED Final   Plesimonas shigelloides NOT DETECTED NOT DETECTED Final   Salmonella species NOT DETECTED NOT DETECTED Final   Yersinia enterocolitica NOT DETECTED NOT DETECTED Final   Vibrio species NOT DETECTED NOT DETECTED Final   Vibrio cholerae NOT DETECTED NOT DETECTED Final   Enteroaggregative E coli (EAEC) NOT DETECTED NOT DETECTED Final   Enteropathogenic E coli (EPEC) NOT DETECTED NOT DETECTED Final   Enterotoxigenic E coli (ETEC) NOT DETECTED NOT DETECTED Final   Shiga like toxin producing E coli (STEC) NOT DETECTED NOT DETECTED Final   Shigella/Enteroinvasive E coli (EIEC) NOT DETECTED NOT DETECTED Final   Cryptosporidium NOT DETECTED NOT DETECTED Final   Cyclospora cayetanensis NOT DETECTED NOT DETECTED Final   Entamoeba histolytica NOT DETECTED NOT DETECTED Final   Giardia lamblia NOT DETECTED NOT DETECTED Final   Adenovirus F40/41 NOT DETECTED NOT DETECTED Final   Astrovirus NOT DETECTED NOT DETECTED Final   Norovirus GI/GII NOT DETECTED NOT DETECTED Final   Rotavirus A NOT DETECTED NOT DETECTED Final   Sapovirus (I, II, IV, and V) NOT DETECTED NOT DETECTED Final    Radiology: Dg Knee Right Port  Result Date:  08/26/2016 CLINICAL DATA:  Knee pain without trauma EXAM: PORTABLE RIGHT KNEE - 1-2 VIEW COMPARISON:  None. FINDINGS: Vascular calcifications. Degenerative changes in the patellofemoral compartment. No fracture, dislocation, or joint effusion. IMPRESSION: No acute abnormalities. Electronically Signed   By: Gerome Sam III M.D   On: 08/26/2016 18:03    Medications:   . aspirin EC  81 mg Oral Daily  . doxercalciferol  2 mcg Intravenous Q M,W,F-HD  . feeding supplement (NEPRO CARB STEADY)  237 mL Oral BID BM  . heparin subcutaneous  5,000 Units Subcutaneous Q8H  . heparin  6,000 Units Dialysis Once in dialysis  . insulin aspart  0-15 Units Subcutaneous TID WC  . insulin aspart  0-5 Units Subcutaneous QHS  . insulin aspart  4 Units Subcutaneous TID WC  . insulin glargine  25 Units Subcutaneous BID  . levothyroxine  75 mcg Oral QAC breakfast  . mouth rinse  15 mL Mouth Rinse BID  . metoCLOPramide (REGLAN) injection  10 mg Intravenous Q12H  . midodrine  10 mg Oral Daily  . multivitamin  1 tablet Oral QHS  . mupirocin ointment  1 application Nasal BID  . nystatin   Topical TID  . piperacillin-tazobactam (ZOSYN)  IV  3.375 g Intravenous Q12H  . sevelamer carbonate  2.4 g Oral TID WC  . sodium chloride flush  3 mL Intravenous Q12H   Continuous Infusions: > 4 prob point/1 data point/high risk with new chest pain/elevated troponins = level 3 visit  LOS: 5 days   Dock Baccam  [facility] Pager [phone]. If unable to reach me by pager, please call my cell phone at [phone].  *Please refer to [email or web address], password [email or web address] to get updated schedule on who will round on this patient, as hospitalists switch teams weekly. If 7PM-7AM, please contact night-coverage at [email or web address], password [email or web address] for any overnight needs.  08/28/2016, 8:34 AM

## 2016-08-28 NOTE — Progress Notes (Addendum)
Pharmacy Antibiotic Note  Kimberly [name] is a 60 y.o. female admitted on 08/23/2016 with SOB, LLQ pain, and diarrhea. Pharmacy consulted Vancomycin for bilateral lower extremity cellulitis. Patient also found to have 1/2 positive blood cultures for staph species, methicillin sensitive, however, is a likely contaminant. She is also being treated for possible intra-abdominal infection. Of note, she is a hemodialysis patient with MWF sessions, who missed her a session prior to admission. She has been off schedule, with last HD on 12/16.   Patient received vancomycin load on 12/15 (2g IV x1) and a single vancomycin 1g dose on 12/17. Estimated vancomycin level ~20 mcg/mL and patient is supposed to be resuming MWF HD schedule beginning today (currently in HD). WBC up today to 15.3 and patient afebrile.   Per Dr. Darnelle Catalanama, to continue vancomycin outpatient due to severity of LE cellulitis and will transition from zosyn to Augmentin for completion of therapy for intra-abdominal infection.   Plan: Vancomycin 1g IV qHD MWF  D/C Zosyn  Begin Augmentin 500 mg PO q24hr  Monitor clinical improvement  Follow-up HD schedule  Follow-up length of therapy and blood cultures De-escalate antibiotic when clinically appropriate  Height: 5\' 4"  (162.6 cm) Weight: 254 lb 6.6 oz (115.4 kg) IBW/kg (Calculated) : 54.7  Temp (24hrs), Avg:98.2 F (36.8 C), Min:97.9 F (36.6 C), Max:98.4 F (36.9 C)   Recent Labs Lab 08/24/16 0908 08/25/16 0501 08/26/16 0451 08/27/16 0205 08/28/16 0758 08/28/16 0800  WBC 18.3* 16.8* 15.4* 13.6*  --  15.3*  CREATININE 5.31* 3.76* 5.08* 3.29* 5.08*  --     Estimated Creatinine Clearance: 14.5 mL/min (by C-G formula based on SCr of 5.08 mg/dL (H)).    Allergies  Allergen Reactions  . Ciprofloxacin Itching  . Epinephrine     Increased heart rate    Antimicrobials this admission:  12/13 Ceftriaxone 2g x 1 dose (added to Zosyn) 12/15 Vancomycin 2g x 1 dose 12/17 Vancomycin 1g  x1 dose  -estimated vanc level ~ 20 mcg/mL pre-HD 12/18  12/18 Vanc >> [12/22] 12/13 Zosyn >> 12/18 12/18 Augmentin  >> [12/22]  Dose adjustments this admission:  N/a  Microbiology results:  12/12 BCx: 1/2 staph spp, methicillin sensitive 12/13 MRSA PCR: positive 12/16 GI Panel: negative   Thank you for allowing pharmacy to be a part of this patient's care.  York CeriseKatherine Cook, PharmD Pharmacy Resident  Pager [phone] 08/28/16 9:45 AM

## 2016-08-29 DIAGNOSIS — L03119 Cellulitis of unspecified part of limb: Secondary | ICD-10-CM

## 2016-08-29 DIAGNOSIS — L02419 Cutaneous abscess of limb, unspecified: Secondary | ICD-10-CM

## 2016-08-29 DIAGNOSIS — J9601 Acute respiratory failure with hypoxia: Secondary | ICD-10-CM

## 2016-08-29 DIAGNOSIS — I209 Angina pectoris, unspecified: Secondary | ICD-10-CM

## 2016-08-29 DIAGNOSIS — L8990 Pressure ulcer of unspecified site, unspecified stage: Secondary | ICD-10-CM | POA: Insufficient documentation

## 2016-08-29 DIAGNOSIS — I9589 Other hypotension: Secondary | ICD-10-CM

## 2016-08-29 DIAGNOSIS — I5032 Chronic diastolic (congestive) heart failure: Secondary | ICD-10-CM

## 2016-08-29 LAB — GLUCOSE, CAPILLARY
GLUCOSE-CAPILLARY: 264 mg/dL — AB (ref 65–99)
GLUCOSE-CAPILLARY: 289 mg/dL — AB (ref 65–99)
GLUCOSE-CAPILLARY: 254 mg/dL — AB (ref 65–99)
GLUCOSE-CAPILLARY: 182 mg/dL — AB (ref 65–99)

## 2016-08-29 LAB — TROPONIN I: TROPONIN I: 0.11 ng/mL — AB (ref <0.03)

## 2016-08-29 MED ORDER — INSULIN GLARGINE 100 UNIT/ML ~~LOC~~ SOLN
30.0000 [IU] | Freq: Two times a day (BID) | SUBCUTANEOUS | Status: DC
  Administered 2016-08-30 – 2016-08-31 (×2): 30 [IU] via SUBCUTANEOUS
  Filled 2016-08-29 (×4): qty 0.3

## 2016-08-29 NOTE — Evaluation (Signed)
Clinical/Bedside Swallow Evaluation Patient Details  Name: Galen ManilaSusan K Hann MRN: [id number] Date of Birth: Mar 06, 1956  Today's Date: 08/29/2016 Time: SLP Start Time (ACUTE ONLY): 1055 SLP Stop Time (ACUTE ONLY): 1115 SLP Time Calculation (min) (ACUTE ONLY): 20 min  Past Medical History:  Past Medical History:  Diagnosis Date  . Anemia   . Anxiety   . Arthritis    "knees, feet, hands" (08/23/2016)  . Asthma   . CAD, NATIVE VESSEL    May 10, 2010 cath showed a hyperdynamic LV function, she had dominant circumflex anatomy with a 70-80% small OM1. She had diffuse diabetic plaque particularly in the distal LAD. She nondominant RCA.  Nondominant  . Cellulitis 10/15/2013  . CHF (congestive heart failure) (HCC)    Preserved EF  . Chronic bronchitis (HCC)    "probably once/ yr" (08/23/2016)  . Complication of anesthesia    "I've had difficulty waking up" (08/23/2016)  . COPD   . Depression   . Diabetic neuropathy (HCC)   . Endometrial hyperplasia   . ESRD (end stage renal disease) on dialysis (HCC)    "[facility]; MWF; [facility]" (08/23/2016)  . GERD (gastroesophageal reflux disease)   . History of hiatal hernia   . HYPERLIPIDEMIA   . HYPERTENSION   . Hypothyroidism   . On home oxygen therapy    "2L; 24/7" (08/23/2016)  . Paralyzed vocal cords   . Peripheral neuropathy (HCC)    hx/notes 01/27/2010  . Pneumonia "several times"  . Proliferative retinopathy    hx/notes 01/27/2010  . PVD    CEA  . Restless leg syndrome    "mostly on the right" (08/23/2016)  . Sleep apnea    not on cpap    . 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 Type II diabetes mellitus (HCC)    Past Surgical History:  Past Surgical History:  Procedure Laterality Date  . AV FISTULA PLACEMENT Right 07/08/2015   Procedure: EXPLORATION RIGHT AXILLARY ARTERY AND RIGHT BRACHIAL VEIN;  Surgeon:  Chuck Hinthristopher S Dickson, MD;  [facility];  Service: Vascular;  Laterality: Right;  . CAROTID ENDARTERECTOMY Left X 2  . CATARACT EXTRACTION W/ INTRAOCULAR LENS  IMPLANT, BILATERAL Bilateral   . CERVICAL BIOPSY  ~ 2015   "precancerous cells"  . COLONOSCOPY    . EYE SURGERY Bilateral    numerous surgeries  . INSERTION OF DIALYSIS CATHETER N/A 06/24/2015   Procedure: ULTRASOUND BILATERAL INTERNAL JUGULAR VEIN INSERTION OF DIALYSIS CATHETER LEFT INTERNAL JUGULAR VEIN ;  Surgeon: Pryor OchoaJames D Lawson, MD;  [facility];  Service: Vascular;  Laterality: N/A;  . INTRAUTERINE DEVICE INSERTION  ~ 2015  . VITRECTOMY Bilateral    HPI:  Pt is a 60 year old female admitted 08/23/16 due to respiratory failure from COPD. PMH significant for ESRD with HD, CVA ~6 years ago with history of swallowing issues, now managed independently by pt. BSE ordered for evaluation of swallow safety and function.   Assessment / Plan / Recommendation Clinical Impression  Pt presents with adequate oral motor strength and function, and adequate natural dentition. CN exam is unremarkable. No overt s/s aspiration observed or reported on any consistency tested. Pt reports having had speech therapy for dysphagia following a stroke ~6 years ago. She verbalizes awareness of safe swallow precautions (upright position, small bites/sips, moisten or avoid dry crumbly items, meds with puree), and  indicates adherence to them.  Will continue regular diet and thin liquids. [facility] No further ST intervention recommended at this time. Please reconsult if needs arise.     Aspiration Risk  Mild aspiration risk    Diet Recommendation Thin liquid;Regular   Liquid Administration via: Cup;Straw Medication Administration: Whole meds with puree Supervision: Patient able to self feed Compensations: Minimize environmental distractions;Slow rate;Small sips/bites Postural Changes: Seated upright at 90 degrees     Other  Recommendations Oral Care Recommendations: Oral care BID   Follow up Recommendations   none recommended at this time     Frequency and Duration   n/a         Prognosis Prognosis for Safe Diet Advancement: Good      Swallow Study   General Date of Onset: 08/23/16 HPI: Pt is a 60 year old female admitted 08/23/16 due to respiratory failure from COPD. PMH significant for ESRD with HD, CVA ~6 years ago with history of swallowing issues, now managed independently by pt. BSE ordered for evaluation of swallow safety and function. Type of Study: Bedside Swallow Evaluation Previous Swallow Assessment: none at this facility Diet Prior to this Study: Regular;Thin liquids Temperature Spikes Noted: No Respiratory Status: Nasal cannula History of Recent Intubation: No Behavior/Cognition: Alert;Cooperative;Pleasant mood Oral Cavity Assessment: Within Functional Limits Oral Care Completed by SLP: No Oral Cavity - Dentition: Adequate natural dentition Vision: Functional for self-feeding Self-Feeding Abilities: Able to feed self Patient Positioning: Upright in bed Baseline Vocal Quality: Normal Volitional Cough: Strong Volitional Swallow: Able to elicit    Oral/Motor/Sensory Function Overall Oral Motor/Sensory Function: Within functional limits   Ice Chips Ice chips: Not tested   Thin Liquid Thin Liquid: Within functional limits Presentation: Straw    Nectar Thick Nectar Thick Liquid: Not tested   Honey Thick Honey Thick Liquid: Not tested   Puree Puree: Within functional limits Presentation: Spoon;Self Fed   Solid   GO   Solid: Within functional limits Presentation: Self Fed Other Comments: pt aware of difficulty with dry, crumbly solids, and either avoids or moistens them.       Celia B. RutlandBueche, MSP, CCC-SLP [phone]  Leigh AuroraBueche, Celia Brown 08/29/2016,11:25 AM

## 2016-08-29 NOTE — Care Management Note (Signed)
Case Management Note  Patient Details  Name: KAIREE KOZMA MRN: [id number] Date of Birth: 22-May-1956  Subjective/Objective:       CM following for progression and d/c planning.              Action/Plan: 08/29/2016 Met with pt on behalf of CSW to provide pt with list of local SNF. Pt is familiar with the process as she has been to SNF for short term rehab X2 in the past . Pt states that it have been about two years since she has been in a SNF for rehab. Pt currently reviewing options. CSW will begin process of assisting pt with placement on 08/30/2016.   Expected Discharge Date:                  Expected Discharge Plan:  Skilled Nursing Facility  In-House Referral:  Clinical Social Work  Discharge planning Services  CM Consult  Post Acute Care Choice:  NA Choice offered to:  NA  DME Arranged:  N/A DME Agency:  NA  HH Arranged:  NA HH Agency:  NA  Status of Service:  In process, will continue to follow  If discussed at Long Length of Stay Meetings, dates discussed:    Additional Comments:  Adron Bene, RN 08/29/2016, 4:38 PM

## 2016-08-29 NOTE — Care Management Important Message (Signed)
Important Message  Patient Details  Name: Kimberly ManilaSusan K Mogle MRN: [id number] Date of Birth: January 01, 1956   Medicare Important Message Given:  Yes    Cola Highfill, [name], RN 08/29/2016, 12:24 PM

## 2016-08-29 NOTE — Progress Notes (Signed)
Progress Note    Kimberly [name]  ZOX:[id number] DOB: Apr 08, 1956  DOA: 08/23/2016 PCP: [name], MD    Brief Narrative:   Chief complaint: Follow-up weakness, shortness of breath  Kimberly [name] is an 60 y.o. female with a PMH of end-stage renal disease on HD, COPD, hypertension, CAD and type 2 diabetes who was admitted 08/23/16 with a chief complaint of shortness of breath, left lower quadrant pain and diarrhea. Of note, she missed her last HD treatment secondary to the diarrhea. Upon initial evaluation in the ED, chest x-ray showed mild CHF. CT of the abdomen and pelvis was negative for acute changes.  Assessment/Plan:   Principal problem:  Acute on chronic hypoxic respiratory failure, multifactorial Most  likely secondary to flluid overload after missing HD last Saturday (T/Th/S) . History of CHF and COPD on O2 at home  CXR showed cardiomegaly, CHF and mild pulmonary interstitial edema on admission but no frank pulmonary edema after dialysis. I don't see any evidence of pneumonia. 2D echo 06/2015 EF 55 to 60%, normal LV function, grade 2 diastolic dysfunction.    Leukocytosis / Left lower extremity cellulitis WBC 31 on admission, likely related to underlying infection, GI source versus cellulits. Recent diarrhea with abdominal pain.  GI pathogen panel negative. CXR not indicative of infiltrative process. CT abdomen and pelvis negative for acute findings. One blood culture is growing a Coagulase-negative staph, felt to be a contaminant. Afebrile with improved WBC. Continue Zosyn. Vancomycin added 08/27/16 due to concerns for source of leukocytosis being from cellulitis of lower extremities, rather than a GI etiology.  Active problems:  Chest pain associated with hypotension/elevated troponins  Suspect demand ischemia related to hypotension. Continue aspirin. 12-lead EKG reviewed, showed a left bundle branch block. Cardiologist evaluated patient 08/28/16 but did not feel  further cardiac evaluation required.   Diarrhea Patient had diarrhea prior to admission but none subsequently until today when she had several loose stools after being given prune juice. GI pathogen panel negative.   End-stage renal disease HD per nephrology.   Atrial Fibrillation  CHA2DS2-VASc score 6, not on anticoagulation due to prior history of vaginal bleed. Rate controlled. Continue aspirin.  Anemia of chronic disease  Hemoglobin on admission 10.9 which is consistent with her usual baseline.    Type II Diabetes, uncontrolled, with complications, long-term insulin use Hemoglobin A1c 9.9% indicating suboptimal outpatient control. Currently being managed with 28 units of Lantus twice a day and moderate sliding scale 3 times a day with 4 units of meal coverate. CBGs 224-289. Increase Lantus to 30 units.  Hypothyroidism: Continue home Synthroid.  History of Orthostatic hypotension, persistent hypotension  Continue daily midodrine.   Physical Deconditioning OT/PT evaluations performed, SNF recommended.  MRSA carrier Contact precautions. Decontamination therapy ordered.  Right knee pain s/p fall Images reviewed. No evidence of fracture/dislocation.   Family Communication/Anticipated D/C date and plan/Code Status   DVT prophylaxis: Heparin ordered. Code Status: Full Code.  Family Communication: No family at the bedside. Disposition Plan: Will likely need SNF placement.Okay to discharge when bed available.   Medical Consultants:    Nephrology  Cardiology   Procedures:    None  Anti-Infectives:    Zosyn 08/23/16--->  Rocephin 08/23/16---> 08/24/16  Subjective:   No further complaints of chest pain. No shortness of breath. No nausea or vomiting.   Objective:    Vitals:   08/29/16 0947 08/29/16 1044 08/29/16 1100 08/29/16 1709  BP: (!) 81/25 (!) 96/31 (!) 117/49 Marland Kitchen)  119/35  Pulse: 86   79  Resp: 17   17  Temp: 98.4 F (36.9 C)   98.2 F (36.8  C)  TempSrc: Oral   Oral  SpO2: 96%   100%  Weight:      Height:        Intake/Output Summary (Last 24 hours) at 08/29/16 1734 Last data filed at 08/29/16 1400  Gross per 24 hour  Intake             1020 ml  Output                0 ml  Net             1020 ml   Filed Weights   08/26/16 0709 08/28/16 0704 08/28/16 1104  Weight: 127.6 kg (281 lb 4.9 oz) 115.4 kg (254 lb 6.6 oz) 115 kg (253 lb 8.5 oz)    Exam: General exam: Appears calm and comfortable. Morbidly obese. Respiratory system: Clear to auscultation, diminished in the bases. Respiratory effort normal. Cardiovascular system: S1 & S2 heard, RRR. No JVD,  rubs, gallops or clicks. No murmurs. Gastrointestinal system: Abdomen is nondistended, soft and nontender. No organomegaly or masses felt. Normal bowel sounds heard. Central nervous system: Alert and oriented. No focal neurological deficits. Extremities: As pictured below, Left lower extremity more erythematous than right, likely the source of her infection. Skin: Warm and dry with chronic venous stasis dermatitis. Psychiatry: Judgement and insight appear Mildly impaired. Mood & affect flat.          Data Reviewed:   I have personally reviewed following labs and imaging studies:  Labs: Basic Metabolic Panel:  Recent Labs Lab 08/24/16 0908 08/25/16 0501 08/26/16 0451 08/27/16 0205 08/28/16 0758  NA 132* 132* 131* 129* 131*  K 5.3* 4.3 4.7 4.1 3.9  CL 96* 95* 94* 93* 90*  CO2 19* 26 21* 22 23  GLUCOSE 116* 272* 166* 227* 173*  BUN 40* 24* 39* 17 35*  CREATININE 5.31* 3.76* 5.08* 3.29* 5.08*  CALCIUM 8.6* 8.7* 8.9 8.6* 8.9  PHOS 6.4* 5.9* 7.6* 5.5* 7.7*   GFR Estimated Creatinine Clearance: 14.5 mL/min (by C-G formula based on SCr of 5.08 mg/dL (H)). Liver Function Tests:  Recent Labs Lab 08/23/16 0940  08/24/16 0329 08/24/16 0908 08/25/16 0501 08/26/16 0451 08/27/16 0205 08/28/16 0758  AST 15  --  13*  --   --   --   --   --   ALT 22  --   16  --   --   --   --   --   ALKPHOS 182*  --  156*  --   --   --   --   --   BILITOT 0.8  --  0.6  --   --   --   --   --   PROT 8.8*  --  7.4  --   --   --   --   --   ALBUMIN 3.3*  < > 2.5* 2.6* 2.3* 2.3* 2.1* 2.3*  < > = values in this interval not displayed. CBC:  Recent Labs Lab 08/23/16 0940  08/24/16 0908 08/25/16 0501 08/26/16 0451 08/27/16 0205 08/28/16 0800  WBC 31.0*  < > 18.3* 16.8* 15.4* 13.6* 15.3*  NEUTROABS 29.2*  --   --   --   --   --   --   HGB 10.9*  < > 10.0* 10.0* 9.9* 9.3* 10.2*  HCT 32.9*  < >  30.3* 31.2* 31.5* 30.0* 31.9*  MCV 102.2*  < > 102.4* 105.1* 105.7* 105.3* 103.6*  PLT 212  < > 159 199 195 185 193  < > = values in this interval not displayed. CBG:  Recent Labs Lab 08/28/16 1610 08/28/16 2137 08/29/16 0800 08/29/16 1206 08/29/16 1707  GLUCAP 224* 286* 264* 289* 254*   Cardiac Panel (last 3 results)  Recent Labs  08/28/16 0545 08/28/16 1141 08/29/16 0423  TROPONINI 0.09* 0.13* 0.11*   Hgb A1c: No results for input(s): HGBA1C in the last 72 hours. Microbiology Recent Results (from the past 240 hour(s))  Culture, blood (Routine X 2) w Reflex to ID Panel     Status: None   Collection Time: 08/23/16  3:45 PM  Result Value Ref Range Status   Specimen Description HEMODIALYSIS CATHETER  Final   Special Requests BOTTLES DRAWN AEROBIC AND ANAEROBIC UNK  Final   Culture NO GROWTH 5 DAYS  Final   Report Status 08/28/2016 FINAL  Final  Culture, blood (Routine X 2) w Reflex to ID Panel     Status: Abnormal   Collection Time: 08/23/16  4:00 PM  Result Value Ref Range Status   Specimen Description BLOOD HEMODIALYSIS CATHETER  Final   Special Requests BOTTLES DRAWN AEROBIC AND ANAEROBIC 10 ML  Final   Culture  Setup Time   Final    GRAM POSITIVE COCCI IN CLUSTERS AEROBIC BOTTLE ONLY CRITICAL RESULT CALLED TO, READ BACK BY AND VERIFIED WITHMerlene Morse: V BRYK PHARMD 0535 08/25/16 A BROWNING    Culture (A)  Final    STAPHYLOCOCCUS SPECIES  (COAGULASE NEGATIVE) THE SIGNIFICANCE OF ISOLATING THIS ORGANISM FROM A SINGLE SET OF BLOOD CULTURES WHEN MULTIPLE SETS ARE DRAWN IS UNCERTAIN. PLEASE NOTIFY THE MICROBIOLOGY DEPARTMENT WITHIN ONE WEEK IF SPECIATION AND SENSITIVITIES ARE REQUIRED.    Report Status 08/26/2016 FINAL  Final  Blood Culture ID Panel (Reflexed)     Status: Abnormal   Collection Time: 08/23/16  4:00 PM  Result Value Ref Range Status   Enterococcus species NOT DETECTED NOT DETECTED Final   Listeria monocytogenes NOT DETECTED NOT DETECTED Final   Staphylococcus species DETECTED (A) NOT DETECTED Final    Comment: CRITICAL RESULT CALLED TO, READ BACK BY AND VERIFIED WITHMerlene Morse: V BRYK PHARMD 0535 08/25/16 A BROWNING    Staphylococcus aureus NOT DETECTED NOT DETECTED Final   Methicillin resistance NOT DETECTED NOT DETECTED Final   Streptococcus species NOT DETECTED NOT DETECTED Final   Streptococcus agalactiae NOT DETECTED NOT DETECTED Final   Streptococcus pneumoniae NOT DETECTED NOT DETECTED Final   Streptococcus pyogenes NOT DETECTED NOT DETECTED Final   Acinetobacter baumannii NOT DETECTED NOT DETECTED Final   Enterobacteriaceae species NOT DETECTED NOT DETECTED Final   Enterobacter cloacae complex NOT DETECTED NOT DETECTED Final   Escherichia coli NOT DETECTED NOT DETECTED Final   Klebsiella oxytoca NOT DETECTED NOT DETECTED Final   Klebsiella pneumoniae NOT DETECTED NOT DETECTED Final   Proteus species NOT DETECTED NOT DETECTED Final   Serratia marcescens NOT DETECTED NOT DETECTED Final   Haemophilus influenzae NOT DETECTED NOT DETECTED Final   Neisseria meningitidis NOT DETECTED NOT DETECTED Final   Pseudomonas aeruginosa NOT DETECTED NOT DETECTED Final   Candida albicans NOT DETECTED NOT DETECTED Final   Candida glabrata NOT DETECTED NOT DETECTED Final   Candida krusei NOT DETECTED NOT DETECTED Final   Candida parapsilosis NOT DETECTED NOT DETECTED Final   Candida tropicalis NOT DETECTED NOT DETECTED Final    MRSA PCR Screening  Status: Abnormal   Collection Time: 08/23/16  5:04 PM  Result Value Ref Range Status   MRSA by PCR POSITIVE (A) NEGATIVE Final    Comment:        The GeneXpert MRSA Assay (FDA approved for NASAL specimens only), is one component of a comprehensive MRSA colonization surveillance program. It is not intended to diagnose MRSA infection nor to guide or monitor treatment for MRSA infections. RESULT CALLED TO, READ BACK BY AND VERIFIED WITH: M.GABOR,RN AT 2347 BY L.PITT 08/23/16   Gastrointestinal Panel by PCR , Stool     Status: None   Collection Time: 08/26/16  6:02 PM  Result Value Ref Range Status   Campylobacter species NOT DETECTED NOT DETECTED Final   Plesimonas shigelloides NOT DETECTED NOT DETECTED Final   Salmonella species NOT DETECTED NOT DETECTED Final   Yersinia enterocolitica NOT DETECTED NOT DETECTED Final   Vibrio species NOT DETECTED NOT DETECTED Final   Vibrio cholerae NOT DETECTED NOT DETECTED Final   Enteroaggregative E coli (EAEC) NOT DETECTED NOT DETECTED Final   Enteropathogenic E coli (EPEC) NOT DETECTED NOT DETECTED Final   Enterotoxigenic E coli (ETEC) NOT DETECTED NOT DETECTED Final   Shiga like toxin producing E coli (STEC) NOT DETECTED NOT DETECTED Final   Shigella/Enteroinvasive E coli (EIEC) NOT DETECTED NOT DETECTED Final   Cryptosporidium NOT DETECTED NOT DETECTED Final   Cyclospora cayetanensis NOT DETECTED NOT DETECTED Final   Entamoeba histolytica NOT DETECTED NOT DETECTED Final   Giardia lamblia NOT DETECTED NOT DETECTED Final   Adenovirus F40/41 NOT DETECTED NOT DETECTED Final   Astrovirus NOT DETECTED NOT DETECTED Final   Norovirus GI/GII NOT DETECTED NOT DETECTED Final   Rotavirus A NOT DETECTED NOT DETECTED Final   Sapovirus (I, II, IV, and V) NOT DETECTED NOT DETECTED Final    Radiology: No results found.  Medications:   . amoxicillin-clavulanate  1 tablet Oral QHS  . aspirin EC  81 mg Oral Daily  .  doxercalciferol  2 mcg Intravenous Q M,W,F-HD  . feeding supplement (NEPRO CARB STEADY)  237 mL Oral BID BM  . heparin subcutaneous  5,000 Units Subcutaneous Q8H  . insulin aspart  0-15 Units Subcutaneous TID WC  . insulin aspart  0-5 Units Subcutaneous QHS  . insulin aspart  4 Units Subcutaneous TID WC  . insulin glargine  28 Units Subcutaneous BID  . levothyroxine  75 mcg Oral QAC breakfast  . mouth rinse  15 mL Mouth Rinse BID  . metoCLOPramide (REGLAN) injection  10 mg Intravenous Q12H  . midodrine  10 mg Oral Daily  . multivitamin  1 tablet Oral QHS  . nystatin   Topical TID  . sevelamer carbonate  2.4 g Oral TID WC  . sodium chloride flush  3 mL Intravenous Q12H  . vancomycin  1,000 mg Intravenous Q M,W,F-HD   Continuous Infusions: > 4 prob point/1 data point/Moderate risk = level 2 visit     LOS: 6 days   Parker Wherley  [facility] Pager [phone]. If unable to reach me by pager, please call my cell phone at [phone].  *Please refer to [email or web address], password [email or web address] to get updated schedule on who will round on this patient, as hospitalists switch teams weekly. If 7PM-7AM, please contact night-coverage at [email or web address], password [email or web address] for any overnight needs.  08/29/2016, 5:34 PM

## 2016-08-29 NOTE — Progress Notes (Signed)
[facility] Progress Note   Subjective:  Diarrhea ongoing. No abdominal pain currently. No CP, dyspnea at baseline (with exertion only, on chronic oxygen 2L/min at home). No new symptoms. Working with PT, she is worried she will not be able to stand for transfers.  Objective Vitals:   08/28/16 1237 08/28/16 1800 08/28/16 2110 08/29/16 0354  BP: (!) 102/30 (!) 100/40 (!) 96/32 (!) 105/52  Pulse: 81 85 81 77  Resp: 18 18 16 18   Temp: 98.1 F (36.7 C) 98.5 F (36.9 C) 98.2 F (36.8 C) 97.8 F (36.6 C)  TempSrc: Oral Oral Oral Oral  SpO2: 100% 100% 100% 100%  Weight:      Height:       Physical Exam General: Well appearing female, NAD Heart: RRR; no murmur Lungs:CTAB Extremities: B woody edema with chronic stasis dermatitis; erythema slightly improved Dialysis Access: LIJ TDC, no erythema  Additional Objective Labs: Basic Metabolic Panel:  Recent Labs Lab 08/26/16 0451 08/27/16 0205 08/28/16 0758  NA 131* 129* 131*  K 4.7 4.1 3.9  CL 94* 93* 90*  CO2 21* 22 23  GLUCOSE 166* 227* 173*  BUN 39* 17 35*  CREATININE 5.08* 3.29* 5.08*  CALCIUM 8.9 8.6* 8.9  PHOS 7.6* 5.5* 7.7*   Liver Function Tests:  Recent Labs Lab 08/23/16 0940  08/24/16 0329  08/26/16 0451 08/27/16 0205 08/28/16 0758  AST 15  --  13*  --   --   --   --   ALT 22  --  16  --   --   --   --   ALKPHOS 182*  --  156*  --   --   --   --   BILITOT 0.8  --  0.6  --   --   --   --   PROT 8.8*  --  7.4  --   --   --   --   ALBUMIN 3.3*  < > 2.5*  < > 2.3* 2.1* 2.3*  < > = values in this interval not displayed.  CBC:  Recent Labs Lab 08/23/16 0940  08/24/16 0908 08/25/16 0501 08/26/16 0451 08/27/16 0205 08/28/16 0800  WBC 31.0*  < > 18.3* 16.8* 15.4* 13.6* 15.3*  NEUTROABS 29.2*  --   --   --   --   --   --   HGB 10.9*  < > 10.0* 10.0* 9.9* 9.3* 10.2*  HCT 32.9*  < > 30.3* 31.2* 31.5* 30.0* 31.9*  MCV 102.2*  < > 102.4* 105.1* 105.7* 105.3* 103.6*  PLT 212  < > 159 199  195 185 193  < > = values in this interval not displayed. Blood Culture    Component Value Date/Time   SDES BLOOD HEMODIALYSIS CATHETER 08/23/2016 1600   SPECREQUEST BOTTLES DRAWN AEROBIC AND ANAEROBIC 10 ML 08/23/2016 1600   CULT (A) 08/23/2016 1600    STAPHYLOCOCCUS SPECIES (COAGULASE NEGATIVE) THE SIGNIFICANCE OF ISOLATING THIS ORGANISM FROM A SINGLE SET OF BLOOD CULTURES WHEN MULTIPLE SETS ARE DRAWN IS UNCERTAIN. PLEASE NOTIFY THE MICROBIOLOGY DEPARTMENT WITHIN ONE WEEK IF SPECIATION AND SENSITIVITIES ARE REQUIRED.    REPTSTATUS 08/26/2016 FINAL 08/23/2016 1600    Cardiac Enzymes:  Recent Labs Lab 08/28/16 0011 08/28/16 0545 08/28/16 1141 08/29/16 0423  TROPONINI 0.08* 0.09* 0.13* 0.11*   CBG:  Recent Labs Lab 08/27/16 2051 08/28/16 1237 08/28/16 1610 08/28/16 2137 08/29/16 0800  GLUCAP 251* 125* 224* 286* 264*   Medications:  . amoxicillin-clavulanate  1  tablet Oral QHS  . aspirin EC  81 mg Oral Daily  . doxercalciferol  2 mcg Intravenous Q M,W,F-HD  . feeding supplement (NEPRO CARB STEADY)  237 mL Oral BID BM  . heparin subcutaneous  5,000 Units Subcutaneous Q8H  . insulin aspart  0-15 Units Subcutaneous TID WC  . insulin aspart  0-5 Units Subcutaneous QHS  . insulin aspart  4 Units Subcutaneous TID WC  . insulin glargine  28 Units Subcutaneous BID  . levothyroxine  75 mcg Oral QAC breakfast  . mouth rinse  15 mL Mouth Rinse BID  . metoCLOPramide (REGLAN) injection  10 mg Intravenous Q12H  . midodrine  10 mg Oral Daily  . multivitamin  1 tablet Oral QHS  . nystatin   Topical TID  . sevelamer carbonate  2.4 g Oral TID WC  . sodium chloride flush  3 mL Intravenous Q12H  . vancomycin  1,000 mg Intravenous Q M,W,F-HD    Dialysis Orders: [facility] MWF 4.5 hours 400/auto 1.5  126.5 kg 2.0 K/2.25 Ca UF Profile 2  Linear Na - Heparin 6000 units IV initial bolus/ Heparin 3000 units IV mid run - Hectoral 2 mcg IV q treatment (last PTH 450 07/05/16) - Mircera 50  mcg IV q 4 weeks (last dose 07/26/16 Last HGB 10.7 (08/09/16) Last Fe 96 Tsat 42% 08/01/16.  Assessment/Plan: 1. Diarrhea/ Leukocytosis: Still having diarrhea, GI stool panel negative. WBC improving. Now on Augmentin.  2. Staph Bacteremia: 1 of 2 BCx 12/13 turned positive for Staph. Thought to be contaminant. Rec'd 1 dose of vanc; now d/c'd. 3. ESRD: Continue MWF schedule, next HD 12/20. 4. Chronic hypotension/volume: On midodrine. CHF on initial CXR, resolved on f/u CXR 12/15.  Weights on 12/18 likely error, will try to lower EDW as tolerated. 5. Anemia: Hgb 10.2. No ESA yet. 6. Secondary hyperparathyroidism: Ca ok, high Phos. Uses Fosrenol pwdr at home, on Renvela pwdr here. Issues with swallowing the pwdr, discussed. 7. Nutrition: Alb 2.3, continue Nepro supplements. 8. DM: Per primary. BS ^ 9.  COPD: On chronic oxygen. Per primary. 10. Atrial fibrillation: not on anticoagulation (asa only) d/t prior vaginal bleed. Per primary. 11. Debility: Morbid obesity/weakness-refusing to work with PT. Says she will cooperate with PT. Work on getting her up into chair.  12. Possible cellulits LLE: Per primary. On Augmentin.  Ozzie HoyleKatie Stovall, PA-C 08/29/2016, 9:37 AM  [facility] Pager: [phone] Margretta Zamorano C

## 2016-08-30 LAB — RENAL FUNCTION PANEL
ANION GAP: 13 (ref 5–15)
Albumin: 2.2 g/dL — ABNORMAL LOW (ref 3.5–5.0)
BUN: 29 mg/dL — ABNORMAL HIGH (ref 6–20)
CHLORIDE: 94 mmol/L — AB (ref 101–111)
CO2: 22 mmol/L (ref 22–32)
Calcium: 8.9 mg/dL (ref 8.9–10.3)
Creatinine, Ser: 5.65 mg/dL — ABNORMAL HIGH (ref 0.44–1.00)
GFR, EST AFRICAN AMERICAN: 9 mL/min — AB (ref >60)
GFR, EST NON AFRICAN AMERICAN: 7 mL/min — AB (ref >60)
Glucose, Bld: 230 mg/dL — ABNORMAL HIGH (ref 65–99)
POTASSIUM: 3.8 mmol/L (ref 3.5–5.1)
Phosphorus: 7.2 mg/dL — ABNORMAL HIGH (ref 2.5–4.6)
Sodium: 129 mmol/L — ABNORMAL LOW (ref 135–145)

## 2016-08-30 LAB — CBC
HEMATOCRIT: 30.8 % — AB (ref 36.0–46.0)
HEMOGLOBIN: 9.7 g/dL — AB (ref 12.0–15.0)
MCH: 32.9 pg (ref 26.0–34.0)
MCHC: 31.5 g/dL (ref 30.0–36.0)
MCV: 104.4 fL — AB (ref 78.0–100.0)
PLATELETS: 148 10*3/uL — AB (ref 150–400)
RBC: 2.95 MIL/uL — AB (ref 3.87–5.11)
RDW: 16.3 % — ABNORMAL HIGH (ref 11.5–15.5)
WBC: 15.2 10*3/uL — AB (ref 4.0–10.5)

## 2016-08-30 LAB — GLUCOSE, CAPILLARY
Glucose-Capillary: 270 mg/dL — ABNORMAL HIGH (ref 65–99)
Glucose-Capillary: 210 mg/dL — ABNORMAL HIGH (ref 65–99)

## 2016-08-30 MED ORDER — SODIUM CHLORIDE 0.9 % IV SOLN: 100.0000 mL | INTRAVENOUS | Status: DC | PRN

## 2016-08-30 MED ORDER — ALTEPLASE 2 MG IJ SOLR: 2.0000 mg | Freq: Once | INTRAMUSCULAR | Status: DC | PRN

## 2016-08-30 MED ORDER — HEPARIN SODIUM (PORCINE) 1000 UNIT/ML DIALYSIS
6000.0000 [IU] | INTRAMUSCULAR | Status: DC | PRN
  Filled 2016-08-30: qty 6

## 2016-08-30 MED ORDER — HEPARIN SODIUM (PORCINE) 1000 UNIT/ML DIALYSIS: 1000.0000 [IU] | INTRAMUSCULAR | Status: DC | PRN

## 2016-08-30 MED ORDER — HEPARIN SODIUM (PORCINE) 1000 UNIT/ML DIALYSIS: 20.0000 [IU]/kg | INTRAMUSCULAR | Status: DC | PRN

## 2016-08-30 MED ORDER — VANCOMYCIN HCL IN DEXTROSE 1-5 GM/200ML-% IV SOLN
INTRAVENOUS | Status: AC
  Administered 2016-08-30: 1000 mg via INTRAVENOUS
  Filled 2016-08-30: qty 200

## 2016-08-30 MED ORDER — MIDODRINE HCL 5 MG PO TABS
ORAL_TABLET | ORAL | Status: AC
  Administered 2016-08-30: 10 mg via ORAL
  Filled 2016-08-30: qty 2

## 2016-08-30 NOTE — Clinical Social Work Note (Signed)
Clinical Social Work Assessment  Patient Details  Name: Kimberly ManilaSusan K [name] MRN: [id number] Date of Birth: 1955/12/15  Date of referral:  08/30/16               Reason for consult:  Facility Placement                Permission sought to share information with:  Family Supports Permission granted to share information::  No  Name::     Valetta FullerJoel Ramey  Agency::     Relationship::  Husband  Contact Information:  [phone] (w) and [phone] (mobile)  Housing/Transportation Living arrangements for the past 2 months:  Single Family Home Source of Information:  Patient Patient Interpreter Needed:  None Criminal Activity/Legal Involvement Pertinent to Current Situation/Hospitalization:    Significant Relationships:  Spouse Lives with:  Spouse Do you feel safe going back to the place where you live?  No (Patient in agreement with ST rehab before returning home) Need for family participation in patient care:  No (Coment)  Care giving concerns:  Patient expressed agreement with ST rehab before returning home.   Social Worker assessment / plan: CSW talked with patient briefly at bedside regarding discharge plan and recommendation of ST rehab. PT was getting ready to work with patient but allowed CSW to talk with patient regarding discharge plan. Ms. Ivin BootyCrews in agreement with ST rehab and was provided with facility responses [facility]  Employment status:  Disabled (Comment on whether or not currently receiving Disability) Insurance information:  Armed forces operational officerMedicare, Managed Care (OncologistGeneric Commercial) PT Recommendations:  Skilled Nursing Facility Information / Referral to community resources:  Other (Comment Required) (Patient offered SNF list. Provided with facility responses and provided CSW with her preference.)  Patient/Family's Response to care:  No concerns expressed by patient regarding care during hospitalization.  Patient/Family's Understanding of and Emotional Response to Diagnosis,  Current Treatment, and Prognosis:  Not discussed.  Emotional Assessment Appearance:  Appears stated age Attitude/Demeanor/Rapport:  Other (Appropriate) Affect (typically observed):  Appropriate Orientation:  Oriented to Self, Oriented to Place, Oriented to  Time, Oriented to Situation Alcohol / Substance use:  Tobacco Use, Alcohol Use, Illicit Drugs (Patient reported that she quit smoking and does not drink or use illicit drugs) Psych involvement (Current and /or in the community):  No (Comment)  Discharge Needs  Concerns to be addressed:  Discharge Planning Concerns Readmission within the last 30 days:  No Current discharge risk:  None Barriers to Discharge:  No Barriers Identified   Cristobal GoldmannCrawford, Lailana Shira Bradley, [name] 08/30/2016, 6:26 PM

## 2016-08-30 NOTE — Procedures (Signed)
Tolerating hemodialysis without any hemodynamic difficulties. She feels better. Obese. Volume status Ok Kimberly [name] C

## 2016-08-30 NOTE — Progress Notes (Signed)
PT Cancellation Note  Patient Details Name: Kimberly [name] MRN: [id number] DOB: 11-05-1955   Cancelled Treatment:    Reason Eval/Treat Not Completed: Patient at procedure or test/unavailable;  Will try back to see pt after HD or 12/21 as able. 08/30/2016  [name], PT [phone]  (pager)   Kimberly [name] 08/30/2016, 12:02 PM

## 2016-08-30 NOTE — Progress Notes (Addendum)
Progress Note    Kimberly [name]  ZOX:[id number] DOB: 24-Oct-1955  DOA: 08/23/2016   PCP: Ginette OttoSTONEKING,[name] THOMAS, MD   Brief Narrative:   60 y.o. female with a PMH of end-stage renal disease on HD, COPD, hypertension, CAD and type 2 diabetes who was admitted 08/23/16 with a chief complaint of shortness of breath, left lower quadrant pain and diarrhea. Of note, she missed her last HD treatment secondary to the diarrhea. Upon initial evaluation in the ED, chest x-ray showed mild CHF. CT of the abdomen and pelvis was negative for acute changes.  Assessment/Plan:   Principal problem:  Acute on chronic hypoxic respiratory failure, acute on chronic diastolic CHF, COPD - secondary to flluid overload after missing HD last Saturday (T/Th/S) - pt also with known hx of diastolic CHF and COPD on O2 at home   - 2D echo 06/2015 EF 55 to 60%, normal LV function, grade 2 diastolic dysfunction - currently no acute COPD exacerbation   Sepsis / Left lower extremity cellulitis - review of records indicate that pt did indeed meet criteria for sepsis on admission with HR as high as 110, SBP in 80's, WBC 30K, source appears to be LLE cellulitis  - also considered GI source given pt's diarrhea - GI panel negative, CXR with no clear evidence of PNA - CT abd negative for acute findings  - 1/2 blood culture is growing a Coagulase-negative staph, felt to be a contaminant.  - Tmax 99.3 F in the past 24 hours, WBC continues trending down - pt currently on Augmentin and Vancomycin, please see detailed dates below under ABX section   Active problems:  Chest pain associated with hypotension/elevated troponins  - Suspect demand ischemia related to hypotension, sepsis  - Cardiologist evaluated patient 08/28/16 but did not feel further cardiac evaluation required   Diarrhea - GI pathogen panel negative, overall improving    End-stage renal disease - HD per nephrology.   Atrial Fibrillation  - CHA2DS2-VASc  score 6, not on anticoagulation due to prior history of vaginal bleed - Rate controlled. Continue aspirin.  Hyponatremia - chronic, in the setting of ESRD, volume overload - monitor   Anemia of chronic disease, ESRD, DM - no signs of bleeding, Hg overall stable - CBC in AM   Type II Diabetes, uncontrolled, with complications of nephropathy, long-term insulin use - Hemoglobin A1c 9.9% indicating suboptimal outpatient control.  - Currently being managed with 30 units of Lantus twice a day and moderate sliding scale 3 times a day   Hypothyroidism - Continue home Synthroid  History of Orthostatic hypotension, persistent hypotension  - Continue daily midodrine.   Physical Deconditioning - plan to discharge to SNF in 1-2 days   MRSA carrier - Contact precautions. Decontamination therapy ordered  Right knee pain s/p fall - No evidence of fracture/dislocation  Morbid obesity - Body mass index is 47.57 kg/m.   Family Communication/Anticipated D/C date and plan/Code Status   DVT prophylaxis: Heparin SQ Code Status: Full Code.  Family Communication: No family at the bedside, I have left card with my information for family to call me with questions.  Disposition Plan:SNF in AM  Medical Consultants:    Nephrology  Cardiology  Procedures:    None  Anti-Infectives:    Zosyn 08/23/16---> 08/28/16  Rocephin 08/23/16---> 08/24/16  Augmentin 12/18 --> 12/22  Vancomycin with HD on 08/25/16 -->   Subjective:   No further complaints of chest pain. No shortness of breath. Left foot still  bothering her, pain 3/10 in severity.   Objective:    Vitals:   08/30/16 1130 08/30/16 1200 08/30/16 1230 08/30/16 1300  BP: (!) 99/44 (!) 95/43 (!) 112/47 (!) 114/35  Pulse: 81 79 78 84  Resp: 18 16 14 16   Temp:    97.9 F (36.6 C)  TempSrc:      SpO2: 100% 100%  98%  Weight:      Height:        Intake/Output Summary (Last 24 hours) at 08/30/16 1604 Last data filed  at 08/30/16 1451  Gross per 24 hour  Intake              420 ml  Output                0 ml  Net              420 ml   Filed Weights   08/28/16 1104 08/29/16 2106 08/30/16 0815  Weight: 115 kg (253 lb 8.5 oz) 125.6 kg (276 lb 14.4 oz) 125.7 kg (277 lb 1.9 oz)    Exam: General exam: Appears calm and comfortable. Morbidly obese. Respiratory system: Respiratory effort normal. Cardiovascular system: S1 & S2 heard, RRR. No JVD,  rubs, gallops or clicks. No murmurs. Gastrointestinal system: Abdomen is nondistended, soft and nontender. No organomegaly or masses felt.  Central nervous system: Alert and oriented. No focal neurological deficits. Extremities: As pictured below, Left lower extremity more erythematous than right, likely the source of her infection. Skin: Warm and dry with chronic venous stasis dermatitis.         Data Reviewed:   I have personally reviewed following labs and imaging studies:  Labs: Basic Metabolic Panel:  Recent Labs Lab 08/25/16 0501 08/26/16 0451 08/27/16 0205 08/28/16 0758 08/30/16 0830  NA 132* 131* 129* 131* 129*  K 4.3 4.7 4.1 3.9 3.8  CL 95* 94* 93* 90* 94*  CO2 26 21* 22 23 22   GLUCOSE 272* 166* 227* 173* 230*  BUN 24* 39* 17 35* 29*  CREATININE 3.76* 5.08* 3.29* 5.08* 5.65*  CALCIUM 8.7* 8.9 8.6* 8.9 8.9  PHOS 5.9* 7.6* 5.5* 7.7* 7.2*   Liver Function Tests:  Recent Labs Lab 08/24/16 0329  08/25/16 0501 08/26/16 0451 08/27/16 0205 08/28/16 0758 08/30/16 0830  AST 13*  --   --   --   --   --   --   ALT 16  --   --   --   --   --   --   ALKPHOS 156*  --   --   --   --   --   --   BILITOT 0.6  --   --   --   --   --   --   PROT 7.4  --   --   --   --   --   --   ALBUMIN 2.5*  < > 2.3* 2.3* 2.1* 2.3* 2.2*  < > = values in this interval not displayed. CBC:  Recent Labs Lab 08/25/16 0501 08/26/16 0451 08/27/16 0205 08/28/16 0800 08/30/16 0830  WBC 16.8* 15.4* 13.6* 15.3* 15.2*  HGB 10.0* 9.9* 9.3* 10.2* 9.7*  HCT  31.2* 31.5* 30.0* 31.9* 30.8*  MCV 105.1* 105.7* 105.3* 103.6* 104.4*  PLT 199 195 185 193 148*   CBG:  Recent Labs Lab 08/28/16 2137 08/29/16 0800 08/29/16 1206 08/29/16 1707 08/29/16 2115  GLUCAP 286* 264* 289* 254* 182*   Cardiac Panel (last  3 results)  Recent Labs  08/28/16 0545 08/28/16 1141 08/29/16 0423  TROPONINI 0.09* 0.13* 0.11*   Microbiology Recent Results (from the past 240 hour(s))  Culture, blood (Routine X 2) w Reflex to ID Panel     Status: None   Collection Time: 08/23/16  3:45 PM  Result Value Ref Range Status   Specimen Description HEMODIALYSIS CATHETER  Final   Special Requests BOTTLES DRAWN AEROBIC AND ANAEROBIC UNK  Final   Culture NO GROWTH 5 DAYS  Final   Report Status 08/28/2016 FINAL  Final  Culture, blood (Routine X 2) w Reflex to ID Panel     Status: Abnormal   Collection Time: 08/23/16  4:00 PM  Result Value Ref Range Status   Specimen Description BLOOD HEMODIALYSIS CATHETER  Final   Special Requests BOTTLES DRAWN AEROBIC AND ANAEROBIC 10 ML  Final   Culture  Setup Time   Final    GRAM POSITIVE COCCI IN CLUSTERS AEROBIC BOTTLE ONLY CRITICAL RESULT CALLED TO, READ BACK BY AND VERIFIED WITHMerlene Morse: V BRYK PHARMD 0535 08/25/16 A BROWNING    Culture (A)  Final    STAPHYLOCOCCUS SPECIES (COAGULASE NEGATIVE) THE SIGNIFICANCE OF ISOLATING THIS ORGANISM FROM A SINGLE SET OF BLOOD CULTURES WHEN MULTIPLE SETS ARE DRAWN IS UNCERTAIN. PLEASE NOTIFY THE MICROBIOLOGY DEPARTMENT WITHIN ONE WEEK IF SPECIATION AND SENSITIVITIES ARE REQUIRED.    Report Status 08/26/2016 FINAL  Final  Blood Culture ID Panel (Reflexed)     Status: Abnormal   Collection Time: 08/23/16  4:00 PM  Result Value Ref Range Status   Enterococcus species NOT DETECTED NOT DETECTED Final   Listeria monocytogenes NOT DETECTED NOT DETECTED Final   Staphylococcus species DETECTED (A) NOT DETECTED Final    Comment: CRITICAL RESULT CALLED TO, READ BACK BY AND VERIFIED WITHMerlene Morse: V BRYK PHARMD  0535 08/25/16 A BROWNING    Staphylococcus aureus NOT DETECTED NOT DETECTED Final   Methicillin resistance NOT DETECTED NOT DETECTED Final   Streptococcus species NOT DETECTED NOT DETECTED Final   Streptococcus agalactiae NOT DETECTED NOT DETECTED Final   Streptococcus pneumoniae NOT DETECTED NOT DETECTED Final   Streptococcus pyogenes NOT DETECTED NOT DETECTED Final   Acinetobacter baumannii NOT DETECTED NOT DETECTED Final   Enterobacteriaceae species NOT DETECTED NOT DETECTED Final   Enterobacter cloacae complex NOT DETECTED NOT DETECTED Final   Escherichia coli NOT DETECTED NOT DETECTED Final   Klebsiella oxytoca NOT DETECTED NOT DETECTED Final   Klebsiella pneumoniae NOT DETECTED NOT DETECTED Final   Proteus species NOT DETECTED NOT DETECTED Final   Serratia marcescens NOT DETECTED NOT DETECTED Final   Haemophilus influenzae NOT DETECTED NOT DETECTED Final   Neisseria meningitidis NOT DETECTED NOT DETECTED Final   Pseudomonas aeruginosa NOT DETECTED NOT DETECTED Final   Candida albicans NOT DETECTED NOT DETECTED Final   Candida glabrata NOT DETECTED NOT DETECTED Final   Candida krusei NOT DETECTED NOT DETECTED Final   Candida parapsilosis NOT DETECTED NOT DETECTED Final   Candida tropicalis NOT DETECTED NOT DETECTED Final  MRSA PCR Screening     Status: Abnormal   Collection Time: 08/23/16  5:04 PM  Result Value Ref Range Status   MRSA by PCR POSITIVE (A) NEGATIVE Final  Gastrointestinal Panel by PCR , Stool     Status: None   Collection Time: 08/26/16  6:02 PM  Result Value Ref Range Status   Campylobacter species NOT DETECTED NOT DETECTED Final   Plesimonas shigelloides NOT DETECTED NOT DETECTED Final   Salmonella species NOT  DETECTED NOT DETECTED Final   Yersinia enterocolitica NOT DETECTED NOT DETECTED Final   Vibrio species NOT DETECTED NOT DETECTED Final   Vibrio cholerae NOT DETECTED NOT DETECTED Final   Enteroaggregative E coli (EAEC) NOT DETECTED NOT DETECTED Final     Enteropathogenic E coli (EPEC) NOT DETECTED NOT DETECTED Final   Enterotoxigenic E coli (ETEC) NOT DETECTED NOT DETECTED Final   Shiga like toxin producing E coli (STEC) NOT DETECTED NOT DETECTED Final   Shigella/Enteroinvasive E coli (EIEC) NOT DETECTED NOT DETECTED Final   Cryptosporidium NOT DETECTED NOT DETECTED Final   Cyclospora cayetanensis NOT DETECTED NOT DETECTED Final   Entamoeba histolytica NOT DETECTED NOT DETECTED Final   Giardia lamblia NOT DETECTED NOT DETECTED Final   Adenovirus F40/41 NOT DETECTED NOT DETECTED Final   Astrovirus NOT DETECTED NOT DETECTED Final   Norovirus GI/GII NOT DETECTED NOT DETECTED Final   Rotavirus A NOT DETECTED NOT DETECTED Final   Sapovirus (I, II, IV, and V) NOT DETECTED NOT DETECTED Final    Radiology: No results found.  Medications:   . amoxicillin-clavulanate  1 tablet Oral QHS  . aspirin EC  81 mg Oral Daily  . doxercalciferol  2 mcg Intravenous Q M,W,F-HD  . feeding supplement (NEPRO CARB STEADY)  237 mL Oral BID BM  . heparin subcutaneous  5,000 Units Subcutaneous Q8H  . insulin aspart  0-15 Units Subcutaneous TID WC  . insulin aspart  0-5 Units Subcutaneous QHS  . insulin aspart  4 Units Subcutaneous TID WC  . insulin glargine  30 Units Subcutaneous BID  . levothyroxine  75 mcg Oral QAC breakfast  . mouth rinse  15 mL Mouth Rinse BID  . metoCLOPramide (REGLAN) injection  10 mg Intravenous Q12H  . midodrine  10 mg Oral Daily  . multivitamin  1 tablet Oral QHS  . nystatin   Topical TID  . sevelamer carbonate  2.4 g Oral TID WC  . sodium chloride flush  3 mL Intravenous Q12H  . vancomycin  1,000 mg Intravenous Q M,W,F-HD     LOS: 7 days   MAGICK-Shandrea Lusk  [facility] Pager [phone]. If unable to reach me by pager, please call my cell phone at [phone]  *Please refer to [email or web address], password [email or web address] to get updated schedule on who will round on this patient, as hospitalists switch teams weekly. If 7PM-7AM,  please contact night-coverage at [email or web address], password [email or web address] for any overnight needs.  08/30/2016, 4:04 PM

## 2016-08-30 NOTE — Progress Notes (Signed)
Physical Therapy Treatment Patient Details Name: Kimberly [name] MRN: [id number] DOB: 1955-12-23 Today's Date: 08/30/2016    History of Present Illness 60 y.o.femaleadmittedon 12/13/2017with SOB, LLQ pain, and diarrhea. Principle problem indicated to be acute on chronic hypoxic respiratory failure.  Pt also reports falling at home on 08/19/16. PHM: stroke, DM, morbid obesity, chronic diastolic HF, COPD, hypertension, pulmonary hypertension, chronic edema/cellulitis BLE, CAD.     PT Comments    Pt progressing slowly, but participated well today within 30 minutes of returning from HD.  SNF rehab a good choice.  Follow Up Recommendations  SNF;Supervision for mobility/OOB     Equipment Recommendations  None recommended by PT    Recommendations for Other Services       Precautions / Restrictions Precautions Precautions: Fall Precaution Comments: recent fall at home 08/19/16    Mobility  Bed Mobility Overal bed mobility: Needs Assistance Bed Mobility: Supine to Sit;Sit to Supine     Supine to sit: Min assist Sit to supine: Max assist      Transfers Overall transfer level: Needs assistance   Transfers: Sit to/from Stand Sit to Stand: Min assist;+2 physical assistance         General transfer comment: cues for hand placement and assist to come forward more than up/  Ambulation/Gait Ambulation/Gait assistance: Min assist;+2 physical assistance Ambulation Distance (Feet): 3 Feet (forward and back x2 with standing and w/shift for 25 secs) Assistive device: Rolling walker (2 wheeled) Gait Pattern/deviations: Step-through pattern         Stairs            Wheelchair Mobility    Modified Rankin (Stroke Patients Only)       Balance Overall balance assessment: Needs assistance Sitting-balance support: No upper extremity supported Sitting balance-Leahy Scale: Fair     Standing balance support: Bilateral upper extremity supported Standing balance-Leahy  Scale: Poor Standing balance comment: reliant on AD and external support                    Cognition Arousal/Alertness: Awake/alert Behavior During Therapy: WFL for tasks assessed/performed Overall Cognitive Status: Within Functional Limits for tasks assessed                      Exercises      General Comments        Pertinent Vitals/Pain Pain Assessment: Faces Faces Pain Scale: No hurt    Home Living                      Prior Function            PT Goals (current goals can now be found in the care plan section) Acute Rehab PT Goals Patient Stated Goal: go home PT Goal Formulation: With patient Time For Goal Achievement: 09/08/16 Potential to Achieve Goals: Good Progress towards PT goals: Progressing toward goals    Frequency    Min 3X/week      PT Plan Current plan remains appropriate    Co-evaluation             End of Session Equipment Utilized During Treatment: Gait belt Activity Tolerance: Patient tolerated treatment well;Patient limited by fatigue Patient left: in bed;with call bell/phone within reach     Time: 1353-1420 PT Time Calculation (min) (ACUTE ONLY): 27 min  Charges:  $Gait Training: 8-22 mins $Therapeutic Activity: 8-22 mins  G CodesEliseo [name]:      Kimberly [name] 08/30/2016, 4:04 PM 08/30/2016  [place] BingKen Courney Garrod, PT [phone]  (pager)

## 2016-08-30 NOTE — NC FL2 (Signed)
[place] MEDICAID FL2 LEVEL OF CARE SCREENING TOOL     IDENTIFICATION  Patient Name: Kimberly [name] Birthdate: [birth year] Sex: female Admission Date (Current Location): 08/23/2016  [facility] and [place] Number:  Producer, television/film/video and Address:  [facility], [address], [place], [place] [address]      Provider Number: [id number]  Attending Physician Name and Address:  Dorothea Ogle, MD  Relative Name and Phone Number:  [place], Ng;  [phone] (w), [phone] (mobile)     Current Level of Care: Hospital Recommended Level of Care: Skilled Nursing Facility Prior Approval Number:    Date Approved/Denied:   PASRR Number: [id number]  Discharge Plan: SNF    Current Diagnoses: Patient Active Problem List   Diagnosis Date Noted  . Pressure injury of skin 08/29/2016  . Cellulitis of leg 08/29/2016  . Chest pain 08/28/2016  . Elevated troponin 08/28/2016  . 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 Hyperkalemia 08/23/2016  . Shortness of breath 08/23/2016  . Hyponatremia 08/23/2016  . Hypotension arterial 09/30/2015  . LBBB (left bundle branch block) 09/30/2015  . Complex endometrial hyperplasia with atypia 09/02/2015  . Severe obesity (HCC) 07/29/2015  . Acute respiratory failure with hypoxemia (HCC) 07/20/2015  . Hypotension 07/20/2015  . Uncontrolled type 2 diabetes mellitus (HCC) 07/20/2015  . Vaginal bleeding   . Altered mental status   . Pulmonary arterial hypertension   . Acute on chronic diastolic congestive heart failure (HCC)   . Paroxysmal atrial fibrillation (HCC) 07/11/2015  . ESRD (end stage renal disease) (HCC) 07/08/2015  . Diabetic ulcer of right foot associated with diabetes mellitus due to underlying condition (HCC)   . Pressure ulcer 04/07/2015  . Obesity hypoventilation syndrome (HCC) 08/05/2014  . Morbid obesity (HCC) 06/28/2014  . Physical deconditioning 06/28/2014  . Hypothyroidism 06/10/2014  . Carotid stenosis  12/02/2013  . Varicose veins of lower extremities with other complications 12/02/2013  . Chronic respiratory failure with hypoxia (HCC) 10/13/2013  . On home oxygen therapy 10/13/2013  . Chronic diastolic CHF (congestive heart failure) (HCC) 09/12/2013  . CAD (coronary artery disease), native coronary artery   . DM (diabetes mellitus), type 2 with renal complications (HCC) 05/31/2010  . Hyperlipidemia associated with type 2 diabetes mellitus (HCC) 05/31/2010  . COPD (chronic obstructive pulmonary disease) (HCC) 05/31/2010  . Hypertensive heart disease     Orientation RESPIRATION BLADDER Height & Weight     Self, Time, Situation, Place  O2 (2 Liters Oxygen in hospital) Continent Weight: 277 lb 1.9 oz (125.7 kg) Height:  5\' 4"  (162.6 cm)  BEHAVIORAL SYMPTOMS/MOOD NEUROLOGICAL BOWEL NUTRITION STATUS      Continent Diet (Renal/carb modified)  AMBULATORY STATUS COMMUNICATION OF NEEDS Skin   Extensive Assist (Patient unable to ambulate with PT on 12/17) Verbally Normal                       Personal Care Assistance Level of Assistance  Bathing, Feeding, Dressing Bathing Assistance: Maximum assistance (Upper body-supervision/Lower body-total assistance) Feeding assistance: Independent Dressing Assistance: Maximum assistance (Upper body-min assist/Lower body-total assist)     Functional Limitations Info  Sight, Hearing, Speech Sight Info: Adequate Hearing Info: Adequate Speech Info: Adequate    SPECIAL CARE FACTORS FREQUENCY  PT (By licensed PT), OT (By licensed OT), Speech therapy     PT Frequency: Evaluated 12/15 and a minimum of 3X per week therapy recommended OT Frequency: Evaluated 12/17 and a minimum of 2X per week therapy recommended  Speech Therapy Frequency: Evaluated 12/19      Contractures Contractures Info: Not present    Additional Factors Info  Code Status, Allergies, Insulin Sliding Scale Code Status Info: Full Allergies Info: Ciprofloxacin,  Epinephrine   Insulin Sliding Scale Info: 0-5 Units daily at bedtime and 0-15 Units 3X daily with meals       Current Medications (08/30/2016):  This is the current hospital active medication list Current Facility-Administered Medications  Medication Dose Route Frequency Provider Last Rate Last Dose  . 0.9 %  sodium chloride infusion  100 mL Intravenous PRN Julien NordmannKathryn R Stovall, PA-C      . 0.9 %  sodium chloride infusion  100 mL Intravenous PRN Julien NordmannKathryn R Stovall, PA-C      . acetaminophen (TYLENOL) tablet 650 mg  650 mg Oral Q6H PRN Marcos EkeSara E Wertman, PA-C       Or  . acetaminophen (TYLENOL) suppository 650 mg  650 mg Rectal Q6H PRN Marcos EkeSara E Wertman, PA-C      . alteplase (CATHFLO ACTIVASE) injection 2 mg  2 mg Intracatheter Once PRN Julien NordmannKathryn R Stovall, PA-C      . amoxicillin-clavulanate (AUGMENTIN) 500-125 MG per tablet 500 mg  1 tablet Oral QHS Maryruth Bunhristina P Rama, MD   500 mg at 08/29/16 2259  . aspirin EC tablet 81 mg  81 mg Oral Daily Joaquim NamLisa K Powell, RPH   81 mg at 08/29/16 [id number]  . doxercalciferol (HECTOROL) injection 2 mcg  2 mcg Intravenous Q M,W,F-HD Julien NordmannKathryn R Stovall, PA-C   2 mcg at 08/28/16 0955  . feeding supplement (NEPRO CARB STEADY) liquid 237 mL  237 mL Oral BID BM Julien NordmannKathryn R Stovall, PA-C   237 mL at 08/27/16 1400  . heparin injection 1,000 Units  1,000 Units Dialysis PRN Julien NordmannKathryn R Stovall, PA-C      . heparin injection 2,300 Units  20 Units/kg Dialysis PRN Julien NordmannKathryn R Stovall, PA-C      . heparin injection 5,000 Units  5,000 Units Subcutaneous Q8H Maryruth Bunhristina P Rama, MD   5,000 Units at 08/29/16 2312  . heparin injection 6,000 Units  6,000 Units Dialysis PRN Julien NordmannKathryn R Stovall, PA-C      . HYDROcodone-acetaminophen (NORCO/VICODIN) 5-325 MG per tablet 1-2 tablet  1-2 tablet Oral Q4H PRN Marcos EkeSara E Wertman, PA-C      . insulin aspart (novoLOG) injection 0-15 Units  0-15 Units Subcutaneous TID WC Maryruth Bunhristina P Rama, MD   8 Units at 08/29/16 1820  . insulin aspart (novoLOG) injection 0-5 Units  0-5  Units Subcutaneous QHS Maryruth Bunhristina P Rama, MD   3 Units at 08/28/16 2141  . insulin aspart (novoLOG) injection 4 Units  4 Units Subcutaneous TID WC Maryruth Bunhristina P Rama, MD   4 Units at 08/29/16 1820  . insulin glargine (LANTUS) injection 30 Units  30 Units Subcutaneous BID Christina P Rama, MD      . iopamidol (ISOVUE-300) 61 % injection 100 mL  100 mL Intravenous Once PRN Azalia BilisKevin Campos, MD      . levothyroxine (SYNTHROID, LEVOTHROID) tablet 75 mcg  75 mcg Oral QAC breakfast Marcos EkeSara E Wertman, PA-C   75 mcg at 08/29/16 [id number]  . MEDLINE mouth rinse  15 mL Mouth Rinse BID Ozella Rocksavid J Merrell, MD   15 mL at 08/29/16 2200  . metoCLOPramide (REGLAN) injection 10 mg  10 mg Intravenous Q12H Ozella Rocksavid J Merrell, MD   10 mg at 08/29/16 2259  . midodrine (PROAMATINE) tablet 10 mg  10 mg Oral Daily Christina P  Rama, MD   10 mg at 08/30/16 0843  . multivitamin (RENA-VIT) tablet 1 tablet  1 tablet Oral QHS Joaquim NamLisa K Powell, [facility] CenterRPH   1 tablet at 08/29/16 2259  . nystatin (MYCOSTATIN/NYSTOP) topical powder   Topical TID Maryruth Bunhristina P Rama, MD      . ondansetron [facility](ZOFRAN) tablet 4 mg  4 mg Oral Q6H PRN Marcos EkeSara E Wertman, PA-C       Or  . ondansetron [facility](ZOFRAN) injection 4 mg  4 mg Intravenous Q6H PRN Marcos EkeSara E Wertman, PA-C      . sevelamer carbonate (RENVELA) powder PACK 2.4 g  2.4 g Oral TID WC Julien NordmannKathryn R Stovall, PA-C   2.4 g at 08/28/16 1257  . sodium chloride flush (NS) 0.9 % injection 3 mL  3 mL Intravenous Q12H Marcos EkeSara E Wertman, PA-C   3 mL at 08/29/16 2200  . traZODone (DESYREL) tablet 25 mg  25 mg Oral QHS PRN Marcos EkeSara E Wertman, PA-C      . vancomycin (VANCOCIN) 1-5 GM/200ML-% IVPB           . vancomycin (VANCOCIN) IVPB 1000 mg/200 mL premix  1,000 mg Intravenous Q M,W,F-HD Maryruth Bunhristina P Rama, MD   1,000 mg at 08/28/16 1134     Discharge Medications: Please see discharge summary for a list of discharge medications.  Relevant Imaging Results:  Relevant Lab Results:   Additional Information ss#[id number]. [facility].  Cristobal Goldmannrawford, Frans Valente Bradley, [name]

## 2016-08-31 DIAGNOSIS — E1129 Type 2 diabetes mellitus with other diabetic kidney complication: Secondary | ICD-10-CM | POA: Diagnosis not present

## 2016-08-31 DIAGNOSIS — R197 Diarrhea, unspecified: Secondary | ICD-10-CM | POA: Diagnosis not present

## 2016-08-31 DIAGNOSIS — J9601 Acute respiratory failure with hypoxia: Secondary | ICD-10-CM | POA: Diagnosis not present

## 2016-08-31 DIAGNOSIS — N186 End stage renal disease: Secondary | ICD-10-CM | POA: Diagnosis not present

## 2016-08-31 DIAGNOSIS — E871 Hypo-osmolality and hyponatremia: Secondary | ICD-10-CM | POA: Diagnosis not present

## 2016-08-31 DIAGNOSIS — Z9181 History of falling: Secondary | ICD-10-CM | POA: Diagnosis not present

## 2016-08-31 DIAGNOSIS — Z992 Dependence on renal dialysis: Secondary | ICD-10-CM | POA: Diagnosis not present

## 2016-08-31 DIAGNOSIS — E118 Type 2 diabetes mellitus with unspecified complications: Secondary | ICD-10-CM | POA: Diagnosis not present

## 2016-08-31 DIAGNOSIS — M6281 Muscle weakness (generalized): Secondary | ICD-10-CM | POA: Diagnosis not present

## 2016-08-31 DIAGNOSIS — R2681 Unsteadiness on feet: Secondary | ICD-10-CM | POA: Diagnosis not present

## 2016-08-31 DIAGNOSIS — L03119 Cellulitis of unspecified part of limb: Secondary | ICD-10-CM | POA: Diagnosis not present

## 2016-08-31 DIAGNOSIS — T8242XA Displacement of vascular dialysis catheter, initial encounter: Secondary | ICD-10-CM | POA: Diagnosis not present

## 2016-08-31 DIAGNOSIS — Z794 Long term (current) use of insulin: Secondary | ICD-10-CM | POA: Diagnosis not present

## 2016-08-31 DIAGNOSIS — E875 Hyperkalemia: Secondary | ICD-10-CM | POA: Diagnosis not present

## 2016-08-31 DIAGNOSIS — L03116 Cellulitis of left lower limb: Secondary | ICD-10-CM | POA: Diagnosis not present

## 2016-08-31 DIAGNOSIS — I129 Hypertensive chronic kidney disease with stage 1 through stage 4 chronic kidney disease, or unspecified chronic kidney disease: Secondary | ICD-10-CM | POA: Diagnosis not present

## 2016-08-31 DIAGNOSIS — G4700 Insomnia, unspecified: Secondary | ICD-10-CM | POA: Diagnosis not present

## 2016-08-31 DIAGNOSIS — R531 Weakness: Secondary | ICD-10-CM | POA: Diagnosis not present

## 2016-08-31 DIAGNOSIS — N189 Chronic kidney disease, unspecified: Secondary | ICD-10-CM | POA: Diagnosis not present

## 2016-08-31 DIAGNOSIS — L02419 Cutaneous abscess of limb, unspecified: Secondary | ICD-10-CM | POA: Diagnosis not present

## 2016-08-31 DIAGNOSIS — D72828 Other elevated white blood cell count: Secondary | ICD-10-CM | POA: Diagnosis not present

## 2016-08-31 DIAGNOSIS — I959 Hypotension, unspecified: Secondary | ICD-10-CM | POA: Diagnosis not present

## 2016-08-31 DIAGNOSIS — E1122 Type 2 diabetes mellitus with diabetic chronic kidney disease: Secondary | ICD-10-CM | POA: Diagnosis not present

## 2016-08-31 DIAGNOSIS — M25139 Fistula, unspecified wrist: Secondary | ICD-10-CM | POA: Diagnosis not present

## 2016-08-31 DIAGNOSIS — I953 Hypotension of hemodialysis: Secondary | ICD-10-CM | POA: Diagnosis not present

## 2016-08-31 DIAGNOSIS — D631 Anemia in chronic kidney disease: Secondary | ICD-10-CM | POA: Diagnosis not present

## 2016-08-31 DIAGNOSIS — E039 Hypothyroidism, unspecified: Secondary | ICD-10-CM | POA: Diagnosis not present

## 2016-08-31 DIAGNOSIS — I5033 Acute on chronic diastolic (congestive) heart failure: Secondary | ICD-10-CM | POA: Diagnosis not present

## 2016-08-31 DIAGNOSIS — E8770 Fluid overload, unspecified: Secondary | ICD-10-CM | POA: Diagnosis not present

## 2016-08-31 DIAGNOSIS — E0852 Diabetes mellitus due to underlying condition with diabetic peripheral angiopathy with gangrene: Secondary | ICD-10-CM | POA: Diagnosis not present

## 2016-08-31 DIAGNOSIS — N2581 Secondary hyperparathyroidism of renal origin: Secondary | ICD-10-CM | POA: Diagnosis not present

## 2016-08-31 DIAGNOSIS — I2510 Atherosclerotic heart disease of native coronary artery without angina pectoris: Secondary | ICD-10-CM | POA: Diagnosis not present

## 2016-08-31 DIAGNOSIS — J9611 Chronic respiratory failure with hypoxia: Secondary | ICD-10-CM | POA: Diagnosis not present

## 2016-08-31 LAB — GLUCOSE, CAPILLARY
GLUCOSE-CAPILLARY: 241 mg/dL — AB (ref 65–99)
GLUCOSE-CAPILLARY: 344 mg/dL — AB (ref 65–99)
Glucose-Capillary: 318 mg/dL — ABNORMAL HIGH (ref 65–99)

## 2016-08-31 LAB — BASIC METABOLIC PANEL
Anion gap: 12 (ref 5–15)
BUN: 14 mg/dL (ref 6–20)
CHLORIDE: 96 mmol/L — AB (ref 101–111)
CO2: 23 mmol/L (ref 22–32)
Calcium: 9.1 mg/dL (ref 8.9–10.3)
Creatinine, Ser: 3.91 mg/dL — ABNORMAL HIGH (ref 0.44–1.00)
GFR calc Af Amer: 13 mL/min — ABNORMAL LOW (ref >60)
GFR calc non Af Amer: 11 mL/min — ABNORMAL LOW (ref >60)
GLUCOSE: 246 mg/dL — AB (ref 65–99)
POTASSIUM: 4 mmol/L (ref 3.5–5.1)
SODIUM: 131 mmol/L — AB (ref 135–145)

## 2016-08-31 LAB — CBC
HEMATOCRIT: 33.2 % — AB (ref 36.0–46.0)
HEMOGLOBIN: 10.6 g/dL — AB (ref 12.0–15.0)
MCH: 33.8 pg (ref 26.0–34.0)
MCHC: 31.9 g/dL (ref 30.0–36.0)
MCV: 105.7 fL — AB (ref 78.0–100.0)
Platelets: 159 10*3/uL (ref 150–400)
RBC: 3.14 MIL/uL — AB (ref 3.87–5.11)
RDW: 16.8 % — ABNORMAL HIGH (ref 11.5–15.5)
WBC: 15.1 10*3/uL — ABNORMAL HIGH (ref 4.0–10.5)

## 2016-08-31 MED ORDER — SEVELAMER CARBONATE 2.4 G PO PACK: 2.4000 g | PACK | Freq: Three times a day (TID) | ORAL | 0 refills | Status: AC

## 2016-08-31 MED ORDER — TRAZODONE HCL 50 MG PO TABS: 25.0000 mg | ORAL_TABLET | Freq: Every evening | ORAL | 0 refills | Status: DC | PRN

## 2016-08-31 MED ORDER — INSULIN GLARGINE 100 UNIT/ML ~~LOC~~ SOLN
35.0000 [IU] | Freq: Two times a day (BID) | SUBCUTANEOUS | Status: DC
  Filled 2016-08-31: qty 0.35

## 2016-08-31 MED ORDER — VANCOMYCIN IV (FOR PTA / DISCHARGE USE ONLY): 1000.0000 mg | INTRAVENOUS | 0 refills | Status: AC

## 2016-08-31 MED ORDER — NYSTATIN 100000 UNIT/GM EX POWD: Freq: Three times a day (TID) | CUTANEOUS | 0 refills | Status: DC

## 2016-08-31 MED ORDER — INSULIN GLARGINE 100 UNIT/ML ~~LOC~~ SOLN: 40.0000 [IU] | Freq: Two times a day (BID) | SUBCUTANEOUS | 11 refills | Status: DC

## 2016-08-31 MED ORDER — AMOXICILLIN-POT CLAVULANATE 500-125 MG PO TABS: 1.0000 | ORAL_TABLET | Freq: Every day | ORAL | 0 refills | Status: DC

## 2016-08-31 NOTE — Clinical Social Work Placement (Signed)
   CLINICAL SOCIAL WORK PLACEMENT  NOTE 08/31/16 - DISCHARGED TO [facility] VIA AMBULANCE  Date:  08/31/2016  Patient Details  Name: Kimberly ManilaSusan K Levit MRN: [id number] Date of Birth: [birth year]  Clinical Social Work is seeking post-discharge placement for this patient at the Skilled  Nursing Facility level of care (*CSW will initial, date and re-position this form in  chart as items are completed):  No (Patient expressed facility preference)   Patient/family provided with [facility] Clinical Social Work Department's list of facilities offering this level of care within the geographic area requested by the patient (or if unable, by the patient's family).  Yes   Patient/family informed of their freedom to choose among providers that offer the needed level of care, that participate in Medicare, Medicaid or managed care program needed by the patient, have an available bed and are willing to accept the patient.  Yes   Patient/family informed of [facility]'s ownership interest in [facility] and [facility], as well as of the fact that they are under no obligation to receive care at these facilities.  PASRR submitted to EDS on       PASRR number received on       Existing PASRR number confirmed on 08/30/16     FL2 transmitted to all facilities in geographic area requested by pt/family on 08/30/16     FL2 transmitted to all facilities within larger geographic area on       Patient informed that his/her managed care company has contracts with or will negotiate with certain facilities, including the following:        Yes   Patient/family informed of bed offers received.  Patient [facility] bed at [facility]     Physician recommends and patient chooses bed at      Patient to be transferred to [facility] on 08/31/16.  Patient to be transferred to facility by Ambulance     Patient family notified on 08/31/16 of transfer.  Name of family member notified:  Valetta FullerJoel Spackman, husband      PHYSICIAN       Additional Comment:    _______________________________________________ Cristobal Goldmannrawford, Briannon Boggio Bradley, [name] 08/31/2016, 2:22 PM

## 2016-08-31 NOTE — Discharge Summary (Addendum)
Physician Discharge Summary  Kimberly [name] IRC:[id number] DOB: [birth year] DOA: 08/23/2016  PCP: Kimberly Argyle, MD  Admit date: 08/23/2016 Discharge date: 08/31/2016  Recommendations for Outpatient Follow-up:  1. Pt will need to follow up with PCP in 1 week post discharge 2. Please also check CBC to evaluate Hg and Hct levels, WBC level to make sure trending down  3. Pt to continue Augmentin until last dose on 09/01/2016 4. Pt also to receive last dose of Vancomycin with next HD session 09/01/2016 5. Please reassess left foot to make sure cellulitis resolving   Discharge Diagnoses:  Active Problems:   DM (diabetes mellitus), type 2 with renal complications (HCC)   Hypertensive heart disease   CAD (coronary artery disease), native coronary artery   COPD (chronic obstructive pulmonary disease) (HCC)   Chronic diastolic CHF (congestive heart failure) (HCC)   Chronic respiratory failure with hypoxia (HCC)   On home oxygen therapy   Hypothyroidism   Morbid obesity (HCC)   Physical deconditioning   Obesity hypoventilation syndrome (HCC)   ESRD (end stage renal disease) ([place])  Discharge Condition: Stable  Diet recommendation: Renal diet   Brief Narrative:   60 y.o. female with a PMH of end-stage renal disease on HD, COPD, hypertension, CAD and type 2 diabetes who was admitted 08/23/16 with a chief complaint of shortness of breath, left lower quadrant pain and diarrhea. Of note, she missed her last HD treatment secondary to the diarrhea. Upon initial evaluation in the ED, chest x-ray showed mild CHF. CT of the abdomen and pelvis was negative for acute changes.  Assessment/Plan:   Principal problem:  Acute on chronic hypoxic respiratory failure, acute on chronic diastolic CHF, COPD - secondary to flluid overload after missing HD last Saturday (T/Th/S) - pt also with known hx of diastolic CHF and COPD on O2 at home  - 2D echo 06/2015 EF 55 to 60%, normal LV function, grade 2  diastolic dysfunction - currently no acute COPD exacerbation   Sepsis / Left lower extremity cellulitis - review of records indicate that pt did indeed meet criteria for sepsis on admission with HR as high as 110, SBP in 80's, WBC 30K, source appears to be LLE cellulitis  - also considered GI source given pt's diarrhea - GI panel negative, CXR with no clear evidence of PNA - CT abd negative for acute findings  - 1/2 blood culture is growing a Coagulase-negative staph, felt to be a contaminant.  - WBC continues trending down - pt currently on Augmentin and Vancomycin, please see detailed dates below under ABX section   Active problems:  Chest pain associated with hypotension/elevated troponins  - Suspect demand ischemia related to hypotension, sepsis  - Cardiologist evaluated patient 08/28/16 but did not feel further cardiac evaluation required   Diarrhea - GI pathogen panel negative, overall improving    End-stage renal disease - HD per nephrology. MWF  Atrial Fibrillation  - CHA2DS2-VASc score 6, not on anticoagulation due to prior history of vaginal bleed - Rate controlled. Continue aspirin.  Hyponatremia - chronic, in the setting of ESRD, volume overload  Anemia of chronic disease, ESRD, DM - no signs of bleeding, Hg overall stable  Type II Diabetes, uncontrolled, with complications of nephropathy, long-term insulin use - Hemoglobin A1c 9.9% indicating suboptimal outpatient control.  - Currently being managed with Lantus twice a day, continue upon discharge   Hypothyroidism - Continue home Synthroid  History of Orthostatic hypotension, persistent hypotension  - Continue daily midodrine.  Physical Deconditioning - plan to discharge to SNF   MRSA carrier - Contact precautions. Decontamination therapy ordered  Right knee pain s/p fall - No evidence of fracture/dislocation  Morbid obesity - Body mass index is 47.57 kg/m.   Family  Communication/Anticipated D/C date and plan/Code Status   DVT prophylaxis: Heparin SQ Code Status: Full Code.  Family Communication: No family at the bedside, I have left card with my information for family to call me with questions.  Disposition Plan:SNF  Medical Consultants:    Nephrology  Cardiology  Procedures:    None  Anti-Infectives:    Zosyn 08/23/16---> 08/28/16  Rocephin 08/23/16---> 08/24/16  Augmentin 12/18 --> 12/22  Vancomycin with HD on 08/25/16 --> 12/22  Procedures/Studies: X-ray Chest Pa And Lateral  Result Date: 08/24/2016 CLINICAL DATA:  Cough, shortness of Breath EXAM: CHEST  2 VIEW COMPARISON:  08/23/2016 FINDINGS: Cardiomegaly again noted. Limited study by patient's large body habitus. No gross infiltrate or pulmonary edema. Haziness in left midlung field most likely from overlying breast tissue. Stable double lumen left IJ catheter with tip in right atrium. IMPRESSION: Limited study by patient's large body habitus. No gross infiltrate or pulmonary edema. Haziness in left midlung field most likely from overlying breast tissue. Stable double lumen left IJ catheter with tip in right atrium. Electronically Signed   By: Lahoma Crocker M.D.   On: 08/24/2016 08:08   Ct Abdomen Pelvis W Contrast  Result Date: 08/23/2016 CLINICAL DATA:  The sided abdominal pain EXAM: CT ABDOMEN AND PELVIS WITH CONTRAST TECHNIQUE: Multidetector CT imaging of the abdomen and pelvis was performed using the standard protocol following bolus administration of intravenous contrast. CONTRAST:  131m ISOVUE-300 IOPAMIDOL (ISOVUE-300) INJECTION 61% COMPARISON:  None. FINDINGS: Lower chest: No acute abnormality. Hepatobiliary: The liver is diffusely fatty infiltrated. The gallbladder is within normal limits. Pancreas: Unremarkable. No pancreatic ductal dilatation or surrounding inflammatory changes. Spleen: Normal in size without focal abnormality. Adrenals/Urinary Tract: Adrenal glands  are unremarkable. Kidneys are normal, without renal calculi, focal lesion, or hydronephrosis. Bladder is unremarkable. Stomach/Bowel: Stomach is within normal limits. Appendix appears normal. No evidence of bowel wall thickening, distention, or inflammatory changes. Vascular/Lymphatic: Aortic atherosclerosis. No enlarged abdominal or pelvic lymph nodes. Reproductive: IUD is noted in place. No other focal abnormality is noted. Other: Some laxity of the anterior abdominal wall is noted although no true hernia is seen. No abdominopelvic ascites. Musculoskeletal: No acute or significant osseous findings. IMPRESSION: Chronic changes without acute abnormality. Electronically Signed   By: MInez CatalinaM.D.   On: 08/23/2016 10:56   Dg Chest Port 1 View  Result Date: 08/25/2016 CLINICAL DATA:  Follow-up pulmonary edema EXAM: PORTABLE CHEST 1 VIEW COMPARISON:  08/24/2016 FINDINGS: Cardiac shadow is mildly prominent and stable. Left jugular dialysis catheter is again seen. Lungs are clear bilaterally. No vascular congestion or pulmonary edema is seen. No effusions are noted. IMPRESSION: No active disease. Electronically Signed   By: MInez CatalinaM.D.   On: 08/25/2016 17:13   Dg Chest Portable 1 View  Result Date: 08/23/2016 CLINICAL DATA:  Abdominal pain. EXAM: PORTABLE CHEST 1 VIEW COMPARISON:  07/10/2015 . FINDINGS: Left IJ dual-lumen catheter noted with tip projected over right atrium. Cardiomegaly with bilateral pulmonary interstitial prominence consistent with congestive heart failure. No pleural effusion or pneumothorax . IMPRESSION: 1. Left IJ dual-lumen catheter with tip projected over the right atrium. 2. Congestive heart failure with mild pulmonary interstitial edema. Electronically Signed   By: TMarcello Moores Register   On:  08/23/2016 10:00   Dg Knee Right Port  Result Date: 08/26/2016 CLINICAL DATA:  Knee pain without trauma EXAM: PORTABLE RIGHT KNEE - 1-2 VIEW COMPARISON:  None. FINDINGS: Vascular  calcifications. Degenerative changes in the patellofemoral compartment. No fracture, dislocation, or joint effusion. IMPRESSION: No acute abnormalities. Electronically Signed   By: Dorise Bullion III M.D   On: 08/26/2016 18:03     Discharge Exam: Vitals:   08/31/16 0654 08/31/16 0957  BP: (!) 99/39 (!) 101/36  Pulse: 88 90  Resp: 18 18  Temp: 98.5 F (36.9 C) 98.3 F (36.8 C)   Vitals:   08/30/16 1711 08/30/16 2035 08/31/16 0654 08/31/16 0957  BP: (!) 126/36 (!) 101/34 (!) 99/39 (!) 101/36  Pulse: 81 83 88 90  Resp: 17 18 18 18   Temp: 99.5 F (37.5 C) 99.4 F (37.4 C) 98.5 F (36.9 C) 98.3 F (36.8 C)  TempSrc: Oral Oral Oral Oral  SpO2: 100% 97% 99% 99%  Weight:  124.5 kg (274 lb 8 oz)    Height:        General: Pt is alert, follows commands appropriately, not in acute distress Cardiovascular: Regular rate and rhythm, S1/S2 +, no murmurs, no rubs, no gallops Respiratory: Clear to auscultation bilaterally, no wheezing, no crackles, no rhonchi Abdominal: Soft, non tender, non distended, bowel sounds +, no guarding   Discharge Instructions  Discharge Instructions    Diet - low sodium heart healthy    Complete by:  As directed    Home infusion instructions [facility] May follow [facility] Dosing Protocol; May administer Cathflo as needed to maintain patency of vascular access device.; Flushing of vascular access device: per [facility] Protocol: 0.9% NaCl pre/post medica...    Complete by:  As directed    Instructions:  May follow [facility] Dosing Protocol   Instructions:  May administer Cathflo as needed to maintain patency of vascular access device.   Instructions:  Flushing of vascular access device: per [facility] Protocol: 0.9% NaCl pre/post medication administration and prn patency; Heparin 100 u/ml, 42m for implanted ports and Heparin 10u/ml, 556mfor all other central venous catheters.   Instructions:  May follow [facility] Anaphylaxis Protocol for First Dose Administration  in the home: 0.9% NaCl at 25-50 ml/hr to maintain IV access for protocol meds. Epinephrine 0.3 ml IV/IM PRN and Benadryl 25-50 IV/IM PRN s/s of anaphylaxis.   Instructions:  [facility] Coordinator (RN) to assist per patient IV care needs in the home PRN.   Increase activity slowly    Complete by:  As directed      Allergies as of 08/31/2016      Reactions   Ciprofloxacin Itching   Epinephrine    Increased heart rate      Medication List    TAKE these medications   acetaminophen 325 MG tablet Commonly known as:  TYLENOL Take 650 mg by mouth every 6 (six) hours as needed for mild pain.   amoxicillin-clavulanate 500-125 MG tablet Commonly known as:  AUGMENTIN Take 1 tablet (500 mg total) by mouth at bedtime. Continue taking until last dose on 09/01/2016   aspirin EC 81 MG tablet Take 81 mg by mouth daily.   docusate sodium 100 MG capsule Commonly known as:  COLACE Take 100 mg by mouth daily as needed for mild constipation.   hydrocerin Crea Apply 1 application topically 2 (two) times daily.   insulin aspart 100 UNIT/ML injection Commonly known as:  novoLOG Inject 6 Units  into the skin 3 (three) times daily with meals.   insulin glargine 100 UNIT/ML injection Commonly known as:  LANTUS Inject 0.4 mLs (40 Units total) into the skin 2 (two) times daily. What changed:  how much to take   levonorgestrel 20 MCG/24HR IUD Commonly known as:  MIRENA 1 Intra Uterine Device (1 each total) by Intrauterine route once.   levothyroxine 75 MCG tablet Commonly known as:  SYNTHROID, LEVOTHROID Take 75 mcg by mouth daily before breakfast.   midodrine 10 MG tablet Commonly known as:  PROAMATINE Take 1 tablet (10 mg total) by mouth 2 (two) times daily with a meal. What changed:  when to take this   multivitamin Tabs tablet Take 1 tablet by mouth daily.   nystatin powder Commonly known as:  MYCOSTATIN/NYSTOP Apply topically 3 (three) times daily.   OXYGEN 2lpm  24/7- [facility]   sevelamer carbonate 2.4 g Pack Commonly known as:  RENVELA Take 2.4 g by mouth 3 (three) times daily with meals.   sodium chloride 0.65 % Soln nasal spray Commonly known as:  OCEAN Place 1 spray into both nostrils as needed for congestion.   traZODone 50 MG tablet Commonly known as:  DESYREL Take 0.5 tablets (25 mg total) by mouth at bedtime as needed for sleep.   vancomycin IVPB Inject 1,000 mg into the vein every Monday, Wednesday, and Friday with hemodialysis. Indication:  cellulitis Last Day of Therapy:  09/01/16 Labs - vancomycin level per hemodialysis center Labs - Every other week:  ESR and CRP            Home Infusion Instuctions        Start     Ordered   08/31/16 0000  Home infusion instructions [facility] May follow [facility] Dosing Protocol; May administer Cathflo as needed to maintain patency of vascular access device.; Flushing of vascular access device: per [facility] Protocol: 0.9% NaCl pre/post medica...    Question Answer Comment  Instructions May follow [facility] Dosing Protocol   Instructions May administer Cathflo as needed to maintain patency of vascular access device.   Instructions Flushing of vascular access device: per [facility] Protocol: 0.9% NaCl pre/post medication administration and prn patency; Heparin 100 u/ml, 30m for implanted ports and Heparin 10u/ml, 564mfor all other central venous catheters.   Instructions May follow [facility] Anaphylaxis Protocol for First Dose Administration in the home: 0.9% NaCl at 25-50 ml/hr to maintain IV access for protocol meds. Epinephrine 0.3 ml IV/IM PRN and Benadryl 25-50 IV/IM PRN s/s of anaphylaxis.   Instructions [facility] Infusion Coordinator (RN) to assist per patient IV care needs in the home PRN.      08/31/16 1203     Follow-up Information    STMathews ArgyleMD Follow up.   Specialty:  Internal Medicine Contact information: [address] [place]  [phone]        MAFaye RamsayMD Follow up.   Specialty:  Internal Medicine Why:  call my cell phone with questions [phone] information: [address] [phone]          The results of significant diagnostics from this hospitalization (including imaging, microbiology, ancillary and laboratory) are listed below for reference.     Microbiology: Recent Results (from the past 240 hour(s))  Culture, blood (Routine X 2) w Reflex to ID Panel     Status: None   Collection Time: 08/23/16  3:45 PM  Result Value Ref Range Status   Specimen Description HEMODIALYSIS CATHETER  Final   Special Requests BOTTLES DRAWN AEROBIC AND ANAEROBIC UNK  Final   Culture NO GROWTH 5 DAYS  Final   Report Status 08/28/2016 FINAL  Final  Culture, blood (Routine X 2) w Reflex to ID Panel     Status: Abnormal   Collection Time: 08/23/16  4:00 PM  Result Value Ref Range Status   Specimen Description BLOOD HEMODIALYSIS CATHETER  Final   Special Requests BOTTLES DRAWN AEROBIC AND ANAEROBIC 10 ML  Final   Culture  Setup Time   Final    GRAM POSITIVE COCCI IN CLUSTERS AEROBIC BOTTLE ONLY CRITICAL RESULT CALLED TO, READ BACK BY AND VERIFIED WITHAlona Bene PHARMD 0535 08/25/16 A BROWNING    Culture (A)  Final    STAPHYLOCOCCUS SPECIES (COAGULASE NEGATIVE) THE SIGNIFICANCE OF ISOLATING THIS ORGANISM FROM A SINGLE SET OF BLOOD CULTURES WHEN MULTIPLE SETS ARE DRAWN IS UNCERTAIN. PLEASE NOTIFY THE MICROBIOLOGY DEPARTMENT WITHIN ONE WEEK IF SPECIATION AND SENSITIVITIES ARE REQUIRED.    Report Status 08/26/2016 FINAL  Final  Blood Culture ID Panel (Reflexed)     Status: Abnormal   Collection Time: 08/23/16  4:00 PM  Result Value Ref Range Status   Enterococcus species NOT DETECTED NOT DETECTED Final   Listeria monocytogenes NOT DETECTED NOT DETECTED Final   Staphylococcus species DETECTED (A) NOT DETECTED Final    Comment: CRITICAL  RESULT CALLED TO, READ BACK BY AND VERIFIED WITHAlona Bene PHARMD 0535 08/25/16 A BROWNING    Staphylococcus aureus NOT DETECTED NOT DETECTED Final   Methicillin resistance NOT DETECTED NOT DETECTED Final   Streptococcus species NOT DETECTED NOT DETECTED Final   Streptococcus agalactiae NOT DETECTED NOT DETECTED Final   Streptococcus pneumoniae NOT DETECTED NOT DETECTED Final   Streptococcus pyogenes NOT DETECTED NOT DETECTED Final   Acinetobacter baumannii NOT DETECTED NOT DETECTED Final   Enterobacteriaceae species NOT DETECTED NOT DETECTED Final   Enterobacter cloacae complex NOT DETECTED NOT DETECTED Final   Escherichia coli NOT DETECTED NOT DETECTED Final   Klebsiella oxytoca NOT DETECTED NOT DETECTED Final   Klebsiella pneumoniae NOT DETECTED NOT DETECTED Final   Proteus species NOT DETECTED NOT DETECTED Final   Serratia marcescens NOT DETECTED NOT DETECTED Final   Haemophilus influenzae NOT DETECTED NOT DETECTED Final   Neisseria meningitidis NOT DETECTED NOT DETECTED Final   Pseudomonas aeruginosa NOT DETECTED NOT DETECTED Final   Candida albicans NOT DETECTED NOT DETECTED Final   Candida glabrata NOT DETECTED NOT DETECTED Final   Candida krusei NOT DETECTED NOT DETECTED Final   Candida parapsilosis NOT DETECTED NOT DETECTED Final   Candida tropicalis NOT DETECTED NOT DETECTED Final  MRSA PCR Screening     Status: Abnormal   Collection Time: 08/23/16  5:04 PM  Result Value Ref Range Status   MRSA by PCR POSITIVE (A) NEGATIVE Final    Comment:        The GeneXpert MRSA Assay (FDA approved for NASAL specimens only), is one component of a comprehensive MRSA colonization surveillance program. It is not intended to diagnose MRSA infection nor to guide or monitor treatment for MRSA infections. RESULT CALLED TO, READ BACK BY AND VERIFIED WITH: M.GABOR,RN AT 2347 BY L.PITT 08/23/16   Gastrointestinal Panel by PCR , Stool     Status: None   Collection Time: 08/26/16  6:02 PM   Result Value Ref Range Status   Campylobacter species NOT DETECTED NOT DETECTED  Final   Plesimonas shigelloides NOT DETECTED NOT DETECTED Final   Salmonella species NOT DETECTED NOT DETECTED Final   Yersinia enterocolitica NOT DETECTED NOT DETECTED Final   Vibrio species NOT DETECTED NOT DETECTED Final   Vibrio cholerae NOT DETECTED NOT DETECTED Final   Enteroaggregative E coli (EAEC) NOT DETECTED NOT DETECTED Final   Enteropathogenic E coli (EPEC) NOT DETECTED NOT DETECTED Final   Enterotoxigenic E coli (ETEC) NOT DETECTED NOT DETECTED Final   Shiga like toxin producing E coli (STEC) NOT DETECTED NOT DETECTED Final   Shigella/Enteroinvasive E coli (EIEC) NOT DETECTED NOT DETECTED Final   Cryptosporidium NOT DETECTED NOT DETECTED Final   Cyclospora cayetanensis NOT DETECTED NOT DETECTED Final   Entamoeba histolytica NOT DETECTED NOT DETECTED Final   Giardia lamblia NOT DETECTED NOT DETECTED Final   Adenovirus F40/41 NOT DETECTED NOT DETECTED Final   Astrovirus NOT DETECTED NOT DETECTED Final   Norovirus GI/GII NOT DETECTED NOT DETECTED Final   Rotavirus A NOT DETECTED NOT DETECTED Final   Sapovirus (I, II, IV, and V) NOT DETECTED NOT DETECTED Final     Labs: Basic Metabolic Panel:  Recent Labs Lab 08/25/16 0501 08/26/16 0451 08/27/16 0205 08/28/16 0758 08/30/16 0830 08/31/16 0732  NA 132* 131* 129* 131* 129* 131*  K 4.3 4.7 4.1 3.9 3.8 4.0  CL 95* 94* 93* 90* 94* 96*  CO2 26 21* 22 23 22 23   GLUCOSE 272* 166* 227* 173* 230* 246*  BUN 24* 39* 17 35* 29* 14  CREATININE 3.76* 5.08* 3.29* 5.08* 5.65* 3.91*  CALCIUM 8.7* 8.9 8.6* 8.9 8.9 9.1  PHOS 5.9* 7.6* 5.5* 7.7* 7.2*  --    Liver Function Tests:  Recent Labs Lab 08/25/16 0501 08/26/16 0451 08/27/16 0205 08/28/16 0758 08/30/16 0830  ALBUMIN 2.3* 2.3* 2.1* 2.3* 2.2*   CBC:  Recent Labs Lab 08/26/16 0451 08/27/16 0205 08/28/16 0800 08/30/16 0830 08/31/16 0732  WBC 15.4* 13.6* 15.3* 15.2* 15.1*  HGB 9.9*  9.3* 10.2* 9.7* 10.6*  HCT 31.5* 30.0* 31.9* 30.8* 33.2*  MCV 105.7* 105.3* 103.6* 104.4* 105.7*  PLT 195 185 193 148* 159   Cardiac Enzymes:  Recent Labs Lab 08/28/16 0011 08/28/16 0545 08/28/16 1141 08/29/16 0423  TROPONINI 0.08* 0.09* 0.13* 0.11*   CBG:  Recent Labs Lab 08/29/16 1707 08/29/16 2115 08/30/16 1703 08/30/16 2046 08/31/16 0749  GLUCAP 254* 182* 270* 210* 241*   SIGNED: Time coordinating discharge: 30 minutes  MAGICK-MYERS, ISKRA, MD  [facility] 08/31/2016, 12:04 PM Pager [phone]  If 7PM-7AM, please contact night-coverage [email or web address] Password [email or web address]

## 2016-08-31 NOTE — Progress Notes (Signed)
OT Cancellation Note  Patient Details Name: Galen ManilaSusan K Nasby MRN: [id number] DOB: 06-20-1956   Cancelled Treatment:    Reason Eval/Treat Not Completed: Patient declined, no reason specified. Pt politely declined due to d/c to [facility] today  Galen ManilaSpencer, Rudi Knippenberg Jeanette 08/31/2016, 12:48 PM

## 2016-08-31 NOTE — Progress Notes (Signed)
[facility] Progress Note   Subjective:  Seen in room. Diarrhea ongoing, but not worse. No CP or abdominal pain. Breathing stable (on chronic oxygen 2L/min at home). She tells me that she is being discharged today.    Objective Vitals:   08/30/16 1300 08/30/16 1711 08/30/16 2035 08/31/16 0654  BP: (!) 114/35 (!) 126/36 (!) 101/34 (!) 99/39  Pulse: 84 81 83 88  Resp: 16 17 18 18   Temp: 97.9 F (36.6 C) 99.5 F (37.5 C) 99.4 F (37.4 C) 98.5 F (36.9 C)  TempSrc:  Oral Oral Oral  SpO2: 98% 100% 97% 99%  Weight:   124.5 kg (274 lb 8 oz)   Height:       Physical Exam General: Well appearing female, NAD. On nasal oxygen. Heart: RRR; no murmur. Lungs: CTA anteriorly Extremities: B woody edema with chronic stasis dermatitis; erythema improving. Dialysis Access: L IJ TDC; no erythema.  Additional Objective Labs: Basic Metabolic Panel:  Recent Labs Lab 08/27/16 0205 08/28/16 0758 08/30/16 0830 08/31/16 0732  NA 129* 131* 129* 131*  K 4.1 3.9 3.8 4.0  CL 93* 90* 94* 96*  CO2 22 23 22 23   GLUCOSE 227* 173* 230* 246*  BUN 17 35* 29* 14  CREATININE 3.29* 5.08* 5.65* 3.91*  CALCIUM 8.6* 8.9 8.9 9.1  PHOS 5.5* 7.7* 7.2*  --    Liver Function Tests:  Recent Labs Lab 08/27/16 0205 08/28/16 0758 08/30/16 0830  ALBUMIN 2.1* 2.3* 2.2*   CBC:  Recent Labs Lab 08/26/16 0451 08/27/16 0205 08/28/16 0800 08/30/16 0830 08/31/16 0732  WBC 15.4* 13.6* 15.3* 15.2* 15.1*  HGB 9.9* 9.3* 10.2* 9.7* 10.6*  HCT 31.5* 30.0* 31.9* 30.8* 33.2*  MCV 105.7* 105.3* 103.6* 104.4* 105.7*  PLT 195 185 193 148* 159   Blood Culture    Component Value Date/Time   SDES BLOOD HEMODIALYSIS CATHETER 08/23/2016 1600   SPECREQUEST BOTTLES DRAWN AEROBIC AND ANAEROBIC 10 ML 08/23/2016 1600   CULT (A) 08/23/2016 1600    STAPHYLOCOCCUS SPECIES (COAGULASE NEGATIVE) THE SIGNIFICANCE OF ISOLATING THIS ORGANISM FROM A SINGLE SET OF BLOOD CULTURES WHEN MULTIPLE SETS ARE DRAWN IS  UNCERTAIN. PLEASE NOTIFY THE MICROBIOLOGY DEPARTMENT WITHIN ONE WEEK IF SPECIATION AND SENSITIVITIES ARE REQUIRED.    REPTSTATUS 08/26/2016 FINAL 08/23/2016 1600    Cardiac Enzymes:  Recent Labs Lab 08/28/16 0011 08/28/16 0545 08/28/16 1141 08/29/16 0423  TROPONINI 0.08* 0.09* 0.13* 0.11*   CBG:  Recent Labs Lab 08/29/16 1707 08/29/16 2115 08/30/16 1703 08/30/16 2046 08/31/16 0749  GLUCAP 254* 182* 270* 210* 241*   Medications:  . amoxicillin-clavulanate  1 tablet Oral QHS  . aspirin EC  81 mg Oral Daily  . doxercalciferol  2 mcg Intravenous Q M,W,F-HD  . feeding supplement (NEPRO CARB STEADY)  237 mL Oral BID BM  . heparin subcutaneous  5,000 Units Subcutaneous Q8H  . insulin aspart  0-15 Units Subcutaneous TID WC  . insulin aspart  0-5 Units Subcutaneous QHS  . insulin aspart  4 Units Subcutaneous TID WC  . insulin glargine  30 Units Subcutaneous BID  . levothyroxine  75 mcg Oral QAC breakfast  . mouth rinse  15 mL Mouth Rinse BID  . metoCLOPramide (REGLAN) injection  10 mg Intravenous Q12H  . midodrine  10 mg Oral Daily  . multivitamin  1 tablet Oral QHS  . nystatin   Topical TID  . sevelamer carbonate  2.4 g Oral TID WC  . sodium chloride flush  3 mL Intravenous Q12H  .  vancomycin  1,000 mg Intravenous Q M,W,F-HD    [facility] 400/auto 1.5  126.5 kg 2.0 K/2.25 Ca UF Profile 2  Linear Na - Heparin 6000 units IV initial bolus/ Heparin 3000 units IV mid run - Hectoral 2 mcg IV q treatment (last PTH 450 07/05/16) - Mircera 50 mcg IV q 4 weeks (last dose 07/26/16 Last HGB 10.7 (08/09/16) Last Fe 96 Tsat 42% 08/01/16.  Assessment/Plan: 1. Diarrhea/ Leukocytosis: Still having diarrhea, GI stool panel negative.WBC improving. Now on Augmentin.  2. Staph Bacteremia: 1 of 2 BCx 12/13 turned positive for Staph. Thought to be contaminant. 3. ESRD: Continue MWF schedule, next HD 12/22 (either at home unit or here if still inpatient). 4.  Chronic hypotension/volume: On midodrine. CHF on initial CXR, resolved on f/u CXR 12/15. Weights on 12/18 likely error, lowering EDW as tolerated. 5. Anemia: Hgb 10.6. No ESA yet. 6. Secondary hyperparathyroidism: Ca ok, high Phos. Uses Fosrenol pwdr at home, on Renvela pwdr here. Issues with swallowing the pwdr, discussed. 7. Nutrition: Alb 2.3, continue Nepro supplements. 8. DM: Per primary. BS ^ 9.  COPD: On chronic oxygen. Per primary. 10. Atrial fibrillation: not on anticoagulation (asa only) d/t prior vaginal bleed. Per primary. 11. Debility: Morbid obesity/weakness: Working with PT on getting her up into chair.  12. Possible cellulits LLE: Per primary. On Augmentin and Vancomycin.  Ozzie HoyleKatie Stovall, PA-C 08/31/2016, 9:34 AM  [facility] Pager: [phone]  Renal Attending: [facility]. Worked with PT who thinks transfers ok in HD unit with min assist(per tel conversation)  I agree with note above, next HD in AM. [place] Gayden C

## 2016-08-31 NOTE — Progress Notes (Signed)
[facility] Grills to be D/C'd Skilled nursing facility per MD order.  Discussed prescriptions and follow up appointments with the patient. Prescriptions given to patient, medication list explained in detail. Pt verbalized understanding.  Allergies as of 08/31/2016      Reactions   Ciprofloxacin Itching   Epinephrine    Increased heart rate      Medication List    TAKE these medications   acetaminophen 325 MG tablet Commonly known as:  TYLENOL Take 650 mg by mouth every 6 (six) hours as needed for mild pain.   amoxicillin-clavulanate 500-125 MG tablet Commonly known as:  AUGMENTIN Take 1 tablet (500 mg total) by mouth at bedtime. Continue taking until last dose on 09/01/2016   aspirin EC 81 MG tablet Take 81 mg by mouth daily.   docusate sodium 100 MG capsule Commonly known as:  COLACE Take 100 mg by mouth daily as needed for mild constipation.   hydrocerin Crea Apply 1 application topically 2 (two) times daily.   insulin aspart 100 UNIT/ML injection Commonly known as:  novoLOG Inject 6 Units into the skin 3 (three) times daily with meals.   insulin glargine 100 UNIT/ML injection Commonly known as:  LANTUS Inject 0.4 mLs (40 Units total) into the skin 2 (two) times daily. What changed:  how much to take   levonorgestrel 20 MCG/24HR IUD Commonly known as:  MIRENA 1 Intra Uterine Device (1 each total) by Intrauterine route once.   levothyroxine 75 MCG tablet Commonly known as:  SYNTHROID, LEVOTHROID Take 75 mcg by mouth daily before breakfast.   midodrine 10 MG tablet Commonly known as:  PROAMATINE Take 1 tablet (10 mg total) by mouth 2 (two) times daily with a meal. What changed:  when to take this   multivitamin Tabs tablet Take 1 tablet by mouth daily.   nystatin powder Commonly known as:  MYCOSTATIN/NYSTOP Apply topically 3 (three) times daily.   OXYGEN 2lpm 24/7- [facility]   sevelamer carbonate 2.4 g Pack Commonly known as:  RENVELA Take 2.4 g by mouth 3  (three) times daily with meals.   sodium chloride 0.65 % Soln nasal spray Commonly known as:  OCEAN Place 1 spray into both nostrils as needed for congestion.   traZODone 50 MG tablet Commonly known as:  DESYREL Take 0.5 tablets (25 mg total) by mouth at bedtime as needed for sleep.   vancomycin IVPB Inject 1,000 mg into the vein every Monday, Wednesday, and Friday with hemodialysis. Indication:  cellulitis Last Day of Therapy:  09/01/16 Labs - vancomycin level per hemodialysis center Labs - Every other week:  ESR and CRP            Home Infusion Instuctions        Start     Ordered   08/31/16 0000  Home infusion instructions [facility] May follow [place] Dosing Protocol; May administer Cathflo as needed to maintain patency of vascular access device.; Flushing of vascular access device: per [facility] Protocol: 0.9% NaCl pre/post medica...    Question Answer Comment  Instructions May follow [place] Dosing Protocol   Instructions May administer Cathflo as needed to maintain patency of vascular access device.   Instructions Flushing of vascular access device: per [facility] Protocol: 0.9% NaCl pre/post medication administration and prn patency; Heparin 100 u/ml, 33m for implanted ports and Heparin 10u/ml, 541mfor all other central venous catheters.   Instructions May follow [facility] Anaphylaxis Protocol for First Dose Administration in the home: 0.9% NaCl  at 25-50 ml/hr to maintain IV access for protocol meds. Epinephrine 0.3 ml IV/IM PRN and Benadryl 25-50 IV/IM PRN s/s of anaphylaxis.   Instructions [facility] Infusion Coordinator (RN) to assist per patient IV care needs in the home PRN.      08/31/16 1203      Vitals:   08/31/16 0654 08/31/16 0957  BP: (!) 99/39 (!) 101/36  Pulse: 88 90  Resp: 18 18  Temp: 98.5 F (36.9 C) 98.3 F (36.8 C)    Skin clean, dry and intact without evidence of skin break down, no evidence of skin tears noted. IV catheter discontinued  intact. Site without signs and symptoms of complications. Dressing and pressure applied. Pt denies pain at this time. No complaints noted.  An After Visit Summary was printed and given to the patient. Patient escorted via [place], and D/C to facility via [place], RN [facility] Phone [phone]

## 2016-08-31 NOTE — Discharge Instructions (Signed)

## 2016-08-31 NOTE — Progress Notes (Signed)
Pharmacy Antibiotic Note  Kimberly [name] is a 60 y.o. female admitted on 08/23/2016 with SOB, LLQ pain, and diarrhea. Pharmacy consulted to dose vancomycin for BLE cellulitis, and pt is on Augmentin per MD for possible intra-abdominal infection. Pt has ESRD w/ HD Mon/Wed/Fri, last HD session was 12/20 and pt received vancomycin 1g IV x1. Per Dr. Darnelle Catalanama, pt is to continue both Augmentin and vancomycin through 12/22. OPAT orders have been placed should pt be discharged before last dose of IV vancomycin. WBC remain elevated at 15.1, pt is afebrile, and next HD is scheduled for 12/22.    Plan: -Vancomycin 1g IV qHD MWF  -Augmentin 500 mg PO q24hr  -Follow-up HD schedule closely  Height: 5\' 4"  (162.6 cm) Weight: 274 lb 8 oz (124.5 kg) IBW/kg (Calculated) : 54.7  Temp (24hrs), Avg:98.7 F (37.1 C), Min:97.9 F (36.6 C), Max:99.5 F (37.5 C)   Recent Labs Lab 08/26/16 0451 08/27/16 0205 08/28/16 0758 08/28/16 0800 08/30/16 0830 08/31/16 0732  WBC 15.4* 13.6*  --  15.3* 15.2* 15.1*  CREATININE 5.08* 3.29* 5.08*  --  5.65* 3.91*    Estimated Creatinine Clearance: 19.7 mL/min (by C-G formula based on SCr of 3.91 mg/dL (H)).    Allergies  Allergen Reactions  . Ciprofloxacin Itching  . Epinephrine     Increased heart rate    Antimicrobials this admission:  12/13 Ceftriaxone 2g x1 12/13 Zosyn >> 12/18 12/15 Vancomycin 2g x1; 12/17>>[12/22] **estimated vanc level ~ 20 mcg/mL pre-HD 12/18  12/18 Augmentin >> [12/22]  Dose adjustments this admission:  N/a  Microbiology results:  12/12 BCx: CoNS 12/13 MRSA PCR: positive 12/16 GI Panel: negative   Thank you for allowing pharmacy to be a part of this patient's care.  Fredonia HighlandMichael Bitonti, PharmD PGY-1 Pharmacy Resident Pager: [phone] 08/31/2016

## 2016-09-01 DIAGNOSIS — N186 End stage renal disease: Secondary | ICD-10-CM | POA: Diagnosis not present

## 2016-09-01 DIAGNOSIS — N2581 Secondary hyperparathyroidism of renal origin: Secondary | ICD-10-CM | POA: Diagnosis not present

## 2016-09-03 DIAGNOSIS — N2581 Secondary hyperparathyroidism of renal origin: Secondary | ICD-10-CM | POA: Diagnosis not present

## 2016-09-03 DIAGNOSIS — N186 End stage renal disease: Secondary | ICD-10-CM | POA: Diagnosis not present

## 2016-09-05 ENCOUNTER — Encounter: Payer: Self-pay | Admitting: Internal Medicine

## 2016-09-05 ENCOUNTER — Non-Acute Institutional Stay (SKILLED_NURSING_FACILITY): Payer: Medicare Other | Admitting: Internal Medicine

## 2016-09-05 DIAGNOSIS — N186 End stage renal disease: Secondary | ICD-10-CM

## 2016-09-05 DIAGNOSIS — D72828 Other elevated white blood cell count: Secondary | ICD-10-CM | POA: Diagnosis not present

## 2016-09-05 DIAGNOSIS — E871 Hypo-osmolality and hyponatremia: Secondary | ICD-10-CM | POA: Diagnosis not present

## 2016-09-05 DIAGNOSIS — R531 Weakness: Secondary | ICD-10-CM | POA: Diagnosis not present

## 2016-09-05 DIAGNOSIS — R197 Diarrhea, unspecified: Secondary | ICD-10-CM | POA: Diagnosis not present

## 2016-09-05 DIAGNOSIS — L03115 Cellulitis of right lower limb: Secondary | ICD-10-CM

## 2016-09-05 DIAGNOSIS — I953 Hypotension of hemodialysis: Secondary | ICD-10-CM | POA: Diagnosis not present

## 2016-09-05 DIAGNOSIS — G4700 Insomnia, unspecified: Secondary | ICD-10-CM

## 2016-09-05 DIAGNOSIS — L03116 Cellulitis of left lower limb: Secondary | ICD-10-CM

## 2016-09-05 DIAGNOSIS — Z992 Dependence on renal dialysis: Secondary | ICD-10-CM

## 2016-09-05 DIAGNOSIS — J9611 Chronic respiratory failure with hypoxia: Secondary | ICD-10-CM

## 2016-09-05 DIAGNOSIS — I2510 Atherosclerotic heart disease of native coronary artery without angina pectoris: Secondary | ICD-10-CM | POA: Diagnosis not present

## 2016-09-05 DIAGNOSIS — E039 Hypothyroidism, unspecified: Secondary | ICD-10-CM

## 2016-09-05 DIAGNOSIS — E1122 Type 2 diabetes mellitus with diabetic chronic kidney disease: Secondary | ICD-10-CM

## 2016-09-05 NOTE — Progress Notes (Signed)
[facility]  PCP: Ginette OttoSTONEKING,[name] THOMAS, MD   Code Status: DNR  Goals of care: Advanced Directive information Advanced Directives 09/05/2016  Does Patient Have a Medical Advance Directive? Yes  Type of Advance Directive Out of facility DNR (pink MOST or yellow form)  Does patient want to make changes to medical advance directive? No - Patient declined  Would patient like information on creating a medical advance directive? -  Pre-existing out of facility DNR order (yellow form or pink MOST form) -       Extended Emergency Contact Information Primary Emergency Contact: Medinger,Joel Address: [address]          [place], [place] Work Phone: [phone] Mobile Phone: [phone] Relation: Spouse Secondary Emergency Contact: Alwyn PeaBolling,David  [place] Mobile Phone: [phone] Relation: Brother   Allergies  Allergen Reactions  . Ciprofloxacin Itching  . Epinephrine     Increased heart rate    Chief Complaint  Patient presents with  . New Admit To SNF    New Admission Visit      HPI:  Patient is a 60 y.o. female seen today for short term rehabilitation post hospital admission from  08/23/2016-08/31/2016 with acute on chronic hypoxic respiratory failure from COPD exacerbation. She was also noted to have left lower Extremity cellulitis. She was placed on antibiotics and completed the course on 09/01/2016. She had diarrhea on admission to hospital and continues to have diarrhea. Infectious workup was negative. She has medical history of type 2 diabetes, end-stage  Renal disease on dialysis, hypertensive heart disease , COPD, chronic diastolic congestive heart failure.  Review of Systems:  Constitutional: Negative for fever, chills, diaphoresis.  HENT: Negative for headache, congestion, nasal discharge Eyes: Negative for blurred vision, double vision and discharge. Wears glasses.   Respiratory: Negative for  shortness of breath and wheezing. Positive for dry cough.    Cardiovascular: Negative for chest pain, palpitations, leg swelling.  Gastrointestinal: Negative for heartburn,vomiting,loss of appetite. Positive for nausea and diarrhea.  She has history of external hemorrhoids. Genitourinary: she is anuric.  Musculoskeletal: Negative for back pain, fall in the facility.  Skin: Negative for itching, rash.  Neurological: Negative for dizziness. Neuropathy to both legs Psychiatric/Behavioral: Negative for depression   Past Medical History:  Diagnosis Date  . Anemia   . Anxiety   . Arthritis    "knees, feet, hands" (08/23/2016)  . Asthma   . CAD, NATIVE VESSEL    May 10, 2010 cath showed a hyperdynamic LV function, she had dominant circumflex anatomy with a 70-80% small OM1. She had diffuse diabetic plaque particularly in the distal LAD. She nondominant RCA.  Nondominant  . Cellulitis 10/15/2013  . CHF (congestive heart failure) (HCC)    Preserved EF  . Chronic bronchitis (HCC)    "probably once/ yr" (08/23/2016)  . Complication of anesthesia    "I've had difficulty waking up" (08/23/2016)  . COPD   . Depression   . Diabetic neuropathy (HCC)   . Endometrial hyperplasia   . ESRD (end stage renal disease) on dialysis (HCC)    "[facility]; MWF; [facility]" (08/23/2016)  . GERD (gastroesophageal reflux disease)   . History of hiatal hernia   . HYPERLIPIDEMIA   . HYPERTENSION   . Hypothyroidism   . On home oxygen therapy    "2L; 24/7" (08/23/2016)  . Paralyzed vocal cords   . Peripheral neuropathy (HCC)    hx/notes 01/27/2010  . Pneumonia "several times"  .  Proliferative retinopathy    hx/notes 01/27/2010  . PVD    CEA  . Restless leg syndrome    "mostly on the right" (08/23/2016)  . Sleep apnea    not on cpap    . 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 Type II  diabetes mellitus (HCC)    Past Surgical History:  Procedure Laterality Date  . AV FISTULA PLACEMENT Right 07/08/2015   Procedure: EXPLORATION RIGHT AXILLARY ARTERY AND RIGHT BRACHIAL VEIN;  Surgeon: Chuck Hint, MD;  Location: [facility] OR;  Service: Vascular;  Laterality: Right;  . CAROTID ENDARTERECTOMY Left X 2  . CATARACT EXTRACTION W/ INTRAOCULAR LENS  IMPLANT, BILATERAL Bilateral   . CERVICAL BIOPSY  ~ 2015   "precancerous cells"  . COLONOSCOPY    . EYE SURGERY Bilateral    numerous surgeries  . INSERTION OF DIALYSIS CATHETER N/A 06/24/2015   Procedure: ULTRASOUND BILATERAL INTERNAL JUGULAR VEIN INSERTION OF DIALYSIS CATHETER LEFT INTERNAL JUGULAR VEIN ;  Surgeon: Pryor Ochoa, MD;  Location: [facility] OR;  Service: Vascular;  Laterality: N/A;  . INTRAUTERINE DEVICE INSERTION  ~ 2015  . VITRECTOMY Bilateral    Social History:   reports that she quit smoking about 10 years ago. Her smoking use included Cigarettes. She has a 20.00 pack-year smoking history. She has never used smokeless tobacco. She reports that she does not drink alcohol or use drugs.  Family History  Problem Relation Age of Onset  . Cancer Mother     Breast, NHL  . Stroke Mother   . Peripheral vascular disease Father   . CAD Father 51  . Heart attack Father   . Hypertension Father   . Asthma Father   . Heart disease Father     before age 24    Medications: Allergies as of 09/05/2016      Reactions   Ciprofloxacin Itching   Epinephrine    Increased heart rate      Medication List       Accurate as of 09/05/16  4:01 PM. Always use your most recent med list.          acetaminophen 325 MG tablet Commonly known as:  TYLENOL Take 650 mg by mouth every 6 (six) hours as needed for mild pain.   aspirin EC 81 MG tablet Take 81 mg by mouth daily.   docusate sodium 100 MG capsule Commonly known as:  COLACE Take 100 mg by mouth daily as needed for mild constipation.   guaiFENesin-dextromethorphan  100-10 MG/5ML syrup Commonly known as:  ROBITUSSIN DM Take 10 mLs by mouth every 6 (six) hours as needed for cough.   hydrocerin Crea Apply 1 application topically 2 (two) times daily.   insulin aspart 100 UNIT/ML injection Commonly known as:  novoLOG Inject 6 Units into the skin 3 (three) times daily with meals.   insulin glargine 100 UNIT/ML injection Commonly known as:  LANTUS Inject 0.4 mLs (40 Units total) into the skin 2 (two) times daily.   levonorgestrel 20 MCG/24HR IUD Commonly known as:  MIRENA 1 Intra Uterine Device (1 each total) by Intrauterine route once.   levothyroxine 75 MCG tablet Commonly known as:  SYNTHROID, LEVOTHROID Take 75 mcg by mouth daily before breakfast.   midodrine 10 MG tablet Commonly known as:  PROAMATINE Take 1 tablet (10 mg total) by mouth 2 (two)  times daily with a meal.   multivitamin Tabs tablet Take 1 tablet by mouth daily.   nystatin powder Commonly known as:  MYCOSTATIN/NYSTOP Apply topically 3 (three) times daily.   OXYGEN 2lpm 24/7- [facility]   sevelamer carbonate 2.4 g Pack Commonly known as:  RENVELA Take 2.4 g by mouth 3 (three) times daily with meals.   sodium chloride 0.65 % Soln nasal spray Commonly known as:  OCEAN Place 1 spray into both nostrils as needed for congestion.   traZODone 50 MG tablet Commonly known as:  DESYREL Take 0.5 tablets (25 mg total) by mouth at bedtime as needed for sleep.       Immunizations: Immunization History  Administered Date(s) Administered  . Influenza Split 06/12/2015  . Influenza-Unspecified 05/12/2014  . Pneumococcal Conjugate-13 07/12/2014     Physical Exam: Vitals:   09/05/16 1518  BP: (!) 116/44  Pulse: 89  Resp: 16  Temp: 98.8 F (37.1 C)  TempSrc: Oral  SpO2: 100%  Weight: 274 lb 8 oz (124.5 kg)  Height: 5\' 4"  (1.626 m)   Body mass index is 47.12 kg/m.  General- elderly female, Morbidly oese, in no acute distress Head- normocephalic, atraumatic Nose- no  maxillary or frontal sinus tenderness, no nasal discharge Throat- moist mucus membrane Eyes- PERRLA, EOMI, no pallor, no icterus Neck- no cervical lymphadenopathy Cardiovascular- normal s1,s2, no murmur Respiratory- bilateral clear to auscultation, + wheeze, no rhonchi, no crackles, no use of accessory muscles , on 2L oxygen  by nasal cannula Abdomen- bowel sounds present, soft, non tender, distended Musculoskeletal- able to move all 4 extremities,  Generalized weakness, dialysis port on left chest wall  Neurological- alert and oriented to person, place and time Skin- warm and dry, chronic skin changes to her lower legs, trace leg edema,  Psychiatry- normal mood and affect    Labs reviewed: Basic Metabolic Panel:  Recent Labs  16/10/96 0205 08/28/16 0758 08/30/16 0830 08/31/16 0732  NA 129* 131* 129* 131*  K 4.1 3.9 3.8 4.0  CL 93* 90* 94* 96*  CO2 22 23 22 23   GLUCOSE 227* 173* 230* 246*  BUN 17 35* 29* 14  CREATININE 3.29* 5.08* 5.65* 3.91*  CALCIUM 8.6* 8.9 8.9 9.1  PHOS 5.5* 7.7* 7.2*  --    Liver Function Tests:  Recent Labs  08/23/16 0940  08/24/16 0329  08/27/16 0205 08/28/16 0758 08/30/16 0830  AST 15  --  13*  --   --   --   --   ALT 22  --  16  --   --   --   --   ALKPHOS 182*  --  156*  --   --   --   --   BILITOT 0.8  --  0.6  --   --   --   --   PROT 8.8*  --  7.4  --   --   --   --   ALBUMIN 3.3*  < > 2.5*  < > 2.1* 2.3* 2.2*  < > = values in this interval not displayed. No results for input(s): LIPASE, AMYLASE in the last 8760 hours. No results for input(s): AMMONIA in the last 8760 hours. CBC:  Recent Labs  08/23/16 0940  08/28/16 0800 08/30/16 0830 08/31/16 0732  WBC 31.0*  < > 15.3* 15.2* 15.1*  NEUTROABS 29.2*  --   --   --   --   HGB 10.9*  < > 10.2* 9.7* 10.6*  HCT 32.9*  < >  31.9* 30.8* 33.2*  MCV 102.2*  < > 103.6* 104.4* 105.7*  PLT 212  < > 193 148* 159  < > = values in this interval not displayed. Cardiac Enzymes:  Recent  Labs  08/28/16 0545 08/28/16 1141 08/29/16 0423  TROPONINI 0.09* 0.13* 0.11*   BNP: Invalid input(s): POCBNP CBG:  Recent Labs  08/31/16 0749 08/31/16 1157 08/31/16 1707  GLUCAP 241* 318* 344*    Radiological Exams: X-ray Chest Pa And Lateral  Result Date: 08/24/2016 CLINICAL DATA:  Cough, shortness of Breath EXAM: CHEST  2 VIEW COMPARISON:  08/23/2016 FINDINGS: Cardiomegaly again noted. Limited study by patient's large body habitus. No gross infiltrate or pulmonary edema. Haziness in left midlung field most likely from overlying breast tissue. Stable double lumen left IJ catheter with tip in right atrium. IMPRESSION: Limited study by patient's large body habitus. No gross infiltrate or pulmonary edema. Haziness in left midlung field most likely from overlying breast tissue. Stable double lumen left IJ catheter with tip in right atrium. Electronically Signed   By: Natasha Mead M.D.   On: 08/24/2016 08:08   Ct Abdomen Pelvis W Contrast  Result Date: 08/23/2016 CLINICAL DATA:  The sided abdominal pain EXAM: CT ABDOMEN AND PELVIS WITH CONTRAST TECHNIQUE: Multidetector CT imaging of the abdomen and pelvis was performed using the standard protocol following bolus administration of intravenous contrast. CONTRAST:  ISOVUE-300 IOPAMIDOL (ISOVUE-300) INJECTION 61% COMPARISON:  None. FINDINGS: Lower chest: No acute abnormality. Hepatobiliary: The liver is diffusely fatty infiltrated. The gallbladder is within normal limits. Pancreas: Unremarkable. No pancreatic ductal dilatation or surrounding inflammatory changes. Spleen: Normal in size without focal abnormality. Adrenals/Urinary Tract: Adrenal glands are unremarkable. Kidneys are normal, without renal calculi, focal lesion, or hydronephrosis. Bladder is unremarkable. Stomach/Bowel: Stomach is within normal limits. Appendix appears normal. No evidence of bowel wall thickening, distention, or inflammatory changes. Vascular/Lymphatic: Aortic  atherosclerosis. No enlarged abdominal or pelvic lymph nodes. Reproductive: IUD is noted in place. No other focal abnormality is noted. Other: Some laxity of the anterior abdominal wall is noted although no true hernia is seen. No abdominopelvic ascites. Musculoskeletal: No acute or significant osseous findings. IMPRESSION: Chronic changes without acute abnormality. Electronically Signed   By: Alcide Clever M.D.   On: 08/23/2016 10:56   Dg Chest Port 1 View  Result Date: 08/25/2016 CLINICAL DATA:  Follow-up pulmonary edema EXAM: PORTABLE CHEST 1 VIEW COMPARISON:  08/24/2016 FINDINGS: Cardiac shadow is mildly prominent and stable. Left jugular dialysis catheter is again seen. Lungs are clear bilaterally. No vascular congestion or pulmonary edema is seen. No effusions are noted. IMPRESSION: No active disease. Electronically Signed   By: Alcide Clever M.D.   On: 08/25/2016 17:13   Dg Chest Portable 1 View  Result Date: 08/23/2016 CLINICAL DATA:  Abdominal pain. EXAM: PORTABLE CHEST 1 VIEW COMPARISON:  07/10/2015 . FINDINGS: Left IJ dual-lumen catheter noted with tip projected over right atrium. Cardiomegaly with bilateral pulmonary interstitial prominence consistent with congestive heart failure. No pleural effusion or pneumothorax . IMPRESSION: 1. Left IJ dual-lumen catheter with tip projected over the right atrium. 2. Congestive heart failure with mild pulmonary interstitial edema. Electronically Signed   By: Maisie Fus  Register   On: 08/23/2016 10:00   Dg Knee Right Port  Result Date: 08/26/2016 CLINICAL DATA:  Knee pain without trauma EXAM: PORTABLE RIGHT KNEE - 1-2 VIEW COMPARISON:  None. FINDINGS: Vascular calcifications. Degenerative changes in the patellofemoral compartment. No fracture, dislocation, or joint effusion. IMPRESSION: No acute abnormalities. Electronically Signed  By: Gerome Samavid  Williams III M.D   On: 08/26/2016 18:03    Assessment/Plan   generalized weakness  From deconditioning. Will  need for her to work with physical and occupational therapy team to help regain her strength and balance.   cellulitis  Has completed her course of antibiotic. Has made clinical improvement.Monitor WBC and temperature Curve.  diarrhea infectious workup has been negative  In the hospital. Recently completed her course of antibiotic. Start her on florastor twice a day.   End-stage renal disease Continue hemodialysis 3 days a week. Continue sevelamer  Anemia of chronic disease Monitor cbc  Leukocytosis With her infections, check cbc   hyponatremia  monitor BMP , especially with her diarrhea   hypotension related to dialysis continue midodrine 10 mg twice a day with meals. Monitor her blood pressure readings.  Chronic repiratory failure Breathing has been stable. Was treated for COPD exacerbation in the hospital. Continuity oxygen 2 L by nasal cannula.  CAD Chest pain free. Continue aspirin  diabetes mellitus type 2  In obese  continue Lantus 40 units twice a  Day and NovoLog 6 unit 3 times a day with meals. Monitor her blood sugar reading.  hypothyroidism Continue levothyroxine 75  Micrograms Daily.   insomnia continue  Trazodone 25 mg daily at bedtime as needed for sleep.     Goals of care: short term rehabilitation   Labs/tests ordered: cbc, bmp 09/06/16  Family/ staff Communication: reviewed care plan with patient and nursing supervisor    Oneal GroutMAHIMA Valicia Rief, MD Internal Medicine [facility] [address] [place], [place] [address] Cell Phone (Monday-Friday 8 am - 5 pm): [phone] On Call: [phone] and follow prompts after 5 pm and on weekends Office Phone: [phone] Office Fax: [phone]

## 2016-09-06 DIAGNOSIS — N186 End stage renal disease: Secondary | ICD-10-CM | POA: Diagnosis not present

## 2016-09-06 DIAGNOSIS — N2581 Secondary hyperparathyroidism of renal origin: Secondary | ICD-10-CM | POA: Diagnosis not present

## 2016-09-06 LAB — CBC AND DIFFERENTIAL
HCT: 34 % — AB (ref 36–46)
Hemoglobin: 10.5 g/dL — AB (ref 12.0–16.0)
NEUTROS ABS: 11 /uL
Platelets: 189 10*3/uL (ref 150–399)
WBC: 12.9 10^3/mL

## 2016-09-06 LAB — BASIC METABOLIC PANEL
BUN: 22 mg/dL — AB (ref 4–21)
CREATININE: 6.1 mg/dL — AB (ref 0.5–1.1)
GLUCOSE: 267 mg/dL
POTASSIUM: 3.9 mmol/L (ref 3.4–5.3)
SODIUM: 133 mmol/L — AB (ref 137–147)

## 2016-09-08 DIAGNOSIS — Z992 Dependence on renal dialysis: Secondary | ICD-10-CM | POA: Diagnosis not present

## 2016-09-08 DIAGNOSIS — N186 End stage renal disease: Secondary | ICD-10-CM | POA: Diagnosis not present

## 2016-09-08 DIAGNOSIS — N2581 Secondary hyperparathyroidism of renal origin: Secondary | ICD-10-CM | POA: Diagnosis not present

## 2016-09-08 DIAGNOSIS — T8242XA Displacement of vascular dialysis catheter, initial encounter: Secondary | ICD-10-CM | POA: Diagnosis not present

## 2016-09-10 DIAGNOSIS — N2581 Secondary hyperparathyroidism of renal origin: Secondary | ICD-10-CM | POA: Diagnosis not present

## 2016-09-10 DIAGNOSIS — I129 Hypertensive chronic kidney disease with stage 1 through stage 4 chronic kidney disease, or unspecified chronic kidney disease: Secondary | ICD-10-CM | POA: Diagnosis not present

## 2016-09-10 DIAGNOSIS — N186 End stage renal disease: Secondary | ICD-10-CM | POA: Diagnosis not present

## 2016-09-10 DIAGNOSIS — Z992 Dependence on renal dialysis: Secondary | ICD-10-CM | POA: Diagnosis not present

## 2016-09-11 ENCOUNTER — Inpatient Hospital Stay (HOSPITAL_COMMUNITY)
Admission: EM | Admit: 2016-09-11 | Discharge: 2016-09-14 | DRG: 193 | Disposition: A | Discharge status: Skilled Nursing Facility | Payer: Medicare Other | Attending: Family Medicine | Admitting: Family Medicine

## 2016-09-11 ENCOUNTER — Encounter (HOSPITAL_COMMUNITY): Payer: Self-pay

## 2016-09-11 ENCOUNTER — Emergency Department (HOSPITAL_COMMUNITY): Payer: Medicare Other

## 2016-09-11 DIAGNOSIS — I959 Hypotension, unspecified: Secondary | ICD-10-CM | POA: Diagnosis present

## 2016-09-11 DIAGNOSIS — R278 Other lack of coordination: Secondary | ICD-10-CM | POA: Diagnosis not present

## 2016-09-11 DIAGNOSIS — L89620 Pressure ulcer of left heel, unstageable: Secondary | ICD-10-CM | POA: Diagnosis present

## 2016-09-11 DIAGNOSIS — Z79899 Other long term (current) drug therapy: Secondary | ICD-10-CM

## 2016-09-11 DIAGNOSIS — I2510 Atherosclerotic heart disease of native coronary artery without angina pectoris: Secondary | ICD-10-CM | POA: Diagnosis present

## 2016-09-11 DIAGNOSIS — E113599 Type 2 diabetes mellitus with proliferative diabetic retinopathy without macular edema, unspecified eye: Secondary | ICD-10-CM | POA: Diagnosis present

## 2016-09-11 DIAGNOSIS — K219 Gastro-esophageal reflux disease without esophagitis: Secondary | ICD-10-CM | POA: Diagnosis present

## 2016-09-11 DIAGNOSIS — R404 Transient alteration of awareness: Secondary | ICD-10-CM | POA: Diagnosis not present

## 2016-09-11 DIAGNOSIS — Z6841 Body Mass Index (BMI) 40.0 and over, adult: Secondary | ICD-10-CM | POA: Diagnosis not present

## 2016-09-11 DIAGNOSIS — Z825 Family history of asthma and other chronic lower respiratory diseases: Secondary | ICD-10-CM

## 2016-09-11 DIAGNOSIS — Z823 Family history of stroke: Secondary | ICD-10-CM

## 2016-09-11 DIAGNOSIS — Z794 Long term (current) use of insulin: Secondary | ICD-10-CM | POA: Diagnosis not present

## 2016-09-11 DIAGNOSIS — I447 Left bundle-branch block, unspecified: Secondary | ICD-10-CM | POA: Diagnosis present

## 2016-09-11 DIAGNOSIS — E871 Hypo-osmolality and hyponatremia: Secondary | ICD-10-CM | POA: Diagnosis present

## 2016-09-11 DIAGNOSIS — J1000 Influenza due to other identified influenza virus with unspecified type of pneumonia: Secondary | ICD-10-CM | POA: Diagnosis not present

## 2016-09-11 DIAGNOSIS — Z992 Dependence on renal dialysis: Secondary | ICD-10-CM

## 2016-09-11 DIAGNOSIS — J9611 Chronic respiratory failure with hypoxia: Secondary | ICD-10-CM | POA: Diagnosis not present

## 2016-09-11 DIAGNOSIS — Z87891 Personal history of nicotine dependence: Secondary | ICD-10-CM

## 2016-09-11 DIAGNOSIS — J449 Chronic obstructive pulmonary disease, unspecified: Secondary | ICD-10-CM | POA: Diagnosis present

## 2016-09-11 DIAGNOSIS — I132 Hypertensive heart and chronic kidney disease with heart failure and with stage 5 chronic kidney disease, or end stage renal disease: Secondary | ICD-10-CM | POA: Diagnosis present

## 2016-09-11 DIAGNOSIS — D539 Nutritional anemia, unspecified: Secondary | ICD-10-CM | POA: Diagnosis present

## 2016-09-11 DIAGNOSIS — E1129 Type 2 diabetes mellitus with other diabetic kidney complication: Secondary | ICD-10-CM | POA: Diagnosis not present

## 2016-09-11 DIAGNOSIS — G2581 Restless legs syndrome: Secondary | ICD-10-CM | POA: Diagnosis present

## 2016-09-11 DIAGNOSIS — G4733 Obstructive sleep apnea (adult) (pediatric): Secondary | ICD-10-CM | POA: Diagnosis present

## 2016-09-11 DIAGNOSIS — M898X9 Other specified disorders of bone, unspecified site: Secondary | ICD-10-CM | POA: Diagnosis present

## 2016-09-11 DIAGNOSIS — N2581 Secondary hyperparathyroidism of renal origin: Secondary | ICD-10-CM | POA: Diagnosis present

## 2016-09-11 DIAGNOSIS — I272 Pulmonary hypertension, unspecified: Secondary | ICD-10-CM | POA: Diagnosis present

## 2016-09-11 DIAGNOSIS — E119 Type 2 diabetes mellitus without complications: Secondary | ICD-10-CM

## 2016-09-11 DIAGNOSIS — R5383 Other fatigue: Secondary | ICD-10-CM | POA: Diagnosis not present

## 2016-09-11 DIAGNOSIS — E1140 Type 2 diabetes mellitus with diabetic neuropathy, unspecified: Secondary | ICD-10-CM | POA: Diagnosis present

## 2016-09-11 DIAGNOSIS — J189 Pneumonia, unspecified organism: Secondary | ICD-10-CM | POA: Diagnosis not present

## 2016-09-11 DIAGNOSIS — Y95 Nosocomial condition: Secondary | ICD-10-CM | POA: Diagnosis present

## 2016-09-11 DIAGNOSIS — I480 Paroxysmal atrial fibrillation: Secondary | ICD-10-CM | POA: Diagnosis present

## 2016-09-11 DIAGNOSIS — Z8673 Personal history of transient ischemic attack (TIA), and cerebral infarction without residual deficits: Secondary | ICD-10-CM

## 2016-09-11 DIAGNOSIS — J410 Simple chronic bronchitis: Secondary | ICD-10-CM | POA: Diagnosis not present

## 2016-09-11 DIAGNOSIS — E039 Hypothyroidism, unspecified: Secondary | ICD-10-CM | POA: Diagnosis present

## 2016-09-11 DIAGNOSIS — E1122 Type 2 diabetes mellitus with diabetic chronic kidney disease: Secondary | ICD-10-CM | POA: Diagnosis present

## 2016-09-11 DIAGNOSIS — Z7982 Long term (current) use of aspirin: Secondary | ICD-10-CM

## 2016-09-11 DIAGNOSIS — Z9981 Dependence on supplemental oxygen: Secondary | ICD-10-CM | POA: Diagnosis not present

## 2016-09-11 DIAGNOSIS — N186 End stage renal disease: Secondary | ICD-10-CM

## 2016-09-11 DIAGNOSIS — Z8249 Family history of ischemic heart disease and other diseases of the circulatory system: Secondary | ICD-10-CM

## 2016-09-11 DIAGNOSIS — E1151 Type 2 diabetes mellitus with diabetic peripheral angiopathy without gangrene: Secondary | ICD-10-CM | POA: Diagnosis present

## 2016-09-11 DIAGNOSIS — R05 Cough: Secondary | ICD-10-CM | POA: Diagnosis not present

## 2016-09-11 DIAGNOSIS — I120 Hypertensive chronic kidney disease with stage 5 chronic kidney disease or end stage renal disease: Secondary | ICD-10-CM | POA: Diagnosis not present

## 2016-09-11 DIAGNOSIS — I5032 Chronic diastolic (congestive) heart failure: Secondary | ICD-10-CM | POA: Diagnosis not present

## 2016-09-11 DIAGNOSIS — J440 Chronic obstructive pulmonary disease with acute lower respiratory infection: Secondary | ICD-10-CM | POA: Diagnosis present

## 2016-09-11 DIAGNOSIS — M6281 Muscle weakness (generalized): Secondary | ICD-10-CM | POA: Diagnosis not present

## 2016-09-11 DIAGNOSIS — L89152 Pressure ulcer of sacral region, stage 2: Secondary | ICD-10-CM | POA: Diagnosis present

## 2016-09-11 DIAGNOSIS — R531 Weakness: Secondary | ICD-10-CM | POA: Diagnosis not present

## 2016-09-11 DIAGNOSIS — I252 Old myocardial infarction: Secondary | ICD-10-CM

## 2016-09-11 DIAGNOSIS — J111 Influenza due to unidentified influenza virus with other respiratory manifestations: Secondary | ICD-10-CM | POA: Diagnosis not present

## 2016-09-11 DIAGNOSIS — D638 Anemia in other chronic diseases classified elsewhere: Secondary | ICD-10-CM | POA: Diagnosis present

## 2016-09-11 DIAGNOSIS — Z9181 History of falling: Secondary | ICD-10-CM | POA: Diagnosis not present

## 2016-09-11 DIAGNOSIS — E785 Hyperlipidemia, unspecified: Secondary | ICD-10-CM | POA: Diagnosis present

## 2016-09-11 DIAGNOSIS — Z66 Do not resuscitate: Secondary | ICD-10-CM | POA: Diagnosis present

## 2016-09-11 DIAGNOSIS — R2681 Unsteadiness on feet: Secondary | ICD-10-CM | POA: Diagnosis not present

## 2016-09-11 DIAGNOSIS — E0852 Diabetes mellitus due to underlying condition with diabetic peripheral angiopathy with gangrene: Secondary | ICD-10-CM | POA: Diagnosis not present

## 2016-09-11 DIAGNOSIS — D631 Anemia in chronic kidney disease: Secondary | ICD-10-CM | POA: Diagnosis not present

## 2016-09-11 DIAGNOSIS — Z803 Family history of malignant neoplasm of breast: Secondary | ICD-10-CM

## 2016-09-11 HISTORY — DX: Old myocardial infarction: I25.2

## 2016-09-11 LAB — CBC WITH DIFFERENTIAL/PLATELET
BASOS ABS: 0.1 10*3/uL (ref 0.0–0.1)
Basophils Relative: 1 %
EOS PCT: 1 %
Eosinophils Absolute: 0.1 10*3/uL (ref 0.0–0.7)
HCT: 36.8 % (ref 36.0–46.0)
Hemoglobin: 11.4 g/dL — ABNORMAL LOW (ref 12.0–15.0)
LYMPHS PCT: 15 %
Lymphs Abs: 1.5 10*3/uL (ref 0.7–4.0)
MCH: 33.2 pg (ref 26.0–34.0)
MCHC: 31 g/dL (ref 30.0–36.0)
MCV: 107.3 fL — AB (ref 78.0–100.0)
MONO ABS: 0.8 10*3/uL (ref 0.1–1.0)
MONOS PCT: 8 %
Neutro Abs: 7.2 10*3/uL (ref 1.7–7.7)
Neutrophils Relative %: 75 %
PLATELETS: 159 10*3/uL (ref 150–400)
RBC: 3.43 MIL/uL — ABNORMAL LOW (ref 3.87–5.11)
RDW: 19.2 % — AB (ref 11.5–15.5)
WBC: 9.6 10*3/uL (ref 4.0–10.5)

## 2016-09-11 LAB — CBG MONITORING, ED: Glucose-Capillary: 237 mg/dL — ABNORMAL HIGH (ref 65–99)

## 2016-09-11 LAB — BASIC METABOLIC PANEL
Anion gap: 10 (ref 5–15)
BUN: 12 mg/dL (ref 6–20)
CALCIUM: 9.1 mg/dL (ref 8.9–10.3)
CO2: 26 mmol/L (ref 22–32)
CREATININE: 3.93 mg/dL — AB (ref 0.44–1.00)
Chloride: 97 mmol/L — ABNORMAL LOW (ref 101–111)
GFR calc Af Amer: 13 mL/min — ABNORMAL LOW (ref >60)
GFR calc non Af Amer: 11 mL/min — ABNORMAL LOW (ref >60)
Glucose, Bld: 249 mg/dL — ABNORMAL HIGH (ref 65–99)
Potassium: 3.5 mmol/L (ref 3.5–5.1)
Sodium: 133 mmol/L — ABNORMAL LOW (ref 135–145)

## 2016-09-11 LAB — I-STAT TROPONIN, ED: Troponin i, poc: 0.03 ng/mL (ref 0.00–0.08)

## 2016-09-11 LAB — GLUCOSE, CAPILLARY: GLUCOSE-CAPILLARY: 239 mg/dL — AB (ref 65–99)

## 2016-09-11 LAB — LACTIC ACID, PLASMA: LACTIC ACID, VENOUS: 2.7 mmol/L — AB (ref 0.5–1.9)

## 2016-09-11 LAB — I-STAT CG4 LACTIC ACID, ED: Lactic Acid, Venous: 2.82 mmol/L (ref 0.5–1.9)

## 2016-09-11 MED ORDER — HEPARIN SODIUM (PORCINE) 5000 UNIT/ML IJ SOLN
5000.0000 [IU] | Freq: Three times a day (TID) | INTRAMUSCULAR | Status: DC
  Administered 2016-09-11 – 2016-09-13 (×5): 5000 [IU] via SUBCUTANEOUS
  Filled 2016-09-11 (×2): qty 1

## 2016-09-11 MED ORDER — GUAIFENESIN-DM 100-10 MG/5ML PO SYRP
10.0000 mL | ORAL_SOLUTION | Freq: Four times a day (QID) | ORAL | Status: DC | PRN
  Administered 2016-09-11: 10 mL via ORAL
  Filled 2016-09-11 (×2): qty 10

## 2016-09-11 MED ORDER — SEVELAMER CARBONATE 2.4 G PO PACK
2.4000 g | PACK | Freq: Three times a day (TID) | ORAL | Status: DC
  Administered 2016-09-12: 2.4 g via ORAL
  Filled 2016-09-11 (×3): qty 1

## 2016-09-11 MED ORDER — SODIUM CHLORIDE 0.9 % IV SOLN: 250.0000 mL | INTRAVENOUS | Status: DC | PRN

## 2016-09-11 MED ORDER — ASPIRIN EC 81 MG PO TBEC
81.0000 mg | DELAYED_RELEASE_TABLET | Freq: Every day | ORAL | Status: DC
  Administered 2016-09-12 – 2016-09-14 (×3): 81 mg via ORAL
  Filled 2016-09-11 (×3): qty 1

## 2016-09-11 MED ORDER — SODIUM CHLORIDE 0.9 % IV BOLUS (SEPSIS)
250.0000 mL | Freq: Once | INTRAVENOUS | Status: AC
  Administered 2016-09-11: 250 mL via INTRAVENOUS

## 2016-09-11 MED ORDER — INSULIN ASPART 100 UNIT/ML ~~LOC~~ SOLN
0.0000 [IU] | Freq: Three times a day (TID) | SUBCUTANEOUS | Status: DC
  Administered 2016-09-12 (×2): 11 [IU] via SUBCUTANEOUS
  Administered 2016-09-12: 5 [IU] via SUBCUTANEOUS
  Administered 2016-09-13: 11 [IU] via SUBCUTANEOUS
  Administered 2016-09-14: 5 [IU] via SUBCUTANEOUS
  Administered 2016-09-14: 3 [IU] via SUBCUTANEOUS
  Administered 2016-09-14: 2 [IU] via SUBCUTANEOUS

## 2016-09-11 MED ORDER — RENA-VITE PO TABS
1.0000 | ORAL_TABLET | Freq: Every day | ORAL | Status: DC
  Administered 2016-09-12 – 2016-09-14 (×3): 1 via ORAL
  Filled 2016-09-11 (×3): qty 1

## 2016-09-11 MED ORDER — SODIUM CHLORIDE 0.9% FLUSH: 3.0000 mL | INTRAVENOUS | Status: DC | PRN

## 2016-09-11 MED ORDER — IPRATROPIUM-ALBUTEROL 0.5-2.5 (3) MG/3ML IN SOLN
3.0000 mL | Freq: Once | RESPIRATORY_TRACT | Status: AC
  Administered 2016-09-11: 3 mL via RESPIRATORY_TRACT
  Filled 2016-09-11: qty 3

## 2016-09-11 MED ORDER — DOCUSATE SODIUM 100 MG PO CAPS: 100.0000 mg | ORAL_CAPSULE | Freq: Every day | ORAL | Status: DC | PRN

## 2016-09-11 MED ORDER — LEVOTHYROXINE SODIUM 75 MCG PO TABS
75.0000 ug | ORAL_TABLET | Freq: Every day | ORAL | Status: DC
  Administered 2016-09-12 – 2016-09-14 (×3): 75 ug via ORAL
  Filled 2016-09-11 (×3): qty 1

## 2016-09-11 MED ORDER — SODIUM CHLORIDE 0.9% FLUSH
3.0000 mL | Freq: Two times a day (BID) | INTRAVENOUS | Status: DC
  Administered 2016-09-11 – 2016-09-14 (×6): 3 mL via INTRAVENOUS

## 2016-09-11 MED ORDER — MIDODRINE HCL 5 MG PO TABS
10.0000 mg | ORAL_TABLET | Freq: Two times a day (BID) | ORAL | Status: DC
  Administered 2016-09-11 – 2016-09-14 (×6): 10 mg via ORAL
  Filled 2016-09-11 (×6): qty 2

## 2016-09-11 MED ORDER — INSULIN GLARGINE 100 UNIT/ML ~~LOC~~ SOLN
40.0000 [IU] | Freq: Two times a day (BID) | SUBCUTANEOUS | Status: DC
  Administered 2016-09-12 – 2016-09-14 (×4): 40 [IU] via SUBCUTANEOUS
  Filled 2016-09-11 (×7): qty 0.4

## 2016-09-11 MED ORDER — VANCOMYCIN HCL 10 G IV SOLR
2000.0000 mg | Freq: Once | INTRAVENOUS | Status: AC
  Administered 2016-09-11: 2000 mg via INTRAVENOUS
  Filled 2016-09-11: qty 2000

## 2016-09-11 MED ORDER — ACETAMINOPHEN 325 MG PO TABS
650.0000 mg | ORAL_TABLET | Freq: Four times a day (QID) | ORAL | Status: DC | PRN
  Administered 2016-09-11: 650 mg via ORAL
  Filled 2016-09-11: qty 2

## 2016-09-11 MED ORDER — CEFEPIME HCL 1 G IJ SOLR
1.0000 g | INTRAMUSCULAR | Status: DC
  Administered 2016-09-12: 1 g via INTRAVENOUS
  Filled 2016-09-11: qty 1

## 2016-09-11 MED ORDER — SALINE SPRAY 0.65 % NA SOLN
1.0000 | NASAL | Status: DC | PRN
  Filled 2016-09-11: qty 44

## 2016-09-11 MED ORDER — IPRATROPIUM-ALBUTEROL 0.5-2.5 (3) MG/3ML IN SOLN
3.0000 mL | RESPIRATORY_TRACT | Status: DC | PRN
  Administered 2016-09-13: 3 mL via RESPIRATORY_TRACT
  Filled 2016-09-11: qty 3

## 2016-09-11 MED ORDER — DEXTROSE 5 % IV SOLN
1.0000 g | Freq: Once | INTRAVENOUS | Status: AC
  Administered 2016-09-11: 1 g via INTRAVENOUS
  Filled 2016-09-11: qty 1

## 2016-09-11 MED ORDER — NYSTATIN 100000 UNIT/GM EX POWD
Freq: Three times a day (TID) | CUTANEOUS | Status: DC
  Administered 2016-09-11 – 2016-09-14 (×9): via TOPICAL
  Filled 2016-09-11: qty 15

## 2016-09-11 MED ORDER — PRO-STAT SUGAR FREE PO LIQD
30.0000 mL | Freq: Every day | ORAL | Status: DC
  Administered 2016-09-12: 30 mL via ORAL
  Filled 2016-09-11: qty 30

## 2016-09-11 MED ORDER — INSULIN ASPART 100 UNIT/ML ~~LOC~~ SOLN
0.0000 [IU] | Freq: Every day | SUBCUTANEOUS | Status: DC
  Administered 2016-09-11: 2 [IU] via SUBCUTANEOUS
  Administered 2016-09-13: 4 [IU] via SUBCUTANEOUS

## 2016-09-11 MED ORDER — INSULIN ASPART 100 UNIT/ML ~~LOC~~ SOLN
4.0000 [IU] | Freq: Three times a day (TID) | SUBCUTANEOUS | Status: DC
  Administered 2016-09-12 – 2016-09-14 (×7): 4 [IU] via SUBCUTANEOUS

## 2016-09-11 NOTE — ED Triage Notes (Signed)
Per [facility]: [facility]. Pt is complaining of generalized weakness after physical therapy. Pt is on 2 L [place] at all times. Pt does dialysis Monday, Wednesday, Friday, unable to do it today due to the holiday.

## 2016-09-11 NOTE — ED Notes (Signed)
No addl lab draw,  Pt enroute to inpatient 

## 2016-09-11 NOTE — ED Notes (Signed)
CBG: 237 RN notified. 

## 2016-09-11 NOTE — Progress Notes (Signed)
Pharmacy Antibiotic Note Kimberly [name] is a 61 y.o. female admitted on 09/11/2016 with pneumonia.  Pharmacy has been consulted for vancomycin dosing. Patient receiving cefepime 1g IV x1 in ED. Patient has ESRD receiving HD MWF. Last HD was Sunday. No vancomycin was received in HD.   Plan: Vancomycin 2000mg  IV once now then follow-up HD schedule for further dosing.  Cefepime 1 gram IV every 24 hours  Height: 5\' 4"  (162.6 cm) Weight: 274 lb (124.3 kg) IBW/kg (Calculated) : 54.7  Temp (24hrs), Avg:99.3 F (37.4 C), Min:97.9 F (36.6 C), Max:100.6 F (38.1 C)   Recent Labs Lab 09/06/16 09/11/16 1415 09/11/16 1825  WBC 12.9 9.6  --   CREATININE 6.1* 3.93*  --   LATICACIDVEN  --   --  2.82*    Estimated Creatinine Clearance: 19.6 mL/min (by C-G formula based on SCr of 3.93 mg/dL (H)).    Allergies  Allergen Reactions  . Ciprofloxacin Itching  . Epinephrine     Increased heart rate    Antimicrobials this admission: Cefepime 1/1 >>  Vancomycin 1/1 >>  Dose adjustments this admission: n/a  Microbiology results: 1/1 BCx:  Thank you for allowing pharmacy to be a part of this patient's care.  Pollyann SamplesAndy Tattiana Fakhouri, PharmD, BCPS 09/11/2016, 7:56 PM Pager: [phone]

## 2016-09-11 NOTE — H&P (Signed)
History and Physical    Kimberly ManilaSusan K Rann ZOX:[id number] DOB: Jun 18, 1956 DOA: 09/11/2016  PCP: Ginette OttoSTONEKING,[name] THOMAS, MD   Patient coming from: Nursing Home  Chief Complaint: Cough, dyspnea, gen weakness, fatigue  HPI: Kimberly ManilaSusan K [name] is a 61 y.o. female with medical history significant for end-stage renal disease, COPD with chronic hypoxic respiratory failure, chronic diastolic CHF, hypothyroidism, and insulin-dependent diabetes mellitus who presents from her nursing facility for several days of dyspnea, cough, generalized weakness, and malaise. Patient was discharged from the hospital on 08/31/2016 after being managed for acute on chronic respiratory failure secondary to acute CHF. She returned to the nursing home and much improved and stable condition, but reports that over the past several days, she has had progressive dyspnea, initially with exertion, and now at rest. She is also had increased cough over that interval, marked fatigue, and a general malaise. She reports some subjective fevers and chills at the nursing facility, but no fever has been documented. She denies chest pain or palpitations and denies any increase in her lower extremity edema.   ED Course: Upon arrival to the ED, patient is found to be febrile to 38.1 C, saturating adequately on her usual 2 L/m of supplemental oxygen, mildly hypotensive, and with vitals otherwise stable. EKG features a sinus rhythm with chronic left bundle branch block and chest x-ray is notable for an increased consolidation in the left middle and left lower lobes. Chemistry panel reflects a chronic hyponatremia with sodium 133 and a serum glucose of 249. CBC is notable for a stable macrocytic anemia with hemoglobin of 11.4 and MCV of 107.3. Troponin is within normal limits and lactic acid is elevated to a value of 2.82. Blood cultures were obtained in the ED, a torn 50 mL normal saline bolus was given, and the patient was treated with empiric cefepime, vancomycin,  and DuoNeb. Blood pressure remains soft but stable in the emergency department and the patient is not in acute respiratory distress. She will be admitted to the telemetry unit for ongoing evaluation and management of HCAP.  Review of Systems:  All other systems reviewed and apart from HPI, are negative.  Past Medical History:  Diagnosis Date  . Anemia   . Anxiety   . Arthritis    "knees, feet, hands" (08/23/2016)  . Asthma   . CAD, NATIVE VESSEL    May 10, 2010 cath showed a hyperdynamic LV function, she had dominant circumflex anatomy with a 70-80% small OM1. She had diffuse diabetic plaque particularly in the distal LAD. She nondominant RCA.  Nondominant  . Cellulitis 10/15/2013  . CHF (congestive heart failure) (HCC)    Preserved EF  . Chronic bronchitis (HCC)    "probably once/ yr" (08/23/2016)  . Complication of anesthesia    "I've had difficulty waking up" (08/23/2016)  . COPD   . Depression   . Diabetic neuropathy (HCC)   . Endometrial hyperplasia   . ESRD (end stage renal disease) on dialysis (HCC)    "[facility]; MWF; [facility]" (08/23/2016)  . GERD (gastroesophageal reflux disease)   . History of hiatal hernia   . HYPERLIPIDEMIA   . HYPERTENSION   . Hypothyroidism   . Myocardial infarct, old   . On home oxygen therapy    "2L; 24/7" (08/23/2016)  . Paralyzed vocal cords   . Peripheral neuropathy (HCC)    hx/notes 01/27/2010  . Pneumonia "several times"  . Proliferative retinopathy    hx/notes 01/27/2010  . PVD    CEA  .  Restless leg syndrome    "mostly on the right" (08/23/2016)  . Sleep apnea    not on cpap    . 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 Type II diabetes mellitus (HCC)     Past Surgical History:  Procedure Laterality Date  . AV FISTULA PLACEMENT Right 07/08/2015   Procedure: EXPLORATION RIGHT AXILLARY ARTERY AND RIGHT  BRACHIAL VEIN;  Surgeon: Chuck Hint, MD;  Location: [facility] OR;  Service: Vascular;  Laterality: Right;  . CAROTID ENDARTERECTOMY Left X 2  . CATARACT EXTRACTION W/ INTRAOCULAR LENS  IMPLANT, BILATERAL Bilateral   . CERVICAL BIOPSY  ~ 2015   "precancerous cells"  . COLONOSCOPY    . EYE SURGERY Bilateral    numerous surgeries  . INSERTION OF DIALYSIS CATHETER N/A 06/24/2015   Procedure: ULTRASOUND BILATERAL INTERNAL JUGULAR VEIN INSERTION OF DIALYSIS CATHETER LEFT INTERNAL JUGULAR VEIN ;  Surgeon: Pryor Ochoa, MD;  Location: [facility] OR;  Service: Vascular;  Laterality: N/A;  . INTRAUTERINE DEVICE INSERTION  ~ 2015  . VITRECTOMY Bilateral      reports that she quit smoking about 11 years ago. Her smoking use included Cigarettes. She has a 20.00 pack-year smoking history. She has never used smokeless tobacco. She reports that she does not drink alcohol or use drugs.  Allergies  Allergen Reactions  . Ciprofloxacin Itching  . Epinephrine     Increased heart rate    Family History  Problem Relation Age of Onset  . Cancer Mother     Breast, NHL  . Stroke Mother   . Peripheral vascular disease Father   . CAD Father 65  . Heart attack Father   . Hypertension Father   . Asthma Father   . Heart disease Father     before age 27     Prior to Admission medications   Medication Sig Start Date End Date Taking? Authorizing Provider  Amino Acids-Protein Hydrolys (FEEDING SUPPLEMENT, PRO-STAT SUGAR FREE 64,) LIQD Take 30 mLs by mouth daily.   Yes Historical Provider, MD  aspirin EC 81 MG tablet Take 81 mg by mouth daily.   Yes Historical Provider, MD  guaiFENesin-dextromethorphan (ROBITUSSIN DM) 100-10 MG/5ML syrup Take 10 mLs by mouth every 6 (six) hours as needed for cough.   Yes Historical Provider, MD  insulin glargine (LANTUS) 100 UNIT/ML injection Inject 0.4 mLs (40 Units total) into the skin 2 (two) times daily. 08/31/16  Yes Dorothea Ogle, MD  insulin lispro (HUMALOG) 100 UNIT/ML  injection Inject 6 Units into the skin 3 (three) times daily before meals.   Yes Historical Provider, MD  ipratropium-albuterol (DUONEB) 0.5-2.5 (3) MG/3ML SOLN Take 3 mLs by nebulization every 8 (eight) hours as needed.   Yes Historical Provider, MD  levothyroxine (SYNTHROID, LEVOTHROID) 75 MCG tablet Take 75 mcg by mouth daily before breakfast.   Yes Historical Provider, MD  midodrine (PROAMATINE) 10 MG tablet Take 1 tablet (10 mg total) by mouth 2 (two) times daily with a meal. 07/20/15  Yes Nishant Dhungel, MD  multivitamin (RENA-VIT) TABS tablet Take 1 tablet by mouth daily.   Yes Historical Provider, MD  nystatin (MYCOSTATIN/NYSTOP) powder Apply topically 3 (three) times daily. 08/31/16  Yes Dorothea Ogle, MD  sevelamer carbonate (RENVELA) 2.4 g PACK Take 2.4 g by mouth 3 (three) times daily with meals. 08/31/16  Yes Trevor Iha  Izola Price, MD  acetaminophen (TYLENOL) 325 MG tablet Take 650 mg by mouth every 6 (six) hours as needed for mild pain.    Historical Provider, MD  docusate sodium (COLACE) 100 MG capsule Take 100 mg by mouth daily as needed for mild constipation.    Historical Provider, MD  hydrocerin (EUCERIN) CREA Apply 1 application topically 2 (two) times daily. 07/20/15   Nishant Dhungel, MD  levonorgestrel (MIRENA) 20 MCG/24HR IUD 1 Intra Uterine Device (1 each total) by Intrauterine route once. 08/16/15   Adolphus Birchwood, MD  OXYGEN 2lpm 24/7- [facility]    Historical Provider, MD  sodium chloride (OCEAN) 0.65 % SOLN nasal spray Place 1 spray into both nostrils as needed for congestion.    Historical Provider, MD    Physical Exam: Vitals:   09/11/16 1830 09/11/16 1857 09/11/16 1858 09/11/16 1900  BP: (!) 103/50 (!) 89/49 (!) 94/47 (!) 94/51  Pulse:  96 95 93  Resp:  21 10 17   Temp:  100.6 F (38.1 C)    TempSrc:  Rectal    SpO2:  95% 96% 97%  Weight:      Height:          Constitutional: Not in acute distress, no pallor or cyanosis, obese Eyes: PERTLA, lids and conjunctivae  normal ENMT: Mucous membranes are dry, cracking. Posterior pharynx clear of any exudate or lesions.   Neck: normal, supple, no masses, no thyromegaly Respiratory: Rhonchi throughout left lung fields. Mild tachypnea. No accessory muscle use.  Cardiovascular: S1 & S2 heard, regular rate and rhythm. No significant JVD. Abdomen: No distension, no tenderness, no masses palpated. Bowel sounds normal.  Musculoskeletal: no clubbing / cyanosis. No joint deformity upper and lower extremities. Normal muscle tone.  Skin: no significant rashes, lesions, ulcers. Warm, dry, well-perfused. Poor turgor. Neurologic: CN 2-12 grossly intact. Sensation intact, DTR normal. Strength 5/5 in all 4 limbs.  Psychiatric: Normal judgment and insight. Alert and oriented x 3. Normal mood and affect.     Labs on Admission: I have personally reviewed following labs and imaging studies  CBC:  Recent Labs Lab 09/06/16 09/11/16 1415  WBC 12.9 9.6  NEUTROABS 11 7.2  HGB 10.5* 11.4*  HCT 34* 36.8  MCV  --  107.3*  PLT 189 159   Basic Metabolic Panel:  Recent Labs Lab 09/06/16 09/11/16 1415  NA 133* 133*  K 3.9 3.5  CL  --  97*  CO2  --  26  GLUCOSE  --  249*  BUN 22* 12  CREATININE 6.1* 3.93*  CALCIUM  --  9.1   GFR: Estimated Creatinine Clearance: 19.6 mL/min (by C-G formula based on SCr of 3.93 mg/dL (H)). Liver Function Tests: No results for input(s): AST, ALT, ALKPHOS, BILITOT, PROT, ALBUMIN in the last 168 hours. No results for input(s): LIPASE, AMYLASE in the last 168 hours. No results for input(s): AMMONIA in the last 168 hours. Coagulation Profile: No results for input(s): INR, PROTIME in the last 168 hours. Cardiac Enzymes: No results for input(s): CKTOTAL, CKMB, CKMBINDEX, TROPONINI in the last 168 hours. BNP (last 3 results) No results for input(s): PROBNP in the last 8760 hours. HbA1C: No results for input(s): HGBA1C in the last 72 hours. CBG:  Recent Labs Lab 09/11/16 1649  GLUCAP  237*   Lipid Profile: No results for input(s): CHOL, HDL, LDLCALC, TRIG, CHOLHDL, LDLDIRECT in the last 72 hours. Thyroid Function Tests: No results for input(s): TSH, T4TOTAL, FREET4, T3FREE, THYROIDAB in the last 72 hours. Anemia  Panel: No results for input(s): VITAMINB12, FOLATE, FERRITIN, TIBC, IRON, RETICCTPCT in the last 72 hours. Urine analysis:    Component Value Date/Time   COLORURINE RED (A) 07/08/2015 1705   APPEARANCEUR TURBID (A) 07/08/2015 1705   LABSPEC 1.023 07/08/2015 1705   PHURINE 5.0 07/08/2015 1705   GLUCOSEU 100 (A) 07/08/2015 1705   HGBUR MODERATE (A) 07/08/2015 1705   BILIRUBINUR MODERATE (A) 07/08/2015 1705   KETONESUR 15 (A) 07/08/2015 1705   PROTEINUR 100 (A) 07/08/2015 1705   UROBILINOGEN 1.0 07/08/2015 1705   NITRITE POSITIVE (A) 07/08/2015 1705   LEUKOCYTESUR SMALL (A) 07/08/2015 1705   Sepsis Labs: @LABRCNTIP (procalcitonin:4,lacticidven:4) )No results found for this or any previous visit (from the past 240 hour(s)).   Radiological Exams on Admission: Dg Chest 2 View  Result Date: 09/11/2016 CLINICAL DATA:  Patient with generalized weakness and fatigue. EXAM: CHEST  2 VIEW COMPARISON:  Chest radiograph 08/25/2016. FINDINGS: Central venous catheter tip projects over the right atrium. Re- demonstrated cardiomegaly. Pulmonary vascular redistribution and interstitial opacities bilaterally. Additionally, there is suggestion of increased consolidation within the left mid and lower lung. No pleural effusion or pneumothorax. Thoracic spine degenerative changes. IMPRESSION: Suggestion of increased consolidation within the left mid and lower lung which may be secondary to overlapping breast tissue however pulmonary consolidation in the setting of edema or pneumonia is not excluded. Consider further evaluation with PA chest radiograph. Cardiomegaly. Electronically Signed   By: Annia Belt M.D.   On: 09/11/2016 16:08    EKG: Independently reviewed. Sinus rhythm,  chronic LBBB  Assessment/Plan  1. HCAP  - Pt presents with cough, subjective fevers, malaise, found to have left-sided PNA  - There is no leukocytosis present, but a low-grade temp and elevated lactate  - Blood cultures were obtain in the ED and she was bolused with 250 cc NS  - Check sputum culture and gram stain, respiratory virus panel, and strep pneumo urine antigens  - She was recently hospitalized for a week and will be treated with empiric vancomycin and Cefepime initially   2. ESRD  - Pt is typically dialyzed MWF, but had HD on 09/10/16 according to holiday schedule - No indications for urgent HD on admission  - She is dry on admission in setting of acute infection and was given 250 cc NS in ED; BP is soft and she is given another 250 cc on admission   3. Chronic diastolic CHF  - TTE (07/09/15) with EF 55-60%, mild concentric hypertrophy, grade 2 diastolic dysfunction, mild MS, and severe pulmonary HTN  - She appears dry on admission and has received 500 cc NS  - Following strict I/Os, fluid-restricting diet    4. COPD, chronic hypoxic respiratory failure   - There are rhonchi over left lung zones, but no appreciable wheezing or obstructive breathing after receiving DuoNeb in ED  - Continue prn nebs   5. Hyponatremia - Serum sodium is 133 on admission  - She is dry on admission and given small boluses IVF, but hyponatremia appears to be chronic and stable  - Chem panel will be repeated in morning   6. Hypothyroidism - Continue Synthroid    7. Macrocytic anemia  - Hgb is 11.4 on admission and stable with no s/s of bleeding    8. Insulin-dependent DM  - A1c 9.9% in December 2017 - Managed at home with Lantus 40 units BID and Humalog 6 units TID with meals  - Check CBG with meals and qHS  - Continue  Lantus 40 units BID with Novolog 4 units TID and a moderate-intensity SSI    DVT prophylaxis: sq heparin Code Status: DNR Family Communication: Discussed with  patient Disposition Plan: Admit to telemetry Consults called: None Admission status: Inpatient    Briscoe Deutscher, MD [facility] Pager [phone]  If 7PM-7AM, please contact night-coverage [email or web address] Password [facility]  09/11/2016, 7:53 PM

## 2016-09-11 NOTE — ED Provider Notes (Signed)
[facility]-EMERGENCY DEPT Provider Note   CSN: [id number] Arrival date & time: 09/11/16  1256     History   Chief Complaint Chief Complaint  Patient presents with  . Fatigue    generalized weakness    HPI Kimberly [name] is a 61 y.o. female on dialysis M/W/F seen yesterday instead of today due to holiday, diabetes on insulin, CHF, COPD, and asthma  presents to the ED today for complaints of fatigue after today's physical therapy session and  worsening nonproductive cough for the last 3-4 days. Patient states that she had dizziness, felt like the lights were spinning. She reports being restricted on liquids due to her dialysis at the nursing home. Patient states that she thinks she acquired some type of upper respiratory infection or pneumonia from the skilled nursing facility she is at now. She states it feels like the last time she had pneumonia. She admits to associated shortness of breath. She is on 2L at home. Patient denies chest pain, nausea, vomiting.  HPI  Past Medical History:  Diagnosis Date  . Anemia   . Anxiety   . Arthritis    "knees, feet, hands" (08/23/2016)  . Asthma   . CAD, NATIVE VESSEL    May 10, 2010 cath showed a hyperdynamic LV function, she had dominant circumflex anatomy with a 70-80% small OM1. She had diffuse diabetic plaque particularly in the distal LAD. She nondominant RCA.  Nondominant  . Cellulitis 10/15/2013  . CHF (congestive heart failure) (HCC)    Preserved EF  . Chronic bronchitis (HCC)    "probably once/ yr" (08/23/2016)  . Complication of anesthesia    "I've had difficulty waking up" (08/23/2016)  . COPD   . Depression   . Diabetic neuropathy (HCC)   . Endometrial hyperplasia   . ESRD (end stage renal disease) on dialysis (HCC)    "[facility]; MWF; [facility]" (08/23/2016)  . GERD (gastroesophageal reflux disease)   . History of hiatal hernia   . HYPERLIPIDEMIA   . HYPERTENSION   . Hypothyroidism   . Myocardial infarct, old   . On home  oxygen therapy    "2L; 24/7" (08/23/2016)  . Paralyzed vocal cords   . Peripheral neuropathy (HCC)    hx/notes 01/27/2010  . Pneumonia "several times"  . Proliferative retinopathy    hx/notes 01/27/2010  . PVD    CEA  . Restless leg syndrome    "mostly on the right" (08/23/2016)  . Sleep apnea    not on cpap    . 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 Type II diabetes mellitus [facility])     Patient Active Problem List   Diagnosis Date Noted  . Pressure injury of skin 08/29/2016  . Cellulitis of leg 08/29/2016  . Chest pain 08/28/2016  . Elevated troponin 08/28/2016  . 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 Hyperkalemia 08/23/2016  . Shortness of breath 08/23/2016  . Hyponatremia 08/23/2016  . Hypotension arterial 09/30/2015  . LBBB (left bundle branch block) 09/30/2015  . Complex endometrial hyperplasia with atypia 09/02/2015  . Severe obesity (HCC) 07/29/2015  . Acute respiratory failure with hypoxemia (HCC) 07/20/2015  . Hypotension 07/20/2015  . Uncontrolled type 2 diabetes mellitus (HCC) 07/20/2015  . Vaginal bleeding   . Altered mental status   . Pulmonary arterial hypertension   . Acute on chronic diastolic congestive heart failure (HCC)   .  Paroxysmal atrial fibrillation (HCC) 07/11/2015  . ESRD (end stage renal disease) (HCC) 07/08/2015  . Diabetic ulcer of right foot associated with diabetes mellitus due to underlying condition (HCC)   . Pressure ulcer 04/07/2015  . Obesity hypoventilation syndrome (HCC) 08/05/2014  . Morbid obesity (HCC) 06/28/2014  . Physical deconditioning 06/28/2014  . Hypothyroidism 06/10/2014  . Carotid stenosis 12/02/2013  . Varicose veins of lower extremities with other complications 12/02/2013  . Chronic respiratory failure with hypoxia (HCC) 10/13/2013  . On home oxygen therapy 10/13/2013  . Chronic diastolic CHF (congestive heart  failure) (HCC) 09/12/2013  . CAD (coronary artery disease), native coronary artery   . DM (diabetes mellitus), type 2 with renal complications (HCC) 05/31/2010  . Hyperlipidemia associated with type 2 diabetes mellitus (HCC) 05/31/2010  . COPD (chronic obstructive pulmonary disease) (HCC) 05/31/2010  . Hypertensive heart disease     Past Surgical History:  Procedure Laterality Date  . AV FISTULA PLACEMENT Right 07/08/2015   Procedure: EXPLORATION RIGHT AXILLARY ARTERY AND RIGHT BRACHIAL VEIN;  Surgeon: Chuck Hint, MD;  Location: [facility] OR;  Service: Vascular;  Laterality: Right;  . CAROTID ENDARTERECTOMY Left X 2  . CATARACT EXTRACTION W/ INTRAOCULAR LENS  IMPLANT, BILATERAL Bilateral   . CERVICAL BIOPSY  ~ 2015   "precancerous cells"  . COLONOSCOPY    . EYE SURGERY Bilateral    numerous surgeries  . INSERTION OF DIALYSIS CATHETER N/A 06/24/2015   Procedure: ULTRASOUND BILATERAL INTERNAL JUGULAR VEIN INSERTION OF DIALYSIS CATHETER LEFT INTERNAL JUGULAR VEIN ;  Surgeon: Pryor Ochoa, MD;  Location: [facility] OR;  Service: Vascular;  Laterality: N/A;  . INTRAUTERINE DEVICE INSERTION  ~ 2015  . VITRECTOMY Bilateral     OB History    No data available       Home Medications    Prior to Admission medications   Medication Sig Start Date End Date Taking? Authorizing Provider  Amino Acids-Protein Hydrolys (FEEDING SUPPLEMENT, PRO-STAT SUGAR FREE 64,) LIQD Take 30 mLs by mouth daily.   Yes Historical Provider, MD  aspirin EC 81 MG tablet Take 81 mg by mouth daily.   Yes Historical Provider, MD  guaiFENesin-dextromethorphan (ROBITUSSIN DM) 100-10 MG/5ML syrup Take 10 mLs by mouth every 6 (six) hours as needed for cough.   Yes Historical Provider, MD  insulin glargine (LANTUS) 100 UNIT/ML injection Inject 0.4 mLs (40 Units total) into the skin 2 (two) times daily. 08/31/16  Yes Dorothea Ogle, MD  insulin lispro (HUMALOG) 100 UNIT/ML injection Inject 6 Units into the skin 3 (three) times  daily before meals.   Yes Historical Provider, MD  ipratropium-albuterol (DUONEB) 0.5-2.5 (3) MG/3ML SOLN Take 3 mLs by nebulization every 8 (eight) hours as needed.   Yes Historical Provider, MD  levothyroxine (SYNTHROID, LEVOTHROID) 75 MCG tablet Take 75 mcg by mouth daily before breakfast.   Yes Historical Provider, MD  midodrine (PROAMATINE) 10 MG tablet Take 1 tablet (10 mg total) by mouth 2 (two) times daily with a meal. 07/20/15  Yes Nishant Dhungel, MD  multivitamin (RENA-VIT) TABS tablet Take 1 tablet by mouth daily.   Yes Historical Provider, MD  nystatin (MYCOSTATIN/NYSTOP) powder Apply topically 3 (three) times daily. 08/31/16  Yes Dorothea Ogle, MD  sevelamer carbonate (RENVELA) 2.4 g PACK Take 2.4 g by mouth 3 (three) times daily with meals. 08/31/16  Yes Dorothea Ogle, MD  acetaminophen (TYLENOL) 325 MG tablet Take 650 mg by mouth every 6 (six) hours as needed for mild pain.  Historical Provider, MD  docusate sodium (COLACE) 100 MG capsule Take 100 mg by mouth daily as needed for mild constipation.    Historical Provider, MD  hydrocerin (EUCERIN) CREA Apply 1 application topically 2 (two) times daily. 07/20/15   Nishant Dhungel, MD  insulin aspart (NOVOLOG) 100 UNIT/ML injection Inject 6 Units into the skin 3 (three) times daily with meals. 07/05/14   Zannie Cove, MD  levonorgestrel (MIRENA) 20 MCG/24HR IUD 1 Intra Uterine Device (1 each total) by Intrauterine route once. 08/16/15   Adolphus Birchwood, MD  OXYGEN 2lpm 24/7- [facility]    Historical Provider, MD  sodium chloride (OCEAN) 0.65 % SOLN nasal spray Place 1 spray into both nostrils as needed for congestion.    Historical Provider, MD  traZODone (DESYREL) 50 MG tablet Take 0.5 tablets (25 mg total) by mouth at bedtime as needed for sleep. Patient not taking: Reported on 09/11/2016 08/31/16   Dorothea Ogle, MD    Family History Family History  Problem Relation Age of Onset  . Cancer Mother     Breast, NHL  . Stroke Mother   .  Peripheral vascular disease Father   . CAD Father 30  . Heart attack Father   . Hypertension Father   . Asthma Father   . Heart disease Father     before age 60    Social History Social History  Substance Use Topics  . Smoking status: Former Smoker    Packs/day: 1.00    Years: 20.00    Types: Cigarettes    Quit date: 09/11/2005  . Smokeless tobacco: Never Used  . Alcohol use No     Allergies   Ciprofloxacin and Epinephrine   Review of Systems Review of Systems  Constitutional: Negative for chills and fever.  HENT: Positive for congestion. Negative for sore throat and trouble swallowing.   Eyes: Negative for pain and visual disturbance.  Respiratory: Negative for cough and shortness of breath.   Cardiovascular: Negative for chest pain and palpitations.  Gastrointestinal: Positive for diarrhea. Negative for abdominal pain and vomiting.  Genitourinary: Negative for dysuria and hematuria.  Musculoskeletal: Negative for arthralgias and back pain.  Skin: Negative for color change and rash.  Neurological: Negative for seizures and syncope.     Physical Exam Updated Vital Signs BP 110/56   Pulse 97   Temp 97.9 F (36.6 C)   Resp (!) 30   Ht 5\' 4"  (1.626 m)   Wt 124.3 kg   SpO2 95%   BMI 47.03 kg/m   Physical Exam  Constitutional: She is oriented to person, place, and time. She appears well-developed and well-nourished.  HENT:  Head: Normocephalic and atraumatic.  Nose: Nose normal.  Eyes: Conjunctivae and EOM are normal. Pupils are equal, round, and reactive to light.  Neck: Normal range of motion. Neck supple.  Cardiovascular: Normal rate and normal heart sounds.   Pulmonary/Chest: Effort normal. No accessory muscle usage. Tachypnea noted. No respiratory distress. She has wheezes. She has rales. She exhibits no tenderness.  On 2 L of oxygen.   Abdominal: Soft. Bowel sounds are normal. There is tenderness (epigastric). There is no rebound and no guarding.    Musculoskeletal: Normal range of motion.  Neurological: She is alert and oriented to person, place, and time. A sensory deficit (Lower legs- Chronic neuropathy) is present.  Cranial Nerves:  III,IV, VI: ptosis not present, extra-ocular movements intact bilaterally, direct and consensual pupillary light reflexes intact bilaterally V: facial sensation, jaw opening, and bite  strength equal bilaterally VII: eyebrow raise, eyelid close, smile, frown, pucker equal bilaterally VIII: hearing grossly normal bilaterally  IX,X: palate elevation and swallowing intact XI: bilateral shoulder shrug and lateral head rotation equal and strong XII: midline tongue extension.  Negative pronator drift, negative finger to nose, negative RAMs.   Patient not physically able to ambulate.  Skin: Skin is warm. Capillary refill takes less than 2 seconds.  Psychiatric: She has a normal mood and affect. Her behavior is normal.  Nursing note and vitals reviewed.    ED Treatments / Results  Labs (all labs ordered are listed, but only abnormal results are displayed) Labs Reviewed  CBC WITH DIFFERENTIAL/PLATELET - Abnormal; Notable for the following:       Result Value   RBC 3.43 (*)    Hemoglobin 11.4 (*)    MCV 107.3 (*)    RDW 19.2 (*)    All other components within normal limits  BASIC METABOLIC PANEL - Abnormal; Notable for the following:    Sodium 133 (*)    Chloride 97 (*)    Glucose, Bld 249 (*)    Creatinine, Ser 3.93 (*)    GFR calc non Af Amer 11 (*)    GFR calc Af Amer 13 (*)    All other components within normal limits  I-STAT TROPOININ, ED    EKG  EKG Interpretation  Date/Time:  Monday September 11 2016 13:06:34 EST Ventricular Rate:  98 PR Interval:    QRS Duration: 139 QT Interval:  410 QTC Calculation: 524 R Axis:   -4 Text Interpretation:  Sinus rhythm Left bundle branch block Confirmed by Adriana SimasOOK  MD, BRIAN ([id number]) on 09/11/2016 2:48:43 PM       Radiology Dg Chest 2  View  Result Date: 09/11/2016 CLINICAL DATA:  Patient with generalized weakness and fatigue. EXAM: CHEST  2 VIEW COMPARISON:  Chest radiograph 08/25/2016. FINDINGS: Central venous catheter tip projects over the right atrium. Re- demonstrated cardiomegaly. Pulmonary vascular redistribution and interstitial opacities bilaterally. Additionally, there is suggestion of increased consolidation within the left mid and lower lung. No pleural effusion or pneumothorax. Thoracic spine degenerative changes. IMPRESSION: Suggestion of increased consolidation within the left mid and lower lung which may be secondary to overlapping breast tissue however pulmonary consolidation in the setting of edema or pneumonia is not excluded. Consider further evaluation with PA chest radiograph. Cardiomegaly. Electronically Signed   By: Annia Beltrew  Davis M.D.   On: 09/11/2016 16:08    Procedures Procedures (including critical care time)  Medications Ordered in ED Medications - No data to display   Initial Impression / Assessment and Plan / ED Course  I have reviewed the triage vital signs and the nursing notes.  Pertinent labs & imaging results that were available during my care of the patient were reviewed by me and considered in my medical decision making (see chart for details).  Clinical Course   Patient is a 61 year old female presenting from her skilled nursing facility after feeling fatigued from a physical therapy session. She also states that she has been having worsening non productive cough for the last 4 days. On exam patient afebrile, slightly tachycardic, slightly tachypneic. Blood pressure is slightly low. Patient on 2 L of oxygen and satting at 95%. Patient in no apparent distress. Heart sounds clear. Wheezing and rales noted on lung auscultation. Abdomen soft/ Mild tenderness on epigastric region.  Cranial nerves III through XII intact. Negative pronator drift, negative finger to nose, negative RAMs. Patient not  physically able to ambulate. Glucose is normal at 249. Patient's lab work is fairly consistent with previous findings. EKG shows no acute findings at this time.   Chest x-ray shows increased consolidation within the left mid and lower lung which may be secondary to overlapping breast tissue however, pulmonary consolidation in the setting of edema or pneumonia is not excluded.   At shift change care was transferred to Yancey Flemings, PA-C who will follow pending studies, re-evaulate and determine disposition.     Final Clinical Impressions(s) / ED Diagnoses   Final diagnoses:  None    New Prescriptions New Prescriptions   No medications on file     [address] [place], [place] 09/11/16 1634    Benjiman Core, MD 09/12/16 0003

## 2016-09-11 NOTE — ED Provider Notes (Signed)
Kimberly [name] is a 61 y.o. female, with a history of ESRD on dialysis, presenting to the ED with generalized weakness beginning today and a cough for the last 3-4 days. Pt denies fever, but states she typically does not get a fever when she gets pneumonia. Patient states she is on 2 L a minute oxygen at home.   HPI from [place], [place]: "Kimberly ManilaSusan K Drawdy is a 61 y.o. female on dialysis M/W/F seen yesterday instead of today due to holiday, diabetes on insulin, CHF, COPD, and asthma  presents to the ED today for complaints of fatigue after today's physical therapy session and  worsening nonproductive cough for the last 3-4 days. Patient states that she had dizziness, felt like the lights were spinning. She reports being restricted on liquids due to her dialysis at the nursing home. Patient states that she thinks she acquired some type of upper respiratory infection or pneumonia from the skilled nursing facility she is at now. She states it feels like the last time she had pneumonia. She admits to associated shortness of breath. She is on 2L at home. Patient denies chest pain, nausea, vomiting."      Past Medical History:  Diagnosis Date  . Anemia   . Anxiety   . Arthritis    "knees, feet, hands" (08/23/2016)  . Asthma   . CAD, NATIVE VESSEL    May 10, 2010 cath showed a hyperdynamic LV function, she had dominant circumflex anatomy with a 70-80% small OM1. She had diffuse diabetic plaque particularly in the distal LAD. She nondominant RCA.  Nondominant  . Cellulitis 10/15/2013  . CHF (congestive heart failure) (HCC)    Preserved EF  . Chronic bronchitis (HCC)    "probably once/ yr" (08/23/2016)  . Complication of anesthesia    "I've had difficulty waking up" (08/23/2016)  . COPD   . Depression   . Diabetic neuropathy (HCC)   . Endometrial hyperplasia   . ESRD (end stage renal disease) on dialysis (HCC)    "[facility]; MWF; [facility]" (08/23/2016)  . GERD (gastroesophageal reflux  disease)   . History of hiatal hernia   . HYPERLIPIDEMIA   . HYPERTENSION   . Hypothyroidism   . Myocardial infarct, old   . On home oxygen therapy    "2L; 24/7" (08/23/2016)  . Paralyzed vocal cords   . Peripheral neuropathy (HCC)    hx/notes 01/27/2010  . Pneumonia "several times"  . Proliferative retinopathy    hx/notes 01/27/2010  . PVD    CEA  . Restless leg syndrome    "mostly on the right" (08/23/2016)  . Sleep apnea    not on cpap    . 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 Type II diabetes mellitus (HCC)      Physical Exam  BP 110/56   Pulse 97   Temp 97.9 F (36.6 C)   Resp (!) 30   Ht 5\' 4"  (1.626 m)   Wt 124.3 kg   SpO2 95%   BMI 47.03 kg/m   Physical Exam  Constitutional: She is oriented to person, place, and time. She appears well-developed and well-nourished. No distress.  HENT:  Head: Normocephalic and atraumatic.  Eyes: Conjunctivae are normal.  Neck: Neck supple.  Cardiovascular: Normal rate, regular rhythm, normal heart sounds and intact distal pulses.   Pulmonary/Chest: She has wheezes.  Increased  work of breathing. Speaks in short sentences and phrases with the need to rest in between.  Abdominal: Soft. There is no tenderness. There is no guarding.  Musculoskeletal: She exhibits no edema.  Lymphadenopathy:    She has no cervical adenopathy.  Neurological: She is alert and oriented to person, place, and time.  Skin: Skin is warm and dry. She is not diaphoretic.  Psychiatric: She has a normal mood and affect. Her behavior is normal.  Nursing note and vitals reviewed.   ED Course  Procedures   Results for orders placed or performed during the hospital encounter of 09/11/16  CBC with Differential  Result Value Ref Range   WBC 9.6 4.0 - 10.5 K/uL   RBC 3.43 (L) 3.87 - 5.11 MIL/uL   Hemoglobin 11.4 (L) 12.0 - 15.0 g/dL    HCT 16.1 09.6 - 04.5 %   MCV 107.3 (H) 78.0 - 100.0 fL   MCH 33.2 26.0 - 34.0 pg   MCHC 31.0 30.0 - 36.0 g/dL   RDW 40.9 (H) 81.1 - 91.4 %   Platelets 159 150 - 400 K/uL   Neutrophils Relative % 75 %   Neutro Abs 7.2 1.7 - 7.7 K/uL   Lymphocytes Relative 15 %   Lymphs Abs 1.5 0.7 - 4.0 K/uL   Monocytes Relative 8 %   Monocytes Absolute 0.8 0.1 - 1.0 K/uL   Eosinophils Relative 1 %   Eosinophils Absolute 0.1 0.0 - 0.7 K/uL   Basophils Relative 1 %   Basophils Absolute 0.1 0.0 - 0.1 K/uL  Basic metabolic panel  Result Value Ref Range   Sodium 133 (L) 135 - 145 mmol/L   Potassium 3.5 3.5 - 5.1 mmol/L   Chloride 97 (L) 101 - 111 mmol/L   CO2 26 22 - 32 mmol/L   Glucose, Bld 249 (H) 65 - 99 mg/dL   BUN 12 6 - 20 mg/dL   Creatinine, Ser 7.82 (H) 0.44 - 1.00 mg/dL   Calcium 9.1 8.9 - 95.6 mg/dL   GFR calc non Af Amer 11 (L) >60 mL/min   GFR calc Af Amer 13 (L) >60 mL/min   Anion gap 10 5 - 15  I-stat troponin, ED  Result Value Ref Range   Troponin i, poc 0.03 0.00 - 0.08 ng/mL   Comment 3          CBG monitoring, ED  Result Value Ref Range   Glucose-Capillary 237 (H) 65 - 99 mg/dL  I-Stat CG4 Lactic Acid, ED  Result Value Ref Range   Lactic Acid, Venous 2.82 (HH) 0.5 - 1.9 mmol/L   Comment NOTIFIED PHYSICIAN    BUN  Date Value Ref Range Status  09/11/2016 12 6 - 20 mg/dL Final  21/30/8657 22 (A) 4 - 21 mg/dL Final  84/69/6295 14 6 - 20 mg/dL Final  28/41/3244 29 (H) 6 - 20 mg/dL Final  09/13/7251 35 (H) 6 - 20 mg/dL Final   Creatinine  Date Value Ref Range Status  09/06/2016 6.1 (A) 0.5 - 1.1 mg/dL Final   Creatinine, Ser  Date Value Ref Range Status  09/11/2016 3.93 (H) 0.44 - 1.00 mg/dL Final  66/44/0347 4.25 (H) 0.44 - 1.00 mg/dL Final  95/63/8756 4.33 (H) 0.44 - 1.00 mg/dL Final  29/51/8841 6.60 (H) 0.44 - 1.00 mg/dL Final    Comment:    DELTA CHECK NOTED   Hemoglobin  Date Value Ref Range Status  09/11/2016 11.4 (L) 12.0 - 15.0 g/dL Final  63/09/6008  93.2 (  A) 12.0 - 16.0 g/dL Final  78/29/5621 30.8 (L) 12.0 - 15.0 g/dL Final  65/78/4696 9.7 (L) 12.0 - 15.0 g/dL Final    Dg Chest 2 View  Result Date: 09/11/2016 CLINICAL DATA:  Patient with generalized weakness and fatigue. EXAM: CHEST  2 VIEW COMPARISON:  Chest radiograph 08/25/2016. FINDINGS: Central venous catheter tip projects over the right atrium. Re- demonstrated cardiomegaly. Pulmonary vascular redistribution and interstitial opacities bilaterally. Additionally, there is suggestion of increased consolidation within the left mid and lower lung. No pleural effusion or pneumothorax. Thoracic spine degenerative changes. IMPRESSION: Suggestion of increased consolidation within the left mid and lower lung which may be secondary to overlapping breast tissue however pulmonary consolidation in the setting of edema or pneumonia is not excluded. Consider further evaluation with PA chest radiograph. Cardiomegaly. Electronically Signed   By: Annia Belt M.D.   On: 09/11/2016 16:08   X-ray Chest Pa And Lateral  Result Date: 08/24/2016 CLINICAL DATA:  Cough, shortness of Breath EXAM: CHEST  2 VIEW COMPARISON:  08/23/2016 FINDINGS: Cardiomegaly again noted. Limited study by patient's large body habitus. No gross infiltrate or pulmonary edema. Haziness in left midlung field most likely from overlying breast tissue. Stable double lumen left IJ catheter with tip in right atrium. IMPRESSION: Limited study by patient's large body habitus. No gross infiltrate or pulmonary edema. Haziness in left midlung field most likely from overlying breast tissue. Stable double lumen left IJ catheter with tip in right atrium. Electronically Signed   By: Natasha Mead M.D.   On: 08/24/2016 08:08   Ct Abdomen Pelvis W Contrast  Result Date: 08/23/2016 CLINICAL DATA:  The sided abdominal pain EXAM: CT ABDOMEN AND PELVIS WITH CONTRAST TECHNIQUE: Multidetector CT imaging of the abdomen and pelvis was performed using the standard  protocol following bolus administration of intravenous contrast. CONTRAST:  ISOVUE-300 IOPAMIDOL (ISOVUE-300) INJECTION 61% COMPARISON:  None. FINDINGS: Lower chest: No acute abnormality. Hepatobiliary: The liver is diffusely fatty infiltrated. The gallbladder is within normal limits. Pancreas: Unremarkable. No pancreatic ductal dilatation or surrounding inflammatory changes. Spleen: Normal in size without focal abnormality. Adrenals/Urinary Tract: Adrenal glands are unremarkable. Kidneys are normal, without renal calculi, focal lesion, or hydronephrosis. Bladder is unremarkable. Stomach/Bowel: Stomach is within normal limits. Appendix appears normal. No evidence of bowel wall thickening, distention, or inflammatory changes. Vascular/Lymphatic: Aortic atherosclerosis. No enlarged abdominal or pelvic lymph nodes. Reproductive: IUD is noted in place. No other focal abnormality is noted. Other: Some laxity of the anterior abdominal wall is noted although no true hernia is seen. No abdominopelvic ascites. Musculoskeletal: No acute or significant osseous findings. IMPRESSION: Chronic changes without acute abnormality. Electronically Signed   By: Alcide Clever M.D.   On: 08/23/2016 10:56   Dg Chest Port 1 View  Result Date: 08/25/2016 CLINICAL DATA:  Follow-up pulmonary edema EXAM: PORTABLE CHEST 1 VIEW COMPARISON:  08/24/2016 FINDINGS: Cardiac shadow is mildly prominent and stable. Left jugular dialysis catheter is again seen. Lungs are clear bilaterally. No vascular congestion or pulmonary edema is seen. No effusions are noted. IMPRESSION: No active disease. Electronically Signed   By: Alcide Clever M.D.   On: 08/25/2016 17:13   Dg Chest Portable 1 View  Result Date: 08/23/2016 CLINICAL DATA:  Abdominal pain. EXAM: PORTABLE CHEST 1 VIEW COMPARISON:  07/10/2015 . FINDINGS: Left IJ dual-lumen catheter noted with tip projected over right atrium. Cardiomegaly with bilateral pulmonary interstitial prominence  consistent with congestive heart failure. No pleural effusion or pneumothorax . IMPRESSION: 1. Left IJ  dual-lumen catheter with tip projected over the right atrium. 2. Congestive heart failure with mild pulmonary interstitial edema. Electronically Signed   By: Maisie Fus  Register   On: 08/23/2016 10:00   Dg Knee Right Port  Result Date: 08/26/2016 CLINICAL DATA:  Knee pain without trauma EXAM: PORTABLE RIGHT KNEE - 1-2 VIEW COMPARISON:  None. FINDINGS: Vascular calcifications. Degenerative changes in the patellofemoral compartment. No fracture, dislocation, or joint effusion. IMPRESSION: No acute abnormalities. Electronically Signed   By: Gerome Sam III M.D   On: 08/26/2016 18:03    EKG Interpretation  Date/Time:  Monday September 11 2016 13:06:34 EST Ventricular Rate:  98 PR Interval:    QRS Duration: 139 QT Interval:  410 QTC Calculation: 524 R Axis:   -4 Text Interpretation:  Sinus rhythm Left bundle branch block Confirmed by COOK  MD, BRIAN ([id number]) on 09/11/2016 2:48:43 PM       MDM   Patient presents with a cough and generalized weakness. Increased work of breathing on exam. Consolidation on chest x-ray. Will need admission and IV antibiotics. Treated under sepsis protocol.   6:34 PM Spoke with Dr. Antionette Char, hospitalist, who agreed to admit the patient to telemetry inpatient status.    Vitals:   09/11/16 1349 09/11/16 1400 09/11/16 1516 09/11/16 1530  BP: (!) 86/39 92/66 (!) 105/51 110/56  Pulse: 95 102 98 97  Resp: 25 24 26  (!) 30  Temp:      SpO2: 96% 94% 97% 95%  Weight:      Height:       Vitals:   09/11/16 1830 09/11/16 1857 09/11/16 1858 09/11/16 1900  BP: (!) 103/50 (!) 89/49 (!) 94/47 (!) 94/51  Pulse:  96 95 93  Resp:  21 10 17   Temp:  100.6 F (38.1 C)    TempSrc:  Rectal    SpO2:  95% 96% 97%  Weight:      Height:            Anselm Pancoast, PA-C 09/11/16 1947    Benjiman Core, MD 09/12/16 0007

## 2016-09-11 NOTE — ED Notes (Signed)
Second set of blood cultures were completed at 1831, not 1631.

## 2016-09-11 NOTE — Progress Notes (Addendum)
Pharmacy Antibiotic Note  Kimberly [name] is a 61 y.o. female admitted on 09/11/2016 with pneumonia.  Pharmacy has been consulted for vancomycin dosing. Patient receiving cefepime 1g IV x1 in ED. Patient has ESRD receiving HD MWF. Last HD was Sunday. No vancomycin was received in HD.   Plan: Vancomycin 2000mg  IV once now then follow-up HD schedule for further dosing.  Follow-up if Cefepime should be continued.   Height: 5\' 4"  (162.6 cm) Weight: 274 lb (124.3 kg) IBW/kg (Calculated) : 54.7  Temp (24hrs), Avg:97.9 F (36.6 C), Min:97.9 F (36.6 C), Max:97.9 F (36.6 C)   Recent Labs Lab 09/06/16 09/11/16 1415  WBC 12.9 9.6  CREATININE 6.1* 3.93*    Estimated Creatinine Clearance: 19.6 mL/min (by C-G formula based on SCr of 3.93 mg/dL (H)).    Allergies  Allergen Reactions  . Ciprofloxacin Itching  . Epinephrine     Increased heart rate    Antimicrobials this admission: Cefepime 1/1 x1 Vancomycin 1/1 >>  Dose adjustments this admission: n/a  Microbiology results: 1/1 BCx:  Thank you for allowing pharmacy to be a part of this patient's care.  Link SnufferJessica Trell Secrist, PharmD, BCPS Clinical Pharmacist [phone] 09/11/2016 5:30 PM

## 2016-09-12 ENCOUNTER — Encounter (HOSPITAL_COMMUNITY): Payer: Self-pay | Admitting: Family Medicine

## 2016-09-12 DIAGNOSIS — E119 Type 2 diabetes mellitus without complications: Secondary | ICD-10-CM

## 2016-09-12 DIAGNOSIS — Z794 Long term (current) use of insulin: Secondary | ICD-10-CM

## 2016-09-12 LAB — RESPIRATORY PANEL BY PCR
ADENOVIRUS-RVPPCR: NOT DETECTED
Bordetella pertussis: NOT DETECTED
CHLAMYDOPHILA PNEUMONIAE-RVPPCR: NOT DETECTED
CORONAVIRUS NL63-RVPPCR: NOT DETECTED
CORONAVIRUS OC43-RVPPCR: NOT DETECTED
Coronavirus 229E: NOT DETECTED
Coronavirus HKU1: NOT DETECTED
INFLUENZA A H3-RVPPCR: DETECTED — AB
Influenza B: NOT DETECTED
MYCOPLASMA PNEUMONIAE-RVPPCR: NOT DETECTED
Metapneumovirus: NOT DETECTED
PARAINFLUENZA VIRUS 1-RVPPCR: NOT DETECTED
Parainfluenza Virus 2: NOT DETECTED
Parainfluenza Virus 3: NOT DETECTED
Parainfluenza Virus 4: NOT DETECTED
Respiratory Syncytial Virus: NOT DETECTED
Rhinovirus / Enterovirus: NOT DETECTED

## 2016-09-12 LAB — BASIC METABOLIC PANEL
ANION GAP: 16 — AB (ref 5–15)
BUN: 16 mg/dL (ref 6–20)
CALCIUM: 9.1 mg/dL (ref 8.9–10.3)
CO2: 22 mmol/L (ref 22–32)
Chloride: 95 mmol/L — ABNORMAL LOW (ref 101–111)
Creatinine, Ser: 4.74 mg/dL — ABNORMAL HIGH (ref 0.44–1.00)
GFR, EST AFRICAN AMERICAN: 11 mL/min — AB (ref >60)
GFR, EST NON AFRICAN AMERICAN: 9 mL/min — AB (ref >60)
GLUCOSE: 325 mg/dL — AB (ref 65–99)
POTASSIUM: 3.9 mmol/L (ref 3.5–5.1)
Sodium: 133 mmol/L — ABNORMAL LOW (ref 135–145)

## 2016-09-12 LAB — GLUCOSE, CAPILLARY
GLUCOSE-CAPILLARY: 316 mg/dL — AB (ref 65–99)
GLUCOSE-CAPILLARY: 329 mg/dL — AB (ref 65–99)
GLUCOSE-CAPILLARY: 234 mg/dL — AB (ref 65–99)
Glucose-Capillary: 192 mg/dL — ABNORMAL HIGH (ref 65–99)

## 2016-09-12 LAB — MRSA PCR SCREENING: MRSA BY PCR: POSITIVE — AB

## 2016-09-12 LAB — LACTIC ACID, PLASMA
Lactic Acid, Venous: 2.2 mmol/L (ref 0.5–1.9)
Lactic Acid, Venous: 2.3 mmol/L (ref 0.5–1.9)

## 2016-09-12 LAB — HIV ANTIBODY (ROUTINE TESTING W REFLEX): HIV SCREEN 4TH GENERATION: NONREACTIVE

## 2016-09-12 MED ORDER — SODIUM CHLORIDE 0.9 % IV BOLUS (SEPSIS)
500.0000 mL | Freq: Once | INTRAVENOUS | Status: AC
  Administered 2016-09-12: 500 mL via INTRAVENOUS

## 2016-09-12 MED ORDER — HYDROCERIN EX CREA
TOPICAL_CREAM | Freq: Two times a day (BID) | CUTANEOUS | Status: DC
  Administered 2016-09-12 – 2016-09-14 (×5): via TOPICAL
  Filled 2016-09-12: qty 113

## 2016-09-12 MED ORDER — MUPIROCIN 2 % EX OINT
1.0000 "application " | TOPICAL_OINTMENT | Freq: Two times a day (BID) | CUTANEOUS | Status: DC
  Administered 2016-09-12 – 2016-09-14 (×5): 1 via NASAL
  Filled 2016-09-12 (×2): qty 22

## 2016-09-12 MED ORDER — CHLORHEXIDINE GLUCONATE CLOTH 2 % EX PADS
6.0000 | MEDICATED_PAD | Freq: Every day | CUTANEOUS | Status: DC
  Administered 2016-09-12 – 2016-09-14 (×3): 6 via TOPICAL

## 2016-09-12 MED ORDER — OSELTAMIVIR PHOSPHATE 30 MG PO CAPS
30.0000 mg | ORAL_CAPSULE | Freq: Every day | ORAL | Status: AC
  Administered 2016-09-12: 30 mg via ORAL
  Filled 2016-09-12: qty 1

## 2016-09-12 MED ORDER — COLLAGENASE 250 UNIT/GM EX OINT
TOPICAL_OINTMENT | Freq: Every day | CUTANEOUS | Status: DC
  Administered 2016-09-13 – 2016-09-14 (×2): via TOPICAL
  Filled 2016-09-12: qty 30

## 2016-09-12 MED ORDER — OSELTAMIVIR PHOSPHATE 30 MG PO CAPS
30.0000 mg | ORAL_CAPSULE | Freq: Every day | ORAL | Status: DC
Start: 2016-09-12 — End: 2016-09-12
  Filled 2016-09-12: qty 1

## 2016-09-12 MED ORDER — DOXERCALCIFEROL 4 MCG/2ML IV SOLN
3.0000 ug | INTRAVENOUS | Status: DC
  Administered 2016-09-13: 3 ug via INTRAVENOUS
  Filled 2016-09-12: qty 2

## 2016-09-12 MED ORDER — OSELTAMIVIR PHOSPHATE 30 MG PO CAPS
30.0000 mg | ORAL_CAPSULE | ORAL | Status: AC
  Administered 2016-09-12 – 2016-09-13 (×2): 30 mg via ORAL
  Filled 2016-09-12: qty 1

## 2016-09-12 MED ORDER — VANCOMYCIN HCL IN DEXTROSE 1-5 GM/200ML-% IV SOLN
1000.0000 mg | INTRAVENOUS | Status: DC
  Filled 2016-09-12: qty 200

## 2016-09-12 NOTE — Consult Note (Signed)
WOC Nurse wound consult note Reason for Consult: heel and sacrum [facility] [place]  Palpable pulses bilaterally, venous dermatitis evident.  Reports she does not use any compression stockings routinely  Wound type: Unstageable pressure injury left lateral heel Stage 2 pressure injury upper sacral/gluteal fold Pressure Ulcer POA: Yes Measurement: 3cm x 1cm x 0.1cm left heel; 1.0cm x 1.0cm x 0.1cm upper gluteal fold Wound bed: sacrum: clean, partial thickness skin lost; left heel: 100% soft black eschar, flucuant Drainage (amount, consistency, odor)none from each site  Periwound: intact, however patient has severe venous dermatitis Dressing procedure/placement/frequency:  Continue soft silicone foam for the sacral/glutel fold Add enzymatic debridement daily to the left heel since it is slightly open and fluctuant  Add Prevalon boots for offloading heels  Add Eucerin for chronic venous dermatitis.  Discussed POC with patient and bedside nurse.  Re consult if needed, will not follow at this time. Thanks  Jdyn Parkerson M.D.C. [employer] MSN, RN,CWOCN, CNS [phone])

## 2016-09-12 NOTE — Progress Notes (Addendum)
PROGRESS NOTE  Kimberly [name] WUJ:[id number] DOB: June 08, 1956 DOA: 09/11/2016 PCP: Ginette OttoSTONEKING,[name] THOMAS, MD  Brief Summary:  From nursing home with cough, dyspnea, treat for HCAP, +flu. Borderline bp, received gentle hydration ESRD , nephrology notified.  Chronic wound, wound care consulted as well   HPI/Recap of past 24 hours:  Slightly confused, "how did I get here"   Assessment/Plan: Principal Problem:   HCAP (healthcare-associated pneumonia) Active Problems:   DM (diabetes mellitus), type 2 with renal complications (HCC)   CAD (coronary artery disease), native coronary artery   COPD (chronic obstructive pulmonary disease) (HCC)   Chronic diastolic CHF (congestive heart failure) (HCC)   Chronic respiratory failure with hypoxia (HCC)   Hypothyroidism   ESRD (end stage renal disease) (HCC)   Hypotension arterial   Hyponatremia   Diabetes mellitus, type II, insulin dependent (HCC)  Cough: cxr ? Left mid and lower lung infiltrate vs overlapping breast tissue  Respiratory panel +influenza a She is started on tamiflu renal dosing She is continued on vanc/cefepime that was started from the ED, treat for possible HCAP mrsa screen positive, sputum culture not collected. Blood culture pending  Chronic wound, sacral and left heal, wound care consulted. She is already on abx   ESRD  - Pt is typically dialyzed MWF, but had HD on 09/10/16 according to holiday schedule - No indications for urgent HD on admission  - She is dry on admission in setting of acute infection and was given 250 cc NS in ED; BP is soft and she is given another 250 cc on admission   [place]  Chronic diastolic CHF  - TTE (07/09/15) with EF 55-60%, mild concentric hypertrophy, grade 2 diastolic dysfunction, mild MS, and severe pulmonary HTN  - She appears dry on admission and has received 500 cc NS  - Following strict I/Os, fluid-restricting diet    COPD, chronic hypoxic  respiratory failure  on home o2 2liter,  - There are rhonchi over left lung zones, but no appreciable wheezing or obstructive breathing after receiving DuoNeb in ED  - Continue prn nebs and abx.   Insulin dependent diabetes Recent a1c 9.9 On lantus and ssi Adjust insulin as needed.  morbid obesity: Body mass index is 44.73 kg/m.   Code Status: DNR  Family Communication: patient   Disposition Plan: return to SNF once medically stable   Consultants:  nephrology  Procedures:  HD  Antibiotics:  Vanc/cefepime from admission   Objective: BP 90/60 (BP Location: Right Arm)   Pulse 98   Temp 98.6 F (37 C) (Oral)   Resp 18   Ht 5\' 4"  (1.626 m)   Wt 118.2 kg (260 lb 9.3 oz)   SpO2 100%   BMI 44.73 kg/m   Intake/Output Summary (Last 24 hours) at 09/12/16 1658 Last data filed at 09/12/16 1113  Gross per 24 hour  Intake              620 ml  Output                0 ml  Net              620 ml   Filed Weights   09/11/16 1304 09/11/16 2036  Weight: 124.3 kg (274 lb) 118.2 kg (260 lb 9.3 oz)    Exam:   General:  Slight confused, drowsy, but able to self correct, obese  Cardiovascular: RRR  Respiratory: diminished, some rhonchi left lung fields, no significant wheezing  Abdomen: Soft/ND/NT, positive BS  Musculoskeletal: No Edema, chronic venous stasis changes, skin wrinkled  Neuro:  Slight confused, drowsy, but able to self correct,   Skin: chronic sacral and left heal wound  Data Reviewed: Basic Metabolic Panel:  Recent Labs Lab 09/06/16 09/11/16 1415 09/12/16 0004  NA 133* 133* 133*  K 3.9 3.5 3.9  CL  --  97* 95*  CO2  --  26 22  GLUCOSE  --  249* 325*  BUN 22* 12 16  CREATININE 6.1* 3.93* 4.74*  CALCIUM  --  9.1 9.1   Liver Function Tests: No results for input(s): AST, ALT, ALKPHOS, BILITOT, PROT, ALBUMIN in the last 168 hours. No results for input(s): LIPASE, AMYLASE in the last 168 hours. No results for input(s): AMMONIA in the last  168 hours. CBC:  Recent Labs Lab 09/06/16 09/11/16 1415  WBC 12.9 9.6  NEUTROABS 11 7.2  HGB 10.5* 11.4*  HCT 34* 36.8  MCV  --  107.3*  PLT 189 159   Cardiac Enzymes:   No results for input(s): CKTOTAL, CKMB, CKMBINDEX, TROPONINI in the last 168 hours. BNP (last 3 results) No results for input(s): BNP in the last 8760 hours.  ProBNP (last 3 results) No results for input(s): PROBNP in the last 8760 hours.  CBG:  Recent Labs Lab 09/11/16 1649 09/11/16 2026 09/12/16 0745 09/12/16 1221  GLUCAP 237* 239* 316* 329*    Recent Results (from the past 240 hour(s))  Culture, blood (routine x 2)     Status: None (Preliminary result)   Collection Time: 09/11/16  4:31 PM  Result Value Ref Range Status   Specimen Description BLOOD LEFT HAND  Final   Special Requests BOTTLES DRAWN AEROBIC AND ANAEROBIC 5CC  Final   Culture NO GROWTH < 24 HOURS  Final   Report Status PENDING  Incomplete  Culture, blood (routine x 2)     Status: None (Preliminary result)   Collection Time: 09/11/16  6:01 PM  Result Value Ref Range Status   Specimen Description BLOOD RIGHT HAND  Final   Special Requests BOTTLES DRAWN AEROBIC AND ANAEROBIC 3CC  Final   Culture NO GROWTH < 24 HOURS  Final   Report Status PENDING  Incomplete  Respiratory Panel by PCR     Status: Abnormal   Collection Time: 09/11/16  7:46 PM  Result Value Ref Range Status   Adenovirus NOT DETECTED NOT DETECTED Final   Coronavirus 229E NOT DETECTED NOT DETECTED Final   Coronavirus HKU1 NOT DETECTED NOT DETECTED Final   Coronavirus NL63 NOT DETECTED NOT DETECTED Final   Coronavirus OC43 NOT DETECTED NOT DETECTED Final   Metapneumovirus NOT DETECTED NOT DETECTED Final   Rhinovirus / Enterovirus NOT DETECTED NOT DETECTED Final   Influenza A H3 DETECTED (A) NOT DETECTED Final   Influenza B NOT DETECTED NOT DETECTED Final   Parainfluenza Virus 1 NOT DETECTED NOT DETECTED Final   Parainfluenza Virus 2 NOT DETECTED NOT DETECTED Final    Parainfluenza Virus 3 NOT DETECTED NOT DETECTED Final   Parainfluenza Virus 4 NOT DETECTED NOT DETECTED Final   Respiratory Syncytial Virus NOT DETECTED NOT DETECTED Final   Bordetella pertussis NOT DETECTED NOT DETECTED Final   Chlamydophila pneumoniae NOT DETECTED NOT DETECTED Final   Mycoplasma pneumoniae NOT DETECTED NOT DETECTED Final  MRSA PCR Screening     Status: Abnormal   Collection Time: 09/12/16  3:42 AM  Result Value Ref Range Status   MRSA by PCR POSITIVE (A) NEGATIVE Final  Comment:        The GeneXpert MRSA Assay (FDA approved for NASAL specimens only), is one component of a comprehensive MRSA colonization surveillance program. It is not intended to diagnose MRSA infection nor to guide or monitor treatment for MRSA infections. RESULT CALLED TO, READ BACK BY AND VERIFIED WITH: Sarita Haver RN 9:40 09/12/16 ([email or web address])      Studies: No results found.  Scheduled Meds: . aspirin EC  81 mg Oral Daily  . ceFEPime (MAXIPIME) IV  1 g Intravenous Q24H  . Chlorhexidine Gluconate Cloth  6 each Topical Q0600  . collagenase   Topical Daily  . feeding supplement (PRO-STAT SUGAR FREE 64)  30 mL Oral Daily  . heparin  5,000 Units Subcutaneous Q8H  . hydrocerin   Topical BID  . insulin aspart  0-15 Units Subcutaneous TID WC  . insulin aspart  0-5 Units Subcutaneous QHS  . insulin aspart  4 Units Subcutaneous TID WC  . insulin glargine  40 Units Subcutaneous BID  . levothyroxine  75 mcg Oral QAC breakfast  . midodrine  10 mg Oral BID WC  . multivitamin  1 tablet Oral Daily  . mupirocin ointment  1 application Nasal BID  . nystatin   Topical TID  . oseltamivir  30 mg Oral q1800  . sevelamer carbonate  2.4 g Oral TID WC  . sodium chloride flush  3 mL Intravenous Q12H  . [START ON 09/13/2016] vancomycin  1,000 mg Intravenous Q M,W,F-HD    Continuous Infusions:   Time spent:  Jayland Null MD, PhD  [facility] Pager [phone]. If 7PM-7AM, please contact  night-coverage at [email or web address], password [facility] 09/12/2016, 4:58 PM  LOS: 1 day

## 2016-09-12 NOTE — Consult Note (Signed)
[facility] Renal Consultation Note    Indication for Consultation:  Management of ESRD/hemodialysis; anemia, hypertension/volume and secondary hyperparathyroidism Primary Neph: [facility])  HPI: Kimberly [name] is a 61 y.o. female with ESRD 2/2 to cardiorenal syndrome/DM on HD at [facility]. PMH significant for DM, diabetic neuropathy, morbid obesity, chronic diastolic HF, COPD, pulmonary hypertension, proliferative retinopathy, chronic edema/cellulitis BLE, CAD PAF was on Eliquis, now stopped D/T bleeding form endometrial hyperplasia, endometrial hyperplasia treated with Mirena IUD, COPD (former smoker) chronic hypoxic respiratory failure on home O2, hypothyroidism, anemia of chronic disease, SHPT. Very HOH. L ear worse than R ear.   Patient reports that she has been in SNF for rehab, has not been eating well. Says she was in therapy Monday, felt as though her blood pressure was low and that she was going to pass out. She denies fever, chills, C/O cough with clear phlegm, chronic diarrhea, chronic SOB. She was brought to [facility] 09/11/2016 for evaluation. Temperature on arrival to ED was 97.9 HR 99 RR 25 BP 98/49. Temperature later went up to 100.6. WBC 9.6 HGB 11.4 Na 133 K+ 3.9 Co2 22 Scr 4.74 BUN 16 BS 325 AG 16 Lactic acid 2.8. BC were done in ED, she rec'd IVF bolus, was started on Vanc/cefepime per primary. Respiratory panel positive for influenza A. MSRA PCR positive.   Patient's last HD was 09/10/16. She ran full treatment, arrived 5 kg under EDW and left 5.8 under OP EDW. EDW was lowered to 119 kg 09/10/16.  Patient has struggled in past with high IDWG but has improved remarkably. She has recently started taking renvela powder (had not been taking binders D/T financial issues) and phos has now improved. She refuses permanent AV access.   Past Medical History:  Diagnosis Date  . Anemia   . Anxiety   . Arthritis    "knees, feet, hands" (08/23/2016)  .  Asthma   . CAD, NATIVE VESSEL    May 10, 2010 cath showed a hyperdynamic LV function, she had dominant circumflex anatomy with a 70-80% small OM1. She had diffuse diabetic plaque particularly in the distal LAD. She nondominant RCA.  Nondominant  . Cellulitis 10/15/2013  . CHF (congestive heart failure) (HCC)    Preserved EF  . Chronic bronchitis (HCC)    "probably once/ yr" (08/23/2016)  . Complication of anesthesia    "I've had difficulty waking up" (08/23/2016)  . COPD   . Depression   . Diabetic neuropathy (HCC)   . Endometrial hyperplasia   . ESRD (end stage renal disease) on dialysis (HCC)    "[facility]; MWF; [facility]" (08/23/2016)  . GERD (gastroesophageal reflux disease)   . History of hiatal hernia   . HYPERLIPIDEMIA   . HYPERTENSION   . Hypothyroidism   . Myocardial infarct, old   . On home oxygen therapy    "2L; 24/7" (08/23/2016)  . Paralyzed vocal cords   . Peripheral neuropathy (HCC)    hx/notes 01/27/2010  . Pneumonia "several times"  . Proliferative retinopathy    hx/notes 01/27/2010  . PVD    CEA  . Restless leg syndrome    "mostly on the right" (08/23/2016)  . Sleep apnea    not on cpap    . 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 Type II diabetes mellitus (HCC)    Past Surgical History:  Procedure Laterality Date  . AV FISTULA PLACEMENT Right 07/08/2015   Procedure: EXPLORATION RIGHT AXILLARY ARTERY AND RIGHT BRACHIAL VEIN;  Surgeon: Chuck Hint, MD;  Location: [facility] OR;  Service: Vascular;  Laterality: Right;  . CAROTID ENDARTERECTOMY Left X 2  . CATARACT EXTRACTION W/ INTRAOCULAR LENS  IMPLANT, BILATERAL Bilateral   . CERVICAL BIOPSY  ~ 2015   "precancerous cells"  . COLONOSCOPY    . EYE SURGERY Bilateral    numerous surgeries  . INSERTION OF DIALYSIS CATHETER N/A 06/24/2015   Procedure: ULTRASOUND BILATERAL INTERNAL  JUGULAR VEIN INSERTION OF DIALYSIS CATHETER LEFT INTERNAL JUGULAR VEIN ;  Surgeon: Pryor Ochoa, MD;  Location: [facility] OR;  Service: Vascular;  Laterality: N/A;  . INTRAUTERINE DEVICE INSERTION  ~ 2015  . VITRECTOMY Bilateral    Family History  Problem Relation Age of Onset  . Cancer Mother     Breast, NHL  . Stroke Mother   . Peripheral vascular disease Father   . CAD Father 17  . Heart attack Father   . Hypertension Father   . Asthma Father   . Heart disease Father     before age 50   Social History:  reports that she quit smoking about 11 years ago. Her smoking use included Cigarettes. She has a 20.00 pack-year smoking history. She has never used smokeless tobacco. She reports that she does not drink alcohol or use drugs. Allergies  Allergen Reactions  . Ciprofloxacin Itching  . Epinephrine     Increased heart rate   Prior to Admission medications   Medication Sig Start Date End Date Taking? Authorizing Provider  Amino Acids-Protein Hydrolys (FEEDING SUPPLEMENT, PRO-STAT SUGAR FREE 64,) LIQD Take 30 mLs by mouth daily.   Yes Historical Provider, MD  aspirin EC 81 MG tablet Take 81 mg by mouth daily.   Yes Historical Provider, MD  guaiFENesin-dextromethorphan (ROBITUSSIN DM) 100-10 MG/5ML syrup Take 10 mLs by mouth every 6 (six) hours as needed for cough.   Yes Historical Provider, MD  insulin glargine (LANTUS) 100 UNIT/ML injection Inject 0.4 mLs (40 Units total) into the skin 2 (two) times daily. 08/31/16  Yes Dorothea Ogle, MD  insulin lispro (HUMALOG) 100 UNIT/ML injection Inject 6 Units into the skin 3 (three) times daily before meals.   Yes Historical Provider, MD  ipratropium-albuterol (DUONEB) 0.5-2.5 (3) MG/3ML SOLN Take 3 mLs by nebulization every 8 (eight) hours as needed.   Yes Historical Provider, MD  levothyroxine (SYNTHROID, LEVOTHROID) 75 MCG tablet Take 75 mcg by mouth daily before breakfast.   Yes Historical Provider, MD  midodrine (PROAMATINE) 10 MG tablet Take 1  tablet (10 mg total) by mouth 2 (two) times daily with a meal. 07/20/15  Yes Nishant Dhungel, MD  multivitamin (RENA-VIT) TABS tablet Take 1 tablet by mouth daily.   Yes Historical Provider, MD  nystatin (MYCOSTATIN/NYSTOP) powder Apply topically 3 (three) times daily. 08/31/16  Yes Dorothea Ogle, MD  sevelamer carbonate (RENVELA) 2.4 g PACK Take 2.4 g by mouth 3 (three) times daily with meals. 08/31/16  Yes Dorothea Ogle, MD  acetaminophen (TYLENOL) 325 MG tablet Take 650 mg by mouth every 6 (six) hours as needed for mild pain.    Historical Provider, MD  docusate sodium (COLACE) 100 MG capsule Take 100 mg by mouth daily as needed for mild constipation.    Historical Provider, MD  hydrocerin (EUCERIN) CREA Apply 1 application topically 2 (two) times daily. 07/20/15   Eddie North, MD  levonorgestrel (MIRENA) 20 MCG/24HR IUD 1 Intra Uterine Device (1 each total) by Intrauterine route once. 08/16/15   Adolphus BirchwoodEmma Rossi, MD  OXYGEN 2lpm 24/7- [facility]    Historical Provider, MD  sodium chloride (OCEAN) 0.65 % SOLN nasal spray Place 1 spray into both nostrils as needed for congestion.    Historical Provider, MD   Current Facility-Administered Medications  Medication Dose Route Frequency Provider Last Rate Last Dose  . 0.9 %  sodium chloride infusion  250 mL Intravenous PRN Briscoe Deutscherimothy S Opyd, MD      . acetaminophen (TYLENOL) tablet 650 mg  650 mg Oral Q6H PRN Briscoe Deutscherimothy S Opyd, MD   650 mg at 09/11/16 2055  . aspirin EC tablet 81 mg  81 mg Oral Daily Briscoe Deutscherimothy S Opyd, MD   81 mg at 09/12/16 1020  . ceFEPIme (MAXIPIME) 1 g in dextrose 5 % 50 mL IVPB  1 g Intravenous Q24H Lavone Neriimothy S Opyd, MD      . Chlorhexidine Gluconate Cloth 2 % PADS 6 each  6 each Topical Q0600 Albertine GratesFang Xu, MD   6 each at 09/12/16 1210  . collagenase (SANTYL) ointment   Topical Daily Albertine GratesFang Xu, MD      . docusate sodium (COLACE) capsule 100 mg  100 mg Oral Daily PRN Briscoe Deutscherimothy S Opyd, MD      . Melene Muller[START ON 09/13/2016] doxercalciferol (HECTOROL) injection 3 mcg  3  mcg Intravenous Q M,W,F-HD Pola Cornita Harbison Brown, NP      . feeding supplement (PRO-STAT SUGAR FREE 64) liquid 30 mL  30 mL Oral Daily Lavone Neriimothy S Opyd, MD   30 mL at 09/12/16 1020  . guaiFENesin-dextromethorphan (ROBITUSSIN DM) 100-10 MG/5ML syrup 10 mL  10 mL Oral Q6H PRN Briscoe Deutscherimothy S Opyd, MD   10 mL at 09/11/16 2054  . heparin injection 5,000 Units  5,000 Units Subcutaneous Q8H Briscoe Deutscherimothy S Opyd, MD   5,000 Units at 09/12/16 1430  . hydrocerin (EUCERIN) cream   Topical BID Albertine GratesFang Xu, MD      . insulin aspart (novoLOG) injection 0-15 Units  0-15 Units Subcutaneous TID WC Briscoe Deutscherimothy S Opyd, MD   5 Units at 09/12/16 1748  . insulin aspart (novoLOG) injection 0-5 Units  0-5 Units Subcutaneous QHS Briscoe Deutscherimothy S Opyd, MD   2 Units at 09/11/16 2055  . insulin aspart (novoLOG) injection 4 Units  4 Units Subcutaneous TID WC Briscoe Deutscherimothy S Opyd, MD   4 Units at 09/12/16 1748  . insulin glargine (LANTUS) injection 40 Units  40 Units Subcutaneous BID Briscoe Deutscherimothy S Opyd, MD   40 Units at 09/12/16 1021  . ipratropium-albuterol (DUONEB) 0.5-2.5 (3) MG/3ML nebulizer solution 3 mL  3 mL Nebulization Q4H PRN Lavone Neriimothy S Opyd, MD      . levothyroxine (SYNTHROID, LEVOTHROID) tablet 75 mcg  75 mcg Oral QAC breakfast Briscoe Deutscherimothy S Opyd, MD   75 mcg at 09/12/16 0802  . midodrine (PROAMATINE) tablet 10 mg  10 mg Oral BID WC Briscoe Deutscherimothy S Opyd, MD   10 mg at 09/12/16 1813  . multivitamin (RENA-VIT) tablet 1 tablet  1 tablet Oral Daily Briscoe Deutscherimothy S Opyd, MD   1 tablet at 09/12/16 1021  . mupirocin ointment (BACTROBAN) 2 % 1 application  1 application Nasal BID Albertine GratesFang Xu, MD   1 application at 09/12/16 1303  . nystatin (MYCOSTATIN/NYSTOP) topical powder   Topical TID Briscoe Deutscherimothy S Opyd, MD      . Melene Muller[START ON 09/13/2016] oseltamivir (TAMIFLU) capsule 30 mg  30 mg Oral Q M,W,F-2000  Pola Corn, NP      . sevelamer carbonate (RENVELA) powder PACK 2.4 g  2.4 g Oral TID WC Briscoe Deutscher, MD   2.4 g at 09/12/16 0802  . sodium chloride (OCEAN) 0.65 % nasal spray 1 spray   1 spray Each Nare PRN Lavone Neri Opyd, MD      . sodium chloride flush (NS) 0.9 % injection 3 mL  3 mL Intravenous Q12H Lavone Neri Opyd, MD   3 mL at 09/12/16 1022  . sodium chloride flush (NS) 0.9 % injection 3 mL  3 mL Intravenous PRN Briscoe Deutscher, MD      . Melene Muller ON 09/13/2016] vancomycin (VANCOCIN) IVPB 1000 mg/200 mL premix  1,000 mg Intravenous Q M,W,F-HD Albertine Grates, MD       ROS: As per HPI otherwise negative.  Physical Exam: Vitals:   09/11/16 1900 09/11/16 1945 09/11/16 2036 09/12/16 0959  BP: (!) 94/51  (!) 84/65 90/60  Pulse: 93  98 98  Resp: 17  19 18   Temp:   98.4 F (36.9 C) 98.6 F (37 C)  TempSrc:   Oral Oral  SpO2: 97% 97% 100% 100%  Weight:   118.2 kg (260 lb 9.3 oz)   Height:       BP 90/60 (BP Location: Right Arm)   Pulse 98   Temp 98.6 F (37 C) (Oral)   Resp 18   Ht 5\' 4"  (1.626 m)   Wt 118.2 kg (260 lb 9.3 oz)   SpO2 100%   BMI 44.73 kg/m    General: Chronically ill appearing female in NAD. Very HOH. . Head: Normocephalic, atraumatic, sclera non-icteric, mucus membranes are dry, cracking.  Neck: Supple. Very short neck but no JVD detected. Lungs: Bilateral breath sounds decreased in bases with few scattered bibasilar crackles, few scattered upper airway coarse rhonchi. No wheezing. No WOB. Dry cough, non-productive.  Heart: HS distant. RRR with S1 S2. No M/R/G Abdomen: Soft, non-tender, non-distended with normoactive bowel sounds. No rebound/guarding. No obvious abdominal masses. M-S:  Doesn't ambulate-uses WC. Strength equal UE.  Lower extremities: No LE edema. Chronic woody appearing chronic stasis dermatitis BLE.  Neuro: Alert and oriented X 3. Moves all extremities spontaneously. Psych:  Responds to questions appropriately with a normal affect. Pleasant and cooperative.  Dialysis Access: [facility].   Labs:  Recent Labs Lab 09/06/16 09/11/16 1415 09/12/16 0004  NA 133* 133* 133*  K 3.9 3.5 3.9  CL  --  97* 95*  CO2  --  26 22  GLUCOSE   --  249* 325*  BUN 22* 12 16  CREATININE 6.1* 3.93* 4.74*  CALCIUM  --  9.1 9.1    Recent Labs Lab 09/06/16 09/11/16 1415  WBC 12.9 9.6  NEUTROABS 11 7.2  HGB 10.5* 11.4*  HCT 34* 36.8  MCV  --  107.3*  PLT 189 159    Recent Labs Lab 09/11/16 1649 09/11/16 2026 09/12/16 0745 09/12/16 1221 09/12/16 1718  GLUCAP 237* 239* 316* 329* 234*   09/11/16 Influenza A H3 DETECTED     Studies/Results: Dg Chest 2 View  Result Date: 09/11/2016 CLINICAL DATA:  Patient with generalized weakness and fatigue. EXAM: CHEST  2 VIEW COMPARISON:  Chest radiograph 08/25/2016. FINDINGS: Central venous catheter tip projects over the right atrium. Re- demonstrated cardiomegaly. Pulmonary vascular redistribution and interstitial opacities bilaterally. Additionally, there is suggestion of increased consolidation within the left mid and lower lung. No pleural effusion or pneumothorax. Thoracic  spine degenerative changes. IMPRESSION: Suggestion of increased consolidation within the left mid and lower lung which may be secondary to overlapping breast tissue however pulmonary consolidation in the setting of edema or pneumonia is not excluded. Consider further evaluation with PA chest radiograph. Cardiomegaly. Electronically Signed   By: Annia Belt M.D.   On: 09/11/2016 16:08   Dialysis Orders:  [facility] MWF 4.5 hours 400/Auto 1.5  119 kg 2.0 K/2.25 Ca UF Profile 2 Linear Na Heparin 6000 units IV initial bolus q tx Heparin 3000 units IV mid run Mircera 50 mg IV q 4 weeks (last dose 07/26/2016 Last HGB 10.5 09/06/16)  BMD Meds:  Renvela 2.4 grams PO TID w/meals (Last Phos 5.8 Ca 9.1 C Ca 9.9 09/06/16) Hectoral 3 mcg IV q tx (last PTH 357 09/06/16)  Assessment/Plan: 1.  HCAP:  CXR suggestive of consolidation within the left mid and lower lobes. On Vanc/cefepime per primary. BC NG 24 hours, WBC 9.6 Tmax 100.6.  2.  Positive  influenza A: On droplet precautions: Started on Oseltamivir per primary, now renal  dose adjusted.  3.  ESRD -  MWF. HD tomorrow on schedule K+3.9.  4.  Hypertension/volume  - On midodrine 10 mg PO BID as OP and 10 mg PO prior to HD. Last left HD under 4-5.8 kg under EDW last 2 treatments. Says she has not been eating food at SNF. EDW lowered to 119 kg 09/10/16. Wt documented here varies widely. Recheck Wt prior to HD.  5.  Anemia  - HGB 11.4. No ESA needed. Follow HGB  6.  Metabolic bone disease - Ca 9.1 Resume Ca acetate binders while in hospital (also most inexpensive), cont. Hectorol 7. Nutrition -Renal/Carb mod diet. Renal vit/Nepro. 8. DM - primary svce 9. H/o chronic dCHF - careful of vol adm 10. HypoT4 - on replacement 11. OSA 12. MRSA PCR +  Rita H. Manson Passey, NP-C 09/12/2016, 5:18 PM  [employer] [phone]  I have seen and examined this patient and agree with plan and assessment in the above note with renal recommendations/intervention highlighted. Admitted for PNA/Flu A. Due for HD in AM. Reevaluating EDW.  Neisha Hinger B,MD 09/12/2016 7:01 PM

## 2016-09-12 NOTE — Progress Notes (Signed)
Patient educated on left foot ulcer and sacral breakdown. Patient educated to turn herself q2, staff with assist with movement, encouraged movement of left foot, encouraged patient to keep pressure off heel. Bilateral heel protectors applied, heels off bed with pillow underneath. MD notified of two wounds on sacrum and heel.

## 2016-09-12 NOTE — Progress Notes (Signed)
CRITICAL VALUE ALERT  Critical value received:  Lactic acid level 2.2  Date of notification:  09/12/16  Time of notification:  0958am  Critical value read back:Yes.    Nurse who received alert:  Haig ProphetKelly Jolina Symonds, RN  MD notified (1st page):  Dr. Janeece FittingF. Xu  Time of first page:  1000am

## 2016-09-12 NOTE — Progress Notes (Signed)
No sputum collected due to patient unable to produce a sample at this time.

## 2016-09-13 LAB — CBC
HCT: 34.9 % — ABNORMAL LOW (ref 36.0–46.0)
Hemoglobin: 10.8 g/dL — ABNORMAL LOW (ref 12.0–15.0)
MCH: 33.5 pg (ref 26.0–34.0)
MCHC: 30.9 g/dL (ref 30.0–36.0)
MCV: 108.4 fL — ABNORMAL HIGH (ref 78.0–100.0)
Platelets: 150 10*3/uL (ref 150–400)
RBC: 3.22 MIL/uL — ABNORMAL LOW (ref 3.87–5.11)
RDW: 19.2 % — ABNORMAL HIGH (ref 11.5–15.5)
WBC: 10.4 10*3/uL (ref 4.0–10.5)

## 2016-09-13 LAB — RENAL FUNCTION PANEL
Albumin: 2.5 g/dL — ABNORMAL LOW (ref 3.5–5.0)
Anion gap: 13 (ref 5–15)
BUN: 27 mg/dL — ABNORMAL HIGH (ref 6–20)
CO2: 25 mmol/L (ref 22–32)
Calcium: 9.2 mg/dL (ref 8.9–10.3)
Chloride: 98 mmol/L — ABNORMAL LOW (ref 101–111)
Creatinine, Ser: 6.68 mg/dL — ABNORMAL HIGH (ref 0.44–1.00)
GFR calc Af Amer: 7 mL/min — ABNORMAL LOW (ref >60)
GFR calc non Af Amer: 6 mL/min — ABNORMAL LOW (ref >60)
Glucose, Bld: 176 mg/dL — ABNORMAL HIGH (ref 65–99)
Phosphorus: 5 mg/dL — ABNORMAL HIGH (ref 2.5–4.6)
Potassium: 3.4 mmol/L — ABNORMAL LOW (ref 3.5–5.1)
Sodium: 136 mmol/L (ref 135–145)

## 2016-09-13 LAB — GLUCOSE, CAPILLARY
GLUCOSE-CAPILLARY: 321 mg/dL — AB (ref 65–99)
Glucose-Capillary: 122 mg/dL — ABNORMAL HIGH (ref 65–99)
Glucose-Capillary: 301 mg/dL — ABNORMAL HIGH (ref 65–99)

## 2016-09-13 LAB — HEPATITIS B SURFACE ANTIGEN: Hepatitis B Surface Ag: NEGATIVE

## 2016-09-13 MED ORDER — LEVOFLOXACIN 750 MG PO TABS
750.0000 mg | ORAL_TABLET | Freq: Once | ORAL | Status: AC
  Administered 2016-09-13: 750 mg via ORAL
  Filled 2016-09-13 (×2): qty 1

## 2016-09-13 MED ORDER — MIDODRINE HCL 5 MG PO TABS
ORAL_TABLET | ORAL | Status: AC
  Filled 2016-09-13: qty 2

## 2016-09-13 MED ORDER — SODIUM CHLORIDE 0.9 % IV SOLN: 100.0000 mL | INTRAVENOUS | Status: DC | PRN

## 2016-09-13 MED ORDER — DOXERCALCIFEROL 4 MCG/2ML IV SOLN
INTRAVENOUS | Status: AC
  Filled 2016-09-13: qty 2

## 2016-09-13 MED ORDER — LIDOCAINE HCL (PF) 1 % IJ SOLN: 5.0000 mL | INTRAMUSCULAR | Status: DC | PRN

## 2016-09-13 MED ORDER — PENTAFLUOROPROP-TETRAFLUOROETH EX AERO: 1.0000 "application " | INHALATION_SPRAY | CUTANEOUS | Status: DC | PRN

## 2016-09-13 MED ORDER — HEPARIN SODIUM (PORCINE) 1000 UNIT/ML DIALYSIS
6000.0000 [IU] | Freq: Once | INTRAMUSCULAR | Status: DC
  Filled 2016-09-13: qty 6

## 2016-09-13 MED ORDER — ALTEPLASE 2 MG IJ SOLR: 2.0000 mg | Freq: Once | INTRAMUSCULAR | Status: DC | PRN

## 2016-09-13 MED ORDER — HEPARIN SODIUM (PORCINE) 1000 UNIT/ML DIALYSIS: 1000.0000 [IU] | INTRAMUSCULAR | Status: DC | PRN

## 2016-09-13 MED ORDER — LIDOCAINE-PRILOCAINE 2.5-2.5 % EX CREA: 1.0000 "application " | TOPICAL_CREAM | CUTANEOUS | Status: DC | PRN

## 2016-09-13 MED ORDER — LEVOFLOXACIN 500 MG PO TABS: 500.0000 mg | ORAL_TABLET | ORAL | Status: DC

## 2016-09-13 NOTE — Progress Notes (Signed)
Pharmacy Antibiotic Note Kimberly [name] is a 61 y.o. female admitted on 09/11/2016 with pneumonia.   Now changing to po Levaquin for pneumonia   Plan: Levaquin 750 mg po x 1 then 500 mg po Q 48 hours to complete 8 day course Pharmacy to sign off  Height: 5\' 4"  (162.6 cm) Weight: 265 lb 6.9 oz (120.4 kg) IBW/kg (Calculated) : 54.7  Temp (24hrs), Avg:98.2 F (36.8 C), Min:97.8 F (36.6 C), Max:98.6 F (37 C)   Recent Labs Lab 09/11/16 1415 09/11/16 1825 09/11/16 2142 09/12/16 0004 09/12/16 0822 09/13/16 0730  WBC 9.6  --   --   --   --  10.4  CREATININE 3.93*  --   --  4.74*  --  6.68*  LATICACIDVEN  --  2.82* 2.7* 2.3* 2.2*  --     Estimated Creatinine Clearance: 11.3 mL/min (by C-G formula based on SCr of 6.68 mg/dL (H)).    Allergies  Allergen Reactions  . Ciprofloxacin Itching  . Epinephrine     Increased heart rate    Antimicrobials this admission: Cefepime 1/1 >> 1/3 Vancomycin 1/1 >>1/3  Dose adjustments this admission: n/a  Microbiology results: 1/1 BCx:  Thank you for allowing pharmacy to be a part of this patient's care. Kimberly [name], PharmD [phone]  09/13/2016, 8:25 AM

## 2016-09-13 NOTE — Progress Notes (Signed)
PROGRESS NOTE  Kimberly [name] ZOX:[id number] DOB: 06-08-1956 DOA: 09/11/2016 PCP: [name], MD  Brief Summary:  From nursing home with cough, dyspnea, treat for HCAP, +flu. Borderline bp, received gentle hydration ESRD , nephrology notified.  Chronic wound, wound care consulted  HPI/Recap of past 24 hours:  Slightly confused, "how did I get here"   Assessment/Plan: Principal Problem:   HCAP (healthcare-associated pneumonia) Active Problems:   DM (diabetes mellitus), type 2 with renal complications (HCC)   CAD (coronary artery disease), native coronary artery   COPD (chronic obstructive pulmonary disease) (HCC)   Chronic diastolic CHF (congestive heart failure) (HCC)   Chronic respiratory failure with hypoxia (HCC)   Hypothyroidism   ESRD (end stage renal disease) (HCC)   Hypotension arterial   Hyponatremia   Diabetes mellitus, type II, insulin dependent (HCC)  Cough: cxr ? Left mid and lower lung infiltrate vs overlapping breast tissue  Respiratory panel +influenza A, She is started on tamiflu at renal dosing She is continued on vanc/cefepime that was started from the ED, treat for possible HCAP mrsa screen positive, sputum culture not collected. Blood culture pending  Chronic wound, sacral and left heal, wound care consulted and on board. She is already on abx  ESRD  - Pt is typically dialyzed MWF, but had HD on 09/10/16 according to holiday schedule - No indications for urgent HD on admission  - She is dry on admission in setting of acute infection and was given 250 cc NS in ED; BP is soft and she is given another 250 cc on admission   Nephrology Dr Lacy Duverney notified  Chronic diastolic CHF  - TTE (07/09/15) with EF 55-60%, mild concentric hypertrophy, grade 2 diastolic dysfunction, mild MS, and severe pulmonary HTN  - She appears dry on admission and has received 500 cc NS  - Following strict I/Os, fluid-restricting diet    COPD, chronic hypoxic  respiratory failure  on home o2 2liter,  - Continue prn nebs and abx.  Insulin dependent diabetes Recent a1c 9.9 On lantus and ssi Adjust insulin as needed.  morbid obesity: Body mass index is 44.96 kg/m.  Code Status: DNR  Family Communication: patient   Disposition Plan: return to SNF once medically stable   Consultants:  nephrology  Procedures:  HD  Antibiotics:  Vanc/cefepime from admission   Objective: BP (!) 106/26 (BP Location: Right Arm)   Pulse 87   Temp 98.1 F (36.7 C) (Oral)   Resp (!) 24   Ht 5\' 4"  (1.626 m)   Wt 118.8 kg (261 lb 14.5 oz)   SpO2 98%   BMI 44.96 kg/m   Intake/Output Summary (Last 24 hours) at 09/13/16 1404 Last data filed at 09/13/16 1122  Gross per 24 hour  Intake              410 ml  Output             1642 ml  Net            -1232 ml   Filed Weights   09/13/16 0454 09/13/16 0722 09/13/16 1122  Weight: 118 kg (260 lb 1.6 oz) 120.4 kg (265 lb 6.9 oz) 118.8 kg (261 lb 14.5 oz)    Exam:   General:  Awake and alert, in nad  Cardiovascular: RRR, no rubs  Respiratory: some rhonchi left lung fields, no significant wheezing, equal chest rise  Abdomen: Soft/ND/NT, positive BS  Musculoskeletal: No Edema, chronic venous stasis changes, skin wrinkled  Neuro:  Slight confused, drowsy, but able to self correct,   Skin: chronic sacral and left heal wound  Data Reviewed: Basic Metabolic Panel:  Recent Labs Lab 09/11/16 1415 09/12/16 0004 09/13/16 0730  NA 133* 133* 136  K 3.5 3.9 3.4*  CL 97* 95* 98*  CO2 26 22 25   GLUCOSE 249* 325* 176*  BUN 12 16 27*  CREATININE 3.93* 4.74* 6.68*  CALCIUM 9.1 9.1 9.2  PHOS  --   --  5.0*   Liver Function Tests:  Recent Labs Lab 09/13/16 0730  ALBUMIN 2.5*   No results for input(s): LIPASE, AMYLASE in the last 168 hours. No results for input(s): AMMONIA in the last 168 hours. CBC:  Recent Labs Lab 09/11/16 1415 09/13/16 0730  WBC 9.6 10.4  NEUTROABS 7.2  --     HGB 11.4* 10.8*  HCT 36.8 34.9*  MCV 107.3* 108.4*  PLT 159 150   Cardiac Enzymes:   No results for input(s): CKTOTAL, CKMB, CKMBINDEX, TROPONINI in the last 168 hours. BNP (last 3 results) No results for input(s): BNP in the last 8760 hours.  ProBNP (last 3 results) No results for input(s): PROBNP in the last 8760 hours.  CBG:  Recent Labs Lab 09/12/16 0745 09/12/16 1221 09/12/16 1718 09/12/16 2210 09/13/16 1151  GLUCAP 316* 329* 234* 192* 122*    Recent Results (from the past 240 hour(s))  Culture, blood (routine x 2)     Status: None (Preliminary result)   Collection Time: 09/11/16  4:31 PM  Result Value Ref Range Status   Specimen Description BLOOD LEFT HAND  Final   Special Requests BOTTLES DRAWN AEROBIC AND ANAEROBIC 5CC  Final   Culture NO GROWTH < 24 HOURS  Final   Report Status PENDING  Incomplete  Culture, blood (routine x 2)     Status: None (Preliminary result)   Collection Time: 09/11/16  6:01 PM  Result Value Ref Range Status   Specimen Description BLOOD RIGHT HAND  Final   Special Requests BOTTLES DRAWN AEROBIC AND ANAEROBIC 3CC  Final   Culture NO GROWTH < 24 HOURS  Final   Report Status PENDING  Incomplete  Respiratory Panel by PCR     Status: Abnormal   Collection Time: 09/11/16  7:46 PM  Result Value Ref Range Status   Adenovirus NOT DETECTED NOT DETECTED Final   Coronavirus 229E NOT DETECTED NOT DETECTED Final   Coronavirus HKU1 NOT DETECTED NOT DETECTED Final   Coronavirus NL63 NOT DETECTED NOT DETECTED Final   Coronavirus OC43 NOT DETECTED NOT DETECTED Final   Metapneumovirus NOT DETECTED NOT DETECTED Final   Rhinovirus / Enterovirus NOT DETECTED NOT DETECTED Final   Influenza A H3 DETECTED (A) NOT DETECTED Final   Influenza B NOT DETECTED NOT DETECTED Final   Parainfluenza Virus 1 NOT DETECTED NOT DETECTED Final   Parainfluenza Virus 2 NOT DETECTED NOT DETECTED Final   Parainfluenza Virus 3 NOT DETECTED NOT DETECTED Final   Parainfluenza  Virus 4 NOT DETECTED NOT DETECTED Final   Respiratory Syncytial Virus NOT DETECTED NOT DETECTED Final   Bordetella pertussis NOT DETECTED NOT DETECTED Final   Chlamydophila pneumoniae NOT DETECTED NOT DETECTED Final   Mycoplasma pneumoniae NOT DETECTED NOT DETECTED Final  MRSA PCR Screening     Status: Abnormal   Collection Time: 09/12/16  3:42 AM  Result Value Ref Range Status   MRSA by PCR POSITIVE (A) NEGATIVE Final    Comment:        The GeneXpert MRSA  Assay (FDA approved for NASAL specimens only), is one component of a comprehensive MRSA colonization surveillance program. It is not intended to diagnose MRSA infection nor to guide or monitor treatment for MRSA infections. RESULT CALLED TO, READ BACK BY AND VERIFIED WITH: Sarita HaverK. Breinich RN 9:40 09/12/16 ([email or web address])      Studies: No results found.  Scheduled Meds: . aspirin EC  81 mg Oral Daily  . Chlorhexidine Gluconate Cloth  6 each Topical Q0600  . collagenase   Topical Daily  . doxercalciferol  3 mcg Intravenous Q M,W,F-HD  . feeding supplement (PRO-STAT SUGAR FREE 64)  30 mL Oral Daily  . heparin  5,000 Units Subcutaneous Q8H  . hydrocerin   Topical BID  . insulin aspart  0-15 Units Subcutaneous TID WC  . insulin aspart  0-5 Units Subcutaneous QHS  . insulin aspart  4 Units Subcutaneous TID WC  . insulin glargine  40 Units Subcutaneous BID  . [START ON 09/15/2016] levofloxacin  500 mg Oral Q48H  . levothyroxine  75 mcg Oral QAC breakfast  . midodrine  10 mg Oral BID WC  . multivitamin  1 tablet Oral Daily  . mupirocin ointment  1 application Nasal BID  . nystatin   Topical TID  . oseltamivir  30 mg Oral Q M,W,F-2000  . sevelamer carbonate  2.4 g Oral TID WC  . sodium chloride flush  3 mL Intravenous Q12H    Continuous Infusions:   Time spent: 35 mins  Penny PiaVEGA, Edword Cu MD, PhD  [facility] Pager [phone]. If 7PM-7AM, please contact night-coverage at [email or web address], password [email or web address] 09/13/2016, 2:04 PM  LOS: 2 days

## 2016-09-13 NOTE — Procedures (Signed)
I have personally supervised this patient's dialysis session.   Pre weight 120 kg - last post HD weight 116 which pt felt was too low, EDW 119 Trying for goal of 2L (with midodrine for BP support) SBP ~ 100. TDC running reversed. 4K bath pending labs - last K 3.9  Kimberly Balynthia Khi Mcmillen, MD [facility] [phone] Pager 09/13/2016, 7:40 AM

## 2016-09-13 NOTE — Progress Notes (Addendum)
[facility] Rounding Note  Hx: 61 yo WF 61 y.o. female ESRD 2/2 to cardiorenal syndrome/DM on HD at [place]. PMH sig for DM, diabetic neuropathy, morbid obesity, chronic diastolic HF, COPD, pulmonary hypertension, chronic edema/cellulitis BLE, CAD PAF was on Eliquis, now stopped D/T bld from endomet hyperplasia, COPD (former smoker) chronic hypoxic respiratory failure on home O2, hypothyroidism, anemia of chronic disease Admitted with SOB, cough, fever, pulm infiltrates, + Flu A.   Dialysis Orders:  [facility] MWF 4.5 hours 400/Auto 1.5  119 kg 2.0 K/2.25 Ca UF Profile 2 Linear Na Heparin 6000 units IV initial bolus q tx Heparin 3000 units IV mid run Mircera 50 mg IV q 4 weeks (last dose 07/26/2016 Last HGB 10.5 09/06/16) BMD Meds:  Renvela 2.4 grams PO TID w/meals (Last Phos 5.8 Ca 9.1 C Ca 9.9 09/06/16) Hectoral 3 mcg IV q tx (last PTH 357 09/06/16)  Assessment  1.  HCAP:  CXR consolidation within the left mid and lower lobes. On Vanc/cefepime per primary. 2.  Positive  influenza A: Droplet precautions: On Oseltamivir renal dose adjusted.  3.  ESRD -  MWF.  4. Hypotension - Midodrine 10 mg PO BID as OP and 10 mg PO prior to HD. EDW lowered to 119 kg 09/10/16. Wt documented here varies widely. Pre weight 120 for HD 5.  Anemia  - HGB 11.4. No ESA needed. Follow HGB  6.  Metabolic bone disease - Renvela/ Hectorol 7. DM - primary svce 8. H/o chronic dCHF  9. HypoT4 - on replacement 10. OSA 11. MRSA PCR +   Plan - MWF HD, ATB's (vanco and cefipime), Tamiflu, resp precs, usual binders and hectorol, check phos, No ESA needed at this time. Reassess EDW .   Subjective/Interval History:   Still with sig cough and wheezing  Cant get sputum up No chest pain On dialysis at present Says last post HD weight of 116 "way too low" Pre weight today 120 (EDW 119  Objective Vital signs in last 24 hours: Vitals:   09/12/16 0959 09/12/16 1910 09/12/16 2044 09/13/16 0454  BP: 90/60 (!) 94/43 (!) 99/39  (!) 111/46  Pulse: 98 82 84 87  Resp: 18 18 18 18   Temp: 98.6 F (37 C) 98.5 F (36.9 C) 97.8 F (36.6 C) 98.3 F (36.8 C)  TempSrc: Oral Oral Oral Oral  SpO2: 100% 97% 99% 99%  Weight:    118 kg (260 lb 1.6 oz)  Height:       Weight change: -6.305 kg (-13 lb 14.4 oz)  Intake/Output Summary (Last 24 hours) at 09/13/16 0741 Last data filed at 09/12/16 2000  Gross per 24 hour  Intake              290 ml  Output                0 ml  Net              290 ml   Physical Exam:  Blood pressure (!) 111/46, pulse 87, temperature 98.3 F (36.8 C), temperature source Oral, resp. rate 18, height 5\' 4"  (1.626 m), weight 118 kg (260 lb 1.6 oz), SpO2 99 %.  Chronically ill appearing female in NAD Seen in HD. no JVD detected. Bilateral breath sounds with exp wheezing, rhonchorous BS L>R Dry cough, non-productive.  HS distant. RRR with S1 S2. No M/R/G Abdomen: Soft, non-tender, non-distended with normoactive bowel sounds.  M-S:  Doesn't ambulate-uses WC. Strength equal UE.  Lower extremities: Chronic  woody appearing chronic stasis dermatitis BLE.  Neuro: Alert and oriented X 3. Moves all extremities spontaneously. Psych:  Responds to questions appropriately with a normal affect.  [facility]  Labs:  Recent Labs Lab 09/11/16 1415 09/12/16 0004  NA 133* 133*  K 3.5 3.9  CL 97* 95*  CO2 26 22  GLUCOSE 249* 325*  BUN 12 16  CREATININE 3.93* 4.74*  CALCIUM 9.1 9.1    Recent Labs Lab 09/11/16 1415  WBC 9.6  NEUTROABS 7.2  HGB 11.4*  HCT 36.8  MCV 107.3*  PLT 159    Recent Labs Lab 09/11/16 2026 09/12/16 0745 09/12/16 1221 09/12/16 1718 09/12/16 2210  GLUCAP 239* 316* 329* 234* 192*   Studies/Results: Dg Chest 2 View  Result Date: 09/11/2016 CLINICAL DATA:  Patient with generalized weakness and fatigue. EXAM: CHEST  2 VIEW COMPARISON:  Chest radiograph 08/25/2016. FINDINGS: Central venous catheter tip projects over the right atrium. Re-  demonstrated cardiomegaly. Pulmonary vascular redistribution and interstitial opacities bilaterally. Additionally, there is suggestion of increased consolidation within the left mid and lower lung. No pleural effusion or pneumothorax. Thoracic spine degenerative changes. IMPRESSION: Suggestion of increased consolidation within the left mid and lower lung which may be secondary to overlapping breast tissue however pulmonary consolidation in the setting of edema or pneumonia is not excluded. Consider further evaluation with PA chest radiograph. Cardiomegaly. Electronically Signed   By: Annia Beltrew  Davis M.D.   On: 09/11/2016 16:08   Medications:  . aspirin EC  81 mg Oral Daily  . ceFEPime (MAXIPIME) IV  1 g Intravenous Q24H  . Chlorhexidine Gluconate Cloth  6 each Topical Q0600  . collagenase   Topical Daily  . doxercalciferol  3 mcg Intravenous Q M,W,F-HD  . feeding supplement (PRO-STAT SUGAR FREE 64)  30 mL Oral Daily  . heparin  5,000 Units Subcutaneous Q8H  . hydrocerin   Topical BID  . insulin aspart  0-15 Units Subcutaneous TID WC  . insulin aspart  0-5 Units Subcutaneous QHS  . insulin aspart  4 Units Subcutaneous TID WC  . insulin glargine  40 Units Subcutaneous BID  . levothyroxine  75 mcg Oral QAC breakfast  . midodrine      . midodrine  10 mg Oral BID WC  . multivitamin  1 tablet Oral Daily  . mupirocin ointment  1 application Nasal BID  . nystatin   Topical TID  . oseltamivir  30 mg Oral Q M,W,F-2000  . sevelamer carbonate  2.4 g Oral TID WC  . sodium chloride flush  3 mL Intravenous Q12H  . vancomycin  1,000 mg Intravenous Q M,W,F-HD    Camille Balynthia Shawne Eskelson, MD [facility] [phone] Pager 09/13/2016, 8:02 AM

## 2016-09-14 DIAGNOSIS — D509 Iron deficiency anemia, unspecified: Secondary | ICD-10-CM | POA: Diagnosis not present

## 2016-09-14 DIAGNOSIS — Z283 Underimmunization status: Secondary | ICD-10-CM | POA: Diagnosis not present

## 2016-09-14 DIAGNOSIS — N2581 Secondary hyperparathyroidism of renal origin: Secondary | ICD-10-CM | POA: Diagnosis not present

## 2016-09-14 DIAGNOSIS — R2681 Unsteadiness on feet: Secondary | ICD-10-CM | POA: Diagnosis not present

## 2016-09-14 DIAGNOSIS — I2510 Atherosclerotic heart disease of native coronary artery without angina pectoris: Secondary | ICD-10-CM | POA: Diagnosis not present

## 2016-09-14 DIAGNOSIS — E0852 Diabetes mellitus due to underlying condition with diabetic peripheral angiopathy with gangrene: Secondary | ICD-10-CM | POA: Diagnosis not present

## 2016-09-14 DIAGNOSIS — I953 Hypotension of hemodialysis: Secondary | ICD-10-CM | POA: Diagnosis not present

## 2016-09-14 DIAGNOSIS — K5901 Slow transit constipation: Secondary | ICD-10-CM | POA: Diagnosis not present

## 2016-09-14 DIAGNOSIS — I129 Hypertensive chronic kidney disease with stage 1 through stage 4 chronic kidney disease, or unspecified chronic kidney disease: Secondary | ICD-10-CM | POA: Diagnosis not present

## 2016-09-14 DIAGNOSIS — J189 Pneumonia, unspecified organism: Secondary | ICD-10-CM | POA: Diagnosis not present

## 2016-09-14 DIAGNOSIS — R531 Weakness: Secondary | ICD-10-CM | POA: Diagnosis not present

## 2016-09-14 DIAGNOSIS — E1165 Type 2 diabetes mellitus with hyperglycemia: Secondary | ICD-10-CM | POA: Diagnosis not present

## 2016-09-14 DIAGNOSIS — L89600 Pressure ulcer of unspecified heel, unstageable: Secondary | ICD-10-CM | POA: Diagnosis not present

## 2016-09-14 DIAGNOSIS — E871 Hypo-osmolality and hyponatremia: Secondary | ICD-10-CM | POA: Diagnosis not present

## 2016-09-14 DIAGNOSIS — Z992 Dependence on renal dialysis: Secondary | ICD-10-CM | POA: Diagnosis not present

## 2016-09-14 DIAGNOSIS — D631 Anemia in chronic kidney disease: Secondary | ICD-10-CM | POA: Diagnosis not present

## 2016-09-14 DIAGNOSIS — R278 Other lack of coordination: Secondary | ICD-10-CM | POA: Diagnosis not present

## 2016-09-14 DIAGNOSIS — R05 Cough: Secondary | ICD-10-CM | POA: Diagnosis not present

## 2016-09-14 DIAGNOSIS — Z23 Encounter for immunization: Secondary | ICD-10-CM | POA: Diagnosis not present

## 2016-09-14 DIAGNOSIS — J449 Chronic obstructive pulmonary disease, unspecified: Secondary | ICD-10-CM | POA: Diagnosis not present

## 2016-09-14 DIAGNOSIS — J1100 Influenza due to unidentified influenza virus with unspecified type of pneumonia: Secondary | ICD-10-CM | POA: Diagnosis not present

## 2016-09-14 DIAGNOSIS — E039 Hypothyroidism, unspecified: Secondary | ICD-10-CM | POA: Diagnosis not present

## 2016-09-14 DIAGNOSIS — E1121 Type 2 diabetes mellitus with diabetic nephropathy: Secondary | ICD-10-CM | POA: Diagnosis not present

## 2016-09-14 DIAGNOSIS — D638 Anemia in other chronic diseases classified elsewhere: Secondary | ICD-10-CM | POA: Diagnosis not present

## 2016-09-14 DIAGNOSIS — R195 Other fecal abnormalities: Secondary | ICD-10-CM | POA: Diagnosis not present

## 2016-09-14 DIAGNOSIS — J111 Influenza due to unidentified influenza virus with other respiratory manifestations: Secondary | ICD-10-CM | POA: Diagnosis not present

## 2016-09-14 DIAGNOSIS — M6281 Muscle weakness (generalized): Secondary | ICD-10-CM | POA: Diagnosis not present

## 2016-09-14 DIAGNOSIS — I950 Idiopathic hypotension: Secondary | ICD-10-CM | POA: Diagnosis not present

## 2016-09-14 DIAGNOSIS — E1129 Type 2 diabetes mellitus with other diabetic kidney complication: Secondary | ICD-10-CM | POA: Diagnosis not present

## 2016-09-14 DIAGNOSIS — K644 Residual hemorrhoidal skin tags: Secondary | ICD-10-CM | POA: Diagnosis not present

## 2016-09-14 DIAGNOSIS — D689 Coagulation defect, unspecified: Secondary | ICD-10-CM | POA: Diagnosis not present

## 2016-09-14 DIAGNOSIS — J9621 Acute and chronic respiratory failure with hypoxia: Secondary | ICD-10-CM | POA: Diagnosis not present

## 2016-09-14 DIAGNOSIS — Z9181 History of falling: Secondary | ICD-10-CM | POA: Diagnosis not present

## 2016-09-14 DIAGNOSIS — N186 End stage renal disease: Secondary | ICD-10-CM | POA: Diagnosis not present

## 2016-09-14 DIAGNOSIS — E1122 Type 2 diabetes mellitus with diabetic chronic kidney disease: Secondary | ICD-10-CM | POA: Diagnosis not present

## 2016-09-14 DIAGNOSIS — R5381 Other malaise: Secondary | ICD-10-CM | POA: Diagnosis not present

## 2016-09-14 DIAGNOSIS — Z794 Long term (current) use of insulin: Secondary | ICD-10-CM | POA: Diagnosis not present

## 2016-09-14 DIAGNOSIS — L89620 Pressure ulcer of left heel, unstageable: Secondary | ICD-10-CM | POA: Diagnosis not present

## 2016-09-14 DIAGNOSIS — I5032 Chronic diastolic (congestive) heart failure: Secondary | ICD-10-CM | POA: Diagnosis not present

## 2016-09-14 DIAGNOSIS — E119 Type 2 diabetes mellitus without complications: Secondary | ICD-10-CM | POA: Diagnosis not present

## 2016-09-14 DIAGNOSIS — I120 Hypertensive chronic kidney disease with stage 5 chronic kidney disease or end stage renal disease: Secondary | ICD-10-CM | POA: Diagnosis not present

## 2016-09-14 LAB — CBC
HCT: 36.1 % (ref 36.0–46.0)
Hemoglobin: 11.1 g/dL — ABNORMAL LOW (ref 12.0–15.0)
MCH: 32.8 pg (ref 26.0–34.0)
MCHC: 30.7 g/dL (ref 30.0–36.0)
MCV: 106.8 fL — AB (ref 78.0–100.0)
PLATELETS: 145 10*3/uL — AB (ref 150–400)
RBC: 3.38 MIL/uL — AB (ref 3.87–5.11)
RDW: 18.9 % — ABNORMAL HIGH (ref 11.5–15.5)
WBC: 9.3 10*3/uL (ref 4.0–10.5)

## 2016-09-14 LAB — RENAL FUNCTION PANEL
Albumin: 2.4 g/dL — ABNORMAL LOW (ref 3.5–5.0)
Anion gap: 10 (ref 5–15)
BUN: 17 mg/dL (ref 6–20)
CHLORIDE: 97 mmol/L — AB (ref 101–111)
CO2: 26 mmol/L (ref 22–32)
CREATININE: 4.45 mg/dL — AB (ref 0.44–1.00)
Calcium: 9.2 mg/dL (ref 8.9–10.3)
GFR calc non Af Amer: 10 mL/min — ABNORMAL LOW (ref >60)
GFR, EST AFRICAN AMERICAN: 11 mL/min — AB (ref >60)
Glucose, Bld: 158 mg/dL — ABNORMAL HIGH (ref 65–99)
POTASSIUM: 3.9 mmol/L (ref 3.5–5.1)
Phosphorus: 3.4 mg/dL (ref 2.5–4.6)
Sodium: 133 mmol/L — ABNORMAL LOW (ref 135–145)

## 2016-09-14 LAB — GLUCOSE, CAPILLARY
GLUCOSE-CAPILLARY: 238 mg/dL — AB (ref 65–99)
Glucose-Capillary: 143 mg/dL — ABNORMAL HIGH (ref 65–99)
Glucose-Capillary: 173 mg/dL — ABNORMAL HIGH (ref 65–99)

## 2016-09-14 MED ORDER — OSELTAMIVIR PHOSPHATE 30 MG PO CAPS: 30.0000 mg | ORAL_CAPSULE | ORAL | 0 refills | Status: DC | PRN

## 2016-09-14 MED ORDER — LEVOFLOXACIN 500 MG PO TABS: 500.0000 mg | ORAL_TABLET | ORAL | 0 refills | Status: AC

## 2016-09-14 NOTE — Progress Notes (Signed)
[facility] Rounding Note  Hx: 61 yo WF 61 y.o. female ESRD 2/2 to cardiorenal syndrome/DM on HD [place]. PMH sig for DM, diabetic neuropathy, morbid obesity, chronic diastolic HF, COPD, pulmonary hypertension, chronic edema/cellulitis BLE, CAD PAF was on Eliquis, now stopped D/T bld from endomet hyperplasia, COPD (former smoker) chronic hypoxic respiratory failure on home O2, hypothyroidism, anemia of chronic disease Admitted with SOB, cough, fever, pulm infiltrates, + Flu A.   [facility]/Auto 1.5  119 kg 2.0 K/2.25 Ca UF Profile 2 Linear Na Heparin 6000 units IV initial bolus q tx Heparin 3000 units IV mid run Mircera 50 mg IV q 4 weeks (last dose 07/26/2016 Last HGB 10.5 09/06/16) BMD Meds:  Renvela 2.4 grams PO TID w/meals (Last Phos 5.8 Ca 9.1 C Ca 9.9 09/06/16) Hectoral 3 mcg IV q tx (last PTH 357 09/06/16)  Assessment  1.  HCAP:  CXR consolidation within the left mid and lower lobes. IV abx d/c On PO Levaquin  2.  Positive  influenza A: Droplet precautions: On Oseltamivir renal dose adjusted.  3.  ESRD -  MWF  4. Hypotension - Midodrine 10 mg PO BID as OP and 10 mg PO prior to HD. EDW lowered to 119 kg 09/10/16. Wt documented here varies widely. Post HD wt on 1/3 118.8kg  5.  Anemia  - HGB 11.1 No ESA needed. Follow HGB  6.  Metabolic bone disease - Phos controlled  Cont Renvela/ Hectorol 7. DM - primary svce 8. H/o chronic dCHF  9. HypoT4 - on replacement 10. OSA 11. MRSA PCR +   Plan - MWF HD Next HD 1/5,  ATB's,   Tamiflu, resp precs, usual binders and hectorol,  No ESA needed at this time. Follow weights   Subjective/Interval History:   Feels "ok" Cant get sputum up No chest pain HD yesterday with no c/os  Post HD weight 118.8kg   Objective Vital signs in last 24 hours: Vitals:   09/13/16 2045 09/13/16 2121 09/14/16 0553 09/14/16 0859  BP: (!) 98/42  (!) 105/39 (!) 93/58  Pulse: 83 83 89 85  Resp: (!) 21 (!) 22 20 20   Temp: 98.2 F  (36.8 C)  98 F (36.7 C) 98.7 F (37.1 C)  TempSrc: Oral  Oral Oral  SpO2: 100% 100% 100% 100%  Weight: 118.8 kg (261 lb 14.5 oz)     Height:       Weight change: 2.419 kg (5 lb 5.3 oz)  Intake/Output Summary (Last 24 hours) at 09/14/16 0908 Last data filed at 09/14/16 0553  Gross per 24 hour  Intake              360 ml  Output             1644 ml  Net            -1284 ml   Physical Exam:  Blood pressure (!) 93/58, pulse 85, temperature 98.7 F (37.1 C), temperature source Oral, resp. rate 20, height 5\' 4"  (1.626 m), weight 118.8 kg (261 lb 14.5 oz), SpO2 100 %.  Chronically ill appearing obese female in NAD no JVD detected. Bilateral breath sounds with exp wheezing, rhonchorous BS L>R HS distant. RRR with S1 S2. No M/R/G Abdomen: Soft, non-tender, non-distended with normoactive bowel sounds.  M-S:  Doesn't ambulate-uses WC. Strength equal UE.  Lower extremities: Chronic woody appearing chronic stasis dermatitis BLE.  Neuro: Alert and oriented X 3. Moves all extremities  spontaneously. Psych:  Responds to questions appropriately with a normal affect.  Dialysis [facility] with dressing   Labs:  Recent Labs Lab 09/11/16 1415 09/12/16 0004 09/13/16 0730 09/14/16 0531  NA 133* 133* 136 133*  K 3.5 3.9 3.4* 3.9  CL 97* 95* 98* 97*  CO2 26 22 25 26   GLUCOSE 249* 325* 176* 158*  BUN 12 16 27* 17  CREATININE 3.93* 4.74* 6.68* 4.45*  CALCIUM 9.1 9.1 9.2 9.2  PHOS  --   --  5.0* 3.4    Recent Labs Lab 09/11/16 1415 09/13/16 0730 09/14/16 0531  WBC 9.6 10.4 9.3  NEUTROABS 7.2  --   --   HGB 11.4* 10.8* 11.1*  HCT 36.8 34.9* 36.1  MCV 107.3* 108.4* 106.8*  PLT 159 150 145*    Recent Labs Lab 09/12/16 2210 09/13/16 1151 09/13/16 1639 09/13/16 2044 09/14/16 0815  GLUCAP 192* 122* 301* 321* 143*   Studies/Results: No results found. Medications:  . aspirin EC  81 mg Oral Daily  . Chlorhexidine Gluconate Cloth  6 each Topical Q0600  . collagenase    Topical Daily  . doxercalciferol  3 mcg Intravenous Q M,W,F-HD  . feeding supplement (PRO-STAT SUGAR FREE 64)  30 mL Oral Daily  . heparin  5,000 Units Subcutaneous Q8H  . hydrocerin   Topical BID  . insulin aspart  0-15 Units Subcutaneous TID WC  . insulin aspart  0-5 Units Subcutaneous QHS  . insulin aspart  4 Units Subcutaneous TID WC  . insulin glargine  40 Units Subcutaneous BID  . [START ON 09/15/2016] levofloxacin  500 mg Oral Q48H  . levothyroxine  75 mcg Oral QAC breakfast  . midodrine  10 mg Oral BID WC  . multivitamin  1 tablet Oral Daily  . mupirocin ointment  1 application Nasal BID  . nystatin   Topical TID  . sevelamer carbonate  2.4 g Oral TID WC  . sodium chloride flush  3 mL Intravenous Q12H    Tomasa Blase PA-C [facility] Pager [phone] 09/14/2016,9:10 AM

## 2016-09-14 NOTE — [place] FL2 (Signed)
[place] MEDICAID FL2 LEVEL OF CARE SCREENING TOOL     IDENTIFICATION  Patient Name: Kimberly [name] Birthdate: 1956-02-19 Sex: female Admission Date (Current Location): 09/11/2016  [facility] and [place] Number:  Producer, television/film/video and Address:  [facility], [address] [place], [place] [address]      Provider Number: [id number]  Attending Physician Name and Address:  Penny Pia, MD  Relative Name and Phone Number:  Jacqulene Huntley, husband.  [phone] (mobile)    Current Level of Care: Hospital Recommended Level of Care: Skilled Nursing Facility Prior Approval Number:    Date Approved/Denied:   PASRR Number: [id number]  Discharge Plan: SNF    Current Diagnoses: Patient Active Problem List   Diagnosis Date Noted  . Diabetes mellitus, type II, insulin dependent (HCC)   . HCAP (healthcare-associated pneumonia) 09/11/2016  . Pressure injury of skin 08/29/2016  . Cellulitis of leg 08/29/2016  . Chest pain 08/28/2016  . Elevated troponin 08/28/2016  . 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 Hyperkalemia 08/23/2016  . Shortness of breath 08/23/2016  . Hyponatremia 08/23/2016  . Hypotension arterial 09/30/2015  . LBBB (left bundle branch block) 09/30/2015  . Complex endometrial hyperplasia with atypia 09/02/2015  . Severe obesity (HCC) 07/29/2015  . Acute respiratory failure with hypoxemia (HCC) 07/20/2015  . Hypotension 07/20/2015  . Uncontrolled type 2 diabetes mellitus (HCC) 07/20/2015  . Vaginal bleeding   . Altered mental status   . Pulmonary arterial hypertension   . Acute on chronic diastolic congestive heart failure (HCC)   . Paroxysmal atrial fibrillation (HCC) 07/11/2015  . ESRD (end stage renal disease) (HCC) 07/08/2015  . Diabetic ulcer of right foot associated with diabetes mellitus due to underlying condition (HCC)   . Pressure ulcer 04/07/2015  . Obesity hypoventilation syndrome (HCC) 08/05/2014  . Morbid obesity (HCC) 06/28/2014  .  Physical deconditioning 06/28/2014  . Hypothyroidism 06/10/2014  . Carotid stenosis 12/02/2013  . Varicose veins of lower extremities with other complications 12/02/2013  . Chronic respiratory failure with hypoxia (HCC) 10/13/2013  . On home oxygen therapy 10/13/2013  . Chronic diastolic CHF (congestive heart failure) (HCC) 09/12/2013  . CAD (coronary artery disease), native coronary artery   . DM (diabetes mellitus), type 2 with renal complications (HCC) 05/31/2010  . Hyperlipidemia associated with type 2 diabetes mellitus (HCC) 05/31/2010  . COPD (chronic obstructive pulmonary disease) (HCC) 05/31/2010  . Hypertensive heart disease     Orientation RESPIRATION BLADDER Height & Weight     Self, Time, Situation, Place  O2 (2 Liters O2) Incontinent Weight: 261 lb 14.5 oz (118.8 kg) Height:  5\' 4"  (162.6 cm)  BEHAVIORAL SYMPTOMS/MOOD NEUROLOGICAL BOWEL NUTRITION STATUS      Incontinent Diet (Low sodium - heart healty)  AMBULATORY STATUS COMMUNICATION OF NEEDS Skin   Limited Assist Verbally Other (Comment) (Cellulitis to right/left leg; cracking to right/left feet; Moisture associated skin damage to buttocks and sacrum; Excoriated lower bilateral abdomen; fissure to medial sacrum; stage 2 pressure ulcer to sacrum.)                       Personal Care Assistance Level of Assistance  Bathing, Feeding, Dressing Bathing Assistance: Limited assistance Feeding assistance: Independent Dressing Assistance: Limited assistance     Functional Limitations Info    Sight Info: Adequate Hearing Info: Impaired Speech Info: Adequate    SPECIAL CARE FACTORS FREQUENCY  Contractures Contractures Info: Not present    Additional Factors Info  Code Status, Allergies Code Status Info: DNR Allergies Info: Ciprofloxacin, Epinephrine   Insulin Sliding Scale Info: 0-15 Units 3X daily with meals, 0-5 Units daily at bedtime       Current Medications (09/14/2016):   This is the current hospital active medication list Current Facility-Administered Medications  Medication Dose Route Frequency Provider Last Rate Last Dose  . 0.9 %  sodium chloride infusion  250 mL Intravenous PRN Briscoe Deutscher, MD      . acetaminophen (TYLENOL) tablet 650 mg  650 mg Oral Q6H PRN Briscoe Deutscher, MD   650 mg at 09/11/16 2055  . aspirin EC tablet 81 mg  81 mg Oral Daily Briscoe Deutscher, MD   81 mg at 09/14/16 1001  . Chlorhexidine Gluconate Cloth 2 % PADS 6 each  6 each Topical Q0600 Albertine Grates, MD   6 each at 09/14/16 0600  . collagenase (SANTYL) ointment   Topical Daily Albertine Grates, MD      . docusate sodium (COLACE) capsule 100 mg  100 mg Oral Daily PRN Briscoe Deutscher, MD      . doxercalciferol (HECTOROL) injection 3 mcg  3 mcg Intravenous Q M,W,F-HD Pola Corn, NP   3 mcg at 09/13/16 1025  . feeding supplement (PRO-STAT SUGAR FREE 64) liquid 30 mL  30 mL Oral Daily Lavone Neri Opyd, MD   30 mL at 09/12/16 1020  . guaiFENesin-dextromethorphan (ROBITUSSIN DM) 100-10 MG/5ML syrup 10 mL  10 mL Oral Q6H PRN Briscoe Deutscher, MD   10 mL at 09/11/16 2054  . heparin injection 5,000 Units  5,000 Units Subcutaneous Q8H Briscoe Deutscher, MD   5,000 Units at 09/13/16 0600  . hydrocerin (EUCERIN) cream   Topical BID Albertine Grates, MD      . insulin aspart (novoLOG) injection 0-15 Units  0-15 Units Subcutaneous TID WC Briscoe Deutscher, MD   3 Units at 09/14/16 1240  . insulin aspart (novoLOG) injection 0-5 Units  0-5 Units Subcutaneous QHS Briscoe Deutscher, MD   4 Units at 09/13/16 2146  . insulin aspart (novoLOG) injection 4 Units  4 Units Subcutaneous TID WC Briscoe Deutscher, MD   4 Units at 09/14/16 1240  . insulin glargine (LANTUS) injection 40 Units  40 Units Subcutaneous BID Briscoe Deutscher, MD   40 Units at 09/14/16 1010  . ipratropium-albuterol (DUONEB) 0.5-2.5 (3) MG/3ML nebulizer solution 3 mL  3 mL Nebulization Q4H PRN Briscoe Deutscher, MD   3 mL at 09/13/16 2121  . [START ON 09/15/2016] levofloxacin  (LEVAQUIN) tablet 500 mg  500 mg Oral Q48H Penny Pia, MD      . levothyroxine (SYNTHROID, LEVOTHROID) tablet 75 mcg  75 mcg Oral QAC breakfast Briscoe Deutscher, MD   75 mcg at 09/14/16 1001  . midodrine (PROAMATINE) tablet 10 mg  10 mg Oral BID WC Briscoe Deutscher, MD   10 mg at 09/14/16 1001  . multivitamin (RENA-VIT) tablet 1 tablet  1 tablet Oral Daily Briscoe Deutscher, MD   1 tablet at 09/14/16 1001  . mupirocin ointment (BACTROBAN) 2 % 1 application  1 application Nasal BID Albertine Grates, MD   1 application at 09/14/16 0945  . nystatin (MYCOSTATIN/NYSTOP) topical powder   Topical TID Lavone Neri Opyd, MD      . sevelamer carbonate (RENVELA) powder PACK 2.4 g  2.4 g Oral TID WC Timothy S Opyd,  MD   2.4 g at 09/12/16 0802  . sodium chloride (OCEAN) 0.65 % nasal spray 1 spray  1 spray Each Nare PRN Lavone Neriimothy S Opyd, MD      . sodium chloride flush (NS) 0.9 % injection 3 mL  3 mL Intravenous Q12H Lavone Neriimothy S Opyd, MD   3 mL at 09/14/16 1012  . sodium chloride flush (NS) 0.9 % injection 3 mL  3 mL Intravenous PRN Briscoe Deutscherimothy S Opyd, MD         Discharge Medications: Please see discharge summary for a list of discharge medications.  Relevant Imaging Results:  Relevant Lab Results:   Additional Information ss#[id number].  [facility].  Cristobal Goldmannrawford, Reis Pienta Bradley, [name]

## 2016-09-14 NOTE — Progress Notes (Signed)
Inpatient Diabetes Program Recommendations  AACE/ADA: New Consensus Statement on Inpatient Glycemic Control (2015)  Target Ranges:  Prepandial:   less than 140 mg/dL      Peak postprandial:   less than 180 mg/dL (1-2 hours)      Critically ill patients:  140 - 180 mg/dL   Results for [name], Noheli K (MRN [id number]) as of 09/14/2016 09:38  Ref. Range 09/13/2016 11:51 09/13/2016 16:39 09/13/2016 20:44 09/14/2016 08:15  Glucose-Capillary Latest Ref Range: 65 - 99 mg/dL 409122 (H) 811301 (H) 914321 (H) 143 (H)   Review of Glycemic Control  Diabetes history: DM2 Outpatient Diabetes medications: Lantus 40 units BID, Humalog 6 units TID Current orders for Inpatient glycemic control: Lantus 40 units BID, Novolog 0-15 units TID with meals, Novolog 0-5 units QHS, Novolog 4 units TID with meals  Inpatient Diabetes Program Recommendations:  Insulin - Meal Coverage: Please consider increasing meal coverage to Novolog 6 units TID with meals.  NOTE: In reviewing chart, noted Lantus was not given yesterday morning (patient was in hemodialysis). As a result glucose up to 301 mg/dl at 78:2916:39 on 5/6/211/3/18.  Thanks, Orlando PennerMarie Nneoma Harral, RN, MSN, CDE Diabetes Coordinator Inpatient Diabetes Program [phone] (Team Pager from 8am to 5pm)

## 2016-09-14 NOTE — Discharge Summary (Signed)
Physician Discharge Summary  Kimberly [name] ZOX:[id number] DOB: 01/01/1956 DOA: 09/11/2016  PCP: [name], MD  Admit date: 09/11/2016 Discharge date: 09/14/2016  Time spent: > 35 minutes  Recommendations for Outpatient Follow-up:  1. Patient to continue physical therapy facility 2. We'll continue Tamiflu and Levaquin on discharge. Repeat taken after dialysis session for 2 more doses   Discharge Diagnoses:  Principal Problem:   HCAP (healthcare-associated pneumonia) Active Problems:   DM (diabetes mellitus), type 2 with renal complications (HCC)   CAD (coronary artery disease), native coronary artery   COPD (chronic obstructive pulmonary disease) (HCC)   Chronic diastolic CHF (congestive heart failure) (HCC)   Chronic respiratory failure with hypoxia (HCC)   Hypothyroidism   ESRD (end stage renal disease) (HCC)   Hypotension arterial   Hyponatremia   Diabetes mellitus, type II, insulin dependent (HCC)   Discharge Condition: Stable  Diet recommendation: Renal diet/carb modified  Filed Weights   09/13/16 0722 09/13/16 1122 09/13/16 2045  Weight: 120.4 kg (265 lb 6.9 oz) 118.8 kg (261 lb 14.5 oz) 118.8 kg (261 lb 14.5 oz)    History of present illness:  From nursing home with cough, dyspnea, treat for HCAP, +flu. Borderline bp, received gentle hydration ESRD , nephrology notified.  Chronic wound, wound care consulted  Hospital Course:  Cough: Patient is positive for influenza A. D/c on tamiflu  We'll also treat for presumed superimposed bacterial infection. D/c on levaqin  ESRD  -Patient to continue routine dialysis session with nephrologist  Chronic diastolic CHF  - TTE (07/09/15) with EF 55-60%, mild concentric hypertrophy, grade 2 diastolic dysfunction, mild MS, and severe pulmonary HTN  - compensated currently  COPD, chronic hypoxic respiratory failure on home o2 2liter,  - Continue prn nebs and abx.  Insulin dependent diabetes - continue home  medication regimen  morbid obesity: Body mass index is 44.96 kg/m  Procedures:  HD  Consultations:  Nephrology  Discharge Exam: Vitals:   09/14/16 0553 09/14/16 0859  BP: (!) 105/39 (!) 93/58  Pulse: 89 85  Resp: 20 20  Temp: 98 F (36.7 C) 98.7 F (37.1 C)    General: Pt in nad, alert and awake Cardiovascular: rrr, no rubs Respiratory: no increased wob, no wheezes, Nasal cannula in place, no accessory muscle use   Discharge Instructions   Discharge Instructions    Call MD for:  severe uncontrolled pain    Complete by:  As directed    Call MD for:  temperature >100.4    Complete by:  As directed    Diet - low sodium heart healthy    Complete by:  As directed    Discharge instructions    Complete by:  As directed    Please be sure to follow up with your primary care physician in 1-2 weeks or sooner should any new concerns arise.   Increase activity slowly    Complete by:  As directed      Current Discharge Medication List    START taking these medications   Details  levofloxacin (LEVAQUIN) 500 MG tablet Take 1 tablet (500 mg total) by mouth every other day. Qty: 2 tablet, Refills: 0    oseltamivir (TAMIFLU) 30 MG capsule Take 1 capsule (30 mg total) by mouth every dialysis. To be administered after dialysis Qty: 2 capsule, Refills: 0      CONTINUE these medications which have NOT CHANGED   Details  Amino Acids-Protein Hydrolys (FEEDING SUPPLEMENT, PRO-STAT SUGAR FREE 64,) LIQD Take 30 mLs  by mouth daily.    aspirin EC 81 MG tablet Take 81 mg by mouth daily.    guaiFENesin-dextromethorphan (ROBITUSSIN DM) 100-10 MG/5ML syrup Take 10 mLs by mouth every 6 (six) hours as needed for cough.    insulin glargine (LANTUS) 100 UNIT/ML injection Inject 0.4 mLs (40 Units total) into the skin 2 (two) times daily. Qty: 10 mL, Refills: 11    insulin lispro (HUMALOG) 100 UNIT/ML injection Inject 6 Units into the skin 3 (three) times daily before meals.     ipratropium-albuterol (DUONEB) 0.5-2.5 (3) MG/3ML SOLN Take 3 mLs by nebulization every 8 (eight) hours as needed.    levothyroxine (SYNTHROID, LEVOTHROID) 75 MCG tablet Take 75 mcg by mouth daily before breakfast.    midodrine (PROAMATINE) 10 MG tablet Take 1 tablet (10 mg total) by mouth 2 (two) times daily with a meal. Qty: 60 tablet, Refills: 0    multivitamin (RENA-VIT) TABS tablet Take 1 tablet by mouth daily.    sevelamer carbonate (RENVELA) 2.4 g PACK Take 2.4 g by mouth 3 (three) times daily with meals. Qty: 90 each, Refills: 0    acetaminophen (TYLENOL) 325 MG tablet Take 650 mg by mouth every 6 (six) hours as needed for mild pain.    docusate sodium (COLACE) 100 MG capsule Take 100 mg by mouth daily as needed for mild constipation.    hydrocerin (EUCERIN) CREA Apply 1 application topically 2 (two) times daily. Qty: 113 g, Refills: 0    levonorgestrel (MIRENA) 20 MCG/24HR IUD 1 Intra Uterine Device (1 each total) by Intrauterine route once. Qty: 1 each, Refills: 0   Associated Diagnoses: Complex endometrial hyperplasia with atypia    OXYGEN 2lpm 24/7- AHC    sodium chloride (OCEAN) 0.65 % SOLN nasal spray Place 1 spray into both nostrils as needed for congestion.      STOP taking these medications     nystatin (MYCOSTATIN/NYSTOP) powder        Allergies  Allergen Reactions  . Ciprofloxacin Itching  . Epinephrine     Increased heart rate      The results of significant diagnostics from this hospitalization (including imaging, microbiology, ancillary and laboratory) are listed below for reference.    Significant Diagnostic Studies: Dg Chest 2 View  Result Date: 09/11/2016 CLINICAL DATA:  Patient with generalized weakness and fatigue. EXAM: CHEST  2 VIEW COMPARISON:  Chest radiograph 08/25/2016. FINDINGS: Central venous catheter tip projects over the right atrium. Re- demonstrated cardiomegaly. Pulmonary vascular redistribution and interstitial opacities  bilaterally. Additionally, there is suggestion of increased consolidation within the left mid and lower lung. No pleural effusion or pneumothorax. Thoracic spine degenerative changes. IMPRESSION: Suggestion of increased consolidation within the left mid and lower lung which may be secondary to overlapping breast tissue however pulmonary consolidation in the setting of edema or pneumonia is not excluded. Consider further evaluation with PA chest radiograph. Cardiomegaly. Electronically Signed   By: Annia Belt M.D.   On: 09/11/2016 16:08   X-ray Chest Pa And Lateral  Result Date: 08/24/2016 CLINICAL DATA:  Cough, shortness of Breath EXAM: CHEST  2 VIEW COMPARISON:  08/23/2016 FINDINGS: Cardiomegaly again noted. Limited study by patient's large body habitus. No gross infiltrate or pulmonary edema. Haziness in left midlung field most likely from overlying breast tissue. Stable double lumen left IJ catheter with tip in right atrium. IMPRESSION: Limited study by patient's large body habitus. No gross infiltrate or pulmonary edema. Haziness in left midlung field most likely from overlying breast tissue.  Stable double lumen left IJ catheter with tip in right atrium. Electronically Signed   By: Natasha MeadLiviu  Pop M.D.   On: 08/24/2016 08:08   Ct Abdomen Pelvis W Contrast  Result Date: 08/23/2016 CLINICAL DATA:  The sided abdominal pain EXAM: CT ABDOMEN AND PELVIS WITH CONTRAST TECHNIQUE: Multidetector CT imaging of the abdomen and pelvis was performed using the standard protocol following bolus administration of intravenous contrast. CONTRAST:  100mL ISOVUE-300 IOPAMIDOL (ISOVUE-300) INJECTION 61% COMPARISON:  None. FINDINGS: Lower chest: No acute abnormality. Hepatobiliary: The liver is diffusely fatty infiltrated. The gallbladder is within normal limits. Pancreas: Unremarkable. No pancreatic ductal dilatation or surrounding inflammatory changes. Spleen: Normal in size without focal abnormality. Adrenals/Urinary Tract:  Adrenal glands are unremarkable. Kidneys are normal, without renal calculi, focal lesion, or hydronephrosis. Bladder is unremarkable. Stomach/Bowel: Stomach is within normal limits. Appendix appears normal. No evidence of bowel wall thickening, distention, or inflammatory changes. Vascular/Lymphatic: Aortic atherosclerosis. No enlarged abdominal or pelvic lymph nodes. Reproductive: IUD is noted in place. No other focal abnormality is noted. Other: Some laxity of the anterior abdominal wall is noted although no true hernia is seen. No abdominopelvic ascites. Musculoskeletal: No acute or significant osseous findings. IMPRESSION: Chronic changes without acute abnormality. Electronically Signed   By: Alcide CleverMark  Lukens M.D.   On: 08/23/2016 10:56   Dg Chest Port 1 View  Result Date: 08/25/2016 CLINICAL DATA:  Follow-up pulmonary edema EXAM: PORTABLE CHEST 1 VIEW COMPARISON:  08/24/2016 FINDINGS: Cardiac shadow is mildly prominent and stable. Left jugular dialysis catheter is again seen. Lungs are clear bilaterally. No vascular congestion or pulmonary edema is seen. No effusions are noted. IMPRESSION: No active disease. Electronically Signed   By: Alcide CleverMark  Lukens M.D.   On: 08/25/2016 17:13   Dg Chest Portable 1 View  Result Date: 08/23/2016 CLINICAL DATA:  Abdominal pain. EXAM: PORTABLE CHEST 1 VIEW COMPARISON:  07/10/2015 . FINDINGS: Left IJ dual-lumen catheter noted with tip projected over right atrium. Cardiomegaly with bilateral pulmonary interstitial prominence consistent with congestive heart failure. No pleural effusion or pneumothorax . IMPRESSION: 1. Left IJ dual-lumen catheter with tip projected over the right atrium. 2. Congestive heart failure with mild pulmonary interstitial edema. Electronically Signed   By: Maisie Fushomas  Register   On: 08/23/2016 10:00   Dg Knee Right Port  Result Date: 08/26/2016 CLINICAL DATA:  Knee pain without trauma EXAM: PORTABLE RIGHT KNEE - 1-2 VIEW COMPARISON:  None. FINDINGS:  Vascular calcifications. Degenerative changes in the patellofemoral compartment. No fracture, dislocation, or joint effusion. IMPRESSION: No acute abnormalities. Electronically Signed   By: Gerome Samavid  Williams III M.D   On: 08/26/2016 18:03    Microbiology: Recent Results (from the past 240 hour(s))  Culture, blood (routine x 2)     Status: None (Preliminary result)   Collection Time: 09/11/16  4:31 PM  Result Value Ref Range Status   Specimen Description BLOOD LEFT HAND  Final   Special Requests BOTTLES DRAWN AEROBIC AND ANAEROBIC 5CC  Final   Culture NO GROWTH 2 DAYS  Final   Report Status PENDING  Incomplete  Culture, blood (routine x 2)     Status: None (Preliminary result)   Collection Time: 09/11/16  6:01 PM  Result Value Ref Range Status   Specimen Description BLOOD RIGHT HAND  Final   Special Requests BOTTLES DRAWN AEROBIC AND ANAEROBIC 3CC  Final   Culture NO GROWTH 2 DAYS  Final   Report Status PENDING  Incomplete  Respiratory Panel by PCR  Status: Abnormal   Collection Time: 09/11/16  7:46 PM  Result Value Ref Range Status   Adenovirus NOT DETECTED NOT DETECTED Final   Coronavirus 229E NOT DETECTED NOT DETECTED Final   Coronavirus HKU1 NOT DETECTED NOT DETECTED Final   Coronavirus NL63 NOT DETECTED NOT DETECTED Final   Coronavirus OC43 NOT DETECTED NOT DETECTED Final   Metapneumovirus NOT DETECTED NOT DETECTED Final   Rhinovirus / Enterovirus NOT DETECTED NOT DETECTED Final   Influenza A H3 DETECTED (A) NOT DETECTED Final   Influenza B NOT DETECTED NOT DETECTED Final   Parainfluenza Virus 1 NOT DETECTED NOT DETECTED Final   Parainfluenza Virus 2 NOT DETECTED NOT DETECTED Final   Parainfluenza Virus 3 NOT DETECTED NOT DETECTED Final   Parainfluenza Virus 4 NOT DETECTED NOT DETECTED Final   Respiratory Syncytial Virus NOT DETECTED NOT DETECTED Final   Bordetella pertussis NOT DETECTED NOT DETECTED Final   Chlamydophila pneumoniae NOT DETECTED NOT DETECTED Final    Mycoplasma pneumoniae NOT DETECTED NOT DETECTED Final  MRSA PCR Screening     Status: Abnormal   Collection Time: 09/12/16  3:42 AM  Result Value Ref Range Status   MRSA by PCR POSITIVE (A) NEGATIVE Final    Comment:        The GeneXpert MRSA Assay (FDA approved for NASAL specimens only), is one component of a comprehensive MRSA colonization surveillance program. It is not intended to diagnose MRSA infection nor to guide or monitor treatment for MRSA infections. RESULT CALLED TO, READ BACK BY AND VERIFIED WITH: Sarita Haver RN 9:40 09/12/16 ([email or web address])      Labs: Basic Metabolic Panel:  Recent Labs Lab 09/11/16 1415 09/12/16 0004 09/13/16 0730 09/14/16 0531  NA 133* 133* 136 133*  K 3.5 3.9 3.4* 3.9  CL 97* 95* 98* 97*  CO2 26 22 25 26   GLUCOSE 249* 325* 176* 158*  BUN 12 16 27* 17  CREATININE 3.93* 4.74* 6.68* 4.45*  CALCIUM 9.1 9.1 9.2 9.2  PHOS  --   --  5.0* 3.4   Liver Function Tests:  Recent Labs Lab 09/13/16 0730 09/14/16 0531  ALBUMIN 2.5* 2.4*   No results for input(s): LIPASE, AMYLASE in the last 168 hours. No results for input(s): AMMONIA in the last 168 hours. CBC:  Recent Labs Lab 09/11/16 1415 09/13/16 0730 09/14/16 0531  WBC 9.6 10.4 9.3  NEUTROABS 7.2  --   --   HGB 11.4* 10.8* 11.1*  HCT 36.8 34.9* 36.1  MCV 107.3* 108.4* 106.8*  PLT 159 150 145*   Cardiac Enzymes: No results for input(s): CKTOTAL, CKMB, CKMBINDEX, TROPONINI in the last 168 hours. BNP: BNP (last 3 results) No results for input(s): BNP in the last 8760 hours.  ProBNP (last 3 results) No results for input(s): PROBNP in the last 8760 hours.  CBG:  Recent Labs Lab 09/13/16 1151 09/13/16 1639 09/13/16 2044 09/14/16 0815 09/14/16 1142  GLUCAP 122* 301* 321* 143* 173*   Signed:  Penny Pia MD.  [facility] 09/14/2016, 1:42 PM

## 2016-09-14 NOTE — Clinical Social Work Note (Signed)
Clinical Social Work Assessment  Patient Details  Name: Kimberly ManilaSusan K Nanda MRN: [id number] Date of Birth: 1956/03/24  Date of referral:  09/13/16               Reason for consult:  Facility Placement                Permission sought to share information with:  Family Supports Permission granted to share information::  Yes, Verbal Permission Granted  Name::     Valetta FullerJoel Hogate  Agency::     Relationship::  Husband  Contact Information:  [phone] (mobile)  Housing/Transportation Living arrangements for the past 2 months:  Skilled Nursing Facility [facility]-admitted 08/31/16) Source of Information:  Spouse Patient Interpreter Needed:  None Criminal Activity/Legal Involvement Pertinent to Current Situation/Hospitalization:  No - Comment as needed Significant Relationships:  Spouse Lives with:  Spouse Do you feel safe going back to the place where you live?  No (Husband indicated that patient needs to [facility] to continue with rehab in order to safe once she returns home as he works during the day) Need for family participation in patient care:  Yes (Comment)  Care giving concerns:  Husband reported that [facility] will [facility] as he works during the day and cannot care for her at home.   Social Worker assessment / plan: CSW talked with husband by phone and confirmed return to [facility]. Attempted to talk with patient, however she was asleep and did not awaken when called. When asked Mr. Montufar reported that they do not have any children.  Employment status:  Disabled (Comment on whether or not currently receiving Disability) Insurance information:  Armed forces operational officerMedicare, Managed Care (OncologistGeneric Commercial) PT Recommendations:  Skilled Nursing Facility Information / Referral to community resources:  Other (Comment Required) (None needed or requested as patient from skilled facility)  Patient/Family's Response to care: No concerns expressed regarding care during  hospitalization.  Patient/Family's Understanding of and Emotional Response to Diagnosis, Current Treatment, and Prognosis:  Not discussed.  Emotional Assessment Appearance:  Appears stated age Attitude/Demeanor/Rapport:  Unable to Assess (Patient was asleep and did not awaken when called) Affect (typically observed):  Unable to Assess (Patient asleep) Orientation:  Oriented to Self, Oriented to Place, Oriented to  Time, Oriented to Situation Alcohol / Substance use:  Tobacco Use, Alcohol Use, Illicit Drugs (Patient reported that she quit smoking and does not consume alcohol or use illicit drugs) Psych involvement (Current and /or in the community):  No (Comment)  Discharge Needs  Concerns to be addressed:  Discharge Planning Concerns Readmission within the last 30 days:  Yes Current discharge risk:  None Barriers to Discharge:  No Barriers Identified   Cristobal GoldmannCrawford, Aspasia Rude Bradley, [name] 09/14/2016, 2:22 PM

## 2016-09-14 NOTE — Progress Notes (Signed)
Attempted to call report to Nurse at [facility]. 3 phone calls were made and each time after introducing myself as pts nurse calling report I was placed on hold for lengthy periods of time. Pt is ready for transport and things are packed up.

## 2016-09-14 NOTE — Clinical Social Work Note (Signed)
Patient medically stable for discharge back to [facility] this evening. Facility notified and discharge clinicals transmitted to [facility]. Husband contacted and advised of discharge and ambulance transport.  Genelle BalVanessa Demba Nigh, MSW, [name] Licensed Clinical Social Worker Clinical Social Work Department [facility] [phone]

## 2016-09-15 ENCOUNTER — Other Ambulatory Visit: Payer: Self-pay | Admitting: *Deleted

## 2016-09-15 DIAGNOSIS — N186 End stage renal disease: Secondary | ICD-10-CM | POA: Diagnosis not present

## 2016-09-15 DIAGNOSIS — D631 Anemia in chronic kidney disease: Secondary | ICD-10-CM | POA: Diagnosis not present

## 2016-09-15 DIAGNOSIS — T82868A Thrombosis of vascular prosthetic devices, implants and grafts, initial encounter: Secondary | ICD-10-CM

## 2016-09-15 DIAGNOSIS — Z01810 Encounter for preprocedural cardiovascular examination: Secondary | ICD-10-CM

## 2016-09-15 DIAGNOSIS — E119 Type 2 diabetes mellitus without complications: Secondary | ICD-10-CM | POA: Diagnosis not present

## 2016-09-15 DIAGNOSIS — N2581 Secondary hyperparathyroidism of renal origin: Secondary | ICD-10-CM | POA: Diagnosis not present

## 2016-09-15 DIAGNOSIS — D689 Coagulation defect, unspecified: Secondary | ICD-10-CM | POA: Diagnosis not present

## 2016-09-16 LAB — CULTURE, BLOOD (ROUTINE X 2)
CULTURE: NO GROWTH
Culture: NO GROWTH

## 2016-09-18 ENCOUNTER — Non-Acute Institutional Stay (SKILLED_NURSING_FACILITY): Payer: Medicare Other | Admitting: Internal Medicine

## 2016-09-18 ENCOUNTER — Encounter: Payer: Self-pay | Admitting: Internal Medicine

## 2016-09-18 DIAGNOSIS — J1100 Influenza due to unidentified influenza virus with unspecified type of pneumonia: Secondary | ICD-10-CM | POA: Diagnosis not present

## 2016-09-18 DIAGNOSIS — N186 End stage renal disease: Secondary | ICD-10-CM

## 2016-09-18 DIAGNOSIS — D638 Anemia in other chronic diseases classified elsewhere: Secondary | ICD-10-CM | POA: Diagnosis not present

## 2016-09-18 DIAGNOSIS — N2581 Secondary hyperparathyroidism of renal origin: Secondary | ICD-10-CM | POA: Diagnosis not present

## 2016-09-18 DIAGNOSIS — R5381 Other malaise: Secondary | ICD-10-CM | POA: Diagnosis not present

## 2016-09-18 DIAGNOSIS — K644 Residual hemorrhoidal skin tags: Secondary | ICD-10-CM | POA: Diagnosis not present

## 2016-09-18 DIAGNOSIS — E871 Hypo-osmolality and hyponatremia: Secondary | ICD-10-CM

## 2016-09-18 DIAGNOSIS — J9621 Acute and chronic respiratory failure with hypoxia: Secondary | ICD-10-CM

## 2016-09-18 DIAGNOSIS — D689 Coagulation defect, unspecified: Secondary | ICD-10-CM | POA: Diagnosis not present

## 2016-09-18 DIAGNOSIS — I953 Hypotension of hemodialysis: Secondary | ICD-10-CM

## 2016-09-18 DIAGNOSIS — J189 Pneumonia, unspecified organism: Secondary | ICD-10-CM

## 2016-09-18 DIAGNOSIS — R195 Other fecal abnormalities: Secondary | ICD-10-CM | POA: Diagnosis not present

## 2016-09-18 DIAGNOSIS — E119 Type 2 diabetes mellitus without complications: Secondary | ICD-10-CM | POA: Diagnosis not present

## 2016-09-18 DIAGNOSIS — Z794 Long term (current) use of insulin: Secondary | ICD-10-CM

## 2016-09-18 DIAGNOSIS — D631 Anemia in chronic kidney disease: Secondary | ICD-10-CM | POA: Diagnosis not present

## 2016-09-18 DIAGNOSIS — E1121 Type 2 diabetes mellitus with diabetic nephropathy: Secondary | ICD-10-CM | POA: Diagnosis not present

## 2016-09-18 DIAGNOSIS — Z992 Dependence on renal dialysis: Secondary | ICD-10-CM | POA: Diagnosis not present

## 2016-09-18 NOTE — Progress Notes (Signed)
LOCATION: [facility]  PCP: [name], MD   Code Status: DNR  Goals of care: Advanced Directive information Advanced Directives 09/11/2016  Does Patient Have a Medical Advance Directive? Yes  Type of Advance Directive Out of facility DNR (pink MOST or yellow form)  Does patient want to make changes to medical advance directive? No - Patient declined  Would patient like information on creating a medical advance directive? No - Patient declined  Pre-existing out of facility DNR order (yellow form or pink MOST form) -       Extended Emergency Contact Information Primary Emergency Contact: Nader,Joel Address: [address]          [place], [place] Work Phone: [phone] Mobile Phone: [phone] Relation: Spouse Secondary Emergency Contact: Alwyn [place] Phone: [phone] Relation: Brother   Allergies  Allergen Reactions  . Ciprofloxacin Itching  . Epinephrine     Increased heart rate    Chief Complaint  Patient presents with  . Readmit To SNF    Readmission Visit     HPI:  Patient is a 61 y.o. female seen today for short term rehabilitation post hospital re-admission from 09/11/2016-09/14/2016 with acute respiratory failure from influenza pneumonia and hypotension. She was started on IV fluids, Tamiflu and Levaquin. Of note, patient was undergoing rehabilitation at this facility post hospital admission from 08/23/2016-08/31/2016 with acute on chronic hypoxic respiratory failure from COPD exacerbation, left lower Extremity cellulitis.She has medical history of type 2 diabetes, end-stage Renal disease on dialysis, hypertensive heart disease , COPD, chronic diastolic congestive heart failure. She is seen in her room today.   Review of Systems:  Constitutional: Negative for fever, chills, diaphoresis.  positive for low energy level. HENT: Negative for headache, congestion, sore throat,  difficulty swallowing.   Eyes: Negative for blurred vision, double vision and discharge.  Respiratory: Negative for wheezing. Positive for cough with white phlegm, shortness of breath with exertion and on oxygen continuously at home.  Cardiovascular: Negative for chest pain. Positive for occasional palpitations.   Gastrointestinal: Negative for heartburn, nausea, vomiting, abdominal pain. Last bowel movement was today with loose stool. She complains of pain and itching around her rectal area.  Genitourinary: She is anuric.  Musculoskeletal: Negative for back pain, fall in the facility.  Skin: Negative for itching, rash.  Neurological: Positive for occasional dizziness and chronic neuropathy. Psychiatric/Behavioral: Negative for depression   Past Medical History:  Diagnosis Date  . Anemia   . Anxiety   . Arthritis    "knees, feet, hands" (08/23/2016)  . Asthma   . CAD, NATIVE VESSEL    May 10, 2010 cath showed a hyperdynamic LV function, she had dominant circumflex anatomy with a 70-80% small OM1. She had diffuse diabetic plaque particularly in the distal LAD. She nondominant RCA.  Nondominant  . Cellulitis 10/15/2013  . CHF (congestive heart failure) (HCC)    Preserved EF  . Chronic bronchitis (HCC)    "probably once/ yr" (08/23/2016)  . Complication of anesthesia    "I've had difficulty waking up" (08/23/2016)  . COPD   . Depression   . Diabetic neuropathy (HCC)   . Endometrial hyperplasia   . ESRD (end stage renal disease) on dialysis (HCC)    "[facility]; MWF; [facility]" (08/23/2016)  . GERD (gastroesophageal reflux disease)   . History of hiatal hernia   . HYPERLIPIDEMIA   . HYPERTENSION   . Hypothyroidism   . Myocardial infarct, old   .  On home oxygen therapy    "2L; 24/7" (08/23/2016)  . Paralyzed vocal cords   . Peripheral neuropathy (HCC)    hx/notes 01/27/2010  . Pneumonia "several times"  . Proliferative retinopathy    hx/notes 01/27/2010  . PVD    CEA  .  Restless leg syndrome    "mostly on the right" (08/23/2016)  . Sleep apnea    not on cpap    . 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 Type II diabetes mellitus (HCC)    Past Surgical History:  Procedure Laterality Date  . AV FISTULA PLACEMENT Right 07/08/2015   Procedure: EXPLORATION RIGHT AXILLARY ARTERY AND RIGHT BRACHIAL VEIN;  Surgeon: Chuck Hint, MD;  Location: [facility] OR;  Service: Vascular;  Laterality: Right;  . CAROTID ENDARTERECTOMY Left X 2  . CATARACT EXTRACTION W/ INTRAOCULAR LENS  IMPLANT, BILATERAL Bilateral   . CERVICAL BIOPSY  ~ 2015   "precancerous cells"  . COLONOSCOPY    . EYE SURGERY Bilateral    numerous surgeries  . INSERTION OF DIALYSIS CATHETER N/A 06/24/2015   Procedure: ULTRASOUND BILATERAL INTERNAL JUGULAR VEIN INSERTION OF DIALYSIS CATHETER LEFT INTERNAL JUGULAR VEIN ;  Surgeon: Pryor Ochoa, MD;  Location: [facility] OR;  Service: Vascular;  Laterality: N/A;  . INTRAUTERINE DEVICE INSERTION  ~ 2015  . VITRECTOMY Bilateral    Social History:   reports that she quit smoking about 11 years ago. Her smoking use included Cigarettes. She has a 20.00 pack-year smoking history. She has never used smokeless tobacco. She reports that she does not drink alcohol or use drugs.  Family History  Problem Relation Age of Onset  . Cancer Mother     Breast, NHL  . Stroke Mother   . Peripheral vascular disease Father   . CAD Father 33  . Heart attack Father   . Hypertension Father   . Asthma Father   . Heart disease Father     before age 68    Medications: Allergies as of 09/18/2016      Reactions   Ciprofloxacin Itching   Epinephrine    Increased heart rate      Medication List       Accurate as of 09/18/16 11:42 AM. Always use your most recent med list.          acetaminophen 325 MG tablet Commonly known as:  TYLENOL Take 650 mg by  mouth every 6 (six) hours as needed for mild pain.   aspirin EC 81 MG tablet Take 81 mg by mouth daily.   docusate sodium 100 MG capsule Commonly known as:  COLACE Take 100 mg by mouth daily as needed for mild constipation.   feeding supplement (PRO-STAT SUGAR FREE 64) Liqd Take 30 mLs by mouth daily.   guaiFENesin-dextromethorphan 100-10 MG/5ML syrup Commonly known as:  ROBITUSSIN DM Take 10 mLs by mouth every 6 (six) hours as needed for cough.   hydrocerin Crea Apply 1 application topically 2 (two) times daily.   insulin glargine 100 UNIT/ML injection Commonly known as:  LANTUS Inject 0.4 mLs (40 Units total) into the skin 2 (two) times daily.   insulin lispro 100 UNIT/ML injection Commonly known as:  HUMALOG Inject 6 Units into the skin 3 (three) times daily before meals.   ipratropium-albuterol 0.5-2.5 (3) MG/3ML Soln Commonly known as:  DUONEB Take 3 mLs by  nebulization every 8 (eight) hours as needed.   levofloxacin 500 MG tablet Commonly known as:  LEVAQUIN Take 1 tablet (500 mg total) by mouth every other day.   levonorgestrel 20 MCG/24HR IUD Commonly known as:  MIRENA 1 Intra Uterine Device (1 each total) by Intrauterine route once.   levothyroxine 75 MCG tablet Commonly known as:  SYNTHROID, LEVOTHROID Take 75 mcg by mouth daily before breakfast.   midodrine 10 MG tablet Commonly known as:  PROAMATINE Take 1 tablet (10 mg total) by mouth 2 (two) times daily with a meal.   multivitamin Tabs tablet Take 1 tablet by mouth daily.   oseltamivir 30 MG capsule Commonly known as:  TAMIFLU Take 1 capsule (30 mg total) by mouth every dialysis. To be administered after dialysis   OXYGEN 2lpm 24/7- [facility]   sevelamer carbonate 2.4 g Pack Commonly known as:  RENVELA Take 2.4 g by mouth 3 (three) times daily with meals.   sodium chloride 0.65 % Soln nasal spray Commonly known as:  OCEAN Place 1 spray into both nostrils as needed for congestion.        Immunizations: Immunization History  Administered Date(s) Administered  . Influenza Split 06/12/2015  . Influenza-Unspecified 05/12/2014  . Pneumococcal Conjugate-13 07/12/2014     Physical Exam:  Vitals:   09/18/16 1137  BP: 124/62  Pulse: 100  Resp: 18  Temp: 98.1 F (36.7 C)  TempSrc: Oral  SpO2: 100%  Weight: 275 lb (124.7 kg)  Height: 5\' 4"  (1.626 m)   Body mass index is 47.2 kg/m.  General- elderly female, morbidly oese, in no acute distress Head- normocephalic, atraumatic Nose- no maxillary or frontal sinus tenderness, no nasal discharge Throat- moist mucus membrane Eyes- PERRLA, EOMI, no pallor, no icterus Neck- no cervical lymphadenopathy Cardiovascular- normal s1,s2, no murmur Respiratory- bilateral clear to auscultation, no wheeze, no rhonchi, no crackles, no use of accessory muscles , on 2L oxygen  by nasal cannula Abdomen- bowel sounds present, soft, non tender, distended, external hemorrhoids noted on the rectal vault, no bleed noted Musculoskeletal- able to move all 4 extremities, generalized weakness, dialysis port on left chest wall  Neurological- alert and oriented to person, place and time Skin- warm and dry, chronic skin changes to her lower legs, trace leg edema, bruise to left side of the abdominal wall, wound dressing to her sacral area. Psychiatry- normal mood and affect    Labs reviewed: Basic Metabolic Panel:  Recent Labs  16/10/96 0830  09/12/16 0004 09/13/16 0730 09/14/16 0531  NA 129*  < > 133* 136 133*  K 3.8  < > 3.9 3.4* 3.9  CL 94*  < > 95* 98* 97*  CO2 22  < > 22 25 26   GLUCOSE 230*  < > 325* 176* 158*  BUN 29*  < > 16 27* 17  CREATININE 5.65*  < > 4.74* 6.68* 4.45*  CALCIUM 8.9  < > 9.1 9.2 9.2  PHOS 7.2*  --   --  5.0* 3.4  < > = values in this interval not displayed. Liver Function Tests:  Recent Labs  08/23/16 0940  08/24/16 0329  08/30/16 0830 09/13/16 0730 09/14/16 0531  AST 15  --  13*  --   --   --    --   ALT 22  --  16  --   --   --   --   ALKPHOS 182*  --  156*  --   --   --   --  BILITOT 0.8  --  0.6  --   --   --   --   PROT 8.8*  --  7.4  --   --   --   --   ALBUMIN 3.3*  < > 2.5*  < > 2.2* 2.5* 2.4*  < > = values in this interval not displayed. No results for input(s): LIPASE, AMYLASE in the last 8760 hours. No results for input(s): AMMONIA in the last 8760 hours. CBC:  Recent Labs  08/23/16 0940  09/06/16 09/11/16 1415 09/13/16 0730 09/14/16 0531  WBC 31.0*  < > 12.9 9.6 10.4 9.3  NEUTROABS 29.2*  --  11 7.2  --   --   HGB 10.9*  < > 10.5* 11.4* 10.8* 11.1*  HCT 32.9*  < > 34* 36.8 34.9* 36.1  MCV 102.2*  < >  --  107.3* 108.4* 106.8*  PLT 212  < > 189 159 150 145*  < > = values in this interval not displayed. Cardiac Enzymes:  Recent Labs  08/28/16 0545 08/28/16 1141 08/29/16 0423  TROPONINI 0.09* 0.13* 0.11*   BNP: Invalid input(s): POCBNP CBG:  Recent Labs  09/14/16 0815 09/14/16 1142 09/14/16 1642  GLUCAP 143* 173* 238*    Radiological Exams: Dg Chest 2 View  Result Date: 09/11/2016 CLINICAL DATA:  Patient with generalized weakness and fatigue. EXAM: CHEST  2 VIEW COMPARISON:  Chest radiograph 08/25/2016. FINDINGS: Central venous catheter tip projects over the right atrium. Re- demonstrated cardiomegaly. Pulmonary vascular redistribution and interstitial opacities bilaterally. Additionally, there is suggestion of increased consolidation within the left mid and lower lung. No pleural effusion or pneumothorax. Thoracic spine degenerative changes. IMPRESSION: Suggestion of increased consolidation within the left mid and lower lung which may be secondary to overlapping breast tissue however pulmonary consolidation in the setting of edema or pneumonia is not excluded. Consider further evaluation with PA chest radiograph. Cardiomegaly. Electronically Signed   By: Annia Beltrew  Davis M.D.   On: 09/11/2016 16:08   X-ray Chest Pa And Lateral  Result Date:  08/24/2016 CLINICAL DATA:  Cough, shortness of Breath EXAM: CHEST  2 VIEW COMPARISON:  08/23/2016 FINDINGS: Cardiomegaly again noted. Limited study by patient's large body habitus. No gross infiltrate or pulmonary edema. Haziness in left midlung field most likely from overlying breast tissue. Stable double lumen left IJ catheter with tip in right atrium. IMPRESSION: Limited study by patient's large body habitus. No gross infiltrate or pulmonary edema. Haziness in left midlung field most likely from overlying breast tissue. Stable double lumen left IJ catheter with tip in right atrium. Electronically Signed   By: Natasha MeadLiviu  Pop M.D.   On: 08/24/2016 08:08   Ct Abdomen Pelvis W Contrast  Result Date: 08/23/2016 CLINICAL DATA:  The sided abdominal pain EXAM: CT ABDOMEN AND PELVIS WITH CONTRAST TECHNIQUE: Multidetector CT imaging of the abdomen and pelvis was performed using the standard protocol following bolus administration of intravenous contrast. CONTRAST:  100mL ISOVUE-300 IOPAMIDOL (ISOVUE-300) INJECTION 61% COMPARISON:  None. FINDINGS: Lower chest: No acute abnormality. Hepatobiliary: The liver is diffusely fatty infiltrated. The gallbladder is within normal limits. Pancreas: Unremarkable. No pancreatic ductal dilatation or surrounding inflammatory changes. Spleen: Normal in size without focal abnormality. Adrenals/Urinary Tract: Adrenal glands are unremarkable. Kidneys are normal, without renal calculi, focal lesion, or hydronephrosis. Bladder is unremarkable. Stomach/Bowel: Stomach is within normal limits. Appendix appears normal. No evidence of bowel wall thickening, distention, or inflammatory changes. Vascular/Lymphatic: Aortic atherosclerosis. No enlarged abdominal or pelvic lymph nodes. Reproductive: IUD  is noted in place. No other focal abnormality is noted. Other: Some laxity of the anterior abdominal wall is noted although no true hernia is seen. No abdominopelvic ascites. Musculoskeletal: No acute  or significant osseous findings. IMPRESSION: Chronic changes without acute abnormality. Electronically Signed   By: Alcide Clever M.D.   On: 08/23/2016 10:56   Dg Chest Port 1 View  Result Date: 08/25/2016 CLINICAL DATA:  Follow-up pulmonary edema EXAM: PORTABLE CHEST 1 VIEW COMPARISON:  08/24/2016 FINDINGS: Cardiac shadow is mildly prominent and stable. Left jugular dialysis catheter is again seen. Lungs are clear bilaterally. No vascular congestion or pulmonary edema is seen. No effusions are noted. IMPRESSION: No active disease. Electronically Signed   By: Alcide Clever M.D.   On: 08/25/2016 17:13   Dg Chest Portable 1 View  Result Date: 08/23/2016 CLINICAL DATA:  Abdominal pain. EXAM: PORTABLE CHEST 1 VIEW COMPARISON:  07/10/2015 . FINDINGS: Left IJ dual-lumen catheter noted with tip projected over right atrium. Cardiomegaly with bilateral pulmonary interstitial prominence consistent with congestive heart failure. No pleural effusion or pneumothorax . IMPRESSION: 1. Left IJ dual-lumen catheter with tip projected over the right atrium. 2. Congestive heart failure with mild pulmonary interstitial edema. Electronically Signed   By: Maisie Fus  Register   On: 08/23/2016 10:00   Dg Knee Right Port  Result Date: 08/26/2016 CLINICAL DATA:  Knee pain without trauma EXAM: PORTABLE RIGHT KNEE - 1-2 VIEW COMPARISON:  None. FINDINGS: Vascular calcifications. Degenerative changes in the patellofemoral compartment. No fracture, dislocation, or joint effusion. IMPRESSION: No acute abnormalities. Electronically Signed   By: Gerome Sam III M.D   On: 08/26/2016 18:03    Assessment/Plan  Physical deconditioning From generalized weakness. Will need for patient to work with physical therapy and occupational therapy to help regain her strength and balance.  Acute on chronic respiratory failure With her pneumonia from influenza. Continue and complete her course of antibiotic. To continue using oxygen by nasal  cannula. Monitor her breathing status. Continue Robitussin and bronchodilators.  Influenza Continue and complete her course of Tamiflu today. Currently on isolation precautions.  Healthcare acquired pneumonia Continue and complete her course of Levaquin today. Monitor her breathing status.  Loose stool Currently on antibiotic and this could be contribution to it. Start her on probiotic for 1 week. Monitor her electrolyte level. If loose stools persist or worsens, given her recent use of antibiotic and being in the hospital, will need to send her stool for C. difficile test.  External hemorrhoids Associated with rectal pain and itching. Will place her on suppository twice a day for 2 weeks for the hemorrhoids. Monitor clinically.  Hyponatremia Monitor BMP.  End-stage renal disease on dialysis Continue dialysis 3 days a week. Continue Renvela.  Anemia of chronic disease Monitor CBC.  Type 2 diabetes mellitus with nephropathy Check blood sugar level. Continue Lantus 40 units twice a day. Continue Humalog 6 units 3 times a day with meals.  Hypotension with dialysis Continue midodrine 10 mg twice a day. Monitor blood pressure reading.   Goals of care: short term rehabilitation   Labs/tests ordered: CBC, BMP 09/19/2016  Family/ staff Communication: reviewed care plan with patient and nursing supervisor    Oneal Grout, MD Internal Medicine [facility] [address] [place], [place] [address] Cell Phone (Monday-Friday 8 am - 5 pm): [phone] On Call: [phone] and follow prompts after 5 pm and on weekends Office Phone: [phone] Office Fax: [phone]

## 2016-09-19 LAB — CBC AND DIFFERENTIAL
HCT: 36 % (ref 36–46)
Hemoglobin: 10.8 g/dL — AB (ref 12.0–16.0)
Platelets: 131 10*3/uL — AB (ref 150–399)
WBC: 10.1 10^3/mL

## 2016-09-19 LAB — BASIC METABOLIC PANEL
BUN: 16 mg/dL (ref 4–21)
CREATININE: 4.6 mg/dL — AB (ref 0.5–1.1)
Glucose: 136 mg/dL
POTASSIUM: 3.9 mmol/L (ref 3.4–5.3)
Sodium: 139 mmol/L (ref 137–147)

## 2016-09-20 DIAGNOSIS — N186 End stage renal disease: Secondary | ICD-10-CM | POA: Diagnosis not present

## 2016-09-20 DIAGNOSIS — D631 Anemia in chronic kidney disease: Secondary | ICD-10-CM | POA: Diagnosis not present

## 2016-09-20 DIAGNOSIS — D689 Coagulation defect, unspecified: Secondary | ICD-10-CM | POA: Diagnosis not present

## 2016-09-20 DIAGNOSIS — E119 Type 2 diabetes mellitus without complications: Secondary | ICD-10-CM | POA: Diagnosis not present

## 2016-09-20 DIAGNOSIS — N2581 Secondary hyperparathyroidism of renal origin: Secondary | ICD-10-CM | POA: Diagnosis not present

## 2016-09-22 ENCOUNTER — Encounter: Payer: Self-pay | Admitting: Vascular Surgery

## 2016-09-22 DIAGNOSIS — E119 Type 2 diabetes mellitus without complications: Secondary | ICD-10-CM | POA: Diagnosis not present

## 2016-09-22 DIAGNOSIS — N186 End stage renal disease: Secondary | ICD-10-CM | POA: Diagnosis not present

## 2016-09-22 DIAGNOSIS — D631 Anemia in chronic kidney disease: Secondary | ICD-10-CM | POA: Diagnosis not present

## 2016-09-22 DIAGNOSIS — N2581 Secondary hyperparathyroidism of renal origin: Secondary | ICD-10-CM | POA: Diagnosis not present

## 2016-09-22 DIAGNOSIS — D689 Coagulation defect, unspecified: Secondary | ICD-10-CM | POA: Diagnosis not present

## 2016-09-25 DIAGNOSIS — D689 Coagulation defect, unspecified: Secondary | ICD-10-CM | POA: Diagnosis not present

## 2016-09-25 DIAGNOSIS — N2581 Secondary hyperparathyroidism of renal origin: Secondary | ICD-10-CM | POA: Diagnosis not present

## 2016-09-25 DIAGNOSIS — D631 Anemia in chronic kidney disease: Secondary | ICD-10-CM | POA: Diagnosis not present

## 2016-09-25 DIAGNOSIS — E119 Type 2 diabetes mellitus without complications: Secondary | ICD-10-CM | POA: Diagnosis not present

## 2016-09-25 DIAGNOSIS — N186 End stage renal disease: Secondary | ICD-10-CM | POA: Diagnosis not present

## 2016-09-26 DIAGNOSIS — D689 Coagulation defect, unspecified: Secondary | ICD-10-CM | POA: Diagnosis not present

## 2016-09-26 DIAGNOSIS — N2581 Secondary hyperparathyroidism of renal origin: Secondary | ICD-10-CM | POA: Diagnosis not present

## 2016-09-26 DIAGNOSIS — E119 Type 2 diabetes mellitus without complications: Secondary | ICD-10-CM | POA: Diagnosis not present

## 2016-09-26 DIAGNOSIS — N186 End stage renal disease: Secondary | ICD-10-CM | POA: Diagnosis not present

## 2016-09-26 DIAGNOSIS — D631 Anemia in chronic kidney disease: Secondary | ICD-10-CM | POA: Diagnosis not present

## 2016-09-27 ENCOUNTER — Encounter (HOSPITAL_COMMUNITY): Payer: Medicare Other

## 2016-09-27 ENCOUNTER — Ambulatory Visit: Payer: Medicare Other | Admitting: Vascular Surgery

## 2016-09-27 ENCOUNTER — Other Ambulatory Visit (HOSPITAL_COMMUNITY): Payer: Medicare Other

## 2016-09-29 DIAGNOSIS — N2581 Secondary hyperparathyroidism of renal origin: Secondary | ICD-10-CM | POA: Diagnosis not present

## 2016-09-29 DIAGNOSIS — D631 Anemia in chronic kidney disease: Secondary | ICD-10-CM | POA: Diagnosis not present

## 2016-09-29 DIAGNOSIS — E119 Type 2 diabetes mellitus without complications: Secondary | ICD-10-CM | POA: Diagnosis not present

## 2016-09-29 DIAGNOSIS — N186 End stage renal disease: Secondary | ICD-10-CM | POA: Diagnosis not present

## 2016-09-29 DIAGNOSIS — D689 Coagulation defect, unspecified: Secondary | ICD-10-CM | POA: Diagnosis not present

## 2016-10-02 DIAGNOSIS — E119 Type 2 diabetes mellitus without complications: Secondary | ICD-10-CM | POA: Diagnosis not present

## 2016-10-02 DIAGNOSIS — D689 Coagulation defect, unspecified: Secondary | ICD-10-CM | POA: Diagnosis not present

## 2016-10-02 DIAGNOSIS — N2581 Secondary hyperparathyroidism of renal origin: Secondary | ICD-10-CM | POA: Diagnosis not present

## 2016-10-02 DIAGNOSIS — D631 Anemia in chronic kidney disease: Secondary | ICD-10-CM | POA: Diagnosis not present

## 2016-10-02 DIAGNOSIS — N186 End stage renal disease: Secondary | ICD-10-CM | POA: Diagnosis not present

## 2016-10-04 DIAGNOSIS — E119 Type 2 diabetes mellitus without complications: Secondary | ICD-10-CM | POA: Diagnosis not present

## 2016-10-04 DIAGNOSIS — N186 End stage renal disease: Secondary | ICD-10-CM | POA: Diagnosis not present

## 2016-10-04 DIAGNOSIS — D631 Anemia in chronic kidney disease: Secondary | ICD-10-CM | POA: Diagnosis not present

## 2016-10-04 DIAGNOSIS — D689 Coagulation defect, unspecified: Secondary | ICD-10-CM | POA: Diagnosis not present

## 2016-10-04 DIAGNOSIS — N2581 Secondary hyperparathyroidism of renal origin: Secondary | ICD-10-CM | POA: Diagnosis not present

## 2016-10-05 ENCOUNTER — Non-Acute Institutional Stay (SKILLED_NURSING_FACILITY): Payer: Medicare Other | Admitting: Family

## 2016-10-05 DIAGNOSIS — N186 End stage renal disease: Secondary | ICD-10-CM

## 2016-10-05 DIAGNOSIS — I5032 Chronic diastolic (congestive) heart failure: Secondary | ICD-10-CM | POA: Diagnosis not present

## 2016-10-05 DIAGNOSIS — E039 Hypothyroidism, unspecified: Secondary | ICD-10-CM | POA: Diagnosis not present

## 2016-10-05 DIAGNOSIS — E1165 Type 2 diabetes mellitus with hyperglycemia: Secondary | ICD-10-CM | POA: Diagnosis not present

## 2016-10-05 DIAGNOSIS — E1122 Type 2 diabetes mellitus with diabetic chronic kidney disease: Secondary | ICD-10-CM

## 2016-10-05 DIAGNOSIS — Z992 Dependence on renal dialysis: Secondary | ICD-10-CM

## 2016-10-05 NOTE — Progress Notes (Signed)
Location:  [facility] Room Number: 706  Place of Service:  SNF (31) Provider: Dinah Ngetich FNP-C   Ginette OttoSTONEKING,[name] THOMAS, MD  Patient Care Team: Merlene LaughterHal Stoneking, MD as PCP - General Annie SableKellie Goldsborough, MD as Consulting Physician (Nephrology)  Extended Emergency Contact Information Primary Emergency Contact: Randa,Joel Address: [address]          [place], [place] Work Phone: [phone] Mobile Phone: [phone] Relation: Spouse Secondary Emergency Contact: Alwyn PeaBolling,David  [place] Mobile Phone: [phone] Relation: Brother  Code Status:  DNR  Goals of care: Advanced Directive information Advanced Directives 09/11/2016  Does Patient Have a Medical Advance Directive? Yes  Type of Advance Directive Out of facility DNR (pink MOST or yellow form)  Does patient want to make changes to medical advance directive? No - Patient declined  Would patient like information on creating a medical advance directive? No - Patient declined  Pre-existing out of facility DNR order (yellow form or pink MOST form) -     Chief Complaint  Patient presents with  . Medical Management of Chronic Issues    HPI:  Pt is a 61 y.o. female seen today at [facility] for medical management of chronic diseases. She has a medical history of Type2 DM, CHF, Hypothyroidism,Morbid obesity, ESRD on dialysis among other conditions.She is seen in her room today. She complains of constipation request for colace to be scheduled. she has a left heel pressure ulcer managed by by facility wound care Nurse. Patient refuses to use provolone boot and to elevate heels off the bed.facility Nurse reports patient refuses to be assisted out of bed. Patient states fears to be left on wheelchair for too long. Facility Nurse notified patient to be assisted out of bed then notify staff whenever she needs to get back to bed.       Past  Medical History:  Diagnosis Date  . Anemia   . Anxiety   . Arthritis    "knees, feet, hands" (08/23/2016)  . Asthma   . CAD, NATIVE VESSEL    May 10, 2010 cath showed a hyperdynamic LV function, she had dominant circumflex anatomy with a 70-80% small OM1. She had diffuse diabetic plaque particularly in the distal LAD. She nondominant RCA.  Nondominant  . Cellulitis 10/15/2013  . CHF (congestive heart failure) (HCC)    Preserved EF  . Chronic bronchitis (HCC)    "probably once/ yr" (08/23/2016)  . Complication of anesthesia    "I've had difficulty waking up" (08/23/2016)  . COPD   . Depression   . Diabetic neuropathy (HCC)   . Endometrial hyperplasia   . ESRD (end stage renal disease) on dialysis (HCC)    "[facility]; MWF; [facility]" (08/23/2016)  . GERD (gastroesophageal reflux disease)   . History of hiatal hernia   . HYPERLIPIDEMIA   . HYPERTENSION   . Hypothyroidism   . Myocardial infarct, old   . On home oxygen therapy    "2L; 24/7" (08/23/2016)  . Paralyzed vocal cords   . Peripheral neuropathy (HCC)    hx/notes 01/27/2010  . Pneumonia "several times"  . Proliferative retinopathy    hx/notes 01/27/2010  . PVD    CEA  . Restless leg syndrome    "mostly on the right" (08/23/2016)  . Sleep apnea    not on cpap    . 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 Type II diabetes mellitus (HCC)    Past Surgical History:  Procedure Laterality Date  . AV FISTULA PLACEMENT Right 07/08/2015   Procedure: EXPLORATION RIGHT AXILLARY ARTERY AND RIGHT BRACHIAL VEIN;  Surgeon: Chuck Hint, MD;  Location: [facility] OR;  Service: Vascular;  Laterality: Right;  . CAROTID ENDARTERECTOMY Left X 2  . CATARACT EXTRACTION W/ INTRAOCULAR LENS  IMPLANT, BILATERAL Bilateral   . CERVICAL BIOPSY  ~ 2015   "precancerous cells"  . COLONOSCOPY    . EYE SURGERY Bilateral     numerous surgeries  . INSERTION OF DIALYSIS CATHETER N/A 06/24/2015   Procedure: ULTRASOUND BILATERAL INTERNAL JUGULAR VEIN INSERTION OF DIALYSIS CATHETER LEFT INTERNAL JUGULAR VEIN ;  Surgeon: Pryor Ochoa, MD;  Location: [facility] OR;  Service: Vascular;  Laterality: N/A;  . INTRAUTERINE DEVICE INSERTION  ~ 2015  . VITRECTOMY Bilateral     Allergies  Allergen Reactions  . Ciprofloxacin Itching  . Epinephrine     Increased heart rate    Allergies as of 10/05/2016      Reactions   Ciprofloxacin Itching   Epinephrine    Increased heart rate      Medication List       Accurate as of 10/05/16  5:36 PM. Always use your most recent med list.          acetaminophen 325 MG tablet Commonly known as:  TYLENOL Take 650 mg by mouth every 6 (six) hours as needed for mild pain.   aspirin EC 81 MG tablet Take 81 mg by mouth daily.   docusate sodium 100 MG capsule Commonly known as:  COLACE Take 100 mg by mouth daily as needed for mild constipation.   feeding supplement (PRO-STAT SUGAR FREE 64) Liqd Take 30 mLs by mouth daily.   guaiFENesin-dextromethorphan 100-10 MG/5ML syrup Commonly known as:  ROBITUSSIN DM Take 10 mLs by mouth every 6 (six) hours as needed for cough.   hydrocerin Crea Apply 1 application topically 2 (two) times daily.   insulin glargine 100 UNIT/ML injection Commonly known as:  LANTUS Inject 0.4 mLs (40 Units total) into the skin 2 (two) times daily.   insulin lispro 100 UNIT/ML injection Commonly known as:  HUMALOG Inject 6 Units into the skin 3 (three) times daily before meals.   ipratropium-albuterol 0.5-2.5 (3) MG/3ML Soln Commonly known as:  DUONEB Take 3 mLs by nebulization every 8 (eight) hours as needed.   levonorgestrel 20 MCG/24HR IUD Commonly known as:  MIRENA 1 Intra Uterine Device (1 each total) by Intrauterine route once.   levothyroxine 75 MCG tablet Commonly known as:  SYNTHROID, LEVOTHROID Take 75 mcg by mouth daily before  breakfast.   midodrine 10 MG tablet Commonly known as:  PROAMATINE Take 1 tablet (10 mg total) by mouth 2 (two) times daily with a meal.   multivitamin Tabs tablet Take 1 tablet by mouth daily.   OXYGEN 2lpm 24/7- AHC   sevelamer carbonate 2.4 g Pack Commonly known as:  RENVELA Take 2.4 g by mouth 3 (three) times daily with meals.   sodium chloride 0.65 % Soln nasal spray Commonly known as:  OCEAN Place 1 spray into both nostrils as needed for congestion.       Review of Systems  Constitutional: Negative for activity change, appetite change, chills, fatigue and fever.  HENT: Negative for congestion, rhinorrhea, sinus pain, sinus pressure, sneezing and sore throat.  Eyes: Negative.   Respiratory: Negative for cough, chest tightness, shortness of breath and wheezing.   Cardiovascular: Negative for chest pain, palpitations and leg swelling.  Gastrointestinal: Negative for abdominal distention, abdominal pain, constipation, diarrhea, nausea and vomiting.  Endocrine: Negative.   Genitourinary: Negative for dysuria, flank pain, frequency and urgency.  Musculoskeletal: Positive for gait problem.  Skin: Negative for color change, pallor and rash.       Left heel pressure ulcer  Neurological: Negative for dizziness, seizures, syncope, light-headedness and headaches.  Hematological: Does not bruise/bleed easily.  Psychiatric/Behavioral: Negative for agitation, confusion, hallucinations and sleep disturbance. The patient is not nervous/anxious.     Immunization History  Administered Date(s) Administered  . Influenza Split 06/12/2015  . Influenza-Unspecified 05/12/2014  . Pneumococcal Conjugate-13 07/12/2014   Pertinent  Health Maintenance Due  Topic Date Due  . FOOT EXAM  01/18/1965  . OPHTHALMOLOGY EXAM  01/18/1965  . URINE MICROALBUMIN  01/18/1965  . PAP SMEAR  01/19/1976  . COLONOSCOPY  01/18/2005  . MAMMOGRAM  12/15/2011  . INFLUENZA VACCINE  04/11/2016  . HEMOGLOBIN  A1C  02/21/2017    Vitals:   10/05/16 1130  BP: 122/64  Pulse: 71  Resp: 18  Temp: 97.8 F (36.6 C)  SpO2: 98%  Weight: 275 lb (124.7 kg)  Height: 5\' 4"  (1.626 m)   Body mass index is 47.2 kg/m. Physical Exam  Constitutional: She is oriented to person, place, and time.  Morbid obese  HENT:  Head: Normocephalic.  Mouth/Throat: Oropharynx is clear and moist. No oropharyngeal exudate.  Eyes: Conjunctivae and EOM are normal. Pupils are equal, round, and reactive to light. Right eye exhibits no discharge. Left eye exhibits no discharge. No scleral icterus.  Neck: Normal range of motion. No JVD present. No thyromegaly present.  Cardiovascular: Normal rate, regular rhythm, normal heart sounds and intact distal pulses.  Exam reveals no gallop and no friction rub.   No murmur heard. Pulmonary/Chest: Breath sounds normal. No respiratory distress. She has no wheezes. She has no rales.  Abdominal: Soft. Bowel sounds are normal. She exhibits no distension. There is no tenderness. There is no rebound and no guarding.  Musculoskeletal: She exhibits no edema, tenderness or deformity.  Moves x 4 extremities unsteady gait.   Lymphadenopathy:    She has no cervical adenopathy.  Neurological: She is oriented to person, place, and time.  Skin: Skin is warm and dry. No rash noted. No erythema. No pallor.  Left heel ulcer without any signs of infections.   Psychiatric: She has a normal mood and affect.    Labs reviewed:  Recent Labs  08/30/16 0830  09/12/16 0004 09/13/16 0730 09/14/16 0531  NA 129*  < > 133* 136 133*  K 3.8  < > 3.9 3.4* 3.9  CL 94*  < > 95* 98* 97*  CO2 22  < > 22 25 26   GLUCOSE 230*  < > 325* 176* 158*  BUN 29*  < > 16 27* 17  CREATININE 5.65*  < > 4.74* 6.68* 4.45*  CALCIUM 8.9  < > 9.1 9.2 9.2  PHOS 7.2*  --   --  5.0* 3.4  < > = values in this interval not displayed.  Recent Labs  08/23/16 0940  08/24/16 0329  08/30/16 0830 09/13/16 0730 09/14/16 0531   AST 15  --  13*  --   --   --   --   ALT 22  --  16  --   --   --   --  ALKPHOS 182*  --  156*  --   --   --   --   BILITOT 0.8  --  0.6  --   --   --   --   PROT 8.8*  --  7.4  --   --   --   --   ALBUMIN 3.3*  < > 2.5*  < > 2.2* 2.5* 2.4*  < > = values in this interval not displayed.  Recent Labs  08/23/16 0940  09/06/16 09/11/16 1415 09/13/16 0730 09/14/16 0531  WBC 31.0*  < > 12.9 9.6 10.4 9.3  NEUTROABS 29.2*  --  11 7.2  --   --   HGB 10.9*  < > 10.5* 11.4* 10.8* 11.1*  HCT 32.9*  < > 34* 36.8 34.9* 36.1  MCV 102.2*  < >  --  107.3* 108.4* 106.8*  PLT 212  < > 189 159 150 145*  < > = values in this interval not displayed. Lab Results  Component Value Date   TSH 9.120 (H) 06/24/2014   Lab Results  Component Value Date   HGBA1C 9.9 (H) 08/23/2016    Assessment/Plan 1. Uncontrolled type 2 diabetes mellitus with chronic kidney disease on chronic dialysis  Will obtain Hgb A1C 10/06/2016  2. Hypothyroidism, unspecified type Continue on Levothyroxine. TSH level 10/06/2016.   3. Chronic diastolic CHF (congestive heart failure) Stable. No edema, weight changes or shortness of breath.continue to monitor weight.    4. Left heel Pressure ulcer  Stable. Afebrile. No signs of infections. Continue with wound care. Encourage to elevate heel off the bed.    Family/ staff Communication: Reviewed plan of care with patient and facility Nurse supervisor.   Labs/tests ordered:  Hgb A1C and TSH level 10/06/2016.

## 2016-10-06 DIAGNOSIS — N2581 Secondary hyperparathyroidism of renal origin: Secondary | ICD-10-CM | POA: Diagnosis not present

## 2016-10-06 DIAGNOSIS — E119 Type 2 diabetes mellitus without complications: Secondary | ICD-10-CM | POA: Diagnosis not present

## 2016-10-06 DIAGNOSIS — N186 End stage renal disease: Secondary | ICD-10-CM | POA: Diagnosis not present

## 2016-10-06 DIAGNOSIS — D631 Anemia in chronic kidney disease: Secondary | ICD-10-CM | POA: Diagnosis not present

## 2016-10-06 DIAGNOSIS — D689 Coagulation defect, unspecified: Secondary | ICD-10-CM | POA: Diagnosis not present

## 2016-10-06 LAB — BASIC METABOLIC PANEL
BUN: 24 mg/dL — AB (ref 4–21)
Creatinine: 4.9 mg/dL — AB (ref 0.5–1.1)
Sodium: 134 mmol/L — AB (ref 137–147)

## 2016-10-06 LAB — HEPATIC FUNCTION PANEL
ALT: 19 U/L (ref 7–35)
AST: 26 U/L (ref 13–35)
BILIRUBIN, TOTAL: 0.5 mg/dL

## 2016-10-06 LAB — TSH: TSH: 6.05 u[IU]/mL — AB (ref 0.41–5.90)

## 2016-10-06 LAB — HEMOGLOBIN A1C: HEMOGLOBIN A1C: 6.6

## 2016-10-06 LAB — CBC AND DIFFERENTIAL
HEMATOCRIT: 39 % (ref 36–46)
HEMOGLOBIN: 12.2 g/dL (ref 12.0–16.0)
PLATELETS: 170 10*3/uL (ref 150–399)

## 2016-10-09 DIAGNOSIS — D631 Anemia in chronic kidney disease: Secondary | ICD-10-CM | POA: Diagnosis not present

## 2016-10-09 DIAGNOSIS — N186 End stage renal disease: Secondary | ICD-10-CM | POA: Diagnosis not present

## 2016-10-09 DIAGNOSIS — N2581 Secondary hyperparathyroidism of renal origin: Secondary | ICD-10-CM | POA: Diagnosis not present

## 2016-10-09 DIAGNOSIS — E119 Type 2 diabetes mellitus without complications: Secondary | ICD-10-CM | POA: Diagnosis not present

## 2016-10-09 DIAGNOSIS — D689 Coagulation defect, unspecified: Secondary | ICD-10-CM | POA: Diagnosis not present

## 2016-10-10 ENCOUNTER — Non-Acute Institutional Stay (SKILLED_NURSING_FACILITY): Payer: Medicare Other | Admitting: Family

## 2016-10-10 DIAGNOSIS — E039 Hypothyroidism, unspecified: Secondary | ICD-10-CM | POA: Diagnosis not present

## 2016-10-10 DIAGNOSIS — K5901 Slow transit constipation: Secondary | ICD-10-CM

## 2016-10-10 MED ORDER — DOCUSATE SODIUM 100 MG PO CAPS: 100.0000 mg | ORAL_CAPSULE | Freq: Every day | ORAL | Status: DC

## 2016-10-10 MED ORDER — LEVOTHYROXINE SODIUM 88 MCG PO TABS: 88.0000 ug | ORAL_TABLET | Freq: Every day | ORAL | Status: AC

## 2016-10-10 NOTE — Progress Notes (Signed)
Location:  [facility] Room Number: 706 Place of Service:  SNF (31) Provider: Jeancarlos Marchena FNP-C   Ginette OttoSTONEKING,[name] THOMAS, MD  Patient Care Team: Merlene LaughterHal Stoneking, MD as PCP - General Annie SableKellie Goldsborough, MD as Consulting Physician (Nephrology)  Extended Emergency Contact Information Primary Emergency Contact: Tubbs,Joel Address: [address]          [place], [place] Work Phone: [phone] Mobile Phone: [phone] Relation: Spouse Secondary Emergency Contact: Alwyn PeaBolling,David  [place] Mobile Phone: [phone] Relation: Brother  Code Status:  DNR  Goals of care: Advanced Directive information Advanced Directives 09/11/2016  Does Patient Have a Medical Advance Directive? Yes  Type of Advance Directive Out of facility DNR (pink MOST or yellow form)  Does patient want to make changes to medical advance directive? No - Patient declined  Would patient like information on creating a medical advance directive? No - Patient declined  Pre-existing out of facility DNR order (yellow form or pink MOST form) -     Chief Complaint  Patient presents with  . Acute Visit    abnormal lab results     HPI:  Pt is a 61 y.o. female [facility] at [facility] for evaluation of abnormal lab results. She has a medical history of Type2 DM, CHF, Hypothyroidism,Morbid obesity, ESRD among other conditions.She is seen in her room today. She complains of constipation request colace to be scheduled instead of as needed. She states colace works well for her no suppository for now. Her sacral ulcer progressively healing well and left heel unstagable ulcer managed by wound care Nurse.    Past Medical History:  Diagnosis Date  . Anemia   . Anxiety   . Arthritis    "knees, feet, hands" (08/23/2016)  . Asthma   . CAD, NATIVE VESSEL    May 10, 2010 cath showed a hyperdynamic LV function, she had dominant  circumflex anatomy with a 70-80% small OM1. She had diffuse diabetic plaque particularly in the distal LAD. She nondominant RCA.  Nondominant  . Cellulitis 10/15/2013  . CHF (congestive heart failure) (HCC)    Preserved EF  . Chronic bronchitis (HCC)    "probably once/ yr" (08/23/2016)  . Complication of anesthesia    "I've had difficulty waking up" (08/23/2016)  . COPD   . Depression   . Diabetic neuropathy (HCC)   . Endometrial hyperplasia   . ESRD (end stage renal disease) on dialysis (HCC)    "[facility]; [facility]WF; [facility]" (08/23/2016)  . GERD (gastroesophageal reflux disease)   . History of hiatal hernia   . HYPERLIPIDEMIA   . HYPERTENSION   . Hypothyroidism   . Myocardial infarct, old   . On home oxygen therapy    "2L; 24/7" (08/23/2016)  . Paralyzed vocal cords   . Peripheral neuropathy (HCC)    hx/notes 01/27/2010  . Pneumonia "several times"  . Proliferative retinopathy    hx/notes 01/27/2010  . PVD    CEA  . Restless leg syndrome    "mostly on the right" (08/23/2016)  . Sleep apnea    not on cpap    . 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 Type II diabetes mellitus (HCC)    Past Surgical History:  Procedure Laterality Date  . AV FISTULA PLACEMENT Right 07/08/2015   Procedure: EXPLORATION  RIGHT AXILLARY ARTERY AND RIGHT BRACHIAL VEIN;  Surgeon: Chuck Hint, MD;  Location: [facility] OR;  Service: Vascular;  Laterality: Right;  . CAROTID ENDARTERECTOMY Left X 2  . CATARACT EXTRACTION W/ INTRAOCULAR LENS  IMPLANT, BILATERAL Bilateral   . CERVICAL BIOPSY  ~ 2015   "precancerous cells"  . COLONOSCOPY    . EYE SURGERY Bilateral    numerous surgeries  . INSERTION OF DIALYSIS CATHETER N/A 06/24/2015   Procedure: ULTRASOUND BILATERAL INTERNAL JUGULAR VEIN INSERTION OF DIALYSIS CATHETER LEFT INTERNAL JUGULAR VEIN ;  Surgeon: Pryor Ochoa, MD;   Location: [facility] OR;  Service: Vascular;  Laterality: N/A;  . INTRAUTERINE DEVICE INSERTION  ~ 2015  . VITRECTOMY Bilateral     Allergies  Allergen Reactions  . Ciprofloxacin Itching  . Epinephrine     Increased heart rate    Allergies as of 10/10/2016      Reactions   Ciprofloxacin Itching   Epinephrine    Increased heart rate      Medication List       Accurate as of 10/10/16  4:32 PM. Always use your most recent med list.          acetaminophen 325 MG tablet Commonly known as:  TYLENOL Take 650 mg by mouth every 6 (six) hours as needed for mild pain.   aspirin EC 81 MG tablet Take 81 mg by mouth daily.   docusate sodium 100 MG capsule Commonly known as:  COLACE Take 1 capsule (100 mg total) by mouth daily.   feeding supplement (PRO-STAT SUGAR FREE 64) Liqd Take 30 mLs by mouth daily.   guaiFENesin-dextromethorphan 100-10 MG/5ML syrup Commonly known as:  ROBITUSSIN DM Take 10 mLs by mouth every 6 (six) hours as needed for cough.   hydrocerin Crea Apply 1 application topically 2 (two) times daily.   insulin glargine 100 UNIT/ML injection Commonly known as:  LANTUS Inject 0.4 mLs (40 Units total) into the skin 2 (two) times daily.   insulin lispro 100 UNIT/ML injection Commonly known as:  HUMALOG Inject 6 Units into the skin 3 (three) times daily before meals.   ipratropium-albuterol 0.5-2.5 (3) MG/3ML Soln Commonly known as:  DUONEB Take 3 mLs by nebulization every 8 (eight) hours as needed.   levonorgestrel 20 MCG/24HR IUD Commonly known as:  MIRENA 1 Intra Uterine Device (1 each total) by Intrauterine route once.   levothyroxine 88 MCG tablet Commonly known as:  SYNTHROID, LEVOTHROID Take 1 tablet (88 mcg total) by mouth daily before breakfast.   midodrine 10 MG tablet Commonly known as:  PROAMATINE Take 1 tablet (10 mg total) by mouth 2 (two) times daily with a meal.   multivitamin Tabs tablet Take 1 tablet by mouth daily.   OXYGEN 2lpm 24/7-  AHC   sevelamer carbonate 2.4 g Pack Commonly known as:  RENVELA Take 2.4 g by mouth 3 (three) times daily with meals.   sodium chloride 0.65 % Soln nasal spray Commonly known as:  OCEAN Place 1 spray into both nostrils as needed for congestion.       Review of Systems  Constitutional: Negative for activity change, appetite change, chills, fatigue and fever.  HENT: Negative for congestion, rhinorrhea, sinus pain, sinus pressure, sneezing and sore throat.   Eyes: Negative.   Respiratory: Negative for cough, chest tightness, shortness of breath and wheezing.   Cardiovascular: Negative for chest pain, palpitations and leg swelling.  Gastrointestinal: Negative for abdominal distention, abdominal pain, constipation, diarrhea, nausea and vomiting.  Endocrine: Negative for cold intolerance, heat intolerance, polydipsia, polyphagia and polyuria.  Genitourinary:       ESRD   Musculoskeletal: Positive for gait problem.  Skin: Negative for color change, pallor and rash.       Sacral and left heel ulcer   Neurological: Negative for dizziness, seizures, syncope, light-headedness and headaches.  Psychiatric/Behavioral: Negative for agitation, confusion, hallucinations and sleep disturbance. The patient is not nervous/anxious.     Immunization History  Administered Date(s) Administered  . Influenza Split 06/12/2015  . Influenza-Unspecified 05/12/2014  . Pneumococcal Conjugate-13 07/12/2014   Pertinent  Health Maintenance Due  Topic Date Due  . FOOT EXAM  01/18/1965  . OPHTHALMOLOGY EXAM  01/18/1965  . URINE MICROALBUMIN  01/18/1965  . PAP SMEAR  01/19/1976  . COLONOSCOPY  01/18/2005  . MAMMOGRAM  12/15/2011  . INFLUENZA VACCINE  04/11/2016  . HEMOGLOBIN A1C  02/21/2017    Vitals:   10/10/16 1000  BP: 129/69  Pulse: 89  Resp: 18  Temp: 98.7 F (37.1 C)  SpO2: 96%  Weight: 265 lb 11.2 oz (120.5 kg)  Height: 5\' 4"  (1.626 m)   Body mass index is 45.61 kg/m. Physical Exam    Constitutional: She is oriented to person, place, and time.  Morbid obese in no acute distress.   HENT:  Head: Normocephalic.  Mouth/Throat: Oropharynx is clear and moist. No oropharyngeal exudate.  Eyes: Conjunctivae and EOM are normal. Pupils are equal, round, and reactive to light. Right eye exhibits no discharge. Left eye exhibits no discharge. No scleral icterus.  Neck: Normal range of motion. No JVD present. No thyromegaly present.  Cardiovascular: Normal rate, regular rhythm and intact distal pulses.  Exam reveals no gallop and no friction rub.   No murmur heard. Pulmonary/Chest: Effort normal and breath sounds normal. No respiratory distress. She has no wheezes. She has no rales.  Abdominal: Soft. Bowel sounds are normal. She exhibits no distension. There is no tenderness. There is no rebound and no guarding.  Musculoskeletal: She exhibits no tenderness or deformity.  Moves x 4 extremities.  Lymphadenopathy:    She has no cervical adenopathy.  Neurological: She is oriented to person, place, and time.  Skin: Skin is warm and dry. No rash noted. No erythema. No pallor.  1. Sacral ulcer; wound has closed up. progressive healing. Surrounding skin tissue without any signs of infections.   2. Left Heel; unstagable black eschar. No signs of infections.   Psychiatric: She has a normal mood and affect.    Labs reviewed:  Recent Labs  08/30/16 0830  09/12/16 0004 09/13/16 0730 09/14/16 0531 10/06/16  NA 129*  < > 133* 136 133* 134*  K 3.8  < > 3.9 3.4* 3.9  --   CL 94*  < > 95* 98* 97*  --   CO2 22  < > 22 25 26   --   GLUCOSE 230*  < > 325* 176* 158*  --   BUN 29*  < > 16 27* 17 24*  CREATININE 5.65*  < > 4.74* 6.68* 4.45* 4.9*  CALCIUM 8.9  < > 9.1 9.2 9.2  --   PHOS 7.2*  --   --  5.0* 3.4  --   < > = values in this interval not displayed.  Recent Labs  08/23/16 0940  08/24/16 0329  08/30/16 0830 09/13/16 0730 09/14/16 0531 10/06/16  AST 15  --  13*  --   --   --    --  26  ALT 22  --  16  --   --   --   --  19  ALKPHOS 182*  --  156*  --   --   --   --   --   BILITOT 0.8  --  0.6  --   --   --   --   --   PROT 8.8*  --  7.4  --   --   --   --   --   ALBUMIN 3.3*  < > 2.5*  < > 2.2* 2.5* 2.4*  --   < > = values in this interval not displayed.  Recent Labs  08/23/16 0940  09/06/16 09/11/16 1415 09/13/16 0730 09/14/16 0531 10/06/16  WBC 31.0*  < > 12.9 9.6 10.4 9.3  --   NEUTROABS 29.2*  --  11 7.2  --   --   --   HGB 10.9*  < > 10.5* 11.4* 10.8* 11.1* 12.2  HCT 32.9*  < > 34* 36.8 34.9* 36.1 39  MCV 102.2*  < >  --  107.3* 108.4* 106.8*  --   PLT 212  < > 189 159 150 145* 170  < > = values in this interval not displayed. Lab Results  Component Value Date   TSH 6.05 (A) 10/06/2016   Lab Results  Component Value Date   HGBA1C 6.6 10/06/2016    Assessment/Plan  Hypothyroidism, unspecified type  TSH level 6.045 ( 10/06/2016).increase Levothyroxine to 88 mcg tablet daily on an empty stomach.  Constipation  Changed colace 100 mg capsule daily per patient's request. Continue to monitor. Will change to twice daily if not effective.      Family/ staff Communication: Reviewed plan of care with patient and facility Nurse supervisor.   Labs/tests ordered:  TSH level in 8 weeks.

## 2016-10-11 DIAGNOSIS — D631 Anemia in chronic kidney disease: Secondary | ICD-10-CM | POA: Diagnosis not present

## 2016-10-11 DIAGNOSIS — I129 Hypertensive chronic kidney disease with stage 1 through stage 4 chronic kidney disease, or unspecified chronic kidney disease: Secondary | ICD-10-CM | POA: Diagnosis not present

## 2016-10-11 DIAGNOSIS — N186 End stage renal disease: Secondary | ICD-10-CM | POA: Diagnosis not present

## 2016-10-11 DIAGNOSIS — N2581 Secondary hyperparathyroidism of renal origin: Secondary | ICD-10-CM | POA: Diagnosis not present

## 2016-10-11 DIAGNOSIS — Z992 Dependence on renal dialysis: Secondary | ICD-10-CM | POA: Diagnosis not present

## 2016-10-11 DIAGNOSIS — D689 Coagulation defect, unspecified: Secondary | ICD-10-CM | POA: Diagnosis not present

## 2016-10-11 DIAGNOSIS — E119 Type 2 diabetes mellitus without complications: Secondary | ICD-10-CM | POA: Diagnosis not present

## 2016-10-13 DIAGNOSIS — D631 Anemia in chronic kidney disease: Secondary | ICD-10-CM | POA: Diagnosis not present

## 2016-10-13 DIAGNOSIS — N2581 Secondary hyperparathyroidism of renal origin: Secondary | ICD-10-CM | POA: Diagnosis not present

## 2016-10-13 DIAGNOSIS — N186 End stage renal disease: Secondary | ICD-10-CM | POA: Diagnosis not present

## 2016-10-13 DIAGNOSIS — D509 Iron deficiency anemia, unspecified: Secondary | ICD-10-CM | POA: Diagnosis not present

## 2016-10-13 DIAGNOSIS — D689 Coagulation defect, unspecified: Secondary | ICD-10-CM | POA: Diagnosis not present

## 2016-10-13 DIAGNOSIS — E119 Type 2 diabetes mellitus without complications: Secondary | ICD-10-CM | POA: Diagnosis not present

## 2016-10-16 DIAGNOSIS — D509 Iron deficiency anemia, unspecified: Secondary | ICD-10-CM | POA: Diagnosis not present

## 2016-10-16 DIAGNOSIS — D631 Anemia in chronic kidney disease: Secondary | ICD-10-CM | POA: Diagnosis not present

## 2016-10-16 DIAGNOSIS — E119 Type 2 diabetes mellitus without complications: Secondary | ICD-10-CM | POA: Diagnosis not present

## 2016-10-16 DIAGNOSIS — N2581 Secondary hyperparathyroidism of renal origin: Secondary | ICD-10-CM | POA: Diagnosis not present

## 2016-10-16 DIAGNOSIS — N186 End stage renal disease: Secondary | ICD-10-CM | POA: Diagnosis not present

## 2016-10-16 DIAGNOSIS — D689 Coagulation defect, unspecified: Secondary | ICD-10-CM | POA: Diagnosis not present

## 2016-10-18 DIAGNOSIS — D631 Anemia in chronic kidney disease: Secondary | ICD-10-CM | POA: Diagnosis not present

## 2016-10-18 DIAGNOSIS — D689 Coagulation defect, unspecified: Secondary | ICD-10-CM | POA: Diagnosis not present

## 2016-10-18 DIAGNOSIS — N2581 Secondary hyperparathyroidism of renal origin: Secondary | ICD-10-CM | POA: Diagnosis not present

## 2016-10-18 DIAGNOSIS — D509 Iron deficiency anemia, unspecified: Secondary | ICD-10-CM | POA: Diagnosis not present

## 2016-10-18 DIAGNOSIS — N186 End stage renal disease: Secondary | ICD-10-CM | POA: Diagnosis not present

## 2016-10-18 DIAGNOSIS — E119 Type 2 diabetes mellitus without complications: Secondary | ICD-10-CM | POA: Diagnosis not present

## 2016-10-20 DIAGNOSIS — D509 Iron deficiency anemia, unspecified: Secondary | ICD-10-CM | POA: Diagnosis not present

## 2016-10-20 DIAGNOSIS — D631 Anemia in chronic kidney disease: Secondary | ICD-10-CM | POA: Diagnosis not present

## 2016-10-20 DIAGNOSIS — N186 End stage renal disease: Secondary | ICD-10-CM | POA: Diagnosis not present

## 2016-10-20 DIAGNOSIS — E119 Type 2 diabetes mellitus without complications: Secondary | ICD-10-CM | POA: Diagnosis not present

## 2016-10-20 DIAGNOSIS — D689 Coagulation defect, unspecified: Secondary | ICD-10-CM | POA: Diagnosis not present

## 2016-10-20 DIAGNOSIS — N2581 Secondary hyperparathyroidism of renal origin: Secondary | ICD-10-CM | POA: Diagnosis not present

## 2016-10-23 DIAGNOSIS — N186 End stage renal disease: Secondary | ICD-10-CM | POA: Diagnosis not present

## 2016-10-23 DIAGNOSIS — D689 Coagulation defect, unspecified: Secondary | ICD-10-CM | POA: Diagnosis not present

## 2016-10-23 DIAGNOSIS — D631 Anemia in chronic kidney disease: Secondary | ICD-10-CM | POA: Diagnosis not present

## 2016-10-23 DIAGNOSIS — E119 Type 2 diabetes mellitus without complications: Secondary | ICD-10-CM | POA: Diagnosis not present

## 2016-10-23 DIAGNOSIS — N2581 Secondary hyperparathyroidism of renal origin: Secondary | ICD-10-CM | POA: Diagnosis not present

## 2016-10-23 DIAGNOSIS — D509 Iron deficiency anemia, unspecified: Secondary | ICD-10-CM | POA: Diagnosis not present

## 2016-10-25 DIAGNOSIS — N2581 Secondary hyperparathyroidism of renal origin: Secondary | ICD-10-CM | POA: Diagnosis not present

## 2016-10-25 DIAGNOSIS — D631 Anemia in chronic kidney disease: Secondary | ICD-10-CM | POA: Diagnosis not present

## 2016-10-25 DIAGNOSIS — D689 Coagulation defect, unspecified: Secondary | ICD-10-CM | POA: Diagnosis not present

## 2016-10-25 DIAGNOSIS — N186 End stage renal disease: Secondary | ICD-10-CM | POA: Diagnosis not present

## 2016-10-25 DIAGNOSIS — D509 Iron deficiency anemia, unspecified: Secondary | ICD-10-CM | POA: Diagnosis not present

## 2016-10-25 DIAGNOSIS — E119 Type 2 diabetes mellitus without complications: Secondary | ICD-10-CM | POA: Diagnosis not present

## 2016-10-27 DIAGNOSIS — D509 Iron deficiency anemia, unspecified: Secondary | ICD-10-CM | POA: Diagnosis not present

## 2016-10-27 DIAGNOSIS — D631 Anemia in chronic kidney disease: Secondary | ICD-10-CM | POA: Diagnosis not present

## 2016-10-27 DIAGNOSIS — D689 Coagulation defect, unspecified: Secondary | ICD-10-CM | POA: Diagnosis not present

## 2016-10-27 DIAGNOSIS — N186 End stage renal disease: Secondary | ICD-10-CM | POA: Diagnosis not present

## 2016-10-27 DIAGNOSIS — N2581 Secondary hyperparathyroidism of renal origin: Secondary | ICD-10-CM | POA: Diagnosis not present

## 2016-10-27 DIAGNOSIS — E119 Type 2 diabetes mellitus without complications: Secondary | ICD-10-CM | POA: Diagnosis not present

## 2016-10-30 DIAGNOSIS — E119 Type 2 diabetes mellitus without complications: Secondary | ICD-10-CM | POA: Diagnosis not present

## 2016-10-30 DIAGNOSIS — D689 Coagulation defect, unspecified: Secondary | ICD-10-CM | POA: Diagnosis not present

## 2016-10-30 DIAGNOSIS — N2581 Secondary hyperparathyroidism of renal origin: Secondary | ICD-10-CM | POA: Diagnosis not present

## 2016-10-30 DIAGNOSIS — D631 Anemia in chronic kidney disease: Secondary | ICD-10-CM | POA: Diagnosis not present

## 2016-10-30 DIAGNOSIS — D509 Iron deficiency anemia, unspecified: Secondary | ICD-10-CM | POA: Diagnosis not present

## 2016-10-30 DIAGNOSIS — N186 End stage renal disease: Secondary | ICD-10-CM | POA: Diagnosis not present

## 2016-11-01 DIAGNOSIS — N2581 Secondary hyperparathyroidism of renal origin: Secondary | ICD-10-CM | POA: Diagnosis not present

## 2016-11-01 DIAGNOSIS — D631 Anemia in chronic kidney disease: Secondary | ICD-10-CM | POA: Diagnosis not present

## 2016-11-01 DIAGNOSIS — D689 Coagulation defect, unspecified: Secondary | ICD-10-CM | POA: Diagnosis not present

## 2016-11-01 DIAGNOSIS — N186 End stage renal disease: Secondary | ICD-10-CM | POA: Diagnosis not present

## 2016-11-01 DIAGNOSIS — E119 Type 2 diabetes mellitus without complications: Secondary | ICD-10-CM | POA: Diagnosis not present

## 2016-11-01 DIAGNOSIS — D509 Iron deficiency anemia, unspecified: Secondary | ICD-10-CM | POA: Diagnosis not present

## 2016-11-02 ENCOUNTER — Non-Acute Institutional Stay (SKILLED_NURSING_FACILITY): Payer: Medicare Other | Admitting: Family

## 2016-11-02 DIAGNOSIS — E039 Hypothyroidism, unspecified: Secondary | ICD-10-CM

## 2016-11-02 DIAGNOSIS — K5901 Slow transit constipation: Secondary | ICD-10-CM | POA: Diagnosis not present

## 2016-11-02 DIAGNOSIS — I5032 Chronic diastolic (congestive) heart failure: Secondary | ICD-10-CM | POA: Diagnosis not present

## 2016-11-02 NOTE — Progress Notes (Signed)
Location:  [facility] Room Number: 706  Place of Service:  SNF (31) Provider: Dinah Ngetich FNP-C   [name], MD  Patient Care Team: Merlene Laughter, MD as PCP - General Annie Sable, MD as Consulting Physician (Nephrology)  Extended Emergency Contact Information Primary Emergency Contact: Sambrano,Joel Address: [address]          [place], [place] Work Phone: [phone] Mobile Phone: [phone] Relation: Spouse Secondary Emergency Contact: Alwyn Pea [place] Mobile Phone: [phone] Relation: Brother  Code Status:  DNR  Goals of care: Advanced Directive information Advanced Directives 09/11/2016  Does Patient Have a Medical Advance Directive? Yes  Type of Advance Directive Out of facility DNR (pink MOST or yellow form)  Does patient want to make changes to medical advance directive? No - Patient declined  Would patient like information on creating a medical advance directive? No - Patient declined  Pre-existing out of facility DNR order (yellow form or pink MOST form) -     Chief Complaint  Patient presents with  . Medical Management of Chronic Issues    HPI:  Pt is a 61 y.o. female seen today at [facility] for evaluation of abnormal lab results. She has a medical history of Type2 DM, CHF, Hypothyroidism,Morbid obesity, ESRD among other conditions.She is seen in her room today. Her left heel ulcer continues to be managed by wound care nurse progressive healing noted. She states stool still hard worried that might irritated her hemorrhoids. She denies any fever, chills, nausea or vomiting. Facility Nurse reports no new concerns this visit.    Past Medical History:  Diagnosis Date  . Anemia   . Anxiety   . Arthritis    "knees, feet, hands" (08/23/2016)  . Asthma   . CAD, NATIVE VESSEL    May 10, 2010 cath showed a hyperdynamic LV function, she  had dominant circumflex anatomy with a 70-80% small OM1. She had diffuse diabetic plaque particularly in the distal LAD. She nondominant RCA.  Nondominant  . Cellulitis 10/15/2013  . CHF (congestive heart failure) (HCC)    Preserved EF  . Chronic bronchitis (HCC)    "probably once/ yr" (08/23/2016)  . Complication of anesthesia    "I've had difficulty waking up" (08/23/2016)  . COPD   . Depression   . Diabetic neuropathy (HCC)   . Endometrial hyperplasia   . ESRD (end stage renal disease) on dialysis (HCC)    "[facility]; MWF; [facility]" (08/23/2016)  . GERD (gastroesophageal reflux disease)   . History of hiatal hernia   . HYPERLIPIDEMIA   . HYPERTENSION   . Hypothyroidism   . Myocardial infarct, old   . On home oxygen therapy    "2L; 24/7" (08/23/2016)  . Paralyzed vocal cords   . Peripheral neuropathy (HCC)    hx/notes 01/27/2010  . Pneumonia "several times"  . Proliferative retinopathy    hx/notes 01/27/2010  . PVD    CEA  . Restless leg syndrome    "mostly on the right" (08/23/2016)  . Sleep apnea    not on cpap    . 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 Type II diabetes mellitus (HCC)    Past Surgical History:  Procedure Laterality Date  . AV FISTULA PLACEMENT Right 07/08/2015   Procedure: EXPLORATION RIGHT  AXILLARY ARTERY AND RIGHT BRACHIAL VEIN;  Surgeon: Chuck Hint, MD;  Location: [facility] OR;  Service: Vascular;  Laterality: Right;  . CAROTID ENDARTERECTOMY Left X 2  . CATARACT EXTRACTION W/ INTRAOCULAR LENS  IMPLANT, BILATERAL Bilateral   . CERVICAL BIOPSY  ~ 2015   "precancerous cells"  . COLONOSCOPY    . EYE SURGERY Bilateral    numerous surgeries  . INSERTION OF DIALYSIS CATHETER N/A 06/24/2015   Procedure: ULTRASOUND BILATERAL INTERNAL JUGULAR VEIN INSERTION OF DIALYSIS CATHETER LEFT INTERNAL JUGULAR VEIN ;  Surgeon: Pryor Ochoa,  MD;  Location: [facility] OR;  Service: Vascular;  Laterality: N/A;  . INTRAUTERINE DEVICE INSERTION  ~ 2015  . VITRECTOMY Bilateral     Allergies  Allergen Reactions  . Ciprofloxacin Itching  . Epinephrine     Increased heart rate    Allergies as of 11/02/2016      Reactions   Ciprofloxacin Itching   Epinephrine    Increased heart rate      Medication List       Accurate as of 11/02/16  5:15 PM. Always use your most recent med list.          acetaminophen 325 MG tablet Commonly known as:  TYLENOL Take 650 mg by mouth every 6 (six) hours as needed for mild pain.   aspirin EC 81 MG tablet Take 81 mg by mouth daily.   docusate sodium 100 MG capsule Commonly known as:  COLACE Take 1 capsule (100 mg total) by mouth daily.   feeding supplement (PRO-STAT SUGAR FREE 64) Liqd Take 30 mLs by mouth daily.   guaiFENesin-dextromethorphan 100-10 MG/5ML syrup Commonly known as:  ROBITUSSIN DM Take 10 mLs by mouth every 6 (six) hours as needed for cough.   hydrocerin Crea Apply 1 application topically 2 (two) times daily.   insulin glargine 100 UNIT/ML injection Commonly known as:  LANTUS Inject 0.4 mLs (40 Units total) into the skin 2 (two) times daily.   insulin lispro 100 UNIT/ML injection Commonly known as:  HUMALOG Inject 6 Units into the skin 3 (three) times daily before meals.   ipratropium-albuterol 0.5-2.5 (3) MG/3ML Soln Commonly known as:  DUONEB Take 3 mLs by nebulization every 8 (eight) hours as needed.   levonorgestrel 20 MCG/24HR IUD Commonly known as:  MIRENA 1 Intra Uterine Device (1 each total) by Intrauterine route once.   levothyroxine 88 MCG tablet Commonly known as:  SYNTHROID, LEVOTHROID Take 1 tablet (88 mcg total) by mouth daily before breakfast.   midodrine 10 MG tablet Commonly known as:  PROAMATINE Take 1 tablet (10 mg total) by mouth 2 (two) times daily with a meal.   multivitamin Tabs tablet Take 1 tablet by mouth daily.   OXYGEN 2lpm  24/7- [facility]   sevelamer carbonate 2.4 g Pack Commonly known as:  RENVELA Take 2.4 g by mouth 3 (three) times daily with meals.   sodium chloride 0.65 % Soln nasal spray Commonly known as:  OCEAN Place 1 spray into both nostrils as needed for congestion.       Review of Systems  Constitutional: Negative for activity change, appetite change, chills, fatigue and fever.  HENT: Negative for congestion, rhinorrhea, sinus pain, sinus pressure, sneezing and sore throat.   Eyes: Negative.   Respiratory: Negative for cough, chest tightness, shortness of breath and wheezing.   Cardiovascular: Negative for chest pain, palpitations and leg swelling.  Gastrointestinal: Negative for abdominal distention, abdominal pain, constipation, diarrhea, nausea and vomiting.  Endocrine:  Negative for cold intolerance, heat intolerance, polydipsia, polyphagia and polyuria.  Genitourinary:       ESRD on dialysis   Musculoskeletal: Positive for gait problem.  Skin: Negative for color change, pallor and rash.        left heel ulcer   Neurological: Negative for dizziness, seizures, syncope, light-headedness and headaches.  Hematological: Does not bruise/bleed easily.  Psychiatric/Behavioral: Negative for agitation, confusion, hallucinations and sleep disturbance. The patient is not nervous/anxious.     Immunization History  Administered Date(s) Administered  . Influenza Split 06/12/2015  . Influenza-Unspecified 05/12/2014  . Pneumococcal Conjugate-13 07/12/2014   Pertinent  Health Maintenance Due  Topic Date Due  . FOOT EXAM  01/18/1965  . OPHTHALMOLOGY EXAM  01/18/1965  . URINE MICROALBUMIN  01/18/1965  . PAP SMEAR  01/19/1976  . COLONOSCOPY  01/18/2005  . MAMMOGRAM  12/15/2011  . INFLUENZA VACCINE  04/11/2016  . HEMOGLOBIN A1C  04/05/2017    Vitals:   11/02/16 1100  BP: 114/62  Pulse: 88  Resp: 16  Temp: 98.4 F (36.9 C)  SpO2: 96%  Weight: 267 lb 1.6 oz (121.2 kg)  Height: 5\' 4"  (1.626 m)     Body mass index is 45.85 kg/m. Physical Exam  Constitutional: She is oriented to person, place, and time. No distress.  Morbid obese  HENT:  Head: Normocephalic.  Mouth/Throat: Oropharynx is clear and moist. No oropharyngeal exudate.  Eyes: Conjunctivae and EOM are normal. Pupils are equal, round, and reactive to light. Right eye exhibits no discharge. Left eye exhibits no discharge. No scleral icterus.  Neck: Normal range of motion. No JVD present. No thyromegaly present.  Cardiovascular: Normal rate, regular rhythm and intact distal pulses.  Exam reveals no gallop and no friction rub.   No murmur heard. Pulmonary/Chest: Effort normal and breath sounds normal. No respiratory distress. She has no wheezes. She has no rales.  Oxygen 2 liters via [place]   Abdominal: Soft. Bowel sounds are normal. She exhibits no distension. There is no tenderness. There is no rebound and no guarding.  Musculoskeletal: She exhibits no tenderness or deformity.  Moves x 4 extremities.  Lymphadenopathy:    She has no cervical adenopathy.  Neurological: She is oriented to person, place, and time.  Skin: Skin is warm and dry. No rash noted. No erythema. No pallor.   Left Heel; progressive healing. No drainage noted. Surrounding skin without signs of infections.   Psychiatric: She has a normal mood and affect.    Labs reviewed:  Recent Labs  08/30/16 0830  09/12/16 0004 09/13/16 0730 09/14/16 0531 10/06/16  NA 129*  < > 133* 136 133* 134*  K 3.8  < > 3.9 3.4* 3.9  --   CL 94*  < > 95* 98* 97*  --   CO2 22  < > 22 25 26   --   GLUCOSE 230*  < > 325* 176* 158*  --   BUN 29*  < > 16 27* 17 24*  CREATININE 5.65*  < > 4.74* 6.68* 4.45* 4.9*  CALCIUM 8.9  < > 9.1 9.2 9.2  --   PHOS 7.2*  --   --  5.0* 3.4  --   < > = values in this interval not displayed.  Recent Labs  08/23/16 0940  08/24/16 0329  08/30/16 0830 09/13/16 0730 09/14/16 0531 10/06/16  AST 15  --  13*  --   --   --   --  26  ALT 22   --  16  --   --   --   --  19  ALKPHOS 182*  --  156*  --   --   --   --   --   BILITOT 0.8  --  0.6  --   --   --   --   --   PROT 8.8*  --  7.4  --   --   --   --   --   ALBUMIN 3.3*  < > 2.5*  < > 2.2* 2.5* 2.4*  --   < > = values in this interval not displayed.  Recent Labs  08/23/16 0940  09/06/16 09/11/16 1415 09/13/16 0730 09/14/16 0531 10/06/16  WBC 31.0*  < > 12.9 9.6 10.4 9.3  --   NEUTROABS 29.2*  --  11 7.2  --   --   --   HGB 10.9*  < > 10.5* 11.4* 10.8* 11.1* 12.2  HCT 32.9*  < > 34* 36.8 34.9* 36.1 39  MCV 102.2*  < >  --  107.3* 108.4* 106.8*  --   PLT 212  < > 189 159 150 145* 170  < > = values in this interval not displayed. Lab Results  Component Value Date   TSH 6.05 (A) 10/06/2016   Lab Results  Component Value Date   HGBA1C 6.6 10/06/2016    Assessment/Plan   Constipation  Continue on colace 100 mg capsule daily. Add senna 8.6 mg Tablet take 2 tablets by mouth daily at bedtime. Continue to monitor.    Hypothyroidism, unspecified type  stable.continue on Levothyroxine to 88 mcg tablet daily. Monitor TSH level.   CHF  Stable.Exam findings negative. Continue to monitor weight.   Family/ staff Communication: Reviewed plan of care with patient and facility Nurse supervisor.   Labs/tests ordered:  none

## 2016-11-03 DIAGNOSIS — E119 Type 2 diabetes mellitus without complications: Secondary | ICD-10-CM | POA: Diagnosis not present

## 2016-11-03 DIAGNOSIS — N2581 Secondary hyperparathyroidism of renal origin: Secondary | ICD-10-CM | POA: Diagnosis not present

## 2016-11-03 DIAGNOSIS — D631 Anemia in chronic kidney disease: Secondary | ICD-10-CM | POA: Diagnosis not present

## 2016-11-03 DIAGNOSIS — D689 Coagulation defect, unspecified: Secondary | ICD-10-CM | POA: Diagnosis not present

## 2016-11-03 DIAGNOSIS — N186 End stage renal disease: Secondary | ICD-10-CM | POA: Diagnosis not present

## 2016-11-03 DIAGNOSIS — D509 Iron deficiency anemia, unspecified: Secondary | ICD-10-CM | POA: Diagnosis not present

## 2016-11-06 DIAGNOSIS — D631 Anemia in chronic kidney disease: Secondary | ICD-10-CM | POA: Diagnosis not present

## 2016-11-06 DIAGNOSIS — N186 End stage renal disease: Secondary | ICD-10-CM | POA: Diagnosis not present

## 2016-11-06 DIAGNOSIS — D689 Coagulation defect, unspecified: Secondary | ICD-10-CM | POA: Diagnosis not present

## 2016-11-06 DIAGNOSIS — D509 Iron deficiency anemia, unspecified: Secondary | ICD-10-CM | POA: Diagnosis not present

## 2016-11-06 DIAGNOSIS — N2581 Secondary hyperparathyroidism of renal origin: Secondary | ICD-10-CM | POA: Diagnosis not present

## 2016-11-06 DIAGNOSIS — L89620 Pressure ulcer of left heel, unstageable: Secondary | ICD-10-CM | POA: Diagnosis not present

## 2016-11-06 DIAGNOSIS — E119 Type 2 diabetes mellitus without complications: Secondary | ICD-10-CM | POA: Diagnosis not present

## 2016-11-08 DIAGNOSIS — I129 Hypertensive chronic kidney disease with stage 1 through stage 4 chronic kidney disease, or unspecified chronic kidney disease: Secondary | ICD-10-CM | POA: Diagnosis not present

## 2016-11-08 DIAGNOSIS — D631 Anemia in chronic kidney disease: Secondary | ICD-10-CM | POA: Diagnosis not present

## 2016-11-08 DIAGNOSIS — Z992 Dependence on renal dialysis: Secondary | ICD-10-CM | POA: Diagnosis not present

## 2016-11-08 DIAGNOSIS — N186 End stage renal disease: Secondary | ICD-10-CM | POA: Diagnosis not present

## 2016-11-08 DIAGNOSIS — D509 Iron deficiency anemia, unspecified: Secondary | ICD-10-CM | POA: Diagnosis not present

## 2016-11-08 DIAGNOSIS — D689 Coagulation defect, unspecified: Secondary | ICD-10-CM | POA: Diagnosis not present

## 2016-11-08 DIAGNOSIS — E119 Type 2 diabetes mellitus without complications: Secondary | ICD-10-CM | POA: Diagnosis not present

## 2016-11-08 DIAGNOSIS — N2581 Secondary hyperparathyroidism of renal origin: Secondary | ICD-10-CM | POA: Diagnosis not present

## 2016-11-10 DIAGNOSIS — N2581 Secondary hyperparathyroidism of renal origin: Secondary | ICD-10-CM | POA: Diagnosis not present

## 2016-11-10 DIAGNOSIS — Z283 Underimmunization status: Secondary | ICD-10-CM | POA: Diagnosis not present

## 2016-11-10 DIAGNOSIS — D631 Anemia in chronic kidney disease: Secondary | ICD-10-CM | POA: Diagnosis not present

## 2016-11-10 DIAGNOSIS — N186 End stage renal disease: Secondary | ICD-10-CM | POA: Diagnosis not present

## 2016-11-10 DIAGNOSIS — D689 Coagulation defect, unspecified: Secondary | ICD-10-CM | POA: Diagnosis not present

## 2016-11-10 DIAGNOSIS — D509 Iron deficiency anemia, unspecified: Secondary | ICD-10-CM | POA: Diagnosis not present

## 2016-11-13 DIAGNOSIS — Z283 Underimmunization status: Secondary | ICD-10-CM | POA: Diagnosis not present

## 2016-11-13 DIAGNOSIS — D689 Coagulation defect, unspecified: Secondary | ICD-10-CM | POA: Diagnosis not present

## 2016-11-13 DIAGNOSIS — D509 Iron deficiency anemia, unspecified: Secondary | ICD-10-CM | POA: Diagnosis not present

## 2016-11-13 DIAGNOSIS — L89620 Pressure ulcer of left heel, unstageable: Secondary | ICD-10-CM | POA: Diagnosis not present

## 2016-11-13 DIAGNOSIS — D631 Anemia in chronic kidney disease: Secondary | ICD-10-CM | POA: Diagnosis not present

## 2016-11-13 DIAGNOSIS — N186 End stage renal disease: Secondary | ICD-10-CM | POA: Diagnosis not present

## 2016-11-13 DIAGNOSIS — N2581 Secondary hyperparathyroidism of renal origin: Secondary | ICD-10-CM | POA: Diagnosis not present

## 2016-11-15 DIAGNOSIS — D509 Iron deficiency anemia, unspecified: Secondary | ICD-10-CM | POA: Diagnosis not present

## 2016-11-15 DIAGNOSIS — D631 Anemia in chronic kidney disease: Secondary | ICD-10-CM | POA: Diagnosis not present

## 2016-11-15 DIAGNOSIS — N186 End stage renal disease: Secondary | ICD-10-CM | POA: Diagnosis not present

## 2016-11-15 DIAGNOSIS — N2581 Secondary hyperparathyroidism of renal origin: Secondary | ICD-10-CM | POA: Diagnosis not present

## 2016-11-15 DIAGNOSIS — D689 Coagulation defect, unspecified: Secondary | ICD-10-CM | POA: Diagnosis not present

## 2016-11-15 DIAGNOSIS — Z283 Underimmunization status: Secondary | ICD-10-CM | POA: Diagnosis not present

## 2016-11-17 DIAGNOSIS — Z283 Underimmunization status: Secondary | ICD-10-CM | POA: Diagnosis not present

## 2016-11-17 DIAGNOSIS — N2581 Secondary hyperparathyroidism of renal origin: Secondary | ICD-10-CM | POA: Diagnosis not present

## 2016-11-17 DIAGNOSIS — D689 Coagulation defect, unspecified: Secondary | ICD-10-CM | POA: Diagnosis not present

## 2016-11-17 DIAGNOSIS — D509 Iron deficiency anemia, unspecified: Secondary | ICD-10-CM | POA: Diagnosis not present

## 2016-11-17 DIAGNOSIS — N186 End stage renal disease: Secondary | ICD-10-CM | POA: Diagnosis not present

## 2016-11-17 DIAGNOSIS — D631 Anemia in chronic kidney disease: Secondary | ICD-10-CM | POA: Diagnosis not present

## 2016-11-20 DIAGNOSIS — L89620 Pressure ulcer of left heel, unstageable: Secondary | ICD-10-CM | POA: Diagnosis not present

## 2016-11-20 DIAGNOSIS — D631 Anemia in chronic kidney disease: Secondary | ICD-10-CM | POA: Diagnosis not present

## 2016-11-20 DIAGNOSIS — D689 Coagulation defect, unspecified: Secondary | ICD-10-CM | POA: Diagnosis not present

## 2016-11-20 DIAGNOSIS — Z283 Underimmunization status: Secondary | ICD-10-CM | POA: Diagnosis not present

## 2016-11-20 DIAGNOSIS — N186 End stage renal disease: Secondary | ICD-10-CM | POA: Diagnosis not present

## 2016-11-20 DIAGNOSIS — N2581 Secondary hyperparathyroidism of renal origin: Secondary | ICD-10-CM | POA: Diagnosis not present

## 2016-11-20 DIAGNOSIS — D509 Iron deficiency anemia, unspecified: Secondary | ICD-10-CM | POA: Diagnosis not present

## 2016-11-22 DIAGNOSIS — N186 End stage renal disease: Secondary | ICD-10-CM | POA: Diagnosis not present

## 2016-11-22 DIAGNOSIS — N2581 Secondary hyperparathyroidism of renal origin: Secondary | ICD-10-CM | POA: Diagnosis not present

## 2016-11-22 DIAGNOSIS — D509 Iron deficiency anemia, unspecified: Secondary | ICD-10-CM | POA: Diagnosis not present

## 2016-11-22 DIAGNOSIS — Z283 Underimmunization status: Secondary | ICD-10-CM | POA: Diagnosis not present

## 2016-11-22 DIAGNOSIS — D689 Coagulation defect, unspecified: Secondary | ICD-10-CM | POA: Diagnosis not present

## 2016-11-22 DIAGNOSIS — D631 Anemia in chronic kidney disease: Secondary | ICD-10-CM | POA: Diagnosis not present

## 2016-11-23 ENCOUNTER — Encounter: Payer: Self-pay | Admitting: Vascular Surgery

## 2016-11-24 ENCOUNTER — Encounter: Payer: Self-pay | Admitting: Family

## 2016-11-24 ENCOUNTER — Non-Acute Institutional Stay (SKILLED_NURSING_FACILITY): Payer: Medicare Other | Admitting: Family

## 2016-11-24 DIAGNOSIS — Z283 Underimmunization status: Secondary | ICD-10-CM | POA: Diagnosis not present

## 2016-11-24 DIAGNOSIS — N2581 Secondary hyperparathyroidism of renal origin: Secondary | ICD-10-CM | POA: Diagnosis not present

## 2016-11-24 DIAGNOSIS — D689 Coagulation defect, unspecified: Secondary | ICD-10-CM | POA: Diagnosis not present

## 2016-11-24 DIAGNOSIS — I5032 Chronic diastolic (congestive) heart failure: Secondary | ICD-10-CM | POA: Diagnosis not present

## 2016-11-24 DIAGNOSIS — I2510 Atherosclerotic heart disease of native coronary artery without angina pectoris: Secondary | ICD-10-CM

## 2016-11-24 DIAGNOSIS — R2681 Unsteadiness on feet: Secondary | ICD-10-CM | POA: Diagnosis not present

## 2016-11-24 DIAGNOSIS — J449 Chronic obstructive pulmonary disease, unspecified: Secondary | ICD-10-CM

## 2016-11-24 DIAGNOSIS — Z794 Long term (current) use of insulin: Secondary | ICD-10-CM

## 2016-11-24 DIAGNOSIS — E039 Hypothyroidism, unspecified: Secondary | ICD-10-CM

## 2016-11-24 DIAGNOSIS — L89600 Pressure ulcer of unspecified heel, unstageable: Secondary | ICD-10-CM | POA: Diagnosis not present

## 2016-11-24 DIAGNOSIS — D509 Iron deficiency anemia, unspecified: Secondary | ICD-10-CM | POA: Diagnosis not present

## 2016-11-24 DIAGNOSIS — N186 End stage renal disease: Secondary | ICD-10-CM

## 2016-11-24 DIAGNOSIS — E1121 Type 2 diabetes mellitus with diabetic nephropathy: Secondary | ICD-10-CM

## 2016-11-24 DIAGNOSIS — D631 Anemia in chronic kidney disease: Secondary | ICD-10-CM | POA: Diagnosis not present

## 2016-11-24 DIAGNOSIS — I950 Idiopathic hypotension: Secondary | ICD-10-CM | POA: Diagnosis not present

## 2016-11-24 NOTE — Progress Notes (Signed)
Location:  [facility] Room Number: 706 Place of Service:  SNF (31)  Provider: Richarda Blade FNP-C   PCP: [name], MD Patient Care Team: Merlene Laughter, MD as PCP - General Annie Sable, MD as Consulting Physician (Nephrology)  Extended Emergency Contact Information Primary Emergency Contact: Bartmess,Joel Address: [address]          [place], [place] Work Phone: [phone] Mobile Phone: [phone] Relation: Spouse Secondary Emergency Contact: Alwyn Pe[place] Mobile Phone: [phone] Relation: Brother  Code Status: DNR   Goals of care:  Advanced Directive information Advanced Directives 11/24/2016  Does Patient Have a Medical Advance Directive? Yes  Type of Advance Directive Out of facility DNR (pink MOST or yellow form)  Does patient want to make changes to medical advance directive? -  Would patient like information on creating a medical advance directive? -  Pre-existing out of facility DNR order (yellow form or pink MOST form) Yellow form placed in chart (order not valid for inpatient use)     Allergies  Allergen Reactions  . Ciprofloxacin Itching  . Epinephrine     Increased heart rate    Chief Complaint  Patient presents with  . Discharge Note    HPI:  61 y.o. female seen today at [facility] for discharge home. She was here for short term rehabilitation post hospital re-admission from 09/11/2016-09/14/2016 with acute respiratory failure from influenza pneumonia and hypotension.She was started on IV fluids, Tamiflu and Levaquin. Of note, patient was undergoing rehabilitation at this facility post hospital admission from 08/23/2016-08/31/2016 with acute on chronic hypoxic respiratory failure from COPD exacerbation, left lower Extremity cellulitis. She has a medical history of Type2 DM, CHF, Hypothyroidism,Morbid obesity, ESRD on  dialysis ( Monday, wed and Fridays) among other conditions.She is seen in her room today.She denies any acute issues this visit. Her left heel ulcer continues to be managed by wound care nurse progressive healing noted.  She has had unremarkable stay here at the rehab. She has worked well with PT/OT now stable for discharge home.She will be discharged home with Home health PT to continue with ROM, Exercise, Gait stability and muscle strengthening. She will also require [facility] RN for left heel ulcer wound care.She will require DME FWW to allow her to maintain current level of independence with ADL's.She will also require a Bariatric 3-1 bedside commode to  enable her to safely and independently perform toileting transfer in home due to her unsteady gait and morbid obesity.   Home health services will be arranged by facility social worker prior to discharge. Prescription medication will be written x 1 month then patient to follow up with PCP in 1-2 weeks. Facility staff report no new concerns.    Past Medical History:  Diagnosis Date  . Anemia   . Anxiety   . Arthritis    "knees, feet, hands" (08/23/2016)  . Asthma   . CAD, NATIVE VESSEL    May 10, 2010 cath showed a hyperdynamic LV function, she had dominant circumflex anatomy with a 70-80% small OM1. She had diffuse diabetic plaque particularly in the distal LAD. She nondominant RCA.  Nondominant  . Cellulitis 10/15/2013  . CHF (congestive heart failure) (HCC)    Preserved EF  . Chronic bronchitis (HCC)    "probably once/ yr" (08/23/2016)  . Complication of anesthesia    "I've had difficulty waking up" (08/23/2016)  . COPD   . Depression   .  Diabetic neuropathy (HCC)   . Endometrial hyperplasia   . ESRD (end stage renal disease) on dialysis (HCC)    "[facility]; MWF; [facility]" (08/23/2016)  . GERD (gastroesophageal reflux disease)   . History of hiatal hernia   . HYPERLIPIDEMIA   . HYPERTENSION   . Hypothyroidism   . Myocardial infarct,  old   . On home oxygen therapy    "2L; 24/7" (08/23/2016)  . Paralyzed vocal cords   . Peripheral neuropathy (HCC)    hx/notes 01/27/2010  . Pneumonia "several times"  . Proliferative retinopathy    hx/notes 01/27/2010  . PVD    CEA  . Restless leg syndrome    "mostly on the right" (08/23/2016)  . Sleep apnea    not on cpap    . 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 Type II diabetes mellitus (HCC)     Past Surgical History:  Procedure Laterality Date  . AV FISTULA PLACEMENT Right 07/08/2015   Procedure: EXPLORATION RIGHT AXILLARY ARTERY AND RIGHT BRACHIAL VEIN;  Surgeon: Chuck Hint, MD;  Location: [facility] OR;  Service: Vascular;  Laterality: Right;  . CAROTID ENDARTERECTOMY Left X 2  . CATARACT EXTRACTION W/ INTRAOCULAR LENS  IMPLANT, BILATERAL Bilateral   . CERVICAL BIOPSY  ~ 2015   "precancerous cells"  . COLONOSCOPY    . EYE SURGERY Bilateral    numerous surgeries  . INSERTION OF DIALYSIS CATHETER N/A 06/24/2015   Procedure: ULTRASOUND BILATERAL INTERNAL JUGULAR VEIN INSERTION OF DIALYSIS CATHETER LEFT INTERNAL JUGULAR VEIN ;  Surgeon: Pryor Ochoa, MD;  Location: [facility] OR;  Service: Vascular;  Laterality: N/A;  . INTRAUTERINE DEVICE INSERTION  ~ 2015  . VITRECTOMY Bilateral       reports that she quit smoking about 11 years ago. Her smoking use included Cigarettes. She has a 20.00 pack-year smoking history. She has never used smokeless tobacco. She reports that she does not drink alcohol or use drugs. Social History   Social History  . Marital status: Married    Spouse name: N/A  . Number of children: 0  . Years of education: N/A   Occupational History  . disabled    Social History Main Topics  . Smoking status: Former Smoker    Packs/day: 1.00    Years: 20.00    Types: Cigarettes    Quit date: 09/11/2005  . Smokeless tobacco: Never Used    . Alcohol use No  . Drug use: No  . Sexual activity: No   Other Topics Concern  . Not on file   Social History Narrative  . No narrative on file    Allergies  Allergen Reactions  . Ciprofloxacin Itching  . Epinephrine     Increased heart rate    Pertinent  Health Maintenance Due  Topic Date Due  . FOOT EXAM  01/18/1965  . OPHTHALMOLOGY EXAM  01/18/1965  . URINE MICROALBUMIN  01/18/1965  . PAP SMEAR  01/19/1976  . COLONOSCOPY  01/18/2005  . MAMMOGRAM  12/15/2011  . INFLUENZA VACCINE  04/11/2016  . HEMOGLOBIN A1C  04/05/2017    Medications: Allergies as of 11/24/2016      Reactions   Ciprofloxacin Itching   Epinephrine    Increased heart rate      Medication List       Accurate as of 11/24/16  4:06 PM. Always use your  most recent med list.          acetaminophen 325 MG tablet Commonly known as:  TYLENOL Take 650 mg by mouth every 6 (six) hours as needed for mild pain.   aspirin EC 81 MG tablet Take 81 mg by mouth daily.   docusate sodium 100 MG capsule Commonly known as:  COLACE Take 1 capsule (100 mg total) by mouth daily.   guaiFENesin-dextromethorphan 100-10 MG/5ML syrup Commonly known as:  ROBITUSSIN DM Take 10 mLs by mouth every 6 (six) hours as needed for cough.   hydrocerin Crea Apply 1 application topically 2 (two) times daily.   hydrocortisone 25 MG suppository Commonly known as:  ANUSOL-HC Place 25 mg rectally 2 (two) times daily as needed for hemorrhoids or itching.   insulin glargine 100 UNIT/ML injection Commonly known as:  LANTUS Inject 0.4 mLs (40 Units total) into the skin 2 (two) times daily.   insulin lispro 100 UNIT/ML injection Commonly known as:  HUMALOG Inject 6 Units into the skin 3 (three) times daily before meals.   ipratropium-albuterol 0.5-2.5 (3) MG/3ML Soln Commonly known as:  DUONEB Take 3 mLs by nebulization every 8 (eight) hours as needed.   levonorgestrel 20 MCG/24HR IUD Commonly known as:  MIRENA 1 Intra  Uterine Device (1 each total) by Intrauterine route once.   levothyroxine 88 MCG tablet Commonly known as:  SYNTHROID, LEVOTHROID Take 1 tablet (88 mcg total) by mouth daily before breakfast.   midodrine 10 MG tablet Commonly known as:  PROAMATINE Take 1 tablet (10 mg total) by mouth 2 (two) times daily with a meal.   multivitamin Tabs tablet Take 1 tablet by mouth daily.   OXYGEN 2lpm 24/7- AHC   senna 8.6 MG tablet Commonly known as:  SENOKOT Take 2 tablets by mouth at bedtime.   sevelamer carbonate 2.4 g Pack Commonly known as:  RENVELA Take 2.4 g by mouth 3 (three) times daily with meals.   sodium chloride 0.65 % Soln nasal spray Commonly known as:  OCEAN Place 1 spray into both nostrils as needed for congestion.       Review of Systems  Constitutional: Negative for activity change, appetite change, chills, fatigue and fever.  HENT: Negative for congestion, rhinorrhea, sinus pain, sinus pressure, sneezing and sore throat.   Eyes: Negative.   Respiratory: Negative for cough, chest tightness, shortness of breath and wheezing.   Cardiovascular: Negative for chest pain, palpitations and leg swelling.  Gastrointestinal: Negative for abdominal distention, abdominal pain, constipation, diarrhea, nausea and vomiting.  Endocrine: Negative for cold intolerance, heat intolerance, polydipsia, polyphagia and polyuria.  Genitourinary:       ESRD on dialysis   Musculoskeletal: Positive for gait problem.  Skin: Negative for color change, pallor and rash.        left heel ulcer   Neurological: Negative for dizziness, seizures, syncope, light-headedness and headaches.  Hematological: Does not bruise/bleed easily.  Psychiatric/Behavioral: Negative for agitation, confusion, hallucinations and sleep disturbance. The patient is not nervous/anxious.     Vitals:   11/24/16 0936  BP: 136/76  Pulse: 64  Resp: 18  Temp: 97.3 F (36.3 C)  TempSrc: Oral  SpO2: 95%  Weight: 275 lb 9.6  oz (125 kg)  Height: 5\' 4"  (1.626 m)   Body mass index is 47.31 kg/m. Physical Exam  Constitutional: She is oriented to person, place, and time. No distress.  Morbid obese  HENT:  Head: Normocephalic.  Mouth/Throat: Oropharynx is clear and moist. No oropharyngeal exudate.  Eyes: Conjunctivae and EOM are normal. Pupils are equal, round, and reactive to light. Right eye exhibits no discharge. Left eye exhibits no discharge. No scleral icterus.  Neck: Normal range of motion. No JVD present. No thyromegaly present.  Cardiovascular: Normal rate, regular rhythm and intact distal pulses.  Exam reveals no gallop and no friction rub.   No murmur heard. Pulmonary/Chest: Effort normal and breath sounds normal. No respiratory distress. She has no wheezes. She has no rales.  Oxygen 2 liters via [place]   Abdominal: Soft. Bowel sounds are normal. She exhibits no distension. There is no tenderness. There is no rebound and no guarding.  Genitourinary:  Genitourinary Comments: On Dialysis on Mon, Wed and Fri   Musculoskeletal: She exhibits no tenderness or deformity.  Moves x 4 extremities.unsteady gait   Lymphadenopathy:    She has no cervical adenopathy.  Neurological: She is oriented to person, place, and time.  Skin: Skin is warm and dry. No rash noted. No erythema. No pallor.   Left Heel; dry eschar.Surrounding skin tissue without any signs of infections.   Psychiatric: She has a normal mood and affect.    Labs reviewed: Basic Metabolic Panel:  Recent Labs  16/10/96 0830  09/12/16 0004 09/13/16 0730 09/14/16 0531 09/19/16 10/06/16  NA 129*  < > 133* 136 133* 139 134*  K 3.8  < > 3.9 3.4* 3.9 3.9  --   CL 94*  < > 95* 98* 97*  --   --   CO2 22  < > 22 25 26   --   --   GLUCOSE 230*  < > 325* 176* 158*  --   --   BUN 29*  < > 16 27* 17 16 24*  CREATININE 5.65*  < > 4.74* 6.68* 4.45* 4.6* 4.9*  CALCIUM 8.9  < > 9.1 9.2 9.2  --   --   PHOS 7.2*  --   --  5.0* 3.4  --   --   < > = values in  this interval not displayed. Liver Function Tests: CBC:  Recent Labs  08/23/16 0940  09/06/16 09/11/16 1415 09/13/16 0730 09/14/16 0531 09/19/16 10/06/16  WBC 31.0*  < > 12.9 9.6 10.4 9.3 10.1  --   NEUTROABS 29.2*  --  11 7.2  --   --   --   --   HGB 10.9*  < > 10.5* 11.4* 10.8* 11.1* 10.8* 12.2  HCT 32.9*  < > 34* 36.8 34.9* 36.1 36 39  MCV 102.2*  < >  --  107.3* 108.4* 106.8*  --   --   PLT 212  < > 189 159 150 145* 131* 170  < > = values in this interval not displayed. Cardiac Enzymes:  Recent Labs  08/28/16 0545 08/28/16 1141 08/29/16 0423  TROPONINI 0.09* 0.13* 0.11*    Recent Labs  09/14/16 0815 09/14/16 1142 09/14/16 1642  GLUCAP 143* 173* 238*   Assessment/Plan:   1. Unsteady gait Has worked well with PT/ OT. Will discharge home PT/OT to continue with ROM, Exercise, Gait stability and muscle strengthening. She will  Require  DME Rollator to allow her to maintain current level of independence with ADL's. Fall and safety precautions.  2. Type 2 diabetes mellitus with diabetic nephropathy CBG's log ranging in the 100's-low 200's. Continue on Lantus 40 units twice daily and Humalog 6 units three times with meals.on ASA.Hgb A1C with PCP.     3. Chronic obstructive pulmonary disease Breathing stable.  Continue on Duonebs every 8 Hrs as needed. Continue on continuous oxygen 2 Liters via [place]. Has oxygen concentrator at home.   4. Coronary artery disease Chest pain free. Continue on ASA.  5. Chronic diastolic CHF (congestive heart failure) Appears compensated. Continue to manage Fluid overload with dialysis. Monitor weight. Continue fluid restrictions.   6. Idiopathic hypotension B/p stable. Continue on midodrine 10 mg tablet twice daily.    7. Hypothyroidism Continue on Levothyroxine 88 mcg tablet. Monitor TSH level.   8. Decubitus ulcer of heel, unstageable Afebrile. Progressive healing dry eschar noted.will discharge home with [facility] RN for wound care.   9.  ESRD (end stage renal disease)  On dialysis Mon, Wed and Fri. Continue on Renvela and midodrine. Continue Renal diet and Fluid restriction.   Patient is being discharged with the following home health services:    -PT/OT for ROM, exercise, gait stability and muscle strengthening  -  HH RN for left heel wound care management   Patient is being discharged with the following durable medical equipment:    - FWW  to allow her to maintain current level of independence.  - Bariatric 3-1 bedside commode to enable her to safely and independently perform toileting transfer in home due to unsteady gait and morbid obesity.  Patient has been advised to f/u with their PCP in 1-2 weeks to for a transitions of care visit.Social services at their facility was responsible for arranging this appointment.  Pt was provided with adequate prescriptions of noncontrolled medications to reach the scheduled appointment.For controlled substances, a limited supply was provided as appropriate for the individual patient. If the pt normally receives these medications from a pain clinic or has a contract with another physician, these medications should be received from that clinic or physician only).    Future labs/tests needed:  CBC, BMP, TSH level, Hgb A1C  in 1-2 weeks PCP

## 2016-11-27 DIAGNOSIS — D631 Anemia in chronic kidney disease: Secondary | ICD-10-CM | POA: Diagnosis not present

## 2016-11-27 DIAGNOSIS — N186 End stage renal disease: Secondary | ICD-10-CM | POA: Diagnosis not present

## 2016-11-27 DIAGNOSIS — D689 Coagulation defect, unspecified: Secondary | ICD-10-CM | POA: Diagnosis not present

## 2016-11-27 DIAGNOSIS — D509 Iron deficiency anemia, unspecified: Secondary | ICD-10-CM | POA: Diagnosis not present

## 2016-11-27 DIAGNOSIS — N2581 Secondary hyperparathyroidism of renal origin: Secondary | ICD-10-CM | POA: Diagnosis not present

## 2016-11-27 DIAGNOSIS — Z283 Underimmunization status: Secondary | ICD-10-CM | POA: Diagnosis not present

## 2016-11-28 DIAGNOSIS — I5032 Chronic diastolic (congestive) heart failure: Secondary | ICD-10-CM | POA: Diagnosis not present

## 2016-11-28 DIAGNOSIS — I132 Hypertensive heart and chronic kidney disease with heart failure and with stage 5 chronic kidney disease, or end stage renal disease: Secondary | ICD-10-CM | POA: Diagnosis not present

## 2016-11-28 DIAGNOSIS — E1122 Type 2 diabetes mellitus with diabetic chronic kidney disease: Secondary | ICD-10-CM | POA: Diagnosis not present

## 2016-11-28 DIAGNOSIS — L89620 Pressure ulcer of left heel, unstageable: Secondary | ICD-10-CM | POA: Diagnosis not present

## 2016-11-28 DIAGNOSIS — J449 Chronic obstructive pulmonary disease, unspecified: Secondary | ICD-10-CM | POA: Diagnosis not present

## 2016-11-28 DIAGNOSIS — N186 End stage renal disease: Secondary | ICD-10-CM | POA: Diagnosis not present

## 2016-11-29 DIAGNOSIS — D631 Anemia in chronic kidney disease: Secondary | ICD-10-CM | POA: Diagnosis not present

## 2016-11-29 DIAGNOSIS — N2581 Secondary hyperparathyroidism of renal origin: Secondary | ICD-10-CM | POA: Diagnosis not present

## 2016-11-29 DIAGNOSIS — N186 End stage renal disease: Secondary | ICD-10-CM | POA: Diagnosis not present

## 2016-11-29 DIAGNOSIS — D509 Iron deficiency anemia, unspecified: Secondary | ICD-10-CM | POA: Diagnosis not present

## 2016-11-29 DIAGNOSIS — Z283 Underimmunization status: Secondary | ICD-10-CM | POA: Diagnosis not present

## 2016-11-29 DIAGNOSIS — D689 Coagulation defect, unspecified: Secondary | ICD-10-CM | POA: Diagnosis not present

## 2016-11-30 DIAGNOSIS — I132 Hypertensive heart and chronic kidney disease with heart failure and with stage 5 chronic kidney disease, or end stage renal disease: Secondary | ICD-10-CM | POA: Diagnosis not present

## 2016-11-30 DIAGNOSIS — E1122 Type 2 diabetes mellitus with diabetic chronic kidney disease: Secondary | ICD-10-CM | POA: Diagnosis not present

## 2016-11-30 DIAGNOSIS — N186 End stage renal disease: Secondary | ICD-10-CM | POA: Diagnosis not present

## 2016-11-30 DIAGNOSIS — L89620 Pressure ulcer of left heel, unstageable: Secondary | ICD-10-CM | POA: Diagnosis not present

## 2016-11-30 DIAGNOSIS — I5032 Chronic diastolic (congestive) heart failure: Secondary | ICD-10-CM | POA: Diagnosis not present

## 2016-11-30 DIAGNOSIS — J449 Chronic obstructive pulmonary disease, unspecified: Secondary | ICD-10-CM | POA: Diagnosis not present

## 2016-12-01 DIAGNOSIS — D689 Coagulation defect, unspecified: Secondary | ICD-10-CM | POA: Diagnosis not present

## 2016-12-01 DIAGNOSIS — D631 Anemia in chronic kidney disease: Secondary | ICD-10-CM | POA: Diagnosis not present

## 2016-12-01 DIAGNOSIS — Z283 Underimmunization status: Secondary | ICD-10-CM | POA: Diagnosis not present

## 2016-12-01 DIAGNOSIS — N2581 Secondary hyperparathyroidism of renal origin: Secondary | ICD-10-CM | POA: Diagnosis not present

## 2016-12-01 DIAGNOSIS — N186 End stage renal disease: Secondary | ICD-10-CM | POA: Diagnosis not present

## 2016-12-01 DIAGNOSIS — D509 Iron deficiency anemia, unspecified: Secondary | ICD-10-CM | POA: Diagnosis not present

## 2016-12-02 DIAGNOSIS — J449 Chronic obstructive pulmonary disease, unspecified: Secondary | ICD-10-CM | POA: Diagnosis not present

## 2016-12-02 DIAGNOSIS — N186 End stage renal disease: Secondary | ICD-10-CM | POA: Diagnosis not present

## 2016-12-02 DIAGNOSIS — L89620 Pressure ulcer of left heel, unstageable: Secondary | ICD-10-CM | POA: Diagnosis not present

## 2016-12-02 DIAGNOSIS — I5032 Chronic diastolic (congestive) heart failure: Secondary | ICD-10-CM | POA: Diagnosis not present

## 2016-12-02 DIAGNOSIS — E1122 Type 2 diabetes mellitus with diabetic chronic kidney disease: Secondary | ICD-10-CM | POA: Diagnosis not present

## 2016-12-02 DIAGNOSIS — I132 Hypertensive heart and chronic kidney disease with heart failure and with stage 5 chronic kidney disease, or end stage renal disease: Secondary | ICD-10-CM | POA: Diagnosis not present

## 2016-12-04 DIAGNOSIS — D509 Iron deficiency anemia, unspecified: Secondary | ICD-10-CM | POA: Diagnosis not present

## 2016-12-04 DIAGNOSIS — D631 Anemia in chronic kidney disease: Secondary | ICD-10-CM | POA: Diagnosis not present

## 2016-12-04 DIAGNOSIS — N2581 Secondary hyperparathyroidism of renal origin: Secondary | ICD-10-CM | POA: Diagnosis not present

## 2016-12-04 DIAGNOSIS — Z283 Underimmunization status: Secondary | ICD-10-CM | POA: Diagnosis not present

## 2016-12-04 DIAGNOSIS — D689 Coagulation defect, unspecified: Secondary | ICD-10-CM | POA: Diagnosis not present

## 2016-12-04 DIAGNOSIS — N186 End stage renal disease: Secondary | ICD-10-CM | POA: Diagnosis not present

## 2016-12-05 DIAGNOSIS — E1122 Type 2 diabetes mellitus with diabetic chronic kidney disease: Secondary | ICD-10-CM | POA: Diagnosis not present

## 2016-12-05 DIAGNOSIS — N186 End stage renal disease: Secondary | ICD-10-CM | POA: Diagnosis not present

## 2016-12-05 DIAGNOSIS — I5032 Chronic diastolic (congestive) heart failure: Secondary | ICD-10-CM | POA: Diagnosis not present

## 2016-12-05 DIAGNOSIS — L89620 Pressure ulcer of left heel, unstageable: Secondary | ICD-10-CM | POA: Diagnosis not present

## 2016-12-05 DIAGNOSIS — I132 Hypertensive heart and chronic kidney disease with heart failure and with stage 5 chronic kidney disease, or end stage renal disease: Secondary | ICD-10-CM | POA: Diagnosis not present

## 2016-12-05 DIAGNOSIS — J449 Chronic obstructive pulmonary disease, unspecified: Secondary | ICD-10-CM | POA: Diagnosis not present

## 2016-12-06 ENCOUNTER — Ambulatory Visit (HOSPITAL_COMMUNITY)
Admission: RE | Admit: 2016-12-06 | Discharge: 2016-12-06 | Disposition: A | Discharge status: Home / Self Care | Payer: Medicare Other | Source: Ambulatory Visit | Attending: Vascular Surgery | Admitting: Vascular Surgery

## 2016-12-06 ENCOUNTER — Ambulatory Visit (INDEPENDENT_AMBULATORY_CARE_PROVIDER_SITE_OTHER): Payer: Medicare Other | Admitting: Vascular Surgery

## 2016-12-06 ENCOUNTER — Other Ambulatory Visit (HOSPITAL_COMMUNITY): Payer: Medicare Other

## 2016-12-06 ENCOUNTER — Encounter (HOSPITAL_COMMUNITY): Payer: Medicare Other

## 2016-12-06 ENCOUNTER — Ambulatory Visit: Payer: Medicare Other | Admitting: Vascular Surgery

## 2016-12-06 ENCOUNTER — Encounter (HOSPITAL_COMMUNITY): Payer: Self-pay

## 2016-12-06 ENCOUNTER — Encounter: Payer: Self-pay | Admitting: Vascular Surgery

## 2016-12-06 VITALS — BP 101/47 | HR 85 | Resp 18 | Ht 64.0 in | Wt 275.0 lb

## 2016-12-06 DIAGNOSIS — Z01810 Encounter for preprocedural cardiovascular examination: Secondary | ICD-10-CM

## 2016-12-06 DIAGNOSIS — N2581 Secondary hyperparathyroidism of renal origin: Secondary | ICD-10-CM | POA: Diagnosis not present

## 2016-12-06 DIAGNOSIS — D631 Anemia in chronic kidney disease: Secondary | ICD-10-CM | POA: Diagnosis not present

## 2016-12-06 DIAGNOSIS — I2510 Atherosclerotic heart disease of native coronary artery without angina pectoris: Secondary | ICD-10-CM | POA: Diagnosis not present

## 2016-12-06 DIAGNOSIS — N186 End stage renal disease: Secondary | ICD-10-CM

## 2016-12-06 DIAGNOSIS — D689 Coagulation defect, unspecified: Secondary | ICD-10-CM | POA: Diagnosis not present

## 2016-12-06 DIAGNOSIS — Z283 Underimmunization status: Secondary | ICD-10-CM | POA: Diagnosis not present

## 2016-12-06 DIAGNOSIS — T82868A Thrombosis of vascular prosthetic devices, implants and grafts, initial encounter: Secondary | ICD-10-CM

## 2016-12-06 DIAGNOSIS — Z992 Dependence on renal dialysis: Secondary | ICD-10-CM

## 2016-12-06 DIAGNOSIS — D509 Iron deficiency anemia, unspecified: Secondary | ICD-10-CM | POA: Diagnosis not present

## 2016-12-06 NOTE — Progress Notes (Signed)
 Patient name: Kimberly [name] MRN: [id number] DOB: 04/25/1956 Sex: female  REASON FOR VISIT: To evaluate for new hemodialysis access. Referred by Dr. James Lin  HPI: Kimberly [name] is a 60 y.o. female who I had seen in October 2016 in order to evaluate her for hemodialysis access. She has end-stage renal disease secondary to diabetes. She has a complicated history in that she has diastolic right-sided heart failure, and COPD. She's had previous carotid endarterectomy and redo carotid endarterectomy by Dr. Lawson. Look like her best option for fistula wasn't upper arm brachiocephalic fistula on the right. She was taken to the operative room on 07/08/2015 however she had an intraoperative cardiac arrest and the procedure was aborted.  She comes in to discuss new access. She was recently hospitalized with an infection it, but they could not find the source of infection. She has a left IJ tunnel dialysis catheter which she's had for about 2 months.  She's had a previous injury in her left arm and has poor circulation on the left according to her.  She denies any recent uremic symptoms. Specifically, she denies nausea, vomiting, fatigue, anorexia, or palpitations.  Past Medical History:  Diagnosis Date  . Anemia   . Anxiety   . Arthritis    "knees, feet, hands" (08/23/2016)  . Asthma   . CAD, NATIVE VESSEL    May 10, 2010 cath showed a hyperdynamic LV function, she had dominant circumflex anatomy with a 70-80% small OM1. She had diffuse diabetic plaque particularly in the distal LAD. She nondominant RCA.  Nondominant  . Cellulitis 10/15/2013  . CHF (congestive heart failure) (HCC)    Preserved EF  . Chronic bronchitis (HCC)    "probably once/ yr" (08/23/2016)  . Complication of anesthesia    "I've had difficulty waking up" (08/23/2016)  . COPD   . Depression   . Diabetic neuropathy (HCC)   . Endometrial hyperplasia   . ESRD (end stage renal disease) on dialysis (HCC)    "[facility]  MWF; [facility]" (08/23/2016)  . GERD (gastroesophageal reflux disease)   . History of hiatal hernia   . HYPERLIPIDEMIA   . HYPERTENSION   . Hypothyroidism   . Myocardial infarct, old   . On home oxygen therapy    "2L; 24/7" (08/23/2016)  . Paralyzed vocal cords   . Peripheral neuropathy (HCC)    hx/notes 01/27/2010  . Pneumonia "several times"  . Proliferative retinopathy    hx/notes 01/27/2010  . PVD    CEA  . Restless leg syndrome    "mostly on the right" (08/23/2016)  . Sleep apnea    not on cpap    . 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 Type II diabetes mellitus (HCC)     Family History  Problem Relation Age of Onset  . Cancer Mother     Breast, NHL  . Stroke Mother   . Peripheral vascular disease Father   . CAD Father 30  . Heart attack Father   . Hypertension Father   . Asthma Father   . Heart disease Father     before age 60    SOCIAL HISTORY: Social History  Substance Use Topics  . Smoking status: Former Smoker    Packs/day: 1.00    Years: 20.00    Types: Cigarettes    Quit date: 09/11/2005  . Smokeless   tobacco: Never Used  . Alcohol use No    Allergies  Allergen Reactions  . Ciprofloxacin Itching  . Epinephrine     Increased heart rate    Current Outpatient Prescriptions  Medication Sig Dispense Refill  . acetaminophen (TYLENOL) 325 MG tablet Take 650 mg by mouth every 6 (six) hours as needed for mild pain.    . aspirin EC 81 MG tablet Take 81 mg by mouth daily.    . docusate sodium (COLACE) 100 MG capsule Take 1 capsule (100 mg total) by mouth daily. 10 capsule   . guaiFENesin-dextromethorphan (ROBITUSSIN DM) 100-10 MG/5ML syrup Take 10 mLs by mouth every 6 (six) hours as needed for cough.    . hydrocerin (EUCERIN) CREA Apply 1 application topically 2 (two) times daily. 113 g 0  . hydrocortisone (ANUSOL-HC) 25 MG suppository  Place 25 mg rectally 2 (two) times daily as needed for hemorrhoids or itching.    . insulin glargine (LANTUS) 100 UNIT/ML injection Inject 0.4 mLs (40 Units total) into the skin 2 (two) times daily. 10 mL 11  . insulin lispro (HUMALOG) 100 UNIT/ML injection Inject 6 Units into the skin 3 (three) times daily before meals.    . ipratropium-albuterol (DUONEB) 0.5-2.5 (3) MG/3ML SOLN Take 3 mLs by nebulization every 8 (eight) hours as needed.    . levonorgestrel (MIRENA) 20 MCG/24HR IUD 1 Intra Uterine Device (1 each total) by Intrauterine route once. 1 each 0  . levothyroxine (SYNTHROID, LEVOTHROID) 88 MCG tablet Take 1 tablet (88 mcg total) by mouth daily before breakfast.    . midodrine (PROAMATINE) 10 MG tablet Take 1 tablet (10 mg total) by mouth 2 (two) times daily with a meal. 60 tablet 0  . multivitamin (RENA-VIT) TABS tablet Take 1 tablet by mouth daily.    . OXYGEN 2lpm 24/7- AHC    . senna (SENOKOT) 8.6 MG tablet Take 2 tablets by mouth at bedtime.    . sevelamer carbonate (RENVELA) 2.4 g PACK Take 2.4 g by mouth 3 (three) times daily with meals. 90 each 0  . sodium chloride (OCEAN) 0.65 % SOLN nasal spray Place 1 spray into both nostrils as needed for congestion.     No current facility-administered medications for this visit.     REVIEW OF SYSTEMS:  [X] denotes positive finding, [ ] denotes negative finding Cardiac  Comments:  Chest pain or chest pressure:    Shortness of breath upon exertion:    Short of breath when lying flat:    Irregular heart rhythm:        Vascular    Pain in calf, thigh, or hip brought on by ambulation:    Pain in feet at night that wakes you up from your sleep:     Blood clot in your veins:    Leg swelling:         Pulmonary    Oxygen at home:    Productive cough:     Wheezing:         Neurologic    Sudden weakness in arms or legs:     Sudden numbness in arms or legs:     Sudden onset of difficulty speaking or slurred speech:    Temporary loss  of vision in one eye:     Problems with dizziness:         Gastrointestinal    Blood in stool:     Vomited blood:         Genitourinary      Burning when urinating:     Blood in urine:        Psychiatric    Major depression:         Hematologic    Bleeding problems:    Problems with blood clotting too easily:        Skin    Rashes or ulcers:        Constitutional    Fever or chills:      PHYSICAL EXAM: Vitals:   12/06/16 0822  BP: (!) 101/47  Pulse: 85  Resp: 18  Weight: 275 lb (124.7 kg)  Height: 5' 4" (1.626 m)    GENERAL: The patient is a well-nourished female, in no acute distress. The vital signs are documented above. CARDIAC: There is a regular rate and rhythm.  VASCULAR: She has a palpable right radial pulse. I cannot palpate a left radial pulse. PULMONARY: There is good air exchange bilaterally without wheezing or rales. ABDOMEN: Soft and non-tender with normal pitched bowel sounds.  MUSCULOSKELETAL: There are no major deformities or cyanosis. NEUROLOGIC: No focal weakness or paresthesias are detected. SKIN: There are no ulcers or rashes noted. PSYCHIATRIC: The patient has a normal affect.  DATA:   I reviewed her previous vein map and it appeared that her best option for a fistula was the brachiocephalic fistula on the right. At the time of exploration in 2016, all of her veins were small including her axillary vein.  MEDICAL ISSUES:  END-STAGE RENAL DISEASE: The patient is not a good candidate for access in the left arm because of the diminished radial pulse and a history of trauma to the arm. She had very small arteries and veins at the time of exploration in 2016. However, I think it would be reasonable to attempt an upper arm graft on the right. She may potentially require a loop graft given that her brachial artery was found to be very small. As noted above she had a cardiac arrest and we attempted her access in 2016 and also after apparently a carotid  endarterectomy in the past. Therefore she is very hasn't consider proceeding with surgery and needs to think about this further. She also just got out of rehabilitation and once he gets stronger. We will be happy to schedule placement of an upper arm graft in the right arm under local anesthesia in the future when she is agreeable to proceed. We have discussed the procedure and potential complications.    Apollos Tenbrink [facility] [phone]   

## 2016-12-07 DIAGNOSIS — J449 Chronic obstructive pulmonary disease, unspecified: Secondary | ICD-10-CM | POA: Diagnosis not present

## 2016-12-07 DIAGNOSIS — I132 Hypertensive heart and chronic kidney disease with heart failure and with stage 5 chronic kidney disease, or end stage renal disease: Secondary | ICD-10-CM | POA: Diagnosis not present

## 2016-12-07 DIAGNOSIS — E1122 Type 2 diabetes mellitus with diabetic chronic kidney disease: Secondary | ICD-10-CM | POA: Diagnosis not present

## 2016-12-07 DIAGNOSIS — I5032 Chronic diastolic (congestive) heart failure: Secondary | ICD-10-CM | POA: Diagnosis not present

## 2016-12-07 DIAGNOSIS — L89620 Pressure ulcer of left heel, unstageable: Secondary | ICD-10-CM | POA: Diagnosis not present

## 2016-12-07 DIAGNOSIS — N186 End stage renal disease: Secondary | ICD-10-CM | POA: Diagnosis not present

## 2016-12-08 DIAGNOSIS — D689 Coagulation defect, unspecified: Secondary | ICD-10-CM | POA: Diagnosis not present

## 2016-12-08 DIAGNOSIS — D509 Iron deficiency anemia, unspecified: Secondary | ICD-10-CM | POA: Diagnosis not present

## 2016-12-08 DIAGNOSIS — Z283 Underimmunization status: Secondary | ICD-10-CM | POA: Diagnosis not present

## 2016-12-08 DIAGNOSIS — N2581 Secondary hyperparathyroidism of renal origin: Secondary | ICD-10-CM | POA: Diagnosis not present

## 2016-12-08 DIAGNOSIS — D631 Anemia in chronic kidney disease: Secondary | ICD-10-CM | POA: Diagnosis not present

## 2016-12-08 DIAGNOSIS — N186 End stage renal disease: Secondary | ICD-10-CM | POA: Diagnosis not present

## 2016-12-09 DIAGNOSIS — N186 End stage renal disease: Secondary | ICD-10-CM | POA: Diagnosis not present

## 2016-12-09 DIAGNOSIS — Z992 Dependence on renal dialysis: Secondary | ICD-10-CM | POA: Diagnosis not present

## 2016-12-09 DIAGNOSIS — I129 Hypertensive chronic kidney disease with stage 1 through stage 4 chronic kidney disease, or unspecified chronic kidney disease: Secondary | ICD-10-CM | POA: Diagnosis not present

## 2016-12-11 DIAGNOSIS — D649 Anemia, unspecified: Secondary | ICD-10-CM | POA: Diagnosis not present

## 2016-12-11 DIAGNOSIS — D509 Iron deficiency anemia, unspecified: Secondary | ICD-10-CM | POA: Diagnosis not present

## 2016-12-11 DIAGNOSIS — D689 Coagulation defect, unspecified: Secondary | ICD-10-CM | POA: Diagnosis not present

## 2016-12-11 DIAGNOSIS — N186 End stage renal disease: Secondary | ICD-10-CM | POA: Diagnosis not present

## 2016-12-11 DIAGNOSIS — N2581 Secondary hyperparathyroidism of renal origin: Secondary | ICD-10-CM | POA: Diagnosis not present

## 2016-12-11 DIAGNOSIS — D631 Anemia in chronic kidney disease: Secondary | ICD-10-CM | POA: Diagnosis not present

## 2016-12-11 DIAGNOSIS — Z23 Encounter for immunization: Secondary | ICD-10-CM | POA: Diagnosis not present

## 2016-12-11 DIAGNOSIS — Z283 Underimmunization status: Secondary | ICD-10-CM | POA: Diagnosis not present

## 2016-12-11 DIAGNOSIS — E119 Type 2 diabetes mellitus without complications: Secondary | ICD-10-CM | POA: Diagnosis not present

## 2016-12-12 DIAGNOSIS — I5032 Chronic diastolic (congestive) heart failure: Secondary | ICD-10-CM | POA: Diagnosis not present

## 2016-12-12 DIAGNOSIS — N186 End stage renal disease: Secondary | ICD-10-CM | POA: Diagnosis not present

## 2016-12-12 DIAGNOSIS — I132 Hypertensive heart and chronic kidney disease with heart failure and with stage 5 chronic kidney disease, or end stage renal disease: Secondary | ICD-10-CM | POA: Diagnosis not present

## 2016-12-12 DIAGNOSIS — L89620 Pressure ulcer of left heel, unstageable: Secondary | ICD-10-CM | POA: Diagnosis not present

## 2016-12-12 DIAGNOSIS — E1122 Type 2 diabetes mellitus with diabetic chronic kidney disease: Secondary | ICD-10-CM | POA: Diagnosis not present

## 2016-12-12 DIAGNOSIS — J449 Chronic obstructive pulmonary disease, unspecified: Secondary | ICD-10-CM | POA: Diagnosis not present

## 2016-12-13 DIAGNOSIS — D649 Anemia, unspecified: Secondary | ICD-10-CM | POA: Diagnosis not present

## 2016-12-13 DIAGNOSIS — D689 Coagulation defect, unspecified: Secondary | ICD-10-CM | POA: Diagnosis not present

## 2016-12-13 DIAGNOSIS — Z283 Underimmunization status: Secondary | ICD-10-CM | POA: Diagnosis not present

## 2016-12-13 DIAGNOSIS — D631 Anemia in chronic kidney disease: Secondary | ICD-10-CM | POA: Diagnosis not present

## 2016-12-13 DIAGNOSIS — N186 End stage renal disease: Secondary | ICD-10-CM | POA: Diagnosis not present

## 2016-12-13 DIAGNOSIS — D509 Iron deficiency anemia, unspecified: Secondary | ICD-10-CM | POA: Diagnosis not present

## 2016-12-14 ENCOUNTER — Encounter (HOSPITAL_BASED_OUTPATIENT_CLINIC_OR_DEPARTMENT_OTHER): Payer: Medicare Other | Attending: Internal Medicine

## 2016-12-14 DIAGNOSIS — E1140 Type 2 diabetes mellitus with diabetic neuropathy, unspecified: Secondary | ICD-10-CM | POA: Diagnosis not present

## 2016-12-14 DIAGNOSIS — L89620 Pressure ulcer of left heel, unstageable: Secondary | ICD-10-CM | POA: Diagnosis not present

## 2016-12-14 DIAGNOSIS — Z992 Dependence on renal dialysis: Secondary | ICD-10-CM | POA: Insufficient documentation

## 2016-12-14 DIAGNOSIS — E1122 Type 2 diabetes mellitus with diabetic chronic kidney disease: Secondary | ICD-10-CM | POA: Insufficient documentation

## 2016-12-14 DIAGNOSIS — L84 Corns and callosities: Secondary | ICD-10-CM | POA: Insufficient documentation

## 2016-12-14 DIAGNOSIS — N186 End stage renal disease: Secondary | ICD-10-CM | POA: Insufficient documentation

## 2016-12-14 DIAGNOSIS — I252 Old myocardial infarction: Secondary | ICD-10-CM | POA: Insufficient documentation

## 2016-12-14 DIAGNOSIS — E11621 Type 2 diabetes mellitus with foot ulcer: Secondary | ICD-10-CM | POA: Diagnosis not present

## 2016-12-14 DIAGNOSIS — Z86718 Personal history of other venous thrombosis and embolism: Secondary | ICD-10-CM | POA: Insufficient documentation

## 2016-12-14 DIAGNOSIS — J449 Chronic obstructive pulmonary disease, unspecified: Secondary | ICD-10-CM | POA: Insufficient documentation

## 2016-12-14 DIAGNOSIS — Z87891 Personal history of nicotine dependence: Secondary | ICD-10-CM | POA: Diagnosis not present

## 2016-12-14 DIAGNOSIS — I70202 Unspecified atherosclerosis of native arteries of extremities, left leg: Secondary | ICD-10-CM | POA: Diagnosis not present

## 2016-12-14 DIAGNOSIS — I2510 Atherosclerotic heart disease of native coronary artery without angina pectoris: Secondary | ICD-10-CM | POA: Insufficient documentation

## 2016-12-14 DIAGNOSIS — I120 Hypertensive chronic kidney disease with stage 5 chronic kidney disease or end stage renal disease: Secondary | ICD-10-CM | POA: Diagnosis not present

## 2016-12-14 DIAGNOSIS — E1151 Type 2 diabetes mellitus with diabetic peripheral angiopathy without gangrene: Secondary | ICD-10-CM | POA: Diagnosis not present

## 2016-12-14 DIAGNOSIS — L97422 Non-pressure chronic ulcer of left heel and midfoot with fat layer exposed: Secondary | ICD-10-CM | POA: Diagnosis not present

## 2016-12-14 DIAGNOSIS — I132 Hypertensive heart and chronic kidney disease with heart failure and with stage 5 chronic kidney disease, or end stage renal disease: Secondary | ICD-10-CM | POA: Diagnosis not present

## 2016-12-14 DIAGNOSIS — I5032 Chronic diastolic (congestive) heart failure: Secondary | ICD-10-CM | POA: Diagnosis not present

## 2016-12-15 DIAGNOSIS — D649 Anemia, unspecified: Secondary | ICD-10-CM | POA: Diagnosis not present

## 2016-12-15 DIAGNOSIS — Z283 Underimmunization status: Secondary | ICD-10-CM | POA: Diagnosis not present

## 2016-12-15 DIAGNOSIS — D509 Iron deficiency anemia, unspecified: Secondary | ICD-10-CM | POA: Diagnosis not present

## 2016-12-15 DIAGNOSIS — D631 Anemia in chronic kidney disease: Secondary | ICD-10-CM | POA: Diagnosis not present

## 2016-12-15 DIAGNOSIS — T8249XA Other complication of vascular dialysis catheter, initial encounter: Secondary | ICD-10-CM | POA: Diagnosis not present

## 2016-12-15 DIAGNOSIS — N186 End stage renal disease: Secondary | ICD-10-CM | POA: Diagnosis not present

## 2016-12-15 DIAGNOSIS — D689 Coagulation defect, unspecified: Secondary | ICD-10-CM | POA: Diagnosis not present

## 2016-12-15 DIAGNOSIS — Z992 Dependence on renal dialysis: Secondary | ICD-10-CM | POA: Diagnosis not present

## 2016-12-18 DIAGNOSIS — D649 Anemia, unspecified: Secondary | ICD-10-CM | POA: Diagnosis not present

## 2016-12-18 DIAGNOSIS — D631 Anemia in chronic kidney disease: Secondary | ICD-10-CM | POA: Diagnosis not present

## 2016-12-18 DIAGNOSIS — D509 Iron deficiency anemia, unspecified: Secondary | ICD-10-CM | POA: Diagnosis not present

## 2016-12-18 DIAGNOSIS — Z283 Underimmunization status: Secondary | ICD-10-CM | POA: Diagnosis not present

## 2016-12-18 DIAGNOSIS — N186 End stage renal disease: Secondary | ICD-10-CM | POA: Diagnosis not present

## 2016-12-18 DIAGNOSIS — D689 Coagulation defect, unspecified: Secondary | ICD-10-CM | POA: Diagnosis not present

## 2016-12-19 DIAGNOSIS — I5032 Chronic diastolic (congestive) heart failure: Secondary | ICD-10-CM | POA: Diagnosis not present

## 2016-12-19 DIAGNOSIS — L89620 Pressure ulcer of left heel, unstageable: Secondary | ICD-10-CM | POA: Diagnosis not present

## 2016-12-19 DIAGNOSIS — N186 End stage renal disease: Secondary | ICD-10-CM | POA: Diagnosis not present

## 2016-12-19 DIAGNOSIS — I132 Hypertensive heart and chronic kidney disease with heart failure and with stage 5 chronic kidney disease, or end stage renal disease: Secondary | ICD-10-CM | POA: Diagnosis not present

## 2016-12-19 DIAGNOSIS — J449 Chronic obstructive pulmonary disease, unspecified: Secondary | ICD-10-CM | POA: Diagnosis not present

## 2016-12-19 DIAGNOSIS — E1122 Type 2 diabetes mellitus with diabetic chronic kidney disease: Secondary | ICD-10-CM | POA: Diagnosis not present

## 2016-12-20 ENCOUNTER — Encounter: Payer: Self-pay | Admitting: *Deleted

## 2016-12-20 ENCOUNTER — Other Ambulatory Visit: Payer: Self-pay | Admitting: *Deleted

## 2016-12-20 DIAGNOSIS — Z283 Underimmunization status: Secondary | ICD-10-CM | POA: Diagnosis not present

## 2016-12-20 DIAGNOSIS — D509 Iron deficiency anemia, unspecified: Secondary | ICD-10-CM | POA: Diagnosis not present

## 2016-12-20 DIAGNOSIS — D631 Anemia in chronic kidney disease: Secondary | ICD-10-CM | POA: Diagnosis not present

## 2016-12-20 DIAGNOSIS — D649 Anemia, unspecified: Secondary | ICD-10-CM | POA: Diagnosis not present

## 2016-12-20 DIAGNOSIS — N186 End stage renal disease: Secondary | ICD-10-CM | POA: Diagnosis not present

## 2016-12-20 DIAGNOSIS — D689 Coagulation defect, unspecified: Secondary | ICD-10-CM | POA: Diagnosis not present

## 2016-12-21 DIAGNOSIS — L89620 Pressure ulcer of left heel, unstageable: Secondary | ICD-10-CM | POA: Diagnosis not present

## 2016-12-21 DIAGNOSIS — I70202 Unspecified atherosclerosis of native arteries of extremities, left leg: Secondary | ICD-10-CM | POA: Diagnosis not present

## 2016-12-21 DIAGNOSIS — E1122 Type 2 diabetes mellitus with diabetic chronic kidney disease: Secondary | ICD-10-CM | POA: Diagnosis not present

## 2016-12-21 DIAGNOSIS — N186 End stage renal disease: Secondary | ICD-10-CM | POA: Diagnosis not present

## 2016-12-21 DIAGNOSIS — J449 Chronic obstructive pulmonary disease, unspecified: Secondary | ICD-10-CM | POA: Diagnosis not present

## 2016-12-21 DIAGNOSIS — I120 Hypertensive chronic kidney disease with stage 5 chronic kidney disease or end stage renal disease: Secondary | ICD-10-CM | POA: Diagnosis not present

## 2016-12-21 DIAGNOSIS — E11621 Type 2 diabetes mellitus with foot ulcer: Secondary | ICD-10-CM | POA: Diagnosis not present

## 2016-12-21 DIAGNOSIS — L97422 Non-pressure chronic ulcer of left heel and midfoot with fat layer exposed: Secondary | ICD-10-CM | POA: Diagnosis not present

## 2016-12-22 DIAGNOSIS — L89620 Pressure ulcer of left heel, unstageable: Secondary | ICD-10-CM | POA: Diagnosis not present

## 2016-12-22 DIAGNOSIS — Z283 Underimmunization status: Secondary | ICD-10-CM | POA: Diagnosis not present

## 2016-12-22 DIAGNOSIS — N186 End stage renal disease: Secondary | ICD-10-CM | POA: Diagnosis not present

## 2016-12-22 DIAGNOSIS — I132 Hypertensive heart and chronic kidney disease with heart failure and with stage 5 chronic kidney disease, or end stage renal disease: Secondary | ICD-10-CM | POA: Diagnosis not present

## 2016-12-22 DIAGNOSIS — D509 Iron deficiency anemia, unspecified: Secondary | ICD-10-CM | POA: Diagnosis not present

## 2016-12-22 DIAGNOSIS — D631 Anemia in chronic kidney disease: Secondary | ICD-10-CM | POA: Diagnosis not present

## 2016-12-22 DIAGNOSIS — E1122 Type 2 diabetes mellitus with diabetic chronic kidney disease: Secondary | ICD-10-CM | POA: Diagnosis not present

## 2016-12-22 DIAGNOSIS — D689 Coagulation defect, unspecified: Secondary | ICD-10-CM | POA: Diagnosis not present

## 2016-12-22 DIAGNOSIS — D649 Anemia, unspecified: Secondary | ICD-10-CM | POA: Diagnosis not present

## 2016-12-22 DIAGNOSIS — J449 Chronic obstructive pulmonary disease, unspecified: Secondary | ICD-10-CM | POA: Diagnosis not present

## 2016-12-22 DIAGNOSIS — I5032 Chronic diastolic (congestive) heart failure: Secondary | ICD-10-CM | POA: Diagnosis not present

## 2016-12-25 DIAGNOSIS — D649 Anemia, unspecified: Secondary | ICD-10-CM | POA: Diagnosis not present

## 2016-12-25 DIAGNOSIS — D631 Anemia in chronic kidney disease: Secondary | ICD-10-CM | POA: Diagnosis not present

## 2016-12-25 DIAGNOSIS — D689 Coagulation defect, unspecified: Secondary | ICD-10-CM | POA: Diagnosis not present

## 2016-12-25 DIAGNOSIS — N186 End stage renal disease: Secondary | ICD-10-CM | POA: Diagnosis not present

## 2016-12-25 DIAGNOSIS — Z283 Underimmunization status: Secondary | ICD-10-CM | POA: Diagnosis not present

## 2016-12-25 DIAGNOSIS — D509 Iron deficiency anemia, unspecified: Secondary | ICD-10-CM | POA: Diagnosis not present

## 2016-12-26 DIAGNOSIS — I132 Hypertensive heart and chronic kidney disease with heart failure and with stage 5 chronic kidney disease, or end stage renal disease: Secondary | ICD-10-CM | POA: Diagnosis not present

## 2016-12-26 DIAGNOSIS — E1122 Type 2 diabetes mellitus with diabetic chronic kidney disease: Secondary | ICD-10-CM | POA: Diagnosis not present

## 2016-12-26 DIAGNOSIS — N186 End stage renal disease: Secondary | ICD-10-CM | POA: Diagnosis not present

## 2016-12-26 DIAGNOSIS — I5032 Chronic diastolic (congestive) heart failure: Secondary | ICD-10-CM | POA: Diagnosis not present

## 2016-12-26 DIAGNOSIS — L89620 Pressure ulcer of left heel, unstageable: Secondary | ICD-10-CM | POA: Diagnosis not present

## 2016-12-26 DIAGNOSIS — J449 Chronic obstructive pulmonary disease, unspecified: Secondary | ICD-10-CM | POA: Diagnosis not present

## 2016-12-27 DIAGNOSIS — N186 End stage renal disease: Secondary | ICD-10-CM | POA: Diagnosis not present

## 2016-12-27 DIAGNOSIS — D649 Anemia, unspecified: Secondary | ICD-10-CM | POA: Diagnosis not present

## 2016-12-27 DIAGNOSIS — D631 Anemia in chronic kidney disease: Secondary | ICD-10-CM | POA: Diagnosis not present

## 2016-12-27 DIAGNOSIS — D509 Iron deficiency anemia, unspecified: Secondary | ICD-10-CM | POA: Diagnosis not present

## 2016-12-27 DIAGNOSIS — Z283 Underimmunization status: Secondary | ICD-10-CM | POA: Diagnosis not present

## 2016-12-27 DIAGNOSIS — D689 Coagulation defect, unspecified: Secondary | ICD-10-CM | POA: Diagnosis not present

## 2016-12-28 ENCOUNTER — Ambulatory Visit (HOSPITAL_COMMUNITY)
Admission: RE | Admit: 2016-12-28 | Discharge: 2016-12-28 | Disposition: A | Discharge status: Home / Self Care | Payer: Medicare Other | Source: Ambulatory Visit | Attending: Nurse Practitioner | Admitting: Nurse Practitioner

## 2016-12-28 ENCOUNTER — Other Ambulatory Visit: Payer: Self-pay | Admitting: Nurse Practitioner

## 2016-12-28 DIAGNOSIS — L97422 Non-pressure chronic ulcer of left heel and midfoot with fat layer exposed: Secondary | ICD-10-CM | POA: Diagnosis not present

## 2016-12-28 DIAGNOSIS — I70202 Unspecified atherosclerosis of native arteries of extremities, left leg: Secondary | ICD-10-CM | POA: Diagnosis not present

## 2016-12-28 DIAGNOSIS — E11621 Type 2 diabetes mellitus with foot ulcer: Secondary | ICD-10-CM | POA: Diagnosis not present

## 2016-12-28 DIAGNOSIS — I120 Hypertensive chronic kidney disease with stage 5 chronic kidney disease or end stage renal disease: Secondary | ICD-10-CM | POA: Diagnosis not present

## 2016-12-28 DIAGNOSIS — L97529 Non-pressure chronic ulcer of other part of left foot with unspecified severity: Secondary | ICD-10-CM | POA: Diagnosis not present

## 2016-12-28 DIAGNOSIS — N186 End stage renal disease: Secondary | ICD-10-CM | POA: Diagnosis not present

## 2016-12-28 DIAGNOSIS — M868X7 Other osteomyelitis, ankle and foot: Secondary | ICD-10-CM | POA: Diagnosis not present

## 2016-12-28 DIAGNOSIS — S91302A Unspecified open wound, left foot, initial encounter: Secondary | ICD-10-CM | POA: Insufficient documentation

## 2016-12-28 DIAGNOSIS — M869 Osteomyelitis, unspecified: Secondary | ICD-10-CM

## 2016-12-28 DIAGNOSIS — L89620 Pressure ulcer of left heel, unstageable: Secondary | ICD-10-CM | POA: Diagnosis not present

## 2016-12-28 DIAGNOSIS — E1122 Type 2 diabetes mellitus with diabetic chronic kidney disease: Secondary | ICD-10-CM | POA: Diagnosis not present

## 2016-12-28 DIAGNOSIS — J449 Chronic obstructive pulmonary disease, unspecified: Secondary | ICD-10-CM | POA: Diagnosis not present

## 2016-12-29 DIAGNOSIS — Z283 Underimmunization status: Secondary | ICD-10-CM | POA: Diagnosis not present

## 2016-12-29 DIAGNOSIS — L89620 Pressure ulcer of left heel, unstageable: Secondary | ICD-10-CM | POA: Diagnosis not present

## 2016-12-29 DIAGNOSIS — E1122 Type 2 diabetes mellitus with diabetic chronic kidney disease: Secondary | ICD-10-CM | POA: Diagnosis not present

## 2016-12-29 DIAGNOSIS — I132 Hypertensive heart and chronic kidney disease with heart failure and with stage 5 chronic kidney disease, or end stage renal disease: Secondary | ICD-10-CM | POA: Diagnosis not present

## 2016-12-29 DIAGNOSIS — D631 Anemia in chronic kidney disease: Secondary | ICD-10-CM | POA: Diagnosis not present

## 2016-12-29 DIAGNOSIS — I5032 Chronic diastolic (congestive) heart failure: Secondary | ICD-10-CM | POA: Diagnosis not present

## 2016-12-29 DIAGNOSIS — J449 Chronic obstructive pulmonary disease, unspecified: Secondary | ICD-10-CM | POA: Diagnosis not present

## 2016-12-29 DIAGNOSIS — D689 Coagulation defect, unspecified: Secondary | ICD-10-CM | POA: Diagnosis not present

## 2016-12-29 DIAGNOSIS — N186 End stage renal disease: Secondary | ICD-10-CM | POA: Diagnosis not present

## 2016-12-29 DIAGNOSIS — D649 Anemia, unspecified: Secondary | ICD-10-CM | POA: Diagnosis not present

## 2016-12-29 DIAGNOSIS — D509 Iron deficiency anemia, unspecified: Secondary | ICD-10-CM | POA: Diagnosis not present

## 2017-01-01 DIAGNOSIS — I5032 Chronic diastolic (congestive) heart failure: Secondary | ICD-10-CM | POA: Diagnosis not present

## 2017-01-01 DIAGNOSIS — Z283 Underimmunization status: Secondary | ICD-10-CM | POA: Diagnosis not present

## 2017-01-01 DIAGNOSIS — D631 Anemia in chronic kidney disease: Secondary | ICD-10-CM | POA: Diagnosis not present

## 2017-01-01 DIAGNOSIS — E1122 Type 2 diabetes mellitus with diabetic chronic kidney disease: Secondary | ICD-10-CM | POA: Diagnosis not present

## 2017-01-01 DIAGNOSIS — N186 End stage renal disease: Secondary | ICD-10-CM | POA: Diagnosis not present

## 2017-01-01 DIAGNOSIS — L89620 Pressure ulcer of left heel, unstageable: Secondary | ICD-10-CM | POA: Diagnosis not present

## 2017-01-01 DIAGNOSIS — D649 Anemia, unspecified: Secondary | ICD-10-CM | POA: Diagnosis not present

## 2017-01-01 DIAGNOSIS — D689 Coagulation defect, unspecified: Secondary | ICD-10-CM | POA: Diagnosis not present

## 2017-01-01 DIAGNOSIS — D509 Iron deficiency anemia, unspecified: Secondary | ICD-10-CM | POA: Diagnosis not present

## 2017-01-01 DIAGNOSIS — I132 Hypertensive heart and chronic kidney disease with heart failure and with stage 5 chronic kidney disease, or end stage renal disease: Secondary | ICD-10-CM | POA: Diagnosis not present

## 2017-01-01 DIAGNOSIS — J449 Chronic obstructive pulmonary disease, unspecified: Secondary | ICD-10-CM | POA: Diagnosis not present

## 2017-01-02 ENCOUNTER — Encounter (HOSPITAL_COMMUNITY): Payer: Self-pay | Admitting: *Deleted

## 2017-01-02 ENCOUNTER — Telehealth: Payer: Self-pay | Admitting: Vascular Surgery

## 2017-01-02 ENCOUNTER — Ambulatory Visit (HOSPITAL_COMMUNITY): Payer: Medicare Other | Admitting: Certified Registered Nurse Anesthetist

## 2017-01-02 ENCOUNTER — Encounter (HOSPITAL_COMMUNITY)
Admission: RE | Disposition: A | Discharge status: Home / Self Care | Payer: Self-pay | Source: Ambulatory Visit | Attending: Vascular Surgery

## 2017-01-02 ENCOUNTER — Ambulatory Visit (HOSPITAL_COMMUNITY)
Admission: RE | Admit: 2017-01-02 | Discharge: 2017-01-02 | Disposition: A | Discharge status: Home / Self Care | Payer: Medicare Other | Source: Ambulatory Visit | Attending: Vascular Surgery | Admitting: Vascular Surgery

## 2017-01-02 DIAGNOSIS — I132 Hypertensive heart and chronic kidney disease with heart failure and with stage 5 chronic kidney disease, or end stage renal disease: Secondary | ICD-10-CM | POA: Diagnosis not present

## 2017-01-02 DIAGNOSIS — Z87891 Personal history of nicotine dependence: Secondary | ICD-10-CM | POA: Insufficient documentation

## 2017-01-02 DIAGNOSIS — Z9989 Dependence on other enabling machines and devices: Secondary | ICD-10-CM | POA: Diagnosis not present

## 2017-01-02 DIAGNOSIS — E113599 Type 2 diabetes mellitus with proliferative diabetic retinopathy without macular edema, unspecified eye: Secondary | ICD-10-CM | POA: Insufficient documentation

## 2017-01-02 DIAGNOSIS — I2510 Atherosclerotic heart disease of native coronary artery without angina pectoris: Secondary | ICD-10-CM | POA: Insufficient documentation

## 2017-01-02 DIAGNOSIS — G4730 Sleep apnea, unspecified: Secondary | ICD-10-CM | POA: Diagnosis not present

## 2017-01-02 DIAGNOSIS — Z6841 Body Mass Index (BMI) 40.0 and over, adult: Secondary | ICD-10-CM | POA: Diagnosis not present

## 2017-01-02 DIAGNOSIS — I69351 Hemiplegia and hemiparesis following cerebral infarction affecting right dominant side: Secondary | ICD-10-CM | POA: Diagnosis not present

## 2017-01-02 DIAGNOSIS — E039 Hypothyroidism, unspecified: Secondary | ICD-10-CM | POA: Diagnosis not present

## 2017-01-02 DIAGNOSIS — E1151 Type 2 diabetes mellitus with diabetic peripheral angiopathy without gangrene: Secondary | ICD-10-CM | POA: Insufficient documentation

## 2017-01-02 DIAGNOSIS — E1140 Type 2 diabetes mellitus with diabetic neuropathy, unspecified: Secondary | ICD-10-CM | POA: Diagnosis not present

## 2017-01-02 DIAGNOSIS — I252 Old myocardial infarction: Secondary | ICD-10-CM | POA: Insufficient documentation

## 2017-01-02 DIAGNOSIS — J449 Chronic obstructive pulmonary disease, unspecified: Secondary | ICD-10-CM | POA: Diagnosis not present

## 2017-01-02 DIAGNOSIS — Z992 Dependence on renal dialysis: Secondary | ICD-10-CM | POA: Insufficient documentation

## 2017-01-02 DIAGNOSIS — I5032 Chronic diastolic (congestive) heart failure: Secondary | ICD-10-CM | POA: Diagnosis not present

## 2017-01-02 DIAGNOSIS — I509 Heart failure, unspecified: Secondary | ICD-10-CM | POA: Diagnosis not present

## 2017-01-02 DIAGNOSIS — E119 Type 2 diabetes mellitus without complications: Secondary | ICD-10-CM | POA: Diagnosis not present

## 2017-01-02 DIAGNOSIS — Z7982 Long term (current) use of aspirin: Secondary | ICD-10-CM | POA: Diagnosis not present

## 2017-01-02 DIAGNOSIS — Z9981 Dependence on supplemental oxygen: Secondary | ICD-10-CM | POA: Diagnosis not present

## 2017-01-02 DIAGNOSIS — I120 Hypertensive chronic kidney disease with stage 5 chronic kidney disease or end stage renal disease: Secondary | ICD-10-CM | POA: Diagnosis not present

## 2017-01-02 DIAGNOSIS — E1122 Type 2 diabetes mellitus with diabetic chronic kidney disease: Secondary | ICD-10-CM | POA: Diagnosis not present

## 2017-01-02 DIAGNOSIS — Z794 Long term (current) use of insulin: Secondary | ICD-10-CM | POA: Diagnosis not present

## 2017-01-02 DIAGNOSIS — Z79899 Other long term (current) drug therapy: Secondary | ICD-10-CM | POA: Diagnosis not present

## 2017-01-02 DIAGNOSIS — N185 Chronic kidney disease, stage 5: Secondary | ICD-10-CM | POA: Diagnosis not present

## 2017-01-02 DIAGNOSIS — I69398 Other sequelae of cerebral infarction: Secondary | ICD-10-CM | POA: Diagnosis not present

## 2017-01-02 DIAGNOSIS — N186 End stage renal disease: Secondary | ICD-10-CM | POA: Diagnosis not present

## 2017-01-02 DIAGNOSIS — L89620 Pressure ulcer of left heel, unstageable: Secondary | ICD-10-CM | POA: Diagnosis not present

## 2017-01-02 DIAGNOSIS — K219 Gastro-esophageal reflux disease without esophagitis: Secondary | ICD-10-CM | POA: Diagnosis not present

## 2017-01-02 HISTORY — DX: Family history of other specified conditions: Z84.89

## 2017-01-02 HISTORY — PX: AV FISTULA PLACEMENT: SHX1204

## 2017-01-02 HISTORY — DX: Dyspnea, unspecified: R06.00

## 2017-01-02 LAB — GLUCOSE, CAPILLARY
GLUCOSE-CAPILLARY: 242 mg/dL — AB (ref 65–99)
Glucose-Capillary: 241 mg/dL — ABNORMAL HIGH (ref 65–99)

## 2017-01-02 LAB — POCT I-STAT 4, (NA,K, GLUC, HGB,HCT)
Glucose, Bld: 279 mg/dL — ABNORMAL HIGH (ref 65–99)
HCT: 34 % — ABNORMAL LOW (ref 36.0–46.0)
Hemoglobin: 11.6 g/dL — ABNORMAL LOW (ref 12.0–15.0)
Potassium: 4.1 mmol/L (ref 3.5–5.1)
Sodium: 136 mmol/L (ref 135–145)

## 2017-01-02 SURGERY — ARTERIOVENOUS (AV) FISTULA CREATION
Anesthesia: Monitor Anesthesia Care | Site: Arm Upper | Laterality: Right

## 2017-01-02 SURGERY — ARTERIOVENOUS (AV) FISTULA CREATION
Anesthesia: Monitor Anesthesia Care | Laterality: Right

## 2017-01-02 MED ORDER — ALBUMIN HUMAN 5 % IV SOLN
INTRAVENOUS | Status: AC
  Filled 2017-01-02: qty 250

## 2017-01-02 MED ORDER — PROPOFOL 10 MG/ML IV BOLUS
INTRAVENOUS | Status: AC
  Filled 2017-01-02: qty 20

## 2017-01-02 MED ORDER — FENTANYL CITRATE (PF) 250 MCG/5ML IJ SOLN
INTRAMUSCULAR | Status: AC
  Filled 2017-01-02: qty 5

## 2017-01-02 MED ORDER — MIDAZOLAM HCL 2 MG/2ML IJ SOLN
INTRAMUSCULAR | Status: AC
  Filled 2017-01-02: qty 2

## 2017-01-02 MED ORDER — LIDOCAINE HCL 1 % IJ SOLN
INTRAMUSCULAR | Status: AC
  Filled 2017-01-02: qty 20

## 2017-01-02 MED ORDER — PROTAMINE SULFATE 10 MG/ML IV SOLN
INTRAVENOUS | Status: AC
  Filled 2017-01-02: qty 10

## 2017-01-02 MED ORDER — SODIUM CHLORIDE 0.9 % IV SOLN
INTRAVENOUS | Status: DC
  Administered 2017-01-02: 08:00:00 via INTRAVENOUS

## 2017-01-02 MED ORDER — HEPARIN SODIUM (PORCINE) 1000 UNIT/ML IJ SOLN
INTRAMUSCULAR | Status: AC
  Filled 2017-01-02: qty 4

## 2017-01-02 MED ORDER — THROMBIN 20000 UNITS EX SOLR
CUTANEOUS | Status: DC | PRN
  Administered 2017-01-02: 20 mL via TOPICAL

## 2017-01-02 MED ORDER — DEXTROSE 5 % IV SOLN
1.5000 g | INTRAVENOUS | Status: AC
  Administered 2017-01-02: 1.5 g via INTRAVENOUS
  Filled 2017-01-02: qty 1.5

## 2017-01-02 MED ORDER — THROMBIN 20000 UNITS EX SOLR
CUTANEOUS | Status: AC
  Filled 2017-01-02: qty 20000

## 2017-01-02 MED ORDER — SODIUM CHLORIDE 0.9 % IV SOLN
INTRAVENOUS | Status: DC | PRN
  Administered 2017-01-02: 500 mL

## 2017-01-02 MED ORDER — PAPAVERINE HCL 30 MG/ML IJ SOLN
INTRAMUSCULAR | Status: DC | PRN
  Administered 2017-01-02: 60 mg

## 2017-01-02 MED ORDER — CHLORHEXIDINE GLUCONATE CLOTH 2 % EX PADS: 6.0000 | MEDICATED_PAD | Freq: Once | CUTANEOUS | Status: DC

## 2017-01-02 MED ORDER — HEPARIN SODIUM (PORCINE) 1000 UNIT/ML IJ SOLN
INTRAMUSCULAR | Status: DC | PRN
  Administered 2017-01-02: 8000 [IU] via INTRAVENOUS

## 2017-01-02 MED ORDER — OXYCODONE-ACETAMINOPHEN 5-325 MG PO TABS: 1.0000 | ORAL_TABLET | ORAL | 0 refills | Status: DC | PRN

## 2017-01-02 MED ORDER — PROTAMINE SULFATE 10 MG/ML IV SOLN
INTRAVENOUS | Status: DC | PRN
  Administered 2017-01-02: 30 mg via INTRAVENOUS

## 2017-01-02 MED ORDER — LIDOCAINE HCL 1 % IJ SOLN
INTRAMUSCULAR | Status: AC
  Filled 2017-01-02: qty 40

## 2017-01-02 MED ORDER — PAPAVERINE HCL 30 MG/ML IJ SOLN
INTRAMUSCULAR | Status: AC
  Filled 2017-01-02: qty 2

## 2017-01-02 MED ORDER — ALBUMIN HUMAN 5 % IV SOLN
12.5000 g | Freq: Once | INTRAVENOUS | Status: AC
  Administered 2017-01-02: 12.5 g via INTRAVENOUS

## 2017-01-02 MED ORDER — ONDANSETRON HCL 4 MG/2ML IJ SOLN: 4.0000 mg | Freq: Once | INTRAMUSCULAR | Status: DC | PRN

## 2017-01-02 MED ORDER — 0.9 % SODIUM CHLORIDE (POUR BTL) OPTIME
TOPICAL | Status: DC | PRN
  Administered 2017-01-02: 1000 mL

## 2017-01-02 MED ORDER — FENTANYL CITRATE (PF) 100 MCG/2ML IJ SOLN: 25.0000 ug | INTRAMUSCULAR | Status: DC | PRN

## 2017-01-02 MED ORDER — LIDOCAINE HCL (PF) 1 % IJ SOLN
INTRAMUSCULAR | Status: DC | PRN
Start: 2017-01-02 — End: 2017-01-02
  Administered 2017-01-02: 40 mL
  Administered 2017-01-02: 6 mL

## 2017-01-02 SURGICAL SUPPLY — 39 items
ARMBAND PINK RESTRICT EXTREMIT (MISCELLANEOUS) ×6 IMPLANT
CANISTER SUCT 3000ML PPV (MISCELLANEOUS) ×3 IMPLANT
CANNULA VESSEL 3MM 2 BLNT TIP (CANNULA) ×6 IMPLANT
CLIP TI MEDIUM 6 (CLIP) ×3 IMPLANT
CLIP TI WIDE RED SMALL 6 (CLIP) ×3 IMPLANT
COVER PROBE W GEL 5X96 (DRAPES) IMPLANT
DECANTER SPIKE VIAL GLASS SM (MISCELLANEOUS) ×6 IMPLANT
DERMABOND ADVANCED (GAUZE/BANDAGES/DRESSINGS) ×2
DERMABOND ADVANCED .7 DNX12 (GAUZE/BANDAGES/DRESSINGS) ×1 IMPLANT
ELECT REM PT RETURN 9FT ADLT (ELECTROSURGICAL) ×3
ELECTRODE REM PT RTRN 9FT ADLT (ELECTROSURGICAL) ×1 IMPLANT
GAUZE SPONGE 4X4 16PLY XRAY LF (GAUZE/BANDAGES/DRESSINGS) ×3 IMPLANT
GLOVE BIO SURGEON STRL SZ7.5 (GLOVE) ×3 IMPLANT
GLOVE BIOGEL PI IND STRL 6.5 (GLOVE) ×1 IMPLANT
GLOVE BIOGEL PI IND STRL 7.0 (GLOVE) ×2 IMPLANT
GLOVE BIOGEL PI IND STRL 8 (GLOVE) ×1 IMPLANT
GLOVE BIOGEL PI INDICATOR 6.5 (GLOVE) ×2
GLOVE BIOGEL PI INDICATOR 7.0 (GLOVE) ×4
GLOVE BIOGEL PI INDICATOR 8 (GLOVE) ×2
GLOVE SS N UNI LF 6.0 STRL (GLOVE) ×3 IMPLANT
GLOVE SS N UNI LF 6.5 STRL (GLOVE) ×3 IMPLANT
GLOVE SS N UNI LF 7.0 STRL (GLOVE) ×3 IMPLANT
GOWN STRL REUS W/ TWL LRG LVL3 (GOWN DISPOSABLE) ×3 IMPLANT
GOWN STRL REUS W/TWL LRG LVL3 (GOWN DISPOSABLE) ×6
GRAFT GORETEX STRT 4-7X45 (Vascular Products) ×3 IMPLANT
KIT BASIN OR (CUSTOM PROCEDURE TRAY) ×3 IMPLANT
KIT ROOM TURNOVER OR (KITS) ×3 IMPLANT
NEEDLE 18GX1X1/2 (RX/OR ONLY) (NEEDLE) ×3 IMPLANT
NS IRRIG 1000ML POUR BTL (IV SOLUTION) ×3 IMPLANT
PACK CV ACCESS (CUSTOM PROCEDURE TRAY) ×3 IMPLANT
PAD ARMBOARD 7.5X6 YLW CONV (MISCELLANEOUS) ×6 IMPLANT
SPONGE SURGIFOAM ABS GEL 100 (HEMOSTASIS) ×3 IMPLANT
SUT PROLENE 6 0 BV (SUTURE) ×12 IMPLANT
SUT VIC AB 3-0 SH 27 (SUTURE) ×2
SUT VIC AB 3-0 SH 27X BRD (SUTURE) ×1 IMPLANT
SUT VICRYL 4-0 PS2 18IN ABS (SUTURE) ×3 IMPLANT
SYR 5ML LUER SLIP (SYRINGE) ×3 IMPLANT
UNDERPAD 30X30 (UNDERPADS AND DIAPERS) ×3 IMPLANT
WATER STERILE IRR 1000ML POUR (IV SOLUTION) ×3 IMPLANT

## 2017-01-02 NOTE — H&P (View-Only) (Signed)
Patient name: Kimberly [name] MRN: [id number] DOB: [birth year] Sex: female  REASON FOR VISIT: To evaluate for new hemodialysis access. Referred by Dr. Paulene Floor  HPI: Kimberly [name] is a 61 y.o. female who I had seen in October 2016 in order to evaluate her for hemodialysis access. She has end-stage renal disease secondary to diabetes. She has a complicated history in that she has diastolic right-sided heart failure, and COPD. She's had previous carotid endarterectomy and redo carotid endarterectomy by Dr. Hart Rochester. Look like her best option for fistula wasn't upper arm brachiocephalic fistula on the right. She was taken to the operative room on 07/08/2015 however she had an intraoperative cardiac arrest and the procedure was aborted.  She comes in to discuss new access. She was recently hospitalized with an infection it, but they could not find the source of infection. She has a left IJ tunnel dialysis catheter which she's had for about 2 months.  She's had a previous injury in her left arm and has poor circulation on the left according to her.  She denies any recent uremic symptoms. Specifically, she denies nausea, vomiting, fatigue, anorexia, or palpitations.  Past Medical History:  Diagnosis Date  . Anemia   . Anxiety   . Arthritis    "knees, feet, hands" (08/23/2016)  . Asthma   . CAD, NATIVE VESSEL    May 10, 2010 cath showed a hyperdynamic LV function, she had dominant circumflex anatomy with a 70-80% small OM1. She had diffuse diabetic plaque particularly in the distal LAD. She nondominant RCA.  Nondominant  . Cellulitis 10/15/2013  . CHF (congestive heart failure) (HCC)    Preserved EF  . Chronic bronchitis (HCC)    "probably once/ yr" (08/23/2016)  . Complication of anesthesia    "I've had difficulty waking up" (08/23/2016)  . COPD   . Depression   . Diabetic neuropathy (HCC)   . Endometrial hyperplasia   . ESRD (end stage renal disease) on dialysis (HCC)    "[facility];  MWF; [facility]" (08/23/2016)  . GERD (gastroesophageal reflux disease)   . History of hiatal hernia   . HYPERLIPIDEMIA   . HYPERTENSION   . Hypothyroidism   . Myocardial infarct, old   . On home oxygen therapy    "2L; 24/7" (08/23/2016)  . Paralyzed vocal cords   . Peripheral neuropathy (HCC)    hx/notes 01/27/2010  . Pneumonia "several times"  . Proliferative retinopathy    hx/notes 01/27/2010  . PVD    CEA  . Restless leg syndrome    "mostly on the right" (08/23/2016)  . Sleep apnea    not on cpap    . 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 Type II diabetes mellitus (HCC)     Family History  Problem Relation Age of Onset  . Cancer Mother     Breast, NHL  . Stroke Mother   . Peripheral vascular disease Father   . CAD Father 75  . Heart attack Father   . Hypertension Father   . Asthma Father   . Heart disease Father     before age 60    SOCIAL HISTORY: Social History  Substance Use Topics  . Smoking status: Former Smoker    Packs/day: 1.00    Years: 20.00    Types: Cigarettes    Quit date: 09/11/2005  . Smokeless  tobacco: Never Used  . Alcohol use No    Allergies  Allergen Reactions  . Ciprofloxacin Itching  . Epinephrine     Increased heart rate    Current Outpatient Prescriptions  Medication Sig Dispense Refill  . acetaminophen (TYLENOL) 325 MG tablet Take 650 mg by mouth every 6 (six) hours as needed for mild pain.    Marland Kitchen aspirin EC 81 MG tablet Take 81 mg by mouth daily.    Marland Kitchen docusate sodium (COLACE) 100 MG capsule Take 1 capsule (100 mg total) by mouth daily. 10 capsule   . guaiFENesin-dextromethorphan (ROBITUSSIN DM) 100-10 MG/5ML syrup Take 10 mLs by mouth every 6 (six) hours as needed for cough.    . hydrocerin (EUCERIN) CREA Apply 1 application topically 2 (two) times daily. 113 g 0  . hydrocortisone (ANUSOL-HC) 25 MG suppository  Place 25 mg rectally 2 (two) times daily as needed for hemorrhoids or itching.    . insulin glargine (LANTUS) 100 UNIT/ML injection Inject 0.4 mLs (40 Units total) into the skin 2 (two) times daily. 10 mL 11  . insulin lispro (HUMALOG) 100 UNIT/ML injection Inject 6 Units into the skin 3 (three) times daily before meals.    Marland Kitchen ipratropium-albuterol (DUONEB) 0.5-2.5 (3) MG/3ML SOLN Take 3 mLs by nebulization every 8 (eight) hours as needed.    Marland Kitchen levonorgestrel (MIRENA) 20 MCG/24HR IUD 1 Intra Uterine Device (1 each total) by Intrauterine route once. 1 each 0  . levothyroxine (SYNTHROID, LEVOTHROID) 88 MCG tablet Take 1 tablet (88 mcg total) by mouth daily before breakfast.    . midodrine (PROAMATINE) 10 MG tablet Take 1 tablet (10 mg total) by mouth 2 (two) times daily with a meal. 60 tablet 0  . multivitamin (RENA-VIT) TABS tablet Take 1 tablet by mouth daily.    . OXYGEN 2lpm 24/7- [facility]    . senna (SENOKOT) 8.6 MG tablet Take 2 tablets by mouth at bedtime.    . sevelamer carbonate (RENVELA) 2.4 g PACK Take 2.4 g by mouth 3 (three) times daily with meals. 90 each 0  . sodium chloride (OCEAN) 0.65 % SOLN nasal spray Place 1 spray into both nostrils as needed for congestion.     No current facility-administered medications for this visit.     REVIEW OF SYSTEMS:   denotes positive finding,  denotes negative finding Cardiac  Comments:  Chest pain or chest pressure:    Shortness of breath upon exertion:    Short of breath when lying flat:    Irregular heart rhythm:        Vascular    Pain in calf, thigh, or hip brought on by ambulation:    Pain in feet at night that wakes you up from your sleep:     Blood clot in your veins:    Leg swelling:         Pulmonary    Oxygen at home:    Productive cough:     Wheezing:         Neurologic    Sudden weakness in arms or legs:     Sudden numbness in arms or legs:     Sudden onset of difficulty speaking or slurred speech:    Temporary loss  of vision in one eye:     Problems with dizziness:         Gastrointestinal    Blood in stool:     Vomited blood:         Genitourinary  Burning when urinating:     Blood in urine:        Psychiatric    Major depression:         Hematologic    Bleeding problems:    Problems with blood clotting too easily:        Skin    Rashes or ulcers:        Constitutional    Fever or chills:      PHYSICAL EXAM: Vitals:   12/06/16 0822  BP: (!) 101/47  Pulse: 85  Resp: 18  Weight: 275 lb (124.7 kg)  Height:  (1.626 m)    GENERAL: The patient is a well-nourished female, in no acute distress. The vital signs are documented above. CARDIAC: There is a regular rate and rhythm.  VASCULAR: She has a palpable right radial pulse. I cannot palpate a left radial pulse. PULMONARY: There is good air exchange bilaterally without wheezing or rales. ABDOMEN: Soft and non-tender with normal pitched bowel sounds.  MUSCULOSKELETAL: There are no major deformities or cyanosis. NEUROLOGIC: No focal weakness or paresthesias are detected. SKIN: There are no ulcers or rashes noted. PSYCHIATRIC: The patient has a normal affect.  DATA:   I reviewed her previous vein map and it appeared that her best option for a fistula was the brachiocephalic fistula on the right. At the time of exploration in 2016, all of her veins were small including her axillary vein.  MEDICAL ISSUES:  END-STAGE RENAL DISEASE: The patient is not a good candidate for access in the left arm because of the diminished radial pulse and a history of trauma to the arm. She had very small arteries and veins at the time of exploration in 2016. However, I think it would be reasonable to attempt an upper arm graft on the right. She may potentially require a loop graft given that her brachial artery was found to be very small. As noted above she had a cardiac arrest and we attempted her access in 2016 and also after apparently a carotid  endarterectomy in the past. Therefore she is very hasn't consider proceeding with surgery and needs to think about this further. She also just got out of rehabilitation and once he gets stronger. We will be happy to schedule placement of an upper arm graft in the right arm under local anesthesia in the future when she is agreeable to proceed. We have discussed the procedure and potential complications.    Waverly Ferrari [facility] [phone]

## 2017-01-02 NOTE — Interval H&P Note (Signed)
History and Physical Interval Note:  01/02/2017 9:45 AM  [name]  has presented today for surgery, with the diagnosis of CKD  The various methods of treatment have been discussed with the patient and family. After consideration of risks, benefits and other options for treatment, the patient has consented to  Procedure(s): ARTERIOVENOUS (AV) FISTULA CREATION VS. GRAFT INSERTION RIGHT ARM (Right) as a surgical intervention .  The patient's history has been reviewed, patient examined, no change in status, stable for surgery.  I have reviewed the patient's chart and labs.  Questions were answered to the patient's satisfaction.     Kimberly [name]

## 2017-01-02 NOTE — Telephone Encounter (Signed)
Scheduled 5/16 @ 1:30pm

## 2017-01-02 NOTE — Anesthesia Preprocedure Evaluation (Addendum)
Anesthesia Evaluation  Patient identified by MRN, date of birth, ID band Patient awake    Reviewed: Allergy & Precautions, NPO status , Patient's Chart, lab work & pertinent test results, reviewed documented beta blocker date and time   Airway Mallampati: III  TM Distance: >3 FB Neck ROM: Full    Dental  (+) Teeth Intact   Pulmonary shortness of breath, asthma , sleep apnea, Continuous Positive Airway Pressure Ventilation and Oxygen sleep apnea , COPD,  COPD inhaler and oxygen dependent, former smoker,    breath sounds clear to auscultation       Cardiovascular hypertension, Pt. on medications and Pt. on home beta blockers + CAD, + Past MI, + Peripheral Vascular Disease and +CHF   Rhythm:Regular Rate:Normal  Echo 06/2015: Study Conclusions  - Left ventricle: The cavity size was normal. There was mild concentric hypertrophy. Systolic function was normal. The estimated ejection fraction was in the range of 55% to 60%. Wall motion was normal; there were no regional wall motion abnormalities. Features are consistent with a pseudonormal left ventricular filling pattern, with concomitant abnormal relaxation   and increased filling pressure (grade 2 diastolic dysfunction). Doppler parameters are consistent with high ventricular filling pressure. - Aortic valve: Poorly visualized. - Mitral valve: Calcified annulus. Mild diffuse thickening and   calcification of the anterior leaflet. The findings are   consistent with mild stenosis. Valve area by pressure half-time: 2.12 cm^2. Valve area by continuity equation (using LVOT flow): 0.69 cm^2. - Left atrium: Poorly visualized. - Right ventricle: Poorly visualized. - Pulmonic valve: Poorly visualized. - Pulmonary arteries: Poorly visualized. PA peak pressure: 78 mm Hg (S).  Impressions:  - The right ventricular systolic pressure was increased consistent with severe pulmonary hypertension.    Neuro/Psych PSYCHIATRIC DISORDERS Anxiety Depression  Neuromuscular disease CVA    GI/Hepatic GERD  Medicated,  Endo/Other  diabetes, Type 2, Insulin DependentHypothyroidism Morbid obesity  Renal/GU Dialysis and ESRFRenal disease (Dialysis MWF)K+ 4.1  negative genitourinary   Musculoskeletal  (+) Arthritis , Osteoarthritis,    Abdominal   Peds negative pediatric ROS (+)  Hematology  (+) Blood dyscrasia, anemia ,   Anesthesia Other Findings   Reproductive/Obstetrics negative OB ROS                            Lab Results  Component Value Date   WBC 10.1 09/19/2016   HGB 11.6 (L) 01/02/2017   HCT 34.0 (L) 01/02/2017   MCV 106.8 (H) 09/14/2016   PLT 170 10/06/2016   Lab Results  Component Value Date   CREATININE 4.9 (A) 10/06/2016   BUN 24 (A) 10/06/2016   NA 136 01/02/2017   K 4.1 01/02/2017   CL 97 (L) 09/14/2016   CO2 26 09/14/2016   Lab Results  Component Value Date   INR 1.33 07/20/2015   INR 1.35 07/19/2015   INR 1.39 07/18/2015   EKG: sinus tachycardia.  Echo (04/2015): Left ventricle: The cavity size was normal. Wall thickness was normal. Systolic function was normal. The estimated ejection fraction was in the range of 60% to 65%. Wall motion was normal; there were no regional wall motion abnormalities. Features are consistent with a pseudonormal left ventricular filling pattern, with concomitant abnormal relaxation and increased filling pressure (grade 2 diastolic dysfunction). - Ventricular septum: The interventricular septum has a D-shape, suggestive of RV pressure/volume overload. - Aortic valve: Poorly visualized. Probably trileaflet; mildly calcified leaflets. There was no stenosis. - Mitral  valve: Moderately to severely calcified annulus. Minimal to mild mitral stenosis. There was no significant regurgitation. Mean gradient (D): 6 mm Hg. Valve area by pressure half-time: 2.28 cm^2. - Right  ventricle: Poorly visualized. The cavity size was mildly dilated. Systolic function was mildly to moderately reduced. - Right atrium: Poorly visualized. - Tricuspid valve: Peak RV-RA gradient (S): 83 mm Hg. - Pulmonary arteries: PA peak pressure: 91 mm Hg (S). - Systemic veins: IVC measured 2.0 cm with < 50% respirophasic variation, suggesting RA pressure 8 mmHg. - Pericardium, extracardiac: A trivial pericardial effusion was identified posterior to the heart.  Anesthesia Physical  Anesthesia Plan  ASA: IV  Anesthesia Plan: MAC   Post-op Pain Management:    Induction: Intravenous  Airway Management Planned: Natural Airway and Nasal Cannula  Additional Equipment:   Intra-op Plan:   Post-operative Plan:   Informed Consent: I have reviewed the patients History and Physical, chart, labs and discussed the procedure including the risks, benefits and alternatives for the proposed anesthesia with the patient or authorized representative who has indicated his/her understanding and acceptance.   Dental advisory given  Plan Discussed with: CRNA  Anesthesia Plan Comments: (Discussed risks/benefits/alternatives to MAC sedation including need for ventilatory support, hypotension, need for conversion to general anesthesia.  All patient questions answered.  Patient/guardian wishes to proceed.)        Anesthesia Quick Evaluation

## 2017-01-02 NOTE — Anesthesia Postprocedure Evaluation (Signed)
Anesthesia Post Note  Patient: Kimberly [name]  Procedure(s) Performed: Procedure(s) (LRB): INSERTION OF ARTERIOVENOUS GORE-TEX  GRAFT  RIGHT ARM (Right)  Patient location during evaluation: PACU Anesthesia Type: MAC Level of consciousness: awake and alert Pain management: pain level controlled Vital Signs Assessment: post-procedure vital signs reviewed and stable Respiratory status: spontaneous breathing, nonlabored ventilation, respiratory function stable and patient connected to nasal cannula oxygen Cardiovascular status: stable and blood pressure returned to baseline Anesthetic complications: no       Last Vitals:  Vitals:   01/02/17 1357 01/02/17 1400  BP: (!) 94/47 (!) 93/47  Pulse: 97 95  Resp: (!) 22 19  Temp:  36.6 C    Last Pain:  Vitals:   01/02/17 0833  TempSrc: Oral  PainSc:                  Catalina Gravel

## 2017-01-02 NOTE — Transfer of Care (Signed)
Immediate Anesthesia Transfer of Care Note  Patient: ILIANI VEJAR  Procedure(s) Performed: Procedure(s): INSERTION OF ARTERIOVENOUS GORE-TEX  GRAFT  RIGHT ARM (Right)  Patient Location: PACU  Anesthesia Type:mac local  Level of Consciousness: awake, alert , oriented and patient cooperative  Airway & Oxygen Therapy: Patient Spontanous Breathing and Patient connected to nasal cannula oxygen  Post-op Assessment: Report given to RN and Post -op Vital signs reviewed and stable  Post vital signs: Reviewed and stable  Last Vitals:  Vitals:   01/02/17 0833  BP: (!) 112/41  Pulse: 95  Resp: 20  Temp: 36.6 C    Last Pain:  Vitals:   01/02/17 0833  TempSrc: Oral  PainSc:       Patients Stated Pain Goal:  (pt. wouldn't give a pain goal) (74/08/14 4818)  Complications: No apparent anesthesia complications

## 2017-01-02 NOTE — Op Note (Signed)
    NAME: Kimberly [name]   MRN: [id number] DOB: 11/30/1955    DATE OF OPERATION: 01/02/2017  PREOP DIAGNOSIS: End-stage renal disease  POSTOP DIAGNOSIS: Same  PROCEDURE: New right upper arm loop AV graft  SURGEON: Di Kindle. Edilia Bo, MD, FACS  ASSIST: Lianne Cure PA  ANESTHESIA: Local with sedation   EBL: Minimal  INDICATIONS: [name] is a 61 y.o. female who presents for new access.  FINDINGS: Very small upper arm brachial vein and upper arm brachial artery.  TECHNIQUE: The patient was taken to the operative and sedated by anesthesia. The right upper extremity was prepped and draped in usual sterile fashion. After the skin was anesthetized with 1% lidocaine, a longitudinal incision was made in the axilla. Here the high brachial vein was dissected free was very small. I looked for the adjacent vein which was also small-sized selected the more superficial branch. I irrigated the blood vessels with papaverine. The brachial artery was also small. The wound was also very deep. Using one distal counterincision, a 4-7 mm PTFE graft was tunneled in a loop fashion in the upper arm after the skin was anesthetized. The arterial aspect of the graft was along the lateral aspect of the upper arm. The venous aspect of the graft was along the medial aspect of the upper arm. The patient was heparinized. The brachial artery was clamped proximally and distally and longitudinal arteriotomy was made. A segment of the 4 mm end of the graft was excised, the graft spatulated and sewn into side to the artery using continuous 6-0 Prolene suture. The graft was important to appropriate length for anastomosis to the high brachial vein. The vein was ligated distally and spatulated proximally. The graft was cut the appropriate length, spatulated, and sewn end to end to the vein using continuous 6-0 Prolene suture. Completion was a good thrill in the fistula and a radial and ulnar signal with Doppler. The heparin  was partially reversed with protamine. Hemostasis was obtained the wounds. The wounds were closed with a deep layer of 3-0 Vicryl and the skin closed with 4-0 Vicryl. Dermabond was applied. The patient tolerated the procedure well and was transferred to the recovery room in stable condition. All needle and sponge counts were correct.  Waverly Ferrari, MD, FACS [facility]  DATE OF DICTATION:   01/02/2017

## 2017-01-02 NOTE — Telephone Encounter (Signed)
-----   Message from Sharee Pimple, RN sent at 01/02/2017  1:21 PM EDT ----- Regarding: 1-3 weeks   ----- Message ----- From: Chuck Hint, MD Sent: 01/02/2017  12:51 PM To: [facility]vs Charge Pool Subject: charge                                         PROCEDURE: New right upper arm loop AV graft  SURGEON: Di Kindle. Edilia Bo, MD, FACS  ASSIST: Lianne Cure PA  This patient will need to follow up with a physician's assistant in 1-3 weeks. Thank you. CD

## 2017-01-03 ENCOUNTER — Encounter (HOSPITAL_COMMUNITY): Payer: Self-pay | Admitting: Vascular Surgery

## 2017-01-03 DIAGNOSIS — D631 Anemia in chronic kidney disease: Secondary | ICD-10-CM | POA: Diagnosis not present

## 2017-01-03 DIAGNOSIS — E119 Type 2 diabetes mellitus without complications: Secondary | ICD-10-CM | POA: Diagnosis not present

## 2017-01-03 DIAGNOSIS — Z283 Underimmunization status: Secondary | ICD-10-CM | POA: Diagnosis not present

## 2017-01-03 DIAGNOSIS — D509 Iron deficiency anemia, unspecified: Secondary | ICD-10-CM | POA: Diagnosis not present

## 2017-01-03 DIAGNOSIS — D649 Anemia, unspecified: Secondary | ICD-10-CM | POA: Diagnosis not present

## 2017-01-03 DIAGNOSIS — N186 End stage renal disease: Secondary | ICD-10-CM | POA: Diagnosis not present

## 2017-01-03 DIAGNOSIS — D689 Coagulation defect, unspecified: Secondary | ICD-10-CM | POA: Diagnosis not present

## 2017-01-05 DIAGNOSIS — D631 Anemia in chronic kidney disease: Secondary | ICD-10-CM | POA: Diagnosis not present

## 2017-01-05 DIAGNOSIS — Z283 Underimmunization status: Secondary | ICD-10-CM | POA: Diagnosis not present

## 2017-01-05 DIAGNOSIS — L89620 Pressure ulcer of left heel, unstageable: Secondary | ICD-10-CM | POA: Diagnosis not present

## 2017-01-05 DIAGNOSIS — E1122 Type 2 diabetes mellitus with diabetic chronic kidney disease: Secondary | ICD-10-CM | POA: Diagnosis not present

## 2017-01-05 DIAGNOSIS — J449 Chronic obstructive pulmonary disease, unspecified: Secondary | ICD-10-CM | POA: Diagnosis not present

## 2017-01-05 DIAGNOSIS — N186 End stage renal disease: Secondary | ICD-10-CM | POA: Diagnosis not present

## 2017-01-05 DIAGNOSIS — D649 Anemia, unspecified: Secondary | ICD-10-CM | POA: Diagnosis not present

## 2017-01-05 DIAGNOSIS — I5032 Chronic diastolic (congestive) heart failure: Secondary | ICD-10-CM | POA: Diagnosis not present

## 2017-01-05 DIAGNOSIS — D509 Iron deficiency anemia, unspecified: Secondary | ICD-10-CM | POA: Diagnosis not present

## 2017-01-05 DIAGNOSIS — I132 Hypertensive heart and chronic kidney disease with heart failure and with stage 5 chronic kidney disease, or end stage renal disease: Secondary | ICD-10-CM | POA: Diagnosis not present

## 2017-01-05 DIAGNOSIS — D689 Coagulation defect, unspecified: Secondary | ICD-10-CM | POA: Diagnosis not present

## 2017-01-06 ENCOUNTER — Emergency Department (HOSPITAL_COMMUNITY)
Admission: EM | Admit: 2017-01-06 | Discharge: 2017-01-06 | Disposition: A | Discharge status: Home / Self Care | Payer: Medicare Other | Attending: Emergency Medicine | Admitting: Emergency Medicine

## 2017-01-06 ENCOUNTER — Encounter (HOSPITAL_COMMUNITY): Payer: Self-pay

## 2017-01-06 DIAGNOSIS — Z992 Dependence on renal dialysis: Secondary | ICD-10-CM | POA: Insufficient documentation

## 2017-01-06 DIAGNOSIS — S40021A Contusion of right upper arm, initial encounter: Secondary | ICD-10-CM | POA: Insufficient documentation

## 2017-01-06 DIAGNOSIS — I132 Hypertensive heart and chronic kidney disease with heart failure and with stage 5 chronic kidney disease, or end stage renal disease: Secondary | ICD-10-CM | POA: Diagnosis not present

## 2017-01-06 DIAGNOSIS — E1122 Type 2 diabetes mellitus with diabetic chronic kidney disease: Secondary | ICD-10-CM | POA: Insufficient documentation

## 2017-01-06 DIAGNOSIS — I2510 Atherosclerotic heart disease of native coronary artery without angina pectoris: Secondary | ICD-10-CM | POA: Diagnosis not present

## 2017-01-06 DIAGNOSIS — Z7982 Long term (current) use of aspirin: Secondary | ICD-10-CM | POA: Diagnosis not present

## 2017-01-06 DIAGNOSIS — Z794 Long term (current) use of insulin: Secondary | ICD-10-CM | POA: Diagnosis not present

## 2017-01-06 DIAGNOSIS — Y999 Unspecified external cause status: Secondary | ICD-10-CM | POA: Diagnosis not present

## 2017-01-06 DIAGNOSIS — Y929 Unspecified place or not applicable: Secondary | ICD-10-CM | POA: Insufficient documentation

## 2017-01-06 DIAGNOSIS — W19XXXA Unspecified fall, initial encounter: Secondary | ICD-10-CM

## 2017-01-06 DIAGNOSIS — E039 Hypothyroidism, unspecified: Secondary | ICD-10-CM | POA: Diagnosis not present

## 2017-01-06 DIAGNOSIS — I5032 Chronic diastolic (congestive) heart failure: Secondary | ICD-10-CM | POA: Diagnosis not present

## 2017-01-06 DIAGNOSIS — Z87891 Personal history of nicotine dependence: Secondary | ICD-10-CM | POA: Diagnosis not present

## 2017-01-06 DIAGNOSIS — J449 Chronic obstructive pulmonary disease, unspecified: Secondary | ICD-10-CM | POA: Diagnosis not present

## 2017-01-06 DIAGNOSIS — I252 Old myocardial infarction: Secondary | ICD-10-CM | POA: Diagnosis not present

## 2017-01-06 DIAGNOSIS — W010XXA Fall on same level from slipping, tripping and stumbling without subsequent striking against object, initial encounter: Secondary | ICD-10-CM | POA: Diagnosis not present

## 2017-01-06 DIAGNOSIS — Z8673 Personal history of transient ischemic attack (TIA), and cerebral infarction without residual deficits: Secondary | ICD-10-CM | POA: Insufficient documentation

## 2017-01-06 DIAGNOSIS — Z79899 Other long term (current) drug therapy: Secondary | ICD-10-CM | POA: Diagnosis not present

## 2017-01-06 DIAGNOSIS — E1142 Type 2 diabetes mellitus with diabetic polyneuropathy: Secondary | ICD-10-CM | POA: Insufficient documentation

## 2017-01-06 DIAGNOSIS — M79601 Pain in right arm: Secondary | ICD-10-CM

## 2017-01-06 DIAGNOSIS — Y939 Activity, unspecified: Secondary | ICD-10-CM | POA: Insufficient documentation

## 2017-01-06 DIAGNOSIS — N186 End stage renal disease: Secondary | ICD-10-CM | POA: Insufficient documentation

## 2017-01-06 DIAGNOSIS — R531 Weakness: Secondary | ICD-10-CM | POA: Diagnosis not present

## 2017-01-06 DIAGNOSIS — S4991XA Unspecified injury of right shoulder and upper arm, initial encounter: Secondary | ICD-10-CM | POA: Diagnosis present

## 2017-01-06 DIAGNOSIS — T1490XA Injury, unspecified, initial encounter: Secondary | ICD-10-CM | POA: Diagnosis not present

## 2017-01-06 DIAGNOSIS — I6789 Other cerebrovascular disease: Secondary | ICD-10-CM | POA: Diagnosis not present

## 2017-01-06 NOTE — ED Triage Notes (Signed)
To room via EMS. s/p AV graft right upper arm 01-02-17, since then right hand, forearm has been numb.   Pt had hand on walker took several steps and felt self slipping to floor, hit right shoulder and right elbow on floor.  No c/o pain on hips/knees.  Pt denies dizziness, syncope.  A&O x 4

## 2017-01-06 NOTE — Discharge Instructions (Signed)
Use heat on the sore area of your right upper arm.  Be careful when ambulating, with your walker, and try to get help.  Return here, if needed, for problems.

## 2017-01-06 NOTE — ED Notes (Signed)
Pt and husband has concerns about how husband is going to get pt home.  Concerns with getting pt in and out of car d/t weakness.  Walker obtained, this nurse and nurse tech, assisted pt out of bed, pt pulled self up with walker, walked few steps, pivoted and sat self down in wheelchair.  Pt and husband report getting out of car is easier than getting in, reassured pt and husband this nurse and nurse tech will assist pt into vehicle.  MD updated with plan.

## 2017-01-06 NOTE — ED Provider Notes (Signed)
[facility]-EMERGENCY DEPT Provider Note   CSN: [id number] Arrival date & time: 01/06/17  1744     History   Chief Complaint Chief Complaint  Patient presents with  . Fall    HPI Kimberly [name] is a 61 y.o. female.   The patient presents for evaluation of falling.  She is concerned about a numb feeling in her she recently had arteriovenous graft placement in the right upper arm for dialysis access.  She describes the procedure is difficult requiring extensive intervention.  This was apparently done under local anesthesia.  Today she was walking using her walker, and felt that her hand slipped off the walker causing her to slump to the floor.  She does not feel like she injured anything.  She has a chronic gait disability requiring her to use a walker.  She is a wound on her left heel for which she is following with wound care.  She denies other prodrome.  She has been receiving her usual dialysis treatment, unscheduled.  There is been no fever, vomiting, cough, chest pain, focal weakness or paresthesia.  There are no other known modifying factors.    HPI  Past Medical History:  Diagnosis Date  . Anemia   . Anxiety   . Arthritis    "knees, feet, hands" (08/23/2016)  . Asthma   . CAD, NATIVE VESSEL    May 10, 2010 cath showed a hyperdynamic LV function, she had dominant circumflex anatomy with a 70-80% small OM1. She had diffuse diabetic plaque particularly in the distal LAD. She nondominant RCA.  Nondominant  . Cellulitis 10/15/2013  . CHF (congestive heart failure) (HCC)    Preserved EF  . Chronic bronchitis (HCC)    "probably once/ yr" (08/23/2016)  . Complication of anesthesia    "I've had difficulty waking up" (08/23/2016)  . COPD   . Depression   . Diabetic neuropathy (HCC)   . Dyspnea   . Endometrial hyperplasia   . ESRD (end stage renal disease) on dialysis (HCC)    "[facility]; MWF; [facility]" (08/23/2016)  . Family history of adverse reaction to anesthesia    mother  had hard time waking up  . GERD (gastroesophageal reflux disease)   . History of hiatal hernia   . HYPERLIPIDEMIA   . HYPERTENSION   . Hypothyroidism   . Myocardial infarct, old   . On home oxygen therapy    "2L; 24/7" (08/23/2016)  . Paralyzed vocal cords   . Peripheral neuropathy    hx/notes 01/27/2010  . Pneumonia "several times"  . Proliferative retinopathy    hx/notes 01/27/2010  . PVD    CEA  . Restless leg syndrome    "mostly on the right" (08/23/2016)  . 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 Type II diabetes mellitus [facility])     Patient Active Problem List   Diagnosis Date Noted  . Diabetes mellitus, type II, insulin dependent (HCC)   . Pressure injury of skin 08/29/2016  . Cellulitis of leg 08/29/2016  . Chest pain 08/28/2016  . Elevated troponin 08/28/2016  . 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 Hyperkalemia 08/23/2016  . Shortness of breath 08/23/2016  . Hyponatremia 08/23/2016  . Hypotension arterial 09/30/2015  . LBBB (left bundle branch block) 09/30/2015  . Complex endometrial hyperplasia with atypia 09/02/2015  . Severe obesity (HCC) 07/29/2015  . Hypotension 07/20/2015  .  Uncontrolled type 2 diabetes mellitus (HCC) 07/20/2015  . Vaginal bleeding   . Altered mental status   . Pulmonary arterial hypertension (HCC)   . Paroxysmal atrial fibrillation (HCC) 07/11/2015  . ESRD (end stage renal disease) (HCC) 07/08/2015  . Diabetic ulcer of right foot associated with diabetes mellitus due to underlying condition (HCC)   . Pressure ulcer 04/07/2015  . Obesity hypoventilation syndrome (HCC) 08/05/2014  . Morbid obesity (HCC) 06/28/2014  . Physical deconditioning 06/28/2014  . Hypothyroidism 06/10/2014  . Carotid stenosis 12/02/2013  . Varicose veins of lower extremities with other complications 12/02/2013  . Chronic respiratory failure with hypoxia  (HCC) 10/13/2013  . On home oxygen therapy 10/13/2013  . Chronic diastolic CHF (congestive heart failure) (HCC) 09/12/2013  . CAD (coronary artery disease), native coronary artery   . DM (diabetes mellitus), type 2 with renal complications (HCC) 05/31/2010  . Hyperlipidemia associated with type 2 diabetes mellitus (HCC) 05/31/2010  . COPD (chronic obstructive pulmonary disease) (HCC) 05/31/2010  . Hypertensive heart disease     Past Surgical History:  Procedure Laterality Date  . AV FISTULA PLACEMENT Right 07/08/2015   Procedure: EXPLORATION RIGHT AXILLARY ARTERY AND RIGHT BRACHIAL VEIN;  Surgeon: Chuck Hint, MD;  Location: [facility] OR;  Service: Vascular;  Laterality: Right;  . AV FISTULA PLACEMENT Right 01/02/2017   Procedure: INSERTION OF ARTERIOVENOUS GORE-TEX  GRAFT  RIGHT ARM;  Surgeon: Chuck Hint, MD;  Location: [facility] OR;  Service: Vascular;  Laterality: Right;  . CAROTID ENDARTERECTOMY Left X 2  . CATARACT EXTRACTION W/ INTRAOCULAR LENS  IMPLANT, BILATERAL Bilateral   . CERVICAL BIOPSY  ~ 2015   "precancerous cells"  . COLONOSCOPY    . EYE SURGERY Bilateral    numerous surgeries  . INSERTION OF DIALYSIS CATHETER N/A 06/24/2015   Procedure: ULTRASOUND BILATERAL INTERNAL JUGULAR VEIN INSERTION OF DIALYSIS CATHETER LEFT INTERNAL JUGULAR VEIN ;  Surgeon: Pryor Ochoa, MD;  Location: [facility] OR;  Service: Vascular;  Laterality: N/A;  . INTRAUTERINE DEVICE INSERTION  ~ 2015  . VITRECTOMY Bilateral     OB History    No data available       Home Medications    Prior to Admission medications   Medication Sig Start Date End Date Taking? Authorizing Provider  Acetaminophen (TYLENOL PO) Take 1 each by mouth daily as needed (pain). Liquid tylenol -takes a capful    Historical Provider, MD  albuterol (PROVENTIL HFA;VENTOLIN HFA) 108 (90 Base) MCG/ACT inhaler Inhale 2 puffs into the lungs every 6 (six) hours as needed for wheezing or shortness of breath.    Historical  Provider, MD  aspirin EC 81 MG tablet Take 81 mg by mouth daily.    Historical Provider, MD  docusate sodium (COLACE) 100 MG capsule Take 1 capsule (100 mg total) by mouth daily. 10/10/16   Dinah C Ngetich, NP  guaiFENesin-dextromethorphan (ROBITUSSIN DM) 100-10 MG/5ML syrup Take 10 mLs by mouth every 6 (six) hours as needed for cough.    Historical Provider, MD  hydrocerin (EUCERIN) CREA Apply 1 application topically 2 (two) times daily. 07/20/15   Nishant Dhungel, MD  insulin glargine (LANTUS) 100 UNIT/ML injection Inject 0.4 mLs (40 Units total) into the skin 2 (two) times daily. 08/31/16   Dorothea Ogle, MD  insulin lispro (HUMALOG) 100 UNIT/ML injection Inject 0-30 Units into the skin 3 (three) times daily before meals. Sliding Scale (also depends on what patient is eating) 100-150 = 10 units 151-200=10-15 units 201-299=20 units >300 =30  units    Historical Provider, MD  levonorgestrel (MIRENA) 20 MCG/24HR IUD 1 Intra Uterine Device (1 each total) by Intrauterine route once. 08/16/15   Adolphus Birchwood, MD  levothyroxine (SYNTHROID, LEVOTHROID) 88 MCG tablet Take 1 tablet (88 mcg total) by mouth daily before breakfast. 10/10/16   Dinah C Ngetich, NP  midodrine (PROAMATINE) 10 MG tablet Take 1 tablet (10 mg total) by mouth 2 (two) times daily with a meal. Patient taking differently: Take 10 mg by mouth 3 (three) times a week. Takes 30 minutes prior to dialysis on Monday, Wednesday and Friday 07/20/15   Nishant Dhungel, MD  Multiple Vitamins-Minerals (MULTIVITAMIN ADULT) CHEW Chew 1 tablet by mouth daily.    Historical Provider, MD  oxyCODONE-acetaminophen (ROXICET) 5-325 MG tablet Take 1-2 tablets by mouth every 4 (four) hours as needed. 01/02/17   Chuck Hint, MD  OXYGEN 2lpm 24/7- [facility]    Historical Provider, MD  senna (SENOKOT) 8.6 MG tablet Take 2 tablets by mouth at bedtime as needed for constipation.     Historical Provider, MD  sevelamer carbonate (RENVELA) 2.4 g PACK Take 2.4 g by mouth 3  (three) times daily with meals. 08/31/16   Dorothea Ogle, MD  sodium chloride (OCEAN) 0.65 % SOLN nasal spray Place 1 spray into both nostrils as needed for congestion.    Historical Provider, MD    Family History Family History  Problem Relation Age of Onset  . Cancer Mother     Breast, NHL  . Stroke Mother   . Peripheral vascular disease Father   . CAD Father 67  . Heart attack Father   . Hypertension Father   . Asthma Father   . Heart disease Father     before age 40    Social History Social History  Substance Use Topics  . Smoking status: Former Smoker    Packs/day: 1.00    Years: 20.00    Types: Cigarettes    Quit date: 09/11/2005  . Smokeless tobacco: Never Used  . Alcohol use No     Allergies   Ciprofloxacin and Epinephrine   Review of Systems Review of Systems  All other systems reviewed and are negative.    Physical Exam Updated Vital Signs There were no vitals taken for this visit.  Physical Exam  Constitutional: She is oriented to person, place, and time. She appears well-developed.  Appears older than stated age.  Morbidly obese.  HENT:  Head: Normocephalic and atraumatic.  Eyes: Conjunctivae and EOM are normal. Pupils are equal, round, and reactive to light.  Neck: Normal range of motion and phonation normal. Neck supple.  Cardiovascular: Normal rate and regular rhythm.   Pulmonary/Chest: Effort normal and breath sounds normal. She exhibits no tenderness.  Musculoskeletal: Normal range of motion.  Moderate swelling with ecchymosis right upper arm, with healing surgical wounds, without associated drainage, bleeding, or focal areas of tenderness.  There is no wound dehiscence.  Distally in the right forearm and hand she is sensate, with decreased light touch sensation.  There is a normal palpable pulse over her palpable vascular graft in her right upper arm.  Capillary refill in the right hand is normal.  Neurological: She is alert and oriented to  person, place, and time. She exhibits normal muscle tone.  Skin: Skin is warm and dry.  Psychiatric: She has a normal mood and affect. Her behavior is normal. Judgment and thought content normal.  Nursing note and vitals reviewed.    ED Treatments /  Results  Labs (all labs ordered are listed, but only abnormal results are displayed) Labs Reviewed - No data to display  EKG  EKG Interpretation None       Radiology No results found.  Procedures Procedures (including critical care time)  Medications Ordered in ED Medications - No data to display   Initial Impression / Assessment and Plan / ED Course  I have reviewed the triage vital signs and the nursing notes.  Pertinent labs & imaging results that were available during my care of the patient were reviewed by me and considered in my medical decision making (see chart for details).     Medications - No data to display  No data found.   At discharge- reevaluation with update and discussion. After initial assessment and treatment, an updated evaluation reveals the patient remains comfortable, and has been able to ambulate, with assistance.  Findings discussed with patient and husband, all questions answered. Sion Reinders L    Final Clinical Impressions(s) / ED Diagnoses   Final diagnoses:  Fall, initial encounter  Right arm pain    Right arm discomfort, postoperative, with expected findings, including some mild dysesthesia.  Patient's arm slipped off her walker while ambulating causing her to slump to the floor.  There were no serious injuries.  Patient improved and able to ambulate with assistance per her norm.  Nursing Notes Reviewed/ Care Coordinated Applicable Imaging Reviewed Interpretation of Laboratory Data incorporated into ED treatment  The patient appears reasonably screened and/or stabilized for discharge and I doubt any other medical condition or other [facility] requiring further screening, evaluation, or  treatment in the ED at this time prior to discharge.  Plan: Home Medications-continue usual medications; Home Treatments-rest, fluids, follow-up for dialysis as scheduled; return here if the recommended treatment, does not improve the symptoms; Recommended follow up-PCP and renal physician as needed.   New Prescriptions Discharge Medication List as of 01/06/2017  7:23 PM       Mancel Bale, MD 01/07/17 1213

## 2017-01-08 DIAGNOSIS — D631 Anemia in chronic kidney disease: Secondary | ICD-10-CM | POA: Diagnosis not present

## 2017-01-08 DIAGNOSIS — I129 Hypertensive chronic kidney disease with stage 1 through stage 4 chronic kidney disease, or unspecified chronic kidney disease: Secondary | ICD-10-CM | POA: Diagnosis not present

## 2017-01-08 DIAGNOSIS — D689 Coagulation defect, unspecified: Secondary | ICD-10-CM | POA: Diagnosis not present

## 2017-01-08 DIAGNOSIS — N186 End stage renal disease: Secondary | ICD-10-CM | POA: Diagnosis not present

## 2017-01-08 DIAGNOSIS — Z283 Underimmunization status: Secondary | ICD-10-CM | POA: Diagnosis not present

## 2017-01-08 DIAGNOSIS — Z992 Dependence on renal dialysis: Secondary | ICD-10-CM | POA: Diagnosis not present

## 2017-01-08 DIAGNOSIS — D509 Iron deficiency anemia, unspecified: Secondary | ICD-10-CM | POA: Diagnosis not present

## 2017-01-08 DIAGNOSIS — D649 Anemia, unspecified: Secondary | ICD-10-CM | POA: Diagnosis not present

## 2017-01-09 DIAGNOSIS — J449 Chronic obstructive pulmonary disease, unspecified: Secondary | ICD-10-CM | POA: Diagnosis not present

## 2017-01-09 DIAGNOSIS — E1122 Type 2 diabetes mellitus with diabetic chronic kidney disease: Secondary | ICD-10-CM | POA: Diagnosis not present

## 2017-01-09 DIAGNOSIS — N186 End stage renal disease: Secondary | ICD-10-CM | POA: Diagnosis not present

## 2017-01-09 DIAGNOSIS — I132 Hypertensive heart and chronic kidney disease with heart failure and with stage 5 chronic kidney disease, or end stage renal disease: Secondary | ICD-10-CM | POA: Diagnosis not present

## 2017-01-09 DIAGNOSIS — L89620 Pressure ulcer of left heel, unstageable: Secondary | ICD-10-CM | POA: Diagnosis not present

## 2017-01-09 DIAGNOSIS — I5032 Chronic diastolic (congestive) heart failure: Secondary | ICD-10-CM | POA: Diagnosis not present

## 2017-01-10 DIAGNOSIS — Z283 Underimmunization status: Secondary | ICD-10-CM | POA: Diagnosis not present

## 2017-01-10 DIAGNOSIS — D631 Anemia in chronic kidney disease: Secondary | ICD-10-CM | POA: Diagnosis not present

## 2017-01-10 DIAGNOSIS — N186 End stage renal disease: Secondary | ICD-10-CM | POA: Diagnosis not present

## 2017-01-10 DIAGNOSIS — E119 Type 2 diabetes mellitus without complications: Secondary | ICD-10-CM | POA: Diagnosis not present

## 2017-01-10 DIAGNOSIS — Z23 Encounter for immunization: Secondary | ICD-10-CM | POA: Diagnosis not present

## 2017-01-10 DIAGNOSIS — D509 Iron deficiency anemia, unspecified: Secondary | ICD-10-CM | POA: Diagnosis not present

## 2017-01-10 DIAGNOSIS — N2581 Secondary hyperparathyroidism of renal origin: Secondary | ICD-10-CM | POA: Diagnosis not present

## 2017-01-11 ENCOUNTER — Encounter (HOSPITAL_BASED_OUTPATIENT_CLINIC_OR_DEPARTMENT_OTHER): Payer: Medicare Other | Attending: Internal Medicine

## 2017-01-11 DIAGNOSIS — I252 Old myocardial infarction: Secondary | ICD-10-CM | POA: Diagnosis not present

## 2017-01-11 DIAGNOSIS — N186 End stage renal disease: Secondary | ICD-10-CM | POA: Insufficient documentation

## 2017-01-11 DIAGNOSIS — Z9981 Dependence on supplemental oxygen: Secondary | ICD-10-CM | POA: Diagnosis not present

## 2017-01-11 DIAGNOSIS — D649 Anemia, unspecified: Secondary | ICD-10-CM | POA: Insufficient documentation

## 2017-01-11 DIAGNOSIS — E11621 Type 2 diabetes mellitus with foot ulcer: Secondary | ICD-10-CM | POA: Insufficient documentation

## 2017-01-11 DIAGNOSIS — L89620 Pressure ulcer of left heel, unstageable: Secondary | ICD-10-CM | POA: Diagnosis not present

## 2017-01-11 DIAGNOSIS — E1140 Type 2 diabetes mellitus with diabetic neuropathy, unspecified: Secondary | ICD-10-CM | POA: Diagnosis not present

## 2017-01-11 DIAGNOSIS — M1990 Unspecified osteoarthritis, unspecified site: Secondary | ICD-10-CM | POA: Insufficient documentation

## 2017-01-11 DIAGNOSIS — I509 Heart failure, unspecified: Secondary | ICD-10-CM | POA: Diagnosis not present

## 2017-01-11 DIAGNOSIS — J449 Chronic obstructive pulmonary disease, unspecified: Secondary | ICD-10-CM | POA: Insufficient documentation

## 2017-01-11 DIAGNOSIS — I132 Hypertensive heart and chronic kidney disease with heart failure and with stage 5 chronic kidney disease, or end stage renal disease: Secondary | ICD-10-CM | POA: Diagnosis not present

## 2017-01-11 DIAGNOSIS — I70202 Unspecified atherosclerosis of native arteries of extremities, left leg: Secondary | ICD-10-CM | POA: Insufficient documentation

## 2017-01-11 DIAGNOSIS — Z992 Dependence on renal dialysis: Secondary | ICD-10-CM | POA: Insufficient documentation

## 2017-01-11 DIAGNOSIS — I2510 Atherosclerotic heart disease of native coronary artery without angina pectoris: Secondary | ICD-10-CM | POA: Insufficient documentation

## 2017-01-11 DIAGNOSIS — F411 Generalized anxiety disorder: Secondary | ICD-10-CM | POA: Insufficient documentation

## 2017-01-11 DIAGNOSIS — L97422 Non-pressure chronic ulcer of left heel and midfoot with fat layer exposed: Secondary | ICD-10-CM | POA: Diagnosis not present

## 2017-01-11 DIAGNOSIS — E1122 Type 2 diabetes mellitus with diabetic chronic kidney disease: Secondary | ICD-10-CM | POA: Diagnosis not present

## 2017-01-12 DIAGNOSIS — I132 Hypertensive heart and chronic kidney disease with heart failure and with stage 5 chronic kidney disease, or end stage renal disease: Secondary | ICD-10-CM | POA: Diagnosis not present

## 2017-01-12 DIAGNOSIS — E119 Type 2 diabetes mellitus without complications: Secondary | ICD-10-CM | POA: Diagnosis not present

## 2017-01-12 DIAGNOSIS — Z283 Underimmunization status: Secondary | ICD-10-CM | POA: Diagnosis not present

## 2017-01-12 DIAGNOSIS — J449 Chronic obstructive pulmonary disease, unspecified: Secondary | ICD-10-CM | POA: Diagnosis not present

## 2017-01-12 DIAGNOSIS — N186 End stage renal disease: Secondary | ICD-10-CM | POA: Diagnosis not present

## 2017-01-12 DIAGNOSIS — D631 Anemia in chronic kidney disease: Secondary | ICD-10-CM | POA: Diagnosis not present

## 2017-01-12 DIAGNOSIS — N2581 Secondary hyperparathyroidism of renal origin: Secondary | ICD-10-CM | POA: Diagnosis not present

## 2017-01-12 DIAGNOSIS — I5032 Chronic diastolic (congestive) heart failure: Secondary | ICD-10-CM | POA: Diagnosis not present

## 2017-01-12 DIAGNOSIS — E1122 Type 2 diabetes mellitus with diabetic chronic kidney disease: Secondary | ICD-10-CM | POA: Diagnosis not present

## 2017-01-12 DIAGNOSIS — D509 Iron deficiency anemia, unspecified: Secondary | ICD-10-CM | POA: Diagnosis not present

## 2017-01-12 DIAGNOSIS — L89620 Pressure ulcer of left heel, unstageable: Secondary | ICD-10-CM | POA: Diagnosis not present

## 2017-01-15 DIAGNOSIS — Z283 Underimmunization status: Secondary | ICD-10-CM | POA: Diagnosis not present

## 2017-01-15 DIAGNOSIS — N186 End stage renal disease: Secondary | ICD-10-CM | POA: Diagnosis not present

## 2017-01-15 DIAGNOSIS — D631 Anemia in chronic kidney disease: Secondary | ICD-10-CM | POA: Diagnosis not present

## 2017-01-15 DIAGNOSIS — N2581 Secondary hyperparathyroidism of renal origin: Secondary | ICD-10-CM | POA: Diagnosis not present

## 2017-01-15 DIAGNOSIS — E119 Type 2 diabetes mellitus without complications: Secondary | ICD-10-CM | POA: Diagnosis not present

## 2017-01-15 DIAGNOSIS — D509 Iron deficiency anemia, unspecified: Secondary | ICD-10-CM | POA: Diagnosis not present

## 2017-01-16 DIAGNOSIS — E1122 Type 2 diabetes mellitus with diabetic chronic kidney disease: Secondary | ICD-10-CM | POA: Diagnosis not present

## 2017-01-16 DIAGNOSIS — I5032 Chronic diastolic (congestive) heart failure: Secondary | ICD-10-CM | POA: Diagnosis not present

## 2017-01-16 DIAGNOSIS — L89620 Pressure ulcer of left heel, unstageable: Secondary | ICD-10-CM | POA: Diagnosis not present

## 2017-01-16 DIAGNOSIS — N186 End stage renal disease: Secondary | ICD-10-CM | POA: Diagnosis not present

## 2017-01-16 DIAGNOSIS — J449 Chronic obstructive pulmonary disease, unspecified: Secondary | ICD-10-CM | POA: Diagnosis not present

## 2017-01-16 DIAGNOSIS — I132 Hypertensive heart and chronic kidney disease with heart failure and with stage 5 chronic kidney disease, or end stage renal disease: Secondary | ICD-10-CM | POA: Diagnosis not present

## 2017-01-17 DIAGNOSIS — Z283 Underimmunization status: Secondary | ICD-10-CM | POA: Diagnosis not present

## 2017-01-17 DIAGNOSIS — N186 End stage renal disease: Secondary | ICD-10-CM | POA: Diagnosis not present

## 2017-01-17 DIAGNOSIS — D509 Iron deficiency anemia, unspecified: Secondary | ICD-10-CM | POA: Diagnosis not present

## 2017-01-17 DIAGNOSIS — N2581 Secondary hyperparathyroidism of renal origin: Secondary | ICD-10-CM | POA: Diagnosis not present

## 2017-01-17 DIAGNOSIS — D631 Anemia in chronic kidney disease: Secondary | ICD-10-CM | POA: Diagnosis not present

## 2017-01-17 DIAGNOSIS — E119 Type 2 diabetes mellitus without complications: Secondary | ICD-10-CM | POA: Diagnosis not present

## 2017-01-18 DIAGNOSIS — D649 Anemia, unspecified: Secondary | ICD-10-CM | POA: Diagnosis not present

## 2017-01-18 DIAGNOSIS — N186 End stage renal disease: Secondary | ICD-10-CM | POA: Diagnosis not present

## 2017-01-18 DIAGNOSIS — E1122 Type 2 diabetes mellitus with diabetic chronic kidney disease: Secondary | ICD-10-CM | POA: Diagnosis not present

## 2017-01-18 DIAGNOSIS — L89620 Pressure ulcer of left heel, unstageable: Secondary | ICD-10-CM | POA: Diagnosis not present

## 2017-01-18 DIAGNOSIS — E11621 Type 2 diabetes mellitus with foot ulcer: Secondary | ICD-10-CM | POA: Diagnosis not present

## 2017-01-18 DIAGNOSIS — I70202 Unspecified atherosclerosis of native arteries of extremities, left leg: Secondary | ICD-10-CM | POA: Diagnosis not present

## 2017-01-18 DIAGNOSIS — L97422 Non-pressure chronic ulcer of left heel and midfoot with fat layer exposed: Secondary | ICD-10-CM | POA: Diagnosis not present

## 2017-01-19 DIAGNOSIS — Z283 Underimmunization status: Secondary | ICD-10-CM | POA: Diagnosis not present

## 2017-01-19 DIAGNOSIS — E1122 Type 2 diabetes mellitus with diabetic chronic kidney disease: Secondary | ICD-10-CM | POA: Diagnosis not present

## 2017-01-19 DIAGNOSIS — N186 End stage renal disease: Secondary | ICD-10-CM | POA: Diagnosis not present

## 2017-01-19 DIAGNOSIS — I5032 Chronic diastolic (congestive) heart failure: Secondary | ICD-10-CM | POA: Diagnosis not present

## 2017-01-19 DIAGNOSIS — E119 Type 2 diabetes mellitus without complications: Secondary | ICD-10-CM | POA: Diagnosis not present

## 2017-01-19 DIAGNOSIS — D509 Iron deficiency anemia, unspecified: Secondary | ICD-10-CM | POA: Diagnosis not present

## 2017-01-19 DIAGNOSIS — J449 Chronic obstructive pulmonary disease, unspecified: Secondary | ICD-10-CM | POA: Diagnosis not present

## 2017-01-19 DIAGNOSIS — D631 Anemia in chronic kidney disease: Secondary | ICD-10-CM | POA: Diagnosis not present

## 2017-01-19 DIAGNOSIS — N2581 Secondary hyperparathyroidism of renal origin: Secondary | ICD-10-CM | POA: Diagnosis not present

## 2017-01-19 DIAGNOSIS — L89620 Pressure ulcer of left heel, unstageable: Secondary | ICD-10-CM | POA: Diagnosis not present

## 2017-01-19 DIAGNOSIS — I132 Hypertensive heart and chronic kidney disease with heart failure and with stage 5 chronic kidney disease, or end stage renal disease: Secondary | ICD-10-CM | POA: Diagnosis not present

## 2017-01-22 DIAGNOSIS — D631 Anemia in chronic kidney disease: Secondary | ICD-10-CM | POA: Diagnosis not present

## 2017-01-22 DIAGNOSIS — N186 End stage renal disease: Secondary | ICD-10-CM | POA: Diagnosis not present

## 2017-01-22 DIAGNOSIS — N2581 Secondary hyperparathyroidism of renal origin: Secondary | ICD-10-CM | POA: Diagnosis not present

## 2017-01-22 DIAGNOSIS — Z283 Underimmunization status: Secondary | ICD-10-CM | POA: Diagnosis not present

## 2017-01-22 DIAGNOSIS — E119 Type 2 diabetes mellitus without complications: Secondary | ICD-10-CM | POA: Diagnosis not present

## 2017-01-22 DIAGNOSIS — D509 Iron deficiency anemia, unspecified: Secondary | ICD-10-CM | POA: Diagnosis not present

## 2017-01-23 DIAGNOSIS — N186 End stage renal disease: Secondary | ICD-10-CM | POA: Diagnosis not present

## 2017-01-23 DIAGNOSIS — L89620 Pressure ulcer of left heel, unstageable: Secondary | ICD-10-CM | POA: Diagnosis not present

## 2017-01-23 DIAGNOSIS — I5032 Chronic diastolic (congestive) heart failure: Secondary | ICD-10-CM | POA: Diagnosis not present

## 2017-01-23 DIAGNOSIS — E1122 Type 2 diabetes mellitus with diabetic chronic kidney disease: Secondary | ICD-10-CM | POA: Diagnosis not present

## 2017-01-23 DIAGNOSIS — J449 Chronic obstructive pulmonary disease, unspecified: Secondary | ICD-10-CM | POA: Diagnosis not present

## 2017-01-23 DIAGNOSIS — I132 Hypertensive heart and chronic kidney disease with heart failure and with stage 5 chronic kidney disease, or end stage renal disease: Secondary | ICD-10-CM | POA: Diagnosis not present

## 2017-01-24 ENCOUNTER — Encounter: Payer: Medicare Other | Admitting: Vascular Surgery

## 2017-01-24 DIAGNOSIS — D509 Iron deficiency anemia, unspecified: Secondary | ICD-10-CM | POA: Diagnosis not present

## 2017-01-24 DIAGNOSIS — E119 Type 2 diabetes mellitus without complications: Secondary | ICD-10-CM | POA: Diagnosis not present

## 2017-01-24 DIAGNOSIS — N2581 Secondary hyperparathyroidism of renal origin: Secondary | ICD-10-CM | POA: Diagnosis not present

## 2017-01-24 DIAGNOSIS — Z283 Underimmunization status: Secondary | ICD-10-CM | POA: Diagnosis not present

## 2017-01-24 DIAGNOSIS — N186 End stage renal disease: Secondary | ICD-10-CM | POA: Diagnosis not present

## 2017-01-24 DIAGNOSIS — D631 Anemia in chronic kidney disease: Secondary | ICD-10-CM | POA: Diagnosis not present

## 2017-01-25 DIAGNOSIS — L97422 Non-pressure chronic ulcer of left heel and midfoot with fat layer exposed: Secondary | ICD-10-CM | POA: Diagnosis not present

## 2017-01-25 DIAGNOSIS — L89620 Pressure ulcer of left heel, unstageable: Secondary | ICD-10-CM | POA: Diagnosis not present

## 2017-01-25 DIAGNOSIS — D649 Anemia, unspecified: Secondary | ICD-10-CM | POA: Diagnosis not present

## 2017-01-25 DIAGNOSIS — E11621 Type 2 diabetes mellitus with foot ulcer: Secondary | ICD-10-CM | POA: Diagnosis not present

## 2017-01-25 DIAGNOSIS — I70202 Unspecified atherosclerosis of native arteries of extremities, left leg: Secondary | ICD-10-CM | POA: Diagnosis not present

## 2017-01-25 DIAGNOSIS — N186 End stage renal disease: Secondary | ICD-10-CM | POA: Diagnosis not present

## 2017-01-25 DIAGNOSIS — E1122 Type 2 diabetes mellitus with diabetic chronic kidney disease: Secondary | ICD-10-CM | POA: Diagnosis not present

## 2017-01-26 ENCOUNTER — Encounter: Payer: Self-pay | Admitting: Vascular Surgery

## 2017-01-26 DIAGNOSIS — J449 Chronic obstructive pulmonary disease, unspecified: Secondary | ICD-10-CM | POA: Diagnosis not present

## 2017-01-26 DIAGNOSIS — E1122 Type 2 diabetes mellitus with diabetic chronic kidney disease: Secondary | ICD-10-CM | POA: Diagnosis not present

## 2017-01-26 DIAGNOSIS — Z283 Underimmunization status: Secondary | ICD-10-CM | POA: Diagnosis not present

## 2017-01-26 DIAGNOSIS — L89620 Pressure ulcer of left heel, unstageable: Secondary | ICD-10-CM | POA: Diagnosis not present

## 2017-01-26 DIAGNOSIS — N2581 Secondary hyperparathyroidism of renal origin: Secondary | ICD-10-CM | POA: Diagnosis not present

## 2017-01-26 DIAGNOSIS — I132 Hypertensive heart and chronic kidney disease with heart failure and with stage 5 chronic kidney disease, or end stage renal disease: Secondary | ICD-10-CM | POA: Diagnosis not present

## 2017-01-26 DIAGNOSIS — N186 End stage renal disease: Secondary | ICD-10-CM | POA: Diagnosis not present

## 2017-01-26 DIAGNOSIS — I5032 Chronic diastolic (congestive) heart failure: Secondary | ICD-10-CM | POA: Diagnosis not present

## 2017-01-26 DIAGNOSIS — E119 Type 2 diabetes mellitus without complications: Secondary | ICD-10-CM | POA: Diagnosis not present

## 2017-01-26 DIAGNOSIS — D509 Iron deficiency anemia, unspecified: Secondary | ICD-10-CM | POA: Diagnosis not present

## 2017-01-26 DIAGNOSIS — D631 Anemia in chronic kidney disease: Secondary | ICD-10-CM | POA: Diagnosis not present

## 2017-01-27 DIAGNOSIS — J449 Chronic obstructive pulmonary disease, unspecified: Secondary | ICD-10-CM | POA: Diagnosis not present

## 2017-01-27 DIAGNOSIS — E1122 Type 2 diabetes mellitus with diabetic chronic kidney disease: Secondary | ICD-10-CM | POA: Diagnosis not present

## 2017-01-27 DIAGNOSIS — N186 End stage renal disease: Secondary | ICD-10-CM | POA: Diagnosis not present

## 2017-01-27 DIAGNOSIS — I132 Hypertensive heart and chronic kidney disease with heart failure and with stage 5 chronic kidney disease, or end stage renal disease: Secondary | ICD-10-CM | POA: Diagnosis not present

## 2017-01-27 DIAGNOSIS — L89620 Pressure ulcer of left heel, unstageable: Secondary | ICD-10-CM | POA: Diagnosis not present

## 2017-01-27 DIAGNOSIS — I5032 Chronic diastolic (congestive) heart failure: Secondary | ICD-10-CM | POA: Diagnosis not present

## 2017-01-29 DIAGNOSIS — D631 Anemia in chronic kidney disease: Secondary | ICD-10-CM | POA: Diagnosis not present

## 2017-01-29 DIAGNOSIS — E119 Type 2 diabetes mellitus without complications: Secondary | ICD-10-CM | POA: Diagnosis not present

## 2017-01-29 DIAGNOSIS — D509 Iron deficiency anemia, unspecified: Secondary | ICD-10-CM | POA: Diagnosis not present

## 2017-01-29 DIAGNOSIS — Z283 Underimmunization status: Secondary | ICD-10-CM | POA: Diagnosis not present

## 2017-01-29 DIAGNOSIS — N2581 Secondary hyperparathyroidism of renal origin: Secondary | ICD-10-CM | POA: Diagnosis not present

## 2017-01-29 DIAGNOSIS — N186 End stage renal disease: Secondary | ICD-10-CM | POA: Diagnosis not present

## 2017-01-30 DIAGNOSIS — E1122 Type 2 diabetes mellitus with diabetic chronic kidney disease: Secondary | ICD-10-CM | POA: Diagnosis not present

## 2017-01-30 DIAGNOSIS — I132 Hypertensive heart and chronic kidney disease with heart failure and with stage 5 chronic kidney disease, or end stage renal disease: Secondary | ICD-10-CM | POA: Diagnosis not present

## 2017-01-30 DIAGNOSIS — L89620 Pressure ulcer of left heel, unstageable: Secondary | ICD-10-CM | POA: Diagnosis not present

## 2017-01-30 DIAGNOSIS — I5032 Chronic diastolic (congestive) heart failure: Secondary | ICD-10-CM | POA: Diagnosis not present

## 2017-01-30 DIAGNOSIS — N186 End stage renal disease: Secondary | ICD-10-CM | POA: Diagnosis not present

## 2017-01-30 DIAGNOSIS — J449 Chronic obstructive pulmonary disease, unspecified: Secondary | ICD-10-CM | POA: Diagnosis not present

## 2017-01-31 DIAGNOSIS — E119 Type 2 diabetes mellitus without complications: Secondary | ICD-10-CM | POA: Diagnosis not present

## 2017-01-31 DIAGNOSIS — N186 End stage renal disease: Secondary | ICD-10-CM | POA: Diagnosis not present

## 2017-01-31 DIAGNOSIS — D631 Anemia in chronic kidney disease: Secondary | ICD-10-CM | POA: Diagnosis not present

## 2017-01-31 DIAGNOSIS — N2581 Secondary hyperparathyroidism of renal origin: Secondary | ICD-10-CM | POA: Diagnosis not present

## 2017-01-31 DIAGNOSIS — D509 Iron deficiency anemia, unspecified: Secondary | ICD-10-CM | POA: Diagnosis not present

## 2017-01-31 DIAGNOSIS — Z283 Underimmunization status: Secondary | ICD-10-CM | POA: Diagnosis not present

## 2017-02-01 DIAGNOSIS — N186 End stage renal disease: Secondary | ICD-10-CM | POA: Diagnosis not present

## 2017-02-01 DIAGNOSIS — L89620 Pressure ulcer of left heel, unstageable: Secondary | ICD-10-CM | POA: Diagnosis not present

## 2017-02-01 DIAGNOSIS — L97422 Non-pressure chronic ulcer of left heel and midfoot with fat layer exposed: Secondary | ICD-10-CM | POA: Diagnosis not present

## 2017-02-01 DIAGNOSIS — E1122 Type 2 diabetes mellitus with diabetic chronic kidney disease: Secondary | ICD-10-CM | POA: Diagnosis not present

## 2017-02-01 DIAGNOSIS — E11621 Type 2 diabetes mellitus with foot ulcer: Secondary | ICD-10-CM | POA: Diagnosis not present

## 2017-02-01 DIAGNOSIS — I70202 Unspecified atherosclerosis of native arteries of extremities, left leg: Secondary | ICD-10-CM | POA: Diagnosis not present

## 2017-02-01 DIAGNOSIS — D649 Anemia, unspecified: Secondary | ICD-10-CM | POA: Diagnosis not present

## 2017-02-02 DIAGNOSIS — Z283 Underimmunization status: Secondary | ICD-10-CM | POA: Diagnosis not present

## 2017-02-02 DIAGNOSIS — D631 Anemia in chronic kidney disease: Secondary | ICD-10-CM | POA: Diagnosis not present

## 2017-02-02 DIAGNOSIS — E119 Type 2 diabetes mellitus without complications: Secondary | ICD-10-CM | POA: Diagnosis not present

## 2017-02-02 DIAGNOSIS — N186 End stage renal disease: Secondary | ICD-10-CM | POA: Diagnosis not present

## 2017-02-02 DIAGNOSIS — N2581 Secondary hyperparathyroidism of renal origin: Secondary | ICD-10-CM | POA: Diagnosis not present

## 2017-02-02 DIAGNOSIS — D509 Iron deficiency anemia, unspecified: Secondary | ICD-10-CM | POA: Diagnosis not present

## 2017-02-03 DIAGNOSIS — J449 Chronic obstructive pulmonary disease, unspecified: Secondary | ICD-10-CM | POA: Diagnosis not present

## 2017-02-03 DIAGNOSIS — I132 Hypertensive heart and chronic kidney disease with heart failure and with stage 5 chronic kidney disease, or end stage renal disease: Secondary | ICD-10-CM | POA: Diagnosis not present

## 2017-02-03 DIAGNOSIS — N186 End stage renal disease: Secondary | ICD-10-CM | POA: Diagnosis not present

## 2017-02-03 DIAGNOSIS — E1122 Type 2 diabetes mellitus with diabetic chronic kidney disease: Secondary | ICD-10-CM | POA: Diagnosis not present

## 2017-02-03 DIAGNOSIS — I5032 Chronic diastolic (congestive) heart failure: Secondary | ICD-10-CM | POA: Diagnosis not present

## 2017-02-03 DIAGNOSIS — L89620 Pressure ulcer of left heel, unstageable: Secondary | ICD-10-CM | POA: Diagnosis not present

## 2017-02-05 DIAGNOSIS — N2581 Secondary hyperparathyroidism of renal origin: Secondary | ICD-10-CM | POA: Diagnosis not present

## 2017-02-05 DIAGNOSIS — D509 Iron deficiency anemia, unspecified: Secondary | ICD-10-CM | POA: Diagnosis not present

## 2017-02-05 DIAGNOSIS — D631 Anemia in chronic kidney disease: Secondary | ICD-10-CM | POA: Diagnosis not present

## 2017-02-05 DIAGNOSIS — N186 End stage renal disease: Secondary | ICD-10-CM | POA: Diagnosis not present

## 2017-02-05 DIAGNOSIS — E119 Type 2 diabetes mellitus without complications: Secondary | ICD-10-CM | POA: Diagnosis not present

## 2017-02-05 DIAGNOSIS — Z283 Underimmunization status: Secondary | ICD-10-CM | POA: Diagnosis not present

## 2017-02-06 DIAGNOSIS — L89620 Pressure ulcer of left heel, unstageable: Secondary | ICD-10-CM | POA: Diagnosis not present

## 2017-02-06 DIAGNOSIS — I132 Hypertensive heart and chronic kidney disease with heart failure and with stage 5 chronic kidney disease, or end stage renal disease: Secondary | ICD-10-CM | POA: Diagnosis not present

## 2017-02-06 DIAGNOSIS — J449 Chronic obstructive pulmonary disease, unspecified: Secondary | ICD-10-CM | POA: Diagnosis not present

## 2017-02-06 DIAGNOSIS — I5032 Chronic diastolic (congestive) heart failure: Secondary | ICD-10-CM | POA: Diagnosis not present

## 2017-02-06 DIAGNOSIS — N186 End stage renal disease: Secondary | ICD-10-CM | POA: Diagnosis not present

## 2017-02-06 DIAGNOSIS — E1122 Type 2 diabetes mellitus with diabetic chronic kidney disease: Secondary | ICD-10-CM | POA: Diagnosis not present

## 2017-02-07 ENCOUNTER — Ambulatory Visit (INDEPENDENT_AMBULATORY_CARE_PROVIDER_SITE_OTHER): Payer: Self-pay | Admitting: Vascular Surgery

## 2017-02-07 ENCOUNTER — Encounter: Payer: Self-pay | Admitting: Vascular Surgery

## 2017-02-07 VITALS — BP 94/54 | HR 104 | Temp 97.3°F | Resp 18 | Ht 64.0 in | Wt 266.0 lb

## 2017-02-07 DIAGNOSIS — E119 Type 2 diabetes mellitus without complications: Secondary | ICD-10-CM | POA: Diagnosis not present

## 2017-02-07 DIAGNOSIS — Z48812 Encounter for surgical aftercare following surgery on the circulatory system: Secondary | ICD-10-CM

## 2017-02-07 DIAGNOSIS — Z283 Underimmunization status: Secondary | ICD-10-CM | POA: Diagnosis not present

## 2017-02-07 DIAGNOSIS — D509 Iron deficiency anemia, unspecified: Secondary | ICD-10-CM | POA: Diagnosis not present

## 2017-02-07 DIAGNOSIS — D631 Anemia in chronic kidney disease: Secondary | ICD-10-CM | POA: Diagnosis not present

## 2017-02-07 DIAGNOSIS — N2581 Secondary hyperparathyroidism of renal origin: Secondary | ICD-10-CM | POA: Diagnosis not present

## 2017-02-07 DIAGNOSIS — N186 End stage renal disease: Secondary | ICD-10-CM | POA: Diagnosis not present

## 2017-02-07 NOTE — Progress Notes (Signed)
Patient name: Kimberly [name] MRN: [id number] DOB: 11/05/1955 Sex: female  REASON FOR VISIT:    Follow up  HPI:   Kimberly [name] is a 61 y.o. female who had a new right upper arm looped AV graft placed on 01/02/2017. She comes in for a follow up visit. She has some paresthesias in the right hand but has had that as she has neuropathy. She has no other specific complaints.  Current Outpatient Prescriptions  Medication Sig Dispense Refill  . Acetaminophen (TYLENOL PO) Take 1 each by mouth daily as needed (pain). Liquid tylenol -takes a capful    . albuterol (PROVENTIL HFA;VENTOLIN HFA) 108 (90 Base) MCG/ACT inhaler Inhale 2 puffs into the lungs every 6 (six) hours as needed for wheezing or shortness of breath.    Marland Kitchen aspirin EC 81 MG tablet Take 81 mg by mouth daily.    Marland Kitchen docusate sodium (COLACE) 100 MG capsule Take 1 capsule (100 mg total) by mouth daily. 10 capsule   . guaiFENesin-dextromethorphan (ROBITUSSIN DM) 100-10 MG/5ML syrup Take 10 mLs by mouth every 6 (six) hours as needed for cough.    . hydrocerin (EUCERIN) CREA Apply 1 application topically 2 (two) times daily. 113 g 0  . insulin glargine (LANTUS) 100 UNIT/ML injection Inject 0.4 mLs (40 Units total) into the skin 2 (two) times daily. 10 mL 11  . insulin lispro (HUMALOG) 100 UNIT/ML injection Inject 0-30 Units into the skin 3 (three) times daily before meals. Sliding Scale (also depends on what patient is eating) 100-150 = 10 units 151-200=10-15 units 201-299=20 units >300 =30 units    . levonorgestrel (MIRENA) 20 MCG/24HR IUD 1 Intra Uterine Device (1 each total) by Intrauterine route once. 1 each 0  . levothyroxine (SYNTHROID, LEVOTHROID) 88 MCG tablet Take 1 tablet (88 mcg total) by mouth daily before breakfast.    . midodrine (PROAMATINE) 10 MG tablet Take 1 tablet (10 mg total) by mouth 2 (two) times daily with a meal. (Patient taking differently: Take 10 mg by mouth 3 (three) times a week. Takes 30 minutes prior to dialysis  on Monday, Wednesday and Friday) 60 tablet 0  . Multiple Vitamins-Minerals (MULTIVITAMIN ADULT) CHEW Chew 1 tablet by mouth daily.    Marland Kitchen oxyCODONE-acetaminophen (ROXICET) 5-325 MG tablet Take 1-2 tablets by mouth every 4 (four) hours as needed. 20 tablet 0  . OXYGEN 2lpm 24/7- [facility]    . senna (SENOKOT) 8.6 MG tablet Take 2 tablets by mouth at bedtime as needed for constipation.     . sevelamer carbonate (RENVELA) 2.4 g PACK Take 2.4 g by mouth 3 (three) times daily with meals. 90 each 0  . sodium chloride (OCEAN) 0.65 % SOLN nasal spray Place 1 spray into both nostrils as needed for congestion.     No current facility-administered medications for this visit.     REVIEW OF SYSTEMS:  [X]  denotes positive finding, [ ]  denotes negative finding Cardiac  Comments:  Chest pain or chest pressure:    Shortness of breath upon exertion:    Short of breath when lying flat:    Irregular heart rhythm:    Constitutional    Fever or chills:     PHYSICAL EXAM:   Vitals:   02/07/17 0906  BP: (!) 94/54  Pulse: (!) 104  Resp: 18  Temp: 97.3 F (36.3 C)  TempSrc: Oral  SpO2: 98%  Weight: 266 lb (120.7 kg)  Height: 5\' 4"  (1.626 m)    GENERAL: The patient  is a well-nourished female, in no acute distress. The vital signs are documented above. CARDIOVASCULAR: There is a regular rate and rhythm. PULMONARY: There is good air exchange bilaterally without wheezing or rales. The graft has an excellent thrill. Her incisions are healing nicely. I cannot palpate a radial pulse are the hand is adequately perfused.  DATA:   No new data.  MEDICAL ISSUES:   STATUS POST RIGHT UPPER ARM LOOP AV GRAFT: The graft has an excellent thrill and can now be used for dialysis. Once this is working her catheter can be removed.  Kimberly [name], Kimberly [name] [facility] [phone]

## 2017-02-08 DIAGNOSIS — L97422 Non-pressure chronic ulcer of left heel and midfoot with fat layer exposed: Secondary | ICD-10-CM | POA: Diagnosis not present

## 2017-02-08 DIAGNOSIS — D649 Anemia, unspecified: Secondary | ICD-10-CM | POA: Diagnosis not present

## 2017-02-08 DIAGNOSIS — I129 Hypertensive chronic kidney disease with stage 1 through stage 4 chronic kidney disease, or unspecified chronic kidney disease: Secondary | ICD-10-CM | POA: Diagnosis not present

## 2017-02-08 DIAGNOSIS — E11621 Type 2 diabetes mellitus with foot ulcer: Secondary | ICD-10-CM | POA: Diagnosis not present

## 2017-02-08 DIAGNOSIS — I70202 Unspecified atherosclerosis of native arteries of extremities, left leg: Secondary | ICD-10-CM | POA: Diagnosis not present

## 2017-02-08 DIAGNOSIS — N186 End stage renal disease: Secondary | ICD-10-CM | POA: Diagnosis not present

## 2017-02-08 DIAGNOSIS — L89620 Pressure ulcer of left heel, unstageable: Secondary | ICD-10-CM | POA: Diagnosis not present

## 2017-02-08 DIAGNOSIS — Z992 Dependence on renal dialysis: Secondary | ICD-10-CM | POA: Diagnosis not present

## 2017-02-08 DIAGNOSIS — E1122 Type 2 diabetes mellitus with diabetic chronic kidney disease: Secondary | ICD-10-CM | POA: Diagnosis not present

## 2017-02-09 ENCOUNTER — Other Ambulatory Visit: Payer: Self-pay | Admitting: Internal Medicine

## 2017-02-09 DIAGNOSIS — E119 Type 2 diabetes mellitus without complications: Secondary | ICD-10-CM | POA: Diagnosis not present

## 2017-02-09 DIAGNOSIS — N2581 Secondary hyperparathyroidism of renal origin: Secondary | ICD-10-CM | POA: Diagnosis not present

## 2017-02-09 DIAGNOSIS — D631 Anemia in chronic kidney disease: Secondary | ICD-10-CM | POA: Diagnosis not present

## 2017-02-09 DIAGNOSIS — D509 Iron deficiency anemia, unspecified: Secondary | ICD-10-CM | POA: Diagnosis not present

## 2017-02-09 DIAGNOSIS — N186 End stage renal disease: Secondary | ICD-10-CM | POA: Diagnosis not present

## 2017-02-09 DIAGNOSIS — S91309A Unspecified open wound, unspecified foot, initial encounter: Secondary | ICD-10-CM | POA: Diagnosis not present

## 2017-02-09 DIAGNOSIS — L89623 Pressure ulcer of left heel, stage 3: Secondary | ICD-10-CM

## 2017-02-10 DIAGNOSIS — N186 End stage renal disease: Secondary | ICD-10-CM | POA: Diagnosis not present

## 2017-02-10 DIAGNOSIS — L89620 Pressure ulcer of left heel, unstageable: Secondary | ICD-10-CM | POA: Diagnosis not present

## 2017-02-10 DIAGNOSIS — I5032 Chronic diastolic (congestive) heart failure: Secondary | ICD-10-CM | POA: Diagnosis not present

## 2017-02-10 DIAGNOSIS — J449 Chronic obstructive pulmonary disease, unspecified: Secondary | ICD-10-CM | POA: Diagnosis not present

## 2017-02-10 DIAGNOSIS — I132 Hypertensive heart and chronic kidney disease with heart failure and with stage 5 chronic kidney disease, or end stage renal disease: Secondary | ICD-10-CM | POA: Diagnosis not present

## 2017-02-10 DIAGNOSIS — E1122 Type 2 diabetes mellitus with diabetic chronic kidney disease: Secondary | ICD-10-CM | POA: Diagnosis not present

## 2017-02-12 DIAGNOSIS — S91309A Unspecified open wound, unspecified foot, initial encounter: Secondary | ICD-10-CM | POA: Diagnosis not present

## 2017-02-12 DIAGNOSIS — D509 Iron deficiency anemia, unspecified: Secondary | ICD-10-CM | POA: Diagnosis not present

## 2017-02-12 DIAGNOSIS — N186 End stage renal disease: Secondary | ICD-10-CM | POA: Diagnosis not present

## 2017-02-12 DIAGNOSIS — N2581 Secondary hyperparathyroidism of renal origin: Secondary | ICD-10-CM | POA: Diagnosis not present

## 2017-02-12 DIAGNOSIS — E119 Type 2 diabetes mellitus without complications: Secondary | ICD-10-CM | POA: Diagnosis not present

## 2017-02-12 DIAGNOSIS — D631 Anemia in chronic kidney disease: Secondary | ICD-10-CM | POA: Diagnosis not present

## 2017-02-13 DIAGNOSIS — I5032 Chronic diastolic (congestive) heart failure: Secondary | ICD-10-CM | POA: Diagnosis not present

## 2017-02-13 DIAGNOSIS — I132 Hypertensive heart and chronic kidney disease with heart failure and with stage 5 chronic kidney disease, or end stage renal disease: Secondary | ICD-10-CM | POA: Diagnosis not present

## 2017-02-13 DIAGNOSIS — N186 End stage renal disease: Secondary | ICD-10-CM | POA: Diagnosis not present

## 2017-02-13 DIAGNOSIS — E1122 Type 2 diabetes mellitus with diabetic chronic kidney disease: Secondary | ICD-10-CM | POA: Diagnosis not present

## 2017-02-13 DIAGNOSIS — L89620 Pressure ulcer of left heel, unstageable: Secondary | ICD-10-CM | POA: Diagnosis not present

## 2017-02-13 DIAGNOSIS — J449 Chronic obstructive pulmonary disease, unspecified: Secondary | ICD-10-CM | POA: Diagnosis not present

## 2017-02-14 ENCOUNTER — Encounter (HOSPITAL_COMMUNITY): Payer: Self-pay

## 2017-02-14 ENCOUNTER — Inpatient Hospital Stay (HOSPITAL_COMMUNITY): Payer: Medicare Other

## 2017-02-14 ENCOUNTER — Inpatient Hospital Stay (HOSPITAL_COMMUNITY)
Admission: EM | Admit: 2017-02-14 | Discharge: 2017-02-20 | DRG: 853 | Disposition: A | Discharge status: Skilled Nursing Facility | Payer: Medicare Other | Attending: Internal Medicine | Admitting: Internal Medicine

## 2017-02-14 DIAGNOSIS — J9611 Chronic respiratory failure with hypoxia: Secondary | ICD-10-CM | POA: Diagnosis not present

## 2017-02-14 DIAGNOSIS — N2581 Secondary hyperparathyroidism of renal origin: Secondary | ICD-10-CM | POA: Diagnosis present

## 2017-02-14 DIAGNOSIS — Z888 Allergy status to other drugs, medicaments and biological substances status: Secondary | ICD-10-CM

## 2017-02-14 DIAGNOSIS — E785 Hyperlipidemia, unspecified: Secondary | ICD-10-CM | POA: Diagnosis present

## 2017-02-14 DIAGNOSIS — E46 Unspecified protein-calorie malnutrition: Secondary | ICD-10-CM | POA: Diagnosis present

## 2017-02-14 DIAGNOSIS — E662 Morbid (severe) obesity with alveolar hypoventilation: Secondary | ICD-10-CM | POA: Diagnosis not present

## 2017-02-14 DIAGNOSIS — I96 Gangrene, not elsewhere classified: Secondary | ICD-10-CM | POA: Diagnosis not present

## 2017-02-14 DIAGNOSIS — Z794 Long term (current) use of insulin: Secondary | ICD-10-CM | POA: Diagnosis not present

## 2017-02-14 DIAGNOSIS — I9589 Other hypotension: Secondary | ICD-10-CM | POA: Diagnosis not present

## 2017-02-14 DIAGNOSIS — I70245 Atherosclerosis of native arteries of left leg with ulceration of other part of foot: Secondary | ICD-10-CM | POA: Diagnosis not present

## 2017-02-14 DIAGNOSIS — R0902 Hypoxemia: Secondary | ICD-10-CM

## 2017-02-14 DIAGNOSIS — E1122 Type 2 diabetes mellitus with diabetic chronic kidney disease: Secondary | ICD-10-CM | POA: Diagnosis not present

## 2017-02-14 DIAGNOSIS — L89152 Pressure ulcer of sacral region, stage 2: Secondary | ICD-10-CM | POA: Diagnosis not present

## 2017-02-14 DIAGNOSIS — E039 Hypothyroidism, unspecified: Secondary | ICD-10-CM | POA: Diagnosis present

## 2017-02-14 DIAGNOSIS — I69391 Dysphagia following cerebral infarction: Secondary | ICD-10-CM

## 2017-02-14 DIAGNOSIS — L03116 Cellulitis of left lower limb: Secondary | ICD-10-CM | POA: Diagnosis present

## 2017-02-14 DIAGNOSIS — E871 Hypo-osmolality and hyponatremia: Secondary | ICD-10-CM | POA: Diagnosis present

## 2017-02-14 DIAGNOSIS — R0602 Shortness of breath: Secondary | ICD-10-CM | POA: Diagnosis not present

## 2017-02-14 DIAGNOSIS — M86672 Other chronic osteomyelitis, left ankle and foot: Secondary | ICD-10-CM | POA: Diagnosis present

## 2017-02-14 DIAGNOSIS — I10 Essential (primary) hypertension: Secondary | ICD-10-CM | POA: Diagnosis not present

## 2017-02-14 DIAGNOSIS — I5032 Chronic diastolic (congestive) heart failure: Secondary | ICD-10-CM | POA: Diagnosis not present

## 2017-02-14 DIAGNOSIS — I70269 Atherosclerosis of native arteries of extremities with gangrene, unspecified extremity: Secondary | ICD-10-CM | POA: Diagnosis not present

## 2017-02-14 DIAGNOSIS — E875 Hyperkalemia: Secondary | ICD-10-CM | POA: Diagnosis not present

## 2017-02-14 DIAGNOSIS — I5020 Unspecified systolic (congestive) heart failure: Secondary | ICD-10-CM | POA: Diagnosis not present

## 2017-02-14 DIAGNOSIS — E1169 Type 2 diabetes mellitus with other specified complication: Secondary | ICD-10-CM | POA: Diagnosis present

## 2017-02-14 DIAGNOSIS — Z961 Presence of intraocular lens: Secondary | ICD-10-CM | POA: Diagnosis present

## 2017-02-14 DIAGNOSIS — I119 Hypertensive heart disease without heart failure: Secondary | ICD-10-CM | POA: Diagnosis present

## 2017-02-14 DIAGNOSIS — S8990XA Unspecified injury of unspecified lower leg, initial encounter: Secondary | ICD-10-CM | POA: Diagnosis not present

## 2017-02-14 DIAGNOSIS — Z803 Family history of malignant neoplasm of breast: Secondary | ICD-10-CM

## 2017-02-14 DIAGNOSIS — L03119 Cellulitis of unspecified part of limb: Secondary | ICD-10-CM | POA: Diagnosis not present

## 2017-02-14 DIAGNOSIS — E1129 Type 2 diabetes mellitus with other diabetic kidney complication: Secondary | ICD-10-CM | POA: Diagnosis not present

## 2017-02-14 DIAGNOSIS — J441 Chronic obstructive pulmonary disease with (acute) exacerbation: Secondary | ICD-10-CM

## 2017-02-14 DIAGNOSIS — Z9842 Cataract extraction status, left eye: Secondary | ICD-10-CM

## 2017-02-14 DIAGNOSIS — I110 Hypertensive heart disease with heart failure: Secondary | ICD-10-CM | POA: Diagnosis not present

## 2017-02-14 DIAGNOSIS — A419 Sepsis, unspecified organism: Secondary | ICD-10-CM | POA: Diagnosis present

## 2017-02-14 DIAGNOSIS — Z8249 Family history of ischemic heart disease and other diseases of the circulatory system: Secondary | ICD-10-CM

## 2017-02-14 DIAGNOSIS — Z9981 Dependence on supplemental oxygen: Secondary | ICD-10-CM | POA: Diagnosis not present

## 2017-02-14 DIAGNOSIS — E1152 Type 2 diabetes mellitus with diabetic peripheral angiopathy with gangrene: Secondary | ICD-10-CM | POA: Diagnosis present

## 2017-02-14 DIAGNOSIS — J449 Chronic obstructive pulmonary disease, unspecified: Secondary | ICD-10-CM | POA: Diagnosis not present

## 2017-02-14 DIAGNOSIS — Z7982 Long term (current) use of aspirin: Secondary | ICD-10-CM

## 2017-02-14 DIAGNOSIS — Z6841 Body Mass Index (BMI) 40.0 and over, adult: Secondary | ICD-10-CM | POA: Diagnosis not present

## 2017-02-14 DIAGNOSIS — Z22322 Carrier or suspected carrier of Methicillin resistant Staphylococcus aureus: Secondary | ICD-10-CM

## 2017-02-14 DIAGNOSIS — E1140 Type 2 diabetes mellitus with diabetic neuropathy, unspecified: Secondary | ICD-10-CM | POA: Diagnosis present

## 2017-02-14 DIAGNOSIS — E118 Type 2 diabetes mellitus with unspecified complications: Secondary | ICD-10-CM | POA: Diagnosis not present

## 2017-02-14 DIAGNOSIS — E6609 Other obesity due to excess calories: Secondary | ICD-10-CM | POA: Diagnosis not present

## 2017-02-14 DIAGNOSIS — Z9841 Cataract extraction status, right eye: Secondary | ICD-10-CM

## 2017-02-14 DIAGNOSIS — M19072 Primary osteoarthritis, left ankle and foot: Secondary | ICD-10-CM | POA: Diagnosis present

## 2017-02-14 DIAGNOSIS — Z87891 Personal history of nicotine dependence: Secondary | ICD-10-CM

## 2017-02-14 DIAGNOSIS — I480 Paroxysmal atrial fibrillation: Secondary | ICD-10-CM

## 2017-02-14 DIAGNOSIS — N186 End stage renal disease: Secondary | ICD-10-CM | POA: Diagnosis not present

## 2017-02-14 DIAGNOSIS — D631 Anemia in chronic kidney disease: Secondary | ICD-10-CM | POA: Diagnosis not present

## 2017-02-14 DIAGNOSIS — M19071 Primary osteoarthritis, right ankle and foot: Secondary | ICD-10-CM | POA: Diagnosis present

## 2017-02-14 DIAGNOSIS — R079 Chest pain, unspecified: Secondary | ICD-10-CM | POA: Diagnosis not present

## 2017-02-14 DIAGNOSIS — E113599 Type 2 diabetes mellitus with proliferative diabetic retinopathy without macular edema, unspecified eye: Secondary | ICD-10-CM | POA: Diagnosis present

## 2017-02-14 DIAGNOSIS — R69 Illness, unspecified: Secondary | ICD-10-CM

## 2017-02-14 DIAGNOSIS — I509 Heart failure, unspecified: Secondary | ICD-10-CM

## 2017-02-14 DIAGNOSIS — B999 Unspecified infectious disease: Secondary | ICD-10-CM | POA: Diagnosis not present

## 2017-02-14 DIAGNOSIS — E1121 Type 2 diabetes mellitus with diabetic nephropathy: Secondary | ICD-10-CM | POA: Diagnosis not present

## 2017-02-14 DIAGNOSIS — I132 Hypertensive heart and chronic kidney disease with heart failure and with stage 5 chronic kidney disease, or end stage renal disease: Secondary | ICD-10-CM | POA: Diagnosis present

## 2017-02-14 DIAGNOSIS — R0603 Acute respiratory distress: Secondary | ICD-10-CM

## 2017-02-14 DIAGNOSIS — I2510 Atherosclerotic heart disease of native coronary artery without angina pectoris: Secondary | ICD-10-CM | POA: Diagnosis present

## 2017-02-14 DIAGNOSIS — Z7401 Bed confinement status: Secondary | ICD-10-CM

## 2017-02-14 DIAGNOSIS — A4150 Gram-negative sepsis, unspecified: Principal | ICD-10-CM | POA: Diagnosis present

## 2017-02-14 DIAGNOSIS — M170 Bilateral primary osteoarthritis of knee: Secondary | ICD-10-CM | POA: Diagnosis present

## 2017-02-14 DIAGNOSIS — M19042 Primary osteoarthritis, left hand: Secondary | ICD-10-CM | POA: Diagnosis present

## 2017-02-14 DIAGNOSIS — Z992 Dependence on renal dialysis: Secondary | ICD-10-CM

## 2017-02-14 DIAGNOSIS — Z993 Dependence on wheelchair: Secondary | ICD-10-CM

## 2017-02-14 DIAGNOSIS — L97523 Non-pressure chronic ulcer of other part of left foot with necrosis of muscle: Secondary | ICD-10-CM | POA: Diagnosis present

## 2017-02-14 DIAGNOSIS — E11621 Type 2 diabetes mellitus with foot ulcer: Secondary | ICD-10-CM | POA: Diagnosis present

## 2017-02-14 DIAGNOSIS — G2581 Restless legs syndrome: Secondary | ICD-10-CM | POA: Diagnosis present

## 2017-02-14 DIAGNOSIS — Z862 Personal history of diseases of the blood and blood-forming organs and certain disorders involving the immune mechanism: Secondary | ICD-10-CM | POA: Diagnosis not present

## 2017-02-14 DIAGNOSIS — Z87448 Personal history of other diseases of urinary system: Secondary | ICD-10-CM | POA: Diagnosis not present

## 2017-02-14 DIAGNOSIS — T827XXA Infection and inflammatory reaction due to other cardiac and vascular devices, implants and grafts, initial encounter: Secondary | ICD-10-CM | POA: Diagnosis not present

## 2017-02-14 DIAGNOSIS — N179 Acute kidney failure, unspecified: Secondary | ICD-10-CM | POA: Diagnosis not present

## 2017-02-14 DIAGNOSIS — R7881 Bacteremia: Secondary | ICD-10-CM | POA: Diagnosis not present

## 2017-02-14 DIAGNOSIS — K219 Gastro-esophageal reflux disease without esophagitis: Secondary | ICD-10-CM | POA: Diagnosis present

## 2017-02-14 DIAGNOSIS — I252 Old myocardial infarction: Secondary | ICD-10-CM

## 2017-02-14 DIAGNOSIS — Z4901 Encounter for fitting and adjustment of extracorporeal dialysis catheter: Secondary | ICD-10-CM | POA: Diagnosis not present

## 2017-02-14 DIAGNOSIS — Z825 Family history of asthma and other chronic lower respiratory diseases: Secondary | ICD-10-CM

## 2017-02-14 DIAGNOSIS — A480 Gas gangrene: Secondary | ICD-10-CM | POA: Diagnosis not present

## 2017-02-14 DIAGNOSIS — L97429 Non-pressure chronic ulcer of left heel and midfoot with unspecified severity: Secondary | ICD-10-CM | POA: Diagnosis not present

## 2017-02-14 DIAGNOSIS — Z66 Do not resuscitate: Secondary | ICD-10-CM | POA: Diagnosis present

## 2017-02-14 DIAGNOSIS — I70244 Atherosclerosis of native arteries of left leg with ulceration of heel and midfoot: Secondary | ICD-10-CM | POA: Diagnosis not present

## 2017-02-14 DIAGNOSIS — Z823 Family history of stroke: Secondary | ICD-10-CM

## 2017-02-14 DIAGNOSIS — L02419 Cutaneous abscess of limb, unspecified: Secondary | ICD-10-CM | POA: Diagnosis present

## 2017-02-14 DIAGNOSIS — L97529 Non-pressure chronic ulcer of other part of left foot with unspecified severity: Secondary | ICD-10-CM | POA: Diagnosis not present

## 2017-02-14 DIAGNOSIS — M19041 Primary osteoarthritis, right hand: Secondary | ICD-10-CM | POA: Diagnosis present

## 2017-02-14 DIAGNOSIS — Z881 Allergy status to other antibiotic agents status: Secondary | ICD-10-CM

## 2017-02-14 LAB — COMPREHENSIVE METABOLIC PANEL
ALK PHOS: 303 U/L — AB (ref 38–126)
ALT: 26 U/L (ref 14–54)
ANION GAP: 16 — AB (ref 5–15)
AST: 25 U/L (ref 15–41)
Albumin: 2.6 g/dL — ABNORMAL LOW (ref 3.5–5.0)
BILIRUBIN TOTAL: 0.9 mg/dL (ref 0.3–1.2)
BUN: 48 mg/dL — AB (ref 6–20)
CALCIUM: 9.2 mg/dL (ref 8.9–10.3)
CO2: 20 mmol/L — ABNORMAL LOW (ref 22–32)
Chloride: 88 mmol/L — ABNORMAL LOW (ref 101–111)
Creatinine, Ser: 5.63 mg/dL — ABNORMAL HIGH (ref 0.44–1.00)
GFR calc Af Amer: 8 mL/min — ABNORMAL LOW (ref >60)
GFR, EST NON AFRICAN AMERICAN: 7 mL/min — AB (ref >60)
Glucose, Bld: 400 mg/dL — ABNORMAL HIGH (ref 65–99)
POTASSIUM: 5.6 mmol/L — AB (ref 3.5–5.1)
Sodium: 124 mmol/L — ABNORMAL LOW (ref 135–145)
TOTAL PROTEIN: 8.8 g/dL — AB (ref 6.5–8.1)

## 2017-02-14 LAB — CBC WITH DIFFERENTIAL/PLATELET
BASOS ABS: 0 10*3/uL (ref 0.0–0.1)
BASOS PCT: 0 %
EOS PCT: 0 %
Eosinophils Absolute: 0.1 10*3/uL (ref 0.0–0.7)
HCT: 28.9 % — ABNORMAL LOW (ref 36.0–46.0)
Hemoglobin: 9.1 g/dL — ABNORMAL LOW (ref 12.0–15.0)
LYMPHS PCT: 5 %
Lymphs Abs: 1 10*3/uL (ref 0.7–4.0)
MCH: 31.9 pg (ref 26.0–34.0)
MCHC: 31.5 g/dL (ref 30.0–36.0)
MCV: 101.4 fL — AB (ref 78.0–100.0)
Monocytes Absolute: 1 10*3/uL (ref 0.1–1.0)
Monocytes Relative: 4 %
Neutro Abs: 20.7 10*3/uL — ABNORMAL HIGH (ref 1.7–7.7)
Neutrophils Relative %: 91 %
PLATELETS: 240 10*3/uL (ref 150–400)
RBC: 2.85 MIL/uL — AB (ref 3.87–5.11)
RDW: 16.2 % — AB (ref 11.5–15.5)
WBC: 22.8 10*3/uL — AB (ref 4.0–10.5)

## 2017-02-14 LAB — I-STAT CG4 LACTIC ACID, ED
LACTIC ACID, VENOUS: 3.24 mmol/L — AB (ref 0.5–1.9)
LACTIC ACID, VENOUS: 1.97 mmol/L — AB (ref 0.5–1.9)

## 2017-02-14 LAB — MRSA PCR SCREENING: MRSA by PCR: POSITIVE — AB

## 2017-02-14 LAB — MAGNESIUM: MAGNESIUM: 2 mg/dL (ref 1.7–2.4)

## 2017-02-14 LAB — PROCALCITONIN: Procalcitonin: 2.51 ng/mL

## 2017-02-14 LAB — GLUCOSE, CAPILLARY: GLUCOSE-CAPILLARY: 170 mg/dL — AB (ref 65–99)

## 2017-02-14 LAB — CBG MONITORING, ED: Glucose-Capillary: 285 mg/dL — ABNORMAL HIGH (ref 65–99)

## 2017-02-14 MED ORDER — SENNA 8.6 MG PO TABS
17.2000 mg | ORAL_TABLET | Freq: Every evening | ORAL | Status: DC | PRN
  Administered 2017-02-19: 17.2 mg via ORAL
  Filled 2017-02-14: qty 2

## 2017-02-14 MED ORDER — OXYCODONE-ACETAMINOPHEN 5-325 MG PO TABS: 1.0000 | ORAL_TABLET | ORAL | Status: DC | PRN

## 2017-02-14 MED ORDER — LIDOCAINE HCL (PF) 1 % IJ SOLN: 5.0000 mL | INTRAMUSCULAR | Status: DC | PRN

## 2017-02-14 MED ORDER — ASPIRIN EC 81 MG PO TBEC
81.0000 mg | DELAYED_RELEASE_TABLET | Freq: Every day | ORAL | Status: DC
  Administered 2017-02-15 – 2017-02-20 (×6): 81 mg via ORAL
  Filled 2017-02-14 (×6): qty 1

## 2017-02-14 MED ORDER — LEVOTHYROXINE SODIUM 88 MCG PO TABS
88.0000 ug | ORAL_TABLET | Freq: Every day | ORAL | Status: DC
Start: 2017-02-15 — End: 2017-02-20
  Administered 2017-02-15 – 2017-02-20 (×6): 88 ug via ORAL
  Filled 2017-02-14 (×7): qty 1

## 2017-02-14 MED ORDER — VANCOMYCIN HCL IN DEXTROSE 1-5 GM/200ML-% IV SOLN: 1000.0000 mg | INTRAVENOUS | Status: DC

## 2017-02-14 MED ORDER — ONDANSETRON HCL 4 MG/2ML IJ SOLN
4.0000 mg | Freq: Four times a day (QID) | INTRAMUSCULAR | Status: DC | PRN
  Administered 2017-02-16: 4 mg via INTRAVENOUS

## 2017-02-14 MED ORDER — ONDANSETRON HCL 4 MG PO TABS: 4.0000 mg | ORAL_TABLET | Freq: Four times a day (QID) | ORAL | Status: DC | PRN

## 2017-02-14 MED ORDER — PENTAFLUOROPROP-TETRAFLUOROETH EX AERO: 1.0000 "application " | INHALATION_SPRAY | CUTANEOUS | Status: DC | PRN

## 2017-02-14 MED ORDER — MUPIROCIN 2 % EX OINT
1.0000 "application " | TOPICAL_OINTMENT | Freq: Two times a day (BID) | CUTANEOUS | Status: AC
  Administered 2017-02-14 – 2017-02-19 (×9): 1 via NASAL
  Filled 2017-02-14 (×2): qty 22

## 2017-02-14 MED ORDER — PIPERACILLIN-TAZOBACTAM 3.375 G IVPB 30 MIN
3.3750 g | Freq: Once | INTRAVENOUS | Status: AC
  Administered 2017-02-14: 3.375 g via INTRAVENOUS
  Filled 2017-02-14: qty 50

## 2017-02-14 MED ORDER — SODIUM CHLORIDE 0.9 % IV SOLN: 100.0000 mL | INTRAVENOUS | Status: DC | PRN

## 2017-02-14 MED ORDER — INSULIN GLARGINE 100 UNIT/ML ~~LOC~~ SOLN
20.0000 [IU] | Freq: Two times a day (BID) | SUBCUTANEOUS | Status: DC
  Administered 2017-02-14 – 2017-02-17 (×5): 20 [IU] via SUBCUTANEOUS
  Filled 2017-02-14 (×6): qty 0.2

## 2017-02-14 MED ORDER — DOCUSATE SODIUM 100 MG PO CAPS
100.0000 mg | ORAL_CAPSULE | Freq: Every day | ORAL | Status: DC
  Filled 2017-02-14 (×2): qty 1

## 2017-02-14 MED ORDER — CHLORHEXIDINE GLUCONATE CLOTH 2 % EX PADS
6.0000 | MEDICATED_PAD | Freq: Every day | CUTANEOUS | Status: AC
  Administered 2017-02-15 – 2017-02-18 (×4): 6 via TOPICAL

## 2017-02-14 MED ORDER — ACETAMINOPHEN 325 MG PO TABS: 650.0000 mg | ORAL_TABLET | Freq: Four times a day (QID) | ORAL | Status: DC | PRN

## 2017-02-14 MED ORDER — ACETAMINOPHEN 650 MG RE SUPP: 650.0000 mg | Freq: Four times a day (QID) | RECTAL | Status: DC | PRN

## 2017-02-14 MED ORDER — PIPERACILLIN-TAZOBACTAM 3.375 G IVPB
3.3750 g | Freq: Two times a day (BID) | INTRAVENOUS | Status: DC
  Administered 2017-02-14 – 2017-02-17 (×7): 3.375 g via INTRAVENOUS
  Filled 2017-02-14 (×7): qty 50

## 2017-02-14 MED ORDER — MIDODRINE HCL 5 MG PO TABS
10.0000 mg | ORAL_TABLET | ORAL | Status: DC
  Administered 2017-02-14 – 2017-02-16 (×2): 10 mg via ORAL
  Filled 2017-02-14: qty 2

## 2017-02-14 MED ORDER — VANCOMYCIN HCL IN DEXTROSE 1-5 GM/200ML-% IV SOLN
1000.0000 mg | INTRAVENOUS | Status: DC
  Administered 2017-02-14 – 2017-02-16 (×2): 1000 mg via INTRAVENOUS
  Filled 2017-02-14 (×3): qty 200

## 2017-02-14 MED ORDER — VANCOMYCIN HCL IN DEXTROSE 1-5 GM/200ML-% IV SOLN
INTRAVENOUS | Status: AC
  Filled 2017-02-14: qty 200

## 2017-02-14 MED ORDER — MIDODRINE HCL 5 MG PO TABS: 10.0000 mg | ORAL_TABLET | ORAL | Status: DC

## 2017-02-14 MED ORDER — HEPARIN SODIUM (PORCINE) 1000 UNIT/ML DIALYSIS: 1000.0000 [IU] | INTRAMUSCULAR | Status: DC | PRN

## 2017-02-14 MED ORDER — HEPARIN SODIUM (PORCINE) 5000 UNIT/ML IJ SOLN
5000.0000 [IU] | Freq: Three times a day (TID) | INTRAMUSCULAR | Status: DC
  Administered 2017-02-14 – 2017-02-20 (×15): 5000 [IU] via SUBCUTANEOUS
  Filled 2017-02-14 (×15): qty 1

## 2017-02-14 MED ORDER — SEVELAMER CARBONATE 800 MG PO TABS
2400.0000 mg | ORAL_TABLET | Freq: Three times a day (TID) | ORAL | Status: DC
  Administered 2017-02-15 – 2017-02-17 (×5): 2400 mg via ORAL
  Filled 2017-02-14 (×7): qty 3

## 2017-02-14 MED ORDER — VANCOMYCIN HCL IN DEXTROSE 1-5 GM/200ML-% IV SOLN: 1000.0000 mg | Freq: Once | INTRAVENOUS | Status: DC

## 2017-02-14 MED ORDER — LIDOCAINE-PRILOCAINE 2.5-2.5 % EX CREA: 1.0000 "application " | TOPICAL_CREAM | CUTANEOUS | Status: DC | PRN

## 2017-02-14 MED ORDER — VANCOMYCIN HCL 10 G IV SOLR
2000.0000 mg | Freq: Once | INTRAVENOUS | Status: AC
  Administered 2017-02-14: 2000 mg via INTRAVENOUS
  Filled 2017-02-14 (×2): qty 2000

## 2017-02-14 MED ORDER — INSULIN ASPART 100 UNIT/ML ~~LOC~~ SOLN
0.0000 [IU] | Freq: Three times a day (TID) | SUBCUTANEOUS | Status: DC
  Administered 2017-02-14: 5 [IU] via SUBCUTANEOUS
  Administered 2017-02-15: 1 [IU] via SUBCUTANEOUS
  Administered 2017-02-15: 3 [IU] via SUBCUTANEOUS
  Administered 2017-02-15 – 2017-02-17 (×4): 2 [IU] via SUBCUTANEOUS
  Administered 2017-02-18: 3 [IU] via SUBCUTANEOUS
  Administered 2017-02-18: 7 [IU] via SUBCUTANEOUS
  Administered 2017-02-20 (×2): 3 [IU] via SUBCUTANEOUS
  Filled 2017-02-14: qty 1

## 2017-02-14 MED ORDER — ALBUTEROL SULFATE (2.5 MG/3ML) 0.083% IN NEBU: 2.5000 mg | INHALATION_SOLUTION | Freq: Four times a day (QID) | RESPIRATORY_TRACT | Status: DC | PRN

## 2017-02-14 MED ORDER — HEPARIN SODIUM (PORCINE) 1000 UNIT/ML DIALYSIS: 6000.0000 [IU] | Freq: Once | INTRAMUSCULAR | Status: DC

## 2017-02-14 NOTE — Progress Notes (Signed)
Patient on assessment complained of right breast pain and reported that when she gets her outpatient HD that the way the chair is set up that her breast gets smashed and she appears to have a scab on the right nipple. Pt reports that the nipple constantly hurts.

## 2017-02-14 NOTE — ED Triage Notes (Signed)
Patient complains of increased open areas to bottom of left foot that occurred since Sunday. [facility] is also due for dialysis

## 2017-02-14 NOTE — Care Management Note (Addendum)
Case Management Note  Patient Details  Name: Kimberly [name] MRN: [id number] Date of Birth: 09-14-1955  Subjective/Objective:    From home with spouse, presents with left foot cellulitis, has ESRD M,W, F , hypotension, DM2, chronic chf, Chronic resp failure, hypothyroidism.  She has  Medicare/Generic Nurse, learning disabilityCommercial insurance.  Per pt eval rec SNF, CSW referral.   6/8 1004 [name] RN, BSN - patient in HD now, plan for Left BKA today.  PCP   [name] Stoneking             Action/Plan: NCM will cont to follow for dc needs.   Expected Discharge Date:                  Expected Discharge Plan:     In-House Referral:     Discharge planning Services  CM Consult  Post Acute Care Choice:    Choice offered to:     DME Arranged:    DME Agency:     HH Arranged:    HH Agency:     Status of Service:  In process, will continue to follow  If discussed at Long Length of Stay Meetings, dates discussed:    Additional Comments:  [name], [name], RN 02/14/2017, 3:18 PM

## 2017-02-14 NOTE — ED Notes (Signed)
Attempted to call report

## 2017-02-14 NOTE — ED Notes (Signed)
Dr. Konrad DoloresMerrell made aware of BP 82/48.

## 2017-02-14 NOTE — ED Notes (Signed)
ED Provider at bedside. 

## 2017-02-14 NOTE — Procedures (Signed)
I was present at this dialysis session. I have reviewed the session itself and made appropriate changes.   Filed Weights   02/14/17 1422 02/14/17 1510  Weight: 118.5 kg (261 lb 3.9 oz) 117.3 kg (258 lb 9.6 oz)     Recent Labs Lab 02/14/17 0957  NA 124*  K 5.6*  CL 88*  CO2 20*  GLUCOSE 400*  BUN 48*  CREATININE 5.63*  CALCIUM 9.2     Recent Labs Lab 02/14/17 0957  WBC 22.8*  NEUTROABS 20.7*  HGB 9.1*  HCT 28.9*  MCV 101.4*  PLT 240    Scheduled Meds: . [START ON 02/15/2017] aspirin EC  81 mg Oral Daily  . [START ON 02/15/2017] Chlorhexidine Gluconate Cloth  6 each Topical Q0600  . [START ON 02/15/2017] docusate sodium  100 mg Oral Daily  . heparin  5,000 Units Subcutaneous Q8H  . heparin  6,000 Units Dialysis Once in dialysis  . insulin aspart  0-9 Units Subcutaneous TID WC  . insulin glargine  20 Units Subcutaneous BID  . [START ON 02/15/2017] levothyroxine  88 mcg Oral QAC breakfast  . midodrine  10 mg Oral Once per day on Mon Wed Fri  . [START ON 02/16/2017] midodrine  10 mg Oral Q M,W,F-HD  . mupirocin ointment  1 application Nasal BID  . [START ON 02/15/2017] sevelamer carbonate  2,400 mg Oral TID WC   Continuous Infusions: . sodium chloride    . sodium chloride    . piperacillin-tazobactam (ZOSYN)  IV    . vancomycin     PRN Meds:.sodium chloride, sodium chloride, acetaminophen **OR** acetaminophen, albuterol, heparin, lidocaine (PF), lidocaine-prilocaine, ondansetron **OR** ondansetron (ZOFRAN) IV, oxyCODONE-acetaminophen, pentafluoroprop-tetrafluoroeth, senna   Kimberly CordsJoseph A Kalayna Noy,  MD 02/14/2017, 5:31 PM

## 2017-02-14 NOTE — H&P (Signed)
History and Physical    Kimberly [name]:[id number] DOB: [birth year] DOA: 02/14/2017   PCP: Merlene Laughter, MD   Patient coming from:  Home    Chief Complaint: Left foot cellulitis   HPI: Kimberly [name] is a 61 y.o. female with extensive medical history listed below including peripheral vascular disease, diabetes, ESRB on hemodialysis Monday Wednesday Friday, do today. The patient has been noticing worsening left foot 1 over the last 2 days. This was being treated by the wound care clinic and Dr. Roxan Hockey. Since  yesterday, that wound has advanced significantly, with aerythema a and drainageThe patient denies any worsening lower extremity swelling, but she reports today that this edema is controlled. No recent surgeries in the area. She is up to date with her medications. Denies any insect bite, or walking barefoot.  She denies any tobacco, alcohol or recreational drug use. She was referred to come directly to the ED for further evaluation. She denies any fever chills or night sweats. She denies chest pain or palpitations. She denies any abdominal pain. She makes very little urine, but denies dysuria or hematuria. No confusion is reported   ED Course:  BP (!) 110/52   Pulse 98   Temp 98.4 F (36.9 C) (Oral)   Resp 16   SpO2 100%    sodium 124 potassium 5.6 bicarb 20 glucose 400 BUN 48 creatinine 5.63 calcium 9.2 anion gap  16 albumin 2.6 total bilirubin 0.9 GFR 7 lactic acid 3.24 urinalysis pending  EKG sinus rhythm, left bundle branch block, no acute changes from prior. Last ABI  was taken in all goes 2016, with normal Arterial flow bilaterally CXR pending  Left foot XR pending  Review of Systems:  As per HPI otherwise all other systems reviewed and are negative  Past Medical History:  Diagnosis Date  . Anemia   . Anxiety   . Arthritis    "knees, feet, hands" (08/23/2016)  . Asthma   . CAD, NATIVE VESSEL    May 10, 2010 cath showed a hyperdynamic LV function, she had dominant  circumflex anatomy with a 70-80% small OM1. She had diffuse diabetic plaque particularly in the distal LAD. She nondominant RCA.  Nondominant  . Cellulitis 10/15/2013  . CHF (congestive heart failure) (HCC)    Preserved EF  . Chronic bronchitis (HCC)    "probably once/ yr" (08/23/2016)  . Complication of anesthesia    "I've had difficulty waking up" (08/23/2016)  . COPD   . Depression   . Diabetic neuropathy (HCC)   . Dyspnea   . Endometrial hyperplasia   . ESRD (end stage renal disease) on dialysis (HCC)    "[facility]; MWF; [facility]" (08/23/2016)  . Family history of adverse reaction to anesthesia    mother had hard time waking up  . GERD (gastroesophageal reflux disease)   . History of hiatal hernia   . HYPERLIPIDEMIA   . HYPERTENSION   . Hypothyroidism   . Myocardial infarct, old   . On home oxygen therapy    "2L; 24/7" (08/23/2016)  . Paralyzed vocal cords   . Peripheral neuropathy    hx/notes 01/27/2010  . Pneumonia "several times"  . Proliferative retinopathy    hx/notes 01/27/2010  . PVD    CEA  . Restless leg syndrome    "mostly on the right" (08/23/2016)  . 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 Type II diabetes mellitus (HCC)     Past Surgical History:  Procedure Laterality Date  . AV FISTULA PLACEMENT Right 07/08/2015   Procedure: EXPLORATION RIGHT AXILLARY ARTERY AND RIGHT BRACHIAL VEIN;  Surgeon: Chuck Hint, MD;  Location: [facility] OR;  Service: Vascular;  Laterality: Right;  . AV FISTULA PLACEMENT Right 01/02/2017   Procedure: INSERTION OF ARTERIOVENOUS GORE-TEX  GRAFT  RIGHT ARM;  Surgeon: Chuck Hint, MD;  Location: [facility] OR;  Service: Vascular;  Laterality: Right;  . CAROTID ENDARTERECTOMY Left X 2  . CATARACT EXTRACTION W/ INTRAOCULAR LENS  IMPLANT, BILATERAL Bilateral   . CERVICAL BIOPSY  ~ 2015   "precancerous cells"  .  COLONOSCOPY    . EYE SURGERY Bilateral    numerous surgeries  . INSERTION OF DIALYSIS CATHETER N/A 06/24/2015   Procedure: ULTRASOUND BILATERAL INTERNAL JUGULAR VEIN INSERTION OF DIALYSIS CATHETER LEFT INTERNAL JUGULAR VEIN ;  Surgeon: Pryor Ochoa, MD;  Location: [facility] OR;  Service: Vascular;  Laterality: N/A;  . INTRAUTERINE DEVICE INSERTION  ~ 2015  . VITRECTOMY Bilateral     Social History Social History   Social History  . Marital status: Married    Spouse name: N/A  . Number of children: 0  . Years of education: N/A   Occupational History  . disabled    Social History Main Topics  . Smoking status: Former Smoker    Packs/day: 1.00    Years: 20.00    Types: Cigarettes    Quit date: 09/11/2005  . Smokeless tobacco: Never Used  . Alcohol use No  . Drug use: No  . Sexual activity: No   Other Topics Concern  . Not on file   Social History Narrative  . No narrative on file     Allergies  Allergen Reactions  . Ciprofloxacin Itching  . Epinephrine Palpitations    Family History  Problem Relation Age of Onset  . Cancer Mother        Breast, NHL  . Stroke Mother   . Peripheral vascular disease Father   . CAD Father 50  . Heart attack Father   . Hypertension Father   . Asthma Father   . Heart disease Father        before age 23      Prior to Admission medications   Medication Sig Start Date End Date Taking? Authorizing Provider  Acetaminophen (TYLENOL PO) Take 1 each by mouth daily as needed (pain). Liquid tylenol -takes a capful    [provider]  albuterol (PROVENTIL HFA;VENTOLIN HFA) 108 (90 Base) MCG/ACT inhaler Inhale 2 puffs into the lungs every 6 (six) hours as needed for wheezing or shortness of breath.    [provider]  aspirin EC 81 MG tablet Take 81 mg by mouth daily.    [provider]  docusate sodium (COLACE) 100 MG capsule Take 1 capsule (100 mg total) by mouth daily. 10/10/16   Ngetich, Dinah C, NP    guaiFENesin-dextromethorphan (ROBITUSSIN DM) 100-10 MG/5ML syrup Take 10 mLs by mouth every 6 (six) hours as needed for cough.    [provider]  hydrocerin (EUCERIN) CREA Apply 1 application topically 2 (two) times daily. 07/20/15   Dhungel, Nishant, MD  insulin glargine (LANTUS) 100 UNIT/ML injection Inject 0.4 mLs (40 Units total) into the skin 2 (two) times daily. 08/31/16   Dorothea Ogle, MD  insulin lispro (HUMALOG) 100 UNIT/ML injection Inject 0-30 Units into the skin 3 (  three) times daily before meals. Sliding Scale (also depends on what patient is eating) 100-150 = 10 units 151-200=10-15 units 201-299=20 units >300 =30 units    [provider]  levonorgestrel (MIRENA) 20 MCG/24HR IUD 1 Intra Uterine Device (1 each total) by Intrauterine route once. 08/16/15   Adolphus Birchwoodossi, Emma, MD  levothyroxine (SYNTHROID, LEVOTHROID) 88 MCG tablet Take 1 tablet (88 mcg total) by mouth daily before breakfast. 10/10/16   Ngetich, Dinah C, NP  midodrine (PROAMATINE) 10 MG tablet Take 1 tablet (10 mg total) by mouth 2 (two) times daily with a meal. Patient taking differently: Take 10 mg by mouth 3 (three) times a week. Takes 30 minutes prior to dialysis on Monday, Wednesday and Friday 07/20/15   Dhungel, Theda BelfastNishant, MD  Multiple Vitamins-Minerals (MULTIVITAMIN ADULT) CHEW Chew 1 tablet by mouth daily.    [provider]  oxyCODONE-acetaminophen (ROXICET) 5-325 MG tablet Take 1-2 tablets by mouth every 4 (four) hours as needed. 01/02/17   Chuck Hintickson, Christopher S, MD  OXYGEN 2lpm 24/7[facility]    [provider]  senna (SENOKOT) 8.6 MG tablet Take 2 tablets by mouth at bedtime as needed for constipation.     [provider]  sevelamer carbonate (RENVELA) 2.4 g PACK Take 2.4 g by mouth 3 (three) times daily with meals. 08/31/16   Dorothea OgleMyers, Iskra M, MD  sodium chloride (OCEAN) 0.65 % SOLN nasal spray Place 1 spray into both nostrils as needed for congestion.    [provider]     Physical Exam:  Vitals:   02/14/17 0948 02/14/17 1015  BP: (!) 110/57 (!) 110/52  Pulse: 94 98  Resp: 16   Temp: 98.4 F (36.9 C)   TempSrc: Oral   SpO2: 99% 100%   Constitutional: NAD, calm, ill appearing  Eyes: PERRL, lids and conjunctivae normal ENMT: Mucous membranes are moist, without exudate or lesions  Neck: normal, supple, no masses, no thyromegaly Respiratory: clear to auscultation bilaterally, no wheezing, no crackles. Normal respiratory effort  Cardiovascular: Regular rate and rhythm, no murmurs, rubs or gallops. Bilateral 2=3 + extremity edema. 2+ pedal pulses. No carotid bruits.  Abdomen: Soft, morbidly obese non tender, No hepatosplenomegaly. Bowel sounds positive.  Musculoskeletal: no clubbing / cyanosis. Moves all extremities Skin: no jaundice, venous stasis changes with hyperpigmentation bilaterally in lower extremities. LEft foot with 10 cm ulcerative wound with necrotic center and foul smelling drainage  Neurologic: Sensation intact  Strength equal in all extremities Psychiatric:   Alert and oriented x 3. Normal mood.     Labs on Admission: I have personally reviewed following labs and imaging studies  CBC:  Recent Labs Lab 02/14/17 0957  WBC 22.8*  NEUTROABS 20.7*  HGB 9.1*  HCT 28.9*  MCV 101.4*  PLT 240    Basic Metabolic Panel:  Recent Labs Lab 02/14/17 0957  NA 124*  K 5.6*  CL 88*  CO2 20*  GLUCOSE 400*  BUN 48*  CREATININE 5.63*  CALCIUM 9.2    GFR: Estimated Creatinine Clearance: 13.3 mL/min (A) (by C-G formula based on SCr of 5.63 mg/dL (H)).  Liver Function Tests:  Recent Labs Lab 02/14/17 0957  AST 25  ALT 26  ALKPHOS 303*  BILITOT 0.9  PROT 8.8*  ALBUMIN 2.6*   No results for input(s): LIPASE, AMYLASE in the last 168 hours. No results for input(s): AMMONIA in the last 168 hours.  Coagulation Profile: No results for input(s): INR, PROTIME in the last 168 hours.  Cardiac Enzymes: No results  for input(s):  CKTOTAL, CKMB, CKMBINDEX, TROPONINI in the last 168 hours.  BNP (last 3 results) No results for input(s): PROBNP in the last 8760 hours.  HbA1C: No results for input(s): HGBA1C in the last 72 hours.  CBG: No results for input(s): GLUCAP in the last 168 hours.  Lipid Profile: No results for input(s): CHOL, HDL, LDLCALC, TRIG, CHOLHDL, LDLDIRECT in the last 72 hours.  Thyroid Function Tests: No results for input(s): TSH, T4TOTAL, FREET4, T3FREE, THYROIDAB in the last 72 hours.  Anemia Panel: No results for input(s): VITAMINB12, FOLATE, FERRITIN, TIBC, IRON, RETICCTPCT in the last 72 hours.  Urine analysis:    Component Value Date/Time   COLORURINE RED (A) 07/08/2015 1705   APPEARANCEUR TURBID (A) 07/08/2015 1705   LABSPEC 1.023 07/08/2015 1705   PHURINE 5.0 07/08/2015 1705   GLUCOSEU 100 (A) 07/08/2015 1705   HGBUR MODERATE (A) 07/08/2015 1705   BILIRUBINUR MODERATE (A) 07/08/2015 1705   KETONESUR 15 (A) 07/08/2015 1705   PROTEINUR 100 (A) 07/08/2015 1705   UROBILINOGEN 1.0 07/08/2015 1705   NITRITE POSITIVE (A) 07/08/2015 1705   LEUKOCYTESUR SMALL (A) 07/08/2015 1705    Sepsis Labs: @LABRCNTIP (procalcitonin:4,lacticidven:4) )No results found for this or any previous visit (from the past 240 hour(s)).   Radiological Exams on Admission: No results found.  EKG: Independently reviewed.  Assessment/Plan Active Problems:   Cellulitis of leg   DM (diabetes mellitus), type 2 with renal complications (HCC)   Hyperlipidemia associated with type 2 diabetes mellitus (HCC)   Hypertensive heart disease   CAD (coronary artery disease), native coronary artery   COPD (chronic obstructive pulmonary disease) (HCC)   Chronic diastolic CHF (congestive heart failure) (HCC)   Chronic respiratory failure with hypoxia (HCC)   On home oxygen therapy   Hypothyroidism   Morbid obesity (HCC)   Obesity hypoventilation syndrome (HCC)   Paroxysmal atrial fibrillation (HCC)   Left foot   Cellulitis  In the setting of chronic venous disease and  MRSA carrier.  WBC 22 Afebrile.  Initial lactic acid elevated at 3.24->1.97  . Anion gap 16 Left foot wound non healing, with odorous drainage . L XR  Foot pending . IV Vanc and Zosyn pending . BCx pending .   Admit to SDU inpatient  Cellulitis order set X ray Continue broad spectrum antibiotics with vancomycin and Zosyn Blood cultures; check urine culture CHeck serial serum lactate   Procalcitonin Orthopedic to see, awaiting consult rule out need for TMA    ESRD on HD MWF  Cr 5.63 , BL 4  Primary Nephrologist Dr. Arrie Aran  Hyponatremia with Na 124 , K 5.6  Nephrology involved  Renal Diet. Other plans regarding metabolic abnormalities as per Nephrology Check CMET in am   Hypotension  BP  110/52   Pulse 98   Controlled, continue Midrodine    Type II Diabetes Current blood sugar level is 400 A1C 6.6  Lab Results  Component Value Date   HGBA1C 6.6 10/06/2016  Lantus , SSI   Chronic diastolic heart failure,TTE (07/09/15) with EF 55-60%, mild concentric hypertrophy, grade 2 diastolic dysfunction, mild MS, and severe pulmonary HTN current weight  266. Appears compensated. EKG sinus rhythm, left bundle branch block, no acute changes from prior. CXR  Pending   Weight 266    Careful use of IVF if needed, currently not on IVF as she is due for HD . Denies increased shortness of breath   Daily weights, strict I/O CXR on admission  Chronic respiratory failure with hypoxia on O2 at home in the setting of  Obesity hypoventilation syndrome , COPD without exacerbation Osats normal on 2 L O2  CXR pending  WBC 22    Continue nebs and O2 prn      Hypothyroidism: Continue home Synthroid   Type II Diabetes Current blood sugar level is 400 A1C 6.6  Lab Results  Component Value Date   HGBA1C 6.6 10/06/2016  Lantus , SSI    DVT prophylaxis:  Heparin   Code Status:   Full    Family Communication:  Discussed with patient and  husband  Disposition Plan: Expect patient to be discharged to home after condition improves Consults called: Nephrology and Orthopedics by EDP      Admission status:  SDU    [facility] E, PA-C [facility]   02/14/2017, 11:08 AM

## 2017-02-14 NOTE — ED Provider Notes (Signed)
[facility]-EMERGENCY DEPT Provider Note   CSN: [id number] Arrival date & time: 02/14/17  0941     History   Chief Complaint Chief Complaint  Patient presents with  . foot wound    HPI Kimberly [name] is a 61 y.o. female.  HPI Patient has significantly worsening foot wound over the past 2 days. She was being treated by wound care clinic and Dr. Roxan Hockey. Since yesterday wound has advanced significantly with erythema and drainage. She is referred to come directly to the emergency department. Patient normally has dialysis today. She did not go to dialysis due to being recommended to come directly to the emergency department. No fever that she is aware. No significant general constitutional symptoms. She reports chronically she has problems with shortness of breath and chest pain due to immobility and underlying medical problems. Past Medical History:  Diagnosis Date  . Anemia   . Anxiety   . Arthritis    "knees, feet, hands" (08/23/2016)  . Asthma   . CAD, NATIVE VESSEL    May 10, 2010 cath showed a hyperdynamic LV function, she had dominant circumflex anatomy with a 70-80% small OM1. She had diffuse diabetic plaque particularly in the distal LAD. She nondominant RCA.  Nondominant  . Cellulitis 10/15/2013  . CHF (congestive heart failure) (HCC)    Preserved EF  . Chronic bronchitis (HCC)    "probably once/ yr" (08/23/2016)  . Complication of anesthesia    "I've had difficulty waking up" (08/23/2016)  . COPD   . Depression   . Diabetic neuropathy (HCC)   . Dyspnea   . Endometrial hyperplasia   . ESRD (end stage renal disease) on dialysis (HCC)    "[facility]; MWF; [place]" (08/23/2016)  . Family history of adverse reaction to anesthesia    mother had hard time waking up  . GERD (gastroesophageal reflux disease)   . History of hiatal hernia   . HYPERLIPIDEMIA   . HYPERTENSION   . Hypothyroidism   . Myocardial infarct, old   . On home oxygen therapy    "2L; 24/7" (08/23/2016)   . Paralyzed vocal cords   . Peripheral neuropathy    hx/notes 01/27/2010  . Pneumonia "several times"  . Proliferative retinopathy    hx/notes 01/27/2010  . PVD    CEA  . Restless leg syndrome    "mostly on the right" (08/23/2016)  . 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 Type II diabetes mellitus [facility])     Patient Active Problem List   Diagnosis Date Noted  . Diabetes mellitus, type II, insulin dependent (HCC)   . Pressure injury of skin 08/29/2016  . Cellulitis of leg 08/29/2016  . Chest pain 08/28/2016  . Elevated troponin 08/28/2016  . 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 Hyperkalemia 08/23/2016  . Shortness of breath 08/23/2016  . Hyponatremia 08/23/2016  . Hypotension arterial 09/30/2015  . LBBB (left bundle branch block) 09/30/2015  . Complex endometrial hyperplasia with atypia 09/02/2015  . Severe obesity (HCC) 07/29/2015  . Hypotension 07/20/2015  . Uncontrolled type 2 diabetes mellitus (HCC) 07/20/2015  . Vaginal bleeding   . Altered mental status   . Pulmonary arterial hypertension (HCC)   . Paroxysmal atrial fibrillation (HCC) 07/11/2015  . ESRD (end stage renal disease) (HCC) 07/08/2015  . Diabetic ulcer of right foot associated with diabetes mellitus due to underlying condition (HCC)   .  Pressure ulcer 04/07/2015  . Obesity hypoventilation syndrome (HCC) 08/05/2014  . Morbid obesity (HCC) 06/28/2014  . Physical deconditioning 06/28/2014  . Hypothyroidism 06/10/2014  . Carotid stenosis 12/02/2013  . Varicose veins of lower extremities with other complications 12/02/2013  . Chronic respiratory failure with hypoxia (HCC) 10/13/2013  . On home oxygen therapy 10/13/2013  . Chronic diastolic CHF (congestive heart failure) (HCC) 09/12/2013  . CAD (coronary artery disease), native coronary artery   . DM (diabetes mellitus), type 2 with renal complications  (HCC) 05/31/2010  . Hyperlipidemia associated with type 2 diabetes mellitus (HCC) 05/31/2010  . COPD (chronic obstructive pulmonary disease) (HCC) 05/31/2010  . Hypertensive heart disease     Past Surgical History:  Procedure Laterality Date  . AV FISTULA PLACEMENT Right 07/08/2015   Procedure: EXPLORATION RIGHT AXILLARY ARTERY AND RIGHT BRACHIAL VEIN;  Surgeon: Chuck Hinthristopher S Dickson, MD;  [facility];  Service: Vascular;  Laterality: Right;  . AV FISTULA PLACEMENT Right 01/02/2017   Procedure: INSERTION OF ARTERIOVENOUS GORE-TEX  GRAFT  RIGHT ARM;  Surgeon: Chuck Hinthristopher S Dickson, MD;  [facility];  Service: Vascular;  Laterality: Right;  . CAROTID ENDARTERECTOMY Left X 2  . CATARACT EXTRACTION W/ INTRAOCULAR LENS  IMPLANT, BILATERAL Bilateral   . CERVICAL BIOPSY  ~ 2015   "precancerous cells"  . COLONOSCOPY    . EYE SURGERY Bilateral    numerous surgeries  . INSERTION OF DIALYSIS CATHETER N/A 06/24/2015   Procedure: ULTRASOUND BILATERAL INTERNAL JUGULAR VEIN INSERTION OF DIALYSIS CATHETER LEFT INTERNAL JUGULAR VEIN ;  Surgeon: Pryor OchoaJames D Lawson, MD;  [facility];  Service: Vascular;  Laterality: N/A;  . INTRAUTERINE DEVICE INSERTION  ~ 2015  . VITRECTOMY Bilateral     OB History    No data available       Home Medications    Prior to Admission medications   Medication Sig Start Date End Date Taking? Authorizing Provider  Acetaminophen (TYLENOL PO) Take 1 each by mouth daily as needed (pain). Liquid tylenol -takes a capful    [provider]  albuterol (PROVENTIL HFA;VENTOLIN HFA) 108 (90 Base) MCG/ACT inhaler Inhale 2 puffs into the lungs every 6 (six) hours as needed for wheezing or shortness of breath.    [provider]  aspirin EC 81 MG tablet Take 81 mg by mouth daily.    [provider]  docusate sodium (COLACE) 100 MG capsule Take 1 capsule (100 mg total) by mouth daily. 10/10/16   Ngetich, Dinah C, NP  guaiFENesin-dextromethorphan  (ROBITUSSIN DM) 100-10 MG/5ML syrup Take 10 mLs by mouth every 6 (six) hours as needed for cough.    [provider]  hydrocerin (EUCERIN) CREA Apply 1 application topically 2 (two) times daily. 07/20/15   Dhungel, Nishant, MD  insulin glargine (LANTUS) 100 UNIT/ML injection Inject 0.4 mLs (40 Units total) into the skin 2 (two) times daily. 08/31/16   Dorothea OgleMyers, Iskra M, MD  insulin lispro (HUMALOG) 100 UNIT/ML injection Inject 0-30 Units into the skin 3 (three) times daily before meals. Sliding Scale (also depends on what patient is eating) 100-150 = 10 units 151-200=10-15 units 201-299=20 units >300 =30 units    [provider]  levonorgestrel (MIRENA) 20 MCG/24HR IUD 1 Intra Uterine Device (1 each total) by Intrauterine route once. 08/16/15   Adolphus Birchwoodossi, Emma, MD  levothyroxine (SYNTHROID, LEVOTHROID) 88 MCG tablet Take 1 tablet (88 mcg total) by mouth daily before breakfast. 10/10/16   Ngetich, Dinah C, NP  midodrine (PROAMATINE) 10 MG  tablet Take 1 tablet (10 mg total) by mouth 2 (two) times daily with a meal. Patient taking differently: Take 10 mg by mouth 3 (three) times a week. Takes 30 minutes prior to dialysis on Monday, Wednesday and Friday 07/20/15   Dhungel, Theda Belfast, MD  Multiple Vitamins-Minerals (MULTIVITAMIN ADULT) CHEW Chew 1 tablet by mouth daily.    [provider]  oxyCODONE-acetaminophen (ROXICET) 5-325 MG tablet Take 1-2 tablets by mouth every 4 (four) hours as needed. 01/02/17   Chuck Hint, MD  OXYGEN 2lpm 24/[facility]    [provider]  senna (SENOKOT) 8.6 MG tablet Take 2 tablets by mouth at bedtime as needed for constipation.     [provider]  sevelamer carbonate (RENVELA) 2.4 g PACK Take 2.4 g by mouth 3 (three) times daily with meals. 08/31/16   Dorothea Ogle, MD  sodium chloride (OCEAN) 0.65 % SOLN nasal spray Place 1 spray into both nostrils as needed for congestion.    [provider]    Family History Family  History  Problem Relation Age of Onset  . Cancer Mother        Breast, NHL  . Stroke Mother   . Peripheral vascular disease Father   . CAD Father 43  . Heart attack Father   . Hypertension Father   . Asthma Father   . Heart disease Father        before age 23    Social History Social History  Substance Use Topics  . Smoking status: Former Smoker    Packs/day: 1.00    Years: 20.00    Types: Cigarettes    Quit date: 09/11/2005  . Smokeless tobacco: Never Used  . Alcohol use No     Allergies   Ciprofloxacin and Epinephrine   Review of Systems Review of Systems 10 Systems reviewed and are negative for acute change except as noted in the HPI.   Physical Exam Updated Vital Signs BP (!) 101/51   Pulse 95   Temp 98.4 F (36.9 C) (Oral)   Resp 19   SpO2 100%   Physical Exam  Constitutional: She is oriented to person, place, and time.  Alert and nontoxic. No respiratory distress. Morbid obesity.  HENT:  Head: Normocephalic and atraumatic.  Mouth/Throat: Oropharynx is clear and moist.  Eyes: EOM are normal.  Cardiovascular: Normal rate, regular rhythm, normal heart sounds and intact distal pulses.   Pulmonary/Chest: Effort normal and breath sounds normal.  Abdominal: Soft.  Diffuse erythema of the lower pannus. No abdominal tenderness.  Musculoskeletal:  Bilateral 2-3+ pitting edema. Significant skin changes that are chronic in appearance with cobblestoning and subcutaneous edema and hyperpigmentation bilaterally. Left lower extremity has diffuse erythema that is blanching. Foot is swollen 3+. Sole of foot has an approximately 10 x 10 cm ulcerative wound that has foul-smelling drainage.  Neurological: She is alert and oriented to person, place, and time. She exhibits normal muscle tone. Coordination normal.  Skin: Skin is warm and dry.  Psychiatric: She has a normal mood and affect.     ED Treatments / Results  Labs (all labs ordered are listed, but only abnormal  results are displayed) Labs Reviewed  COMPREHENSIVE METABOLIC PANEL - Abnormal; Notable for the following:       Result Value   Sodium 124 (*)    Potassium 5.6 (*)    Chloride 88 (*)    CO2 20 (*)    Glucose, Bld 400 (*)    BUN  48 (*)    Creatinine, Ser 5.63 (*)    Total Protein 8.8 (*)    Albumin 2.6 (*)    Alkaline Phosphatase 303 (*)    GFR calc non Af Amer 7 (*)    GFR calc Af Amer 8 (*)    Anion gap 16 (*)    All other components within normal limits  CBC WITH DIFFERENTIAL/PLATELET - Abnormal; Notable for the following:    WBC 22.8 (*)    RBC 2.85 (*)    Hemoglobin 9.1 (*)    HCT 28.9 (*)    MCV 101.4 (*)    RDW 16.2 (*)    Neutro Abs 20.7 (*)    All other components within normal limits  I-STAT CG4 LACTIC ACID, ED - Abnormal; Notable for the following:    Lactic Acid, Venous 3.24 (*)    All other components within normal limits  CULTURE, BLOOD (ROUTINE X 2)  CULTURE, BLOOD (ROUTINE X 2)  URINALYSIS, ROUTINE W REFLEX MICROSCOPIC  I-STAT CG4 LACTIC ACID, ED    EKG  EKG Interpretation None       Radiology No results found.  Procedures Procedures (including critical care time)  Medications Ordered in ED Medications  piperacillin-tazobactam (ZOSYN) IVPB 3.375 g (3.375 g Intravenous New Bag/Given 02/14/17 1059)  vancomycin (VANCOCIN) 2,000 mg in sodium chloride 0.9 % 500 mL IVPB (not administered)  piperacillin-tazobactam (ZOSYN) IVPB 3.375 g (not administered)  vancomycin (VANCOCIN) IVPB 1000 mg/200 mL premix (not administered)     Initial Impression / Assessment and Plan / ED Course  I have reviewed the triage vital signs and the nursing notes.  Pertinent labs & imaging results that were available during my care of the patient were reviewed by me and considered in my medical decision making (see chart for details).    Consult hospitalist for admission Consult Dr. Lajoyce Corners (11:20) for orthopedics. Recommends ABI will see in consult.  Final Clinical  Impressions(s) / ED Diagnoses   Final diagnoses:  Skin ulcer of left foot with necrosis of muscle (HCC)  Severe comorbid illness  Cellulitis of left lower extremity  Patient has rapidly worsening foot wound. She has severe comorbid illness. Patient has leukocytosis and elevated lactic acidosis. She is end-stage renal failure on dialysis. Patient is due for dialysis today. She however does not have dyspnea or signs of decompensation from acute volume overload. She is at this time alert and nontoxic. Plan for admission and consultation has been placed to orthopedics. Consultation to nephrology pending. Patient is admitted to hospitalist service.  New Prescriptions New Prescriptions   No medications on file     Arby Barrette, MD 02/14/17 1129

## 2017-02-14 NOTE — ED Notes (Signed)
Admitting PA at bedside.

## 2017-02-14 NOTE — Progress Notes (Signed)
Pharmacy Antibiotic Note  Galen ManilaSusan K Windt is a 61 y.o. female admitted on 02/14/2017 with cellulitis.  Pharmacy has been consulted for vancomycin and zosyn dosing. Pt is afebrile but WBC is elevated at 22.8. Pt is ESRD and due for HD today. Lactic acid is elevated.   Plan: Vancomycin 2gm IV x 1 then 1gm post-HD Zosyn 3.375gm IV Q12H (4 hr inf) F/u renal plans, C&S, clinical status and pre-HD vanc level when appropriate     Temp (24hrs), Avg:98.4 F (36.9 C), Min:98.4 F (36.9 C), Max:98.4 F (36.9 C)   Recent Labs Lab 02/14/17 0957 02/14/17 1009  WBC 22.8*  --   CREATININE 5.63*  --   LATICACIDVEN  --  3.24*    Estimated Creatinine Clearance: 13.3 mL/min (A) (by C-G formula based on SCr of 5.63 mg/dL (H)).    Allergies  Allergen Reactions  . Ciprofloxacin Itching  . Epinephrine Palpitations    Antimicrobials this admission: Vanc 6/6>> Zosyn 6/6>>  Dose adjustments this admission: N/A  Microbiology results: Pending  Thank you for allowing pharmacy to be a part of this patient's care.  Martrell Eguia, Drake LeachRachel Lynn 02/14/2017 10:54 AM

## 2017-02-14 NOTE — Consult Note (Signed)
Kimberly [name] Admit Date: 02/14/2017 02/14/2017 Kimberly [name] Requesting Physician:  Marily Memos MD  Reason for Consult:  Comanagement of ESRD HPI:  61 year old female admitted today with progressive malodorous draining and erythematous left foot wound. We are asked to assist with management of ESRD  PMH Incudes:  Recent progressive left foot heel wound followed by wound center  Type 2 diabetes with chart history of peripheral neuropathy  Atrial fibrillation  History of CVA status post carotid endarterectomy  CAD  Diastolic heart failure  Hyperlipidemia  COPD  Patient receives dialysis at [facility] last treatment 6/4.    She presented to the ER today with the above issues, specifically noticing the onset of a new ulcer no longer on the heel of the left foot but over the ball of foot.  She was found to have a white count 22.8. She has chronic hypotension, it does not appear significantly worse compared to her outpatient hemodialysis records. She has not had fever. She has been admitted to the hospitalist service and is receiving vancomycin and Zosyn with orthopedics consult to follow.    Labs are reviewed in starting a sodium of 124 with a glucose of 400, potassium 5.6, bicarbonate 20, BUN 48, creatinine 5.6. Hemoglobin is 9.1. Abdomen is 2.6.  ROS Balance of 12 systems is negative w/ exceptions as above  PMH  Past Medical History:  Diagnosis Date  . Anemia   . Anxiety   . Arthritis    "knees, feet, hands" (08/23/2016)  . Asthma   . CAD, NATIVE VESSEL    May 10, 2010 cath showed a hyperdynamic LV function, she had dominant circumflex anatomy with a 70-80% small OM1. She had diffuse diabetic plaque particularly in the distal LAD. She nondominant RCA.  Nondominant  . Cellulitis 10/15/2013  . CHF (congestive heart failure) (HCC)    Preserved EF  . Chronic bronchitis ([place])    "probably once/ yr" (08/23/2016)  . Complication of anesthesia    "I've had  difficulty waking up" (08/23/2016)  . COPD   . Depression   . Diabetic neuropathy ([place])   . Dyspnea   . Endometrial hyperplasia   . ESRD (end stage renal disease) on dialysis ([place])    "[facility]; MWF; [place]" (08/23/2016)  . Family history of adverse reaction to anesthesia    mother had hard time waking up  . GERD (gastroesophageal reflux disease)   . History of hiatal hernia   . HYPERLIPIDEMIA   . HYPERTENSION   . Hypothyroidism   . Myocardial infarct, old   . On home oxygen therapy    "2L; 24/7" (08/23/2016)  . Paralyzed vocal cords   . Peripheral neuropathy    hx/notes 01/27/2010  . Pneumonia "several times"  . Proliferative retinopathy    hx/notes 01/27/2010  . PVD    CEA  . Restless leg syndrome    "mostly on the right" (08/23/2016)  . 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 Type II diabetes mellitus (HCC)    PSH  Past Surgical History:  Procedure Laterality Date  . AV FISTULA PLACEMENT Right 07/08/2015   Procedure: EXPLORATION RIGHT AXILLARY ARTERY AND RIGHT BRACHIAL VEIN;  Surgeon: Angelia Mould, MD;  Location: [place];  Service: Vascular;  Laterality: Right;  . AV FISTULA PLACEMENT Right 01/02/2017   Procedure: INSERTION OF ARTERIOVENOUS GORE-TEX  GRAFT  RIGHT ARM;  Surgeon: Angelia Mould, MD;  Location: [place];  Service: Vascular;  Laterality: Right;  . CAROTID ENDARTERECTOMY Left X 2  . CATARACT EXTRACTION W/ INTRAOCULAR LENS  IMPLANT, BILATERAL Bilateral   . CERVICAL BIOPSY  ~ 2015   "precancerous cells"  . COLONOSCOPY    . EYE SURGERY Bilateral    numerous surgeries  . INSERTION OF DIALYSIS CATHETER N/A 06/24/2015   Procedure: ULTRASOUND BILATERAL INTERNAL JUGULAR VEIN INSERTION OF DIALYSIS CATHETER LEFT INTERNAL JUGULAR VEIN ;  Surgeon: Mal Misty, MD;  Location: [place];  Service: Vascular;  Laterality: N/A;  . INTRAUTERINE  DEVICE INSERTION  ~ 2015  . VITRECTOMY Bilateral    FH  Family History  Problem Relation Age of Onset  . Cancer Mother        Breast, NHL  . Stroke Mother   . Peripheral vascular disease Father   . CAD Father 60  . Heart attack Father   . Hypertension Father   . Asthma Father   . Heart disease Father        before age 42     reports that she quit smoking about 11 years ago. Her smoking use included Cigarettes. She has a 20.00 pack-year smoking history. She has never used smokeless tobacco. She reports that she does not drink alcohol or use drugs. Allergies  Allergies  Allergen Reactions  . Ciprofloxacin Itching  . Epinephrine Palpitations   Home medications Prior to Admission medications   Medication Sig Start Date End Date Taking? Authorizing Provider  albuterol (PROVENTIL HFA;VENTOLIN HFA) 108 (90 Base) MCG/ACT inhaler Inhale 2 puffs into the lungs every 6 (six) hours as needed for wheezing or shortness of breath.   Yes [provider]  aspirin EC 81 MG tablet Take 81 mg by mouth daily.   Yes [provider]  hydrocerin (EUCERIN) CREA Apply 1 application topically 2 (two) times daily. 07/20/15  Yes Dhungel, Nishant, MD  insulin glargine (LANTUS) 100 UNIT/ML injection Inject 0.4 mLs (40 Units total) into the skin 2 (two) times daily. 08/31/16  Yes Theodis Blaze, MD  insulin lispro (HUMALOG) 100 UNIT/ML injection Inject 0-30 Units into the skin 3 (three) times daily before meals. Sliding Scale (also depends on what patient is eating) 100-150 = 10 units 151-200=10-15 units 201-299=20 units >300 =30 units   Yes [provider]  levothyroxine (SYNTHROID, LEVOTHROID) 88 MCG tablet Take 1 tablet (88 mcg total) by mouth daily before breakfast. 10/10/16  Yes Ngetich, Dinah C, NP  midodrine (PROAMATINE) 10 MG tablet Take 1 tablet (10 mg total) by mouth 2 (two) times daily with a meal. Patient taking differently: Take 10 mg by mouth 3 (three) times a week.  Takes 30 minutes prior to dialysis on Monday, Wednesday and Friday 07/20/15  Yes Dhungel, Nishant, MD  Multiple Vitamins-Minerals (MULTIVITAMIN ADULT) CHEW Chew 1 tablet by mouth daily.   Yes [provider]  OXYGEN 2lpm 24/7- [facility]   Yes [provider]  docusate sodium (COLACE) 100 MG capsule Take 1 capsule (100 mg total) by mouth daily. Patient taking differently: Take 100 mg by mouth daily as needed for mild constipation.  10/10/16   Ngetich, Dinah C, NP  levonorgestrel (MIRENA) 20 MCG/24HR IUD 1 Intra Uterine Device (1 each total) by Intrauterine route once. 08/16/15   Everitt Amber, MD  oxyCODONE-acetaminophen (ROXICET) 5-325 MG tablet Take 1-2 tablets by mouth every 4 (four) hours as needed. Patient not taking: Reported on 02/14/2017 01/02/17  Angelia Mould, MD  sevelamer carbonate (RENVELA) 2.4 g PACK Take 2.4 g by mouth 3 (three) times daily with meals. Patient not taking: Reported on 02/14/2017 08/31/16   Theodis Blaze, MD    Current Medications Scheduled Meds: . [START ON 02/15/2017] aspirin EC  81 mg Oral Daily  . [START ON 02/15/2017] docusate sodium  100 mg Oral Daily  . heparin  5,000 Units Subcutaneous Q8H  . insulin aspart  0-9 Units Subcutaneous TID WC  . insulin glargine  20 Units Subcutaneous BID  . [START ON 02/15/2017] levothyroxine  88 mcg Oral QAC breakfast  . midodrine  10 mg Oral Once per day on Mon Wed Fri  . [START ON 02/15/2017] sevelamer carbonate  2.4 g Oral TID WC   Continuous Infusions: . piperacillin-tazobactam (ZOSYN)  IV    . [START ON 02/16/2017] vancomycin     PRN Meds:.acetaminophen **OR** acetaminophen, albuterol, ondansetron **OR** ondansetron (ZOFRAN) IV, oxyCODONE-acetaminophen, senna  CBC  Recent Labs Lab 02/14/17 0957  WBC 22.8*  NEUTROABS 20.7*  HGB 9.1*  HCT 28.9*  MCV 101.4*  PLT 628   Basic Metabolic Panel  Recent Labs Lab 02/14/17 0957  NA 124*  K 5.6*  CL 88*  CO2 20*  GLUCOSE 400*  BUN 48*  CREATININE 5.63*   CALCIUM 9.2    Physical Exam  Blood pressure (!) 96/50, pulse 94, temperature 98.4 F (36.9 C), temperature source Oral, resp. rate (!) 25, SpO2 100 %. GEN: Obese, chronically ill-appearing ENT: NCAT, left CEA scar present EYES: EOMI, wearing glasses CV: RRR PULM: CTAB, anterior, normal work of breathing, speaks in full sentences ABD: Soft, nontender, obese SKIN: Both legs with chronic venous stasis changes to the knees scaling and thickening of skin, left foot with increased erythema compared to the right. On the ball of the left foot that area of bruising, 2 cm ulcer EXT: As above, right upper extremity AV graft with bruit  Outpt HD Orders Unit: [place] Days: MWF Time: 4.5h Dialyzer: F180 EDW: 113.5kg K/Ca: 2K 2.25 Ca Access: AVG cannulation in progress Needle Size: progressing UF Proflie: 2 VDRA: none EPO: Miercera 61mg q2wk, last 5/30 IV Fe: Currently has rec 3/5 Venofer [id number] Heparin: 6k IVB pre Tx, 5K IVB mid run Midodrine used preHD  Assessment 57F ESRD SGAmmonith progressive L foot wound  1. ESRD SGCosmosWF sig IDH on midodrine 2. Nonhealing L foot ulcer with cellulitis on Vanc/Zosn per ortho and TRH 3. Hyperkalemia, mild 4. Hyponatremia, mild, partially related to hypergylcemia + likely excess free water intake 5. Leukocytosis 2/2 #2 6. CAD, AFib, dCHF 7. Anemia next ESA due 6/13, mircera [id number]; hold IV Fe with active infection  Plan 1. HD today, 2K bath Na 135, 2-3L UF, AVG, Use heparin 2. [facility]    RyaPearson Grippe [phone] 02/14/2017, 1:50 PM

## 2017-02-15 ENCOUNTER — Inpatient Hospital Stay (HOSPITAL_COMMUNITY): Payer: Medicare Other

## 2017-02-15 ENCOUNTER — Encounter (HOSPITAL_BASED_OUTPATIENT_CLINIC_OR_DEPARTMENT_OTHER): Payer: Medicare Other

## 2017-02-15 ENCOUNTER — Encounter (HOSPITAL_COMMUNITY): Payer: Self-pay | Admitting: Interventional Radiology

## 2017-02-15 ENCOUNTER — Other Ambulatory Visit (INDEPENDENT_AMBULATORY_CARE_PROVIDER_SITE_OTHER): Payer: Self-pay | Admitting: Family

## 2017-02-15 DIAGNOSIS — E118 Type 2 diabetes mellitus with unspecified complications: Secondary | ICD-10-CM

## 2017-02-15 DIAGNOSIS — I509 Heart failure, unspecified: Secondary | ICD-10-CM

## 2017-02-15 DIAGNOSIS — I70269 Atherosclerosis of native arteries of extremities with gangrene, unspecified extremity: Secondary | ICD-10-CM

## 2017-02-15 DIAGNOSIS — L97523 Non-pressure chronic ulcer of other part of left foot with necrosis of muscle: Secondary | ICD-10-CM

## 2017-02-15 DIAGNOSIS — I96 Gangrene, not elsewhere classified: Secondary | ICD-10-CM

## 2017-02-15 HISTORY — PX: IR REMOVAL TUN CV CATH W/O FL: IMG2289

## 2017-02-15 LAB — COMPREHENSIVE METABOLIC PANEL
ALK PHOS: 306 U/L — AB (ref 38–126)
ALT: 23 U/L (ref 14–54)
ANION GAP: 16 — AB (ref 5–15)
AST: 26 U/L (ref 15–41)
Albumin: 2.2 g/dL — ABNORMAL LOW (ref 3.5–5.0)
BILIRUBIN TOTAL: 1.4 mg/dL — AB (ref 0.3–1.2)
BUN: 30 mg/dL — ABNORMAL HIGH (ref 6–20)
CALCIUM: 8.6 mg/dL — AB (ref 8.9–10.3)
CO2: 24 mmol/L (ref 22–32)
Chloride: 90 mmol/L — ABNORMAL LOW (ref 101–111)
Creatinine, Ser: 4.28 mg/dL — ABNORMAL HIGH (ref 0.44–1.00)
GFR calc non Af Amer: 10 mL/min — ABNORMAL LOW (ref >60)
GFR, EST AFRICAN AMERICAN: 12 mL/min — AB (ref >60)
Glucose, Bld: 163 mg/dL — ABNORMAL HIGH (ref 65–99)
Potassium: 4.9 mmol/L (ref 3.5–5.1)
SODIUM: 130 mmol/L — AB (ref 135–145)
TOTAL PROTEIN: 7.7 g/dL (ref 6.5–8.1)

## 2017-02-15 LAB — CBC
HCT: 25.1 % — ABNORMAL LOW (ref 36.0–46.0)
HEMOGLOBIN: 7.6 g/dL — AB (ref 12.0–15.0)
MCH: 30.6 pg (ref 26.0–34.0)
MCHC: 30.3 g/dL (ref 30.0–36.0)
MCV: 101.2 fL — ABNORMAL HIGH (ref 78.0–100.0)
Platelets: 215 10*3/uL (ref 150–400)
RBC: 2.48 MIL/uL — AB (ref 3.87–5.11)
RDW: 16.3 % — ABNORMAL HIGH (ref 11.5–15.5)
WBC: 17.9 10*3/uL — ABNORMAL HIGH (ref 4.0–10.5)

## 2017-02-15 LAB — BLOOD CULTURE ID PANEL (REFLEXED)
ACINETOBACTER BAUMANNII: NOT DETECTED
CANDIDA ALBICANS: NOT DETECTED
Candida glabrata: NOT DETECTED
Candida krusei: NOT DETECTED
Candida parapsilosis: NOT DETECTED
Candida tropicalis: NOT DETECTED
Carbapenem resistance: NOT DETECTED
ENTEROBACTER CLOACAE COMPLEX: NOT DETECTED
ENTEROBACTERIACEAE SPECIES: DETECTED — AB
Enterococcus species: NOT DETECTED
Escherichia coli: NOT DETECTED
Haemophilus influenzae: NOT DETECTED
Klebsiella oxytoca: NOT DETECTED
Klebsiella pneumoniae: NOT DETECTED
Listeria monocytogenes: NOT DETECTED
NEISSERIA MENINGITIDIS: NOT DETECTED
PROTEUS SPECIES: NOT DETECTED
PSEUDOMONAS AERUGINOSA: NOT DETECTED
STREPTOCOCCUS AGALACTIAE: NOT DETECTED
STREPTOCOCCUS SPECIES: NOT DETECTED
Serratia marcescens: NOT DETECTED
Staphylococcus aureus (BCID): NOT DETECTED
Staphylococcus species: NOT DETECTED
Streptococcus pneumoniae: NOT DETECTED
Streptococcus pyogenes: NOT DETECTED

## 2017-02-15 LAB — GLUCOSE, CAPILLARY
Glucose-Capillary: 150 mg/dL — ABNORMAL HIGH (ref 65–99)
Glucose-Capillary: 204 mg/dL — ABNORMAL HIGH (ref 65–99)
Glucose-Capillary: 198 mg/dL — ABNORMAL HIGH (ref 65–99)
Glucose-Capillary: 188 mg/dL — ABNORMAL HIGH (ref 65–99)

## 2017-02-15 LAB — PROTIME-INR
INR: 1.33
Prothrombin Time: 16.6 seconds — ABNORMAL HIGH (ref 11.4–15.2)

## 2017-02-15 MED ORDER — VANCOMYCIN HCL IN DEXTROSE 1-5 GM/200ML-% IV SOLN: 1000.0000 mg | INTRAVENOUS | Status: DC

## 2017-02-15 MED ORDER — CHLORHEXIDINE GLUCONATE 4 % EX LIQD
60.0000 mL | Freq: Once | CUTANEOUS | Status: AC
  Administered 2017-02-16: 4 via TOPICAL
  Filled 2017-02-15 (×2): qty 60

## 2017-02-15 MED ORDER — LIDOCAINE HCL 1 % IJ SOLN
INTRAMUSCULAR | Status: AC
  Filled 2017-02-15: qty 20

## 2017-02-15 MED ORDER — LIDOCAINE HCL 1 % IJ SOLN
INTRAMUSCULAR | Status: DC | PRN
  Administered 2017-02-15: 10 mL

## 2017-02-15 MED ORDER — CHLORHEXIDINE GLUCONATE 4 % EX LIQD
CUTANEOUS | Status: AC
  Filled 2017-02-15: qty 15

## 2017-02-15 MED ORDER — LIDOCAINE 1% INJECTION FOR CIRCUMCISION
5.0000 mL | INJECTION | Freq: Once | INTRAVENOUS | Status: DC
  Filled 2017-02-15: qty 5

## 2017-02-15 MED ORDER — SODIUM CHLORIDE 0.9 % IV SOLN: Freq: Once | INTRAVENOUS | Status: DC

## 2017-02-15 MED ORDER — DIPHENHYDRAMINE HCL 25 MG PO CAPS
25.0000 mg | ORAL_CAPSULE | Freq: Four times a day (QID) | ORAL | Status: DC | PRN
  Administered 2017-02-16 – 2017-02-17 (×2): 25 mg via ORAL
  Filled 2017-02-15 (×2): qty 1

## 2017-02-15 NOTE — Procedures (Signed)
Successful removal of tunneled L IJ HD catheter EBL: None No immediate complications.  Katherina RightJay Aeden Matranga, MD Pager #: [phone]

## 2017-02-15 NOTE — Evaluation (Signed)
Physical Therapy Evaluation Patient Details Name: Galen ManilaSusan K Fontanella MRN: [id number] DOB: Dec 21, 1955 Today's Date: 02/15/2017   History of Present Illness  Patient is a 61 y/o female who presents with gangrene of left foot. Planned for transtibial amputation tomorrow. PMH includes morbid obesity, DM, PVD, diabetic neuropathy, depression, stroke, chronic diastolic HF, COPD, HTN, CAD.  Clinical Impression  Patient presents with pain, generalized weakness, fear of falling, hypotension and impaired mobility s/p above. Pt requiring assist for ADLs PTA and using RW for household ambulation. Pt recently d/ced from SNF in January. Pt declined transferring to chair today. Tolerated standing and taking a few steps along side bed with Min A of 2 for balance/safety. Pt emotionally labile during session. Will not have support at home as husband works. PLan for amputation tomorrow and pt will need ST SNF to maximize independence and mobility prior to return home. Will follow acutely.     Follow Up Recommendations SNF;Supervision for mobility/OOB;Supervision/Assistance - 24 hour    Equipment Recommendations  None recommended by PT    Recommendations for Other Services       Precautions / Restrictions Precautions Precautions: Fall Precaution Comments: watch BP Restrictions Weight Bearing Restrictions: No      Mobility  Bed Mobility Overal bed mobility: Needs Assistance Bed Mobility: Rolling;Sidelying to Sit;Sit to Supine Rolling: Mod assist;+2 for physical assistance Sidelying to sit: Mod assist;+2 for physical assistance;HOB elevated   Sit to supine: Mod assist;+2 for physical assistance;HOB elevated   General bed mobility comments: Step by step cues for technique and log roll technique. Use of rail for support. Assist to bring LEs off bed, and to elevate trunk. Assist to bring LEs to return to supine.   Transfers Overall transfer level: Needs assistance Equipment used: Rolling walker (2  wheeled) Transfers: Sit to/from Stand Sit to Stand: Min assist;+2 physical assistance;From elevated surface         General transfer comment: Assist to power to standing with use of momentum.   Ambulation/Gait Ambulation/Gait assistance: Min assist;+2 physical assistance Ambulation Distance (Feet): 3 Feet Assistive device: Rolling walker (2 wheeled) Gait Pattern/deviations: Step-to pattern;Trunk flexed Gait velocity: decreased Gait velocity interpretation: Below normal speed for age/gender General Gait Details: Able to take a 2 steps along side bed with assist manuevering RW. Pain through LLE.   Stairs            Wheelchair Mobility    Modified Rankin (Stroke Patients Only)       Balance Overall balance assessment: Needs assistance Sitting-balance support: Feet supported;No upper extremity supported Sitting balance-Leahy Scale: Fair     Standing balance support: During functional activity;Bilateral upper extremity supported Standing balance-Leahy Scale: Poor Standing balance comment: Reliant on BUEs for support in standing. Pt requires Min-Mod A due to anterior lean.                              Pertinent Vitals/Pain Pain Assessment: Faces Faces Pain Scale: Hurts little more Pain Location: LLE with weight bearing. Pain Descriptors / Indicators: Sore Pain Intervention(s): Repositioned;Limited activity within patient's tolerance;Monitored during session    Home Living Family/patient expects to be discharged to:: Private residence Living Arrangements: Spouse/significant other Available Help at Discharge: Family;Available PRN/intermittently Type of Home: House Home Access: Ramped entrance     Home Layout: One level Home Equipment: Walker - 2 wheels;Wheelchair - Engineer, technical salesmanual;Wheelchair - power;Bedside commode;Hospital bed Additional Comments: 2L O2 at home    Prior Function Level of  Independence: Needs assistance   Gait / Transfers Assistance Needed:  RW for short distance ambulation. Has w/c and power chair for household and community mobility. Uses lift chair.  ADL's / Homemaking Assistance Needed: husband assisting with ADL, only sponge bathing        Hand Dominance   Dominant Hand: Right    Extremity/Trunk Assessment   Upper Extremity Assessment Upper Extremity Assessment: Generalized weakness (pt reports numbness in bil hands)    Lower Extremity Assessment Lower Extremity Assessment: Defer to PT evaluation RLE Deficits / Details: Grossly ~3/5 throughout. RLE Sensation: decreased light touch LLE Deficits / Details: open wound plantar surface left foot; vascular changes distal to knee BLEs LLE Sensation: decreased light touch       Communication   Communication: No difficulties  Cognition Arousal/Alertness: Awake/alert Behavior During Therapy: WFL for tasks assessed/performed;Anxious Overall Cognitive Status: Within Functional Limits for tasks assessed                                 General Comments: Pt emotionally labile.      General Comments General comments (skin integrity, edema, etc.): BP 90s/60s initially and then 80s/50s post activity.     Exercises     Assessment/Plan    PT Assessment Patient needs continued PT services  PT Problem List Decreased strength;Decreased mobility;Decreased balance;Pain;Impaired sensation;Decreased activity tolerance;Cardiopulmonary status limiting activity;Obesity;Decreased skin integrity       PT Treatment Interventions Therapeutic activities;Gait training;Therapeutic exercise;Patient/family education;Balance training;Functional mobility training;DME instruction;Wheelchair mobility training    PT Goals (Current goals can be found in the Care Plan section)  Acute Rehab PT Goals Patient Stated Goal: to get stronger and go home PT Goal Formulation: With patient Time For Goal Achievement: 03/01/17 Potential to Achieve Goals: Fair    Frequency Min  3X/week   Barriers to discharge Decreased caregiver support husband works during the day    Co-evaluation PT/OT/SLP Co-Evaluation/Treatment: Yes Reason for Co-Treatment: To address functional/ADL transfers PT goals addressed during session: Mobility/safety with mobility;Balance OT goals addressed during session: ADL's and self-care       AM-PAC PT "6 Clicks" Daily Activity  Outcome Measure Difficulty turning over in bed (including adjusting bedclothes, sheets and blankets)?: Total Difficulty moving from lying on back to sitting on the side of the bed? : Total Difficulty sitting down on and standing up from a chair with arms (e.g., wheelchair, bedside commode, etc,.)?: Total Help needed moving to and from a bed to chair (including a wheelchair)?: A Lot Help needed walking in hospital room?: A Lot Help needed climbing 3-5 steps with a railing? : Total 6 Click Score: 8    End of Session Equipment Utilized During Treatment: Oxygen Activity Tolerance: Patient limited by pain;Patient tolerated treatment well Patient left: in bed;with call bell/phone within reach;with bed alarm set Nurse Communication: Mobility status PT Visit Diagnosis: Pain;Muscle weakness (generalized) (M62.81);Unsteadiness on feet (R26.81) Pain - Right/Left: Left Pain - part of body: Ankle and joints of foot    Time: 1135-1207 PT Time Calculation (min) (ACUTE ONLY): 32 min   Charges:   PT Evaluation $PT Eval Moderate Complexity: 1 Procedure     PT G Codes:        Mylo Red, PT, DPT [phone]    Blake Divine A Asah Lamay 02/15/2017, 1:45 PM

## 2017-02-15 NOTE — Evaluation (Signed)
Occupational Therapy Evaluation Patient Details Name: Kimberly [name] MRN: [id number] DOB: 12/30/1955 Today's Date: 02/15/2017    History of Present Illness Patient is a 61 y/o female who presents with gangrene of left foot. Planned for transtibial amputation tomorrow. PMH includes morbid obesity, DM, PVD, diabetic neuropathy, depression, stroke, chronic diastolic HF, COPD, HTN, CAD.   Clinical Impression   Pt reports her husband was assisting with all ADL PTA and was performing limited mobility with assist. Currently pt required min assist +2 for sit to stand from EOB with side stepping toward [facility] for repositioning. Pt needs max-total assist for ADL at this time. Pt presenting with generalized weakness, pain, appears anxious regarding all movement, and poor balance impacting her independence and safety with ADL and mobility. Recommending SNF for follow up to maximize independence and safety with ADL and functional mobility. Pt would benefit from continued skilled OT to address established goals.    Follow Up Recommendations  SNF;Supervision/Assistance - 24 hour    Equipment Recommendations  None recommended by OT    Recommendations for Other Services       Precautions / Restrictions Precautions Precautions: Fall Precaution Comments: watch BP Restrictions Weight Bearing Restrictions: No      Mobility Bed Mobility Overal bed mobility: Needs Assistance Bed Mobility: Rolling;Sidelying to Sit;Sit to Supine Rolling: Mod assist;+2 for physical assistance Sidelying to sit: Mod assist;+2 for physical assistance;HOB elevated   Sit to supine: Mod assist;+2 for physical assistance;HOB elevated   General bed mobility comments: Step by step cues for technique and log roll technique. Use of rail for support. Assist to bring LEs off bed, and to elevate trunk. Assist to bring LEs to return to supine.   Transfers Overall transfer level: Needs assistance Equipment used: Rolling walker (2  wheeled) Transfers: Sit to/from Stand Sit to Stand: Min assist;+2 physical assistance;From elevated surface         General transfer comment: Assist to power to standing with use of momentum.     Balance Overall balance assessment: Needs assistance Sitting-balance support: Feet supported;No upper extremity supported Sitting balance-Leahy Scale: Fair     Standing balance support: During functional activity;Bilateral upper extremity supported Standing balance-Leahy Scale: Poor Standing balance comment: Reliant on BUEs for support in standing. Pt requires Min-Mod A due to anterior lean.                            ADL either performed or assessed with clinical judgement   ADL Overall ADL's : Needs assistance/impaired Eating/Feeding: Minimal assistance;Bed level   Grooming: Minimal assistance;Sitting   Upper Body Bathing: Maximal assistance;Sitting   Lower Body Bathing: Total assistance;Sit to/from stand   Upper Body Dressing : Maximal assistance;Sitting   Lower Body Dressing: Total assistance;Sit to/from stand               Functional mobility during ADLs: +2 for physical assistance;Rolling walker;Minimal assistance (for sit to stand with side stepping toward [facility]) General ADL Comments: Pt reports she has been incontinent since admission.     Vision         Perception     Praxis      Pertinent Vitals/Pain Pain Assessment: Faces Faces Pain Scale: Hurts little more Pain Location: LLE with weight bearing. Pain Descriptors / Indicators: Sore Pain Intervention(s): Repositioned;Limited activity within patient's tolerance;Monitored during session     Hand Dominance Right   Extremity/Trunk Assessment Upper Extremity Assessment Upper Extremity Assessment: Generalized weakness (pt reports  numbness in bil hands)   Lower Extremity Assessment Lower Extremity Assessment: Defer to PT evaluation RLE Deficits / Details: Grossly ~3/5 throughout. RLE  Sensation: decreased light touch LLE Deficits / Details: open wound plantar surface left foot; vascular changes distal to knee BLEs LLE Sensation: decreased light touch       Communication Communication Communication: No difficulties   Cognition Arousal/Alertness: Awake/alert Behavior During Therapy: WFL for tasks assessed/performed;Anxious Overall Cognitive Status: Within Functional Limits for tasks assessed                                 General Comments: Pt emotionally labile.   General Comments  BP 90s/60s initially and then 80s/50s post activity.     Exercises     Shoulder Instructions      Home Living Family/patient expects to be discharged to:: Private residence Living Arrangements: Spouse/significant other Available Help at Discharge: Family;Available PRN/intermittently Type of Home: House Home Access: Ramped entrance     Home Layout: One level     Bathroom Shower/Tub:  ("we dont have a shower situation")   Firefighter: Standard Bathroom Accessibility: No   Home Equipment: Environmental consultant - 2 wheels;Wheelchair - Engineer, technical sales - power;Bedside commode;Hospital bed   Additional Comments: 2L O2 at home      Prior Functioning/Environment Level of Independence: Needs assistance  Gait / Transfers Assistance Needed: RW for short distance ambulation. Has w/c and power chair for household and community mobility. Uses lift chair. ADL's / Homemaking Assistance Needed: husband assisting with ADL, only sponge bathing            OT Problem List: Decreased strength;Decreased range of motion;Decreased activity tolerance;Impaired balance (sitting and/or standing);Decreased safety awareness;Decreased knowledge of use of DME or AE;Decreased knowledge of precautions;Cardiopulmonary status limiting activity;Impaired sensation;Obesity;Impaired UE functional use;Pain;Increased edema      OT Treatment/Interventions: Self-care/ADL training;Therapeutic  exercise;Energy conservation;DME and/or AE instruction;Therapeutic activities;Patient/family education;Balance training    OT Goals(Current goals can be found in the care plan section) Acute Rehab OT Goals Patient Stated Goal: to get stronger OT Goal Formulation: With patient Time For Goal Achievement: 03/01/17 Potential to Achieve Goals: Good ADL Goals Pt Will Transfer to Toilet: with max assist;stand pivot transfer;bedside commode Pt/caregiver will Perform Home Exercise Program: Increased strength;Both right and left upper extremity;With theraband Additional ADL Goal #1: Pt will perform bed mobility with min assist as precursor to ADL and mobility.  OT Frequency: Min 2X/week   Barriers to D/C: Decreased caregiver support  husband works during day, no other family to provide 24/7 supervision       Co-evaluation PT/OT/SLP Co-Evaluation/Treatment: Yes Reason for Co-Treatment: To address functional/ADL transfers PT goals addressed during session: Mobility/safety with mobility;Balance OT goals addressed during session: ADL's and self-care      AM-PAC PT "6 Clicks" Daily Activity     Outcome Measure Help from another person eating meals?: A Little Help from another person taking care of personal grooming?: A Little Help from another person toileting, which includes using toliet, bedpan, or urinal?: Total Help from another person bathing (including washing, rinsing, drying)?: A Lot Help from another person to put on and taking off regular upper body clothing?: A Lot Help from another person to put on and taking off regular lower body clothing?: Total 6 Click Score: 12   End of Session Equipment Utilized During Treatment: Rolling walker;Oxygen Nurse Communication: Mobility status;Other (comment) (pt with incontinent BM (RN tech notified))  Activity  Tolerance: Patient tolerated treatment well Patient left: in bed;with call bell/phone within reach;with bed alarm set  OT Visit  Diagnosis: Unsteadiness on feet (R26.81);Other abnormalities of gait and mobility (R26.89);History of falling (Z91.81);Muscle weakness (generalized) (M62.81);Pain Pain - Right/Left: Left Pain - part of body: Leg                Time: 1135-1207 OT Time Calculation (min): 32 min Charges:  OT General Charges $OT Visit: 1 Procedure OT Evaluation $OT Eval Moderate Complexity: 1 Procedure G-Codes:     Indiyah Paone A. Brett Albinooffey, M.S., OTR/L Pager: [phone]  Gaye AlkenBailey A Brogan Martis 02/15/2017, 1:19 PM

## 2017-02-15 NOTE — Progress Notes (Signed)
PHARMACY - PHYSICIAN COMMUNICATION CRITICAL VALUE ALERT - BLOOD CULTURE IDENTIFICATION (BCID)  Results for orders placed or performed during the hospital encounter of 02/14/17  Blood Culture ID Panel (Reflexed) (Collected: 02/14/2017 10:56 AM)  Result Value Ref Range   Enterococcus species NOT DETECTED NOT DETECTED   Listeria monocytogenes NOT DETECTED NOT DETECTED   Staphylococcus species NOT DETECTED NOT DETECTED   Staphylococcus aureus NOT DETECTED NOT DETECTED   Streptococcus species NOT DETECTED NOT DETECTED   Streptococcus agalactiae NOT DETECTED NOT DETECTED   Streptococcus pneumoniae NOT DETECTED NOT DETECTED   Streptococcus pyogenes NOT DETECTED NOT DETECTED   Acinetobacter baumannii NOT DETECTED NOT DETECTED   Enterobacteriaceae species DETECTED (A) NOT DETECTED   Enterobacter cloacae complex NOT DETECTED NOT DETECTED   Escherichia coli NOT DETECTED NOT DETECTED   Klebsiella oxytoca NOT DETECTED NOT DETECTED   Klebsiella pneumoniae NOT DETECTED NOT DETECTED   Proteus species NOT DETECTED NOT DETECTED   Serratia marcescens NOT DETECTED NOT DETECTED   Carbapenem resistance NOT DETECTED NOT DETECTED   Haemophilus influenzae NOT DETECTED NOT DETECTED   Neisseria meningitidis NOT DETECTED NOT DETECTED   Pseudomonas aeruginosa NOT DETECTED NOT DETECTED   Candida albicans NOT DETECTED NOT DETECTED   Candida glabrata NOT DETECTED NOT DETECTED   Candida krusei NOT DETECTED NOT DETECTED   Candida parapsilosis NOT DETECTED NOT DETECTED   Candida tropicalis NOT DETECTED NOT DETECTED    Name of physician (or Provider) Contacted: J McClung  Changes to prescribed antibiotics required: On Zosyn, could consider change to cefepime.  Vernard GamblesVeronda Quinto Tippy, PharmD, BCPS  02/15/2017  7:05 AM

## 2017-02-15 NOTE — Progress Notes (Signed)
Patient does not want to sign informed consent until speaking to the surgeon again. Patient expressed concern about possible death and wanted to go over her history directly with the surgeon.

## 2017-02-15 NOTE — Consult Note (Signed)
ORTHOPAEDIC CONSULTATION  REQUESTING PHYSICIAN: Lonia Blood, MD  Chief Complaint: Gangrene left foot  HPI: Kimberly [name] is a 61 y.o. female who presents with gangrene of left forefoot and chronic osteomyelitis of the left calcaneus. Patient states that she has been seen at the wound center with Dr. Leanord Hawking. She states she is undergone serial debridements for the ulcer over the calcaneus. Patient states that she recently went to the wound center and was referred to the emergency room for the gangrene of the forefoot.  Past Medical History:  Diagnosis Date  . Anemia   . Anxiety   . Arthritis    "knees, feet, hands" (08/23/2016)  . Asthma   . CAD, NATIVE VESSEL    May 10, 2010 cath showed a hyperdynamic LV function, she had dominant circumflex anatomy with a 70-80% small OM1. She had diffuse diabetic plaque particularly in the distal LAD. She nondominant RCA.  Nondominant  . Cellulitis 10/15/2013  . CHF (congestive heart failure) (HCC)    Preserved EF  . Chronic bronchitis (HCC)    "probably once/ yr" (08/23/2016)  . Complication of anesthesia    "I've had difficulty waking up" (08/23/2016)  . COPD   . Depression   . Diabetic neuropathy (HCC)   . Dyspnea   . Endometrial hyperplasia   . ESRD (end stage renal disease) on dialysis (HCC)    "[facility]; MWF; [facility]" (08/23/2016)  . Family history of adverse reaction to anesthesia    mother had hard time waking up  . GERD (gastroesophageal reflux disease)   . History of hiatal hernia   . HYPERLIPIDEMIA   . HYPERTENSION   . Hypothyroidism   . Myocardial infarct, old   . On home oxygen therapy    "2L; 24/7" (08/23/2016)  . Paralyzed vocal cords   . Peripheral neuropathy    hx/notes 01/27/2010  . Pneumonia "several times"  . Proliferative retinopathy    hx/notes 01/27/2010  . PVD    CEA  . Restless leg syndrome    "mostly on the right" (08/23/2016)  . 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 Type II diabetes mellitus (HCC)    Past Surgical History:  Procedure Laterality Date  . AV FISTULA PLACEMENT Right 07/08/2015   Procedure: EXPLORATION RIGHT AXILLARY ARTERY AND RIGHT BRACHIAL VEIN;  Surgeon: Chuck Hint, MD;  Location: [facility] OR;  Service: Vascular;  Laterality: Right;  . AV FISTULA PLACEMENT Right 01/02/2017   Procedure: INSERTION OF ARTERIOVENOUS GORE-TEX  GRAFT  RIGHT ARM;  Surgeon: Chuck Hint, MD;  Location: [facility] OR;  Service: Vascular;  Laterality: Right;  . CAROTID ENDARTERECTOMY Left X 2  . CATARACT EXTRACTION W/ INTRAOCULAR LENS  IMPLANT, BILATERAL Bilateral   . CERVICAL BIOPSY  ~ 2015   "precancerous cells"  . COLONOSCOPY    . EYE SURGERY Bilateral    numerous surgeries  . INSERTION OF DIALYSIS CATHETER N/A 06/24/2015   Procedure: ULTRASOUND BILATERAL INTERNAL JUGULAR VEIN INSERTION OF DIALYSIS CATHETER LEFT INTERNAL JUGULAR VEIN ;  Surgeon: Pryor Ochoa, MD;  Location: [facility] OR;  Service: Vascular;  Laterality: N/A;  . INTRAUTERINE DEVICE INSERTION  ~ 2015  . VITRECTOMY Bilateral    Social History   Social History  . Marital status: Married    Spouse name: N/A  . Number of children: 0  . Years of education:  N/A   Occupational History  . disabled    Social History Main Topics  . Smoking status: Former Smoker    Packs/day: 1.00    Years: 20.00    Types: Cigarettes    Quit date: 09/11/2005  . Smokeless tobacco: Never Used  . Alcohol use No  . Drug use: No  . Sexual activity: No   Other Topics Concern  . None   Social History Narrative  . None   Family History  Problem Relation Age of Onset  . Cancer Mother        Breast, NHL  . Stroke Mother   . Peripheral vascular disease Father   . CAD Father 11  . Heart attack Father   . Hypertension Father   . Asthma Father   . Heart disease Father        before age 11   - negative except  otherwise stated in the family history section Allergies  Allergen Reactions  . Ciprofloxacin Itching  . Epinephrine Palpitations   Prior to Admission medications   Medication Sig Start Date End Date Taking? Authorizing Provider  albuterol (PROVENTIL HFA;VENTOLIN HFA) 108 (90 Base) MCG/ACT inhaler Inhale 2 puffs into the lungs every 6 (six) hours as needed for wheezing or shortness of breath.   Yes [provider]  aspirin EC 81 MG tablet Take 81 mg by mouth daily.   Yes [provider]  hydrocerin (EUCERIN) CREA Apply 1 application topically 2 (two) times daily. 07/20/15  Yes Dhungel, Nishant, MD  insulin glargine (LANTUS) 100 UNIT/ML injection Inject 0.4 mLs (40 Units total) into the skin 2 (two) times daily. 08/31/16  Yes Dorothea Ogle, MD  insulin lispro (HUMALOG) 100 UNIT/ML injection Inject 0-30 Units into the skin 3 (three) times daily before meals. Sliding Scale (also depends on what patient is eating) 100-150 = 10 units 151-200=10-15 units 201-299=20 units >300 =30 units   Yes [provider]  levothyroxine (SYNTHROID, LEVOTHROID) 88 MCG tablet Take 1 tablet (88 mcg total) by mouth daily before breakfast. 10/10/16  Yes Ngetich, Dinah C, NP  midodrine (PROAMATINE) 10 MG tablet Take 1 tablet (10 mg total) by mouth 2 (two) times daily with a meal. Patient taking differently: Take 10 mg by mouth 3 (three) times a week. Takes 30 minutes prior to dialysis on Monday, Wednesday and Friday 07/20/15  Yes Dhungel, Nishant, MD  Multiple Vitamins-Minerals (MULTIVITAMIN ADULT) CHEW Chew 1 tablet by mouth daily.   Yes [provider]  OXYGEN 2lpm 24/7- [facility]   Yes [provider]  docusate sodium (COLACE) 100 MG capsule Take 1 capsule (100 mg total) by mouth daily. Patient taking differently: Take 100 mg by mouth daily as needed for mild constipation.  10/10/16   Ngetich, Dinah C, NP  levonorgestrel (MIRENA) 20 MCG/24HR IUD 1 Intra Uterine Device (1 each  total) by Intrauterine route once. 08/16/15   Adolphus Birchwood, MD  oxyCODONE-acetaminophen (ROXICET) 5-325 MG tablet Take 1-2 tablets by mouth every 4 (four) hours as needed. Patient not taking: Reported on 02/14/2017 01/02/17   Chuck Hint, MD  sevelamer carbonate (RENVELA) 2.4 g PACK Take 2.4 g by mouth 3 (three) times daily with meals. Patient not taking: Reported on 02/14/2017 08/31/16   Dorothea Ogle, MD   Dg Chest Portable 1 View  Result Date: 02/14/2017 CLINICAL DATA:  Chest pain. EXAM: PORTABLE CHEST 1 VIEW COMPARISON:  09/11/2016 FINDINGS: Double-lumen dialysis catheter in place, unchanged. There is slight haziness in the lungs  suggesting mild interstitial edema. Pulmonary vascularity is normal. No effusions. No pneumothorax. IMPRESSION: Possible mild interstitial edema primarily on the left. Electronically Signed   By: Francene BoyersJames  Maxwell M.D.   On: 02/14/2017 13:08   Dg Foot Complete Left  Result Date: 02/14/2017 CLINICAL DATA:  Left foot abscess.  Diabetes. EXAM: LEFT FOOT - COMPLETE 3+ VIEW COMPARISON:  None. FINDINGS: There is extensive air in the soft tissues of left foot consistent with gas gangrene. Some bone detail is obscured by the gas. The gas is most prominent at the level of the metatarsal phalangeal joints with does extend into the midfoot. There is no discrete osteomyelitis. IMPRESSION: Extensive gas gangrene.  No discrete osteomyelitis. Electronically Signed   By: Francene BoyersJames  Maxwell M.D.   On: 02/14/2017 13:10   - pertinent xrays, CT, MRI studies were reviewed and independently interpreted  Positive ROS: All other systems have been reviewed and were otherwise negative with the exception of those mentioned in the HPI and as above.  Physical Exam: General: Alert, no acute distress Psychiatric: Patient is competent for consent with normal mood and affect Lymphatic: No axillary or cervical lymphadenopathy Cardiovascular: No pedal edema Respiratory: No cyanosis, no use of accessory  musculature GI: No organomegaly, abdomen is soft and non-tender  Skin:  Examination patient has dry gangrene left forefoot. The toes are black ischemic and cold. She has a black ulcer on the plantar aspect of the metatarsal heads. She has a chronic Wagner grade 3 ulcer of the left calcaneus with exposed calcaneus.   Neurologic: Patient does not have protective sensation bilateral lower extremities.   MUSCULOSKELETAL:  Patient has brawny skin changes to the left lower extremity with lymphedema and venous insufficiency. There are no leg ulcers. She does not have a palpable dorsalis pedis pulse.  Assessment: Assessment: Diabetic insensate neuropathy end-stage renal disease on dialysis with peripheral vascular disease and gangrene of the left forefoot and osteomyelitis of the left calcaneus.  Plan: Plan: We will plan for left transtibial amputation. Patient will still need an ankle-brachial indices. If the transtibial amputation does not heal patient will need further workup with vascular surgery.  Plan for left transtibial amputation tomorrow Friday afternoon, patient will need to have her dialysis completed prior to noon.  Thank you for the consult and the opportunity to see Ms. Elouise Munroerews  Marcus Duda, MD [facility] [phone] 7:09 AM

## 2017-02-15 NOTE — Progress Notes (Signed)
[facility] TEAM 1 - Stepdown/ICU TEAM  Kimberly ManilaSusan K [name]  ZOX:[id number] DOB: 1955-12-14 DOA: 02/14/2017 PCP: [name], [name], MD    Brief Narrative:   61 y.o. 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  The pt experienced acute worsening of the foot w/ erythema and drainage, and was instructed to come to the ED for evaluation.     Subjective: The patient is resting comfortably in bed at the time of visit.  She reports a mild amount of pain in her foot and some pain in her low back but states it is tolerable.  She denies chest pain shortness of breath fevers chills nausea or vomiting.  Assessment & Plan:  Left diabetic foot chronic wound - forefoot gangrene and calcaneus osteomyelitis Dr. Lajoyce Cornersuda following - for transtibial amputation 6/8  Gram-negative rod bacteremia Felt to be due to above - dialysis catheter out - continue antibiotic therapy  ESRD on HD M/W/F Nephrology following - for dialysis in a.m. prior to OR  Chronic hypotension Continue midodrine   CAD No SSCP/anginal sx   Anemia of chronic kidney disease Transfuse to assure Hgb 8.0 or > prior to OR - plan to give 1U PRBC in HD 6/8 if Hgb remains below 8.0 on recheck   DM 2  Grade 2 Chronic diastolic congestive heart failure Volume management per HD - no clinically signif volume overload at this time   Chronic hypoxic respiratory failure - obesity hypoventilation - COPD Stable at present - may present challenges in recovering from anesthesia   Hypothyroidism  MRSA screen +  DVT prophylaxis: Subcutaneous heparin Code Status: FULL CODE Family Communication: no family present at time of exam  Disposition Plan: SDU  Consultants:  Nephrology Orthopedics  Procedures: none  Antimicrobials:  Zosyn 6/6 > Vancomycin 6/6 >   Objective: Blood pressure (!) 83/51, pulse 89, temperature 98.6 F (37 C), temperature source Oral, resp. rate  (!) 23, height 5\' 4"  (1.626 m), weight 115.6 kg (254 lb 13.6 oz), SpO2 99 %.  Intake/Output Summary (Last 24 hours) at 02/15/17 1624 Last data filed at 02/15/17 1500  Gross per 24 hour  Intake              250 ml  Output             2666 ml  Net            -2416 ml   Filed Weights   02/14/17 1510 02/14/17 1913 02/15/17 0340  Weight: 117.3 kg (258 lb 9.6 oz) 114.4 kg (252 lb 3.3 oz) 115.6 kg (254 lb 13.6 oz)    Examination: General: No acute respiratory distress at rest in bed  Lungs: distant BS th/o all fields - no wheeze or crackles Cardiovascular: Regular rate and rhythm without murmur - distant HS  Abdomen: Nontender, obese, soft, bowel sounds positive, no rebound, no ascites, no appreciable mass Extremities: No significant edema bilateral lower extremities - change of chronic venous stasis dermatitis B LE   CBC:  Recent Labs Lab 02/14/17 0957 02/15/17 0310  WBC 22.8* 17.9*  NEUTROABS 20.7*  --   HGB 9.1* 7.6*  HCT 28.9* 25.1*  MCV 101.4* 101.2*  PLT 240 215   Basic Metabolic Panel:  Recent Labs Lab 02/14/17 0957 02/14/17 1132 02/15/17 0310  NA 124*  --  130*  K 5.6*  --  4.9  CL 88*  --  90*  CO2 20*  --  24  GLUCOSE 400*  --  163*  BUN 48*  --  30*  CREATININE 5.63*  --  4.28*  CALCIUM 9.2  --  8.6*  MG  --  2.0  --    GFR: Estimated Creatinine Clearance: 17 mL/min (A) (by C-G formula based on SCr of 4.28 mg/dL (H)).  Liver Function Tests:  Recent Labs Lab 02/14/17 0957 02/15/17 0310  AST 25 26  ALT 26 23  ALKPHOS 303* 306*  BILITOT 0.9 1.4*  PROT 8.8* 7.7  ALBUMIN 2.6* 2.2*    Coagulation Profile:  Recent Labs Lab 02/15/17 0310  INR 1.33    HbA1C: Hemoglobin A1C  Date/Time Value Ref Range Status  10/06/2016 6.6  Final   Hgb A1c MFr Bld  Date/Time Value Ref Range Status  08/23/2016 01:00 PM 9.9 (H) 4.8 - 5.6 % Final    Comment:    (NOTE)         Pre-diabetes: 5.7 - 6.4         Diabetes: >6.4         Glycemic control for  adults with diabetes: <7.0   04/15/2015 05:44 AM 8.7 (H) 4.8 - 5.6 % Final    Comment:    (NOTE)         Pre-diabetes: 5.7 - 6.4         Diabetes: >6.4         Glycemic control for adults with diabetes: <7.0     CBG:  Recent Labs Lab 02/14/17 1334 02/14/17 2114 02/15/17 0743 02/15/17 1208  GLUCAP 285* 170* 150* 204*    Recent Results (from the past 240 hour(s))  Culture, blood (Routine X 2) w Reflex to ID Panel     Status: None (Preliminary result)   Collection Time: 02/14/17 10:56 AM  Result Value Ref Range Status   Specimen Description BLOOD LEFT HAND  Final   Special Requests IN PEDIATRIC BOTTLE Blood Culture adequate volume  Final   Culture  Setup Time   Final    GRAM NEGATIVE RODS IN PEDIATRIC BOTTLE Organism ID to follow CRITICAL RESULT CALLED TO, READ BACK BY AND VERIFIED WITH: Merlene Morse PHARMD, AT [phone] 02/15/17 BY D.VANHOOK    Culture GRAM NEGATIVE RODS  Final   Report Status PENDING  Incomplete  Blood Culture ID Panel (Reflexed)     Status: Abnormal   Collection Time: 02/14/17 10:56 AM  Result Value Ref Range Status   Enterococcus species NOT DETECTED NOT DETECTED Final   Listeria monocytogenes NOT DETECTED NOT DETECTED Final   Staphylococcus species NOT DETECTED NOT DETECTED Final   Staphylococcus aureus NOT DETECTED NOT DETECTED Final   Streptococcus species NOT DETECTED NOT DETECTED Final   Streptococcus agalactiae NOT DETECTED NOT DETECTED Final   Streptococcus pneumoniae NOT DETECTED NOT DETECTED Final   Streptococcus pyogenes NOT DETECTED NOT DETECTED Final   Acinetobacter baumannii NOT DETECTED NOT DETECTED Final   Enterobacteriaceae species DETECTED (A) NOT DETECTED Final    Comment: Enterobacteriaceae represent a large family of gram negative bacteria, not a single organism. Refer to culture for further identification. CRITICAL RESULT CALLED TO, READ BACK BY AND VERIFIED WITH: VAnnice Needy PHARMD, AT [phone] 02/15/17 BY D.VANHOOK    Enterobacter cloacae  complex NOT DETECTED NOT DETECTED Final   Escherichia coli NOT DETECTED NOT DETECTED Final   Klebsiella oxytoca NOT DETECTED NOT DETECTED Final   Klebsiella pneumoniae NOT DETECTED NOT DETECTED Final   Proteus species NOT  DETECTED NOT DETECTED Final   Serratia marcescens NOT DETECTED NOT DETECTED Final   Carbapenem resistance NOT DETECTED NOT DETECTED Final   Haemophilus influenzae NOT DETECTED NOT DETECTED Final   Neisseria meningitidis NOT DETECTED NOT DETECTED Final   Pseudomonas aeruginosa NOT DETECTED NOT DETECTED Final   Candida albicans NOT DETECTED NOT DETECTED Final   Candida glabrata NOT DETECTED NOT DETECTED Final   Candida krusei NOT DETECTED NOT DETECTED Final   Candida parapsilosis NOT DETECTED NOT DETECTED Final   Candida tropicalis NOT DETECTED NOT DETECTED Final  Culture, blood (Routine X 2) w Reflex to ID Panel     Status: None (Preliminary result)   Collection Time: 02/14/17 11:30 AM  Result Value Ref Range Status   Specimen Description BLOOD LEFT HAND  Final   Special Requests   Final    BOTTLES DRAWN AEROBIC AND ANAEROBIC Blood Culture adequate volume   Culture NO GROWTH 1 DAY  Final   Report Status PENDING  Incomplete  MRSA PCR Screening     Status: Abnormal   Collection Time: 02/14/17  2:26 PM  Result Value Ref Range Status   MRSA by PCR POSITIVE (A) NEGATIVE Final    Comment:        The GeneXpert MRSA Assay (FDA approved for NASAL specimens only), is one component of a comprehensive MRSA colonization surveillance program. It is not intended to diagnose MRSA infection nor to guide or monitor treatment for MRSA infections. RESULT CALLED TO, READ BACK BY AND VERIFIED WITH: Ronnald Nian, RN [phone] 02/14/2017 T.TYSOR      Scheduled Meds: . aspirin EC  81 mg Oral Daily  . chlorhexidine      . Chlorhexidine Gluconate Cloth  6 each Topical Q0600  . docusate sodium  100 mg Oral Daily  . heparin  5,000 Units Subcutaneous Q8H  . insulin aspart  0-9 Units  Subcutaneous TID WC  . insulin glargine  20 Units Subcutaneous BID  . levothyroxine  88 mcg Oral QAC breakfast  . lidocaine  5 mL Subcutaneous Once  . midodrine  10 mg Oral Once per day on Mon Wed Fri  . [START ON 02/16/2017] midodrine  10 mg Oral Q M,W,F-HD  . mupirocin ointment  1 application Nasal BID  . sevelamer carbonate  2,400 mg Oral TID WC     LOS: 1 day   Lonia Blood, MD [facility] Office  [phone] Pager - Text Page per Amion as per below:  On-Call/Text Page:      [email or web address]      password [email or web address]  If 7PM-7AM, please contact night-coverage [email or web address] Password [facility] 02/15/2017, 4:24 PM

## 2017-02-15 NOTE — Progress Notes (Signed)
Admit: 02/14/2017 LOS: 1  41F ESRD [place] with progressive L foot wound gangrenous  Subjective:  Seen by Sharol Given, plan for transtibial amputation 6/8 HD yesterday,  2.7L UF B Cx with GNR in < 24h, on Zosyn Pt expresses worry about surgical risk  06/06 0701 - 06/07 0700 In: 50 [I.V.:50] Out: 2666 [Stool:1]  Filed Weights   02/14/17 1510 02/14/17 1913 02/15/17 0340  Weight: 117.3 kg (258 lb 9.6 oz) 114.4 kg (252 lb 3.3 oz) 115.6 kg (254 lb 13.6 oz)    Scheduled Meds: . aspirin EC  81 mg Oral Daily  . Chlorhexidine Gluconate Cloth  6 each Topical Q0600  . docusate sodium  100 mg Oral Daily  . heparin  5,000 Units Subcutaneous Q8H  . insulin aspart  0-9 Units Subcutaneous TID WC  . insulin glargine  20 Units Subcutaneous BID  . levothyroxine  88 mcg Oral QAC breakfast  . midodrine  10 mg Oral Once per day on Mon Wed Fri  . [START ON 02/16/2017] midodrine  10 mg Oral Q M,W,F-HD  . mupirocin ointment  1 application Nasal BID  . sevelamer carbonate  2,400 mg Oral TID WC   Continuous Infusions: . piperacillin-tazobactam (ZOSYN)  IV 3.375 g (02/15/17 0814)  . vancomycin Stopped (02/14/17 1907)   PRN Meds:.acetaminophen **OR** acetaminophen, albuterol, ondansetron **OR** ondansetron (ZOFRAN) IV, oxyCODONE-acetaminophen, senna  Current Labs: reviewed  B Cx 6/6 with GNRs   Physical Exam:  Blood pressure (!) 86/42, pulse 87, temperature 98.5 F (36.9 C), temperature source Oral, resp. rate 19, height _0  (1.626 m), weight 115.6 kg (254 lb 13.6 oz), SpO2 95 %. GEN: Obese, chronically ill-appearing ENT: NCAT, left CEA scar present EYES: EOMI, wearing glasses CV: RRR PULM: CTAB, anterior, normal work of breathing, speaks in full sentences ABD: Soft, nontender, obese SKIN: Both legs with chronic venous stasis changes to the knees scaling and thickening of skin, left foot with increased erythema compared to the right. On the ball of the left foot that area of bruising, 2 cm ulcer EXT: As  above, right upper extremity AV graft with bruit  Outpt HD Orders Unit: [place] Days: MWF Time: 4.5h Dialyzer: F180 EDW: 113.5kg K/Ca: 2K 2.25 Ca Access: AVG cannulation in progress has had 3 successful cannulations Needle Size: progressing UF Proflie: 2 VDRA: none EPO: Miercera 44mg q2wk, last 5/30 IV Fe: Currently has rec 3/5 Venofer [id number] Heparin: 6k IVB pre Tx, 5K IVB mid run Midodrine used preHD  A [place]. [place] [facility] on midodrine; has TDC and AVG on cannulation protocol 2. Nonhealing L foot ulcer with cellulitis on Vanc/Zosn per ortho and TRH 3. GNR Bacteremia, Enterobacteriaceae on rapid ID panel, on Zosyn; source likely #2 4. Hyperkalemia, mild resolved with HD 5. Hyponatremia, mild, partially related to hypergylcemia + likely excess free water intake; improved this AM 6. Leukocytosis 2/2 #2; downtrending today 7. CAD, AFib, dCHF 8. Anemia next ESA due 6/13, mircera 7576mq2wk; hold IV Fe with active infection  P 1. HD tomorrow in AM using AVG 2. Given bacteremia if no cannulation problems will ask VVS to remove TDC 3. Amputation tomorrow after HD; hold heparin   RyaPearson Grippe 02/15/2017, 9:42 AM   Recent Labs Lab 02/14/17 0957 02/15/17 0310  NA 124* 130*  K 5.6* 4.9  CL 88* 90*  CO2 20* 24  GLUCOSE 400* 163*  BUN 48* 30*  CREATININE 5.63* 4.28*  CALCIUM 9.2 8.6*    Recent Labs Lab 02/14/17  0957 02/15/17 0310  WBC 22.8* 17.9*  NEUTROABS 20.7*  --   HGB 9.1* 7.6*  HCT 28.9* 25.1*  MCV 101.4* 101.2*  PLT 240 215

## 2017-02-15 NOTE — Plan of Care (Signed)
Problem: Tissue Perfusion: Goal: Risk factors for ineffective tissue perfusion will decrease Outcome: Not Progressing Pt has cellulitis to both legs left BKA scheduled for tommorow

## 2017-02-15 NOTE — Progress Notes (Signed)
VASCULAR LAB PRELIMINARY  ARTERIAL  ABI completed:ABIs indicative of calcified vessels.     RIGHT    LEFT    PRESSURE WAVEFORM  PRESSURE WAVEFORM  BRACHIAL restricted  BRACHIAL 90 T  DP   DP    AT 300 B AT 118 B  PT 65 M PT 87 M  PER   PER    GREAT TOE  NA GREAT TOE  NA    RIGHT LEFT  ABI 3.33 1.31     Kimberly [name], RVT 02/15/2017, 10:40 AM

## 2017-02-16 ENCOUNTER — Encounter (HOSPITAL_COMMUNITY): Payer: Self-pay | Admitting: *Deleted

## 2017-02-16 ENCOUNTER — Inpatient Hospital Stay (HOSPITAL_COMMUNITY): Payer: Medicare Other | Admitting: Anesthesiology

## 2017-02-16 ENCOUNTER — Encounter (HOSPITAL_COMMUNITY)
Admission: EM | Disposition: A | Discharge status: Skilled Nursing Facility | Payer: Self-pay | Source: Home / Self Care | Attending: Internal Medicine

## 2017-02-16 DIAGNOSIS — R7881 Bacteremia: Secondary | ICD-10-CM

## 2017-02-16 DIAGNOSIS — E662 Morbid (severe) obesity with alveolar hypoventilation: Secondary | ICD-10-CM

## 2017-02-16 DIAGNOSIS — Z992 Dependence on renal dialysis: Secondary | ICD-10-CM

## 2017-02-16 DIAGNOSIS — I9589 Other hypotension: Secondary | ICD-10-CM

## 2017-02-16 DIAGNOSIS — L89152 Pressure ulcer of sacral region, stage 2: Secondary | ICD-10-CM

## 2017-02-16 DIAGNOSIS — Z862 Personal history of diseases of the blood and blood-forming organs and certain disorders involving the immune mechanism: Secondary | ICD-10-CM

## 2017-02-16 DIAGNOSIS — E1122 Type 2 diabetes mellitus with diabetic chronic kidney disease: Secondary | ICD-10-CM

## 2017-02-16 DIAGNOSIS — T827XXA Infection and inflammatory reaction due to other cardiac and vascular devices, implants and grafts, initial encounter: Secondary | ICD-10-CM

## 2017-02-16 HISTORY — PX: AMPUTATION: SHX166

## 2017-02-16 LAB — CBC
HCT: 27.7 % — ABNORMAL LOW (ref 36.0–46.0)
HEMOGLOBIN: 8.6 g/dL — AB (ref 12.0–15.0)
MCH: 31.2 pg (ref 26.0–34.0)
MCHC: 31 g/dL (ref 30.0–36.0)
MCV: 100.4 fL — AB (ref 78.0–100.0)
Platelets: 215 10*3/uL (ref 150–400)
RBC: 2.76 MIL/uL — ABNORMAL LOW (ref 3.87–5.11)
RDW: 16.3 % — ABNORMAL HIGH (ref 11.5–15.5)
WBC: 17.8 10*3/uL — ABNORMAL HIGH (ref 4.0–10.5)

## 2017-02-16 LAB — POCT I-STAT 4, (NA,K, GLUC, HGB,HCT)
Glucose, Bld: 154 mg/dL — ABNORMAL HIGH (ref 65–99)
HCT: 32 % — ABNORMAL LOW (ref 36.0–46.0)
Hemoglobin: 10.9 g/dL — ABNORMAL LOW (ref 12.0–15.0)
POTASSIUM: 4.4 mmol/L (ref 3.5–5.1)
SODIUM: 130 mmol/L — AB (ref 135–145)

## 2017-02-16 LAB — GLUCOSE, CAPILLARY
GLUCOSE-CAPILLARY: 116 mg/dL — AB (ref 65–99)
GLUCOSE-CAPILLARY: 138 mg/dL — AB (ref 65–99)
Glucose-Capillary: 107 mg/dL — ABNORMAL HIGH (ref 65–99)
Glucose-Capillary: 183 mg/dL — ABNORMAL HIGH (ref 65–99)

## 2017-02-16 LAB — RENAL FUNCTION PANEL
Albumin: 2.2 g/dL — ABNORMAL LOW (ref 3.5–5.0)
Anion gap: 15 (ref 5–15)
BUN: 49 mg/dL — AB (ref 6–20)
CO2: 22 mmol/L (ref 22–32)
Calcium: 9 mg/dL (ref 8.9–10.3)
Chloride: 92 mmol/L — ABNORMAL LOW (ref 101–111)
Creatinine, Ser: 5.7 mg/dL — ABNORMAL HIGH (ref 0.44–1.00)
GFR calc Af Amer: 8 mL/min — ABNORMAL LOW (ref >60)
GFR calc non Af Amer: 7 mL/min — ABNORMAL LOW (ref >60)
GLUCOSE: 145 mg/dL — AB (ref 65–99)
Phosphorus: 7 mg/dL — ABNORMAL HIGH (ref 2.5–4.6)
Potassium: 5.1 mmol/L (ref 3.5–5.1)
SODIUM: 129 mmol/L — AB (ref 135–145)

## 2017-02-16 LAB — PREPARE RBC (CROSSMATCH)

## 2017-02-16 SURGERY — AMPUTATION BELOW KNEE
Anesthesia: General | Laterality: Left

## 2017-02-16 MED ORDER — FENTANYL CITRATE (PF) 250 MCG/5ML IJ SOLN
INTRAMUSCULAR | Status: AC
  Filled 2017-02-16: qty 5

## 2017-02-16 MED ORDER — METOCLOPRAMIDE HCL 5 MG/ML IJ SOLN
5.0000 mg | Freq: Three times a day (TID) | INTRAMUSCULAR | Status: DC | PRN
Start: 2017-02-16 — End: 2017-02-17

## 2017-02-16 MED ORDER — FENTANYL CITRATE (PF) 250 MCG/5ML IJ SOLN
INTRAMUSCULAR | Status: DC | PRN
  Administered 2017-02-16: 100 ug via INTRAVENOUS
  Administered 2017-02-16: 50 ug via INTRAVENOUS

## 2017-02-16 MED ORDER — SODIUM CHLORIDE 0.9 % IV SOLN
INTRAVENOUS | Status: DC
  Administered 2017-02-16 (×2): via INTRAVENOUS

## 2017-02-16 MED ORDER — PROPOFOL 10 MG/ML IV BOLUS
INTRAVENOUS | Status: AC
  Filled 2017-02-16: qty 20

## 2017-02-16 MED ORDER — DOCUSATE SODIUM 100 MG PO CAPS
100.0000 mg | ORAL_CAPSULE | Freq: Two times a day (BID) | ORAL | Status: DC
  Administered 2017-02-19 (×2): 100 mg via ORAL
  Filled 2017-02-16 (×6): qty 1

## 2017-02-16 MED ORDER — OXYCODONE HCL 5 MG PO TABS
5.0000 mg | ORAL_TABLET | ORAL | Status: DC | PRN
  Administered 2017-02-17 – 2017-02-18 (×3): 10 mg via ORAL
  Filled 2017-02-16 (×3): qty 2

## 2017-02-16 MED ORDER — HYDROMORPHONE HCL 1 MG/ML IJ SOLN
INTRAMUSCULAR | Status: AC
  Filled 2017-02-16: qty 0.5

## 2017-02-16 MED ORDER — MIDODRINE HCL 5 MG PO TABS
ORAL_TABLET | ORAL | Status: AC
  Administered 2017-02-16: 10 mg via ORAL
  Filled 2017-02-16: qty 2

## 2017-02-16 MED ORDER — PHENYLEPHRINE 40 MCG/ML (10ML) SYRINGE FOR IV PUSH (FOR BLOOD PRESSURE SUPPORT)
PREFILLED_SYRINGE | INTRAVENOUS | Status: DC | PRN
  Administered 2017-02-16: 200 ug via INTRAVENOUS
  Administered 2017-02-16 (×2): 80 ug via INTRAVENOUS

## 2017-02-16 MED ORDER — ACETAMINOPHEN 325 MG PO TABS: 650.0000 mg | ORAL_TABLET | Freq: Four times a day (QID) | ORAL | Status: DC | PRN

## 2017-02-16 MED ORDER — MIDODRINE HCL 5 MG PO TABS
10.0000 mg | ORAL_TABLET | Freq: Every day | ORAL | Status: DC
  Administered 2017-02-17 – 2017-02-20 (×4): 10 mg via ORAL
  Filled 2017-02-16 (×3): qty 2

## 2017-02-16 MED ORDER — DEXTROSE 5 % IV SOLN
500.0000 mg | Freq: Four times a day (QID) | INTRAVENOUS | Status: DC | PRN
  Filled 2017-02-16: qty 5

## 2017-02-16 MED ORDER — HYDROMORPHONE HCL 1 MG/ML IJ SOLN
INTRAMUSCULAR | Status: AC
  Administered 2017-02-16: 0.5 mg via INTRAVENOUS
  Filled 2017-02-16: qty 0.5

## 2017-02-16 MED ORDER — MAGNESIUM CITRATE PO SOLN
1.0000 | Freq: Once | ORAL | Status: DC | PRN
  Filled 2017-02-16: qty 296

## 2017-02-16 MED ORDER — HYDROMORPHONE HCL 1 MG/ML IJ SOLN
1.0000 mg | INTRAMUSCULAR | Status: DC | PRN
Start: 2017-02-16 — End: 2017-02-18
  Administered 2017-02-16 – 2017-02-17 (×2): 1 mg via INTRAVENOUS
  Filled 2017-02-16 (×2): qty 1

## 2017-02-16 MED ORDER — METOCLOPRAMIDE HCL 5 MG PO TABS: 5.0000 mg | ORAL_TABLET | Freq: Three times a day (TID) | ORAL | Status: DC | PRN

## 2017-02-16 MED ORDER — EPHEDRINE SULFATE 50 MG/ML IJ SOLN
INTRAMUSCULAR | Status: DC | PRN
  Administered 2017-02-16: 25 mg via INTRAVENOUS

## 2017-02-16 MED ORDER — ONDANSETRON HCL 4 MG/2ML IJ SOLN: 4.0000 mg | Freq: Four times a day (QID) | INTRAMUSCULAR | Status: DC | PRN

## 2017-02-16 MED ORDER — SODIUM CHLORIDE 0.9 % IV SOLN
INTRAVENOUS | Status: DC
  Administered 2017-02-17: 06:00:00 via INTRAVENOUS

## 2017-02-16 MED ORDER — POLYETHYLENE GLYCOL 3350 17 G PO PACK: 17.0000 g | PACK | Freq: Every day | ORAL | Status: DC | PRN

## 2017-02-16 MED ORDER — HYDROMORPHONE HCL 1 MG/ML IJ SOLN
0.2500 mg | INTRAMUSCULAR | Status: DC | PRN
  Administered 2017-02-16 (×2): 0.5 mg via INTRAVENOUS

## 2017-02-16 MED ORDER — ONDANSETRON HCL 4 MG PO TABS: 4.0000 mg | ORAL_TABLET | Freq: Four times a day (QID) | ORAL | Status: DC | PRN

## 2017-02-16 MED ORDER — METHOCARBAMOL 500 MG PO TABS: 500.0000 mg | ORAL_TABLET | Freq: Four times a day (QID) | ORAL | Status: DC | PRN

## 2017-02-16 MED ORDER — BISACODYL 10 MG RE SUPP: 10.0000 mg | Freq: Every day | RECTAL | Status: DC | PRN

## 2017-02-16 MED ORDER — ETOMIDATE 2 MG/ML IV SOLN
INTRAVENOUS | Status: DC | PRN
  Administered 2017-02-16: 10 mg via INTRAVENOUS

## 2017-02-16 MED ORDER — ACETAMINOPHEN 650 MG RE SUPP: 650.0000 mg | Freq: Four times a day (QID) | RECTAL | Status: DC | PRN

## 2017-02-16 MED ORDER — LIDOCAINE 2% (20 MG/ML) 5 ML SYRINGE
INTRAMUSCULAR | Status: DC | PRN
  Administered 2017-02-16: 40 mg via INTRAVENOUS

## 2017-02-16 MED ORDER — SUCCINYLCHOLINE CHLORIDE 200 MG/10ML IV SOSY
PREFILLED_SYRINGE | INTRAVENOUS | Status: DC | PRN
  Administered 2017-02-16: 80 mg via INTRAVENOUS

## 2017-02-16 SURGICAL SUPPLY — 33 items
BLADE SAW RECIP 87.9 MT (BLADE) ×3 IMPLANT
BLADE SURG 21 STRL SS (BLADE) ×3 IMPLANT
BNDG COHESIVE 6X5 TAN STRL LF (GAUZE/BANDAGES/DRESSINGS) ×3 IMPLANT
BNDG GAUZE ELAST 4 BULKY (GAUZE/BANDAGES/DRESSINGS) ×3 IMPLANT
COVER SURGICAL LIGHT HANDLE (MISCELLANEOUS) ×3 IMPLANT
CUFF TOURNIQUET SINGLE 34IN LL (TOURNIQUET CUFF) ×3 IMPLANT
CUFF TOURNIQUET SINGLE 44IN (TOURNIQUET CUFF) IMPLANT
DRAPE INCISE IOBAN 66X45 STRL (DRAPES) ×3 IMPLANT
DRAPE U-SHAPE 47X51 STRL (DRAPES) ×3 IMPLANT
DRESSING PREVENA PLUS CUSTOM (GAUZE/BANDAGES/DRESSINGS) ×1 IMPLANT
DRSG PREVENA PLUS CUSTOM (GAUZE/BANDAGES/DRESSINGS) ×3
DRSG VAC ATS MED SENSATRAC (GAUZE/BANDAGES/DRESSINGS) ×3 IMPLANT
ELECT REM PT RETURN 9FT ADLT (ELECTROSURGICAL) ×3
ELECTRODE REM PT RTRN 9FT ADLT (ELECTROSURGICAL) ×1 IMPLANT
GLOVE BIOGEL PI IND STRL 9 (GLOVE) ×1 IMPLANT
GLOVE BIOGEL PI INDICATOR 9 (GLOVE) ×2
GLOVE SURG ORTHO 9.0 STRL STRW (GLOVE) ×3 IMPLANT
GOWN STRL REUS W/ TWL XL LVL3 (GOWN DISPOSABLE) ×2 IMPLANT
GOWN STRL REUS W/TWL XL LVL3 (GOWN DISPOSABLE) ×4
KIT BASIN OR (CUSTOM PROCEDURE TRAY) ×3 IMPLANT
KIT ROOM TURNOVER OR (KITS) ×3 IMPLANT
MANIFOLD NEPTUNE II (INSTRUMENTS) ×3 IMPLANT
NS IRRIG 1000ML POUR BTL (IV SOLUTION) ×3 IMPLANT
PACK ORTHO EXTREMITY (CUSTOM PROCEDURE TRAY) ×3 IMPLANT
PAD ARMBOARD 7.5X6 YLW CONV (MISCELLANEOUS) ×3 IMPLANT
SPONGE LAP 18X18 X RAY DECT (DISPOSABLE) IMPLANT
STAPLER VISISTAT 35W (STAPLE) ×3 IMPLANT
STOCKINETTE IMPERVIOUS LG (DRAPES) ×3 IMPLANT
SUT SILK 2 0 (SUTURE) ×2
SUT SILK 2-0 18XBRD TIE 12 (SUTURE) ×1 IMPLANT
SUT VIC AB 1 CTX 27 (SUTURE) IMPLANT
TOWEL OR 17X26 10 PK STRL BLUE (TOWEL DISPOSABLE) ×3 IMPLANT
WND VAC CANISTER 500ML (MISCELLANEOUS) ×3 IMPLANT

## 2017-02-16 NOTE — Progress Notes (Signed)
Dialysis treatment completed.  3000 mL ultrafiltrated and net fluid removal 2000 mL.    Patient status unchanged. Lung sounds diminished and clear to ausculation in all fields. Moderate BLE pitting edema. Cardiac: NSR.  Disconnected lines and removed needles.  Pressure held for 10 minutes and band aid/gauze dressing applied.  Report given to bedside RN, Britta MccreedyBarbara.

## 2017-02-16 NOTE — Anesthesia Preprocedure Evaluation (Addendum)
Anesthesia Evaluation  Patient identified by MRN, date of birth, ID band Patient awake    Reviewed: Allergy & Precautions, H&P , NPO status , Patient's Chart, lab work & pertinent test results  Airway Mallampati: III  TM Distance: >3 FB Neck ROM: Full    Dental no notable dental hx. (+) Teeth Intact, Dental Advisory Given   Pulmonary asthma , COPD,  COPD inhaler, former smoker,    Pulmonary exam normal breath sounds clear to auscultation       Cardiovascular hypertension, Pt. on medications + CAD, + Cardiac Stents, + Peripheral Vascular Disease and +CHF   Rhythm:Regular Rate:Normal     Neuro/Psych Anxiety Depression CVA    GI/Hepatic Neg liver ROS, GERD  ,  Endo/Other  diabetes, Insulin DependentHypothyroidism Morbid obesity  Renal/GU ESRF and DialysisRenal disease  negative genitourinary   Musculoskeletal  (+) Arthritis , Osteoarthritis,    Abdominal   Peds  Hematology negative hematology ROS (+) anemia ,   Anesthesia Other Findings   Reproductive/Obstetrics negative OB ROS                            Anesthesia Physical Anesthesia Plan  ASA: III  Anesthesia Plan: General   Post-op Pain Management:    Induction: Intravenous  PONV Risk Score and Plan: 4 or greater and Ondansetron, Dexamethasone, Propofol and Midazolam  Airway Management Planned: Oral ETT  Additional Equipment:   Intra-op Plan:   Post-operative Plan: Extubation in OR  Informed Consent: I have reviewed the patients History and Physical, chart, labs and discussed the procedure including the risks, benefits and alternatives for the proposed anesthesia with the patient or authorized representative who has indicated his/her understanding and acceptance.   Dental advisory given  Plan Discussed with: CRNA  Anesthesia Plan Comments: (Pt declines spinal.)       Anesthesia Quick Evaluation

## 2017-02-16 NOTE — Progress Notes (Signed)
Pharmacy Antibiotic Note Kimberly [name] is a 61 y.o. female admitted on 02/14/2017 with gangrene of left forefoot and chronic osteomyelitis of the left calcaneus. Found to have GNR bacteremia with final culture data pending. Currently on day 3 of Zosyn and vancomycin for treatment.  Going for left transtibial amputation today. Awaiting final speciation of GNR from blood cultures taken on admission.   Plan: 1. Continue vancomycin 1 gram IV with IHD on MWF 2. Continue Zosyn 3.375 grams IV every 12 hours (infused over 4 hours) 3. ? Stop vancomycin after sx with GNR in blood cultures   Height: 5\' 4"  (162.6 cm) Weight: 248 lb 14.4 oz (112.9 kg) IBW/kg (Calculated) : 54.7  Temp (24hrs), Avg:98.2 F (36.8 C), Min:97.6 F (36.4 C), Max:98.8 F (37.1 C)   Recent Labs Lab 02/14/17 0957 02/14/17 1009 02/14/17 1307 02/15/17 0310 02/16/17 0519 02/16/17 0803  WBC 22.8*  --   --  17.9* 17.8*  --   CREATININE 5.63*  --   --  4.28*  --  5.70*  LATICACIDVEN  --  3.24* 1.97*  --   --   --     Estimated Creatinine Clearance: 12.6 mL/min (A) (by C-G formula based on SCr of 5.7 mg/dL (H)).    Allergies  Allergen Reactions  . Ciprofloxacin Itching  . Epinephrine Palpitations    Antimicrobials this admission: 6/6 Zosyn >>  6/6 vancomycin >>   Microbiology results: 6/6 BCx: GNR 6/6 MRSA PCR: positive   Thank you for allowing pharmacy to be a part of this patient's care.  Pollyann SamplesAndy Clotilda Hafer, PharmD, BCPS 02/16/2017, 8:41 AM

## 2017-02-16 NOTE — Procedures (Signed)
I was present at this dialysis session. I have reviewed the session itself and made appropriate changes.   TDC removed yesterday, appreciate IR assistance.   2K bath. 2L UF. Pt w/o complaints.  For surgery later today.    Filed Weights   02/15/17 0340 02/16/17 0335 02/16/17 0655  Weight: 115.6 kg (254 lb 13.6 oz) 115.2 kg (253 lb 15.5 oz) 112.9 kg (248 lb 14.4 oz)     Recent Labs Lab 02/15/17 0310  NA 130*  K 4.9  CL 90*  CO2 24  GLUCOSE 163*  BUN 30*  CREATININE 4.28*  CALCIUM 8.6*     Recent Labs Lab 02/14/17 0957 02/15/17 0310 02/16/17 0519  WBC 22.8* 17.9* 17.8*  NEUTROABS 20.7*  --   --   HGB 9.1* 7.6* 8.6*  HCT 28.9* 25.1* 27.7*  MCV 101.4* 101.2* 100.4*  PLT 240 215 215    Scheduled Meds: . aspirin EC  81 mg Oral Daily  . chlorhexidine  60 mL Topical Once  . Chlorhexidine Gluconate Cloth  6 each Topical Q0600  . docusate sodium  100 mg Oral Daily  . heparin  5,000 Units Subcutaneous Q8H  . insulin aspart  0-9 Units Subcutaneous TID WC  . insulin glargine  20 Units Subcutaneous BID  . levothyroxine  88 mcg Oral QAC breakfast  . lidocaine  5 mL Subcutaneous Once  . midodrine  10 mg Oral Once per day on Mon Wed Fri  . midodrine  10 mg Oral Q M,W,F-HD  . mupirocin ointment  1 application Nasal BID  . sevelamer carbonate  2,400 mg Oral TID WC   Continuous Infusions: . piperacillin-tazobactam (ZOSYN)  IV Stopped (02/16/17 0248)  . vancomycin Stopped (02/14/17 1907)   PRN Meds:.acetaminophen **OR** acetaminophen, albuterol, diphenhydrAMINE, lidocaine, ondansetron **OR** ondansetron (ZOFRAN) IV, oxyCODONE-acetaminophen, senna   Kimberly Heckyan Simaya Lumadue  MD 02/16/2017, 7:50 AM

## 2017-02-16 NOTE — Interval H&P Note (Signed)
History and Physical Interval Note:  02/16/2017 6:51 AM  [name]  has presented today for surgery, with the diagnosis of Gangrene Left Foot  The various methods of treatment have been discussed with the patient and family. After consideration of risks, benefits and other options for treatment, the patient has consented to  Procedure(s): Left Below Knee Amputation (Left) as a surgical intervention .  The patient's history has been reviewed, patient examined, no change in status, stable for surgery.  I have reviewed the patient's chart and labs.  Questions were answered to the patient's satisfaction.     Nadara MustardMarcus V Duda

## 2017-02-16 NOTE — Clinical Social Work Note (Signed)
Clinical Social Work Assessment  Patient Details  Name: Kimberly [name] MRN: [id number] Date of Birth: Aug 01, 1956  Date of referral:  02/16/17               Reason for consult:  Facility Placement                Permission sought to share information with:  Facility Medical sales representativeContact Representative, Family Supports Permission granted to share information::  Yes, Verbal Permission Granted  Name::     Teacher, adult educationJames  Agency::  SNFs  Relationship::  Brother  Contact Information:  [phone]  Housing/Transportation Living arrangements for the past 2 months:  Single Family Home Source of Information:  Other (Comment Required) (Brother) Patient Interpreter Needed:  None Criminal Activity/Legal Involvement Pertinent to Current Situation/Hospitalization:  No - Comment as needed Significant Relationships:  Siblings, Spouse Lives with:  Spouse Do you feel safe going back to the place where you live?  No Need for family participation in patient care:  Yes (Comment)  Care giving concerns:  CSW received consult for possible SNF placement at time of discharge. CSW spoke with patient's brother while patient went to surgery. Patient provided permission. Patient's brother expressed concerns regarding the care patient's husband ability to provide care at home. Patient's brother requested info on becoming patient's HCPOA and durable POA since his sister would like him to take over the decisions from her husband. CSW directed him to appropriate resources. After rehab, he believes patient will require long term care. Patient receives disability but due to her spouse's job, has been unable to qualify for Medicaid. CSW encouraged patient's brother to help her apply again now that she has disibility. CSW to continue to follow and assist with discharge planning needs.   Social Worker assessment / plan:  CSW spoke with patient's brother [facility].  Employment status:  Disabled  (Comment on whether or not currently receiving Disability) Insurance information:  Medicare PT Recommendations:  Skilled Nursing Facility Information / Referral to community resources:  Skilled Nursing Facility  Patient/Family's Response to care:  Patient's brother recognizes need for rehab before returning home and [place] Patient has been to several before, most recently [facility].  Patient/Family's Understanding of and Emotional Response to Diagnosis, Current Treatment, and Prognosis:  Patient/family is realistic regarding therapy needs and expressed being hopeful for SNF placement. Patient's brother expressed understanding of CSW role and discharge process and hopes that patient's surgery will go well. No questions/concerns about plan or treatment.    Emotional Assessment Appearance:  Appears stated age Attitude/Demeanor/Rapport:  Other (Appropriate) Affect (typically observed):  Accepting, Appropriate Orientation:  Oriented to Self, Oriented to Place, Oriented to  Time, Oriented to Situation Alcohol / Substance use:  Not Applicable Psych involvement (Current and /or in the community):  No (Comment)  Discharge Needs  Concerns to be addressed:  Care Coordination Readmission within the last 30 days:  No Current discharge risk:  Physical Impairment Barriers to Discharge:  Continued Medical Work up   [employer] S [name], LCSWA 02/16/2017, 5:07 PM

## 2017-02-16 NOTE — Progress Notes (Signed)
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  The pt experienced acute worsening of the foot w/ erythema and drainage, and was instructed to come to the ED for evaluation   Subjective: 6/8  A/O 4, positive left lower extremity pain, positive chronic respiratory failure (SOB): States on 2 L O2 at home. States bedbound except for a will to get around in her wheelchair. Has hospital bed with no gel overlay.   Assessment & Plan:   Active Problems:   DM (diabetes mellitus), type 2 with renal complications (HCC)   Hyperlipidemia associated with type 2 diabetes mellitus (HCC)   Hypertensive heart disease   CAD (coronary artery disease), native coronary artery   COPD (chronic obstructive pulmonary disease) (HCC)   Chronic diastolic CHF (congestive heart failure) (HCC)   Chronic respiratory failure with hypoxia (HCC)   On home oxygen therapy   Hypothyroidism   Morbid obesity (HCC)   Obesity hypoventilation syndrome (HCC)   Paroxysmal atrial fibrillation (HCC)   MRSA carrier   Cellulitis of leg   Cellulitis of left foot   Sepsis (HCC)   Gangrene of left foot (HCC)   Congestive heart failure (CHF) (HCC)   Diabetes mellitus with complication (HCC)   Left diabetic foot chronic wound - forefoot gangrene and calcaneus osteomyelitis -Dr. Lajoyce Cornersuda to perform transtibial amputation today at approximately 1600.   Positive Gram-negative rod bacteremia (CITROBACTER KOSERI )  -Felt to be due to above  - dialysis catheter out  - continue antibiotic therapy: Susceptibilities pending   ESRD on HD M/W/F Nephrology following - for  dialysis in a.m. prior to OR  Chronic hypotension -Increase Midodrine  10 mg daily  Chronic diastolic CHF grade 2 -Strict in and out -Daily weight -Volume management per HD  CAD -No SSCP/anginal sx   Chronic Respiratory Failure with Hypoxia/Obesity Hypoventilation syndrome/COPD -Titrate O2 to maintain SPO2 89-93%  Anemia of chronic kidney disease -Transfuse to assure Hgb > 8.0  - plan to give 1U PRBC in HD 6/8 if Hgb remains below 8.0 on recheck   DM type 2 controlled with CKD -09/2016 Hemoglobin A1c = 6.6 -Lantus 20 units BID -Sensitive SSI  Sacral decubitus ulcer stage II -Place sacral foam pad -KinAir bed on rotation at all times  Hypothyroidism -Synthroid  88 g daily  MRSA screen positive     DVT prophylaxis: Subcutaneous heparin Code Status:  RN Britta MccreedyBarbara spoke with patient at length and patient wishes to be made DO NOT RESUSCITATE Family Communication: no family present at time of exam  Disposition Plan: SDU   Consultants:  Surgery Dr. Lajoyce Cornersuda    Procedures/Significant Events:     VENTILATOR SETTINGS:    Cultures 6/6 blood positive CITROBACTER KOSERI 6/6 MRSA by PCR positive  Antimicrobials: Anti-infectives    Start     Stop   02/16/17 1200  vancomycin (VANCOCIN) IVPB 1000 mg/200 mL premix  Status:  Discontinued     02/14/17 1533   02/16/17 1200  vancomycin (VANCOCIN) IVPB 1000 mg/200 mL premix  Status:  Discontinued     02/15/17 1041   02/14/17 2200  [MAR Hold]  piperacillin-tazobactam (ZOSYN) IVPB 3.375 g     (  MAR Hold since 02/16/17 1504)       02/14/17 1545  [MAR Hold]  vancomycin (VANCOCIN) IVPB 1000 mg/200 mL premix     (MAR Hold since 02/16/17 1504)       02/14/17 1100  piperacillin-tazobactam (ZOSYN) IVPB 3.375 g     02/14/17 1129   02/14/17 1100  vancomycin (VANCOCIN) IVPB 1000 mg/200 mL premix  Status:  Discontinued     02/14/17 1048   02/14/17 1100  vancomycin (VANCOCIN) 2,000 mg in sodium chloride 0.9 % 500 mL IVPB      02/14/17 1334       Devices    LINES / TUBES:      Continuous Infusions: . piperacillin-tazobactam (ZOSYN)  IV Stopped (02/16/17 0248)  . vancomycin Stopped (02/14/17 1907)     Objective: Vitals:   02/16/17 0900 02/16/17 0915 02/16/17 0930 02/16/17 1000  BP: (!) 113/44 (!) 121/57 (!) 120/43 (!) 112/53  Pulse: 86 85 85 82  Resp: (!) 26     Temp: 97.5 F (36.4 C) 97.6 F (36.4 C)    TempSrc: Oral     SpO2:      Weight:      Height:        Intake/Output Summary (Last 24 hours) at 02/16/17 1046 Last data filed at 02/16/17 0915  Gross per 24 hour  Intake              675 ml  Output                0 ml  Net              675 ml   Filed Weights   02/15/17 0340 02/16/17 0335 02/16/17 0655  Weight: 254 lb 13.6 oz (115.6 kg) 253 lb 15.5 oz (115.2 kg) 248 lb 14.4 oz (112.9 kg)    Examination:  General: A/O 4, positive chronic respiratory distress Eyes: negative scleral hemorrhage, negative anisocoria, negative icterus ENT: Negative Runny nose, negative gingival bleeding, Neck:  Negative scars, masses, torticollis, lymphadenopathy, JVD Lungs: Clear to auscultation bilaterally without wheezes or crackles Cardiovascular: Regular rate and rhythm without murmur gallop or rub normal S1 and S2 Abdomen: MORBIDLY OBESE, negative abdominal pain, nondistended, positive soft, bowel sounds, no rebound, no ascites, no appreciable mass Extremities: bilateral lower extremity atrophy, left foot gangrene  Skin: Negative rashes, lesions, ulcers Psychiatric:  Negative depression, negative anxiety, negative fatigue, negative mania  Central nervous system:  Cranial nerves II through XII intact, tongue/uvula midline, bilateral upper extremity muscle strength 5/5, sensation intact throughout, negative dysarthria, negative expressive aphasia, negative receptive aphasia.  .     Data Reviewed: Care during the described time interval was provided by me .  I have reviewed this patient's  available data, including medical history, events of note, physical examination, and all test results as part of my evaluation. I have personally reviewed and interpreted all radiology studies.  CBC:  Recent Labs Lab 02/14/17 0957 02/15/17 0310 02/16/17 0519  WBC 22.8* 17.9* 17.8*  NEUTROABS 20.7*  --   --   HGB 9.1* 7.6* 8.6*  HCT 28.9* 25.1* 27.7*  MCV 101.4* 101.2* 100.4*  PLT 240 215 215   Basic Metabolic Panel:  Recent Labs Lab 02/14/17 0957 02/14/17 1132 02/15/17 0310 02/16/17 0803  NA 124*  --  130* 129*  K 5.6*  --  4.9 5.1  CL 88*  --  90* 92*  CO2 20*  --  24 22  GLUCOSE 400*  --  163* 145*  BUN 48*  --  30* 49*  CREATININE 5.63*  --  4.28* 5.70*  CALCIUM 9.2  --  8.6* 9.0  MG  --  2.0  --   --   PHOS  --   --   --  7.0*   GFR: Estimated Creatinine Clearance: 12.6 mL/min (A) (by C-G formula based on SCr of 5.7 mg/dL (H)). Liver Function Tests:  Recent Labs Lab 02/14/17 0957 02/15/17 0310 02/16/17 0803  AST 25 26  --   ALT 26 23  --   ALKPHOS 303* 306*  --   BILITOT 0.9 1.4*  --   PROT 8.8* 7.7  --   ALBUMIN 2.6* 2.2* 2.2*   No results for input(s): LIPASE, AMYLASE in the last 168 hours. No results for input(s): AMMONIA in the last 168 hours. Coagulation Profile:  Recent Labs Lab 02/15/17 0310  INR 1.33   Cardiac Enzymes: No results for input(s): CKTOTAL, CKMB, CKMBINDEX, TROPONINI in the last 168 hours. BNP (last 3 results) No results for input(s): PROBNP in the last 8760 hours. HbA1C: No results for input(s): HGBA1C in the last 72 hours. CBG:  Recent Labs Lab 02/14/17 2114 02/15/17 0743 02/15/17 1208 02/15/17 1625 02/15/17 2248  GLUCAP 170* 150* 204* 198* 188*   Lipid Profile: No results for input(s): CHOL, HDL, LDLCALC, TRIG, CHOLHDL, LDLDIRECT in the last 72 hours. Thyroid Function Tests: No results for input(s): TSH, T4TOTAL, FREET4, T3FREE, THYROIDAB in the last 72 hours. Anemia Panel: No results for input(s):  VITAMINB12, FOLATE, FERRITIN, TIBC, IRON, RETICCTPCT in the last 72 hours. Urine analysis:    Component Value Date/Time   COLORURINE RED (A) 07/08/2015 1705   APPEARANCEUR TURBID (A) 07/08/2015 1705   LABSPEC 1.023 07/08/2015 1705   PHURINE 5.0 07/08/2015 1705   GLUCOSEU 100 (A) 07/08/2015 1705   HGBUR MODERATE (A) 07/08/2015 1705   BILIRUBINUR MODERATE (A) 07/08/2015 1705   KETONESUR 15 (A) 07/08/2015 1705   PROTEINUR 100 (A) 07/08/2015 1705   UROBILINOGEN 1.0 07/08/2015 1705   NITRITE POSITIVE (A) 07/08/2015 1705   LEUKOCYTESUR SMALL (A) 07/08/2015 1705   Sepsis Labs: @LABRCNTIP (procalcitonin:4,lacticidven:4)  ) Recent Results (from the past 240 hour(s))  Culture, blood (Routine X 2) w Reflex to ID Panel     Status: None (Preliminary result)   Collection Time: 02/14/17 10:56 AM  Result Value Ref Range Status   Specimen Description BLOOD LEFT HAND  Final   Special Requests IN PEDIATRIC BOTTLE Blood Culture adequate volume  Final   Culture  Setup Time   Final    GRAM NEGATIVE RODS IN PEDIATRIC BOTTLE CRITICAL RESULT CALLED TO, READ BACK BY AND VERIFIED WITH: Merlene Morse PHARMD, AT [phone] 02/15/17 BY D.VANHOOK    Culture   Final    GRAM NEGATIVE RODS IDENTIFICATION AND SUSCEPTIBILITIES TO FOLLOW    Report Status PENDING  Incomplete  Blood Culture ID Panel (Reflexed)     Status: Abnormal   Collection Time: 02/14/17 10:56 AM  Result Value Ref Range Status   Enterococcus species NOT DETECTED NOT DETECTED Final   Listeria monocytogenes NOT DETECTED NOT DETECTED Final   Staphylococcus species NOT DETECTED NOT DETECTED Final   Staphylococcus aureus NOT DETECTED NOT DETECTED Final   Streptococcus species NOT DETECTED NOT DETECTED Final   Streptococcus agalactiae NOT DETECTED NOT DETECTED Final   Streptococcus pneumoniae NOT DETECTED NOT DETECTED Final   Streptococcus pyogenes NOT DETECTED NOT DETECTED Final   Acinetobacter baumannii NOT DETECTED NOT DETECTED Final  Enterobacteriaceae species DETECTED (A) NOT DETECTED Final    Comment: Enterobacteriaceae represent a large family of gram negative bacteria, not a single organism. Refer to culture for further identification. CRITICAL RESULT CALLED TO, READ BACK BY AND VERIFIED WITH: Merlene Morse PHARMD, AT [phone] 02/15/17 BY D.VANHOOK    Enterobacter cloacae complex NOT DETECTED NOT DETECTED Final   Escherichia coli NOT DETECTED NOT DETECTED Final   Klebsiella oxytoca NOT DETECTED NOT DETECTED Final   Klebsiella pneumoniae NOT DETECTED NOT DETECTED Final   Proteus species NOT DETECTED NOT DETECTED Final   Serratia marcescens NOT DETECTED NOT DETECTED Final   Carbapenem resistance NOT DETECTED NOT DETECTED Final   Haemophilus influenzae NOT DETECTED NOT DETECTED Final   Neisseria meningitidis NOT DETECTED NOT DETECTED Final   Pseudomonas aeruginosa NOT DETECTED NOT DETECTED Final   Candida albicans NOT DETECTED NOT DETECTED Final   Candida glabrata NOT DETECTED NOT DETECTED Final   Candida krusei NOT DETECTED NOT DETECTED Final   Candida parapsilosis NOT DETECTED NOT DETECTED Final   Candida tropicalis NOT DETECTED NOT DETECTED Final  Culture, blood (Routine X 2) w Reflex to ID Panel     Status: None (Preliminary result)   Collection Time: 02/14/17 11:30 AM  Result Value Ref Range Status   Specimen Description BLOOD LEFT HAND  Final   Special Requests   Final    BOTTLES DRAWN AEROBIC AND ANAEROBIC Blood Culture adequate volume   Culture NO GROWTH 2 DAYS  Final   Report Status PENDING  Incomplete  MRSA PCR Screening     Status: Abnormal   Collection Time: 02/14/17  2:26 PM  Result Value Ref Range Status   MRSA by PCR POSITIVE (A) NEGATIVE Final    Comment:        The GeneXpert MRSA Assay (FDA approved for NASAL specimens only), is one component of a comprehensive MRSA colonization surveillance program. It is not intended to diagnose MRSA infection nor to guide or monitor treatment for MRSA  infections. RESULT CALLED TO, READ BACK BY AND VERIFIED WITHRonnald Nian, RN [phone] 02/14/2017 T.TYSOR          Radiology Studies: Ir Removal Tun Cv Cath W/o Fl  Result Date: 02/15/2017 INDICATION: History of end-stage renal disease, now with functional dialysis graft. No longer in need of tunneled dialysis catheter. The dialysis catheter was placed at an outside institution. EXAM: REMOVAL OF TUNNELED HEMODIALYSIS CATHETER MEDICATIONS: None COMPLICATIONS: None immediate. PROCEDURE: Informed written consent was obtained from the patient following an explanation of the procedure, risks, benefits and alternatives to treatment. A time out was performed prior to the initiation of the procedure. Maximal barrier sterile technique was utilized including caps, mask, sterile gowns, sterile gloves, large sterile drape, hand hygiene, and Betadine. 1% lidocaine with epinephrine was injected under sterile conditions along the subcutaneous tunnel. Utilizing a combination of blunt dissection and gentle traction, the catheter was removed intact. Hemostasis was obtained with manual compression. A dressing was placed. The patient tolerated the procedure well without immediate post procedural complication. IMPRESSION: Successful removal of tunneled dialysis catheter. Electronically Signed   By: Simonne Come M.D.   On: 02/15/2017 14:06   Dg Chest Portable 1 View  Result Date: 02/14/2017 CLINICAL DATA:  Chest pain. EXAM: PORTABLE CHEST 1 VIEW COMPARISON:  09/11/2016 FINDINGS: Double-lumen dialysis catheter in place, unchanged. There is slight haziness in the lungs suggesting mild interstitial edema. Pulmonary vascularity is normal. No effusions. No pneumothorax. IMPRESSION: Possible mild interstitial edema primarily on the left.  Electronically Signed   By: Francene Boyers M.D.   On: 02/14/2017 13:08   Dg Foot Complete Left  Result Date: 02/14/2017 CLINICAL DATA:  Left foot abscess.  Diabetes. EXAM: LEFT FOOT - COMPLETE 3+  VIEW COMPARISON:  None. FINDINGS: There is extensive air in the soft tissues of left foot consistent with gas gangrene. Some bone detail is obscured by the gas. The gas is most prominent at the level of the metatarsal phalangeal joints with does extend into the midfoot. There is no discrete osteomyelitis. IMPRESSION: Extensive gas gangrene.  No discrete osteomyelitis. Electronically Signed   By: Francene Boyers M.D.   On: 02/14/2017 13:10        Scheduled Meds: . aspirin EC  81 mg Oral Daily  . chlorhexidine  60 mL Topical Once  . Chlorhexidine Gluconate Cloth  6 each Topical Q0600  . docusate sodium  100 mg Oral Daily  . heparin  5,000 Units Subcutaneous Q8H  . insulin aspart  0-9 Units Subcutaneous TID WC  . insulin glargine  20 Units Subcutaneous BID  . levothyroxine  88 mcg Oral QAC breakfast  . lidocaine  5 mL Subcutaneous Once  . midodrine  10 mg Oral Q M,W,F-HD  . mupirocin ointment  1 application Nasal BID  . sevelamer carbonate  2,400 mg Oral TID WC   Continuous Infusions: . piperacillin-tazobactam (ZOSYN)  IV Stopped (02/16/17 0248)  . vancomycin Stopped (02/14/17 1907)     LOS: 2 days    Time spent: 40 minutes    Naliya Gish, Roselind Messier, MD [facility] Pager [phone]   If 7PM-7AM, please contact night-coverage [email or web address] Password [email or web address] 02/16/2017, 10:46 AM

## 2017-02-16 NOTE — Progress Notes (Signed)
Patient would like a DNR.  Patient states she would also like brother to be Medical Power of [phone].  We will proceed with Advanced Directive paperwork on Monday since patient is preparing now for surgery.  Patient states her pastor has already come by an prayed.    02/16/17 1405  Clinical Encounter Type  Visited With Patient;Family  Visit Type Initial;Spiritual support;Social support;Pre-op  Stress Factors  Patient Stress Factors Health changes  Family Stress Factors Health changes

## 2017-02-16 NOTE — Transfer of Care (Signed)
Immediate Anesthesia Transfer of Care Note  Patient: Kimberly [name]  Procedure(s) Performed: Procedure(s): Left Below Knee Amputation (Left)  Patient Location: PACU  Anesthesia Type:General  Level of Consciousness: awake, alert  and oriented  Airway & Oxygen Therapy: Patient Spontanous Breathing and Patient connected to face mask oxygen  Post-op Assessment: Report given to RN and Post -op Vital signs reviewed and stable  Post vital signs: Reviewed and stable  Last Vitals:  Vitals:   02/16/17 1443 02/16/17 1758  BP: 102/68 (!) 103/58  Pulse:  (!) 103  Resp:  (!) 21  Temp: 36.8 C     Last Pain:  Vitals:   02/16/17 1443  TempSrc: Oral  PainSc:          Complications: No apparent anesthesia complications

## 2017-02-16 NOTE — Anesthesia Procedure Notes (Signed)
Procedure Name: Intubation Date/Time: 02/16/2017 4:47 PM Performed by: Izola PriceOCKFIELD JR, Kaianna Dolezal WALTON Pre-anesthesia Checklist: Patient identified, Emergency Drugs available, Suction available and Patient being monitored Patient Re-evaluated:Patient Re-evaluated prior to inductionOxygen Delivery Method: Circle system utilized Preoxygenation: Pre-oxygenation with 100% oxygen Intubation Type: IV induction Ventilation: Mask ventilation without difficulty Laryngoscope Size: Glidescope and 4 Grade View: Grade II Tube type: Oral Tube size: 7.0 mm Number of attempts: 1 Airway Equipment and Method: Stylet and Oral airway Placement Confirmation: ETT inserted through vocal cords under direct vision,  positive ETCO2 and breath sounds checked- equal and bilateral Secured at: 22 cm Tube secured with: Tape Dental Injury: Teeth and Oropharynx as per pre-operative assessment

## 2017-02-16 NOTE — Op Note (Signed)
   Date of Surgery: 02/16/2017  INDICATIONS: Kimberly [name] is a 61 y.o.-year-old female who has gangrene and osteomyelitis left foot.  PREOPERATIVE DIAGNOSIS: Gangrene osteomyelitis left foot  POSTOPERATIVE DIAGNOSIS: Same.  PROCEDURE: Transtibial amputation Application of Prevena wound VAC  SURGEON: Lajoyce Cornersuda, M.D.  ANESTHESIA:  general  IV FLUIDS AND URINE: See anesthesia.  ESTIMATED BLOOD LOSS: Minimal mL. Tourniquet time 4 minutes  COMPLICATIONS: None.  DESCRIPTION OF PROCEDURE: The patient was brought to the operating room and underwent a general anesthetic. After adequate levels of anesthesia were obtained patient's lower extremity was prepped using DuraPrep draped into a sterile field. A timeout was called. The foot was draped out of the sterile field with impervious stockinette. A transverse incision was made 11 cm distal to the tibial tubercle. This curved proximally and a large posterior flap was created. The tibia was transected 1 cm proximal to the skin incision. The fibula was transected just proximal to the tibial incision. The tibia was beveled anteriorly. A large posterior flap was created. The sciatic nerve was pulled cut and allowed to retract. The vascular bundles were suture ligated with 2-0 silk. The deep and superficial fascial layers were closed using #1 Vicryl. The skin was closed using staples and 2-0 nylon. The wound was covered with a Prevena wound VAC. There was a good suction fit. Patient was extubated taken to the PACU in stable condition.  Kimberly BakerMarcus Amine Adelson, MD [facility] 5:40 PM

## 2017-02-16 NOTE — NC FL2 (Signed)
[place] MEDICAID FL2 LEVEL OF CARE SCREENING TOOL     IDENTIFICATION  Patient Name: Kimberly [name] Birthdate: 1955/10/12 Sex: female Admission Date (Current Location): 02/14/2017  [facility] and [place] Number:  Producer, television/film/video and Address:  [facility], [address] [place], [place] [address]      Provider Number: [id number]  Attending Physician Name and Address:  Drema Dallas, MD  Relative Name and Phone Number:       Current Level of Care: Hospital Recommended Level of Care: Skilled Nursing Facility Prior Approval Number:    Date Approved/Denied:   PASRR Number: [id number]  Discharge Plan: SNF    Current Diagnoses: Patient Active Problem List   Diagnosis Date Noted  . Gangrene of left foot (HCC)   . Congestive heart failure (CHF) (HCC)   . Diabetes mellitus with complication (HCC)   . Cellulitis of left foot 02/14/2017  . Sepsis (HCC) 02/14/2017  . Diabetes mellitus, type II, insulin dependent (HCC)   . Pressure injury of skin 08/29/2016  . Cellulitis of leg 08/29/2016  . Chest pain 08/28/2016  . Elevated troponin 08/28/2016  . 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 Hyperkalemia 08/23/2016  . Shortness of breath 08/23/2016  . Hyponatremia 08/23/2016  . Hypotension arterial 09/30/2015  . LBBB (left bundle branch block) 09/30/2015  . Complex endometrial hyperplasia with atypia 09/02/2015  . Severe obesity (HCC) 07/29/2015  . Hypotension 07/20/2015  . Uncontrolled type 2 diabetes mellitus (HCC) 07/20/2015  . Vaginal bleeding   . Altered mental status   . Pulmonary arterial hypertension (HCC)   . Paroxysmal atrial fibrillation (HCC) 07/11/2015  . ESRD (end stage renal disease) (HCC) 07/08/2015  . Diabetic ulcer of right foot associated with diabetes mellitus due to underlying condition (HCC)   . Pressure ulcer 04/07/2015  . Obesity hypoventilation syndrome (HCC) 08/05/2014  . Morbid obesity (HCC) 06/28/2014  . Physical deconditioning  06/28/2014  . Hypothyroidism 06/10/2014  . Carotid stenosis 12/02/2013  . Varicose veins of lower extremities with other complications 12/02/2013  . Chronic respiratory failure with hypoxia (HCC) 10/13/2013  . On home oxygen therapy 10/13/2013  . Chronic diastolic CHF (congestive heart failure) (HCC) 09/12/2013  . CAD (coronary artery disease), native coronary artery   . DM (diabetes mellitus), type 2 with renal complications (HCC) 05/31/2010  . Hyperlipidemia associated with type 2 diabetes mellitus (HCC) 05/31/2010  . COPD (chronic obstructive pulmonary disease) (HCC) 05/31/2010  . Hypertensive heart disease     Orientation RESPIRATION BLADDER Height & Weight     Self, Time, Situation, Place  O2 (Nasal cannula 2L) Continent Weight: 110.9 kg (244 lb 7.8 oz) Height:  5\' 4"  (162.6 cm)  BEHAVIORAL SYMPTOMS/MOOD NEUROLOGICAL BOWEL NUTRITION STATUS      Incontinent Diet (Please see DC Summary)  AMBULATORY STATUS COMMUNICATION OF NEEDS Skin   Extensive Assist Verbally PU Stage and Appropriate Care (Stage II on sacrum; incision on leg)                       Personal Care Assistance Level of Assistance  Bathing, Feeding, Dressing Bathing Assistance: Maximum assistance Feeding assistance: Limited assistance Dressing Assistance: Maximum assistance     Functional Limitations Info             SPECIAL CARE FACTORS FREQUENCY  PT (By licensed PT), OT (By licensed OT)     PT Frequency: 5x/week OT Frequency: 3x/week  Contractures      Additional Factors Info  Code Status, Allergies Code Status Info: Full Allergies Info: Ciprofloxacin, Epinephrine  Contact: MRSA Insulin: 3x daily with meals           Current Medications (02/16/2017):  This is the current hospital active medication list Current Facility-Administered Medications  Medication Dose Route Frequency Provider Last Rate Last Dose  . 0.9 %  sodium chloride infusion   Intravenous Continuous  Gaynelle Adu, MD 10 mL/hr at 02/16/17 1528    . [MAR Hold] acetaminophen (TYLENOL) tablet 650 mg  650 mg Oral Q6H PRN Marcos Eke, PA-C       Or  . Mitzi Hansen Hold] acetaminophen (TYLENOL) suppository 650 mg  650 mg Rectal Q6H PRN Marcos Eke, PA-C      . [MAR Hold] albuterol (PROVENTIL) (2.5 MG/3ML) 0.083% nebulizer solution 2.5 mg  2.5 mg Inhalation Q6H PRN Marcos Eke, PA-C      . [MAR Hold] aspirin EC tablet 81 mg  81 mg Oral Daily Marcos Eke, PA-C   81 mg at 02/16/17 1157  . [MAR Hold] Chlorhexidine Gluconate Cloth 2 % PADS 6 each  6 each Topical Q0600 Ozella Rocks, MD   6 each at 02/16/17 1211  . [MAR Hold] diphenhydrAMINE (BENADRYL) capsule 25 mg  25 mg Oral Q6H PRN Madelyn Flavors A, MD   25 mg at 02/16/17 0017  . [MAR Hold] docusate sodium (COLACE) capsule 100 mg  100 mg Oral Daily Marcos Eke, PA-C      . [MAR Hold] heparin injection 5,000 Units  5,000 Units Subcutaneous Q8H Marcos Eke, PA-C   5,000 Units at 02/15/17 2248  . [MAR Hold] insulin aspart (novoLOG) injection 0-9 Units  0-9 Units Subcutaneous TID WC Marcos Eke, PA-C   2 Units at 02/15/17 1653  . [MAR Hold] insulin glargine (LANTUS) injection 20 Units  20 Units Subcutaneous BID Marcos Eke, PA-C   20 Units at 02/15/17 2248  . [MAR Hold] levothyroxine (SYNTHROID, LEVOTHROID) tablet 88 mcg  88 mcg Oral QAC breakfast Marcos Eke, PA-C   88 mcg at 02/16/17 1334  . lidocaine (XYLOCAINE) 1 % (with pres) injection    PRN Simonne Come, MD   10 mL at 02/15/17 1349  . [MAR Hold] lidocaine 1 % injection for CIRC 5 mL  5 mL Subcutaneous Once Lars Mage, PA-C      . [MAR Hold] midodrine (PROAMATINE) tablet 10 mg  10 mg Oral Q M,W,F-HD Arita Miss, MD      . Mitzi Hansen Hold] mupirocin ointment (BACTROBAN) 2 % 1 application  1 application Nasal BID Ozella Rocks, MD   1 application at 02/16/17 1156  . [MAR Hold] ondansetron (ZOFRAN) tablet 4 mg  4 mg Oral Q6H PRN Marcos Eke, PA-C       Or   . Mitzi Hansen Hold] ondansetron [facility]) injection 4 mg  4 mg Intravenous Q6H PRN Marlowe Kays E, PA-C   4 mg at 02/16/17 1712  . [MAR Hold] oxyCODONE-acetaminophen (PERCOCET/ROXICET) 5-325 MG per tablet 1-2 tablet  1-2 tablet Oral Q4H PRN Marcos Eke, PA-C      . [MAR Hold] piperacillin-tazobactam (ZOSYN) IVPB 3.375 g  3.375 g Intravenous Q12H Rumbarger, Rachel L, RPH 12.5 mL/hr at 02/16/17 1158 3.375 g at 02/16/17 1658  . [MAR Hold] senna (SENOKOT) tablet 17.2 mg  17.2 mg Oral QHS PRN Marcos Eke, PA-C      . [facility]  Hold] sevelamer carbonate (RENVELA) tablet 2,400 mg  2,400 mg Oral TID WC Marlowe KaysWertman, Sara E, PA-C   2,400 mg at 02/15/17 1246  . [MAR Hold] vancomycin (VANCOCIN) IVPB 1000 mg/200 mL premix  1,000 mg Intravenous Q M,W,F-HD Bajbus, Lauren D, RPH 200 mL/hr at 02/16/17 1338 1,000 mg at 02/16/17 1338   Facility-Administered Medications Ordered in Other Encounters  Medication Dose Route Frequency Provider Last Rate Last Dose  . etomidate (AMIDATE) injection    Anesthesia Intra-op Cockfield, Gwendolyn LimaFlynn Walton Jr., CRNA   10 mg at 02/16/17 1645  . fentaNYL (SUBLIMAZE) injection   Intravenous Anesthesia Intra-op Cockfield, Gwendolyn LimaFlynn Walton Jr., CRNA   100 mcg at 02/16/17 1705  . lidocaine 2% (20 mg/mL) 5 mL syringe   Intravenous Anesthesia Intra-op Cockfield, Gwendolyn LimaFlynn Walton Jr., CRNA   40 mg at 02/16/17 1645  . PHENYLephrine 40 mcg/ml in normal saline Adult IV Push Syringe   Intravenous Anesthesia Intra-op Cockfield, Gwendolyn LimaFlynn Walton Jr., CRNA   200 mcg at 02/16/17 1715  . succinylcholine (ANECTINE) syringe   Intravenous Anesthesia Intra-op Cockfield, Gwendolyn LimaFlynn Walton Jr., CRNA   80 mg at 02/16/17 1645     Discharge Medications: Please see discharge summary for a list of discharge medications.  Relevant Imaging Results:  Relevant Lab Results:   Additional Information ss#[id number].  [facility]  Mearl LatinNadia S Ambrosio Reuter, LCSWA

## 2017-02-16 NOTE — H&P (View-Only) (Signed)
ORTHOPAEDIC CONSULTATION  REQUESTING PHYSICIAN: Lonia Blood, MD  Chief Complaint: Gangrene left foot  HPI: Kimberly [name] is a 61 y.o. female who presents with gangrene of left forefoot and chronic osteomyelitis of the left calcaneus. Patient states that she has been seen at the wound center with Dr. Leanord Hawking. She states she is undergone serial debridements for the ulcer over the calcaneus. Patient states that she recently went to the wound center and was referred to the emergency room for the gangrene of the forefoot.  Past Medical History:  Diagnosis Date  . Anemia   . Anxiety   . Arthritis    "knees, feet, hands" (08/23/2016)  . Asthma   . CAD, NATIVE VESSEL    May 10, 2010 cath showed a hyperdynamic LV function, she had dominant circumflex anatomy with a 70-80% small OM1. She had diffuse diabetic plaque particularly in the distal LAD. She nondominant RCA.  Nondominant  . Cellulitis 10/15/2013  . CHF (congestive heart failure) (HCC)    Preserved EF  . Chronic bronchitis (HCC)    "probably once/ yr" (08/23/2016)  . Complication of anesthesia    "I've had difficulty waking up" (08/23/2016)  . COPD   . Depression   . Diabetic neuropathy (HCC)   . Dyspnea   . Endometrial hyperplasia   . ESRD (end stage renal disease) on dialysis (HCC)    "[facility]; MWF; [facility]" (08/23/2016)  . Family history of adverse reaction to anesthesia    mother had hard time waking up  . GERD (gastroesophageal reflux disease)   . History of hiatal hernia   . HYPERLIPIDEMIA   . HYPERTENSION   . Hypothyroidism   . Myocardial infarct, old   . On home oxygen therapy    "2L; 24/7" (08/23/2016)  . Paralyzed vocal cords   . Peripheral neuropathy    hx/notes 01/27/2010  . Pneumonia "several times"  . Proliferative retinopathy    hx/notes 01/27/2010  . PVD    CEA  . Restless leg syndrome    "mostly on the right" (08/23/2016)  . 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 Type II diabetes mellitus (HCC)    Past Surgical History:  Procedure Laterality Date  . AV FISTULA PLACEMENT Right 07/08/2015   Procedure: EXPLORATION RIGHT AXILLARY ARTERY AND RIGHT BRACHIAL VEIN;  Surgeon: Chuck Hint, MD;  Location: [facility] OR;  Service: Vascular;  Laterality: Right;  . AV FISTULA PLACEMENT Right 01/02/2017   Procedure: INSERTION OF ARTERIOVENOUS GORE-TEX  GRAFT  RIGHT ARM;  Surgeon: Chuck Hint, MD;  Location: [facility] OR;  Service: Vascular;  Laterality: Right;  . CAROTID ENDARTERECTOMY Left X 2  . CATARACT EXTRACTION W/ INTRAOCULAR LENS  IMPLANT, BILATERAL Bilateral   . CERVICAL BIOPSY  ~ 2015   "precancerous cells"  . COLONOSCOPY    . EYE SURGERY Bilateral    numerous surgeries  . INSERTION OF DIALYSIS CATHETER N/A 06/24/2015   Procedure: ULTRASOUND BILATERAL INTERNAL JUGULAR VEIN INSERTION OF DIALYSIS CATHETER LEFT INTERNAL JUGULAR VEIN ;  Surgeon: Pryor Ochoa, MD;  Location: [facility] OR;  Service: Vascular;  Laterality: N/A;  . INTRAUTERINE DEVICE INSERTION  ~ 2015  . VITRECTOMY Bilateral    Social History   Social History  . Marital status: Married    Spouse name: N/A  . Number of children: 0  . Years of education:  N/A   Occupational History  . disabled    Social History Main Topics  . Smoking status: Former Smoker    Packs/day: 1.00    Years: 20.00    Types: Cigarettes    Quit date: 09/11/2005  . Smokeless tobacco: Never Used  . Alcohol use No  . Drug use: No  . Sexual activity: No   Other Topics Concern  . None   Social History Narrative  . None   Family History  Problem Relation Age of Onset  . Cancer Mother        Breast, NHL  . Stroke Mother   . Peripheral vascular disease Father   . CAD Father 11  . Heart attack Father   . Hypertension Father   . Asthma Father   . Heart disease Father        before age 11   - negative except  otherwise stated in the family history section Allergies  Allergen Reactions  . Ciprofloxacin Itching  . Epinephrine Palpitations   Prior to Admission medications   Medication Sig Start Date End Date Taking? Authorizing Provider  albuterol (PROVENTIL HFA;VENTOLIN HFA) 108 (90 Base) MCG/ACT inhaler Inhale 2 puffs into the lungs every 6 (six) hours as needed for wheezing or shortness of breath.   Yes [provider]  aspirin EC 81 MG tablet Take 81 mg by mouth daily.   Yes [provider]  hydrocerin (EUCERIN) CREA Apply 1 application topically 2 (two) times daily. 07/20/15  Yes Dhungel, Nishant, MD  insulin glargine (LANTUS) 100 UNIT/ML injection Inject 0.4 mLs (40 Units total) into the skin 2 (two) times daily. 08/31/16  Yes Dorothea Ogle, MD  insulin lispro (HUMALOG) 100 UNIT/ML injection Inject 0-30 Units into the skin 3 (three) times daily before meals. Sliding Scale (also depends on what patient is eating) 100-150 = 10 units 151-200=10-15 units 201-299=20 units >300 =30 units   Yes [provider]  levothyroxine (SYNTHROID, LEVOTHROID) 88 MCG tablet Take 1 tablet (88 mcg total) by mouth daily before breakfast. 10/10/16  Yes Ngetich, Dinah C, NP  midodrine (PROAMATINE) 10 MG tablet Take 1 tablet (10 mg total) by mouth 2 (two) times daily with a meal. Patient taking differently: Take 10 mg by mouth 3 (three) times a week. Takes 30 minutes prior to dialysis on Monday, Wednesday and Friday 07/20/15  Yes Dhungel, Nishant, MD  Multiple Vitamins-Minerals (MULTIVITAMIN ADULT) CHEW Chew 1 tablet by mouth daily.   Yes [provider]  OXYGEN 2lpm 24/7- [facility]   Yes [provider]  docusate sodium (COLACE) 100 MG capsule Take 1 capsule (100 mg total) by mouth daily. Patient taking differently: Take 100 mg by mouth daily as needed for mild constipation.  10/10/16   Ngetich, Dinah C, NP  levonorgestrel (MIRENA) 20 MCG/24HR IUD 1 Intra Uterine Device (1 each  total) by Intrauterine route once. 08/16/15   Adolphus Birchwood, MD  oxyCODONE-acetaminophen (ROXICET) 5-325 MG tablet Take 1-2 tablets by mouth every 4 (four) hours as needed. Patient not taking: Reported on 02/14/2017 01/02/17   Chuck Hint, MD  sevelamer carbonate (RENVELA) 2.4 g PACK Take 2.4 g by mouth 3 (three) times daily with meals. Patient not taking: Reported on 02/14/2017 08/31/16   Dorothea Ogle, MD   Dg Chest Portable 1 View  Result Date: 02/14/2017 CLINICAL DATA:  Chest pain. EXAM: PORTABLE CHEST 1 VIEW COMPARISON:  09/11/2016 FINDINGS: Double-lumen dialysis catheter in place, unchanged. There is slight haziness in the lungs  suggesting mild interstitial edema. Pulmonary vascularity is normal. No effusions. No pneumothorax. IMPRESSION: Possible mild interstitial edema primarily on the left. Electronically Signed   By: Francene BoyersJames  Maxwell M.D.   On: 02/14/2017 13:08   Dg Foot Complete Left  Result Date: 02/14/2017 CLINICAL DATA:  Left foot abscess.  Diabetes. EXAM: LEFT FOOT - COMPLETE 3+ VIEW COMPARISON:  None. FINDINGS: There is extensive air in the soft tissues of left foot consistent with gas gangrene. Some bone detail is obscured by the gas. The gas is most prominent at the level of the metatarsal phalangeal joints with does extend into the midfoot. There is no discrete osteomyelitis. IMPRESSION: Extensive gas gangrene.  No discrete osteomyelitis. Electronically Signed   By: Francene BoyersJames  Maxwell M.D.   On: 02/14/2017 13:10   - pertinent xrays, CT, MRI studies were reviewed and independently interpreted  Positive ROS: All other systems have been reviewed and were otherwise negative with the exception of those mentioned in the HPI and as above.  Physical Exam: General: Alert, no acute distress Psychiatric: Patient is competent for consent with normal mood and affect Lymphatic: No axillary or cervical lymphadenopathy Cardiovascular: No pedal edema Respiratory: No cyanosis, no use of accessory  musculature GI: No organomegaly, abdomen is soft and non-tender  Skin:  Examination patient has dry gangrene left forefoot. The toes are black ischemic and cold. She has a black ulcer on the plantar aspect of the metatarsal heads. She has a chronic Wagner grade 3 ulcer of the left calcaneus with exposed calcaneus.   Neurologic: Patient does not have protective sensation bilateral lower extremities.   MUSCULOSKELETAL:  Patient has brawny skin changes to the left lower extremity with lymphedema and venous insufficiency. There are no leg ulcers. She does not have a palpable dorsalis pedis pulse.  Assessment: Assessment: Diabetic insensate neuropathy end-stage renal disease on dialysis with peripheral vascular disease and gangrene of the left forefoot and osteomyelitis of the left calcaneus.  Plan: Plan: We will plan for left transtibial amputation. Patient will still need an ankle-brachial indices. If the transtibial amputation does not heal patient will need further workup with vascular surgery.  Plan for left transtibial amputation tomorrow Friday afternoon, patient will need to have her dialysis completed prior to noon.  Thank you for the consult and the opportunity to see Ms. Elouise Munroerews  Marcus Duda, MD [facility] [phone] 7:09 AM

## 2017-02-17 DIAGNOSIS — J449 Chronic obstructive pulmonary disease, unspecified: Secondary | ICD-10-CM

## 2017-02-17 DIAGNOSIS — Z794 Long term (current) use of insulin: Secondary | ICD-10-CM

## 2017-02-17 DIAGNOSIS — R0603 Acute respiratory distress: Secondary | ICD-10-CM

## 2017-02-17 DIAGNOSIS — E1121 Type 2 diabetes mellitus with diabetic nephropathy: Secondary | ICD-10-CM

## 2017-02-17 LAB — MAGNESIUM: MAGNESIUM: 2.2 mg/dL (ref 1.7–2.4)

## 2017-02-17 LAB — CBC WITH DIFFERENTIAL/PLATELET
BASOS PCT: 1 %
Basophils Absolute: 0.1 10*3/uL (ref 0.0–0.1)
Eosinophils Absolute: 0.1 10*3/uL (ref 0.0–0.7)
Eosinophils Relative: 1 %
HEMATOCRIT: 30.9 % — AB (ref 36.0–46.0)
HEMOGLOBIN: 9.7 g/dL — AB (ref 12.0–15.0)
LYMPHS ABS: 1.2 10*3/uL (ref 0.7–4.0)
Lymphocytes Relative: 9 %
MCH: 31.8 pg (ref 26.0–34.0)
MCHC: 31.4 g/dL (ref 30.0–36.0)
MCV: 101.3 fL — ABNORMAL HIGH (ref 78.0–100.0)
MONOS PCT: 7 %
Monocytes Absolute: 0.9 10*3/uL (ref 0.1–1.0)
NEUTROS ABS: 11 10*3/uL — AB (ref 1.7–7.7)
NEUTROS PCT: 82 %
Platelets: 216 10*3/uL (ref 150–400)
RBC: 3.05 MIL/uL — AB (ref 3.87–5.11)
RDW: 17.1 % — ABNORMAL HIGH (ref 11.5–15.5)
WBC: 13.3 10*3/uL — AB (ref 4.0–10.5)

## 2017-02-17 LAB — BASIC METABOLIC PANEL
Anion gap: 17 — ABNORMAL HIGH (ref 5–15)
BUN: 36 mg/dL — ABNORMAL HIGH (ref 6–20)
CHLORIDE: 92 mmol/L — AB (ref 101–111)
CO2: 23 mmol/L (ref 22–32)
Calcium: 9.1 mg/dL (ref 8.9–10.3)
Creatinine, Ser: 4.67 mg/dL — ABNORMAL HIGH (ref 0.44–1.00)
GFR calc Af Amer: 11 mL/min — ABNORMAL LOW (ref >60)
GFR calc non Af Amer: 9 mL/min — ABNORMAL LOW (ref >60)
Glucose, Bld: 210 mg/dL — ABNORMAL HIGH (ref 65–99)
POTASSIUM: 4.9 mmol/L (ref 3.5–5.1)
SODIUM: 132 mmol/L — AB (ref 135–145)

## 2017-02-17 LAB — CULTURE, BLOOD (ROUTINE X 2): Special Requests: ADEQUATE

## 2017-02-17 LAB — BPAM RBC
BLOOD PRODUCT EXPIRATION DATE: 201806132359
ISSUE DATE / TIME: 201806080830
UNIT TYPE AND RH: 5100

## 2017-02-17 LAB — TYPE AND SCREEN
ABO/RH(D): O POS
Antibody Screen: NEGATIVE
Unit division: 0

## 2017-02-17 LAB — GLUCOSE, CAPILLARY
GLUCOSE-CAPILLARY: 191 mg/dL — AB (ref 65–99)
Glucose-Capillary: 199 mg/dL — ABNORMAL HIGH (ref 65–99)
Glucose-Capillary: 189 mg/dL — ABNORMAL HIGH (ref 65–99)
Glucose-Capillary: 179 mg/dL — ABNORMAL HIGH (ref 65–99)

## 2017-02-17 MED ORDER — INSULIN GLARGINE 100 UNIT/ML ~~LOC~~ SOLN
24.0000 [IU] | Freq: Two times a day (BID) | SUBCUTANEOUS | Status: DC
  Administered 2017-02-17 – 2017-02-20 (×6): 24 [IU] via SUBCUTANEOUS
  Filled 2017-02-17 (×8): qty 0.24

## 2017-02-17 MED ORDER — POLYETHYLENE GLYCOL 3350 17 G PO PACK
17.0000 g | PACK | Freq: Every day | ORAL | Status: DC
  Administered 2017-02-17 – 2017-02-19 (×2): 17 g via ORAL
  Filled 2017-02-17 (×4): qty 1

## 2017-02-17 MED ORDER — INSULIN GLARGINE 100 UNIT/ML ~~LOC~~ SOLN: 26.0000 [IU] | Freq: Two times a day (BID) | SUBCUTANEOUS | Status: DC

## 2017-02-17 MED ORDER — RENA-VITE PO TABS
1.0000 | ORAL_TABLET | Freq: Every day | ORAL | Status: DC
  Administered 2017-02-19: 1 via ORAL
  Filled 2017-02-17 (×2): qty 1

## 2017-02-17 MED ORDER — CEFTRIAXONE SODIUM 2 G IJ SOLR
2.0000 g | INTRAMUSCULAR | Status: DC
  Administered 2017-02-17 – 2017-02-20 (×4): 2 g via INTRAVENOUS
  Filled 2017-02-17 (×4): qty 2

## 2017-02-17 MED ORDER — PRO-STAT SUGAR FREE PO LIQD
30.0000 mL | Freq: Two times a day (BID) | ORAL | Status: DC
  Administered 2017-02-17 – 2017-02-19 (×3): 30 mL via ORAL
  Filled 2017-02-17 (×6): qty 30

## 2017-02-17 MED ORDER — ALBUTEROL SULFATE (2.5 MG/3ML) 0.083% IN NEBU: 2.5000 mg | INHALATION_SOLUTION | RESPIRATORY_TRACT | Status: DC | PRN

## 2017-02-17 NOTE — Anesthesia Postprocedure Evaluation (Signed)
Anesthesia Post Note  Patient: Kimberly ManilaSusan K [name]  Procedure(s) Performed: Procedure(s) (LRB): Left Below Knee Amputation (Left)     Patient location during evaluation: PACU Anesthesia Type: General Level of consciousness: awake and alert Pain management: pain level controlled Vital Signs Assessment: post-procedure vital signs reviewed and stable Respiratory status: spontaneous breathing, nonlabored ventilation, respiratory function stable and patient connected to nasal cannula oxygen Cardiovascular status: blood pressure returned to baseline and stable Postop Assessment: no signs of nausea or vomiting Anesthetic complications: no    Last Vitals:  Vitals:   02/17/17 0316 02/17/17 0734  BP: (!) 105/55 103/74  Pulse: 91   Resp: 16   Temp: 36.6 C 36.4 C    Last Pain:  Vitals:   02/17/17 1027  TempSrc:   PainSc: 10-Worst pain ever                 Brannon Decaire,W. EDMOND

## 2017-02-17 NOTE — Progress Notes (Addendum)
Kimberly [name] - Stepdown/ICU TEAM  Kimberly [name]  Kimberly [name] DOB: [birth year] Kimberly [name] Kimberly [name], [name], Kimberly [name]    Brief Narrative:   61 y.o. 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  The pt experienced acute worsening of the foot w/ erythema and drainage, and was instructed to come to the ED for evaluation.     Subjective: Resting comfortably at the time of my visit.  No respiratory distress or uncontrolled pain.    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 Lajoyce Cornersuda - wound care per Ortho - appears stable at this time   Citrobacter koseri bacteremia Felt to be due to above - dialysis catheter removed 6/7 - continue antibiotic therapy for prolonged course to assure clearance  ESRD on HD M/W/F Nephrology following - to cont HD on schedule   Chronic hypotension Continue midodrine - stable at this time   CAD Stable clinically    Anemia of chronic kidney disease Transfuse to assure Hgb 8.0 or > prior to OR - plan to give 1U PRBC in HD 6/8 if Hgb remains below 8.0 on recheck   DM2 CBG slightly elevated - gently adjust insulin and follow  Grade 2 Chronic diastolic congestive heart failure Volume management per HD - no evidence of volume overload    Chronic hypoxic respiratory failure - obesity hypoventilation - COPD Well compensated at this time   Hypothyroidism Cont synthroid  MRSA screen +  DVT prophylaxis: Subcutaneous heparin Code Status: FULL CODE Family Communication: no family present at time of exam  Disposition Plan: transfer to renal bed - rehab per Ortho   Consultants:  Nephrology Orthopedics  Procedures: none  Antimicrobials:  Zosyn 6/6 > Vancomycin 6/6 > 6/8  Objective: Blood pressure (!) 114/99, pulse 91, temperature 97.5 F (36.4 C), temperature source Oral,  resp. rate 20, height 5\' 4"  ([name].626 m), weight 104.3 kg (230 lb), SpO2 100 %.  Intake/Output Summary (Last 24 hours) at 02/17/17 1238 Last data filed at 02/17/17 0659  Gross per 24 hour  Intake              570 ml  Output              101 ml  Net              469 ml   Filed Weights   02/16/17 0655 02/16/17 1105 02/17/17 0316  Weight: 112.9 kg (248 lb 14.4 oz) 110.9 kg (244 lb 7.8 oz) 104.3 kg (230 lb)    Examination: General: No acute respiratory distress  Lungs: distant breath sounds in all fields - no wheezing  Cardiovascular: RRR Abdomen: obese,no rebound  Extremities: L leg surgical wound on vac - R LE w/o signif edema  CBC:  Recent Labs Lab 02/14/17 0957 02/15/17 0310 02/16/17 0519 02/16/17 1618 02/17/17 0354  WBC 22.8* 17.9* 17.8*  --  13.3*  NEUTROABS 20.7*  --   --   --  11.0*  HGB 9.[name]* 7.6* 8.6* 10.9* 9.7*  HCT 28.9* 25.[name]* 27.7* 32.0* 30.9*  MCV 101.4* 101.2* 100.4*  --  101.3*  PLT 240 215 215  --  216   Basic Metabolic Panel:  Recent Labs Lab 02/14/17 0957 02/14/17 1132 02/15/17 0310 02/16/17 0803 02/16/17 1618 02/17/17 0354  NA 124*  --  130*  129* 130* 132*  K 5.6*  --  4.9 5.[name] 4.4 4.9  CL 88*  --  90* 92*  --  92*  CO2 20*  --  24 22  --  23  GLUCOSE 400*  --  163* 145* 154* 210*  BUN 48*  --  30* 49*  --  36*  CREATININE 5.63*  --  4.28* 5.70*  --  4.67*  CALCIUM 9.2  --  8.6* 9.0  --  9.[name]  MG  --  2.0  --   --   --  2.2  PHOS  --   --   --  7.0*  --   --    GFR: Estimated Creatinine Clearance: 14.7 mL/min (A) (by C-G formula based on SCr of 4.67 mg/dL (H)).  Liver Function Tests:  Recent Labs Lab 02/14/17 0957 02/15/17 0310 02/16/17 0803  AST 25 26  --   ALT 26 23  --   ALKPHOS 303* 306*  --   BILITOT 0.9 [name].4*  --   PROT 8.8* 7.7  --   ALBUMIN 2.6* 2.2* 2.2*    Coagulation Profile:  Recent Labs Lab 02/15/17 0310  INR [name].33    HbA1C: Hemoglobin A1C  Date/Time Value Ref Range Status  10/06/2016 6.6  Final   Hgb A1c MFr  Bld  Date/Time Value Ref Range Status  08/23/2016 01:00 PM 9.9 (H) 4.8 - 5.6 % Final    Comment:    (NOTE)         Pre-diabetes: 5.7 - 6.4         Diabetes: >6.4         Glycemic control for adults with diabetes: <7.0   04/15/2015 05:44 AM 8.7 (H) 4.8 - 5.6 % Final    Comment:    (NOTE)         Pre-diabetes: 5.7 - 6.4         Diabetes: >6.4         Glycemic control for adults with diabetes: <7.0     CBG:  Recent Labs Lab 02/16/17 1440 02/16/17 1803 02/16/17 2207 02/17/17 0732 02/17/17 1153  GLUCAP 116* 138* 183* 191* 199*    Recent Results (from the past 240 hour(s))  Culture, blood (Routine X 2) w Reflex to ID Panel     Status: Abnormal   Collection Time: 02/14/17 10:56 AM  Result Value Ref Range Status   Specimen Description BLOOD LEFT HAND  Final   Special Requests IN PEDIATRIC BOTTLE Blood Culture adequate volume  Final   Culture  Setup Time   Final    GRAM NEGATIVE RODS IN PEDIATRIC BOTTLE CRITICAL RESULT CALLED TO, READ BACK BY AND VERIFIED WITHMerlene Morse PHARMD, AT [phone] 02/15/17 BY D.VANHOOK    Culture CITROBACTER KOSERI (A)  Final   Report Status 02/17/2017 FINAL  Final   Organism ID, Bacteria CITROBACTER KOSERI  Final      Susceptibility   Citrobacter koseri - MIC*    CEFAZOLIN <=4 SENSITIVE Sensitive     CEFEPIME <=[name] SENSITIVE Sensitive     CEFTAZIDIME <=[name] SENSITIVE Sensitive     CEFTRIAXONE <=[name] SENSITIVE Sensitive     CIPROFLOXACIN <=0.25 SENSITIVE Sensitive     GENTAMICIN <=[name] SENSITIVE Sensitive     IMIPENEM <=0.25 SENSITIVE Sensitive     TRIMETH/SULFA <=20 SENSITIVE Sensitive     PIP/TAZO <=4 SENSITIVE Sensitive     * CITROBACTER KOSERI  Blood Culture ID Panel (Reflexed)     Status: Abnormal  Collection Time: 02/14/17 10:56 AM  Result Value Ref Range Status   Enterococcus species NOT DETECTED NOT DETECTED Final   Listeria monocytogenes NOT DETECTED NOT DETECTED Final   Staphylococcus species NOT DETECTED NOT DETECTED Final   Staphylococcus  aureus NOT DETECTED NOT DETECTED Final   Streptococcus species NOT DETECTED NOT DETECTED Final   Streptococcus agalactiae NOT DETECTED NOT DETECTED Final   Streptococcus pneumoniae NOT DETECTED NOT DETECTED Final   Streptococcus pyogenes NOT DETECTED NOT DETECTED Final   Acinetobacter baumannii NOT DETECTED NOT DETECTED Final   Enterobacteriaceae species DETECTED (A) NOT DETECTED Final    Comment: Enterobacteriaceae represent a large family of gram negative bacteria, not a single organism. Refer to culture for further identification. CRITICAL RESULT CALLED TO, READ BACK BY AND VERIFIED WITH: Merlene Morse PHARMD, AT [phone] 02/15/17 BY D.VANHOOK    Enterobacter cloacae complex NOT DETECTED NOT DETECTED Final   Escherichia coli NOT DETECTED NOT DETECTED Final   Klebsiella oxytoca NOT DETECTED NOT DETECTED Final   Klebsiella pneumoniae NOT DETECTED NOT DETECTED Final   Proteus species NOT DETECTED NOT DETECTED Final   Serratia marcescens NOT DETECTED NOT DETECTED Final   Carbapenem resistance NOT DETECTED NOT DETECTED Final   Haemophilus influenzae NOT DETECTED NOT DETECTED Final   Neisseria meningitidis NOT DETECTED NOT DETECTED Final   Pseudomonas aeruginosa NOT DETECTED NOT DETECTED Final   Candida albicans NOT DETECTED NOT DETECTED Final   Candida glabrata NOT DETECTED NOT DETECTED Final   Candida krusei NOT DETECTED NOT DETECTED Final   Candida parapsilosis NOT DETECTED NOT DETECTED Final   Candida tropicalis NOT DETECTED NOT DETECTED Final  Culture, blood (Routine X 2) w Reflex to ID Panel     Status: None (Preliminary result)   Collection Time: 02/14/17 11:30 AM  Result Value Ref Range Status   Specimen Description BLOOD LEFT HAND  Final   Special Requests   Final    BOTTLES DRAWN AEROBIC AND ANAEROBIC Blood Culture adequate volume   Culture NO GROWTH 2 DAYS  Final   Report Status PENDING  Incomplete  MRSA PCR Screening     Status: Abnormal   Collection Time: 02/14/17  2:26 PM  Result  Value Ref Range Status   MRSA by PCR POSITIVE (A) NEGATIVE Final    Comment:        The GeneXpert MRSA Assay (FDA approved for NASAL specimens only), is one component of a comprehensive MRSA colonization surveillance program. It is not intended to diagnose MRSA infection nor to guide or monitor treatment for MRSA infections. RESULT CALLED TO, READ BACK BY AND VERIFIED WITH: Ronnald Nian, RN [phone] 02/14/2017 T.TYSOR      Scheduled Meds: . aspirin EC  81 mg Oral Daily  . Chlorhexidine Gluconate Cloth  6 each Topical Q0600  . docusate sodium  100 mg Oral Daily  . docusate sodium  100 mg Oral BID  . feeding supplement (PRO-STAT SUGAR FREE 64)  30 mL Oral BID  . heparin  5,000 Units Subcutaneous Q8H  . insulin aspart  0-9 Units Subcutaneous TID WC  . insulin glargine  20 Units Subcutaneous BID  . levothyroxine  88 mcg Oral QAC breakfast  . lidocaine  5 mL Subcutaneous Once  . midodrine  10 mg Oral Daily  . multivitamin  [name] tablet Oral QHS  . mupirocin ointment  [name] application Nasal BID  . sevelamer carbonate  2,400 mg Oral TID WC     LOS: 3 days   Lonia Blood,  Kimberly [name] [facility] Office  [phone] Pager - Text Page per Loretha Stapler as per below:  On-Call/Text Page:      [email or web address]      password [email or web address]  If 7PM-7AM, please contact night-coverage [email or web address] Password [facility] 02/17/2017, 12:38 PM

## 2017-02-17 NOTE — Progress Notes (Signed)
[facility] Progress Note  Subjective:  Had transtibial amp yesterday wound VAC in place. Pain controlled  Trouble swallowing and could not get positioned well in new bariatric bed  HD yesterday no issues net UF 2L  Brother Onalee HuaDavid present has some concerns about pt dispo    Objective Vitals:   02/16/17 2022 02/16/17 2329 02/17/17 0316 02/17/17 0734  BP: (!) 113/52 (!) 90/49 (!) 105/55 103/74  Pulse: 95 92 91   Resp: (!) 22 (!) 27 16   Temp: 97.5 F (36.4 C) 97.5 F (36.4 C) 97.8 F (36.6 C) 97.5 F (36.4 C)  TempSrc: Oral Oral Oral Oral  SpO2: 100% 96% 100%   Weight:   104.3 kg (230 lb)   Height:       Physical Exam General: Obese chronically-ill appearing WF wearing nasal oxygen Heart: RRR Lungs: CTAB anteriorly  Abdomen: soft bs+ NT Extremities: L BKA wound VAC in place, R leg chronic venous changes  Dialysis Access: RUE AVG +bruit   D[facility] MWF 4.5h   113.5kg   2/2.25 bath   TDC (now removed)/ AVG RUE   Hep 6000 w 5000 midrun prn VDRA: none EPO: Miercera 75mcg q2wk, last 5/30 IV Fe: Currently has rec 3/5 Venofer 100mg  qTx1 Midodrine used preHD   Assessment/Plan: 1. 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 GNR Bacteremia - BC+ Citrobacter koseri likely sec to #1 - on Vanc/Zosyn - TDC removed 6/7 3. ESRD - MWF Next HD Monday 6/11 - hold heparin using AVG on cannulation protocol  4. Leukocytosis 2/2 #2 - improving  5. Anemia - hgb 9.7 next ESA due 6/13, mircera 75mcg q2wk; hold IV Fe with active infection 6. Hypotension/volume - on midodrine pre HD/ tolerating UF net 2L  Post wt 110.9kg yesterday - continue UF as tolerated bed weights variable but anticipate weight loss w BKA/poor po intake  7. 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 Nutrition -Renal diet/vitamins/ prostat for low albumin  10. CAD, Afib,  dCHF 11. DM 12. Dispo - Family believes that husband no longer able to care for her at home. SNF planning per SW    Tomasa Blasegechi Grace Ejigiri PA-C [facility] Pager [phone] 02/17/2017,11:37 AM  LOS: 3 days   Pt seen, examined and agree w A/P as above.  Vinson Moselleob Evagelia Knack MD [facility] pager [phone]   02/17/2017, 2:04 PM    Additional Objective Labs: Basic Metabolic Panel:  Recent Labs Lab 02/15/17 0310 02/16/17 0803 02/16/17 1618 02/17/17 0354  NA 130* 129* 130* 132*  K 4.9 5.1 4.4 4.9  CL 90* 92*  --  92*  CO2 24 22  --  23  GLUCOSE 163* 145* 154* 210*  BUN 30* 49*  --  36*  CREATININE 4.28* 5.70*  --  4.67*  CALCIUM 8.6* 9.0  --  9.1  PHOS  --  7.0*  --   --    Liver Function Tests:  Recent Labs Lab 02/14/17 0957 02/15/17 0310 02/16/17 0803  AST 25 26  --   ALT 26 23  --   ALKPHOS 303* 306*  --   BILITOT 0.9 1.4*  --   PROT 8.8* 7.7  --   ALBUMIN 2.6* 2.2* 2.2*   No results for input(s): LIPASE, AMYLASE in the last 168 hours. CBC:  Recent Labs Lab 02/14/17 0957 02/15/17  0310 02/16/17 0519 02/16/17 1618 02/17/17 0354  WBC 22.8* 17.9* 17.8*  --  13.3*  NEUTROABS 20.7*  --   --   --  11.0*  HGB 9.1* 7.6* 8.6* 10.9* 9.7*  HCT 28.9* 25.1* 27.7* 32.0* 30.9*  MCV 101.4* 101.2* 100.4*  --  101.3*  PLT 240 215 215  --  216   Blood Culture    Component Value Date/Time   SDES BLOOD LEFT HAND 02/14/2017 1130   SPECREQUEST  02/14/2017 1130    BOTTLES DRAWN AEROBIC AND ANAEROBIC Blood Culture adequate volume   CULT NO GROWTH 2 DAYS 02/14/2017 1130   REPTSTATUS PENDING 02/14/2017 1130    Cardiac Enzymes: No results for input(s): CKTOTAL, CKMB, CKMBINDEX, TROPONINI in the last 168 hours. CBG:  Recent Labs Lab 02/16/17 1205 02/16/17 1440 02/16/17 1803 02/16/17 2207 02/17/17 0732  GLUCAP 107* 116* 138* 183* 191*   Iron Studies: No results for input(s): IRON, TIBC, TRANSFERRIN, FERRITIN in the last 72 hours. Lab Results   Component Value Date   INR 1.33 02/15/2017   INR 1.33 07/20/2015   INR 1.35 07/19/2015   Medications: . sodium chloride 10 mL/hr at 02/16/17 1528  . sodium chloride 10 mL/hr at 02/17/17 0642  . methocarbamol (ROBAXIN)  IV    . piperacillin-tazobactam (ZOSYN)  IV 3.375 g (02/17/17 0936)  . vancomycin 1,000 mg (02/16/17 1338)   . aspirin EC  81 mg Oral Daily  . Chlorhexidine Gluconate Cloth  6 each Topical Q0600  . docusate sodium  100 mg Oral Daily  . docusate sodium  100 mg Oral BID  . heparin  5,000 Units Subcutaneous Q8H  . insulin aspart  0-9 Units Subcutaneous TID WC  . insulin glargine  20 Units Subcutaneous BID  . levothyroxine  88 mcg Oral QAC breakfast  . lidocaine  5 mL Subcutaneous Once  . midodrine  10 mg Oral Daily  . mupirocin ointment  1 application Nasal BID  . sevelamer carbonate  2,400 mg Oral TID WC

## 2017-02-17 NOTE — Progress Notes (Signed)
Patient ID: Kimberly [name], female   DOB: [birth year], 61 y.o.   MRN: [id number] Patient is postoperative day 1 left transtibial amputation. Patient denies any pain. Examination the wound VAC is functioning well. The canister is empty. Anticipate patient will need discharge to skilled nursing. She will discharge with the portable Prevena wound VAC pump attached to the dressing.

## 2017-02-17 NOTE — Progress Notes (Signed)
Patient to transfer to [phone] report given to receiving nurse Tresa EndoKelly, all questions answered at this time.  Pt. VSS with no s/s of distress noted.  Patient stable for transfer.

## 2017-02-18 ENCOUNTER — Inpatient Hospital Stay (HOSPITAL_COMMUNITY): Payer: Medicare Other

## 2017-02-18 ENCOUNTER — Encounter (HOSPITAL_COMMUNITY): Payer: Self-pay | Admitting: Orthopedic Surgery

## 2017-02-18 DIAGNOSIS — N186 End stage renal disease: Secondary | ICD-10-CM

## 2017-02-18 DIAGNOSIS — E118 Type 2 diabetes mellitus with unspecified complications: Secondary | ICD-10-CM

## 2017-02-18 DIAGNOSIS — I5032 Chronic diastolic (congestive) heart failure: Secondary | ICD-10-CM

## 2017-02-18 DIAGNOSIS — I2510 Atherosclerotic heart disease of native coronary artery without angina pectoris: Secondary | ICD-10-CM

## 2017-02-18 DIAGNOSIS — J9611 Chronic respiratory failure with hypoxia: Secondary | ICD-10-CM

## 2017-02-18 LAB — GLUCOSE, CAPILLARY
GLUCOSE-CAPILLARY: 319 mg/dL — AB (ref 65–99)
GLUCOSE-CAPILLARY: 182 mg/dL — AB (ref 65–99)
Glucose-Capillary: 157 mg/dL — ABNORMAL HIGH (ref 65–99)
Glucose-Capillary: 214 mg/dL — ABNORMAL HIGH (ref 65–99)

## 2017-02-18 LAB — CBC
HEMATOCRIT: 31.9 % — AB (ref 36.0–46.0)
HEMOGLOBIN: 9.8 g/dL — AB (ref 12.0–15.0)
MCH: 31.3 pg (ref 26.0–34.0)
MCHC: 30.7 g/dL (ref 30.0–36.0)
MCV: 101.9 fL — ABNORMAL HIGH (ref 78.0–100.0)
Platelets: 223 10*3/uL (ref 150–400)
RBC: 3.13 MIL/uL — AB (ref 3.87–5.11)
RDW: 17.2 % — ABNORMAL HIGH (ref 11.5–15.5)
WBC: 13.4 10*3/uL — ABNORMAL HIGH (ref 4.0–10.5)

## 2017-02-18 MED ORDER — SEVELAMER CARBONATE 2.4 G PO PACK
2.4000 g | PACK | Freq: Three times a day (TID) | ORAL | Status: DC
  Administered 2017-02-20: 2.4 g via ORAL
  Filled 2017-02-18 (×2): qty 1

## 2017-02-18 MED ORDER — NALOXONE HCL 0.4 MG/ML IJ SOLN
INTRAMUSCULAR | Status: AC
  Filled 2017-02-18: qty 1

## 2017-02-18 MED ORDER — NALOXONE HCL 0.4 MG/ML IJ SOLN
0.4000 mg | Freq: Once | INTRAMUSCULAR | Status: AC
  Administered 2017-02-18: 0.4 mg via INTRAVENOUS

## 2017-02-18 NOTE — Evaluation (Signed)
Physical Therapy Re-Evaluation Patient Details Name: Kimberly [name] MRN: [id number] DOB: 04/26/1956 Today's Date: 02/18/2017   History of Present Illness  Patient is a 61 y/o female who presents with gangrene of left foot. Now s/p transtibial amputation 02/16/17; PMH includes morbid obesity, DM, PVD, diabetic neuropathy, depression, stroke, chronic diastolic HF, COPD, HTN, CAD.  Clinical Impression  Patient is s/p above surgery resulting in functional limitations due to the deficits listed below (see PT Problem List). Presents with decr mobility and difficulty with lateral scooting; Agreeable with SNF for post-acute rehab at this point;  Patient will benefit from skilled PT to increase their independence and safety with mobility to allow discharge to the venue listed below.    Request that pt go to HD in HD chair; recommend using Maximove to lift to the chair at this point.     Follow Up Recommendations SNF    Equipment Recommendations  None recommended by PT    Recommendations for Other Services       Precautions / Restrictions Precautions Precautions: Fall Restrictions LLE Weight Bearing: Non weight bearing      Mobility  Bed Mobility Overal bed mobility: Needs Assistance Bed Mobility: Rolling;Sidelying to Sit;Sit to Supine Rolling: Mod assist;+2 for physical assistance Sidelying to sit: Mod assist;+2 for physical assistance;HOB elevated   Sit to supine: Max assist;+2 for physical assistance   General bed mobility comments: Step by step cues for technique and log roll technique. Very anxious re: pain and fear of falling with movement;  Use of rail for support. Assist to bring LEs off bed, and to elevate trunk. Assist to bring LEs to return to supine.   Transfers Overall transfer level: Needs assistance   Transfers: Lateral/Scoot Transfers          Lateral/Scoot Transfers: Total assist;+2 physical assistance General transfer comment: Pt declining OOB to chair transfer;  simulated lateral scooting at EOB; difficulty with scooting due to decr ability to weight shift and unweigh hips for scooting  Ambulation/Gait                Stairs            Wheelchair Mobility    Modified Rankin (Stroke Patients Only)       Balance     Sitting balance-Leahy Scale: Poor                                       Pertinent Vitals/Pain Pain Assessment: Faces Faces Pain Scale: Hurts even more Pain Location: LLE with movement, especially during transitions Pain Descriptors / Indicators: Sore;Grimacing;Guarding Pain Intervention(s): Monitored during session    Home Living Family/patient expects to be discharged to:: Private residence Living Arrangements: Spouse/significant other Available Help at Discharge: Family;Available PRN/intermittently Type of Home: House Home Access: Ramped entrance     Home Layout: One level Home Equipment: Walker - 2 wheels;Wheelchair - Engineer, technical sales - power;Bedside commode;Hospital bed Additional Comments: 2L O2 at home    Prior Function Level of Independence: Needs assistance   Gait / Transfers Assistance Needed: RW for short distance ambulation. Has w/c and power chair for household and community mobility. Uses lift chair.  ADL's / Homemaking Assistance Needed: husband assisting with ADL, only sponge bathing        Hand Dominance   Dominant Hand: Right    Extremity/Trunk Assessment   Upper Extremity Assessment Upper Extremity Assessment: Generalized weakness (reports numbness bil  hands)    Lower Extremity Assessment Lower Extremity Assessment: LLE deficits/detail;RLE deficits/detail RLE Deficits / Details: Grossly ~3/5 throughout. LLE Deficits / Details: Now s/p BKA with vacuum assisted closure; able to extend L knee in supine; more difficulty with L knee extension in sitting, hamstring tightness LLE: Unable to fully assess due to pain       Communication   Communication: No  difficulties  Cognition Arousal/Alertness: Awake/alert Behavior During Therapy: WFL for tasks assessed/performed;Anxious Overall Cognitive Status: Within Functional Limits for tasks assessed                                 General Comments: Pt emotionally labile, and speaks quite tangentially      General Comments General comments (skin integrity, edema, etc.): Not stable sitting EOB; noting a tendency for LLE to "reach" for the floor when sitting EOB, requiring constant guard for safety; Educated pt in desensitization stimulation for LLE    Exercises Amputee Exercises Knee Extension: Left;Seated;AROM;10 reps Straight Leg Raises: AROM;Left;5 reps;Supine   Assessment/Plan    PT Assessment Patient needs continued PT services  PT Problem List Decreased strength;Decreased mobility;Decreased balance;Pain;Impaired sensation;Decreased activity tolerance;Cardiopulmonary status limiting activity;Obesity;Decreased skin integrity       PT Treatment Interventions Therapeutic activities;Gait training;Therapeutic exercise;Patient/family education;Balance training;Functional mobility training;DME instruction;Wheelchair mobility training    PT Goals (Current goals can be found in the Care Plan section)  Acute Rehab PT Goals Patient Stated Goal: to get stronger and go home PT Goal Formulation: With patient (dc'd walking goal set pre BKA) Time For Goal Achievement: 03/01/17 Potential to Achieve Goals: Fair    Frequency Min 3X/week   Barriers to discharge Decreased caregiver support      Co-evaluation               AM-PAC PT "6 Clicks" Daily Activity  Outcome Measure Difficulty turning over in bed (including adjusting bedclothes, sheets and blankets)?: Total Difficulty moving from lying on back to sitting on the side of the bed? : Total Difficulty sitting down on and standing up from a chair with arms (e.g., wheelchair, bedside commode, etc,.)?: Total Help needed moving  to and from a bed to chair (including a wheelchair)?: Total Help needed walking in hospital room?: Total Help needed climbing 3-5 steps with a railing? : Total 6 Click Score: 6    End of Session Equipment Utilized During Treatment: Oxygen Activity Tolerance: Patient limited by pain;Patient tolerated treatment well Patient left: in bed;with call bell/phone within reach;with bed alarm set Nurse Communication: Mobility status;Need for lift equipment PT Visit Diagnosis: Pain;Muscle weakness (generalized) (M62.81);Other abnormalities of gait and mobility (R26.89) Pain - Right/Left: Left Pain - part of body: Leg    Time: [id number] PT Time Calculation (min) (ACUTE ONLY): 37 min   Charges:   PT Evaluation $PT Re-evaluation: 1 Procedure PT Treatments $Therapeutic Activity: 8-22 mins   PT G Codes:        Van ClinesHolly Lasheka Kempner, PT  Acute Rehabilitation Services Pager [phone] Office [phone]   Levi AlandHolly H Cortnee Steinmiller 02/18/2017, 10:11 AM

## 2017-02-18 NOTE — Progress Notes (Addendum)
[facility] Progress Note  Subjective:  Working on diet - swallowing issues Sitting up in bed. No new c/os  Is lethargic and sat's dropping after recent po narcotics   Objective Vitals:   02/17/17 1155 02/17/17 1620 02/17/17 1857 02/17/17 2214  BP: (!) 114/99 103/62 93/60 (!) 105/48  Pulse:   80 80  Resp: 20  16 17   Temp: 97.5 F (36.4 C) 97 F (36.1 C) 97.6 F (36.4 C) 98 F (36.7 C)  TempSrc: Oral Oral Oral Oral  SpO2:   (!) 87% 99%  Weight:    115.7 kg (255 lb)  Height:    5\' 4"  (1.626 m)   Physical Exam General: Obese chronically-ill appearing WF wearing nasal oxygen Heart: RRR Lungs: CTAB anteriorly  Abdomen: soft bs+ NT Extremities: L BKA wound VAC in place, R leg chronic venous changes  Dialysis Access: RUE AVG +bruit   [facility] 4.5h   113.5kg   2/2.25 bath   TDC (now removed)/ AVG RUE   Hep 6000 w 5000 midrun prn VDRA: none EPO: Miercera 75mcg q2wk, last 5/30 IV Fe: Currently has rec 3/5 Venofer 100mg  qTx1 Midodrine used preHD   Assessment/Plan: 1. 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 GNR Bacteremia - BC 6/6+ Citrobacter koseri likely sec to #1 - now on Rocephin- TDC removed 6/7 3. ESRD - MWF Next HD Monday 6/11 - hold heparin using AVG on cannulation protocol  4. Leukocytosis 2/2 #2 - improving  5. Anemia - hgb 9.7 next ESA due 6/13, mircera 75mcg q2wk; hold IV Fe with active infection 6. Hypotension/volume - on midodrine pre HD/ tolerating UF net 2L  Post HD wt 110.9kg  - continue UF as tolerated bed weights variable but anticipate weight loss w BKA/poor po intake  7. 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 Nutrition -Renal diet/vitamins/ prostat for low albumin  10. CAD, Afib, dCHF 11. DM 13. Dysphasia - pt reports dysphagia following stroke  ~5 years ago - swallow eval in 08/2016 - mild aspiration risk 12. Dispo - Family believes that husband no longer able to care for her at home - SNF planning per SW    Tomasa Blasegechi Grace Ejigiri PA-C [facility] Pager [phone] 02/18/2017,10:30 AM  LOS: 4 days    Pt seen, examined, agree w assess/plan as above with additions as indicated. Looks poor at the moment, not responding well, O2sats dropping after given oral narcotics.  Will give IV narcan and get CXR and EKG.  Lungs are clear on exam.  Would hold all narcotics for now and use Tylenol for pain.  Vinson Moselleob Breanna Shorkey MD [facility] pager [phone]    cell [phone] 02/18/2017, 11:21 AM        Additional Objective Labs: Basic Metabolic Panel:  Recent Labs Lab 02/15/17 0310 02/16/17 0803 02/16/17 1618 02/17/17 0354  NA 130* 129* 130* 132*  K 4.9 5.1 4.4 4.9  CL 90* 92*  --  92*  CO2 24 22  --  23  GLUCOSE 163* 145* 154* 210*  BUN 30* 49*  --  36*  CREATININE 4.28* 5.70*  --  4.67*  CALCIUM 8.6* 9.0  --  9.1  PHOS  --  7.0*  --   --    Liver Function Tests:  Recent Labs Lab 02/14/17  0957 02/15/17 0310 02/16/17 0803  AST 25 26  --   ALT 26 23  --   ALKPHOS 303* 306*  --   BILITOT 0.9 1.4*  --   PROT 8.8* 7.7  --   ALBUMIN 2.6* 2.2* 2.2*   No results for input(s): LIPASE, AMYLASE in the last 168 hours. CBC:  Recent Labs Lab 02/14/17 0957 02/15/17 0310 02/16/17 0519 02/16/17 1618 02/17/17 0354 02/18/17 0344  WBC 22.8* 17.9* 17.8*  --  13.3* 13.4*  NEUTROABS 20.7*  --   --   --  11.0*  --   HGB 9.1* 7.6* 8.6* 10.9* 9.7* 9.8*  HCT 28.9* 25.1* 27.7* 32.0* 30.9* 31.9*  MCV 101.4* 101.2* 100.4*  --  101.3* 101.9*  PLT 240 215 215  --  216 223   Blood Culture    Component Value Date/Time   SDES BLOOD LEFT HAND 02/14/2017 1130   SPECREQUEST  02/14/2017 1130    BOTTLES DRAWN AEROBIC AND ANAEROBIC Blood Culture adequate volume   CULT NO GROWTH 3 DAYS 02/14/2017 1130   REPTSTATUS  PENDING 02/14/2017 1130    Cardiac Enzymes: No results for input(s): CKTOTAL, CKMB, CKMBINDEX, TROPONINI in the last 168 hours. CBG:  Recent Labs Lab 02/17/17 0732 02/17/17 1153 02/17/17 1619 02/17/17 2051 02/18/17 0754  GLUCAP 191* 199* 189* 179* 157*   Iron Studies: No results for input(s): IRON, TIBC, TRANSFERRIN, FERRITIN in the last 72 hours. Lab Results  Component Value Date   INR 1.33 02/15/2017   INR 1.33 07/20/2015   INR 1.35 07/19/2015   Medications: . cefTRIAXone (ROCEPHIN)  IV Stopped (02/17/17 1515)  . methocarbamol (ROBAXIN)  IV     . aspirin EC  81 mg Oral Daily  . docusate sodium  100 mg Oral BID  . feeding supplement (PRO-STAT SUGAR FREE 64)  30 mL Oral BID  . heparin  5,000 Units Subcutaneous Q8H  . insulin aspart  0-9 Units Subcutaneous TID WC  . insulin glargine  24 Units Subcutaneous BID  . levothyroxine  88 mcg Oral QAC breakfast  . midodrine  10 mg Oral Daily  . multivitamin  1 tablet Oral QHS  . mupirocin ointment  1 application Nasal BID  . polyethylene glycol  17 g Oral Daily  . sevelamer carbonate  2.4 g Oral TID WC

## 2017-02-18 NOTE — Progress Notes (Signed)
Galen ManilaSusan K Creger  ZOX:[id number] DOB: 1956/01/05 DOA: 02/14/2017 PCP: [name], [name], MD    Brief Narrative:   61 y.o. 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  The pt experienced acute worsening of the foot w/ erythema and drainage, and was instructed to come to the ED for evaluation.   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 Lajoyce Cornersuda - wound care per Ortho - appears stable at this time  - will discharge with the portable Prevena wound VAC pump attached to the dressing  Citrobacter koseri bacteremia - Felt to be due to above - dialysis catheter removed 6/7 - continue antibiotic therapy for prolonged course to assure clearance  ESRD on HD M/W/F - Nephrology following - to cont HD on schedule , due for dialysis tomorrow  Chronic hypotension - Continue midodrine - stable at this time   CAD - Stable clinically  , denies any chest pain or shortness of breath.  Anemia of chronic kidney disease  - hemoglobin 9.7, on erythropoietin   DM2 - CBG acceptable, continue with exercising scale on current dose Lantus   Grade 2 Chronic diastolic congestive heart failure - Volume management per HD - no evidence of volume overload    Dysphagia - Status post stroke 5 years ago, microscope aspiration, change to dysphagia 1 clear liquid diet.  Hypothyroidism - Continue Synthroid  Chronic hypoxic respiratory failure -  obesity hypoventilation -  COPD  MRSA screen +  Addendum Patient with an episode of lethargy, and desaturation, this is secondary to narcotics, as it resolved after Narcan was given, narcotics has been stopped .   DVT prophylaxis: Subcutaneous heparin Code Status: FULL CODE Family  Communication: no family present at time of exam  Disposition Plan: will needs SNF placement when stable  Consultants:  Nephrology Orthopedics  Procedures: none  Antimicrobials:  Zosyn 6/6 > Vancomycin 6/6 > 6/8  Objective: Blood pressure 133/65, pulse 96, temperature 97.8 F (36.6 C), temperature source Oral, resp. rate 17, height 5\' 4"  (1.626 m), weight 115.7 kg (255 lb), SpO2 100 %.  Intake/Output Summary (Last 24 hours) at 02/18/17 1151 Last data filed at 02/18/17 1107  Gross per 24 hour  Intake          1378.33 ml  Output                0 ml  Net          1378.33 ml   Filed Weights   02/16/17 1105 02/17/17 0316 02/17/17 2214  Weight: 110.9 kg (244 lb 7.8 oz) 104.3 kg (230 lb) 115.7 kg (255 lb)    Examination: General: Awake alert oriented, no apparent distress No acute respiratory distress  Lungs: Good air entry bilaterally,  Cardiovascular: Regular rate and rhythm, no rubs murmurs gallops Abdomen: Soft, nontender, nondistended, bowel sounds present Extremities: L leg surgical wound on vac - R LE w/o signif edema  CBC:  Recent Labs Lab 02/14/17 0957 02/15/17 0310 02/16/17 0519 02/16/17 1618 02/17/17 0354 02/18/17 0344  WBC 22.8* 17.9* 17.8*  --  13.3* 13.4*  NEUTROABS 20.7*  --   --   --  11.0*  --   HGB 9.1* 7.6* 8.6* 10.9* 9.7* 9.8*  HCT 28.9* 25.1* 27.7* 32.0* 30.9* 31.9*  MCV 101.4* 101.2* 100.4*  --  101.3* 101.9*  PLT 240 215 215  --  216 223   Basic Metabolic Panel:  Recent Labs Lab 02/14/17 0957 02/14/17 1132 02/15/17 0310 02/16/17 0803 02/16/17 1618 02/17/17 0354  NA 124*  --  130* 129* 130* 132*  K 5.6*  --  4.9 5.1 4.4 4.9  CL 88*  --  90* 92*  --  92*  CO2 20*  --  24 22  --  23  GLUCOSE 400*  --  163* 145* 154* 210*  BUN 48*  --  30* 49*  --  36*  CREATININE 5.63*  --  4.28* 5.70*  --  4.67*  CALCIUM 9.2  --  8.6* 9.0  --  9.1  MG  --  2.0  --   --   --  2.2  PHOS  --   --   --  7.0*  --   --    GFR: Estimated Creatinine  Clearance: 15.6 mL/min (A) (by C-G formula based on SCr of 4.67 mg/dL (H)).  Liver Function Tests:  Recent Labs Lab 02/14/17 0957 02/15/17 0310 02/16/17 0803  AST 25 26  --   ALT 26 23  --   ALKPHOS 303* 306*  --   BILITOT 0.9 1.4*  --   PROT 8.8* 7.7  --   ALBUMIN 2.6* 2.2* 2.2*    Coagulation Profile:  Recent Labs Lab 02/15/17 0310  INR 1.33    HbA1C: Hemoglobin A1C  Date/Time Value Ref Range Status  10/06/2016 6.6  Final   Hgb A1c MFr Bld  Date/Time Value Ref Range Status  08/23/2016 01:00 PM 9.9 (H) 4.8 - 5.6 % Final    Comment:    (NOTE)         Pre-diabetes: 5.7 - 6.4         Diabetes: >6.4         Glycemic control for adults with diabetes: <7.0   04/15/2015 05:44 AM 8.7 (H) 4.8 - 5.6 % Final    Comment:    (NOTE)         Pre-diabetes: 5.7 - 6.4         Diabetes: >6.4         Glycemic control for adults with diabetes: <7.0     CBG:  Recent Labs Lab 02/17/17 0732 02/17/17 1153 02/17/17 1619 02/17/17 2051 02/18/17 0754  GLUCAP 191* 199* 189* 179* 157*    Recent Results (from the past 240 hour(s))  Culture, blood (Routine X 2) w Reflex to ID Panel     Status: Abnormal   Collection Time: 02/14/17 10:56 AM  Result Value Ref Range Status   Specimen Description BLOOD LEFT HAND  Final   Special Requests IN PEDIATRIC BOTTLE Blood Culture adequate volume  Final   Culture  Setup Time   Final    GRAM NEGATIVE RODS IN PEDIATRIC BOTTLE CRITICAL RESULT CALLED TO, READ BACK BY AND VERIFIED WITHMerlene Morse PHARMD, AT [phone] 02/15/17 BY D.VANHOOK    Culture CITROBACTER KOSERI (A)  Final   Report Status 02/17/2017 FINAL  Final   Organism ID, Bacteria CITROBACTER KOSERI  Final      Susceptibility   Citrobacter koseri - MIC*    CEFAZOLIN <=4 SENSITIVE Sensitive     CEFEPIME <=1 SENSITIVE Sensitive     CEFTAZIDIME <=1 SENSITIVE Sensitive     CEFTRIAXONE <=1 SENSITIVE Sensitive     CIPROFLOXACIN <=  0.25 SENSITIVE Sensitive     GENTAMICIN <=1 SENSITIVE  Sensitive     IMIPENEM <=0.25 SENSITIVE Sensitive     TRIMETH/SULFA <=20 SENSITIVE Sensitive     PIP/TAZO <=4 SENSITIVE Sensitive     * CITROBACTER KOSERI  Blood Culture ID Panel (Reflexed)     Status: Abnormal   Collection Time: 02/14/17 10:56 AM  Result Value Ref Range Status   Enterococcus species NOT DETECTED NOT DETECTED Final   Listeria monocytogenes NOT DETECTED NOT DETECTED Final   Staphylococcus species NOT DETECTED NOT DETECTED Final   Staphylococcus aureus NOT DETECTED NOT DETECTED Final   Streptococcus species NOT DETECTED NOT DETECTED Final   Streptococcus agalactiae NOT DETECTED NOT DETECTED Final   Streptococcus pneumoniae NOT DETECTED NOT DETECTED Final   Streptococcus pyogenes NOT DETECTED NOT DETECTED Final   Acinetobacter baumannii NOT DETECTED NOT DETECTED Final   Enterobacteriaceae species DETECTED (A) NOT DETECTED Final    Comment: Enterobacteriaceae represent a large family of gram negative bacteria, not a single organism. Refer to culture for further identification. CRITICAL RESULT CALLED TO, READ BACK BY AND VERIFIED WITH: Merlene Morse PHARMD, AT [phone] 02/15/17 BY D.VANHOOK    Enterobacter cloacae complex NOT DETECTED NOT DETECTED Final   Escherichia coli NOT DETECTED NOT DETECTED Final   Klebsiella oxytoca NOT DETECTED NOT DETECTED Final   Klebsiella pneumoniae NOT DETECTED NOT DETECTED Final   Proteus species NOT DETECTED NOT DETECTED Final   Serratia marcescens NOT DETECTED NOT DETECTED Final   Carbapenem resistance NOT DETECTED NOT DETECTED Final   Haemophilus influenzae NOT DETECTED NOT DETECTED Final   Neisseria meningitidis NOT DETECTED NOT DETECTED Final   Pseudomonas aeruginosa NOT DETECTED NOT DETECTED Final   Candida albicans NOT DETECTED NOT DETECTED Final   Candida glabrata NOT DETECTED NOT DETECTED Final   Candida krusei NOT DETECTED NOT DETECTED Final   Candida parapsilosis NOT DETECTED NOT DETECTED Final   Candida tropicalis NOT DETECTED NOT  DETECTED Final  Culture, blood (Routine X 2) w Reflex to ID Panel     Status: None (Preliminary result)   Collection Time: 02/14/17 11:30 AM  Result Value Ref Range Status   Specimen Description BLOOD LEFT HAND  Final   Special Requests   Final    BOTTLES DRAWN AEROBIC AND ANAEROBIC Blood Culture adequate volume   Culture NO GROWTH 3 DAYS  Final   Report Status PENDING  Incomplete  MRSA PCR Screening     Status: Abnormal   Collection Time: 02/14/17  2:26 PM  Result Value Ref Range Status   MRSA by PCR POSITIVE (A) NEGATIVE Final    Comment:        The GeneXpert MRSA Assay (FDA approved for NASAL specimens only), is one component of a comprehensive MRSA colonization surveillance program. It is not intended to diagnose MRSA infection nor to guide or monitor treatment for MRSA infections. RESULT CALLED TO, READ BACK BY AND VERIFIED WITH: Ronnald Nian, RN [phone] 02/14/2017 T.TYSOR      Scheduled Meds: . aspirin EC  81 mg Oral Daily  . docusate sodium  100 mg Oral BID  . feeding supplement (PRO-STAT SUGAR FREE 64)  30 mL Oral BID  . heparin  5,000 Units Subcutaneous Q8H  . insulin aspart  0-9 Units Subcutaneous TID WC  . insulin glargine  24 Units Subcutaneous BID  . levothyroxine  88 mcg Oral QAC breakfast  . midodrine  10 mg Oral Daily  . multivitamin  1 tablet Oral QHS  .  mupirocin ointment  1 application Nasal BID  . polyethylene glycol  17 g Oral Daily  . sevelamer carbonate  2.4 g Oral TID WC     LOS: 4 days   Huey Bienenstock, MD Pager [phone] [facility] Office  [phone] Pager - Text Page per Amion as per below:  On-Call/Text Page:      [email or web address]      password [email or web address]  If 7PM-7AM, please contact night-coverage [email or web address] Password [email or web address] 02/18/2017, 11:51 AM

## 2017-02-18 NOTE — Progress Notes (Signed)
Pt called nurse in room as she felt she was having difficulty breathing. Nurse checked pts vital signs and O2 sats were 84 per cent on 2L. Dr Arta SilenceShertz at bedside. 1 amp of narcan ordered and administered at bedside. Pt roused instantly and OZ sats back to 100 percent . Pt doing well, will continue to monitor.

## 2017-02-19 LAB — RENAL FUNCTION PANEL
ALBUMIN: 2.1 g/dL — AB (ref 3.5–5.0)
ANION GAP: 18 — AB (ref 5–15)
BUN: 65 mg/dL — ABNORMAL HIGH (ref 6–20)
CO2: 21 mmol/L — AB (ref 22–32)
Calcium: 8.7 mg/dL — ABNORMAL LOW (ref 8.9–10.3)
Chloride: 93 mmol/L — ABNORMAL LOW (ref 101–111)
Creatinine, Ser: 7.21 mg/dL — ABNORMAL HIGH (ref 0.44–1.00)
GFR calc non Af Amer: 5 mL/min — ABNORMAL LOW (ref >60)
GFR, EST AFRICAN AMERICAN: 6 mL/min — AB (ref >60)
Glucose, Bld: 140 mg/dL — ABNORMAL HIGH (ref 65–99)
PHOSPHORUS: 9.2 mg/dL — AB (ref 2.5–4.6)
Potassium: 5.2 mmol/L — ABNORMAL HIGH (ref 3.5–5.1)
SODIUM: 132 mmol/L — AB (ref 135–145)

## 2017-02-19 LAB — CBC
HCT: 31.4 % — ABNORMAL LOW (ref 36.0–46.0)
HEMOGLOBIN: 9.7 g/dL — AB (ref 12.0–15.0)
MCH: 31.4 pg (ref 26.0–34.0)
MCHC: 30.9 g/dL (ref 30.0–36.0)
MCV: 101.6 fL — ABNORMAL HIGH (ref 78.0–100.0)
Platelets: 239 10*3/uL (ref 150–400)
RBC: 3.09 MIL/uL — AB (ref 3.87–5.11)
RDW: 17 % — ABNORMAL HIGH (ref 11.5–15.5)
WBC: 15.5 10*3/uL — ABNORMAL HIGH (ref 4.0–10.5)

## 2017-02-19 LAB — GLUCOSE, CAPILLARY
GLUCOSE-CAPILLARY: 175 mg/dL — AB (ref 65–99)
GLUCOSE-CAPILLARY: 221 mg/dL — AB (ref 65–99)
Glucose-Capillary: 141 mg/dL — ABNORMAL HIGH (ref 65–99)

## 2017-02-19 LAB — CULTURE, BLOOD (ROUTINE X 2)
Culture: NO GROWTH
Special Requests: ADEQUATE

## 2017-02-19 MED ORDER — HEPARIN SODIUM (PORCINE) 1000 UNIT/ML DIALYSIS: 1000.0000 [IU] | INTRAMUSCULAR | Status: DC | PRN

## 2017-02-19 MED ORDER — MIDODRINE HCL 5 MG PO TABS
ORAL_TABLET | ORAL | Status: AC
  Filled 2017-02-19: qty 2

## 2017-02-19 MED ORDER — PENTAFLUOROPROP-TETRAFLUOROETH EX AERO: 1.0000 "application " | INHALATION_SPRAY | CUTANEOUS | Status: DC | PRN

## 2017-02-19 MED ORDER — LIDOCAINE HCL (PF) 1 % IJ SOLN: 5.0000 mL | INTRAMUSCULAR | Status: DC | PRN

## 2017-02-19 MED ORDER — LIDOCAINE-PRILOCAINE 2.5-2.5 % EX CREA: 1.0000 "application " | TOPICAL_CREAM | CUTANEOUS | Status: DC | PRN

## 2017-02-19 MED ORDER — SODIUM CHLORIDE 0.9 % IV SOLN: 100.0000 mL | INTRAVENOUS | Status: DC | PRN

## 2017-02-19 NOTE — Progress Notes (Signed)
OT Treatment  Pt continues to demonstrate decreased functional performance. Pt performed bed mobility with Max A +2 to roll and place Maxi-Move for transfer to chair. Pt requires VCs for hand placement and sequencing of task. Continue to recommend dc to SNF for further OT to increase pt independence and safety with ADLs and functional mobility. Will continue to follow acutely to facilitate safe dc.    02/19/17 0900  OT Visit Information  Last OT Received On 02/19/17  Assistance Needed +2  History of Present Illness Patient is a 61 y/o female who presents with gangrene of left foot. Now s/p transtibial amputation 02/16/17; PMH includes morbid obesity, DM, PVD, diabetic neuropathy, depression, stroke, chronic diastolic HF, COPD, HTN, CAD.  Precautions  Precautions Fall  Precaution Comments watch BP  Pain Assessment  Pain Assessment Faces  Faces Pain Scale 6  Pain Location LLE with movement, especially during transitions  Pain Descriptors / Indicators Sore;Grimacing;Guarding  Pain Intervention(s) Monitored during session;Limited activity within patient's tolerance;Repositioned  Cognition  Arousal/Alertness Awake/alert  Behavior During Therapy WFL for tasks assessed/performed;Anxious  Overall Cognitive Status Within Functional Limits for tasks assessed  ADL  Overall ADL's  Needs assistance/impaired  General ADL Comments Pt did not perform ADLs at this time. Feel pt is still Max-Total A for LB ADLs due to decreased core strength and balance. Pt transfering from bed to chair to transport to HD  Bed Mobility  Overal bed mobility Needs Assistance  Bed Mobility Rolling;Sidelying to Sit;Sit to Supine  Rolling +2 for physical assistance;Max assist  General bed mobility comments Step by step cues for technique. Pt stating that she is unable to grasp with her hands. Instructed her for hand placement to optimize particiaption  Balance  Overall balance assessment Needs assistance  Sitting-balance  support Feet supported;No upper extremity supported  Sitting balance-Leahy Scale Poor  Restrictions  Weight Bearing Restrictions Yes  LLE Weight Bearing NWB  Transfers  Overall transfer level Needs assistance  Transfer via Lift Equipment Maximove  General transfer comment Maxi-move from bed to chair. Pt used BUE to pull up on maxi-move for better positioning.   Exercises  Exercises Amputee  Amputee Exercises  Knee Extension Left;AROM;Right;5 reps;Supine  Knee Flexion Left;Supine;5 reps;AROM;Right  OT - End of Session  Equipment Utilized During Treatment Oxygen  Activity Tolerance Patient tolerated treatment well  Patient left in chair;with nursing/sitter in room (With HD transport team)  Nurse Communication Mobility status;Need for lift equipment  OT Assessment/Plan  OT Plan Discharge plan remains appropriate  OT Visit Diagnosis Unsteadiness on feet (R26.81);Other abnormalities of gait and mobility (R26.89);History of falling (Z91.81);Muscle weakness (generalized) (M62.81);Pain  Pain - Right/Left Left  Pain - part of body Leg  OT Frequency (ACUTE ONLY) Min 2X/week  Follow Up Recommendations SNF;Supervision/Assistance - 24 hour  OT Equipment None recommended by OT  AM-PAC OT "6 Clicks" Daily Activity Outcome Measure  Help from another person eating meals? 3  Help from another person taking care of personal grooming? 3  Help from another person toileting, which includes using toliet, bedpan, or urinal? 1  Help from another person bathing (including washing, rinsing, drying)? 2  Help from another person to put on and taking off regular upper body clothing? 2  Help from another person to put on and taking off regular lower body clothing? 1  6 Click Score 12  ADL G Code Conversion CL  OT Goal Progression  Progress towards OT goals Progressing toward goals  Acute Rehab OT Goals  Patient Stated  Goal to get stronger and go home  OT Goal Formulation With patient  Time For Goal  Achievement 03/01/17  Potential to Achieve Goals Good  ADL Goals  Pt Will Transfer to Toilet with max assist;stand pivot transfer;bedside commode  Pt/caregiver will Perform Home Exercise Program Increased strength;Both right and left upper extremity;With theraband  Additional ADL Goal #1 Pt will perform bed mobility with min assist as precursor to ADL and mobility.  OT Time Calculation  OT Start Time (ACUTE ONLY) 0754  OT Stop Time (ACUTE ONLY) [id number]  OT Time Calculation (min) 28 min  OT General Charges  $OT Visit 1 Procedure  OT Treatments  $Self Care/Home Management  23-37 mins    Rodnisha Blomgren MSOT, OTR/L Acute Rehab Pager: [phone] Office: [phone]

## 2017-02-19 NOTE — Progress Notes (Signed)
[name] Progress Note  Subjective:  Looks much better, sitting up and alert and conversant.  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    Objective Vitals:   02/18/17 1140 02/18/17 1741 02/18/17 2146 02/19/17 0303  BP: 133/65 114/74 (!) 118/54 (!) 106/54  Pulse: 96 66 88 86  Resp:  16 16 18   Temp:  97.5 F (36.4 C) 98.4 F (36.9 C) 98.8 F (37.1 C)  TempSrc:  Oral Oral Oral  SpO2: 100% 100% 94% 100%  Weight:   111.1 kg (245 lb)   Height:       Physical Exam General: Obese chron ill, awake and pleasant , on HD Heart: RRR Lungs: CTAB anteriorly  Abdomen: soft bs+ NT Extremities: L BKA wound VAC in place, R leg chronic venous changes , +edema thighs/ pannus Dialysis Access: RUE AVG +bruit   Dialysis: [facility] MWF 4.5h   113.5kg   2/2.25 bath   TDC (now removed)/ AVG RUE   Hep 6000 w 5000 midrun prn VDRA: none EPO: Miercera q2wk, last 5/30 IV Fe: Currently has rec 3/5 Venofer 100mg  qTx1 Midodrine used preHD   Assessment: 1  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0  --  9.1 8.7*  PHOS 7.0*  --   --  9.2*   Liver Function Tests:  Recent Labs Lab 02/14/17 0957 02/15/17 0310 02/16/17 0803 02/19/17 0455  AST 25 26  --   --   ALT 26 23  --   --   ALKPHOS 303* 306*  --   --   BILITOT 0.9 1.4*  --   --   PROT 8.8* 7.7  --   --    ALBUMIN 2.6* 2.2* 2.2* 2.1*   No results for input(s): LIPASE, AMYLASE in the last 168 hours. CBC:  Recent Labs Lab 02/14/17 0957 02/15/17 0310 02/16/17 0519  02/17/17 0354 02/18/17 0344 02/19/17 0455  WBC 22.8* 17.9* 17.8*  --  13.3* 13.4* 15.5*  NEUTROABS 20.7*  --   --   --  11.0*  --   --   HGB 9.1* 7.6* 8.6*  < > 9.7* 9.8* 9.7*  HCT 28.9* 25.1* 27.7*  < > 30.9* 31.9* 31.4*  MCV 101.4* 101.2* 100.4*  --  101.3* 101.9* 101.6*  PLT 240 215 215  --  216 223 239  < > = values in this interval not displayed. Blood Culture    Component Value Date/Time   SDES BLOOD LEFT HAND 02/14/2017 1130   SPECREQUEST  02/14/2017 1130    BOTTLES DRAWN AEROBIC AND ANAEROBIC Blood Culture adequate volume   CULT NO GROWTH 4 DAYS 02/14/2017 1130   REPTSTATUS PENDING  02/14/2017 1130    Cardiac Enzymes: No results for input(s): CKTOTAL, CKMB, CKMBINDEX, TROPONINI in the last 168 hours. CBG:  Recent Labs Lab 02/18/17 0754 02/18/17 1231 02/18/17 1656 02/18/17 2050 02/19/17 0751  GLUCAP 157* 319* 214* 182* 141*   Iron Studies: No results for input(s): IRON, TIBC, TRANSFERRIN, FERRITIN in the last 72 hours. Lab Results  Component Value Date   INR 1.33 02/15/2017   INR 1.33 07/20/2015   INR 1.35 07/19/2015   Medications: . sodium chloride    . sodium chloride    . cefTRIAXone (ROCEPHIN)  IV 2 g (02/18/17 1824)  . methocarbamol (ROBAXIN)  IV     . aspirin EC  81 mg Oral Daily  . docusate sodium  100 mg Oral BID  . feeding supplement (PRO-STAT SUGAR FREE 64)  30 mL Oral BID  . heparin  5,000 Units Subcutaneous Q8H  . insulin aspart  0-9 Units Subcutaneous TID WC  . insulin glargine  24 Units Subcutaneous BID  . levothyroxine  88 mcg Oral QAC breakfast  . midodrine      . midodrine  10 mg Oral Daily  . multivitamin  1 tablet Oral QHS  . mupirocin ointment  1 application Nasal BID  . polyethylene glycol  17 g Oral Daily  . sevelamer carbonate  2.4 g Oral TID WC

## 2017-02-19 NOTE — Care Management Important Message (Signed)
Important Message  Patient Details  Name: Kimberly [name] MRN: [id number] Date of Birth: [birth year]   Medicare Important Message Given:  Yes    Jillianne Gamino, Annamarie MajorCheryl U, RN 02/19/2017, 12:52 PM

## 2017-02-19 NOTE — Progress Notes (Addendum)
Kimberly ManilaSusan K [name]  YNW:[id number] DOB: Sep 15, 1955 DOA: 02/14/2017 PCP: [name], [name], MD    Brief Narrative:   61 y.o. 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  The pt experienced acute worsening of the foot w/ erythema and drainage, and was instructed to come to the ED for evaluation.   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 Lajoyce Cornersuda - wound care per Ortho - appears stable at this time  - will discharge with the portable Prevena wound VAC pump attached to the dressing  Citrobacter koseri bacteremia - Felt to be due to above - dialysis catheter removed 6/7 - initially and VAC and Zosyn, continue with IV Rocephin, during hospital stay, will discharge on cefazolin post hemodialysis 2 .  ESRD on HD M/W/F - Nephrology following - to cont HD on schedule , Inc. hemodialysis today sitting on a chair   Chronic hypotension - Continue midodrine - stable at this time   CAD - Stable clinically  , denies any chest pain or shortness of breath. - Continue aspirin  Anemia of chronic kidney disease  - hemoglobin 9.7, on erythropoietin   DM2 - CBG acceptable, continue with exercising scale on current dose Lantus   Grade 2 Chronic diastolic congestive heart failure - Volume management per HD - no evidence of volume overload    Dysphagia - Status post stroke 5 years ago, microscope aspiration, change to dysphagia 1 clear liquid diet which she has been tolerating for years.  Hypothyroidism - Continue Synthroid  Chronic hypoxic respiratory failure -  - At baseline on 2 L nasal cannula  obesity hypoventilation -   COPD - Stable, no active wheezing  MRSA screen +  .   DVT prophylaxis: Subcutaneous  heparin Code Status: FULL CODE Family Communication: no family present at time of exam  Disposition Plan: SNF for discharge for tomorrow  Consultants:  Nephrology Orthopedics  Procedures: none  Antimicrobials:  Zosyn 6/6 > Vancomycin 6/6 > 6/8 Rocephin  Objective: Blood pressure (!) 106/54, pulse 86, temperature 98.8 F (37.1 C), temperature source Oral, resp. rate 18, height 5\' 4"  (1.626 m), weight 111.1 kg (245 lb), SpO2 100 %.  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  CBC:  Recent Labs Lab 02/14/17 0957 02/15/17 0310 02/16/17 0519 02/16/17 1618 02/17/17 0354 02/18/17 0344 02/19/17 0455  WBC 22.8* 17.9* 17.8*  --  13.3* 13.4* 15.5*  NEUTROABS 20.7*  --   --   --  11.0*  --   --   HGB 9.1* 7.6* 8.6* 10.9* 9.7* 9.8* 9.7*  HCT 28.9* 25.1* 27.7* 32.0* 30.9* 31.9* 31.4*  MCV 101.4* 101.2* 100.4*  --  101.3* 101.9* 101.6*  PLT 240 215 215  --  216 223 239   Basic Metabolic Panel:  Recent Labs Lab 02/14/17 0957 02/14/17 1132 02/15/17 0310 02/16/17 0803 02/16/17 1618 02/17/17 0354 02/19/17 0455  NA 124*  --  130* 129* 130* 132* 132*  K 5.6*  --  4.9 5.1 4.4 4.9 5.2*  CL 88*  --  90* 92*  --  92* 93*  CO2 20*  --  24 22  --  23 21*  GLUCOSE 400*  --  163* 145* 154* 210* 140*  BUN 48*  --  30* 49*  --  36* 65*  CREATININE 5.63*  --  4.28* 5.70*  --  4.67*  7.21*  CALCIUM 9.2  --  8.6* 9.0  --  9.1 8.7*  MG  --  2.0  --   --   --  2.2  --   PHOS  --   --   --  7.0*  --   --  9.2*   GFR: Estimated Creatinine Clearance: 9.9 mL/min (A) (by C-G formula based on SCr of 7.21 mg/dL (H)).  Liver Function Tests:  Recent Labs Lab 02/14/17 0957 02/15/17 0310 02/16/17 0803 02/19/17 0455  AST 25 26  --   --   ALT 26 23  --   --   ALKPHOS 303* 306*  --   --   BILITOT 0.9 1.4*  --   --   PROT 8.8* 7.7  --   --   ALBUMIN 2.6* 2.2* 2.2* 2.1*    Coagulation Profile:  Recent Labs Lab 02/15/17 0310  INR 1.33    HbA1C: Hemoglobin A1C  Date/Time Value Ref Range Status  10/06/2016 6.6  Final   Hgb A1c MFr Bld  Date/Time Value Ref Range Status  08/23/2016 01:00 PM 9.9 (H) 4.8 - 5.6 % Final    Comment:    (NOTE)         Pre-diabetes: 5.7 - 6.4         Diabetes: >6.4         Glycemic control for adults with diabetes: <7.0   04/15/2015 05:44 AM 8.7 (H) 4.8 - 5.6 % Final    Comment:    (NOTE)         Pre-diabetes: 5.7 - 6.4         Diabetes: >6.4         Glycemic control for adults with diabetes: <7.0     CBG:  Recent Labs Lab 02/18/17 0754 02/18/17 1231 02/18/17 1656 02/18/17 2050 02/19/17 0751  GLUCAP 157* 319* 214* 182* 141*    Recent Results (from the past 240 hour(s))  Culture, blood (Routine X 2) w Reflex to ID Panel     Status: Abnormal   Collection Time: 02/14/17 10:56 AM  Result Value Ref Range Status   Specimen Description BLOOD LEFT HAND  Final   Special Requests IN PEDIATRIC BOTTLE Blood Culture adequate volume  Final   Culture  Setup Time   Final    GRAM NEGATIVE RODS IN PEDIATRIC BOTTLE CRITICAL RESULT CALLED TO, READ BACK BY AND VERIFIED WITHMerlene Morse PHARMD, AT [phone] 02/15/17 BY D.VANHOOK    Culture CITROBACTER KOSERI (A)  Final   Report Status 02/17/2017 FINAL  Final   Organism ID, Bacteria CITROBACTER KOSERI  Final      Susceptibility   Citrobacter koseri - MIC*    CEFAZOLIN <=4 SENSITIVE Sensitive       CEFEPIME <=1 SENSITIVE Sensitive     CEFTAZIDIME <=1 SENSITIVE Sensitive     CEFTRIAXONE <=1 SENSITIVE Sensitive     CIPROFLOXACIN <=0.25 SENSITIVE Sensitive     GENTAMICIN <=1 SENSITIVE Sensitive     IMIPENEM <=0.25 SENSITIVE Sensitive     TRIMETH/SULFA <=20 SENSITIVE Sensitive     PIP/TAZO <=4 SENSITIVE Sensitive     * CITROBACTER KOSERI  Blood Culture ID Panel (Reflexed)     Status: Abnormal   Collection Time: 02/14/17 10:56 AM  Result Value Ref Range Status   Enterococcus species NOT DETECTED NOT DETECTED Final   Listeria monocytogenes NOT DETECTED NOT DETECTED Final   Staphylococcus species NOT DETECTED NOT DETECTED Final   Staphylococcus aureus NOT DETECTED NOT DETECTED Final   Streptococcus species NOT DETECTED NOT DETECTED Final   Streptococcus agalactiae NOT DETECTED NOT DETECTED Final   Streptococcus pneumoniae NOT DETECTED NOT DETECTED Final   Streptococcus pyogenes NOT DETECTED NOT DETECTED Final   Acinetobacter baumannii NOT DETECTED NOT DETECTED Final   Enterobacteriaceae species DETECTED (A) NOT DETECTED Final    Comment: Enterobacteriaceae represent a large family of gram negative bacteria, not a single organism. Refer to culture for further identification. CRITICAL RESULT CALLED TO, READ BACK BY AND VERIFIED WITH: Merlene Morse PHARMD, AT [phone] 02/15/17 BY D.VANHOOK    Enterobacter cloacae complex NOT DETECTED NOT DETECTED Final   Escherichia coli NOT DETECTED NOT DETECTED Final   Klebsiella oxytoca NOT DETECTED NOT DETECTED Final   Klebsiella pneumoniae NOT DETECTED NOT DETECTED Final   Proteus species NOT DETECTED NOT DETECTED Final   Serratia marcescens NOT DETECTED NOT DETECTED Final   Carbapenem resistance NOT DETECTED NOT DETECTED Final   Haemophilus influenzae NOT DETECTED NOT DETECTED Final   Neisseria meningitidis NOT DETECTED NOT DETECTED Final   Pseudomonas aeruginosa NOT DETECTED NOT DETECTED Final   Candida albicans NOT DETECTED NOT DETECTED Final    Candida glabrata NOT DETECTED NOT DETECTED Final   Candida krusei NOT DETECTED NOT DETECTED Final   Candida parapsilosis NOT DETECTED NOT DETECTED Final   Candida tropicalis NOT DETECTED NOT DETECTED Final  Culture, blood (Routine X 2) w Reflex to ID Panel     Status: None (Preliminary result)   Collection Time: 02/14/17 11:30 AM  Result Value Ref Range Status   Specimen Description BLOOD LEFT HAND  Final   Special Requests   Final    BOTTLES DRAWN AEROBIC AND ANAEROBIC Blood Culture adequate volume   Culture NO GROWTH 4 DAYS  Final   Report Status PENDING  Incomplete  MRSA PCR Screening     Status: Abnormal   Collection Time: 02/14/17  2:26 PM  Result Value Ref Range Status   MRSA by PCR POSITIVE (A) NEGATIVE Final    Comment:        The GeneXpert MRSA Assay (FDA approved for NASAL specimens only), is one component of a comprehensive MRSA colonization surveillance program. It is not intended to diagnose MRSA infection nor to guide or monitor treatment for MRSA infections. RESULT CALLED TO, READ BACK BY AND VERIFIED WITH: Ronnald Nian, RN [phone] 02/14/2017 T.TYSOR      Scheduled Meds: . aspirin EC  81 mg Oral Daily  . docusate sodium  100 mg Oral BID  .  feeding supplement (PRO-STAT SUGAR FREE 64)  30 mL Oral BID  . heparin  5,000 Units Subcutaneous Q8H  . insulin aspart  0-9 Units Subcutaneous TID WC  . insulin glargine  24 Units Subcutaneous BID  . levothyroxine  88 mcg Oral QAC breakfast  . midodrine      . midodrine  10 mg Oral Daily  . multivitamin  1 tablet Oral QHS  . mupirocin ointment  1 application Nasal BID  . polyethylene glycol  17 g Oral Daily  . sevelamer carbonate  2.4 g Oral TID WC     LOS: 5 days   Huey Bienenstock, MD Pager [phone] [facility] Office  [phone] Pager - Text Page per Amion as per below:  On-Call/Text Page:      [email or web address]      password [email or web address]  If 7PM-7AM, please contact night-coverage [email or web address] Password  [facility] 02/19/2017, 10:32 AM

## 2017-02-19 NOTE — Clinical Social Work Note (Signed)
SNF consult received today and patient should be ready for d/c on Tuesday, 6/12. Assessment completed with patient (full assessment to follow) and [facility]. CSW will initiate facility search and contact facility on 6/12.  Genelle BalVanessa Esbeidy Mclaine, MSW, [name] Licensed Clinical Social Worker Clinical Social Work Department [facility] [phone]

## 2017-02-20 DIAGNOSIS — N186 End stage renal disease: Secondary | ICD-10-CM | POA: Diagnosis not present

## 2017-02-20 DIAGNOSIS — L89610 Pressure ulcer of right heel, unstageable: Secondary | ICD-10-CM | POA: Diagnosis not present

## 2017-02-20 DIAGNOSIS — E669 Obesity, unspecified: Secondary | ICD-10-CM | POA: Diagnosis not present

## 2017-02-20 DIAGNOSIS — Z66 Do not resuscitate: Secondary | ICD-10-CM | POA: Diagnosis present

## 2017-02-20 DIAGNOSIS — L97523 Non-pressure chronic ulcer of other part of left foot with necrosis of muscle: Secondary | ICD-10-CM | POA: Diagnosis not present

## 2017-02-20 DIAGNOSIS — N950 Postmenopausal bleeding: Secondary | ICD-10-CM | POA: Diagnosis not present

## 2017-02-20 DIAGNOSIS — R0989 Other specified symptoms and signs involving the circulatory and respiratory systems: Secondary | ICD-10-CM | POA: Diagnosis not present

## 2017-02-20 DIAGNOSIS — Y95 Nosocomial condition: Secondary | ICD-10-CM | POA: Diagnosis present

## 2017-02-20 DIAGNOSIS — E1140 Type 2 diabetes mellitus with diabetic neuropathy, unspecified: Secondary | ICD-10-CM | POA: Diagnosis present

## 2017-02-20 DIAGNOSIS — R0902 Hypoxemia: Secondary | ICD-10-CM | POA: Diagnosis not present

## 2017-02-20 DIAGNOSIS — H906 Mixed conductive and sensorineural hearing loss, bilateral: Secondary | ICD-10-CM | POA: Diagnosis not present

## 2017-02-20 DIAGNOSIS — I132 Hypertensive heart and chronic kidney disease with heart failure and with stage 5 chronic kidney disease, or end stage renal disease: Secondary | ICD-10-CM | POA: Diagnosis present

## 2017-02-20 DIAGNOSIS — E113599 Type 2 diabetes mellitus with proliferative diabetic retinopathy without macular edema, unspecified eye: Secondary | ICD-10-CM | POA: Diagnosis present

## 2017-02-20 DIAGNOSIS — N898 Other specified noninflammatory disorders of vagina: Secondary | ICD-10-CM | POA: Diagnosis not present

## 2017-02-20 DIAGNOSIS — B999 Unspecified infectious disease: Secondary | ICD-10-CM | POA: Diagnosis not present

## 2017-02-20 DIAGNOSIS — J9600 Acute respiratory failure, unspecified whether with hypoxia or hypercapnia: Secondary | ICD-10-CM | POA: Diagnosis not present

## 2017-02-20 DIAGNOSIS — Z22322 Carrier or suspected carrier of Methicillin resistant Staphylococcus aureus: Secondary | ICD-10-CM | POA: Diagnosis not present

## 2017-02-20 DIAGNOSIS — S91309A Unspecified open wound, unspecified foot, initial encounter: Secondary | ICD-10-CM | POA: Diagnosis not present

## 2017-02-20 DIAGNOSIS — L89612 Pressure ulcer of right heel, stage 2: Secondary | ICD-10-CM | POA: Diagnosis present

## 2017-02-20 DIAGNOSIS — I129 Hypertensive chronic kidney disease with stage 1 through stage 4 chronic kidney disease, or unspecified chronic kidney disease: Secondary | ICD-10-CM | POA: Diagnosis not present

## 2017-02-20 DIAGNOSIS — E662 Morbid (severe) obesity with alveolar hypoventilation: Secondary | ICD-10-CM | POA: Diagnosis not present

## 2017-02-20 DIAGNOSIS — D509 Iron deficiency anemia, unspecified: Secondary | ICD-10-CM | POA: Diagnosis not present

## 2017-02-20 DIAGNOSIS — D631 Anemia in chronic kidney disease: Secondary | ICD-10-CM | POA: Diagnosis not present

## 2017-02-20 DIAGNOSIS — T829XXA Unspecified complication of cardiac and vascular prosthetic device, implant and graft, initial encounter: Secondary | ICD-10-CM | POA: Diagnosis not present

## 2017-02-20 DIAGNOSIS — E1152 Type 2 diabetes mellitus with diabetic peripheral angiopathy with gangrene: Secondary | ICD-10-CM | POA: Diagnosis present

## 2017-02-20 DIAGNOSIS — Z7409 Other reduced mobility: Secondary | ICD-10-CM | POA: Diagnosis not present

## 2017-02-20 DIAGNOSIS — I110 Hypertensive heart disease with heart failure: Secondary | ICD-10-CM | POA: Diagnosis not present

## 2017-02-20 DIAGNOSIS — A419 Sepsis, unspecified organism: Secondary | ICD-10-CM | POA: Diagnosis not present

## 2017-02-20 DIAGNOSIS — R0603 Acute respiratory distress: Secondary | ICD-10-CM | POA: Diagnosis not present

## 2017-02-20 DIAGNOSIS — R52 Pain, unspecified: Secondary | ICD-10-CM | POA: Diagnosis not present

## 2017-02-20 DIAGNOSIS — N2581 Secondary hyperparathyroidism of renal origin: Secondary | ICD-10-CM | POA: Diagnosis present

## 2017-02-20 DIAGNOSIS — Y832 Surgical operation with anastomosis, bypass or graft as the cause of abnormal reaction of the patient, or of later complication, without mention of misadventure at the time of the procedure: Secondary | ICD-10-CM | POA: Diagnosis not present

## 2017-02-20 DIAGNOSIS — E118 Type 2 diabetes mellitus with unspecified complications: Secondary | ICD-10-CM | POA: Diagnosis not present

## 2017-02-20 DIAGNOSIS — J410 Simple chronic bronchitis: Secondary | ICD-10-CM | POA: Diagnosis not present

## 2017-02-20 DIAGNOSIS — Z992 Dependence on renal dialysis: Secondary | ICD-10-CM | POA: Diagnosis not present

## 2017-02-20 DIAGNOSIS — J3800 Paralysis of vocal cords and larynx, unspecified: Secondary | ICD-10-CM | POA: Diagnosis present

## 2017-02-20 DIAGNOSIS — E039 Hypothyroidism, unspecified: Secondary | ICD-10-CM | POA: Diagnosis not present

## 2017-02-20 DIAGNOSIS — R05 Cough: Secondary | ICD-10-CM | POA: Diagnosis not present

## 2017-02-20 DIAGNOSIS — E1129 Type 2 diabetes mellitus with other diabetic kidney complication: Secondary | ICD-10-CM | POA: Diagnosis not present

## 2017-02-20 DIAGNOSIS — Z9981 Dependence on supplemental oxygen: Secondary | ICD-10-CM | POA: Diagnosis not present

## 2017-02-20 DIAGNOSIS — R69 Illness, unspecified: Secondary | ICD-10-CM | POA: Diagnosis not present

## 2017-02-20 DIAGNOSIS — I2510 Atherosclerotic heart disease of native coronary artery without angina pectoris: Secondary | ICD-10-CM | POA: Diagnosis not present

## 2017-02-20 DIAGNOSIS — J9611 Chronic respiratory failure with hypoxia: Secondary | ICD-10-CM | POA: Diagnosis present

## 2017-02-20 DIAGNOSIS — J449 Chronic obstructive pulmonary disease, unspecified: Secondary | ICD-10-CM | POA: Diagnosis not present

## 2017-02-20 DIAGNOSIS — S8990XA Unspecified injury of unspecified lower leg, initial encounter: Secondary | ICD-10-CM | POA: Diagnosis not present

## 2017-02-20 DIAGNOSIS — E875 Hyperkalemia: Secondary | ICD-10-CM | POA: Diagnosis not present

## 2017-02-20 DIAGNOSIS — J440 Chronic obstructive pulmonary disease with acute lower respiratory infection: Secondary | ICD-10-CM | POA: Diagnosis present

## 2017-02-20 DIAGNOSIS — Z87891 Personal history of nicotine dependence: Secondary | ICD-10-CM | POA: Diagnosis not present

## 2017-02-20 DIAGNOSIS — T829XXD Unspecified complication of cardiac and vascular prosthetic device, implant and graft, subsequent encounter: Secondary | ICD-10-CM | POA: Diagnosis not present

## 2017-02-20 DIAGNOSIS — J441 Chronic obstructive pulmonary disease with (acute) exacerbation: Secondary | ICD-10-CM | POA: Diagnosis not present

## 2017-02-20 DIAGNOSIS — J310 Chronic rhinitis: Secondary | ICD-10-CM | POA: Diagnosis not present

## 2017-02-20 DIAGNOSIS — E871 Hypo-osmolality and hyponatremia: Secondary | ICD-10-CM | POA: Diagnosis present

## 2017-02-20 DIAGNOSIS — L97823 Non-pressure chronic ulcer of other part of left lower leg with necrosis of muscle: Secondary | ICD-10-CM | POA: Diagnosis not present

## 2017-02-20 DIAGNOSIS — I96 Gangrene, not elsewhere classified: Secondary | ICD-10-CM | POA: Diagnosis not present

## 2017-02-20 DIAGNOSIS — I5032 Chronic diastolic (congestive) heart failure: Secondary | ICD-10-CM | POA: Diagnosis not present

## 2017-02-20 DIAGNOSIS — I480 Paroxysmal atrial fibrillation: Secondary | ICD-10-CM | POA: Diagnosis not present

## 2017-02-20 DIAGNOSIS — T8781 Dehiscence of amputation stump: Secondary | ICD-10-CM | POA: Diagnosis not present

## 2017-02-20 DIAGNOSIS — E119 Type 2 diabetes mellitus without complications: Secondary | ICD-10-CM | POA: Diagnosis not present

## 2017-02-20 DIAGNOSIS — R079 Chest pain, unspecified: Secondary | ICD-10-CM | POA: Diagnosis not present

## 2017-02-20 DIAGNOSIS — Z89512 Acquired absence of left leg below knee: Secondary | ICD-10-CM | POA: Diagnosis not present

## 2017-02-20 DIAGNOSIS — R2689 Other abnormalities of gait and mobility: Secondary | ICD-10-CM | POA: Diagnosis not present

## 2017-02-20 DIAGNOSIS — J189 Pneumonia, unspecified organism: Secondary | ICD-10-CM | POA: Diagnosis not present

## 2017-02-20 DIAGNOSIS — T8249XA Other complication of vascular dialysis catheter, initial encounter: Secondary | ICD-10-CM | POA: Diagnosis not present

## 2017-02-20 DIAGNOSIS — K219 Gastro-esophageal reflux disease without esophagitis: Secondary | ICD-10-CM | POA: Diagnosis present

## 2017-02-20 DIAGNOSIS — T82868A Thrombosis of vascular prosthetic devices, implants and grafts, initial encounter: Secondary | ICD-10-CM | POA: Diagnosis not present

## 2017-02-20 DIAGNOSIS — H9313 Tinnitus, bilateral: Secondary | ICD-10-CM | POA: Diagnosis not present

## 2017-02-20 DIAGNOSIS — L03119 Cellulitis of unspecified part of limb: Secondary | ICD-10-CM | POA: Diagnosis not present

## 2017-02-20 DIAGNOSIS — J989 Respiratory disorder, unspecified: Secondary | ICD-10-CM | POA: Diagnosis not present

## 2017-02-20 DIAGNOSIS — E6609 Other obesity due to excess calories: Secondary | ICD-10-CM | POA: Diagnosis not present

## 2017-02-20 DIAGNOSIS — S41101A Unspecified open wound of right upper arm, initial encounter: Secondary | ICD-10-CM | POA: Diagnosis not present

## 2017-02-20 DIAGNOSIS — I509 Heart failure, unspecified: Secondary | ICD-10-CM | POA: Diagnosis not present

## 2017-02-20 DIAGNOSIS — N8502 Endometrial intraepithelial neoplasia [EIN]: Secondary | ICD-10-CM | POA: Diagnosis not present

## 2017-02-20 DIAGNOSIS — N184 Chronic kidney disease, stage 4 (severe): Secondary | ICD-10-CM | POA: Diagnosis not present

## 2017-02-20 DIAGNOSIS — B957 Other staphylococcus as the cause of diseases classified elsewhere: Secondary | ICD-10-CM | POA: Diagnosis present

## 2017-02-20 DIAGNOSIS — L03116 Cellulitis of left lower limb: Secondary | ICD-10-CM | POA: Diagnosis not present

## 2017-02-20 DIAGNOSIS — T82868D Thrombosis of vascular prosthetic devices, implants and grafts, subsequent encounter: Secondary | ICD-10-CM | POA: Diagnosis not present

## 2017-02-20 DIAGNOSIS — R7881 Bacteremia: Secondary | ICD-10-CM | POA: Diagnosis present

## 2017-02-20 DIAGNOSIS — E1122 Type 2 diabetes mellitus with diabetic chronic kidney disease: Secondary | ICD-10-CM | POA: Diagnosis present

## 2017-02-20 DIAGNOSIS — Z89511 Acquired absence of right leg below knee: Secondary | ICD-10-CM | POA: Diagnosis not present

## 2017-02-20 DIAGNOSIS — Y835 Amputation of limb(s) as the cause of abnormal reaction of the patient, or of later complication, without mention of misadventure at the time of the procedure: Secondary | ICD-10-CM | POA: Diagnosis present

## 2017-02-20 DIAGNOSIS — N179 Acute kidney failure, unspecified: Secondary | ICD-10-CM | POA: Diagnosis not present

## 2017-02-20 DIAGNOSIS — H6523 Chronic serous otitis media, bilateral: Secondary | ICD-10-CM | POA: Diagnosis not present

## 2017-02-20 DIAGNOSIS — I447 Left bundle-branch block, unspecified: Secondary | ICD-10-CM | POA: Diagnosis present

## 2017-02-20 DIAGNOSIS — Z30431 Encounter for routine checking of intrauterine contraceptive device: Secondary | ICD-10-CM | POA: Diagnosis not present

## 2017-02-20 LAB — GLUCOSE, CAPILLARY
GLUCOSE-CAPILLARY: 207 mg/dL — AB (ref 65–99)
Glucose-Capillary: 243 mg/dL — ABNORMAL HIGH (ref 65–99)

## 2017-02-20 MED ORDER — PRO-STAT SUGAR FREE PO LIQD: 30.0000 mL | Freq: Two times a day (BID) | ORAL | 0 refills | Status: DC

## 2017-02-20 MED ORDER — SENNOSIDES 8.6 MG PO TABS: 2.0000 | ORAL_TABLET | Freq: Every evening | ORAL | Status: AC | PRN

## 2017-02-20 MED ORDER — DEXTROSE 5 % IV SOLN: 2.0000 g | INTRAVENOUS | Status: AC

## 2017-02-20 MED ORDER — MIDODRINE HCL 10 MG PO TABS: 10.0000 mg | ORAL_TABLET | Freq: Every day | ORAL | 0 refills | Status: AC

## 2017-02-20 MED ORDER — POLYETHYLENE GLYCOL 3350 17 G PO PACK: 17.0000 g | PACK | Freq: Every day | ORAL | 0 refills | Status: AC | PRN

## 2017-02-20 MED ORDER — ACETAMINOPHEN 325 MG PO TABS: 650.0000 mg | ORAL_TABLET | Freq: Four times a day (QID) | ORAL | Status: AC | PRN

## 2017-02-20 MED ORDER — INSULIN GLARGINE 100 UNIT/ML ~~LOC~~ SOLN: 28.0000 [IU] | Freq: Two times a day (BID) | SUBCUTANEOUS | 11 refills | Status: DC

## 2017-02-20 MED ORDER — INSULIN ASPART 100 UNIT/ML ~~LOC~~ SOLN: 0.0000 [IU] | Freq: Three times a day (TID) | SUBCUTANEOUS | 11 refills | Status: DC

## 2017-02-20 NOTE — Progress Notes (Signed)
Physical Therapy Note:  With the DC plan of going to SNF (potentially today), will decr Acute PT frequency to min 2x/week per departmental policy; Will continue to follow while in-hospital;   Thank you,  Van ClinesHolly Amayrani Bennick, PT  Acute Rehabilitation Services Pager [phone] Office [phone]

## 2017-02-20 NOTE — Progress Notes (Signed)
[facility] via [facility]. All belongings with patient. Preevana Wound Vac in place. IV removed. Telemetry removed. Patient left the unit in stable condition.  Avelina LaineKimberly Yaqub Arney RN

## 2017-02-20 NOTE — Discharge Instructions (Signed)
Follow with Primary MD [name], [name], MD after discharge from SNF  Get CBC, CMP, y checked  by Primary MD next visit.    Activity: As per PT recommendation   Disposition SNF   Diet: Diet female, with carbohydrate modified, 1500 mL fluid restrictions , pureed  secondary to poor dentition , with feeding assistance and aspiration precautions.  For Heart failure patients - Check your Weight same time everyday, if you gain over 2 pounds, or you develop in leg swelling, experience more shortness of breath or chest pain, call your Primary MD immediately. Follow Cardiac Low Salt Diet and 1.5 lit/day fluid restriction.   On your next visit with your primary care physician please Get Medicines reviewed and adjusted.   Please request your Prim.MD to go over all Hospital Tests and Procedure/Radiological results at the follow up, please get all Hospital records sent to your Prim MD by signing hospital release before you go home.   If you experience worsening of your admission symptoms, develop shortness of breath, life threatening emergency, suicidal or homicidal thoughts you must seek medical attention immediately by calling 911 or calling your MD immediately  if symptoms less severe.  You Must read complete instructions/literature along with all the possible adverse reactions/side effects for all the Medicines you take and that have been prescribed to you. Take any new Medicines after you have completely understood and accpet all the possible adverse reactions/side effects.   Do not drive, operating heavy machinery, perform activities at heights, swimming or participation in water activities or provide baby sitting services if your were admitted for syncope or siezures until you have seen by Primary MD or a Neurologist and advised to do so again.  Do not drive when taking Pain medications.    Do not take more than prescribed Pain, Sleep and Anxiety Medications  Special Instructions: If you  have smoked or chewed Tobacco  in the last 2 yrs please stop smoking, stop any regular Alcohol  and or any Recreational drug use.  Wear Seat belts while driving.   Please note  You were cared for by a hospitalist during your hospital stay. If you have any questions about your discharge medications or the care you received while you were in the hospital after you are discharged, you can call the unit and asked to speak with the hospitalist on call if the hospitalist that took care of you is not available. Once you are discharged, your primary care physician will handle any further medical issues. Please note that NO REFILLS for any discharge medications will be authorized once you are discharged, as it is imperative that you return to your primary care physician (or establish a relationship with a primary care physician if you do not have one) for your aftercare needs so that they can reassess your need for medications and monitor your lab values.

## 2017-02-20 NOTE — Discharge Summary (Addendum)
Kimberly [name], is a 61 y.o. female  DOB 1956/02/27  MRN [id number].  Admission date:  02/14/2017  Admitting Physician  Ozella Rocks, MD  Discharge Date:  02/20/2017   Primary MD  Merlene Laughter, MD  Recommendations for primary care physician for things to follow:  - Patient to follow through. Dr. Lajoyce Corners in 1 week, facility to arrange for appointment and discharge   Admission Diagnosis  ESRD (end stage renal disease) (HCC) [N18.6] Cellulitis of left lower extremity [L03.116] Congestive heart failure (CHF) (HCC) [I50.9] COPD with respiratory distress, acute (HCC) [J44.9, R06.03] Skin ulcer of left foot with necrosis of muscle (HCC) [L97.523] Severe comorbid illness [R69] Sepsis (HCC) [A41.9]   Discharge Diagnosis  ESRD (end stage renal disease) (HCC) [N18.6] Cellulitis of left lower extremity [L03.116] Congestive heart failure (CHF) (HCC) [I50.9] COPD with respiratory distress, acute (HCC) [J44.9, R06.03] Skin ulcer of left foot with necrosis of muscle (HCC) [L97.523] Severe comorbid illness [R69] Sepsis (HCC) [A41.9]    Active Problems:   DM (diabetes mellitus), type 2 with renal complications (HCC)   Hyperlipidemia associated with type 2 diabetes mellitus (HCC)   Hypertensive heart disease   CAD (coronary artery disease), native coronary artery   COPD (chronic obstructive pulmonary disease) (HCC)   Chronic diastolic CHF (congestive heart failure) (HCC)   Chronic respiratory failure with hypoxia (HCC)   On home oxygen therapy   Hypothyroidism   Morbid obesity (HCC)   Obesity hypoventilation syndrome (HCC)   Paroxysmal atrial fibrillation (HCC)   MRSA carrier   Cellulitis of leg   Cellulitis of left foot   Sepsis (HCC)   Gangrene of left foot (HCC)   Congestive heart failure (CHF) (HCC)   Diabetes mellitus with complication (HCC)      Past Medical History:  Diagnosis Date  . Anemia     . Anxiety   . Arthritis    "knees, feet, hands" (08/23/2016)  . Asthma   . CAD, NATIVE VESSEL    May 10, 2010 cath showed a hyperdynamic LV function, she had dominant circumflex anatomy with a 70-80% small OM1. She had diffuse diabetic plaque particularly in the distal LAD. She nondominant RCA.  Nondominant  . Cellulitis 10/15/2013  . CHF (congestive heart failure) (HCC)    Preserved EF  . Chronic bronchitis (HCC)    "probably once/ yr" (08/23/2016)  . Complication of anesthesia    "I've had difficulty waking up" (08/23/2016)  . COPD   . Depression   . Diabetic neuropathy (HCC)   . Dyspnea   . Endometrial hyperplasia   . ESRD (end stage renal disease) on dialysis (HCC)    "[facility]; MWF; [facility]" (08/23/2016)  . Family history of adverse reaction to anesthesia    mother had hard time waking up  . GERD (gastroesophageal reflux disease)   . History of hiatal hernia   . HYPERLIPIDEMIA   . HYPERTENSION   . Hypothyroidism   . Myocardial infarct, old   . On home oxygen therapy    "2L;  24/7" (08/23/2016)  . Paralyzed vocal cords   . Peripheral neuropathy    hx/notes 01/27/2010  . Pneumonia "several times"  . Proliferative retinopathy    hx/notes 01/27/2010  . PVD    CEA  . Restless leg syndrome    "mostly on the right" (08/23/2016)  . 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 Type II diabetes mellitus (HCC)     Past Surgical History:  Procedure Laterality Date  . AMPUTATION Left 02/16/2017   Procedure: Left Below Knee Amputation;  Surgeon: Nadara Mustarduda, Marcus V, MD;  [facility];  Service: Orthopedics;  Laterality: Left;  . AV FISTULA PLACEMENT Right 07/08/2015   Procedure: EXPLORATION RIGHT AXILLARY ARTERY AND RIGHT BRACHIAL VEIN;  Surgeon: Chuck Hinthristopher S Dickson, MD;  [facility];  Service: Vascular;  Laterality: Right;  . AV FISTULA PLACEMENT Right  01/02/2017   Procedure: INSERTION OF ARTERIOVENOUS GORE-TEX  GRAFT  RIGHT ARM;  Surgeon: Chuck Hinthristopher S Dickson, MD;  [facility];  Service: Vascular;  Laterality: Right;  . CAROTID ENDARTERECTOMY Left X 2  . CATARACT EXTRACTION W/ INTRAOCULAR LENS  IMPLANT, BILATERAL Bilateral   . CERVICAL BIOPSY  ~ 2015   "precancerous cells"  . COLONOSCOPY    . EYE SURGERY Bilateral    numerous surgeries  . INSERTION OF DIALYSIS CATHETER N/A 06/24/2015   Procedure: ULTRASOUND BILATERAL INTERNAL JUGULAR VEIN INSERTION OF DIALYSIS CATHETER LEFT INTERNAL JUGULAR VEIN ;  Surgeon: Pryor OchoaJames D Lawson, MD;  [facility];  Service: Vascular;  Laterality: N/A;  . INTRAUTERINE DEVICE INSERTION  ~ 2015  . IR REMOVAL TUN CV CATH W/O FL  02/15/2017  . VITRECTOMY Bilateral        History of present illness and  Hospital Course:     Kindly see H&P for history of present illness and admission details, please review complete Labs, Consult reports and Test reports for all details in brief  HPI  from the history and physical done on the day of admission 02/14/2017 HPI: Kimberly [name] is a 61 y.o. female with extensive medical history listed below including peripheral vascular disease, diabetes, ESRB on hemodialysis Monday Wednesday Friday, do today. The patient has been noticing worsening left foot 1 over the last 2 days. This was being treated by the wound care clinic and Dr. Roxan Hockeyobinson. Since  yesterday, that wound has advanced significantly, with aerythema a and drainageThe patient denies any worsening lower extremity swelling, but she reports today that this edema is controlled. No recent surgeries in the area. She is up to date with her medications. Denies any insect bite, or walking barefoot.  She denies any tobacco, alcohol or recreational drug use. She was referred to come directly to the ED for further evaluation. She denies any fever chills or night sweats. She denies chest pain or palpitations. She denies any abdominal  pain. She makes very little urine, but denies dysuria or hematuria. No confusion is reported   ED Course:  BP (!) 110/52   Pulse 98   Temp 98.4 F (36.9 C) (Oral)   Resp 16   SpO2 100%    sodium 124 potassium 5.6 bicarb 20 glucose 400 BUN 48 creatinine 5.63 calcium 9.2 anion gap  16 albumin 2.6 total bilirubin 0.9 GFR 7 lactic acid 3.24 urinalysis pending  EKG sinus rhythm, left bundle branch block, no acute changes from prior. Last  ABI  was taken in all goes 2016, with normal Arterial flow bilaterally CXR pending  Left foot XR pending    Hospital Course   61 y.o.F w/ hx of PVD, DM, ESRD on HD M/W/F who was being treated by the [facility] clinic for a chronic L diabetic foot infection.  The pt experienced acute worsening of the foot w/ erythema and drainage, and was instructed to come to the ED for evaluation.    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 Lajoyce Corners - wound care per Ortho - appears stable at this time  - will discharge with the portable Prevena wound VAC pump attached to the dressing - Follow with Dr. Lajoyce Corners in 1 week from discharge  Citrobacter koseri bacteremia - Felt to be due to above - dialysis catheter removed 6/7 - initially and VANC and Zosyn, treated with IV Rocephin during hospital stay, , will need cefazolin after hemodialysis 2 treatments .  ESRD on HD M/W/F - Nephrology following - to cont HD on schedule , alerted hemodialysis on dialysis chair 6/11  Chronic hypotension - Continue midodrine - stable at this time   CAD - Stable clinically  , denies any chest pain or shortness of breath. - Continue aspirin  Anemia of chronic kidney disease  -  on erythropoietin   DM2 - CBG acceptable, continue with insulin sliding scale on current dose Lantus   Grade 2 Chronic diastolic congestive heart failure - Volume management per HD - no evidence of volume overload     Dysphagia - Status post stroke 5 years ago, microscope aspiration, change to dysphagia 1 clear liquid diet which she has been tolerating for years.  Hypothyroidism - Continue Synthroid  Chronic hypoxic respiratory failure -  - At baseline on 2 L nasal cannula  obesity hypoventilation -   COPD - Stable, no active wheezing    Discharge Condition:  stable   Follow UP  Follow-up Information    Nadara Mustard, MD Follow up in 1 week(s).   Specialty:  Orthopedic Surgery Contact information: [address] [place] [address] [phone]             Discharge Instructions  and  Discharge Medications    Discharge Instructions    Discharge instructions    Complete by:  As directed    Follow with Primary MD Merlene Laughter, MD after discharge from SNF  Get CBC, CMP, y checked  by Primary MD next visit.    Activity: As per PT recommendation   Disposition SNF   Diet: Diet female, with carbohydrate modified, 1500 mL fluid restrictions , pureed  secondary to poor dentition , with feeding assistance and aspiration precautions.  For Heart failure patients - Check your Weight same time everyday, if you gain over 2 pounds, or you develop in leg swelling, experience more shortness of breath or chest pain, call your Primary MD immediately. Follow Cardiac Low Salt Diet and 1.5 lit/day fluid restriction.   On your next visit with your primary care physician please Get Medicines reviewed and adjusted.   Please request your Prim.MD to go over all Hospital Tests and Procedure/Radiological results at the follow up, please get all Hospital records sent to your Prim MD by signing hospital release before you go home.   If you experience worsening of your admission symptoms, develop shortness of breath, life threatening emergency, suicidal or homicidal thoughts you must seek medical attention immediately  by calling 911 or calling your MD immediately  if  symptoms less severe.  You Must read complete instructions/literature along with all the possible adverse reactions/side effects for all the Medicines you take and that have been prescribed to you. Take any new Medicines after you have completely understood and accpet all the possible adverse reactions/side effects.   Do not drive, operating heavy machinery, perform activities at heights, swimming or participation in water activities or provide baby sitting services if your were admitted for syncope or siezures until you have seen by Primary MD or a Neurologist and advised to do so again.  Do not drive when taking Pain medications.    Do not take more than prescribed Pain, Sleep and Anxiety Medications  Special Instructions: If you have smoked or chewed Tobacco  in the last 2 yrs please stop smoking, stop any regular Alcohol  and or any Recreational drug use.  Wear Seat belts while driving.   Please note  You were cared for by a hospitalist during your hospital stay. If you have any questions about your discharge medications or the care you received while you were in the hospital after you are discharged, you can call the unit and asked to speak with the hospitalist on call if the hospitalist that took care of you is not available. Once you are discharged, your primary care physician will handle any further medical issues. Please note that NO REFILLS for any discharge medications will be authorized once you are discharged, as it is imperative that you return to your primary care physician (or establish a relationship with a primary care physician if you do not have one) for your aftercare needs so that they can reassess your need for medications and monitor your lab values.   Negative Pressure Wound Therapy - Incisional    Complete by:  As directed    Discharged with the portable Prevena wound VAC pump. This should last one week. Once the pump stops working remove the dressing and sponge and  applied the stump shrinker.     Allergies as of 02/20/2017      Reactions   Ciprofloxacin Itching   Epinephrine Palpitations      Medication List    STOP taking these medications   insulin lispro 100 UNIT/ML injection Commonly known as:  HUMALOG   levonorgestrel 20 MCG/24HR IUD Commonly known as:  MIRENA   oxyCODONE-acetaminophen 5-325 MG tablet Commonly known as:  ROXICET     TAKE these medications   acetaminophen 325 MG tablet Commonly known as:  TYLENOL Take 2 tablets (650 mg total) by mouth every 6 (six) hours as needed for mild pain (or Fever >/= 101).   albuterol 108 (90 Base) MCG/ACT inhaler Commonly known as:  PROVENTIL HFA;VENTOLIN HFA Inhale 2 puffs into the lungs every 6 (six) hours as needed for wheezing or shortness of breath.   aspirin EC 81 MG tablet Take 81 mg by mouth daily.   ceFAZolin 2 g in dextrose 5 % 100 mL ivpb Inject 2 g into the vein every Monday, Wednesday, and Friday with hemodialysis. To be given after hemodialysis on for total of 2 doses this coming Wednesday and Friday, then stop Start taking on:  02/21/2017   docusate sodium 100 MG capsule Commonly known as:  COLACE Take 1 capsule (100 mg total) by mouth daily. What changed:  when to take this  reasons to take this   feeding supplement (PRO-STAT SUGAR FREE 64) Liqd Take 30 mLs by  mouth 2 (two) times daily.   hydrocerin Crea Apply 1 application topically 2 (two) times daily.   insulin aspart 100 UNIT/ML injection Commonly known as:  novoLOG Inject 0-9 Units into the skin 3 (three) times daily with meals.   insulin glargine 100 UNIT/ML injection Commonly known as:  LANTUS Inject 0.28 mLs (28 Units total) into the skin 2 (two) times daily. What changed:  how much to take   levothyroxine 88 MCG tablet Commonly known as:  SYNTHROID, LEVOTHROID Take 1 tablet (88 mcg total) by mouth daily before breakfast.   midodrine 10 MG tablet Commonly known as:  PROAMATINE Take 1 tablet  (10 mg total) by mouth daily. What changed:  when to take this   MULTIVITAMIN ADULT Chew Chew 1 tablet by mouth daily.   OXYGEN 2lpm 24/7- [facility]   polyethylene glycol packet Commonly known as:  MIRALAX / GLYCOLAX Take 17 g by mouth daily as needed for mild constipation.   senna 8.6 MG tablet Commonly known as:  SENOKOT Take 2 tablets (17.2 mg total) by mouth at bedtime as needed for constipation.   sevelamer carbonate 2.4 g Pack Commonly known as:  RENVELA Take 2.4 g by mouth 3 (three) times daily with meals.         Diet and Activity recommendation: See Discharge Instructions above   Consults obtained -  Nephrology Orthopedics   Major procedures and Radiology Reports - PLEASE review detailed and final reports for all details, in brief -   Transtibial amputation and Application of Prevena wound VAC by Dr. Lajoyce Corners on 02/16/2017   Ir Removal Tun Cv Cath W/o Fl  Result Date: 02/15/2017 INDICATION: History of end-stage renal disease, now with functional dialysis graft. No longer in need of tunneled dialysis catheter. The dialysis catheter was placed at an outside institution. EXAM: REMOVAL OF TUNNELED HEMODIALYSIS CATHETER MEDICATIONS: None COMPLICATIONS: None immediate. PROCEDURE: Informed written consent was obtained from the patient following an explanation of the procedure, risks, benefits and alternatives to treatment. A time out was performed prior to the initiation of the procedure. Maximal barrier sterile technique was utilized including caps, mask, sterile gowns, sterile gloves, large sterile drape, hand hygiene, and Betadine. 1% lidocaine with epinephrine was injected under sterile conditions along the subcutaneous tunnel. Utilizing a combination of blunt dissection and gentle traction, the catheter was removed intact. Hemostasis was obtained with manual compression. A dressing was placed. The patient tolerated the procedure well without immediate post procedural  complication. IMPRESSION: Successful removal of tunneled dialysis catheter. Electronically Signed   By: Simonne Come M.D.   On: 02/15/2017 14:06   Dg Chest Port 1 View  Result Date: 02/18/2017 CLINICAL DATA:  Shortness of breath and hypoxia. EXAM: PORTABLE CHEST 1 VIEW COMPARISON:  02/14/2017 and prior exams FINDINGS: Cardiomegaly again noted. Mild interstitial opacities are slightly decreased since 02/14/2017. There is no evidence of pleural effusion or pneumothorax. A left central venous catheter has been removed since the prior study. IMPRESSION: Slightly improved mild interstitial opacities/edema since 02/14/2017. Cardiomegaly. Electronically Signed   By: Harmon Pier M.D.   On: 02/18/2017 11:43   Dg Chest Portable 1 View  Result Date: 02/14/2017 CLINICAL DATA:  Chest pain. EXAM: PORTABLE CHEST 1 VIEW COMPARISON:  09/11/2016 FINDINGS: Double-lumen dialysis catheter in place, unchanged. There is slight haziness in the lungs suggesting mild interstitial edema. Pulmonary vascularity is normal. No effusions. No pneumothorax. IMPRESSION: Possible mild interstitial edema primarily on the left. Electronically Signed   By: Francene Boyers M.D.  On: 02/14/2017 13:08   Dg Foot Complete Left  Result Date: 02/14/2017 CLINICAL DATA:  Left foot abscess.  Diabetes. EXAM: LEFT FOOT - COMPLETE 3+ VIEW COMPARISON:  None. FINDINGS: There is extensive air in the soft tissues of left foot consistent with gas gangrene. Some bone detail is obscured by the gas. The gas is most prominent at the level of the metatarsal phalangeal joints with does extend into the midfoot. There is no discrete osteomyelitis. IMPRESSION: Extensive gas gangrene.  No discrete osteomyelitis. Electronically Signed   By: Francene Boyers M.D.   On: 02/14/2017 13:10    Micro Results    Recent Results (from the past 240 hour(s))  Culture, blood (Routine X 2) w Reflex to ID Panel     Status: Abnormal   Collection Time: 02/14/17 10:56 AM  Result Value  Ref Range Status   Specimen Description BLOOD LEFT HAND  Final   Special Requests IN PEDIATRIC BOTTLE Blood Culture adequate volume  Final   Culture  Setup Time   Final    GRAM NEGATIVE RODS IN PEDIATRIC BOTTLE CRITICAL RESULT CALLED TO, READ BACK BY AND VERIFIED WITH: Merlene Morse PHARMD, AT [phone] 02/15/17 BY D.VANHOOK    Culture CITROBACTER KOSERI (A)  Final   Report Status 02/17/2017 FINAL  Final   Organism ID, Bacteria CITROBACTER KOSERI  Final      Susceptibility   Citrobacter koseri - MIC*    CEFAZOLIN <=4 SENSITIVE Sensitive     CEFEPIME <=1 SENSITIVE Sensitive     CEFTAZIDIME <=1 SENSITIVE Sensitive     CEFTRIAXONE <=1 SENSITIVE Sensitive     CIPROFLOXACIN <=0.25 SENSITIVE Sensitive     GENTAMICIN <=1 SENSITIVE Sensitive     IMIPENEM <=0.25 SENSITIVE Sensitive     TRIMETH/SULFA <=20 SENSITIVE Sensitive     PIP/TAZO <=4 SENSITIVE Sensitive     * CITROBACTER KOSERI  Blood Culture ID Panel (Reflexed)     Status: Abnormal   Collection Time: 02/14/17 10:56 AM  Result Value Ref Range Status   Enterococcus species NOT DETECTED NOT DETECTED Final   Listeria monocytogenes NOT DETECTED NOT DETECTED Final   Staphylococcus species NOT DETECTED NOT DETECTED Final   Staphylococcus aureus NOT DETECTED NOT DETECTED Final   Streptococcus species NOT DETECTED NOT DETECTED Final   Streptococcus agalactiae NOT DETECTED NOT DETECTED Final   Streptococcus pneumoniae NOT DETECTED NOT DETECTED Final   Streptococcus pyogenes NOT DETECTED NOT DETECTED Final   Acinetobacter baumannii NOT DETECTED NOT DETECTED Final   Enterobacteriaceae species DETECTED (A) NOT DETECTED Final    Comment: Enterobacteriaceae represent a large family of gram negative bacteria, not a single organism. Refer to culture for further identification. CRITICAL RESULT CALLED TO, READ BACK BY AND VERIFIED WITH: Merlene Morse PHARMD, AT [phone] 02/15/17 BY D.VANHOOK    Enterobacter cloacae complex NOT DETECTED NOT DETECTED Final   Escherichia  coli NOT DETECTED NOT DETECTED Final   Klebsiella oxytoca NOT DETECTED NOT DETECTED Final   Klebsiella pneumoniae NOT DETECTED NOT DETECTED Final   Proteus species NOT DETECTED NOT DETECTED Final   Serratia marcescens NOT DETECTED NOT DETECTED Final   Carbapenem resistance NOT DETECTED NOT DETECTED Final   Haemophilus influenzae NOT DETECTED NOT DETECTED Final   Neisseria meningitidis NOT DETECTED NOT DETECTED Final   Pseudomonas aeruginosa NOT DETECTED NOT DETECTED Final   Candida albicans NOT DETECTED NOT DETECTED Final   Candida glabrata NOT DETECTED NOT DETECTED Final   Candida krusei NOT DETECTED NOT DETECTED Final   Candida  parapsilosis NOT DETECTED NOT DETECTED Final   Candida tropicalis NOT DETECTED NOT DETECTED Final  Culture, blood (Routine X 2) w Reflex to ID Panel     Status: None   Collection Time: 02/14/17 11:30 AM  Result Value Ref Range Status   Specimen Description BLOOD LEFT HAND  Final   Special Requests   Final    BOTTLES DRAWN AEROBIC AND ANAEROBIC Blood Culture adequate volume   Culture NO GROWTH 5 DAYS  Final   Report Status 02/19/2017 FINAL  Final  MRSA PCR Screening     Status: Abnormal   Collection Time: 02/14/17  2:26 PM  Result Value Ref Range Status   MRSA by PCR POSITIVE (A) NEGATIVE Final    Comment:        The GeneXpert MRSA Assay (FDA approved for NASAL specimens only), is one component of a comprehensive MRSA colonization surveillance program. It is not intended to diagnose MRSA infection nor to guide or monitor treatment for MRSA infections. RESULT CALLED TO, READ BACK BY AND VERIFIED WITH: Ronnald Nian, RN [phone] 02/14/2017 T.TYSOR        Today   Subjective:   Rosaland Lao today has no headache,no chest or abdominal pain, feels much better  today.   Objective:   Blood pressure (!) 112/42, pulse 91, temperature 97.3 F (36.3 C), temperature source Oral, resp. rate 17, height 5\' 4"  (1.626 m), weight 108.5 kg (239 lb 3.2 oz), SpO2 100  %.   Intake/Output Summary (Last 24 hours) at 02/20/17 1251 Last data filed at 02/20/17 9528  Gross per 24 hour  Intake              530 ml  Output             2699 ml  Net            -2169 ml    Exam   Awake alert oriented, no apparent distress, sitting on hemodialysis chair during dialysis Good air entry anteriorly bilaterally, with no wheezing  Heart regular rate and rhythm, no rubs murmurs gallops Soft, nontender, nondistended, bowel sounds present Left leg BKA, with one track in place, right leg with no edema.  Data Review   CBC w Diff: Lab Results  Component Value Date   WBC 15.5 (H) 02/19/2017   HGB 9.7 (L) 02/19/2017   HCT 31.4 (L) 02/19/2017   PLT 239 02/19/2017   LYMPHOPCT 9 02/17/2017   MONOPCT 7 02/17/2017   EOSPCT 1 02/17/2017   BASOPCT 1 02/17/2017    CMP: Lab Results  Component Value Date   NA 132 (L) 02/19/2017   NA 134 (A) 10/06/2016   K 5.2 (H) 02/19/2017   CL 93 (L) 02/19/2017   CO2 21 (L) 02/19/2017   BUN 65 (H) 02/19/2017   BUN 24 (A) 10/06/2016   CREATININE 7.21 (H) 02/19/2017   GLU 136 09/19/2016   PROT 7.7 02/15/2017   ALBUMIN 2.1 (L) 02/19/2017   BILITOT 1.4 (H) 02/15/2017   ALKPHOS 306 (H) 02/15/2017   AST 26 02/15/2017   ALT 23 02/15/2017  .   Total Time in preparing paper work, data evaluation and todays exam - 35 minutes  Marcie Shearon M.D on 02/20/2017 at 12:51 PM  [facility]   Office  [phone]

## 2017-02-20 NOTE — Progress Notes (Signed)
Wound vac changed over to preevena wound vac prior to discharge.

## 2017-02-20 NOTE — NC FL2 (Signed)
[place] MEDICAID FL2 LEVEL OF CARE SCREENING TOOL     IDENTIFICATION  Patient Name: Kimberly [name] Birthdate: May 03, 1956 Sex: female Admission Date (Current Location): 02/14/2017  [facility] and [place] Number:  Producer, television/film/video and Address:  [facility], [address] [place], [place] [address]      Provider Number: [id number]  Attending Physician Name and Address:  Elgergawy, Leana Roe, MD  Relative Name and Phone Number:  Karle Starch - son; [phone]    Current Level of Care: Hospital Recommended Level of Care: Skilled Nursing Facility Prior Approval Number:    Date Approved/Denied:   PASRR Number: [id number]  Discharge Plan: SNF    Current Diagnoses: Patient Active Problem List   Diagnosis Date Noted  . Gangrene of left foot (HCC)   . Congestive heart failure (CHF) (HCC)   . Diabetes mellitus with complication (HCC)   . Cellulitis of left foot 02/14/2017  . Sepsis (HCC) 02/14/2017  . Diabetes mellitus, type II, insulin dependent (HCC)   . Pressure injury of skin 08/29/2016  . Cellulitis of leg 08/29/2016  . Chest pain 08/28/2016  . Elevated troponin 08/28/2016  . 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 Hyperkalemia 08/23/2016  . Shortness of breath 08/23/2016  . Hyponatremia 08/23/2016  . Hypotension arterial 09/30/2015  . LBBB (left bundle branch block) 09/30/2015  . Complex endometrial hyperplasia with atypia 09/02/2015  . Severe obesity (HCC) 07/29/2015  . Hypotension 07/20/2015  . Uncontrolled type 2 diabetes mellitus (HCC) 07/20/2015  . Vaginal bleeding   . Altered mental status   . Pulmonary arterial hypertension (HCC)   . Paroxysmal atrial fibrillation (HCC) 07/11/2015  . ESRD (end stage renal disease) (HCC) 07/08/2015  . Skin ulcer of left foot with necrosis of muscle (HCC)   . Pressure ulcer 04/07/2015  . Obesity hypoventilation syndrome (HCC) 08/05/2014  . Morbid obesity (HCC) 06/28/2014  . Physical deconditioning  06/28/2014  . Hypothyroidism 06/10/2014  . Carotid stenosis 12/02/2013  . Varicose veins of lower extremities with other complications 12/02/2013  . Chronic respiratory failure with hypoxia (HCC) 10/13/2013  . On home oxygen therapy 10/13/2013  . Chronic diastolic CHF (congestive heart failure) (HCC) 09/12/2013  . CAD (coronary artery disease), native coronary artery   . DM (diabetes mellitus), type 2 with renal complications (HCC) 05/31/2010  . Hyperlipidemia associated with type 2 diabetes mellitus (HCC) 05/31/2010  . COPD (chronic obstructive pulmonary disease) (HCC) 05/31/2010  . Hypertensive heart disease     Orientation RESPIRATION BLADDER Height & Weight     Self, Time, Situation, Place  O2 (2 Liters Oxygen) Continent Weight: 239 lb 3.2 oz (108.5 kg) Height:  5\' 4"  (162.6 cm)  BEHAVIORAL SYMPTOMS/MOOD NEUROLOGICAL BOWEL NUTRITION STATUS      Incontinent Diet (Renal/carb modified with fluid restriction)  AMBULATORY STATUS COMMUNICATION OF NEEDS Skin   Total Care (Patient s/p transtibial amputation, did not ambulate with PT) Verbally Other (Comment) (Stage 2 pressure injury to sacrum. Incision left leg, cellulitis right leg)                       Personal Care Assistance Level of Assistance  Bathing, Feeding, Dressing Bathing Assistance: Maximum assistance Feeding assistance: Independent Dressing Assistance: Maximum assistance     Functional Limitations Info  Sight, Hearing, Speech Sight Info: Adequate Hearing Info: Impaired Speech Info: Adequate    SPECIAL CARE FACTORS FREQUENCY  PT (By licensed PT), OT (By licensed OT)     PT Frequency:  Evaluated 6/10 and a minimum of 3X per week therapy recommended OT Frequency: Evaluated 6/7 and a minimum of 2X per week therapy recommended            Contractures Contractures Info: Not present    Additional Factors Info  Code Status, Allergies, Insulin Sliding Scale Code Status Info: DNR Allergies Info:  Ciprofloxacin, Epinephrine   Insulin Sliding Scale Info: 0-9 Units 3X daily with meals       Current Medications (02/20/2017):  This is the current hospital active medication list Current Facility-Administered Medications  Medication Dose Route Frequency Provider Last Rate Last Dose  . acetaminophen (TYLENOL) tablet 650 mg  650 mg Oral Q6H PRN Marcos EkeWertman, Sara E, PA-C       Or  . acetaminophen (TYLENOL) suppository 650 mg  650 mg Rectal Q6H PRN Marcos EkeWertman, Sara E, PA-C      . albuterol (PROVENTIL) (2.5 MG/3ML) 0.083% nebulizer solution 2.5 mg  2.5 mg Inhalation Q2H PRN Lonia BloodMcClung, Jeffrey T, MD      . aspirin EC tablet 81 mg  81 mg Oral Daily Marcos EkeWertman, Sara E, PA-C   81 mg at 02/20/17 0811  . bisacodyl (DULCOLAX) suppository 10 mg  10 mg Rectal Daily PRN Nadara Mustarduda, Marcus V, MD      . cefTRIAXone (ROCEPHIN) 2 g in dextrose 5 % 50 mL IVPB  2 g Intravenous Q24H Lonia BloodMcClung, Jeffrey T, MD 100 mL/hr at 02/20/17 1419 2 g at 02/20/17 1419  . diphenhydrAMINE (BENADRYL) capsule 25 mg  25 mg Oral Q6H PRN Madelyn FlavorsSmith, Rondell A, MD   25 mg at 02/17/17 0608  . docusate sodium (COLACE) capsule 100 mg  100 mg Oral BID Nadara Mustarduda, Marcus V, MD   100 mg at 02/19/17 2124  . feeding supplement (PRO-STAT SUGAR FREE 64) liquid 30 mL  30 mL Oral BID Tomasa Blasejigiri, Ogechi Grace, PA-C   30 mL at 02/19/17 2123  . heparin injection 5,000 Units  5,000 Units Subcutaneous Q8H Marcos EkeWertman, Sara E, PA-C   5,000 Units at 02/20/17 1419  . insulin aspart (novoLOG) injection 0-9 Units  0-9 Units Subcutaneous TID WC Marcos EkeWertman, Sara E, PA-C   3 Units at 02/20/17 1243  . insulin glargine (LANTUS) injection 24 Units  24 Units Subcutaneous BID Lonia BloodMcClung, Jeffrey T, MD   24 Units at 02/20/17 [phone]  . levothyroxine (SYNTHROID, LEVOTHROID) tablet 88 mcg  88 mcg Oral QAC breakfast Marcos EkeWertman, Sara E, PA-C   88 mcg at 02/20/17 [id number]  . midodrine (PROAMATINE) tablet 10 mg  10 mg Oral Daily Drema DallasWoods, Curtis J, MD   10 mg at 02/20/17 0811  . multivitamin (RENA-VIT) tablet 1 tablet  1 tablet  Oral QHS Tomasa BlaseEjigiri, Ogechi Grace, PA-C   1 tablet at 02/19/17 2124  . ondansetron (ZOFRAN) tablet 4 mg  4 mg Oral Q6H PRN Marcos EkeWertman, Sara E, PA-C       Or  . ondansetron [facility](ZOFRAN) injection 4 mg  4 mg Intravenous Q6H PRN Marlowe KaysWertman, Sara E, PA-C   4 mg at 02/16/17 1712  . polyethylene glycol (MIRALAX / GLYCOLAX) packet 17 g  17 g Oral Daily Lonia BloodMcClung, Jeffrey T, MD   17 g at 02/19/17 1443  . senna (SENOKOT) tablet 17.2 mg  17.2 mg Oral QHS PRN Marcos EkeWertman, Sara E, PA-C   17.2 mg at 02/19/17 1443  . sevelamer carbonate (RENVELA) powder PACK 2.4 g  2.4 g Oral TID WC Tomasa BlaseEjigiri, Ogechi Grace, PA-C   2.4 g at 02/20/17 [id number]     Discharge Medications: Please  see discharge summary for a list of discharge medications.  Relevant Imaging Results:  Relevant Lab Results:   Additional Information ss#[id number]. Dialysis patient MWF Awilda Bill, [place], [place]

## 2017-02-20 NOTE — Progress Notes (Signed)
[facility] [place] [facility] at this time.

## 2017-02-20 NOTE — Clinical Social Work Placement (Signed)
   CLINICAL SOCIAL WORK PLACEMENT  NOTE 02/20/17 - DISCHARGED TO [facility] VIA AMBULANCE  Date:  02/20/2017  Patient Details  Name: Kimberly ManilaSusan K Barkow MRN: [id number] Date of Birth: 1956/06/21  Clinical Social Work is seeking post-discharge placement for this patient at the Skilled  Nursing Facility level of care (*CSW will initial, date and re-position this form in  chart as items are completed):  Yes   Patient/family provided with [place] Clinical Social Work Department's list of facilities offering this level of care within the geographic area requested by the patient (or if unable, by the patient's family).  Yes   Patient/family informed of their freedom to choose among providers that offer the needed level of care, that participate in Medicare, Medicaid or managed care program needed by the patient, have an available bed and are willing to accept the patient.  Yes   Patient/family informed of [facility] ownership interest in [facility] and [facility], as well as of the fact that they are under no obligation to receive care at these facilities.  PASRR submitted to EDS on       PASRR number received on       Existing PASRR number confirmed on 02/16/17     FL2 transmitted to all facilities in geographic area requested by pt/family on 02/16/17     FL2 transmitted to all facilities within larger geographic area on       Patient informed that his/her managed care company has contracts with or will negotiate with certain facilities, including the following:        Yes   Patient/family informed of bed offers received.  [facility]     Physician recommends and patient chooses bed at      [facility] on 02/21/17.  Patient to be transferred to facility by Ambulance     Patient family notified on 02/20/17 of transfer.  Name of family member notified:   Karle StarchJames Bolling, son - [phone]      PHYSICIAN       Additional Comment:    _______________________________________________ Cristobal Goldmannrawford, Tilman Mcclaren Bradley, [name] 02/20/2017, 3:20 PM

## 2017-02-20 NOTE — Consult Note (Signed)
   [facility] Consult   02/20/2017  [name] Jan 15, 1956 [id number]   Patient was assess for post hospital follow up needs.  Patient is currently being discharged to a skilled facility for rehab. Patient is a 61 year old admitted with gangrene of the left foot and underwent a transtibial amputation.  HX of diabetes, HF, COPD, obesity with HX of stroke. Patient also has HD. No current community [facility] Care Management needs at this time.  [facility] and Dr. Pete GlatterStoneking which provides the post facility transition of care follow up.   For questions, please contact:  Charlesetta ShanksVictoria Violet Cart, RN BSN CCM [facility] Liaison  [phone] business mobile phone Toll free office [phone]

## 2017-02-20 NOTE — Care Management Note (Signed)
Case Management Note  Patient Details  Name: [name] MRN: [id number] Date of Birth: [birth year]  Subjective/Objective:             CM following for progression and d/c planning.        Action/Plan: 02/20/2017 Pt for d/c to SNF for short term rehab, [facility] per [place], Alvina ChouV Crawford.   Expected Discharge Date:    02/20/2017              Expected Discharge Plan:  Skilled Nursing Facility  In-House Referral:  Clinical Social Work  Discharge planning Services  NA  Post Acute Care Choice:  NA Choice offered to:  NA  DME Arranged:   NA DME Agency:   NA  HH Arranged:   NA HH Agency:   NA  Status of Service:  Completed, signed off  If discussed at [employer] Length of Stay Meetings, dates discussed:    Additional Comments:  Starlyn SkeansRoyal, Elek Holderness U, RN 02/20/2017, 12:21 PM

## 2017-02-20 NOTE — Progress Notes (Signed)
[facility] Progress Note   Subjective: "I'm going to a nursing home to learn how to use a prosthetic leg".    Objective Vitals:   02/19/17 1409 02/19/17 1710 02/20/17 0506 02/20/17 0924  BP: (!) 95/45 (!) 95/43 (!) 103/43 (!) 112/42  Pulse: 79 86 89 91  Resp: 12 15 18 17   Temp: 97.7 F (36.5 C) 98.7 F (37.1 C) 98 F (36.7 C) 97.3 F (36.3 C)  TempSrc: Oral Oral Oral Oral  SpO2: 100% 100% 100% 100%  Weight: 108.5 kg (239 lb 3.2 oz)     Height:       Physical Exam General: Pleasant, Obese female in NAD Heart: HS distant, S1,S2, RRR Lungs: CTAB A/P sl decrease in bases posteriorly. No WOB Abdomen: active BS, Non-tender Extremities: L BKA with wound vac. R leg with woody appearance trace edema. Still trace edema in pannus, none R BKA. Dialysis Access: RUA AVG + bruit   Additional Objective Labs: Basic Metabolic Panel:  Recent Labs Lab 02/16/17 0803 02/16/17 1618 02/17/17 0354 02/19/17 0455  NA 129* 130* 132* 132*  K 5.1 4.4 4.9 5.2*  CL 92*  --  92* 93*  CO2 22  --  23 21*  GLUCOSE 145* 154* 210* 140*  BUN 49*  --  36* 65*  CREATININE 5.70*  --  4.67* 7.21*  CALCIUM 9.0  --  9.1 8.7*  PHOS 7.0*  --   --  9.2*   Liver Function Tests:  Recent Labs Lab 02/14/17 0957 02/15/17 0310 02/16/17 0803 02/19/17 0455  AST 25 26  --   --   ALT 26 23  --   --   ALKPHOS 303* 306*  --   --   BILITOT 0.9 1.4*  --   --   PROT 8.8* 7.7  --   --   ALBUMIN 2.6* 2.2* 2.2* 2.1*   No results for input(s): LIPASE, AMYLASE in the last 168 hours. CBC:  Recent Labs Lab 02/14/17 0957 02/15/17 0310 02/16/17 0519  02/17/17 0354 02/18/17 0344 02/19/17 0455  WBC 22.8* 17.9* 17.8*  --  13.3* 13.4* 15.5*  NEUTROABS 20.7*  --   --   --  11.0*  --   --   HGB 9.1* 7.6* 8.6*  < > 9.7* 9.8* 9.7*  HCT 28.9* 25.1* 27.7*  < > 30.9* 31.9* 31.4*  MCV 101.4* 101.2* 100.4*  --  101.3* 101.9* 101.6*  PLT 240 215 215  --  216 223 239  < > = values in this interval not  displayed. Blood Culture    Component Value Date/Time   SDES BLOOD LEFT HAND 02/14/2017 1130   SPECREQUEST  02/14/2017 1130    BOTTLES DRAWN AEROBIC AND ANAEROBIC Blood Culture adequate volume   CULT NO GROWTH 5 DAYS 02/14/2017 1130   REPTSTATUS 02/19/2017 FINAL 02/14/2017 1130    Cardiac Enzymes: No results for input(s): CKTOTAL, CKMB, CKMBINDEX, TROPONINI in the last 168 hours. CBG:  Recent Labs Lab 02/18/17 2050 02/19/17 0751 02/19/17 1710 02/19/17 2200 02/20/17 0725  GLUCAP 182* 141* 175* 221* 207*   Iron Studies: No results for input(s): IRON, TIBC, TRANSFERRIN, FERRITIN in the last 72 hours. @lablastinr3 @ Studies/Results: No results found. Medications: . cefTRIAXone (ROCEPHIN)  IV Stopped (02/19/17 1552)   . aspirin EC  81 mg Oral Daily  . docusate sodium  100 mg Oral BID  . feeding supplement (PRO-STAT SUGAR FREE 64)  30 mL Oral BID  . heparin  5,000 Units Subcutaneous Q8H  .  insulin aspart  0-9 Units Subcutaneous TID WC  . insulin glargine  24 Units Subcutaneous BID  . levothyroxine  88 mcg Oral QAC breakfast  . midodrine  10 mg Oral Daily  . multivitamin  1 tablet Oral QHS  . polyethylene glycol  17 g Oral Daily  . sevelamer carbonate  2.4 g Oral TID WC   Dialysis: [facility] MWF 4.5h   113.5kg   2/2.25 bath   TDC (now removed)/ AVG RUE   Hep 6000 w 5000 midrun prn VDRA: none EPO: Miercera q2wk, last 5/30 IV Fe: Currently has rec 3/5 Venofer 100mg  qTx1 Midodrine used preHD  Assessment/Plan: 1. 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 Lajoyce Corners. Wound vac in place. No issues.  2. ESRD -MWF. Next HD tomorrow, in OP center.  3. Anemia - Hgb 9.7 . Increase OP mircera.  4. Secondary hyperparathyroidism - cont binders, VDRA.  5. HTN/volume - BP controlled. Last HD  02/19/17 Pre wt 111 kg Net UF 2699 Post wt 108.5 kg. Lower EDW on DC.  6. Nutrition - Albumin 2.1-recent infection and probably existing protein calorie nutrition in the setting  of obesity. Renal/Carb mod diet/prostat/renal vits. 7. DM: per primary  Disposition: Plan for DC today to [facility].   Rita H. Brown NP-C 02/20/2017, 11:54 AM  [facility] [phone]  Pt seen, examined and agree w A/P as above.  [name] MD [employer] pager [phone]   02/20/2017, 1:54 PM

## 2017-02-21 ENCOUNTER — Telehealth (INDEPENDENT_AMBULATORY_CARE_PROVIDER_SITE_OTHER): Payer: Self-pay | Admitting: Radiology

## 2017-02-21 DIAGNOSIS — S91309A Unspecified open wound, unspecified foot, initial encounter: Secondary | ICD-10-CM | POA: Diagnosis not present

## 2017-02-21 DIAGNOSIS — E119 Type 2 diabetes mellitus without complications: Secondary | ICD-10-CM | POA: Diagnosis not present

## 2017-02-21 DIAGNOSIS — N186 End stage renal disease: Secondary | ICD-10-CM | POA: Diagnosis not present

## 2017-02-21 DIAGNOSIS — N2581 Secondary hyperparathyroidism of renal origin: Secondary | ICD-10-CM | POA: Diagnosis not present

## 2017-02-21 DIAGNOSIS — D631 Anemia in chronic kidney disease: Secondary | ICD-10-CM | POA: Diagnosis not present

## 2017-02-21 DIAGNOSIS — D509 Iron deficiency anemia, unspecified: Secondary | ICD-10-CM | POA: Diagnosis not present

## 2017-02-21 DIAGNOSIS — R2689 Other abnormalities of gait and mobility: Secondary | ICD-10-CM | POA: Diagnosis not present

## 2017-02-21 NOTE — Telephone Encounter (Signed)
Kimberly [name] called LMVM triage that the [place] wound vac is not on properly and she wants to know what to do, or if she can remove it, please call her.  Thanks-

## 2017-02-21 NOTE — Telephone Encounter (Signed)
I called and sw the nurse. The pt is s/p a left BKA on 02/16/17. The nurse advised that there appears to be some drainage under the adhesive and that her director of nursing looked at it and said the seal was fine but the nurse is afraid that the fluid will cause maceration if the pt continues to wear. I advised that she can remove the vac and apply 4x4 , kerlix and ace bandage and that they can change this daily and will write order for a shrinker when she has her appt on the 19th. Nurse states that she will leave the vac on and continue to monitor "because the DON said it looked ok" and the pt is sch to have this removed on Friday.  If she continues to have concern she will remove with the above wound care orders. To call with any questions.

## 2017-02-23 DIAGNOSIS — D509 Iron deficiency anemia, unspecified: Secondary | ICD-10-CM | POA: Diagnosis not present

## 2017-02-23 DIAGNOSIS — D631 Anemia in chronic kidney disease: Secondary | ICD-10-CM | POA: Diagnosis not present

## 2017-02-23 DIAGNOSIS — S91309A Unspecified open wound, unspecified foot, initial encounter: Secondary | ICD-10-CM | POA: Diagnosis not present

## 2017-02-23 DIAGNOSIS — N2581 Secondary hyperparathyroidism of renal origin: Secondary | ICD-10-CM | POA: Diagnosis not present

## 2017-02-23 DIAGNOSIS — E119 Type 2 diabetes mellitus without complications: Secondary | ICD-10-CM | POA: Diagnosis not present

## 2017-02-23 DIAGNOSIS — N186 End stage renal disease: Secondary | ICD-10-CM | POA: Diagnosis not present

## 2017-02-26 DIAGNOSIS — D509 Iron deficiency anemia, unspecified: Secondary | ICD-10-CM | POA: Diagnosis not present

## 2017-02-26 DIAGNOSIS — N186 End stage renal disease: Secondary | ICD-10-CM | POA: Diagnosis not present

## 2017-02-26 DIAGNOSIS — N2581 Secondary hyperparathyroidism of renal origin: Secondary | ICD-10-CM | POA: Diagnosis not present

## 2017-02-26 DIAGNOSIS — E119 Type 2 diabetes mellitus without complications: Secondary | ICD-10-CM | POA: Diagnosis not present

## 2017-02-26 DIAGNOSIS — D631 Anemia in chronic kidney disease: Secondary | ICD-10-CM | POA: Diagnosis not present

## 2017-02-26 DIAGNOSIS — Z89511 Acquired absence of right leg below knee: Secondary | ICD-10-CM | POA: Diagnosis not present

## 2017-02-26 DIAGNOSIS — S91309A Unspecified open wound, unspecified foot, initial encounter: Secondary | ICD-10-CM | POA: Diagnosis not present

## 2017-02-26 DIAGNOSIS — N898 Other specified noninflammatory disorders of vagina: Secondary | ICD-10-CM | POA: Diagnosis not present

## 2017-02-27 ENCOUNTER — Telehealth (INDEPENDENT_AMBULATORY_CARE_PROVIDER_SITE_OTHER): Payer: Self-pay | Admitting: Orthopaedic Surgery

## 2017-02-27 ENCOUNTER — Ambulatory Visit (INDEPENDENT_AMBULATORY_CARE_PROVIDER_SITE_OTHER): Payer: Medicare Other | Admitting: Orthopedic Surgery

## 2017-02-27 ENCOUNTER — Encounter (INDEPENDENT_AMBULATORY_CARE_PROVIDER_SITE_OTHER): Payer: Self-pay | Admitting: Orthopedic Surgery

## 2017-02-27 VITALS — Ht 64.0 in | Wt 239.0 lb

## 2017-02-27 DIAGNOSIS — N186 End stage renal disease: Secondary | ICD-10-CM | POA: Diagnosis not present

## 2017-02-27 DIAGNOSIS — J449 Chronic obstructive pulmonary disease, unspecified: Secondary | ICD-10-CM | POA: Diagnosis not present

## 2017-02-27 DIAGNOSIS — Z89512 Acquired absence of left leg below knee: Secondary | ICD-10-CM

## 2017-02-27 DIAGNOSIS — L89610 Pressure ulcer of right heel, unstageable: Secondary | ICD-10-CM | POA: Diagnosis not present

## 2017-02-27 NOTE — Progress Notes (Signed)
Office Visit Note   Patient: Kimberly [name]           Date of Birth: 1956-03-07           MRN: [id number] Visit Date: 02/27/2017              Requested by: Merlene Laughter, MD [address] [place], [place] [address] PCP: Merlene Laughter, MD  Chief Complaint  Patient presents with  . Left Leg - Routine Post Op    02/16/17 left BKA with prevena vac      HPI: Patient is a 61 year old woman status post transtibial imitation the left she is diabetic insensate neuropathy end-stage renal disease on dialysis on nasal cannula FiO2.  Assessment & Plan: Visit Diagnoses:  1. Hx of BKA, left (HCC)     Plan: Patient is given a prescription for [employer] for a stump shrinker a transtibial prosthesis for a K2 level ambulator. She will have the leg washed with soap and water daily applied a stump shrinker daily directly against the skin evaluate for removal of the sutures and staples at follow-up in 2 weeks.  Follow-Up Instructions: Return in about 2 weeks (around 03/13/2017).   Ortho Exam  Patient is alert, oriented, no adenopathy, well-dressed, normal affect, normal respiratory effort. Examination patient has some mild ischemic changes along the surgical incision. There is no redness no cellulitis no odor no drainage no signs of infection. The wound edges are well approximated.  Imaging: No results found.  Labs: Lab Results  Component Value Date   HGBA1C 6.6 10/06/2016   HGBA1C 9.9 (H) 08/23/2016   HGBA1C 8.7 (H) 04/15/2015   ESRSEDRATE 57 (H) 07/12/2015   ESRSEDRATE 110 (H) 04/15/2015   CRP 6.0 (H) 07/12/2015   CRP 3.8 (H) 04/15/2015   REPTSTATUS 02/19/2017 FINAL 02/14/2017   GRAMSTAIN  06/22/2014    MODERATE WBC PRESENT,BOTH PMN AND MONONUCLEAR RARE SQUAMOUS EPITHELIAL CELLS PRESENT NO ORGANISMS SEEN Performed at [facility]   CULT NO GROWTH 5 DAYS 02/14/2017   LABORGA CITROBACTER KOSERI 02/14/2017    Orders:  No orders of the defined types were placed in  this encounter.  No orders of the defined types were placed in this encounter.    Procedures: No procedures performed  Clinical Data: No additional findings.  ROS:  All other systems negative, except as noted in the HPI. Review of Systems  Objective: Vital Signs: Ht 5\' 4"  (1.626 m)   Wt 239 lb (108.4 kg)   BMI 41.02 kg/m   Specialty Comments:  No specialty comments available.  PMFS History: Patient Active Problem List   Diagnosis Date Noted  . Hx of BKA, left (HCC) 02/27/2017  . Gangrene of left foot (HCC)   . Congestive heart failure (CHF) (HCC)   . Diabetes mellitus with complication (HCC)   . Cellulitis of left foot 02/14/2017  . Sepsis (HCC) 02/14/2017  . Diabetes mellitus, type II, insulin dependent (HCC)   . Pressure injury of skin 08/29/2016  . Cellulitis of leg 08/29/2016  . Chest pain 08/28/2016  . Elevated troponin 08/28/2016  . 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 Hyperkalemia 08/23/2016  . Shortness of breath 08/23/2016  . Hyponatremia 08/23/2016  . Hypotension arterial 09/30/2015  . LBBB (left bundle branch block) 09/30/2015  . Complex endometrial hyperplasia with atypia 09/02/2015  . Severe obesity (HCC) 07/29/2015  . Hypotension 07/20/2015  . Uncontrolled type 2 diabetes mellitus (HCC) 07/20/2015  . Vaginal bleeding   . Altered mental status   .  Pulmonary arterial hypertension (HCC)   . Paroxysmal atrial fibrillation (HCC) 07/11/2015  . ESRD (end stage renal disease) (HCC) 07/08/2015  . Skin ulcer of left foot with necrosis of muscle (HCC)   . Pressure ulcer 04/07/2015  . Obesity hypoventilation syndrome (HCC) 08/05/2014  . Morbid obesity (HCC) 06/28/2014  . Physical deconditioning 06/28/2014  . Hypothyroidism 06/10/2014  . Carotid stenosis 12/02/2013  . Varicose veins of lower extremities with other complications 12/02/2013  . Chronic respiratory failure with hypoxia (HCC) 10/13/2013  . On home oxygen therapy 10/13/2013  . Chronic diastolic CHF  (congestive heart failure) (HCC) 09/12/2013  . CAD (coronary artery disease), native coronary artery   . DM (diabetes mellitus), type 2 with renal complications (HCC) 05/31/2010  . Hyperlipidemia associated with type 2 diabetes mellitus (HCC) 05/31/2010  . COPD (chronic obstructive pulmonary disease) (HCC) 05/31/2010  . Hypertensive heart disease    Past Medical History:  Diagnosis Date  . Anemia   . Anxiety   . Arthritis    "knees, feet, hands" (08/23/2016)  . Asthma   . CAD, NATIVE VESSEL    May 10, 2010 cath showed a hyperdynamic LV function, she had dominant circumflex anatomy with a 70-80% small OM1. She had diffuse diabetic plaque particularly in the distal LAD. She nondominant RCA.  Nondominant  . Cellulitis 10/15/2013  . CHF (congestive heart failure) (HCC)    Preserved EF  . Chronic bronchitis (HCC)    "probably once/ yr" (08/23/2016)  . Complication of anesthesia    "I've had difficulty waking up" (08/23/2016)  . COPD   . Depression   . Diabetic neuropathy (HCC)   . Dyspnea   . Endometrial hyperplasia   . ESRD (end stage renal disease) on dialysis (HCC)    "[facility]; MWF; [facility]" (08/23/2016)  . Family history of adverse reaction to anesthesia    mother had hard time waking up  . GERD (gastroesophageal reflux disease)   . History of hiatal hernia   . HYPERLIPIDEMIA   . HYPERTENSION   . Hypothyroidism   . Myocardial infarct, old   . On home oxygen therapy    "2L; 24/7" (08/23/2016)  . Paralyzed vocal cords   . Peripheral neuropathy    hx/notes 01/27/2010  . Pneumonia "several times"  . Proliferative retinopathy    hx/notes 01/27/2010  . PVD    CEA  . Restless leg syndrome    "mostly on the right" (08/23/2016)  . 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 Type II diabetes mellitus (HCC)     Family History  Problem Relation Age of Onset    . Cancer Mother        Breast, NHL  . Stroke Mother   . Peripheral vascular disease Father   . CAD Father [id number]  . Heart attack Father   . Hypertension Father   . Asthma Father   . Heart disease Father        before age 62    Past Surgical History:  Procedure Laterality Date  . AMPUTATION Left 02/16/2017   Procedure: Left Below Knee Amputation;  Surgeon: Nadara Mustarduda, Windsor Zirkelbach V, MD;  [facility];  Service: Orthopedics;  Laterality: Left;  . AV FISTULA PLACEMENT Right 07/08/2015   Procedure: EXPLORATION RIGHT AXILLARY ARTERY AND RIGHT BRACHIAL VEIN;  Surgeon: Chuck Hinthristopher S Dickson, MD;  [facility];  Service: Vascular;  Laterality:  Right;  . AV FISTULA PLACEMENT Right 01/02/2017   Procedure: INSERTION OF ARTERIOVENOUS GORE-TEX  GRAFT  RIGHT ARM;  Surgeon: Chuck Hint, MD;  Location: [facility] OR;  Service: Vascular;  Laterality: Right;  . CAROTID ENDARTERECTOMY Left X 2  . CATARACT EXTRACTION W/ INTRAOCULAR LENS  IMPLANT, BILATERAL Bilateral   . CERVICAL BIOPSY  ~ 2015   "precancerous cells"  . COLONOSCOPY    . EYE SURGERY Bilateral    numerous surgeries  . INSERTION OF DIALYSIS CATHETER N/A 06/24/2015   Procedure: ULTRASOUND BILATERAL INTERNAL JUGULAR VEIN INSERTION OF DIALYSIS CATHETER LEFT INTERNAL JUGULAR VEIN ;  Surgeon: Pryor Ochoa, MD;  [facility];  Service: Vascular;  Laterality: N/A;  . INTRAUTERINE DEVICE INSERTION  ~ 2015  . IR REMOVAL TUN CV CATH W/O FL  02/15/2017  . VITRECTOMY Bilateral    Social History   Occupational History  . disabled    Social History Main Topics  . Smoking status: Former Smoker    Packs/day: 1.00    Years: 20.00    Types: Cigarettes    Quit date: 09/11/2005  . Smokeless tobacco: Never Used  . Alcohol use No  . Drug use: No  . Sexual activity: No

## 2017-03-01 DIAGNOSIS — N186 End stage renal disease: Secondary | ICD-10-CM | POA: Diagnosis not present

## 2017-03-01 DIAGNOSIS — N2581 Secondary hyperparathyroidism of renal origin: Secondary | ICD-10-CM | POA: Diagnosis not present

## 2017-03-01 DIAGNOSIS — D631 Anemia in chronic kidney disease: Secondary | ICD-10-CM | POA: Diagnosis not present

## 2017-03-01 DIAGNOSIS — S91309A Unspecified open wound, unspecified foot, initial encounter: Secondary | ICD-10-CM | POA: Diagnosis not present

## 2017-03-01 DIAGNOSIS — J449 Chronic obstructive pulmonary disease, unspecified: Secondary | ICD-10-CM | POA: Diagnosis not present

## 2017-03-01 DIAGNOSIS — I509 Heart failure, unspecified: Secondary | ICD-10-CM | POA: Diagnosis not present

## 2017-03-01 DIAGNOSIS — R2689 Other abnormalities of gait and mobility: Secondary | ICD-10-CM | POA: Diagnosis not present

## 2017-03-01 DIAGNOSIS — E119 Type 2 diabetes mellitus without complications: Secondary | ICD-10-CM | POA: Diagnosis not present

## 2017-03-01 DIAGNOSIS — D509 Iron deficiency anemia, unspecified: Secondary | ICD-10-CM | POA: Diagnosis not present

## 2017-03-01 DIAGNOSIS — E1129 Type 2 diabetes mellitus with other diabetic kidney complication: Secondary | ICD-10-CM | POA: Diagnosis not present

## 2017-03-02 DIAGNOSIS — S91309A Unspecified open wound, unspecified foot, initial encounter: Secondary | ICD-10-CM | POA: Diagnosis not present

## 2017-03-02 DIAGNOSIS — E119 Type 2 diabetes mellitus without complications: Secondary | ICD-10-CM | POA: Diagnosis not present

## 2017-03-02 DIAGNOSIS — D631 Anemia in chronic kidney disease: Secondary | ICD-10-CM | POA: Diagnosis not present

## 2017-03-02 DIAGNOSIS — D509 Iron deficiency anemia, unspecified: Secondary | ICD-10-CM | POA: Diagnosis not present

## 2017-03-02 DIAGNOSIS — N2581 Secondary hyperparathyroidism of renal origin: Secondary | ICD-10-CM | POA: Diagnosis not present

## 2017-03-02 DIAGNOSIS — N186 End stage renal disease: Secondary | ICD-10-CM | POA: Diagnosis not present

## 2017-03-05 DIAGNOSIS — E119 Type 2 diabetes mellitus without complications: Secondary | ICD-10-CM | POA: Diagnosis not present

## 2017-03-05 DIAGNOSIS — S91309A Unspecified open wound, unspecified foot, initial encounter: Secondary | ICD-10-CM | POA: Diagnosis not present

## 2017-03-05 DIAGNOSIS — D631 Anemia in chronic kidney disease: Secondary | ICD-10-CM | POA: Diagnosis not present

## 2017-03-05 DIAGNOSIS — N186 End stage renal disease: Secondary | ICD-10-CM | POA: Diagnosis not present

## 2017-03-05 DIAGNOSIS — D509 Iron deficiency anemia, unspecified: Secondary | ICD-10-CM | POA: Diagnosis not present

## 2017-03-05 DIAGNOSIS — N2581 Secondary hyperparathyroidism of renal origin: Secondary | ICD-10-CM | POA: Diagnosis not present

## 2017-03-06 DIAGNOSIS — L89610 Pressure ulcer of right heel, unstageable: Secondary | ICD-10-CM | POA: Diagnosis not present

## 2017-03-07 DIAGNOSIS — S91309A Unspecified open wound, unspecified foot, initial encounter: Secondary | ICD-10-CM | POA: Diagnosis not present

## 2017-03-07 DIAGNOSIS — N186 End stage renal disease: Secondary | ICD-10-CM | POA: Diagnosis not present

## 2017-03-07 DIAGNOSIS — D631 Anemia in chronic kidney disease: Secondary | ICD-10-CM | POA: Diagnosis not present

## 2017-03-07 DIAGNOSIS — N2581 Secondary hyperparathyroidism of renal origin: Secondary | ICD-10-CM | POA: Diagnosis not present

## 2017-03-07 DIAGNOSIS — E119 Type 2 diabetes mellitus without complications: Secondary | ICD-10-CM | POA: Diagnosis not present

## 2017-03-07 DIAGNOSIS — D509 Iron deficiency anemia, unspecified: Secondary | ICD-10-CM | POA: Diagnosis not present

## 2017-03-08 ENCOUNTER — Ambulatory Visit (INDEPENDENT_AMBULATORY_CARE_PROVIDER_SITE_OTHER): Payer: Medicare Other | Admitting: Family

## 2017-03-08 ENCOUNTER — Encounter (INDEPENDENT_AMBULATORY_CARE_PROVIDER_SITE_OTHER): Payer: Self-pay | Admitting: Family

## 2017-03-08 VITALS — Ht 64.0 in | Wt 239.0 lb

## 2017-03-08 DIAGNOSIS — N8502 Endometrial intraepithelial neoplasia [EIN]: Secondary | ICD-10-CM | POA: Diagnosis not present

## 2017-03-08 DIAGNOSIS — N186 End stage renal disease: Secondary | ICD-10-CM

## 2017-03-08 DIAGNOSIS — N950 Postmenopausal bleeding: Secondary | ICD-10-CM | POA: Diagnosis not present

## 2017-03-08 DIAGNOSIS — Z7409 Other reduced mobility: Secondary | ICD-10-CM | POA: Diagnosis not present

## 2017-03-08 DIAGNOSIS — Z794 Long term (current) use of insulin: Secondary | ICD-10-CM

## 2017-03-08 DIAGNOSIS — N184 Chronic kidney disease, stage 4 (severe): Secondary | ICD-10-CM | POA: Diagnosis not present

## 2017-03-08 DIAGNOSIS — Z89512 Acquired absence of left leg below knee: Secondary | ICD-10-CM | POA: Diagnosis not present

## 2017-03-08 DIAGNOSIS — E119 Type 2 diabetes mellitus without complications: Secondary | ICD-10-CM

## 2017-03-08 DIAGNOSIS — Z30431 Encounter for routine checking of intrauterine contraceptive device: Secondary | ICD-10-CM | POA: Diagnosis not present

## 2017-03-08 MED ORDER — SILVER SULFADIAZINE 1 % EX CREA: 1.0000 "application " | TOPICAL_CREAM | Freq: Every day | CUTANEOUS | 0 refills | Status: DC

## 2017-03-08 NOTE — Telephone Encounter (Signed)
error 

## 2017-03-08 NOTE — Progress Notes (Signed)
Office Visit Note   Patient: Kimberly [name]           Date of Birth: November 29, 1955           MRN: [id number] Visit Date: 03/08/2017              Requested by: Merlene Laughter, MD [address] [place], [place] [address] PCP: Merlene Laughter, MD  Chief Complaint  Patient presents with  . Left Leg - Routine Post Op    02/16/17 left BKA with prevena       HPI: Patient is a 61 year old woman status post transtibial amputation the left, about 3 weeks out. See is seen today as a work in for a concern of dehiscence and pressure injury. Her LPN accompanies the visit.   she has diabetic insensate neuropathy, end-stage renal disease on dialysis on nasal cannula.  Assessment & Plan: Visit Diagnoses:  1. Hx of BKA, left (HCC)   2. Severe obesity (HCC)   3. ESRD (end stage renal disease) (HCC)   4. Diabetes mellitus, type II, insulin dependent (HCC)     Plan: Encouraged to wear the shrinker daily. Continue daily wound cleansing. Apply silvadene to ischemic areas. Pack medial ulcer open with gauze. Evaluate for removal of the sutures and staples at follow-up in 2 weeks.  Follow-Up Instructions: Return in about 2 weeks (around 03/22/2017).   Ortho Exam  Patient is alert, oriented, no adenopathy, well-dressed, normal affect, normal respiratory effort. Examination patient has some mild ischemic changes along the surgical incision. Medially has an open area 15 mm in diameter, this is 10 mm deep with serosanguinous drainage. Laterally has increased ischemic tissue. This is not gaped. Is 3 cm in diameter. Bloody drainage from lateral incision. There is no redness no cellulitis no odor, no signs of infection.   Imaging: No results found.  Labs: Lab Results  Component Value Date   HGBA1C 6.6 10/06/2016   HGBA1C 9.9 (H) 08/23/2016   HGBA1C 8.7 (H) 04/15/2015   ESRSEDRATE 57 (H) 07/12/2015   ESRSEDRATE 110 (H) 04/15/2015   CRP 6.0 (H) 07/12/2015   CRP 3.8 (H) 04/15/2015   REPTSTATUS 02/19/2017 FINAL 02/14/2017   GRAMSTAIN  06/22/2014    MODERATE WBC PRESENT,BOTH PMN AND MONONUCLEAR RARE SQUAMOUS EPITHELIAL CELLS PRESENT NO ORGANISMS SEEN Performed at [facility]   CULT NO GROWTH 5 DAYS 02/14/2017   LABORGA CITROBACTER KOSERI 02/14/2017    Orders:  No orders of the defined types were placed in this encounter.  Meds ordered this encounter  Medications  . DISCONTD: silver sulfADIAZINE (SILVADENE) 1 % cream    Sig: Apply 1 application topically daily.    Dispense:  50 g    Refill:  0  . silver sulfADIAZINE (SILVADENE) 1 % cream    Sig: Apply 1 application topically daily.    Dispense:  50 g    Refill:  0     Procedures: No procedures performed  Clinical Data: No additional findings.  ROS:  All other systems negative, except as noted in the HPI. Review of Systems  Constitutional: Negative for chills and fever.    Objective: Vital Signs: Ht 5\' 4"  (1.626 m)   Wt 239 lb (108.4 kg)   BMI 41.02 kg/m   Specialty Comments:  No specialty comments available.  PMFS History: Patient Active Problem List   Diagnosis Date Noted  . Hx of BKA, left (HCC) 02/27/2017  . Gangrene of left foot (HCC)   . Congestive  heart failure (CHF) (HCC)   . Diabetes mellitus with complication (HCC)   . Sepsis (HCC) 02/14/2017  . Diabetes mellitus, type II, insulin dependent (HCC)   . Pressure injury of skin 08/29/2016  . Chest pain 08/28/2016  . Elevated troponin 08/28/2016  . 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 Hyperkalemia 08/23/2016  . Shortness of breath 08/23/2016  . Hyponatremia 08/23/2016  . Hypotension arterial 09/30/2015  . LBBB (left bundle branch block) 09/30/2015  . Complex endometrial hyperplasia with atypia 09/02/2015  . Severe obesity (HCC) 07/29/2015  . Hypotension 07/20/2015  . Uncontrolled type 2 diabetes mellitus (HCC) 07/20/2015  . Vaginal bleeding   . Altered mental status   . Pulmonary arterial hypertension (HCC)   . Paroxysmal  atrial fibrillation (HCC) 07/11/2015  . ESRD (end stage renal disease) (HCC) 07/08/2015  . Pressure ulcer 04/07/2015  . Obesity hypoventilation syndrome (HCC) 08/05/2014  . Morbid obesity (HCC) 06/28/2014  . Physical deconditioning 06/28/2014  . Hypothyroidism 06/10/2014  . Carotid stenosis 12/02/2013  . Varicose veins of lower extremities with other complications 12/02/2013  . Chronic respiratory failure with hypoxia (HCC) 10/13/2013  . On home oxygen therapy 10/13/2013  . Chronic diastolic CHF (congestive heart failure) (HCC) 09/12/2013  . CAD (coronary artery disease), native coronary artery   . DM (diabetes mellitus), type 2 with renal complications (HCC) 05/31/2010  . Hyperlipidemia associated with type 2 diabetes mellitus (HCC) 05/31/2010  . COPD (chronic obstructive pulmonary disease) (HCC) 05/31/2010  . Hypertensive heart disease    Past Medical History:  Diagnosis Date  . Anemia   . Anxiety   . Arthritis    "knees, feet, hands" (08/23/2016)  . Asthma   . CAD, NATIVE VESSEL    May 10, 2010 cath showed a hyperdynamic LV function, she had dominant circumflex anatomy with a 70-80% small OM1. She had diffuse diabetic plaque particularly in the distal LAD. She nondominant RCA.  Nondominant  . Cellulitis 10/15/2013  . CHF (congestive heart failure) (HCC)    Preserved EF  . Chronic bronchitis (HCC)    "probably once/ yr" (08/23/2016)  . Complication of anesthesia    "I've had difficulty waking up" (08/23/2016)  . COPD   . Depression   . Diabetic neuropathy (HCC)   . Dyspnea   . Endometrial hyperplasia   . ESRD (end stage renal disease) on dialysis (HCC)    "[facility]; MWF; [facility]" (08/23/2016)  . Family history of adverse reaction to anesthesia    mother had hard time waking up  . GERD (gastroesophageal reflux disease)   . History of hiatal hernia   . HYPERLIPIDEMIA   . HYPERTENSION   . Hypothyroidism   . Myocardial infarct, old   . On home oxygen therapy     "2L; 24/7" (08/23/2016)  . Paralyzed vocal cords   . Peripheral neuropathy    hx/notes 01/27/2010  . Pneumonia "several times"  . Proliferative retinopathy    hx/notes 01/27/2010  . PVD    CEA  . Restless leg syndrome    "mostly on the right" (08/23/2016)  . 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 Type II diabetes mellitus (HCC)     Family History  Problem Relation Age of Onset  . Cancer Mother        Breast, NHL  . Stroke Mother   . Peripheral vascular disease Father   . CAD Father  30  . Heart attack Father   . Hypertension Father   . Asthma Father   . Heart disease Father        before age 75    Past Surgical History:  Procedure Laterality Date  . AMPUTATION Left 02/16/2017   Procedure: Left Below Knee Amputation;  Surgeon: Nadara Mustard, MD;  Location: [facility] OR;  Service: Orthopedics;  Laterality: Left;  . AV FISTULA PLACEMENT Right 07/08/2015   Procedure: EXPLORATION RIGHT AXILLARY ARTERY AND RIGHT BRACHIAL VEIN;  Surgeon: Chuck Hint, MD;  Location: [facility] OR;  Service: Vascular;  Laterality: Right;  . AV FISTULA PLACEMENT Right 01/02/2017   Procedure: INSERTION OF ARTERIOVENOUS GORE-TEX  GRAFT  RIGHT ARM;  Surgeon: Chuck Hint, MD;  Location: [facility] OR;  Service: Vascular;  Laterality: Right;  . CAROTID ENDARTERECTOMY Left X 2  . CATARACT EXTRACTION W/ INTRAOCULAR LENS  IMPLANT, BILATERAL Bilateral   . CERVICAL BIOPSY  ~ 2015   "precancerous cells"  . COLONOSCOPY    . EYE SURGERY Bilateral    numerous surgeries  . INSERTION OF DIALYSIS CATHETER N/A 06/24/2015   Procedure: ULTRASOUND BILATERAL INTERNAL JUGULAR VEIN INSERTION OF DIALYSIS CATHETER LEFT INTERNAL JUGULAR VEIN ;  Surgeon: Pryor Ochoa, MD;  Location: [facility] OR;  Service: Vascular;  Laterality: N/A;  . INTRAUTERINE DEVICE INSERTION  ~ 2015  . IR REMOVAL TUN CV CATH W/O FL  02/15/2017  .  VITRECTOMY Bilateral    Social History   Occupational History  . disabled    Social History Main Topics  . Smoking status: Former Smoker    Packs/day: 1.00    Years: 20.00    Types: Cigarettes    Quit date: 09/11/2005  . Smokeless tobacco: Never Used  . Alcohol use No  . Drug use: No  . Sexual activity: No

## 2017-03-09 DIAGNOSIS — E119 Type 2 diabetes mellitus without complications: Secondary | ICD-10-CM | POA: Diagnosis not present

## 2017-03-09 DIAGNOSIS — S91309A Unspecified open wound, unspecified foot, initial encounter: Secondary | ICD-10-CM | POA: Diagnosis not present

## 2017-03-09 DIAGNOSIS — N2581 Secondary hyperparathyroidism of renal origin: Secondary | ICD-10-CM | POA: Diagnosis not present

## 2017-03-09 DIAGNOSIS — D631 Anemia in chronic kidney disease: Secondary | ICD-10-CM | POA: Diagnosis not present

## 2017-03-09 DIAGNOSIS — N186 End stage renal disease: Secondary | ICD-10-CM | POA: Diagnosis not present

## 2017-03-09 DIAGNOSIS — D509 Iron deficiency anemia, unspecified: Secondary | ICD-10-CM | POA: Diagnosis not present

## 2017-03-10 DIAGNOSIS — Z992 Dependence on renal dialysis: Secondary | ICD-10-CM | POA: Diagnosis not present

## 2017-03-10 DIAGNOSIS — N186 End stage renal disease: Secondary | ICD-10-CM | POA: Diagnosis not present

## 2017-03-10 DIAGNOSIS — I129 Hypertensive chronic kidney disease with stage 1 through stage 4 chronic kidney disease, or unspecified chronic kidney disease: Secondary | ICD-10-CM | POA: Diagnosis not present

## 2017-03-12 DIAGNOSIS — D631 Anemia in chronic kidney disease: Secondary | ICD-10-CM | POA: Diagnosis not present

## 2017-03-12 DIAGNOSIS — S91309A Unspecified open wound, unspecified foot, initial encounter: Secondary | ICD-10-CM | POA: Diagnosis not present

## 2017-03-12 DIAGNOSIS — R2689 Other abnormalities of gait and mobility: Secondary | ICD-10-CM | POA: Diagnosis not present

## 2017-03-12 DIAGNOSIS — S41101A Unspecified open wound of right upper arm, initial encounter: Secondary | ICD-10-CM | POA: Diagnosis not present

## 2017-03-12 DIAGNOSIS — N2581 Secondary hyperparathyroidism of renal origin: Secondary | ICD-10-CM | POA: Diagnosis not present

## 2017-03-12 DIAGNOSIS — N186 End stage renal disease: Secondary | ICD-10-CM | POA: Diagnosis not present

## 2017-03-13 ENCOUNTER — Ambulatory Visit (INDEPENDENT_AMBULATORY_CARE_PROVIDER_SITE_OTHER): Payer: Medicare Other | Admitting: Orthopedic Surgery

## 2017-03-13 DIAGNOSIS — L97823 Non-pressure chronic ulcer of other part of left lower leg with necrosis of muscle: Secondary | ICD-10-CM | POA: Diagnosis not present

## 2017-03-13 DIAGNOSIS — L89610 Pressure ulcer of right heel, unstageable: Secondary | ICD-10-CM | POA: Diagnosis not present

## 2017-03-13 DIAGNOSIS — H906 Mixed conductive and sensorineural hearing loss, bilateral: Secondary | ICD-10-CM | POA: Diagnosis not present

## 2017-03-13 DIAGNOSIS — J310 Chronic rhinitis: Secondary | ICD-10-CM | POA: Diagnosis not present

## 2017-03-13 DIAGNOSIS — H6523 Chronic serous otitis media, bilateral: Secondary | ICD-10-CM | POA: Diagnosis not present

## 2017-03-13 DIAGNOSIS — H9313 Tinnitus, bilateral: Secondary | ICD-10-CM | POA: Diagnosis not present

## 2017-03-14 ENCOUNTER — Encounter (HOSPITAL_COMMUNITY): Payer: Self-pay

## 2017-03-14 ENCOUNTER — Emergency Department (HOSPITAL_COMMUNITY): Payer: Medicare Other

## 2017-03-14 ENCOUNTER — Inpatient Hospital Stay (HOSPITAL_COMMUNITY)
Admission: EM | Admit: 2017-03-14 | Discharge: 2017-03-23 | DRG: 981 | Disposition: A | Discharge status: Skilled Nursing Facility | Payer: Medicare Other | Attending: Internal Medicine | Admitting: Internal Medicine

## 2017-03-14 DIAGNOSIS — I110 Hypertensive heart disease with heart failure: Secondary | ICD-10-CM | POA: Diagnosis not present

## 2017-03-14 DIAGNOSIS — Z825 Family history of asthma and other chronic lower respiratory diseases: Secondary | ICD-10-CM

## 2017-03-14 DIAGNOSIS — E1122 Type 2 diabetes mellitus with diabetic chronic kidney disease: Secondary | ICD-10-CM | POA: Diagnosis present

## 2017-03-14 DIAGNOSIS — I9589 Other hypotension: Secondary | ICD-10-CM | POA: Diagnosis present

## 2017-03-14 DIAGNOSIS — Y835 Amputation of limb(s) as the cause of abnormal reaction of the patient, or of later complication, without mention of misadventure at the time of the procedure: Secondary | ICD-10-CM | POA: Diagnosis present

## 2017-03-14 DIAGNOSIS — D631 Anemia in chronic kidney disease: Secondary | ICD-10-CM | POA: Diagnosis not present

## 2017-03-14 DIAGNOSIS — Z66 Do not resuscitate: Secondary | ICD-10-CM | POA: Diagnosis present

## 2017-03-14 DIAGNOSIS — J3800 Paralysis of vocal cords and larynx, unspecified: Secondary | ICD-10-CM | POA: Diagnosis present

## 2017-03-14 DIAGNOSIS — I447 Left bundle-branch block, unspecified: Secondary | ICD-10-CM | POA: Diagnosis present

## 2017-03-14 DIAGNOSIS — I2510 Atherosclerotic heart disease of native coronary artery without angina pectoris: Secondary | ICD-10-CM | POA: Diagnosis not present

## 2017-03-14 DIAGNOSIS — R52 Pain, unspecified: Secondary | ICD-10-CM | POA: Diagnosis not present

## 2017-03-14 DIAGNOSIS — E11621 Type 2 diabetes mellitus with foot ulcer: Secondary | ICD-10-CM | POA: Diagnosis present

## 2017-03-14 DIAGNOSIS — K640 First degree hemorrhoids: Secondary | ICD-10-CM | POA: Diagnosis not present

## 2017-03-14 DIAGNOSIS — Z87891 Personal history of nicotine dependence: Secondary | ICD-10-CM

## 2017-03-14 DIAGNOSIS — I509 Heart failure, unspecified: Secondary | ICD-10-CM | POA: Diagnosis not present

## 2017-03-14 DIAGNOSIS — R748 Abnormal levels of other serum enzymes: Secondary | ICD-10-CM | POA: Diagnosis present

## 2017-03-14 DIAGNOSIS — I120 Hypertensive chronic kidney disease with stage 5 chronic kidney disease or end stage renal disease: Secondary | ICD-10-CM | POA: Diagnosis not present

## 2017-03-14 DIAGNOSIS — T82868A Thrombosis of vascular prosthetic devices, implants and grafts, initial encounter: Secondary | ICD-10-CM | POA: Diagnosis not present

## 2017-03-14 DIAGNOSIS — Z794 Long term (current) use of insulin: Secondary | ICD-10-CM

## 2017-03-14 DIAGNOSIS — E118 Type 2 diabetes mellitus with unspecified complications: Secondary | ICD-10-CM | POA: Diagnosis not present

## 2017-03-14 DIAGNOSIS — N186 End stage renal disease: Secondary | ICD-10-CM | POA: Diagnosis not present

## 2017-03-14 DIAGNOSIS — Y95 Nosocomial condition: Secondary | ICD-10-CM | POA: Diagnosis present

## 2017-03-14 DIAGNOSIS — Z992 Dependence on renal dialysis: Secondary | ICD-10-CM

## 2017-03-14 DIAGNOSIS — Z881 Allergy status to other antibiotic agents status: Secondary | ICD-10-CM

## 2017-03-14 DIAGNOSIS — T829XXD Unspecified complication of cardiac and vascular prosthetic device, implant and graft, subsequent encounter: Secondary | ICD-10-CM | POA: Diagnosis not present

## 2017-03-14 DIAGNOSIS — E1142 Type 2 diabetes mellitus with diabetic polyneuropathy: Secondary | ICD-10-CM | POA: Diagnosis present

## 2017-03-14 DIAGNOSIS — R7881 Bacteremia: Secondary | ICD-10-CM | POA: Diagnosis present

## 2017-03-14 DIAGNOSIS — E871 Hypo-osmolality and hyponatremia: Secondary | ICD-10-CM | POA: Diagnosis present

## 2017-03-14 DIAGNOSIS — Y832 Surgical operation with anastomosis, bypass or graft as the cause of abnormal reaction of the patient, or of later complication, without mention of misadventure at the time of the procedure: Secondary | ICD-10-CM | POA: Diagnosis not present

## 2017-03-14 DIAGNOSIS — G2581 Restless legs syndrome: Secondary | ICD-10-CM | POA: Diagnosis present

## 2017-03-14 DIAGNOSIS — F410 Panic disorder [episodic paroxysmal anxiety] without agoraphobia: Secondary | ICD-10-CM | POA: Diagnosis present

## 2017-03-14 DIAGNOSIS — I480 Paroxysmal atrial fibrillation: Secondary | ICD-10-CM | POA: Diagnosis not present

## 2017-03-14 DIAGNOSIS — T8781 Dehiscence of amputation stump: Secondary | ICD-10-CM | POA: Diagnosis present

## 2017-03-14 DIAGNOSIS — Z9981 Dependence on supplemental oxygen: Secondary | ICD-10-CM

## 2017-03-14 DIAGNOSIS — R1310 Dysphagia, unspecified: Secondary | ICD-10-CM | POA: Diagnosis present

## 2017-03-14 DIAGNOSIS — E1165 Type 2 diabetes mellitus with hyperglycemia: Secondary | ICD-10-CM | POA: Diagnosis present

## 2017-03-14 DIAGNOSIS — E113599 Type 2 diabetes mellitus with proliferative diabetic retinopathy without macular edema, unspecified eye: Secondary | ICD-10-CM | POA: Diagnosis present

## 2017-03-14 DIAGNOSIS — Z89512 Acquired absence of left leg below knee: Secondary | ICD-10-CM

## 2017-03-14 DIAGNOSIS — J440 Chronic obstructive pulmonary disease with acute lower respiratory infection: Secondary | ICD-10-CM | POA: Diagnosis present

## 2017-03-14 DIAGNOSIS — I5032 Chronic diastolic (congestive) heart failure: Secondary | ICD-10-CM | POA: Diagnosis present

## 2017-03-14 DIAGNOSIS — Z8673 Personal history of transient ischemic attack (TIA), and cerebral infarction without residual deficits: Secondary | ICD-10-CM

## 2017-03-14 DIAGNOSIS — E039 Hypothyroidism, unspecified: Secondary | ICD-10-CM | POA: Diagnosis not present

## 2017-03-14 DIAGNOSIS — J9600 Acute respiratory failure, unspecified whether with hypoxia or hypercapnia: Secondary | ICD-10-CM | POA: Diagnosis not present

## 2017-03-14 DIAGNOSIS — I519 Heart disease, unspecified: Secondary | ICD-10-CM | POA: Diagnosis not present

## 2017-03-14 DIAGNOSIS — E662 Morbid (severe) obesity with alveolar hypoventilation: Secondary | ICD-10-CM | POA: Diagnosis not present

## 2017-03-14 DIAGNOSIS — F329 Major depressive disorder, single episode, unspecified: Secondary | ICD-10-CM | POA: Diagnosis present

## 2017-03-14 DIAGNOSIS — J9611 Chronic respiratory failure with hypoxia: Secondary | ICD-10-CM | POA: Diagnosis present

## 2017-03-14 DIAGNOSIS — I252 Old myocardial infarction: Secondary | ICD-10-CM

## 2017-03-14 DIAGNOSIS — I5040 Unspecified combined systolic (congestive) and diastolic (congestive) heart failure: Secondary | ICD-10-CM | POA: Diagnosis not present

## 2017-03-14 DIAGNOSIS — I132 Hypertensive heart and chronic kidney disease with heart failure and with stage 5 chronic kidney disease, or end stage renal disease: Secondary | ICD-10-CM | POA: Diagnosis present

## 2017-03-14 DIAGNOSIS — E785 Hyperlipidemia, unspecified: Secondary | ICD-10-CM | POA: Diagnosis present

## 2017-03-14 DIAGNOSIS — E1152 Type 2 diabetes mellitus with diabetic peripheral angiopathy with gangrene: Secondary | ICD-10-CM | POA: Diagnosis present

## 2017-03-14 DIAGNOSIS — R627 Adult failure to thrive: Secondary | ICD-10-CM | POA: Diagnosis present

## 2017-03-14 DIAGNOSIS — S41101A Unspecified open wound of right upper arm, initial encounter: Secondary | ICD-10-CM | POA: Diagnosis not present

## 2017-03-14 DIAGNOSIS — A419 Sepsis, unspecified organism: Secondary | ICD-10-CM | POA: Diagnosis not present

## 2017-03-14 DIAGNOSIS — Z9841 Cataract extraction status, right eye: Secondary | ICD-10-CM

## 2017-03-14 DIAGNOSIS — L03119 Cellulitis of unspecified part of limb: Secondary | ICD-10-CM | POA: Diagnosis not present

## 2017-03-14 DIAGNOSIS — J410 Simple chronic bronchitis: Secondary | ICD-10-CM | POA: Diagnosis not present

## 2017-03-14 DIAGNOSIS — E1129 Type 2 diabetes mellitus with other diabetic kidney complication: Secondary | ICD-10-CM | POA: Diagnosis not present

## 2017-03-14 DIAGNOSIS — E1140 Type 2 diabetes mellitus with diabetic neuropathy, unspecified: Secondary | ICD-10-CM | POA: Diagnosis present

## 2017-03-14 DIAGNOSIS — Z79899 Other long term (current) drug therapy: Secondary | ICD-10-CM

## 2017-03-14 DIAGNOSIS — J189 Pneumonia, unspecified organism: Secondary | ICD-10-CM | POA: Diagnosis not present

## 2017-03-14 DIAGNOSIS — R0989 Other specified symptoms and signs involving the circulatory and respiratory systems: Secondary | ICD-10-CM | POA: Diagnosis not present

## 2017-03-14 DIAGNOSIS — J441 Chronic obstructive pulmonary disease with (acute) exacerbation: Secondary | ICD-10-CM | POA: Diagnosis not present

## 2017-03-14 DIAGNOSIS — Z961 Presence of intraocular lens: Secondary | ICD-10-CM | POA: Diagnosis present

## 2017-03-14 DIAGNOSIS — H9193 Unspecified hearing loss, bilateral: Secondary | ICD-10-CM | POA: Diagnosis present

## 2017-03-14 DIAGNOSIS — B957 Other staphylococcus as the cause of diseases classified elsewhere: Secondary | ICD-10-CM | POA: Diagnosis present

## 2017-03-14 DIAGNOSIS — R69 Illness, unspecified: Secondary | ICD-10-CM | POA: Diagnosis not present

## 2017-03-14 DIAGNOSIS — J449 Chronic obstructive pulmonary disease, unspecified: Secondary | ICD-10-CM | POA: Diagnosis not present

## 2017-03-14 DIAGNOSIS — E8889 Other specified metabolic disorders: Secondary | ICD-10-CM | POA: Diagnosis present

## 2017-03-14 DIAGNOSIS — Z22322 Carrier or suspected carrier of Methicillin resistant Staphylococcus aureus: Secondary | ICD-10-CM | POA: Diagnosis not present

## 2017-03-14 DIAGNOSIS — L03116 Cellulitis of left lower limb: Secondary | ICD-10-CM | POA: Diagnosis not present

## 2017-03-14 DIAGNOSIS — N2581 Secondary hyperparathyroidism of renal origin: Secondary | ICD-10-CM | POA: Diagnosis present

## 2017-03-14 DIAGNOSIS — Z823 Family history of stroke: Secondary | ICD-10-CM

## 2017-03-14 DIAGNOSIS — R0902 Hypoxemia: Secondary | ICD-10-CM | POA: Diagnosis not present

## 2017-03-14 DIAGNOSIS — Z888 Allergy status to other drugs, medicaments and biological substances status: Secondary | ICD-10-CM

## 2017-03-14 DIAGNOSIS — J383 Other diseases of vocal cords: Secondary | ICD-10-CM | POA: Diagnosis present

## 2017-03-14 DIAGNOSIS — T82868D Thrombosis of vascular prosthetic devices, implants and grafts, subsequent encounter: Secondary | ICD-10-CM | POA: Diagnosis not present

## 2017-03-14 DIAGNOSIS — L97523 Non-pressure chronic ulcer of other part of left foot with necrosis of muscle: Secondary | ICD-10-CM | POA: Diagnosis not present

## 2017-03-14 DIAGNOSIS — T829XXA Unspecified complication of cardiac and vascular prosthetic device, implant and graft, initial encounter: Secondary | ICD-10-CM

## 2017-03-14 DIAGNOSIS — T8249XA Other complication of vascular dialysis catheter, initial encounter: Secondary | ICD-10-CM | POA: Diagnosis not present

## 2017-03-14 DIAGNOSIS — R079 Chest pain, unspecified: Secondary | ICD-10-CM | POA: Diagnosis not present

## 2017-03-14 DIAGNOSIS — R0602 Shortness of breath: Secondary | ICD-10-CM | POA: Diagnosis not present

## 2017-03-14 DIAGNOSIS — M79652 Pain in left thigh: Secondary | ICD-10-CM | POA: Diagnosis not present

## 2017-03-14 DIAGNOSIS — R0603 Acute respiratory distress: Secondary | ICD-10-CM

## 2017-03-14 DIAGNOSIS — Z8249 Family history of ischemic heart disease and other diseases of the circulatory system: Secondary | ICD-10-CM

## 2017-03-14 DIAGNOSIS — Z7982 Long term (current) use of aspirin: Secondary | ICD-10-CM

## 2017-03-14 DIAGNOSIS — L89612 Pressure ulcer of right heel, stage 2: Secondary | ICD-10-CM | POA: Diagnosis present

## 2017-03-14 DIAGNOSIS — Z9842 Cataract extraction status, left eye: Secondary | ICD-10-CM

## 2017-03-14 DIAGNOSIS — J690 Pneumonitis due to inhalation of food and vomit: Secondary | ICD-10-CM

## 2017-03-14 DIAGNOSIS — E6609 Other obesity due to excess calories: Secondary | ICD-10-CM | POA: Diagnosis not present

## 2017-03-14 DIAGNOSIS — K219 Gastro-esophageal reflux disease without esophagitis: Secondary | ICD-10-CM | POA: Diagnosis present

## 2017-03-14 DIAGNOSIS — D72829 Elevated white blood cell count, unspecified: Secondary | ICD-10-CM | POA: Diagnosis present

## 2017-03-14 DIAGNOSIS — S91309A Unspecified open wound, unspecified foot, initial encounter: Secondary | ICD-10-CM | POA: Diagnosis not present

## 2017-03-14 DIAGNOSIS — R05 Cough: Secondary | ICD-10-CM | POA: Diagnosis not present

## 2017-03-14 DIAGNOSIS — M898X9 Other specified disorders of bone, unspecified site: Secondary | ICD-10-CM | POA: Diagnosis present

## 2017-03-14 DIAGNOSIS — T380X5A Adverse effect of glucocorticoids and synthetic analogues, initial encounter: Secondary | ICD-10-CM | POA: Diagnosis present

## 2017-03-14 LAB — CBC WITH DIFFERENTIAL/PLATELET
Basophils Absolute: 0.1 10*3/uL (ref 0.0–0.1)
Basophils Relative: 1 %
EOS PCT: 2 %
Eosinophils Absolute: 0.2 10*3/uL (ref 0.0–0.7)
HCT: 41.5 % (ref 36.0–46.0)
Hemoglobin: 12.8 g/dL (ref 12.0–15.0)
LYMPHS ABS: 1.1 10*3/uL (ref 0.7–4.0)
LYMPHS PCT: 11 %
MCH: 31.6 pg (ref 26.0–34.0)
MCHC: 30.8 g/dL (ref 30.0–36.0)
MCV: 102.5 fL — AB (ref 78.0–100.0)
MONO ABS: 0.7 10*3/uL (ref 0.1–1.0)
MONOS PCT: 7 %
Neutro Abs: 7.9 10*3/uL — ABNORMAL HIGH (ref 1.7–7.7)
Neutrophils Relative %: 79 %
PLATELETS: 249 10*3/uL (ref 150–400)
RBC: 4.05 MIL/uL (ref 3.87–5.11)
RDW: 17.7 % — ABNORMAL HIGH (ref 11.5–15.5)
WBC: 10.1 10*3/uL (ref 4.0–10.5)

## 2017-03-14 LAB — CREATININE, SERUM
Creatinine, Ser: 4.17 mg/dL — ABNORMAL HIGH (ref 0.44–1.00)
GFR calc non Af Amer: 11 mL/min — ABNORMAL LOW (ref >60)
GFR, EST AFRICAN AMERICAN: 12 mL/min — AB (ref >60)

## 2017-03-14 LAB — BASIC METABOLIC PANEL
Anion gap: 12 (ref 5–15)
BUN: 10 mg/dL (ref 6–20)
CO2: 28 mmol/L (ref 22–32)
Calcium: 9.3 mg/dL (ref 8.9–10.3)
Chloride: 96 mmol/L — ABNORMAL LOW (ref 101–111)
Creatinine, Ser: 3.92 mg/dL — ABNORMAL HIGH (ref 0.44–1.00)
GFR calc Af Amer: 13 mL/min — ABNORMAL LOW (ref >60)
GFR, EST NON AFRICAN AMERICAN: 11 mL/min — AB (ref >60)
GLUCOSE: 105 mg/dL — AB (ref 65–99)
POTASSIUM: 3.9 mmol/L (ref 3.5–5.1)
Sodium: 136 mmol/L (ref 135–145)

## 2017-03-14 LAB — CBC
HCT: 34.7 % — ABNORMAL LOW (ref 36.0–46.0)
Hemoglobin: 10.5 g/dL — ABNORMAL LOW (ref 12.0–15.0)
MCH: 31 pg (ref 26.0–34.0)
MCHC: 30.3 g/dL (ref 30.0–36.0)
MCV: 102.4 fL — AB (ref 78.0–100.0)
PLATELETS: 249 10*3/uL (ref 150–400)
RBC: 3.39 MIL/uL — ABNORMAL LOW (ref 3.87–5.11)
RDW: 17.6 % — AB (ref 11.5–15.5)
WBC: 10.3 10*3/uL (ref 4.0–10.5)

## 2017-03-14 LAB — I-STAT TROPONIN, ED: Troponin i, poc: 0.22 ng/mL (ref 0.00–0.08)

## 2017-03-14 LAB — I-STAT CG4 LACTIC ACID, ED: LACTIC ACID, VENOUS: 1.82 mmol/L (ref 0.5–1.9)

## 2017-03-14 LAB — GLUCOSE, CAPILLARY: Glucose-Capillary: 105 mg/dL — ABNORMAL HIGH (ref 65–99)

## 2017-03-14 LAB — TROPONIN I: TROPONIN I: 0.21 ng/mL — AB (ref <0.03)

## 2017-03-14 LAB — CBG MONITORING, ED: GLUCOSE-CAPILLARY: 103 mg/dL — AB (ref 65–99)

## 2017-03-14 MED ORDER — MULTIVITAMIN ADULT PO CHEW: 1.0000 | CHEWABLE_TABLET | Freq: Every day | ORAL | Status: DC

## 2017-03-14 MED ORDER — ALBUTEROL SULFATE (2.5 MG/3ML) 0.083% IN NEBU: 2.5000 mg | INHALATION_SOLUTION | Freq: Four times a day (QID) | RESPIRATORY_TRACT | Status: DC | PRN

## 2017-03-14 MED ORDER — MIDODRINE HCL 5 MG PO TABS
10.0000 mg | ORAL_TABLET | Freq: Every day | ORAL | Status: DC
  Administered 2017-03-15 – 2017-03-23 (×10): 10 mg via ORAL
  Filled 2017-03-14 (×9): qty 2

## 2017-03-14 MED ORDER — ONDANSETRON HCL 4 MG/2ML IJ SOLN: 4.0000 mg | Freq: Four times a day (QID) | INTRAMUSCULAR | Status: DC | PRN

## 2017-03-14 MED ORDER — INSULIN GLARGINE 100 UNIT/ML ~~LOC~~ SOLN
28.0000 [IU] | Freq: Two times a day (BID) | SUBCUTANEOUS | Status: DC
  Administered 2017-03-14 – 2017-03-19 (×9): 28 [IU] via SUBCUTANEOUS
  Filled 2017-03-14 (×12): qty 0.28

## 2017-03-14 MED ORDER — ONDANSETRON HCL 4 MG PO TABS: 4.0000 mg | ORAL_TABLET | Freq: Four times a day (QID) | ORAL | Status: DC | PRN

## 2017-03-14 MED ORDER — INSULIN ASPART 100 UNIT/ML ~~LOC~~ SOLN
0.0000 [IU] | Freq: Three times a day (TID) | SUBCUTANEOUS | Status: DC
  Administered 2017-03-15: 3 [IU] via SUBCUTANEOUS
  Administered 2017-03-16: 2 [IU] via SUBCUTANEOUS
  Administered 2017-03-16: 1 [IU] via SUBCUTANEOUS
  Administered 2017-03-17 – 2017-03-18 (×2): 2 [IU] via SUBCUTANEOUS
  Administered 2017-03-18: 7 [IU] via SUBCUTANEOUS

## 2017-03-14 MED ORDER — ASPIRIN EC 81 MG PO TBEC
81.0000 mg | DELAYED_RELEASE_TABLET | Freq: Every day | ORAL | Status: DC
Start: 2017-03-15 — End: 2017-03-23
  Administered 2017-03-15 – 2017-03-23 (×8): 81 mg via ORAL
  Filled 2017-03-14 (×10): qty 1

## 2017-03-14 MED ORDER — DEXTROSE 5 % IV SOLN
2.0000 g | Freq: Once | INTRAVENOUS | Status: AC
  Administered 2017-03-14: 2 g via INTRAVENOUS
  Filled 2017-03-14: qty 2

## 2017-03-14 MED ORDER — HEPARIN SODIUM (PORCINE) 5000 UNIT/ML IJ SOLN
5000.0000 [IU] | Freq: Three times a day (TID) | INTRAMUSCULAR | Status: DC
  Administered 2017-03-14 – 2017-03-23 (×18): 5000 [IU] via SUBCUTANEOUS
  Filled 2017-03-14 (×19): qty 1

## 2017-03-14 MED ORDER — ASPIRIN 81 MG PO CHEW
324.0000 mg | CHEWABLE_TABLET | Freq: Once | ORAL | Status: AC
  Administered 2017-03-14: 324 mg via ORAL
  Filled 2017-03-14: qty 4

## 2017-03-14 MED ORDER — ACETAMINOPHEN 325 MG PO TABS
650.0000 mg | ORAL_TABLET | Freq: Four times a day (QID) | ORAL | Status: DC | PRN
  Administered 2017-03-14 – 2017-03-15 (×2): 650 mg via ORAL
  Filled 2017-03-14 (×2): qty 2

## 2017-03-14 MED ORDER — CEFEPIME HCL 1 G IJ SOLR
1.0000 g | INTRAMUSCULAR | Status: DC
  Administered 2017-03-15 – 2017-03-18 (×4): 1 g via INTRAVENOUS
  Filled 2017-03-14 (×5): qty 1

## 2017-03-14 MED ORDER — SENNA 8.6 MG PO TABS
2.0000 | ORAL_TABLET | Freq: Every evening | ORAL | Status: DC | PRN
  Administered 2017-03-20 – 2017-03-22 (×2): 17.2 mg via ORAL
  Filled 2017-03-14 (×2): qty 2

## 2017-03-14 MED ORDER — SILVER SULFADIAZINE 1 % EX CREA
1.0000 "application " | TOPICAL_CREAM | Freq: Every day | CUTANEOUS | Status: DC
  Administered 2017-03-18 – 2017-03-22 (×3): 1 via TOPICAL
  Filled 2017-03-14: qty 85

## 2017-03-14 MED ORDER — POLYETHYLENE GLYCOL 3350 17 G PO PACK: 17.0000 g | PACK | Freq: Every day | ORAL | Status: DC | PRN

## 2017-03-14 MED ORDER — VANCOMYCIN HCL IN DEXTROSE 1-5 GM/200ML-% IV SOLN
1000.0000 mg | Freq: Once | INTRAVENOUS | Status: AC
  Administered 2017-03-14: 1000 mg via INTRAVENOUS
  Filled 2017-03-14: qty 200

## 2017-03-14 MED ORDER — SEVELAMER CARBONATE 2.4 G PO PACK
2.4000 g | PACK | Freq: Three times a day (TID) | ORAL | Status: DC
  Administered 2017-03-15 – 2017-03-23 (×10): 2.4 g via ORAL
  Filled 2017-03-14 (×13): qty 1

## 2017-03-14 MED ORDER — ADULT MULTIVITAMIN W/MINERALS CH
1.0000 | ORAL_TABLET | Freq: Every day | ORAL | Status: DC
  Administered 2017-03-15 – 2017-03-22 (×4): 1 via ORAL
  Filled 2017-03-14 (×6): qty 1

## 2017-03-14 MED ORDER — ACETAMINOPHEN 650 MG RE SUPP: 650.0000 mg | Freq: Four times a day (QID) | RECTAL | Status: DC | PRN

## 2017-03-14 MED ORDER — LEVOTHYROXINE SODIUM 88 MCG PO TABS
88.0000 ug | ORAL_TABLET | Freq: Every day | ORAL | Status: DC
  Administered 2017-03-15 – 2017-03-23 (×8): 88 ug via ORAL
  Filled 2017-03-14 (×8): qty 1

## 2017-03-14 MED ORDER — IPRATROPIUM-ALBUTEROL 0.5-2.5 (3) MG/3ML IN SOLN
3.0000 mL | Freq: Once | RESPIRATORY_TRACT | Status: AC
  Administered 2017-03-14: 3 mL via RESPIRATORY_TRACT
  Filled 2017-03-14: qty 3

## 2017-03-14 MED ORDER — DOCUSATE SODIUM 100 MG PO CAPS: 100.0000 mg | ORAL_CAPSULE | Freq: Every day | ORAL | Status: DC | PRN

## 2017-03-14 NOTE — ED Triage Notes (Signed)
PER EMS: pt is from Dialysis with c/o chest congestion after receiving 4 hours of dialysis of her usual 4.5hrs dialysis. She states today they withdrew 10 lbs of fluids from her which she states "usually they dont draw anywhere near that much fluid from me." After this is when she began to describe "chest congestion." Usually on 2L O2, EMS increased her up to 3L due to Sats of 91%. Pt is deaf in the left ear. Pt is still accessed from Dialysis. BP-110/60, HR-60. Denies other complaints. Denies pain. A&Ox4.

## 2017-03-14 NOTE — H&P (Signed)
History and Physical    CORDELIA [name] QMV:[id number] DOB: 1956-09-05 DOA: 03/14/2017  PCP: Merlene Laughter, MD  Patient coming from: Home.  Chief Complaint: Chest pain.  HPI: Kimberly [name] is a 61 y.o. female with history of ESRD on hemodialysis on Monday Wednesday Friday started developing sharp shooting chest pain while during dialysis. Pain was nonradiating retrosternal and had some productive cough. Patient states over the last 3 weeks patient has been having some wheezing and coughing. Patient also has been having nasal drainage for which patient was prescribed nasal spray and antibiotics by patient's ENT surgeon. Patient was recently admitted for left foot infection and had undergone transjugular amputation. Patient also has developed new ulcers on the right foot.   ED Course: In the ER patient is found to be wheezing with chest x-ray showing possible infiltrates. Patient also has productive cough. Patient's clinical picture is compatible with pneumonia and started on antibiotics. Patient has not had any chest pain the ER. EKG was showing LBBB and troponin was negative.  Review of Systems: As per HPI, rest all negative.   Past Medical History:  Diagnosis Date  . Anemia   . Anxiety   . Arthritis    "knees, feet, hands" (08/23/2016)  . Asthma   . CAD, NATIVE VESSEL    May 10, 2010 cath showed a hyperdynamic LV function, she had dominant circumflex anatomy with a 70-80% small OM1. She had diffuse diabetic plaque particularly in the distal LAD. She nondominant RCA.  Nondominant  . Cellulitis 10/15/2013  . CHF (congestive heart failure) (HCC)    Preserved EF  . Chronic bronchitis (HCC)    "probably once/ yr" (08/23/2016)  . Complication of anesthesia    "I've had difficulty waking up" (08/23/2016)  . COPD   . Depression   . Diabetic neuropathy (HCC)   . Dyspnea   . Endometrial hyperplasia   . ESRD (end stage renal disease) on dialysis (HCC)    "[facility]; MWF; [facility]"  (08/23/2016)  . Family history of adverse reaction to anesthesia    mother had hard time waking up  . GERD (gastroesophageal reflux disease)   . History of hiatal hernia   . HYPERLIPIDEMIA   . HYPERTENSION   . Hypothyroidism   . Myocardial infarct, old   . On home oxygen therapy    "2L; 24/7" (08/23/2016)  . Paralyzed vocal cords   . Peripheral neuropathy    hx/notes 01/27/2010  . Pneumonia "several times"  . Proliferative retinopathy    hx/notes 01/27/2010  . PVD    CEA  . Restless leg syndrome    "mostly on the right" (08/23/2016)  . 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 Type II diabetes mellitus (HCC)     Past Surgical History:  Procedure Laterality Date  . AMPUTATION Left 02/16/2017   Procedure: Left Below Knee Amputation;  Surgeon: Nadara Mustard, MD;  Location: [facility] OR;  Service: Orthopedics;  Laterality: Left;  . AV FISTULA PLACEMENT Right 07/08/2015   Procedure: EXPLORATION RIGHT AXILLARY ARTERY AND RIGHT BRACHIAL VEIN;  Surgeon: Chuck Hint, MD;  Location: [facility] OR;  Service: Vascular;  Laterality: Right;  . AV FISTULA PLACEMENT Right 01/02/2017   Procedure: INSERTION OF ARTERIOVENOUS GORE-TEX  GRAFT  RIGHT ARM;  Surgeon: Chuck Hint, MD;  Location: [facility] OR;  Service: Vascular;  Laterality: Right;  .  CAROTID ENDARTERECTOMY Left X 2  . CATARACT EXTRACTION W/ INTRAOCULAR LENS  IMPLANT, BILATERAL Bilateral   . CERVICAL BIOPSY  ~ 2015   "precancerous cells"  . COLONOSCOPY    . EYE SURGERY Bilateral    numerous surgeries  . INSERTION OF DIALYSIS CATHETER N/A 06/24/2015   Procedure: ULTRASOUND BILATERAL INTERNAL JUGULAR VEIN INSERTION OF DIALYSIS CATHETER LEFT INTERNAL JUGULAR VEIN ;  Surgeon: Pryor Ochoa, MD;  Location: [facility] OR;  Service: Vascular;  Laterality: N/A;  . INTRAUTERINE DEVICE INSERTION  ~ 2015  . IR REMOVAL TUN CV CATH W/O FL   02/15/2017  . VITRECTOMY Bilateral      reports that she quit smoking about 11 years ago. Her smoking use included Cigarettes. She has a 20.00 pack-year smoking history. She has never used smokeless tobacco. She reports that she does not drink alcohol or use drugs.  Allergies  Allergen Reactions  . Ciprofloxacin Itching  . Epinephrine Palpitations    Family History  Problem Relation Age of Onset  . Cancer Mother        Breast, NHL  . Stroke Mother   . Peripheral vascular disease Father   . CAD Father 48  . Heart attack Father   . Hypertension Father   . Asthma Father   . Heart disease Father        before age 34    Prior to Admission medications   Medication Sig Start Date End Date Taking? Authorizing Provider  acetaminophen (TYLENOL) 325 MG tablet Take 2 tablets (650 mg total) by mouth every 6 (six) hours as needed for mild pain (or Fever >/= 101). 02/20/17   Elgergawy, Leana Roe, MD  albuterol (PROVENTIL HFA;VENTOLIN HFA) 108 (90 Base) MCG/ACT inhaler Inhale 2 puffs into the lungs every 6 (six) hours as needed for wheezing or shortness of breath.    [provider]  Amino Acids-Protein Hydrolys (FEEDING SUPPLEMENT, PRO-STAT SUGAR FREE 64,) LIQD Take 30 mLs by mouth 2 (two) times daily. 02/20/17   Elgergawy, Leana Roe, MD  aspirin EC 81 MG tablet Take 81 mg by mouth daily.    [provider]  docusate sodium (COLACE) 100 MG capsule Take 1 capsule (100 mg total) by mouth daily. Patient taking differently: Take 100 mg by mouth daily as needed for mild constipation.  10/10/16   Ngetich, Dinah C, NP  hydrocerin (EUCERIN) CREA Apply 1 application topically 2 (two) times daily. 07/20/15   Dhungel, Nishant, MD  insulin aspart (NOVOLOG) 100 UNIT/ML injection Inject 0-9 Units into the skin 3 (three) times daily with meals. 02/20/17   Elgergawy, Leana Roe, MD  insulin glargine (LANTUS) 100 UNIT/ML injection Inject 0.28 mLs (28 Units total) into the skin 2 (two) times daily. 02/20/17    Elgergawy, Leana Roe, MD  levothyroxine (SYNTHROID, LEVOTHROID) 88 MCG tablet Take 1 tablet (88 mcg total) by mouth daily before breakfast. 10/10/16   Ngetich, Dinah C, NP  midodrine (PROAMATINE) 10 MG tablet Take 1 tablet (10 mg total) by mouth daily. 02/20/17   Elgergawy, Leana Roe, MD  Multiple Vitamins-Minerals (MULTIVITAMIN ADULT) CHEW Chew 1 tablet by mouth daily.    [provider]  OXYGEN 2lpm 24/7- [facility]    [provider]  polyethylene glycol (MIRALAX / GLYCOLAX) packet Take 17 g by mouth daily as needed for mild constipation. 02/20/17   Elgergawy, Leana Roe, MD  senna (SENOKOT) 8.6 MG tablet Take 2 tablets (17.2 mg total) by mouth at bedtime as needed for constipation. 02/20/17  Elgergawy, Leana Roeawood S, MD  sevelamer carbonate (RENVELA) 2.4 g PACK Take 2.4 g by mouth 3 (three) times daily with meals. 08/31/16   Dorothea OgleMyers, Iskra M, MD  silver sulfADIAZINE (SILVADENE) 1 % cream Apply 1 application topically daily. 03/08/17   Adonis HugueninZamora, Erin R, NP    Physical Exam: Vitals:   03/14/17 1708 03/14/17 1715 03/14/17 1845 03/14/17 1936  BP: (!) 99/52  93/62 (!) 119/50  Pulse: 97 95 92 89  Resp: 18 15 17 16   Temp: 98.4 F (36.9 C)     TempSrc: Oral     SpO2: 100% 100% 100% 100%      Constitutional: Moderately built and nourished. Vitals:   03/14/17 1708 03/14/17 1715 03/14/17 1845 03/14/17 1936  BP: (!) 99/52  93/62 (!) 119/50  Pulse: 97 95 92 89  Resp: 18 15 17 16   Temp: 98.4 F (36.9 C)     TempSrc: Oral     SpO2: 100% 100% 100% 100%   Eyes: Anicteric no pallor. ENMT: No discharge from the ears eyes nose and more. Neck: No mass. No JVD appreciated. Respiratory: Bilateral expiratory wheezes and no palpitations. Cardiovascular: S1-S2 no murmurs appreciated. Abdomen: Soft nontender bowel sounds present. Musculoskeletal: Left lower extremity is in dressing. Skin: Left lower extremity is in dressing. Neurologic: Alert awake oriented to time place and person. Psychiatric:  Appears normal.   Labs on Admission: I have personally reviewed following labs and imaging studies  CBC:  Recent Labs Lab 03/14/17 1750  WBC 10.1  NEUTROABS 7.9*  HGB 12.8  HCT 41.5  MCV 102.5*  PLT 249   Basic Metabolic Panel:  Recent Labs Lab 03/14/17 1750  NA 136  K 3.9  CL 96*  CO2 28  GLUCOSE 105*  BUN 10  CREATININE 3.92*  CALCIUM 9.3   GFR: Estimated Creatinine Clearance: 17.9 mL/min (A) (by C-G formula based on SCr of 3.92 mg/dL (H)). Liver Function Tests: No results for input(s): AST, ALT, ALKPHOS, BILITOT, PROT, ALBUMIN in the last 168 hours. No results for input(s): LIPASE, AMYLASE in the last 168 hours. No results for input(s): AMMONIA in the last 168 hours. Coagulation Profile: No results for input(s): INR, PROTIME in the last 168 hours. Cardiac Enzymes: No results for input(s): CKTOTAL, CKMB, CKMBINDEX, TROPONINI in the last 168 hours. BNP (last 3 results) No results for input(s): PROBNP in the last 8760 hours. HbA1C: No results for input(s): HGBA1C in the last 72 hours. CBG:  Recent Labs Lab 03/14/17 1950  GLUCAP 103*   Lipid Profile: No results for input(s): CHOL, HDL, LDLCALC, TRIG, CHOLHDL, LDLDIRECT in the last 72 hours. Thyroid Function Tests: No results for input(s): TSH, T4TOTAL, FREET4, T3FREE, THYROIDAB in the last 72 hours. Anemia Panel: No results for input(s): VITAMINB12, FOLATE, FERRITIN, TIBC, IRON, RETICCTPCT in the last 72 hours. Urine analysis:    Component Value Date/Time   COLORURINE RED (A) 07/08/2015 1705   APPEARANCEUR TURBID (A) 07/08/2015 1705   LABSPEC 1.023 07/08/2015 1705   PHURINE 5.0 07/08/2015 1705   GLUCOSEU 100 (A) 07/08/2015 1705   HGBUR MODERATE (A) 07/08/2015 1705   BILIRUBINUR MODERATE (A) 07/08/2015 1705   KETONESUR 15 (A) 07/08/2015 1705   PROTEINUR 100 (A) 07/08/2015 1705   UROBILINOGEN 1.0 07/08/2015 1705   NITRITE POSITIVE (A) 07/08/2015 1705   LEUKOCYTESUR SMALL (A) 07/08/2015 1705    Sepsis Labs: @LABRCNTIP (procalcitonin:4,lacticidven:4) )No results found for this or any previous visit (from the past 240 hour(s)).   Radiological Exams on Admission: Dg Chest  2 View  Result Date: 03/14/2017 CLINICAL DATA:  PER EMS: pt is from Dialysis with c/o chest congestion after receiving 4 hours of dialysis of her usual 4.5hrs dialysis. She states today they withdrew 10 lbs of fluids from her which she states "usually they don't draw anywhere near that much fluid. Chest congestion. History of diabetes. EXAM: CHEST  2 VIEW COMPARISON:  02/18/2017 FINDINGS: Patient is slightly rotated towards the right. Heart size is accentuated by the AP position of the patient. Shallow lung inflation. Stable elevation of right hemidiaphragm. Density at the left lung base Raises the question of early left lower lobe infiltrate or could be related to artifact from positioning. IMPRESSION: 1.  Study quality is degraded by patient positioning. 2. Possible left lower lobe infiltrate. Electronically Signed   By: [name] M.D.   On: 03/14/2017 18:46    EKG: Independently reviewed. Sinus rhythm with LBBB.  Assessment/Plan Principal Problem:   HCAP (healthcare-associated pneumonia) Active Problems:   CAD (coronary artery disease), native coronary artery   COPD (chronic obstructive pulmonary disease) (HCC)   Hypothyroidism   Paroxysmal atrial fibrillation (HCC)   Diabetes mellitus with complication (HCC)   Pneumonia    1. Healthcare associated pneumonia - patient is placed on vancomycin and cefepime. Follow cultures. 2. COPD exacerbation - on exam patient has bilateral expiratory wheeze for which I have placed patient on nebulizer and Pulmicort. If continues to wheeze may need IV steroids. 3. Chest pain - appears atypical. Cycle cardiac markers.  4. Left leg wound status post recent transtibial amputation also has a new one on the right leg - wound team consult. 5. Diabetes mellitus type 2 - on  Lantus and sliding scale coverage. 6. Hypothyroidism on Synthroid. 7. Anemia probably from renal disease - follow CBC. 8. History of paroxysmal atrial fibrillation - patient had refused anticoagulation previously. 9. ESRD on hemodialysis - Monday Wednesday and Friday. Consult nephrology for dialysis.   DVT prophylaxis: Heparin.  Code Status: Full code.  Family Communication: Discussed with patient.  Disposition Plan: Home.  Consults called: None.  Admission status: Inpatient.    Eduard Clos MD [facility] Pager [phone].  If 7PM-7AM, please contact night-coverage [email or web address] Password [facility]  03/14/2017, 7:53 PM

## 2017-03-14 NOTE — ED Notes (Signed)
Pt has returned from xray

## 2017-03-14 NOTE — Progress Notes (Signed)
Critical Lab Troponin-0.21 MD notified. No new orders at this time.

## 2017-03-14 NOTE — ED Notes (Signed)
Attempted to call report

## 2017-03-14 NOTE — ED Notes (Addendum)
Attempted several times to collect blood specimen for blood cultures but failed due to minute /poorly visible veins. Dr. Charline BillsSchlolssman notified.

## 2017-03-14 NOTE — Progress Notes (Signed)
Removed HD needles from upper right arm graft without difficulty. Venous needle held approx. 5 minutes and arterial approx. 7 mins. Pressure dressing and paper tape applied. NT at bedside.

## 2017-03-14 NOTE — ED Provider Notes (Signed)
[facility]-EMERGENCY DEPT Provider Note   CSN: [id number] Arrival date & time: 03/14/17  1653     History   Chief Complaint Chief Complaint  Patient presents with  . Chest Congestion after Dialysis    HPI Kimberly [name] is a 61 y.o. female.  HPI Kimberly [name] is a 61 y.o. female with history of anemia, anxiety, CHF, chronic bronchitis on oxygen, coronary artery disease, end-stage renal disease on dialysis, presents to emergency department complaining of cough and shortness of breath. Patient states her symptoms started today while in dialysis. She states she finished 4 hours at a 4-1/2 hour dialysis session when her symptoms began. She states she started feeling burning sensation in left side of the chest. Pain comes and goes since then. She reports that she has had persistent cough for about 2 weeks that is getting worse. She denies any known fever. She states that they took 10 pounds of fluid off of her which is unusually large amount. She reports starting on amoxicillin yesterday for sinusitis. She is also complaining of pain to the left BKA stump. She states she is followed by wound care nurse and currently gets iodine dressings. She states that she has been taking her usual pain medication but has not been helping her pain.  Past Medical History:  Diagnosis Date  . Anemia   . Anxiety   . Arthritis    "knees, feet, hands" (08/23/2016)  . Asthma   . CAD, NATIVE VESSEL    May 10, 2010 cath showed a hyperdynamic LV function, she had dominant circumflex anatomy with a 70-80% small OM1. She had diffuse diabetic plaque particularly in the distal LAD. She nondominant RCA.  Nondominant  . Cellulitis 10/15/2013  . CHF (congestive heart failure) (HCC)    Preserved EF  . Chronic bronchitis (HCC)    "probably once/ yr" (08/23/2016)  . Complication of anesthesia    "I've had difficulty waking up" (08/23/2016)  . COPD   . Depression   . Diabetic neuropathy (HCC)   . Dyspnea   . Endometrial  hyperplasia   . ESRD (end stage renal disease) on dialysis (HCC)    "[facility]; MWF; [facility]" (08/23/2016)  . Family history of adverse reaction to anesthesia    mother had hard time waking up  . GERD (gastroesophageal reflux disease)   . History of hiatal hernia   . HYPERLIPIDEMIA   . HYPERTENSION   . Hypothyroidism   . Myocardial infarct, old   . On home oxygen therapy    "2L; 24/7" (08/23/2016)  . Paralyzed vocal cords   . Peripheral neuropathy    hx/notes 01/27/2010  . Pneumonia "several times"  . Proliferative retinopathy    hx/notes 01/27/2010  . PVD    CEA  . Restless leg syndrome    "mostly on the right" (08/23/2016)  . 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 Type II diabetes mellitus [facility])     Patient Active Problem List   Diagnosis Date Noted  . Hx of BKA, left (HCC) 02/27/2017  . Gangrene of left foot (HCC)   . Congestive heart failure (CHF) (HCC)   . Diabetes mellitus with complication (HCC)   . Sepsis (HCC) 02/14/2017  . Diabetes mellitus, type II, insulin dependent (HCC)   . Pressure injury of skin 08/29/2016  . Chest pain 08/28/2016  . Elevated troponin 08/28/2016  .  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 Hyperkalemia 08/23/2016  . Shortness of breath 08/23/2016  . Hyponatremia 08/23/2016  . Hypotension arterial 09/30/2015  . LBBB (left bundle branch block) 09/30/2015  . Complex endometrial hyperplasia with atypia 09/02/2015  . Severe obesity (HCC) 07/29/2015  . Hypotension 07/20/2015  . Uncontrolled type 2 diabetes mellitus (HCC) 07/20/2015  . Vaginal bleeding   . Altered mental status   . Pulmonary arterial hypertension (HCC)   . Paroxysmal atrial fibrillation (HCC) 07/11/2015  . ESRD (end stage renal disease) (HCC) 07/08/2015  . Pressure ulcer 04/07/2015  . Obesity hypoventilation syndrome (HCC) 08/05/2014  . Morbid obesity (HCC)  06/28/2014  . Physical deconditioning 06/28/2014  . Hypothyroidism 06/10/2014  . Carotid stenosis 12/02/2013  . Varicose veins of lower extremities with other complications 12/02/2013  . Chronic respiratory failure with hypoxia (HCC) 10/13/2013  . On home oxygen therapy 10/13/2013  . Chronic diastolic CHF (congestive heart failure) (HCC) 09/12/2013  . CAD (coronary artery disease), native coronary artery   . DM (diabetes mellitus), type 2 with renal complications (HCC) 05/31/2010  . Hyperlipidemia associated with type 2 diabetes mellitus (HCC) 05/31/2010  . COPD (chronic obstructive pulmonary disease) (HCC) 05/31/2010  . Hypertensive heart disease     Past Surgical History:  Procedure Laterality Date  . AMPUTATION Left 02/16/2017   Procedure: Left Below Knee Amputation;  Surgeon: Nadara Mustarduda, Marcus V, MD;  [facility];  Service: Orthopedics;  Laterality: Left;  . AV FISTULA PLACEMENT Right 07/08/2015   Procedure: EXPLORATION RIGHT AXILLARY ARTERY AND RIGHT BRACHIAL VEIN;  Surgeon: Chuck Hinthristopher S Dickson, MD;  [facility];  Service: Vascular;  Laterality: Right;  . AV FISTULA PLACEMENT Right 01/02/2017   Procedure: INSERTION OF ARTERIOVENOUS GORE-TEX  GRAFT  RIGHT ARM;  Surgeon: Chuck Hinthristopher S Dickson, MD;  [facility];  Service: Vascular;  Laterality: Right;  . CAROTID ENDARTERECTOMY Left X 2  . CATARACT EXTRACTION W/ INTRAOCULAR LENS  IMPLANT, BILATERAL Bilateral   . CERVICAL BIOPSY  ~ 2015   "precancerous cells"  . COLONOSCOPY    . EYE SURGERY Bilateral    numerous surgeries  . INSERTION OF DIALYSIS CATHETER N/A 06/24/2015   Procedure: ULTRASOUND BILATERAL INTERNAL JUGULAR VEIN INSERTION OF DIALYSIS CATHETER LEFT INTERNAL JUGULAR VEIN ;  Surgeon: Pryor OchoaJames D Lawson, MD;  [facility];  Service: Vascular;  Laterality: N/A;  . INTRAUTERINE DEVICE INSERTION  ~ 2015  . IR REMOVAL TUN CV CATH W/O FL  02/15/2017  . VITRECTOMY Bilateral     OB History    No data available        Home Medications    Prior to Admission medications   Medication Sig Start Date End Date Taking? Authorizing Provider  acetaminophen (TYLENOL) 325 MG tablet Take 2 tablets (650 mg total) by mouth every 6 (six) hours as needed for mild pain (or Fever >/= 101). 02/20/17   Elgergawy, Leana Roeawood S, MD  albuterol (PROVENTIL HFA;VENTOLIN HFA) 108 (90 Base) MCG/ACT inhaler Inhale 2 puffs into the lungs every 6 (six) hours as needed for wheezing or shortness of breath.    [provider]  Amino Acids-Protein Hydrolys (FEEDING SUPPLEMENT, PRO-STAT SUGAR FREE 64,) LIQD Take 30 mLs by mouth 2 (two) times daily. 02/20/17   Elgergawy, Leana Roeawood S, MD  aspirin EC 81 MG tablet Take 81 mg by mouth daily.    [provider]  docusate sodium (COLACE) 100 MG capsule Take 1 capsule (100 mg total) by mouth daily. Patient taking differently: Take 100 mg by  mouth daily as needed for mild constipation.  10/10/16   Ngetich, Dinah C, NP  hydrocerin (EUCERIN) CREA Apply 1 application topically 2 (two) times daily. 07/20/15   Dhungel, Nishant, MD  insulin aspart (NOVOLOG) 100 UNIT/ML injection Inject 0-9 Units into the skin 3 (three) times daily with meals. 02/20/17   Elgergawy, Leana Roe, MD  insulin glargine (LANTUS) 100 UNIT/ML injection Inject 0.28 mLs (28 Units total) into the skin 2 (two) times daily. 02/20/17   Elgergawy, Leana Roe, MD  levothyroxine (SYNTHROID, LEVOTHROID) 88 MCG tablet Take 1 tablet (88 mcg total) by mouth daily before breakfast. 10/10/16   Ngetich, Dinah C, NP  midodrine (PROAMATINE) 10 MG tablet Take 1 tablet (10 mg total) by mouth daily. 02/20/17   Elgergawy, Leana Roe, MD  Multiple Vitamins-Minerals (MULTIVITAMIN ADULT) CHEW Chew 1 tablet by mouth daily.    [provider]  OXYGEN 2lpm 24/7- [facility]    [provider]  polyethylene glycol (MIRALAX / GLYCOLAX) packet Take 17 g by mouth daily as needed for mild constipation. 02/20/17   Elgergawy, Leana Roe, MD  senna (SENOKOT)  8.6 MG tablet Take 2 tablets (17.2 mg total) by mouth at bedtime as needed for constipation. 02/20/17   Elgergawy, Leana Roe, MD  sevelamer carbonate (RENVELA) 2.4 g PACK Take 2.4 g by mouth 3 (three) times daily with meals. 08/31/16   Dorothea Ogle, MD  silver sulfADIAZINE (SILVADENE) 1 % cream Apply 1 application topically daily. 03/08/17   Adonis Huguenin, NP    Family History Family History  Problem Relation Age of Onset  . Cancer Mother        Breast, NHL  . Stroke Mother   . Peripheral vascular disease Father   . CAD Father 36  . Heart attack Father   . Hypertension Father   . Asthma Father   . Heart disease Father        before age 52    Social History Social History  Substance Use Topics  . Smoking status: Former Smoker    Packs/day: 1.00    Years: 20.00    Types: Cigarettes    Quit date: 09/11/2005  . Smokeless tobacco: Never Used  . Alcohol use No     Allergies   Ciprofloxacin and Epinephrine   Review of Systems Review of Systems  Constitutional: Negative for chills and fever.  Respiratory: Positive for cough, chest tightness and shortness of breath.   Cardiovascular: Positive for chest pain. Negative for palpitations and leg swelling.  Gastrointestinal: Negative for abdominal pain, diarrhea, nausea and vomiting.  Genitourinary: Negative for dysuria, flank pain, pelvic pain, vaginal bleeding, vaginal discharge and vaginal pain.  Musculoskeletal: Positive for arthralgias and myalgias. Negative for neck pain and neck stiffness.  Skin: Negative for rash.  Neurological: Negative for dizziness, weakness, numbness and headaches.  All other systems reviewed and are negative.    Physical Exam Updated Vital Signs BP (!) 99/52   Pulse 95   Temp 98.4 F (36.9 C) (Oral)   Resp 15   SpO2 100%   Physical Exam  Constitutional: She appears well-developed and well-nourished. No distress.  HENT:  Head: Normocephalic.  Eyes: Conjunctivae are normal.  Neck: Neck  supple.  Cardiovascular: Normal rate, regular rhythm and normal heart sounds.   Pulmonary/Chest: Effort normal. No respiratory distress. She has wheezes. She has rales.  Abdominal: Soft. Bowel sounds are normal. She exhibits no distension. There is no tenderness. There is no rebound.  Musculoskeletal: She exhibits no  edema.  Left BKA, incision with stitches intact, appears to be erythematous, no purulent drainage. Diffuse tenderness.  Neurological: She is alert.  Skin: Skin is warm and dry.  Psychiatric: She has a normal mood and affect. Her behavior is normal.  Nursing note and vitals reviewed.    ED Treatments / Results  Labs (all labs ordered are listed, but only abnormal results are displayed) Labs Reviewed  CBC WITH DIFFERENTIAL/PLATELET  BASIC METABOLIC PANEL  I-STAT TROPOININ, ED    EKG  EKG Interpretation None       Radiology No results found.  Procedures Procedures (including critical care time)  Medications Ordered in ED Medications  ipratropium-albuterol (DUONEB) 0.5-2.5 (3) MG/3ML nebulizer solution 3 mL (not administered)     Initial Impression / Assessment and Plan / ED Course  I have reviewed the triage vital signs and the nursing notes.  Pertinent labs & imaging results that were available during my care of the patient were reviewed by me and considered in my medical decision making (see chart for details).     Patient in emergency department with chest pain while in dialysis and cough. Patient is also complaining of pain to her left BKA. Patient's vital signs are normal at this time. Her lung sounds are positive for wheezes and rales at bases. Will check chest x-ray, labs, EKG, will monitor.   7:18 PM Patient's troponin has bumped at 0.22. Her pain is now gone. She felt slightly improved after a breathing treatment. Her chest x-ray showed possible left lower lobe pneumonia. Will start on antibiotic and admit for chest pain rule out/pneumonia  treatment. Added blood cultures and lactic acid.   Will admit to medicine.      Final Clinical Impressions(s) / ED Diagnoses   Final diagnoses:  Pneumonia    New Prescriptions New Prescriptions   No medications on file     Jaynie Crumble, PA-C 03/16/17 9147    Alvira Monday, MD 03/18/17 2152

## 2017-03-14 NOTE — Progress Notes (Addendum)
Pharmacy Antibiotic Note  Kimberly [name] is a 61 y.o. female admitted on 03/14/2017 with pneumonia. Pharmacy has been consulted for vancomycin dosing for 8 days total. Patient currently receiving cefepime. Patient is ESRD on HD who presented with chest congestion after dialysis. She is also s/p BKA. Afebrile, normal white count, lactate slightly up at 1.82. No cultures yet. CXR with possible LLL pneumonia. Will give load of vancomycin and follow-up further HD plans prior to redosing.   Plan: Vancomycin 2000mg  IV x1 Continue vancomycin therapy for a total of 8 days F/u HD plans, C/S, LOT, ability to de-escalate     Temp (24hrs), Avg:98.4 F (36.9 C), Min:98.4 F (36.9 C), Max:98.4 F (36.9 C)   Recent Labs Lab 03/14/17 1750 03/14/17 1925  WBC 10.1  --   CREATININE 3.92*  --   LATICACIDVEN  --  1.82    Estimated Creatinine Clearance: 17.9 mL/min (A) (by C-G formula based on SCr of 3.92 mg/dL (H)).    Allergies  Allergen Reactions  . Ciprofloxacin Itching  . Epinephrine Palpitations    Antimicrobials this admission: cefepime 7/4 >>  vancomycin 7/4 >>   Dose adjustments this admission: 7/4: Vancomycin 2000mg  x1  Microbiology results: 7/4 BCx: Ordered  Thank you for allowing pharmacy to be a part of this patient's care.  Alfredo BachJoseph Arminger, Cleotis NipperBS, PharmD Clinical Pharmacy Resident [phone] (Pager) 03/14/2017 7:43 PM

## 2017-03-14 NOTE — ED Notes (Signed)
Dr. Dalene SeltzerSchlossman informed of pts Troponin of 0.22. No verbal orders at this time.

## 2017-03-14 NOTE — ED Notes (Signed)
Pt transported to XRAY at this time.

## 2017-03-14 NOTE — Progress Notes (Signed)
New Admission Note:  Arrival Method: Via stretcher from ED. Mental Orientation: Alert & Oriented x4 Telemetry: CCMD verified. Assessment: Completed Skin: Refer to flowsheet IV: Left Hand Pain: 0/10 Tubes: None Safety Measures: Safety Fall Prevention Plan discussed with patient. Admission: Completed [facility] [address] Orientation: Patient has been orientated to the room, unit and the staff.  Orders have been reviewed and implemented. Will continue to monitor the patient. Call light has been placed within reach and bed alarm has been activated.   Aram CandelaJequetta Elnita Surprenant, RN  Phone Number: [phone]

## 2017-03-14 NOTE — ED Notes (Signed)
IV team at bedside awaiting pts return from xray so she can de-access patients fistula.

## 2017-03-14 NOTE — ED Notes (Signed)
Pt usually lives in a nursing home.

## 2017-03-15 DIAGNOSIS — I2510 Atherosclerotic heart disease of native coronary artery without angina pectoris: Secondary | ICD-10-CM

## 2017-03-15 DIAGNOSIS — I480 Paroxysmal atrial fibrillation: Secondary | ICD-10-CM

## 2017-03-15 DIAGNOSIS — J410 Simple chronic bronchitis: Secondary | ICD-10-CM

## 2017-03-15 LAB — GLUCOSE, CAPILLARY
GLUCOSE-CAPILLARY: 117 mg/dL — AB (ref 65–99)
GLUCOSE-CAPILLARY: 206 mg/dL — AB (ref 65–99)
Glucose-Capillary: 45 mg/dL — ABNORMAL LOW (ref 65–99)
Glucose-Capillary: 101 mg/dL — ABNORMAL HIGH (ref 65–99)
Glucose-Capillary: 317 mg/dL — ABNORMAL HIGH (ref 65–99)

## 2017-03-15 LAB — RENAL FUNCTION PANEL
Albumin: 2.3 g/dL — ABNORMAL LOW (ref 3.5–5.0)
Anion gap: 13 (ref 5–15)
BUN: 27 mg/dL — ABNORMAL HIGH (ref 6–20)
CO2: 22 mmol/L (ref 22–32)
Calcium: 9 mg/dL (ref 8.9–10.3)
Chloride: 93 mmol/L — ABNORMAL LOW (ref 101–111)
Creatinine, Ser: 5.55 mg/dL — ABNORMAL HIGH (ref 0.44–1.00)
GFR calc Af Amer: 9 mL/min — ABNORMAL LOW (ref >60)
GFR calc non Af Amer: 7 mL/min — ABNORMAL LOW (ref >60)
Glucose, Bld: 307 mg/dL — ABNORMAL HIGH (ref 65–99)
Phosphorus: 4.6 mg/dL (ref 2.5–4.6)
Potassium: 4.4 mmol/L (ref 3.5–5.1)
Sodium: 128 mmol/L — ABNORMAL LOW (ref 135–145)

## 2017-03-15 LAB — BASIC METABOLIC PANEL
ANION GAP: 11 (ref 5–15)
BUN: 16 mg/dL (ref 6–20)
CO2: 28 mmol/L (ref 22–32)
Calcium: 9.1 mg/dL (ref 8.9–10.3)
Chloride: 98 mmol/L — ABNORMAL LOW (ref 101–111)
Creatinine, Ser: 4.68 mg/dL — ABNORMAL HIGH (ref 0.44–1.00)
GFR calc Af Amer: 11 mL/min — ABNORMAL LOW (ref >60)
GFR calc non Af Amer: 9 mL/min — ABNORMAL LOW (ref >60)
GLUCOSE: 47 mg/dL — AB (ref 65–99)
POTASSIUM: 3.8 mmol/L (ref 3.5–5.1)
Sodium: 137 mmol/L (ref 135–145)

## 2017-03-15 LAB — CBC
HCT: 28.7 % — ABNORMAL LOW (ref 36.0–46.0)
HEMATOCRIT: 35.5 % — AB (ref 36.0–46.0)
HEMOGLOBIN: 10.3 g/dL — AB (ref 12.0–15.0)
Hemoglobin: 8.6 g/dL — ABNORMAL LOW (ref 12.0–15.0)
MCH: 30.2 pg (ref 26.0–34.0)
MCH: 30.5 pg (ref 26.0–34.0)
MCHC: 29 g/dL — AB (ref 30.0–36.0)
MCHC: 30 g/dL (ref 30.0–36.0)
MCV: 104.1 fL — AB (ref 78.0–100.0)
MCV: 101.8 fL — ABNORMAL HIGH (ref 78.0–100.0)
Platelets: 262 10*3/uL (ref 150–400)
Platelets: 132 10*3/uL — ABNORMAL LOW (ref 150–400)
RBC: 3.41 MIL/uL — ABNORMAL LOW (ref 3.87–5.11)
RBC: 2.82 MIL/uL — ABNORMAL LOW (ref 3.87–5.11)
RDW: 18.1 % — AB (ref 11.5–15.5)
RDW: 17.8 % — ABNORMAL HIGH (ref 11.5–15.5)
WBC: 10.2 10*3/uL (ref 4.0–10.5)
WBC: 7.9 10*3/uL (ref 4.0–10.5)

## 2017-03-15 LAB — HIV ANTIBODY (ROUTINE TESTING W REFLEX): HIV Screen 4th Generation wRfx: NONREACTIVE

## 2017-03-15 LAB — TROPONIN I
Troponin I: 0.2 ng/mL (ref <0.03)
Troponin I: 0.24 ng/mL (ref <0.03)

## 2017-03-15 LAB — MAGNESIUM: Magnesium: 1.8 mg/dL (ref 1.7–2.4)

## 2017-03-15 LAB — PHOSPHORUS: PHOSPHORUS: 4.4 mg/dL (ref 2.5–4.6)

## 2017-03-15 LAB — MRSA PCR SCREENING: MRSA BY PCR: POSITIVE — AB

## 2017-03-15 MED ORDER — PENTAFLUOROPROP-TETRAFLUOROETH EX AERO: 1.0000 "application " | INHALATION_SPRAY | CUTANEOUS | Status: DC | PRN

## 2017-03-15 MED ORDER — IPRATROPIUM-ALBUTEROL 0.5-2.5 (3) MG/3ML IN SOLN: 3.0000 mL | RESPIRATORY_TRACT | Status: DC | PRN

## 2017-03-15 MED ORDER — LIDOCAINE 5 % EX PTCH
1.0000 | MEDICATED_PATCH | CUTANEOUS | Status: DC
  Filled 2017-03-15 (×6): qty 1

## 2017-03-15 MED ORDER — LIDOCAINE HCL (PF) 1 % IJ SOLN: 5.0000 mL | INTRAMUSCULAR | Status: DC | PRN

## 2017-03-15 MED ORDER — HEPARIN SODIUM (PORCINE) 1000 UNIT/ML DIALYSIS: 1000.0000 [IU] | INTRAMUSCULAR | Status: DC | PRN

## 2017-03-15 MED ORDER — BUDESONIDE 0.25 MG/2ML IN SUSP
0.2500 mg | Freq: Two times a day (BID) | RESPIRATORY_TRACT | Status: DC
  Administered 2017-03-15 – 2017-03-22 (×13): 0.25 mg via RESPIRATORY_TRACT
  Filled 2017-03-15 (×17): qty 2

## 2017-03-15 MED ORDER — SODIUM CHLORIDE 0.9 % IV SOLN
125.0000 mg | INTRAVENOUS | Status: DC
  Filled 2017-03-15: qty 10

## 2017-03-15 MED ORDER — IPRATROPIUM-ALBUTEROL 0.5-2.5 (3) MG/3ML IN SOLN
3.0000 mL | RESPIRATORY_TRACT | Status: DC
  Administered 2017-03-15: 3 mL via RESPIRATORY_TRACT
  Filled 2017-03-15: qty 3

## 2017-03-15 MED ORDER — VANCOMYCIN HCL IN DEXTROSE 1-5 GM/200ML-% IV SOLN
1000.0000 mg | INTRAVENOUS | Status: DC
  Filled 2017-03-15: qty 200

## 2017-03-15 MED ORDER — SODIUM CHLORIDE 0.9 % IV SOLN
100.0000 mL | INTRAVENOUS | Status: DC | PRN
  Administered 2017-03-17: 08:00:00 via INTRAVENOUS

## 2017-03-15 MED ORDER — ORAL CARE MOUTH RINSE
15.0000 mL | Freq: Two times a day (BID) | OROMUCOSAL | Status: DC
  Administered 2017-03-15 – 2017-03-23 (×15): 15 mL via OROMUCOSAL

## 2017-03-15 MED ORDER — ALTEPLASE 2 MG IJ SOLR: 2.0000 mg | Freq: Once | INTRAMUSCULAR | Status: DC | PRN

## 2017-03-15 MED ORDER — TRAMADOL HCL 50 MG PO TABS
50.0000 mg | ORAL_TABLET | Freq: Four times a day (QID) | ORAL | Status: DC | PRN
  Administered 2017-03-15 – 2017-03-21 (×7): 50 mg via ORAL
  Filled 2017-03-15 (×6): qty 1

## 2017-03-15 MED ORDER — IPRATROPIUM-ALBUTEROL 0.5-2.5 (3) MG/3ML IN SOLN
3.0000 mL | Freq: Four times a day (QID) | RESPIRATORY_TRACT | Status: DC
  Administered 2017-03-15 – 2017-03-16 (×4): 3 mL via RESPIRATORY_TRACT
  Filled 2017-03-15 (×4): qty 3

## 2017-03-15 MED ORDER — MUPIROCIN 2 % EX OINT
1.0000 "application " | TOPICAL_OINTMENT | Freq: Two times a day (BID) | CUTANEOUS | Status: AC
  Administered 2017-03-15 – 2017-03-19 (×9): 1 via NASAL
  Filled 2017-03-15: qty 22

## 2017-03-15 MED ORDER — LIDOCAINE-PRILOCAINE 2.5-2.5 % EX CREA: 1.0000 "application " | TOPICAL_CREAM | CUTANEOUS | Status: DC | PRN

## 2017-03-15 MED ORDER — DOXERCALCIFEROL 4 MCG/2ML IV SOLN
1.0000 ug | INTRAVENOUS | Status: DC
  Administered 2017-03-19 – 2017-03-23 (×3): 1 ug via INTRAVENOUS
  Filled 2017-03-15 (×5): qty 2

## 2017-03-15 MED ORDER — SODIUM CHLORIDE 0.9 % IV SOLN: 100.0000 mL | INTRAVENOUS | Status: DC | PRN

## 2017-03-15 MED ORDER — CHLORHEXIDINE GLUCONATE CLOTH 2 % EX PADS
6.0000 | MEDICATED_PAD | Freq: Every day | CUTANEOUS | Status: DC
  Administered 2017-03-18 – 2017-03-20 (×3): 6 via TOPICAL

## 2017-03-15 MED ORDER — HEPARIN SODIUM (PORCINE) 1000 UNIT/ML DIALYSIS: 6000.0000 [IU] | Freq: Once | INTRAMUSCULAR | Status: DC

## 2017-03-15 NOTE — Progress Notes (Signed)
PROGRESS NOTE    Kimberly [name]  ZOX:[id number] DOB: [birth year] DOA: 03/14/2017 PCP: Merlene Laughter, MD  Brief Narrative:  Kimberly [name] is a 61 y.o. female with history of ESRD on hemodialysis on Monday Wednesday Friday who started developing sharp shooting chest pain while during dialysis. Pain was nonradiating retrosternal and had some productive cough. Patient states over the last 3 weeks patient has been having some wheezing and coughing. Patient also has been having nasal drainage for which patient was prescribed nasal spray and antibiotics by patient's ENT surgeon. Patient was recently admitted for left foot infection and had undergone Left BKA. Patient also has developed new ulcers on the right foot. In the ER patient is found to be wheezing with chest x-ray showing possible infiltrates. Patient also has productive cough. Patient's clinical picture is compatible with pneumonia and was started on antibiotics. Patient has not had any chest pain the ER. EKG was showing LBBB and troponin was negative. She states she is still wheezing today.   Assessment & Plan:   Principal Problem:   HCAP (healthcare-associated pneumonia) Active Problems:   CAD (coronary artery disease), native coronary artery   COPD (chronic obstructive pulmonary disease) (HCC)   Hypothyroidism   Paroxysmal atrial fibrillation (HCC)   Diabetes mellitus with complication (HCC)   Pneumonia  Healthcare associated pneumonia   -Patient is placed on IV Vancomycin and Cefepime.  -Follow cultures. -Check Legionella Pneumophila Serogp 1 Ur Ag and Strep Pnemoniae Urin Antigen -C/w DuoNeb 4 times a day and q2hprn -Add Mucinex -C/w Pulmicort 0.25 mg Neb BID -Repeat CXR in AM  COPD exacerbation  -On exam patient has bilateral expiratory wheezes -As Above -Avoiding IV Steroids for now given recent foot infection and Right foot ulcers -C/w Supplemental O2  -Abx as above  Chest pain  -Appears atypical.  -Cycled cardiac  markers and Tropoin relatively flat at 0.21 -> 0.20 -> 0.24; Markers likely elevated due to ESRD  Left leg wound status post recent transtibial amputation also has a new one on the right leg  -WOC Nurse Consult  Diabetes Mellitus Type 2  -On Lantus 28 units and Sensitive Novolog sliding scale coverag ACe. -CBG's ranging from 45-206  Hypothyroidism -C/w Synthroid 88 mcg  Anemia probably from renal disease  -Hb/Hct went from 10.5/34.7 -> 10.3/35.5 -Continue to Follow CBC's  History of paroxysmal Atrial Fibrillation  -patient had refused anticoagulation previously. -Continue to Monitor on Telemetry   ESRD on Hemodialysis - Monday Wednesday and Friday.  -Consulted Nephrology for Maintenance of Hemodialysis. -C/w Sevelamer Carbonate 2.4 g po TID and with Doxercalciferol 1 mcg IV MWF -C/w Ferric Gluconate 125 mg IV MWF  DVT prophylaxis: Heparin 5,000 units sq q8h Code Status: DO NOT RESUSCITATE Family Communication: No family present at bedside Disposition Plan: Remain Inpatient and anticipate D/C in 24-48 hours.   Consultants:   Nephrology   Procedures: Hemodialysis    Antimicrobials:  Anti-infectives    Start     Dose/Rate Route Frequency Ordered Stop   03/15/17 2000  ceFEPIme (MAXIPIME) 1 g in dextrose 5 % 50 mL IVPB     1 g 100 mL/hr over 30 Minutes Intravenous Every 24 hours 03/14/17 1952 03/23/17 1959   03/14/17 2030  vancomycin (VANCOCIN) IVPB 1000 mg/200 mL premix     1,000 mg 200 mL/hr over 60 Minutes Intravenous  Once 03/14/17 1943 03/15/17 0019   03/14/17 1915  ceFEPIme (MAXIPIME) 2 g in dextrose 5 % 50 mL IVPB     2  g 100 mL/hr over 30 Minutes Intravenous  Once 03/14/17 1902 03/14/17 2013     Subjective: Seen and examined at bedside and was extremely hard of hearing. States she has been having bowel movements. Still wheezing some. No nasuea or vomiting. Had some back pain.   Objective: Vitals:   03/15/17 0212 03/15/17 0523 03/15/17 0819 03/15/17 0822    BP:  (!) 112/43    Pulse:  87    Resp:  18    Temp:  98.4 F (36.9 C)    TempSrc:  Oral    SpO2:  100% 100% 100%  Weight: 103.4 kg (227 lb 15.3 oz)       Intake/Output Summary (Last 24 hours) at 03/15/17 1610 Last data filed at 03/15/17 0601  Gross per 24 hour  Intake               50 ml  Output                0 ml  Net               50 ml   Filed Weights   03/14/17 2122 03/15/17 0212  Weight: 103.4 kg (228 lb) 103.4 kg (227 lb 15.3 oz)   Examination: Physical Exam:  Constitutional: Chronically ill appearing 61 yo Caucasian female in NAD and appears calm and comfortable Eyes: Lids and conjunctivae normal, sclerae anicteric  ENMT: External Ears, Nose appear normal. Extremely hard of hearing  Neck: Appears normal, supple, no cervical masses, normal ROM, no appreciable thyromegaly, no appreciable JVD Respiratory: Diminished to auscultation bilaterally with bilateral wheezing and some rhonchi; No, rales, or crackles. Slightly increased respiratory effort but no accessory muscle use Cardiovascular: RRR, no murmurs / rubs / gallops. S1 and S2 auscultated. Mild extremity edema Abdomen: Soft, non-tender, non-distended. No masses palpated. No appreciable hepatosplenomegaly. Bowel sounds positive.  GU: Deferred. Musculoskeletal: No clubbing / cyanosis of digits/nails. Patient has Left BKA Skin: Has ulcer on Right foot at the heal. No rashes or bruising on a limited skin eval. No induration; Warm and dry.  Neurologic: No appreciable focal deficits. Patient is hard of hearing. Romberg sign cerebellar reflexes not assessed.  Psychiatric: Normal judgment and insight. Alert and oriented x 3. Normal mood and appropriate affect.   Data Reviewed: I have personally reviewed following labs and imaging studies  CBC:  Recent Labs Lab 03/14/17 1750 03/14/17 1949 03/15/17 0736  WBC 10.1 10.3 10.2  NEUTROABS 7.9*  --   --   HGB 12.8 10.5* 10.3*  HCT 41.5 34.7* 35.5*  MCV 102.5* 102.4*  104.1*  PLT 249 249 262   Basic Metabolic Panel:  Recent Labs Lab 03/14/17 1750 03/14/17 1949  NA 136  --   K 3.9  --   CL 96*  --   CO2 28  --   GLUCOSE 105*  --   BUN 10  --   CREATININE 3.92* 4.17*  CALCIUM 9.3  --    GFR: Estimated Creatinine Clearance: 16.4 mL/min (A) (by C-G formula based on SCr of 4.17 mg/dL (H)). Liver Function Tests: No results for input(s): AST, ALT, ALKPHOS, BILITOT, PROT, ALBUMIN in the last 168 hours. No results for input(s): LIPASE, AMYLASE in the last 168 hours. No results for input(s): AMMONIA in the last 168 hours. Coagulation Profile: No results for input(s): INR, PROTIME in the last 168 hours. Cardiac Enzymes:  Recent Labs Lab 03/14/17 1949 03/15/17 0220  TROPONINI 0.21* 0.20*   BNP (last 3 results)  No results for input(s): PROBNP in the last 8760 hours. HbA1C: No results for input(s): HGBA1C in the last 72 hours. CBG:  Recent Labs Lab 03/14/17 1950 03/14/17 2118 03/15/17 0751  GLUCAP 103* 105* 45*   Lipid Profile: No results for input(s): CHOL, HDL, LDLCALC, TRIG, CHOLHDL, LDLDIRECT in the last 72 hours. Thyroid Function Tests: No results for input(s): TSH, T4TOTAL, FREET4, T3FREE, THYROIDAB in the last 72 hours. Anemia Panel: No results for input(s): VITAMINB12, FOLATE, FERRITIN, TIBC, IRON, RETICCTPCT in the last 72 hours. Sepsis Labs:  Recent Labs Lab 03/14/17 1925  LATICACIDVEN 1.82    No results found for this or any previous visit (from the past 240 hour(s)).   Radiology Studies: Dg Chest 2 View  Result Date: 03/14/2017 CLINICAL DATA:  PER EMS: pt is from Dialysis with c/o chest congestion after receiving 4 hours of dialysis of her usual 4.5hrs dialysis. She states today they withdrew 10 lbs of fluids from her which she states "usually they don't draw anywhere near that much fluid. Chest congestion. History of diabetes. EXAM: CHEST  2 VIEW COMPARISON:  02/18/2017 FINDINGS: Patient is slightly rotated towards  the right. Heart size is accentuated by the AP position of the patient. Shallow lung inflation. Stable elevation of right hemidiaphragm. Density at the left lung base Raises the question of early left lower lobe infiltrate or could be related to artifact from positioning. IMPRESSION: 1.  Study quality is degraded by patient positioning. 2. Possible left lower lobe infiltrate. Electronically Signed   By: Norva PavlovElizabeth  Brown M.D.   On: 03/14/2017 18:46   Scheduled Meds: . aspirin EC  81 mg Oral Daily  . budesonide (PULMICORT) nebulizer solution  0.25 mg Nebulization BID  . heparin  5,000 Units Subcutaneous Q8H  . insulin aspart  0-9 Units Subcutaneous TID WC  . insulin glargine  28 Units Subcutaneous BID  . ipratropium-albuterol  3 mL Nebulization Q4H  . levothyroxine  88 mcg Oral QAC breakfast  . mouth rinse  15 mL Mouth Rinse BID  . midodrine  10 mg Oral Daily  . multivitamin with minerals  1 tablet Oral Daily  . sevelamer carbonate  2.4 g Oral TID WC  . silver sulfADIAZINE  1 application Topical Daily   Continuous Infusions: . ceFEPime (MAXIPIME) IV      LOS: 1 day   Merlene Laughtermair Latif Sheikh, DO [facility] Pager [phone]  If 7PM-7AM, please contact night-coverage [email or web address] Password [email or web address] 03/15/2017, 8:24 AM

## 2017-03-15 NOTE — Consult Note (Addendum)
WOC Nurse wound consult note Reason for Consult: Consult requested for left stump and right heel. Wound type:Right heel with deep tissue injury; dark purple blood blister; no open wound or drainage. 4X5cm; pt states this occurred prior to admission. Pressure Injury POA: Yes Left stump with post-op surgical wound; pt had surgery in June by Dr Lajoyce Cornersuda of the ortho service. Measurement: Staples intact and well- approximated to left stump.  There are patchy areas of black eschar scattered across the location; largest is to the left outer wound; 2X2cm.  No fluctuance, odor or drainage from the eschar. Wound bed: Full thickness wound in the middle of the staple line; 2.5X2.5X2cm, dark red wound bed, mod amt green-tinged drainage, slight odor. Dressing procedure/placement/frequency:  Float right heel to reduce pressure.  Foam dressing to protect from further injury.  Pt states she is aware that deep tissue injuries are high risk to evolve into full thickness tissue loss. Xerform to staple incision, moist gauze packing to open wound on left stump. Please consult Dr Lajoyce Cornersuda to assess left stump wound since he performed surgery to this location in June.   Please re-consult if further assistance is needed.  Thank-you,  Cammie Mcgeeawn Omran Keelin MSN, RN, CWOCN, WeiserWCN-AP, CNS [phone]

## 2017-03-15 NOTE — Consult Note (Signed)
[facility] Renal Consultation Note    Indication for Consultation:  Management of ESRD/hemodialysis; anemia, hypertension/volume and secondary hyperparathyroidism PCP: [name], [name] MD  HPI: Kimberly [name] is a 61 y.o. female with ESRD secondary to cardiorenal syndrome, CVA, CAD, CHF, COPD former smoker on home O2, HTN, DM,   on at MWF dialysis schedule [facility]. She had influenza A/HCAP in January and last month left BKA 6/8/citrobacter bacteremia (also had TDC removed at that time) and [facility]. She is prone to high IDWG and yesterday had net UF of 5.6 with post weight 105.5 (EDW 104).  She is awaiting bilateral ear tube placement by Dr. Ezzard StandingNewman, ENT for fluid in her ears which has caused significant hearing loss the past several week.  She has a paralyzed vocal cord since CEA and requires pureed foods, but can drink thin liquids.  Yesterday at the end of dialysis she complained of chest tightness not CP. She requested to be sent to the ED to be evaluated for PNA. She tolerated her dialysis treatment without problems.    She has diarrhea she thinks due to antibiotics; she was on Zpak (per dialysis RN; Augmentin per admission med rec) prior to admission and had been on Ancef last month.  She has a productive cough.  Dr.Duda did her recent left BKA but she said the wound care RN was following her left heel.    Evaluation in the ED yesterday post HD showed possible infiltrate on dialysis.  EKG showed no acute change, LBBB with flat troponins.  Hgb was high immediately post HD but equilibrated to 10s.  WBC 10s with normal diff 7/4.  She has been afebrile. Blood cultures were drawn. She was started on empiric Vanc and Maxipime with nebulizer treatments.  She is due again for HD tomorrow.  Past Medical History:  Diagnosis Date  . Anemia   . Anxiety   . Arthritis    "knees, feet, hands" (08/23/2016)  . Asthma   . CAD, NATIVE VESSEL    May 10, 2010  cath showed a hyperdynamic LV function, she had dominant circumflex anatomy with a 70-80% small OM1. She had diffuse diabetic plaque particularly in the distal LAD. She nondominant RCA.  Nondominant  . Cellulitis 10/15/2013  . CHF (congestive heart failure) (HCC)    Preserved EF  . Chronic bronchitis (HCC)    "probably once/ yr" (08/23/2016)  . Complication of anesthesia    "I've had difficulty waking up" (08/23/2016)  . COPD   . Depression   . Diabetic neuropathy (HCC)   . Dyspnea   . Endometrial hyperplasia   . ESRD (end stage renal disease) on dialysis (HCC)    "[facility]; MWF; [facility]" (08/23/2016)  . Family history of adverse reaction to anesthesia    mother had hard time waking up  . GERD (gastroesophageal reflux disease)   . History of hiatal hernia   . HYPERLIPIDEMIA   . HYPERTENSION   . Hypothyroidism   . Myocardial infarct, old   . On home oxygen therapy    "2L; 24/7" (08/23/2016)  . Paralyzed vocal cords   . Peripheral neuropathy    hx/notes 01/27/2010  . Pneumonia "several times"  . Proliferative retinopathy    hx/notes 01/27/2010  . PVD    CEA  . Restless leg syndrome    "mostly on the right" (08/23/2016)  . 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 Type II diabetes mellitus (HCC)    Past Surgical History:  Procedure Laterality Date  . AMPUTATION Left 02/16/2017   Procedure: Left Below Knee Amputation;  Surgeon: Nadara Mustard, MD;  Location: [facility] OR;  Service: Orthopedics;  Laterality: Left;  . AV FISTULA PLACEMENT Right 07/08/2015   Procedure: EXPLORATION RIGHT AXILLARY ARTERY AND RIGHT BRACHIAL VEIN;  Surgeon: Chuck Hint, MD;  Location: [facility] OR;  Service: Vascular;  Laterality: Right;  . AV FISTULA PLACEMENT Right 01/02/2017   Procedure: INSERTION OF ARTERIOVENOUS GORE-TEX  GRAFT  RIGHT ARM;  Surgeon: Chuck Hint, MD;   Location[facility] OR;  Service: Vascular;  Laterality: Right;  . CAROTID ENDARTERECTOMY Left X 2  . CATARACT EXTRACTION W/ INTRAOCULAR LENS  IMPLANT, BILATERAL Bilateral   . CERVICAL BIOPSY  ~ 2015   "precancerous cells"  . COLONOSCOPY    . EYE SURGERY Bilateral    numerous surgeries  . INSERTION OF DIALYSIS CATHETER N/A 06/24/2015   Procedure: ULTRASOUND BILATERAL INTERNAL JUGULAR VEIN INSERTION OF DIALYSIS CATHETER LEFT INTERNAL JUGULAR VEIN ;  Surgeon: Pryor Ochoa, MD;  Location: [facility] OR;  Service: Vascular;  Laterality: N/A;  . INTRAUTERINE DEVICE INSERTION  ~ 2015  . IR REMOVAL TUN CV CATH W/O FL  02/15/2017  . VITRECTOMY Bilateral    Family History  Problem Relation Age of Onset  . Cancer Mother        Breast, NHL  . Stroke Mother   . Peripheral vascular disease Father   . CAD Father 63  . Heart attack Father   . Hypertension Father   . Asthma Father   . Heart disease Father        before age 7   Social History:  reports that she quit smoking about 11 years ago. Her smoking use included Cigarettes. She has a 20.00 pack-year smoking history. She has never used smokeless tobacco. She reports that she does not drink alcohol or use drugs. Allergies  Allergen Reactions  . Ciprofloxacin Itching  . Epinephrine Palpitations   Prior to Admission medications   Medication Sig Start Date End Date Taking? Authorizing Provider  albuterol (PROVENTIL HFA;VENTOLIN HFA) 108 (90 Base) MCG/ACT inhaler Inhale 2 puffs into the lungs every 6 (six) hours as needed for wheezing or shortness of breath.   Yes [provider]  amoxicillin-clavulanate (AUGMENTIN) 875-125 MG tablet Take 1 tablet by mouth 2 (two) times daily.   Yes [provider]  aspirin EC 81 MG tablet Take 81 mg by mouth daily.   Yes [provider]  docusate sodium (COLACE) 100 MG capsule Take 100 mg by mouth 2 (two) times daily.   Yes [provider]  feeding supplement (BOOST HIGH PROTEIN) LIQD  Take 1 Container by mouth 2 (two) times daily. 30ml   Yes [provider]  fluticasone (FLONASE) 50 MCG/ACT nasal spray Place 2 sprays into both nostrils at bedtime.   Yes [provider]  hydrocortisone (ANUSOL-HC) 2.5 % rectal cream Place 1 application rectally as needed for hemorrhoids or itching.   Yes [provider]  insulin aspart (NOVOLOG) 100 UNIT/ML injection Inject 0-9 Units into the skin 3 (three) times daily with meals. 02/20/17  Yes Elgergawy, Leana Roe, MD  insulin glargine (LANTUS) 100 UNIT/ML injection Inject 0.28 mLs (28 Units total) into the skin 2 (two) times daily. 02/20/17  Yes Elgergawy, Leana Roe, MD  levothyroxine (SYNTHROID, LEVOTHROID) 88 MCG  tablet Take 1 tablet (88 mcg total) by mouth daily before breakfast. 10/10/16  Yes Ngetich, Dinah C, NP  lidocaine-prilocaine (EMLA) cream Apply 1 application topically See admin instructions. Apply on Monday, Wednesday and Friday for pain prior to dialysis  The cream is 2.5 %   Yes [provider]  midodrine (PROAMATINE) 10 MG tablet Take 1 tablet (10 mg total) by mouth daily. 02/20/17  Yes Elgergawy, Leana Roe, MD  Multiple Vitamins-Minerals (DECUBI-VITE) CAPS Take 1 capsule by mouth daily.   Yes [provider]  Multiple Vitamins-Minerals (MULTIVITAMIN ADULT) CHEW Chew 1 tablet by mouth daily.   Yes [provider]  OXYGEN 2lpm 24/7- [facility]   Yes [provider]  senna (SENOKOT) 8.6 MG tablet Take 2 tablets (17.2 mg total) by mouth at bedtime as needed for constipation. 02/20/17  Yes Elgergawy, Leana Roe, MD  sevelamer carbonate (RENVELA) 2.4 g PACK Take 2.4 g by mouth 3 (three) times daily with meals. 08/31/16  Yes Dorothea Ogle, MD  traMADol (ULTRAM) 50 MG tablet Take 50 mg by mouth every 6 (six) hours as needed.   Yes [provider]  acetaminophen (TYLENOL) 325 MG tablet Take 2 tablets (650 mg total) by mouth every 6 (six) hours as needed for mild pain (or Fever >/=  101). 02/20/17   Elgergawy, Leana Roe, MD  polyethylene glycol (MIRALAX / GLYCOLAX) packet Take 17 g by mouth daily as needed for mild constipation. 02/20/17   Elgergawy, Leana Roe, MD  silver sulfADIAZINE (SILVADENE) 1 % cream Apply 1 application topically daily. 03/08/17   Adonis Huguenin, NP   Current Facility-Administered Medications  Medication Dose Route Frequency Provider Last Rate Last Dose  . acetaminophen (TYLENOL) tablet 650 mg  650 mg Oral Q6H PRN Eduard Clos, MD   650 mg at 03/15/17 0548   Or  . acetaminophen (TYLENOL) suppository 650 mg  650 mg Rectal Q6H PRN Eduard Clos, MD      . aspirin EC tablet 81 mg  81 mg Oral Daily Eduard Clos, MD   81 mg at 03/15/17 1028  . budesonide (PULMICORT) nebulizer solution 0.25 mg  0.25 mg Nebulization BID Eduard Clos, MD   0.25 mg at 03/15/17 0818  . ceFEPIme (MAXIPIME) 1 g in dextrose 5 % 50 mL IVPB  1 g Intravenous Q24H Eduard Clos, MD      . Melene Muller ON 03/16/2017] Chlorhexidine Gluconate Cloth 2 % PADS 6 each  6 each Topical Q0600 Marguerita Merles [address], DO      . heparin injection 5,000 Units  5,000 Units Subcutaneous Q8H Eduard Clos, MD   5,000 Units at 03/15/17 1355  . insulin aspart (novoLOG) injection 0-9 Units  0-9 Units Subcutaneous TID WC Eduard Clos, MD      . insulin glargine (LANTUS) injection 28 Units  28 Units Subcutaneous BID Eduard Clos, MD   28 Units at 03/15/17 1028  . ipratropium-albuterol (DUONEB) 0.5-2.5 (3) MG/3ML nebulizer solution 3 mL  3 mL Nebulization Q2H PRN Eduard Clos, MD      . ipratropium-albuterol (DUONEB) 0.5-2.5 (3) MG/3ML nebulizer solution 3 mL  3 mL Nebulization QID Eduard Clos, MD   3 mL at 03/15/17 1206  . levothyroxine (SYNTHROID, LEVOTHROID) tablet 88 mcg  88 mcg Oral QAC breakfast Eduard Clos, MD   88 mcg at 03/15/17 0840  . MEDLINE mouth rinse  15 mL Mouth Rinse BID Marguerita Merles [place], DO   15  mL at 03/15/17 1028  .  midodrine (PROAMATINE) tablet 10 mg  10 mg Oral Daily Eduard Clos, MD   10 mg at 03/15/17 1028  . multivitamin with minerals tablet 1 tablet  1 tablet Oral Daily Eduard Clos, MD   1 tablet at 03/15/17 1028  . mupirocin ointment (BACTROBAN) 2 % 1 application  1 application Nasal BID Marguerita Merles [place], DO   1 application at 03/15/17 1229  . ondansetron (ZOFRAN) tablet 4 mg  4 mg Oral Q6H PRN Eduard Clos, MD       Or  . ondansetron [facility]) injection 4 mg  4 mg Intravenous Q6H PRN Eduard Clos, MD      . polyethylene glycol (MIRALAX / GLYCOLAX) packet 17 g  17 g Oral Daily PRN Eduard Clos, MD      . senna [facility]) tablet 17.2 mg  2 tablet Oral QHS PRN Eduard Clos, MD      . sevelamer carbonate (RENVELA) powder PACK 2.4 g  2.4 g Oral TID WC Eduard Clos, MD   2.4 g at 03/15/17 1229  . silver sulfADIAZINE (SILVADENE) 1 % cream 1 application  1 application Topical Daily Eduard Clos, MD      . Melene Muller ON 03/16/2017] vancomycin (VANCOCIN) IVPB 1000 mg/200 mL premix  1,000 mg Intravenous Q M,W,F-HD Marguerita Merles [place], [place]         Recent Labs Lab 03/14/17 1750 03/14/17 1949 03/15/17 0736 03/15/17 0751  NA 136  --  137  --   K 3.9  --  3.8  --   CL 96*  --  98*  --   CO2 28  --  28  --   GLUCOSE 105*  --  47*  --   BUN 10  --  16  --   CREATININE 3.92* 4.17* 4.68*  --   CALCIUM 9.3  --  9.1  --   PHOS  --   --   --  4.4  CBC:  Recent Labs Lab 03/14/17 1750 03/14/17 1949 03/15/17 0736  WBC 10.1 10.3 10.2  NEUTROABS 7.9*  --   --   HGB 12.8 10.5* 10.3*  HCT 41.5 34.7* 35.5*  MCV 102.5* 102.4* 104.1*  PLT 249 249 262   Cardiac Enzymes:  Recent Labs Lab 03/14/17 1949 03/15/17 0220 03/15/17 0736  TROPONINI 0.21* 0.20* 0.24*     Recent Labs Lab 03/14/17 1950 03/14/17 2118 03/15/17 0751 03/15/17 0838 03/15/17 1136  GLUCAP 103* 105* 45* 101* 117*   Studies/Results: Dg Chest 2 View  Result Date:  03/14/2017 CLINICAL DATA:  PER EMS: pt is from Dialysis with c/o chest congestion after receiving 4 hours of dialysis of her usual 4.5hrs dialysis. She states today they withdrew 10 lbs of fluids from her which she states "usually they don't draw anywhere near that much fluid. Chest congestion. History of diabetes. EXAM: CHEST  2 VIEW COMPARISON:  02/18/2017 FINDINGS: Patient is slightly rotated towards the right. Heart size is accentuated by the AP position of the patient. Shallow lung inflation. Stable elevation of right hemidiaphragm. Density at the left lung base Raises the question of early left lower lobe infiltrate or could be related to artifact from positioning. IMPRESSION: 1.  Study quality is degraded by patient positioning. 2. Possible left lower lobe infiltrate. Electronically Signed   By: [name] M.D.   On: 03/14/2017 18:46    ROS: As per HPI otherwise negative.  Physical Exam:  Vitals:   03/15/17 0819 03/15/17 0822 03/15/17 0958 03/15/17 1206  BP:   107/62   Pulse:   91   Resp:   17   Temp:   97.8 F (36.6 C)   TempSrc:   Oral   SpO2: 100% 100% 100% 100%  Weight:      Height:   5\' 4"  (1.626 m)      General: obese chronically ill WF slumped over in bed sleeping but rouses, Head: NCAT sclera not icteric MMM Neck: Supple. No JVD Lungs:  Diffuse wheezes - prolonged exp Heart: RRR with S1 S2.  Abdomen: soft obese NT + BS Lower extremities:  tr edema right heel pressure ulcer non draining wrapped; left BKA wrapped with wound changes as per wound care RN note Neuro: A & O  X 3.Extremely HOH - have to speak in to right ear Psych:  Responds to questions appropriately with a normal affect.-baseline- fairly articulate about current situation Dialysis Access: right upper AVGG + bruit  Dialysis Orders:  MWF  [facility]  4.5 hours  2 K 2 Ca  400/800  EDW 104  right upper AVGG  heparin 6000 load and 5000 mid tmt  venofer 100 through 7/11 hectorol 1 mircera 150 - last  6/7  Assessment/Plan: 1. ? HCAP. Vs exacerbation of COPD - BC pending, empiric Vanc and Maxipime + nebs 2. ESRD -  MWF - HD tomorrow - titrate to EDW 3. Hypertension/volume  - chronic low BP - midodrine for BP support - prone to high IDWG but intake much more restricted here 4. Anemia  - follow CBC - hgb stable - ESA due for redose next week - not ordered yet - continue course of IV Fe 5. Metabolic bone disease -  Continue hectorol/binders 6. Nutrition - D1 diet with thin liquids- multivit; doesn't like oral supplments 7. Recent left BKA - Dr. Lajoyce Corners - with wound healing issues - as per wound RN note - rec consult Dr. Lajoyce Corners.- she was due for f/u with him this week 8. Right heel ulcer - wound care RN following- 9. DNR 10. MRSA contact precautions 11. Hx of PAF  12. Hearing loss related to bilateral ear effusions - for myringotomy tubes in the near future (speak directly into right ear)  Sheffield Slider, PA-C [facility] Beeper [phone] 03/15/2017, 2:02 PM   I have seen and examined this patient and agree with plan and assessment in the above note with renal recommendations/intervention highlighted. Admitted with ? HCAP vs COPD flare. ABT's as above. HD to continue MWF.   Horst Ostermiller B,MD 03/15/2017 3:42 PM

## 2017-03-15 NOTE — Progress Notes (Signed)
Troponin-0.20. MD notified.

## 2017-03-16 ENCOUNTER — Inpatient Hospital Stay (HOSPITAL_COMMUNITY): Payer: Medicare Other

## 2017-03-16 DIAGNOSIS — T829XXA Unspecified complication of cardiac and vascular prosthetic device, implant and graft, initial encounter: Secondary | ICD-10-CM

## 2017-03-16 DIAGNOSIS — T82868A Thrombosis of vascular prosthetic devices, implants and grafts, initial encounter: Secondary | ICD-10-CM

## 2017-03-16 DIAGNOSIS — T8781 Dehiscence of amputation stump: Secondary | ICD-10-CM

## 2017-03-16 DIAGNOSIS — J189 Pneumonia, unspecified organism: Principal | ICD-10-CM

## 2017-03-16 DIAGNOSIS — E118 Type 2 diabetes mellitus with unspecified complications: Secondary | ICD-10-CM

## 2017-03-16 LAB — COMPREHENSIVE METABOLIC PANEL
ALBUMIN: 2.4 g/dL — AB (ref 3.5–5.0)
ALT: 7 U/L — AB (ref 14–54)
AST: 19 U/L (ref 15–41)
Alkaline Phosphatase: 146 U/L — ABNORMAL HIGH (ref 38–126)
Anion gap: 12 (ref 5–15)
BILIRUBIN TOTAL: 0.6 mg/dL (ref 0.3–1.2)
BUN: 28 mg/dL — AB (ref 6–20)
CO2: 23 mmol/L (ref 22–32)
CREATININE: 5.87 mg/dL — AB (ref 0.44–1.00)
Calcium: 9 mg/dL (ref 8.9–10.3)
Chloride: 96 mmol/L — ABNORMAL LOW (ref 101–111)
GFR calc Af Amer: 8 mL/min — ABNORMAL LOW (ref >60)
GFR, EST NON AFRICAN AMERICAN: 7 mL/min — AB (ref >60)
GLUCOSE: 190 mg/dL — AB (ref 65–99)
POTASSIUM: 4.2 mmol/L (ref 3.5–5.1)
Sodium: 131 mmol/L — ABNORMAL LOW (ref 135–145)
TOTAL PROTEIN: 7.7 g/dL (ref 6.5–8.1)

## 2017-03-16 LAB — CBC WITH DIFFERENTIAL/PLATELET
BASOS ABS: 0.1 10*3/uL (ref 0.0–0.1)
BASOS PCT: 1 %
Eosinophils Absolute: 0.4 10*3/uL (ref 0.0–0.7)
Eosinophils Relative: 4 %
HEMATOCRIT: 33 % — AB (ref 36.0–46.0)
HEMOGLOBIN: 10 g/dL — AB (ref 12.0–15.0)
LYMPHS PCT: 15 %
Lymphs Abs: 1.5 10*3/uL (ref 0.7–4.0)
MCH: 31 pg (ref 26.0–34.0)
MCHC: 30.3 g/dL (ref 30.0–36.0)
MCV: 102.2 fL — AB (ref 78.0–100.0)
Monocytes Absolute: 1 10*3/uL (ref 0.1–1.0)
Monocytes Relative: 10 %
NEUTROS ABS: 6.8 10*3/uL (ref 1.7–7.7)
NEUTROS PCT: 70 %
Platelets: 221 10*3/uL (ref 150–400)
RBC: 3.23 MIL/uL — AB (ref 3.87–5.11)
RDW: 17.9 % — ABNORMAL HIGH (ref 11.5–15.5)
WBC: 9.6 10*3/uL (ref 4.0–10.5)

## 2017-03-16 LAB — GLUCOSE, CAPILLARY
GLUCOSE-CAPILLARY: 121 mg/dL — AB (ref 65–99)
GLUCOSE-CAPILLARY: 214 mg/dL — AB (ref 65–99)
Glucose-Capillary: 153 mg/dL — ABNORMAL HIGH (ref 65–99)
Glucose-Capillary: 191 mg/dL — ABNORMAL HIGH (ref 65–99)

## 2017-03-16 LAB — PHOSPHORUS: Phosphorus: 5.1 mg/dL — ABNORMAL HIGH (ref 2.5–4.6)

## 2017-03-16 LAB — MAGNESIUM: MAGNESIUM: 2.2 mg/dL (ref 1.7–2.4)

## 2017-03-16 MED ORDER — DOXERCALCIFEROL 4 MCG/2ML IV SOLN
1.0000 ug | INTRAVENOUS | Status: AC
  Administered 2017-03-17: 1 ug via INTRAVENOUS
  Filled 2017-03-16: qty 2

## 2017-03-16 MED ORDER — IPRATROPIUM-ALBUTEROL 0.5-2.5 (3) MG/3ML IN SOLN
3.0000 mL | RESPIRATORY_TRACT | Status: DC | PRN
  Administered 2017-03-18 – 2017-03-22 (×4): 3 mL via RESPIRATORY_TRACT
  Filled 2017-03-16 (×5): qty 3

## 2017-03-16 MED ORDER — IPRATROPIUM-ALBUTEROL 0.5-2.5 (3) MG/3ML IN SOLN
3.0000 mL | Freq: Three times a day (TID) | RESPIRATORY_TRACT | Status: DC
  Administered 2017-03-16: 3 mL via RESPIRATORY_TRACT
  Filled 2017-03-16 (×2): qty 3

## 2017-03-16 NOTE — Progress Notes (Addendum)
Called by renal service for clotted graft.  Will plan declot in OR tomorrow morning 730 am  Graft has clotted 10 weeks post insertion.  Small vein noted on op note.  Will attempt thrombectomy/revision but may be short lived if vein is poor quality.  If it reoccludes early will need catheter  NPO Consent  Fabienne Brunsharles Fields, MD [facility] Office: [phone] Pager: [phone]

## 2017-03-16 NOTE — Consult Note (Signed)
ORTHOPAEDIC CONSULTATION  REQUESTING PHYSICIAN: Merlene Laughter, DO  Chief Complaint: Gangrenous changes left transtibial amputation  HPI: Kimberly [name] is a 61 y.o. 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  Past Medical History:  Diagnosis Date  . Anemia   . Anxiety   . Arthritis    "knees, feet, hands" (08/23/2016)  . Asthma   . CAD, NATIVE VESSEL    May 10, 2010 cath showed a hyperdynamic LV function, she had dominant circumflex anatomy with a 70-80% small OM1. She had diffuse diabetic plaque particularly in the distal LAD. She nondominant RCA.  Nondominant  . Cellulitis 10/15/2013  . CHF (congestive heart failure) (HCC)    Preserved EF  . Chronic bronchitis (HCC)    "probably once/ yr" (08/23/2016)  . Complication of anesthesia    "I've had difficulty waking up" (08/23/2016)  . COPD   . Depression   . Diabetic neuropathy (HCC)   . Dyspnea   . Endometrial hyperplasia   . ESRD (end stage renal disease) on dialysis (HCC)    "[facility]; MWF; [facility]" (08/23/2016)  . Family history of adverse reaction to anesthesia    mother had hard time waking up  . GERD (gastroesophageal reflux disease)   . History of hiatal hernia   . HYPERLIPIDEMIA   . HYPERTENSION   . Hypothyroidism   . Myocardial infarct, old   . On home oxygen therapy    "2L; 24/7" (08/23/2016)  . Paralyzed vocal cords   . Peripheral neuropathy    hx/notes 01/27/2010  . Pneumonia "several times"  . Proliferative retinopathy    hx/notes 01/27/2010  . PVD    CEA  . Restless leg syndrome    "mostly on the right" (08/23/2016)  . 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 Type II diabetes mellitus (HCC)    Past Surgical History:   Procedure Laterality Date  . AMPUTATION Left 02/16/2017   Procedure: Left Below Knee Amputation;  Surgeon: Nadara Mustard, MD;  Location: [facility] OR;  Service: Orthopedics;  Laterality: Left;  . AV FISTULA PLACEMENT Right 07/08/2015   Procedure: EXPLORATION RIGHT AXILLARY ARTERY AND RIGHT BRACHIAL VEIN;  Surgeon: Chuck Hint, MD;  Location: [facility] OR;  Service: Vascular;  Laterality: Right;  . AV FISTULA PLACEMENT Right 01/02/2017   Procedure: INSERTION OF ARTERIOVENOUS GORE-TEX  GRAFT  RIGHT ARM;  Surgeon: Chuck Hint, MD;  Location: [facility] OR;  Service: Vascular;  Laterality: Right;  . CAROTID ENDARTERECTOMY Left X 2  . CATARACT EXTRACTION W/ INTRAOCULAR LENS  IMPLANT, BILATERAL Bilateral   . CERVICAL BIOPSY  ~ 2015   "precancerous cells"  . COLONOSCOPY    . EYE SURGERY Bilateral    numerous surgeries  . INSERTION OF DIALYSIS CATHETER N/A 06/24/2015   Procedure: ULTRASOUND BILATERAL INTERNAL JUGULAR VEIN INSERTION OF DIALYSIS CATHETER LEFT INTERNAL JUGULAR VEIN ;  Surgeon: Pryor Ochoa, MD;  Location: [facility] OR;  Service: Vascular;  Laterality: N/A;  . INTRAUTERINE DEVICE INSERTION  ~ 2015  . IR REMOVAL TUN CV CATH W/O FL  02/15/2017  . VITRECTOMY Bilateral    Social History   Social History  . Marital status: Married    Spouse name: N/A  . Number of children: 0  .  Years of education: N/A   Occupational History  . disabled    Social History Main Topics  . Smoking status: Former Smoker    Packs/day: 1.00    Years: 20.00    Types: Cigarettes    Quit date: 09/11/2005  . Smokeless tobacco: Never Used  . Alcohol use No  . Drug use: No  . Sexual activity: No   Other Topics Concern  . None   Social History Narrative  . None   Family History  Problem Relation Age of Onset  . Cancer Mother        Breast, NHL  . Stroke Mother   . Peripheral vascular disease Father   . CAD Father 41  . Heart attack Father   . Hypertension Father   . Asthma Father   . Heart disease  Father        before age 37   - negative except otherwise stated in the family history section Allergies  Allergen Reactions  . Ciprofloxacin Itching  . Epinephrine Palpitations   Prior to Admission medications   Medication Sig Start Date End Date Taking? Authorizing Provider  albuterol (PROVENTIL HFA;VENTOLIN HFA) 108 (90 Base) MCG/ACT inhaler Inhale 2 puffs into the lungs every 6 (six) hours as needed for wheezing or shortness of breath.   Yes [provider]  amoxicillin-clavulanate (AUGMENTIN) 875-125 MG tablet Take 1 tablet by mouth 2 (two) times daily.   Yes [provider]  aspirin EC 81 MG tablet Take 81 mg by mouth daily.   Yes [provider]  docusate sodium (COLACE) 100 MG capsule Take 100 mg by mouth 2 (two) times daily.   Yes [provider]  feeding supplement (BOOST HIGH PROTEIN) LIQD Take 1 Container by mouth 2 (two) times daily. 30ml   Yes [provider]  fluticasone (FLONASE) 50 MCG/ACT nasal spray Place 2 sprays into both nostrils at bedtime.   Yes [provider]  hydrocortisone (ANUSOL-HC) 2.5 % rectal cream Place 1 application rectally as needed for hemorrhoids or itching.   Yes [provider]  insulin aspart (NOVOLOG) 100 UNIT/ML injection Inject 0-9 Units into the skin 3 (three) times daily with meals. 02/20/17  Yes Elgergawy, Leana Roe, MD  insulin glargine (LANTUS) 100 UNIT/ML injection Inject 0.28 mLs (28 Units total) into the skin 2 (two) times daily. 02/20/17  Yes Elgergawy, Leana Roe, MD  levothyroxine (SYNTHROID, LEVOTHROID) 88 MCG tablet Take 1 tablet (88 mcg total) by mouth daily before breakfast. 10/10/16  Yes Ngetich, Dinah C, NP  lidocaine-prilocaine (EMLA) cream Apply 1 application topically See admin instructions. Apply on Monday, Wednesday and Friday for pain prior to dialysis  The cream is 2.5 %   Yes [provider]  midodrine (PROAMATINE) 10 MG tablet Take 1 tablet (10 mg total) by  mouth daily. 02/20/17  Yes Elgergawy, Leana Roe, MD  Multiple Vitamins-Minerals (DECUBI-VITE) CAPS Take 1 capsule by mouth daily.   Yes [provider]  Multiple Vitamins-Minerals (MULTIVITAMIN ADULT) CHEW Chew 1 tablet by mouth daily.   Yes [provider]  OXYGEN 2lpm 24/7- [facility]   Yes [provider]  senna (SENOKOT) 8.6 MG tablet Take 2 tablets (17.2 mg total) by mouth at bedtime as needed for constipation. 02/20/17  Yes Elgergawy, Leana Roe, MD  sevelamer carbonate (RENVELA) 2.4 g PACK Take 2.4 g by mouth 3 (three) times daily with meals. 08/31/16  Yes Dorothea Ogle, MD  traMADol (ULTRAM) 50 MG tablet Take 50 mg by  mouth every 6 (six) hours as needed.   Yes [provider]  acetaminophen (TYLENOL) 325 MG tablet Take 2 tablets (650 mg total) by mouth every 6 (six) hours as needed for mild pain (or Fever >/= 101). 02/20/17   Elgergawy, Leana Roeawood S, MD  polyethylene glycol (MIRALAX / GLYCOLAX) packet Take 17 g by mouth daily as needed for mild constipation. 02/20/17   Elgergawy, Leana Roeawood S, MD  silver sulfADIAZINE (SILVADENE) 1 % cream Apply 1 application topically daily. 03/08/17   Adonis HugueninZamora, Erin R, NP   Dg Chest Port 1 View  Result Date: 03/16/2017 CLINICAL DATA:  CHF. EXAM: PORTABLE CHEST 1 VIEW COMPARISON:  03/14/2017. FINDINGS: Cardiomegaly with very mild bilateral pulmonary interstitial prominence. Mild CHF cannot be excluded. Low lung volumes with improvement of left base atelectasis. No pneumothorax . IMPRESSION: 1. Cardiomegaly with very mild bilateral pulmonary interstitial prominence a mild CHF cannot be excluded. 2. Low lung volumes with improvement of left base atelectasis . Electronically Signed   By: Maisie Fushomas  Register   On: 03/16/2017 07:50   - pertinent xrays, CT, MRI studies were reviewed and independently interpreted  Positive ROS: All other systems have been reviewed and were otherwise negative with the exception of those mentioned in the HPI and as  above.  Physical Exam: General: Alert, no acute distress Psychiatric: Patient is competent for consent with normal mood and affect Lymphatic: No axillary or cervical lymphadenopathy Cardiovascular: No pedal edema Respiratory: No cyanosis, no use of accessory musculature GI: No organomegaly, abdomen is soft and non-tender  Skin: Examination patient has increasing gangrenous changes to the medial and lateral aspect of the left transtibial amputation. There is black eschar along the incision there are 2 deep ulcers that probed to bone medially and laterally these are 2 cm in diameter and approximately 4 cm deep.   Neurologic: Patient does not have protective sensation right lower extremities.   MUSCULOSKELETAL:  Examination patient has a very small residual limb for transtibial amputation. She has progressive gangrenous changes with the surgical incision nonviable. There are 2 large deep ulcers that extend down to bone medially and laterally. There is no cellulitis no drainage no signs of abscess.  Assessment: Assessment: Progressive gangrenous changes short left transtibial amputation.  Plan: Plan: Continue dressing changes as per wound ostomy nursing recommendations. Patient will need a revision to an above-knee amputation. We will need to proceed with surgery once she is medically stable. This could be done during this hospital admission once she is stabilized or could be done as an outpatient. The wounds are open do not think that she is at risk for systemic infection from this.  Thank you for the consult and the opportunity to see Ms. Elouise Munroerews  Marcus Duda, MD [facility] [phone] 6:42 PM

## 2017-03-16 NOTE — Progress Notes (Signed)
PROGRESS NOTE    Kimberly [name]  ZOX:[id number] DOB: 1956/01/16 DOA: 03/14/2017 PCP: Merlene Laughter, MD  Brief Narrative:  Kimberly [name] is a 61 y.o. female with history of ESRD on hemodialysis on Monday Wednesday Friday who started developing sharp shooting chest pain while during dialysis. Pain was nonradiating retrosternal and had some productive cough. Patient states over the last 3 weeks patient has been having some wheezing and coughing. Patient also has been having nasal drainage for which patient was prescribed nasal spray and antibiotics by patient's ENT surgeon. Patient was recently admitted for left foot infection and had undergone Left BKA. Patient also has developed new ulcers on the right foot. In the ER patient is found to be wheezing with chest x-ray showing possible infiltrates. Patient also has productive cough. Patient's clinical picture is compatible with pneumonia and was started on antibiotics. Patient has not had any chest pain the ER. EKG was showing LBBB and troponin was negative. She states she is still wheezing today. Overnight Blood Cx Grew Gram Positive Cocci in 1/2 Cultures. Her AVG also clotted off later this afternoon so Vascular Surgery was consulted and patient will be taken to OR in AM for Declotting.   Assessment & Plan:   Principal Problem:   HCAP (healthcare-associated pneumonia) Active Problems:   CAD (coronary artery disease), native coronary artery   COPD (chronic obstructive pulmonary disease) (HCC)   Hypothyroidism   Paroxysmal atrial fibrillation (HCC)   Diabetes mellitus with complication (HCC)   Pneumonia  Healthcare associated pneumonia   -Patient is placed on IV Vancomycin and Cefepime.  -Follow cultures. 1/2 Blood Cx + -MRSA by PCR was Positive -Check Legionella Pneumophila Serogp 1 Ur Ag and Strep Pnemoniae Urine pending Antigen -C/w DuoNeb 4 times a day and q2hprn -Add Mucinex -C/w Pulmicort 0.25 mg Neb BID -Repeat CXR in AM  COPD  exacerbation  -On exam patient has bilateral expiratory wheezes -As Above -Avoiding IV Steroids for now given recent foot infection and Right foot ulcers -C/w Supplemental O2  -Abx as above  Gram Positive Staphylococcus Species Bacteremia -1/2 showed Growth on 03/14/17 -?Source -On IV Vanc/Cefepime as above -Repeat Blood Cx in AM  Chest pain  -Appears atypical.  -Cycled cardiac markers and Tropoin relatively flat at 0.21 -> 0.20 -> 0.24; Markers likely elevated due to ESRD  Left leg wound status post recent transtibial amputation also has a new one on the right leg on the Right Heel  -There are patchy areas of black eschar scattered across the location; largest is to the left outer wound; 2X2cm.  No fluctuance, odor or drainage from the eschar. -WOC Nurse Consult done and Recommended consulting Dr. Lajoyce Corners to Assess Left Stump Wound -Full thickness wound in the middle of the staple line; 2.5X2.5X2cm, dark red wound bed, mod amt green-tinged drainage, slight odor. And WOC nurse recommended Dressing procedure/placement/frequency:  Float right heel to reduce pressure.  Foam dressing to protect from further injury.  Pt states she is aware that deep tissue injuries are high risk to evolve into full thickness tissue loss. -Discussed with Dr. Lajoyce Corners who recommended getting an X-Ray and he will see the patient later today  Diabetes Mellitus Type 2  -On Lantus 28 units and Sensitive Novolog sliding scale coverag ACe. -CBG's ranging from 121-191  Hypothyroidism -C/w Synthroid 88 mcg  Anemia probably from renal disease  -Hb/Hct went from 10.5/34.7 -> 10.3/35.5 -> 8.6/28.7 -> 10.0/33.0 -Monitor for S/Sx of Bleeding -Continue to Follow CBC's  History of  paroxysmal Atrial Fibrillation  -patient had refused anticoagulation previously. -Continue to Monitor on Telemetry   ESRD on Hemodialysis - Monday Wednesday and Friday.  -Consulted Nephrology for Maintenance of Hemodialysis. -C/w Sevelamer  Carbonate 2.4 g po TID and with Doxercalciferol 1 mcg IV MWF -C/w Ferric Gluconate 125 mg IV MWF -Patient was supposed to go for HD today but AVG clotted; IR unable to declot so VVS Dr. Darrick Penna was consulted and will declot patient's AVG in AM -Per Dr. Darrick Penna will attempt thrombectomy/revision but may be short lived if vein is poor quality and if re-occludes early will need catheter. -Per Nephro near EDW and K is fine   DVT prophylaxis: Heparin 5,000 units sq q8h Code Status: DO NOT RESUSCITATE Family Communication: No family present at bedside Disposition Plan: Remain Inpatient and anticipate D/C in 24-48 hours.  Consultants:   Nephrology Dr. Bufford Spikes Dr. Lajoyce Corners  Vascular Surgery Dr. Darrick Penna   Procedures: Hemodialysis    Antimicrobials:  Anti-infectives    Start     Dose/Rate Route Frequency Ordered Stop   03/16/17 1200  vancomycin (VANCOCIN) IVPB 1000 mg/200 mL premix  Status:  Discontinued     1,000 mg 200 mL/hr over 60 Minutes Intravenous Every M-W-F (Hemodialysis) 03/15/17 0851 03/16/17 1411   03/15/17 2000  ceFEPIme (MAXIPIME) 1 g in dextrose 5 % 50 mL IVPB     1 g 100 mL/hr over 30 Minutes Intravenous Every 24 hours 03/14/17 1952 03/23/17 1959   03/14/17 2030  vancomycin (VANCOCIN) IVPB 1000 mg/200 mL premix     1,000 mg 200 mL/hr over 60 Minutes Intravenous  Once 03/14/17 1943 03/15/17 0019   03/14/17 1915  ceFEPIme (MAXIPIME) 2 g in dextrose 5 % 50 mL IVPB     2 g 100 mL/hr over 30 Minutes Intravenous  Once 03/14/17 1902 03/14/17 2013     Subjective: Seen and examined at bedside and was resting in bed and is extremely hard of hearing so talked in her Right Ear. States she was coughing up clear Sputum and thinks wheezing is improving. Denied any other pain today. No CP, Nausea or Vomiting.    Objective: Vitals:   03/15/17 2227 03/16/17 0453 03/16/17 0958 03/16/17 1714  BP: (!) 99/40 (!) 98/42 (!) 115/50 (!) 136/45  Pulse: 90 87 89 86  Resp: 18 18 18 16   Temp:  98 F (36.7 C) 98 F (36.7 C) 98.4 F (36.9 C) 97.6 F (36.4 C)  TempSrc: Oral Oral Oral Oral  SpO2: 100% 100% 100% 100%  Weight: 104.3 kg (230 lb)     Height:        Intake/Output Summary (Last 24 hours) at 03/16/17 1730 Last data filed at 03/16/17 1300  Gross per 24 hour  Intake              363 ml  Output                0 ml  Net              363 ml   Filed Weights   03/14/17 2122 03/15/17 0212 03/15/17 2227  Weight: 103.4 kg (228 lb) 103.4 kg (227 lb 15.3 oz) 104.3 kg (230 lb)   Examination: Physical Exam:  Constitutional: Chronically ill appearing 61 yo Caucasian female in NAD appears calm  Eyes: Sclerae anicteric; Conjunctivae non-injected ENMT: External Ears appear normal. Extremely hard of hearing Neck: Supple with no JVD Respiratory: Diminished to auscultation with bilateral wheezing.  Cardiovascular: RRR. No  appreciable edema Abdomen: Soft, NT, ND; Bowel sounds present GU: Deferred Musculoskeletal: No contractures. Has Left BKA Skin: Warm and dry; Has black eschar on Right foot heel; Did not unwrap BKA to view today; Right upper AVG with auscultated weak bruit and diminished thrill Neurologic: No appreciable focal deficits. Patient is hard of hearing. Romberg sign cerebellar reflexes not assessed.  Psychiatric: Normal judgment and insight. Alert and oriented x 3. Normal mood and appropriate affect.   Data Reviewed: I have personally reviewed following labs and imaging studies  CBC:  Recent Labs Lab 03/14/17 1750 03/14/17 1949 03/15/17 0736 03/15/17 2220 03/16/17 0507  WBC 10.1 10.3 10.2 7.9 9.6  NEUTROABS 7.9*  --   --   --  6.8  HGB 12.8 10.5* 10.3* 8.6* 10.0*  HCT 41.5 34.7* 35.5* 28.7* 33.0*  MCV 102.5* 102.4* 104.1* 101.8* 102.2*  PLT 249 249 262 132* 221   Basic Metabolic Panel:  Recent Labs Lab 03/14/17 1750 03/14/17 1949 03/15/17 0736 03/15/17 0751 03/15/17 2219 03/16/17 0507  NA 136  --  137  --  128* 131*  K 3.9  --  3.8  --  4.4  4.2  CL 96*  --  98*  --  93* 96*  CO2 28  --  28  --  22 23  GLUCOSE 105*  --  47*  --  307* 190*  BUN 10  --  16  --  27* 28*  CREATININE 3.92* 4.17* 4.68*  --  5.55* 5.87*  CALCIUM 9.3  --  9.1  --  9.0 9.0  MG  --   --   --  1.8  --  2.2  PHOS  --   --   --  4.4 4.6 5.1*   GFR: Estimated Creatinine Clearance: 11.7 mL/min (A) (by C-G formula based on SCr of 5.87 mg/dL (H)). Liver Function Tests:  Recent Labs Lab 03/15/17 2219 03/16/17 0507  AST  --  19  ALT  --  7*  ALKPHOS  --  146*  BILITOT  --  0.6  PROT  --  7.7  ALBUMIN 2.3* 2.4*   No results for input(s): LIPASE, AMYLASE in the last 168 hours. No results for input(s): AMMONIA in the last 168 hours. Coagulation Profile: No results for input(s): INR, PROTIME in the last 168 hours. Cardiac Enzymes:  Recent Labs Lab 03/14/17 1949 03/15/17 0220 03/15/17 0736  TROPONINI 0.21* 0.20* 0.24*   BNP (last 3 results) No results for input(s): PROBNP in the last 8760 hours. HbA1C: No results for input(s): HGBA1C in the last 72 hours. CBG:  Recent Labs Lab 03/15/17 1703 03/15/17 2223 03/16/17 0733 03/16/17 1153 03/16/17 1653  GLUCAP 206* 317* 153* 191* 121*   Lipid Profile: No results for input(s): CHOL, HDL, LDLCALC, TRIG, CHOLHDL, LDLDIRECT in the last 72 hours. Thyroid Function Tests: No results for input(s): TSH, T4TOTAL, FREET4, T3FREE, THYROIDAB in the last 72 hours. Anemia Panel: No results for input(s): VITAMINB12, FOLATE, FERRITIN, TIBC, IRON, RETICCTPCT in the last 72 hours. Sepsis Labs:  Recent Labs Lab 03/14/17 1925  LATICACIDVEN 1.82    Recent Results (from the past 240 hour(s))  Blood culture (routine x 2)     Status: None (Preliminary result)   Collection Time: 03/14/17  9:38 PM  Result Value Ref Range Status   Specimen Description BLOOD LEFT ANTECUBITAL  Final   Special Requests IN PEDIATRIC BOTTLE Blood Culture adequate volume  Final   Culture NO GROWTH 2 DAYS  Final  Report Status  PENDING  Incomplete  Blood culture (routine x 2)     Status: Abnormal (Preliminary result)   Collection Time: 03/14/17  9:49 PM  Result Value Ref Range Status   Specimen Description BLOOD BLOOD LEFT FOREARM  Final   Special Requests IN PEDIATRIC BOTTLE Blood Culture adequate volume  Final   Culture  Setup Time   Final    GRAM POSITIVE COCCI IN CLUSTERS IN PEDIATRIC BOTTLE CRITICAL RESULT CALLED TO, READ BACK BY AND VERIFIED WITH: J. LEDFORD, AT 1610 03/16/17 BY D. VANHOOK    Culture STAPHYLOCOCCUS SPECIES (COAGULASE NEGATIVE) (A)  Final   Report Status PENDING  Incomplete  MRSA PCR Screening     Status: Abnormal   Collection Time: 03/15/17  9:41 AM  Result Value Ref Range Status   MRSA by PCR POSITIVE (A) NEGATIVE Final    Comment:        The GeneXpert MRSA Assay (FDA approved for NASAL specimens only), is one component of a comprehensive MRSA colonization surveillance program. It is not intended to diagnose MRSA infection nor to guide or monitor treatment for MRSA infections. CRITICAL RESULT CALLED TO, READ BACK BY AND VERIFIED WITH: C. YAP, RN AT 1210 ON 03/15/17 BY C. JESSUP, MLT.      Radiology Studies: Dg Chest 2 View  Result Date: 03/14/2017 CLINICAL DATA:  PER EMS: pt is from Dialysis with c/o chest congestion after receiving 4 hours of dialysis of her usual 4.5hrs dialysis. She states today they withdrew 10 lbs of fluids from her which she states "usually they don't draw anywhere near that much fluid. Chest congestion. History of diabetes. EXAM: CHEST  2 VIEW COMPARISON:  02/18/2017 FINDINGS: Patient is slightly rotated towards the right. Heart size is accentuated by the AP position of the patient. Shallow lung inflation. Stable elevation of right hemidiaphragm. Density at the left lung base Raises the question of early left lower lobe infiltrate or could be related to artifact from positioning. IMPRESSION: 1.  Study quality is degraded by patient positioning. 2. Possible left  lower lobe infiltrate. Electronically Signed   By: [name] M.D.   On: 03/14/2017 18:46   Dg Chest Port 1 View  Result Date: 03/16/2017 CLINICAL DATA:  CHF. EXAM: PORTABLE CHEST 1 VIEW COMPARISON:  03/14/2017. FINDINGS: Cardiomegaly with very mild bilateral pulmonary interstitial prominence. Mild CHF cannot be excluded. Low lung volumes with improvement of left base atelectasis. No pneumothorax . IMPRESSION: 1. Cardiomegaly with very mild bilateral pulmonary interstitial prominence a mild CHF cannot be excluded. 2. Low lung volumes with improvement of left base atelectasis . Electronically Signed   By: Maisie Fus  Register   On: 03/16/2017 07:50   Scheduled Meds: . aspirin EC  81 mg Oral Daily  . budesonide (PULMICORT) nebulizer solution  0.25 mg Nebulization BID  . Chlorhexidine Gluconate Cloth  6 each Topical Q0600  . doxercalciferol  1 mcg Intravenous Q M,W,F-HD  . [START ON 03/17/2017] doxercalciferol  1 mcg Intravenous Q T,Th,Sa-HD  . heparin  5,000 Units Subcutaneous Q8H  . heparin  6,000 Units Dialysis Once in dialysis  . insulin aspart  0-9 Units Subcutaneous TID WC  . insulin glargine  28 Units Subcutaneous BID  . ipratropium-albuterol  3 mL Nebulization TID  . levothyroxine  88 mcg Oral QAC breakfast  . lidocaine  1 patch Transdermal Q24H  . mouth rinse  15 mL Mouth Rinse BID  . midodrine  10 mg Oral Daily  . multivitamin with minerals  1 tablet Oral Daily  . mupirocin ointment  1 application Nasal BID  . sevelamer carbonate  2.4 g Oral TID WC  . silver sulfADIAZINE  1 application Topical Daily   Continuous Infusions: . sodium chloride    . sodium chloride    . ceFEPime (MAXIPIME) IV Stopped (03/15/17 2208)    LOS: 2 days   Merlene Laughter, DO [facility] Pager [phone]  If 7PM-7AM, please contact night-coverage [email or web address] Password [email or web address] 03/16/2017, 5:30 PM

## 2017-03-16 NOTE — Anesthesia Preprocedure Evaluation (Addendum)
Anesthesia Evaluation  Patient identified by MRN, date of birth, ID band Patient awake    Reviewed: Allergy & Precautions, H&P , NPO status , Patient's Chart, lab work & pertinent test results  Airway Mallampati: III  TM Distance: >3 FB Neck ROM: Full    Dental no notable dental hx. (+) Teeth Intact, Dental Advisory Given   Pulmonary asthma , COPD,  COPD inhaler, former smoker,    Pulmonary exam normal breath sounds clear to auscultation       Cardiovascular hypertension, Pt. on medications + CAD, + Cardiac Stents, + Peripheral Vascular Disease and +CHF  + dysrhythmias  Rhythm:Regular Rate:Normal     Neuro/Psych PSYCHIATRIC DISORDERS Anxiety Depression CVA    GI/Hepatic Neg liver ROS, GERD  ,  Endo/Other  diabetes, Insulin DependentHypothyroidism Morbid obesity  Renal/GU ESRF and DialysisRenal disease  negative genitourinary   Musculoskeletal  (+) Arthritis , Osteoarthritis,    Abdominal (+) + obese,   Peds  Hematology negative hematology ROS (+) anemia ,   Anesthesia Other Findings   Reproductive/Obstetrics negative OB ROS                             Anesthesia Physical  Anesthesia Plan  ASA: III  Anesthesia Plan: MAC   Post-op Pain Management:    Induction: Intravenous  PONV Risk Score and Plan: 4 or greater and Ondansetron, Dexamethasone, Propofol and Midazolam  Airway Management Planned:   Additional Equipment:   Intra-op Plan:   Post-operative Plan:   Informed Consent: I have reviewed the patients History and Physical, chart, labs and discussed the procedure including the risks, benefits and alternatives for the proposed anesthesia with the patient or authorized representative who has indicated his/her understanding and acceptance.   Dental advisory given  Plan Discussed with: CRNA  Anesthesia Plan Comments:        Anesthesia Quick Evaluation

## 2017-03-16 NOTE — Consult Note (Signed)
   [facility]   03/16/2017  Kimberly ManilaSusan K [name] Jul 16, 1956 [id number]  Patient assessed for re-admission in the Medicare ACO. Patient admitted form skilled nursing facility [[facility]] patient admitted with HCAP, HX of BKA, HD MWF.  Patient will likely return to skill nursing facility. Will follow for disposition and needs. For questions, please contact:  Charlesetta ShanksVictoria Astella Desir, RN BSN CCM [facility] Liaison  [phone] business mobile phone Toll free office [phone]

## 2017-03-16 NOTE — Progress Notes (Signed)
Upon arrival to hemodialysis unit access was assessed and AVG was noted to be clotted.  Susann GivensGrace Ejigiri, PA was present on the unit and notified.  Patient sent back to room.  Susann GivensGrace Ejigiri, PA to schedule declot with IR.

## 2017-03-16 NOTE — Progress Notes (Signed)
PHARMACY - PHYSICIAN COMMUNICATION CRITICAL VALUE ALERT - BLOOD CULTURE IDENTIFICATION (BCID)  7/4 Blood culture: Gram + cocci in clusters, BCID invalid   Name of physician (or Provider) Contacted: Dr. Toniann FailKakrakandy ([facility])  Changes to prescribed antibiotics required: Cont vancomycin/cefepime  Abran DukeLedford, Roben Tatsch 03/16/2017  6:53 AM

## 2017-03-16 NOTE — Progress Notes (Signed)
Inpatient Diabetes Program Recommendations  AACE/ADA: New Consensus Statement on Inpatient Glycemic Control (2015)  Target Ranges:  Prepandial:   less than 140 mg/dL      Peak postprandial:   less than 180 mg/dL (1-2 hours)      Critically ill patients:  140 - 180 mg/dL   Results for Kimberly [name], Kimberly [name] (MRN [id number]) as of 03/16/2017 10:46  Ref. Range 03/15/2017 08:38 03/15/2017 11:36 03/15/2017 17:03 03/15/2017 22:23 03/16/2017 07:33  Glucose-Capillary Latest Ref Range: 65 - 99 mg/dL 409101 (H) 811117 (H) 914206 (H) 317 (H) 153 (H)   Review of Glycemic Control  Diabetes history: DM 2 Outpatient Diabetes medications: Lantus 28 units BID, Novolog 0-9 units tid Current orders for Inpatient glycemic control: Lantus 28 units BID, Novolog 0-9 units tid  Inpatient Diabetes Program Recommendations:    Glucose increases in the 300's post prandially consider Novolog 3 units tid meal coverage in addition to correction scale if patient consumes at least 50% of meals.  Thanks,  Christena DeemShannon Tanajah Boulter RN, MSN, [facility] Inpatient Diabetes Coordinator Team Pager [phone] (8a-5p)

## 2017-03-16 NOTE — Progress Notes (Signed)
[facility] Progress Note  Subjective:   Still coughing, wears 2L  "24 hours a day" Denies dyspnea Blood culture + GPC   Objective Vitals:   03/15/17 1829 03/15/17 1945 03/15/17 2227 03/16/17 0453  BP: 109/60  (!) 99/40 (!) 98/42  Pulse: 82  90 87  Resp: 17  18 18   Temp: 98.9 F (37.2 C)  98 F (36.7 C) 98 F (36.7 C)  TempSrc: Oral  Oral Oral  SpO2: 98% 100% 100% 100%  Weight:   104.3 kg (230 lb)   Height:       Physical Exam General: obese chronically ill appearing WF, HOH, wearing nasal oxygen  Heart: RRR Lungs: diminished bilat Abdomen: soft NT ND +bS Extremities: L BKA wrapped with clean dsg; R LE trace edema chronic venous changes  Dialysis Access: RUE AVG +bruit (faint)  Additional Objective  Recent Labs Lab 03/15/17 0736 03/15/17 0751 03/15/17 2219 03/16/17 0507  NA 137  --  128* 131*  K 3.8  --  4.4 4.2  CL 98*  --  93* 96*  CO2 28  --  22 23  GLUCOSE 47*  --  307* 190*  BUN 16  --  27* 28*  CREATININE 4.68*  --  5.55* 5.87*  CALCIUM 9.1  --  9.0 9.0  PHOS  --  4.4 4.6 5.1*   Recent Labs Lab 03/15/17 2219 03/16/17 0507  AST  --  19  ALT  --  7*  ALKPHOS  --  146*  BILITOT  --  0.6  PROT  --  7.7  ALBUMIN 2.3* 2.4*     Recent Labs Lab 03/14/17 1750 03/14/17 1949 03/15/17 0736 03/15/17 2220 03/16/17 0507  WBC 10.1 10.3 10.2 7.9 9.6  NEUTROABS 7.9*  --   --   --  6.8  HGB 12.8 10.5* 10.3* 8.6* 10.0*  HCT 41.5 34.7* 35.5* 28.7* 33.0*  MCV 102.5* 102.4* 104.1* 101.8* 102.2*  PLT 249 249 262 132* 221   Blood Culture    Component Value Date/Time   SDES BLOOD BLOOD LEFT FOREARM 03/14/2017 2149   SPECREQUEST IN PEDIATRIC BOTTLE Blood Culture adequate volume 03/14/2017 2149   CULT GRAM POSITIVE COCCI 03/14/2017 2149   REPTSTATUS PENDING 03/14/2017 2149     Recent Labs Lab 03/14/17 1949 03/15/17 0220 03/15/17 0736  TROPONINI 0.21* 0.20* 0.24*     Recent Labs Lab 03/15/17 0838 03/15/17 1136 03/15/17 1703  03/15/17 2223 03/16/17 0733  GLUCAP 101* 117* 206* 317* 153*    Medications: . sodium chloride    . sodium chloride    . ceFEPime (MAXIPIME) IV Stopped (03/15/17 2208)  . ferric gluconate (FERRLECIT/NULECIT) IV    . vancomycin     . aspirin EC  81 mg Oral Daily  . budesonide (PULMICORT) nebulizer solution  0.25 mg Nebulization BID  . Chlorhexidine Gluconate Cloth  6 each Topical Q0600  . doxercalciferol  1 mcg Intravenous Q M,W,F-HD  . heparin  5,000 Units Subcutaneous Q8H  . heparin  6,000 Units Dialysis Once in dialysis  . insulin aspart  0-9 Units Subcutaneous TID WC  . insulin glargine  28 Units Subcutaneous BID  . ipratropium-albuterol  3 mL Nebulization TID  . levothyroxine  88 mcg Oral QAC breakfast  . lidocaine  1 patch Transdermal Q24H  . mouth rinse  15 mL Mouth Rinse BID  . midodrine  10 mg Oral Daily  . multivitamin with minerals  1 tablet Oral Daily  . mupirocin ointment  1 application Nasal BID  . sevelamer carbonate  2.4 g Oral TID WC  . silver sulfADIAZINE  1 application Topical Daily   Dialysis Orders:  MWF  [facility]  4.5 hours  2 K 2 Ca  400/800  EDW 104  right upper AVGG  heparin 6000 load and 5000 mid tmt  venofer 100 through 7/11 hectorol 1 mircera 150 - last 6/7  Assessment/Plan: 1. ? HCAP. Vs exacerbation of COPD - seems more likely infectious - on IV Vanc/cefepime  2. Bacteremia -  BC 7/4 + GPC - ID pending Abx as above - ? Sec to #1, also has AVG, L BKA wound  3. ESRD -  MWF - HD today on schedule  - titrate to EDW AVG with weak bruit - low AF as outpt and was referred to [facility]  - will order IR shuntogram here  4. Hypertension/volume  - chronic low BP - midodrine for BP support - prone to high IDWG but intake much more restricted here 5. Anemia  - Hgb 10.0  - ESA due for redose next week - not ordered yet - hold Fe course with infection  6. Metabolic bone disease -  Continue hectorol/binders 7. Nutrition - D1 diet with thin liquids- multivit;  doesn't like oral supplments 8. Recent left BKA - Dr. Lajoyce Corners - with wound healing issues - as per wound RN note - rec consult Dr. Lajoyce Corners.- primary will consult 9. Right heel ulcer - wound care RN following- 10. DNR 11. MRSA contact precautions 12. Hx of PAF  13. 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 + blood cultures for GPC. ATB's on board. Was for HD today but now AVG is clotted (since seen by PA on AM rounds)  (placed 01/02/17). IR says can't declot over the weekend and would place temp cath with declot next wek. Will call [facility] and see if they are willing to declot the graft so pt undergoes only 1 procedure instead of 2. K is fine today, at or near EDW 7/5 so OK volume wise.   Syncere Eble B,MD 03/16/2017 4:07 PM

## 2017-03-17 ENCOUNTER — Inpatient Hospital Stay (HOSPITAL_COMMUNITY): Payer: Medicare Other | Admitting: Certified Registered Nurse Anesthetist

## 2017-03-17 ENCOUNTER — Encounter (HOSPITAL_COMMUNITY)
Admission: EM | Disposition: A | Discharge status: Skilled Nursing Facility | Payer: Self-pay | Source: Home / Self Care | Attending: Internal Medicine

## 2017-03-17 DIAGNOSIS — T82868A Thrombosis of vascular prosthetic devices, implants and grafts, initial encounter: Secondary | ICD-10-CM

## 2017-03-17 DIAGNOSIS — R52 Pain, unspecified: Secondary | ICD-10-CM

## 2017-03-17 HISTORY — PX: THROMBECTOMY AND REVISION OF ARTERIOVENTOUS (AV) GORETEX  GRAFT: SHX6120

## 2017-03-17 LAB — CBC WITH DIFFERENTIAL/PLATELET
Basophils Absolute: 0.1 K/uL (ref 0.0–0.1)
Basophils Relative: 1 %
Eosinophils Absolute: 0.6 K/uL (ref 0.0–0.7)
Eosinophils Relative: 6 %
HCT: 33.7 % — ABNORMAL LOW (ref 36.0–46.0)
Hemoglobin: 10.2 g/dL — ABNORMAL LOW (ref 12.0–15.0)
Lymphocytes Relative: 17 %
Lymphs Abs: 1.7 K/uL (ref 0.7–4.0)
MCH: 30.8 pg (ref 26.0–34.0)
MCHC: 30.3 g/dL (ref 30.0–36.0)
MCV: 101.8 fL — ABNORMAL HIGH (ref 78.0–100.0)
Monocytes Absolute: 0.7 K/uL (ref 0.1–1.0)
Monocytes Relative: 7 %
Neutro Abs: 6.8 K/uL (ref 1.7–7.7)
Neutrophils Relative %: 69 %
Platelets: 204 K/uL (ref 150–400)
RBC: 3.31 MIL/uL — ABNORMAL LOW (ref 3.87–5.11)
RDW: 18.1 % — ABNORMAL HIGH (ref 11.5–15.5)
WBC: 10 K/uL (ref 4.0–10.5)

## 2017-03-17 LAB — RENAL FUNCTION PANEL
ANION GAP: 13 (ref 5–15)
Albumin: 2.1 g/dL — ABNORMAL LOW (ref 3.5–5.0)
BUN: 39 mg/dL — ABNORMAL HIGH (ref 6–20)
CALCIUM: 8.6 mg/dL — AB (ref 8.9–10.3)
CHLORIDE: 95 mmol/L — AB (ref 101–111)
CO2: 21 mmol/L — AB (ref 22–32)
CREATININE: 7.25 mg/dL — AB (ref 0.44–1.00)
GFR, EST AFRICAN AMERICAN: 6 mL/min — AB (ref >60)
GFR, EST NON AFRICAN AMERICAN: 5 mL/min — AB (ref >60)
Glucose, Bld: 206 mg/dL — ABNORMAL HIGH (ref 65–99)
Phosphorus: 6.1 mg/dL — ABNORMAL HIGH (ref 2.5–4.6)
Potassium: 4.9 mmol/L (ref 3.5–5.1)
SODIUM: 129 mmol/L — AB (ref 135–145)

## 2017-03-17 LAB — COMPREHENSIVE METABOLIC PANEL WITH GFR
ALT: 8 U/L — ABNORMAL LOW (ref 14–54)
AST: 19 U/L (ref 15–41)
Albumin: 2.4 g/dL — ABNORMAL LOW (ref 3.5–5.0)
Alkaline Phosphatase: 144 U/L — ABNORMAL HIGH (ref 38–126)
Anion gap: 14 (ref 5–15)
BUN: 37 mg/dL — ABNORMAL HIGH (ref 6–20)
CO2: 22 mmol/L (ref 22–32)
Calcium: 9.3 mg/dL (ref 8.9–10.3)
Chloride: 96 mmol/L — ABNORMAL LOW (ref 101–111)
Creatinine, Ser: 6.99 mg/dL — ABNORMAL HIGH (ref 0.44–1.00)
GFR calc Af Amer: 7 mL/min — ABNORMAL LOW
GFR calc non Af Amer: 6 mL/min — ABNORMAL LOW
Glucose, Bld: 113 mg/dL — ABNORMAL HIGH (ref 65–99)
Potassium: 4.9 mmol/L (ref 3.5–5.1)
Sodium: 132 mmol/L — ABNORMAL LOW (ref 135–145)
Total Bilirubin: 0.7 mg/dL (ref 0.3–1.2)
Total Protein: 8.1 g/dL (ref 6.5–8.1)

## 2017-03-17 LAB — CBC
HCT: 29 % — ABNORMAL LOW (ref 36.0–46.0)
HEMOGLOBIN: 8.9 g/dL — AB (ref 12.0–15.0)
MCH: 30.9 pg (ref 26.0–34.0)
MCHC: 30.7 g/dL (ref 30.0–36.0)
MCV: 100.7 fL — AB (ref 78.0–100.0)
Platelets: 178 10*3/uL (ref 150–400)
RBC: 2.88 MIL/uL — AB (ref 3.87–5.11)
RDW: 17.8 % — ABNORMAL HIGH (ref 11.5–15.5)
WBC: 8.9 10*3/uL (ref 4.0–10.5)

## 2017-03-17 LAB — GLUCOSE, CAPILLARY
Glucose-Capillary: 131 mg/dL — ABNORMAL HIGH (ref 65–99)
Glucose-Capillary: 123 mg/dL — ABNORMAL HIGH (ref 65–99)
Glucose-Capillary: 165 mg/dL — ABNORMAL HIGH (ref 65–99)

## 2017-03-17 LAB — POCT I-STAT 4, (NA,K, GLUC, HGB,HCT)
GLUCOSE: 99 mg/dL (ref 65–99)
HCT: 31 % — ABNORMAL LOW (ref 36.0–46.0)
Hemoglobin: 10.5 g/dL — ABNORMAL LOW (ref 12.0–15.0)
POTASSIUM: 4.6 mmol/L (ref 3.5–5.1)
Sodium: 133 mmol/L — ABNORMAL LOW (ref 135–145)

## 2017-03-17 LAB — CULTURE, BLOOD (ROUTINE X 2): SPECIAL REQUESTS: ADEQUATE

## 2017-03-17 LAB — MAGNESIUM: Magnesium: 2.3 mg/dL (ref 1.7–2.4)

## 2017-03-17 LAB — PHOSPHORUS: PHOSPHORUS: 5.4 mg/dL — AB (ref 2.5–4.6)

## 2017-03-17 SURGERY — THROMBECTOMY AND REVISION OF ARTERIOVENTOUS (AV) GORETEX  GRAFT
Anesthesia: Monitor Anesthesia Care | Site: Arm Upper | Laterality: Right

## 2017-03-17 MED ORDER — FENTANYL CITRATE (PF) 250 MCG/5ML IJ SOLN
INTRAMUSCULAR | Status: AC
  Filled 2017-03-17: qty 5

## 2017-03-17 MED ORDER — EPHEDRINE SULFATE 50 MG/ML IJ SOLN
INTRAMUSCULAR | Status: DC | PRN
  Administered 2017-03-17: 10 mg via INTRAVENOUS
  Administered 2017-03-17: 15 mg via INTRAVENOUS
  Administered 2017-03-17: 10 mg via INTRAVENOUS

## 2017-03-17 MED ORDER — ALBUTEROL SULFATE (2.5 MG/3ML) 0.083% IN NEBU
INHALATION_SOLUTION | RESPIRATORY_TRACT | Status: AC
  Administered 2017-03-17: 2.5 mg
  Filled 2017-03-17: qty 3

## 2017-03-17 MED ORDER — ALBUMIN HUMAN 25 % IV SOLN
25.0000 g | Freq: Once | INTRAVENOUS | Status: AC
  Administered 2017-03-17: 25 g via INTRAVENOUS

## 2017-03-17 MED ORDER — CALCIUM CHLORIDE 10 % IV SOLN
INTRAVENOUS | Status: DC | PRN
  Administered 2017-03-17: 200 mg via INTRAVENOUS

## 2017-03-17 MED ORDER — VASOPRESSIN 20 UNIT/ML IV SOLN
INTRAVENOUS | Status: DC | PRN
  Administered 2017-03-17 (×2): 1 [IU] via INTRAVENOUS

## 2017-03-17 MED ORDER — LIDOCAINE HCL (PF) 1 % IJ SOLN
INTRAMUSCULAR | Status: DC | PRN
  Administered 2017-03-17: 4.5 mL via SUBCUTANEOUS

## 2017-03-17 MED ORDER — DEXTROSE 5 % IV SOLN
1.5000 g | Freq: Once | INTRAVENOUS | Status: AC
  Administered 2017-03-17: 1.5 g via INTRAVENOUS

## 2017-03-17 MED ORDER — PROMETHAZINE HCL 25 MG/ML IJ SOLN: 6.2500 mg | INTRAMUSCULAR | Status: DC | PRN

## 2017-03-17 MED ORDER — THROMBIN 20000 UNITS EX SOLR
CUTANEOUS | Status: AC
  Filled 2017-03-17: qty 20000

## 2017-03-17 MED ORDER — LIDOCAINE HCL (PF) 1 % IJ SOLN
INTRAMUSCULAR | Status: AC
  Filled 2017-03-17: qty 30

## 2017-03-17 MED ORDER — PHENYLEPHRINE HCL 10 MG/ML IJ SOLN
INTRAMUSCULAR | Status: DC | PRN
  Administered 2017-03-17: 25 ug/min via INTRAVENOUS

## 2017-03-17 MED ORDER — DEXTROSE 5 % IV SOLN
INTRAVENOUS | Status: AC
  Filled 2017-03-17: qty 1.5

## 2017-03-17 MED ORDER — METOPROLOL TARTRATE 5 MG/5ML IV SOLN
INTRAVENOUS | Status: DC | PRN
  Administered 2017-03-17: 1 mg via INTRAVENOUS

## 2017-03-17 MED ORDER — HEPARIN SODIUM (PORCINE) 1000 UNIT/ML IJ SOLN
INTRAMUSCULAR | Status: DC | PRN
  Administered 2017-03-17: 5000 [IU] via INTRAVENOUS

## 2017-03-17 MED ORDER — 0.9 % SODIUM CHLORIDE (POUR BTL) OPTIME
TOPICAL | Status: DC | PRN
  Administered 2017-03-17: 1000 mL

## 2017-03-17 MED ORDER — MEPERIDINE HCL 25 MG/ML IJ SOLN: 6.2500 mg | INTRAMUSCULAR | Status: DC | PRN

## 2017-03-17 MED ORDER — HYDROMORPHONE HCL 1 MG/ML IJ SOLN: 0.2500 mg | INTRAMUSCULAR | Status: DC | PRN

## 2017-03-17 MED ORDER — HEPARIN SODIUM (PORCINE) 1000 UNIT/ML DIALYSIS
20.0000 [IU]/kg | INTRAMUSCULAR | Status: DC | PRN
  Administered 2017-03-17: 2100 [IU] via INTRAVENOUS_CENTRAL

## 2017-03-17 MED ORDER — OXYCODONE-ACETAMINOPHEN 5-325 MG PO TABS
ORAL_TABLET | ORAL | Status: AC
Start: 2017-03-17 — End: 2017-03-18
  Filled 2017-03-17: qty 1

## 2017-03-17 MED ORDER — PROPOFOL 500 MG/50ML IV EMUL
INTRAVENOUS | Status: DC | PRN
  Administered 2017-03-17: 25 ug/kg/min via INTRAVENOUS

## 2017-03-17 MED ORDER — OXYCODONE-ACETAMINOPHEN 5-325 MG PO TABS
1.0000 | ORAL_TABLET | ORAL | Status: DC | PRN
  Administered 2017-03-18 – 2017-03-22 (×4): 1 via ORAL
  Filled 2017-03-17 (×4): qty 1

## 2017-03-17 MED ORDER — PROPOFOL 10 MG/ML IV BOLUS
INTRAVENOUS | Status: AC
  Filled 2017-03-17: qty 20

## 2017-03-17 MED ORDER — ALBUMIN HUMAN 25 % IV SOLN
INTRAVENOUS | Status: AC
  Filled 2017-03-17: qty 100

## 2017-03-17 MED ORDER — VANCOMYCIN HCL IN DEXTROSE 1-5 GM/200ML-% IV SOLN
INTRAVENOUS | Status: AC
  Administered 2017-03-17: 1000 mg via INTRAVENOUS
  Filled 2017-03-17: qty 200

## 2017-03-17 MED ORDER — MIDAZOLAM HCL 2 MG/2ML IJ SOLN
INTRAMUSCULAR | Status: AC
  Filled 2017-03-17: qty 2

## 2017-03-17 MED ORDER — LIDOCAINE-PRILOCAINE 2.5-2.5 % EX CREA: 1.0000 "application " | TOPICAL_CREAM | CUTANEOUS | Status: DC | PRN

## 2017-03-17 MED ORDER — VASOPRESSIN 20 UNIT/ML IV SOLN
INTRAVENOUS | Status: AC
  Filled 2017-03-17: qty 1

## 2017-03-17 MED ORDER — VANCOMYCIN HCL IN DEXTROSE 1-5 GM/200ML-% IV SOLN
1000.0000 mg | INTRAVENOUS | Status: AC
  Administered 2017-03-17: 1000 mg via INTRAVENOUS
  Filled 2017-03-17: qty 200

## 2017-03-17 MED ORDER — DOXERCALCIFEROL 4 MCG/2ML IV SOLN
INTRAVENOUS | Status: AC
  Administered 2017-03-17: 1 ug via INTRAVENOUS
  Filled 2017-03-17: qty 2

## 2017-03-17 MED ORDER — FENTANYL CITRATE (PF) 100 MCG/2ML IJ SOLN
INTRAMUSCULAR | Status: DC | PRN
  Administered 2017-03-17: 25 ug via INTRAVENOUS

## 2017-03-17 MED ORDER — ALBUMIN HUMAN 25 % IV SOLN
12.5000 g | Freq: Once | INTRAVENOUS | Status: DC
  Filled 2017-03-17: qty 50

## 2017-03-17 MED ORDER — MIDAZOLAM HCL 5 MG/5ML IJ SOLN
INTRAMUSCULAR | Status: DC | PRN
  Administered 2017-03-17: 1 mg via INTRAVENOUS

## 2017-03-17 MED ORDER — SODIUM CHLORIDE 0.9 % IV SOLN: INTRAVENOUS | Status: DC

## 2017-03-17 MED ORDER — SODIUM CHLORIDE 0.9 % IV SOLN
INTRAVENOUS | Status: DC | PRN
  Administered 2017-03-17: 08:00:00 500 mL

## 2017-03-17 MED ORDER — HEMOSTATIC AGENTS (NO CHARGE) OPTIME
TOPICAL | Status: DC | PRN
  Administered 2017-03-17: 1 via TOPICAL

## 2017-03-17 SURGICAL SUPPLY — 39 items
ARMBAND PINK RESTRICT EXTREMIT (MISCELLANEOUS) ×4 IMPLANT
CANISTER SUCT 3000ML PPV (MISCELLANEOUS) ×2 IMPLANT
CANNULA VESSEL 3MM 2 BLNT TIP (CANNULA) ×2 IMPLANT
CATH EMB 3FR 80CM (CATHETERS) ×2 IMPLANT
CATH EMB 4FR 80CM (CATHETERS) ×4 IMPLANT
DECANTER SPIKE VIAL GLASS SM (MISCELLANEOUS) ×2 IMPLANT
DERMABOND ADVANCED (GAUZE/BANDAGES/DRESSINGS) ×1
DERMABOND ADVANCED .7 DNX12 (GAUZE/BANDAGES/DRESSINGS) ×1 IMPLANT
DRAPE X-RAY CASS 24X20 (DRAPES) IMPLANT
ELECT REM PT RETURN 9FT ADLT (ELECTROSURGICAL) ×2
ELECTRODE REM PT RTRN 9FT ADLT (ELECTROSURGICAL) ×1 IMPLANT
GLOVE BIO SURGEON STRL SZ7.5 (GLOVE) ×2 IMPLANT
GLOVE BIOGEL PI IND STRL 6.5 (GLOVE) ×2 IMPLANT
GLOVE BIOGEL PI IND STRL 7.0 (GLOVE) ×1 IMPLANT
GLOVE BIOGEL PI INDICATOR 6.5 (GLOVE) ×2
GLOVE BIOGEL PI INDICATOR 7.0 (GLOVE) ×1
GLOVE SURG SS PI 6.5 STRL IVOR (GLOVE) ×4 IMPLANT
GOWN STRL REUS W/ TWL LRG LVL3 (GOWN DISPOSABLE) ×3 IMPLANT
GOWN STRL REUS W/TWL LRG LVL3 (GOWN DISPOSABLE) ×3
GRAFT GORETEX STRT 7X10 (Vascular Products) ×2 IMPLANT
HEMOSTAT SPONGE AVITENE ULTRA (HEMOSTASIS) ×2 IMPLANT
KIT BASIN OR (CUSTOM PROCEDURE TRAY) ×2 IMPLANT
KIT ROOM TURNOVER OR (KITS) ×2 IMPLANT
LOOP VESSEL MINI RED (MISCELLANEOUS) IMPLANT
NS IRRIG 1000ML POUR BTL (IV SOLUTION) ×2 IMPLANT
PACK CV ACCESS (CUSTOM PROCEDURE TRAY) ×2 IMPLANT
PAD ARMBOARD 7.5X6 YLW CONV (MISCELLANEOUS) ×4 IMPLANT
PAD ELECT DEFIB RADIOL ZOLL (MISCELLANEOUS) IMPLANT
SET COLLECT BLD 21X3/4 12 (NEEDLE) IMPLANT
STOPCOCK 4 WAY LG BORE MALE ST (IV SETS) IMPLANT
SUT PROLENE 6 0 CC (SUTURE) ×4 IMPLANT
SUT PROLENE 7 0 BV 1 (SUTURE) ×2 IMPLANT
SUT VIC AB 3-0 SH 27 (SUTURE) ×1
SUT VIC AB 3-0 SH 27X BRD (SUTURE) ×1 IMPLANT
SUT VICRYL 4-0 PS2 18IN ABS (SUTURE) IMPLANT
SYRINGE 3CC LL L/F (MISCELLANEOUS) ×4 IMPLANT
TUBING EXTENTION W/L.L. (IV SETS) IMPLANT
UNDERPAD 30X30 (UNDERPADS AND DIAPERS) ×2 IMPLANT
WATER STERILE IRR 1000ML POUR (IV SOLUTION) ×2 IMPLANT

## 2017-03-17 NOTE — Progress Notes (Signed)
HOB remains elevated per pt request & comfort

## 2017-03-17 NOTE — Progress Notes (Signed)
HD tx initiated via 15G x2 w/o problem, pull/push/flush equally w/o problem VSS but low bp/pt asymptomatic, Dr. Juel BurrowLin called and made aware and adjustments were made to pt's HD orders and Albumin 25% 25G IVPB was ordered as well, will cont to monitor while on HD tx

## 2017-03-17 NOTE — Op Note (Addendum)
Procedure: Thrombectomy and revision of right upper arm AV graft  Preoperative diagnosis: Thrombosed AV graft right upper arm  Postoperative: Same  Anesthesia: Local with sedation  Assistant: Jess Bartersina Stevens RNFA  Operative findings: Graft revised to a more distal segment of axillary vein 7 mm PTFE end to end 3 mm vein  Operative details: After obtaining informed consent, the patient was taken to the operating room. The patient was placed in supine position the operating room table. After adequate sedation, the patient's entire right upper extremity was prepped and draped in the usual sterile fashion. Next a longitudinal incision was made in the axilla through a pre-existing scar. The incision was carried into the subcutaneous tissues to the level of the venous limb of the AV graft which was on the medial aspect of the arm. Dissection was carried down to level the venous anastomosis. The more distal axillary vein below the venous anastomosis was dissected free circumferentially. The vein was small about 3 mm just distal to the old anastomosis. The vein was dissected free circumferentially Next patient was given 5000 units of intravenous heparin. The graft was transected and the distal anastomosis debrided and removed. The proximal aspect of the graft was thrombectomized with a #4 Fogarty catheter. Multiple passes were made until all thrombotic material was removed and there was excellent arterial inflow obtained. An Arterialized plug was retrieved. The graft was thoroughly flushed with heparinized saline and occluded proximally with a fistula clamp. A new 7 mm PTFE graft was brought in the operative field. The axillary vein in the mid upper arm was controlled proximally with a small bulldog clamps. The vein was spatulated. The new graft was beveled and sewn end of graft to end of vein using a running 6-0 Prolene suture. At completion of the anastomosis it was thoroughly flushed with heparinized saline. This  was then cut to length and an end-to-end anastomosis was constructed to the prior graft using a running 6-0 Prolene suture. Just prior to completion of the anastomosis it was forebled backbled and thoroughly flushed.  The anastomosis was secured; clamps released; and there was a palpable pulse in the graft immediately. Hemostasis was obtained with avitene. The subcutaneous tissues of both incisions were reapproximated using a running 3-0 Vicryl suture. The skin of both incisions was closed with a 4 0 Vicryl subcuticular stitch. The patient tolerated the procedure well and there were no complications. Instrument sponge and needle counts were correct at the end of the case. The patient was taken to the recovery room in stable condition. The patient had a audible radial doppler at the end of the case.  Graft is ready for use.  Fabienne Brunsharles Leonard Hendler, MD [facility] [place] Office: [phone] Pager: [phone]

## 2017-03-17 NOTE — H&P (Signed)
[facility] SHORT STAY H&P  CC:  Kimberly Kimberly [name]  HPI: Pt had right arm av Kimberly [name] placed by Edilia Bo about 10 weeks ago.  Small vein and artery noted on op note.  Kimberly [name] occluded yesterday.  Pt is in hospital for treatment of pneumonia.  Currently on O2.  Past Surgical History:  Procedure Laterality Date  . AMPUTATION Left 02/16/2017   Procedure: Left Below Knee Amputation;  Surgeon: Nadara Mustard, MD;  Location: [facility] OR;  Service: Orthopedics;  Laterality: Left;  . AV FISTULA PLACEMENT Right 07/08/2015   Procedure: EXPLORATION RIGHT AXILLARY ARTERY AND RIGHT BRACHIAL VEIN;  Surgeon: Chuck Hint, MD;  Location: [facility] OR;  Service: Vascular;  Laterality: Right;  . AV FISTULA PLACEMENT Right 01/02/2017   Procedure: INSERTION OF ARTERIOVENOUS GORE-TEX  Kimberly [name]  RIGHT ARM;  Surgeon: Chuck Hint, MD;  Location[facility] OR;  Service: Vascular;  Laterality: Right;  . CAROTID ENDARTERECTOMY Left X 2  . CATARACT EXTRACTION W/ INTRAOCULAR LENS  IMPLANT, BILATERAL Bilateral   . CERVICAL BIOPSY  ~ 2015   "precancerous cells"  . COLONOSCOPY    . EYE SURGERY Bilateral    numerous surgeries  . INSERTION OF DIALYSIS CATHETER N/A 06/24/2015   Procedure: ULTRASOUND BILATERAL INTERNAL JUGULAR VEIN INSERTION OF DIALYSIS CATHETER LEFT INTERNAL JUGULAR VEIN ;  Surgeon: Pryor Ochoa, MD;  Location: [facility] OR;  Service: Vascular;  Laterality: N/A;  . INTRAUTERINE DEVICE INSERTION  ~ 2015  . IR REMOVAL TUN CV CATH W/O FL  02/15/2017  . VITRECTOMY Bilateral      Past Medical History:  Diagnosis Date  . Anemia   . Anxiety   . Arthritis    "knees, feet, hands" (08/23/2016)  . Asthma   . CAD, NATIVE VESSEL    May 10, 2010 cath showed a hyperdynamic LV function, she had dominant circumflex anatomy with a 70-80% small OM1. She had diffuse diabetic plaque particularly in the distal LAD. She nondominant RCA.  Nondominant  . Cellulitis 10/15/2013  . CHF (congestive heart failure)  (HCC)    Preserved EF  . Chronic bronchitis (HCC)    "probably once/ yr" (08/23/2016)  . Complication of anesthesia    "I've had difficulty waking up" (08/23/2016)  . COPD   . Depression   . Diabetic neuropathy (HCC)   . Dyspnea   . Endometrial hyperplasia   . ESRD (end stage renal disease) on dialysis (HCC)    "[facility]; MWF; [facility]" (08/23/2016)  . Family history of adverse reaction to anesthesia    mother had hard time waking up  . GERD (gastroesophageal reflux disease)   . History of hiatal hernia   . HYPERLIPIDEMIA   . HYPERTENSION   . Hypothyroidism   . Myocardial infarct, old   . On home oxygen therapy    "2L; 24/7" (08/23/2016)  . Paralyzed vocal cords   . Peripheral neuropathy    hx/notes 01/27/2010  . Pneumonia "several times"  . Proliferative retinopathy    hx/notes 01/27/2010  . PVD    CEA  . Restless leg syndrome    "mostly on the right" (08/23/2016)  . 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 Type II diabetes mellitus (HCC)     FH:  Non-Contributory  Social HX Social History  Substance Use Topics  . Smoking status: Former Smoker  Packs/day: 1.00    Years: 20.00    Types: Cigarettes    Quit date: 09/11/2005  . Smokeless tobacco: Never Used  . Alcohol use No    Allergies Allergies  Allergen Reactions  . Ciprofloxacin Itching  . Epinephrine Palpitations    Medications Current Facility-Administered Medications  Medication Dose Route Frequency Provider Last Rate Last Dose  . [MAR Hold] 0.9 %  sodium chloride infusion  100 mL Intravenous PRN Weston SettleBergman, Martha, PA-C      . [MAR Hold] 0.9 %  sodium chloride infusion  100 mL Intravenous PRN Weston SettleBergman, Martha, PA-C      . 0.9 %  sodium chloride infusion   Intravenous Continuous Lewie LoronGermeroth, John, MD      . Mitzi Hansen[MAR Hold] acetaminophen (TYLENOL) tablet 650 mg  650 mg Oral Q6H PRN Eduard ClosKakrakandy,  Arshad N, MD   650 mg at 03/15/17 0548   Or  . [MAR Hold] acetaminophen (TYLENOL) suppository 650 mg  650 mg Rectal Q6H PRN Eduard ClosKakrakandy, Arshad N, MD      . Mitzi Hansen[MAR Hold] alteplase (CATHFLO ACTIVASE) injection 2 mg  2 mg Intracatheter Once PRN Weston SettleBergman, Martha, PA-C      . [MAR Hold] aspirin EC tablet 81 mg  81 mg Oral Daily Eduard ClosKakrakandy, Arshad N, MD   81 mg at 03/16/17 1015  . [MAR Hold] budesonide (PULMICORT) nebulizer solution 0.25 mg  0.25 mg Nebulization BID Eduard ClosKakrakandy, Arshad N, MD   0.25 mg at 03/16/17 1953  . [MAR Hold] ceFEPIme (MAXIPIME) 1 g in dextrose 5 % 50 mL IVPB  1 g Intravenous Q24H Eduard ClosKakrakandy, Arshad N, MD   Stopped at 03/16/17 2124  . cefUROXime (ZINACEF) 1.5 g in dextrose 5 % 50 mL IVPB  1.5 g Intravenous Once Sherren KernsFields, Afrika Brick E, MD      . Mitzi Hansen[MAR Hold] Chlorhexidine Gluconate Cloth 2 % PADS 6 each  6 each Topical Q0600 Sheikh, Omair Latif, DO      . dextrose 5 % with cefUROXime (ZINACEF) ADS Med           . Mitzi Hansen[MAR Hold] doxercalciferol (HECTOROL) injection 1 mcg  1 mcg Intravenous Q M,W,F-HD Sheikh, Omair Latif, DO      . [MAR Hold] doxercalciferol (HECTOROL) injection 1 mcg  1 mcg Intravenous Q T,Th,Sa-HD Sheikh, Omair [place] HeightsLatif, DO      . [place][MAR Hold] heparin injection 1,000 Units  1,000 Units Dialysis PRN Weston SettleBergman, Martha, PA-C      . [MAR Hold] heparin injection 5,000 Units  5,000 Units Subcutaneous Q8H Eduard ClosKakrakandy, Arshad N, MD   5,000 Units at 03/16/17 2054  . [MAR Hold] heparin injection 6,000 Units  6,000 Units Dialysis Once in dialysis Weston SettleBergman, Martha, PA-C      . [MAR Hold] insulin aspart (novoLOG) injection 0-9 Units  0-9 Units Subcutaneous TID WC Eduard ClosKakrakandy, Arshad N, MD   1 Units at 03/16/17 1736  . [MAR Hold] insulin glargine (LANTUS) injection 28 Units  28 Units Subcutaneous BID Eduard ClosKakrakandy, Arshad N, MD   28 Units at 03/16/17 2305  . [MAR Hold] ipratropium-albuterol (DUONEB) 0.5-2.5 (3) MG/3ML nebulizer solution 3 mL  3 mL Nebulization Q4H PRN Sheikh, Omair Latif, DO      . [MAR  Hold] ipratropium-albuterol (DUONEB) 0.5-2.5 (3) MG/3ML nebulizer solution 3 mL  3 mL Nebulization TID Marguerita MerlesSheikh, Omair Latif, DO   3 mL at 03/16/17 1953  . [MAR Hold] levothyroxine (SYNTHROID, LEVOTHROID) tablet 88 mcg  88 mcg Oral QAC breakfast Eduard ClosKakrakandy, Arshad N, MD   88 mcg at 03/16/17  0810  . [MAR Hold] lidocaine (LIDODERM) 5 % 1 patch  1 patch Transdermal Q24H Sheikh, Omair [place], DO      . [MAR Hold] lidocaine (PF) (XYLOCAINE) 1 % injection 5 mL  5 mL Intradermal PRN Weston Settle, PA-C      . [MAR Hold] lidocaine-prilocaine (EMLA) cream 1 application  1 application Topical PRN Weston Settle, PA-C      . [facility] Hold] MEDLINE mouth rinse  15 mL Mouth Rinse BID Marguerita Merles [place], DO   15 mL at 03/16/17 2056  . [MAR Hold] midodrine (PROAMATINE) tablet 10 mg  10 mg Oral Daily Eduard Clos, MD   10 mg at 03/16/17 1015  . [MAR Hold] multivitamin with minerals tablet 1 tablet  1 tablet Oral Daily Eduard Clos, MD   1 tablet at 03/16/17 1015  . [MAR Hold] mupirocin ointment (BACTROBAN) 2 % 1 application  1 application Nasal BID Marguerita Merles [place], DO   1 application at 03/16/17 2056  . [MAR Hold] ondansetron (ZOFRAN) tablet 4 mg  4 mg Oral Q6H PRN Eduard Clos, MD       Or  . Mitzi Hansen Hold] ondansetron [facility]) injection 4 mg  4 mg Intravenous Q6H PRN Eduard Clos, MD      . Mitzi Hansen Hold] pentafluoroprop-tetrafluoroeth (GEBAUERS) aerosol 1 application  1 application Topical PRN Weston Settle, PA-C      . [MAR Hold] polyethylene glycol (MIRALAX / GLYCOLAX) packet 17 g  17 g Oral Daily PRN Eduard Clos, MD      . Mitzi Hansen Hold] senna (SENOKOT) tablet 17.2 mg  2 tablet Oral QHS PRN Eduard Clos, MD      . Mitzi Hansen Hold] sevelamer carbonate (RENVELA) powder PACK 2.4 g  2.4 g Oral TID WC Eduard Clos, MD   2.4 g at 03/16/17 0810  . [MAR Hold] silver sulfADIAZINE (SILVADENE) 1 % cream 1 application  1 application Topical Daily Eduard Clos, MD      .  Mitzi Hansen Hold] traMADol Janean Sark) tablet 50 mg  50 mg Oral Q6H PRN Marguerita Merles [place], DO   50 mg at 03/16/17 1901    Labs  CBC    Component Value Date/Time   WBC 10.0 03/17/2017 0558   RBC 3.31 (L) 03/17/2017 0558   HGB 10.2 (L) 03/17/2017 0558   HCT 33.7 (L) 03/17/2017 0558   PLT 204 03/17/2017 0558   MCV 101.8 (H) 03/17/2017 0558   MCH 30.8 03/17/2017 0558   MCHC 30.3 03/17/2017 0558   RDW 18.1 (H) 03/17/2017 0558   LYMPHSABS 1.7 03/17/2017 0558   MONOABS 0.7 03/17/2017 0558   EOSABS 0.6 03/17/2017 0558   BASOSABS 0.1 03/17/2017 0558    BMET    Component Value Date/Time   NA 132 (L) 03/17/2017 0558   NA 134 (A) 10/06/2016   K 4.9 03/17/2017 0558   CL 96 (L) 03/17/2017 0558   CO2 22 03/17/2017 0558   GLUCOSE 113 (H) 03/17/2017 0558   BUN 37 (H) 03/17/2017 0558   BUN 24 (A) 10/06/2016   CREATININE 6.99 (H) 03/17/2017 0558   CALCIUM 9.3 03/17/2017 0558   GFRNONAA 6 (L) 03/17/2017 0558   GFRAA 7 (L) 03/17/2017 0558    PHYSICAL EXAM  Vitals:   03/16/17 2132 03/17/17 0559  BP: (!) 120/53 (!) 92/49  Pulse: 94 87  Resp: 20 18  Temp: 98.1 F (36.7 C) 98.4 F (36.9 C)    General:  WDWN in NAD HENT:  WNL Eyes: Pupils equal Pulmonary: normal non-labored breathing , without Rales, rhonchi,  wheezing Cardiac: RRR Vascular Exam/Pulses:  No bruit in Kimberly [name] right arm   Impression: Thrombosed right arm Kimberly [name] with small vessels.  Plan: Attempt thrombectomy today. Catheter if unsuccessful  Fabienne Bruns @TODAY @ 7:26 AM

## 2017-03-17 NOTE — Progress Notes (Signed)
Attempted to waste percocet 5/325mg  that I pulled for pt, opened, then pt refused, got a nurse from 6E, Meriam Spragueina Issacs, RN to come witness waste of pill in the Pyxis, logged in and it would not let her waste, it said "unathorized witness", she went back and spoke w/ pt's primary nurse Selena BattenKim, RN and she stated it wouldn't let her witness the waste either, Neysa Bonitoina Isaccs, RN was witness to opened pill wasted in the sharps container on Machine 10, will call pharmacy to make them aware as well

## 2017-03-17 NOTE — Addendum Note (Signed)
Addendum  created 03/17/17 1305 by Dorie RankQuinn, Zebulon Gantt M, CRNA   Charge Capture section accepted, Visit diagnoses modified

## 2017-03-17 NOTE — Transfer of Care (Signed)
Immediate Anesthesia Transfer of Care Note  Patient: Kimberly [name]  Procedure(s) Performed: Procedure(s): THROMBECTOMY AND REVISION OF ARM  ARTERIOVENTOUS GORETEX  GRAFT USING A 7MM BY 10CM GORTEX GRAFT  (Right)  Patient Location: PACU  Anesthesia Type:MAC  Level of Consciousness: awake, alert  and oriented  Airway & Oxygen Therapy: Patient connected to nasal cannula oxygen  Post-op Assessment: Report given to RN  Post vital signs: stable  Last Vitals:  Vitals:   03/16/17 2132 03/17/17 0559  BP: (!) 120/53 (!) 92/49  Pulse: 94 87  Resp: 20 18  Temp: 36.7 C 36.9 C    Last Pain:  Vitals:   03/17/17 0559  TempSrc: Oral  PainSc:       Patients Stated Pain Goal: 2 (47/12/52 7129)  Complications: cardiovascular complications

## 2017-03-17 NOTE — Progress Notes (Signed)
PROGRESS NOTE    Kimberly [name]  WUJ:[id number] DOB: May 17, 1956 DOA: 03/14/2017 PCP: Merlene Laughter, MD  Brief Narrative:  Kimberly [name] is a 61 y.o. female with history of ESRD on hemodialysis on Monday Wednesday Friday who started developing sharp shooting chest pain while during dialysis. Pain was nonradiating retrosternal and had some productive cough. Patient states over the last 3 weeks patient has been having some wheezing and coughing. Patient also has been having nasal drainage for which patient was prescribed nasal spray and antibiotics by patient's ENT surgeon. Patient was recently admitted for left foot infection and had undergone Left BKA. Patient also has developed new ulcers on the right foot. In the ER patient is found to be wheezing with chest x-ray showing possible infiltrates. Patient also has productive cough. Patient's clinical picture is compatible with pneumonia and was started on antibiotics. Patient has not had any chest pain the ER. EKG was showing LBBB and troponin was negative. She states she is still wheezing today. Overnight Blood Cx Grew Gram Positive Cocci in 1/2 Cultures. Her AVG also clotted off later yesterday afternoon so Vascular Surgery was consulted and patient underwent thrombectomy and graft revision by Vascular Surgeon Dr. Fabienne Bruns. She is to undergo dialysis again today.   Assessment & Plan:   Principal Problem:   HCAP (healthcare-associated pneumonia) Active Problems:   CAD (coronary artery disease), native coronary artery   COPD (chronic obstructive pulmonary disease) (HCC)   Hypothyroidism   Paroxysmal atrial fibrillation (HCC)   Diabetes mellitus with complication (HCC)   Pneumonia   Dehiscence of amputation stump (HCC)  Healthcare associated pneumonia   -Patient is placed on IV Vancomycin and Cefepime and will de-escalate in AM -Follow cultures. 1/2 Blood Cx + and liklely was a contaminant  -MRSA by PCR was Positive -Check Legionella  Pneumophila Serogp 1 Ur Ag and Strep Pnemoniae Urine Antigen pending  -C/w DuoNeb 4 times a day and q2hprn -Added Mucinex -C/w Pulmicort 0.25 mg Neb BID -Repeat CXR in AM  COPD exacerbation  -On exam patient has bilateral expiratory wheezes -As Above -Avoiding IV Steroids for now given recent foot infection and Right foot ulcers -C/w Supplemental O2  -Abx as above  Gram Positive Staphylococcus Species Bacteremia -1/2 showed Growth on 03/14/17 -?Source but suspect a contaminant  -On IV Vanc/Cefepime as above but will likely De-escalate -Repeat Blood Cx in AM  Chest pain  -Appears atypical.  -Cycled cardiac markers and Tropoin relatively flat at 0.21 -> 0.20 -> 0.24;  -Markers likely elevated due to ESRD -Continue to Montior   Left leg wound status post recent transtibial amputation also has a new one on the right leg on the Right Heel  -There are patchy areas of black eschar scattered across the location; largest is to the left outer wound; 2X2cm.  No fluctuance, odor or drainage from the eschar. -WOC Nurse Consult done and Recommended consulting Dr. Lajoyce Corners to Assess Left Stump Wound -Full thickness wound in the middle of the staple line; 2.5X2.5X2cm, dark red wound bed, mod amt green-tinged drainage, slight odor. And WOC nurse recommended Dressing procedure/placement/frequency:  Float right heel to reduce pressure.  Foam dressing to protect from further injury.  Pt states she is aware that deep tissue injuries are high risk to evolve into full thickness tissue loss. -Discussed with Dr. Lajoyce Corners who recommended getting an X-Ray which showed No acute abnormality or explanation for femur pain. Below-the-knee amputation, resection margins are smooth. Skin staples in place. Skin defect in  the surgical wound medially that does not extend to bone -Dr. Lajoyce Corners recommending continuing dressing changes per wound ostomy nursing and states patient will need a revision to an AKA and will proceed once  medically stable during this hospitalization or done as an outpatient   Diabetes Mellitus Type 2  -On Lantus 28 units and Sensitive Novolog Sliding Scale Coverage AC. -CBG's ranging from 123-165  Hypothyroidism -C/w Synthroid 88 mcg  Anemia probably from renal disease  -Hb/Hct went from 10.5/34.7 -> 10.3/35.5 -> 8.6/28.7 -> 10.0/33.0 -> 10.2/33.7 -> 10.5/31.5 -Monitor for S/Sx of Bleeding -Continue to Follow CBC's  History of paroxysmal Atrial Fibrillation  -Patient had refused anticoagulation previously. -Continue to Monitor on Telemetry   ESRD on Hemodialysis - Monday Wednesday and Friday.  -Consulted Nephrology for Maintenance of Hemodialysis. -C/w Sevelamer Carbonate 2.4 g po TID and with Doxercalciferol 1 mcg IV MWF -C/w Ferric Gluconate 125 mg IV MWF -Patient was supposed to go for HD yesterday but AVG clotted; IR unable to declot so VVS Dr. Darrick Penna was consulted -Dr. Darrick Penna did thrombectomy and graft revision today and Graft was ok to use -Per Nephro will get Dialysis today  DVT prophylaxis: Heparin 5,000 units sq q8h Code Status: DO NOT RESUSCITATE Family Communication: No family present at bedside Disposition Plan: Remain Inpatient and anticipate D/C in 24-48 hours if improving.  Consultants:   Nephrology Dr. Bufford Spikes Dr. Lajoyce Corners  Vascular Surgery Dr. Darrick Penna   Procedures: Hemodialysis    Antimicrobials:  Anti-infectives    Start     Dose/Rate Route Frequency Ordered Stop   03/17/17 1900  vancomycin (VANCOCIN) IVPB 1000 mg/200 mL premix     1,000 mg 200 mL/hr over 60 Minutes Intravenous Every T-Th-Sa (Hemodialysis) 03/17/17 1829 03/20/17 1159   03/17/17 0730  cefUROXime (ZINACEF) 1.5 g in dextrose 5 % 50 mL IVPB     1.5 g 100 mL/hr over 30 Minutes Intravenous  Once 03/17/17 0722 03/17/17 0805   03/17/17 0723  dextrose 5 % with cefUROXime (ZINACEF) ADS Med    Comments:  Shireen Quan   : cabinet override      03/17/17 0723 03/17/17 0750   03/16/17 1200   vancomycin (VANCOCIN) IVPB 1000 mg/200 mL premix  Status:  Discontinued     1,000 mg 200 mL/hr over 60 Minutes Intravenous Every M-W-F (Hemodialysis) 03/15/17 0851 03/16/17 1411   03/15/17 2000  ceFEPIme (MAXIPIME) 1 g in dextrose 5 % 50 mL IVPB     1 g 100 mL/hr over 30 Minutes Intravenous Every 24 hours 03/14/17 1952 03/23/17 1959   03/14/17 2030  vancomycin (VANCOCIN) IVPB 1000 mg/200 mL premix     1,000 mg 200 mL/hr over 60 Minutes Intravenous  Once 03/14/17 1943 03/15/17 0019   03/14/17 1915  ceFEPIme (MAXIPIME) 2 g in dextrose 5 % 50 mL IVPB     2 g 100 mL/hr over 30 Minutes Intravenous  Once 03/14/17 1902 03/14/17 2013     Subjective: Seen and examined at bedside after her Graft revision and stated she was having abdominal and leg pain. No nausea or vomiting. No CP or SOB. Denied any other complaints or concerns at this time.     Objective: Vitals:   03/17/17 1115 03/17/17 1118 03/17/17 1125 03/17/17 1153  BP:  (!) 89/37  (!) 76/37  Pulse: 90 91 90 90  Resp: 19 13 (!) 21 18  Temp: (!) 97.3 F (36.3 C)   97.8 F (36.6 C)  TempSrc:    Oral  SpO2:  100% 100% 95%  Weight:      Height:        Intake/Output Summary (Last 24 hours) at 03/17/17 1835 Last data filed at 03/17/17 1300  Gross per 24 hour  Intake              990 ml  Output               50 ml  Net              940 ml   Filed Weights   03/15/17 0212 03/15/17 2227 03/16/17 2132  Weight: 103.4 kg (227 lb 15.3 oz) 104.3 kg (230 lb) 105.7 kg (233 lb)   Examination: Physical Exam:  Constitutional: Obese WN/WD Caucasian in NAD but appears uncomfortable in pain  Eyes: Sclerae anicteric. Conjunctivae non-injected.  ENMT: Extremely hard of hearing. Mucous membranes appear moist.  Neck: Supple with no JVD.  Respiratory:  Diminished bilaterally with some mild expiratory wheezing. Patient was not tachypenic or using any accessory muscles to breathe.  Cardiovascular: RRR; S1 S2. Mild extremity edema Abdomen: Soft,  Tender to palpate, Distended due to body habitus. Bowel sounds present GU: Defererd Musculoskeletal: Has a Left BKA. No cyanosis; Has RU arm AVG with good palpable thrill and ausculted bruit Skin: Right foot heel ulcer and Left leg stump with some wounds. No rashes or lesions;  Neurologic: Extremely hard of hearing and have to speak loudly. No appreciable focal deficits Psychiatric: Pleasant mood and affect with intact judgement and insight/  Data Reviewed: I have personally reviewed following labs and imaging studies  CBC:  Recent Labs Lab 03/14/17 1750 03/14/17 1949 03/15/17 0736 03/15/17 2220 03/16/17 0507 03/17/17 0558 03/17/17 0717  WBC 10.1 10.3 10.2 7.9 9.6 10.0  --   NEUTROABS 7.9*  --   --   --  6.8 6.8  --   HGB 12.8 10.5* 10.3* 8.6* 10.0* 10.2* 10.5*  HCT 41.5 34.7* 35.5* 28.7* 33.0* 33.7* 31.0*  MCV 102.5* 102.4* 104.1* 101.8* 102.2* 101.8*  --   PLT 249 249 262 132* 221 204  --    Basic Metabolic Panel:  Recent Labs Lab 03/14/17 1750 03/14/17 1949 03/15/17 0736 03/15/17 0751 03/15/17 2219 03/16/17 0507 03/17/17 0558 03/17/17 0717  NA 136  --  137  --  128* 131* 132* 133*  K 3.9  --  3.8  --  4.4 4.2 4.9 4.6  CL 96*  --  98*  --  93* 96* 96*  --   CO2 28  --  28  --  22 23 22   --   GLUCOSE 105*  --  47*  --  307* 190* 113* 99  BUN 10  --  16  --  27* 28* 37*  --   CREATININE 3.92* 4.17* 4.68*  --  5.55* 5.87* 6.99*  --   CALCIUM 9.3  --  9.1  --  9.0 9.0 9.3  --   MG  --   --   --  1.8  --  2.2 2.3  --   PHOS  --   --   --  4.4 4.6 5.1* 5.4*  --    GFR: Estimated Creatinine Clearance: 9.9 mL/min (A) (by C-G formula based on SCr of 6.99 mg/dL (H)). Liver Function Tests:  Recent Labs Lab 03/15/17 2219 03/16/17 0507 03/17/17 0558  AST  --  19 19  ALT  --  7* 8*  ALKPHOS  --  146* 144*  BILITOT  --  0.6 0.7  PROT  --  7.7 8.1  ALBUMIN 2.3* 2.4* 2.4*   No results for input(s): LIPASE, AMYLASE in the last 168 hours. No results for input(s):  AMMONIA in the last 168 hours. Coagulation Profile: No results for input(s): INR, PROTIME in the last 168 hours. Cardiac Enzymes:  Recent Labs Lab 03/14/17 1949 03/15/17 0220 03/15/17 0736  TROPONINI 0.21* 0.20* 0.24*   BNP (last 3 results) No results for input(s): PROBNP in the last 8760 hours. HbA1C: No results for input(s): HGBA1C in the last 72 hours. CBG:  Recent Labs Lab 03/16/17 1653 03/16/17 2131 03/17/17 0948 03/17/17 1153 03/17/17 1652  GLUCAP 121* 214* 131* 123* 165*   Lipid Profile: No results for input(s): CHOL, HDL, LDLCALC, TRIG, CHOLHDL, LDLDIRECT in the last 72 hours. Thyroid Function Tests: No results for input(s): TSH, T4TOTAL, FREET4, T3FREE, THYROIDAB in the last 72 hours. Anemia Panel: No results for input(s): VITAMINB12, FOLATE, FERRITIN, TIBC, IRON, RETICCTPCT in the last 72 hours. Sepsis Labs:  Recent Labs Lab 03/14/17 1925  LATICACIDVEN 1.82    Recent Results (from the past 240 hour(s))  Blood culture (routine x 2)     Status: None (Preliminary result)   Collection Time: 03/14/17  9:38 PM  Result Value Ref Range Status   Specimen Description BLOOD LEFT ANTECUBITAL  Final   Special Requests IN PEDIATRIC BOTTLE Blood Culture adequate volume  Final   Culture NO GROWTH 3 DAYS  Final   Report Status PENDING  Incomplete  Blood culture (routine x 2)     Status: Abnormal   Collection Time: 03/14/17  9:49 PM  Result Value Ref Range Status   Specimen Description BLOOD BLOOD LEFT FOREARM  Final   Special Requests IN PEDIATRIC BOTTLE Blood Culture adequate volume  Final   Culture  Setup Time   Final    GRAM POSITIVE COCCI IN CLUSTERS IN PEDIATRIC BOTTLE CRITICAL RESULT CALLED TO, READ BACK BY AND VERIFIED WITH: J. LEDFORD, AT [id number] 03/16/17 BY D. VANHOOK    Culture (A)  Final    STAPHYLOCOCCUS SPECIES (COAGULASE NEGATIVE) THE SIGNIFICANCE OF ISOLATING THIS ORGANISM FROM A SINGLE SET OF BLOOD CULTURES WHEN MULTIPLE SETS ARE DRAWN IS UNCERTAIN.  PLEASE NOTIFY THE MICROBIOLOGY DEPARTMENT WITHIN ONE WEEK IF SPECIATION AND SENSITIVITIES ARE REQUIRED.    Report Status 03/17/2017 FINAL  Final  MRSA PCR Screening     Status: Abnormal   Collection Time: 03/15/17  9:41 AM  Result Value Ref Range Status   MRSA by PCR POSITIVE (A) NEGATIVE Final    Comment:        The GeneXpert MRSA Assay (FDA approved for NASAL specimens only), is one component of a comprehensive MRSA colonization surveillance program. It is not intended to diagnose MRSA infection nor to guide or monitor treatment for MRSA infections. CRITICAL RESULT CALLED TO, READ BACK BY AND VERIFIED WITH: C. YAP, RN AT 1210 ON 03/15/17 BY C. JESSUP, MLT.      Radiology Studies: Dg Chest Port 1 View  Result Date: 03/16/2017 CLINICAL DATA:  CHF. EXAM: PORTABLE CHEST 1 VIEW COMPARISON:  03/14/2017. FINDINGS: Cardiomegaly with very mild bilateral pulmonary interstitial prominence. Mild CHF cannot be excluded. Low lung volumes with improvement of left base atelectasis. No pneumothorax . IMPRESSION: 1. Cardiomegaly with very mild bilateral pulmonary interstitial prominence a mild CHF cannot be excluded. 2. Low lung volumes with improvement of left base atelectasis . Electronically Signed   By: Maisie Fushomas  Register   On: 03/16/2017 07:50  Dg Femur Min 2 Views Left  Result Date: 03/16/2017 CLINICAL DATA:  Left femur pain.  Post left BKA 02/16/2017 EXAM: LEFT FEMUR 2 VIEWS COMPARISON:  None. FINDINGS: Cortical margins of the left femur are intact. No fracture or focal lesion. Hip and knee alignment are maintained. Below-the-knee amputation with skin staples in place. Soft tissue defect is noted medially about the surgical wound that does not extend to bone. Resection margins are smooth. IMPRESSION: 1. No acute abnormality or explanation for femur pain. 2. Below-the-knee amputation, resection margins are smooth. Skin staples in place. Skin defect in the surgical wound medially that does not extend to  bone. Electronically Signed   By: Rubye Oaks M.D.   On: 03/16/2017 20:04   Scheduled Meds: . aspirin EC  81 mg Oral Daily  . budesonide (PULMICORT) nebulizer solution  0.25 mg Nebulization BID  . Chlorhexidine Gluconate Cloth  6 each Topical Q0600  . doxercalciferol  1 mcg Intravenous Q M,W,F-HD  . doxercalciferol  1 mcg Intravenous Q T,Th,Sa-HD  . heparin  5,000 Units Subcutaneous Q8H  . heparin  6,000 Units Dialysis Once in dialysis  . insulin aspart  0-9 Units Subcutaneous TID WC  . insulin glargine  28 Units Subcutaneous BID  . levothyroxine  88 mcg Oral QAC breakfast  . lidocaine  1 patch Transdermal Q24H  . mouth rinse  15 mL Mouth Rinse BID  . midodrine  10 mg Oral Daily  . multivitamin with minerals  1 tablet Oral Daily  . mupirocin ointment  1 application Nasal BID  . sevelamer carbonate  2.4 g Oral TID WC  . silver sulfADIAZINE  1 application Topical Daily   Continuous Infusions: . sodium chloride Stopped (03/17/17 0945)  . sodium chloride    . ceFEPime (MAXIPIME) IV Stopped (03/16/17 2124)  . vancomycin      LOS: 3 days   Merlene Laughter, DO [facility] Pager [phone]  If 7PM-7AM, please contact night-coverage [email or web address] Password [facility] 03/17/2017, 6:35 PM

## 2017-03-17 NOTE — Progress Notes (Signed)
Pharmacy Antibiotic Note  Kimberly [name] is a 61 y.o. female admitted on 03/14/2017 with pneumonia. Day # 3 of vancomycin / cefepime Blood cultures with contaminant Afebrile, WBC WNL  HD MWF   Plan: Continue Cefepime 1 gram iv Q 24 hours Vancomycin with HD, none given Friday due to lost access Can antibiotics be de-escalated?  Height: 5\' 4"  (162.6 cm) Weight: 233 lb (105.7 kg) IBW/kg (Calculated) : 54.7  Temp (24hrs), Avg:97.7 F (36.5 C), Min:97.2 F (36.2 C), Max:98.4 F (36.9 C)   Recent Labs Lab 03/14/17 1925 03/14/17 1949 03/15/17 0736 03/15/17 2219 03/15/17 2220 03/16/17 0507 03/17/17 0558  WBC  --  10.3 10.2  --  7.9 9.6 10.0  CREATININE  --  4.17* 4.68* 5.55*  --  5.87* 6.99*  LATICACIDVEN 1.82  --   --   --   --   --   --     Estimated Creatinine Clearance: 9.9 mL/min (A) (by C-G formula based on SCr of 6.99 mg/dL (H)).    Allergies  Allergen Reactions  . Ciprofloxacin Itching  . Epinephrine Palpitations    Antimicrobials this admission: cefepime 7/4 >>  vancomycin 7/4 >>   Dose adjustments this admission: 7/4: Vancomycin 2000mg  x1  Microbiology results: 7/4 BCx: GPC, contaminant  Thank you Okey RegalLisa Bettie Capistran, PharmD [phone]   03/17/2017 11:57 AM

## 2017-03-17 NOTE — Anesthesia Postprocedure Evaluation (Signed)
Anesthesia Post Note  Patient: Kimberly [name]  Procedure(s) Performed: Procedure(s) (LRB): THROMBECTOMY AND REVISION OF ARM  ARTERIOVENTOUS GORETEX  GRAFT USING A 7MM BY 10CM GORTEX GRAFT  (Right)     Patient location during evaluation: PACU Anesthesia Type: MAC Level of consciousness: awake and alert Pain management: pain level controlled Vital Signs Assessment: post-procedure vital signs reviewed and stable Respiratory status: spontaneous breathing Cardiovascular status: stable Anesthetic complications: no    Last Vitals:  Vitals:   03/17/17 1125 03/17/17 1153  BP:  (!) 76/37  Pulse: 90 90  Resp: (!) 21 18  Temp:  36.6 C    Last Pain:  Vitals:   03/17/17 1153  TempSrc: Oral  PainSc:                  Nolon Nations

## 2017-03-17 NOTE — Progress Notes (Signed)
Pt remained on neo drip  from OR  to titrate to keep SBP above 85- gtt off at 1050.

## 2017-03-17 NOTE — Transfer of Care (Deleted)
Immediate Anesthesia Transfer of Care Note  Patient: Kimberly [name]  Procedure(s) Performed: Procedure(s): THROMBECTOMY AND REVISION OF ARM  ARTERIOVENTOUS GORETEX  GRAFT USING A 7MM BY 10CM GORTEX GRAFT  (Right)  Patient Location: PACU  Anesthesia Type:General  Level of Consciousness: awake, alert  and oriented  Airway & Oxygen Therapy: Patient connected to nasal cannula oxygen  Post-op Assessment: Post -op Vital signs reviewed and stable  Post vital signs: stable  Last Vitals:  Vitals:   03/16/17 2132 03/17/17 0559  BP: (!) 120/53 (!) 92/49  Pulse: 94 87  Resp: 20 18  Temp: 36.7 C 36.9 C    Last Pain:  Vitals:   03/17/17 0559  TempSrc: Oral  PainSc:       Patients Stated Pain Goal: 2 (03/16/17 2248)  Complications: No apparent anesthesia complications and cardiovascular complications

## 2017-03-17 NOTE — Progress Notes (Signed)
[facility] Progress Note  Subjective:  Seen in room. Had AVG declot/revision per Dr. Darrick PennaFields this morning Glad AVG fixed but feels "horrible" all over    Objective Vitals:   03/17/17 1115 03/17/17 1118 03/17/17 1125 03/17/17 1153  BP:  (!) 89/37  (!) 76/37  Pulse: 90 91 90 90  Resp: 19 13 (!) 21 18  Temp: (!) 97.3 F (36.3 C)   97.8 F (36.6 C)  TempSrc:    Oral  SpO2:  100% 100% 95%  Weight:      Height:       Physical Exam General: obese chronically ill appearing WF, extremely HOH, wearing nasal oxygen  Regular S1S2 No S3 Lungs: diminished bilat Abdomen soft NT ND +bS Extremities: L BKA wrapped with clean dsg; R LE trace edema chronic venous changes  Dialysis Access: RUE AVG +strong bruit (post surgery)   Recent Labs Lab 03/15/17 2219 03/16/17 0507 03/17/17 0558 03/17/17 0717  NA 128* 131* 132* 133*  K 4.4 4.2 4.9 4.6  CL 93* 96* 96*  --   CO2 22 23 22   --   GLUCOSE 307* 190* 113* 99  BUN 27* 28* 37*  --   CREATININE 5.55* 5.87* 6.99*  --   CALCIUM 9.0 9.0 9.3  --   PHOS 4.6 5.1* 5.4*  --     Recent Labs Lab 03/15/17 2219 03/16/17 0507 03/17/17 0558  AST  --  19 19  ALT  --  7* 8*  ALKPHOS  --  146* 144*  BILITOT  --  0.6 0.7  PROT  --  7.7 8.1  ALBUMIN 2.3* 2.4* 2.4*     Recent Labs Lab 03/14/17 1750 03/14/17 1949 03/15/17 0736 03/15/17 2220 03/16/17 0507 03/17/17 0558 03/17/17 0717  WBC 10.1 10.3 10.2 7.9 9.6 10.0  --   NEUTROABS 7.9*  --   --   --  6.8 6.8  --   HGB 12.8 10.5* 10.3* 8.6* 10.0* 10.2* 10.5*  HCT 41.5 34.7* 35.5* 28.7* 33.0* 33.7* 31.0*  MCV 102.5* 102.4* 104.1* 101.8* 102.2* 101.8*  --   PLT 249 249 262 132* 221 204  --    Blood Culture    Component Value Date/Time   SDES BLOOD BLOOD LEFT FOREARM 03/14/2017 2149   SPECREQUEST IN PEDIATRIC BOTTLE Blood Culture adequate volume 03/14/2017 2149   CULT (A) 03/14/2017 2149    STAPHYLOCOCCUS SPECIES (COAGULASE NEGATIVE) THE SIGNIFICANCE OF ISOLATING THIS  ORGANISM FROM A SINGLE SET OF BLOOD CULTURES WHEN MULTIPLE SETS ARE DRAWN IS UNCERTAIN. PLEASE NOTIFY THE MICROBIOLOGY DEPARTMENT WITHIN ONE WEEK IF SPECIATION AND SENSITIVITIES ARE REQUIRED.    REPTSTATUS 03/17/2017 FINAL 03/14/2017 2149     Recent Labs Lab 03/14/17 1949 03/15/17 0220 03/15/17 0736  TROPONINI 0.21* 0.20* 0.24*     Recent Labs Lab 03/16/17 1153 03/16/17 1653 03/16/17 2131 03/17/17 0948 03/17/17 1153  GLUCAP 191* 121* 214* 131* 123*    Medications: . sodium chloride Stopped (03/17/17 0945)  . sodium chloride    . ceFEPime (MAXIPIME) IV Stopped (03/16/17 2124)   . aspirin EC  81 mg Oral Daily  . budesonide (PULMICORT) nebulizer solution  0.25 mg Nebulization BID  . Chlorhexidine Gluconate Cloth  6 each Topical Q0600  . doxercalciferol  1 mcg Intravenous Q M,W,F-HD  . doxercalciferol  1 mcg Intravenous Q T,Th,Sa-HD  . heparin  5,000 Units Subcutaneous Q8H  . heparin  6,000 Units Dialysis Once in dialysis  . insulin aspart  0-9 Units Subcutaneous TID  WC  . insulin glargine  28 Units Subcutaneous BID  . ipratropium-albuterol  3 mL Nebulization TID  . levothyroxine  88 mcg Oral QAC breakfast  . lidocaine  1 patch Transdermal Q24H  . mouth rinse  15 mL Mouth Rinse BID  . midodrine  10 mg Oral Daily  . multivitamin with minerals  1 tablet Oral Daily  . mupirocin ointment  1 application Nasal BID  . sevelamer carbonate  2.4 g Oral TID WC  . silver sulfADIAZINE  1 application Topical Daily   . sodium chloride Stopped (03/17/17 0945)  . sodium chloride    . ceFEPime (MAXIPIME) IV Stopped (03/16/17 2124)   Dialysis Orders:  MWF  [facility]  4.5 hours  2 K 2 Ca  400/800  EDW 104  right upper AVGG  heparin 6000 load and 5000 mid tmt  venofer 100 through 7/11 hectorol 1 mircera 150 - last 6/7  Assessment/Plan: 1. ? HCAP. Vs exacerbation of COPD - seems more likely infectious - on IV Vanc/cefepime  2. Positive blood culture  -  BC 7/4 +Coag neg staph in  one bottle - felt to be contaminant  3. ESRD -  MWF - missed yesterday with clotted access - For HD today off schedule 4. Clotted AVG - s/p declot revision with Dr. Darrick Penna 7/7. Appreciate VVS assistance.  5. Hypertension/volume  - chronic low BP - midodrine for BP support - prone to high IDWG but intake much more restricted here 6. Anemia  - Hgb 10.5  - ESA due for redose next week - not ordered yet - hold Fe course with infection  7. Metabolic bone disease -  Continue hectorol/binders 8. Nutrition - D1 diet with thin liquids- multivit; doesn't like oral supplments 9. Recent left BKA - Dr. Lajoyce Corners - with wound healing issues - seen by Dr. Lajoyce Corners recommending revision  10. Right heel ulcer - wound care RN following- 11. DNR 12. MRSA contact precautions 13. Hx of PAF  14. 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 For HD off schedule later  today post declot and revision of AVG by Dr. Darrick Penna today (then back to usual MWF) Dr. Lajoyce Corners indicates pt needs revision of LLE BKA to AKA due to progressive gangrenous change/non-viable surgical incision. MAYBE during this hospital admission.  Shelli Portilla B,MD 03/17/2017 1:19 PM

## 2017-03-18 ENCOUNTER — Inpatient Hospital Stay (HOSPITAL_COMMUNITY): Payer: Medicare Other

## 2017-03-18 DIAGNOSIS — R0603 Acute respiratory distress: Secondary | ICD-10-CM

## 2017-03-18 LAB — COMPREHENSIVE METABOLIC PANEL
ALBUMIN: 2.9 g/dL — AB (ref 3.5–5.0)
ALT: 7 U/L — AB (ref 14–54)
AST: 22 U/L (ref 15–41)
Alkaline Phosphatase: 124 U/L (ref 38–126)
Anion gap: 15 (ref 5–15)
BILIRUBIN TOTAL: 0.9 mg/dL (ref 0.3–1.2)
BUN: 15 mg/dL (ref 6–20)
CO2: 20 mmol/L — ABNORMAL LOW (ref 22–32)
CREATININE: 3.56 mg/dL — AB (ref 0.44–1.00)
Calcium: 8.5 mg/dL — ABNORMAL LOW (ref 8.9–10.3)
Chloride: 97 mmol/L — ABNORMAL LOW (ref 101–111)
GFR calc Af Amer: 15 mL/min — ABNORMAL LOW (ref >60)
GFR, EST NON AFRICAN AMERICAN: 13 mL/min — AB (ref >60)
GLUCOSE: 81 mg/dL (ref 65–99)
Potassium: 3.7 mmol/L (ref 3.5–5.1)
Sodium: 132 mmol/L — ABNORMAL LOW (ref 135–145)
TOTAL PROTEIN: 7.3 g/dL (ref 6.5–8.1)

## 2017-03-18 LAB — CBC WITH DIFFERENTIAL/PLATELET
Basophils Absolute: 0.1 10*3/uL (ref 0.0–0.1)
Basophils Relative: 1 %
EOS PCT: 3 %
Eosinophils Absolute: 0.3 10*3/uL (ref 0.0–0.7)
HEMATOCRIT: 28.8 % — AB (ref 36.0–46.0)
HEMOGLOBIN: 8.7 g/dL — AB (ref 12.0–15.0)
LYMPHS ABS: 1.2 10*3/uL (ref 0.7–4.0)
LYMPHS PCT: 14 %
MCH: 31.1 pg (ref 26.0–34.0)
MCHC: 30.2 g/dL (ref 30.0–36.0)
MCV: 102.9 fL — AB (ref 78.0–100.0)
MONOS PCT: 6 %
Monocytes Absolute: 0.5 10*3/uL (ref 0.1–1.0)
NEUTROS PCT: 76 %
Neutro Abs: 6.5 10*3/uL (ref 1.7–7.7)
Platelets: 168 10*3/uL (ref 150–400)
RBC: 2.8 MIL/uL — ABNORMAL LOW (ref 3.87–5.11)
RDW: 18.4 % — ABNORMAL HIGH (ref 11.5–15.5)
WBC: 8.6 10*3/uL (ref 4.0–10.5)

## 2017-03-18 LAB — MAGNESIUM: Magnesium: 2 mg/dL (ref 1.7–2.4)

## 2017-03-18 LAB — GLUCOSE, CAPILLARY
GLUCOSE-CAPILLARY: 96 mg/dL (ref 65–99)
Glucose-Capillary: 104 mg/dL — ABNORMAL HIGH (ref 65–99)
Glucose-Capillary: 89 mg/dL (ref 65–99)
Glucose-Capillary: 177 mg/dL — ABNORMAL HIGH (ref 65–99)
Glucose-Capillary: 319 mg/dL — ABNORMAL HIGH (ref 65–99)
Glucose-Capillary: 478 mg/dL — ABNORMAL HIGH (ref 65–99)

## 2017-03-18 LAB — PHOSPHORUS: PHOSPHORUS: 2.8 mg/dL (ref 2.5–4.6)

## 2017-03-18 MED ORDER — METHYLPREDNISOLONE SODIUM SUCC 40 MG IJ SOLR
40.0000 mg | Freq: Two times a day (BID) | INTRAMUSCULAR | Status: DC
  Administered 2017-03-18 – 2017-03-20 (×5): 40 mg via INTRAVENOUS
  Filled 2017-03-18 (×6): qty 1

## 2017-03-18 MED ORDER — SODIUM CHLORIDE 3 % IN NEBU: 4.0000 mL | INHALATION_SOLUTION | Freq: Every day | RESPIRATORY_TRACT | Status: DC

## 2017-03-18 MED ORDER — INSULIN ASPART 100 UNIT/ML ~~LOC~~ SOLN
7.0000 [IU] | Freq: Once | SUBCUTANEOUS | Status: AC
  Administered 2017-03-18: 7 [IU] via SUBCUTANEOUS

## 2017-03-18 MED ORDER — LORAZEPAM 0.5 MG PO TABS
0.5000 mg | ORAL_TABLET | Freq: Four times a day (QID) | ORAL | Status: DC | PRN
  Administered 2017-03-18 – 2017-03-22 (×6): 0.5 mg via ORAL
  Filled 2017-03-18 (×5): qty 1

## 2017-03-18 MED ORDER — SODIUM CHLORIDE 3 % IN NEBU
4.0000 mL | INHALATION_SOLUTION | Freq: Every day | RESPIRATORY_TRACT | Status: DC
  Administered 2017-03-20: 4 mL via RESPIRATORY_TRACT
  Filled 2017-03-18 (×3): qty 4

## 2017-03-18 MED ORDER — DARBEPOETIN ALFA 100 MCG/0.5ML IJ SOSY
100.0000 ug | PREFILLED_SYRINGE | INTRAMUSCULAR | Status: DC
  Filled 2017-03-18: qty 0.5

## 2017-03-18 MED ORDER — VANCOMYCIN HCL IN DEXTROSE 1-5 GM/200ML-% IV SOLN
1000.0000 mg | INTRAVENOUS | Status: DC
  Administered 2017-03-19: 1000 mg via INTRAVENOUS
  Filled 2017-03-18: qty 200

## 2017-03-18 MED ORDER — DM-GUAIFENESIN ER 30-600 MG PO TB12
1.0000 | ORAL_TABLET | Freq: Two times a day (BID) | ORAL | Status: DC | PRN
  Administered 2017-03-20 – 2017-03-21 (×2): 1 via ORAL
  Filled 2017-03-18 (×2): qty 1

## 2017-03-18 NOTE — Progress Notes (Signed)
During the night, the patient began to call out "Help me!  Help me!".  Upon assessment, she states she cannot breath and cannot cough up her secretions.  Oxygen levels were 98% to 100% on 2 LPM.  BS were decreased.  Upon talking to patient, she admitted she felt she was having a panic attack.  MD made aware.  Chest Xray Stat ordered.  Patient refused PO ativan for anxiety.  She had called husband and he had calmed her down.  She refused the Mucinex stating it has not helped her in the past.  Will continue to monitor patient.  Bernie CoveyKimberly Montrell Cessna RN-BC, [employer]

## 2017-03-18 NOTE — Progress Notes (Signed)
PROGRESS NOTE    Kimberly [name]  WUJ:[id number] DOB: [birth year] DOA: 03/14/2017 PCP: Merlene Laughter, MD  Brief Narrative:  Kimberly [name] is a 61 y.o. female with history of ESRD on hemodialysis on Monday Wednesday Friday who started developing sharp shooting chest pain while during dialysis. Pain was nonradiating retrosternal and had some productive cough. Patient states over the last 3 weeks patient has been having some wheezing and coughing. Patient also has been having nasal drainage for which patient was prescribed nasal spray and antibiotics by patient's ENT surgeon. Patient was recently admitted for left foot infection and had undergone Left BKA. Patient also has developed new ulcers on the right foot. In the ER patient is found to be wheezing with chest x-ray showing possible infiltrates. Patient also has productive cough. Patient's clinical picture is compatible with pneumonia and was started on antibiotics. Patient has not had any chest pain the ER. EKG was showing LBBB and troponin was negative. She states she is still wheezing today. Overnight Blood Cx Grew Gram Positive Cocci in 1/2 Cultures. Her AVG also clotted off later yesterday afternoon so Vascular Surgery was consulted and patient underwent thrombectomy and graft revision by Vascular Surgeon Dr. Fabienne Bruns. She underwent dialysis again yesterday. Overnight she had a panic attack and had anxiety and was calmed by her husband.   Assessment & Plan:   Principal Problem:   HCAP (healthcare-associated pneumonia) Active Problems:   CAD (coronary artery disease), native coronary artery   COPD (chronic obstructive pulmonary disease) (HCC)   Hypothyroidism   Paroxysmal atrial fibrillation (HCC)   Diabetes mellitus with complication (HCC)   Pneumonia   Dehiscence of amputation stump (HCC)  Healthcare associated pneumonia   -Patient is placed on IV Vancomycin and Cefepime and will de-escalate in AM -Follow cultures. 1/2 Blood Cx +  and liklely was a contaminant; Repeat Cx Pending   -MRSA by PCR was Positive -Check Legionella Pneumophila Serogp 1 Ur Ag and Strep Pnemoniae Urine Antigen pending  -C/w DuoNeb 4 times a day and q2hprn -Added Mucinex -Added Flutter Valve and Incentive Spirometry -Adde Hypertonic Saline Nebs x 3 days -If not improving may need Pulmonary Consult  -C/w Pulmicort 0.25 mg Neb BID -Started IV Steroids 40 mg q12h -Repeat CXR this AM showed Lungs hypoexpanded. Bilateral central airspace opacification, worse on the left, may reflect asymmetric pulmonary edema or possibly pneumonia. Borderline cardiomegaly.  COPD exacerbation  -On exam patient has bilateral expiratory wheezes -As Above -Started IV Steroids 40 mg q12h  -C/w Supplemental O2  -Abx as above  Gram Positive Staphylococcus Species Bacteremia -1/2 showed Growth on 03/14/17 -?Source but suspect a contaminant  -On IV Vanc/Cefepime as above but will likely De-escalate -Repeat Blood Cx Pending   Chest pain  -Appears atypical.  -Cycled cardiac markers and Tropoin relatively flat at 0.21 -> 0.20 -> 0.24;  -Markers likely elevated due to ESRD -Continue to Montior   Left leg wound status post recent transtibial amputation also has a new one on the right leg on the Right Heel  -There are patchy areas of black eschar scattered across the location; largest is to the left outer wound; 2X2cm.  No fluctuance, odor or drainage from the eschar. -WOC Nurse Consult done and Recommended consulting Dr. Lajoyce Corners to Assess Left Stump Wound -Full thickness wound in the middle of the staple line; 2.5X2.5X2cm, dark red wound bed, mod amt green-tinged drainage, slight odor. And WOC nurse recommended Dressing procedure/placement/frequency:  Float right heel to reduce pressure.  Foam dressing to protect from further injury.  Pt states she is aware that deep tissue injuries are high risk to evolve into full thickness tissue loss. -Discussed with Dr. Lajoyce Corners who  recommended getting an X-Ray which showed No acute abnormality or explanation for femur pain. Below-the-knee amputation, resection margins are smooth. Skin staples in place. Skin defect in the surgical wound medially that does not extend to bone -Dr. Lajoyce Corners recommending continuing dressing changes per wound ostomy nursing and states patient will need a revision to an AKA and will proceed once medically stable during this hospitalization or done as an outpatient   Diabetes Mellitus Type 2  -On Lantus 28 units and Sensitive Novolog Sliding Scale Coverage AC. -CBG's ranging from 96-319 -May need to adjust Sensitive Scale to Moderate or Resistant now that patient is on IV Steroids -Will discuss with Diabetes Education Coordinator   Hypothyroidism -C/w Synthroid 88 mcg  Anemia probably from renal disease  -Hb/Hct went from 10.5/34.7 -> 10.3/35.5 -> 8.6/28.7 -> 10.0/33.0 -> 10.2/33.7 -> 10.5/31.5 -> 8.9/29.0 -> 8.7/28.8 -Monitor for S/Sx of Bleeding -Continue to Follow CBC's  History of paroxysmal Atrial Fibrillation  -Patient had refused anticoagulation previously. -Continue to Monitor on Telemetry   ESRD on Hemodialysis - Monday Wednesday and Friday.  -Consulted Nephrology for Maintenance of Hemodialysis. -C/w Sevelamer Carbonate 2.4 g po TID and with Doxercalciferol 1 mcg IV MWF -Ferric Gluconate 125 mg IV MWF held due to infection -Patient was supposed to go for HD yesterday but AVG clotted; IR unable to declot so VVS Dr. Darrick Penna was consulted -Dr. Darrick Penna did thrombectomy and graft revision 03/17/17 and Graft was ok to use -Per Nephro will get Dialysis tomorrow  DVT prophylaxis: Heparin 5,000 units sq q8h Code Status: DO NOT RESUSCITATE Family Communication: Husband at bedside Disposition Plan: Remain Inpatient and anticipate D/C in 24-48 hours if improving.  Consultants:   Nephrology Dr. Camille Bal  Ortho Dr. Aldean Baker  Vascular Surgery Dr. Fabienne Bruns   Procedures:  Hemodialysis    Antimicrobials:  Anti-infectives    Start     Dose/Rate Route Frequency Ordered Stop   03/19/17 1200  vancomycin (VANCOCIN) IVPB 1000 mg/200 mL premix     1,000 mg 200 mL/hr over 60 Minutes Intravenous Every M-W-F (Hemodialysis) 03/18/17 1128     03/17/17 1900  vancomycin (VANCOCIN) IVPB 1000 mg/200 mL premix     1,000 mg 200 mL/hr over 60 Minutes Intravenous Every T-Th-Sa (Hemodialysis) 03/17/17 1829 03/18/17 0015   03/17/17 0730  cefUROXime (ZINACEF) 1.5 g in dextrose 5 % 50 mL IVPB     1.5 g 100 mL/hr over 30 Minutes Intravenous  Once 03/17/17 0722 03/17/17 0805   03/17/17 0723  dextrose 5 % with cefUROXime (ZINACEF) ADS Med    Comments:  Shireen Quan   : cabinet override      03/17/17 0723 03/17/17 0750   03/16/17 1200  vancomycin (VANCOCIN) IVPB 1000 mg/200 mL premix  Status:  Discontinued     1,000 mg 200 mL/hr over 60 Minutes Intravenous Every M-W-F (Hemodialysis) 03/15/17 0851 03/16/17 1411   03/15/17 2000  ceFEPIme (MAXIPIME) 1 g in dextrose 5 % 50 mL IVPB     1 g 100 mL/hr over 30 Minutes Intravenous Every 24 hours 03/14/17 1952 03/23/17 1959   03/14/17 2030  vancomycin (VANCOCIN) IVPB 1000 mg/200 mL premix     1,000 mg 200 mL/hr over 60 Minutes Intravenous  Once 03/14/17 1943 03/15/17 0019   03/14/17 1915  ceFEPIme (MAXIPIME) 2  g in dextrose 5 % 50 mL IVPB     2 g 100 mL/hr over 30 Minutes Intravenous  Once 03/14/17 1902 03/14/17 2013     Subjective: Seen and examined at bedside and was feeling better than yesterday but had a panic attack last night. States she still can't cough up her sputum. Abdominal Pain is improved. No nausea or vomiting. No CP. Thinks wound on Left leg is slightly worse. No other complaints or concerns.   Objective: Vitals:   03/18/17 0425 03/18/17 0531 03/18/17 0808 03/18/17 1707  BP:  (!) 84/40  (!) 119/56  Pulse:  95  100  Resp:  20  18  Temp:  98.1 F (36.7 C)  98.3 F (36.8 C)  TempSrc:  Oral  Oral  SpO2: 100% 100%  99% 99%  Weight:      Height:        Intake/Output Summary (Last 24 hours) at 03/18/17 1808 Last data filed at 03/18/17 0600  Gross per 24 hour  Intake              170 ml  Output             3000 ml  Net            -2830 ml   Filed Weights   03/17/17 1919 03/18/17 0042 03/18/17 0117  Weight: 106.9 kg (235 lb 10.8 oz) 103.9 kg (229 lb 0.9 oz) 103.9 kg (229 lb 0.9 oz)   Examination: Physical Exam:  Constitutional: Patient is a pleasant Obese Caucasian female in NAD appears calm and comfortable at bedside this AM but is slightly anxious Eyes: Sclerae anicteric. Conjunctivae noninjected ENMT: Patient has some hirsutisim. Very hard of hearing and have to yell in Right ear. Mucous membranes appear grossly intact Neck: Supple with no JVD Respiratory:  Bilaterally diminished with some expiratory wheezing. No rales or crackles. Patient was not tachypenic or using accessory muscles to breathe Cardiovascular: RRR S1, S2. No appreciable LE edema Abdomen: Soft, NT, Distended due to body habitus.  GU: Deferred Musculoskeletal: Has a Left BKA. No cyanosis Skin: Right Foot Heel ulcer. Left BKA stump wound dehiscence with purulence. No rashes or lesions appreciated. Neurologic: Extremely hard of hearing. Other CN appear grossly intact. Psychiatric: Anxious mood and affect. Intact judgement and insight.   Data Reviewed: I have personally reviewed following labs and imaging studies  CBC:  Recent Labs Lab 03/14/17 1750  03/15/17 2220 03/16/17 0507 03/17/17 0558 03/17/17 0717 03/17/17 1930 03/18/17 0319  WBC 10.1  < > 7.9 9.6 10.0  --  8.9 8.6  NEUTROABS 7.9*  --   --  6.8 6.8  --   --  6.5  HGB 12.8  < > 8.6* 10.0* 10.2* 10.5* 8.9* 8.7*  HCT 41.5  < > 28.7* 33.0* 33.7* 31.0* 29.0* 28.8*  MCV 102.5*  < > 101.8* 102.2* 101.8*  --  100.7* 102.9*  PLT 249  < > 132* 221 204  --  178 168  < > = values in this interval not displayed. Basic Metabolic Panel:  Recent Labs Lab 03/15/17 0751  03/15/17 2219 03/16/17 0507 03/17/17 0558 03/17/17 0717 03/17/17 1930 03/18/17 0319  NA  --  128* 131* 132* 133* 129* 132*  K  --  4.4 4.2 4.9 4.6 4.9 3.7  CL  --  93* 96* 96*  --  95* 97*  CO2  --  22 23 22   --  21* 20*  GLUCOSE  --  307*  190* 113* 99 206* 81  BUN  --  27* 28* 37*  --  39* 15  CREATININE  --  5.55* 5.87* 6.99*  --  7.25* 3.56*  CALCIUM  --  9.0 9.0 9.3  --  8.6* 8.5*  MG 1.8  --  2.2 2.3  --   --  2.0  PHOS 4.4 4.6 5.1* 5.4*  --  6.1* 2.8   GFR: Estimated Creatinine Clearance: 19.2 mL/min (A) (by C-G formula based on SCr of 3.56 mg/dL (H)). Liver Function Tests:  Recent Labs Lab 03/15/17 2219 03/16/17 0507 03/17/17 0558 03/17/17 1930 03/18/17 0319  AST  --  19 19  --  22  ALT  --  7* 8*  --  7*  ALKPHOS  --  146* 144*  --  124  BILITOT  --  0.6 0.7  --  0.9  PROT  --  7.7 8.1  --  7.3  ALBUMIN 2.3* 2.4* 2.4* 2.1* 2.9*   No results for input(s): LIPASE, AMYLASE in the last 168 hours. No results for input(s): AMMONIA in the last 168 hours. Coagulation Profile: No results for input(s): INR, PROTIME in the last 168 hours. Cardiac Enzymes:  Recent Labs Lab 03/14/17 1949 03/15/17 0220 03/15/17 0736  TROPONINI 0.21* 0.20* 0.24*   BNP (last 3 results) No results for input(s): PROBNP in the last 8760 hours. HbA1C: No results for input(s): HGBA1C in the last 72 hours. CBG:  Recent Labs Lab 03/18/17 0113 03/18/17 0336 03/18/17 0747 03/18/17 1216 03/18/17 1706  GLUCAP 104* 89 96 177* 319*   Lipid Profile: No results for input(s): CHOL, HDL, LDLCALC, TRIG, CHOLHDL, LDLDIRECT in the last 72 hours. Thyroid Function Tests: No results for input(s): TSH, T4TOTAL, FREET4, T3FREE, THYROIDAB in the last 72 hours. Anemia Panel: No results for input(s): VITAMINB12, FOLATE, FERRITIN, TIBC, IRON, RETICCTPCT in the last 72 hours. Sepsis Labs:  Recent Labs Lab 03/14/17 1925  LATICACIDVEN 1.82    Recent Results (from the past 240 hour(s))  Blood  culture (routine x 2)     Status: None (Preliminary result)   Collection Time: 03/14/17  9:38 PM  Result Value Ref Range Status   Specimen Description BLOOD LEFT ANTECUBITAL  Final   Special Requests IN PEDIATRIC BOTTLE Blood Culture adequate volume  Final   Culture NO GROWTH 4 DAYS  Final   Report Status PENDING  Incomplete  Blood culture (routine x 2)     Status: Abnormal   Collection Time: 03/14/17  9:49 PM  Result Value Ref Range Status   Specimen Description BLOOD BLOOD LEFT FOREARM  Final   Special Requests IN PEDIATRIC BOTTLE Blood Culture adequate volume  Final   Culture  Setup Time   Final    GRAM POSITIVE COCCI IN CLUSTERS IN PEDIATRIC BOTTLE CRITICAL RESULT CALLED TO, READ BACK BY AND VERIFIED WITH: J. LEDFORD, AT 1308 03/16/17 BY D. VANHOOK    Culture (A)  Final    STAPHYLOCOCCUS SPECIES (COAGULASE NEGATIVE) THE SIGNIFICANCE OF ISOLATING THIS ORGANISM FROM A SINGLE SET OF BLOOD CULTURES WHEN MULTIPLE SETS ARE DRAWN IS UNCERTAIN. PLEASE NOTIFY THE MICROBIOLOGY DEPARTMENT WITHIN ONE WEEK IF SPECIATION AND SENSITIVITIES ARE REQUIRED.    Report Status 03/17/2017 FINAL  Final  MRSA PCR Screening     Status: Abnormal   Collection Time: 03/15/17  9:41 AM  Result Value Ref Range Status   MRSA by PCR POSITIVE (A) NEGATIVE Final    Comment:        The  GeneXpert MRSA Assay (FDA approved for NASAL specimens only), is one component of a comprehensive MRSA colonization surveillance program. It is not intended to diagnose MRSA infection nor to guide or monitor treatment for MRSA infections. CRITICAL RESULT CALLED TO, READ BACK BY AND VERIFIED WITH: C. YAP, RN AT 1210 ON 03/15/17 BY C. JESSUP, MLT.      Radiology Studies: Dg Chest Port 1 View  Result Date: 03/18/2017 CLINICAL DATA:  Acute onset of respiratory distress. Initial encounter. EXAM: PORTABLE CHEST 1 VIEW COMPARISON:  Chest radiograph performed 03/16/2017 FINDINGS: The lungs are hypoexpanded. Bilateral central airspace  opacification, worse on the left, may reflect asymmetric pulmonary edema or possibly pneumonia. No definite pleural effusion or pneumothorax is seen. The cardiomediastinal silhouette is borderline enlarged. No acute osseous abnormalities are identified. IMPRESSION: Lungs hypoexpanded. Bilateral central airspace opacification, worse on the left, may reflect asymmetric pulmonary edema or possibly pneumonia. Borderline cardiomegaly. Electronically Signed   By: Roanna RaiderJeffery  Chang M.D.   On: 03/18/2017 06:09   Dg Femur Min 2 Views Left  Result Date: 03/16/2017 CLINICAL DATA:  Left femur pain.  Post left BKA 02/16/2017 EXAM: LEFT FEMUR 2 VIEWS COMPARISON:  None. FINDINGS: Cortical margins of the left femur are intact. No fracture or focal lesion. Hip and knee alignment are maintained. Below-the-knee amputation with skin staples in place. Soft tissue defect is noted medially about the surgical wound that does not extend to bone. Resection margins are smooth. IMPRESSION: 1. No acute abnormality or explanation for femur pain. 2. Below-the-knee amputation, resection margins are smooth. Skin staples in place. Skin defect in the surgical wound medially that does not extend to bone. Electronically Signed   By: Rubye OaksMelanie  Ehinger M.D.   On: 03/16/2017 20:04   Scheduled Meds: . aspirin EC  81 mg Oral Daily  . budesonide (PULMICORT) nebulizer solution  0.25 mg Nebulization BID  . Chlorhexidine Gluconate Cloth  6 each Topical Q0600  . [START ON 03/19/2017] darbepoetin (ARANESP) injection - DIALYSIS  100 mcg Intravenous Q Mon-HD  . doxercalciferol  1 mcg Intravenous Q M,W,F-HD  . heparin  5,000 Units Subcutaneous Q8H  . insulin aspart  0-9 Units Subcutaneous TID WC  . insulin glargine  28 Units Subcutaneous BID  . levothyroxine  88 mcg Oral QAC breakfast  . lidocaine  1 patch Transdermal Q24H  . mouth rinse  15 mL Mouth Rinse BID  . methylPREDNISolone (SOLU-MEDROL) injection  40 mg Intravenous Q12H  . midodrine  10 mg Oral  Daily  . multivitamin with minerals  1 tablet Oral Daily  . mupirocin ointment  1 application Nasal BID  . sevelamer carbonate  2.4 g Oral TID WC  . silver sulfADIAZINE  1 application Topical Daily  . sodium chloride HYPERTONIC  4 mL Nebulization Daily   Continuous Infusions: . sodium chloride Stopped (03/17/17 0945)  . sodium chloride    . ceFEPime (MAXIPIME) IV Stopped (03/18/17 0153)  . [START ON 03/19/2017] vancomycin      LOS: 4 days   Merlene Laughtermair Latif Shaquira Moroz, DO [facility] Pager [phone]  If 7PM-7AM, please contact night-coverage [email or web address] Password [email or web address] 03/18/2017, 6:08 PM

## 2017-03-18 NOTE — Progress Notes (Addendum)
HD tx completed @ 0015 w/o problem, UF goal met, blood rinsed back, report called to Fredrich Romans, RN

## 2017-03-18 NOTE — Progress Notes (Signed)
[facility] Progress Note  Subjective:  Having "panic attacks" overnight.  Extremely anxious this morning.  HD last night s/p declot. Tolerated ok Net UF 3L  Calmed when husband arrived to room "He's part of my anxiety treatment"   Objective Vitals:   03/18/17 0117 03/18/17 0425 03/18/17 0531 03/18/17 0808  BP: (!) 92/41  (!) 84/40   Pulse: 88  95   Resp: (!) 21  20   Temp: (!) 97.1 F (36.2 C)  98.1 F (36.7 C)   TempSrc: Oral  Oral   SpO2: 100% 100% 100% 99%  Weight: 103.9 kg (229 lb 0.9 oz)     Height:       Physical Exam General: obese chronically ill appearing WF, extremely HOH, wearing nasal oxygen  Regular S1S2 No S3 Lungs: diminished bilat Breathing unlabored.  Abdomen soft NT ND +bS Extremities: L BKA wrapped with clean dsg; R LE trace edema chronic venous changes  Dialysis Access: RUE AVG +strong bruit (post surgery)   Recent Labs Lab 03/17/17 0558 03/17/17 0717 03/17/17 1930 03/18/17 0319  NA 132* 133* 129* 132*  K 4.9 4.6 4.9 3.7  CL 96*  --  95* 97*  CO2 22  --  21* 20*  GLUCOSE 113* 99 206* 81  BUN 37*  --  39* 15  CREATININE 6.99*  --  7.25* 3.56*  CALCIUM 9.3  --  8.6* 8.5*  PHOS 5.4*  --  6.1* 2.8    Recent Labs Lab 03/16/17 0507 03/17/17 0558 03/17/17 1930 03/18/17 0319  AST 19 19  --  22  ALT 7* 8*  --  7*  ALKPHOS 146* 144*  --  124  BILITOT 0.6 0.7  --  0.9  PROT 7.7 8.1  --  7.3  ALBUMIN 2.4* 2.4* 2.1* 2.9*     Recent Labs Lab 03/15/17 2220 03/16/17 0507 03/17/17 0558 03/17/17 0717 03/17/17 1930 03/18/17 0319  WBC 7.9 9.6 10.0  --  8.9 8.6  NEUTROABS  --  6.8 6.8  --   --  6.5  HGB 8.6* 10.0* 10.2* 10.5* 8.9* 8.7*  HCT 28.7* 33.0* 33.7* 31.0* 29.0* 28.8*  MCV 101.8* 102.2* 101.8*  --  100.7* 102.9*  PLT 132* 221 204  --  178 168   Blood Culture    Component Value Date/Time   SDES BLOOD BLOOD LEFT FOREARM 03/14/2017 2149   SPECREQUEST IN PEDIATRIC BOTTLE Blood Culture adequate volume 03/14/2017  2149   CULT (A) 03/14/2017 2149    STAPHYLOCOCCUS SPECIES (COAGULASE NEGATIVE) THE SIGNIFICANCE OF ISOLATING THIS ORGANISM FROM A SINGLE SET OF BLOOD CULTURES WHEN MULTIPLE SETS ARE DRAWN IS UNCERTAIN. PLEASE NOTIFY THE MICROBIOLOGY DEPARTMENT WITHIN ONE WEEK IF SPECIATION AND SENSITIVITIES ARE REQUIRED.    REPTSTATUS 03/17/2017 FINAL 03/14/2017 2149     Recent Labs Lab 03/14/17 1949 03/15/17 0220 03/15/17 0736  TROPONINI 0.21* 0.20* 0.24*     Recent Labs Lab 03/17/17 1153 03/17/17 1652 03/18/17 0113 03/18/17 0336 03/18/17 0747  GLUCAP 123* 165* 104* 89 96    Medications: . sodium chloride Stopped (03/17/17 0945)  . sodium chloride    . ceFEPime (MAXIPIME) IV Stopped (03/18/17 0153)   . aspirin EC  81 mg Oral Daily  . budesonide (PULMICORT) nebulizer solution  0.25 mg Nebulization BID  . Chlorhexidine Gluconate Cloth  6 each Topical Q0600  . doxercalciferol  1 mcg Intravenous Q M,W,F-HD  . heparin  5,000 Units Subcutaneous Q8H  . heparin  6,000 Units Dialysis Once in  dialysis  . insulin aspart  0-9 Units Subcutaneous TID WC  . insulin glargine  28 Units Subcutaneous BID  . levothyroxine  88 mcg Oral QAC breakfast  . lidocaine  1 patch Transdermal Q24H  . mouth rinse  15 mL Mouth Rinse BID  . methylPREDNISolone (SOLU-MEDROL) injection  40 mg Intravenous Q12H  . midodrine  10 mg Oral Daily  . multivitamin with minerals  1 tablet Oral Daily  . mupirocin ointment  1 application Nasal BID  . oxyCODONE-acetaminophen      . sevelamer carbonate  2.4 g Oral TID WC  . silver sulfADIAZINE  1 application Topical Daily  . sodium chloride HYPERTONIC  4 mL Nebulization Daily   . sodium chloride Stopped (03/17/17 0945)  . sodium chloride    . ceFEPime (MAXIPIME) IV Stopped (03/18/17 0153)    Dg Chest Port 1 View  Result Date: 03/18/2017 CLINICAL DATA:  Acute onset of respiratory distress. Initial encounter. EXAM: PORTABLE CHEST 1 VIEW COMPARISON:  Chest radiograph performed  03/16/2017 FINDINGS: The lungs are hypoexpanded. Bilateral central airspace opacification, worse on the left, may reflect asymmetric pulmonary edema or possibly pneumonia. No definite pleural effusion or pneumothorax is seen. The cardiomediastinal silhouette is borderline enlarged. No acute osseous abnormalities are identified. IMPRESSION: Lungs hypoexpanded. Bilateral central airspace opacification, worse on the left, may reflect asymmetric pulmonary edema or possibly pneumonia. Borderline cardiomegaly. Electronically Signed   By: Roanna Raider M.D.   On: 03/18/2017 06:09   Dialysis Orders:  MWF  [facility]  4.5 hours  2 K 2 Ca  400/800  EDW 104  right upper AVGG  heparin 6000 load and 5000 mid tmt  venofer 100 through 7/11 hectorol 1 mircera 150 - last 6/7  Assessment/Plan: 1. ? HCAP. Vs exacerbation of COPD - seems more likely infectious - on IV Vanc/cefepime Repeat CXR this am with bilat airspace opacity worse on left side  2. Positive blood culture  -  BC 7/4 +Coag neg staph in one bottle - felt to be contaminant. Repeat BC pending.  3. ESRD -  MWF - HD tomorrow on schedule 4K bath K 3.7  4. Clotted AVG - s/p declot revision with Dr. Darrick Penna 7/7. Appreciate VVS assistance.  5. Hypertension/volume  - chronic low BP - midodrine for BP support - prone to high IDWG but intake much more restricted here. Net UF 3L with HD yesterday. Post HD wt 103.9kg by bed weight. UF as tolerated.  6. Anemia  - Hgb 10.5>8.7  - Will give Aranesp  100 with HD tomorrow.  Hold Fe course with infection  7. Metabolic bone disease -  Continue hectorol/binders 8. Nutrition - D1 diet with thin liquids- multivit; doesn't like oral supplments 9. Recent left BKA - Dr. Lajoyce Corners - with wound healing issues - seen by Dr. Lajoyce Corners recommending revision of BKA to AKA with progressive gangrenous changes. Possibly this admission.  10. Right heel ulcer - wound care RN following- 11. DNR 12. MRSA contact precautions 13. Hx of PAF   14. Hearing loss related to bilateral ear effusions - for myringotomy tubes in the near future (speak directly into right ear)  Tomasa Blase PA-C [facility] Pager [phone] 03/18/2017,9:46 AM   I have seen and examined this patient and agree with plan and assessment in the above note with renal recommendations/intervention highlighted. CXR bilateral air space opacities looking some worse this AM (despite volume off yesterday 3 liters with HD). On vanco/cefipime. For usual HD tomorrow. Unsure of  timing of BK revision - will have to resolve pulm issues first.  Shabreka Coulon B,MD 03/18/2017 12:21 PM

## 2017-03-19 ENCOUNTER — Encounter (HOSPITAL_COMMUNITY): Payer: Self-pay | Admitting: Vascular Surgery

## 2017-03-19 ENCOUNTER — Inpatient Hospital Stay (HOSPITAL_COMMUNITY): Payer: Medicare Other

## 2017-03-19 DIAGNOSIS — T829XXD Unspecified complication of cardiac and vascular prosthetic device, implant and graft, subsequent encounter: Secondary | ICD-10-CM

## 2017-03-19 LAB — CULTURE, BLOOD (ROUTINE X 2)
Culture: NO GROWTH
SPECIAL REQUESTS: ADEQUATE

## 2017-03-19 LAB — GLUCOSE, CAPILLARY
GLUCOSE-CAPILLARY: 323 mg/dL — AB (ref 65–99)
Glucose-Capillary: 430 mg/dL — ABNORMAL HIGH (ref 65–99)
Glucose-Capillary: 438 mg/dL — ABNORMAL HIGH (ref 65–99)
Glucose-Capillary: 212 mg/dL — ABNORMAL HIGH (ref 65–99)
Glucose-Capillary: 479 mg/dL — ABNORMAL HIGH (ref 65–99)

## 2017-03-19 LAB — COMPREHENSIVE METABOLIC PANEL
ALT: 10 U/L — AB (ref 14–54)
AST: 24 U/L (ref 15–41)
Albumin: 3 g/dL — ABNORMAL LOW (ref 3.5–5.0)
Alkaline Phosphatase: 136 U/L — ABNORMAL HIGH (ref 38–126)
Anion gap: 17 — ABNORMAL HIGH (ref 5–15)
BILIRUBIN TOTAL: 0.9 mg/dL (ref 0.3–1.2)
BUN: 34 mg/dL — AB (ref 6–20)
CO2: 18 mmol/L — ABNORMAL LOW (ref 22–32)
CREATININE: 5.26 mg/dL — AB (ref 0.44–1.00)
Calcium: 9.8 mg/dL (ref 8.9–10.3)
Chloride: 95 mmol/L — ABNORMAL LOW (ref 101–111)
GFR calc Af Amer: 9 mL/min — ABNORMAL LOW (ref >60)
GFR, EST NON AFRICAN AMERICAN: 8 mL/min — AB (ref >60)
Glucose, Bld: 404 mg/dL — ABNORMAL HIGH (ref 65–99)
POTASSIUM: 5.2 mmol/L — AB (ref 3.5–5.1)
Sodium: 130 mmol/L — ABNORMAL LOW (ref 135–145)
TOTAL PROTEIN: 8.1 g/dL (ref 6.5–8.1)

## 2017-03-19 LAB — CBC WITH DIFFERENTIAL/PLATELET
BASOS PCT: 0 %
Basophils Absolute: 0 10*3/uL (ref 0.0–0.1)
EOS PCT: 0 %
Eosinophils Absolute: 0 10*3/uL (ref 0.0–0.7)
HEMATOCRIT: 33 % — AB (ref 36.0–46.0)
Hemoglobin: 10.2 g/dL — ABNORMAL LOW (ref 12.0–15.0)
LYMPHS ABS: 0.4 10*3/uL — AB (ref 0.7–4.0)
Lymphocytes Relative: 4 %
MCH: 32.1 pg (ref 26.0–34.0)
MCHC: 30.9 g/dL (ref 30.0–36.0)
MCV: 103.8 fL — AB (ref 78.0–100.0)
MONOS PCT: 4 %
Monocytes Absolute: 0.4 10*3/uL (ref 0.1–1.0)
Neutro Abs: 9.5 10*3/uL — ABNORMAL HIGH (ref 1.7–7.7)
Neutrophils Relative %: 92 %
Platelets: 203 10*3/uL (ref 150–400)
RBC: 3.18 MIL/uL — AB (ref 3.87–5.11)
RDW: 18.2 % — AB (ref 11.5–15.5)
WBC: 10.3 10*3/uL (ref 4.0–10.5)

## 2017-03-19 LAB — PHOSPHORUS: Phosphorus: 5.1 mg/dL — ABNORMAL HIGH (ref 2.5–4.6)

## 2017-03-19 LAB — MAGNESIUM: MAGNESIUM: 2.4 mg/dL (ref 1.7–2.4)

## 2017-03-19 LAB — GLUCOSE, RANDOM: Glucose, Bld: 372 mg/dL — ABNORMAL HIGH (ref 65–99)

## 2017-03-19 MED ORDER — MIDODRINE HCL 5 MG PO TABS
ORAL_TABLET | ORAL | Status: AC
  Filled 2017-03-19: qty 2

## 2017-03-19 MED ORDER — INSULIN ASPART 100 UNIT/ML ~~LOC~~ SOLN
0.0000 [IU] | Freq: Three times a day (TID) | SUBCUTANEOUS | Status: DC
  Administered 2017-03-19: 7 [IU] via SUBCUTANEOUS
  Administered 2017-03-19: 15 [IU] via SUBCUTANEOUS
  Administered 2017-03-20: 20 [IU] via SUBCUTANEOUS
  Administered 2017-03-20: 15 [IU] via SUBCUTANEOUS
  Administered 2017-03-21: 11 [IU] via SUBCUTANEOUS
  Administered 2017-03-21: 3 [IU] via SUBCUTANEOUS
  Administered 2017-03-22: 4 [IU] via SUBCUTANEOUS
  Administered 2017-03-22: 20 [IU] via SUBCUTANEOUS
  Administered 2017-03-22: 11 [IU] via SUBCUTANEOUS

## 2017-03-19 MED ORDER — DEXTROSE 5 % IV SOLN
2.0000 g | INTRAVENOUS | Status: DC
  Administered 2017-03-19: 2 g via INTRAVENOUS
  Filled 2017-03-19 (×2): qty 2

## 2017-03-19 MED ORDER — VANCOMYCIN HCL IN DEXTROSE 1-5 GM/200ML-% IV SOLN
INTRAVENOUS | Status: AC
  Filled 2017-03-19: qty 200

## 2017-03-19 MED ORDER — SODIUM CHLORIDE 0.9 % IV SOLN: 100.0000 mL | INTRAVENOUS | Status: DC | PRN

## 2017-03-19 MED ORDER — HEPARIN SODIUM (PORCINE) 1000 UNIT/ML DIALYSIS: 1000.0000 [IU] | INTRAMUSCULAR | Status: DC | PRN

## 2017-03-19 MED ORDER — LORAZEPAM 0.5 MG PO TABS
ORAL_TABLET | ORAL | Status: AC
  Filled 2017-03-19: qty 1

## 2017-03-19 MED ORDER — INSULIN GLARGINE 100 UNIT/ML ~~LOC~~ SOLN
32.0000 [IU] | Freq: Two times a day (BID) | SUBCUTANEOUS | Status: DC
  Administered 2017-03-19 – 2017-03-23 (×8): 32 [IU] via SUBCUTANEOUS
  Filled 2017-03-19 (×9): qty 0.32

## 2017-03-19 MED ORDER — HEPARIN SODIUM (PORCINE) 1000 UNIT/ML DIALYSIS: 20.0000 [IU]/kg | INTRAMUSCULAR | Status: DC | PRN

## 2017-03-19 MED ORDER — TRAMADOL HCL 50 MG PO TABS
ORAL_TABLET | ORAL | Status: AC
  Filled 2017-03-19: qty 1

## 2017-03-19 MED ORDER — INSULIN ASPART 100 UNIT/ML ~~LOC~~ SOLN
0.0000 [IU] | Freq: Every day | SUBCUTANEOUS | Status: DC
  Administered 2017-03-19: 5 [IU] via SUBCUTANEOUS
  Administered 2017-03-20: 4 [IU] via SUBCUTANEOUS
  Administered 2017-03-21 – 2017-03-22 (×2): 5 [IU] via SUBCUTANEOUS

## 2017-03-19 MED ORDER — DARBEPOETIN ALFA 60 MCG/0.3ML IJ SOSY
60.0000 ug | PREFILLED_SYRINGE | INTRAMUSCULAR | Status: DC
  Administered 2017-03-19: 60 ug via INTRAVENOUS
  Filled 2017-03-19: qty 0.3

## 2017-03-19 MED ORDER — DARBEPOETIN ALFA 60 MCG/0.3ML IJ SOSY
PREFILLED_SYRINGE | INTRAMUSCULAR | Status: AC
  Filled 2017-03-19: qty 0.3

## 2017-03-19 MED ORDER — DOXERCALCIFEROL 4 MCG/2ML IV SOLN
INTRAVENOUS | Status: AC
  Filled 2017-03-19: qty 2

## 2017-03-19 MED ORDER — INSULIN ASPART 100 UNIT/ML ~~LOC~~ SOLN
9.0000 [IU] | Freq: Once | SUBCUTANEOUS | Status: AC
  Administered 2017-03-19: 9 [IU] via SUBCUTANEOUS

## 2017-03-19 NOTE — Progress Notes (Signed)
Pharmacy Antibiotic Note  Kimberly [name] is a 61 y.o. female admitted on 03/14/2017 with pneumonia. Pharmacy has been consulted for vancomycin dosing for 8 days total. She is also on cefepime.  ESRD on HD- was off schedule d/t access being clotted. She did not miss doses of cefepime or vancomycin.  Plan: Vancomycin 1g IV qHD-MWF Cefepime 2g IV qMWF @ 2000 Last doses due 7/11 for 8 total days of therapy (stop dates entered) Follow changes to treatment plan, HD schedule/tolerance, c/s  Height: 5\' 4"  (162.6 cm) Weight: 229 lb 4.5 oz (104 kg) IBW/kg (Calculated) : 54.7  Temp (24hrs), Avg:98.3 F (36.8 C), Min:98 F (36.7 C), Max:98.7 F (37.1 C)   Recent Labs Lab 03/14/17 1925  03/16/17 0507 03/17/17 0558 03/17/17 1930 03/18/17 0319 03/19/17 0539  WBC  --   < > 9.6 10.0 8.9 8.6 10.3  CREATININE  --   < > 5.87* 6.99* 7.25* 3.56* 5.26*  LATICACIDVEN 1.82  --   --   --   --   --   --   < > = values in this interval not displayed.  Estimated Creatinine Clearance: 13 mL/min (A) (by C-G formula based on SCr of 5.26 mg/dL (H)).    Allergies  Allergen Reactions  . Ciprofloxacin Itching  . Epinephrine Palpitations    Antimicrobials this admission: cefepime 7/4 >> (7/11) vancomycin 7/4 >> (7/11)  Dose adjustments this admission: 7/4: Vancomycin 2000mg  x1 7/9: cefepime 2g IV qMWF  Microbiology results: 7/4 Blood>>gram + cocci clusters, BCID invalid->contaminant 7/5 MRSA PCR: pos 7/8 BCx: sent  Thank you for allowing pharmacy to be a part of this patient's care.  Constantino Starace D. Garin Mata, PharmD, BCPS Clinical Pharmacist Pager: [phone] Clinical Phone for 03/19/2017 until 3:30pm: [phone] If after 3:30pm, please call main pharmacy at [phone] 03/19/2017 11:08 AM

## 2017-03-19 NOTE — Care Management Important Message (Signed)
Important Message  Patient Details  Name: Galen ManilaSusan K Lawlor MRN: [id number] Date of Birth: 03-29-1956   Medicare Important Message Given:  Yes    Sarah Baez, [name], RN 03/19/2017, 10:17 AM

## 2017-03-19 NOTE — Progress Notes (Signed)
PROGRESS NOTE    Kimberly [name]  ZOX:[id number] DOB: 24-Aug-1956 DOA: 03/14/2017 PCP: Merlene Laughter, MD  Brief Narrative:  Kimberly [name] is a 61 y.o. female with history of ESRD on hemodialysis on Monday Wednesday Friday who started developing sharp shooting chest pain while during dialysis. Pain was nonradiating retrosternal and had some productive cough. Patient states over the last 3 weeks patient has been having some wheezing and coughing. Patient also has been having nasal drainage for which patient was prescribed nasal spray and antibiotics by patient's ENT surgeon. Patient was recently admitted for left foot infection and had undergone Left BKA. Patient also has developed new ulcers on the right foot. In the ER patient is found to be wheezing with chest x-ray showing possible infiltrates. Patient also has productive cough. Patient's clinical picture is compatible with pneumonia and was started on antibiotics. Patient has not had any chest pain the ER. EKG was showing LBBB and troponin was negative. She states she is still wheezing today. Overnight Blood Cx Grew Gram Positive Cocci in 1/2 Cultures. Her AVG also clotted off later yesterday afternoon so Vascular Surgery was consulted and patient underwent thrombectomy and graft revision by Vascular Surgeon Dr. Fabienne Bruns. She underwent dialysis again yesterday. Overnight on 7/8 she had a panic attack and had anxiety and was calmed by her husband. This AM she was feeling better and thinks that she is breathing better.   Assessment & Plan:   Principal Problem:   HCAP (healthcare-associated pneumonia) Active Problems:   CAD (coronary artery disease), native coronary artery   COPD (chronic obstructive pulmonary disease) (HCC)   Hypothyroidism   Paroxysmal atrial fibrillation (HCC)   Diabetes mellitus with complication (HCC)   Pneumonia   Dehiscence of amputation stump (HCC)  Healthcare associated pneumonia   -Patient is placed on IV  Vancomycin and Cefepime and will de-escalate in AM -Follow cultures. 1/2 Blood Cx + and liklely was a contaminant; Repeat Cx Showed NGTD at 1 day so will D/C IV Vancomycin  -MRSA by PCR was Positive -Check Legionella Pneumophila Serogp 1 Ur Ag and Strep Pnemoniae Urine Antigen pending  -C/w DuoNeb 4 times a day and q2hprn -Added Mucinex -C/w Flutter Valve and Incentive Spirometry -Adde Hypertonic Saline Nebs x 3 days -If not improving may need Pulmonary Consult  -C/w Pulmicort 0.25 mg Neb BID -Started IV Steroids 40 mg q12h -Repeat CXR 03/18/17 showed Lungs hypoexpanded. Bilateral central airspace opacification, worse on the left, may reflect asymmetric pulmonary edema or possibly pneumonia. Borderline cardiomegaly. -Repeat CXR 03/19/17 showed Improving pulmonary edema/interstitial opacities. No confluent consolidation.  COPD exacerbation  -On exam patient has bilateral expiratory wheezes -As Above -Started IV Steroids 40 mg q12h  -C/w Supplemental O2  -Abx as above  Gram Positive Staphylococcus Species Bacteremia -1/2 showed Growth on 03/14/17 -?Source but suspect a contaminant  -On IV Vanc/Cefepime as above but will likely De-escalate and stop Vancomycin now that Repeat Cx are NGTD at 1 day.  Chest pain  -Appears atypical.  -Cycled cardiac markers and Tropoin relatively flat at 0.21 -> 0.20 -> 0.24;  -Markers likely elevated due to ESRD -Continue to Montior   Left leg wound status post recent transtibial amputation also has a new one on the right leg on the Right Heel  -There are patchy areas of black eschar scattered across the location; largest is to the left outer wound; 2X2cm.  No fluctuance, odor or drainage from the eschar. -WOC Nurse Consult done and Recommended consulting Dr. Lajoyce Corners  to Assess Left Stump Wound -Full thickness wound in the middle of the staple line; 2.5X2.5X2cm, dark red wound bed, mod amt green-tinged drainage, slight odor. And WOC nurse recommended Dressing  procedure/placement/frequency:  Float right heel to reduce pressure.  Foam dressing to protect from further injury.  Pt states she is aware that deep tissue injuries are high risk to evolve into full thickness tissue loss. -Discussed with Dr. Lajoyce Corners who recommended getting an X-Ray which showed No acute abnormality or explanation for femur pain. Below-the-knee amputation, resection margins are smooth. Skin staples in place. Skin defect in the surgical wound medially that does not extend to bone -Dr. Lajoyce Corners recommending continuing dressing changes per wound ostomy nursing and states patient will need a revision to an AKA and will proceed once medically stable during this hospitalization or done as an outpatient; *Discussed with Dr. Lajoyce Corners today over the phone and recommends that she will likely have it done as an outpatient.   Diabetes Mellitus Type 2  -On Lantus 28 units BID and will increase to 32 units BID and increased Sensitive Novolog Sliding Scale Coverage AC to Resistant Novolog SSI AC/HS -CBG's ranging from 212-478 -Diabetes Education Coordinator consulted and appreciated recommendations   Hypothyroidism -C/w Synthroid 88 mcg  Anemia probably from renal disease  -Hb/Hct went from 10.5/34.7 -> 10.3/35.5 -> 8.6/28.7 -> 10.0/33.0 -> 10.2/33.7 -> 10.5/31.5 -> 8.9/29.0 -> 8.7/28.8 -> 10.2/33.0 -Monitor for S/Sx of Bleeding -Continue to Follow CBC's  History of paroxysmal Atrial Fibrillation  -Patient had refused anticoagulation previously. -Continue to Monitor on Telemetry   ESRD on Hemodialysis - Monday Wednesday and Friday.  -Consulted Nephrology for Maintenance of Hemodialysis. -C/w Sevelamer Carbonate 2.4 g po TID and with Doxercalciferol 1 mcg IV MWF -Ferric Gluconate 125 mg IV MWF held due to infection -Patient was supposed to go for HD yesterday but AVG clotted; IR unable to declot so VVS Dr. Darrick Penna was consulted -Dr. Darrick Penna did thrombectomy and graft revision 03/17/17 and Graft was ok  to use -Per Nephro receiving Dialysis today   DVT prophylaxis: Heparin 5,000 units sq q8h Code Status: DO NOT RESUSCITATE Family Communication: Husband at bedside Disposition Plan: Remain Inpatient and anticipate D/C in 24-48 hours if improving.  Consultants:   Nephrology Dr. Aram Beecham Dunham/Dr. Delano Metz  Ortho Dr. Aldean Baker  Vascular Surgery Dr. Fabienne Bruns   Procedures: Hemodialysis    Antimicrobials:  Anti-infectives    Start     Dose/Rate Route Frequency Ordered Stop   03/19/17 2000  ceFEPIme (MAXIPIME) 2 g in dextrose 5 % 50 mL IVPB     2 g 100 mL/hr over 30 Minutes Intravenous Every M-W-F (2000) 03/19/17 1107 03/23/17 1959   03/19/17 1200  vancomycin (VANCOCIN) IVPB 1000 mg/200 mL premix     1,000 mg 200 mL/hr over 60 Minutes Intravenous Every M-W-F (Hemodialysis) 03/18/17 1128 03/21/17 2359   03/19/17 0857  vancomycin (VANCOCIN) 1-5 GM/200ML-% IVPB    Comments:  Kimberly [name]   : cabinet override      03/19/17 0857 03/19/17 2059   03/17/17 1900  vancomycin (VANCOCIN) IVPB 1000 mg/200 mL premix     1,000 mg 200 mL/hr over 60 Minutes Intravenous Every T-Th-Sa (Hemodialysis) 03/17/17 1829 03/18/17 0015   03/17/17 0730  cefUROXime (ZINACEF) 1.5 g in dextrose 5 % 50 mL IVPB     1.5 g 100 mL/hr over 30 Minutes Intravenous  Once 03/17/17 0722 03/17/17 0805   03/17/17 0723  dextrose 5 % with cefUROXime (ZINACEF) ADS Med  Comments:  Shireen Quan   : cabinet override      03/17/17 0723 03/17/17 0750   03/16/17 1200  vancomycin (VANCOCIN) IVPB 1000 mg/200 mL premix  Status:  Discontinued     1,000 mg 200 mL/hr over 60 Minutes Intravenous Every M-W-F (Hemodialysis) 03/15/17 0851 03/16/17 1411   03/15/17 2000  ceFEPIme (MAXIPIME) 1 g in dextrose 5 % 50 mL IVPB  Status:  Discontinued     1 g 100 mL/hr over 30 Minutes Intravenous Every 24 hours 03/14/17 1952 03/19/17 1107   03/14/17 2030  vancomycin (VANCOCIN) IVPB 1000 mg/200 mL premix     1,000 mg 200 mL/hr over  60 Minutes Intravenous  Once 03/14/17 1943 03/15/17 0019   03/14/17 1915  ceFEPIme (MAXIPIME) 2 g in dextrose 5 % 50 mL IVPB     2 g 100 mL/hr over 30 Minutes Intravenous  Once 03/14/17 1902 03/14/17 2013     Subjective: Seen and examined at bedside and stated that the flutter valve is helping and that she is able to expectorate clear sputum. No nausea or vomiting.   Objective: Vitals:   03/19/17 1100 03/19/17 1143 03/19/17 1224 03/19/17 1802  BP: (!) 90/35 (!) 101/49 (!) 119/51 (!) 117/43  Pulse: 94 96 99 99  Resp:  18 18 18   Temp:  98 F (36.7 C) 98.6 F (37 C) 98.5 F (36.9 C)  TempSrc:  Oral Oral Oral  SpO2:  98% 100% 100%  Weight:  102.5 kg (225 lb 15.5 oz)    Height:        Intake/Output Summary (Last 24 hours) at 03/19/17 2013 Last data filed at 03/19/17 1929  Gross per 24 hour  Intake              950 ml  Output             1500 ml  Net             -550 ml   Filed Weights   03/18/17 2104 03/19/17 0713 03/19/17 1143  Weight: 103 kg (227 lb 1.2 oz) 104 kg (229 lb 4.5 oz) 102.5 kg (225 lb 15.5 oz)   Examination: Physical Exam:  Constitutional: Pleasant obese female in NAD getting dialysis. Appears calm and comfortable.  Eyes: Lids normal. Sclerae anicteric.  ENMT: Extremely hard of hearing and have to yell in Right Ear. Has some hirsutism. Mucous membranes are moist. Neck: Supple with no JVD.  Respiratory:  Diminished bilaterally with mild crackles and minimal wheezing. Patient was not tachypenic or using any accessory muscles to breathe.  Cardiovascular: RRR; S1 S2. No appreciable lower extremity edema Abdomen: Soft, NT, Distended due to body habitus. Bowel Sounds present. GU: Deferred Musculoskeletal: Left Leg BKA. No contractures. No cyanosis Skin: Patient with Right Heel ulcer. Left Leg BKA with wound dehiscence. Has Right Arm AVG Neurologic: Patient is essentially deaf. Other CN 2-12 grossly intact. No focal deficits Psychiatric: Normal mood and affect.  Intact judgement and insight.  Data Reviewed: I have personally reviewed following labs and imaging studies  CBC:  Recent Labs Lab 03/14/17 1750  03/16/17 0507 03/17/17 0558 03/17/17 0717 03/17/17 1930 03/18/17 0319 03/19/17 0539  WBC 10.1  < > 9.6 10.0  --  8.9 8.6 10.3  NEUTROABS 7.9*  --  6.8 6.8  --   --  6.5 9.5*  HGB 12.8  < > 10.0* 10.2* 10.5* 8.9* 8.7* 10.2*  HCT 41.5  < > 33.0* 33.7* 31.0* 29.0* 28.8* 33.0*  MCV 102.5*  < > 102.2* 101.8*  --  100.7* 102.9* 103.8*  PLT 249  < > 221 204  --  178 168 203  < > = values in this interval not displayed. Basic Metabolic Panel:  Recent Labs Lab 03/15/17 0751  03/16/17 0507 03/17/17 [id number] 03/17/17 [id number] 03/17/17 1930 03/18/17 0319 03/19/17 0539 03/19/17 0815  NA  --   < > 131* 132* 133* 129* 132* 130*  --   K  --   < > 4.2 4.9 4.6 4.9 3.7 5.2*  --   CL  --   < > 96* 96*  --  95* 97* 95*  --   CO2  --   < > 23 22  --  21* 20* 18*  --   GLUCOSE  --   < > 190* 113* 99 206* 81 404* 372*  BUN  --   < > 28* 37*  --  39* 15 34*  --   CREATININE  --   < > 5.87* 6.99*  --  7.25* 3.56* 5.26*  --   CALCIUM  --   < > 9.0 9.3  --  8.6* 8.5* 9.8  --   MG 1.8  --  2.2 2.3  --   --  2.0 2.4  --   PHOS 4.4  < > 5.1* 5.4*  --  6.1* 2.8 5.1*  --   < > = values in this interval not displayed. GFR: Estimated Creatinine Clearance: 12.9 mL/min (A) (by C-G formula based on SCr of 5.26 mg/dL (H)). Liver Function Tests:  Recent Labs Lab 03/16/17 0507 03/17/17 0558 03/17/17 1930 03/18/17 0319 03/19/17 0539  AST 19 19  --  22 24  ALT 7* 8*  --  7* 10*  ALKPHOS 146* 144*  --  124 136*  BILITOT 0.6 0.7  --  0.9 0.9  PROT 7.7 8.1  --  7.3 8.1  ALBUMIN 2.4* 2.4* 2.1* 2.9* 3.0*   No results for input(s): LIPASE, AMYLASE in the last 168 hours. No results for input(s): AMMONIA in the last 168 hours. Coagulation Profile: No results for input(s): INR, PROTIME in the last 168 hours. Cardiac Enzymes:  Recent Labs Lab 03/14/17 1949  03/15/17 0220 03/15/17 0736  TROPONINI 0.21* 0.20* 0.24*   BNP (last 3 results) No results for input(s): PROBNP in the last 8760 hours. HbA1C: No results for input(s): HGBA1C in the last 72 hours. CBG:  Recent Labs Lab 03/18/17 2048 03/19/17 0203 03/19/17 0328 03/19/17 1212 03/19/17 1644  GLUCAP 478* 430* 438* 212* 323*   Lipid Profile: No results for input(s): CHOL, HDL, LDLCALC, TRIG, CHOLHDL, LDLDIRECT in the last 72 hours. Thyroid Function Tests: No results for input(s): TSH, T4TOTAL, FREET4, T3FREE, THYROIDAB in the last 72 hours. Anemia Panel: No results for input(s): VITAMINB12, FOLATE, FERRITIN, TIBC, IRON, RETICCTPCT in the last 72 hours. Sepsis Labs:  Recent Labs Lab 03/14/17 1925  LATICACIDVEN 1.82    Recent Results (from the past 240 hour(s))  Blood culture (routine x 2)     Status: None   Collection Time: 03/14/17  9:38 PM  Result Value Ref Range Status   Specimen Description BLOOD LEFT ANTECUBITAL  Final   Special Requests IN PEDIATRIC BOTTLE Blood Culture adequate volume  Final   Culture NO GROWTH 5 DAYS  Final   Report Status 03/19/2017 FINAL  Final  Blood culture (routine x 2)     Status: Abnormal   Collection Time: 03/14/17  9:49 PM  Result Value Ref Range Status   Specimen Description BLOOD BLOOD LEFT FOREARM  Final   Special Requests IN PEDIATRIC BOTTLE Blood Culture adequate volume  Final   Culture  Setup Time   Final    GRAM POSITIVE COCCI IN CLUSTERS IN PEDIATRIC BOTTLE CRITICAL RESULT CALLED TO, READ BACK BY AND VERIFIED WITH: J. LEDFORD, AT 2956 03/16/17 BY D. VANHOOK    Culture (A)  Final    STAPHYLOCOCCUS SPECIES (COAGULASE NEGATIVE) THE SIGNIFICANCE OF ISOLATING THIS ORGANISM FROM A SINGLE SET OF BLOOD CULTURES WHEN MULTIPLE SETS ARE DRAWN IS UNCERTAIN. PLEASE NOTIFY THE MICROBIOLOGY DEPARTMENT WITHIN ONE WEEK IF SPECIATION AND SENSITIVITIES ARE REQUIRED.    Report Status 03/17/2017 FINAL  Final  MRSA PCR Screening     Status:  Abnormal   Collection Time: 03/15/17  9:41 AM  Result Value Ref Range Status   MRSA by PCR POSITIVE (A) NEGATIVE Final    Comment:        The GeneXpert MRSA Assay (FDA approved for NASAL specimens only), is one component of a comprehensive MRSA colonization surveillance program. It is not intended to diagnose MRSA infection nor to guide or monitor treatment for MRSA infections. CRITICAL RESULT CALLED TO, READ BACK BY AND VERIFIED WITH: C. YAP, RN AT 1210 ON 03/15/17 BY C. JESSUP, MLT.   Culture, blood (Routine X 2) w Reflex to ID Panel     Status: None (Preliminary result)   Collection Time: 03/18/17  3:19 AM  Result Value Ref Range Status   Specimen Description BLOOD LEFT HAND  Final   Special Requests IN PEDIATRIC BOTTLE Blood Culture adequate volume  Final   Culture NO GROWTH 1 DAY  Final   Report Status PENDING  Incomplete  Culture, blood (Routine X 2) w Reflex to ID Panel     Status: None (Preliminary result)   Collection Time: 03/18/17  3:26 AM  Result Value Ref Range Status   Specimen Description BLOOD LEFT HAND  Final   Special Requests IN PEDIATRIC BOTTLE Blood Culture adequate volume  Final   Culture NO GROWTH 1 DAY  Final   Report Status PENDING  Incomplete     Radiology Studies: Dg Chest 2 View  Result Date: 03/19/2017 CLINICAL DATA:  Pneumonia. Persisting cough and shortness of breath. EXAM: CHEST  2 VIEW COMPARISON:  Radiographs 03/18/2017, additional priors. FINDINGS: Borderline mild cardiomegaly, unchanged mediastinal contours. Improving pulmonary edema/interstitial opacities. No consolidation to suggest pneumonia, particularly at site previously questioned in the left lung base. No pleural effusion. No pneumothorax. Stable osseous structures. IMPRESSION: Improving pulmonary edema/interstitial opacities. No confluent consolidation. Electronically Signed   By: Rubye Oaks M.D.   On: 03/19/2017 16:30   Dg Chest Port 1 View  Result Date: 03/18/2017 CLINICAL DATA:   Acute onset of respiratory distress. Initial encounter. EXAM: PORTABLE CHEST 1 VIEW COMPARISON:  Chest radiograph performed 03/16/2017 FINDINGS: The lungs are hypoexpanded. Bilateral central airspace opacification, worse on the left, may reflect asymmetric pulmonary edema or possibly pneumonia. No definite pleural effusion or pneumothorax is seen. The cardiomediastinal silhouette is borderline enlarged. No acute osseous abnormalities are identified. IMPRESSION: Lungs hypoexpanded. Bilateral central airspace opacification, worse on the left, may reflect asymmetric pulmonary edema or possibly pneumonia. Borderline cardiomegaly. Electronically Signed   By: Roanna Raider M.D.   On: 03/18/2017 06:09   Scheduled Meds: . aspirin EC  81 mg Oral Daily  . budesonide (PULMICORT) nebulizer solution  0.25 mg Nebulization BID  . Chlorhexidine Gluconate Cloth  6 each  Topical Q0600  . darbepoetin (ARANESP) injection - DIALYSIS  60 mcg Intravenous Q Mon-HD  . doxercalciferol  1 mcg Intravenous Q M,W,F-HD  . heparin  5,000 Units Subcutaneous Q8H  . insulin aspart  0-20 Units Subcutaneous TID WC  . insulin aspart  0-5 Units Subcutaneous QHS  . insulin glargine  28 Units Subcutaneous BID  . levothyroxine  88 mcg Oral QAC breakfast  . lidocaine  1 patch Transdermal Q24H  . mouth rinse  15 mL Mouth Rinse BID  . methylPREDNISolone (SOLU-MEDROL) injection  40 mg Intravenous Q12H  . midodrine  10 mg Oral Daily  . multivitamin with minerals  1 tablet Oral Daily  . mupirocin ointment  1 application Nasal BID  . sevelamer carbonate  2.4 g Oral TID WC  . silver sulfADIAZINE  1 application Topical Daily  . sodium chloride HYPERTONIC  4 mL Nebulization Daily   Continuous Infusions: . sodium chloride Stopped (03/17/17 0945)  . sodium chloride    . ceFEPime (MAXIPIME) IV    . vancomycin    . vancomycin Stopped (03/19/17 1118)    LOS: 5 days   Merlene Laughter, DO [facility] Pager [phone]  If  7PM-7AM, please contact night-coverage [email or web address] Password [email or web address] 03/19/2017, 8:13 PM

## 2017-03-19 NOTE — Progress Notes (Signed)
Inpatient Diabetes Program Recommendations  AACE/ADA: New Consensus Statement on Inpatient Glycemic Control (2015)  Target Ranges:  Prepandial:   less than 140 mg/dL      Peak postprandial:   less than 180 mg/dL (1-2 hours)      Critically ill patients:  140 - 180 mg/dL  Results for [name], Lesli K (MRN [id number]) as of 03/19/2017 09:16  Ref. Range 03/18/2017 07:47 03/18/2017 12:16 03/18/2017 17:06 03/18/2017 20:48 03/19/2017 02:03 03/19/2017 03:28  Glucose-Capillary Latest Ref Range: 65 - 99 mg/dL 96 829177 (H) 562319 (H) 130478 (H) 430 (H) 438 (H)    Review of Glycemic Control  Diabetes history: DM2 Outpatient Diabetes medications: Lantus 28 units BID, Novolog 0-9 units TID Current orders for Inpatient glycemic control: Lantus 28 units BID, Novolog 0-20 units TID with meals, Novolog 0-5 units QHS  Inpatient Diabetes Program Recommendations: Insulin - Basal: If steroids are continued, please consider increasing Lantus to 32 units BID. Correction (SSI): Noted correction scale was increased to resistant scale this morning. Insulin - Meal Coverage: If steroids are continued, please consider ordering Novolog 5 units TID with meals for meal coverage (in addition to Novolog correction scale).  NOTE: Ordered Solumedrol 40 mg Q12H which is contributing to hyperglycemia.   Thanks, Orlando PennerMarie Leandrea Ackley, RN, MSN, CDE Diabetes Coordinator Inpatient Diabetes Program [phone] (Team Pager from 8am to 5pm)

## 2017-03-19 NOTE — Progress Notes (Signed)
[facility] Progress Note   Dialysis Orders: MWF [facility] 4.5 hours 2 K 2 Ca 400/800 EDW 104 right upper AVGG heparin 6000 load and 5000 mid tmt  venofer 100 through 7/11 hectorol 1 mircera 150 - last 6/7  Assessment/Plan: 1. ? HCAP plus exacerbation of COPD - seems more likely infectious - on IV Vanc/cefepime Repeat CXR 7/9 with bilat airspace opacity worse on left side - asymmetric edema vs PNA - she feels better 2. Positive blood culture  -  BC 7/4 +Coag neg staph in one bottle - felt to be contaminant. Repeat BC 7/8 pending 3. ESRD - MWF -K 5.2 up from 3.7 yesterday but associated ^ glu 404- change to 2 K bath for the remainder of treatment 4. Clotted AVG - s/p declot revision with Dr. Darrick PennaFields 7/7. Appreciate VVS assistance.  5. BP/volume - chronic low BP - midodrine for BP support - prone to high IDWG but intake much more restricted here. Net UF 3L Post HD wt 103.9kg  Goal today 1.5 - BP in 80s- breathing easily  6. Anemia - Hgb 10.5>8.7( pre HD) > 10.2 7/9  - Started Aranesp  100 q Monday 7/9 Hold Fe course with infection  7. Metabolic bone disease - Continue hectorol/binders 8. Nutrition - D1 diet with thin liquids- multivit; doesn't like oral supplments 9. Recent left BKA - Dr. Lajoyce Cornersuda - with wound healing issues - seen by Dr. Lajoyce Cornersuda recommending revision of BKA to AKA with progressive gangrenous changes. Possibly this admission.  10. Right heel ulcer - wound care RN following- 11. DNR 12. MRSA contact precautions 13. Hx of PAF  14. 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 DM - BS high with associated hyponatremia    Sheffield SliderMartha B Bergman, PA-C [facility] Beeper [phone] 03/19/2017,8:28 AM  LOS: 5 days   Pt seen, examined and agree w A/P as above.  Vinson Moselleob Maleke Feria MD [facility] pager [phone]   03/19/2017, 9:20 AM    Subjective:   Feeling better  overall; updated me on plans for Left BKA revision  Objective Vitals:   03/18/17 1707 03/18/17 1954 03/18/17 2104 03/19/17 0639  BP: (!) 119/56  (!) 111/46 122/73  Pulse: 100  100 97  Resp: 18  18 19   Temp: 98.3 F (36.8 C)  98.2 F (36.8 C) 98.7 F (37.1 C)  TempSrc: Oral  Oral Oral  SpO2: 99% 100% 97% 100%  Weight:   103 kg (227 lb 1.2 oz)   Height:       Physical Exam General: comfortable on HD; HOH - needs to speak directly into ear; on O@ Heart: RRR Lungs: no rales/wheezes; dim overall Abdomen: obese soft NTND Extremities: left BKA wrapped; right LE no sig edema, decrotic heel Dialysis Access:  Right upper AVG + bruit (T and R 7/7- Fields)  Additional Objective Labs: Basic Metabolic Panel:  Recent Labs Lab 03/17/17 1930 03/18/17 0319 03/19/17 0539  NA 129* 132* 130*  K 4.9 3.7 5.2*  CL 95* 97* 95*  CO2 21* 20* 18*  GLUCOSE 206* 81 404*  BUN 39* 15 34*  CREATININE 7.25* 3.56* 5.26*  CALCIUM 8.6* 8.5* 9.8  PHOS 6.1* 2.8 5.1*   Liver Function Tests:  Recent Labs Lab 03/17/17 0558 03/17/17 1930 03/18/17 0319 03/19/17 0539  AST 19  --  22 24  ALT 8*  --  7* 10*  ALKPHOS 144*  --  124 136*  BILITOT 0.7  --  0.9 0.9  PROT 8.1  --  7.3 8.1  ALBUMIN 2.4* 2.1* 2.9* 3.0*   No results for input(s): LIPASE, AMYLASE in the last 168 hours. CBC:  Recent Labs Lab 03/16/17 0507 03/17/17 0558  03/17/17 1930 03/18/17 0319 03/19/17 0539  WBC 9.6 10.0  --  8.9 8.6 10.3  NEUTROABS 6.8 6.8  --   --  6.5 9.5*  HGB 10.0* 10.2*  < > 8.9* 8.7* 10.2*  HCT 33.0* 33.7*  < > 29.0* 28.8* 33.0*  MCV 102.2* 101.8*  --  100.7* 102.9* 103.8*  PLT 221 204  --  178 168 203  < > = values in this interval not displayed. Blood Culture    Component Value Date/Time   SDES BLOOD BLOOD LEFT FOREARM 03/14/2017 2149   SPECREQUEST IN PEDIATRIC BOTTLE Blood Culture adequate volume 03/14/2017 2149   CULT (A) 03/14/2017 2149    STAPHYLOCOCCUS SPECIES (COAGULASE NEGATIVE) THE  SIGNIFICANCE OF ISOLATING THIS ORGANISM FROM A SINGLE SET OF BLOOD CULTURES WHEN MULTIPLE SETS ARE DRAWN IS UNCERTAIN. PLEASE NOTIFY THE MICROBIOLOGY DEPARTMENT WITHIN ONE WEEK IF SPECIATION AND SENSITIVITIES ARE REQUIRED.    REPTSTATUS 03/17/2017 FINAL 03/14/2017 2149    Cardiac Enzymes:  Recent Labs Lab 03/14/17 1949 03/15/17 0220 03/15/17 0736  TROPONINI 0.21* 0.20* 0.24*   CBG:  Recent Labs Lab 03/18/17 1216 03/18/17 1706 03/18/17 2048 03/19/17 0203 03/19/17 0328  GLUCAP 177* 319* 478* 430* 438*   Lab Results  Component Value Date   INR 1.33 02/15/2017   INR 1.33 07/20/2015   INR 1.35 07/19/2015   Studies/Results: Dg Chest Port 1 View  Result Date: 03/18/2017 CLINICAL DATA:  Acute onset of respiratory distress. Initial encounter. EXAM: PORTABLE CHEST 1 VIEW COMPARISON:  Chest radiograph performed 03/16/2017 FINDINGS: The lungs are hypoexpanded. Bilateral central airspace opacification, worse on the left, may reflect asymmetric pulmonary edema or possibly pneumonia. No definite pleural effusion or pneumothorax is seen. The cardiomediastinal silhouette is borderline enlarged. No acute osseous abnormalities are identified. IMPRESSION: Lungs hypoexpanded. Bilateral central airspace opacification, worse on the left, may reflect asymmetric pulmonary edema or possibly pneumonia. Borderline cardiomegaly. Electronically Signed   By: Roanna Raider M.D.   On: 03/18/2017 06:09   Medications: . sodium chloride Stopped (03/17/17 0945)  . sodium chloride    . sodium chloride    . sodium chloride    . ceFEPime (MAXIPIME) IV Stopped (03/18/17 2139)  . vancomycin     . aspirin EC  81 mg Oral Daily  . budesonide (PULMICORT) nebulizer solution  0.25 mg Nebulization BID  . Chlorhexidine Gluconate Cloth  6 each Topical Q0600  . darbepoetin (ARANESP) injection - DIALYSIS  100 mcg Intravenous Q Mon-HD  . doxercalciferol  1 mcg Intravenous Q M,W,F-HD  . heparin  5,000 Units Subcutaneous  Q8H  . insulin aspart  0-9 Units Subcutaneous TID WC  . insulin glargine  28 Units Subcutaneous BID  . levothyroxine  88 mcg Oral QAC breakfast  . lidocaine  1 patch Transdermal Q24H  . mouth rinse  15 mL Mouth Rinse BID  . methylPREDNISolone (SOLU-MEDROL) injection  40 mg Intravenous Q12H  . midodrine  10 mg Oral Daily  . multivitamin with minerals  1 tablet Oral Daily  . mupirocin ointment  1 application Nasal BID  . sevelamer carbonate  2.4 g Oral TID WC  . silver sulfADIAZINE  1 application Topical Daily  . sodium chloride HYPERTONIC  4 mL Nebulization Daily

## 2017-03-20 LAB — CBC WITH DIFFERENTIAL/PLATELET
Basophils Absolute: 0 10*3/uL (ref 0.0–0.1)
Basophils Relative: 0 %
Eosinophils Absolute: 0 10*3/uL (ref 0.0–0.7)
Eosinophils Relative: 0 %
HEMATOCRIT: 33 % — AB (ref 36.0–46.0)
HEMOGLOBIN: 10 g/dL — AB (ref 12.0–15.0)
LYMPHS ABS: 0.6 10*3/uL — AB (ref 0.7–4.0)
Lymphocytes Relative: 5 %
MCH: 31.6 pg (ref 26.0–34.0)
MCHC: 30.3 g/dL (ref 30.0–36.0)
MCV: 104.4 fL — ABNORMAL HIGH (ref 78.0–100.0)
MONOS PCT: 3 %
Monocytes Absolute: 0.4 10*3/uL (ref 0.1–1.0)
NEUTROS ABS: 11.3 10*3/uL — AB (ref 1.7–7.7)
NEUTROS PCT: 92 %
Platelets: 158 10*3/uL (ref 150–400)
RBC: 3.16 MIL/uL — ABNORMAL LOW (ref 3.87–5.11)
RDW: 18 % — ABNORMAL HIGH (ref 11.5–15.5)
WBC: 12.3 10*3/uL — ABNORMAL HIGH (ref 4.0–10.5)

## 2017-03-20 LAB — MAGNESIUM: Magnesium: 2.3 mg/dL (ref 1.7–2.4)

## 2017-03-20 LAB — COMPREHENSIVE METABOLIC PANEL
ALT: 11 U/L — ABNORMAL LOW (ref 14–54)
ANION GAP: 11 (ref 5–15)
AST: 15 U/L (ref 15–41)
Albumin: 3 g/dL — ABNORMAL LOW (ref 3.5–5.0)
Alkaline Phosphatase: 135 U/L — ABNORMAL HIGH (ref 38–126)
BILIRUBIN TOTAL: 0.6 mg/dL (ref 0.3–1.2)
BUN: 28 mg/dL — ABNORMAL HIGH (ref 6–20)
CALCIUM: 10 mg/dL (ref 8.9–10.3)
CO2: 25 mmol/L (ref 22–32)
Chloride: 97 mmol/L — ABNORMAL LOW (ref 101–111)
Creatinine, Ser: 3.68 mg/dL — ABNORMAL HIGH (ref 0.44–1.00)
GFR calc non Af Amer: 12 mL/min — ABNORMAL LOW (ref >60)
GFR, EST AFRICAN AMERICAN: 14 mL/min — AB (ref >60)
Glucose, Bld: 373 mg/dL — ABNORMAL HIGH (ref 65–99)
Potassium: 4.2 mmol/L (ref 3.5–5.1)
Sodium: 133 mmol/L — ABNORMAL LOW (ref 135–145)
TOTAL PROTEIN: 8.2 g/dL — AB (ref 6.5–8.1)

## 2017-03-20 LAB — PHOSPHORUS: PHOSPHORUS: 4.5 mg/dL (ref 2.5–4.6)

## 2017-03-20 LAB — GLUCOSE, CAPILLARY
GLUCOSE-CAPILLARY: 341 mg/dL — AB (ref 65–99)
GLUCOSE-CAPILLARY: 416 mg/dL — AB (ref 65–99)
GLUCOSE-CAPILLARY: 373 mg/dL — AB (ref 65–99)
GLUCOSE-CAPILLARY: 332 mg/dL — AB (ref 65–99)

## 2017-03-20 MED ORDER — PRO-STAT SUGAR FREE PO LIQD
30.0000 mL | Freq: Two times a day (BID) | ORAL | Status: DC
  Administered 2017-03-20 – 2017-03-21 (×2): 30 mL via ORAL
  Filled 2017-03-20 (×5): qty 30

## 2017-03-20 MED ORDER — INSULIN ASPART 100 UNIT/ML ~~LOC~~ SOLN
4.0000 [IU] | Freq: Three times a day (TID) | SUBCUTANEOUS | Status: DC
  Administered 2017-03-21 (×2): 4 [IU] via SUBCUTANEOUS

## 2017-03-20 MED ORDER — INSULIN ASPART 100 UNIT/ML ~~LOC~~ SOLN
7.0000 [IU] | Freq: Once | SUBCUTANEOUS | Status: AC
  Administered 2017-03-20: 7 [IU] via SUBCUTANEOUS

## 2017-03-20 MED ORDER — METHYLPREDNISOLONE SODIUM SUCC 40 MG IJ SOLR
40.0000 mg | Freq: Every day | INTRAMUSCULAR | Status: DC
  Administered 2017-03-21: 40 mg via INTRAVENOUS
  Filled 2017-03-20: qty 1

## 2017-03-20 NOTE — Progress Notes (Signed)
CRITICAL VALUE ALERT  Critical Value:  CBG 416  Date & Time Notied:  03/20/2017 @ 13:15  Provider Notified: Dr Marland McalpineSheikh  Orders Received/Actions taken: Orders received,and given.

## 2017-03-20 NOTE — Evaluation (Signed)
Occupational Therapy Evaluation Patient Details Name: Kimberly [name] MRN: [id number] DOB: Apr 15, 1956 Today's Date: 03/20/2017    History of Present Illness Kimberly [name] is a 61 y.o. female with history of ESRD on hemodialysis on Monday Wednesday Friday started developing sharp shooting chest pain while during dialysis. Pain was nonradiating retrosternal and had some productive cough.  Patient was recently admitted for left foot infection and had undergone transtibial amputation. Patient also has developed new ulcers on the right foot. Working diagnosis of pneumonia   Clinical Impression   PT admitted with L foot infection, removal of clot in AVG and R foot amputation. Pt currently with functional limitiations due to the deficits listed below (see OT problem list). Pt requires max (A) for LB adls.  Pt will benefit from skilled OT to increase their independence and safety with adls and balance to allow discharge SNF.     Follow Up Recommendations  SNF;Supervision/Assistance - 24 hour    Equipment Recommendations  Other (comment) (to be announced)    Recommendations for Other Services       Precautions / Restrictions Precautions Precautions: Fall Precaution Comments: Contact      Mobility Bed Mobility Overal bed mobility: Needs Assistance Bed Mobility: Supine to Sit     Supine to sit: Min guard     General bed mobility comments: Close guard for safety; heavy use of rail, but not needing physical assist; noted she likes to scoot forward at EOB -- close guard for safety, and cues not to scoot too far  Transfers Overall transfer level: Needs assistance               General transfer comment: Used Maximove for dependent transfer OOB to chair; noting good lateral leans and weight shifts to place bed pads in prep for Maximove pad placement    Balance Overall balance assessment: Needs assistance Sitting-balance support: Single extremity supported;Bilateral upper extremity  supported;No upper extremity supported Sitting balance-Leahy Scale: Good Sitting balance - Comments: Took time to perform heavy lateral leans R and L; cues for technqiue and close guard for safety                                   ADL either performed or assessed with clinical judgement   ADL Overall ADL's : Needs assistance/impaired Eating/Feeding: Independent   Grooming: Wash/dry hands;Wash/dry face;Oral care;Set up   Upper Body Bathing: Set up   Lower Body Bathing: Maximal assistance                         General ADL Comments: requires hoyer lift to chair . pt demonstrates lateral lean R and L to place pad in sitting for hoyer. Pt incontinent and unaware     Vision         Perception     Praxis      Pertinent Vitals/Pain Pain Assessment: Faces Faces Pain Scale: Hurts little more Pain Location: bottom Pain Descriptors / Indicators: Aching Pain Intervention(s): Monitored during session;Premedicated before session;Repositioned     Hand Dominance Right   Extremity/Trunk Assessment Upper Extremity Assessment Upper Extremity Assessment: Overall WFL for tasks assessed   Lower Extremity Assessment Lower Extremity Assessment: Defer to PT evaluation;RLE deficits/detail;LLE deficits/detail RLE Deficits / Details: Heel wound present LLE Deficits / Details: BKA with possible AKA this admission per notes   Cervical / Trunk Assessment Cervical / Trunk Assessment:  Kyphotic (rounded shoulders)   Communication Communication Communication: HOH   Cognition Arousal/Alertness: Awake/alert Behavior During Therapy: WFL for tasks assessed/performed Overall Cognitive Status: Within Functional Limits for tasks assessed (for simple mobility)                                 General Comments: gets anxious easily   General Comments  requires O2    Exercises     Shoulder Instructions      Home Living Family/patient expects to be  discharged to:: Skilled nursing facility                                 Additional Comments: 2L O2 at home      Prior Functioning/Environment Level of Independence: Needs assistance  Gait / Transfers Assistance Ne[facility]              OT Problem List: Decreased strength;Decreased activity tolerance;Impaired balance (sitting and/or standing);Decreased safety awareness;Decreased knowledge of use of DME or AE;Decreased knowledge of precautions;Pain;Obesity;Cardiopulmonary status limiting activity      OT Treatment/Interventions: Self-care/ADL training;Therapeutic exercise;Energy conservation;DME and/or AE instruction;Therapeutic activities;Patient/family education;Balance training    OT Goals(Current goals can be found in the care plan section) Acute Rehab OT Goals Patient Stated Goal: Indicated she is working on Hy[facility]rolog[facility]liding [facility] OT Goal Formulation: With patient Time For Goal Achievement: 04/03/17 Potential to Achieve Goals: Good  OT Frequency: Min 2X/week   Barriers to D/C:            Co-evaluation PT/OT/SLP Co-Evaluation/Treatment: Yes Reason for Co-Treatment: Complexity of the patient's impairments (multi-system involvement);Necessary to address cognition/behavior during functional activity;For patient/therapist safety;To address functional/ADL transfers   OT goals addressed during session: ADL's and self-care;Proper use of Adaptive equipment and DME;Strengthening/ROM      AM-PAC PT "6 Clicks" Daily Activity     Outcome Measure Help from another person eating meals?: None Help from another person taking care of personal grooming?: A Little Help from another person toileting, which includes using toliet, bedpan, or urinal?: A Lot Help from another person bathing (including washing, rinsing, drying)?: A Lot Help from another person to put on and taking off regular upper body clothing?: A Lot Help from  another person to put on and taking off regular lower body clothing?: A Lot 6 Click Score: 15   End of Session Equipment Utilized During Treatment: Oxygen Nurse Communication: Mobility status;Precautions  Activity Tolerance: Patient tolerated treatment well Patient left: in chair;with call bell/phone within reach;with chair alarm set  OT Visit Diagnosis: Unsteadiness on feet (R26.81)                Time: [id number] OT Time Calculation (min): 28 min Charges:  OT General Charges $OT Visit: 1 Procedure OT Evaluation $OT Eval Moderate Complexity: 1 Procedure G-Codes:      Mateo FlowJones, Brynn   OTR/L Pager: [phone]: [phone] Office: [phone] .   Boone MasterJones, Celenia Hruska B 03/20/2017, 5:23 PM

## 2017-03-20 NOTE — Evaluation (Addendum)
Physical Therapy Evaluation Patient Details Name: Kimberly [name] MRN: [id number] DOB: Jul 24, 1956 Today's Date: 03/20/2017   History of Present Illness  Kimberly ManilaSusan K Crail is a 61 y.o. female with history of ESRD on hemodialysis on Monday Wednesday Friday started developing sharp shooting chest pain while during dialysis. Pain was nonradiating retrosternal and had some productive cough.  Patient was recently admitted for left foot infection and had undergone transtibial amputation. Patient also has developed new ulcers on the right foot. Working diagnosis of pneumonia  Clinical Impression   Pt admitted with above diagnosis. Pt currently with functional limitations due to the deficits listed below (see PT Problem List). Get a bit anxious with moving; showed very good bed mobility -- should be able to work on lateral scoot transfers next session; Agree with return to SNF for post-acute rehab to maximize independence and safety with mobility and continue work on functional transfers;  Pt will benefit from skilled PT to increase their independence and safety with mobility to allow discharge to the venue listed below.       Follow Up Recommendations SNF    Equipment Recommendations  Other (comment);Wheelchair (measurements PT);Wheelchair cushion (measurements PT) (sliding board)    Recommendations for Other Services       Precautions / Restrictions Precautions Precautions: Fall Precaution Comments: Contact      Mobility  Bed Mobility Overal bed mobility: Needs Assistance Bed Mobility: Supine to Sit     Supine to sit: Min guard     General bed mobility comments: Close guard for safety; heavy use of rail, but not needing physical assist; noted she likes to scoot forward at EOB -- close guard for safety, and cues not to scoot too far  Transfers Overall transfer level: Needs assistance               General transfer comment: Used Maximove for dependent transfer OOB to chair; noting  good lateral leans and weight shifts to place bed pads in prep for Maximove pad placement  Ambulation/Gait                Stairs            Wheelchair Mobility    Modified Rankin (Stroke Patients Only)       Balance Overall balance assessment: Needs assistance Sitting-balance support: Single extremity supported;Bilateral upper extremity supported;No upper extremity supported Sitting balance-Leahy Scale: Good Sitting balance - Comments: Took time to perform heavy lateral leans R and L; cues for technqiue and close guard for safety                                     Pertinent Vitals/Pain Pain Assessment: Faces Faces Pain Scale: Hurts little more Pain Location: bottom Pain Descriptors / Indicators: Aching Pain Intervention(s): Monitored during session;Other (comment) (placed sacral dressing)    Home Living Family/patient expects to be discharged to:: Skilled nursing facility                 Additional Comments: 2L O2 at home    Prior Function Level of Independence: Needs assistance   Gait / Transfers Assistance Needed[facility]           Hand Dominance        Extremity/Trunk Assessment   Upper Extremity Assessment Upper Extremity Assessment: Defer to OT evaluation    Lower Extremity Assessment Lower Extremity Assessment: Generalized  weakness (L BKA with good knee extension; dressed sores R foot)       Communication      Cognition Arousal/Alertness: Awake/alert Behavior During Therapy: WFL for tasks assessed/performed Overall Cognitive Status: Within Functional Limits for tasks assessed (for simple mobility)                                 General Comments: gets anxious easily      General Comments General comments (skin integrity, edema, etc.): Session conducted on supplemental O2    Exercises     Assessment/Plan    PT Assessment Patient needs continued PT services   PT Problem List Decreased strength;Decreased range of motion;Decreased activity tolerance;Decreased balance;Decreased mobility;Decreased coordination;Decreased knowledge of use of DME;Pain       PT Treatment Interventions DME instruction;Functional mobility training;Therapeutic activities;Therapeutic exercise;Balance training;Patient/family education;Wheelchair mobility training    PT Goals (Current goals can be found in the Care Plan section)  Acute Rehab PT Goals Patient Stated Goal: Indicated she is working on Hydrologist transfers at [facility] PT Goal Formulation: With patient Time For Goal Achievement: 04/03/17 Potential to Achieve Goals: Good    Frequency Min 2X/week   Barriers to discharge        Co-evaluation               AM-PAC PT "6 Clicks" Daily Activity  Outcome Measure Difficulty turning over in bed (including adjusting bedclothes, sheets and blankets)?: A Lot Difficulty moving from lying on back to sitting on the side of the bed? : Total Difficulty sitting down on and standing up from a chair with arms (e.g., wheelchair, bedside commode, etc,.)?: Total Help needed moving to and from a bed to chair (including a wheelchair)?: Total Help needed walking in hospital room?: Total Help needed climbing 3-5 steps with a railing? : Total 6 Click Score: 7    End of Session Equipment Utilized During Treatment: Oxygen (Maximove) Activity Tolerance: Patient tolerated treatment well Patient left: in chair;with call bell/phone within reach;with chair alarm set Nurse Communication: Mobility status PT Visit Diagnosis: Muscle weakness (generalized) (M62.81);Other abnormalities of gait and mobility (R26.89)    Time: 1478-2956 PT Time Calculation (min) (ACUTE ONLY): 28 min   Charges:   PT Evaluation $PT Eval Moderate Complexity: 1 Procedure     PT G Codes:        Van Clines, PT  Acute Rehabilitation Services Pager [phone] Office [phone]   Levi Aland 03/20/2017, 5:11 PM

## 2017-03-20 NOTE — Progress Notes (Addendum)
PROGRESS NOTE    [name]  ZOX:[id number] DOB: [birth year] DOA: 03/14/2017 PCP: Merlene Laughter, MD  Brief Narrative:  Kimberly [name] is a 61 y.o. female with history of ESRD on hemodialysis on Monday Wednesday Friday who started developing sharp shooting chest pain while during dialysis. Pain was nonradiating retrosternal and had some productive cough. Patient states over the last 3 weeks patient has been having some wheezing and coughing. Patient also has been having nasal drainage for which patient was prescribed nasal spray and antibiotics by patient's ENT surgeon. Patient was recently admitted for left foot infection and had undergone Left BKA. Patient also has developed new ulcers on the right foot. In the ER patient is found to be wheezing with chest x-ray showing possible infiltrates. Patient also has productive cough. Patient's clinical picture is compatible with pneumonia and was started on antibiotics. Patient has not had any chest pain the ER. EKG was showing LBBB and troponin was negative. She states she is still wheezing today. Overnight Blood Cx Grew Gram Positive Cocci in 1/2 Cultures. Her AVG also clotted off later yesterday afternoon so Vascular Surgery was consulted and patient underwent thrombectomy and graft revision by Vascular Surgeon Dr. Fabienne Bruns. She underwent dialysis again yesterday. Overnight on 7/8 she had a panic attack and had anxiety and was calmed by her husband. This AM she was feeling better and thinks that she is breathing better and thinks the steroids and flutter valve are helping. PT Evaluated and recommending SNF. Social work consulted for assistance in placement.   Assessment & Plan:   Principal Problem:   HCAP (healthcare-associated pneumonia) Active Problems:   CAD (coronary artery disease), native coronary artery   COPD (chronic obstructive pulmonary disease) (HCC)   Hypothyroidism   Paroxysmal atrial fibrillation (HCC)   Diabetes mellitus with  complication (HCC)   Pneumonia   Dehiscence of amputation stump (HCC)  Healthcare associated pneumonia, improving    -Patient is placed on IV Vancomycin and Cefepime and will de-escalate in AM -Follow cultures. 1/2 Blood Cx + and liklely was a contaminant; Repeat Cx Showed NGTD at 1 day so will D/C IV Vancomycin  -MRSA by PCR was Positive -Check Legionella Pneumophila Serogp 1 Ur Ag and Strep Pnemoniae Urine Antigen still pending  -C/w DuoNeb 4 times a day and q2hprn -C/w Mucinex DM 30-600 mg 1 tab po BID -C/w Flutter Valve and Incentive Spirometry -Adde Hypertonic Saline Nebs x 3 days and now stopped  -If not improving may need Pulmonary Consult  -C/w Pulmicort 0.25 mg Neb BID -Started IV Steroids 40 mg q12h and will cut back to 40 mg daily given Hyperglycemia and poor wound healing on Left BKA -Repeat CXR 03/18/17 showed Lungs hypoexpanded. Bilateral central airspace opacification, worse on the left, may reflect asymmetric pulmonary edema or possibly pneumonia. Borderline cardiomegaly. -Repeat CXR 03/19/17 showed Improving pulmonary edema/interstitial opacities. No confluent consolidation.  COPD exacerbation  -On exam patient has bilateral expiratory wheezes -As Above -Started IV Steroids 40 mg q12h and cut back to 40 mg IV daily  -C/w Supplemental O2  -Abx as above  Gram Positive Staphylococcus Species Bacteremia -1/2 showed Growth on 03/14/17 -?Source but suspect a contaminant  -On IV Vanc/Cefepime as above but will likely De-escalate and stop Vancomycin now that Repeat Cx are NGTD at 1 day. -Stopped IV Vancomycin today -C/w IV Cefepime   Chest pain  -Appears atypical.  -Cycled cardiac markers and Tropoin relatively flat at 0.21 -> 0.20 -> 0.24;  -Markers likely  elevated due to ESRD -Continue to Montior   Left leg wound status post recent transtibial amputation also has a new one on the right leg on the Right Heel  -There are patchy areas of black eschar scattered across the  location; largest is to the left outer wound; 2X2cm.  No fluctuance, odor or drainage from the eschar. -WOC Nurse Consult done and Recommended consulting Dr. Lajoyce Corners to Assess Left Stump Wound -Full thickness wound in the middle of the staple line; 2.5X2.5X2cm, dark red wound bed, mod amt green-tinged drainage, slight odor. And WOC nurse recommended Dressing procedure/placement/frequency:  Float right heel to reduce pressure.  Foam dressing to protect from further injury.  Pt states she is aware that deep tissue injuries are high risk to evolve into full thickness tissue loss. -Discussed with Dr. Lajoyce Corners who recommended getting an X-Ray which showed No acute abnormality or explanation for femur pain. Below-the-knee amputation, resection margins are smooth. Skin staples in place. Skin defect in the surgical wound medially that does not extend to bone -Dr. Lajoyce Corners recommending continuing dressing changes per wound ostomy nursing and states patient will need a revision to an AKA and will proceed once medically stable during this hospitalization or done as an outpatient;  -*Discussed with Dr. Lajoyce Corners yesterday over the phone and recommends that she will likely have it done as an outpatient once she is over her PNA   Diabetes Mellitus Type 2  -On Lantus 28 units BID and will increase to 32 units BID and increased Sensitive Novolog Sliding Scale Coverage AC to Resistant Novolog SSI AC/HS -CBG's ranging from 341-416 -Diabetes Education Coordinator consulted and appreciated recommendations  -Added 4 units of Novolog TID wm if patient eats >50% of her Meal  Hypothyroidism -C/w Synthroid 88 mcg  Anemia probably from renal disease  -Hb/Hct went from 8.7/28.8 -> 10.2/33.0 -> 10.0/33.0 -Monitor for S/Sx of Bleeding -Continue to Follow CBC's  History of paroxysmal Atrial Fibrillation  -Patient had refused anticoagulation previously. -Continue to Monitor on Telemetry   ESRD on Hemodialysis - Monday Wednesday and  Friday.  -Consulted Nephrology for Maintenance of Hemodialysis. -C/w Sevelamer Carbonate 2.4 g po TID and with Doxercalciferol 1 mcg IV MWF -Ferric Gluconate 125 mg IV MWF held due to infection -Patient was supposed to go for HD yesterday but AVG clotted; IR unable to declot so VVS Dr. Darrick Penna was consulted -Dr. Darrick Penna did thrombectomy and graft revision 03/17/17 and Graft was ok to use -Per Nephro receiving Dialysis Tomorrow (03/21/17)  DVT prophylaxis: Heparin 5,000 units sq q8h Code Status: DO NOT RESUSCITATE Family Communication: Brother at bedside  Disposition Plan: SNF Recommended if patient is agreeable  Consultants:   Nephrology Dr. Aram Beecham Dunham/Dr. Delano Metz  Ortho Dr. Aldean Baker  Vascular Surgery Dr. Fabienne Bruns   Procedures: Hemodialysis    Antimicrobials:  Anti-infectives    Start     Dose/Rate Route Frequency Ordered Stop   03/19/17 2000  ceFEPIme (MAXIPIME) 2 g in dextrose 5 % 50 mL IVPB     2 g 100 mL/hr over 30 Minutes Intravenous Every M-W-F (2000) 03/19/17 1107 03/23/17 1959   03/19/17 1200  vancomycin (VANCOCIN) IVPB 1000 mg/200 mL premix  Status:  Discontinued     1,000 mg 200 mL/hr over 60 Minutes Intravenous Every M-W-F (Hemodialysis) 03/18/17 1128 03/20/17 0835   03/19/17 0857  vancomycin (VANCOCIN) 1-5 GM/200ML-% IVPB    Comments:  Carlyon Prows   : cabinet override      03/19/17 0857 03/19/17 2059  03/17/17 1900  vancomycin (VANCOCIN) IVPB 1000 mg/200 mL premix     1,000 mg 200 mL/hr over 60 Minutes Intravenous Every T-Th-Sa (Hemodialysis) 03/17/17 1829 03/18/17 0015   03/17/17 0730  cefUROXime (ZINACEF) 1.5 g in dextrose 5 % 50 mL IVPB     1.5 g 100 mL/hr over 30 Minutes Intravenous  Once 03/17/17 0722 03/17/17 0805   03/17/17 0723  dextrose 5 % with cefUROXime (ZINACEF) ADS Med    Comments:  Shireen Quan   : cabinet override      03/17/17 0723 03/17/17 0750   03/16/17 1200  vancomycin (VANCOCIN) IVPB 1000 mg/200 mL premix  Status:   Discontinued     1,000 mg 200 mL/hr over 60 Minutes Intravenous Every M-W-F (Hemodialysis) 03/15/17 0851 03/16/17 1411   03/15/17 2000  ceFEPIme (MAXIPIME) 1 g in dextrose 5 % 50 mL IVPB  Status:  Discontinued     1 g 100 mL/hr over 30 Minutes Intravenous Every 24 hours 03/14/17 1952 03/19/17 1107   03/14/17 2030  vancomycin (VANCOCIN) IVPB 1000 mg/200 mL premix     1,000 mg 200 mL/hr over 60 Minutes Intravenous  Once 03/14/17 1943 03/15/17 0019   03/14/17 1915  ceFEPIme (MAXIPIME) 2 g in dextrose 5 % 50 mL IVPB     2 g 100 mL/hr over 30 Minutes Intravenous  Once 03/14/17 1902 03/14/17 2013     Subjective: Seen and examined at bedside and stated she is feeling better and happier. Breathing is improving. No nausea or vomiting. Thinks having fluid removed from dialysis yesterday helped.   Objective: Vitals:   03/19/17 2125 03/20/17 0457 03/20/17 0905 03/20/17 1722  BP:  128/60  (!) 122/91  Pulse:  (!) 101 (!) 104 100  Resp:  20 20 19   Temp:  98.4 F (36.9 C)  98 F (36.7 C)  TempSrc:  Oral  Oral  SpO2: 100% 100% 100% 99%  Weight:      Height:        Intake/Output Summary (Last 24 hours) at 03/20/17 1825 Last data filed at 03/20/17 1610  Gross per 24 hour  Intake              370 ml  Output                0 ml  Net              370 ml   Filed Weights   03/19/17 0713 03/19/17 1143 03/19/17 2110  Weight: 104 kg (229 lb 4.5 oz) 102.5 kg (225 lb 15.5 oz) 105.6 kg (232 lb 12.9 oz)   Examination: Physical Exam:  Constitutional: Pleasant obese WN/WD Caucasian female in NAD appears calm and comfortable.  Eyes: Sclerae anicteric; Conjunctivae Non-injected ENMT: Extremely hard of hearing and have to yell in Right ear. Mucous membranes appear moist; Has some hirsutisim Neck: Supple with no JVD Respiratory: Diminished to auscultation; Minimal bilateral wheezing with mild rhonchi. Patient was wearing Supplemental O2 but was not tachypenic. Cardiovascular: RRR; No appreciable  edema Abdomen: Soft, NT, Distended due to body habitus; Bowel sounds present GU: deferred Musculoskeletal: Has Left BKA; No cyanosis Skin: Right Heel ulcer noted and Left BKA wound dehiscence; Has Right AVG with good ausculated bruit and palpable thrill Neurologic: Extremely hard of hearing but other CN grossly intact. Moves extremities independely Psychiatric: Normal mood and affect. Intact judgement and insight  Data Reviewed: I have personally reviewed following labs and imaging studies  CBC:  Recent Labs Lab 03/16/17  5366 03/17/17 4403 03/17/17 4742 03/17/17 1930 03/18/17 0319 03/19/17 0539 03/20/17 0459  WBC 9.6 10.0  --  8.9 8.6 10.3 12.3*  NEUTROABS 6.8 6.8  --   --  6.5 9.5* 11.3*  HGB 10.0* 10.2* 10.5* 8.9* 8.7* 10.2* 10.0*  HCT 33.0* 33.7* 31.0* 29.0* 28.8* 33.0* 33.0*  MCV 102.2* 101.8*  --  100.7* 102.9* 103.8* 104.4*  PLT 221 204  --  178 168 203 158   Basic Metabolic Panel:  Recent Labs Lab 03/16/17 0507 03/17/17 0558 03/17/17 0717 03/17/17 1930 03/18/17 0319 03/19/17 0539 03/19/17 0815 03/20/17 0459  NA 131* 132* 133* 129* 132* 130*  --  133*  K 4.2 4.9 4.6 4.9 3.7 5.2*  --  4.2  CL 96* 96*  --  95* 97* 95*  --  97*  CO2 23 22  --  21* 20* 18*  --  25  GLUCOSE 190* 113* 99 206* 81 404* 372* 373*  BUN 28* 37*  --  39* 15 34*  --  28*  CREATININE 5.87* 6.99*  --  7.25* 3.56* 5.26*  --  3.68*  CALCIUM 9.0 9.3  --  8.6* 8.5* 9.8  --  10.0  MG 2.2 2.3  --   --  2.0 2.4  --  2.3  PHOS 5.1* 5.4*  --  6.1* 2.8 5.1*  --  4.5   GFR: Estimated Creatinine Clearance: 18.8 mL/min (A) (by C-G formula based on SCr of 3.68 mg/dL (H)). Liver Function Tests:  Recent Labs Lab 03/16/17 0507 03/17/17 0558 03/17/17 1930 03/18/17 0319 03/19/17 0539 03/20/17 0459  AST 19 19  --  22 24 15   ALT 7* 8*  --  7* 10* 11*  ALKPHOS 146* 144*  --  124 136* 135*  BILITOT 0.6 0.7  --  0.9 0.9 0.6  PROT 7.7 8.1  --  7.3 8.1 8.2*  ALBUMIN 2.4* 2.4* 2.1* 2.9* 3.0* 3.0*    No results for input(s): LIPASE, AMYLASE in the last 168 hours. No results for input(s): AMMONIA in the last 168 hours. Coagulation Profile: No results for input(s): INR, PROTIME in the last 168 hours. Cardiac Enzymes:  Recent Labs Lab 03/14/17 1949 03/15/17 0220 03/15/17 0736  TROPONINI 0.21* 0.20* 0.24*   BNP (last 3 results) No results for input(s): PROBNP in the last 8760 hours. HbA1C: No results for input(s): HGBA1C in the last 72 hours. CBG:  Recent Labs Lab 03/19/17 1644 03/19/17 2125 03/20/17 0809 03/20/17 1132 03/20/17 1712  GLUCAP 323* 479* 341* 416* 373*   Lipid Profile: No results for input(s): CHOL, HDL, LDLCALC, TRIG, CHOLHDL, LDLDIRECT in the last 72 hours. Thyroid Function Tests: No results for input(s): TSH, T4TOTAL, FREET4, T3FREE, THYROIDAB in the last 72 hours. Anemia Panel: No results for input(s): VITAMINB12, FOLATE, FERRITIN, TIBC, IRON, RETICCTPCT in the last 72 hours. Sepsis Labs:  Recent Labs Lab 03/14/17 1925  LATICACIDVEN 1.82    Recent Results (from the past 240 hour(s))  Blood culture (routine x 2)     Status: None   Collection Time: 03/14/17  9:38 PM  Result Value Ref Range Status   Specimen Description BLOOD LEFT ANTECUBITAL  Final   Special Requests IN PEDIATRIC BOTTLE Blood Culture adequate volume  Final   Culture NO GROWTH 5 DAYS  Final   Report Status 03/19/2017 FINAL  Final  Blood culture (routine x 2)     Status: Abnormal   Collection Time: 03/14/17  9:49 PM  Result Value Ref Range Status  Specimen Description BLOOD BLOOD LEFT FOREARM  Final   Special Requests IN PEDIATRIC BOTTLE Blood Culture adequate volume  Final   Culture  Setup Time   Final    GRAM POSITIVE COCCI IN CLUSTERS IN PEDIATRIC BOTTLE CRITICAL RESULT CALLED TO, READ BACK BY AND VERIFIED WITH: J. LEDFORD, AT [id number] 03/16/17 BY D. VANHOOK    Culture (A)  Final    STAPHYLOCOCCUS SPECIES (COAGULASE NEGATIVE) THE SIGNIFICANCE OF ISOLATING THIS ORGANISM FROM  A SINGLE SET OF BLOOD CULTURES WHEN MULTIPLE SETS ARE DRAWN IS UNCERTAIN. PLEASE NOTIFY THE MICROBIOLOGY DEPARTMENT WITHIN ONE WEEK IF SPECIATION AND SENSITIVITIES ARE REQUIRED.    Report Status 03/17/2017 FINAL  Final  MRSA PCR Screening     Status: Abnormal   Collection Time: 03/15/17  9:41 AM  Result Value Ref Range Status   MRSA by PCR POSITIVE (A) NEGATIVE Final    Comment:        The GeneXpert MRSA Assay (FDA approved for NASAL specimens only), is one component of a comprehensive MRSA colonization surveillance program. It is not intended to diagnose MRSA infection nor to guide or monitor treatment for MRSA infections. CRITICAL RESULT CALLED TO, READ BACK BY AND VERIFIED WITH: C. YAP, RN AT 1210 ON 03/15/17 BY C. JESSUP, MLT.   Culture, blood (Routine X 2) w Reflex to ID Panel     Status: None (Preliminary result)   Collection Time: 03/18/17  3:19 AM  Result Value Ref Range Status   Specimen Description BLOOD LEFT HAND  Final   Special Requests IN PEDIATRIC BOTTLE Blood Culture adequate volume  Final   Culture NO GROWTH 2 DAYS  Final   Report Status PENDING  Incomplete  Culture, blood (Routine X 2) w Reflex to ID Panel     Status: None (Preliminary result)   Collection Time: 03/18/17  3:26 AM  Result Value Ref Range Status   Specimen Description BLOOD LEFT HAND  Final   Special Requests IN PEDIATRIC BOTTLE Blood Culture adequate volume  Final   Culture NO GROWTH 2 DAYS  Final   Report Status PENDING  Incomplete    Radiology Studies: Dg Chest 2 View  Result Date: 03/19/2017 CLINICAL DATA:  Pneumonia. Persisting cough and shortness of breath. EXAM: CHEST  2 VIEW COMPARISON:  Radiographs 03/18/2017, additional priors. FINDINGS: Borderline mild cardiomegaly, unchanged mediastinal contours. Improving pulmonary edema/interstitial opacities. No consolidation to suggest pneumonia, particularly at site previously questioned in the left lung base. No pleural effusion. No pneumothorax.  Stable osseous structures. IMPRESSION: Improving pulmonary edema/interstitial opacities. No confluent consolidation. Electronically Signed   By: Rubye OaksMelanie  Ehinger M.D.   On: 03/19/2017 16:30   Scheduled Meds: . aspirin EC  81 mg Oral Daily  . budesonide (PULMICORT) nebulizer solution  0.25 mg Nebulization BID  . Chlorhexidine Gluconate Cloth  6 each Topical Q0600  . darbepoetin (ARANESP) injection - DIALYSIS  60 mcg Intravenous Q Mon-HD  . doxercalciferol  1 mcg Intravenous Q M,W,F-HD  . feeding supplement (PRO-STAT SUGAR FREE 64)  30 mL Oral BID  . heparin  5,000 Units Subcutaneous Q8H  . insulin aspart  0-20 Units Subcutaneous TID WC  . insulin aspart  0-5 Units Subcutaneous QHS  . insulin glargine  32 Units Subcutaneous BID  . levothyroxine  88 mcg Oral QAC breakfast  . lidocaine  1 patch Transdermal Q24H  . mouth rinse  15 mL Mouth Rinse BID  . methylPREDNISolone (SOLU-MEDROL) injection  40 mg Intravenous Q12H  . midodrine  10 mg  Oral Daily  . multivitamin with minerals  1 tablet Oral Daily  . sevelamer carbonate  2.4 g Oral TID WC  . silver sulfADIAZINE  1 application Topical Daily  . sodium chloride HYPERTONIC  4 mL Nebulization Daily   Continuous Infusions: . sodium chloride Stopped (03/17/17 0945)  . sodium chloride    . ceFEPime (MAXIPIME) IV Stopped (03/19/17 2130)    LOS: 6 days   Merlene Laughter, DO [facility] Pager [phone]  If 7PM-7AM, please contact night-coverage [email or web address] Password [facility] 03/20/2017, 6:25 PM

## 2017-03-20 NOTE — Progress Notes (Signed)
[facility] Progress Note  Dialysis Orders: MWF [facility] 4.5 hours 2 K 2 Ca 400/800 EDW 104 right upper AVGG heparin 6000 load and 5000 mid tmt  venofer 100 through 7/11 hectorol 1 mircera 150 - last 6/7  Assessment/Plan: 1. ? HCAP plus exacerbation of COPD - seems more likely infectious - on IV Vanc/cefepime Repeat CXR 7/9 post HD improving pul edema/infiltrates; Positive blood culture - BC 7/4 +Coag neg staph in one bottle - felt to be contaminant. Repeat BC 7/8 pending 2. ESRD - MWF -4.2 - next HD Wed 3. Clotted AVG - s/p declot revision with Dr. Darrick PennaFields 7/7. Appreciate VVS assistance.  4. BP/volume - chronic low BP - midodrine for BP support - prone to high IDWG but intake much more restricted here. Net UF 3L Post HD wt 103.9kg  Net UF 1.5 Monday with post wt 102.5- continue to titrate down - some depended edema 5. Anemia - Hgb 10.5>8.7( pre HD)> 10.2 7/9 - Started Aranesp 100 q Monday 7/9Hold Fe course with infection  6. Metabolic bone disease - Continue hectorol/binders- Calcium borderline high - may need to hold hectorol - watch 7. Nutrition - D1 diet with thin liquids- multivit; doesn't like oral supplements alb 3 - add prostat 8. Recent left BKA - Dr. Lajoyce Cornersuda - with wound healing issues - seen by Dr. Lajoyce Cornersuda recommending revision of BKA to AKA with progressive gangrenous changes. Possibly this admission.  9. Right heel ulcer - wound care RN following- 10. DNR 11. MRSA contact precautions 12. Hx of PAF  13. 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 DM - BS high with associated hyponatremia 15. Leukocytosis - WBC increasing but started IV steroids 7/8   Sheffield SliderMartha B Bergman, PA-C [facility] Beeper [phone] 03/20/2017,8:57 AM  LOS: 6 days   Pt seen, examined and agree w A/P as above.  Vinson Moselleob Jacorie Ernsberger MD [facility] pager [phone]   03/20/2017, 3:46  PM    Subjective:   Trying to eat protein.  Still with cough. Loose stools  Objective Vitals:   03/19/17 1802 03/19/17 2110 03/19/17 2125 03/20/17 0457  BP: (!) 117/43 134/60  128/60  Pulse: 99 (!) 102  (!) 101  Resp: 18 20  20   Temp: 98.5 F (36.9 C) 98.5 F (36.9 C)  98.4 F (36.9 C)  TempSrc: Oral Oral  Oral  SpO2: 100% 100% 100% 100%  Weight:  105.6 kg (232 lb 12.9 oz)    Height:       Physical Exam General: NAD loose cough Heart: RRR~90s Lungs: dim no rales/wheezes Abdomen: obese soft NT - some dependent edema/buttocks thighs/ Extremities: left BKA wrapped, right LE no sig edema- necrotic heel wrapped Dialysis Access:  Right upper AVGG + bruit   Additional Objective Labs: Basic Metabolic Panel:  Recent Labs Lab 03/18/17 0319 03/19/17 0539 03/19/17 0815 03/20/17 0459  NA 132* 130*  --  133*  K 3.7 5.2*  --  4.2  CL 97* 95*  --  97*  CO2 20* 18*  --  25  GLUCOSE 81 404* 372* 373*  BUN 15 34*  --  28*  CREATININE 3.56* 5.26*  --  3.68*  CALCIUM 8.5* 9.8  --  10.0  PHOS 2.8 5.1*  --  4.5   Liver Function Tests:  Recent Labs Lab 03/18/17 0319 03/19/17 0539 03/20/17 0459  AST 22 24 15   ALT 7* 10* 11*  ALKPHOS 124 136* 135*  BILITOT 0.9 0.9 0.6  PROT 7.3 8.1 8.2*  ALBUMIN 2.9* 3.0* 3.0*   CBC:  Recent Labs Lab 03/17/17 0558  03/17/17 1930 03/18/17 0319 03/19/17 0539 03/20/17 0459  WBC 10.0  --  8.9 8.6 10.3 12.3*  NEUTROABS 6.8  --   --  6.5 9.5* 11.3*  HGB 10.2*  < > 8.9* 8.7* 10.2* 10.0*  HCT 33.7*  < > 29.0* 28.8* 33.0* 33.0*  MCV 101.8*  --  100.7* 102.9* 103.8* 104.4*  PLT 204  --  178 168 203 158  < > = values in this interval not displayed. Blood Culture    Component Value Date/Time   SDES BLOOD LEFT HAND 03/18/2017 0326   SPECREQUEST IN PEDIATRIC BOTTLE Blood Culture adequate volume 03/18/2017 0326   CULT NO GROWTH 1 DAY 03/18/2017 0326   REPTSTATUS PENDING 03/18/2017 0326    Cardiac Enzymes:  Recent Labs Lab  03/14/17 1949 03/15/17 0220 03/15/17 0736  TROPONINI 0.21* 0.20* 0.24*   CBG:  Recent Labs Lab 03/19/17 0328 03/19/17 1212 03/19/17 1644 03/19/17 2125 03/20/17 0809  GLUCAP 438* 212* 323* 479* 341*   Studies/Results: Dg Chest 2 View  Result Date: 03/19/2017 CLINICAL DATA:  Pneumonia. Persisting cough and shortness of breath. EXAM: CHEST  2 VIEW COMPARISON:  Radiographs 03/18/2017, additional priors. FINDINGS: Borderline mild cardiomegaly, unchanged mediastinal contours. Improving pulmonary edema/interstitial opacities. No consolidation to suggest pneumonia, particularly at site previously questioned in the left lung base. No pleural effusion. No pneumothorax. Stable osseous structures. IMPRESSION: Improving pulmonary edema/interstitial opacities. No confluent consolidation. Electronically Signed   By: Rubye Oaks M.D.   On: 03/19/2017 16:30   Medications: . sodium chloride Stopped (03/17/17 0945)  . sodium chloride    . ceFEPime (MAXIPIME) IV Stopped (03/19/17 2130)   . aspirin EC  81 mg Oral Daily  . budesonide (PULMICORT) nebulizer solution  0.25 mg Nebulization BID  . Chlorhexidine Gluconate Cloth  6 each Topical Q0600  . darbepoetin (ARANESP) injection - DIALYSIS  60 mcg Intravenous Q Mon-HD  . doxercalciferol  1 mcg Intravenous Q M,W,F-HD  . heparin  5,000 Units Subcutaneous Q8H  . insulin aspart  0-20 Units Subcutaneous TID WC  . insulin aspart  0-5 Units Subcutaneous QHS  . insulin glargine  32 Units Subcutaneous BID  . levothyroxine  88 mcg Oral QAC breakfast  . lidocaine  1 patch Transdermal Q24H  . mouth rinse  15 mL Mouth Rinse BID  . methylPREDNISolone (SOLU-MEDROL) injection  40 mg Intravenous Q12H  . midodrine  10 mg Oral Daily  . multivitamin with minerals  1 tablet Oral Daily  . mupirocin ointment  1 application Nasal BID  . sevelamer carbonate  2.4 g Oral TID WC  . silver sulfADIAZINE  1 application Topical Daily  . sodium chloride HYPERTONIC  4 mL  Nebulization Daily

## 2017-03-20 NOTE — Progress Notes (Addendum)
Results for [name], Lucerito K (MRN [id number]) as of 03/20/2017 13:56  Ref. Range 03/19/2017 12:12 03/19/2017 16:44 03/19/2017 21:25 03/20/2017 08:09 03/20/2017 11:32  Glucose-Capillary Latest Ref Range: 65 - 99 mg/dL 409212 (H) 811323 (H) 914479 (H) 341 (H) 416 (H)  Noted that blood sugars have been greater than 180 mg/dl.  Noted that Novolog insulin was given for blood sugar at 11:32 of 416 mg/dl. Recommend adding Novolog 4-5 units TID with meals if patient eats at least 50% of meal.  Will continue to monitor blood sugars while in the hospital.   [name] MinceKendra Sonal Dorwart RN BSN CDE Diabetes Coordinator Pager: [phone]  8am-5pm

## 2017-03-21 ENCOUNTER — Inpatient Hospital Stay (HOSPITAL_COMMUNITY): Payer: Medicare Other

## 2017-03-21 LAB — COMPREHENSIVE METABOLIC PANEL
ALT: 10 U/L — ABNORMAL LOW (ref 14–54)
AST: 22 U/L (ref 15–41)
Albumin: 3 g/dL — ABNORMAL LOW (ref 3.5–5.0)
Alkaline Phosphatase: 124 U/L (ref 38–126)
Anion gap: 16 — ABNORMAL HIGH (ref 5–15)
BILIRUBIN TOTAL: 1 mg/dL (ref 0.3–1.2)
BUN: 52 mg/dL — AB (ref 6–20)
CALCIUM: 9.7 mg/dL (ref 8.9–10.3)
CO2: 19 mmol/L — ABNORMAL LOW (ref 22–32)
CREATININE: 4.81 mg/dL — AB (ref 0.44–1.00)
Chloride: 97 mmol/L — ABNORMAL LOW (ref 101–111)
GFR calc Af Amer: 10 mL/min — ABNORMAL LOW (ref >60)
GFR, EST NON AFRICAN AMERICAN: 9 mL/min — AB (ref >60)
Glucose, Bld: 269 mg/dL — ABNORMAL HIGH (ref 65–99)
POTASSIUM: 5 mmol/L (ref 3.5–5.1)
Sodium: 132 mmol/L — ABNORMAL LOW (ref 135–145)
TOTAL PROTEIN: 7.9 g/dL (ref 6.5–8.1)

## 2017-03-21 LAB — CBC WITH DIFFERENTIAL/PLATELET
BASOS ABS: 0 10*3/uL (ref 0.0–0.1)
Basophils Relative: 0 %
Eosinophils Absolute: 0 10*3/uL (ref 0.0–0.7)
Eosinophils Relative: 0 %
HEMATOCRIT: 35.8 % — AB (ref 36.0–46.0)
Hemoglobin: 10.5 g/dL — ABNORMAL LOW (ref 12.0–15.0)
LYMPHS PCT: 7 %
Lymphs Abs: 1 10*3/uL (ref 0.7–4.0)
MCH: 30.5 pg (ref 26.0–34.0)
MCHC: 29.3 g/dL — ABNORMAL LOW (ref 30.0–36.0)
MCV: 104.1 fL — AB (ref 78.0–100.0)
MONO ABS: 0.9 10*3/uL (ref 0.1–1.0)
MONOS PCT: 6 %
NEUTROS ABS: 14 10*3/uL — AB (ref 1.7–7.7)
Neutrophils Relative %: 87 %
Platelets: 161 10*3/uL (ref 150–400)
RBC: 3.44 MIL/uL — ABNORMAL LOW (ref 3.87–5.11)
RDW: 17.8 % — AB (ref 11.5–15.5)
WBC: 16 10*3/uL — ABNORMAL HIGH (ref 4.0–10.5)

## 2017-03-21 LAB — RENAL FUNCTION PANEL
Albumin: 2.9 g/dL — ABNORMAL LOW (ref 3.5–5.0)
Anion gap: 16 — ABNORMAL HIGH (ref 5–15)
BUN: 52 mg/dL — ABNORMAL HIGH (ref 6–20)
CO2: 18 mmol/L — ABNORMAL LOW (ref 22–32)
Calcium: 9.6 mg/dL (ref 8.9–10.3)
Chloride: 98 mmol/L — ABNORMAL LOW (ref 101–111)
Creatinine, Ser: 4.75 mg/dL — ABNORMAL HIGH (ref 0.44–1.00)
GFR calc Af Amer: 10 mL/min — ABNORMAL LOW (ref >60)
GFR calc non Af Amer: 9 mL/min — ABNORMAL LOW (ref >60)
Glucose, Bld: 260 mg/dL — ABNORMAL HIGH (ref 65–99)
Phosphorus: 4.6 mg/dL (ref 2.5–4.6)
Potassium: 5 mmol/L (ref 3.5–5.1)
Sodium: 132 mmol/L — ABNORMAL LOW (ref 135–145)

## 2017-03-21 LAB — MAGNESIUM: MAGNESIUM: 2.3 mg/dL (ref 1.7–2.4)

## 2017-03-21 LAB — GLUCOSE, CAPILLARY
GLUCOSE-CAPILLARY: 388 mg/dL — AB (ref 65–99)
Glucose-Capillary: 122 mg/dL — ABNORMAL HIGH (ref 65–99)
Glucose-Capillary: 257 mg/dL — ABNORMAL HIGH (ref 65–99)

## 2017-03-21 LAB — PHOSPHORUS: Phosphorus: 4.7 mg/dL — ABNORMAL HIGH (ref 2.5–4.6)

## 2017-03-21 MED ORDER — INSULIN ASPART 100 UNIT/ML ~~LOC~~ SOLN
6.0000 [IU] | Freq: Three times a day (TID) | SUBCUTANEOUS | Status: DC
  Administered 2017-03-22 – 2017-03-23 (×3): 6 [IU] via SUBCUTANEOUS

## 2017-03-21 MED ORDER — SODIUM CHLORIDE 0.9 % IV SOLN: 100.0000 mL | INTRAVENOUS | Status: DC | PRN

## 2017-03-21 MED ORDER — PENTAFLUOROPROP-TETRAFLUOROETH EX AERO: 1.0000 "application " | INHALATION_SPRAY | CUTANEOUS | Status: DC | PRN

## 2017-03-21 MED ORDER — LIDOCAINE HCL (PF) 1 % IJ SOLN: 5.0000 mL | INTRAMUSCULAR | Status: DC | PRN

## 2017-03-21 MED ORDER — DEXTROSE 5 % IV SOLN
2.0000 g | INTRAVENOUS | Status: DC
  Administered 2017-03-21: 2 g via INTRAVENOUS
  Filled 2017-03-21 (×2): qty 2

## 2017-03-21 MED ORDER — LIDOCAINE-PRILOCAINE 2.5-2.5 % EX CREA: 1.0000 "application " | TOPICAL_CREAM | CUTANEOUS | Status: DC | PRN

## 2017-03-21 MED ORDER — DOXERCALCIFEROL 4 MCG/2ML IV SOLN
INTRAVENOUS | Status: AC
  Administered 2017-03-21: 1 ug via INTRAVENOUS
  Filled 2017-03-21: qty 2

## 2017-03-21 MED ORDER — ALTEPLASE 2 MG IJ SOLR: 2.0000 mg | Freq: Once | INTRAMUSCULAR | Status: DC | PRN

## 2017-03-21 MED ORDER — GUAIFENESIN-DM 100-10 MG/5ML PO SYRP
15.0000 mL | ORAL_SOLUTION | Freq: Four times a day (QID) | ORAL | Status: DC | PRN
  Administered 2017-03-22 – 2017-03-23 (×2): 15 mL via ORAL
  Filled 2017-03-21 (×3): qty 15

## 2017-03-21 MED ORDER — HEPARIN SODIUM (PORCINE) 1000 UNIT/ML DIALYSIS: 1000.0000 [IU] | INTRAMUSCULAR | Status: DC | PRN

## 2017-03-21 MED ORDER — PREDNISONE 10 MG PO TABS
30.0000 mg | ORAL_TABLET | Freq: Every day | ORAL | Status: DC
  Administered 2017-03-22 – 2017-03-23 (×2): 30 mg via ORAL
  Filled 2017-03-21 (×2): qty 1

## 2017-03-21 NOTE — Progress Notes (Signed)
[facility] Progress Note  Dialysis Orders:MWF [facility] 4.5 hours 2 K 2 Ca 400/800 EDW 104 right upper AVGG heparin 6000 load and 5000 mid tmt  venofer 100 through 7/11 hectorol 1 mircera 150 - last 6/7  Assessment/Plan: 1. ? HCAP plusexacerbation of COPD - on cefepime Repeat CXR 7/9post HD improving pul edema/infiltrates; Positive blood culture - BC 7/4 +Coag neg staph in one bottle - felt to be contaminant.Repeat BC 7/8 pending 2. ESRD - MWF 5 next HD Wed 3. Clotted AVG - s/p declot revision with Dr. Darrick Penna 7/7. Appreciate VVS assistance. Watch prox incision site near axilla 4. BP/volume - chronic low BP - midodrine for BP support - prone to high IDWG but intake much more restricted here. Net UF 3L Post HD wt 103.9kg Net UF 1.5 Monday with post wt 102.5- continue to titrate down - some depended edema; goal 2.4 7/11 5. Anemia - Hgb 10.5    - StartedAranesp 100 q Monday 7/9Holding Fe course with infection  6. Metabolic bone disease - Continue hectorol/binders- Calcium borderline high - may need to hold hectorol - watch 7. Nutrition - D1 diet with thin liquids- multivit; doesn't like oral supplements alb 3 - add prostat 8. Recent left BKA - Dr. Lajoyce Corners - with wound healing issues - seen by Dr. Lajoyce Corners recommending revision of BKA to AKA with progressive gangrenous changes. Possibly this admission.  9. Right heel ulcer - wound care RN following; I doubt this will heal 10. DNR 11. MRSA contact precautions 12. Hx of PAF  13. 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 DM - BS high with associated hyponatremia, though some may be volume related 15. Leukocytosis - WBC increasing but started IV steroids 7/8- afebrile  Sheffield Slider, PA-C [facility] Beeper [phone] 03/21/2017,7:35 AM  LOS: 7 days    Pt seen, examined and agree w A/P as above.  [name] MD [facility] pager [phone]   03/21/2017, 1:55 PM    Subjective:   Coughing more this am, no SOB at rest  Objective Vitals:   03/21/17 0511 03/21/17 0615 03/21/17 0647 03/21/17 0700  BP: (!) 106/54 123/64 123/69 118/86  Pulse: 95 93 90 87  Resp: 17 20    Temp: 97.9 F (36.6 C) 98.1 F (36.7 C)    TempSrc:  Oral    SpO2: 100% 100%    Weight: 105 kg (231 lb 7.7 oz) 104.8 kg (231 lb 0.7 oz)    Height:       Physical Exam goal on HD 2.4 General: chronically ill NAD -  Heart: RRR Lungs: some coarse BS exp wheezes Abdomen: obese soft NT Extremities: left BKA no edema, wrapped; right LE 1 + LE edema- heel necrotic ; some dependent edema thighs/buttocks Dialysis Access: right upper AVGG - prox incision site some serosanguinous drainage- sl tender    Additional Objective Labs: Basic Metabolic Panel:  Recent Labs Lab 03/19/17 0539 03/19/17 0815 03/20/17 0459 03/21/17 0356  NA 130*  --  133* 132*  132*  K 5.2*  --  4.2 5.0  5.0  CL 95*  --  97* 97*  98*  CO2 18*  --  25 19*  18*  GLUCOSE 404* 372* 373* 269*  260*  BUN 34*  --  28* 52*  52*  CREATININE 5.26*  --  3.68* 4.81*  4.75*  CALCIUM 9.8  --  10.0  9.7  9.6  PHOS 5.1*  --  4.5 4.7*  4.6   Liver Function Tests:  Recent Labs Lab 03/19/17 0539 03/20/17 0459 03/21/17 0356  AST 24 15 22   ALT 10* 11* 10*  ALKPHOS 136* 135* 124  BILITOT 0.9 0.6 1.0  PROT 8.1 8.2* 7.9  ALBUMIN 3.0* 3.0* 3.0*  2.9*   CBC:  Recent Labs Lab 03/17/17 1930 03/18/17 0319 03/19/17 0539 03/20/17 0459 03/21/17 0356  WBC 8.9 8.6 10.3 12.3* 16.0*  NEUTROABS  --  6.5 9.5* 11.3* 14.0*  HGB 8.9* 8.7* 10.2* 10.0* 10.5*  HCT 29.0* 28.8* 33.0* 33.0* 35.8*  MCV 100.7* 102.9* 103.8* 104.4* 104.1*  PLT 178 168 203 158 161   Blood Culture    Component Value Date/Time   SDES BLOOD LEFT HAND 03/18/2017 0326   SPECREQUEST IN PEDIATRIC BOTTLE Blood Culture adequate volume 03/18/2017 0326   CULT NO GROWTH 2 DAYS  03/18/2017 0326   REPTSTATUS PENDING 03/18/2017 0326    Cardiac Enzymes:  Recent Labs Lab 03/14/17 1949 03/15/17 0220 03/15/17 0736  TROPONINI 0.21* 0.20* 0.24*   CBG:  Recent Labs Lab 03/19/17 2125 03/20/17 0809 03/20/17 1132 03/20/17 1712 03/20/17 2143  GLUCAP 479* 341* 416* 373* 332*  Studies/Results: Dg Chest 2 View  Result Date: 03/19/2017 CLINICAL DATA:  Pneumonia. Persisting cough and shortness of breath. EXAM: CHEST  2 VIEW COMPARISON:  Radiographs 03/18/2017, additional priors. FINDINGS: Borderline mild cardiomegaly, unchanged mediastinal contours. Improving pulmonary edema/interstitial opacities. No consolidation to suggest pneumonia, particularly at site previously questioned in the left lung base. No pleural effusion. No pneumothorax. Stable osseous structures. IMPRESSION: Improving pulmonary edema/interstitial opacities. No confluent consolidation. Electronically Signed   By: Rubye OaksMelanie  Ehinger M.D.   On: 03/19/2017 16:30   Medications: . sodium chloride Stopped (03/17/17 0945)  . sodium chloride    . sodium chloride    . sodium chloride    . ceFEPime (MAXIPIME) IV Stopped (03/19/17 2130)   . aspirin EC  81 mg Oral Daily  . budesonide (PULMICORT) nebulizer solution  0.25 mg Nebulization BID  . darbepoetin (ARANESP) injection - DIALYSIS  60 mcg Intravenous Q Mon-HD  . doxercalciferol  1 mcg Intravenous Q M,W,F-HD  . feeding supplement (PRO-STAT SUGAR FREE 64)  30 mL Oral BID  . heparin  5,000 Units Subcutaneous Q8H  . insulin aspart  0-20 Units Subcutaneous TID WC  . insulin aspart  0-5 Units Subcutaneous QHS  . insulin aspart  4 Units Subcutaneous TID WC  . insulin glargine  32 Units Subcutaneous BID  . levothyroxine  88 mcg Oral QAC breakfast  . lidocaine  1 patch Transdermal Q24H  . mouth rinse  15 mL Mouth Rinse BID  . methylPREDNISolone (SOLU-MEDROL) injection  40 mg Intravenous Daily  . midodrine  10 mg Oral Daily  . multivitamin with minerals  1  tablet Oral Daily  . sevelamer carbonate  2.4 g Oral TID WC  . silver sulfADIAZINE  1 application Topical Daily  . sodium chloride HYPERTONIC  4 mL Nebulization Daily

## 2017-03-21 NOTE — Progress Notes (Signed)
PROGRESS NOTE   Kimberly [name]  WJX:[id number]    DOB: [birth year]    DOA: 03/14/2017  PCP: Merlene Laughter, MD   I have briefly reviewed patients previous medical records in [facility].  Brief Narrative:  61 year old female with PMH of ESRD on MWF HD, CAD, chronic diastolic CHF, COPD on home oxygen, anxiety & depression, GERD, HLD, HTN, hypothyroid, DM 2 with peripheral neuropathy, stroke, recent left BKA, presented with productive cough, wheezing, retrosternal nonradiating chest pain, postnasal drainage and admitted for possible HCAP and COPD exacerbation. Nephrology consulting for dialysis needs.   Assessment & Plan:   Principal Problem:   HCAP (healthcare-associated pneumonia) Active Problems:   CAD (coronary artery disease), native coronary artery   COPD (chronic obstructive pulmonary disease) (HCC)   Hypothyroidism   Paroxysmal atrial fibrillation (HCC)   Diabetes mellitus with complication (HCC)   Pneumonia   Dehiscence of amputation stump (HCC)   1. Suspected healthcare associated pneumonia: One of 2 blood cultures from 7/4 showed coagulase-negative staph, likely contaminant. The other one was negative him a final report. Blood cultures 2 from 7/8: Negative to date. As per pharmacy, completes 8 days of IV cefepime on 7/11 and will discontinue. Completed treatment. Repeat chest x-ray 7/11 shows resolution of pulmonary infiltrates. 2. COPD exacerbation: Precipitated by possible pneumonia. No clinical bronchospasm. Change IV steroids to rapid oral prednisone taper. 3. ESRD on MWF HD: Nephrology following. Underwent HD on 7/11. Clotted AVG: Status post declot revision by Dr. Darrick Penna 7/7 4. Chronic hypotension: Continue midodrine. 5. Chronic anemia: Related to chronic kidney disease. Stable. 6. Right heel ulcer: Wound care RN following. 7. Recent left BKA: By Dr. Lajoyce Corners. Wound healing issues. Seen by Dr. Lajoyce Corners recommending a revision of BKA to AKA with progressive gangrenous changes,  possibly outpatient when over pneumonia. Will discuss with Dr. Lajoyce Corners regarding need for continued antibiotics. 8. History of PAF: Patient has refused anticoagulation previously. 9. 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 10. Uncontrolled DM 2 with renal complications: Reduce steroids as indicated above. 11. Leukocytosis: Secondary to steroids. 12. Atypical chest pain: Minimally elevated troponin but flat trend. Likely due to ESRD. May have demand ischemia related to pneumonia. EKG showed LBBB. Chest pain resolved. 13. Hypothyroid: Continue Synthroid.   DVT prophylaxis: Heparin Code Status: DO NOT RESUSCITATE Family Communication: None at bedside Disposition: Not medically ready for discharge.   Consultants:  Nephrology Orthopedics  Vascular surgery  Procedures:  Hemodialysis  Antimicrobials:  IV cefepime and vancomycin    Subjective: Seen this morning by dialysis. Sleeping but easily arousable. Extremely hard of hearing and have to talk loudly into the right ear. Denies complaints. No pain reported.   ROS: Noted SBP in the 90s but patient asymptomatic of dizziness or lightheadedness.  Objective:  Vitals:   03/21/17 1100 03/21/17 1123 03/21/17 1250 03/21/17 1753  BP: 108/61 (!) 96/44 103/86 (!) 119/51  Pulse: 85 84 93 91  Resp:  18 18 18   Temp:  97.7 F (36.5 C) 98.3 F (36.8 C) (!) 97.5 F (36.4 C)  TempSrc:  Oral Oral Oral  SpO2:  100% 98% 100%  Weight:  103.1 kg (227 lb 4.7 oz)    Height:        Examination:  General exam: Pleasant middle-aged female lying comfortably supine in bed undergoing HD Respiratory system: Clear to auscultation. Respiratory effort normal. Cardiovascular system: S1 & S2 heard, RRR. No JVD, murmurs, rubs, gallops or  clicks. No pedal edema. Gastrointestinal system: Abdomen is nondistended, soft and nontender. No organomegaly or masses felt. Normal bowel sounds  heard. Central nervous system: Alert and oriented. No focal neurological deficits. Extremely hard of hearing. Extremities: Symmetric 5 x 5 power. Skin: Left BKA dressing clean and dry. Psychiatry: Judgement and insight appear normal. Mood & affect appropriate.     Data Reviewed: I have personally reviewed following labs and imaging studies  CBC:  Recent Labs Lab 03/17/17 0558  03/17/17 1930 03/18/17 0319 03/19/17 0539 03/20/17 0459 03/21/17 0356  WBC 10.0  --  8.9 8.6 10.3 12.3* 16.0*  NEUTROABS 6.8  --   --  6.5 9.5* 11.3* 14.0*  HGB 10.2*  < > 8.9* 8.7* 10.2* 10.0* 10.5*  HCT 33.7*  < > 29.0* 28.8* 33.0* 33.0* 35.8*  MCV 101.8*  --  100.7* 102.9* 103.8* 104.4* 104.1*  PLT 204  --  178 168 203 158 161  < > = values in this interval not displayed. Basic Metabolic Panel:  Recent Labs Lab 03/17/17 0558  03/17/17 1930 03/18/17 0319 03/19/17 0539 03/19/17 0815 03/20/17 0459 03/21/17 0356  NA 132*  < > 129* 132* 130*  --  133* 132*  132*  K 4.9  < > 4.9 3.7 5.2*  --  4.2 5.0  5.0  CL 96*  --  95* 97* 95*  --  97* 97*  98*  CO2 22  --  21* 20* 18*  --  25 19*  18*  GLUCOSE 113*  < > 206* 81 404* 372* 373* 269*  260*  BUN 37*  --  39* 15 34*  --  28* 52*  52*  CREATININE 6.99*  --  7.25* 3.56* 5.26*  --  3.68* 4.81*  4.75*  CALCIUM 9.3  --  8.6* 8.5* 9.8  --  10.0 9.7  9.6  MG 2.3  --   --  2.0 2.4  --  2.3 2.3  PHOS 5.4*  --  6.1* 2.8 5.1*  --  4.5 4.7*  4.6  < > = values in this interval not displayed. Liver Function Tests:  Recent Labs Lab 03/17/17 0558 03/17/17 1930 03/18/17 0319 03/19/17 0539 03/20/17 0459 03/21/17 0356  AST 19  --  22 24 15 22   ALT 8*  --  7* 10* 11* 10*  ALKPHOS 144*  --  124 136* 135* 124  BILITOT 0.7  --  0.9 0.9 0.6 1.0  PROT 8.1  --  7.3 8.1 8.2* 7.9  ALBUMIN 2.4* 2.1* 2.9* 3.0* 3.0* 3.0*  2.9*   Cardiac Enzymes:  Recent Labs Lab 03/14/17 1949 03/15/17 0220 03/15/17 0736  TROPONINI 0.21* 0.20* 0.24*    CBG:  Recent Labs Lab 03/20/17 1132 03/20/17 1712 03/20/17 2143 03/21/17 1238 03/21/17 1739  GLUCAP 416* 373* 332* 122* 257*    Recent Results (from the past 240 hour(s))  Blood culture (routine x 2)     Status: None   Collection Time: 03/14/17  9:38 PM  Result Value Ref Range Status   Specimen Description BLOOD LEFT ANTECUBITAL  Final   Special Requests IN PEDIATRIC BOTTLE Blood Culture adequate volume  Final   Culture NO GROWTH 5 DAYS  Final   Report Status 03/19/2017 FINAL  Final  Blood culture (routine x 2)     Status: Abnormal   Collection Time: 03/14/17  9:49 PM  Result Value Ref Range Status   Specimen Description BLOOD BLOOD LEFT FOREARM  Final   Special Requests IN  PEDIATRIC BOTTLE Blood Culture adequate volume  Final   Culture  Setup Time   Final    GRAM POSITIVE COCCI IN CLUSTERS IN PEDIATRIC BOTTLE CRITICAL RESULT CALLED TO, READ BACK BY AND VERIFIED WITH: J. LEDFORD, AT 1610 03/16/17 BY D. VANHOOK    Culture (A)  Final    STAPHYLOCOCCUS SPECIES (COAGULASE NEGATIVE) THE SIGNIFICANCE OF ISOLATING THIS ORGANISM FROM A SINGLE SET OF BLOOD CULTURES WHEN MULTIPLE SETS ARE DRAWN IS UNCERTAIN. PLEASE NOTIFY THE MICROBIOLOGY DEPARTMENT WITHIN ONE WEEK IF SPECIATION AND SENSITIVITIES ARE REQUIRED.    Report Status 03/17/2017 FINAL  Final  MRSA PCR Screening     Status: Abnormal   Collection Time: 03/15/17  9:41 AM  Result Value Ref Range Status   MRSA by PCR POSITIVE (A) NEGATIVE Final    Comment:        The GeneXpert MRSA Assay (FDA approved for NASAL specimens only), is one component of a comprehensive MRSA colonization surveillance program. It is not intended to diagnose MRSA infection nor to guide or monitor treatment for MRSA infections. CRITICAL RESULT CALLED TO, READ BACK BY AND VERIFIED WITH: C. YAP, RN AT 1210 ON 03/15/17 BY C. JESSUP, MLT.   Culture, blood (Routine X 2) w Reflex to ID Panel     Status: None (Preliminary result)   Collection Time:  03/18/17  3:19 AM  Result Value Ref Range Status   Specimen Description BLOOD LEFT HAND  Final   Special Requests IN PEDIATRIC BOTTLE Blood Culture adequate volume  Final   Culture NO GROWTH 3 DAYS  Final   Report Status PENDING  Incomplete  Culture, blood (Routine X 2) w Reflex to ID Panel     Status: None (Preliminary result)   Collection Time: 03/18/17  3:26 AM  Result Value Ref Range Status   Specimen Description BLOOD LEFT HAND  Final   Special Requests IN PEDIATRIC BOTTLE Blood Culture adequate volume  Final   Culture NO GROWTH 3 DAYS  Final   Report Status PENDING  Incomplete         Radiology Studies: Dg Chest 2 View  Result Date: 03/21/2017 CLINICAL DATA:  Pneumonia. EXAM: CHEST  2 VIEW COMPARISON:  03/18/2017, 03/16/2017, 03/14/2017 and 02/18/2017 FINDINGS: Interstitial infiltrates have cleared. The lungs are now clear. Heart size and pulmonary vascularity are normal. IMPRESSION: Resolution of pulmonary infiltrates.  No effusions. Electronically Signed   By: Francene Boyers M.D.   On: 03/21/2017 15:25        Scheduled Meds: . aspirin EC  81 mg Oral Daily  . budesonide (PULMICORT) nebulizer solution  0.25 mg Nebulization BID  . darbepoetin (ARANESP) injection - DIALYSIS  60 mcg Intravenous Q Mon-HD  . doxercalciferol  1 mcg Intravenous Q M,W,F-HD  . feeding supplement (PRO-STAT SUGAR FREE 64)  30 mL Oral BID  . heparin  5,000 Units Subcutaneous Q8H  . insulin aspart  0-20 Units Subcutaneous TID WC  . insulin aspart  0-5 Units Subcutaneous QHS  . insulin aspart  4 Units Subcutaneous TID WC  . insulin glargine  32 Units Subcutaneous BID  . levothyroxine  88 mcg Oral QAC breakfast  . lidocaine  1 patch Transdermal Q24H  . mouth rinse  15 mL Mouth Rinse BID  . methylPREDNISolone (SOLU-MEDROL) injection  40 mg Intravenous Daily  . midodrine  10 mg Oral Daily  . multivitamin with minerals  1 tablet Oral Daily  . sevelamer carbonate  2.4 g Oral TID WC  . silver  sulfADIAZINE  1 application Topical Daily   Continuous Infusions: . sodium chloride Stopped (03/17/17 0945)  . sodium chloride    . ceFEPime (MAXIPIME) IV       LOS: 7 days     Jazzma Neidhardt, MD, FACP, FHM. [facility] Pager [phone]  If 7PM-7AM, please contact night-coverage [email or web address] Password [email or web address] 03/21/2017, 6:18 PM

## 2017-03-21 NOTE — Progress Notes (Addendum)
Pharmacy Antibiotic Note  Kimberly [name] is a 61 y.o. female admitted on 03/14/2017 with pneumonia. Pharmacy has been consulted for cefepime dosing.  ESRD on HD- was off schedule d/t access being clotted. She did not miss doses of cefepime or vancomycin. Vancomycin has since been discontinued.  Plan: Cefepime 2g IV qMWF @ 2000- note 7/11 dose completes 8 total days which is normal treatment course for HCAP. Follow HD schedule/tolerance, LOT, clinical progression  Height: 5\' 4"  (162.6 cm) Weight: 231 lb 0.7 oz (104.8 kg) IBW/kg (Calculated) : 54.7  Temp (24hrs), Avg:98.1 F (36.7 C), Min:97.9 F (36.6 C), Max:98.3 F (36.8 C)   Recent Labs Lab 03/14/17 1925  03/17/17 1930 03/18/17 0319 03/19/17 0539 03/20/17 0459 03/21/17 0356  WBC  --   < > 8.9 8.6 10.3 12.3* 16.0*  CREATININE  --   < > 7.25* 3.56* 5.26* 3.68* 4.81*  4.75*  LATICACIDVEN 1.82  --   --   --   --   --   --   < > = values in this interval not displayed.  Estimated Creatinine Clearance: 14.3 mL/min (A) (by C-G formula based on SCr of 4.81 mg/dL (H)).    Allergies  Allergen Reactions  . Ciprofloxacin Itching  . Epinephrine Palpitations    Antimicrobials this admission: cefepime 7/4 >> vancomycin 7/4 >> 7/9  Dose adjustments this admission: 7/4: Vancomycin 2000mg  x1 7/9: cefepime 2g IV qMWF  Microbiology results: 7/4 Blood: 1/2 CoNS. BCID invalid >contaminant 7/5 MRSA PCR: pos 7/8 BCx: ngtd  Thank you for allowing pharmacy to be a part of this patient's care.  Ladesha Pacini D. Creta Dorame, PharmD, BCPS Clinical Pharmacist Pager: [phone] Clinical Phone for 03/21/2017 until 3:30pm: [phone] If after 3:30pm, please call main pharmacy at [phone] 03/21/2017 10:56 AM

## 2017-03-21 NOTE — Progress Notes (Signed)
Inpatient Diabetes Program Recommendations  AACE/ADA: New Consensus Statement on Inpatient Glycemic Control (2015)  Target Ranges:  Prepandial:   less than 140 mg/dL      Peak postprandial:   less than 180 mg/dL (1-2 hours)      Critically ill patients:  140 - 180 mg/dL   Results for Kimberly [name], Lilac K (MRN [id number]) as of 03/21/2017 10:51  Ref. Range 03/21/2017 03:56  Glucose Latest Ref Range: 65 - 99 mg/dL 409269 (H)   Results for Kimberly [name], Argelia K (MRN [id number]) as of 03/21/2017 10:51  Ref. Range 03/20/2017 08:09 03/20/2017 11:32 03/20/2017 17:12 03/20/2017 21:43  Glucose-Capillary Latest Ref Range: 65 - 99 mg/dL 956341 (H) 213416 (H) 086373 (H) 332 (H)   Review of Glycemic Control  Diabetes history: DM2 Outpatient Diabetes medications: Lantus 28 units BID, Novolog 0-9 units TID Current orders for Inpatient glycemic control: Lantus 28 units BID, Novolog 0-20 units TID with meals, Novolog 0-5 units QHS  Inpatient Diabetes Program Recommendations: Insulin - Basal: If steroids are continued, please consider increasing Lantus to 34 units BID. Insulin - Meal Coverage: If steroids are continued, please consider increasing meal coverage to Novolog 6 units TID with meals.  Thanks, Orlando PennerMarie Tatym Schermer, RN, MSN, CDE Diabetes Coordinator Inpatient Diabetes Program [phone] (Team Pager from 8am to 5pm)

## 2017-03-22 ENCOUNTER — Ambulatory Visit (INDEPENDENT_AMBULATORY_CARE_PROVIDER_SITE_OTHER): Payer: Medicare Other | Admitting: Orthopedic Surgery

## 2017-03-22 DIAGNOSIS — J441 Chronic obstructive pulmonary disease with (acute) exacerbation: Secondary | ICD-10-CM

## 2017-03-22 LAB — GLUCOSE, CAPILLARY
GLUCOSE-CAPILLARY: 189 mg/dL — AB (ref 65–99)
GLUCOSE-CAPILLARY: 393 mg/dL — AB (ref 65–99)
Glucose-Capillary: 269 mg/dL — ABNORMAL HIGH (ref 65–99)
Glucose-Capillary: 404 mg/dL — ABNORMAL HIGH (ref 65–99)

## 2017-03-22 MED ORDER — IPRATROPIUM-ALBUTEROL 0.5-2.5 (3) MG/3ML IN SOLN
3.0000 mL | Freq: Two times a day (BID) | RESPIRATORY_TRACT | Status: DC
  Administered 2017-03-22: 3 mL via RESPIRATORY_TRACT
  Filled 2017-03-22 (×2): qty 3

## 2017-03-22 MED ORDER — PANTOPRAZOLE SODIUM 40 MG PO TBEC
40.0000 mg | DELAYED_RELEASE_TABLET | Freq: Two times a day (BID) | ORAL | Status: DC
  Filled 2017-03-22: qty 1

## 2017-03-22 NOTE — [place] FL2 (Signed)
[place] MEDICAID FL2 LEVEL OF CARE SCREENING TOOL     IDENTIFICATION  Patient Name: Kimberly [name] Birthdate: 1956-03-28 Sex: female Admission Date (Current Location): 03/14/2017  [facility] and [place] Number:  Producer, television/film/video and Address:  [facility] [place]. [facility], [address] [place], [place] [address]      Provider Number: [id number]  Attending Physician Name and Address:  Elease Etienne, MD  Relative Name and Phone Number:       Current Level of Care: Hospital Recommended Level of Care: Skilled Nursing Facility Prior Approval Number:    Date Approved/Denied:   PASRR Number: [id number]  Discharge Plan: SNF    Current Diagnoses: Patient Active Problem List   Diagnosis Date Noted  . Dehiscence of amputation stump (HCC)   . Pneumonia 03/14/2017  . Hx of BKA, left (HCC) 02/27/2017  . Gangrene of left foot (HCC)   . Congestive heart failure (CHF) (HCC)   . Diabetes mellitus with complication (HCC)   . Sepsis (HCC) 02/14/2017  . Diabetes mellitus, type II, insulin dependent (HCC)   . HCAP (healthcare-associated pneumonia) 09/11/2016  . Pressure injury of skin 08/29/2016  . Chest pain 08/28/2016  . Elevated troponin 08/28/2016  . 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 Hyperkalemia 08/23/2016  . Shortness of breath 08/23/2016  . Hyponatremia 08/23/2016  . Hypotension arterial 09/30/2015  . LBBB (left bundle branch block) 09/30/2015  . Complex endometrial hyperplasia with atypia 09/02/2015  . Severe obesity (HCC) 07/29/2015  . Hypotension 07/20/2015  . Uncontrolled type 2 diabetes mellitus (HCC) 07/20/2015  . Vaginal bleeding   . Altered mental status   . Pulmonary arterial hypertension (HCC)   . Paroxysmal atrial fibrillation (HCC) 07/11/2015  . ESRD (end stage renal disease) (HCC) 07/08/2015  . Pressure ulcer 04/07/2015  . Obesity hypoventilation syndrome (HCC) 08/05/2014  . Morbid obesity (HCC) 06/28/2014  . Physical deconditioning 06/28/2014  .  Hypothyroidism 06/10/2014  . Carotid stenosis 12/02/2013  . Varicose veins of lower extremities with other complications 12/02/2013  . Chronic respiratory failure with hypoxia (HCC) 10/13/2013  . On home oxygen therapy 10/13/2013  . Chronic diastolic CHF (congestive heart failure) (HCC) 09/12/2013  . CAD (coronary artery disease), native coronary artery   . DM (diabetes mellitus), type 2 with renal complications (HCC) 05/31/2010  . Hyperlipidemia associated with type 2 diabetes mellitus (HCC) 05/31/2010  . COPD (chronic obstructive pulmonary disease) (HCC) 05/31/2010  . Hypertensive heart disease     Orientation RESPIRATION BLADDER Height & Weight     Self, Time, Situation, Place  O2 ([place] 2L) Continent Weight: 233 lb 11 oz (106 kg) Height:  5\' 4"  (162.6 cm)  BEHAVIORAL SYMPTOMS/MOOD NEUROLOGICAL BOWEL NUTRITION STATUS      Incontinent Diet (puree)  AMBULATORY STATUS COMMUNICATION OF NEEDS Skin   Extensive Assist Verbally PU Stage and Appropriate Care, Skin abrasions PU Stage 1 Dressing:  (unknown)                     Personal Care Assistance Level of Assistance  Bathing, Dressing Bathing Assistance: Maximum assistance   Dressing Assistance: Maximum assistance     Functional Limitations Info  Hearing, Sight Sight Info: Impaired Hearing Info: Impaired      SPECIAL CARE FACTORS FREQUENCY  PT (By licensed PT), OT (By licensed OT)     PT Frequency: 5x/wk OT Frequency: 5x/wk            Contractures      Additional Factors Info  Code Status, Allergies, Insulin Sliding Scale, Isolation Precautions Code Status Info: DNR Allergies Info: Ciprofloxacin, Epinephrine   Insulin Sliding Scale Info: 3x/day Isolation Precautions Info: Contact precautions, MRSA     Current Medications (03/22/2017):  This is the current hospital active medication list Current Facility-Administered Medications  Medication Dose Route Frequency Provider Last Rate Last Dose  . 0.9 %  sodium  chloride infusion  100 mL Intravenous PRN Weston Settle, PA-C   Stopped at 03/17/17 0945  . 0.9 %  sodium chloride infusion  100 mL Intravenous PRN Weston Settle, PA-C      . acetaminophen (TYLENOL) tablet 650 mg  650 mg Oral Q6H PRN Eduard Clos, MD   650 mg at 03/15/17 0548   Or  . acetaminophen (TYLENOL) suppository 650 mg  650 mg Rectal Q6H PRN Eduard Clos, MD      . aspirin EC tablet 81 mg  81 mg Oral Daily Eduard Clos, MD   81 mg at 03/22/17 1131  . budesonide (PULMICORT) nebulizer solution 0.25 mg  0.25 mg Nebulization BID Eduard Clos, MD   0.25 mg at 03/22/17 0926  . Darbepoetin Alfa (ARANESP) injection 60 mcg  60 mcg Intravenous Q Mon-HD Delano Metz, MD   60 mcg at 03/19/17 [phone]  . doxercalciferol (HECTOROL) injection 1 mcg  1 mcg Intravenous Q M,W,F-HD Sheikh, Kateri [place], DO   1 mcg at 03/21/17 1053  . feeding supplement (PRO-STAT SUGAR FREE 64) liquid 30 mL  30 mL Oral BID Weston Settle, PA-C   30 mL at 03/21/17 2214  . guaiFENesin-dextromethorphan (ROBITUSSIN DM) 100-10 MG/5ML syrup 15 mL  15 mL Oral Q6H PRN Schorr, Roma Kayser, NP      . heparin injection 5,000 Units  5,000 Units Subcutaneous Q8H Eduard Clos, MD   5,000 Units at 03/22/17 0553  . insulin aspart (novoLOG) injection 0-20 Units  0-20 Units Subcutaneous TID WC Marguerita Merles [place], DO   4 Units at 03/22/17 [phone]  . insulin aspart (novoLOG) injection 0-5 Units  0-5 Units Subcutaneous QHS Marguerita Merles [place], [place]   5 Units at 03/21/17 2215  . insulin aspart (novoLOG) injection 6 Units  6 Units Subcutaneous TID WC Elease Etienne, MD   6 Units at 03/22/17 [phone]  . insulin glargine (LANTUS) injection 32 Units  32 Units Subcutaneous BID Marguerita Merles [place], [place]   32 Units at 03/22/17 1126  . ipratropium-albuterol (DUONEB) 0.5-2.5 (3) MG/3ML nebulizer solution 3 mL  3 mL Nebulization Q4H PRN Marguerita Merles Latif, DO   3 mL at 03/22/17 0931  . ipratropium-albuterol (DUONEB) 0.5-2.5  (3) MG/3ML nebulizer solution 3 mL  3 mL Nebulization BID Hongalgi, Anand D, MD      . levothyroxine (SYNTHROID, LEVOTHROID) tablet 88 mcg  88 mcg Oral QAC breakfast Eduard Clos, MD   88 mcg at 03/22/17 0835  . lidocaine (LIDODERM) 5 % 1 patch  1 patch Transdermal Q24H Sheikh, Omair [place], DO      . LORazepam (ATIVAN) tablet 0.5 mg  0.5 mg Oral Q6H PRN Opyd, Lavone Neri, MD   0.5 mg at 03/20/17 2251  . MEDLINE mouth rinse  15 mL Mouth Rinse BID Marguerita Merles Latif, DO   15 mL at 03/22/17 1131  . midodrine (PROAMATINE) tablet 10 mg  10 mg Oral Daily Eduard Clos, MD   10 mg at 03/22/17 1125  . multivitamin with minerals tablet 1 tablet  1 tablet Oral Daily Eduard Clos, MD  1 tablet at 03/22/17 1125  . ondansetron (ZOFRAN) tablet 4 mg  4 mg Oral Q6H PRN Eduard ClosKakrakandy, Arshad N, MD       Or  . ondansetron [facility](ZOFRAN) injection 4 mg  4 mg Intravenous Q6H PRN Eduard ClosKakrakandy, Arshad N, MD      . oxyCODONE-acetaminophen (PERCOCET/ROXICET) 5-325 MG per tablet 1 tablet  1 tablet Oral Q4H PRN Sherren KernsFields, Charles E, MD   1 tablet at 03/20/17 2248  . polyethylene glycol (MIRALAX / GLYCOLAX) packet 17 g  17 g Oral Daily PRN Eduard ClosKakrakandy, Arshad N, MD      . predniSONE (DELTASONE) tablet 30 mg  30 mg Oral Q breakfast Elease EtienneHongalgi, Anand D, MD   30 mg at 03/22/17 0835  . senna (SENOKOT) tablet 17.2 mg  2 tablet Oral QHS PRN Eduard ClosKakrakandy, Arshad N, MD   17.2 mg at 03/20/17 2247  . sevelamer carbonate (RENVELA) powder PACK 2.4 g  2.4 g Oral TID WC Eduard ClosKakrakandy, Arshad N, MD   2.4 g at 03/22/17 0836  . silver sulfADIAZINE (SILVADENE) 1 % cream 1 application  1 application Topical Daily Eduard ClosKakrakandy, Arshad N, MD   1 application at 03/22/17 1127  . traMADol (ULTRAM) tablet 50 mg  50 mg Oral Q6H PRN Marguerita MerlesSheikh, Omair Latif, DO   50 mg at 03/21/17 1301     Discharge Medications: Please see discharge summary for a list of discharge medications.  Relevant Imaging Results:  Relevant Lab Results:   Additional  Information SS#: [id number]  Baldemar LenisElizabeth M Akiya Morr, [name]

## 2017-03-22 NOTE — Progress Notes (Signed)
Patient ID: Galen ManilaSusan K Doughten, female   DOB: 03-25-1956, 61 y.o.   MRN: [id number] Patient is alert and oriented this morning. Patient states that her right lower extremity is going to undergo vascular evaluation for limb salvage. Patient is seen in follow-up for the gangrenous changes to the left transtibial amputation.  Examination the left transtibial amputation actually has improved. The gangrenous ulcers have dried there is no cellulitis no purulence no odor no drainage. Patient has shown interval improvement from last examination. Would recommend continuing with her dressing changes as an outpatient I will follow-up in the office in 2 weeks and plan for either revision transtibial amputation or above-knee amputation. Right now patient's soft tissue envelope is encouraging for revision transtibial amputation.

## 2017-03-22 NOTE — Progress Notes (Signed)
[facility] Progress Note  Dialysis Orders:MWF [facility] 4.5 hours 2 K 2 Ca 400/800 EDW 104 right upper AVGG heparin 6000 load and 5000 mid tmt venofer 100 through 7/11 hectorol 1 mircera 150 - last 6/7  Assessment/Plan: 1. ? HCAP plusexacerbation of COPD - s/p antibiotics Repeat CXR 7/9post HD improving pul edema/infiltrates; Positive blood culture - BC 7/4 +Coag neg staph in one bottle - felt to be contaminant.Repeat BC 7/8 pending- no growth- suspect sx now are more COPD related w bronchspasm. Clear CXR 7/9 and 7/11, not fluid overloaded 2. ESRD - MWF next HD Friday first round 3. Clotted AVG - s/p declot revision with Dr. Darrick Penna 7/7. Appreciate VVS assistance. Watch prox incision site near axilla; seemed to have some prolong bleeding at distal needle site post HD Wed 4. BP/volume - chronic low BP - midodrine for BP support - prone to high IDWG but intake much more restricted here. Net UF 3L Post HD wt 103.9kg Net UF 1.5 Monday with post wt 102.5- 1.7 Wed post wt 103.1 -continue to challenge as BP allows 5. Anemia - Hgb 10.5    - StartedAranesp 100 q Monday 7/9Holding Fe course with infection- can resume at d/c to make up previous doses  6. Metabolic bone disease - Continue hectorol/binders- Calcium borderline high -only on 1 hectorol continue for now 7. Nutrition - D1 diet with thin liquids- multivit; doesn't like oral supplements alb 3 - added prostat 8. Recent left BKA - Dr. Lajoyce Corners - with wound healing issues - seen by Dr. Lajoyce Corners recommending revision of BKA to AKA with progressive gangrenous changes; per primary, Dr. Lajoyce Corners wishes to address as outpatient 9. Right heel ulcer - wound care RN following; I doubt this will heal 10. DNR 11. MRSA contact precautions 12. Hx of PAF  13. 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 DM - BS high made worse by steroids with associated hyponatremia,  though some may be volume related 15. Leukocytosis - WBC increasing but started IV steroids 7/8- afebrile 16. Disp - d/c soon back to SNF   Sheffield Slider, PA-C [facility] Beeper [phone] 03/22/2017,10:08 AM  LOS: 8 days   Pt seen, examined and agree w A/P as above. [name] MD [employer] pager [phone]   03/22/2017, 1:25 PM    Subjective:  Periodic coughing. Eating well  Objective Vitals:   03/22/17 0300 03/22/17 0831 03/22/17 0926 03/22/17 0931  BP: (!) 114/50 (!) 151/15    Pulse: 88 91    Resp: 18 18    Temp: 98.6 F (37 C) 98.6 F (37 C)    TempSrc: Oral Oral    SpO2: 100% 100% 100% 100%  Weight:      Height:       Physical Exam General: NAD alert and engaged in conversation about her care Heart: RRR Lungs: wheezes - esp exp, coarse BS  Abdomen:obese soft Extremities: left BKA wrapped right LE 1 + edema Dialysis Access:  Right upper AVGG - prox incision site, some serosanguinous drainage + bruit   Additional Objective Labs: Basic Metabolic Panel:  Recent Labs Lab 03/19/17 0539 03/19/17 0815 03/20/17 0459 03/21/17 0356  NA 130*  --  133* 132*  132*  K 5.2*  --  4.2 5.0  5.0  CL 95*  --  97* 97*  98*  CO2 18*  --  25 19*  18*  GLUCOSE 404*  372* 373* 269*  260*  BUN 34*  --  28* 52*  52*  CREATININE 5.26*  --  3.68* 4.81*  4.75*  CALCIUM 9.8  --  10.0 9.7  9.6  PHOS 5.1*  --  4.5 4.7*  4.6   Liver Function Tests:  Recent Labs Lab 03/19/17 0539 03/20/17 0459 03/21/17 0356  AST 24 15 22   ALT 10* 11* 10*  ALKPHOS 136* 135* 124  BILITOT 0.9 0.6 1.0  PROT 8.1 8.2* 7.9  ALBUMIN 3.0* 3.0* 3.0*  2.9*   CBC:  Recent Labs Lab 03/17/17 1930 03/18/17 0319 03/19/17 0539 03/20/17 0459 03/21/17 0356  WBC 8.9 8.6 10.3 12.3* 16.0*  NEUTROABS  --  6.5 9.5* 11.3* 14.0*  HGB 8.9* 8.7* 10.2* 10.0* 10.5*  HCT 29.0* 28.8* 33.0* 33.0* 35.8*  MCV 100.7* 102.9* 103.8* 104.4* 104.1*  PLT 178 168 203  158 161   Blood Culture    Component Value Date/Time   SDES BLOOD LEFT HAND 03/18/2017 0326   SPECREQUEST IN PEDIATRIC BOTTLE Blood Culture adequate volume 03/18/2017 0326   CULT NO GROWTH 3 DAYS 03/18/2017 0326   REPTSTATUS PENDING 03/18/2017 0326   CBG:  Recent Labs Lab 03/20/17 2143 03/21/17 1238 03/21/17 1739 03/21/17 2141 03/22/17 0809  GLUCAP 332* 122* 257* 388* 189*   Iron Studies: No results for input(s): IRON, TIBC, TRANSFERRIN, FERRITIN in the last 72 hours. Lab Results  Component Value Date   INR 1.33 02/15/2017   INR 1.33 07/20/2015   INR 1.35 07/19/2015   Studies/Results: Dg Chest 2 View  Result Date: 03/21/2017 CLINICAL DATA:  Pneumonia. EXAM: CHEST  2 VIEW COMPARISON:  03/18/2017, 03/16/2017, 03/14/2017 and 02/18/2017 FINDINGS: Interstitial infiltrates have cleared. The lungs are now clear. Heart size and pulmonary vascularity are normal. IMPRESSION: Resolution of pulmonary infiltrates.  No effusions. Electronically Signed   By: Francene BoyersJames  Maxwell M.D.   On: 03/21/2017 15:25   Medications: . sodium chloride Stopped (03/17/17 0945)  . sodium chloride     . aspirin EC  81 mg Oral Daily  . budesonide (PULMICORT) nebulizer solution  0.25 mg Nebulization BID  . darbepoetin (ARANESP) injection - DIALYSIS  60 mcg Intravenous Q Mon-HD  . doxercalciferol  1 mcg Intravenous Q M,W,F-HD  . feeding supplement (PRO-STAT SUGAR FREE 64)  30 mL Oral BID  . heparin  5,000 Units Subcutaneous Q8H  . insulin aspart  0-20 Units Subcutaneous TID WC  . insulin aspart  0-5 Units Subcutaneous QHS  . insulin aspart  6 Units Subcutaneous TID WC  . insulin glargine  32 Units Subcutaneous BID  . levothyroxine  88 mcg Oral QAC breakfast  . lidocaine  1 patch Transdermal Q24H  . mouth rinse  15 mL Mouth Rinse BID  . midodrine  10 mg Oral Daily  . multivitamin with minerals  1 tablet Oral Daily  . predniSONE  30 mg Oral Q breakfast  . sevelamer carbonate  2.4 g Oral TID WC  . silver  sulfADIAZINE  1 application Topical Daily

## 2017-03-22 NOTE — Care Management Important Message (Signed)
Important Message  Patient Details  Name: Kimberly [name] MRN: [id number] Date of Birth: 12-05-1955   Medicare Important Message Given:  Yes    [name], [name], RN 03/22/2017, 2:36 PM

## 2017-03-22 NOTE — Clinical Social Work Note (Signed)
Clinical Social Work Assessment  Patient Details  Name: Kimberly ManilaSusan K Postlewait MRN: [id number] Date of Birth: 10/15/1955  Date of referral:  03/22/17               Reason for consult:  Facility Placement, Discharge Planning                Permission sought to share information with:  Oceanographeracility Contact Representative Permission granted to share information::  Yes, Verbal Permission Granted  Name::        Agency::  [facility]  Relationship::     Contact Information:     Housing/Transportation Living arrangements for the past 2 months:  [facility] Source of Information:  Patient Patient Interpreter Needed:  None Criminal Activity/Legal Involvement Pertinent to Current Situation/Hospitalization:  No - Comment as needed Significant Relationships:  Spouse, Siblings Lives with:  Self Do you feel safe going back to the place where you live?  Yes Need for family participation in patient care:  No (Coment)  Care giving concerns:  [facility] and has no concerns about care giving received there.    Social Worker assessment / plan:  CSW introduced self to patient and explained role. CSW wrote down information on paper for the patient to read in order to communicate, as patient is deaf in one ear and hard of hearing in the other; patient indicated that she could not hear CSW when speaking. Patient confirmed that she would be going back [facility] when medically ready to discharge. Patient expressed concern about her inability to breathe and that she was hopeful that the doctors would have more information for her before she leaves the hospital. CSW contacted representative from [facility] to confirm that the patient would be able to return. CSW will follow to facilitate discharge planning.  Employment status:  Retired Health and safety inspectornsurance information:  Medicare PT Recommendations:  Skilled Nursing Facility Information / Referral to community resources:     Patient/Family's  Response to care:  Patient agreeable to return to SNF.  Patient/Family's Understanding of and Emotional Response to Diagnosis, Current Treatment, and Prognosis:  Patient seems to understand need to return to SNF as well as CSW role in discharge planning.  Emotional Assessment Appearance:  Appears stated age Attitude/Demeanor/Rapport:    Affect (typically observed):  Appropriate Orientation:  Oriented to Situation, Oriented to  Time, Oriented to Place, Oriented to Self Alcohol / Substance use:  Not Applicable Psych involvement (Current and /or in the community):  No (Comment)  Discharge Needs  Concerns to be addressed:  Care Coordination, Discharge Planning Concerns Readmission within the last 30 days:  Yes Current discharge risk:  Physical Impairment Barriers to Discharge:  Continued Medical Work up   Dollar GeneralElizabeth M Flo Berroa, [name] 03/22/2017, 3:57 PM

## 2017-03-22 NOTE — Progress Notes (Signed)
PROGRESS NOTE   Kimberly ManilaSusan K [name]  ZOX:[id number]    DOB: [birth year]    DOA: 03/14/2017  PCP: [name], [name], MD   I have briefly reviewed patients [facility].  Brief Narrative:  61 year old female with PMH of ESRD on MWF HD, CAD, chronic diastolic CHF, COPD on home oxygen, anxiety & depression, GERD, HLD, HTN, hypothyroid, DM 2 with peripheral neuropathy, stroke, recent left BKA, presented with productive cough, wheezing, retrosternal nonradiating chest pain, postnasal drainage and admitted for possible HCAP and COPD exacerbation. Nephrology consulting for dialysis needs.   Assessment & Plan:   Principal Problem:   HCAP (healthcare-associated pneumonia) Active Problems:   CAD (coronary artery disease), native coronary artery   COPD (chronic obstructive pulmonary disease) (HCC)   Hypothyroidism   Paroxysmal atrial fibrillation (HCC)   Diabetes mellitus with complication (HCC)   Pneumonia   Dehiscence of amputation stump (HCC)   1. Suspected healthcare associated pneumonia: One of 2 blood cultures from 7/4 showed coagulase-negative staph, likely contaminant. The other one was negative, final report. Blood cultures 2 from 7/8: Negative to date. As per pharmacy, completed 8 days of IV cefepime on 7/11 and discontinued. Completed treatment. Repeat chest x-ray 7/11 shows resolution of pulmonary infiltrates. 2. COPD exacerbation: Precipitated by possible pneumonia. No clinical bronchospasm. Changed IV steroids to rapid oral prednisone taper.? VC causing wheezing. Add PPI. 3. ESRD on MWF HD: Nephrology following. Underwent HD on 7/11. Clotted AVG: Status post declot revision by Dr. Darrick PennaFields 7/7 4. Chronic hypotension: Continue midodrine. Stable. 5. Chronic anemia: Related to chronic kidney disease. Stable. 6. Right heel ulcer: Wound care RN following. 7. Recent left BKA: By Dr. Lajoyce Cornersuda. Wound healing issues. Seen by Dr. Lajoyce Cornersuda recommending a revision of BKA to AKA with  progressive gangrenous changes, possibly outpatient when over pneumonia. Discussed with Dr. Lajoyce Cornersuda who will see her on 7/13 but plans outpatient surgery. 8. History of PAF: Patient has refused anticoagulation previously. 9. 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 10. Uncontrolled DM 2 with renal complications: Reduced steroids as indicated above. CBG starting to improve. 11. Leukocytosis: Secondary to steroids. 12. Atypical chest pain: Minimally elevated troponin but flat trend. Likely due to ESRD. May have demand ischemia related to pneumonia. EKG showed LBBB. Chest pain resolved. 13. Hypothyroid: Continue Synthroid.   DVT prophylaxis: Heparin Code Status: DO NOT RESUSCITATE Family Communication: None at bedside Disposition: DC to SNF when medically improved, possibly in the next 1-2 days.   Consultants:  Nephrology Orthopedics  Vascular surgery  Procedures:  Hemodialysis  Antimicrobials:  IV cefepime and vancomycin -completed course and discontinued   Subjective: Some intermittent wheezing and dyspnea. No other complaints reported.  ROS: Denies chest pain.  Objective:  Vitals:   03/22/17 0300 03/22/17 0831 03/22/17 0926 03/22/17 0931  BP: (!) 114/50 (!) 151/15    Pulse: 88 91    Resp: 18 18    Temp: 98.6 F (37 C) 98.6 F (37 C)    TempSrc: Oral Oral    SpO2: 100% 100% 100% 100%  Weight:      Height:        Examination:  General exam: Pleasant middle-aged female lying comfortably propped up in bed Respiratory system: Slightly harsh breath sounds bilaterally with scattered few wheezing? From upper airway. No crackles. Respiratory effort normal. Cardiovascular system: S1 & S2 heard, RRR. No JVD, murmurs, rubs, gallops or clicks. No pedal edema. Telemetry: Sinus  rhythm with BBB morphology. Gastrointestinal system: Abdomen is nondistended, soft and nontender. No organomegaly or masses felt. Normal  bowel sounds heard. Central nervous system: Alert and oriented. No focal neurological deficits. Extremely hard of hearing in left ear. Able to hear when spoken into right ear. Extremities: Symmetric 5 x 5 power. Skin: Left BKA dressing clean and dry. Right heel ulcer with dark eschar. Psychiatry: Judgement and insight appear normal. Mood & affect appropriate.     Data Reviewed: I have personally reviewed following labs and imaging studies  CBC:  Recent Labs Lab 03/17/17 0558  03/17/17 1930 03/18/17 0319 03/19/17 0539 03/20/17 0459 03/21/17 0356  WBC 10.0  --  8.9 8.6 10.3 12.3* 16.0*  NEUTROABS 6.8  --   --  6.5 9.5* 11.3* 14.0*  HGB 10.2*  < > 8.9* 8.7* 10.2* 10.0* 10.5*  HCT 33.7*  < > 29.0* 28.8* 33.0* 33.0* 35.8*  MCV 101.8*  --  100.7* 102.9* 103.8* 104.4* 104.1*  PLT 204  --  178 168 203 158 161  < > = values in this interval not displayed. Basic Metabolic Panel:  Recent Labs Lab 03/17/17 0558  03/17/17 1930 03/18/17 0319 03/19/17 0539 03/19/17 0815 03/20/17 0459 03/21/17 0356  NA 132*  < > 129* 132* 130*  --  133* 132*  132*  K 4.9  < > 4.9 3.7 5.2*  --  4.2 5.0  5.0  CL 96*  --  95* 97* 95*  --  97* 97*  98*  CO2 22  --  21* 20* 18*  --  25 19*  18*  GLUCOSE 113*  < > 206* 81 404* 372* 373* 269*  260*  BUN 37*  --  39* 15 34*  --  28* 52*  52*  CREATININE 6.99*  --  7.25* 3.56* 5.26*  --  3.68* 4.81*  4.75*  CALCIUM 9.3  --  8.6* 8.5* 9.8  --  10.0 9.7  9.6  MG 2.3  --   --  2.0 2.4  --  2.3 2.3  PHOS 5.4*  --  6.1* 2.8 5.1*  --  4.5 4.7*  4.6  < > = values in this interval not displayed. Liver Function Tests:  Recent Labs Lab 03/17/17 0558 03/17/17 1930 03/18/17 0319 03/19/17 0539 03/20/17 0459 03/21/17 0356  AST 19  --  22 24 15 22   ALT 8*  --  7* 10* 11* 10*  ALKPHOS 144*  --  124 136* 135* 124  BILITOT 0.7  --  0.9 0.9 0.6 1.0  PROT 8.1  --  7.3 8.1 8.2* 7.9  ALBUMIN 2.4* 2.1* 2.9* 3.0* 3.0* 3.0*  2.9*   Cardiac Enzymes: No  results for input(s): CKTOTAL, CKMB, CKMBINDEX, TROPONINI in the last 168 hours. CBG:  Recent Labs Lab 03/21/17 1238 03/21/17 1739 03/21/17 2141 03/22/17 0809 03/22/17 1142  GLUCAP 122* 257* 388* 189* 269*    Recent Results (from the past 240 hour(s))  Blood culture (routine x 2)     Status: None   Collection Time: 03/14/17  9:38 PM  Result Value Ref Range Status   Specimen Description BLOOD LEFT ANTECUBITAL  Final   Special Requests IN PEDIATRIC BOTTLE Blood Culture adequate volume  Final   Culture NO GROWTH 5 DAYS  Final   Report Status 03/19/2017 FINAL  Final  Blood culture (routine x 2)     Status: Abnormal   Collection Time: 03/14/17  9:49 PM  Result Value Ref Range Status  Specimen Description BLOOD BLOOD LEFT FOREARM  Final   Special Requests IN PEDIATRIC BOTTLE Blood Culture adequate volume  Final   Culture  Setup Time   Final    GRAM POSITIVE COCCI IN CLUSTERS IN PEDIATRIC BOTTLE CRITICAL RESULT CALLED TO, READ BACK BY AND VERIFIED WITH: J. LEDFORD, AT 0981 03/16/17 BY D. VANHOOK    Culture (A)  Final    STAPHYLOCOCCUS SPECIES (COAGULASE NEGATIVE) THE SIGNIFICANCE OF ISOLATING THIS ORGANISM FROM A SINGLE SET OF BLOOD CULTURES WHEN MULTIPLE SETS ARE DRAWN IS UNCERTAIN. PLEASE NOTIFY THE MICROBIOLOGY DEPARTMENT WITHIN ONE WEEK IF SPECIATION AND SENSITIVITIES ARE REQUIRED.    Report Status 03/17/2017 FINAL  Final  MRSA PCR Screening     Status: Abnormal   Collection Time: 03/15/17  9:41 AM  Result Value Ref Range Status   MRSA by PCR POSITIVE (A) NEGATIVE Final    Comment:        The GeneXpert MRSA Assay (FDA approved for NASAL specimens only), is one component of a comprehensive MRSA colonization surveillance program. It is not intended to diagnose MRSA infection nor to guide or monitor treatment for MRSA infections. CRITICAL RESULT CALLED TO, READ BACK BY AND VERIFIED WITH: C. YAP, RN AT 1210 ON 03/15/17 BY C. JESSUP, MLT.   Culture, blood (Routine X 2) w  Reflex to ID Panel     Status: None (Preliminary result)   Collection Time: 03/18/17  3:19 AM  Result Value Ref Range Status   Specimen Description BLOOD LEFT HAND  Final   Special Requests IN PEDIATRIC BOTTLE Blood Culture adequate volume  Final   Culture NO GROWTH 4 DAYS  Final   Report Status PENDING  Incomplete  Culture, blood (Routine X 2) w Reflex to ID Panel     Status: None (Preliminary result)   Collection Time: 03/18/17  3:26 AM  Result Value Ref Range Status   Specimen Description BLOOD LEFT HAND  Final   Special Requests IN PEDIATRIC BOTTLE Blood Culture adequate volume  Final   Culture NO GROWTH 4 DAYS  Final   Report Status PENDING  Incomplete         Radiology Studies: Dg Chest 2 View  Result Date: 03/21/2017 CLINICAL DATA:  Pneumonia. EXAM: CHEST  2 VIEW COMPARISON:  03/18/2017, 03/16/2017, 03/14/2017 and 02/18/2017 FINDINGS: Interstitial infiltrates have cleared. The lungs are now clear. Heart size and pulmonary vascularity are normal. IMPRESSION: Resolution of pulmonary infiltrates.  No effusions. Electronically Signed   By: Francene Boyers M.D.   On: 03/21/2017 15:25        Scheduled Meds: . aspirin EC  81 mg Oral Daily  . budesonide (PULMICORT) nebulizer solution  0.25 mg Nebulization BID  . darbepoetin (ARANESP) injection - DIALYSIS  60 mcg Intravenous Q Mon-HD  . doxercalciferol  1 mcg Intravenous Q M,W,F-HD  . feeding supplement (PRO-STAT SUGAR FREE 64)  30 mL Oral BID  . heparin  5,000 Units Subcutaneous Q8H  . insulin aspart  0-20 Units Subcutaneous TID WC  . insulin aspart  0-5 Units Subcutaneous QHS  . insulin aspart  6 Units Subcutaneous TID WC  . insulin glargine  32 Units Subcutaneous BID  . ipratropium-albuterol  3 mL Nebulization BID  . levothyroxine  88 mcg Oral QAC breakfast  . lidocaine  1 patch Transdermal Q24H  . mouth rinse  15 mL Mouth Rinse BID  . midodrine  10 mg Oral Daily  . multivitamin with minerals  1 tablet Oral Daily  .  predniSONE  30 mg Oral Q breakfast  . sevelamer carbonate  2.4 g Oral TID WC  . silver sulfADIAZINE  1 application Topical Daily   Continuous Infusions: . sodium chloride Stopped (03/17/17 0945)  . sodium chloride       LOS: 8 days     Maahi Lannan, MD, FACP, FHM. [facility] Pager [phone]  If 7PM-7AM, please contact night-coverage [email or web address] Password [facility] 03/22/2017, 3:41 PM

## 2017-03-23 DIAGNOSIS — F419 Anxiety disorder, unspecified: Secondary | ICD-10-CM | POA: Diagnosis present

## 2017-03-23 DIAGNOSIS — E11621 Type 2 diabetes mellitus with foot ulcer: Secondary | ICD-10-CM | POA: Diagnosis present

## 2017-03-23 DIAGNOSIS — I2729 Other secondary pulmonary hypertension: Secondary | ICD-10-CM | POA: Diagnosis not present

## 2017-03-23 DIAGNOSIS — I519 Heart disease, unspecified: Secondary | ICD-10-CM | POA: Diagnosis not present

## 2017-03-23 DIAGNOSIS — K449 Diaphragmatic hernia without obstruction or gangrene: Secondary | ICD-10-CM | POA: Diagnosis present

## 2017-03-23 DIAGNOSIS — I5040 Unspecified combined systolic (congestive) and diastolic (congestive) heart failure: Secondary | ICD-10-CM | POA: Diagnosis not present

## 2017-03-23 DIAGNOSIS — L97523 Non-pressure chronic ulcer of other part of left foot with necrosis of muscle: Secondary | ICD-10-CM | POA: Diagnosis not present

## 2017-03-23 DIAGNOSIS — D631 Anemia in chronic kidney disease: Secondary | ICD-10-CM | POA: Diagnosis not present

## 2017-03-23 DIAGNOSIS — I10 Essential (primary) hypertension: Secondary | ICD-10-CM | POA: Diagnosis not present

## 2017-03-23 DIAGNOSIS — J441 Chronic obstructive pulmonary disease with (acute) exacerbation: Secondary | ICD-10-CM | POA: Diagnosis not present

## 2017-03-23 DIAGNOSIS — J189 Pneumonia, unspecified organism: Secondary | ICD-10-CM | POA: Diagnosis not present

## 2017-03-23 DIAGNOSIS — L03116 Cellulitis of left lower limb: Secondary | ICD-10-CM | POA: Diagnosis not present

## 2017-03-23 DIAGNOSIS — R2689 Other abnormalities of gait and mobility: Secondary | ICD-10-CM | POA: Diagnosis not present

## 2017-03-23 DIAGNOSIS — R69 Illness, unspecified: Secondary | ICD-10-CM | POA: Diagnosis not present

## 2017-03-23 DIAGNOSIS — D692 Other nonthrombocytopenic purpura: Secondary | ICD-10-CM | POA: Diagnosis not present

## 2017-03-23 DIAGNOSIS — K219 Gastro-esophageal reflux disease without esophagitis: Secondary | ICD-10-CM | POA: Diagnosis present

## 2017-03-23 DIAGNOSIS — E669 Obesity, unspecified: Secondary | ICD-10-CM | POA: Diagnosis present

## 2017-03-23 DIAGNOSIS — E1140 Type 2 diabetes mellitus with diabetic neuropathy, unspecified: Secondary | ICD-10-CM | POA: Diagnosis present

## 2017-03-23 DIAGNOSIS — E039 Hypothyroidism, unspecified: Secondary | ICD-10-CM

## 2017-03-23 DIAGNOSIS — I129 Hypertensive chronic kidney disease with stage 1 through stage 4 chronic kidney disease, or unspecified chronic kidney disease: Secondary | ICD-10-CM | POA: Diagnosis not present

## 2017-03-23 DIAGNOSIS — F418 Other specified anxiety disorders: Secondary | ICD-10-CM | POA: Diagnosis present

## 2017-03-23 DIAGNOSIS — E1122 Type 2 diabetes mellitus with diabetic chronic kidney disease: Secondary | ICD-10-CM | POA: Diagnosis present

## 2017-03-23 DIAGNOSIS — N186 End stage renal disease: Secondary | ICD-10-CM | POA: Diagnosis not present

## 2017-03-23 DIAGNOSIS — Z9981 Dependence on supplemental oxygen: Secondary | ICD-10-CM | POA: Diagnosis not present

## 2017-03-23 DIAGNOSIS — E1129 Type 2 diabetes mellitus with other diabetic kidney complication: Secondary | ICD-10-CM | POA: Diagnosis not present

## 2017-03-23 DIAGNOSIS — J449 Chronic obstructive pulmonary disease, unspecified: Secondary | ICD-10-CM | POA: Diagnosis not present

## 2017-03-23 DIAGNOSIS — E1142 Type 2 diabetes mellitus with diabetic polyneuropathy: Secondary | ICD-10-CM | POA: Diagnosis not present

## 2017-03-23 DIAGNOSIS — I2510 Atherosclerotic heart disease of native coronary artery without angina pectoris: Secondary | ICD-10-CM | POA: Diagnosis present

## 2017-03-23 DIAGNOSIS — E662 Morbid (severe) obesity with alveolar hypoventilation: Secondary | ICD-10-CM | POA: Diagnosis not present

## 2017-03-23 DIAGNOSIS — G2581 Restless legs syndrome: Secondary | ICD-10-CM | POA: Diagnosis present

## 2017-03-23 DIAGNOSIS — J440 Chronic obstructive pulmonary disease with acute lower respiratory infection: Secondary | ICD-10-CM | POA: Diagnosis present

## 2017-03-23 DIAGNOSIS — E785 Hyperlipidemia, unspecified: Secondary | ICD-10-CM | POA: Diagnosis present

## 2017-03-23 DIAGNOSIS — J3800 Paralysis of vocal cords and larynx, unspecified: Secondary | ICD-10-CM | POA: Diagnosis present

## 2017-03-23 DIAGNOSIS — H6533 Chronic mucoid otitis media, bilateral: Secondary | ICD-10-CM | POA: Diagnosis not present

## 2017-03-23 DIAGNOSIS — J9611 Chronic respiratory failure with hypoxia: Secondary | ICD-10-CM | POA: Diagnosis not present

## 2017-03-23 DIAGNOSIS — L98499 Non-pressure chronic ulcer of skin of other sites with unspecified severity: Secondary | ICD-10-CM | POA: Diagnosis not present

## 2017-03-23 DIAGNOSIS — I480 Paroxysmal atrial fibrillation: Secondary | ICD-10-CM | POA: Diagnosis not present

## 2017-03-23 DIAGNOSIS — I509 Heart failure, unspecified: Secondary | ICD-10-CM | POA: Diagnosis not present

## 2017-03-23 DIAGNOSIS — L89610 Pressure ulcer of right heel, unstageable: Secondary | ICD-10-CM | POA: Diagnosis not present

## 2017-03-23 DIAGNOSIS — T82868D Thrombosis of vascular prosthetic devices, implants and grafts, subsequent encounter: Secondary | ICD-10-CM | POA: Diagnosis not present

## 2017-03-23 DIAGNOSIS — K640 First degree hemorrhoids: Secondary | ICD-10-CM | POA: Diagnosis not present

## 2017-03-23 DIAGNOSIS — Z992 Dependence on renal dialysis: Secondary | ICD-10-CM

## 2017-03-23 DIAGNOSIS — E1151 Type 2 diabetes mellitus with diabetic peripheral angiopathy without gangrene: Secondary | ICD-10-CM | POA: Diagnosis present

## 2017-03-23 DIAGNOSIS — E6609 Other obesity due to excess calories: Secondary | ICD-10-CM | POA: Diagnosis not present

## 2017-03-23 DIAGNOSIS — Z89512 Acquired absence of left leg below knee: Secondary | ICD-10-CM | POA: Diagnosis not present

## 2017-03-23 DIAGNOSIS — A419 Sepsis, unspecified organism: Secondary | ICD-10-CM | POA: Diagnosis not present

## 2017-03-23 DIAGNOSIS — I120 Hypertensive chronic kidney disease with stage 5 chronic kidney disease or end stage renal disease: Secondary | ICD-10-CM | POA: Diagnosis present

## 2017-03-23 DIAGNOSIS — E113599 Type 2 diabetes mellitus with proliferative diabetic retinopathy without macular edema, unspecified eye: Secondary | ICD-10-CM | POA: Diagnosis present

## 2017-03-23 DIAGNOSIS — L97823 Non-pressure chronic ulcer of other part of left lower leg with necrosis of muscle: Secondary | ICD-10-CM | POA: Diagnosis not present

## 2017-03-23 DIAGNOSIS — I5032 Chronic diastolic (congestive) heart failure: Secondary | ICD-10-CM | POA: Diagnosis not present

## 2017-03-23 DIAGNOSIS — T8781 Dehiscence of amputation stump: Secondary | ICD-10-CM | POA: Diagnosis not present

## 2017-03-23 DIAGNOSIS — H6523 Chronic serous otitis media, bilateral: Secondary | ICD-10-CM | POA: Diagnosis present

## 2017-03-23 DIAGNOSIS — S91309A Unspecified open wound, unspecified foot, initial encounter: Secondary | ICD-10-CM | POA: Diagnosis not present

## 2017-03-23 DIAGNOSIS — Y835 Amputation of limb(s) as the cause of abnormal reaction of the patient, or of later complication, without mention of misadventure at the time of the procedure: Secondary | ICD-10-CM | POA: Diagnosis present

## 2017-03-23 DIAGNOSIS — Z01810 Encounter for preprocedural cardiovascular examination: Secondary | ICD-10-CM | POA: Diagnosis not present

## 2017-03-23 DIAGNOSIS — I252 Old myocardial infarction: Secondary | ICD-10-CM | POA: Diagnosis not present

## 2017-03-23 DIAGNOSIS — L03119 Cellulitis of unspecified part of limb: Secondary | ICD-10-CM | POA: Diagnosis not present

## 2017-03-23 DIAGNOSIS — T8131XA Disruption of external operation (surgical) wound, not elsewhere classified, initial encounter: Secondary | ICD-10-CM | POA: Diagnosis not present

## 2017-03-23 DIAGNOSIS — N2581 Secondary hyperparathyroidism of renal origin: Secondary | ICD-10-CM | POA: Diagnosis present

## 2017-03-23 DIAGNOSIS — F329 Major depressive disorder, single episode, unspecified: Secondary | ICD-10-CM | POA: Diagnosis present

## 2017-03-23 DIAGNOSIS — I110 Hypertensive heart disease with heart failure: Secondary | ICD-10-CM | POA: Diagnosis not present

## 2017-03-23 DIAGNOSIS — E119 Type 2 diabetes mellitus without complications: Secondary | ICD-10-CM | POA: Diagnosis not present

## 2017-03-23 DIAGNOSIS — Z22322 Carrier or suspected carrier of Methicillin resistant Staphylococcus aureus: Secondary | ICD-10-CM | POA: Diagnosis not present

## 2017-03-23 DIAGNOSIS — S41101A Unspecified open wound of right upper arm, initial encounter: Secondary | ICD-10-CM | POA: Diagnosis not present

## 2017-03-23 LAB — CULTURE, BLOOD (ROUTINE X 2)
CULTURE: NO GROWTH
Culture: NO GROWTH
Special Requests: ADEQUATE
Special Requests: ADEQUATE

## 2017-03-23 LAB — RENAL FUNCTION PANEL
Albumin: 2.9 g/dL — ABNORMAL LOW (ref 3.5–5.0)
Anion gap: 14 (ref 5–15)
BUN: 57 mg/dL — ABNORMAL HIGH (ref 6–20)
CO2: 22 mmol/L (ref 22–32)
Calcium: 9.3 mg/dL (ref 8.9–10.3)
Chloride: 94 mmol/L — ABNORMAL LOW (ref 101–111)
Creatinine, Ser: 4.77 mg/dL — ABNORMAL HIGH (ref 0.44–1.00)
GFR calc Af Amer: 10 mL/min — ABNORMAL LOW (ref >60)
GFR calc non Af Amer: 9 mL/min — ABNORMAL LOW (ref >60)
Glucose, Bld: 196 mg/dL — ABNORMAL HIGH (ref 65–99)
Phosphorus: 4.9 mg/dL — ABNORMAL HIGH (ref 2.5–4.6)
Potassium: 4.4 mmol/L (ref 3.5–5.1)
Sodium: 130 mmol/L — ABNORMAL LOW (ref 135–145)

## 2017-03-23 LAB — CBC
HCT: 33.8 % — ABNORMAL LOW (ref 36.0–46.0)
Hemoglobin: 10.2 g/dL — ABNORMAL LOW (ref 12.0–15.0)
MCH: 30.6 pg (ref 26.0–34.0)
MCHC: 30.2 g/dL (ref 30.0–36.0)
MCV: 101.5 fL — ABNORMAL HIGH (ref 78.0–100.0)
Platelets: 196 10*3/uL (ref 150–400)
RBC: 3.33 MIL/uL — ABNORMAL LOW (ref 3.87–5.11)
RDW: 17.6 % — ABNORMAL HIGH (ref 11.5–15.5)
WBC: 14.3 10*3/uL — ABNORMAL HIGH (ref 4.0–10.5)

## 2017-03-23 LAB — GLUCOSE, CAPILLARY: Glucose-Capillary: 96 mg/dL (ref 65–99)

## 2017-03-23 MED ORDER — DOXERCALCIFEROL 4 MCG/2ML IV SOLN
INTRAVENOUS | Status: AC
  Filled 2017-03-23: qty 2

## 2017-03-23 MED ORDER — MIDODRINE HCL 5 MG PO TABS
ORAL_TABLET | ORAL | Status: AC
  Filled 2017-03-23: qty 2

## 2017-03-23 MED ORDER — INSULIN GLARGINE 100 UNIT/ML ~~LOC~~ SOLN: 30.0000 [IU] | Freq: Two times a day (BID) | SUBCUTANEOUS | Status: AC

## 2017-03-23 MED ORDER — IPRATROPIUM-ALBUTEROL 0.5-2.5 (3) MG/3ML IN SOLN: 3.0000 mL | RESPIRATORY_TRACT | Status: AC | PRN

## 2017-03-23 MED ORDER — BUDESONIDE 0.25 MG/2ML IN SUSP: 0.2500 mg | Freq: Two times a day (BID) | RESPIRATORY_TRACT | Status: AC

## 2017-03-23 MED ORDER — LIDOCAINE-PRILOCAINE 2.5-2.5 % EX CREA: 1.0000 "application " | TOPICAL_CREAM | CUTANEOUS | Status: DC | PRN

## 2017-03-23 MED ORDER — TRAMADOL HCL 50 MG PO TABS: 50.0000 mg | ORAL_TABLET | Freq: Four times a day (QID) | ORAL | 0 refills | Status: AC | PRN

## 2017-03-23 MED ORDER — LIDOCAINE HCL (PF) 1 % IJ SOLN: 5.0000 mL | INTRAMUSCULAR | Status: DC | PRN

## 2017-03-23 MED ORDER — PANTOPRAZOLE SODIUM 40 MG PO TBEC: 40.0000 mg | DELAYED_RELEASE_TABLET | Freq: Two times a day (BID) | ORAL | Status: AC

## 2017-03-23 MED ORDER — GUAIFENESIN-DM 100-10 MG/5ML PO SYRP: 15.0000 mL | ORAL_SOLUTION | Freq: Four times a day (QID) | ORAL | Status: AC | PRN

## 2017-03-23 MED ORDER — PREDNISONE 10 MG PO TABS: ORAL_TABLET | ORAL | Status: DC

## 2017-03-23 MED ORDER — SODIUM CHLORIDE 0.9 % IV SOLN: 100.0000 mL | INTRAVENOUS | Status: DC | PRN

## 2017-03-23 MED ORDER — INSULIN ASPART 100 UNIT/ML ~~LOC~~ SOLN: 0.0000 [IU] | Freq: Three times a day (TID) | SUBCUTANEOUS | Status: AC

## 2017-03-23 MED ORDER — PENTAFLUOROPROP-TETRAFLUOROETH EX AERO: 1.0000 "application " | INHALATION_SPRAY | CUTANEOUS | Status: DC | PRN

## 2017-03-23 MED ORDER — ALTEPLASE 2 MG IJ SOLR: 2.0000 mg | Freq: Once | INTRAMUSCULAR | Status: DC | PRN

## 2017-03-23 MED ORDER — HEPARIN SODIUM (PORCINE) 1000 UNIT/ML DIALYSIS: 1000.0000 [IU] | INTRAMUSCULAR | Status: DC | PRN

## 2017-03-23 MED ORDER — HEPARIN SODIUM (PORCINE) 1000 UNIT/ML DIALYSIS
20.0000 [IU]/kg | INTRAMUSCULAR | Status: DC | PRN
  Administered 2017-03-23: 2100 [IU] via INTRAVENOUS_CENTRAL

## 2017-03-23 MED ORDER — ALBUTEROL SULFATE (2.5 MG/3ML) 0.083% IN NEBU
INHALATION_SOLUTION | RESPIRATORY_TRACT | Status: AC
  Administered 2017-03-23: 2.5 mg
  Filled 2017-03-23: qty 3

## 2017-03-23 NOTE — Progress Notes (Signed)
PT Cancellation Note  Patient Details Name: Galen ManilaSusan K Kohlmann MRN: [id number] DOB: [birth year]   Cancelled Treatment:    Reason Eval/Treat Not Completed: Patient at procedure or test/unavailable . Pt currently in HD. Will check back later as time allows.   Colin BroachSabra M. Chole Driver PT, DPT  [phone]  03/23/2017, 8:23 AM

## 2017-03-23 NOTE — Clinical Social Work Note (Signed)
Clinical Social Worker facilitated patient discharge including contacting patient family and facility to confirm patient discharge plans.  Clinical information faxed to facility and family agreeable with plan.  CSW arranged ambulance transport via [facility] to [place].  RN to call report prior to discharge.  Clinical Social Worker will sign off for now as social work intervention is no longer needed. Please consult us again if new need arises.  Jesse Aliscia Clayton, [name] [phone] 

## 2017-03-23 NOTE — Progress Notes (Signed)
[facility] Progress Note  Dialysis Orders:MWF [facility] 4.5 hours 2 K 2 Ca 400/800 EDW 104 right upper AVGG heparin 6000 load and 5000 mid tmt venofer 100 through 7/11 hectorol 1 mircera 150 - last 6/7  Assessment/Plan: 1. COPD exacerbation - cough / SOB, clear CXR x 2.  Per primary.  2. ESRD MWF next HD Friday first round 3. Clotted AVG - s/p declot revision with Dr. Darrick PennaFields 7/7. Appreciate VVS assistance. Watch prox incision site near axilla; seemed to have some prolong bleeding at distal needle site post HD Wed 4. BP/volume - chronic low BP - midodrine for BP support 5. Anemia - Hgb 10.5    - StartedAranesp 100 q Monday 7/9Holding Fe course with infection- can resume at d/c to make up previous doses  6. Metabolic bone disease - Continue hectorol/binders- Calcium borderline high -only on 1 hectorol  7. Nutrition - D1 diet with thin liquids- multivit; doesn't like oral supplements alb 3 - added prostat 8. Recent left BKA - Dr. Lajoyce Cornersuda - with wound healing issues - seen by Dr. Lajoyce Cornersuda recommending revision of BKA to AKA with progressive gangrenous changes; per primary, Dr. Lajoyce Cornersuda wishes to address as outpatient 9. Right heel ulcer - wound care RN following 10. DNR 11. MRSA contact precautions 12. Hx of PAF  13. 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 DM - BS high made worse by steroids 15. Disp - d/c soon back to SNF, ok for dc from renal standpoint  Kimberly Moselleob Kimberly Ayars MD [facility] pgr [phone]   03/23/2017, 10:31 AM        Subjective:  Periodic coughing. Eating well  Objective Vitals:   03/23/17 0830 03/23/17 0900 03/23/17 0927 03/23/17 0958  BP: (!) 93/46 (!) 86/37 (!) 89/54 94/62  Pulse: 81 84 84 80  Resp: 19 18 16 15   Temp:      TempSrc:      SpO2:      Weight:      Height:       Physical Exam General: NAD alert and engaged in conversation about her  care Heart: RRR Lungs: wheezes - esp exp, coarse BS  Abdomen:obese soft Extremities: left BKA wrapped right LE 1 + edema Dialysis Access:  Right upper AVGG - prox incision site, some serosanguinous drainage + bruit   Additional Objective Labs: Basic Metabolic Panel:  Recent Labs Lab 03/20/17 0459 03/21/17 0356 03/23/17 0726  NA 133* 132*  132* 130*  K 4.2 5.0  5.0 4.4  CL 97* 97*  98* 94*  CO2 25 19*  18* 22  GLUCOSE 373* 269*  260* 196*  BUN 28* 52*  52* 57*  CREATININE 3.68* 4.81*  4.75* 4.77*  CALCIUM 10.0 9.7  9.6 9.3  PHOS 4.5 4.7*  4.6 4.9*   Liver Function Tests:  Recent Labs Lab 03/19/17 0539 03/20/17 0459 03/21/17 0356 03/23/17 0726  AST 24 15 22   --   ALT 10* 11* 10*  --   ALKPHOS 136* 135* 124  --   BILITOT 0.9 0.6 1.0  --   PROT 8.1 8.2* 7.9  --   ALBUMIN 3.0* 3.0* 3.0*  2.9* 2.9*   CBC:  Recent Labs Lab 03/18/17 0319 03/19/17 0539 03/20/17 0459 03/21/17 0356 03/23/17 0726  WBC 8.6 10.3 12.3* 16.0* 14.3*  NEUTROABS 6.5 9.5* 11.3* 14.0*  --   HGB 8.7* 10.2* 10.0* 10.5* 10.2*  HCT 28.8*  33.0* 33.0* 35.8* 33.8*  MCV 102.9* 103.8* 104.4* 104.1* 101.5*  PLT 168 203 158 161 196   Blood Culture    Component Value Date/Time   SDES BLOOD LEFT HAND 03/18/2017 0326   SPECREQUEST IN PEDIATRIC BOTTLE Blood Culture adequate volume 03/18/2017 0326   CULT NO GROWTH 4 DAYS 03/18/2017 0326   REPTSTATUS PENDING 03/18/2017 0326   CBG:  Recent Labs Lab 03/21/17 2141 03/22/17 0809 03/22/17 1142 03/22/17 1629 03/22/17 2231  GLUCAP 388* 189* 269* 404* 393*   Iron Studies: No results for input(s): IRON, TIBC, TRANSFERRIN, FERRITIN in the last 72 hours. Lab Results  Component Value Date   INR 1.33 02/15/2017   INR 1.33 07/20/2015   INR 1.35 07/19/2015   Studies/Results: Dg Chest 2 View  Result Date: 03/21/2017 CLINICAL DATA:  Pneumonia. EXAM: CHEST  2 VIEW COMPARISON:  03/18/2017, 03/16/2017, 03/14/2017 and 02/18/2017 FINDINGS:  Interstitial infiltrates have cleared. The lungs are now clear. Heart size and pulmonary vascularity are normal. IMPRESSION: Resolution of pulmonary infiltrates.  No effusions. Electronically Signed   By: Francene Boyers M.D.   On: 03/21/2017 15:25   Medications: . sodium chloride Stopped (03/17/17 0945)  . sodium chloride    . sodium chloride    . sodium chloride     . aspirin EC  81 mg Oral Daily  . budesonide (PULMICORT) nebulizer solution  0.25 mg Nebulization BID  . darbepoetin (ARANESP) injection - DIALYSIS  60 mcg Intravenous Q Mon-HD  . doxercalciferol  1 mcg Intravenous Q M,W,F-HD  . feeding supplement (PRO-STAT SUGAR FREE 64)  30 mL Oral BID  . heparin  5,000 Units Subcutaneous Q8H  . insulin aspart  0-20 Units Subcutaneous TID WC  . insulin aspart  0-5 Units Subcutaneous QHS  . insulin aspart  6 Units Subcutaneous TID WC  . insulin glargine  32 Units Subcutaneous BID  . ipratropium-albuterol  3 mL Nebulization BID  . levothyroxine  88 mcg Oral QAC breakfast  . lidocaine  1 patch Transdermal Q24H  . mouth rinse  15 mL Mouth Rinse BID  . midodrine  10 mg Oral Daily  . multivitamin with minerals  1 tablet Oral Daily  . pantoprazole  40 mg Oral BID AC  . predniSONE  30 mg Oral Q breakfast  . sevelamer carbonate  2.4 g Oral TID WC  . silver sulfADIAZINE  1 application Topical Daily

## 2017-03-23 NOTE — Discharge Instructions (Signed)

## 2017-03-23 NOTE — Progress Notes (Signed)
Inpatient Diabetes Program Recommendations  AACE/ADA: New Consensus Statement on Inpatient Glycemic Control (2015)  Target Ranges:  Prepandial:   less than 140 mg/dL      Peak postprandial:   less than 180 mg/dL (1-2 hours)      Critically ill patients:  140 - 180 mg/dL   Results for [name], Denetra K (MRN [id number]) as of 03/23/2017 08:17  Ref. Range 03/22/2017 08:09 03/22/2017 11:42 03/22/2017 16:29 03/22/2017 22:31  Glucose-Capillary Latest Ref Range: 65 - 99 mg/dL 409189 (H) Novolog 10 units given 269 (H) Lantus 32 Novolog 11 given 404 (H) Novolog 26 units given 393 (H) Novolog 5 units given   Results for [name], Breyon K (MRN [id number]) as of 03/23/2017 08:17  Ref. Range 03/23/2017 07:26  Glucose Latest Ref Range: 65 - 99 mg/dL 956196 (H)   Review of Glycemic Control  Diabetes history: DM2 Outpatient Diabetes medications: Lantus 28 units BID, Novolog 0-9 units TID Current orders for Inpatient glycemic control: Lantus 32 units BID, Novolog 0-20 units TID with meals, Novolog 0-5 units QHS, Novolog 6 units meal coverage TID  Inpatient Diabetes Program Recommendations:  Patient is requiring at least 10 units at each meal and still has hyperglycemia into the 400's while on steroids. Consider increasing meal coverage to 10 units tid.  Thanks,  Christena DeemShannon Filbert Craze RN, MSN, [facility] Inpatient Diabetes Coordinator Team Pager [phone] (8a-5p)

## 2017-03-23 NOTE — Progress Notes (Addendum)
[facility]. Report called,and given to a Reita Clichehonda Thomas Walker LPN. [facility] is to transport patient to awaiting facility.

## 2017-03-23 NOTE — Discharge Summary (Addendum)
Physician Discharge Summary  Kimberly [name] ZOX:[id number] DOB: 1956-05-26  PCP: Merlene Laughter, MD  Admit date: 03/14/2017 Discharge date: 03/23/2017  Recommendations for Outpatient Follow-up:  1. M.D. at SNF in 2 days.  2. Recommend speech therapy and wound care consultation and follow-up at SNF. 3. Hemodialysis center: Keep scheduled hemodialysis appointments on Mondays, Wednesdays and Fridays. Periodic lab draws (CBC & renal panel) at dialysis. 4. Dr. Aldean Baker, Orthopedics in 2 weeks. SNF to coordinate follow-up. 5. Dr. Lemar Livings, Vascular surgery on 04/13/2017 at 10:15 AM. 6. Dr. Merlene Laughter, PCP upon discharge from SNF.  Home Health: None Equipment/Devices: None    Discharge Condition: Improved and stable  CODE STATUS: DO NOT RESUSCITATE  Diet recommendation: Heart healthy and diabetic diet.  Discharge Diagnoses:  Principal Problem:   HCAP (healthcare-associated pneumonia) Active Problems:   CAD (coronary artery disease), native coronary artery   COPD (chronic obstructive pulmonary disease) (HCC)   Hypothyroidism   Paroxysmal atrial fibrillation (HCC)   Diabetes mellitus with complication (HCC)   Pneumonia   Dehiscence of amputation stump (HCC)   Brief Summary: 61 year old female with PMH of ESRD on MWF HD, CAD, chronic diastolic CHF, COPD on home oxygen, anxiety & depression, GERD, HLD, HTN, hypothyroid, DM 2 with peripheral neuropathy, stroke, recent left BKA, presented with productive cough, wheezing, retrosternal nonradiating chest pain, postnasal drainage and admitted for possible HCAP and COPD exacerbation. Nephrology consulted for dialysis needs.   Assessment & Plan:   1. Suspected healthcare associated pneumonia: One of 2 blood cultures from 7/4 showed coagulase-negative staph, likely contaminant. The other one was negative, final report. Blood cultures 2 from 7/8: Negative, final report. Completed treatment with 8 days of IV antibiotics. Repeat chest  x-ray 7/11 shows resolution of pulmonary infiltrates. 2. COPD exacerbation: Precipitated by possible pneumonia. Briefly placed on IV steroids which had to be quickly transitioned to oral steroids due to hyperglycemia. Wonder if some of her wheezing is related to vocal cord dysfunction. Clinically improving. Taper oral prednisone, continue bronchodilators nebulizations and twice a day PPI added. May consider outpatient pulmonology consultation. 3. ESRD on MWF HD: Nephrology consulted for inpatient dialysis needs. Last dialysis was on morning of discharge 7/13. Clotted AVG: Status post declot revision by Dr. Darrick Penna 7/7. 4. Metabolic bone disease: Management per nephrology as outpatient. 5. Chronic hypotension: Continue midodrine. Stable. 6. Chronic anemia: Related to chronic kidney disease. Received Aranesp in the hospital.  7. Right heel ulcer:  continue recommendations made by wound care RN. 8. Recent left BKA: Seen by Dr. Lajoyce Corners, orthopedics on day of discharge and indicates that patient has shown interval improvement from prior exam. He recommends continuing with dressing changes as an outpatient and follow-up with him in the office in 2 weeks and plan for either revision transtibial amputation or above-knee amputation. 9. History of PAF: Patient has refused anticoagulation previously. 10. 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 Outpatient follow-up with ENT. 11. Uncontrolled DM 2 with renal complications:  hyperglycemia now precipitated by steroids but may be improving as steroids are tapering. Insulin suggested at discharge. Close monitoring at SNF and continue to adjust as needed. 12. Leukocytosis: Secondary to steroids. 13. Atypical chest pain: Minimally elevated troponin but flat trend. Likely due to ESRD. May have demand ischemia related to pneumonia. EKG showed LBBB. Chest pain resolved. 14. Hypothyroid: Continue  Synthroid. 15. Dysphagia: Continue modified diet and follow-up with speech therapy at SNF. 16. Adult failure to  thrive: Multifactorial due to multiple severe significant comorbidities. 17. Hyponatremia: Secondary to hyperglycemia and ESRD. Stable.   Consultants:  Nephrology Orthopedics  Vascular surgery  Procedures:  Hemodialysis   Discharge Instructions  Discharge Instructions    (HEART FAILURE PATIENTS) Call MD:  Anytime you have any of the following symptoms: 1) 3 pound weight gain in 24 hours or 5 pounds in 1 week 2) shortness of breath, with or without a dry hacking cough 3) swelling in the hands, feet or stomach 4) if you have to sleep on extra pillows at night in order to breathe.    Complete by:  As directed    Call MD for:  difficulty breathing, headache or visual disturbances    Complete by:  As directed    Call MD for:  extreme fatigue    Complete by:  As directed    Call MD for:  persistant dizziness or light-headedness    Complete by:  As directed    Call MD for:  redness, tenderness, or signs of infection (pain, swelling, redness, odor or green/yellow discharge around incision site)    Complete by:  As directed    Call MD for:  severe uncontrolled pain    Complete by:  As directed    Call MD for:  temperature >100.4    Complete by:  As directed    Change dressing    Complete by:  As directed    Dry dressing change daily to left below the knee amputation. Wash residual limb with soap and water daily.   Diet - low sodium heart healthy    Complete by:  As directed    Diet Carb Modified    Complete by:  As directed    Discharge instructions    Complete by:  As directed    DIET: Dysphagia 1 diet and thin liquids with diabetic and heart healthy modifications.   Increase activity slowly    Complete by:  As directed        Medication List    STOP taking these medications   amoxicillin-clavulanate 875-125 MG tablet Commonly known as:  AUGMENTIN     TAKE  these medications   acetaminophen 325 MG tablet Commonly known as:  TYLENOL Take 2 tablets (650 mg total) by mouth every 6 (six) hours as needed for mild pain (or Fever >/= 101).   albuterol 108 (90 Base) MCG/ACT inhaler Commonly known as:  PROVENTIL HFA;VENTOLIN HFA Inhale 2 puffs into the lungs every 6 (six) hours as needed for wheezing or shortness of breath.   aspirin EC 81 MG tablet Take 81 mg by mouth daily.   budesonide 0.25 MG/2ML nebulizer solution Commonly known as:  PULMICORT Take 2 mLs (0.25 mg total) by nebulization 2 (two) times daily.   docusate sodium 100 MG capsule Commonly known as:  COLACE Take 100 mg by mouth 2 (two) times daily.   feeding supplement Liqd Take 1 Container by mouth 2 (two) times daily. 30ml   fluticasone 50 MCG/ACT nasal spray Commonly known as:  FLONASE Place 2 sprays into both nostrils at bedtime.   guaiFENesin-dextromethorphan 100-10 MG/5ML syrup Commonly known as:  ROBITUSSIN DM Take 15 mLs by mouth every 6 (six) hours as needed for cough.   hydrocortisone 2.5 % rectal cream Commonly known as:  ANUSOL-HC Place 1 application rectally as needed for hemorrhoids or itching.   insulin aspart 100 UNIT/ML injection Commonly known as:  novoLOG Inject 0-15 Units into the skin 3 (three) times daily  with meals. CBG < 70: implement hypoglycemia protocol CBG 70 - 120: 0 units CBG 121 - 150: 2 units CBG 151 - 200: 3 units CBG 201 - 250: 5 units CBG 251 - 300: 8 units CBG 301 - 350: 11 units CBG 351 - 400: 15 units CBG > 400 call MD. What changed:  how much to take  additional instructions   insulin glargine 100 UNIT/ML injection Commonly known as:  LANTUS Inject 0.3 mLs (30 Units total) into the skin 2 (two) times daily. What changed:  how much to take   ipratropium-albuterol 0.5-2.5 (3) MG/3ML Soln Commonly known as:  DUONEB Take 3 mLs by nebulization every 4 (four) hours as needed. Wheezing or dyspnea.   levothyroxine 88 MCG  tablet Commonly known as:  SYNTHROID, LEVOTHROID Take 1 tablet (88 mcg total) by mouth daily before breakfast.   lidocaine-prilocaine cream Commonly known as:  EMLA Apply 1 application topically See admin instructions. Apply on Monday, Wednesday and Friday for pain prior to dialysis  The cream is 2.5 %   midodrine 10 MG tablet Commonly known as:  PROAMATINE Take 1 tablet (10 mg total) by mouth daily.   MULTIVITAMIN ADULT Chew Chew 1 tablet by mouth daily.   DECUBI-VITE Caps Take 1 capsule by mouth daily.   OXYGEN 2lpm 24/7- AHC   pantoprazole 40 MG tablet Commonly known as:  PROTONIX Take 1 tablet (40 mg total) by mouth 2 (two) times daily before a meal.   polyethylene glycol packet Commonly known as:  MIRALAX / GLYCOLAX Take 17 g by mouth daily as needed for mild constipation.   predniSONE 10 MG tablet Commonly known as:  DELTASONE Take 3 tabs daily 3 days, then 2 tabs daily 3 days, then 1 tab daily 3 days, then stop.   senna 8.6 MG tablet Commonly known as:  SENOKOT Take 2 tablets (17.2 mg total) by mouth at bedtime as needed for constipation.   sevelamer carbonate 2.4 g Pack Commonly known as:  RENVELA Take 2.4 g by mouth 3 (three) times daily with meals.   silver sulfADIAZINE 1 % cream Commonly known as:  SILVADENE Apply 1 application topically daily.   traMADol 50 MG tablet Commonly known as:  ULTRAM Take 1 tablet (50 mg total) by mouth every 6 (six) hours as needed for moderate pain or severe pain. What changed:  reasons to take this       Contact information for follow-up providers    Nadara Mustard, MD Follow up in 2 week(s).   Specialty:  Orthopedic Surgery Contact information: [address] [place] [address] [phone]        Merlene Laughter, MD. Schedule an appointment as soon as possible for a visit.   Specialty:  Internal Medicine Why:  Upon discharge from SNF. Contact information: [address] [place] [address] [phone]        M.D. at SNF. Schedule an appointment as soon as possible for a visit in 2 day(s).        Hemodialysis center Follow up.   Why:  Keep scheduled HD appointments on Mondays, Wednesdays and Fridays.           Contact information for after-discharge care    Destination    [facility] SN[facility] Follow up.   Specialty:  Skilled Nursing Chief of Staff information: [address] [place] [address] [phone]  Allergies  Allergen Reactions  . Ciprofloxacin Itching  . Epinephrine Palpitations      Procedures/Studies: Dg Chest 2 View  Result Date: 03/21/2017 CLINICAL DATA:  Pneumonia. EXAM: CHEST  2 VIEW COMPARISON:  03/18/2017, 03/16/2017, 03/14/2017 and 02/18/2017 FINDINGS: Interstitial infiltrates have cleared. The lungs are now clear. Heart size and pulmonary vascularity are normal. IMPRESSION: Resolution of pulmonary infiltrates.  No effusions. Electronically Signed   By: Francene BoyersJames  Maxwell M.D.   On: 03/21/2017 15:25   Dg Chest 2 View  Result Date: 03/19/2017 CLINICAL DATA:  Pneumonia. Persisting cough and shortness of breath. EXAM: CHEST  2 VIEW COMPARISON:  Radiographs 03/18/2017, additional priors. FINDINGS: Borderline mild cardiomegaly, unchanged mediastinal contours. Improving pulmonary edema/interstitial opacities. No consolidation to suggest pneumonia, particularly at site previously questioned in the left lung base. No pleural effusion. No pneumothorax. Stable osseous structures. IMPRESSION: Improving pulmonary edema/interstitial opacities. No confluent consolidation. Electronically Signed   By: Rubye OaksMelanie  Ehinger M.D.   On: 03/19/2017 16:30   Dg Chest 2 View  Result Date: 03/14/2017 CLINICAL DATA:  PER EMS: pt is from Dialysis with c/o chest congestion after receiving 4 hours of dialysis of her usual 4.5hrs dialysis. She states today they withdrew 10 lbs of fluids from  her which she states "usually they don't draw anywhere near that much fluid. Chest congestion. History of diabetes. EXAM: CHEST  2 VIEW COMPARISON:  02/18/2017 FINDINGS: Patient is slightly rotated towards the right. Heart size is accentuated by the AP position of the patient. Shallow lung inflation. Stable elevation of right hemidiaphragm. Density at the left lung base Raises the question of early left lower lobe infiltrate or could be related to artifact from positioning. IMPRESSION: 1.  Study quality is degraded by patient positioning. 2. Possible left lower lobe infiltrate. Electronically Signed   By: Norva PavlovElizabeth  Brown M.D.   On: 03/14/2017 18:46   Dg Chest Port 1 View  Result Date: 03/18/2017 CLINICAL DATA:  Acute onset of respiratory distress. Initial encounter. EXAM: PORTABLE CHEST 1 VIEW COMPARISON:  Chest radiograph performed 03/16/2017 FINDINGS: The lungs are hypoexpanded. Bilateral central airspace opacification, worse on the left, may reflect asymmetric pulmonary edema or possibly pneumonia. No definite pleural effusion or pneumothorax is seen. The cardiomediastinal silhouette is borderline enlarged. No acute osseous abnormalities are identified. IMPRESSION: Lungs hypoexpanded. Bilateral central airspace opacification, worse on the left, may reflect asymmetric pulmonary edema or possibly pneumonia. Borderline cardiomegaly. Electronically Signed   By: Roanna RaiderJeffery  Chang M.D.   On: 03/18/2017 06:09   Dg Chest Port 1 View  Result Date: 03/16/2017 CLINICAL DATA:  CHF. EXAM: PORTABLE CHEST 1 VIEW COMPARISON:  03/14/2017. FINDINGS: Cardiomegaly with very mild bilateral pulmonary interstitial prominence. Mild CHF cannot be excluded. Low lung volumes with improvement of left base atelectasis. No pneumothorax . IMPRESSION: 1. Cardiomegaly with very mild bilateral pulmonary interstitial prominence a mild CHF cannot be excluded. 2. Low lung volumes with improvement of left base atelectasis . Electronically Signed    By: Maisie Fushomas  Register   On: 03/16/2017 07:50   Dg Femur Min 2 Views Left  Result Date: 03/16/2017 CLINICAL DATA:  Left femur pain.  Post left BKA 02/16/2017 EXAM: LEFT FEMUR 2 VIEWS COMPARISON:  None. FINDINGS: Cortical margins of the left femur are intact. No fracture or focal lesion. Hip and knee alignment are maintained. Below-the-knee amputation with skin staples in place. Soft tissue defect is noted medially about the surgical wound that does not extend to bone. Resection margins are smooth. IMPRESSION: 1. No acute abnormality  or explanation for femur pain. 2. Below-the-knee amputation, resection margins are smooth. Skin staples in place. Skin defect in the surgical wound medially that does not extend to bone. Electronically Signed   By: Rubye Oaks M.D.   On: 03/16/2017 20:04      Subjective: Seen this morning at hemodialysis. Was somewhat upset because of discussion she had with nephrologist earlier regarding consideration for palliative care route of care. Comforted and reassured her. States that her dyspnea and wheezing have improved. No cough reported no pain reported.  Discharge Exam:  Vitals:   03/23/17 1059 03/23/17 1100 03/23/17 1108 03/23/17 1145  BP: (!) 114/46   (!) 125/48  Pulse: 80   81  Resp: 16   16  Temp:   98 F (36.7 C) 98.4 F (36.9 C)  TempSrc:   Oral Oral  SpO2:  100% 100% 100%  Weight:   104.1 kg (229 lb 8 oz)   Height:        General exam: Pleasant middle-aged female lying comfortably supine  in bed and undergoing hemodialysis.  Respiratory system: Improved breath sounds. Slightly harsh and occasional rhonchi in right upper lung fields. Rest of lung fields clear to auscultation without wheezing, crackles or rhonchi.  Cardiovascular system: S1 & S2 heard, RRR. No JVD, murmurs, rubs, gallops or clicks. No pedal edema.  Gastrointestinal system: Abdomen is nondistended, soft and nontender. No organomegaly or masses felt. Normal bowel sounds heard. Central  nervous system: Alert and oriented. No focal neurological deficits. Extremely hard of hearing in left ear. Able to hear when spoken close to right ear. Extremities: Symmetric 5 x 5 power. Skin: Left BKA dressing clean and dry >today's exam as per orthopedics . Right heel ulcer with dark eschar. Psychiatry: Judgement and insight appear normal. Mood & affect appropriate.     The results of significant diagnostics from this hospitalization (including imaging, microbiology, ancillary and laboratory) are listed below for reference.     Microbiology: Recent Results (from the past 240 hour(s))  Blood culture (routine x 2)     Status: None   Collection Time: 03/14/17  9:38 PM  Result Value Ref Range Status   Specimen Description BLOOD LEFT ANTECUBITAL  Final   Special Requests IN PEDIATRIC BOTTLE Blood Culture adequate volume  Final   Culture NO GROWTH 5 DAYS  Final   Report Status 03/19/2017 FINAL  Final  Blood culture (routine x 2)     Status: Abnormal   Collection Time: 03/14/17  9:49 PM  Result Value Ref Range Status   Specimen Description BLOOD BLOOD LEFT FOREARM  Final   Special Requests IN PEDIATRIC BOTTLE Blood Culture adequate volume  Final   Culture  Setup Time   Final    GRAM POSITIVE COCCI IN CLUSTERS IN PEDIATRIC BOTTLE CRITICAL RESULT CALLED TO, READ BACK BY AND VERIFIED WITH: J. LEDFORD, AT 1610 03/16/17 BY D. VANHOOK    Culture (A)  Final    STAPHYLOCOCCUS SPECIES (COAGULASE NEGATIVE) THE SIGNIFICANCE OF ISOLATING THIS ORGANISM FROM A SINGLE SET OF BLOOD CULTURES WHEN MULTIPLE SETS ARE DRAWN IS UNCERTAIN. PLEASE NOTIFY THE MICROBIOLOGY DEPARTMENT WITHIN ONE WEEK IF SPECIATION AND SENSITIVITIES ARE REQUIRED.    Report Status 03/17/2017 FINAL  Final  MRSA PCR Screening     Status: Abnormal   Collection Time: 03/15/17  9:41 AM  Result Value Ref Range Status   MRSA by PCR POSITIVE (A) NEGATIVE Final    Comment:        The GeneXpert  MRSA Assay (FDA approved for NASAL  specimens only), is one component of a comprehensive MRSA colonization surveillance program. It is not intended to diagnose MRSA infection nor to guide or monitor treatment for MRSA infections. CRITICAL RESULT CALLED TO, READ BACK BY AND VERIFIED WITH: C. YAP, RN AT 1210 ON 03/15/17 BY C. JESSUP, MLT.   Culture, blood (Routine X 2) w Reflex to ID Panel     Status: None   Collection Time: 03/18/17  3:19 AM  Result Value Ref Range Status   Specimen Description BLOOD LEFT HAND  Final   Special Requests IN PEDIATRIC BOTTLE Blood Culture adequate volume  Final   Culture NO GROWTH 5 DAYS  Final   Report Status 03/23/2017 FINAL  Final  Culture, blood (Routine X 2) w Reflex to ID Panel     Status: None   Collection Time: 03/18/17  3:26 AM  Result Value Ref Range Status   Specimen Description BLOOD LEFT HAND  Final   Special Requests IN PEDIATRIC BOTTLE Blood Culture adequate volume  Final   Culture NO GROWTH 5 DAYS  Final   Report Status 03/23/2017 FINAL  Final     Labs: CBC:  Recent Labs Lab 03/17/17 0558  03/18/17 0319 03/19/17 0539 03/20/17 0459 03/21/17 0356 03/23/17 0726  WBC 10.0  < > 8.6 10.3 12.3* 16.0* 14.3*  NEUTROABS 6.8  --  6.5 9.5* 11.3* 14.0*  --   HGB 10.2*  < > 8.7* 10.2* 10.0* 10.5* 10.2*  HCT 33.7*  < > 28.8* 33.0* 33.0* 35.8* 33.8*  MCV 101.8*  < > 102.9* 103.8* 104.4* 104.1* 101.5*  PLT 204  < > 168 203 158 161 196  < > = values in this interval not displayed. Basic Metabolic Panel:  Recent Labs Lab 03/17/17 0558  03/18/17 0319 03/19/17 0539 03/19/17 0815 03/20/17 0459 03/21/17 0356 03/23/17 0726  NA 132*  < > 132* 130*  --  133* 132*  132* 130*  K 4.9  < > 3.7 5.2*  --  4.2 5.0  5.0 4.4  CL 96*  < > 97* 95*  --  97* 97*  98* 94*  CO2 22  < > 20* 18*  --  25 19*  18* 22  GLUCOSE 113*  < > 81 404* 372* 373* 269*  260* 196*  BUN 37*  < > 15 34*  --  28* 52*  52* 57*  CREATININE 6.99*  < > 3.56* 5.26*  --  3.68* 4.81*  4.75* 4.77*  CALCIUM  9.3  < > 8.5* 9.8  --  10.0 9.7  9.6 9.3  MG 2.3  --  2.0 2.4  --  2.3 2.3  --   PHOS 5.4*  < > 2.8 5.1*  --  4.5 4.7*  4.6 4.9*  < > = values in this interval not displayed. Liver Function Tests:  Recent Labs Lab 03/17/17 0558  03/18/17 0319 03/19/17 0539 03/20/17 0459 03/21/17 0356 03/23/17 0726  AST 19  --  22 24 15 22   --   ALT 8*  --  7* 10* 11* 10*  --   ALKPHOS 144*  --  124 136* 135* 124  --   BILITOT 0.7  --  0.9 0.9 0.6 1.0  --   PROT 8.1  --  7.3 8.1 8.2* 7.9  --   ALBUMIN 2.4*  < > 2.9* 3.0* 3.0* 3.0*  2.9* 2.9*  < > = values in this interval not displayed. CBG:  Recent Labs Lab 03/22/17 0809 03/22/17 1142 03/22/17 1629 03/22/17 2231 03/23/17 1137  GLUCAP 189* 269* 404* 393* 96       Time coordinating discharge: Over 30 minutes  SIGNED:  Marcellus Scott, MD, FACP, FHM. [facility] Pager [phone]  If 7PM-7AM, please contact night-coverage [email or web address] Password [email or web address] 03/23/2017, 1:51 PM

## 2017-03-23 NOTE — Progress Notes (Signed)
PT Cancellation Note  Patient Details Name: Kimberly [name] MRN: [id number] DOB: [birth year]   Cancelled Treatment:    Reason Eval/Treat Not Completed: Other (comment) Pt is getting ready to discharge to SNF shortly. Will check back tomorrow if pt is still admitted to hospital.    Colin BroachSabra M. Georgeann Brinkman PT, DPT  [phone]  03/23/2017, 3:24 PM

## 2017-03-26 DIAGNOSIS — S41101A Unspecified open wound of right upper arm, initial encounter: Secondary | ICD-10-CM | POA: Diagnosis not present

## 2017-03-26 DIAGNOSIS — I110 Hypertensive heart disease with heart failure: Secondary | ICD-10-CM | POA: Diagnosis not present

## 2017-03-26 DIAGNOSIS — D631 Anemia in chronic kidney disease: Secondary | ICD-10-CM | POA: Diagnosis not present

## 2017-03-26 DIAGNOSIS — N186 End stage renal disease: Secondary | ICD-10-CM | POA: Diagnosis not present

## 2017-03-26 DIAGNOSIS — J449 Chronic obstructive pulmonary disease, unspecified: Secondary | ICD-10-CM | POA: Diagnosis not present

## 2017-03-26 DIAGNOSIS — S91309A Unspecified open wound, unspecified foot, initial encounter: Secondary | ICD-10-CM | POA: Diagnosis not present

## 2017-03-26 DIAGNOSIS — N2581 Secondary hyperparathyroidism of renal origin: Secondary | ICD-10-CM | POA: Diagnosis not present

## 2017-03-27 DIAGNOSIS — L89610 Pressure ulcer of right heel, unstageable: Secondary | ICD-10-CM | POA: Diagnosis not present

## 2017-03-27 DIAGNOSIS — N186 End stage renal disease: Secondary | ICD-10-CM | POA: Diagnosis not present

## 2017-03-27 DIAGNOSIS — I110 Hypertensive heart disease with heart failure: Secondary | ICD-10-CM | POA: Diagnosis not present

## 2017-03-27 DIAGNOSIS — L98499 Non-pressure chronic ulcer of skin of other sites with unspecified severity: Secondary | ICD-10-CM | POA: Diagnosis not present

## 2017-03-27 DIAGNOSIS — L97823 Non-pressure chronic ulcer of other part of left lower leg with necrosis of muscle: Secondary | ICD-10-CM | POA: Diagnosis not present

## 2017-03-27 DIAGNOSIS — E1129 Type 2 diabetes mellitus with other diabetic kidney complication: Secondary | ICD-10-CM | POA: Diagnosis not present

## 2017-03-28 DIAGNOSIS — D631 Anemia in chronic kidney disease: Secondary | ICD-10-CM | POA: Diagnosis not present

## 2017-03-28 DIAGNOSIS — N186 End stage renal disease: Secondary | ICD-10-CM | POA: Diagnosis not present

## 2017-03-28 DIAGNOSIS — S91309A Unspecified open wound, unspecified foot, initial encounter: Secondary | ICD-10-CM | POA: Diagnosis not present

## 2017-03-28 DIAGNOSIS — S41101A Unspecified open wound of right upper arm, initial encounter: Secondary | ICD-10-CM | POA: Diagnosis not present

## 2017-03-28 DIAGNOSIS — N2581 Secondary hyperparathyroidism of renal origin: Secondary | ICD-10-CM | POA: Diagnosis not present

## 2017-03-29 ENCOUNTER — Telehealth (INDEPENDENT_AMBULATORY_CARE_PROVIDER_SITE_OTHER): Payer: Self-pay | Admitting: Orthopedic Surgery

## 2017-03-29 DIAGNOSIS — R2689 Other abnormalities of gait and mobility: Secondary | ICD-10-CM | POA: Diagnosis not present

## 2017-03-29 NOTE — Telephone Encounter (Signed)
Returned call to [facility]Patricia [name]  Left message to return call to schedule 2 week post op with Dr. Lajoyce Cornersuda [phone]

## 2017-03-30 DIAGNOSIS — N186 End stage renal disease: Secondary | ICD-10-CM | POA: Diagnosis not present

## 2017-03-30 DIAGNOSIS — N2581 Secondary hyperparathyroidism of renal origin: Secondary | ICD-10-CM | POA: Diagnosis not present

## 2017-03-30 DIAGNOSIS — D631 Anemia in chronic kidney disease: Secondary | ICD-10-CM | POA: Diagnosis not present

## 2017-03-30 DIAGNOSIS — S41101A Unspecified open wound of right upper arm, initial encounter: Secondary | ICD-10-CM | POA: Diagnosis not present

## 2017-03-30 DIAGNOSIS — S91309A Unspecified open wound, unspecified foot, initial encounter: Secondary | ICD-10-CM | POA: Diagnosis not present

## 2017-04-02 ENCOUNTER — Encounter: Payer: Self-pay | Admitting: Vascular Surgery

## 2017-04-02 DIAGNOSIS — N186 End stage renal disease: Secondary | ICD-10-CM | POA: Diagnosis not present

## 2017-04-02 DIAGNOSIS — S41101A Unspecified open wound of right upper arm, initial encounter: Secondary | ICD-10-CM | POA: Diagnosis not present

## 2017-04-02 DIAGNOSIS — S91309A Unspecified open wound, unspecified foot, initial encounter: Secondary | ICD-10-CM | POA: Diagnosis not present

## 2017-04-02 DIAGNOSIS — D631 Anemia in chronic kidney disease: Secondary | ICD-10-CM | POA: Diagnosis not present

## 2017-04-02 DIAGNOSIS — N2581 Secondary hyperparathyroidism of renal origin: Secondary | ICD-10-CM | POA: Diagnosis not present

## 2017-04-03 DIAGNOSIS — L97823 Non-pressure chronic ulcer of other part of left lower leg with necrosis of muscle: Secondary | ICD-10-CM | POA: Diagnosis not present

## 2017-04-03 DIAGNOSIS — L89610 Pressure ulcer of right heel, unstageable: Secondary | ICD-10-CM | POA: Diagnosis not present

## 2017-04-03 DIAGNOSIS — L98499 Non-pressure chronic ulcer of skin of other sites with unspecified severity: Secondary | ICD-10-CM | POA: Diagnosis not present

## 2017-04-04 DIAGNOSIS — S91309A Unspecified open wound, unspecified foot, initial encounter: Secondary | ICD-10-CM | POA: Diagnosis not present

## 2017-04-04 DIAGNOSIS — S41101A Unspecified open wound of right upper arm, initial encounter: Secondary | ICD-10-CM | POA: Diagnosis not present

## 2017-04-04 DIAGNOSIS — D631 Anemia in chronic kidney disease: Secondary | ICD-10-CM | POA: Diagnosis not present

## 2017-04-04 DIAGNOSIS — N2581 Secondary hyperparathyroidism of renal origin: Secondary | ICD-10-CM | POA: Diagnosis not present

## 2017-04-04 DIAGNOSIS — N186 End stage renal disease: Secondary | ICD-10-CM | POA: Diagnosis not present

## 2017-04-05 DIAGNOSIS — F419 Anxiety disorder, unspecified: Secondary | ICD-10-CM | POA: Diagnosis not present

## 2017-04-05 DIAGNOSIS — I2729 Other secondary pulmonary hypertension: Secondary | ICD-10-CM | POA: Diagnosis not present

## 2017-04-05 DIAGNOSIS — E1122 Type 2 diabetes mellitus with diabetic chronic kidney disease: Secondary | ICD-10-CM | POA: Diagnosis not present

## 2017-04-05 DIAGNOSIS — E1151 Type 2 diabetes mellitus with diabetic peripheral angiopathy without gangrene: Secondary | ICD-10-CM | POA: Diagnosis not present

## 2017-04-05 DIAGNOSIS — I509 Heart failure, unspecified: Secondary | ICD-10-CM | POA: Diagnosis not present

## 2017-04-05 DIAGNOSIS — Z89512 Acquired absence of left leg below knee: Secondary | ICD-10-CM | POA: Diagnosis not present

## 2017-04-05 DIAGNOSIS — N186 End stage renal disease: Secondary | ICD-10-CM | POA: Diagnosis not present

## 2017-04-05 DIAGNOSIS — E113599 Type 2 diabetes mellitus with proliferative diabetic retinopathy without macular edema, unspecified eye: Secondary | ICD-10-CM | POA: Diagnosis not present

## 2017-04-05 DIAGNOSIS — D692 Other nonthrombocytopenic purpura: Secondary | ICD-10-CM | POA: Diagnosis not present

## 2017-04-05 DIAGNOSIS — E1129 Type 2 diabetes mellitus with other diabetic kidney complication: Secondary | ICD-10-CM | POA: Diagnosis not present

## 2017-04-05 DIAGNOSIS — J449 Chronic obstructive pulmonary disease, unspecified: Secondary | ICD-10-CM | POA: Diagnosis not present

## 2017-04-05 DIAGNOSIS — E1142 Type 2 diabetes mellitus with diabetic polyneuropathy: Secondary | ICD-10-CM | POA: Diagnosis not present

## 2017-04-06 DIAGNOSIS — S91309A Unspecified open wound, unspecified foot, initial encounter: Secondary | ICD-10-CM | POA: Diagnosis not present

## 2017-04-06 DIAGNOSIS — S41101A Unspecified open wound of right upper arm, initial encounter: Secondary | ICD-10-CM | POA: Diagnosis not present

## 2017-04-06 DIAGNOSIS — N2581 Secondary hyperparathyroidism of renal origin: Secondary | ICD-10-CM | POA: Diagnosis not present

## 2017-04-06 DIAGNOSIS — N186 End stage renal disease: Secondary | ICD-10-CM | POA: Diagnosis not present

## 2017-04-06 DIAGNOSIS — D631 Anemia in chronic kidney disease: Secondary | ICD-10-CM | POA: Diagnosis not present

## 2017-04-06 DIAGNOSIS — R2689 Other abnormalities of gait and mobility: Secondary | ICD-10-CM | POA: Diagnosis not present

## 2017-04-09 ENCOUNTER — Telehealth (INDEPENDENT_AMBULATORY_CARE_PROVIDER_SITE_OTHER): Payer: Self-pay | Admitting: Radiology

## 2017-04-09 DIAGNOSIS — N2581 Secondary hyperparathyroidism of renal origin: Secondary | ICD-10-CM | POA: Diagnosis not present

## 2017-04-09 DIAGNOSIS — D631 Anemia in chronic kidney disease: Secondary | ICD-10-CM | POA: Diagnosis not present

## 2017-04-09 DIAGNOSIS — S91309A Unspecified open wound, unspecified foot, initial encounter: Secondary | ICD-10-CM | POA: Diagnosis not present

## 2017-04-09 DIAGNOSIS — S41101A Unspecified open wound of right upper arm, initial encounter: Secondary | ICD-10-CM | POA: Diagnosis not present

## 2017-04-09 DIAGNOSIS — N186 End stage renal disease: Secondary | ICD-10-CM | POA: Diagnosis not present

## 2017-04-09 NOTE — Telephone Encounter (Signed)
Kimberly [name] [facility] to give FYI that there is dehiscences of below knee amputation. She states it has been hit multiple times when she transfers that staples fall out when a dressing is removed. There is narcotic tissue. Was advised by MD previously that revision surgery maybe necessary and this was advised by their wound care doctor at the facility as well. She is going to fax over notes and wound measurements she has been recording. Patient has appointment tomorrow afternoon with Dr. Lajoyce Cornersuda.

## 2017-04-10 ENCOUNTER — Ambulatory Visit (INDEPENDENT_AMBULATORY_CARE_PROVIDER_SITE_OTHER): Payer: Medicare Other | Admitting: Orthopedic Surgery

## 2017-04-10 ENCOUNTER — Encounter (INDEPENDENT_AMBULATORY_CARE_PROVIDER_SITE_OTHER): Payer: Self-pay | Admitting: Orthopedic Surgery

## 2017-04-10 VITALS — Ht 64.0 in | Wt 229.0 lb

## 2017-04-10 DIAGNOSIS — Z992 Dependence on renal dialysis: Secondary | ICD-10-CM | POA: Diagnosis not present

## 2017-04-10 DIAGNOSIS — H6523 Chronic serous otitis media, bilateral: Secondary | ICD-10-CM | POA: Diagnosis not present

## 2017-04-10 DIAGNOSIS — T8781 Dehiscence of amputation stump: Secondary | ICD-10-CM

## 2017-04-10 DIAGNOSIS — N186 End stage renal disease: Secondary | ICD-10-CM | POA: Diagnosis not present

## 2017-04-10 DIAGNOSIS — I129 Hypertensive chronic kidney disease with stage 1 through stage 4 chronic kidney disease, or unspecified chronic kidney disease: Secondary | ICD-10-CM | POA: Diagnosis not present

## 2017-04-10 NOTE — Progress Notes (Signed)
Office Visit Note   Patient: Kimberly [name]           Date of Birth: 09/25/1955           MRN: [id number] Visit Date: 04/10/2017              Requested by: [name], [name], MD [address] [place], [place] [address] PCP: [name], [name], MD  Chief Complaint  Patient presents with  . Left Leg - Routine Post Op    02/16/17 left below the knee amputation with prevena vac      HPI: Patient presents 2 months status post left transtibial amputation. By report patient has had multiple traumatic episodes to her left transtibial amputation and she has had progressive dehiscence medially and laterally with ischemic changes along the midline. Patient is currently at skilled nursing.  Patient has dialysis Monday Wednesday Friday which she states takes all day.  Assessment & Plan: Visit Diagnoses:  1. Dehiscence of amputation stump (HCC)     Plan: We will plan for revision of the transtibial amputation to an above-the-knee amputation. Patient does not have sufficient residual limb for revision of the transtibial amputation. We will plan for surgery on Tuesday with coordination with Dr. Ezzard StandingNewman to place tubes in her tympanic membrane. Patient states that she is also going to undergo vascular evaluation for the right lower extremity she states she has multiple blockages in the right lower extremity.  Follow-Up Instructions: Return in about 3 weeks (around 05/01/2017).   Ortho Exam  Patient is alert, oriented, no adenopathy, well-dressed, normal affect, normal respiratory effort. On examination patient has progressive dehiscence of the medial and lateral incisions. There is a large wound laterally and medially there is healthy granulation tissue at the base but patient has insufficient soft tissue for any potential healing. There is mild ischemic changes along the incision midline. There is no cellulitis no abscess no odor no drainage no signs of any deep infection. The medial and lateral  wounds are approximately 5 cm in diameter and approximately 3 cm deep.  Imaging: No results found.  Labs: Lab Results  Component Value Date   HGBA1C 6.6 10/06/2016   HGBA1C 9.9 (H) 08/23/2016   HGBA1C 8.7 (H) 04/15/2015   ESRSEDRATE 57 (H) 07/12/2015   ESRSEDRATE 110 (H) 04/15/2015   CRP 6.0 (H) 07/12/2015   CRP 3.8 (H) 04/15/2015   REPTSTATUS 03/23/2017 FINAL 03/18/2017   GRAMSTAIN  06/22/2014    MODERATE WBC PRESENT,BOTH PMN AND MONONUCLEAR RARE SQUAMOUS EPITHELIAL CELLS PRESENT NO ORGANISMS SEEN Performed at [facility]   CULT NO GROWTH 5 DAYS 03/18/2017   LABORGA CITROBACTER KOSERI 02/14/2017    Orders:  No orders of the defined types were placed in this encounter.  No orders of the defined types were placed in this encounter.    Procedures: No procedures performed  Clinical Data: No additional findings.  ROS:  All other systems negative, except as noted in the HPI. Review of Systems  Objective: Vital Signs: Ht 5\' 4"  (1.626 m)   Wt 229 lb (103.9 kg)   BMI 39.31 kg/m   Specialty Comments:  No specialty comments available.  PMFS History: Patient Active Problem List   Diagnosis Date Noted  . Dehiscence of amputation stump (HCC)   . Pneumonia 03/14/2017  . Hx of BKA, left (HCC) 02/27/2017  . Gangrene of left foot (HCC)   . Congestive heart failure (CHF) (HCC)   . Diabetes mellitus with complication (HCC)   .  Sepsis (HCC) 02/14/2017  . Diabetes mellitus, type II, insulin dependent (HCC)   . HCAP (healthcare-associated pneumonia) 09/11/2016  . Pressure injury of skin 08/29/2016  . Chest pain 08/28/2016  . Elevated troponin 08/28/2016  . 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 Hyperkalemia 08/23/2016  . Shortness of breath 08/23/2016  . Hyponatremia 08/23/2016  . Hypotension arterial 09/30/2015  . LBBB (left bundle branch block) 09/30/2015  . Complex endometrial hyperplasia with atypia 09/02/2015  . Severe obesity (HCC) 07/29/2015  . Hypotension  07/20/2015  . Uncontrolled type 2 diabetes mellitus (HCC) 07/20/2015  . Vaginal bleeding   . Altered mental status   . Pulmonary arterial hypertension (HCC)   . Paroxysmal atrial fibrillation (HCC) 07/11/2015  . ESRD (end stage renal disease) (HCC) 07/08/2015  . Pressure ulcer 04/07/2015  . Obesity hypoventilation syndrome (HCC) 08/05/2014  . Morbid obesity (HCC) 06/28/2014  . Physical deconditioning 06/28/2014  . Hypothyroidism 06/10/2014  . Carotid stenosis 12/02/2013  . Varicose veins of lower extremities with other complications 12/02/2013  . Chronic respiratory failure with hypoxia (HCC) 10/13/2013  . On home oxygen therapy 10/13/2013  . Chronic diastolic CHF (congestive heart failure) (HCC) 09/12/2013  . CAD (coronary artery disease), native coronary artery   . DM (diabetes mellitus), type 2 with renal complications (HCC) 05/31/2010  . Hyperlipidemia associated with type 2 diabetes mellitus (HCC) 05/31/2010  . COPD (chronic obstructive pulmonary disease) (HCC) 05/31/2010  . Hypertensive heart disease    Past Medical History:  Diagnosis Date  . Anemia   . Anxiety   . Arthritis    "knees, feet, hands" (08/23/2016)  . Asthma   . CAD, NATIVE VESSEL    May 10, 2010 cath showed a hyperdynamic LV function, she had dominant circumflex anatomy with a 70-80% small OM1. She had diffuse diabetic plaque particularly in the distal LAD. She nondominant RCA.  Nondominant  . Cellulitis 10/15/2013  . CHF (congestive heart failure) (HCC)    Preserved EF  . Chronic bronchitis (HCC)    "probably once/ yr" (08/23/2016)  . Complication of anesthesia    "I've had difficulty waking up" (08/23/2016)  . COPD   . Depression   . Diabetic neuropathy (HCC)   . Dyspnea   . Endometrial hyperplasia   . ESRD (end stage renal disease) on dialysis (HCC)    "[facility]; [facility]WF[facility]" (08/23/2016)  . Family history of adverse reaction to anesthesia    mother had hard time waking up  . GERD  (gastroesophageal reflux disease)   . History of hiatal hernia   . HYPERLIPIDEMIA   . HYPERTENSION   . Hypothyroidism   . Myocardial infarct, old   . On home oxygen therapy    "2L; 24/7" (08/23/2016)  . Paralyzed vocal cords   . Peripheral neuropathy    hx/notes 01/27/2010  . Pneumonia "several times"  . Proliferative retinopathy    hx/notes 01/27/2010  . PVD    CEA  . Restless leg syndrome    "mostly on the right" (08/23/2016)  . 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 Type II diabetes mellitus (HCC)     Family History  Problem Relation Age of Onset  . Cancer Mother        Breast, NHL  . Stroke Mother   . Peripheral vascular disease Father   . CAD Father 47  . Heart attack Father   .  Hypertension Father   . Asthma Father   . Heart disease Father        before age 62    Past Surgical History:  Procedure Laterality Date  . AMPUTATION Left 02/16/2017   Procedure: Left Below Knee Amputation;  Surgeon: Nadara Mustarduda, Whitleigh Garramone V, MD;  [facility];  Service: Orthopedics;  Laterality: Left;  . AV FISTULA PLACEMENT Right 07/08/2015   Procedure: EXPLORATION RIGHT AXILLARY ARTERY AND RIGHT BRACHIAL VEIN;  Surgeon: Chuck Hinthristopher S Dickson, MD;  [facility];  Service: Vascular;  Laterality: Right;  . AV FISTULA PLACEMENT Right 01/02/2017   Procedure: INSERTION OF ARTERIOVENOUS GORE-TEX  GRAFT  RIGHT ARM;  Surgeon: Chuck Hinthristopher S Dickson, MD;  [facility];  Service: Vascular;  Laterality: Right;  . CAROTID ENDARTERECTOMY Left X 2  . CATARACT EXTRACTION W/ INTRAOCULAR LENS  IMPLANT, BILATERAL Bilateral   . CERVICAL BIOPSY  ~ 2015   "precancerous cells"  . COLONOSCOPY    . EYE SURGERY Bilateral    numerous surgeries  . INSERTION OF DIALYSIS CATHETER N/A 06/24/2015   Procedure: ULTRASOUND BILATERAL INTERNAL JUGULAR VEIN INSERTION OF DIALYSIS CATHETER LEFT INTERNAL JUGULAR  VEIN ;  Surgeon: Pryor OchoaJames D Lawson, MD;  [facility];  Service: Vascular;  Laterality: N/A;  . INTRAUTERINE DEVICE INSERTION  ~ 2015  . IR REMOVAL TUN CV CATH W/O FL  02/15/2017  . THROMBECTOMY AND REVISION OF ARTERIOVENTOUS (AV) GORETEX  GRAFT Right 03/17/2017   Procedure: THROMBECTOMY AND REVISION OF ARM  ARTERIOVENTOUS GORETEX  GRAFT USING A 7MM BY 10CM GORTEX GRAFT ;  Surgeon: Sherren KernsFields, Charles E, MD;  [facility];  Service: Vascular;  Laterality: Right;  . VITRECTOMY Bilateral    Social History   Occupational History  . disabled    Social History Main Topics  . Smoking status: Former Smoker    Packs/day: 1.00    Years: 20.00    Types: Cigarettes    Quit date: 09/11/2005  . Smokeless tobacco: Never Used  . Alcohol use No  . Drug use: No  . Sexual activity: No

## 2017-04-11 DIAGNOSIS — I129 Hypertensive chronic kidney disease with stage 1 through stage 4 chronic kidney disease, or unspecified chronic kidney disease: Secondary | ICD-10-CM | POA: Diagnosis not present

## 2017-04-11 DIAGNOSIS — S91309A Unspecified open wound, unspecified foot, initial encounter: Secondary | ICD-10-CM | POA: Diagnosis not present

## 2017-04-11 DIAGNOSIS — N186 End stage renal disease: Secondary | ICD-10-CM | POA: Diagnosis not present

## 2017-04-11 DIAGNOSIS — N2581 Secondary hyperparathyroidism of renal origin: Secondary | ICD-10-CM | POA: Diagnosis not present

## 2017-04-11 DIAGNOSIS — Z992 Dependence on renal dialysis: Secondary | ICD-10-CM | POA: Diagnosis not present

## 2017-04-13 ENCOUNTER — Encounter: Payer: Medicare Other | Admitting: Vascular Surgery

## 2017-04-13 ENCOUNTER — Other Ambulatory Visit (INDEPENDENT_AMBULATORY_CARE_PROVIDER_SITE_OTHER): Payer: Self-pay | Admitting: Family

## 2017-04-13 ENCOUNTER — Encounter (HOSPITAL_COMMUNITY): Payer: Self-pay | Admitting: *Deleted

## 2017-04-13 DIAGNOSIS — N2581 Secondary hyperparathyroidism of renal origin: Secondary | ICD-10-CM | POA: Diagnosis not present

## 2017-04-13 DIAGNOSIS — N186 End stage renal disease: Secondary | ICD-10-CM | POA: Diagnosis not present

## 2017-04-13 DIAGNOSIS — S91309A Unspecified open wound, unspecified foot, initial encounter: Secondary | ICD-10-CM | POA: Diagnosis not present

## 2017-04-16 ENCOUNTER — Ambulatory Visit: Payer: Self-pay | Admitting: Otolaryngology

## 2017-04-16 ENCOUNTER — Encounter (HOSPITAL_COMMUNITY): Payer: Self-pay | Admitting: *Deleted

## 2017-04-16 DIAGNOSIS — N186 End stage renal disease: Secondary | ICD-10-CM | POA: Diagnosis not present

## 2017-04-16 DIAGNOSIS — N2581 Secondary hyperparathyroidism of renal origin: Secondary | ICD-10-CM | POA: Diagnosis not present

## 2017-04-16 DIAGNOSIS — S91309A Unspecified open wound, unspecified foot, initial encounter: Secondary | ICD-10-CM | POA: Diagnosis not present

## 2017-04-16 NOTE — Progress Notes (Signed)
Spoke with [place], LPN at [facility], where pt is a resident. She verified medical history, states pt is alert and oriented and can speak for herself. Pt is diabetic. They do not have a recent A1C on pt. [place]Brittany states pt's fasting blood sugar today was 208 but states that is unusual for her because they rarely have to use her sliding scale insulin in the AM. Faxed pre-op instructions to [place]Brittany at [phone].

## 2017-04-16 NOTE — Pre-Procedure Instructions (Addendum)
    Kimberly ManilaSusan K Budden  04/16/2017    Mrs. Kimberly [name]' procedure is scheduled on Tuesday, April 18, 2017 at 10:00 AM.   [facility] Entr[facility] "A" Admitting Office at 7:30 AM.   Call this number if you have problems the morning of surgery: [phone]   Remember:  Patient is not to eat food or drink liquids after midnight tonight.   Please have patient use her Pulmicort nebulizer, she may use her Duo-neb if needed and her Albuterol inhaler if needed. Please send patient's Albuterol nebulizer with her when she comes to the hospital.   Check patient's blood sugar the morning of your surgery when she wakes up and every 2 hours until she leaves for the hospital.  Treat a low blood sugar (less than 70 mg/dL) with 1/2 cup of clear juice (cranberry or apple), 4 glucose tablets, OR glucose gel.  Recheck blood sugar in 15 minutes after treatment (to make sure it is greater than 70 mg/dL).  If blood sugar is not greater than 70 mg/dL on re-check, call [phone] for further instructions.    THE NIGHT BEFORE SURGERY, take 15 units of Lantus Insulin.  THE MORNING OF SURGERY, take 15 units of Lantus Insulin.   If patient's CBG is greater than 220 mg/dL, give her 1/2 of her sliding scale (correction) dose of insulin.    Do not wear jewelry, make-up or nail polish.  Do not wear lotions, powders, or perfumes, or deoderant.  Do not shave 48 hours prior to surgery.    Do not bring valuables to the hospital.  [facility] is not responsible for any belongings or valuables.  Contacts, dentures or bridgework may not be worn into surgery.    Any questions when you receive this, please call me, Hyman Bibleeresa Adisynn Suleiman, RN at [phone].

## 2017-04-16 NOTE — H&P (Signed)
PREOPERATIVE H&P  Chief Complaint: Decreased hearing  HPI: Kimberly [name] is a 61 y.o. female who presents for evaluation of decreased hearing for several months despite antibiotics and flonase use. On exam she has bilateral som. She's taken to the OR for BMTs.  Past Medical History:  Diagnosis Date  . Anemia   . Anxiety   . Arthritis    "knees, feet, hands" (08/23/2016)  . Asthma   . CAD, NATIVE VESSEL    May 10, 2010 cath showed a hyperdynamic LV function, she had dominant circumflex anatomy with a 70-80% small OM1. She had diffuse diabetic plaque particularly in the distal LAD. She nondominant RCA.  Nondominant  . Cellulitis 10/15/2013  . CHF (congestive heart failure) (HCC)    Preserved EF  . Chronic bronchitis (HCC)    "probably once/ yr" (08/23/2016)  . Complication of anesthesia    "I've had difficulty waking up" (08/23/2016)  . COPD   . Depression   . Diabetic neuropathy (HCC)   . Dyspnea   . Endometrial hyperplasia   . ESRD (end stage renal disease) on dialysis (HCC)    "[facility]; MWF; [facility]" (08/23/2016)  . Family history of adverse reaction to anesthesia    mother had hard time waking up  . GERD (gastroesophageal reflux disease)   . History of hiatal hernia   . HYPERLIPIDEMIA   . HYPERTENSION   . Hypothyroidism   . Myocardial infarct, old   . On home oxygen therapy    "2L; 24/7" (08/23/2016)  . Paralyzed vocal cords   . Peripheral neuropathy    hx/notes 01/27/2010  . Pneumonia "several times"  . Proliferative retinopathy    hx/notes 01/27/2010  . PVD    CEA  . Restless leg syndrome    "mostly on the right" (08/23/2016)  . 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 Type II diabetes mellitus (HCC)    Past Surgical History:  Procedure Laterality Date  . AMPUTATION Left 02/16/2017   Procedure: Left Below Knee Amputation;  Surgeon: Nadara Mustard, MD;  Location[facility];  Service: Orthopedics;  Laterality: Left;  . AV FISTULA PLACEMENT Right 07/08/2015   Procedure: EXPLORATION RIGHT AXILLARY ARTERY AND RIGHT BRACHIAL VEIN;  Surgeon: Chuck Hint, MD;  Location: [facility] OR;  Service: Vascular;  Laterality: Right;  . AV FISTULA PLACEMENT Right 01/02/2017   Procedure: INSERTION OF ARTERIOVENOUS GORE-TEX  GRAFT  RIGHT ARM;  Surgeon: Chuck Hint, MD;  Location: [facility] OR;  Service: Vascular;  Laterality: Right;  . CAROTID ENDARTERECTOMY Left X 2  . CATARACT EXTRACTION W/ INTRAOCULAR LENS  IMPLANT, BILATERAL Bilateral   . CERVICAL BIOPSY  ~ 2015   "precancerous cells"  . COLONOSCOPY    . EYE SURGERY Bilateral    numerous surgeries  . INSERTION OF DIALYSIS CATHETER N/A 06/24/2015   Procedure: ULTRASOUND BILATERAL INTERNAL JUGULAR VEIN INSERTION OF DIALYSIS CATHETER LEFT INTERNAL JUGULAR VEIN ;  Surgeon: Pryor Ochoa, MD;  Location: [facility] OR;  Service: Vascular;  Laterality: N/A;  . INTRAUTERINE DEVICE INSERTION  ~ 2015  . IR REMOVAL TUN CV CATH W/O FL  02/15/2017  . THROMBECTOMY AND REVISION OF ARTERIOVENTOUS (AV) GORETEX  GRAFT Right 03/17/2017   Procedure: THROMBECTOMY AND REVISION OF ARM  ARTERIOVENTOUS GORETEX  GRAFT USING A BY 10CM GORTEX GRAFT ;  Surgeon: Sherren Kerns, MD;  Location: [facility] OR;  Service: Vascular;  Laterality: Right;  . VITRECTOMY Bilateral    Social History   Social History  . Marital status: Married    Spouse name: N/A  . Number of children: 0  . Years of education: N/A   Occupational History  . disabled    Social History Main Topics  . Smoking status: Former Smoker    Packs/day: 1.00    Years: 20.00    Types: Cigarettes    Quit date: 09/11/2005  . Smokeless tobacco: Never Used  . Alcohol use No  . Drug use: No  . Sexual activity: No   Other Topics Concern  . Not on file   Social History Narrative  . No narrative on file   Family History  Problem Relation Age of Onset  . Cancer  Mother        Breast, NHL  . Stroke Mother   . Peripheral vascular disease Father   . CAD Father 4730  . Heart attack Father   . Hypertension Father   . Asthma Father   . Heart disease Father        before age 62   Allergies  Allergen Reactions  . Ciprofloxacin Itching  . Epinephrine Palpitations   Prior to Admission medications   Medication Sig Start Date End Date Taking? Authorizing Provider  acetaminophen (TYLENOL) 325 MG tablet Take 2 tablets (650 mg total) by mouth every 6 (six) hours as needed for mild pain (or Fever >/= 101). 02/20/17  Yes Elgergawy, Leana Roeawood S, MD  albuterol (PROVENTIL HFA;VENTOLIN HFA) 108 (90 Base) MCG/ACT inhaler Inhale 2 puffs into the lungs every 6 (six) hours as needed for wheezing or shortness of breath.   Yes [provider]  ALPRAZolam (XANAX) 0.5 MG tablet Take 0.5 mg by mouth every 8 (eight) hours as needed for anxiety.   Yes [provider]  aspirin EC 81 MG tablet Take 81 mg by mouth daily.   Yes [provider]  budesonide (PULMICORT) 0.25 MG/2ML nebulizer solution Take 2 mLs (0.25 mg total) by nebulization 2 (two) times daily. 03/23/17  Yes Hongalgi, Maximino GreenlandAnand D, MD  collagenase (SANTYL) ointment Apply 1 application topically daily.   Yes [provider]  docusate sodium (COLACE) 100 MG capsule Take 100 mg by mouth 2 (two) times daily.   Yes [provider]  fluticasone (FLONASE) 50 MCG/ACT nasal spray Place 2 sprays into both nostrils at bedtime.   Yes [provider]  guaiFENesin-dextromethorphan (ROBITUSSIN DM) 100-10 MG/5ML syrup Take 15 mLs by mouth every 6 (six) hours as needed for cough. 03/23/17  Yes Hongalgi, Maximino GreenlandAnand D, MD  hydrocortisone (ANUSOL-HC) 2.5 % rectal cream Place 1 application rectally as needed for hemorrhoids or itching.   Yes [provider]  insulin aspart (NOVOLOG) 100 UNIT/ML injection Inject 0-15 Units into the skin 3 (three) times daily with meals. CBG < 70: implement  hypoglycemia protocol CBG 70 - 120: 0 units CBG 121 - 150: 2 units CBG 151 - 200: 3 units CBG 201 - 250: 5 units CBG 251 - 300: 8 units CBG 301 - 350: 11 units CBG 351 - 400: 15 units CBG > 400 call MD. Patient taking differently: Inject 0-15 Units into the skin 3 (three) times daily with meals. CBG < 70: implement hypoglycemia protocol CBG 0 - 150: 0 units CBG 151 - 200: 2 units CBG 201 - 250: 4 units CBG 251 - 300: 6 units CBG 301 - 350: 8 units  CBG 351 - 400: 10 units CBG > 400 call MD. 03/23/17  Yes Hongalgi, Maximino Greenland, MD  insulin glargine (LANTUS) 100 UNIT/ML injection Inject 0.3 mLs (30 Units total) into the skin 2 (two) times daily. 03/23/17  Yes Hongalgi, Maximino Greenland, MD  ipratropium-albuterol (DUONEB) 0.5-2.5 (3) MG/3ML SOLN Take 3 mLs by nebulization every 4 (four) hours as needed. Wheezing or dyspnea. 03/23/17  Yes Hongalgi, Maximino Greenland, MD  levothyroxine (SYNTHROID, LEVOTHROID) 88 MCG tablet Take 1 tablet (88 mcg total) by mouth daily before breakfast. 10/10/16  Yes Ngetich, Dinah C, NP  lidocaine-prilocaine (EMLA) cream Apply 1 application topically See admin instructions. Apply on Monday, Wednesday and Friday for pain prior to dialysis  The cream is 2.5 %   Yes [provider]  midodrine (PROAMATINE) 10 MG tablet Take 1 tablet (10 mg total) by mouth daily. 02/20/17  Yes Elgergawy, Leana Roe, MD  Multiple Vitamins-Minerals (MULTIVITAMIN ADULT) CHEW Chew 1 tablet by mouth daily.   Yes [provider]  pantoprazole (PROTONIX) 40 MG tablet Take 1 tablet (40 mg total) by mouth 2 (two) times daily before a meal. 03/23/17  Yes Hongalgi, Maximino Greenland, MD  polyethylene glycol (MIRALAX / GLYCOLAX) packet Take 17 g by mouth daily as needed for mild constipation. 02/20/17  Yes Elgergawy, Leana Roe, MD  senna (SENOKOT) 8.6 MG tablet Take 2 tablets (17.2 mg total) by mouth at bedtime as needed for constipation. 02/20/17  Yes Elgergawy, Leana Roe, MD  sevelamer carbonate (RENVELA) 2.4 g PACK Take  2.4 g by mouth 3 (three) times daily with meals. 08/31/16  Yes Dorothea Ogle, MD  traMADol (ULTRAM) 50 MG tablet Take 1 tablet (50 mg total) by mouth every 6 (six) hours as needed for moderate pain or severe pain. 03/23/17  Yes Hongalgi, Maximino Greenland, MD  OXYGEN 2lpm 24/7- [facility]    [provider]     Positive ROS: decreased hearing with CHL  All other systems have been reviewed and were otherwise negative with the exception of those mentioned in the HPI and as above.  Physical Exam: There were no vitals filed for this visit.  General: Alert, no acute distress Oral: Normal oral mucosa and tonsils Nasal: Clear nasal passages Neck: No palpable adenopathy or thyroid nodules Ear: Bilateral OME with BC>AC on tuning fork testing  Cardiovascular: Regular rate and rhythm, no murmur.  Respiratory: Clear to auscultation Neurologic: Alert and oriented x 3   Assessment/Plan: left below knee amputation dehiscence Plan for Procedure(s): LEFT ABOVE KNEE AMPUTATION MYRINGOTOMY WITH TUBE PLACEMENT   Dillard Cannon, MD 04/16/2017 5:06 PM

## 2017-04-17 ENCOUNTER — Inpatient Hospital Stay (HOSPITAL_COMMUNITY): Payer: Medicare Other | Admitting: Anesthesiology

## 2017-04-17 ENCOUNTER — Inpatient Hospital Stay (HOSPITAL_COMMUNITY)
Admission: RE | Admit: 2017-04-17 | Discharge: 2017-04-21 | DRG: 474 | Disposition: A | Discharge status: Skilled Nursing Facility | Payer: Medicare Other | Source: Ambulatory Visit | Attending: Orthopedic Surgery | Admitting: Orthopedic Surgery

## 2017-04-17 ENCOUNTER — Encounter (HOSPITAL_COMMUNITY)
Admission: RE | Disposition: A | Discharge status: Skilled Nursing Facility | Payer: Self-pay | Source: Ambulatory Visit | Attending: Orthopedic Surgery

## 2017-04-17 DIAGNOSIS — J440 Chronic obstructive pulmonary disease with acute lower respiratory infection: Secondary | ICD-10-CM | POA: Diagnosis present

## 2017-04-17 DIAGNOSIS — N2581 Secondary hyperparathyroidism of renal origin: Secondary | ICD-10-CM | POA: Diagnosis present

## 2017-04-17 DIAGNOSIS — S8990XA Unspecified injury of unspecified lower leg, initial encounter: Secondary | ICD-10-CM | POA: Diagnosis not present

## 2017-04-17 DIAGNOSIS — J3800 Paralysis of vocal cords and larynx, unspecified: Secondary | ICD-10-CM | POA: Diagnosis present

## 2017-04-17 DIAGNOSIS — H6533 Chronic mucoid otitis media, bilateral: Secondary | ICD-10-CM | POA: Diagnosis not present

## 2017-04-17 DIAGNOSIS — Z888 Allergy status to other drugs, medicaments and biological substances status: Secondary | ICD-10-CM

## 2017-04-17 DIAGNOSIS — Z89512 Acquired absence of left leg below knee: Secondary | ICD-10-CM

## 2017-04-17 DIAGNOSIS — G2581 Restless legs syndrome: Secondary | ICD-10-CM | POA: Diagnosis present

## 2017-04-17 DIAGNOSIS — Z961 Presence of intraocular lens: Secondary | ICD-10-CM | POA: Diagnosis present

## 2017-04-17 DIAGNOSIS — A419 Sepsis, unspecified organism: Secondary | ICD-10-CM | POA: Diagnosis not present

## 2017-04-17 DIAGNOSIS — Z9981 Dependence on supplemental oxygen: Secondary | ICD-10-CM

## 2017-04-17 DIAGNOSIS — E6609 Other obesity due to excess calories: Secondary | ICD-10-CM | POA: Diagnosis not present

## 2017-04-17 DIAGNOSIS — I252 Old myocardial infarction: Secondary | ICD-10-CM

## 2017-04-17 DIAGNOSIS — H6523 Chronic serous otitis media, bilateral: Secondary | ICD-10-CM | POA: Diagnosis present

## 2017-04-17 DIAGNOSIS — I110 Hypertensive heart disease with heart failure: Secondary | ICD-10-CM | POA: Diagnosis not present

## 2017-04-17 DIAGNOSIS — I2510 Atherosclerotic heart disease of native coronary artery without angina pectoris: Secondary | ICD-10-CM | POA: Diagnosis present

## 2017-04-17 DIAGNOSIS — Y835 Amputation of limb(s) as the cause of abnormal reaction of the patient, or of later complication, without mention of misadventure at the time of the procedure: Secondary | ICD-10-CM | POA: Diagnosis present

## 2017-04-17 DIAGNOSIS — Z6837 Body mass index (BMI) 37.0-37.9, adult: Secondary | ICD-10-CM

## 2017-04-17 DIAGNOSIS — Z7982 Long term (current) use of aspirin: Secondary | ICD-10-CM

## 2017-04-17 DIAGNOSIS — L03116 Cellulitis of left lower limb: Secondary | ICD-10-CM | POA: Diagnosis not present

## 2017-04-17 DIAGNOSIS — I509 Heart failure, unspecified: Secondary | ICD-10-CM | POA: Diagnosis not present

## 2017-04-17 DIAGNOSIS — D649 Anemia, unspecified: Secondary | ICD-10-CM | POA: Diagnosis present

## 2017-04-17 DIAGNOSIS — Z7951 Long term (current) use of inhaled steroids: Secondary | ICD-10-CM

## 2017-04-17 DIAGNOSIS — Z794 Long term (current) use of insulin: Secondary | ICD-10-CM

## 2017-04-17 DIAGNOSIS — Z992 Dependence on renal dialysis: Secondary | ICD-10-CM

## 2017-04-17 DIAGNOSIS — Z22322 Carrier or suspected carrier of Methicillin resistant Staphylococcus aureus: Secondary | ICD-10-CM | POA: Diagnosis not present

## 2017-04-17 DIAGNOSIS — K219 Gastro-esophageal reflux disease without esophagitis: Secondary | ICD-10-CM | POA: Diagnosis present

## 2017-04-17 DIAGNOSIS — F418 Other specified anxiety disorders: Secondary | ICD-10-CM | POA: Diagnosis present

## 2017-04-17 DIAGNOSIS — S78112A Complete traumatic amputation at level between left hip and knee, initial encounter: Secondary | ICD-10-CM

## 2017-04-17 DIAGNOSIS — N186 End stage renal disease: Secondary | ICD-10-CM | POA: Diagnosis present

## 2017-04-17 DIAGNOSIS — K449 Diaphragmatic hernia without obstruction or gangrene: Secondary | ICD-10-CM | POA: Diagnosis present

## 2017-04-17 DIAGNOSIS — Z79891 Long term (current) use of opiate analgesic: Secondary | ICD-10-CM

## 2017-04-17 DIAGNOSIS — L03119 Cellulitis of unspecified part of limb: Secondary | ICD-10-CM | POA: Diagnosis not present

## 2017-04-17 DIAGNOSIS — J189 Pneumonia, unspecified organism: Secondary | ICD-10-CM | POA: Diagnosis present

## 2017-04-17 DIAGNOSIS — E669 Obesity, unspecified: Secondary | ICD-10-CM | POA: Diagnosis present

## 2017-04-17 DIAGNOSIS — I120 Hypertensive chronic kidney disease with stage 5 chronic kidney disease or end stage renal disease: Secondary | ICD-10-CM | POA: Diagnosis present

## 2017-04-17 DIAGNOSIS — Z87891 Personal history of nicotine dependence: Secondary | ICD-10-CM

## 2017-04-17 DIAGNOSIS — F329 Major depressive disorder, single episode, unspecified: Secondary | ICD-10-CM | POA: Diagnosis present

## 2017-04-17 DIAGNOSIS — Z823 Family history of stroke: Secondary | ICD-10-CM

## 2017-04-17 DIAGNOSIS — E039 Hypothyroidism, unspecified: Secondary | ICD-10-CM | POA: Diagnosis present

## 2017-04-17 DIAGNOSIS — R062 Wheezing: Secondary | ICD-10-CM

## 2017-04-17 DIAGNOSIS — R059 Cough, unspecified: Secondary | ICD-10-CM

## 2017-04-17 DIAGNOSIS — R05 Cough: Secondary | ICD-10-CM

## 2017-04-17 DIAGNOSIS — E11621 Type 2 diabetes mellitus with foot ulcer: Secondary | ICD-10-CM | POA: Diagnosis present

## 2017-04-17 DIAGNOSIS — I959 Hypotension, unspecified: Secondary | ICD-10-CM | POA: Diagnosis not present

## 2017-04-17 DIAGNOSIS — E1140 Type 2 diabetes mellitus with diabetic neuropathy, unspecified: Secondary | ICD-10-CM | POA: Diagnosis present

## 2017-04-17 DIAGNOSIS — S78119A Complete traumatic amputation at level between unspecified hip and knee, initial encounter: Secondary | ICD-10-CM | POA: Diagnosis not present

## 2017-04-17 DIAGNOSIS — E662 Morbid (severe) obesity with alveolar hypoventilation: Secondary | ICD-10-CM | POA: Diagnosis not present

## 2017-04-17 DIAGNOSIS — F419 Anxiety disorder, unspecified: Secondary | ICD-10-CM | POA: Diagnosis present

## 2017-04-17 DIAGNOSIS — Z8249 Family history of ischemic heart disease and other diseases of the circulatory system: Secondary | ICD-10-CM

## 2017-04-17 DIAGNOSIS — Z881 Allergy status to other antibiotic agents status: Secondary | ICD-10-CM

## 2017-04-17 DIAGNOSIS — Z79899 Other long term (current) drug therapy: Secondary | ICD-10-CM

## 2017-04-17 DIAGNOSIS — R69 Illness, unspecified: Secondary | ICD-10-CM | POA: Diagnosis not present

## 2017-04-17 DIAGNOSIS — E113599 Type 2 diabetes mellitus with proliferative diabetic retinopathy without macular edema, unspecified eye: Secondary | ICD-10-CM | POA: Diagnosis present

## 2017-04-17 DIAGNOSIS — I5032 Chronic diastolic (congestive) heart failure: Secondary | ICD-10-CM | POA: Diagnosis not present

## 2017-04-17 DIAGNOSIS — E1129 Type 2 diabetes mellitus with other diabetic kidney complication: Secondary | ICD-10-CM | POA: Diagnosis not present

## 2017-04-17 DIAGNOSIS — L97523 Non-pressure chronic ulcer of other part of left foot with necrosis of muscle: Secondary | ICD-10-CM | POA: Diagnosis not present

## 2017-04-17 DIAGNOSIS — Z955 Presence of coronary angioplasty implant and graft: Secondary | ICD-10-CM

## 2017-04-17 DIAGNOSIS — T8781 Dehiscence of amputation stump: Principal | ICD-10-CM | POA: Diagnosis present

## 2017-04-17 DIAGNOSIS — E1151 Type 2 diabetes mellitus with diabetic peripheral angiopathy without gangrene: Secondary | ICD-10-CM | POA: Diagnosis present

## 2017-04-17 DIAGNOSIS — Z825 Family history of asthma and other chronic lower respiratory diseases: Secondary | ICD-10-CM

## 2017-04-17 DIAGNOSIS — Z01810 Encounter for preprocedural cardiovascular examination: Secondary | ICD-10-CM | POA: Diagnosis not present

## 2017-04-17 DIAGNOSIS — E785 Hyperlipidemia, unspecified: Secondary | ICD-10-CM | POA: Diagnosis present

## 2017-04-17 DIAGNOSIS — Z89612 Acquired absence of left leg above knee: Secondary | ICD-10-CM | POA: Diagnosis not present

## 2017-04-17 DIAGNOSIS — K640 First degree hemorrhoids: Secondary | ICD-10-CM | POA: Diagnosis not present

## 2017-04-17 DIAGNOSIS — T8131XA Disruption of external operation (surgical) wound, not elsewhere classified, initial encounter: Secondary | ICD-10-CM | POA: Diagnosis not present

## 2017-04-17 DIAGNOSIS — I480 Paroxysmal atrial fibrillation: Secondary | ICD-10-CM | POA: Diagnosis not present

## 2017-04-17 DIAGNOSIS — J449 Chronic obstructive pulmonary disease, unspecified: Secondary | ICD-10-CM | POA: Diagnosis not present

## 2017-04-17 DIAGNOSIS — E1122 Type 2 diabetes mellitus with diabetic chronic kidney disease: Secondary | ICD-10-CM | POA: Diagnosis present

## 2017-04-17 DIAGNOSIS — I10 Essential (primary) hypertension: Secondary | ICD-10-CM | POA: Diagnosis not present

## 2017-04-17 DIAGNOSIS — D631 Anemia in chronic kidney disease: Secondary | ICD-10-CM | POA: Diagnosis not present

## 2017-04-17 DIAGNOSIS — J9611 Chronic respiratory failure with hypoxia: Secondary | ICD-10-CM | POA: Diagnosis not present

## 2017-04-17 HISTORY — PX: ABOVE KNEE LEG AMPUTATION: SUR20

## 2017-04-17 HISTORY — PX: AMPUTATION: SHX166

## 2017-04-17 HISTORY — PX: MYRINGOTOMY WITH TUBE PLACEMENT: SHX5663

## 2017-04-17 HISTORY — PX: APPLICATION OF WOUND VAC: SHX5189

## 2017-04-17 LAB — POCT I-STAT, CHEM 8
BUN: 20 mg/dL (ref 6–20)
CALCIUM ION: 1 mmol/L — AB (ref 1.15–1.40)
CREATININE: 4.1 mg/dL — AB (ref 0.44–1.00)
Chloride: 100 mmol/L — ABNORMAL LOW (ref 101–111)
Glucose, Bld: 179 mg/dL — ABNORMAL HIGH (ref 65–99)
HEMATOCRIT: 39 % (ref 36.0–46.0)
HEMOGLOBIN: 13.3 g/dL (ref 12.0–15.0)
Potassium: 4.2 mmol/L (ref 3.5–5.1)
Sodium: 135 mmol/L (ref 135–145)
TCO2: 28 mmol/L (ref 0–100)

## 2017-04-17 LAB — GLUCOSE, CAPILLARY
GLUCOSE-CAPILLARY: 244 mg/dL — AB (ref 65–99)
GLUCOSE-CAPILLARY: 443 mg/dL — AB (ref 65–99)
Glucose-Capillary: 166 mg/dL — ABNORMAL HIGH (ref 65–99)
Glucose-Capillary: 291 mg/dL — ABNORMAL HIGH (ref 65–99)

## 2017-04-17 SURGERY — AMPUTATION, ABOVE KNEE
Anesthesia: General | Site: Leg Upper | Laterality: Left

## 2017-04-17 MED ORDER — DIPHENHYDRAMINE HCL 50 MG/ML IJ SOLN
INTRAMUSCULAR | Status: AC
  Filled 2017-04-17: qty 1

## 2017-04-17 MED ORDER — CIPROFLOXACIN-DEXAMETHASONE 0.3-0.1 % OT SUSP
OTIC | Status: DC | PRN
  Administered 2017-04-17: 4 [drp] via OTIC

## 2017-04-17 MED ORDER — ACETAMINOPHEN 325 MG PO TABS: 650.0000 mg | ORAL_TABLET | Freq: Four times a day (QID) | ORAL | Status: DC | PRN

## 2017-04-17 MED ORDER — ALPRAZOLAM 0.5 MG PO TABS
0.5000 mg | ORAL_TABLET | Freq: Three times a day (TID) | ORAL | Status: DC | PRN
  Administered 2017-04-19 – 2017-04-20 (×2): 0.5 mg via ORAL
  Filled 2017-04-17 (×2): qty 1

## 2017-04-17 MED ORDER — EPHEDRINE SULFATE 50 MG/ML IJ SOLN
INTRAMUSCULAR | Status: DC | PRN
  Administered 2017-04-17: 10 mg via INTRAVENOUS

## 2017-04-17 MED ORDER — PROMETHAZINE HCL 25 MG/ML IJ SOLN
6.2500 mg | INTRAMUSCULAR | Status: DC | PRN
  Administered 2017-04-17: 6.25 mg via INTRAVENOUS

## 2017-04-17 MED ORDER — IPRATROPIUM-ALBUTEROL 0.5-2.5 (3) MG/3ML IN SOLN: 3.0000 mL | RESPIRATORY_TRACT | Status: DC | PRN

## 2017-04-17 MED ORDER — BUDESONIDE 0.25 MG/2ML IN SUSP
0.2500 mg | Freq: Two times a day (BID) | RESPIRATORY_TRACT | Status: DC
  Administered 2017-04-17 – 2017-04-21 (×6): 0.25 mg via RESPIRATORY_TRACT
  Filled 2017-04-17 (×9): qty 2

## 2017-04-17 MED ORDER — METOCLOPRAMIDE HCL 5 MG PO TABS: 5.0000 mg | ORAL_TABLET | Freq: Three times a day (TID) | ORAL | Status: DC | PRN

## 2017-04-17 MED ORDER — DEXAMETHASONE SODIUM PHOSPHATE 10 MG/ML IJ SOLN
INTRAMUSCULAR | Status: AC
  Filled 2017-04-17: qty 1

## 2017-04-17 MED ORDER — LIDOCAINE HCL (CARDIAC) 20 MG/ML IV SOLN
INTRAVENOUS | Status: DC | PRN
  Administered 2017-04-17: 100 mg via INTRAVENOUS

## 2017-04-17 MED ORDER — OXYCODONE HCL 5 MG PO TABS
5.0000 mg | ORAL_TABLET | ORAL | Status: DC | PRN
  Administered 2017-04-17 (×2): 10 mg via ORAL
  Filled 2017-04-17 (×2): qty 2

## 2017-04-17 MED ORDER — FLUTICASONE PROPIONATE 50 MCG/ACT NA SUSP
2.0000 | Freq: Every day | NASAL | Status: DC
  Administered 2017-04-17 – 2017-04-20 (×4): 2 via NASAL
  Filled 2017-04-17: qty 16

## 2017-04-17 MED ORDER — MAGNESIUM CITRATE PO SOLN: 1.0000 | Freq: Once | ORAL | Status: DC | PRN

## 2017-04-17 MED ORDER — MIDAZOLAM HCL 2 MG/2ML IJ SOLN
INTRAMUSCULAR | Status: AC
  Filled 2017-04-17: qty 2

## 2017-04-17 MED ORDER — CHLORHEXIDINE GLUCONATE 4 % EX LIQD: 60.0000 mL | Freq: Once | CUTANEOUS | Status: DC

## 2017-04-17 MED ORDER — HYDROMORPHONE HCL 1 MG/ML IJ SOLN
INTRAMUSCULAR | Status: AC
  Administered 2017-04-17: 0.25 mg via INTRAVENOUS
  Filled 2017-04-17: qty 1

## 2017-04-17 MED ORDER — ROCURONIUM BROMIDE 10 MG/ML (PF) SYRINGE
PREFILLED_SYRINGE | INTRAVENOUS | Status: AC
  Filled 2017-04-17: qty 5

## 2017-04-17 MED ORDER — ALBUTEROL SULFATE (2.5 MG/3ML) 0.083% IN NEBU
2.5000 mg | INHALATION_SOLUTION | Freq: Four times a day (QID) | RESPIRATORY_TRACT | Status: DC | PRN
  Administered 2017-04-20: 2.5 mg via RESPIRATORY_TRACT
  Filled 2017-04-17: qty 3

## 2017-04-17 MED ORDER — ACETAMINOPHEN 650 MG RE SUPP: 650.0000 mg | Freq: Four times a day (QID) | RECTAL | Status: DC | PRN

## 2017-04-17 MED ORDER — PROPOFOL 10 MG/ML IV BOLUS
INTRAVENOUS | Status: AC
  Filled 2017-04-17: qty 40

## 2017-04-17 MED ORDER — LEVOTHYROXINE SODIUM 88 MCG PO TABS
88.0000 ug | ORAL_TABLET | Freq: Every day | ORAL | Status: DC
  Administered 2017-04-18 – 2017-04-21 (×4): 88 ug via ORAL
  Filled 2017-04-17 (×4): qty 1

## 2017-04-17 MED ORDER — METHOCARBAMOL 1000 MG/10ML IJ SOLN: 500.0000 mg | Freq: Four times a day (QID) | INTRAVENOUS | Status: DC | PRN

## 2017-04-17 MED ORDER — LIDOCAINE 2% (20 MG/ML) 5 ML SYRINGE
INTRAMUSCULAR | Status: AC
  Filled 2017-04-17: qty 5

## 2017-04-17 MED ORDER — SODIUM CHLORIDE 0.9 % IV SOLN
INTRAVENOUS | Status: DC
  Administered 2017-04-17 (×2): via INTRAVENOUS

## 2017-04-17 MED ORDER — SODIUM CHLORIDE 0.9 % IV SOLN: INTRAVENOUS | Status: DC

## 2017-04-17 MED ORDER — DIPHENHYDRAMINE HCL 50 MG/ML IJ SOLN
INTRAMUSCULAR | Status: DC | PRN
  Administered 2017-04-17: 12.5 mg via INTRAVENOUS

## 2017-04-17 MED ORDER — CEFAZOLIN SODIUM-DEXTROSE 2-4 GM/100ML-% IV SOLN
2.0000 g | INTRAVENOUS | Status: AC
  Administered 2017-04-17: 2 g via INTRAVENOUS
  Filled 2017-04-17: qty 100

## 2017-04-17 MED ORDER — INSULIN GLARGINE 100 UNIT/ML ~~LOC~~ SOLN
20.0000 [IU] | Freq: Every day | SUBCUTANEOUS | Status: DC
  Administered 2017-04-17 – 2017-04-20 (×4): 20 [IU] via SUBCUTANEOUS
  Filled 2017-04-17 (×4): qty 0.2

## 2017-04-17 MED ORDER — INSULIN ASPART 100 UNIT/ML ~~LOC~~ SOLN
4.0000 [IU] | Freq: Three times a day (TID) | SUBCUTANEOUS | Status: DC
  Administered 2017-04-17 – 2017-04-21 (×7): 4 [IU] via SUBCUTANEOUS

## 2017-04-17 MED ORDER — METOCLOPRAMIDE HCL 5 MG/ML IJ SOLN: 5.0000 mg | Freq: Three times a day (TID) | INTRAMUSCULAR | Status: DC | PRN

## 2017-04-17 MED ORDER — PANTOPRAZOLE SODIUM 40 MG PO TBEC
40.0000 mg | DELAYED_RELEASE_TABLET | Freq: Two times a day (BID) | ORAL | Status: DC
  Administered 2017-04-17 – 2017-04-21 (×6): 40 mg via ORAL
  Filled 2017-04-17 (×6): qty 1

## 2017-04-17 MED ORDER — ONDANSETRON HCL 4 MG/2ML IJ SOLN: 4.0000 mg | Freq: Four times a day (QID) | INTRAMUSCULAR | Status: DC | PRN

## 2017-04-17 MED ORDER — ASPIRIN EC 81 MG PO TBEC
81.0000 mg | DELAYED_RELEASE_TABLET | Freq: Every day | ORAL | Status: DC
  Administered 2017-04-17 – 2017-04-21 (×5): 81 mg via ORAL
  Filled 2017-04-17 (×5): qty 1

## 2017-04-17 MED ORDER — ONDANSETRON HCL 4 MG PO TABS: 4.0000 mg | ORAL_TABLET | Freq: Four times a day (QID) | ORAL | Status: DC | PRN

## 2017-04-17 MED ORDER — PHENYLEPHRINE HCL 10 MG/ML IJ SOLN
INTRAMUSCULAR | Status: DC | PRN
  Administered 2017-04-17: 50 ug/min via INTRAVENOUS

## 2017-04-17 MED ORDER — PHENYLEPHRINE 40 MCG/ML (10ML) SYRINGE FOR IV PUSH (FOR BLOOD PRESSURE SUPPORT)
PREFILLED_SYRINGE | INTRAVENOUS | Status: AC
  Filled 2017-04-17: qty 30

## 2017-04-17 MED ORDER — ALBUMIN HUMAN 5 % IV SOLN
INTRAVENOUS | Status: DC | PRN
  Administered 2017-04-17: 12:00:00 via INTRAVENOUS

## 2017-04-17 MED ORDER — FENTANYL CITRATE (PF) 250 MCG/5ML IJ SOLN
INTRAMUSCULAR | Status: AC
  Filled 2017-04-17: qty 5

## 2017-04-17 MED ORDER — BISACODYL 10 MG RE SUPP: 10.0000 mg | Freq: Every day | RECTAL | Status: DC | PRN

## 2017-04-17 MED ORDER — DEXAMETHASONE SODIUM PHOSPHATE 10 MG/ML IJ SOLN
INTRAMUSCULAR | Status: DC | PRN
  Administered 2017-04-17: 4 mg via INTRAVENOUS

## 2017-04-17 MED ORDER — INSULIN ASPART 100 UNIT/ML ~~LOC~~ SOLN
0.0000 [IU] | Freq: Three times a day (TID) | SUBCUTANEOUS | Status: DC
  Administered 2017-04-17: 8 [IU] via SUBCUTANEOUS
  Administered 2017-04-18: 15 [IU] via SUBCUTANEOUS
  Administered 2017-04-18: 5 [IU] via SUBCUTANEOUS
  Administered 2017-04-19: 3 [IU] via SUBCUTANEOUS
  Administered 2017-04-19: 4 [IU] via SUBCUTANEOUS
  Administered 2017-04-19: 2 [IU] via SUBCUTANEOUS
  Administered 2017-04-20: 3 [IU] via SUBCUTANEOUS
  Administered 2017-04-20: 5 [IU] via SUBCUTANEOUS
  Administered 2017-04-21: 2 [IU] via SUBCUTANEOUS
  Administered 2017-04-21: 3 [IU] via SUBCUTANEOUS

## 2017-04-17 MED ORDER — EPINEPHRINE PF 1 MG/10ML IJ SOSY
PREFILLED_SYRINGE | INTRAMUSCULAR | Status: AC
  Filled 2017-04-17: qty 10

## 2017-04-17 MED ORDER — PROPOFOL 10 MG/ML IV BOLUS
INTRAVENOUS | Status: DC | PRN
  Administered 2017-04-17: 130 mg via INTRAVENOUS

## 2017-04-17 MED ORDER — METHOCARBAMOL 500 MG PO TABS
500.0000 mg | ORAL_TABLET | Freq: Four times a day (QID) | ORAL | Status: DC | PRN
  Administered 2017-04-17 – 2017-04-18 (×2): 500 mg via ORAL
  Filled 2017-04-17 (×2): qty 1

## 2017-04-17 MED ORDER — MIDODRINE HCL 5 MG PO TABS
10.0000 mg | ORAL_TABLET | Freq: Every day | ORAL | Status: DC
  Administered 2017-04-17 – 2017-04-21 (×5): 10 mg via ORAL
  Filled 2017-04-17 (×5): qty 2

## 2017-04-17 MED ORDER — CIPROFLOXACIN-DEXAMETHASONE 0.3-0.1 % OT SUSP
OTIC | Status: AC
  Filled 2017-04-17: qty 7.5

## 2017-04-17 MED ORDER — ONDANSETRON HCL 4 MG/2ML IJ SOLN
INTRAMUSCULAR | Status: DC | PRN
  Administered 2017-04-17: 4 mg via INTRAVENOUS

## 2017-04-17 MED ORDER — CEFAZOLIN SODIUM-DEXTROSE 2-4 GM/100ML-% IV SOLN
2.0000 g | Freq: Four times a day (QID) | INTRAVENOUS | Status: AC
  Administered 2017-04-17 – 2017-04-18 (×3): 2 g via INTRAVENOUS
  Filled 2017-04-17 (×3): qty 100

## 2017-04-17 MED ORDER — HYDROMORPHONE HCL 1 MG/ML IJ SOLN
0.2500 mg | INTRAMUSCULAR | Status: DC | PRN
  Administered 2017-04-17: 0.25 mg via INTRAVENOUS
  Administered 2017-04-17 (×2): 0.5 mg via INTRAVENOUS
  Administered 2017-04-17 (×3): 0.25 mg via INTRAVENOUS

## 2017-04-17 MED ORDER — SUCCINYLCHOLINE CHLORIDE 20 MG/ML IJ SOLN
INTRAMUSCULAR | Status: DC | PRN
  Administered 2017-04-17: 120 mg via INTRAVENOUS

## 2017-04-17 MED ORDER — STERILE WATER FOR IRRIGATION IR SOLN
Status: DC | PRN
  Administered 2017-04-17: 1000 mL

## 2017-04-17 MED ORDER — PHENYLEPHRINE HCL 10 MG/ML IJ SOLN
INTRAMUSCULAR | Status: DC | PRN
  Administered 2017-04-17: 80 ug via INTRAVENOUS
  Administered 2017-04-17: 120 ug via INTRAVENOUS
  Administered 2017-04-17: 200 ug via INTRAVENOUS

## 2017-04-17 MED ORDER — DOCUSATE SODIUM 100 MG PO CAPS
100.0000 mg | ORAL_CAPSULE | Freq: Two times a day (BID) | ORAL | Status: DC
  Administered 2017-04-17 – 2017-04-20 (×3): 100 mg via ORAL
  Filled 2017-04-17 (×7): qty 1

## 2017-04-17 MED ORDER — CHLORHEXIDINE GLUCONATE CLOTH 2 % EX PADS: 6.0000 | MEDICATED_PAD | Freq: Once | CUTANEOUS | Status: DC

## 2017-04-17 MED ORDER — PROMETHAZINE HCL 25 MG/ML IJ SOLN
INTRAMUSCULAR | Status: AC
  Administered 2017-04-17: 6.25 mg via INTRAVENOUS
  Filled 2017-04-17: qty 1

## 2017-04-17 MED ORDER — SUCCINYLCHOLINE CHLORIDE 200 MG/10ML IV SOSY
PREFILLED_SYRINGE | INTRAVENOUS | Status: AC
  Filled 2017-04-17: qty 10

## 2017-04-17 MED ORDER — FENTANYL CITRATE (PF) 100 MCG/2ML IJ SOLN
INTRAMUSCULAR | Status: DC | PRN
  Administered 2017-04-17: 150 ug via INTRAVENOUS
  Administered 2017-04-17: 50 ug via INTRAVENOUS

## 2017-04-17 MED ORDER — HYDROMORPHONE HCL 1 MG/ML IJ SOLN
1.0000 mg | INTRAMUSCULAR | Status: DC | PRN
  Administered 2017-04-19 (×2): 1 mg via INTRAVENOUS
  Filled 2017-04-17 (×2): qty 1

## 2017-04-17 MED ORDER — POLYETHYLENE GLYCOL 3350 17 G PO PACK: 17.0000 g | PACK | Freq: Every day | ORAL | Status: DC | PRN

## 2017-04-17 MED ORDER — ALBUTEROL SULFATE HFA 108 (90 BASE) MCG/ACT IN AERS
INHALATION_SPRAY | RESPIRATORY_TRACT | Status: DC | PRN
  Administered 2017-04-17: 6 via RESPIRATORY_TRACT
  Administered 2017-04-17: 5 via RESPIRATORY_TRACT

## 2017-04-17 MED ORDER — 0.9 % SODIUM CHLORIDE (POUR BTL) OPTIME
TOPICAL | Status: DC | PRN
  Administered 2017-04-17: 1000 mL

## 2017-04-17 MED ORDER — ONDANSETRON HCL 4 MG/2ML IJ SOLN
INTRAMUSCULAR | Status: AC
  Filled 2017-04-17: qty 2

## 2017-04-17 MED ORDER — EPHEDRINE 5 MG/ML INJ
INTRAVENOUS | Status: AC
  Filled 2017-04-17: qty 10

## 2017-04-17 SURGICAL SUPPLY — 60 items
BLADE SAW RECIP 87.9 MT (BLADE) IMPLANT
BLADE SURG 15 STRL LF DISP TIS (BLADE) IMPLANT
BLADE SURG 15 STRL SS (BLADE)
BLADE SURG 21 STRL SS (BLADE) ×4 IMPLANT
BNDG COHESIVE 6X5 TAN STRL LF (GAUZE/BANDAGES/DRESSINGS) ×4 IMPLANT
BNDG GAUZE ELAST 4 BULKY (GAUZE/BANDAGES/DRESSINGS) IMPLANT
CANISTER SUCT 3000ML PPV (MISCELLANEOUS) ×4 IMPLANT
CANISTER WOUND CARE 500ML ATS (WOUND CARE) ×4 IMPLANT
COTTONBALL LRG STERILE PKG (GAUZE/BANDAGES/DRESSINGS) ×4 IMPLANT
COVER MAYO STAND STRL (DRAPES) ×8 IMPLANT
COVER SURGICAL LIGHT HANDLE (MISCELLANEOUS) ×8 IMPLANT
CUFF TOURNIQUET SINGLE 34IN LL (TOURNIQUET CUFF) IMPLANT
CUFF TOURNIQUET SINGLE 44IN (TOURNIQUET CUFF) IMPLANT
DRAIN PENROSE 1/2X12 LTX STRL (WOUND CARE) IMPLANT
DRAPE HALF SHEET 40X57 (DRAPES) ×4 IMPLANT
DRAPE INCISE IOBAN 66X45 STRL (DRAPES) ×8 IMPLANT
DRAPE U-SHAPE 47X51 STRL (DRAPES) ×4 IMPLANT
DRESSING PREVENA PLUS CUSTOM (GAUZE/BANDAGES/DRESSINGS) ×2 IMPLANT
DRSG ADAPTIC 3X8 NADH LF (GAUZE/BANDAGES/DRESSINGS) IMPLANT
DRSG PAD ABDOMINAL 8X10 ST (GAUZE/BANDAGES/DRESSINGS) IMPLANT
DRSG PREVENA PLUS CUSTOM (GAUZE/BANDAGES/DRESSINGS) ×4
DURAPREP 26ML APPLICATOR (WOUND CARE) ×4 IMPLANT
ELECT CAUTERY BLADE 6.4 (BLADE) ×4 IMPLANT
ELECT REM PT RETURN 9FT ADLT (ELECTROSURGICAL) ×4
ELECTRODE REM PT RTRN 9FT ADLT (ELECTROSURGICAL) ×2 IMPLANT
EVACUATOR 1/8 PVC DRAIN (DRAIN) IMPLANT
GAUZE SPONGE 4X4 12PLY STRL (GAUZE/BANDAGES/DRESSINGS) ×4 IMPLANT
GLOVE BIOGEL PI IND STRL 9 (GLOVE) ×4 IMPLANT
GLOVE BIOGEL PI INDICATOR 9 (GLOVE) ×4
GLOVE SS BIOGEL STRL SZ 7.5 (GLOVE) ×2 IMPLANT
GLOVE SUPERSENSE BIOGEL SZ 7.5 (GLOVE) ×2
GLOVE SURG ORTHO 9.0 STRL STRW (GLOVE) ×8 IMPLANT
GOWN STRL REUS W/ TWL XL LVL3 (GOWN DISPOSABLE) ×4 IMPLANT
GOWN STRL REUS W/TWL XL LVL3 (GOWN DISPOSABLE) ×4
KIT BASIN OR (CUSTOM PROCEDURE TRAY) ×4 IMPLANT
KIT ROOM TURNOVER OR (KITS) ×8 IMPLANT
MANIFOLD NEPTUNE II (INSTRUMENTS) ×4 IMPLANT
NEEDLE HYPO 25GX1X1/2 BEV (NEEDLE) IMPLANT
NS IRRIG 1000ML POUR BTL (IV SOLUTION) ×4 IMPLANT
PACK ORTHO EXTREMITY (CUSTOM PROCEDURE TRAY) ×4 IMPLANT
PAD ARMBOARD 7.5X6 YLW CONV (MISCELLANEOUS) ×16 IMPLANT
PROS SHEEHY TY XOMED (OTOLOGIC RELATED)
STAPLER VISISTAT 35W (STAPLE) ×4 IMPLANT
STOCKINETTE IMPERVIOUS LG (DRAPES) ×4 IMPLANT
SUT ETHILON 2 0 PSLX (SUTURE) ×16 IMPLANT
SUT PDS AB 1 CT  36 (SUTURE) ×4
SUT PDS AB 1 CT 36 (SUTURE) ×4 IMPLANT
SUT SILK 2 0 (SUTURE) ×2
SUT SILK 2-0 18XBRD TIE 12 (SUTURE) ×2 IMPLANT
SUT VIC AB 1 CTX 27 (SUTURE) ×8 IMPLANT
TOWEL OR 17X24 6PK STRL BLUE (TOWEL DISPOSABLE) ×8 IMPLANT
TOWEL OR 17X26 10 PK STRL BLUE (TOWEL DISPOSABLE) ×4 IMPLANT
TUBE CONNECTING 12'X1/4 (SUCTIONS) ×2
TUBE CONNECTING 12X1/4 (SUCTIONS) ×6 IMPLANT
TUBE EAR SHEEHY BUTTON 1.27 (OTOLOGIC RELATED) IMPLANT
TUBE EAR T MOD 1.32X4.8 BL (OTOLOGIC RELATED) ×3 IMPLANT
TUBE EAR VENT PAPARELLA 1.02MM (OTOLOGIC RELATED) IMPLANT
TUBE T ENT MOD 1.32X4.8 BL (OTOLOGIC RELATED) ×1
WATER STERILE IRR 1000ML POUR (IV SOLUTION) ×4 IMPLANT
YANKAUER SUCT BULB TIP NO VENT (SUCTIONS) ×4 IMPLANT

## 2017-04-17 NOTE — Transfer of Care (Signed)
Immediate Anesthesia Transfer of Care Note  Patient: Kimberly ManilaSusan K [name]  Procedure(s) Performed: Procedure(s): LEFT ABOVE KNEE AMPUTATION (Left) MYRINGOTOMY WITH TUBE PLACEMENT (Bilateral) APPLICATION OF WOUND VAC (Left)  Patient Location: PACU  Anesthesia Type:General  Level of Consciousness: awake  Airway & Oxygen Therapy: Patient Spontanous Breathing and Patient connected to face mask oxygen  Post-op Assessment: Report given to RN and Post -op Vital signs reviewed and stable  Post vital signs: Reviewed and stable  Last Vitals:  Vitals:   04/17/17 0805 04/17/17 1211  BP: (!) 127/95   Pulse: 95   Resp: 16   Temp: 36.8 C 36.6 C    Last Pain:  Vitals:   04/17/17 1211  PainSc: Asleep      Patients Stated Pain Goal: 3 (04/17/17 0849)  Complications: No apparent anesthesia complications

## 2017-04-17 NOTE — Anesthesia Procedure Notes (Signed)
Procedure Name: Intubation Date/Time: 04/17/2017 10:49 AM Performed by: Heather RobertsSINGER, JAMES Pre-anesthesia Checklist: Patient identified, Emergency Drugs available, Suction available and Patient being monitored Patient Re-evaluated:Patient Re-evaluated prior to induction Oxygen Delivery Method: Circle System Utilized Preoxygenation: Pre-oxygenation with 100% oxygen Induction Type: IV induction Ventilation: Mask ventilation without difficulty and Oral airway inserted - appropriate to patient size Laryngoscope Size: Hyacinth MeekerMiller and 2 Grade View: Grade I Tube type: Oral Tube size: 7.0 mm Number of attempts: 2 Airway Equipment and Method: Stylet and Oral airway Placement Confirmation: ETT inserted through vocal cords under direct vision,  positive ETCO2 and breath sounds checked- equal and bilateral Secured at: 21 cm Tube secured with: Tape Dental Injury: Teeth and Oropharynx as per pre-operative assessment

## 2017-04-17 NOTE — Op Note (Signed)
[name]:  Kimberly [name], Kimberly [name]                 ACCOUNT NO.:  [id number]  MEDICAL RECORD NO.:  [id number]  LOCATION:                               FACILITY:  [facility]  PHYSICIAN:  Kristine GarbeChristopher E. Ezzard StandingNewman, M.D.DATE OF BIRTH:  Feb 25, 1956  DATE OF PROCEDURE: DATE OF DISCHARGE:                              OPERATIVE REPORT   PREOPERATIVE DIAGNOSES: 1. Chronic serous otitis media with conductive hearing loss. 2. Left above knee amputation dehiscence.  POSTOPERATIVE DIAGNOSES: 1. Chronic serous otitis media with conductive hearing loss. 2. Left above knee amputation dehiscence.  OPERATION PERFORMED:  Bilateral myringotomy and tubes with placement of right T-tube and left type 1 Paparella tube.  Left above knee amputation.  SURGEON:  Kristine GarbeChristopher E. Ezzard StandingNewman, M.D.  Mammie LorenzoO-SURGEONNadara Mustard:  Marcus V. Duda, M.D.  ANESTHESIA:  General endotracheal.  COMPLICATIONS:  None.  BRIEF CLINICAL NOTE:  Kimberly LaoSusan [name] is a 61 year old female, who has had persistent otitis media with effusion with conductive hearing loss for a couple months now.  She is scheduled to have above the knee amputation on the left side under general anesthesia and discussed with the concerning placing tubes in her ears at the same time.  DESCRIPTION OF PROCEDURE:  After adequate endotracheal anesthesia, ears were examined using the microscope.  On the right side, she had large amount of white serous otitis.  Myringotomy was made in the anterior superior portion of the TM and a modified T-tube was inserted.  On the left side, she had a small amount of middle ear effusion with little bit of air and again a myringotomy was made in the anterior portion of the TM and Paparella type 1 tube was inserted on the left side, followed by Ciprodex ear drops on the left side as she was having some bleeding. This completed the procedure.  The patient subsequently underwent left above knee amputation with Dr. Lajoyce Cornersuda.  DISPOSITION:  The patient will be admitted to  the hospital.  We will plan on rechecking her in 2-3 weeks for recheck of the myringotomy tubes.    ______________________________ Kristine Garbehristopher E. Ezzard StandingNewman, M.D.   ______________________________ Kristine Garbehristopher E. Ezzard StandingNewman, M.D.    CEN/MEDQ  D:  04/17/2017  T:  04/17/2017  Job:  [id number]

## 2017-04-17 NOTE — H&P (Signed)
Kimberly [name] is an 61 y.o. female.   Chief Complaint: Dehiscence left transtibial amputation HPI: Patient is a 61 year old woman with diabetic insensate neuropathy peripheral vascular disease status post transtibial amputation the left. Patient has had progressive dehiscence of the wounds has failed conservative wound care and presents at this time for an above-the-knee amputation.  Past Medical History:  Diagnosis Date  . Anemia   . Anxiety   . Arthritis    "knees, feet, hands" (08/23/2016)  . Asthma   . CAD, NATIVE VESSEL    May 10, 2010 cath showed a hyperdynamic LV function, she had dominant circumflex anatomy with a 70-80% small OM1. She had diffuse diabetic plaque particularly in the distal LAD. She nondominant RCA.  Nondominant  . Cellulitis 10/15/2013  . CHF (congestive heart failure) (HCC)    Preserved EF  . Chronic bronchitis (HCC)    "probably once/ yr" (08/23/2016)  . Complication of anesthesia    "I've had difficulty waking up" (08/23/2016)  . COPD   . Depression   . Diabetic neuropathy (HCC)   . Dyspnea   . Endometrial hyperplasia   . ESRD (end stage renal disease) on dialysis (HCC)    "[facility]; MWF; [facility]" (08/23/2016)  . Family history of adverse reaction to anesthesia    mother had hard time waking up  . GERD (gastroesophageal reflux disease)   . History of hiatal hernia   . HYPERLIPIDEMIA   . HYPERTENSION   . Hypothyroidism   . Myocardial infarct, old   . On home oxygen therapy    "2L; 24/7" (08/23/2016)  . Paralyzed vocal cords   . Peripheral neuropathy    hx/notes 01/27/2010  . Pneumonia "several times"  . Proliferative retinopathy    hx/notes 01/27/2010  . PVD    CEA  . Restless leg syndrome    "mostly on the right" (08/23/2016)  . 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 Type II diabetes mellitus (HCC)     Past Surgical  History:  Procedure Laterality Date  . AMPUTATION Left 02/16/2017   Procedure: Left Below Knee Amputation;  Surgeon: Kimberly Mustard, MD;  [facility] OR;  Service: Orthopedics;  Laterality: Left;  . AV FISTULA PLACEMENT Right 07/08/2015   Procedure: EXPLORATION RIGHT AXILLARY ARTERY AND RIGHT BRACHIAL VEIN;  Surgeon: Chuck Hint, MD;  Location: [facility] OR;  Service: Vascular;  Laterality: Right;  . AV FISTULA PLACEMENT Right 01/02/2017   Procedure: INSERTION OF ARTERIOVENOUS GORE-TEX  GRAFT  RIGHT ARM;  Surgeon: Chuck Hint, MD;  Location: [facility] OR;  Service: Vascular;  Laterality: Right;  . CAROTID ENDARTERECTOMY Left X 2  . CATARACT EXTRACTION W/ INTRAOCULAR LENS  IMPLANT, BILATERAL Bilateral   . CERVICAL BIOPSY  ~ 2015   "precancerous cells"  . COLONOSCOPY    . EYE SURGERY Bilateral    numerous surgeries  . INSERTION OF DIALYSIS CATHETER N/A 06/24/2015   Procedure: ULTRASOUND BILATERAL INTERNAL JUGULAR VEIN INSERTION OF DIALYSIS CATHETER LEFT INTERNAL JUGULAR VEIN ;  Surgeon: Pryor Ochoa, MD;  Location: [facility] OR;  Service: Vascular;  Laterality: N/A;  . INTRAUTERINE DEVICE INSERTION  ~ 2015  . IR REMOVAL TUN CV CATH W/O FL  02/15/2017  . THROMBECTOMY AND REVISION OF ARTERIOVENTOUS (AV) GORETEX  GRAFT Right 03/17/2017   Procedure: THROMBECTOMY AND REVISION OF ARM  ARTERIOVENTOUS GORETEX  GRAFT USING A  7MM BY 10CM GORTEX GRAFT ;  Surgeon: Sherren KernsFields, Charles E, MD;  [facility];  Service: Vascular;  Laterality: Right;  . VITRECTOMY Bilateral     Family History  Problem Relation Age of Onset  . Cancer Mother        Breast, NHL  . Stroke Mother   . Peripheral vascular disease Father   . CAD Father 5130  . Heart attack Father   . Hypertension Father   . Asthma Father   . Heart disease Father        before age 62   Social History:  reports that she quit smoking about 11 years ago. Her smoking use included Cigarettes. She has a 20.00 pack-year smoking history. She has never used  smokeless tobacco. She reports that she does not drink alcohol or use drugs.  Allergies:  Allergies  Allergen Reactions  . Ciprofloxacin Itching  . Epinephrine Palpitations    No prescriptions prior to admission.    No results found for this or any previous visit (from the past 48 hour(s)). No results found.  Review of Systems  All other systems reviewed and are negative.   There were no vitals taken for this visit. Physical Exam  Examination there is large dehiscence of the surgical incision left 11 knee amputation. The wound has dehisced both medially and laterally. There is insufficient soft tissue to consider amputation. Assessment/Plan Assessment: Dehiscence left transtibial amputation with diabetic insensate neuropathy peripheral vascular disease insufficient soft tissue to consider revision of the transtibial amputation  Plan: We'll plan for an above-the-knee amputation. Risk and benefits were discussed including risk of the wound not healing. Patient states she understands wish to proceed this time. Plan for discharge back to skilled nursing in several days.  Kimberly MustardMarcus V Estevon Fluke, MD 04/17/2017, 6:33 AM

## 2017-04-17 NOTE — Interval H&P Note (Signed)
History and Physical Interval Note:  04/17/2017 10:31 AM  Kimberly [name]  has presented today for surgery, with the diagnosis of left below knee amputation dehiscence  The various methods of treatment have been discussed with the patient and family. After consideration of risks, benefits and other options for treatment, the patient has consented to  Procedure(s): LEFT ABOVE KNEE AMPUTATION (Left) MYRINGOTOMY WITH TUBE PLACEMENT (Bilateral) as a surgical intervention .  The patient's history has been reviewed, patient examined, no change in status, stable for surgery.  I have reviewed the patient's chart and labs.  Questions were answered to the patient's satisfaction.     Aryonna Gunnerson

## 2017-04-17 NOTE — Op Note (Signed)
04/17/2017  11:46 AM  PATIENT:  Kimberly [name]    PRE-OPERATIVE DIAGNOSIS:  left below knee amputation dehiscence  POST-OPERATIVE DIAGNOSIS:  Same  PROCEDURE:  LEFT ABOVE KNEE AMPUTATION  SURGEON:  Nadara MustardMarcus V Duda, MD  PHYSICIAN ASSISTANT:None ANESTHESIA:   General  PREOPERATIVE INDICATIONS:  Kimberly [name] is a  61 y.o. female with a diagnosis of left below knee amputation dehiscence who failed conservative measures and elected for surgical management.    The risks benefits and alternatives were discussed with the patient preoperatively including but not limited to the risks of infection, bleeding, nerve injury, cardiopulmonary complications, the need for revision surgery, among others, and the patient was willing to proceed.  OPERATIVE IMPLANTS: Prevena VAC  OPERATIVE FINDINGS: minimal petechial bleeding  OPERATIVE PROCEDURE: Patient brought the operating room initially underwent placement of ear tubes with Dr. Ezzard StandingNewman. After his procedures completed the patient's right lower extremity was prepped using DuraPrep draped into a sterile field a timeout was called. A fishmouth incision was made through the distal thigh. Electrocautery was used for hemostasis the vascular bundles were clamped medially and ligated with 2-0 silk. A precipitating saw was used to amputate through the distal femur. Amputation was completed hemostasis was obtained. All nonviable's fat was excised. The deep and superficial fascial layers and skin was closed using 2-0 nylon. A Prevena wound VAC was applied this had a good suction fit patient was extubated taken to PACU in stable condition.

## 2017-04-17 NOTE — Anesthesia Preprocedure Evaluation (Addendum)
Anesthesia Evaluation  Patient identified by MRN, date of birth, ID band Patient awake    Reviewed: Allergy & Precautions, H&P , NPO status , Patient's Chart, lab work & pertinent test results  History of Anesthesia Complications Negative for: history of anesthetic complications  Airway Mallampati: III  TM Distance: >3 FB Neck ROM: Full    Dental no notable dental hx. (+) Teeth Intact, Dental Advisory Given   Pulmonary asthma , COPD,  COPD inhaler, former smoker,    Pulmonary exam normal breath sounds clear to auscultation       Cardiovascular hypertension, Pt. on medications + CAD, + Cardiac Stents, + Peripheral Vascular Disease and +CHF  + dysrhythmias  Rhythm:Regular Rate:Normal     Neuro/Psych PSYCHIATRIC DISORDERS Anxiety Depression CVA    GI/Hepatic Neg liver ROS, GERD  ,  Endo/Other  diabetes, Insulin DependentHypothyroidism Morbid obesity  Renal/GU ESRF and DialysisRenal disease  negative genitourinary   Musculoskeletal  (+) Arthritis , Osteoarthritis,    Abdominal (+) + obese,   Peds  Hematology negative hematology ROS (+) anemia ,   Anesthesia Other Findings   Reproductive/Obstetrics negative OB ROS                             Anesthesia Physical  Anesthesia Plan  ASA: III  Anesthesia Plan: General   Post-op Pain Management:    Induction: Intravenous  PONV Risk Score and Plan: Ondansetron, Dexamethasone, Treatment may vary due to age or medical condition and Diphenhydramine  Airway Management Planned: LMA  Additional Equipment:   Intra-op Plan:   Post-operative Plan: Extubation in OR  Informed Consent: I have reviewed the patients History and Physical, chart, labs and discussed the procedure including the risks, benefits and alternatives for the proposed anesthesia with the patient or authorized representative who has indicated his/her understanding and  acceptance.   Dental advisory given  Plan Discussed with: CRNA, Anesthesiologist and Surgeon  Anesthesia Plan Comments:        Anesthesia Quick Evaluation

## 2017-04-17 NOTE — Anesthesia Postprocedure Evaluation (Signed)
Anesthesia Post Note  Patient: Kimberly [name]  Procedure(s) Performed: Procedure(s) (LRB): LEFT ABOVE KNEE AMPUTATION (Left) MYRINGOTOMY WITH TUBE PLACEMENT (Bilateral) APPLICATION OF WOUND VAC (Left)     Patient location during evaluation: PACU Anesthesia Type: General Level of consciousness: sedated Pain management: pain level controlled Vital Signs Assessment: post-procedure vital signs reviewed and stable Respiratory status: spontaneous breathing and respiratory function stable Cardiovascular status: stable Anesthetic complications: no    Last Vitals:  Vitals:   04/17/17 1309 04/17/17 1310  BP: (!) 117/37   Pulse: (!) 103   Resp: (!) 22   Temp:  36.6 C    Last Pain:  Vitals:   04/17/17 1308  PainSc: 7                  Onetta Spainhower DANIEL

## 2017-04-17 NOTE — Brief Op Note (Signed)
04/17/2017  12:28 PM  PATIENT:  Kimberly [name]  61 y.o. female  PRE-OPERATIVE DIAGNOSIS:  left below knee amputation dehiscence                                                       Bilateral serous otitis media with CHL POST-OPERATIVE DIAGNOSIS:  left below knee amputation dehiscence                                                        Bilateral serous otitis media with CHL PROCEDURE:  Procedure(s): LEFT ABOVE KNEE AMPUTATION (Left) MYRINGOTOMY WITH TUBE PLACEMENT (Bilateral) APPLICATION OF WOUND VAC (Left)  SURGEON:  Surgeon(s) and Role: Panel 1:    * Nadara Mustarduda, Marcus V, MD - Primary  Panel 2:    Drema Halon* Newman, Christopher E, MD - Primary  PHYSICIAN ASSISTANT:   ASSISTANTS: none   ANESTHESIA:   general  EBL:  Total I/O In: 750 [I.V.:500; IV Piggyback:250] Out: 150 [Blood:150]  BLOOD ADMINISTERED:none  DRAINS: none   LOCAL MEDICATIONS USED:  NONE  SPECIMEN:  No Specimen  DISPOSITION OF SPECIMEN:  N/A  COUNTS:  YES  TOURNIQUET:  * No tourniquets in log *  DICTATION: .Other Dictation: Dictation Number [id number]  PLAN OF CARE: Admit to inpatient   PATIENT DISPOSITION:  PACU - hemodynamically stable.   Delay start of Pharmacological VTE agent (>24hrs) due to surgical blood loss or risk of bleeding: not applicable

## 2017-04-18 ENCOUNTER — Encounter (HOSPITAL_COMMUNITY): Payer: Self-pay | Admitting: Orthopedic Surgery

## 2017-04-18 ENCOUNTER — Other Ambulatory Visit (INDEPENDENT_AMBULATORY_CARE_PROVIDER_SITE_OTHER): Payer: Self-pay | Admitting: Family

## 2017-04-18 LAB — RENAL FUNCTION PANEL
ALBUMIN: 2.4 g/dL — AB (ref 3.5–5.0)
Anion gap: 16 — ABNORMAL HIGH (ref 5–15)
BUN: 30 mg/dL — AB (ref 6–20)
CHLORIDE: 95 mmol/L — AB (ref 101–111)
CO2: 22 mmol/L (ref 22–32)
Calcium: 8.9 mg/dL (ref 8.9–10.3)
Creatinine, Ser: 5.4 mg/dL — ABNORMAL HIGH (ref 0.44–1.00)
GFR, EST AFRICAN AMERICAN: 9 mL/min — AB (ref >60)
GFR, EST NON AFRICAN AMERICAN: 8 mL/min — AB (ref >60)
Glucose, Bld: 203 mg/dL — ABNORMAL HIGH (ref 65–99)
PHOSPHORUS: 5.4 mg/dL — AB (ref 2.5–4.6)
POTASSIUM: 5.2 mmol/L — AB (ref 3.5–5.1)
Sodium: 133 mmol/L — ABNORMAL LOW (ref 135–145)

## 2017-04-18 LAB — GLUCOSE, CAPILLARY
GLUCOSE-CAPILLARY: 364 mg/dL — AB (ref 65–99)
Glucose-Capillary: 189 mg/dL — ABNORMAL HIGH (ref 65–99)
Glucose-Capillary: 204 mg/dL — ABNORMAL HIGH (ref 65–99)
Glucose-Capillary: 87 mg/dL (ref 65–99)

## 2017-04-18 LAB — CBC
HEMATOCRIT: 27.1 % — AB (ref 36.0–46.0)
Hemoglobin: 8.4 g/dL — ABNORMAL LOW (ref 12.0–15.0)
MCH: 30.4 pg (ref 26.0–34.0)
MCHC: 31 g/dL (ref 30.0–36.0)
MCV: 98.2 fL (ref 78.0–100.0)
PLATELETS: 248 10*3/uL (ref 150–400)
RBC: 2.76 MIL/uL — AB (ref 3.87–5.11)
RDW: 16.9 % — AB (ref 11.5–15.5)
WBC: 11.1 10*3/uL — AB (ref 4.0–10.5)

## 2017-04-18 LAB — HEMOGLOBIN A1C
Hgb A1c MFr Bld: 6.6 % — ABNORMAL HIGH (ref 4.8–5.6)
Mean Plasma Glucose: 143 mg/dL

## 2017-04-18 LAB — MRSA PCR SCREENING: MRSA by PCR: NEGATIVE

## 2017-04-18 MED ORDER — SEVELAMER CARBONATE 2.4 G PO PACK
2.4000 g | PACK | Freq: Three times a day (TID) | ORAL | Status: DC
  Administered 2017-04-19 (×3): 2.4 g via ORAL
  Filled 2017-04-18 (×11): qty 1

## 2017-04-18 MED ORDER — SODIUM CHLORIDE 0.9 % IV SOLN: 100.0000 mL | INTRAVENOUS | Status: DC | PRN

## 2017-04-18 MED ORDER — LIDOCAINE-PRILOCAINE 2.5-2.5 % EX CREA
1.0000 "application " | TOPICAL_CREAM | CUTANEOUS | Status: DC | PRN
  Filled 2017-04-18: qty 5

## 2017-04-18 MED ORDER — DOXERCALCIFEROL 4 MCG/2ML IV SOLN
1.0000 ug | INTRAVENOUS | Status: DC
  Administered 2017-04-18 – 2017-04-20 (×2): 1 ug via INTRAVENOUS
  Filled 2017-04-18 (×2): qty 2

## 2017-04-18 MED ORDER — DOXERCALCIFEROL 4 MCG/2ML IV SOLN
INTRAVENOUS | Status: AC
  Filled 2017-04-18: qty 2

## 2017-04-18 MED ORDER — LIDOCAINE HCL (PF) 1 % IJ SOLN: 5.0000 mL | INTRAMUSCULAR | Status: DC | PRN

## 2017-04-18 MED ORDER — OXYCODONE HCL 5 MG PO TABS
5.0000 mg | ORAL_TABLET | ORAL | Status: DC | PRN
  Administered 2017-04-18 – 2017-04-21 (×6): 10 mg via ORAL
  Filled 2017-04-18: qty 1
  Filled 2017-04-18 (×2): qty 2
  Filled 2017-04-18: qty 1
  Filled 2017-04-18 (×3): qty 2

## 2017-04-18 MED ORDER — PENTAFLUOROPROP-TETRAFLUOROETH EX AERO: 1.0000 "application " | INHALATION_SPRAY | CUTANEOUS | Status: DC | PRN

## 2017-04-18 NOTE — Progress Notes (Signed)
RN changed diet to dysphagia 1 per MD verbal order.

## 2017-04-18 NOTE — NC FL2 (Signed)
[place] MEDICAID FL2 LEVEL OF CARE SCREENING TOOL     IDENTIFICATION  Patient Name: Kimberly [name] Birthdate: August 30, 1956 Sex: female Admission Date (Current Location): 04/17/2017  [facility] and [place] Number:  Producer, television/film/video and Address:  [facility], [address], [place], [place] [address]      Provider Number: [id number]  Attending Physician Name and Address:  Nadara Mustard, MD  Relative Name and Phone Number:       Current Level of Care: SNF Recommended Level of Care: Skilled Nursing Facility Prior Approval Number:    Date Approved/Denied: 04/18/17 PASRR Number: [id number]  Discharge Plan: SNF    Current Diagnoses: Patient Active Problem List   Diagnosis Date Noted  . Above knee amputation of left lower extremity (HCC) 04/17/2017  . Dehiscence of amputation stump (HCC)   . Pneumonia 03/14/2017  . Hx of BKA, left (HCC) 02/27/2017  . Gangrene of left foot (HCC)   . Congestive heart failure (CHF) (HCC)   . Diabetes mellitus with complication (HCC)   . Sepsis (HCC) 02/14/2017  . Diabetes mellitus, type II, insulin dependent (HCC)   . HCAP (healthcare-associated pneumonia) 09/11/2016  . Pressure injury of skin 08/29/2016  . Chest pain 08/28/2016  . Elevated troponin 08/28/2016  . 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 Hyperkalemia 08/23/2016  . Shortness of breath 08/23/2016  . Hyponatremia 08/23/2016  . Hypotension arterial 09/30/2015  . LBBB (left bundle branch block) 09/30/2015  . Complex endometrial hyperplasia with atypia 09/02/2015  . Severe obesity (HCC) 07/29/2015  . Hypotension 07/20/2015  . Uncontrolled type 2 diabetes mellitus (HCC) 07/20/2015  . Vaginal bleeding   . Altered mental status   . Pulmonary arterial hypertension (HCC)   . Paroxysmal atrial fibrillation (HCC) 07/11/2015  . ESRD (end stage renal disease) (HCC) 07/08/2015  . Pressure ulcer 04/07/2015  . Obesity hypoventilation syndrome (HCC) 08/05/2014  . Morbid  obesity (HCC) 06/28/2014  . Physical deconditioning 06/28/2014  . Hypothyroidism 06/10/2014  . Carotid stenosis 12/02/2013  . Varicose veins of lower extremities with other complications 12/02/2013  . Chronic respiratory failure with hypoxia (HCC) 10/13/2013  . On home oxygen therapy 10/13/2013  . Chronic diastolic CHF (congestive heart failure) (HCC) 09/12/2013  . CAD (coronary artery disease), native coronary artery   . DM (diabetes mellitus), type 2 with renal complications (HCC) 05/31/2010  . Hyperlipidemia associated with type 2 diabetes mellitus (HCC) 05/31/2010  . COPD (chronic obstructive pulmonary disease) (HCC) 05/31/2010  . Hypertensive heart disease     Orientation RESPIRATION BLADDER Height & Weight     Self, Situation, Place  O2 (Nasal Cannula 2L) Continent Weight: 229 lb (103.9 kg) Height:  5\' 4"  (162.6 cm)  BEHAVIORAL SYMPTOMS/MOOD NEUROLOGICAL BOWEL NUTRITION STATUS      Continent Diet (See DC Summary)  AMBULATORY STATUS COMMUNICATION OF NEEDS Skin   Extensive Assist Verbally Surgical wounds (Closed Thigh Incision, with Transparent dressing)                       Personal Care Assistance Level of Assistance              Functional Limitations Info  Hearing   Hearing Info: Impaired      SPECIAL CARE FACTORS FREQUENCY  PT (By licensed PT)     PT Frequency: 2x week              Contractures      Additional Factors Info  Code Status,  Allergies, Insulin Sliding Scale, Isolation Precautions Code Status Info: DNR Allergies Info: CIPROFLOXACIN, EPINEPHRINE    Insulin Sliding Scale Info: Insulin Daily Isolation Precautions Info: MRSA     Current Medications (04/18/2017):  This is the current hospital active medication list Current Facility-Administered Medications  Medication Dose Route Frequency Provider Last Rate Last Dose  . 0.9 %  sodium chloride infusion   Intravenous Continuous Nadara Mustard, MD      . acetaminophen (TYLENOL) tablet  650 mg  650 mg Oral Q6H PRN Nadara Mustard, MD       Or  . acetaminophen (TYLENOL) suppository 650 mg  650 mg Rectal Q6H PRN Nadara Mustard, MD      . albuterol (PROVENTIL) (2.5 MG/3ML) 0.083% nebulizer solution 2.5 mg  2.5 mg Inhalation Q6H PRN Nadara Mustard, MD      . ALPRAZolam Prudy Feeler) tablet 0.5 mg  0.5 mg Oral Q8H PRN Nadara Mustard, MD      . aspirin EC tablet 81 mg  81 mg Oral Daily Nadara Mustard, MD   81 mg at 04/18/17 4696  . bisacodyl (DULCOLAX) suppository 10 mg  10 mg Rectal Daily PRN Nadara Mustard, MD      . budesonide (PULMICORT) nebulizer solution 0.25 mg  0.25 mg Nebulization BID Nadara Mustard, MD   0.25 mg at 04/18/17 0905  . docusate sodium (COLACE) capsule 100 mg  100 mg Oral BID Nadara Mustard, MD   100 mg at 04/17/17 2232  . doxercalciferol (HECTOROL) injection 1 mcg  1 mcg Intravenous Q M,W,F-HD Julien Nordmann, PA-C      . fluticasone (FLONASE) 50 MCG/ACT nasal spray 2 spray  2 spray Each Nare QHS Nadara Mustard, MD   2 spray at 04/17/17 2327  . HYDROmorphone (DILAUDID) injection 1 mg  1 mg Intravenous Q2H PRN Nadara Mustard, MD      . insulin aspart (novoLOG) injection 0-15 Units  0-15 Units Subcutaneous TID WC Nadara Mustard, MD   5 Units at 04/18/17 1236  . insulin aspart (novoLOG) injection 4 Units  4 Units Subcutaneous TID WC Nadara Mustard, MD   4 Units at 04/18/17 1237  . insulin glargine (LANTUS) injection 20 Units  20 Units Subcutaneous QHS Nadara Mustard, MD   20 Units at 04/17/17 2234  . levothyroxine (SYNTHROID, LEVOTHROID) tablet 88 mcg  88 mcg Oral QAC breakfast Nadara Mustard, MD   88 mcg at 04/18/17 2952  . magnesium citrate solution 1 Bottle  1 Bottle Oral Once PRN Nadara Mustard, MD      . methocarbamol (ROBAXIN) tablet 500 mg  500 mg Oral Q6H PRN Nadara Mustard, MD   500 mg at 04/18/17 0549  . metoCLOPramide (REGLAN) tablet 5-10 mg  5-10 mg Oral Q8H PRN Nadara Mustard, MD       Or  . metoCLOPramide (REGLAN) injection 5-10 mg  5-10 mg Intravenous  Q8H PRN Nadara Mustard, MD      . midodrine (PROAMATINE) tablet 10 mg  10 mg Oral Daily Nadara Mustard, MD   10 mg at 04/18/17 8413  . ondansetron (ZOFRAN) tablet 4 mg  4 mg Oral Q6H PRN Nadara Mustard, MD       Or  . ondansetron [facility]) injection 4 mg  4 mg Intravenous Q6H PRN Nadara Mustard, MD      . oxyCODONE (Oxy IR/ROXICODONE) immediate release tablet 5-10 mg  5-10 mg  Oral Q3H PRN Nadara Mustarduda, Marcus V, MD   10 mg at 04/18/17 1129  . pantoprazole (PROTONIX) EC tablet 40 mg  40 mg Oral BID AC Nadara Mustarduda, Marcus V, MD   40 mg at 04/18/17 [id number]  . polyethylene glycol (MIRALAX / GLYCOLAX) packet 17 g  17 g Oral Daily PRN Nadara Mustarduda, Marcus V, MD      . sevelamer carbonate (RENVELA) powder PACK 2.4 g  2.4 g Oral TID WC Julien NordmannStovall, Kathryn R, PA-C         Discharge Medications: Please see discharge summary for a list of discharge medications.  Relevant Imaging Results:  Relevant Lab Results:   Additional Information SS#:[id number]  Dialysis [facility]  Lisaann Atha V Gedalia Mcmillon, [name]

## 2017-04-18 NOTE — Progress Notes (Signed)
Patient ID: Galen ManilaSusan K Lees, female   DOB: 1956-04-11, 61 y.o.   MRN: [id number] Patient states she has more pain from the above knee amputation than she did the below the knee amputation. She states that her hearing is better. The wound VAC has minimal drainage. Plan for discharge back to skilled nursing facility.

## 2017-04-18 NOTE — Clinical Social Work Note (Signed)
Clinical Social Work Assessment  Patient Details  Name: Kimberly [name] MRN: [id number] Date of Birth: 1956/05/17  Date of referral:  04/18/17               Reason for consult:  Facility Placement                Permission sought to share information with:  Facility Sport and exercise psychologist Permission granted to share information::  Yes, Verbal Permission Granted  Name::     spouse, Mr. Debara Kamphuis  Agency::  SNF  Relationship::     Contact Information:     Housing/Transportation Living arrangements for the past 2 months:  [place] of Information:  Patient Patient Interpreter Needed:  None Criminal Activity/Legal Involvement Pertinent to Current Situation/Hospitalization:  No - Comment as needed Significant Relationships:  Spouse Lives with:  Spouse Do you feel safe going back to the place where you live?  No Need for family participation in patient care:  No (Coment)  Care giving concerns:  Pt came from [facility] for a prior injury. Pt would like to return to [facility] as her spouse cannot care for at home with this new physical impairment.   Social Worker assessment / plan:  CSW met with patient at bedside to discuss her need for further rehab and her return to [facility].  Patient in agreement to return to SNF. CSW has permission to send offers to [facility] as they will take patient back for SNF.  FL2 pending. Passr confirmed. Offers to be sent.  Employment status:  Disabled (Comment on whether or not currently receiving Disability), Unemployed Insurance information:  Medicare PT Recommendations:  [place] / Referral to community resources:  [place]  Patient/Family's Response to care:  Contractor of CSW assistance. No issues and concerns identified.  Patient/Family's Understanding of and Emotional Response to Diagnosis, Current Treatment, and Prognosis:  Patient/family has good understanding of  diagnosis, current treatment and prognosis.  Patient hopeful that her impairment will improve with SNF. No issues and concerns.  Emotional Assessment Appearance:  Appears stated age Attitude/Demeanor/Rapport:   (Cooperative) Affect (typically observed):  Accepting, Appropriate Orientation:  Oriented to Self, Oriented to Place, Oriented to Situation Alcohol / Substance use:  Not Applicable Psych involvement (Current and /or in the community):  No (Comment)  Discharge Needs  Concerns to be addressed:  Care Coordination Readmission within the last 30 days:  No Current discharge risk:  Dependent with Mobility, Physical Impairment Barriers to Discharge:  No Barriers Identified   Normajean Baxter, [name] 04/18/2017, 12:13 PM

## 2017-04-18 NOTE — Evaluation (Signed)
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  Also with recent diagnosis of pneumonia and R foot ulcers.  She has PMH of ESRD on HD MWF, GERD, CAD, CVA w/ R hemiparesis and vocal cord paralysis, HTN. PVD and peripheral neuropathy.  Clinical Impression  Patient presents with decreased mobility due to pain, limited tolerance and decreased ROM/strength.  Feel she will benefit from skilled PT In the acute setting to allow return to rehab upon d/c.  Feel she can tolerate more with premedication so plan for next treatment.  Feel she is safe for OOB transfer with nursing staff with lift equipment only.    Follow Up Recommendations SNF (From [facility])    Equipment Recommendations  None recommended by PT    Recommendations for Other Services       Precautions / Restrictions Precautions Precautions: Fall Precaution Comments: R UE new graft Restrictions Weight Bearing Restrictions: Yes LLE Weight Bearing: Non weight bearing      Mobility  Bed Mobility Overal bed mobility: Needs Assistance             General bed mobility comments: scooted up to [facility] with +2 total assist, pt leans to R in bed so positioned with pillow under R hip, deferred EOB per pt request due to pain  Transfers                    Ambulation/Gait                Stairs            Wheelchair Mobility    Modified Rankin (Stroke Patients Only)       Balance                                             Pertinent Vitals/Pain Pain Assessment: Faces Faces Pain Scale: Hurts whole lot Pain Location: L LE with movement in bed Pain Descriptors / Indicators: Operative site guarding;Grimacing;Tender Pain Intervention(s): Limited activity within  patient's tolerance;Monitored during session;Repositioned    Home Living Family/patient expects to be discharged to:: Skilled nursing facility [facility])                 Additional Comments: 2L O2 at home    Prior Function Level of Independence: Needs assistance   Gait / Transfers Assistance Needed: was using hoyer for transfers at [facility]           Hand Dominance        Extremity/Trunk Assessment   Upper Extremity Assessment Upper Extremity Assessment: RUE deficits/detail RUE Deficits / Details: reports numbness in hand, able to grip but very clumsy and weak    Lower Extremity Assessment Lower Extremity Assessment: LLE deficits/detail;RLE deficits/detail RLE Deficits / Details: ankle AROM limited some per pt, has kerlix wrap on foot and reports heel ulcer; noted skin changes on lower leg with chronic edema and hemosiderin staining, reports numbness up to knee, strength hip flexion unable to lift unaided, knee extension 3+/5 LLE Deficits / Details: wound vac in place on transfemoral amputation, can lift unaided, but too painful to maintain       Communication  Communication: HOH  Cognition Arousal/Alertness: Awake/alert Behavior During Therapy: WFL for tasks assessed/performed Overall Cognitive Status: Within Functional Limits for tasks assessed                                        General Comments      Exercises     Assessment/Plan    PT Assessment Patient needs continued PT services  PT Problem List Decreased strength;Decreased mobility;Decreased range of motion;Decreased activity tolerance;Decreased balance;Decreased knowledge of use of DME;Pain;Impaired sensation       PT Treatment Interventions DME instruction;Therapeutic exercise;Patient/family education;Therapeutic activities;Functional mobility training;Balance training;Wheelchair mobility training    PT Goals (Current goals can be found in the Care Plan section)  Acute Rehab  PT Goals Patient Stated Goal: To return to rehab PT Goal Formulation: With patient Time For Goal Achievement: 04/11/2017 Potential to Achieve Goals: Fair    Frequency Min 2X/week   Barriers to discharge        Co-evaluation               AM-PAC PT "6 Clicks" Daily Activity  Outcome Measure Difficulty turning over in bed (including adjusting bedclothes, sheets and blankets)?: Total Difficulty moving from lying on back to sitting on the side of the bed? : Total Difficulty sitting down on and standing up from a chair with arms (e.g., wheelchair, bedside commode, etc,.)?: Total Help needed moving to and from a bed to chair (including a wheelchair)?: Total Help needed walking in hospital room?: Total Help needed climbing 3-5 steps with a railing? : Total 6 Click Score: 6    End of Session   Activity Tolerance: Patient limited by pain Patient left: in bed;with call bell/phone within reach Nurse Communication: Need for lift equipment;Other (comment) (patient with oxisensor malfunction, need for diet change) PT Visit Diagnosis: Other abnormalities of gait and mobility (R26.89);Pain Pain - Right/Left: Left Pain - part of body: Leg    Time: [id number] PT Time Calculation (min) (ACUTE ONLY): 25 min   Charges:   PT Evaluation $PT Eval Moderate Complexity: 1 Mod PT Treatments $Therapeutic Activity: 8-22 mins   PT G CodesSheran [name]:        Kimberly [name], [place] CarolinaPT [phone] 04/18/2017   Kimberly [name] 04/18/2017, 10:58 AM

## 2017-04-18 NOTE — Progress Notes (Signed)
Patient CBG is 443. MD notified. Lantus to be given as scheduled. [facility]

## 2017-04-18 NOTE — Progress Notes (Signed)
Inpatient Diabetes Program Recommendations  AACE/ADA: New Consensus Statement on Inpatient Glycemic Control (2015)  Target Ranges:  Prepandial:   less than 140 mg/dL      Peak postprandial:   less than 180 mg/dL (1-2 hours)      Critically ill patients:  140 - 180 mg/dL   Lab Results  Component Value Date   GLUCAP 364 (H) 04/18/2017   HGBA1C 6.6 (H) 04/17/2017    Review of Glycemic Control Results for Kimberly [name], Kimberly [name] (MRN [id number]) as of 04/18/2017 09:25  Ref. Range 04/17/2017 10:27 04/17/2017 12:10 04/17/2017 17:01 04/17/2017 21:06 04/18/2017 06:44  Glucose-Capillary Latest Ref Range: 65 - 99 mg/dL 829166 (H) 562244 (H) 130291 (H) 443 (H) 364 (H)   Diabetes history: DM2 Outpatient Diabetes medications: Lantus 30 units bid + Novolog correction Current orders for Inpatient glycemic control: Lantus 20 units qs + Novolog 4 units MC tid + Novolog correction 0-15 units tid  Inpatient Diabetes Program Recommendations:  Patient has received a total of 31 units Novolog correction over the past 24 hrs with CBGs ranging from 166-443. Please consider: -Increase Lantus to 30 units qd -Increase Novolog meal coverage to 8 if eats 50% -Add Novolog correction 0-5 units hs  Thank you, Billy FischerJudy E. Ilene Witcher, RN, MSN, CDE  Diabetes Coordinator Inpatient Glycemic Control Team Team Pager [phone] (8am-5pm) 04/18/2017 9:30 AM

## 2017-04-18 NOTE — Progress Notes (Signed)
[facility] [place]  Patient name: Kimberly [name] MRN: [id number] DOB: February 16, 1956 Sex: female   HPI:    Asked to see regarding right axillary wound and possible communication with right arm graft; requesting provider Ozzie Hoyle, PA-C (nephrology). Ms Boileau recently underwent thrombectomy and revision of right upper arm AV graft on 03/17/17 by Dr. Darrick Penna. The graft was revised to a more distal segment of the axillary vein. Her original graft was placed on 01/02/17 by Dr. Edilia Bo. She denies any fever or chills.   She reports the graft has been working fine. She denies any issues or drainage with her axillary wound. She has reported "drainage" from where her graft is stuck approximately a week and a half ago.  According to patient, cultures were obtained at HD from where her graft has been stuck. HD records per nephrology report axillary boil. Cultures grew Providencia stuartii and Acintobacter. Has had 6 doses of ceftazidime.   She is admitted secondary to dehiscence from left transtibial amputation by Dr. Lajoyce Corners. She underwent conversion to left AKA yesterday with Dr. Lajoyce Corners. She has right foot wounds that are currently being followed by the wound center.   She previously suffered an intraop cardiac arrest during attempt at right arm access on 07/08/15. According to Dr. Adele Dan previous notes, she is not candidate for access in her left arm due to diminished radial pulse and history of trauma to the left arm. She is also known to [place] from having undergone previous left carotid endarterectomy and redo carotid endarterectomy by Dr. Hart Rochester in 2002.    PAST MEDICAL HISTORY:   Past Medical History:  Diagnosis Date  . Anemia   . Anxiety   . Arthritis    "knees, feet, hands" (08/23/2016)  . Asthma   . CAD, NATIVE VESSEL    May 10, 2010 cath showed a hyperdynamic LV function, she had dominant circumflex anatomy with a 70-80% small OM1. She had diffuse diabetic plaque  particularly in the distal LAD. She nondominant RCA.  Nondominant  . Cellulitis 10/15/2013  . CHF (congestive heart failure) (HCC)    Preserved EF  . Chronic bronchitis (HCC)    "probably once/ yr" (08/23/2016)  . Complication of anesthesia    "I've had difficulty waking up" (08/23/2016)  . COPD   . Depression   . Diabetic neuropathy (HCC)   . Dyspnea   . Endometrial hyperplasia   . ESRD (end stage renal disease) on dialysis (HCC)    "[facility]; MWF; [facility]" (08/23/2016)  . Family history of adverse reaction to anesthesia    mother had hard time waking up  . GERD (gastroesophageal reflux disease)   . History of hiatal hernia   . HYPERLIPIDEMIA   . HYPERTENSION   . Hypothyroidism   . Myocardial infarct, old   . On home oxygen therapy    "2L; 24/7" (08/23/2016)  . Paralyzed vocal cords   . Peripheral neuropathy    hx/notes 01/27/2010  . Pneumonia "several times"  . Proliferative retinopathy    hx/notes 01/27/2010  . PVD    CEA  . Restless leg syndrome    "mostly on the right" (08/23/2016)  . 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 Type II diabetes mellitus (HCC)     Family History  Problem Relation Age of Onset  . Cancer Mother  Breast, NHL  . Stroke Mother   . Peripheral vascular disease Father   . CAD Father 65  . Heart attack Father   . Hypertension Father   . Asthma Father   . Heart disease Father        before age 22    Social History  Substance Use Topics  . Smoking status: Former Smoker    Packs/day: 1.00    Years: 20.00    Types: Cigarettes    Quit date: 09/11/2005  . Smokeless tobacco: Never Used  . Alcohol use No    Allergies  Allergen Reactions  . Ciprofloxacin Itching  . Epinephrine Palpitations    MEDICATIONS:   Current Facility-Administered Medications  Medication Dose Route Frequency Provider Last Rate Last  Dose  . 0.9 %  sodium chloride infusion   Intravenous Continuous Nadara Mustard, MD      . acetaminophen (TYLENOL) tablet 650 mg  650 mg Oral Q6H PRN Nadara Mustard, MD       Or  . acetaminophen (TYLENOL) suppository 650 mg  650 mg Rectal Q6H PRN Nadara Mustard, MD      . albuterol (PROVENTIL) (2.5 MG/3ML) 0.083% nebulizer solution 2.5 mg  2.5 mg Inhalation Q6H PRN Nadara Mustard, MD      . ALPRAZolam Prudy Feeler) tablet 0.5 mg  0.5 mg Oral Q8H PRN Nadara Mustard, MD      . aspirin EC tablet 81 mg  81 mg Oral Daily Nadara Mustard, MD   81 mg at 04/18/17 1610  . bisacodyl (DULCOLAX) suppository 10 mg  10 mg Rectal Daily PRN Nadara Mustard, MD      . budesonide (PULMICORT) nebulizer solution 0.25 mg  0.25 mg Nebulization BID Nadara Mustard, MD   0.25 mg at 04/18/17 0905  . docusate sodium (COLACE) capsule 100 mg  100 mg Oral BID Nadara Mustard, MD   100 mg at 04/17/17 2232  . doxercalciferol (HECTOROL) injection 1 mcg  1 mcg Intravenous Q M,W,F-HD Julien Nordmann, PA-C      . fluticasone (FLONASE) 50 MCG/ACT nasal spray 2 spray  2 spray Each Nare QHS Nadara Mustard, MD   2 spray at 04/17/17 2327  . HYDROmorphone (DILAUDID) injection 1 mg  1 mg Intravenous Q2H PRN Nadara Mustard, MD      . insulin aspart (novoLOG) injection 0-15 Units  0-15 Units Subcutaneous TID WC Nadara Mustard, MD   15 Units at 04/18/17 [phone]  . insulin aspart (novoLOG) injection 4 Units  4 Units Subcutaneous TID WC Nadara Mustard, MD   4 Units at 04/18/17 [phone]  . insulin glargine (LANTUS) injection 20 Units  20 Units Subcutaneous QHS Nadara Mustard, MD   20 Units at 04/17/17 2234  . levothyroxine (SYNTHROID, LEVOTHROID) tablet 88 mcg  88 mcg Oral QAC breakfast Nadara Mustard, MD   88 mcg at 04/18/17 8119  . magnesium citrate solution 1 Bottle  1 Bottle Oral Once PRN Nadara Mustard, MD      . methocarbamol (ROBAXIN) tablet 500 mg  500 mg Oral Q6H PRN Nadara Mustard, MD   500 mg at 04/18/17 0549  . metoCLOPramide (REGLAN) tablet  5-10 mg  5-10 mg Oral Q8H PRN Nadara Mustard, MD       Or  . metoCLOPramide (REGLAN) injection 5-10 mg  5-10 mg Intravenous Q8H PRN Nadara Mustard, MD      . midodrine (PROAMATINE) tablet 10  mg  10 mg Oral Daily Nadara Mustarduda, Marcus V, MD   10 mg at 04/18/17 [id number]  . ondansetron (ZOFRAN) tablet 4 mg  4 mg Oral Q6H PRN Nadara Mustarduda, Marcus V, MD       Or  . ondansetron [facility](ZOFRAN) injection 4 mg  4 mg Intravenous Q6H PRN Nadara Mustarduda, Marcus V, MD      . oxyCODONE (Oxy IR/ROXICODONE) immediate release tablet 5-10 mg  5-10 mg Oral Q3H PRN Nadara Mustarduda, Marcus V, MD   10 mg at 04/18/17 0549  . pantoprazole (PROTONIX) EC tablet 40 mg  40 mg Oral BID AC Nadara Mustarduda, Marcus V, MD   40 mg at 04/18/17 [id number]  . polyethylene glycol (MIRALAX / GLYCOLAX) packet 17 g  17 g Oral Daily PRN Nadara Mustarduda, Marcus V, MD      . sevelamer carbonate (RENVELA) powder PACK 2.4 g  2.4 g Oral TID WC Julien NordmannStovall, Kathryn R, PA-C        REVIEW OF SYSTEMS:   REVIEW OF SYSTEMS (negative unless checked):   Cardiac:  []  Chest pain or chest pressure? []  Shortness of breath upon activity? []  Shortness of breath when lying flat? []  Irregular heart rhythm?  Vascular:  []  Pain in calf, thigh, or hip brought on by walking? []  Pain in feet at night that wakes you up from your sleep? []  Blood clot in your veins? []  Leg swelling?  Pulmonary:  []  Oxygen at home? []  Productive cough? []  Wheezing?  Neurologic:  []  Sudden weakness in arms or legs? []  Sudden numbness in arms or legs? []  Sudden onset of difficult speaking or slurred speech? []  Temporary loss of vision in one eye? []  Problems with dizziness?  Gastrointestinal:  []  Blood in stool? []  Vomited blood?  Genitourinary:  []  Burning when urinating? []  Blood in urine?  Psychiatric:  []  Major depression  Hematologic:  []  Bleeding problems? []  Problems with blood clotting?  Dermatologic:  []  Rashes or ulcers?  Constitutional:  []  Fever or chills?  Ear/Nose/Throat:  []  Change in hearing? []  Nose  bleeds? []  Sore throat?  Musculoskeletal:  []  Back pain? []  Joint pain? []  Muscle pain?  PHYSICAL EXAM:   Vitals:   04/17/17 2006 04/17/17 2035 04/18/17 0309 04/18/17 0641  BP:  129/65 (!) 123/51 120/61  Pulse: (!) 104 94 82 75  Resp: 18 18 18 18   Temp:  98.5 F (36.9 C) 98.5 F (36.9 C) 98 F (36.7 C)  TempSrc:  Oral Oral Oral  SpO2: 98% 98% 98% 98%  Weight:      Height:        GENERAL: The patient is a well-nourished female, in no acute distress. The vital signs are documented above. HEENT: normocephalic, atraumatic. No abnormalities noted.  VASCULAR: Audible bruit right arm graft. No drainage seen from axillary incision. No induration or fluctuance around axillary incision. No pain or tenderness with palpation of right upper arm.     PULMONARY: Non labored breathing.  MUSCULOSKELETAL: Left AKA with wound vac. Right foot dressed.  NEUROLOGIC: No focal deficits.    ASSESSMENT/PLAN:   ESRD  Right upper arm graft does not appear infected. It is functioning well after recent thrombectomy and revision. Patient denies ever having any drainage from axillary incision. Will obtain duplex to make sure there is no significant fluid collection. Ok to continue using graft.   Maris BergerKimberly Trinh, PA-C [facility]  Agree with above.  Clinically feels well.  No drainage for last week.  From our standpoint  she can go home after her [place] of her arm and we will follow up on incisions and [place].  Fabienne Bruns, MD [facility] [place] Office: [phone] Pager: [phone]

## 2017-04-18 NOTE — Consult Note (Signed)
[facility] Renal Consultation Note    Indication for Consultation:  Management of ESRD/hemodialysis, anemia, hypertension/volume, and secondary hyperparathyroidism. PCP:  HPI: Kimberly [name] is a 61 y.o. female with ESRD, CAD, COPD (on home O2), Type 2 DM, Hx CVA, chronic dysphagia/vocal cord paralysis, GERD, HTN, and PVD who was admitted for L AKA and B ear tube placement on 8/7.  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 She has been followed by a wound clinic and noted that the L stump wound was worse, so referred back to vascular surgery and underwent conversion to AKA. She also was able to get her B myringotomy at the same time done for chronic serous otitis media. She also has chronic R foot ulcerations which are being following with her wound provider. Today, she is having post-surgical pain in the L stump, but otherwise has no new concerns. No CP or abdominal pain. No fever. Chronic dyspnea is stable. Her hearing is much improved s/p ear tubes.  From renal standpoint, she dialyzes MWF at [facility], due for HD today. Uses RUE AVG as her access. She tells that that she had been on IV abx with HD for "drainage" from her AVG. Reviewing HD records, looks like it was actually an axillary boil, WCx grew Providencia stuartii and Acintobacter. S/p Ceftazidime x 6 doses as output. P.Stuartii was only "intermediate" sensitive to Ceftaz, Acinetobacter was sensitive to it. She denies cannulation issues with HD. It was unclear if the AVG was involved with this abscess.  Past Medical History:  Diagnosis Date  . Anemia   . Anxiety   . Arthritis    "knees, feet, hands" (08/23/2016)  . Asthma   . CAD, NATIVE VESSEL    May 10, 2010 cath showed a hyperdynamic LV function, she had dominant circumflex anatomy with a 70-80% small OM1. She had diffuse diabetic plaque particularly in the distal LAD. She nondominant RCA.  Nondominant  . Cellulitis 10/15/2013  . CHF (congestive heart  failure) (HCC)    Preserved EF  . Chronic bronchitis (HCC)    "probably once/ yr" (08/23/2016)  . Complication of anesthesia    "I've had difficulty waking up" (08/23/2016)  . COPD   . Depression   . Diabetic neuropathy (HCC)   . Dyspnea   . Endometrial hyperplasia   . ESRD (end stage renal disease) on dialysis (HCC)    "[facility]; MWF; [facility]" (08/23/2016)  . Family history of adverse reaction to anesthesia    mother had hard time waking up  . GERD (gastroesophageal reflux disease)   . History of hiatal hernia   . HYPERLIPIDEMIA   . HYPERTENSION   . Hypothyroidism   . Myocardial infarct, old   . On home oxygen therapy    "2L; 24/7" (08/23/2016)  . Paralyzed vocal cords   . Peripheral neuropathy    hx/notes 01/27/2010  . Pneumonia "several times"  . Proliferative retinopathy    hx/notes 01/27/2010  . PVD    CEA  . Restless leg syndrome    "mostly on the right" (08/23/2016)  . 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 Type II diabetes mellitus (HCC)    Past Surgical History:  Procedure Laterality Date  . AMPUTATION Left 02/16/2017   Procedure: Left Below Knee Amputation;  Surgeon: Nadara Mustard, MD;  Location: [facility] OR;  Service: Orthopedics;  Laterality:  Left;  . AMPUTATION Left 04/17/2017   Procedure: LEFT ABOVE KNEE AMPUTATION;  Surgeon: Nadara Mustard, MD;  Location: [facility] OR;  Service: Orthopedics;  Laterality: Left;  . APPLICATION OF WOUND VAC Left 04/17/2017   Procedure: APPLICATION OF WOUND VAC;  Surgeon: Nadara Mustard, MD;  Location: [facility] OR;  Service: Orthopedics;  Laterality: Left;  . AV FISTULA PLACEMENT Right 07/08/2015   Procedure: EXPLORATION RIGHT AXILLARY ARTERY AND RIGHT BRACHIAL VEIN;  Surgeon: Chuck Hint, MD;  Location: [facility] OR;  Service: Vascular;  Laterality: Right;  . AV FISTULA PLACEMENT Right 01/02/2017   Procedure: INSERTION OF  ARTERIOVENOUS GORE-TEX  GRAFT  RIGHT ARM;  Surgeon: Chuck Hint, MD;  Location: [facility] OR;  Service: Vascular;  Laterality: Right;  . CAROTID ENDARTERECTOMY Left X 2  . CATARACT EXTRACTION W/ INTRAOCULAR LENS  IMPLANT, BILATERAL Bilateral   . CERVICAL BIOPSY  ~ 2015   "precancerous cells"  . COLONOSCOPY    . EYE SURGERY Bilateral    numerous surgeries  . INSERTION OF DIALYSIS CATHETER N/A 06/24/2015   Procedure: ULTRASOUND BILATERAL INTERNAL JUGULAR VEIN INSERTION OF DIALYSIS CATHETER LEFT INTERNAL JUGULAR VEIN ;  Surgeon: Pryor Ochoa, MD;  Location: [facility] OR;  Service: Vascular;  Laterality: N/A;  . INTRAUTERINE DEVICE INSERTION  ~ 2015  . IR REMOVAL TUN CV CATH W/O FL  02/15/2017  . MYRINGOTOMY WITH TUBE PLACEMENT Bilateral 04/17/2017   Procedure: MYRINGOTOMY WITH TUBE PLACEMENT;  Surgeon: Drema Halon, MD;  Location: [facility] OR;  Service: ENT;  Laterality: Bilateral;  . THROMBECTOMY AND REVISION OF ARTERIOVENTOUS (AV) GORETEX  GRAFT Right 03/17/2017   Procedure: THROMBECTOMY AND REVISION OF ARM  ARTERIOVENTOUS GORETEX  GRAFT USING A BY 10CM GORTEX GRAFT ;  Surgeon: Sherren Kerns, MD;  Location: [facility] OR;  Service: Vascular;  Laterality: Right;  . VITRECTOMY Bilateral    Family History  Problem Relation Age of Onset  . Cancer Mother        Breast, NHL  . Stroke Mother   . Peripheral vascular disease Father   . CAD Father 43  . Heart attack Father   . Hypertension Father   . Asthma Father   . Heart disease Father        before age 53   Social History:  reports that she quit smoking about 11 years ago. Her smoking use included Cigarettes. She has a 20.00 pack-year smoking history. She has never used smokeless tobacco. She reports that she does not drink alcohol or use drugs.  ROS: As per HPI otherwise negative.  Physical Exam: Vitals:   04/17/17 2006 04/17/17 2035 04/18/17 0309 04/18/17 0641  BP:  129/65 (!) 123/51 120/61  Pulse: (!) 104 94 82 75  Resp: 18 18 18 18    Temp:  98.5 F (36.9 C) 98.5 F (36.9 C) 98 F (36.7 C)  TempSrc:  Oral Oral Oral  SpO2: 98% 98% 98% 98%  Weight:      Height:         General: Overweight female, NAD. On chronic nasal O2. Head: Normocephalic, atraumatic, sclera non-icteric, mucus membranes are moist. Neck: Supple without lymphadenopathy/masses. Lungs: Coarse air movement and expiratory rhonchi throughout. Heart: RRR with normal S1, S2. No murmurs, rubs, or gallops appreciated. Abdomen: Soft, non-tender, non-distended with normoactive bowel sounds.  Musculoskeletal:  Strength and tone appear normal for age. Lower extremities: L AKA with wound vac in place. RLE with bandaged heel; no edema. Neuro: Alert and oriented X 3.  Moves all extremities spontaneously. Psych:  Responds to questions appropriately with a normal affect. Dialysis Access: RUE AVG + bruit, scattered small skin wounds, no active drainage tissue loss/ depression at distal end of the R axillary wound  Allergies  Allergen Reactions  . Ciprofloxacin Itching  . Epinephrine Palpitations   Prior to Admission medications   Medication Sig Start Date End Date Taking? Authorizing Provider  acetaminophen (TYLENOL) 325 MG tablet Take 2 tablets (650 mg total) by mouth every 6 (six) hours as needed for mild pain (or Fever >/= 101). 02/20/17  Yes Elgergawy, Leana Roe, MD  albuterol (PROVENTIL HFA;VENTOLIN HFA) 108 (90 Base) MCG/ACT inhaler Inhale 2 puffs into the lungs every 6 (six) hours as needed for wheezing or shortness of breath.   Yes [provider]  ALPRAZolam (XANAX) 0.5 MG tablet Take 0.5 mg by mouth every 8 (eight) hours as needed for anxiety.   Yes [provider]  aspirin EC 81 MG tablet Take 81 mg by mouth daily.   Yes [provider]  budesonide (PULMICORT) 0.25 MG/2ML nebulizer solution Take 2 mLs (0.25 mg total) by nebulization 2 (two) times daily. 03/23/17  Yes Hongalgi, Maximino Greenland, MD  collagenase (SANTYL) ointment Apply 1  application topically daily.   Yes [provider]  docusate sodium (COLACE) 100 MG capsule Take 100 mg by mouth 2 (two) times daily.   Yes [provider]  fluticasone (FLONASE) 50 MCG/ACT nasal spray Place 2 sprays into both nostrils at bedtime.   Yes [provider]  guaiFENesin-dextromethorphan (ROBITUSSIN DM) 100-10 MG/5ML syrup Take 15 mLs by mouth every 6 (six) hours as needed for cough. 03/23/17  Yes Hongalgi, Maximino Greenland, MD  hydrocortisone (ANUSOL-HC) 2.5 % rectal cream Place 1 application rectally as needed for hemorrhoids or itching.   Yes [provider]  insulin aspart (NOVOLOG) 100 UNIT/ML injection Inject 0-15 Units into the skin 3 (three) times daily with meals. CBG < 70: implement hypoglycemia protocol CBG 70 - 120: 0 units CBG 121 - 150: 2 units CBG 151 - 200: 3 units CBG 201 - 250: 5 units CBG 251 - 300: 8 units CBG 301 - 350: 11 units CBG 351 - 400: 15 units CBG > 400 call MD. Patient taking differently: Inject 0-15 Units into the skin 3 (three) times daily with meals. CBG < 70: implement hypoglycemia protocol CBG 0 - 150: 0 units CBG 151 - 200: 2 units CBG 201 - 250: 4 units CBG 251 - 300: 6 units CBG 301 - 350: 8 units CBG 351 - 400: 10 units CBG > 400 call MD. 03/23/17  Yes Hongalgi, Maximino Greenland, MD  insulin glargine (LANTUS) 100 UNIT/ML injection Inject 0.3 mLs (30 Units total) into the skin 2 (two) times daily. 03/23/17  Yes Hongalgi, Maximino Greenland, MD  ipratropium-albuterol (DUONEB) 0.5-2.5 (3) MG/3ML SOLN Take 3 mLs by nebulization every 4 (four) hours as needed. Wheezing or dyspnea. 03/23/17  Yes Hongalgi, Maximino Greenland, MD  levothyroxine (SYNTHROID, LEVOTHROID) 88 MCG tablet Take 1 tablet (88 mcg total) by mouth daily before breakfast. 10/10/16  Yes Ngetich, Dinah C, NP  lidocaine-prilocaine (EMLA) cream Apply 1 application topically See admin instructions. Apply on Monday, Wednesday and Friday for pain prior to dialysis  The cream is 2.5 %   Yes  [provider]  midodrine (PROAMATINE) 10 MG tablet Take 1 tablet (10 mg total) by mouth daily. 02/20/17  Yes Elgergawy, Leana Roe, MD  Multiple Vitamins-Minerals (MULTIVITAMIN ADULT) CHEW Chew 1  tablet by mouth daily.   Yes [provider]  OXYGEN 2lpm 24/7- [facility]   Yes [provider]  pantoprazole (PROTONIX) 40 MG tablet Take 1 tablet (40 mg total) by mouth 2 (two) times daily before a meal. 03/23/17  Yes Hongalgi, Maximino Greenland, MD  polyethylene glycol (MIRALAX / GLYCOLAX) packet Take 17 g by mouth daily as needed for mild constipation. 02/20/17  Yes Elgergawy, Leana Roe, MD  senna (SENOKOT) 8.6 MG tablet Take 2 tablets (17.2 mg total) by mouth at bedtime as needed for constipation. 02/20/17  Yes Elgergawy, Leana Roe, MD  sevelamer carbonate (RENVELA) 2.4 g PACK Take 2.4 g by mouth 3 (three) times daily with meals. 08/31/16  Yes Dorothea Ogle, MD  traMADol (ULTRAM) 50 MG tablet Take 1 tablet (50 mg total) by mouth every 6 (six) hours as needed for moderate pain or severe pain. 03/23/17  Yes Hongalgi, Maximino Greenland, MD   Current Facility-Administered Medications  Medication Dose Route Frequency Provider Last Rate Last Dose  . 0.9 %  sodium chloride infusion   Intravenous Continuous Nadara Mustard, MD      . acetaminophen (TYLENOL) tablet 650 mg  650 mg Oral Q6H PRN Nadara Mustard, MD       Or  . acetaminophen (TYLENOL) suppository 650 mg  650 mg Rectal Q6H PRN Nadara Mustard, MD      . albuterol (PROVENTIL) (2.5 MG/3ML) 0.083% nebulizer solution 2.5 mg  2.5 mg Inhalation Q6H PRN Nadara Mustard, MD      . ALPRAZolam Prudy Feeler) tablet 0.5 mg  0.5 mg Oral Q8H PRN Nadara Mustard, MD      . aspirin EC tablet 81 mg  81 mg Oral Daily Nadara Mustard, MD   81 mg at 04/18/17 4098  . bisacodyl (DULCOLAX) suppository 10 mg  10 mg Rectal Daily PRN Nadara Mustard, MD      . budesonide (PULMICORT) nebulizer solution 0.25 mg  0.25 mg Nebulization BID Nadara Mustard, MD   0.25 mg at 04/18/17 0905  .  docusate sodium (COLACE) capsule 100 mg  100 mg Oral BID Nadara Mustard, MD   100 mg at 04/17/17 2232  . fluticasone (FLONASE) 50 MCG/ACT nasal spray 2 spray  2 spray Each Nare QHS Nadara Mustard, MD   2 spray at 04/17/17 2327  . HYDROmorphone (DILAUDID) injection 1 mg  1 mg Intravenous Q2H PRN Nadara Mustard, MD      . insulin aspart (novoLOG) injection 0-15 Units  0-15 Units Subcutaneous TID WC Nadara Mustard, MD   15 Units at 04/18/17 [phone]  . insulin aspart (novoLOG) injection 4 Units  4 Units Subcutaneous TID WC Nadara Mustard, MD   4 Units at 04/18/17 [phone]  . insulin glargine (LANTUS) injection 20 Units  20 Units Subcutaneous QHS Nadara Mustard, MD   20 Units at 04/17/17 2234  . levothyroxine (SYNTHROID, LEVOTHROID) tablet 88 mcg  88 mcg Oral QAC breakfast Nadara Mustard, MD   88 mcg at 04/18/17 9562  . magnesium citrate solution 1 Bottle  1 Bottle Oral Once PRN Nadara Mustard, MD      . methocarbamol (ROBAXIN) tablet 500 mg  500 mg Oral Q6H PRN Nadara Mustard, MD   500 mg at 04/18/17 0549  . metoCLOPramide (REGLAN) tablet 5-10 mg  5-10 mg Oral Q8H PRN Nadara Mustard, MD       Or  . metoCLOPramide (REGLAN) injection 5-10  mg  5-10 mg Intravenous Q8H PRN Nadara Mustarduda, Marcus V, MD      . midodrine (PROAMATINE) tablet 10 mg  10 mg Oral Daily Nadara Mustarduda, Marcus V, MD   10 mg at 04/18/17 [id number]  . ondansetron (ZOFRAN) tablet 4 mg  4 mg Oral Q6H PRN Nadara Mustarduda, Marcus V, MD       Or  . ondansetron [facility](ZOFRAN) injection 4 mg  4 mg Intravenous Q6H PRN Nadara Mustarduda, Marcus V, MD      . oxyCODONE (Oxy IR/ROXICODONE) immediate release tablet 5-10 mg  5-10 mg Oral Q3H PRN Nadara Mustarduda, Marcus V, MD   10 mg at 04/18/17 0549  . pantoprazole (PROTONIX) EC tablet 40 mg  40 mg Oral BID AC Nadara Mustarduda, Marcus V, MD   40 mg at 04/18/17 [id number]  . polyethylene glycol (MIRALAX / GLYCOLAX) packet 17 g  17 g Oral Daily PRN Nadara Mustarduda, Marcus V, MD       Labs: Basic Metabolic Panel:  Recent Labs Lab 04/17/17 0842  NA 135  K 4.2  CL 100*  GLUCOSE 179*  BUN 20   CREATININE 4.10*   CBC:  Recent Labs Lab 04/17/17 0842  HGB 13.3  HCT 39.0   CBG:  Recent Labs Lab 04/17/17 1027 04/17/17 1210 04/17/17 1701 04/17/17 2106 04/18/17 0644  GLUCAP 166* 244* 291* 443* 364*   [facility]  [facility] BFR 400, DFR 800, EDW 100.5, 2K/2.25Ca bath, UF profile 2, linear Na, AVG - Heparin 6000 initial + 5000 mid-run bolus - Mircera 100mcg q 2 weeks (last given 75mcg on 7/25) - Hectoral 1mcg IV q HD  Assessment/Plan: 1.  Stump infection (s/p L AKA 8/7): Wound vac in place, per ortho. On IV Cefazolin now, ?peri-op dosing only. 2.  ESRD: Continue MWF schedule, will dialyze today. No heparin s/p surgery. 3.  BP/volume: BP reasonable (on midodrine), 2L UF as tolerated. EDW 4.  Anemia: Hgb 13.3, likely has dropped s/p surgery, will repeat today. 5.  Metabolic bone disease: Labs pending. Continue binders (Renvela pwdr) and Hectoral.  6. Type 2 DM: On insulin, per primary. 7.  Recent R axilla wound: S/p 6 doses of Ceftazidime as outpt PTA. See above, only intermediate coverage of abx on P.stuartii. Per outpt HD notes, there was concern that the wound was communicating with the AVG, will ask VVS to take a look to see if needs further evaluation.  Pt had drainage from R axillary wound, grew some resistant organisms.  Wound seems to be healing and no active drainage noted.  AVG area w/o boils or drainage.  Will ask VVS for their input.   8. Chronic serous otitis media (s/p B myringotomy tubes 8/7): Hearing improved, per ENT. 9. COPD: On chronic O2.  Ozzie HoyleKatie Stovall, PA-C 04/18/2017, 9:38 AM  [facility] Pager: [phone]  Pt seen, examined, agree w assess/plan as above with additions as indicated.  Vinson Moselleob Carrah Eppolito MD [facility] pager [phone]    cell [phone] 04/18/2017, 12:06 PM

## 2017-04-19 ENCOUNTER — Inpatient Hospital Stay (HOSPITAL_COMMUNITY): Payer: Medicare Other

## 2017-04-19 ENCOUNTER — Encounter (HOSPITAL_COMMUNITY): Payer: Self-pay | Admitting: General Practice

## 2017-04-19 DIAGNOSIS — Z01810 Encounter for preprocedural cardiovascular examination: Secondary | ICD-10-CM

## 2017-04-19 LAB — GLUCOSE, CAPILLARY
GLUCOSE-CAPILLARY: 179 mg/dL — AB (ref 65–99)
GLUCOSE-CAPILLARY: 233 mg/dL — AB (ref 65–99)
GLUCOSE-CAPILLARY: 138 mg/dL — AB (ref 65–99)
GLUCOSE-CAPILLARY: 128 mg/dL — AB (ref 65–99)

## 2017-04-19 MED ORDER — HYDROMORPHONE HCL 1 MG/ML IJ SOLN
1.0000 mg | INTRAMUSCULAR | Status: DC | PRN
  Administered 2017-04-19: 1 mg via INTRAVENOUS
  Filled 2017-04-19: qty 1

## 2017-04-19 NOTE — Progress Notes (Signed)
Patient ID: Kimberly ManilaSusan K Myhand, female   DOB: [birth year], 61 y.o.   MRN: [id number] Patient resting comfortably this morning. Wound VAC is functioning well. Plan for discharge back to skilled nursing facility.

## 2017-04-19 NOTE — Progress Notes (Signed)
Preliminary results by tech - Right Upper Ext. Duplex Limited Completed. There is no evidence of fluid collection around the axillary portion of the right upper arm graft noted. Kimberly [name], BS, RDMS, RVT

## 2017-04-19 NOTE — Progress Notes (Signed)
POD 2 Patient hearing a little better. No drainage from ears. Exam: Tubes patent bilateral with no drainage. TMs clear. Will follow up in my office in 6 months for recheck. Will follow up earlier if she has any problems with ears.

## 2017-04-19 NOTE — Progress Notes (Signed)
[facility] Progress Note  Subjective: in good spirits  Vitals:   04/18/17 1720 04/18/17 2034 04/18/17 2100 04/19/17 0500  BP: (!) 113/51  (!) 86/49 (!) 106/49  Pulse: 87  98 96  Resp: 16  18 20   Temp: 98.2 F (36.8 C)  98.5 F (36.9 C) 98.5 F (36.9 C)  TempSrc: Oral  Oral Oral  SpO2: 98% 98% 100% 100%  Weight: 109.5 kg (241 lb 6.5 oz)     Height:        Inpatient medications: . aspirin EC  81 mg Oral Daily  . budesonide  0.25 mg Nebulization BID  . docusate sodium  100 mg Oral BID  . doxercalciferol  1 mcg Intravenous Q M,W,F-HD  . fluticasone  2 spray Each Nare QHS  . insulin aspart  0-15 Units Subcutaneous TID WC  . insulin aspart  4 Units Subcutaneous TID WC  . insulin glargine  20 Units Subcutaneous QHS  . levothyroxine  88 mcg Oral QAC breakfast  . midodrine  10 mg Oral Daily  . pantoprazole  40 mg Oral BID AC  . sevelamer carbonate  2.4 g Oral TID WC   . sodium chloride     acetaminophen **OR** acetaminophen, albuterol, ALPRAZolam, bisacodyl, HYDROmorphone (DILAUDID) injection, magnesium citrate, methocarbamol **OR** [DISCONTINUED] methocarbamol (ROBAXIN)  IV, metoCLOPramide **OR** metoCLOPramide (REGLAN) injection, ondansetron **OR** ondansetron (ZOFRAN) IV, oxyCODONE, polyethylene glycol  Exam: Overweight female, alert, no distress No jvd Chest clear on L, rales R base RRR no mrg Abd obese soft ntnd Ext L AKA w/ VAC in place, RLE no edema RUA AVG +bruit; axillary wounds well healed, no drainage NF, Ox 3, gen'd weakness  Dialysis[place] [facility] [place] 4.5h  100.5kg   2/2.25 bath  RUA AVG  Hep 6K + 5K midrun - Mircera 100mcg q 2 weeks (last given 75mcg on 7/25) - Hectoral 1mcg IV q HD      Impression: 1  LLE stump infection - sp L AKA revision 8/7 2  ESRD MWF HD 3  Hypotension/ vol - on midodrine, doubt wts are accurate 4  Anemia - Hb 8 postop , 13 preop, transfuse prn, cont esa 5  Wound infection - R axillary wound infection being treated as OP.  VVS saw yest, no active infections, wounds healed.  Ordered US. OK to use.  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 also 8/7 7  COPD on home O2  Plan - HD tomorrow   Vinson Moselleob Mazella Deen MD [facility] pager [phone]   04/19/2017, 11:26 AM    Recent Labs Lab 04/17/17 0842 04/18/17 1409  NA 135 133*  K 4.2 5.2*  CL 100* 95*  CO2  --  22  GLUCOSE 179* 203*  BUN 20 30*  CREATININE 4.10* 5.40*  CALCIUM  --  8.9  PHOS  --  5.4*    Recent Labs Lab 04/18/17 1409  ALBUMIN 2.4*    Recent Labs Lab 04/17/17 0842 04/18/17 1410  WBC  --  11.1*  HGB 13.3 8.4*  HCT 39.0 27.1*  MCV  --  98.2  PLT  --  248   Iron/TIBC/Ferritin/ %Sat    Component Value Date/Time   IRON 45 04/13/2015 0553   TIBC 298 04/13/2015 0553   FERRITIN 98 04/13/2015 0553   IRONPCTSAT 15 04/13/2015 0553

## 2017-04-19 NOTE — Progress Notes (Signed)
PT Cancellation Note  Patient Details Name: Kimberly [name] MRN: [id number] DOB: [birth year]   Cancelled Treatment:    Reason Eval/Treat Not Completed: Patient declined, no reason specified. Pt very agitated on arrival. She was yelling to her husband over the phone and informed PTA that she would not be participating in therapy. Cancelled treatment for today. Will check back tomorrow.  [name], PTA Pager [phone] Acute Rehab   Sheral ApleyHannah E Akia Montalban 04/19/2017, 3:00 PM

## 2017-04-19 NOTE — Progress Notes (Signed)
US pending Pt can d/c from my standpoint and follow up with me in 2-4 weeks  Fabienne Brunsharles Lusia Greis, MD [facility] Office: [phone] Pager: [phone]

## 2017-04-20 ENCOUNTER — Encounter (HOSPITAL_COMMUNITY): Payer: Self-pay

## 2017-04-20 LAB — RENAL FUNCTION PANEL
ALBUMIN: 2.5 g/dL — AB (ref 3.5–5.0)
ANION GAP: 13 (ref 5–15)
BUN: 29 mg/dL — ABNORMAL HIGH (ref 6–20)
CALCIUM: 9.1 mg/dL (ref 8.9–10.3)
CO2: 26 mmol/L (ref 22–32)
Chloride: 97 mmol/L — ABNORMAL LOW (ref 101–111)
Creatinine, Ser: 5.36 mg/dL — ABNORMAL HIGH (ref 0.44–1.00)
GFR, EST AFRICAN AMERICAN: 9 mL/min — AB (ref >60)
GFR, EST NON AFRICAN AMERICAN: 8 mL/min — AB (ref >60)
Glucose, Bld: 196 mg/dL — ABNORMAL HIGH (ref 65–99)
PHOSPHORUS: 5.5 mg/dL — AB (ref 2.5–4.6)
Potassium: 5.3 mmol/L — ABNORMAL HIGH (ref 3.5–5.1)
SODIUM: 136 mmol/L (ref 135–145)

## 2017-04-20 LAB — CBC
HCT: 28.3 % — ABNORMAL LOW (ref 36.0–46.0)
HEMOGLOBIN: 8.5 g/dL — AB (ref 12.0–15.0)
MCH: 29.8 pg (ref 26.0–34.0)
MCHC: 30 g/dL (ref 30.0–36.0)
MCV: 99.3 fL (ref 78.0–100.0)
PLATELETS: 259 10*3/uL (ref 150–400)
RBC: 2.85 MIL/uL — AB (ref 3.87–5.11)
RDW: 16.4 % — ABNORMAL HIGH (ref 11.5–15.5)
WBC: 12.8 10*3/uL — ABNORMAL HIGH (ref 4.0–10.5)

## 2017-04-20 LAB — GLUCOSE, CAPILLARY
GLUCOSE-CAPILLARY: 210 mg/dL — AB (ref 65–99)
Glucose-Capillary: 155 mg/dL — ABNORMAL HIGH (ref 65–99)

## 2017-04-20 MED ORDER — DOXERCALCIFEROL 4 MCG/2ML IV SOLN
INTRAVENOUS | Status: AC
  Filled 2017-04-20: qty 2

## 2017-04-20 MED ORDER — OXYCODONE-ACETAMINOPHEN 5-325 MG PO TABS: 1.0000 | ORAL_TABLET | ORAL | 0 refills | Status: AC | PRN

## 2017-04-20 NOTE — Progress Notes (Signed)
Nurse notified [facility] that Patient has not been discharge. SNF will not accept patient due to low BP

## 2017-04-20 NOTE — Progress Notes (Signed)
[facility] On arrival, patient BP trending low 87/37, 84/38 and 93/47.  MD on call for [facility] notified and MD stated patient can be discharged. Nurse called SNF to give update on Patient regarding BP.  SNF nurse Darius stated that they are unable to accept patient at this time due to low BP according to his facility Medical director.

## 2017-04-20 NOTE — Progress Notes (Signed)
Physical Therapy Treatment Patient Details Name: Kimberly ManilaSusan K Kumari MRN: [id number] DOB: June 03, 1956 Today's Date: 04/20/2017    History of Present Illness Patient is a 61 y/o female admitted due to dehiscence of L transtibial amputation now s/p AKA on 04/17/17.  Also with recent diagnosis of pneumonia and R foot ulcers.  She has PMH of ESRD on HD MWF, GERD, CAD, CVA w/ R hemiparesis and vocal cord paralysis, HTN. PVD and peripheral neuropathy.    PT Comments    Continues to allow pain to limit her participation and today distracted by relationship issues.  She continues to need skilled PT to encourage mobility, coordinate with nursing for pain control and to maximize OOB to return to  prior level of function.     Follow Up Recommendations  SNF     Equipment Recommendations  None recommended by PT    Recommendations for Other Services       Precautions / Restrictions Precautions Precautions: Fall Precaution Comments: R UE new graft Restrictions LLE Weight Bearing: Non weight bearing    Mobility  Bed Mobility Overal bed mobility: Needs Assistance Bed Mobility: Rolling Rolling: Mod assist;+2 for physical assistance         General bed mobility comments: assist to roll for positioning, removed pillow under L leg and placed pillow under R hip to keep from leaning to R; elevated HOB with meal tray up close.  pt continues to decline OOB via lift due to pain and reports we can't do it without hurting her  Transfers                    Ambulation/Gait                 Stairs            Wheelchair Mobility    Modified Rankin (Stroke Patients Only)       Balance                                            Cognition Arousal/Alertness: Awake/alert Behavior During Therapy: Anxious Overall Cognitive Status: Within Functional Limits for tasks assessed                                        Exercises      General  Comments General comments (skin integrity, edema, etc.): pt anxious wanted assist to call brothers due to her spouse not coming to visit and leaving once he came due to "ready to be done with it all"      Pertinent Vitals/Pain Pain Assessment: Faces Faces Pain Scale: Hurts whole lot Pain Location: L LE with movement in bed Pain Descriptors / Indicators: Operative site guarding;Grimacing;Tender Pain Intervention(s): Monitored during session;Repositioned;Limited activity within patient's tolerance    Home Living                      Prior Function            PT Goals (current goals can now be found in the care plan section) Progress towards PT goals: Not progressing toward goals - comment    Frequency    Min 2X/week      PT Plan Current plan remains appropriate    Co-evaluation  AM-PAC PT "6 Clicks" Daily Activity  Outcome Measure  Difficulty turning over in bed (including adjusting bedclothes, sheets and blankets)?: Total Difficulty moving from lying on back to sitting on the side of the bed? : Total Difficulty sitting down on and standing up from a chair with arms (e.g., wheelchair, bedside commode, etc,.)?: Total Help needed moving to and from a bed to chair (including a wheelchair)?: Total Help needed walking in hospital room?: Total Help needed climbing 3-5 steps with a railing? : Total 6 Click Score: 6    End of Session   Activity Tolerance: Patient limited by pain Patient left: in bed;with call bell/phone within reach Nurse Communication: Need for lift equipment PT Visit Diagnosis: Other abnormalities of gait and mobility (R26.89);Pain Pain - Right/Left: Left Pain - part of body: Leg     Time: 0840-0900 PT Time Calculation (min) (ACUTE ONLY): 20 min  Charges:  $Therapeutic Activity: 8-22 mins                    G CodesSheran Lawless, [place] [phone] 04/20/2017    Elray Mcgregor 04/20/2017, 2:24 PM

## 2017-04-20 NOTE — Progress Notes (Signed)
Graft without erythema or drainage No fluid on US. Will sign off  Fabienne Brunsharles Bradlee Heitman, MD [facility] Office: [phone] Pager: [phone]

## 2017-04-20 NOTE — Progress Notes (Signed)
[facility] Progress Note  Subjective: in good spirits  Vitals:   04/19/17 1500 04/19/17 2212 04/20/17 0400 04/20/17 0738  BP: (!) 99/47 (!) 116/39 (!) 97/47   Pulse: 97 97 (!) 101   Resp: 18 20 16    Temp: 98.2 F (36.8 C) 98.4 F (36.9 C) 98.3 F (36.8 C)   TempSrc: Oral Oral Oral   SpO2: 100% 100% 100% 100%  Weight:      Height:        Inpatient medications: . aspirin EC  81 mg Oral Daily  . budesonide  0.25 mg Nebulization BID  . docusate sodium  100 mg Oral BID  . doxercalciferol  1 mcg Intravenous Q M,W,F-HD  . fluticasone  2 spray Each Nare QHS  . insulin aspart  0-15 Units Subcutaneous TID WC  . insulin aspart  4 Units Subcutaneous TID WC  . insulin glargine  20 Units Subcutaneous QHS  . levothyroxine  88 mcg Oral QAC breakfast  . midodrine  10 mg Oral Daily  . pantoprazole  40 mg Oral BID AC  . sevelamer carbonate  2.4 g Oral TID WC   . 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 VVS saw  yest, no active infections, wounds healed.  Ordered US. OK to use.  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 also 8/7 7  COPD on home O2 8  Dispo - for dc to SNF possibly today after HD  Plan - HD today    Vinson Moselleob Brodan Grewell MD [facility] pager [phone]   04/20/2017, 12:02 PM    Recent Labs Lab 04/17/17 0842 04/18/17 1409 04/20/17 1015  NA 135 133* 136  K 4.2 5.2* 5.3*  CL 100* 95* 97*  CO2  --  22 26  GLUCOSE 179* 203* 196*  BUN 20 30* 29*  CREATININE 4.10* 5.40* 5.36*  CALCIUM  --  8.9 9.1  PHOS  --  5.4* 5.5*    Recent Labs Lab 04/18/17 1409 04/20/17 1015  ALBUMIN 2.4* 2.5*    Recent Labs Lab 04/17/17 0842 04/18/17 1410 04/20/17 1015  WBC  --  11.1* 12.8*  HGB 13.3 8.4* 8.5*  HCT 39.0 27.1* 28.3*  MCV  --  98.2 99.3  PLT  --  248 259   Iron/TIBC/Ferritin/ %Sat    Component Value Date/Time   IRON 45 04/13/2015 0553   TIBC 298 04/13/2015 0553   FERRITIN 98 04/13/2015 0553   IRONPCTSAT 15  04/13/2015 0553     

## 2017-04-20 NOTE — Progress Notes (Signed)
RN called report to SNF and called [facility] for transport to SNF.

## 2017-04-20 NOTE — Discharge Summary (Signed)
Discharge Diagnoses:  Active Problems:   Above knee amputation of left lower extremity (HCC)   Surgeries: Procedure(s): LEFT ABOVE KNEE AMPUTATION MYRINGOTOMY WITH TUBE PLACEMENT APPLICATION OF WOUND VAC on 04/17/2017    Consultants: Treatment Team:  Delano MetzSchertz, Robert, MD Sherren KernsFields, Charles E, MD  Discharged Condition: Improved  Hospital Course: Kimberly ManilaSusan K [name] is an 61 y.o. female who was admitted 04/17/2017 with a chief complaint of dehiscence left transtibial amputation, with a final diagnosis of left below knee amputation dehiscence.  Patient was brought to the operating room on 04/17/2017 and underwent Procedure(s): LEFT ABOVE KNEE AMPUTATION MYRINGOTOMY WITH TUBE PLACEMENT APPLICATION OF WOUND VAC.    Patient was given perioperative antibiotics: Anti-infectives    Start     Dose/Rate Route Frequency Ordered Stop   04/17/17 1700  ceFAZolin (ANCEF) IVPB 2g/100 mL premix     2 g 200 mL/hr over 30 Minutes Intravenous Every 6 hours 04/17/17 1553 04/18/17 0604   04/17/17 0756  ceFAZolin (ANCEF) IVPB 2g/100 mL premix     2 g 200 mL/hr over 30 Minutes Intravenous On call to O.R. 04/17/17 0756 04/17/17 1055    .  Patient was given sequential compression devices, early ambulation, and aspirin for DVT prophylaxis.  Recent vital signs: Patient Vitals for the past 24 hrs:  BP Temp Temp src Pulse Resp SpO2  04/20/17 0400 (!) 97/47 98.3 F (36.8 C) Oral (!) 101 16 100 %  04/19/17 2212 (!) 116/39 98.4 F (36.9 C) Oral 97 20 100 %  04/19/17 1500 (!) 99/47 98.2 F (36.8 C) Oral 97 18 100 %  .  Recent laboratory studies: No results found.  Discharge Medications:   Allergies as of 04/20/2017      Reactions   Ciprofloxacin Itching   Epinephrine Palpitations      Medication List    STOP taking these medications   collagenase ointment Commonly known as:  SANTYL     TAKE these medications   acetaminophen 325 MG tablet Commonly known as:  TYLENOL Take 2 tablets (650 mg total) by mouth  every 6 (six) hours as needed for mild pain (or Fever >/= 101).   albuterol 108 (90 Base) MCG/ACT inhaler Commonly known as:  PROVENTIL HFA;VENTOLIN HFA Inhale 2 puffs into the lungs every 6 (six) hours as needed for wheezing or shortness of breath.   ALPRAZolam 0.5 MG tablet Commonly known as:  XANAX Take 0.5 mg by mouth every 8 (eight) hours as needed for anxiety.   aspirin EC 81 MG tablet Take 81 mg by mouth daily.   budesonide 0.25 MG/2ML nebulizer solution Commonly known as:  PULMICORT Take 2 mLs (0.25 mg total) by nebulization 2 (two) times daily.   docusate sodium 100 MG capsule Commonly known as:  COLACE Take 100 mg by mouth 2 (two) times daily.   fluticasone 50 MCG/ACT nasal spray Commonly known as:  FLONASE Place 2 sprays into both nostrils at bedtime.   guaiFENesin-dextromethorphan 100-10 MG/5ML syrup Commonly known as:  ROBITUSSIN DM Take 15 mLs by mouth every 6 (six) hours as needed for cough.   hydrocortisone 2.5 % rectal cream Commonly known as:  ANUSOL-HC Place 1 application rectally as needed for hemorrhoids or itching.   insulin aspart 100 UNIT/ML injection Commonly known as:  novoLOG Inject 0-15 Units into the skin 3 (three) times daily with meals. CBG < 70: implement hypoglycemia protocol CBG 70 - 120: 0 units CBG 121 - 150: 2 units CBG 151 - 200: 3 units CBG 201 -  250: 5 units CBG 251 - 300: 8 units CBG 301 - 350: 11 units CBG 351 - 400: 15 units CBG > 400 call MD. What changed:  additional instructions   insulin glargine 100 UNIT/ML injection Commonly known as:  LANTUS Inject 0.3 mLs (30 Units total) into the skin 2 (two) times daily.   ipratropium-albuterol 0.5-2.5 (3) MG/3ML Soln Commonly known as:  DUONEB Take 3 mLs by nebulization every 4 (four) hours as needed. Wheezing or dyspnea.   levothyroxine 88 MCG tablet Commonly known as:  SYNTHROID, LEVOTHROID Take 1 tablet (88 mcg total) by mouth daily before breakfast.   lidocaine-prilocaine  cream Commonly known as:  EMLA Apply 1 application topically See admin instructions. Apply on Monday, Wednesday and Friday for pain prior to dialysis  The cream is 2.5 %   midodrine 10 MG tablet Commonly known as:  PROAMATINE Take 1 tablet (10 mg total) by mouth daily.   MULTIVITAMIN ADULT Chew Chew 1 tablet by mouth daily.   oxyCODONE-acetaminophen 5-325 MG tablet Commonly known as:  ROXICET Take 1 tablet by mouth every 4 (four) hours as needed for severe pain.   OXYGEN 2lpm 24/7- [facility]   pantoprazole 40 MG tablet Commonly known as:  PROTONIX Take 1 tablet (40 mg total) by mouth 2 (two) times daily before a meal.   polyethylene glycol packet Commonly known as:  MIRALAX / GLYCOLAX Take 17 g by mouth daily as needed for mild constipation.   senna 8.6 MG tablet Commonly known as:  SENOKOT Take 2 tablets (17.2 mg total) by mouth at bedtime as needed for constipation.   sevelamer carbonate 2.4 g Pack Commonly known as:  RENVELA Take 2.4 g by mouth 3 (three) times daily with meals.   traMADol 50 MG tablet Commonly known as:  ULTRAM Take 1 tablet (50 mg total) by mouth every 6 (six) hours as needed for moderate pain or severe pain.       Diagnostic Studies: Dg Chest 2 View  Result Date: 03/21/2017 CLINICAL DATA:  Pneumonia. EXAM: CHEST  2 VIEW COMPARISON:  03/18/2017, 03/16/2017, 03/14/2017 and 02/18/2017 FINDINGS: Interstitial infiltrates have cleared. The lungs are now clear. Heart size and pulmonary vascularity are normal. IMPRESSION: Resolution of pulmonary infiltrates.  No effusions. Electronically Signed   By: Francene Boyers M.D.   On: 03/21/2017 15:25    Patient benefited maximally from their hospital stay and there were no complications.     Disposition: 03-Skilled Nursing Facility Discharge Instructions    Call MD / Call 911    Complete by:  As directed    If you experience chest pain or shortness of breath, CALL 911 and be transported to the hospital emergency  room.  If you develope a fever above 101 F, pus (white drainage) or increased drainage or redness at the wound, or calf pain, call your surgeon's office.   Constipation Prevention    Complete by:  As directed    Drink plenty of fluids.  Prune juice may be helpful.  You may use a stool softener, such as Colace (over the counter) 100 mg twice a day.  Use MiraLax (over the counter) for constipation as needed.   Diet - low sodium heart healthy    Complete by:  As directed    Increase activity slowly as tolerated    Complete by:  As directed    Negative Pressure Wound Therapy - Incisional    Complete by:  As directed    Prevena portable wound VAC  leave in place for 1 week then removed and start dry dressing changes     Follow-up Information    Adonis Huguenin, NP Follow up in 1 week(s).   Specialty:  Orthopedic Surgery Contact information: [address] [place] [address] [phone]        Sherren Kerns, MD Follow up in 3 week(s).   Specialties:  Vascular Surgery, Cardiology Why:  Office will call you to arrange your appt (sent) Contact information: [address] [place] [address] [phone]        Drema Halon, MD. Schedule an appointment as soon as possible for a visit in 6 month(s).   Specialty:  Otolaryngology Why:  For recheck of tubes. Call earlier if you have any problems or questions. Contact information: [address] [place] [address] [phone]            Signed: Nadara Mustard 04/20/2017, 6:20 AM

## 2017-04-20 NOTE — Progress Notes (Signed)
Pt's DNR is not signed by a MD. RN notified MD and he stated that Dr. Roda ShuttersXu would sign it in the OR. RN took DNR to OR to be signed and they stated they would tube it back to this floor after the MD signs it.

## 2017-04-20 NOTE — Clinical Social Work Note (Addendum)
[facility] Kimberly [name] after completing her dialysis treatment. Facility notified of patient's readiness for discharge and d/c summary transmitted to admissions director via the HUB. Patient's nurse getting DNR form signed.  Husband, Kimberly [name] contacted (5:09 pm) and informed of discharge and that his wife was in dialysis. Mr. Kimberly [name] plans to come to hospital and get patient's wheelchair, hoyer pad and other belongings as he ws informed [facility] does not transport durable medical equipment.   Kimberly [name], MSW, [name] Licensed Clinical Social Worker Clinical Social Work Department [facility] [phone]

## 2017-04-21 ENCOUNTER — Inpatient Hospital Stay (HOSPITAL_COMMUNITY): Payer: Medicare Other

## 2017-04-21 DIAGNOSIS — J9622 Acute and chronic respiratory failure with hypercapnia: Secondary | ICD-10-CM | POA: Diagnosis present

## 2017-04-21 DIAGNOSIS — E872 Acidosis: Secondary | ICD-10-CM | POA: Diagnosis present

## 2017-04-21 DIAGNOSIS — R579 Shock, unspecified: Secondary | ICD-10-CM | POA: Diagnosis not present

## 2017-04-21 DIAGNOSIS — J9600 Acute respiratory failure, unspecified whether with hypoxia or hypercapnia: Secondary | ICD-10-CM | POA: Diagnosis not present

## 2017-04-21 DIAGNOSIS — E871 Hypo-osmolality and hyponatremia: Secondary | ICD-10-CM | POA: Diagnosis present

## 2017-04-21 DIAGNOSIS — I5032 Chronic diastolic (congestive) heart failure: Secondary | ICD-10-CM | POA: Diagnosis present

## 2017-04-21 DIAGNOSIS — I2510 Atherosclerotic heart disease of native coronary artery without angina pectoris: Secondary | ICD-10-CM | POA: Diagnosis present

## 2017-04-21 DIAGNOSIS — E1122 Type 2 diabetes mellitus with diabetic chronic kidney disease: Secondary | ICD-10-CM | POA: Diagnosis not present

## 2017-04-21 DIAGNOSIS — J189 Pneumonia, unspecified organism: Secondary | ICD-10-CM | POA: Diagnosis present

## 2017-04-21 DIAGNOSIS — J9621 Acute and chronic respiratory failure with hypoxia: Secondary | ICD-10-CM | POA: Diagnosis present

## 2017-04-21 DIAGNOSIS — F419 Anxiety disorder, unspecified: Secondary | ICD-10-CM | POA: Diagnosis present

## 2017-04-21 DIAGNOSIS — I517 Cardiomegaly: Secondary | ICD-10-CM | POA: Diagnosis not present

## 2017-04-21 DIAGNOSIS — I252 Old myocardial infarction: Secondary | ICD-10-CM | POA: Diagnosis not present

## 2017-04-21 DIAGNOSIS — Z89612 Acquired absence of left leg above knee: Secondary | ICD-10-CM | POA: Diagnosis not present

## 2017-04-21 DIAGNOSIS — R402441 Other coma, without documented Glasgow coma scale score, or with partial score reported, in the field [EMT or ambulance]: Secondary | ICD-10-CM | POA: Diagnosis not present

## 2017-04-21 DIAGNOSIS — I480 Paroxysmal atrial fibrillation: Secondary | ICD-10-CM | POA: Diagnosis not present

## 2017-04-21 DIAGNOSIS — Z4682 Encounter for fitting and adjustment of non-vascular catheter: Secondary | ICD-10-CM | POA: Diagnosis not present

## 2017-04-21 DIAGNOSIS — L03116 Cellulitis of left lower limb: Secondary | ICD-10-CM | POA: Diagnosis not present

## 2017-04-21 DIAGNOSIS — Z66 Do not resuscitate: Secondary | ICD-10-CM | POA: Diagnosis present

## 2017-04-21 DIAGNOSIS — Y95 Nosocomial condition: Secondary | ICD-10-CM | POA: Diagnosis present

## 2017-04-21 DIAGNOSIS — I69351 Hemiplegia and hemiparesis following cerebral infarction affecting right dominant side: Secondary | ICD-10-CM | POA: Diagnosis not present

## 2017-04-21 DIAGNOSIS — K640 First degree hemorrhoids: Secondary | ICD-10-CM | POA: Diagnosis not present

## 2017-04-21 DIAGNOSIS — F329 Major depressive disorder, single episode, unspecified: Secondary | ICD-10-CM | POA: Diagnosis present

## 2017-04-21 DIAGNOSIS — E662 Morbid (severe) obesity with alveolar hypoventilation: Secondary | ICD-10-CM | POA: Diagnosis not present

## 2017-04-21 DIAGNOSIS — S8990XA Unspecified injury of unspecified lower leg, initial encounter: Secondary | ICD-10-CM | POA: Diagnosis not present

## 2017-04-21 DIAGNOSIS — E1142 Type 2 diabetes mellitus with diabetic polyneuropathy: Secondary | ICD-10-CM | POA: Diagnosis present

## 2017-04-21 DIAGNOSIS — S78119A Complete traumatic amputation at level between unspecified hip and knee, initial encounter: Secondary | ICD-10-CM | POA: Diagnosis not present

## 2017-04-21 DIAGNOSIS — Z992 Dependence on renal dialysis: Secondary | ICD-10-CM | POA: Diagnosis not present

## 2017-04-21 DIAGNOSIS — E785 Hyperlipidemia, unspecified: Secondary | ICD-10-CM | POA: Diagnosis present

## 2017-04-21 DIAGNOSIS — E6609 Other obesity due to excess calories: Secondary | ICD-10-CM | POA: Diagnosis not present

## 2017-04-21 DIAGNOSIS — Z452 Encounter for adjustment and management of vascular access device: Secondary | ICD-10-CM | POA: Diagnosis not present

## 2017-04-21 DIAGNOSIS — K219 Gastro-esophageal reflux disease without esophagitis: Secondary | ICD-10-CM | POA: Diagnosis present

## 2017-04-21 DIAGNOSIS — R69 Illness, unspecified: Secondary | ICD-10-CM | POA: Diagnosis not present

## 2017-04-21 DIAGNOSIS — I509 Heart failure, unspecified: Secondary | ICD-10-CM | POA: Diagnosis not present

## 2017-04-21 DIAGNOSIS — Z22322 Carrier or suspected carrier of Methicillin resistant Staphylococcus aureus: Secondary | ICD-10-CM | POA: Diagnosis not present

## 2017-04-21 DIAGNOSIS — N186 End stage renal disease: Secondary | ICD-10-CM | POA: Diagnosis not present

## 2017-04-21 DIAGNOSIS — I132 Hypertensive heart and chronic kidney disease with heart failure and with stage 5 chronic kidney disease, or end stage renal disease: Secondary | ICD-10-CM | POA: Diagnosis present

## 2017-04-21 DIAGNOSIS — L97523 Non-pressure chronic ulcer of other part of left foot with necrosis of muscle: Secondary | ICD-10-CM | POA: Diagnosis not present

## 2017-04-21 DIAGNOSIS — D631 Anemia in chronic kidney disease: Secondary | ICD-10-CM | POA: Diagnosis not present

## 2017-04-21 DIAGNOSIS — J9611 Chronic respiratory failure with hypoxia: Secondary | ICD-10-CM | POA: Diagnosis not present

## 2017-04-21 DIAGNOSIS — Z9981 Dependence on supplemental oxygen: Secondary | ICD-10-CM | POA: Diagnosis not present

## 2017-04-21 DIAGNOSIS — Z4659 Encounter for fitting and adjustment of other gastrointestinal appliance and device: Secondary | ICD-10-CM | POA: Diagnosis not present

## 2017-04-21 DIAGNOSIS — R6521 Severe sepsis with septic shock: Secondary | ICD-10-CM | POA: Diagnosis not present

## 2017-04-21 DIAGNOSIS — E1129 Type 2 diabetes mellitus with other diabetic kidney complication: Secondary | ICD-10-CM | POA: Diagnosis not present

## 2017-04-21 DIAGNOSIS — J440 Chronic obstructive pulmonary disease with acute lower respiratory infection: Secondary | ICD-10-CM | POA: Diagnosis present

## 2017-04-21 DIAGNOSIS — K449 Diaphragmatic hernia without obstruction or gangrene: Secondary | ICD-10-CM | POA: Diagnosis present

## 2017-04-21 DIAGNOSIS — E039 Hypothyroidism, unspecified: Secondary | ICD-10-CM | POA: Diagnosis present

## 2017-04-21 DIAGNOSIS — J449 Chronic obstructive pulmonary disease, unspecified: Secondary | ICD-10-CM | POA: Diagnosis not present

## 2017-04-21 DIAGNOSIS — R05 Cough: Secondary | ICD-10-CM | POA: Diagnosis not present

## 2017-04-21 DIAGNOSIS — N2581 Secondary hyperparathyroidism of renal origin: Secondary | ICD-10-CM | POA: Diagnosis present

## 2017-04-21 DIAGNOSIS — G9340 Encephalopathy, unspecified: Secondary | ICD-10-CM | POA: Diagnosis not present

## 2017-04-21 DIAGNOSIS — I447 Left bundle-branch block, unspecified: Secondary | ICD-10-CM | POA: Diagnosis not present

## 2017-04-21 DIAGNOSIS — I110 Hypertensive heart disease with heart failure: Secondary | ICD-10-CM | POA: Diagnosis not present

## 2017-04-21 DIAGNOSIS — I959 Hypotension, unspecified: Secondary | ICD-10-CM | POA: Diagnosis not present

## 2017-04-21 DIAGNOSIS — A419 Sepsis, unspecified organism: Secondary | ICD-10-CM | POA: Diagnosis not present

## 2017-04-21 DIAGNOSIS — Z0189 Encounter for other specified special examinations: Secondary | ICD-10-CM | POA: Diagnosis not present

## 2017-04-21 DIAGNOSIS — L03119 Cellulitis of unspecified part of limb: Secondary | ICD-10-CM | POA: Diagnosis not present

## 2017-04-21 LAB — GLUCOSE, CAPILLARY
GLUCOSE-CAPILLARY: 142 mg/dL — AB (ref 65–99)
GLUCOSE-CAPILLARY: 171 mg/dL — AB (ref 65–99)
Glucose-Capillary: 137 mg/dL — ABNORMAL HIGH (ref 65–99)

## 2017-04-21 NOTE — Progress Notes (Signed)
Patient ID: [name], female   DOB: 06/11/1956, 61 y.o.   MRN: [id number] Awake and alert this am.  Discharge held last evening due to the patient being hypotensive.  However, dialysis patients can run low blood pressures.  He blood pressure is back to baseline this morning with no intervention.  Will attempt discharge again to skilled nursing.  AKA stump stable.

## 2017-04-21 NOTE — Progress Notes (Signed)
[facility] Progress Note  Subjective: pt sent to SNF then sent back due to low BP's (high 80's and 90's).  Pt c/o cough/ wheezing, "my asthma flaring up"  Vitals:   04/21/17 0641 04/21/17 0743 04/21/17 0752 04/21/17 0900  BP: (!) 100/40 (!) 94/47 (!) 100/58   Pulse: 93   (!) 109  Resp:    20  Temp: 97.9 F (36.6 C)     TempSrc: Oral     SpO2: 100%   97%  Weight:      Height:        Inpatient medications: . aspirin EC  81 mg Oral Daily  . budesonide  0.25 mg Nebulization BID  . docusate sodium  100 mg Oral BID  . doxercalciferol  1 mcg Intravenous Q M,W,F-HD  . fluticasone  2 spray Each Nare QHS  . insulin aspart  0-15 Units Subcutaneous TID WC  . insulin aspart  4 Units Subcutaneous TID WC  . insulin glargine  20 Units Subcutaneous QHS  . levothyroxine  88 mcg Oral QAC breakfast  . midodrine  10 mg Oral Daily  . pantoprazole  40 mg Oral BID AC  . sevelamer carbonate  2.4 g Oral TID WC   . 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 VVS saw yest, no active infections, wounds healed.  Ordered US. OK to use.  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 also 8/7 7  COPD on home O2 8  Dispo - sent back due to low BP's yest  Plan - for attempted DC again today   [name] MD [facility] pager [phone]   04/21/2017, 9:42 AM    Recent Labs Lab 04/17/17 0842 04/18/17 1409 04/20/17 1015  NA 135 133* 136  K 4.2 5.2* 5.3*  CL 100* 95* 97*  CO2  --  22 26  GLUCOSE 179* 203* 196*  BUN 20 30* 29*  CREATININE 4.10* 5.40* 5.36*  CALCIUM  --  8.9 9.1  PHOS  --  5.4* 5.5*    Recent Labs Lab 04/18/17 1409 04/20/17 1015  ALBUMIN 2.4* 2.5*    Recent Labs Lab 04/17/17 0842 04/18/17 1410 04/20/17 1015  WBC  --  11.1* 12.8*  HGB 13.3 8.4* 8.5*  HCT 39.0 27.1* 28.3*  MCV  --  98.2 99.3  PLT  --  248 259   Iron/TIBC/Ferritin/ %Sat  Component Value Date/Time   IRON 45 04/13/2015 0553   TIBC 298 04/13/2015 0553   FERRITIN 98 04/13/2015 0553   IRONPCTSAT 15 04/13/2015 0553

## 2017-04-21 NOTE — Progress Notes (Signed)
[facility], left direct number for call back as nurse was not currently available. Gildardo CrankerAnita Priddy, RN

## 2017-04-21 NOTE — Discharge Summary (Signed)
Patient ID: Kimberly [name] MRN: [id number] DOB/AGE: Oct 16, 1955 61 y.o.  Admit date: 04/17/2017 Discharge date: 04/21/2017  Admission Diagnoses:  Active Problems:   Above knee amputation of left lower extremity [facility])   Discharge Diagnoses:  Same  Past Medical History:  Diagnosis Date  . Anemia   . Anxiety   . Arthritis    "knees, feet, hands" (08/23/2016)  . Asthma   . CAD, NATIVE VESSEL    May 10, 2010 cath showed a hyperdynamic LV function, she had dominant circumflex anatomy with a 70-80% small OM1. She had diffuse diabetic plaque particularly in the distal LAD. She nondominant RCA.  Nondominant  . Cellulitis 10/15/2013  . CHF (congestive heart failure) (HCC)    Preserved EF  . Chronic bronchitis (HCC)    "probably once/ yr" (08/23/2016)  . Complication of anesthesia    "I've had difficulty waking up" (08/23/2016)  . COPD   . Depression   . Diabetic neuropathy (HCC)   . Dyspnea   . Endometrial hyperplasia   . ESRD (end stage renal disease) on dialysis (HCC)    "[facility]; MWF; [facility]" (08/23/2016)  . Family history of adverse reaction to anesthesia    mother had hard time waking up  . GERD (gastroesophageal reflux disease)   . History of hiatal hernia   . HYPERLIPIDEMIA   . HYPERTENSION   . Hypothyroidism   . Myocardial infarct, old   . On home oxygen therapy    "2L; 24/7" (08/23/2016)  . Paralyzed vocal cords   . Peripheral neuropathy    hx/notes 01/27/2010  . Pneumonia "several times"  . Proliferative retinopathy    hx/notes 01/27/2010  . PVD    CEA  . Restless leg syndrome    "mostly on the right" (08/23/2016)  . 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 Type II diabetes mellitus (HCC)     Surgeries: Procedure(s): LEFT ABOVE KNEE AMPUTATION MYRINGOTOMY WITH TUBE PLACEMENT APPLICATION OF WOUND VAC on 04/17/2017   Consultants:  Treatment Team:  Delano Metz, MD  Discharged Condition: Improved  Hospital Course: Kimberly [name] is an 61 y.o. female who was admitted 04/17/2017 for operative treatment of<principal problem not specified>. Patient has severe unremitting pain that affects sleep, daily activities, and work/hobbies. After pre-op clearance the patient was taken to the operating room on 04/17/2017 and underwent  Procedure(s): LEFT ABOVE KNEE AMPUTATION MYRINGOTOMY WITH TUBE PLACEMENT APPLICATION OF WOUND VAC.    Patient was given perioperative antibiotics: Anti-infectives    Start     Dose/Rate Route Frequency Ordered Stop   04/17/17 1700  ceFAZolin (ANCEF) IVPB 2g/100 mL premix     2 g 200 mL/hr over 30 Minutes Intravenous Every 6 hours 04/17/17 1553 04/18/17 0604   04/17/17 0756  ceFAZolin (ANCEF) IVPB 2g/100 mL premix     2 g 200 mL/hr over 30 Minutes Intravenous On call to O.R. 04/17/17 0756 04/17/17 1055       Patient was given sequential compression devices, early ambulation, and chemoprophylaxis to prevent DVT.  Patient benefited maximally from hospital stay and there were no complications.    Recent vital signs: Patient Vitals for the past 24 hrs:  BP Temp Temp src Pulse Resp SpO2 Weight  04/21/17 0752 (!) 100/58 - - - - - -  04/21/17 0743 (!) 94/47 - - - - - -  04/21/17 7829 Marland Kitchen)  100/40 97.9 F (36.6 C) Oral 93 - 100 % -  04/20/17 2100 (!) 93/47 - - - - - -  04/20/17 2050 (!) 84/38 - - - - - -  04/20/17 2045 (!) 87/37 - - - - - -  04/20/17 2042 - - - 88 20 100 % -  04/20/17 2000 (!) 100/44 (!) 97.5 F (36.4 C) Oral 87 18 100 % -  04/20/17 1810 (!) 118/58 97.7 F (36.5 C) Oral 91 18 98 % -  04/20/17 1800 105/75 - - 90 18 - -  04/20/17 1730 108/80 - - 88 20 - -  04/20/17 1700 106/76 - - 89 18 - -  04/20/17 1630 (!) 113/47 - - 88 17 - -  04/20/17 1615 (!) 79/33 - - 90 18 - -  04/20/17 1600 (!) 108/49 - - 90 17 - -  04/20/17 1530 (!) 98/52 - - 88 16 - -  04/20/17 1500 (!) 93/48 - - 89 17  - -  04/20/17 1415 (!) 105/55 - - 90 18 - -  04/20/17 1410 (!) 110/48 - - 90 20 - -  04/20/17 1343 115/70 98.6 F (37 C) Oral 91 19 100 % 217 lb 6 oz (98.6 kg)  04/20/17 1300 111/62 - - 92 - - -     Recent laboratory studies:  Recent Labs  04/18/17 1409 04/18/17 1410 04/20/17 1015  WBC  --  11.1* 12.8*  HGB  --  8.4* 8.5*  HCT  --  27.1* 28.3*  PLT  --  248 259  NA 133*  --  136  K 5.2*  --  5.3*  CL 95*  --  97*  CO2 22  --  26  BUN 30*  --  29*  CREATININE 5.40*  --  5.36*  GLUCOSE 203*  --  196*  CALCIUM 8.9  --  9.1     Discharge Medications:   Allergies as of 04/21/2017      Reactions   Ciprofloxacin Itching   Epinephrine Palpitations      Medication List    STOP taking these medications   collagenase ointment Commonly known as:  SANTYL     TAKE these medications   acetaminophen 325 MG tablet Commonly known as:  TYLENOL Take 2 tablets (650 mg total) by mouth every 6 (six) hours as needed for mild pain (or Fever >/= 101).   albuterol 108 (90 Base) MCG/ACT inhaler Commonly known as:  PROVENTIL HFA;VENTOLIN HFA Inhale 2 puffs into the lungs every 6 (six) hours as needed for wheezing or shortness of breath.   ALPRAZolam 0.5 MG tablet Commonly known as:  XANAX Take 0.5 mg by mouth every 8 (eight) hours as needed for anxiety.   aspirin EC 81 MG tablet Take 81 mg by mouth daily.   budesonide 0.25 MG/2ML nebulizer solution Commonly known as:  PULMICORT Take 2 mLs (0.25 mg total) by nebulization 2 (two) times daily.   docusate sodium 100 MG capsule Commonly known as:  COLACE Take 100 mg by mouth 2 (two) times daily.   fluticasone 50 MCG/ACT nasal spray Commonly known as:  FLONASE Place 2 sprays into both nostrils at bedtime.   guaiFENesin-dextromethorphan 100-10 MG/5ML syrup Commonly known as:  ROBITUSSIN DM Take 15 mLs by mouth every 6 (six) hours as needed for cough.   hydrocortisone 2.5 % rectal cream Commonly known as:  ANUSOL-HC Place 1  application rectally as needed for hemorrhoids or itching.   insulin aspart 100  UNIT/ML injection Commonly known as:  novoLOG Inject 0-15 Units into the skin 3 (three) times daily with meals. CBG < 70: implement hypoglycemia protocol CBG 70 - 120: 0 units CBG 121 - 150: 2 units CBG 151 - 200: 3 units CBG 201 - 250: 5 units CBG 251 - 300: 8 units CBG 301 - 350: 11 units CBG 351 - 400: 15 units CBG > 400 call MD. What changed:  additional instructions   insulin glargine 100 UNIT/ML injection Commonly known as:  LANTUS Inject 0.3 mLs (30 Units total) into the skin 2 (two) times daily.   ipratropium-albuterol 0.5-2.5 (3) MG/3ML Soln Commonly known as:  DUONEB Take 3 mLs by nebulization every 4 (four) hours as needed. Wheezing or dyspnea.   levothyroxine 88 MCG tablet Commonly known as:  SYNTHROID, LEVOTHROID Take 1 tablet (88 mcg total) by mouth daily before breakfast.   lidocaine-prilocaine cream Commonly known as:  EMLA Apply 1 application topically See admin instructions. Apply on Monday, Wednesday and Friday for pain prior to dialysis  The cream is 2.5 %   midodrine 10 MG tablet Commonly known as:  PROAMATINE Take 1 tablet (10 mg total) by mouth daily.   MULTIVITAMIN ADULT Chew Chew 1 tablet by mouth daily.   oxyCODONE-acetaminophen 5-325 MG tablet Commonly known as:  ROXICET Take 1 tablet by mouth every 4 (four) hours as needed for severe pain.   OXYGEN 2lpm 24/7- [facility]   pantoprazole 40 MG tablet Commonly known as:  PROTONIX Take 1 tablet (40 mg total) by mouth 2 (two) times daily before a meal.   polyethylene glycol packet Commonly known as:  MIRALAX / GLYCOLAX Take 17 g by mouth daily as needed for mild constipation.   senna 8.6 MG tablet Commonly known as:  SENOKOT Take 2 tablets (17.2 mg total) by mouth at bedtime as needed for constipation.   sevelamer carbonate 2.4 g Pack Commonly known as:  RENVELA Take 2.4 g by mouth 3 (three) times daily with meals.    traMADol 50 MG tablet Commonly known as:  ULTRAM Take 1 tablet (50 mg total) by mouth every 6 (six) hours as needed for moderate pain or severe pain.       Diagnostic Studies: No results found.  Disposition: 03-Skilled Nursing Facility  Discharge Instructions    Call MD / Call 911    Complete by:  As directed    If you experience chest pain or shortness of breath, CALL 911 and be transported to the hospital emergency room.  If you develope a fever above 101 F, pus (white drainage) or increased drainage or redness at the wound, or calf pain, call your surgeon's office.   Constipation Prevention    Complete by:  As directed    Drink plenty of fluids.  Prune juice may be helpful.  You may use a stool softener, such as Colace (over the counter) 100 mg twice a day.  Use MiraLax (over the counter) for constipation as needed.   Diet - low sodium heart healthy    Complete by:  As directed    Discharge patient    Complete by:  As directed    Discharge disposition:  03-Skilled Nursing Facility   Discharge patient date:  04/21/2017   Increase activity slowly as tolerated    Complete by:  As directed    Negative Pressure Wound Therapy - Incisional    Complete by:  As directed    Prevena portable wound VAC leave in place for  1 week then removed and start dry dressing changes       Contact information for follow-up providers    Adonis HugueninZamora, Erin R, NP Follow up in 1 week(s).   Specialty:  Orthopedic Surgery Contact information: [address] [place] [address] [phone]        Sherren KernsFields, Charles E, MD Follow up in 3 week(s).   Specialties:  Vascular Surgery, Cardiology Why:  Office will call you to arrange your appt (sent) Contact information: [address] [place] [address] [phone]        Drema HalonNewman, Christopher E, MD. Schedule an appointment as soon as possible for a visit in 6 month(s).   Specialty:  Otolaryngology Why:  For recheck of tubes. Call earlier if you have  any problems or questions. Contact information: [address] [place] [address] [phone]            Contact information for after-discharge care    Destination    [facility] .   Specialty:  Skilled Nursing Facility Contact information: [address] [place] [address] [phone]                   Signed: Kathryne HitchChristopher Y Blackman 04/21/2017, 8:00 AM

## 2017-04-21 NOTE — Clinical Social Work Note (Signed)
Clinical Social Worker facilitated patient discharge including contacting patient family and facility to confirm patient discharge plans.  Clinical information faxed to facility and family agreeable with plan.  CSW arranged ambulance transport via [facility] to [place].  RN to call report prior to discharge.  Clinical Social Worker will sign off for now as social work intervention is no longer needed. Please consult us again if new need arises.  Jesse Tj Kitchings, [name] [phone] 

## 2017-04-23 ENCOUNTER — Inpatient Hospital Stay (HOSPITAL_COMMUNITY): Payer: Medicare Other

## 2017-04-23 ENCOUNTER — Emergency Department (HOSPITAL_COMMUNITY): Payer: Medicare Other

## 2017-04-23 ENCOUNTER — Inpatient Hospital Stay (HOSPITAL_COMMUNITY)
Admission: EM | Admit: 2017-04-23 | Discharge: 2017-05-12 | DRG: 871 | Disposition: E | Payer: Medicare Other | Attending: Emergency Medicine | Admitting: Emergency Medicine

## 2017-04-23 DIAGNOSIS — G9340 Encephalopathy, unspecified: Secondary | ICD-10-CM | POA: Diagnosis not present

## 2017-04-23 DIAGNOSIS — E113599 Type 2 diabetes mellitus with proliferative diabetic retinopathy without macular edema, unspecified eye: Secondary | ICD-10-CM | POA: Diagnosis present

## 2017-04-23 DIAGNOSIS — R571 Hypovolemic shock: Secondary | ICD-10-CM | POA: Diagnosis present

## 2017-04-23 DIAGNOSIS — Z823 Family history of stroke: Secondary | ICD-10-CM

## 2017-04-23 DIAGNOSIS — R110 Nausea: Secondary | ICD-10-CM | POA: Diagnosis not present

## 2017-04-23 DIAGNOSIS — J189 Pneumonia, unspecified organism: Secondary | ICD-10-CM | POA: Diagnosis present

## 2017-04-23 DIAGNOSIS — J440 Chronic obstructive pulmonary disease with acute lower respiratory infection: Secondary | ICD-10-CM | POA: Diagnosis present

## 2017-04-23 DIAGNOSIS — F419 Anxiety disorder, unspecified: Secondary | ICD-10-CM | POA: Diagnosis present

## 2017-04-23 DIAGNOSIS — Z0189 Encounter for other specified special examinations: Secondary | ICD-10-CM

## 2017-04-23 DIAGNOSIS — Z87891 Personal history of nicotine dependence: Secondary | ICD-10-CM

## 2017-04-23 DIAGNOSIS — R579 Shock, unspecified: Secondary | ICD-10-CM

## 2017-04-23 DIAGNOSIS — I517 Cardiomegaly: Secondary | ICD-10-CM | POA: Diagnosis not present

## 2017-04-23 DIAGNOSIS — J9690 Respiratory failure, unspecified, unspecified whether with hypoxia or hypercapnia: Secondary | ICD-10-CM

## 2017-04-23 DIAGNOSIS — J449 Chronic obstructive pulmonary disease, unspecified: Secondary | ICD-10-CM | POA: Diagnosis not present

## 2017-04-23 DIAGNOSIS — J9621 Acute and chronic respiratory failure with hypoxia: Secondary | ICD-10-CM | POA: Diagnosis present

## 2017-04-23 DIAGNOSIS — N2581 Secondary hyperparathyroidism of renal origin: Secondary | ICD-10-CM | POA: Diagnosis present

## 2017-04-23 DIAGNOSIS — K219 Gastro-esophageal reflux disease without esophagitis: Secondary | ICD-10-CM | POA: Diagnosis present

## 2017-04-23 DIAGNOSIS — E1151 Type 2 diabetes mellitus with diabetic peripheral angiopathy without gangrene: Secondary | ICD-10-CM | POA: Diagnosis present

## 2017-04-23 DIAGNOSIS — I69351 Hemiplegia and hemiparesis following cerebral infarction affecting right dominant side: Secondary | ICD-10-CM

## 2017-04-23 DIAGNOSIS — Z515 Encounter for palliative care: Secondary | ICD-10-CM | POA: Diagnosis present

## 2017-04-23 DIAGNOSIS — E872 Acidosis: Secondary | ICD-10-CM | POA: Diagnosis present

## 2017-04-23 DIAGNOSIS — Z66 Do not resuscitate: Secondary | ICD-10-CM | POA: Diagnosis present

## 2017-04-23 DIAGNOSIS — E875 Hyperkalemia: Secondary | ICD-10-CM

## 2017-04-23 DIAGNOSIS — R402432 Glasgow coma scale score 3-8, at arrival to emergency department: Secondary | ICD-10-CM | POA: Diagnosis present

## 2017-04-23 DIAGNOSIS — E785 Hyperlipidemia, unspecified: Secondary | ICD-10-CM | POA: Diagnosis present

## 2017-04-23 DIAGNOSIS — I9589 Other hypotension: Secondary | ICD-10-CM | POA: Diagnosis present

## 2017-04-23 DIAGNOSIS — Z961 Presence of intraocular lens: Secondary | ICD-10-CM | POA: Diagnosis present

## 2017-04-23 DIAGNOSIS — Y95 Nosocomial condition: Secondary | ICD-10-CM | POA: Diagnosis present

## 2017-04-23 DIAGNOSIS — L03116 Cellulitis of left lower limb: Secondary | ICD-10-CM | POA: Diagnosis not present

## 2017-04-23 DIAGNOSIS — Z89612 Acquired absence of left leg above knee: Secondary | ICD-10-CM

## 2017-04-23 DIAGNOSIS — Z8249 Family history of ischemic heart disease and other diseases of the circulatory system: Secondary | ICD-10-CM

## 2017-04-23 DIAGNOSIS — Z794 Long term (current) use of insulin: Secondary | ICD-10-CM

## 2017-04-23 DIAGNOSIS — I509 Heart failure, unspecified: Secondary | ICD-10-CM | POA: Diagnosis not present

## 2017-04-23 DIAGNOSIS — A419 Sepsis, unspecified organism: Secondary | ICD-10-CM

## 2017-04-23 DIAGNOSIS — Z881 Allergy status to other antibiotic agents status: Secondary | ICD-10-CM

## 2017-04-23 DIAGNOSIS — R69 Illness, unspecified: Secondary | ICD-10-CM | POA: Diagnosis not present

## 2017-04-23 DIAGNOSIS — E1165 Type 2 diabetes mellitus with hyperglycemia: Secondary | ICD-10-CM | POA: Diagnosis present

## 2017-04-23 DIAGNOSIS — E1142 Type 2 diabetes mellitus with diabetic polyneuropathy: Secondary | ICD-10-CM | POA: Diagnosis present

## 2017-04-23 DIAGNOSIS — Z888 Allergy status to other drugs, medicaments and biological substances status: Secondary | ICD-10-CM

## 2017-04-23 DIAGNOSIS — F329 Major depressive disorder, single episode, unspecified: Secondary | ICD-10-CM | POA: Diagnosis present

## 2017-04-23 DIAGNOSIS — Z452 Encounter for adjustment and management of vascular access device: Secondary | ICD-10-CM

## 2017-04-23 DIAGNOSIS — E669 Obesity, unspecified: Secondary | ICD-10-CM | POA: Diagnosis present

## 2017-04-23 DIAGNOSIS — Z992 Dependence on renal dialysis: Secondary | ICD-10-CM

## 2017-04-23 DIAGNOSIS — Z22322 Carrier or suspected carrier of Methicillin resistant Staphylococcus aureus: Secondary | ICD-10-CM

## 2017-04-23 DIAGNOSIS — Z825 Family history of asthma and other chronic lower respiratory diseases: Secondary | ICD-10-CM

## 2017-04-23 DIAGNOSIS — I2721 Secondary pulmonary arterial hypertension: Secondary | ICD-10-CM | POA: Diagnosis present

## 2017-04-23 DIAGNOSIS — J9600 Acute respiratory failure, unspecified whether with hypoxia or hypercapnia: Secondary | ICD-10-CM

## 2017-04-23 DIAGNOSIS — I959 Hypotension, unspecified: Secondary | ICD-10-CM | POA: Diagnosis not present

## 2017-04-23 DIAGNOSIS — N186 End stage renal disease: Secondary | ICD-10-CM | POA: Diagnosis not present

## 2017-04-23 DIAGNOSIS — I5032 Chronic diastolic (congestive) heart failure: Secondary | ICD-10-CM | POA: Diagnosis present

## 2017-04-23 DIAGNOSIS — Z9622 Myringotomy tube(s) status: Secondary | ICD-10-CM | POA: Diagnosis present

## 2017-04-23 DIAGNOSIS — I447 Left bundle-branch block, unspecified: Secondary | ICD-10-CM | POA: Diagnosis present

## 2017-04-23 DIAGNOSIS — K449 Diaphragmatic hernia without obstruction or gangrene: Secondary | ICD-10-CM | POA: Diagnosis present

## 2017-04-23 DIAGNOSIS — G2581 Restless legs syndrome: Secondary | ICD-10-CM | POA: Diagnosis present

## 2017-04-23 DIAGNOSIS — Z6823 Body mass index (BMI) 23.0-23.9, adult: Secondary | ICD-10-CM

## 2017-04-23 DIAGNOSIS — L89152 Pressure ulcer of sacral region, stage 2: Secondary | ICD-10-CM | POA: Diagnosis present

## 2017-04-23 DIAGNOSIS — Z7951 Long term (current) use of inhaled steroids: Secondary | ICD-10-CM

## 2017-04-23 DIAGNOSIS — E871 Hypo-osmolality and hyponatremia: Secondary | ICD-10-CM | POA: Diagnosis present

## 2017-04-23 DIAGNOSIS — D631 Anemia in chronic kidney disease: Secondary | ICD-10-CM | POA: Diagnosis not present

## 2017-04-23 DIAGNOSIS — I132 Hypertensive heart and chronic kidney disease with heart failure and with stage 5 chronic kidney disease, or end stage renal disease: Secondary | ICD-10-CM | POA: Diagnosis present

## 2017-04-23 DIAGNOSIS — I480 Paroxysmal atrial fibrillation: Secondary | ICD-10-CM | POA: Diagnosis present

## 2017-04-23 DIAGNOSIS — E039 Hypothyroidism, unspecified: Secondary | ICD-10-CM | POA: Diagnosis present

## 2017-04-23 DIAGNOSIS — I2510 Atherosclerotic heart disease of native coronary artery without angina pectoris: Secondary | ICD-10-CM | POA: Diagnosis present

## 2017-04-23 DIAGNOSIS — E8809 Other disorders of plasma-protein metabolism, not elsewhere classified: Secondary | ICD-10-CM | POA: Diagnosis present

## 2017-04-23 DIAGNOSIS — R402441 Other coma, without documented Glasgow coma scale score, or with partial score reported, in the field [EMT or ambulance]: Secondary | ICD-10-CM | POA: Diagnosis not present

## 2017-04-23 DIAGNOSIS — D649 Anemia, unspecified: Secondary | ICD-10-CM | POA: Diagnosis present

## 2017-04-23 DIAGNOSIS — Z4659 Encounter for fitting and adjustment of other gastrointestinal appliance and device: Secondary | ICD-10-CM | POA: Diagnosis not present

## 2017-04-23 DIAGNOSIS — I252 Old myocardial infarction: Secondary | ICD-10-CM | POA: Diagnosis not present

## 2017-04-23 DIAGNOSIS — J9622 Acute and chronic respiratory failure with hypercapnia: Secondary | ICD-10-CM | POA: Diagnosis present

## 2017-04-23 DIAGNOSIS — Z9981 Dependence on supplemental oxygen: Secondary | ICD-10-CM

## 2017-04-23 DIAGNOSIS — R6521 Severe sepsis with septic shock: Secondary | ICD-10-CM | POA: Diagnosis present

## 2017-04-23 DIAGNOSIS — Z79891 Long term (current) use of opiate analgesic: Secondary | ICD-10-CM

## 2017-04-23 DIAGNOSIS — E1122 Type 2 diabetes mellitus with diabetic chronic kidney disease: Secondary | ICD-10-CM | POA: Diagnosis not present

## 2017-04-23 DIAGNOSIS — Z79899 Other long term (current) drug therapy: Secondary | ICD-10-CM

## 2017-04-23 DIAGNOSIS — Z833 Family history of diabetes mellitus: Secondary | ICD-10-CM

## 2017-04-23 DIAGNOSIS — Z4682 Encounter for fitting and adjustment of non-vascular catheter: Secondary | ICD-10-CM | POA: Diagnosis not present

## 2017-04-23 DIAGNOSIS — L97523 Non-pressure chronic ulcer of other part of left foot with necrosis of muscle: Secondary | ICD-10-CM | POA: Diagnosis not present

## 2017-04-23 DIAGNOSIS — Z7982 Long term (current) use of aspirin: Secondary | ICD-10-CM

## 2017-04-23 DIAGNOSIS — R001 Bradycardia, unspecified: Secondary | ICD-10-CM | POA: Diagnosis not present

## 2017-04-23 LAB — I-STAT ARTERIAL BLOOD GAS, ED
Acid-base deficit: 9 mmol/L — ABNORMAL HIGH (ref 0.0–2.0)
BICARBONATE: 20.6 mmol/L (ref 20.0–28.0)
O2 Saturation: 100 %
PO2 ART: 341 mmHg — AB (ref 83.0–108.0)
Patient temperature: 98.6
TCO2: 22 mmol/L (ref 0–100)
pCO2 arterial: 59.1 mmHg — ABNORMAL HIGH (ref 32.0–48.0)
pH, Arterial: 7.149 — CL (ref 7.350–7.450)

## 2017-04-23 LAB — LACTIC ACID, PLASMA: Lactic Acid, Venous: 2.7 mmol/L (ref 0.5–1.9)

## 2017-04-23 LAB — CBC WITH DIFFERENTIAL/PLATELET
Basophils Absolute: 0.1 10*3/uL (ref 0.0–0.1)
Basophils Relative: 1 %
EOS ABS: 0 10*3/uL (ref 0.0–0.7)
Eosinophils Relative: 0 %
HCT: 29.7 % — ABNORMAL LOW (ref 36.0–46.0)
HEMOGLOBIN: 8.9 g/dL — AB (ref 12.0–15.0)
LYMPHS ABS: 0.8 10*3/uL (ref 0.7–4.0)
Lymphocytes Relative: 6 %
MCH: 30 pg (ref 26.0–34.0)
MCHC: 30 g/dL (ref 30.0–36.0)
MCV: 100 fL (ref 78.0–100.0)
MONOS PCT: 8 %
Monocytes Absolute: 1.2 10*3/uL — ABNORMAL HIGH (ref 0.1–1.0)
NEUTROS PCT: 85 %
Neutro Abs: 11.8 10*3/uL — ABNORMAL HIGH (ref 1.7–7.7)
Platelets: 245 10*3/uL (ref 150–400)
RBC: 2.97 MIL/uL — AB (ref 3.87–5.11)
RDW: 17.1 % — ABNORMAL HIGH (ref 11.5–15.5)
WBC: 13.9 10*3/uL — ABNORMAL HIGH (ref 4.0–10.5)

## 2017-04-23 LAB — RENAL FUNCTION PANEL
ANION GAP: 22 — AB (ref 5–15)
Albumin: 2.4 g/dL — ABNORMAL LOW (ref 3.5–5.0)
BUN: 39 mg/dL — ABNORMAL HIGH (ref 6–20)
CO2: 12 mmol/L — AB (ref 22–32)
Calcium: 8.7 mg/dL — ABNORMAL LOW (ref 8.9–10.3)
Chloride: 99 mmol/L — ABNORMAL LOW (ref 101–111)
Creatinine, Ser: 6.64 mg/dL — ABNORMAL HIGH (ref 0.44–1.00)
GFR calc Af Amer: 7 mL/min — ABNORMAL LOW (ref >60)
GFR calc non Af Amer: 6 mL/min — ABNORMAL LOW (ref >60)
GLUCOSE: 408 mg/dL — AB (ref 65–99)
PHOSPHORUS: 7.5 mg/dL — AB (ref 2.5–4.6)
POTASSIUM: 6.1 mmol/L — AB (ref 3.5–5.1)
Sodium: 133 mmol/L — ABNORMAL LOW (ref 135–145)

## 2017-04-23 LAB — MAGNESIUM: MAGNESIUM: 2.3 mg/dL (ref 1.7–2.4)

## 2017-04-23 LAB — BASIC METABOLIC PANEL
ANION GAP: 20 — AB (ref 5–15)
BUN: 39 mg/dL — ABNORMAL HIGH (ref 6–20)
CHLORIDE: 97 mmol/L — AB (ref 101–111)
CO2: 16 mmol/L — AB (ref 22–32)
Calcium: 9.9 mg/dL (ref 8.9–10.3)
Creatinine, Ser: 6.78 mg/dL — ABNORMAL HIGH (ref 0.44–1.00)
GFR calc non Af Amer: 6 mL/min — ABNORMAL LOW (ref >60)
GFR, EST AFRICAN AMERICAN: 7 mL/min — AB (ref >60)
GLUCOSE: 398 mg/dL — AB (ref 65–99)
POTASSIUM: 6 mmol/L — AB (ref 3.5–5.1)
Sodium: 133 mmol/L — ABNORMAL LOW (ref 135–145)

## 2017-04-23 LAB — CBC
HCT: 28.9 % — ABNORMAL LOW (ref 36.0–46.0)
Hemoglobin: 9.1 g/dL — ABNORMAL LOW (ref 12.0–15.0)
MCH: 30.5 pg (ref 26.0–34.0)
MCHC: 31.5 g/dL (ref 30.0–36.0)
MCV: 97 fL (ref 78.0–100.0)
Platelets: 288 10*3/uL (ref 150–400)
RBC: 2.98 MIL/uL — ABNORMAL LOW (ref 3.87–5.11)
RDW: 17.4 % — ABNORMAL HIGH (ref 11.5–15.5)
WBC: 21.8 K/uL — ABNORMAL HIGH (ref 4.0–10.5)

## 2017-04-23 LAB — CBG MONITORING, ED
Glucose-Capillary: 278 mg/dL — ABNORMAL HIGH (ref 65–99)
Glucose-Capillary: 354 mg/dL — ABNORMAL HIGH (ref 65–99)

## 2017-04-23 LAB — CORTISOL: Cortisol, Plasma: 29.1 ug/dL

## 2017-04-23 LAB — COMPREHENSIVE METABOLIC PANEL
ALBUMIN: 2.4 g/dL — AB (ref 3.5–5.0)
ALK PHOS: 165 U/L — AB (ref 38–126)
AST: 22 U/L (ref 15–41)
Anion gap: 15 (ref 5–15)
BILIRUBIN TOTAL: 0.8 mg/dL (ref 0.3–1.2)
BUN: 40 mg/dL — AB (ref 6–20)
CALCIUM: 8.5 mg/dL — AB (ref 8.9–10.3)
CO2: 22 mmol/L (ref 22–32)
CREATININE: 7.16 mg/dL — AB (ref 0.44–1.00)
Chloride: 97 mmol/L — ABNORMAL LOW (ref 101–111)
GFR calc Af Amer: 6 mL/min — ABNORMAL LOW (ref >60)
GFR, EST NON AFRICAN AMERICAN: 5 mL/min — AB (ref >60)
GLUCOSE: 320 mg/dL — AB (ref 65–99)
Potassium: 6.2 mmol/L — ABNORMAL HIGH (ref 3.5–5.1)
Sodium: 134 mmol/L — ABNORMAL LOW (ref 135–145)
TOTAL PROTEIN: 6.9 g/dL (ref 6.5–8.1)

## 2017-04-23 LAB — TROPONIN I
Troponin I: 0.17 ng/mL (ref <0.03)
Troponin I: 0.84 ng/mL (ref <0.03)

## 2017-04-23 LAB — GLUCOSE, CAPILLARY
GLUCOSE-CAPILLARY: 325 mg/dL — AB (ref 65–99)
GLUCOSE-CAPILLARY: 398 mg/dL — AB (ref 65–99)
Glucose-Capillary: 431 mg/dL — ABNORMAL HIGH (ref 65–99)
Glucose-Capillary: 359 mg/dL — ABNORMAL HIGH (ref 65–99)
Glucose-Capillary: 358 mg/dL — ABNORMAL HIGH (ref 65–99)

## 2017-04-23 LAB — I-STAT TROPONIN, ED: Troponin i, poc: 0.01 ng/mL (ref 0.00–0.08)

## 2017-04-23 LAB — PROCALCITONIN: Procalcitonin: 1.69 ng/mL

## 2017-04-23 LAB — I-STAT CG4 LACTIC ACID, ED: LACTIC ACID, VENOUS: 3.08 mmol/L — AB (ref 0.5–1.9)

## 2017-04-23 LAB — PROTIME-INR
INR: 1.43
PROTHROMBIN TIME: 17.5 s — AB (ref 11.4–15.2)

## 2017-04-23 MED ORDER — NOREPINEPHRINE BITARTRATE 1 MG/ML IV SOLN
0.0000 ug/min | Freq: Once | INTRAVENOUS | Status: AC
  Administered 2017-04-23: 2 ug/min via INTRAVENOUS
  Filled 2017-04-23: qty 4

## 2017-04-23 MED ORDER — PRISMASOL BGK 4/2.5 32-4-2.5 MEQ/L IV SOLN
INTRAVENOUS | Status: DC
  Administered 2017-04-23 – 2017-04-25 (×13): via INTRAVENOUS_CENTRAL
  Filled 2017-04-23 (×23): qty 5000

## 2017-04-23 MED ORDER — SODIUM CHLORIDE 0.9 % IV BOLUS (SEPSIS)
1000.0000 mL | Freq: Once | INTRAVENOUS | Status: AC
Start: 2017-04-23 — End: 2017-04-23
  Administered 2017-04-23: 1000 mL via INTRAVENOUS

## 2017-04-23 MED ORDER — DARBEPOETIN ALFA 100 MCG/0.5ML IJ SOSY
100.0000 ug | PREFILLED_SYRINGE | INTRAMUSCULAR | Status: DC
  Filled 2017-04-23: qty 0.5

## 2017-04-23 MED ORDER — ARFORMOTEROL TARTRATE 15 MCG/2ML IN NEBU
15.0000 ug | INHALATION_SOLUTION | Freq: Two times a day (BID) | RESPIRATORY_TRACT | Status: DC
  Administered 2017-04-23 – 2017-04-24 (×2): 15 ug via RESPIRATORY_TRACT
  Filled 2017-04-23 (×5): qty 2

## 2017-04-23 MED ORDER — HEPARIN BOLUS VIA INFUSION (CRRT)
1000.0000 [IU] | INTRAVENOUS | Status: DC | PRN
  Administered 2017-04-23: 1000 [IU] via INTRAVENOUS_CENTRAL
  Filled 2017-04-23: qty 1000

## 2017-04-23 MED ORDER — PANTOPRAZOLE SODIUM 40 MG IV SOLR
40.0000 mg | Freq: Every day | INTRAVENOUS | Status: DC
  Administered 2017-04-23 – 2017-04-24 (×2): 40 mg via INTRAVENOUS
  Filled 2017-04-23 (×4): qty 40

## 2017-04-23 MED ORDER — PIPERACILLIN-TAZOBACTAM 3.375 G IVPB 30 MIN
3.3750 g | Freq: Four times a day (QID) | INTRAVENOUS | Status: DC
  Administered 2017-04-23 – 2017-04-25 (×8): 3.375 g via INTRAVENOUS
  Filled 2017-04-23 (×10): qty 50

## 2017-04-23 MED ORDER — CHLORHEXIDINE GLUCONATE 0.12% ORAL RINSE (MEDLINE KIT)
15.0000 mL | Freq: Two times a day (BID) | OROMUCOSAL | Status: DC
  Administered 2017-04-23 – 2017-04-24 (×4): 15 mL via OROMUCOSAL

## 2017-04-23 MED ORDER — BUDESONIDE 0.5 MG/2ML IN SUSP
0.5000 mg | Freq: Two times a day (BID) | RESPIRATORY_TRACT | Status: DC
  Administered 2017-04-23 – 2017-04-24 (×2): 0.5 mg via RESPIRATORY_TRACT
  Filled 2017-04-23 (×4): qty 2

## 2017-04-23 MED ORDER — INSULIN ASPART 100 UNIT/ML ~~LOC~~ SOLN
SUBCUTANEOUS | Status: AC
  Administered 2017-04-23: 5 [IU] via INTRAVENOUS
  Filled 2017-04-23: qty 1

## 2017-04-23 MED ORDER — VANCOMYCIN HCL IN DEXTROSE 1-5 GM/200ML-% IV SOLN
1000.0000 mg | Freq: Once | INTRAVENOUS | Status: DC
  Filled 2017-04-23: qty 200

## 2017-04-23 MED ORDER — NOREPINEPHRINE BITARTRATE 1 MG/ML IV SOLN
0.0000 ug/min | INTRAVENOUS | Status: DC
  Administered 2017-04-23: 18 ug/min via INTRAVENOUS
  Administered 2017-04-23: 8 ug/min via INTRAVENOUS
  Filled 2017-04-23 (×2): qty 4

## 2017-04-23 MED ORDER — MIDODRINE HCL 5 MG PO TABS: 10.0000 mg | ORAL_TABLET | ORAL | Status: DC

## 2017-04-23 MED ORDER — ONDANSETRON HCL 4 MG/2ML IJ SOLN
4.0000 mg | Freq: Once | INTRAMUSCULAR | Status: AC
  Administered 2017-04-23: 4 mg via INTRAVENOUS
  Filled 2017-04-23: qty 2

## 2017-04-23 MED ORDER — ALTEPLASE 2 MG IJ SOLR
2.0000 mg | Freq: Once | INTRAMUSCULAR | Status: DC | PRN
  Filled 2017-04-23: qty 2

## 2017-04-23 MED ORDER — SODIUM CHLORIDE 0.9 % IV BOLUS (SEPSIS)
1000.0000 mL | Freq: Once | INTRAVENOUS | Status: AC
  Administered 2017-04-23: 1000 mL via INTRAVENOUS

## 2017-04-23 MED ORDER — PRISMASOL BGK 0/2.5 32-2.5 MEQ/L IV SOLN
INTRAVENOUS | Status: DC
  Administered 2017-04-23 – 2017-04-25 (×3): via INTRAVENOUS_CENTRAL
  Filled 2017-04-23 (×4): qty 5000

## 2017-04-23 MED ORDER — PENTAFLUOROPROP-TETRAFLUOROETH EX AERO: 1.0000 "application " | INHALATION_SPRAY | CUTANEOUS | Status: DC | PRN

## 2017-04-23 MED ORDER — FENTANYL 2500MCG IN NS 250ML (10MCG/ML) PREMIX INFUSION
25.0000 ug/h | INTRAVENOUS | Status: DC
  Administered 2017-04-23: 50 ug/h via INTRAVENOUS
  Administered 2017-04-24: 300 ug/h via INTRAVENOUS
  Filled 2017-04-23 (×2): qty 250

## 2017-04-23 MED ORDER — HEPARIN SODIUM (PORCINE) 1000 UNIT/ML DIALYSIS
1000.0000 [IU] | INTRAMUSCULAR | Status: DC | PRN
  Administered 2017-04-23: 2400 [IU] via INTRAVENOUS_CENTRAL
  Filled 2017-04-23: qty 6
  Filled 2017-04-23: qty 3

## 2017-04-23 MED ORDER — LEVOTHYROXINE SODIUM 100 MCG IV SOLR
44.0000 ug | Freq: Every day | INTRAVENOUS | Status: DC
  Administered 2017-04-23 – 2017-04-25 (×3): 44 ug via INTRAVENOUS
  Filled 2017-04-23 (×3): qty 5

## 2017-04-23 MED ORDER — HEPARIN SODIUM (PORCINE) 5000 UNIT/ML IJ SOLN: 5000.0000 [IU] | Freq: Three times a day (TID) | INTRAMUSCULAR | Status: DC

## 2017-04-23 MED ORDER — BUDESONIDE 0.5 MG/2ML IN SUSP
0.5000 mg | Freq: Two times a day (BID) | RESPIRATORY_TRACT | Status: DC
  Filled 2017-04-23: qty 2

## 2017-04-23 MED ORDER — SODIUM CHLORIDE 0.9 % IV SOLN
INTRAVENOUS | Status: DC
  Administered 2017-04-23: 13:00:00 via INTRAVENOUS

## 2017-04-23 MED ORDER — IPRATROPIUM BROMIDE 0.02 % IN SOLN
0.5000 mg | Freq: Four times a day (QID) | RESPIRATORY_TRACT | Status: DC
  Administered 2017-04-23 – 2017-04-24 (×5): 0.5 mg via RESPIRATORY_TRACT
  Filled 2017-04-23 (×5): qty 2.5

## 2017-04-23 MED ORDER — INSULIN ASPART 100 UNIT/ML ~~LOC~~ SOLN: 0.0000 [IU] | SUBCUTANEOUS | Status: DC

## 2017-04-23 MED ORDER — INSULIN ASPART 100 UNIT/ML ~~LOC~~ SOLN
5.0000 [IU] | Freq: Once | SUBCUTANEOUS | Status: AC
  Administered 2017-04-23: 5 [IU] via INTRAVENOUS

## 2017-04-23 MED ORDER — SODIUM CHLORIDE 0.9 % IJ SOLN
250.0000 [IU]/h | INTRAMUSCULAR | Status: DC
  Administered 2017-04-23: 250 [IU]/h via INTRAVENOUS_CENTRAL
  Administered 2017-04-24 (×2): 950 [IU]/h via INTRAVENOUS_CENTRAL
  Administered 2017-04-25: 750 [IU]/h via INTRAVENOUS_CENTRAL
  Filled 2017-04-23 (×7): qty 2

## 2017-04-23 MED ORDER — SODIUM CHLORIDE 0.9 % IV SOLN: 250.0000 mL | INTRAVENOUS | Status: DC | PRN

## 2017-04-23 MED ORDER — MIDAZOLAM HCL 2 MG/2ML IJ SOLN
2.0000 mg | INTRAMUSCULAR | Status: DC | PRN
Start: 2017-04-23 — End: 2017-04-25
  Administered 2017-04-23 – 2017-04-24 (×2): 2 mg via INTRAVENOUS
  Filled 2017-04-23 (×2): qty 2

## 2017-04-23 MED ORDER — HEPARIN SODIUM (PORCINE) 1000 UNIT/ML DIALYSIS
6000.0000 [IU] | Freq: Once | INTRAMUSCULAR | Status: DC
  Filled 2017-04-23: qty 6

## 2017-04-23 MED ORDER — VANCOMYCIN HCL IN DEXTROSE 1-5 GM/200ML-% IV SOLN: 1000.0000 mg | INTRAVENOUS | Status: DC

## 2017-04-23 MED ORDER — ORAL CARE MOUTH RINSE
15.0000 mL | Freq: Four times a day (QID) | OROMUCOSAL | Status: DC
  Administered 2017-04-24 – 2017-04-25 (×8): 15 mL via OROMUCOSAL

## 2017-04-23 MED ORDER — SODIUM CHLORIDE 0.9 % IV SOLN
INTRAVENOUS | Status: DC
  Administered 2017-04-23: 3.7 [IU]/h via INTRAVENOUS
  Filled 2017-04-23 (×2): qty 1

## 2017-04-23 MED ORDER — FENTANYL CITRATE (PF) 100 MCG/2ML IJ SOLN: 50.0000 ug | Freq: Once | INTRAMUSCULAR | Status: DC

## 2017-04-23 MED ORDER — SODIUM CHLORIDE 0.9 % IV SOLN: 100.0000 mL | INTRAVENOUS | Status: DC | PRN

## 2017-04-23 MED ORDER — NOREPINEPHRINE BITARTRATE 1 MG/ML IV SOLN
0.0000 ug/min | INTRAVENOUS | Status: DC
  Administered 2017-04-23: 16 ug/min via INTRAVENOUS
  Administered 2017-04-25: 10 ug/min via INTRAVENOUS
  Filled 2017-04-23 (×2): qty 16

## 2017-04-23 MED ORDER — VANCOMYCIN HCL IN DEXTROSE 1-5 GM/200ML-% IV SOLN
1000.0000 mg | Freq: Once | INTRAVENOUS | Status: AC
  Administered 2017-04-23: 1000 mg via INTRAVENOUS
  Filled 2017-04-23: qty 200

## 2017-04-23 MED ORDER — DOXERCALCIFEROL 4 MCG/2ML IV SOLN
1.0000 ug | INTRAVENOUS | Status: DC
  Filled 2017-04-23 (×2): qty 2

## 2017-04-23 MED ORDER — SODIUM CHLORIDE 0.9 % IV SOLN
0.0000 ug/min | INTRAVENOUS | Status: DC
  Administered 2017-04-23: 20 ug/min via INTRAVENOUS
  Administered 2017-04-23: 300 ug/min via INTRAVENOUS
  Filled 2017-04-23 (×2): qty 1

## 2017-04-23 MED ORDER — ETOMIDATE 2 MG/ML IV SOLN
INTRAVENOUS | Status: DC | PRN
  Administered 2017-04-23: 20 mg via INTRAVENOUS

## 2017-04-23 MED ORDER — ARFORMOTEROL TARTRATE 15 MCG/2ML IN NEBU
15.0000 ug | INHALATION_SOLUTION | Freq: Two times a day (BID) | RESPIRATORY_TRACT | Status: DC
  Filled 2017-04-23: qty 2

## 2017-04-23 MED ORDER — HEPARIN (PORCINE) 2000 UNITS/L FOR CRRT
INTRAVENOUS_CENTRAL | Status: DC | PRN
  Administered 2017-04-23: 20:00:00 via INTRAVENOUS_CENTRAL
  Filled 2017-04-23: qty 1000

## 2017-04-23 MED ORDER — PIPERACILLIN-TAZOBACTAM 3.375 G IVPB
3.3750 g | Freq: Two times a day (BID) | INTRAVENOUS | Status: DC
  Filled 2017-04-23: qty 50

## 2017-04-23 MED ORDER — MIDAZOLAM HCL 2 MG/2ML IJ SOLN: 2.0000 mg | INTRAMUSCULAR | Status: DC | PRN

## 2017-04-23 MED ORDER — LIDOCAINE-PRILOCAINE 2.5-2.5 % EX CREA: 1.0000 "application " | TOPICAL_CREAM | CUTANEOUS | Status: DC | PRN

## 2017-04-23 MED ORDER — ASPIRIN 81 MG PO CHEW
81.0000 mg | CHEWABLE_TABLET | Freq: Every day | ORAL | Status: DC
  Administered 2017-04-23 – 2017-04-24 (×2): 81 mg
  Filled 2017-04-23 (×2): qty 1

## 2017-04-23 MED ORDER — VANCOMYCIN HCL IN DEXTROSE 1-5 GM/200ML-% IV SOLN
1000.0000 mg | Freq: Once | INTRAVENOUS | Status: DC
  Administered 2017-04-23: 1000 mg via INTRAVENOUS

## 2017-04-23 MED ORDER — ALBUTEROL SULFATE (2.5 MG/3ML) 0.083% IN NEBU: 2.5000 mg | INHALATION_SOLUTION | RESPIRATORY_TRACT | Status: DC | PRN

## 2017-04-23 MED ORDER — PRISMASOL BGK 0/2.5 32-2.5 MEQ/L IV SOLN
INTRAVENOUS | Status: DC
  Administered 2017-04-23 – 2017-04-25 (×6): via INTRAVENOUS_CENTRAL
  Filled 2017-04-23 (×8): qty 5000

## 2017-04-23 MED ORDER — CALCIUM CHLORIDE 10 % IV SOLN
INTRAVENOUS | Status: DC | PRN
  Administered 2017-04-23: 1 g via INTRAVENOUS

## 2017-04-23 MED ORDER — FENTANYL BOLUS VIA INFUSION
50.0000 ug | INTRAVENOUS | Status: DC | PRN
  Administered 2017-04-23: 50 ug via INTRAVENOUS
  Filled 2017-04-23: qty 50

## 2017-04-23 MED ORDER — LIDOCAINE HCL (PF) 1 % IJ SOLN: 5.0000 mL | INTRAMUSCULAR | Status: DC | PRN

## 2017-04-23 MED ORDER — HEPARIN SODIUM (PORCINE) 1000 UNIT/ML DIALYSIS: 1000.0000 [IU] | INTRAMUSCULAR | Status: DC | PRN

## 2017-04-23 MED ORDER — PIPERACILLIN-TAZOBACTAM 3.375 G IVPB 30 MIN
3.3750 g | Freq: Once | INTRAVENOUS | Status: AC
  Administered 2017-04-23: 3.375 g via INTRAVENOUS
  Filled 2017-04-23: qty 50

## 2017-04-23 MED ORDER — MIDODRINE HCL 5 MG PO TABS
10.0000 mg | ORAL_TABLET | ORAL | Status: DC
  Filled 2017-04-23: qty 2

## 2017-04-23 MED ORDER — INSULIN ASPART 100 UNIT/ML ~~LOC~~ SOLN
0.0000 [IU] | SUBCUTANEOUS | Status: DC
  Administered 2017-04-23: 11 [IU] via SUBCUTANEOUS

## 2017-04-23 MED ORDER — IPRATROPIUM BROMIDE 0.02 % IN SOLN
RESPIRATORY_TRACT | Status: AC
  Filled 2017-04-23: qty 2.5

## 2017-04-23 MED ORDER — ROCURONIUM BROMIDE 50 MG/5ML IV SOLN
INTRAVENOUS | Status: DC | PRN
  Administered 2017-04-23: 100 mg via INTRAVENOUS

## 2017-04-23 NOTE — Progress Notes (Signed)
[facility] Progress Note   Background: Kimberly [name] is a 61 Y/O patient with ESRD on hemodialysis MWF at [facility]. She was admitted 08/07-08/07/2017 for dehiscence L transtibial amputation. She had L AKA with wound vac placement 04/17/17 with myringotomy with bilateral tube placement per ENT for chronic serous otitis media. There was concern about possible infection of RUA AVG as she had a R Axilla boil that was positive for Providencia stuartii and Acintobacter.( She received ceftazidime X 6 doses at HD center). She was seen by [facility] while in hospital who found no evidence of infected AVG. She was DC'd back to SNF.  She presented to ED this AM with hypotension and altered mental status. VS on arrival to ED:  BP 68/27 T 99.5 R HR 106. Code Sepsis initiated, Labs and BC drawn. She has rec'd 700cc IV NS and is being started on Vanc/Fortaz. CXR shows mild vascular congestion. Labs pending.    Subjective: Patient awake, alert, calling me by name. Oriented X 2. C/O pain in buttocks (has stage 2 pressure ulcer sacrum). Says "I feel bad!".   Objective Vitals:   04-28-17 1017 04/28/17 1026 04-28-17 1035 04-28-2017 1047  BP:   (!) 93/44   Pulse: 63 60  100  Resp: (!) 26 (!) 26 19 15   Temp:      TempSrc:      SpO2: 100% 94%  94%   Physical Exam General: morbidly obese female in NAD Heart: HS distant. S1,S2. SR/ST on monitor. HR 94-110 Lungs: CTAB Slightly decreased in bases. O2 via nasal cannula. No WOB.  Abdomen: Active BS, non-distended, non-tender.  Extremities: L AKA with wound vac in place. No RLE edema.  Neuro: Alert, oriented X 2. Moves all extremities, following commands. PERRLA Dialysis Access: RUA AVG + Bruit   Additional Objective Labs: Basic Metabolic Panel:  Recent Labs Lab 04/17/17 0842 04/18/17 1409 04/20/17 1015  NA 135 133* 136  K 4.2 5.2* 5.3*  CL 100* 95* 97*  CO2  --  22 26  GLUCOSE 179* 203* 196*  BUN 20 30* 29*  CREATININE  4.10* 5.40* 5.36*  CALCIUM  --  8.9 9.1  PHOS  --  5.4* 5.5*   Liver Function Tests:  Recent Labs Lab 04/18/17 1409 04/20/17 1015  ALBUMIN 2.4* 2.5*   No results for input(s): LIPASE, AMYLASE in the last 168 hours. CBC:  Recent Labs Lab 04/18/17 1410 04/20/17 1015 04/28/2017 1029  WBC 11.1* 12.8* 13.9*  NEUTROABS  --   --  11.8*  HGB 8.4* 8.5* 8.9*  HCT 27.1* 28.3* 29.7*  MCV 98.2 99.3 100.0  PLT 248 259 245   Blood Culture    Component Value Date/Time   SDES BLOOD LEFT HAND 03/18/2017 0326   SPECREQUEST IN PEDIATRIC BOTTLE Blood Culture adequate volume 03/18/2017 0326   CULT NO GROWTH 5 DAYS 03/18/2017 0326   REPTSTATUS 03/23/2017 FINAL 03/18/2017 0326    Cardiac Enzymes: No results for input(s): CKTOTAL, CKMB, CKMBINDEX, TROPONINI in the last 168 hours. CBG:  Recent Labs Lab 04/20/17 0606 04/20/17 1147 04/20/17 2113 04/21/17 0646 04/21/17 1232  GLUCAP 155* 210* 137* 142* 171*   Iron Studies: No results for input(s): IRON, TIBC, TRANSFERRIN, FERRITIN in the last 72 hours. @lablastinr3 @ Studies/Results: Dg Chest Port 1 View  Result Date: 04-28-2017 CLINICAL DATA:  Code sepsis EXAM: PORTABLE CHEST 1 VIEW COMPARISON:  04/21/2017 FINDINGS: Mild cardiomegaly and vascular congestion, similar to prior study. Low lung volumes. No confluent airspace opacity  or effusion. No acute bony abnormality. IMPRESSION: Low lung volumes with cardiomegaly and vascular congestion. Electronically Signed   By: Charlett NoseKevin  Dover M.D.   On: 04/22/2017 10:09   Medications: . piperacillin-tazobactam 3.375 g (04/24/2017 1034)  . vancomycin 1,000 mg (04/24/2017 1047)     [facility], BFR 400, DFR 800, EDW 99 kg 2K/2.25Ca bath, UF profile 2, linear Na, AVG - Heparin 6000 initial + 5000 mid-run bolus - Mircera 100mcg q 2 weeks (last given 75mcg on 7/25) - Hectoral 1mcg IV q HD   Assessment/Plan: 1. Hypotension/possible sepsis: Source unknown, labs pending. Rec'd NS  fld bolus, has been started on Vanc/Zosyn.  2. ESRD -MWF. K 6.2 Will have HD today 2.0 K bath.  3. Anemia - HGB 8.9. Will give Aranesp 100 mcg IV today with HD.  4. Secondary hyperparathyroidism - Ca 8.5 Last phos 5.5 (04/20/17) Cont binders, VDRA.  5. HTN/volume - Takes midodrine 10 mg PO on HD days. Change to midodrine 10 mg PO 1 hour prior to HD and 10 mg PO mid treatment if needed. EDW recently lowered to 99 kg. No evidence of volume overload. Run even today.  6. Nutrition - Albumin 2.4. NPO at present. Renal/Carb Mod diet, prostat and renal vit when able to eat.  7. DM: per primary 8: H/O otitis media: S/P myringotomy tubes 04/17/17 9. H/O COPD. On home O2.   Rita H. Brown NP-C 04/24/2017, 11:01 AM  [facility] [phone]  I have seen and examined this patient and agree with plan per Alonna Bucklerita Brown.  61yo WF just Dc from hosp following Lt AKA on Sat and returns with confusion and hypotension.  While in ER, she resp arrested and now intubated with CXR showing pulm edema.  Currently on 2 pressors and K 6.2.  Spoke with CCM and they will place HD for CVVHD.  Kameelah Minish T,MD 04/17/2017 1:52 PM

## 2017-04-23 NOTE — Progress Notes (Signed)
eLink Physician-Brief Progress Note Patient Name: Kimberly ManilaSusan K Vivas DOB: [birth year] MRN: [id number]   Date of Service  04/27/2017  HPI/Events of Note  Hyperglycemia - Blood glucose now = 431.  eICU Interventions  Will order: 1. D/C Novolog SSI. 2. Insulin IV infusion per glucose stabilizer protocol.      Intervention Category Major Interventions: Hyperglycemia - active titration of insulin therapy  Lenell AntuSommer,Karan Ramnauth Eugene 04/24/2017, 7:46 PM

## 2017-04-23 NOTE — Progress Notes (Signed)
Pt transported on vent from ED to 2M08. Vitals remained stable throughout.

## 2017-04-23 NOTE — Progress Notes (Signed)
I have called pharmacy three times regarding the heparin syringe that I need to prime the CRRT machine. Still waiting will call again. Tamsen MeekLewis, Weldon Nouri E, RN 04/14/2017 6:59 PM

## 2017-04-23 NOTE — Code Documentation (Signed)
23 at the teeth  Positive color change with baggging

## 2017-04-23 NOTE — Progress Notes (Signed)
eLink Physician-Brief Progress Note Patient Name: Kimberly ManilaSusan K Ashworth DOB: 1956-07-13 MRN: [id number]   Date of Service  11-21-16  HPI/Events of Note  Blood glucose = 354 --> 398 --> 400. Currently on Q 4 hour sensitive Novolog SSI.  eICU Interventions  Will order: 1. Increase to Q 4 hour moderate Novolog SSI.      Intervention Category Major Interventions: Hyperglycemia - active titration of insulin therapy  Lenell AntuSommer,Daiel Strohecker Eugene 11-21-16, 4:58 PM

## 2017-04-23 NOTE — Code Documentation (Signed)
Pt unresponsive preparing for intubation. Assisting ventilations

## 2017-04-23 NOTE — ED Provider Notes (Signed)
[facility] DEPT Provider Note   CSN: [id number] Arrival date & time: 04/15/2017  0901     History   Chief Complaint Chief Complaint  Patient presents with  . Code Sepsis    HPI Kimberly [name] is a 61 y.o. female.  HPI   History limited by patient's mental status and acuity of condition Patient sleepy, oriented to place, answering questions on arrival Presents from [facility] via EMS for AMS beginning this morning.  Patient normally alert and oriented x4, but today has been calling out for help, found to have blood pressures 86/37. No known fevers.  Patient denies cough, urinary symptoms, abdominal pain, vomiting, diarrhea. Reports nausea and fatigue. States "help me"   Past Medical History:  Diagnosis Date  . Anemia   . Anxiety   . Arthritis    "knees, feet, hands" (08/23/2016)  . Asthma   . CAD, NATIVE VESSEL    May 10, 2010 cath showed a hyperdynamic LV function, she had dominant circumflex anatomy with a 70-80% small OM1. She had diffuse diabetic plaque particularly in the distal LAD. She nondominant RCA.  Nondominant  . Cellulitis 10/15/2013  . CHF (congestive heart failure) (HCC)    Preserved EF  . Chronic bronchitis ([place])    "probably once/ yr" (08/23/2016)  . Complication of anesthesia    "I've had difficulty waking up" (08/23/2016)  . COPD   . Depression   . Diabetic neuropathy ([place])   . Dyspnea   . Endometrial hyperplasia   . ESRD (end stage renal disease) on dialysis ([place])    "[facility]; MWF; [place]" (08/23/2016)  . Family history of adverse reaction to anesthesia    mother had hard time waking up  . GERD (gastroesophageal reflux disease)   . History of hiatal hernia   . HYPERLIPIDEMIA   . HYPERTENSION   . Hypothyroidism   . Myocardial infarct, old   . On home oxygen therapy    "2L; 24/7" (08/23/2016)  . Paralyzed vocal cords   . Peripheral neuropathy    hx/notes 01/27/2010  . Pneumonia "several times"  . Proliferative retinopathy      hx/notes 01/27/2010  . PVD    CEA  . Restless leg syndrome    "mostly on the right" (08/23/2016)  . 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 Type II diabetes mellitus [facility])     Patient Active Problem List   Diagnosis Date Noted  . Shock ([place]) 04/24/2017  . Encounter for central line placement   . Above knee amputation of left lower extremity ([place]) 04/17/2017  . Dehiscence of amputation stump ([place])   . Pneumonia 03/14/2017  . Hx of BKA, left ([place]) 02/27/2017  . Gangrene of left foot ([place])   . Congestive heart failure (CHF) ([place])   . Diabetes mellitus with complication ([place])   . Sepsis ([place]) 02/14/2017  . Diabetes mellitus, type II, insulin dependent ([place])   . HCAP (healthcare-associated pneumonia) 09/11/2016  . Pressure injury of skin 08/29/2016  . Chest pain 08/28/2016  . Elevated troponin 08/28/2016  . 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 Hyperkalemia 08/23/2016  . Shortness of breath 08/23/2016  . Hyponatremia 08/23/2016  . Hypotension arterial 09/30/2015  . LBBB (left bundle branch block) 09/30/2015  . Complex endometrial hyperplasia with atypia 09/02/2015  . Severe obesity ([place]) 07/29/2015  . Hypotension 07/20/2015  . Uncontrolled type 2  diabetes mellitus ([place]) 07/20/2015  . Vaginal bleeding   . Altered mental status   . Pulmonary arterial hypertension ([place])   . Paroxysmal atrial fibrillation ([place]) 07/11/2015  . End stage renal failure on dialysis ([place]) 07/08/2015  . Pressure ulcer 04/07/2015  . Obesity hypoventilation syndrome ([place]) 08/05/2014  . Morbid obesity ([place]) 06/28/2014  . Physical deconditioning 06/28/2014  . Hypothyroidism 06/10/2014  . Carotid stenosis 12/02/2013  . Varicose veins of lower extremities with other complications 28/31/5176  . Chronic respiratory failure with hypoxia ([place]) 10/13/2013  . On home oxygen therapy 10/13/2013  . Chronic  diastolic CHF (congestive heart failure) ([place]) 09/12/2013  . CAD (coronary artery disease), native coronary artery   . DM (diabetes mellitus), type 2 with renal complications ([place]) 16/03/3709  . Hyperlipidemia associated with type 2 diabetes mellitus ([place]) 05/31/2010  . COPD (chronic obstructive pulmonary disease) ([place]) 05/31/2010  . Hypertensive heart disease     Past Surgical History:  Procedure Laterality Date  . ABOVE KNEE LEG AMPUTATION Left 04/17/2017  . AMPUTATION Left 02/16/2017   Procedure: Left Below Knee Amputation;  Surgeon: Newt Minion, MD;  Location: [place];  Service: Orthopedics;  Laterality: Left;  . AMPUTATION Left 04/17/2017   Procedure: LEFT ABOVE KNEE AMPUTATION;  Surgeon: Newt Minion, MD;  Location: [place];  Service: Orthopedics;  Laterality: Left;  . APPLICATION OF WOUND VAC Left 04/17/2017   Procedure: APPLICATION OF WOUND VAC;  Surgeon: Newt Minion, MD;  Location: [place];  Service: Orthopedics;  Laterality: Left;  . AV FISTULA PLACEMENT Right 07/08/2015   Procedure: EXPLORATION RIGHT AXILLARY ARTERY AND RIGHT BRACHIAL VEIN;  Surgeon: Angelia Mould, MD;  Location: [place];  Service: Vascular;  Laterality: Right;  . AV FISTULA PLACEMENT Right 01/02/2017   Procedure: INSERTION OF ARTERIOVENOUS GORE-TEX  GRAFT  RIGHT ARM;  Surgeon: Angelia Mould, MD;  Location: [place];  Service: Vascular;  Laterality: Right;  . CAROTID ENDARTERECTOMY Left X 2  . CATARACT EXTRACTION W/ INTRAOCULAR LENS  IMPLANT, BILATERAL Bilateral   . CERVICAL BIOPSY  ~ 2015   "precancerous cells"  . COLONOSCOPY    . EYE SURGERY Bilateral    numerous surgeries  . INSERTION OF DIALYSIS CATHETER N/A 06/24/2015   Procedure: ULTRASOUND BILATERAL INTERNAL JUGULAR VEIN INSERTION OF DIALYSIS CATHETER LEFT INTERNAL JUGULAR VEIN ;  Surgeon: Mal Misty, MD;  Location: [place];  Service: Vascular;  Laterality: N/A;  . INTRAUTERINE DEVICE INSERTION  ~ 2015  . IR REMOVAL TUN CV CATH W/O FL   02/15/2017  . MYRINGOTOMY WITH TUBE PLACEMENT Bilateral 04/17/2017   Procedure: MYRINGOTOMY WITH TUBE PLACEMENT;  Surgeon: Rozetta Nunnery, MD;  Location: [place];  Service: ENT;  Laterality: Bilateral;  . THROMBECTOMY AND REVISION OF ARTERIOVENTOUS (AV) GORETEX  GRAFT Right 03/17/2017   Procedure: THROMBECTOMY AND REVISION OF ARM  ARTERIOVENTOUS GORETEX  GRAFT USING A 7MM BY 10CM GORTEX GRAFT ;  Surgeon: Elam Dutch, MD;  Location: [place];  Service: Vascular;  Laterality: Right;  . VITRECTOMY Bilateral     OB History    No data available       Home Medications    Prior to Admission medications   Medication Sig Start Date End Date Taking? Authorizing Provider  ALPRAZolam Duanne Moron) 0.5 MG tablet Take 0.5 mg by mouth every 8 (eight) hours as needed for anxiety.   Yes [provider]  aspirin EC 81 MG tablet Take 81 mg by mouth daily.   Yes  [provider]  budesonide (PULMICORT) 0.25 MG/2ML nebulizer solution Take 2 mLs (0.25 mg total) by nebulization 2 (two) times daily. 03/23/17  Yes Hongalgi, Lenis Dickinson, MD  docusate sodium (COLACE) 100 MG capsule Take 100 mg by mouth 2 (two) times daily.   Yes [provider]  fluticasone (FLONASE) 50 MCG/ACT nasal spray Place 2 sprays into both nostrils at bedtime.   Yes [provider]  guaiFENesin-dextromethorphan (ROBITUSSIN DM) 100-10 MG/5ML syrup Take 15 mLs by mouth every 6 (six) hours as needed for cough. 03/23/17  Yes Hongalgi, Lenis Dickinson, MD  hydrocortisone (ANUSOL-HC) 2.5 % rectal cream Place 1 application rectally as needed for hemorrhoids or itching.   Yes [provider]  insulin aspart (NOVOLOG) 100 UNIT/ML injection Inject 0-15 Units into the skin 3 (three) times daily with meals. CBG < 70: implement hypoglycemia protocol CBG 70 - 120: 0 units CBG 121 - 150: 2 units CBG 151 - 200: 3 units CBG 201 - 250: 5 units CBG 251 - 300: 8 units CBG 301 - 350: 11 units CBG 351 - 400: 15 units CBG > 400 call  MD. Patient taking differently: Inject 0-15 Units into the skin 3 (three) times daily with meals. CBG < 70-120: implement hypoglycemia protocol 121-150:2u 151-200:3u 201-250:5u 251-300:8u 301-350:11u 351-400:16u CBG > 400 call MD. 03/23/17  Yes Hongalgi, Lenis Dickinson, MD  insulin glargine (LANTUS) 100 UNIT/ML injection Inject 0.3 mLs (30 Units total) into the skin 2 (two) times daily. 03/23/17  Yes Hongalgi, Lenis Dickinson, MD  ipratropium-albuterol (DUONEB) 0.5-2.5 (3) MG/3ML SOLN Take 3 mLs by nebulization every 4 (four) hours as needed. Wheezing or dyspnea. 03/23/17  Yes Hongalgi, Lenis Dickinson, MD  levothyroxine (SYNTHROID, LEVOTHROID) 88 MCG tablet Take 1 tablet (88 mcg total) by mouth daily before breakfast. 10/10/16  Yes Ngetich, Dinah C, NP  lidocaine-prilocaine (EMLA) cream Apply 1 application topically See admin instructions. Apply on Monday, Wednesday and Friday for pain prior to dialysis  The cream is 2.5 %   Yes [provider]  midodrine (PROAMATINE) 10 MG tablet Take 1 tablet (10 mg total) by mouth daily. 02/20/17  Yes Elgergawy, Silver Huguenin, MD  Multiple Vitamins-Minerals (MULTIVITAMIN ADULT) TABS Chew 1 tablet by mouth daily.   Yes [provider]  oxyCODONE-acetaminophen (ROXICET) 5-325 MG tablet Take 1 tablet by mouth every 4 (four) hours as needed for severe pain. 04/20/17  Yes Newt Minion, MD  OXYGEN 2lpm 24/7- [facility]   Yes [provider]  pantoprazole (PROTONIX) 40 MG tablet Take 1 tablet (40 mg total) by mouth 2 (two) times daily before a meal. 03/23/17  Yes Hongalgi, Lenis Dickinson, MD  sevelamer carbonate (RENVELA) 2.4 g PACK Take 2.4 g by mouth 3 (three) times daily with meals. 08/31/16  Yes Theodis Blaze, MD  traMADol (ULTRAM) 50 MG tablet Take 1 tablet (50 mg total) by mouth every 6 (six) hours as needed for moderate pain or severe pain. 03/23/17  Yes Hongalgi, Lenis Dickinson, MD  acetaminophen (TYLENOL) 325 MG tablet Take 2 tablets (650 mg total) by mouth every 6 (six) hours as  needed for mild pain (or Fever >/= 101). 02/20/17   Elgergawy, Silver Huguenin, MD  albuterol (PROVENTIL HFA;VENTOLIN HFA) 108 (90 Base) MCG/ACT inhaler Inhale 2 puffs into the lungs every 6 (six) hours as needed for wheezing or shortness of breath.    [provider]  polyethylene glycol (MIRALAX / GLYCOLAX) packet Take 17 g by mouth daily as needed for mild constipation. 02/20/17  Elgergawy, Silver Huguenin, MD  senna (SENOKOT) 8.6 MG tablet Take 2 tablets (17.2 mg total) by mouth at bedtime as needed for constipation. 02/20/17   Elgergawy, Silver Huguenin, MD    Family History Family History  Problem Relation Age of Onset  . Cancer Mother        Breast, NHL  . Stroke Mother   . Peripheral vascular disease Father   . CAD Father 2  . Heart attack Father   . Hypertension Father   . Asthma Father   . Heart disease Father        before age 62    Social History Social History  Substance Use Topics  . Smoking status: Former Smoker    Packs/day: 1.00    Years: 20.00    Types: Cigarettes    Quit date: 09/11/2005  . Smokeless tobacco: Never Used  . Alcohol use No     Allergies   Ciprofloxacin and Epinephrine   Review of Systems Review of Systems  Constitutional: Positive for appetite change and fatigue. Negative for fever.  HENT: Negative for sore throat.   Eyes: Negative for visual disturbance.  Respiratory: Negative for cough and shortness of breath.   Cardiovascular: Negative for chest pain.  Gastrointestinal: Positive for nausea. Negative for abdominal pain, diarrhea and vomiting.  Genitourinary: Negative for difficulty urinating.  Skin: Positive for wound. Negative for rash.  Neurological: Negative for syncope and headaches.     Physical Exam Updated Vital Signs BP 102/63   Pulse 85   Temp 98.4 F (36.9 C) (Oral)   Resp (!) 23   Ht 5' 3"  (1.6 m)   Wt 98 kg (216 lb 0.8 oz)   SpO2 100%   BMI 38.27 kg/m   Physical Exam  Constitutional: She appears well-developed and  well-nourished. No distress.  HENT:  Head: Normocephalic and atraumatic.  Eyes: Conjunctivae and EOM are normal.  Neck: Normal range of motion.  Cardiovascular: Regular rhythm, normal heart sounds and intact distal pulses.  Tachycardia present.  Exam reveals no gallop and no friction rub.   No murmur heard. Pulmonary/Chest: Effort normal and breath sounds normal. No respiratory distress. She has no wheezes. She has no rales.  Abdominal: Soft. She exhibits no distension. There is no tenderness. There is no guarding.  Musculoskeletal: She exhibits no edema or tenderness.  Left AKA wound vac in place  Neurological: She is alert.  Oriented to self, "doctor's office", reports year 2015  Skin: Skin is warm and dry. No rash noted. She is not diaphoretic. No erythema.  Nursing note and vitals reviewed.    ED Treatments / Results  Labs (all labs ordered are listed, but only abnormal results are displayed) Labs Reviewed  COMPREHENSIVE METABOLIC PANEL - Abnormal; Notable for the following:       Result Value   Sodium 134 (*)    Potassium 6.2 (*)    Chloride 97 (*)    Glucose, Bld 320 (*)    BUN 40 (*)    Creatinine, Ser 7.16 (*)    Calcium 8.5 (*)    Albumin 2.4 (*)    ALT <5 (*)    Alkaline Phosphatase 165 (*)    GFR calc non Af Amer 5 (*)    GFR calc Af Amer 6 (*)    All other components within normal limits  CBC WITH DIFFERENTIAL/PLATELET - Abnormal; Notable for the following:    WBC 13.9 (*)    RBC 2.97 (*)    Hemoglobin  8.9 (*)    HCT 29.7 (*)    RDW 17.1 (*)    Neutro Abs 11.8 (*)    Monocytes Absolute 1.2 (*)    All other components within normal limits  PROTIME-INR - Abnormal; Notable for the following:    Prothrombin Time 17.5 (*)    All other components within normal limits  BASIC METABOLIC PANEL - Abnormal; Notable for the following:    Sodium 133 (*)    Potassium 6.0 (*)    Chloride 97 (*)    CO2 16 (*)    Glucose, Bld 398 (*)    BUN 39 (*)    Creatinine, Ser  6.78 (*)    GFR calc non Af Amer 6 (*)    GFR calc Af Amer 7 (*)    Anion gap 20 (*)    All other components within normal limits  TROPONIN I - Abnormal; Notable for the following:    Troponin I 0.17 (*)    All other components within normal limits  TROPONIN I - Abnormal; Notable for the following:    Troponin I 0.84 (*)    All other components within normal limits  RENAL FUNCTION PANEL - Abnormal; Notable for the following:    Sodium 133 (*)    Potassium 6.1 (*)    Chloride 99 (*)    CO2 12 (*)    Glucose, Bld 408 (*)    BUN 39 (*)    Creatinine, Ser 6.64 (*)    Calcium 8.7 (*)    Phosphorus 7.5 (*)    Albumin 2.4 (*)    GFR calc non Af Amer 6 (*)    GFR calc Af Amer 7 (*)    Anion gap 22 (*)    All other components within normal limits  LACTIC ACID, PLASMA - Abnormal; Notable for the following:    Lactic Acid, Venous 2.7 (*)    All other components within normal limits  GLUCOSE, CAPILLARY - Abnormal; Notable for the following:    Glucose-Capillary 398 (*)    All other components within normal limits  CBC - Abnormal; Notable for the following:    WBC 21.8 (*)    RBC 2.98 (*)    Hemoglobin 9.1 (*)    HCT 28.9 (*)    RDW 17.4 (*)    All other components within normal limits  GLUCOSE, CAPILLARY - Abnormal; Notable for the following:    Glucose-Capillary 431 (*)    All other components within normal limits  GLUCOSE, CAPILLARY - Abnormal; Notable for the following:    Glucose-Capillary 359 (*)    All other components within normal limits  GLUCOSE, CAPILLARY - Abnormal; Notable for the following:    Glucose-Capillary 358 (*)    All other components within normal limits  GLUCOSE, CAPILLARY - Abnormal; Notable for the following:    Glucose-Capillary 325 (*)    All other components within normal limits  I-STAT CG4 LACTIC ACID, ED - Abnormal; Notable for the following:    Lactic Acid, Venous 3.08 (*)    All other components within normal limits  CBG MONITORING, ED -  Abnormal; Notable for the following:    Glucose-Capillary 278 (*)    All other components within normal limits  CBG MONITORING, ED - Abnormal; Notable for the following:    Glucose-Capillary 354 (*)    All other components within normal limits  I-STAT ARTERIAL BLOOD GAS, ED - Abnormal; Notable for the following:    pH, Arterial 7.149 (*)  pCO2 arterial 59.1 (*)    pO2, Arterial 341.0 (*)    Acid-base deficit 9.0 (*)    All other components within normal limits  CULTURE, BLOOD (ROUTINE X 2)  CULTURE, BLOOD (ROUTINE X 2)  URINE CULTURE  CULTURE, RESPIRATORY (NON-EXPECTORATED)  CORTISOL  MAGNESIUM  PROCALCITONIN  URINALYSIS, ROUTINE W REFLEX MICROSCOPIC  TROPONIN I  CBC  BASIC METABOLIC PANEL  BLOOD GAS, ARTERIAL  MAGNESIUM  PHOSPHORUS  RENAL FUNCTION PANEL  APTT  PROCALCITONIN  RENAL FUNCTION PANEL  I-STAT TROPONIN, ED  I-STAT CG4 LACTIC ACID, ED    EKG  EKG Interpretation  Date/Time:  Monday April 23 2017 09:10:31 EDT Ventricular Rate:  100 PR Interval:    QRS Duration: 130 QT Interval:  386 QTC Calculation: 498 R Axis:   77 Text Interpretation:  Sinus tachycardia IVCD, consider atypical LBBB Baseline wander in lead(s) III No significant change since last tracing Confirmed by Gareth Morgan ([id number]) on 05/10/2017 10:12:44 AM       Radiology Dg Chest Port 1 View  Result Date: 04/22/2017 CLINICAL DATA:  Central line placement. EXAM: PORTABLE CHEST 1 VIEW COMPARISON:  Film earlier today at 1130 hours FINDINGS: Non tunneled dialysis catheter has been placed via the right internal jugular vein with the catheter tip in the upper SVC. No pneumothorax after line placement. Degree of pulmonary edema appears improved since the prior study. Endotracheal tube remains with the tip 3.5 cm above the carina. Gastric decompression tube extends below the diaphragm. IMPRESSION: Non tunneled dialysis catheter tip in upper SVC without pneumothorax. There is diminishment in pulmonary  edema since the prior chest x-ray. Electronically Signed   By: Aletta Edouard M.D.   On: 05/09/2017 16:40   Dg Chest Portable 1 View  Result Date: 05/06/2017 CLINICAL DATA:  Intubation . EXAM: PORTABLE CHEST 1 VIEW COMPARISON:  04/15/2017. FINDINGS: Endotracheal tube noted with its tip 3.1 cm above the carina. NG tube noted with tip below left hemidiaphragm. Side-hole is above the gastroesophageal junction. Advancement of the catheter approximately 15 cm should be considered. Cardiomegaly. Diffuse bilateral pulmonary infiltrates consistent pulmonary edema noted. Bilateral pneumonia cannot be excluded. Stable elevation right hemidiaphragm. IMPRESSION: 1. Endotracheal tube noted with its tip 3.1 cm above the carina. NG tube noted with tip below left hemidiaphragm. Side-hole is above the gastroesophageal junction. Advancement of the NG tube approximately 15 cm should be considered. 2. Congestive heart failure with bilateral pulmonary edema. Changes of congestive heart failure have progressed from prior exam. Electronically Signed   By: [place]   On: 04/22/2017 12:01   Dg Chest Port 1 View  Result Date: 04/30/2017 CLINICAL DATA:  Code sepsis EXAM: PORTABLE CHEST 1 VIEW COMPARISON:  04/21/2017 FINDINGS: Mild cardiomegaly and vascular congestion, similar to prior study. Low lung volumes. No confluent airspace opacity or effusion. No acute bony abnormality. IMPRESSION: Low lung volumes with cardiomegaly and vascular congestion. Electronically Signed   By: Rolm Baptise M.D.   On: 04/13/2017 10:09   Dg Abd Portable 1v  Result Date: 05/06/2017 CLINICAL DATA:  OG tube placement EXAM: PORTABLE ABDOMEN - 1 VIEW COMPARISON:  Earlier the same day FINDINGS: NG tube tip is in the proximal to mid stomach. The tube could be advanced 7 or 8 cm put the tip in the distal portion of the stomach. Proximal port of the tube is below the expected location of GE junction. Stomach is decompressed. No gaseous bowel  dilatation. IMPRESSION: NG tube tip is in the proximal to mid stomach  and could be advanced slightly as warranted. The proximal port of the tube is below the expected location of the EG junction. Electronically Signed   By: Misty Stanley M.D.   On: 04/14/2017 19:02   Dg Abd Portable 1v  Result Date: 05/05/2017 CLINICAL DATA:  Orogastric tube positioning. EXAM: PORTABLE ABDOMEN - 1 VIEW COMPARISON:  Film earlier today at 1206 hours FINDINGS: The orogastric tube has been advanced with redundant catheter now coiled in the stomach. The tip of the catheter lies in the region of the distal antrum. Gas seen previously in the stomach has now been completely decompressed. IMPRESSION: Orogastric tube now coiled in the stomach with tip in the region of the distal antrum. The stomach is now decompressed. Electronically Signed   By: Aletta Edouard M.D.   On: 04/11/2017 16:43   Dg Abd Portable 1v  Result Date: 04/22/2017 CLINICAL DATA:  Status post orogastric tube placement. EXAM: PORTABLE ABDOMEN - 1 VIEW COMPARISON:  None in PACs FINDINGS: The tip of the orogastric tube lies in the region of the gastric cardia with the proximal port at or just above the GE junction. There is moderate gaseous distention of the stomach. IMPRESSION: Orogastric tube positioned as noted above. Advancement by approximately 10-15 cm is recommended. Electronically Signed   By: David  Martinique M.D.   On: 05/09/2017 12:16    Procedures Procedure Name: Intubation Date/Time: 04/20/2017 11:45 PM Performed by: Gareth Morgan Pre-anesthesia Checklist: Patient identified Oxygen Delivery Method: Ambu bag Preoxygenation: Pre-oxygenation with 100% oxygen Induction Type: IV induction and Rapid sequence Ventilation: Mask ventilation without difficulty and Oral airway inserted - appropriate to patient size Laryngoscope Size: Glidescope and 3 Grade View: Grade I Tube size: 7.5 mm Number of attempts: 1 Placement Confirmation: ETT inserted  through vocal cords under direct vision,  Breath sounds checked- equal and bilateral and Positive ETCO2 Tube secured with: ETT holder      (including critical care time)  CRITICAL CARE:shock, hyperkalemia Performed by: Alvino Chapel   Total critical care time: 60 minutes  Critical care time was exclusive of separately billable procedures and treating other patients.  Critical care was necessary to treat or prevent imminent or life-threatening deterioration.  Critical care was time spent personally by me on the following activities: development of treatment plan with patient and/or surrogate as well as nursing, discussions with consultants, evaluation of patient's response to treatment, examination of patient, obtaining history from patient or surrogate, ordering and performing treatments and interventions, ordering and review of laboratory studies, ordering and review of radiographic studies, pulse oximetry and re-evaluation of patient's condition.   Medications Ordered in ED Medications  etomidate (AMIDATE) injection (20 mg Intravenous Given 04/18/2017 1125)  rocuronium (ZEMURON) injection (100 mg Intravenous Given 05/06/2017 1126)  calcium chloride injection (1 g Intravenous Given 04/22/2017 1136)  Darbepoetin Alfa (ARANESP) injection 100 mcg (not administered)  doxercalciferol (HECTOROL) injection 1 mcg (not administered)  pentafluoroprop-tetrafluoroeth (GEBAUERS) aerosol 1 application (not administered)  lidocaine (PF) (XYLOCAINE) 1 % injection 5 mL (not administered)  lidocaine-prilocaine (EMLA) cream 1 application (not administered)  0.9 %  sodium chloride infusion (not administered)  0.9 %  sodium chloride infusion (not administered)  heparin injection 1,000 Units (not administered)  alteplase (CATHFLO ACTIVASE) injection 2 mg (not administered)  heparin injection 6,000 Units (6,000 Units Dialysis Not Given 04/26/2017 1730)  phenylephrine (NEO-SYNEPHRINE) 10 mg in sodium  chloride 0.9 % 250 mL (0.04 mg/mL) infusion (0 mcg/min Intravenous Stopped 04/22/2017 1719)  0.9 %  sodium  chloride infusion ( Intravenous Rate/Dose Verify 04/24/2017 2300)  pantoprazole (PROTONIX) injection 40 mg (40 mg Intravenous Given 04/27/2017 2152)  0.9 %  sodium chloride infusion (not administered)  midodrine (PROAMATINE) tablet 10 mg (not administered)  fentaNYL (SUBLIMAZE) injection 50 mcg (50 mcg Intravenous Not Given 05/09/2017 1300)  fentaNYL 2558mg in NS 2534m(1079mml) infusion-PREMIX (275 mcg/hr Intravenous Rate/Dose Change 04/29/2017 2316)  fentaNYL (SUBLIMAZE) bolus via infusion 50 mcg (50 mcg Intravenous Bolus from Bag 04/17/2017 1819)  midazolam (VERSED) injection 2 mg (not administered)  midazolam (VERSED) injection 2 mg (2 mg Intravenous Given 04/16/2017 1539)  aspirin chewable tablet 81 mg (81 mg Per Tube Given 05/05/2017 1759)  albuterol (PROVENTIL) (2.5 MG/3ML) 0.083% nebulizer solution 2.5 mg (not administered)  ipratropium (ATROVENT) nebulizer solution 0.5 mg (0.5 mg Nebulization Given 04/27/2017 2004)  levothyroxine (SYNTHROID, LEVOTHROID) injection 44 mcg (44 mcg Intravenous Given 04/17/2017 1726)  heparin injection 1,000-6,000 Units (2,400 Units CRRT Given 04/30/2017 1607)  heparin 10,000 units/ 20 mL infusion syringe (550 Units/hr CRRT Rate/Dose Change 04/15/2017 2316)  heparin bolus via infusion syringe 1,000 Units (1,000 Units CRRT Bolus from Bag 04/13/2017 2156)  heparinized saline (2000 units/L) primer fluid for CRRT ( CRRT New Bag/Given 04/30/2017 2016)  prismasol BGK 0/2.5 5,000 mL dialysis replacement fluid ( CRRT New Bag/Given 04/22/2017 2006)  prismasol BGK 0/2.5 5,000 mL dialysis replacement fluid ( CRRT New Bag/Given 04/22/2017 2007)  chlorhexidine gluconate (MEDLINE KIT) (PERIDEX) 0.12 % solution 15 mL (15 mLs Mouth Rinse Given 05/11/2017 2046)  MEDLINE mouth rinse (not administered)  prismasol BGK 4/2.5 5,000 mL dialysis solution ( CRRT New Bag/Given 04/17/2017 2006)  arformoterol (BROVANA) nebulizer  solution 15 mcg (15 mcg Nebulization Given 04/16/2017 2003)  budesonide (PULMICORT) nebulizer solution 0.5 mg (0.5 mg Nebulization Given 04/18/2017 2003)  piperacillin-tazobactam (ZOSYN) IVPB 3.375 g (0 g Intravenous Stopped 05/09/2017 2336)  vancomycin (VANCOCIN) IVPB 1000 mg/200 mL premix (1,000 mg Intravenous Not Given 05/05/2017 1800)  vancomycin (VANCOCIN) IVPB 1000 mg/200 mL premix (not administered)  norepinephrine (LEVOPHED) 16 mg in dextrose 5 % 250 mL (0.064 mg/mL) infusion (18 mcg/min Intravenous Rate/Dose Change 04/12/2017 2338)  insulin regular (NOVOLIN R,HUMULIN R) 100 Units in sodium chloride 0.9 % 100 mL (1 Units/mL) infusion (10.6 Units/hr Intravenous Rate/Dose Change 05/05/2017 2336)  sodium chloride 0.9 % bolus 1,000 mL (0 mLs Intravenous Stopped 04/28/2017 0959)  piperacillin-tazobactam (ZOSYN) IVPB 3.375 g (0 g Intravenous Stopped 04/12/2017 1104)  vancomycin (VANCOCIN) IVPB 1000 mg/200 mL premix (0 mg Intravenous Stopped 05/02/2017 1147)  ondansetron (ZOFRAN) injection 4 mg (4 mg Intravenous Given 04/22/2017 1011)  norepinephrine (LEVOPHED) 4 mg in dextrose 5 % 250 mL (0.016 mg/mL) infusion (8 mcg/min Intravenous Rate/Dose Change 04/11/2017 1455)  insulin aspart (novoLOG) injection 5 Units (5 Units Intravenous Given 05/02/2017 1142)  sodium chloride 0.9 % bolus 1,000 mL (1,000 mLs Intravenous New Bag/Given 05/11/2017 1235)  sodium chloride 0.9 % bolus 1,000 mL (1,000 mLs Intravenous New Bag/Given 05/05/2017 1234)  ipratropium (ATROVENT) 0.02 % nebulizer solution (  Duplicate 04/24/26/0632376   Initial Impression / Assessment and Plan / ED Course  I have reviewed the triage vital signs and the nursing notes.  Pertinent labs & imaging results that were available during my care of the patient were reviewed by me and considered in my medical decision making (see chart for details).     [id number] with history of recent left above the knee amputation 8/7 with wound vac in place following dehiscence of transtibial  amputation, ESRDon dialysis MWF,CAD,  COPD, paroxysmal atrial fibrillation, recent pneumonia 7/4, hypothyroidism, CVA, presents with concern for altered mental status from her facility.  On arrival, blood pressure 04H systolic, with initial concern for possible etiology including sepsis but unclear source by history.  Doubt meningitis given no meningeal signs, no headache.  Other possible etiologies of hypotension included dehydration on arrival. Patient given empiric vanc/zosyn on arrival, 1L of NS initially with improvement of blood pressures to the 364B systolic.  While in the emergency department I was called back in the room for unresponsiveness. Patient GCS 4, blood pressures 50s/30s. Suspect hypotension secondary to sepsis given patient only transient response with fluids, and feel encephalopathy likely secondary to sepsis and hypotension.  Patient given additional fluids, started on levophed, BVM provided and she was optimized for intubation for airway protection which was performed with glidescope without complications, ETT placement confirmed on CXR. Given peripheral levophed, phenylephrine and fluids and critical care consulted who will place central access.  Labs show hyperkalemia and repeat ECG shows loss of p waves concerning for hyperkalemic changes, given calcium and insulin. Nephrology involved. Orthopedics consult page placed however pending discussion. Pt admitted to critical care.   Final Clinical Impressions(s) / ED Diagnoses   Final diagnoses:  Encounter for imaging study to confirm orogastric (OG) tube placement  Septic shock ([place])  Encephalopathy  ESRD on dialysis [facility])  Hyperkalemia    New Prescriptions Current Discharge Medication List       Gareth Morgan, MD 04/24/17 [phone]

## 2017-04-23 NOTE — ED Triage Notes (Signed)
[facility] Pt had surgery 3 weeks ago to amputate left lower leg. Pt was found to be altered this morning and only calling out for help pt has low blood pressure with EMS 86/37.

## 2017-04-23 NOTE — Code Documentation (Signed)
Oral air way in place bagging currently

## 2017-04-23 NOTE — H&P (Addendum)
PULMONARY / CRITICAL CARE MEDICINE   Name: Kimberly [name] MRN: [id number] DOB: 03-15-1956    ADMISSION DATE:  05-15-17 CONSULTATION DATE:  May 15, 2017  REFERRING MD:  Dr. Dalene Seltzer  CHIEF COMPLAINT:  Hypotension  HISTORY OF PRESENT ILLNESS:  HPI obtained from medical chart review as patient is intubated and sedated.  61 year old female with PMH of ESRD (HD MWF), COPD on home ?2L O2 (last seen by Dr. Sherene Sires on 07/2015), HTN, DM, GERD, CAD, CVA s/p right hemiparesis, PVD, otitis media s/p myringotomy tubes 8/7, chronic hypotension (on midodrine on HD days) who presented 8/13 with altered mental status and hypotension.   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  Additionally, there was concern over infected AVG after having a right axilla boil that was positive for Providencia stuartii ad Acintobacter.  She was evaluated by vascular surgery with no evidence of infected AVG and discharged back to SNF.  Of note, patient was a prior DNR.   Presented from [facility] facility after being found altered this morning, calling out for help and found to be hypotensive with EMS as 86/37.  On 8/11, patient had complained of cough and wheezing, with her "asthma acting up" at her Nephrology appointment.   On arrival to ER, SBP 68, temp 99.5, HR 106. Labs noted for WBC 13.9, lactate 3.08, troponin 0.01, K 6.2, Hgb 8.9, Code sepsis initiated.  She was given NS bolus and started on vancomycin and fortaz.  CXR with bilateral pulmonary inflitrates/edema.  In the ER, she became unresponsive requiring intubation.  Placed on phenylephrine and norepinephrine for continued hypotension.  PCCM to admit, nephrology consulting.   PAST MEDICAL HISTORY :  She  has a past medical history of Anemia; Anxiety; Arthritis; Asthma; CAD, NATIVE VESSEL; Cellulitis (10/15/2013); CHF (congestive heart failure) (HCC); Chronic  bronchitis (HCC); Complication of anesthesia; COPD; Depression; Diabetic neuropathy (HCC); Dyspnea; Endometrial hyperplasia; ESRD (end stage renal disease) on dialysis (HCC); Family history of adverse reaction to anesthesia; GERD (gastroesophageal reflux disease); History of hiatal hernia; HYPERLIPIDEMIA; HYPERTENSION; Hypothyroidism; Myocardial infarct, old; On home oxygen therapy; Paralyzed vocal cords; Peripheral neuropathy; Pneumonia ("several times"); Proliferative retinopathy; PVD; Restless leg syndrome; Stroke [facility]); and Type II diabetes mellitus (HCC).  PAST SURGICAL HISTORY: She  has a past surgical history that includes Carotid endarterectomy (Left, X 2); Vitrectomy (Bilateral); Cataract extraction w/ intraocular lens  implant, bilateral (Bilateral); Colonoscopy; Insertion of dialysis catheter (N/A, 06/24/2015); AV fistula placement (Right, 07/08/2015); Eye surgery (Bilateral); Cervical biopsy (~ 2015); Intrauterine device insertion (~ 2015); AV fistula placement (Right, 01/02/2017); IR Removal Tun Cv Cath W/O FL (02/15/2017); Amputation (Left, 02/16/2017); Thrombectomy and revision of arterioventous (av) goretex  graft (Right, 03/17/2017); Amputation (Left, 04/17/2017); Application if wound vac (Left, 04/17/2017); Myringotomy with tube placement (Bilateral, 04/17/2017); and Above knee leg amputaton (Left, 04/17/2017).  Allergies  Allergen Reactions  . Ciprofloxacin Itching  . Epinephrine Palpitations    No current facility-administered medications on file prior to encounter.    Current Outpatient Prescriptions on File Prior to Encounter  Medication Sig  . aspirin EC 81 MG tablet Take 81 mg by mouth daily.  Marland Kitchen docusate sodium (COLACE) 100 MG capsule Take 100 mg by mouth 2 (two) times daily.  . hydrocortisone (ANUSOL-HC) 2.5 % rectal cream Place 1 application rectally as needed for hemorrhoids or itching.  Marland Kitchen ipratropium-albuterol (DUONEB) 0.5-2.5 (3) MG/3ML SOLN Take 3 mLs by nebulization every 4 (  four)  hours as needed. Wheezing or dyspnea.  Marland Kitchen acetaminophen (TYLENOL) 325 MG tablet Take 2 tablets (650 mg total) by mouth every 6 (six) hours as needed for mild pain (or Fever >/= 101).  Marland Kitchen albuterol (PROVENTIL HFA;VENTOLIN HFA) 108 (90 Base) MCG/ACT inhaler Inhale 2 puffs into the lungs every 6 (six) hours as needed for wheezing or shortness of breath.  . ALPRAZolam (XANAX) 0.5 MG tablet Take 0.5 mg by mouth every 8 (eight) hours as needed for anxiety.  . budesonide (PULMICORT) 0.25 MG/2ML nebulizer solution Take 2 mLs (0.25 mg total) by nebulization 2 (two) times daily.  . fluticasone (FLONASE) 50 MCG/ACT nasal spray Place 2 sprays into both nostrils at bedtime.  Marland Kitchen guaiFENesin-dextromethorphan (ROBITUSSIN DM) 100-10 MG/5ML syrup Take 15 mLs by mouth every 6 (six) hours as needed for cough.  . insulin aspart (NOVOLOG) 100 UNIT/ML injection Inject 0-15 Units into the skin 3 (three) times daily with meals. CBG < 70: implement hypoglycemia protocol CBG 70 - 120: 0 units CBG 121 - 150: 2 units CBG 151 - 200: 3 units CBG 201 - 250: 5 units CBG 251 - 300: 8 units CBG 301 - 350: 11 units CBG 351 - 400: 15 units CBG > 400 call MD. (Patient taking differently: Inject 0-15 Units into the skin 3 (three) times daily with meals. CBG < 70: implement hypoglycemia protocol CBG 0 - 150: 0 units CBG 151 - 200: 2 units CBG 201 - 250: 4 units CBG 251 - 300: 6 units CBG 301 - 350: 8 units CBG 351 - 400: 10 units CBG > 400 call MD.)  . insulin glargine (LANTUS) 100 UNIT/ML injection Inject 0.3 mLs (30 Units total) into the skin 2 (two) times daily.  Marland Kitchen levothyroxine (SYNTHROID, LEVOTHROID) 88 MCG tablet Take 1 tablet (88 mcg total) by mouth daily before breakfast.  . lidocaine-prilocaine (EMLA) cream Apply 1 application topically See admin instructions. Apply on Monday, Wednesday and Friday for pain prior to dialysis  The cream is 2.5 %  . midodrine (PROAMATINE) 10 MG tablet Take 1 tablet (10 mg total) by mouth  daily.  . Multiple Vitamins-Minerals (MULTIVITAMIN ADULT) CHEW Chew 1 tablet by mouth daily.  Marland Kitchen oxyCODONE-acetaminophen (ROXICET) 5-325 MG tablet Take 1 tablet by mouth every 4 (four) hours as needed for severe pain.  . OXYGEN 2lpm 24/7- [facility]  . pantoprazole (PROTONIX) 40 MG tablet Take 1 tablet (40 mg total) by mouth 2 (two) times daily before a meal.  . polyethylene glycol (MIRALAX / GLYCOLAX) packet Take 17 g by mouth daily as needed for mild constipation.  . senna (SENOKOT) 8.6 MG tablet Take 2 tablets (17.2 mg total) by mouth at bedtime as needed for constipation.  . sevelamer carbonate (RENVELA) 2.4 g PACK Take 2.4 g by mouth 3 (three) times daily with meals.  . traMADol (ULTRAM) 50 MG tablet Take 1 tablet (50 mg total) by mouth every 6 (six) hours as needed for moderate pain or severe pain.    FAMILY HISTORY:  Her indicated that her mother is deceased. She indicated that her father is deceased.    SOCIAL HISTORY: She  reports that she quit smoking about 11 years ago. Her smoking use included Cigarettes. She has a 20.00 pack-year smoking history. She has never used smokeless tobacco. She reports that she does not drink alcohol or use drugs.  REVIEW OF SYSTEMS:  Unable to obtain as patient is intubated and sedated.   SUBJECTIVE:  Levophed at 10 mcg/min Neosynephrine at  160 mcg/min  VITAL SIGNS: BP (!) 118/43   Pulse (!) 111   Temp 99.5 F (37.5 C) (Rectal)   Resp (!) 28   Ht 5\' 3"  (1.6 m)   SpO2 97%   HEMODYNAMICS:    VENTILATOR SETTINGS: Vent Mode: PRVC FiO2 (%):  [50 %-100 %] 50 % Set Rate:  [20 bmp-28 bmp] 28 bmp Vt Set:  [420 mL] 420 mL PEEP:  [5 cmH20] 5 cmH20 Plateau Pressure:  [28 cmH20-29 cmH20] 28 cmH20  INTAKE / OUTPUT: No intake/output data recorded.  PHYSICAL EXAMINATION: General:  Obese adult female lying on ER stretcher, MV in NAD HEENT: MM pink/moist, ETT/OGT, pupils= /reactive Neuro: sedated, does not follow commands CV: s1s2 rrr, no m/r/g PULM:  even/non-labored on MV, lungs bilaterally rales GI: obese, soft, non-tender, bs active  Extremities: warm/dry, no edema, RUA AVG, Left AKA Skin: no rashes or lesions   LABS:  BMET  Recent Labs Lab 04/18/17 1409 04/20/17 1015 05/23/2017 1029  NA 133* 136 134*  K 5.2* 5.3* 6.2*  CL 95* 97* 97*  CO2 22 26 22   BUN 30* 29* 40*  CREATININE 5.40* 5.36* 7.16*  GLUCOSE 203* 196* 320*    Electrolytes  Recent Labs Lab 04/18/17 1409 04/20/17 1015 23-May-2017 1029  CALCIUM 8.9 9.1 8.5*  PHOS 5.4* 5.5*  --     CBC  Recent Labs Lab 04/18/17 1410 04/20/17 1015 05-23-2017 1029  WBC 11.1* 12.8* 13.9*  HGB 8.4* 8.5* 8.9*  HCT 27.1* 28.3* 29.7*  PLT 248 259 245    Coag's  Recent Labs Lab May 23, 2017 1029  INR 1.43    Sepsis Markers  Recent Labs Lab 05-23-17 1042  LATICACIDVEN 3.08*    ABG  Recent Labs Lab 2017-05-23 1256  PHART 7.149*  PCO2ART 59.1*  PO2ART 341.0*    Liver Enzymes  Recent Labs Lab 04/18/17 1409 04/20/17 1015 05/23/2017 1029  AST  --   --  22  ALT  --   --  <5*  ALKPHOS  --   --  165*  BILITOT  --   --  0.8  ALBUMIN 2.4* 2.5* 2.4*    Cardiac Enzymes No results for input(s): TROPONINI, PROBNP in the last 168 hours.  Glucose  Recent Labs Lab 04/20/17 1147 04/20/17 2113 04/21/17 0646 04/21/17 1232 2017-05-23 1104 05/23/17 1255  GLUCAP 210* 137* 142* 171* 278* 354*    Imaging Dg Chest Portable 1 View  Result Date: May 23, 2017 CLINICAL DATA:  Intubation . EXAM: PORTABLE CHEST 1 VIEW COMPARISON:  05-23-2017. FINDINGS: Endotracheal tube noted with its tip 3.1 cm above the carina. NG tube noted with tip below left hemidiaphragm. Side-hole is above the gastroesophageal junction. Advancement of the catheter approximately 15 cm should be considered. Cardiomegaly. Diffuse bilateral pulmonary infiltrates consistent pulmonary edema noted. Bilateral pneumonia cannot be excluded. Stable elevation right hemidiaphragm. IMPRESSION: 1. Endotracheal  tube noted with its tip 3.1 cm above the carina. NG tube noted with tip below left hemidiaphragm. Side-hole is above the gastroesophageal junction. Advancement of the NG tube approximately 15 cm should be considered. 2. Congestive heart failure with bilateral pulmonary edema. Changes of congestive heart failure have progressed from prior exam. Electronically Signed   By: Maisie Fus  Register   On: 05/23/17 12:01   Dg Chest Port 1 View  Result Date: 23-May-2017 CLINICAL DATA:  Code sepsis EXAM: PORTABLE CHEST 1 VIEW COMPARISON:  04/21/2017 FINDINGS: Mild cardiomegaly and vascular congestion, similar to prior study. Low lung volumes. No confluent airspace opacity or effusion.  No acute bony abnormality. IMPRESSION: Low lung volumes with cardiomegaly and vascular congestion. Electronically Signed   By: Charlett Nose M.D.   On: 05-18-17 10:09   Dg Abd Portable 1v  Result Date: 05/18/2017 CLINICAL DATA:  Status post orogastric tube placement. EXAM: PORTABLE ABDOMEN - 1 VIEW COMPARISON:  None in PACs FINDINGS: The tip of the orogastric tube lies in the region of the gastric cardia with the proximal port at or just above the GE junction. There is moderate gaseous distention of the stomach. IMPRESSION: Orogastric tube positioned as noted above. Advancement by approximately 10-15 cm is recommended. Electronically Signed   By: David  [place] M.D.   On: 05-18-2017 12:16   STUDIES:  CXR 8/13 >> low lung volumes w/cardiomegaly and vascular congestion CXR 8/13 post intubation >> ETT 3.1 cm above carina, NG tip below left hemidiaphragm w/side hole above gastroesophageal junction with suggested advancement by 15 cm; CHF w/bilateral pulmonary edema which have progressed from prior exam; stable elevation of right hemidiaphragm ABD 1 view 8/13 >> OGT in region of gastric cardia, suggested advancement recommended by 10-15 cm   CULTURES: 8/13 BC >> 8/13 UC >> 8/13 Sputum >>  ANTIBIOTICS: 8/13 vancomycin >> 8/13 zosyn  >>  SIGNIFICANT EVENTS: 8/13 Admit  LINES/TUBES: PIV x 2 ETT 8/13 >> OGT 8/13 >>  DISCUSSION: 48 yoF with PMH ESRD on MWF HD, chronic hypotension, COPD on home O2, CVA, DM, PVD s/p recent L AKA on 8/7 with wound vac presented with altered mental status and hypotension who required intubation for respiratory distress.   ASSESSMENT / PLAN:  PULMONARY A: Acute on chronic hypoxic/hypercarbic respiratory failure Pulmonary edema +/- HCAP Hx COPD on home O2, 2L  P:   PRVC 8 cc/kg ABG noted, rate adjusted Wean FiO2/PEEP for sats > 90% Trend CXR/ ABG VAP protocol brovana and pulmicort nebs BID Albuterol/atrovent prn  See ID/Renal below Daily SBT when stable  CARDIOVASCULAR A:  LBBB- noted on previous EKG 03/24/2017 ectopy Shock- septic vs cardiogenic PVD s/p L AKA w/wound vac 8/7 Hx CAD, HTN, recently with chronic hypotension on midodrine on days of HD P:  ICU monitoring Trend EKG /troponins Assess TTE Levophed and Neo for map >55 No escalation of care Assess stat BMET/ Mag Midodrine 10mg  per tube MWF  RENAL A:   ESRD- MWF with RUE AVG AGMA/ lactic acidosis  P:   Will place triaylsis catheter for initiation of CRRT Stat BMET/ Mag now, then trend  Appreciate Renal assistance   GASTROINTESTINAL A:   GERD NPO Poor nutrition, albumin 2.4 P:   Advance OGT NPO Protonix for SUP Consider starting TF 8/14  HEMATOLOGIC A:   Chronic Anemia P:  Trend CBC Transfuse for Hgb > 7 Continue aranesp per Renal Heparin per CRRT  INFECTIOUS A:   R/o HCAP Right wound vac to L AKA- appears to have normal drainage P:   Follow cultures Trend PCT Continue empiric vanc and zosyn, narrow as able Tend WBC/ monitor fever curve Notified Dr. Audrie Lia office, they will have someone evaluate as he unavailable this week  ENDOCRINE A:   Hyperglycemia  Hx DM  P:   CBG q 4 SSI sensitive  NEUROLOGIC A:   Acute encephalopathy- ?related to hypotension/ r/o sepsis Hx CVA  with residual right hemiparesis P:   RASS goal: -1 PAD protocol with Fentanyl and versed prn  Daily WUA   FAMILY  - Updates: Husband called and updated on patient's condition.  He confirmed that patient was a  DNR prior to this and most likely she would not want any of these measures, however he is not comfortable making the decision right now to withdraw care.  Will continue current therapies, including placing a temporary HD catheter for CRRT, with no further augmentation of care.  Code status updated to DNR.   - Inter-disciplinary family meet or Palliative Care meeting due by:  04/30/2017  CCT 60 mins  Posey BoyerBrooke [name], AGACNP-BC [facility] Pgr: [phone] or if no answer [phone] 2017/02/26, 2:40 PM  Attending Note:  I have examined patient, reviewed labs, studies and notes. I have discussed the case with B [name], and I agree with the data and plans as amended above.   61 year old woman with a comp complicated history that includes end-stage renal disease, COPD, hypertension, diabetes, hemiparesis from a prior CVA. She was recently discharged from the hospital after undergoing a revision left AKA and wound VAC placement due to left transtibial amputation dehiscence. During that hospitalization she also underwent myringotomy tubes for possible otitis media, was treated for possible AV graft infection with a skin/boil culture that was positive for Providencia stuartii and Acinetobacter. She was discharged to skilled nursing facility, returned to the emergency department on 8/13 with progressive somnolence. Found to be hypotensive, initially responsive to IV fluids, but then persistent. Her mental status worsened in conjunction with blood pressure. She required intubation for urgent airway protection and support, pressors were initiated. A review of her chart revealed that goals of care has been discussed prior to her previous discharge given her overall debilitated state. She  had indicated that she would not want resuscitation of this kind. On my evaluation she is sedated, unresponsive, on mechanical ventilation. She is currently on norepinephrine and phenylephrine for blood pressure support. Obese woman with healed abrasions on her upper extremities and chest. Endotracheal tube in good position. Pupils equal and reactive. Lungs have coarse breath sounds bilaterally. Heart is tachycardic without a murmur. Abdomen is soft, obese, with positive bowel sounds. Her chest x-ray has a chronically elevated right hemidiaphragm with progressive bilateral alveolar and interstitial infiltrates. Suspect that her encephalopathy is due to combined septic and hypovolemic shock. Volume status certainly impacted by her wound VAC. Likely septic sources include her left lower extremity wound, recent myringotomy tubes, AV graft, possible healthcare associated pneumonia. Discussions were undertaken with the patient's husband who confirmed that Ms. Arif is DO NOT RESUSCITATE, likely would not want aggressive and invasive interventions under the circumstances. All the same he felt it would be consistent with her wishes to continue what has already been initiated, achieve stability and see if she can rebound quickly. If so then work to try and stabilize her and de-escalate care to get her back to situation in which she can rehab. We will not escalate her care from here. Continue broad-spectrum anabiotic's, current degree of mechanical ventilator and vasopressor support. We will place a temporary dialysis catheter to facilitate continuous hemodialysis. Believe we will need to actively involve her husband and family to discuss withdrawal of care if she does not rebound quickly with current level of support. We will let Dr Lajoyce Cornersuda know that she is admitted - will need Ortho to take down the wound vac to assess the leg as possible septic source.    Independent critical care time is 40 minutes.   Levy Pupaobert Karson Reede, MD,  PhD 2017/02/26, 3:13 PM [facility] [phone] or if no answer [phone]

## 2017-04-23 NOTE — ED Notes (Signed)
Zoll pads placed on pt.

## 2017-04-23 NOTE — Progress Notes (Signed)
Pharmacy Antibiotic Note  Kimberly [name] is a [age] y.o. female admitted on 04/26/2017 with sepsis.  Pharmacy has been consulted for Zosyn and vancomcyin dosing. Patient is ESRD with HD on Monday, Wednesday, Friday. WBC 13.9 Patient already received 1000 mg vancomycin but due to critical state will order an additional gram for when patient gets IV access. Patient has been on HD, but may be transitioned to CRRT. Please adjust doses as appropriate.   Plan: Vancomycin 1000mg  IV in ED then one gram with each HD session.  Goal trough 15-20 mcg/mL. Zosyn 3.375g IV q12h (4 hour infusion). F/U dialysis plan  Height: 5\' 3"  (160 cm) IBW/kg (Calculated) : 52.4  Temp (24hrs), Avg:99.5 F (37.5 C), Min:99.5 F (37.5 C), Max:99.5 F (37.5 C)   Recent Labs Lab 04/17/17 0842 04/18/17 1409 04/18/17 1410 04/20/17 1015 04/22/2017 1029 04/22/2017 1042  WBC  --   --  11.1* 12.8* 13.9*  --   CREATININE 4.10* 5.40*  --  5.36* 7.16*  --   LATICACIDVEN  --   --   --   --   --  3.08*    Estimated Creatinine Clearance: 9.1 mL/min (A) (by C-G formula based on SCr of 7.16 mg/dL (H)).    Allergies  Allergen Reactions  . Ciprofloxacin Itching  . Epinephrine Palpitations   Antimicrobials this admission: 8/13 Vancomycin>> 8/13 Zosyn  Dose adjustments this admission:  Microbiology results:  Thank you for allowing pharmacy to be a part of this patient's care.  Toniann Failony L Lindell Renfrew 04/26/2017 1:47 PM

## 2017-04-23 NOTE — Progress Notes (Signed)
Pharmacy Antibiotic Note  Kimberly ManilaSusan K [name] is a 61 y.o. female admitted on 04/24/2017 with sepsis.  Pharmacy has been consulted for Zosyn and vancomcyin dosing. Patient is ESRD with HD on Monday, Wednesday, Friday, however she is starting CRRT tonight.  Zosyn 3.375g IV x1 and vancomycin 1g IV x1 each given ~1000 this morning in the ED.  Plan: Vancomycin 1g IV x1 now to complete a 20mg /kg load, then start vancomycin 1g (10mg /kg) IV q24h 8/14 at 1800 Zosyn 3.375g IV q6h starting at 2200 (30min infusion for CRRT) F/U CRRT tolerance, clinical progression, LOT  Height: 5\' 3"  (160 cm) IBW/kg (Calculated) : 52.4  Temp (24hrs), Avg:99.3 F (37.4 C), Min:99.1 F (37.3 C), Max:99.5 F (37.5 C)   Recent Labs Lab 04/18/17 1409 04/18/17 1410 04/20/17 1015 04/15/2017 1029 04/13/2017 1042 05/06/2017 1322 05/01/2017 1638  WBC  --  11.1* 12.8* 13.9*  --   --   --   CREATININE 5.40*  --  5.36* 7.16*  --  6.78* 6.64*  LATICACIDVEN  --   --   --   --  3.08*  --   --     Estimated Creatinine Clearance: 9.8 mL/min (A) (by C-G formula based on SCr of 6.64 mg/dL (H)).    Allergies  Allergen Reactions  . Ciprofloxacin Itching  . Epinephrine Palpitations   Antimicrobials this admission: 8/13 Vancomycin>> 8/13 Zosyn  Dose adjustments this admission: 8/13 PM: vanc adjusted to 10mg /kg q24h and Zosyn adjusted to 3.375g IV q6h for CRRT dosing  Microbiology results: 8/13 BCx: sent  Thank you for allowing pharmacy to be a part of this patient's care.  Shabrea Weldin D. Boleslaw Borghi, PharmD, BCPS Clinical Pharmacist Pager: [phone] 04/24/2017 5:52 PM

## 2017-04-23 NOTE — ED Notes (Signed)
Attempted Report 

## 2017-04-23 NOTE — Progress Notes (Signed)
Patient ID: Kimberly [name], female   DOB: 1955-11-08, 61 y.o.   MRN: [id number] I removed the incisional VAC from her left AKA stump.  The suture line is intact with no redness and minimal drainage.  A new dry dressing can be placed.  Will continue to follow closely.

## 2017-04-23 NOTE — Procedures (Signed)
Central Venous Hemodialysis Catheter Insertion Procedure Note Galen ManilaSusan K Niebla [id number] [birth year]  Procedure: Insertion of Central Venous Catheter Indications: Drug and/or fluid administration and Hemodialysis- CRRT  Procedure Details Consent: Risks of procedure as well as the alternatives and risks of each were explained to the (patient/caregiver).  Consent for procedure obtained. Time Out: Verified patient identification, verified procedure, site/side was marked, verified correct patient position, special equipment/implants available, medications/allergies/relevent history reviewed, required imaging and test results available.  Performed  Maximum sterile technique was used including antiseptics, cap, gloves, gown, hand hygiene, mask and sheet. Skin prep: Chlorhexidine; local anesthetic administered A antimicrobial bonded/coated triple lumen trialysis catheter was placed in the right internal jugular vein using the Seldinger technique to 15 cm.  Line sutured.  Biopatch placed and sterile dressing applied.   Evaluation Blood flow good Complications: No apparent complications Patient did tolerate procedure well. Chest X-ray ordered to verify placement.  CXR: pending.  Procedure performed with ultrasound guidance for real time vessel cannulation.     Posey BoyerBrooke Simpson, AGACNP-BC [facility] Pgr: [phone] or if no answer [phone] 2017/05/11, 4:11 PM

## 2017-04-24 ENCOUNTER — Inpatient Hospital Stay (HOSPITAL_COMMUNITY): Payer: Medicare Other

## 2017-04-24 ENCOUNTER — Other Ambulatory Visit (HOSPITAL_COMMUNITY): Payer: Medicare Other

## 2017-04-24 DIAGNOSIS — Z4659 Encounter for fitting and adjustment of other gastrointestinal appliance and device: Secondary | ICD-10-CM

## 2017-04-24 DIAGNOSIS — G9340 Encephalopathy, unspecified: Secondary | ICD-10-CM

## 2017-04-24 DIAGNOSIS — Z0189 Encounter for other specified special examinations: Secondary | ICD-10-CM

## 2017-04-24 DIAGNOSIS — Z452 Encounter for adjustment and management of vascular access device: Secondary | ICD-10-CM

## 2017-04-24 LAB — RENAL FUNCTION PANEL
ALBUMIN: 2.4 g/dL — AB (ref 3.5–5.0)
Anion gap: 11 (ref 5–15)
BUN: 26 mg/dL — ABNORMAL HIGH (ref 6–20)
CHLORIDE: 101 mmol/L (ref 101–111)
CO2: 22 mmol/L (ref 22–32)
Calcium: 8.7 mg/dL — ABNORMAL LOW (ref 8.9–10.3)
Creatinine, Ser: 4.25 mg/dL — ABNORMAL HIGH (ref 0.44–1.00)
GFR, EST AFRICAN AMERICAN: 12 mL/min — AB (ref >60)
GFR, EST NON AFRICAN AMERICAN: 10 mL/min — AB (ref >60)
Glucose, Bld: 173 mg/dL — ABNORMAL HIGH (ref 65–99)
PHOSPHORUS: 3.3 mg/dL (ref 2.5–4.6)
POTASSIUM: 5.7 mmol/L — AB (ref 3.5–5.1)
Sodium: 134 mmol/L — ABNORMAL LOW (ref 135–145)

## 2017-04-24 LAB — PHOSPHORUS: Phosphorus: 3.3 mg/dL (ref 2.5–4.6)

## 2017-04-24 LAB — GLUCOSE, CAPILLARY
GLUCOSE-CAPILLARY: 239 mg/dL — AB (ref 65–99)
GLUCOSE-CAPILLARY: 214 mg/dL — AB (ref 65–99)
GLUCOSE-CAPILLARY: 157 mg/dL — AB (ref 65–99)
GLUCOSE-CAPILLARY: 121 mg/dL — AB (ref 65–99)
GLUCOSE-CAPILLARY: 163 mg/dL — AB (ref 65–99)
Glucose-Capillary: 113 mg/dL — ABNORMAL HIGH (ref 65–99)
Glucose-Capillary: 117 mg/dL — ABNORMAL HIGH (ref 65–99)
Glucose-Capillary: 117 mg/dL — ABNORMAL HIGH (ref 65–99)
Glucose-Capillary: 120 mg/dL — ABNORMAL HIGH (ref 65–99)
Glucose-Capillary: 147 mg/dL — ABNORMAL HIGH (ref 65–99)
Glucose-Capillary: 150 mg/dL — ABNORMAL HIGH (ref 65–99)
Glucose-Capillary: 291 mg/dL — ABNORMAL HIGH (ref 65–99)
Glucose-Capillary: 69 mg/dL (ref 65–99)

## 2017-04-24 LAB — CBC
HEMATOCRIT: 29.5 % — AB (ref 36.0–46.0)
HEMOGLOBIN: 9.4 g/dL — AB (ref 12.0–15.0)
MCH: 30.4 pg (ref 26.0–34.0)
MCHC: 31.9 g/dL (ref 30.0–36.0)
MCV: 95.5 fL (ref 78.0–100.0)
Platelets: 301 10*3/uL (ref 150–400)
RBC: 3.09 MIL/uL — ABNORMAL LOW (ref 3.87–5.11)
RDW: 17.3 % — ABNORMAL HIGH (ref 11.5–15.5)
WBC: 17.6 10*3/uL — ABNORMAL HIGH (ref 4.0–10.5)

## 2017-04-24 LAB — POCT ACTIVATED CLOTTING TIME
ACTIVATED CLOTTING TIME: 169 s
ACTIVATED CLOTTING TIME: 170 s
ACTIVATED CLOTTING TIME: 180 s
ACTIVATED CLOTTING TIME: 186 s
Activated Clotting Time: 147 seconds
Activated Clotting Time: 164 seconds
Activated Clotting Time: 180 seconds
Activated Clotting Time: 186 seconds
Activated Clotting Time: 191 seconds
Activated Clotting Time: 197 seconds
Activated Clotting Time: 202 seconds
Activated Clotting Time: 202 seconds
Activated Clotting Time: 213 seconds
Activated Clotting Time: 219 seconds

## 2017-04-24 LAB — PROCALCITONIN: PROCALCITONIN: 2.91 ng/mL

## 2017-04-24 LAB — TROPONIN I: Troponin I: 1.7 ng/mL (ref <0.03)

## 2017-04-24 LAB — MAGNESIUM: Magnesium: 2.1 mg/dL (ref 1.7–2.4)

## 2017-04-24 LAB — APTT: aPTT: 97 seconds — ABNORMAL HIGH (ref 24–36)

## 2017-04-24 MED ORDER — FENTANYL CITRATE (PF) 100 MCG/2ML IJ SOLN: 100.0000 ug | Freq: Once | INTRAMUSCULAR | Status: DC

## 2017-04-24 MED ORDER — INSULIN GLARGINE 100 UNIT/ML ~~LOC~~ SOLN
10.0000 [IU] | Freq: Every day | SUBCUTANEOUS | Status: DC
  Administered 2017-04-24: 10 [IU] via SUBCUTANEOUS
  Filled 2017-04-24 (×2): qty 0.1

## 2017-04-24 MED ORDER — HEPARIN SODIUM (PORCINE) 5000 UNIT/ML IJ SOLN
5000.0000 [IU] | Freq: Three times a day (TID) | INTRAMUSCULAR | Status: DC
  Administered 2017-04-24 – 2017-04-25 (×3): 5000 [IU] via SUBCUTANEOUS
  Filled 2017-04-24 (×6): qty 1

## 2017-04-24 MED ORDER — COLLAGENASE 250 UNIT/GM EX OINT
TOPICAL_OINTMENT | Freq: Every day | CUTANEOUS | Status: DC
  Administered 2017-04-24 – 2017-04-25 (×2): via TOPICAL
  Filled 2017-04-24: qty 30

## 2017-04-24 MED ORDER — MORPHINE BOLUS VIA INFUSION
1.0000 mg | INTRAVENOUS | Status: DC | PRN
  Administered 2017-04-24: 2 mg via INTRAVENOUS
  Administered 2017-04-25: 1 mg via INTRAVENOUS
  Filled 2017-04-24: qty 2

## 2017-04-24 MED ORDER — VANCOMYCIN HCL IN DEXTROSE 1-5 GM/200ML-% IV SOLN
1000.0000 mg | INTRAVENOUS | Status: DC
  Administered 2017-04-24 – 2017-04-25 (×2): 1000 mg via INTRAVENOUS
  Filled 2017-04-24 (×2): qty 200

## 2017-04-24 MED ORDER — INSULIN ASPART 100 UNIT/ML ~~LOC~~ SOLN
0.0000 [IU] | SUBCUTANEOUS | Status: DC
  Administered 2017-04-24: 1 [IU] via SUBCUTANEOUS
  Administered 2017-04-24: 2 [IU] via SUBCUTANEOUS
  Administered 2017-04-25: 1 [IU] via SUBCUTANEOUS

## 2017-04-24 MED ORDER — FENTANYL CITRATE (PF) 100 MCG/2ML IJ SOLN
100.0000 ug | INTRAMUSCULAR | Status: DC | PRN
  Administered 2017-04-24: 100 ug via INTRAVENOUS
  Filled 2017-04-24: qty 2

## 2017-04-24 MED ORDER — SODIUM CHLORIDE 0.9 % IV SOLN
0.0000 mg/h | INTRAVENOUS | Status: DC
  Administered 2017-04-24: 2 mg/h via INTRAVENOUS
  Filled 2017-04-24: qty 10

## 2017-04-24 MED ORDER — ONDANSETRON HCL 4 MG/2ML IJ SOLN
4.0000 mg | Freq: Three times a day (TID) | INTRAMUSCULAR | Status: DC | PRN
  Administered 2017-04-24: 4 mg via INTRAVENOUS
  Filled 2017-04-24: qty 2

## 2017-04-24 MED ORDER — FENTANYL 2500MCG IN NS 250ML (10MCG/ML) PREMIX INFUSION
0.0000 ug/h | INTRAVENOUS | Status: DC
  Administered 2017-04-24: 100 ug/h via INTRAVENOUS
  Filled 2017-04-24: qty 250

## 2017-04-24 NOTE — Progress Notes (Addendum)
PCCM Interval Progress Note  I have had extensive discussions with Francis DowseJoel, Mrs. Shearon StallsCrew's husband along with her two brothers. We discussed Kimberly [name]'s current circumstances, organ failures, poor prognosis. We also discussed patient's prior wishes under circumstances such as this.   The family knows that she would never have agreed to mechanical ventilation; therefore, they have decided to proceed with extubation in hopes that she will tolerate this well and be able to transition to only supplemental O2. They are aware that Kimberly [name]'s has an extremely high risk of failing extubation and if this were to occur, we would NOT re-intubate and would move to full comfort care at that point.  All questions answered and plan of care relayed to family and nursing staff.   Rutherford Guysahul Desai, [place] - [facility] Pager: [phone]  or [phone] 04/24/2017, 1:31 PM

## 2017-04-24 NOTE — Progress Notes (Signed)
CVVHD Management.  K 5.7, on 4k dialysate and 0K replacement.  Expect K to be down later in day and will probably switch to all 4K bath in next 24hr. Bicarb better.  Can DC 75cc/hr NS.  Spoke with brother in [employer].  She is able to follow commands.

## 2017-04-24 NOTE — Progress Notes (Addendum)
PULMONARY / CRITICAL CARE MEDICINE   Name: Kimberly [name] MRN: [id number] DOB: [birth year]    ADMISSION DATE:  05-14-17 CONSULTATION DATE:  05-14-17  REFERRING MD:  Dr. Dalene Seltzer  CHIEF COMPLAINT:  Hypotension  HISTORY OF PRESENT ILLNESS:  HPI obtained from medical chart review as patient is intubated and sedated.  61 year old female with PMH of ESRD (HD MWF), COPD on home ?2L O2 (last seen by Dr. Sherene Sires on 07/2015), HTN, DM, GERD, CAD, CVA s/p right hemiparesis, PVD, otitis media s/p myringotomy tubes 8/7, chronic hypotension (on midodrine on HD days) who presented 8/13 with altered mental status and hypotension.   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  Additionally, there was concern over infected AVG after having a right axilla boil that was positive for Providencia stuartii ad Acintobacter.  She was evaluated by vascular surgery with no evidence of infected AVG and discharged back to SNF.  Of note, patient was a prior DNR.   Presented from [facility] facility after being found altered this morning, calling out for help and found to be hypotensive with EMS as 86/37.  On 8/11, patient had complained of cough and wheezing, with her "asthma acting up" at her Nephrology appointment.   On arrival to ER, SBP 68, temp 99.5, HR 106. Labs noted for WBC 13.9, lactate 3.08, troponin 0.01, K 6.2, Hgb 8.9, Code sepsis initiated.  She was given NS bolus and started on vancomycin and fortaz.  CXR with bilateral pulmonary inflitrates/edema.  In the ER, she became unresponsive requiring intubation.  Placed on phenylephrine and norepinephrine for continued hypotension.  PCCM to admit, nephrology consulting.    SUBJECTIVE:  Levophed at 59mcg/min.  No acute events.  VITAL SIGNS: BP (!) 96/57 (BP Location: Left Wrist)   Pulse 82   Temp (!) 97.3 F (36.3 C) (Axillary)   Resp (!) 26   Ht 5\' 3"   (1.6 m)   Wt 98 kg (216 lb 0.8 oz)   SpO2 100%   BMI 38.27 kg/m   HEMODYNAMICS:    VENTILATOR SETTINGS: Vent Mode: PRVC FiO2 (%):  [40 %-100 %] 40 % Set Rate:  [20 bmp-28 bmp] 28 bmp Vt Set:  [420 mL] 420 mL PEEP:  [5 cmH20] 5 cmH20 Plateau Pressure:  [22 cmH20-30 cmH20] 22 cmH20  INTAKE / OUTPUT: I/O last 3 completed shifts: In: 4926.7 [P.O.:8; I.V.:3298.7; NG/GT:70; IV Piggyback:1550] Out: 1822 [Other:1822]  PHYSICAL EXAMINATION: General:  Obese adult female, in NAD. HEENT: MM pink/moist, ETT/OGT, PERRL. Neuro: Sedated, does not follow commands. Anxious at times. CV: RRR, no M/R/G. PULM: Coarse crackles bilaterally. GI: obese, soft, non-tender, bs active.  Extremities: warm/dry, no edema, RUA AVG, Left AKA. Skin: Bruising to BLE's and RLE.   LABS:  BMET  Recent Labs Lab May 14, 2017 1322 05-14-2017 1638 04/24/17 0349  NA 133* 133* 134*  K 6.0* 6.1* 5.7*  CL 97* 99* 101  CO2 16* 12* 22  BUN 39* 39* 26*  CREATININE 6.78* 6.64* 4.25*  GLUCOSE 398* 408* 173*    Electrolytes  Recent Labs Lab 04/20/17 1015  05/14/17 1322 May 14, 2017 1638 04/24/17 0349  CALCIUM 9.1  < > 9.9 8.7* 8.7*  MG  --   --  2.3  --  2.1  PHOS 5.5*  --   --  7.5* 3.3  3.3  < > = values in this interval not displayed.  CBC  Recent Labs  Lab 05/04/2017 1029 04/19/2017 2200 04/24/17 0349  WBC 13.9* 21.8* 17.6*  HGB 8.9* 9.1* 9.4*  HCT 29.7* 28.9* 29.5*  PLT 245 288 301    Coag's  Recent Labs Lab 04/24/2017 1029 04/24/17 0834  APTT  --  97*  INR 1.43  --     Sepsis Markers  Recent Labs Lab 04/11/2017 1042 04/30/2017 1638 05/10/2017 1639 04/24/17 0349  LATICACIDVEN 3.08*  --  2.7*  --   PROCALCITON  --  1.69  --  2.91    ABG  Recent Labs Lab 04/27/2017 1256  PHART 7.149*  PCO2ART 59.1*  PO2ART 341.0*    Liver Enzymes  Recent Labs Lab 05/04/2017 1029 04/24/2017 1638 04/24/17 0349  AST 22  --   --   ALT <5*  --   --   ALKPHOS 165*  --   --   BILITOT 0.8  --   --    ALBUMIN 2.4* 2.4* 2.4*    Cardiac Enzymes  Recent Labs Lab 04/16/2017 1322 05/09/2017 1638 04/24/17 0130  TROPONINI 0.17* 0.84* 1.70*    Glucose  Recent Labs Lab 04/24/17 0450 04/24/17 0550 04/24/17 0656 04/24/17 0803 04/24/17 0833 04/24/17 0835  GLUCAP 147* 120* 121* 69 35* 113*    Imaging Dg Chest Port 1 View  Result Date: 04/24/2017 CLINICAL DATA:  Respiratory failure EXAM: PORTABLE CHEST 1 VIEW COMPARISON:  Portable chest x-ray of April 23, 2017 FINDINGS: The lungs are mildly hypoinflated. The interstitial markings remain increased greatest on the left. There is no pleural effusion or pneumothorax. The cardiac silhouette is enlarged and the pulmonary vascularity engorged. The endotracheal tube tip lies approximately 3 cm above the carina. The esophagogastric tube tip projects below the inferior margin of the image. The right internal jugular catheter tip projects over the proximal SVC. IMPRESSION: Slight interval increase in the interstitial markings bilaterally accentuated by hypoinflation. Pulmonary interstitial edema is the most likely etiology. The support tubes are in stable position. Electronically Signed   By: David  [place]and M.D.   On: 04/24/2017 07:29   Dg Chest Port 1 View  Result Date: 05/06/2017 CLINICAL DATA:  Central line placement. EXAM: PORTABLE CHEST 1 VIEW COMPARISON:  Film earlier today at 1130 hours FINDINGS: Non tunneled dialysis catheter has been placed via the right internal jugular vein with the catheter tip in the upper SVC. No pneumothorax after line placement. Degree of pulmonary edema appears improved since the prior study. Endotracheal tube remains with the tip 3.5 cm above the carina. Gastric decompression tube extends below the diaphragm. IMPRESSION: Non tunneled dialysis catheter tip in upper SVC without pneumothorax. There is diminishment in pulmonary edema since the prior chest x-ray. Electronically Signed   By: Irish Lack M.D.   On: 04/19/2017  16:40   Dg Chest Portable 1 View  Result Date: 04/20/2017 CLINICAL DATA:  Intubation . EXAM: PORTABLE CHEST 1 VIEW COMPARISON:  04/14/2017. FINDINGS: Endotracheal tube noted with its tip 3.1 cm above the carina. NG tube noted with tip below left hemidiaphragm. Side-hole is above the gastroesophageal junction. Advancement of the catheter approximately 15 cm should be considered. Cardiomegaly. Diffuse bilateral pulmonary infiltrates consistent pulmonary edema noted. Bilateral pneumonia cannot be excluded. Stable elevation right hemidiaphragm. IMPRESSION: 1. Endotracheal tube noted with its tip 3.1 cm above the carina. NG tube noted with tip below left hemidiaphragm. Side-hole is above the gastroesophageal junction. Advancement of the NG tube approximately 15 cm should be considered. 2. Congestive heart failure with bilateral pulmonary edema. Changes of congestive  heart failure have progressed from prior exam. Electronically Signed   By: Maisie Fus  Register   On: 2017/05/04 12:01   Dg Chest Port 1 View  Result Date: 05/04/17 CLINICAL DATA:  Code sepsis EXAM: PORTABLE CHEST 1 VIEW COMPARISON:  04/21/2017 FINDINGS: Mild cardiomegaly and vascular congestion, similar to prior study. Low lung volumes. No confluent airspace opacity or effusion. No acute bony abnormality. IMPRESSION: Low lung volumes with cardiomegaly and vascular congestion. Electronically Signed   By: Charlett Nose M.D.   On: 05/04/2017 10:09   Dg Abd Portable 1v  Result Date: 2017-05-04 CLINICAL DATA:  OG tube placement EXAM: PORTABLE ABDOMEN - 1 VIEW COMPARISON:  Earlier the same day FINDINGS: NG tube tip is in the proximal to mid stomach. The tube could be advanced 7 or 8 cm put the tip in the distal portion of the stomach. Proximal port of the tube is below the expected location of GE junction. Stomach is decompressed. No gaseous bowel dilatation. IMPRESSION: NG tube tip is in the proximal to mid stomach and could be advanced slightly as  warranted. The proximal port of the tube is below the expected location of the EG junction. Electronically Signed   By: Kennith Center M.D.   On: May 04, 2017 19:02   Dg Abd Portable 1v  Result Date: 05-04-17 CLINICAL DATA:  Orogastric tube positioning. EXAM: PORTABLE ABDOMEN - 1 VIEW COMPARISON:  Film earlier today at 1206 hours FINDINGS: The orogastric tube has been advanced with redundant catheter now coiled in the stomach. The tip of the catheter lies in the region of the distal antrum. Gas seen previously in the stomach has now been completely decompressed. IMPRESSION: Orogastric tube now coiled in the stomach with tip in the region of the distal antrum. The stomach is now decompressed. Electronically Signed   By: Irish Lack M.D.   On: 05-04-2017 16:43   Dg Abd Portable 1v  Result Date: 05/04/2017 CLINICAL DATA:  Status post orogastric tube placement. EXAM: PORTABLE ABDOMEN - 1 VIEW COMPARISON:  None in PACs FINDINGS: The tip of the orogastric tube lies in the region of the gastric cardia with the proximal port at or just above the GE junction. There is moderate gaseous distention of the stomach. IMPRESSION: Orogastric tube positioned as noted above. Advancement by approximately 10-15 cm is recommended. Electronically Signed   By: David  Swaziland M.D.   On: 2017-05-04 12:16   STUDIES:  CXR 8/13 >> low lung volumes w/cardiomegaly and vascular congestion CXR 8/13 post intubation >> ETT 3.1 cm above carina, NG tip below left hemidiaphragm w/side hole above gastroesophageal junction with suggested advancement by 15 cm; CHF w/bilateral pulmonary edema which have progressed from prior exam; stable elevation of right hemidiaphragm ABD 1 view 8/13 >> OGT in region of gastric cardia, suggested advancement recommended by 10-15 cm  Echo 8/14 >   CULTURES: 8/13 BC >> 8/13 UC >> 8/13 Sputum >>  ANTIBIOTICS: 8/13 vancomycin >> 8/13 zosyn >>  SIGNIFICANT EVENTS: 8/13 Admit,  MODS  LINES/TUBES: PIV x 2 ETT 8/13 >> OGT 8/13 >> R IJ HD Cath 8/14 >   DISCUSSION: 61 yo F with PMH ESRD on MWF HD, chronic hypotension, COPD on home O2, CVA, DM, PVD s/p recent L AKA on 8/7 with wound vac presented with altered mental status and hypotension who required intubation for respiratory distress. Previously DNR / DNI.   ASSESSMENT / PLAN:  PULMONARY A: Acute on chronic hypoxic/hypercarbic respiratory failure Pulmonary edema +/- HCAP Hx COPD on  home O2, 2L  P:   Continue full vent support Assess ABG Daily SBT when stable Volume removal restricted given shock Brovana and pulmicort nebs BID Albuterol/atrovent prn  See ID/Renal below Follow CXR  CARDIOVASCULAR A:  Shock- septic vs cardiogenic; awaiting echo. Etiology of sepsis unclear at this point. Normal cortisol noted. Troponin bump - presumed demand. Hx CAD, chronic hypotension (on midodrine), LBBB- noted on previous EKG, PVD s/p L AKA with wound vac 8/7 (Wound vac removed 8/13 by ortho). P:  F/u on echo Levophed for MAP > 55 (chronically hypotensive) Continue preadmission midodrine No escalation of care May need to consider CVL placement given lack of access (only IV access at this point is single port on HD cath)  RENAL A:   ESRD- MWF with RUE AVG AGMA/ lactic acidosis  - resolved Hyponatremia - suspect hypervolemic Mild hyperkalemia P:   Will place triaylsis catheter for initiation of CRRT Stat BMET/ Mag now, then trend  Appreciate Renal assistance Volume removal inhibited due to shock  GASTROINTESTINAL A:   GERD NPO Hypoalbuminemia P:   NPO Protonix for SUP Start TF's  HEMATOLOGIC A:   Chronic Anemia P:  Trend CBC Transfuse for Hgb > 7  INFECTIOUS A:   R/o HCAP Right wound vac to L AKA - vac removed 8/13 by ortho.  Wound appears clean. P:   Follow cultures Trend PCT Continue empiric vanc and zosyn, narrow as able Tend WBC/ monitor fever curve Ortho following LLE  wound  ENDOCRINE A:   Hyperglycemia  Hx DM  P:   CBG q 4 SSI sensitive  NEUROLOGIC A:   Acute encephalopathy Hx CVA with residual right hemiparesis P:   RASS goal: -1 PAD protocol with Fentanyl and versed prn  Daily WUA   FAMILY  - Updates: Husband called and updated on patient's condition.  He confirmed that patient was a DNR prior to this and most likely she would not want any of these measures, however he is not comfortable making the decision right now to withdraw care.  Will continue current therapies, including placing a temporary HD catheter for CRRT, with no further augmentation of care.  Code status updated to DNR.   - Inter-disciplinary family meet or Palliative Care meeting due by:  04/30/2017  CCT 30 min.   Rutherford Guysahul Desai, [place] Sidonie Dickens- [facility] Pager: [phone]  or [phone] 04/24/2017, 9:48 AM   STAFF NOTE: I, Rory Percyaniel Foxx Klarich, MD FACP have personally reviewed patient's available data, including medical history, events of note, physical examination and test results as part of my evaluation. I have discussed with resident/NP and other care providers such as pharmacist, RN and RRT. In addition, I personally evaluated patient and elicited key findings of: rass 1, not fc, jvd flat to slight increased, obese, lungs coarse ronchi, edema present, AKA dressed ortho evaluated, pcxr which I reviewed showed bibasilar int changes possible hcap but is underpenetrated and slight increase int change son left, her presentation is concerning for septic shock, hcap source unclear on lower ext ortho reviewed, she has grafts and other possible source, she has improved levophed needs, MAP goal 50-55, cvvhd may need to reduce volume removal, family has been clear to not escalate given her poor prognosis, last ABg reviewed, maintain higher NMV, may need rate to 35, midodrine maintain, no SBt with high MV needs, feed start today vital, wound care, maintain  current abx coverage and follow BC , echo  for veg, graft not warm or red, family in room I updated, fent drip on board now, may need additional access would like to avoid this, glu was on low sode, low 65, lantus 10 as off drip and SSI, may need T fcoverage The patient is critically ill with multiple organ systems failure and requires high complexity decision making for assessment and support, frequent evaluation and titration of therapies, application of advanced monitoring technologies and extensive interpretation of multiple databases.   Critical Care Time devoted to patient care services described in this note is 35 Minutes. This time reflects time of care of this signee: Rory Percy, MD FACP. This critical care time does not reflect procedure time, or teaching time or supervisory time of PA/NP/Med student/Med Resident etc but could involve care discussion time. Rest per NP/medical resident whose note is outlined above and that I agree with   Mcarthur Rossetti. Tyson Alias, MD, FACP Pgr: [phone] [facility] 04/24/2017 11:01 AM   If she was full dnr prior and never wanted this we should NOT allow her to suffer further on vent and pressors. She will not have a reasonable functional recovery.

## 2017-04-24 NOTE — Procedures (Signed)
Extubation Procedure Note  Patient Details:   Name: Kimberly [name] DOB: 02/01/1956 MRN: [id number]   Airway Documentation:     Evaluation  O2 sats: stable throughout Complications: No apparent complications Patient did tolerate procedure well. Bilateral Breath Sounds: Diminished   Yes   Positive cuff leak noted. Pt placed on [place] 4 L with humidity, no increased WOB noted.  No stridor noted.   Family and RN in room.    Rayburn FeltJean S Jerone Cudmore 04/24/2017, 3:59 PM

## 2017-04-24 NOTE — Progress Notes (Signed)
RT unable to obtain an ABG using doppler at left brachial.  PA notified.

## 2017-04-24 NOTE — Progress Notes (Signed)
Nutrition Consult--Brief Note  Received consult for TF initiation and management.  Plan of care has changed after family meeting with CCM and Renal teams. Patient to be extubated and will transition to comfort care if she declines after extubation. No nutrition interventions indicated at this time. Please re-consult if nutrition concerns arise.  Joaquin CourtsKimberly Olene Godfrey, RD, LDN, CNSC Pager [phone] After Hours Pager [phone]

## 2017-04-24 NOTE — Progress Notes (Signed)
Inpatient Diabetes Program Recommendations  AACE/ADA: New Consensus Statement on Inpatient Glycemic Control (2015)  Target Ranges:  Prepandial:   less than 140 mg/dL      Peak postprandial:   less than 180 mg/dL (1-2 hours)      Critically ill patients:  140 - 180 mg/dL   Review of Glycemic Control  Diabetes history: DM 2 Outpatient Diabetes medications: Lantus 30 units BID, Novolog Moderate 0-15 units tid Current orders for Inpatient glycemic control: Novolog Sensitive 0-9 units Q4 hours  A1c 6.6 on 04/17/17  Inpatient Diabetes Program Recommendations:    Patient on the vent. Consider d/cing current Glycemic control orders and order the ICU Glycemic Control Order Set. Patient's renal function elevated. Looking at home regimen and past admissions, patient may need Lantus 20 units Daily along with Nov 2-6 units Q4 hours.  Thanks,  Christena DeemShannon Jermesha Sottile RN, MSN, [facility] CenterCC[facility] Inpatient Diabetes Coordinator Team Pager [phone] (8a-5p)

## 2017-04-24 NOTE — Consult Note (Signed)
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 Blister extends to posterior LE Intragluteal skin fold and lower buttocks: area of partial thickness skin loss indicative of moisture PLUS pressure injury (Stage 2): 6cm x 4cm area of MASD with Stage 2 pressure injury measuring 2cm x 1cm x 0.1cm on each buttock. Wound bed: Drainage (amount, consistency, odor)  Periwound: hemosiderin staining on the RLE with flaking dry skin in the pretibial area and toes. Moisture associated skin damage Dressing procedure/placement/frequency: Dr. Magnus IvanBlackman (orthopedics), in to see last pm and discontinued incisional NPWT to the left AKA incision. This is to be dressed with a dry dressing per his note. The right heel neuropathic ulcer (full thickness) will be dressed with collagenase (Santyl) as it is 50% red, 50% yellow slough. Placement of this foot in a pressure redistribution boot will additionally correct lateral rotation and offload the large, serum filled blister.  Conservative care of this blister includes covering it with xeroform gauze and topping with an ABD pad prior to placement of the SCDs. Moisture associated skin damage (specifically, intertriginous dermitis) is noted in the sub pannicular area.  We will implement use of our house antimicrobial textile to wick moisture. The area of moisture associated skin damage to the intragluteal skin fold is complicated by the presence of two, Stage 2 pressure injuries, one on each buttock.  These will  be treated using a soft silicone foam dressing and with turning and repositioning to avoid the supine position.  It is noted that this patient is critically ill and that her numerous comorbid conditions continue to put the integumentary system at high risk for breakdown. WOC nursing team will not follow, but will remain available to this patient, the nursing and medical teams.  Please re-consult if needed and if skin condition is not consistent with patient's overall clinical picture. Thank you for inviting us to participate in the development of this patient's POC. Ladona MowLaurie Elyas Villamor, MSN, RN, GNP, Hans EdenCWOCN, CWON-AP, FAAN  Pager# [phone]

## 2017-04-24 NOTE — Progress Notes (Signed)
PCCM Interval Progress Note  Called and spoke to Kimberly [name]'s husband Kimberly [name] again.  Discussed that she appears to be in pain and discomfort and that given her prior wishes, the thing to do and my recommendation is to treat this and prioritize her comfort above all. Levophed already maxed at 8110mcg/min per prior discussions with family.  Pt currently on fentanyl gtt at 75mcg.    Pt not in respiratory distress but occasionally does have tachypnea and brother mentioned that earlier pt did state she had difficulty breathing.  Will change fentanyl got morphine gtt and have asked RN to ensure pt's comfort over everything else.   Kimberly [name], GeorgiaPA - C [place] [facility] Pager: [phone]  or [phone] 04/24/2017, 5:16 PM

## 2017-04-24 NOTE — Progress Notes (Signed)
eLink Physician-Brief Progress Note Patient Name: Kimberly ManilaSusan K [name] DOB: 10-Jun-1956 MRN: [id number]   Date of Service  04/24/2017  HPI/Events of Note  Nausea.  eICU Interventions  Will order Zofran 4 mg IV Q 8 hours PRN.     Intervention Category Intermediate Interventions: Other:  Lenell AntuSommer,Shamyra Farias Eugene 04/24/2017, 7:54 PM

## 2017-04-25 ENCOUNTER — Ambulatory Visit (INDEPENDENT_AMBULATORY_CARE_PROVIDER_SITE_OTHER): Payer: Medicare Other | Admitting: Family

## 2017-04-25 ENCOUNTER — Encounter (HOSPITAL_COMMUNITY): Payer: Self-pay | Admitting: *Deleted

## 2017-04-25 ENCOUNTER — Inpatient Hospital Stay (HOSPITAL_COMMUNITY): Payer: Medicare Other

## 2017-04-25 LAB — RENAL FUNCTION PANEL
ALBUMIN: 2.2 g/dL — AB (ref 3.5–5.0)
ALBUMIN: 2 g/dL — AB (ref 3.5–5.0)
Anion gap: 9 (ref 5–15)
Anion gap: 9 (ref 5–15)
BUN: 6 mg/dL (ref 6–20)
CALCIUM: 8.6 mg/dL — AB (ref 8.9–10.3)
CALCIUM: 8.5 mg/dL — AB (ref 8.9–10.3)
CO2: 25 mmol/L (ref 22–32)
CO2: 25 mmol/L (ref 22–32)
CREATININE: 1.28 mg/dL — AB (ref 0.44–1.00)
Chloride: 102 mmol/L (ref 101–111)
Chloride: 102 mmol/L (ref 101–111)
Creatinine, Ser: 1.19 mg/dL — ABNORMAL HIGH (ref 0.44–1.00)
GFR calc Af Amer: 51 mL/min — ABNORMAL LOW (ref >60)
GFR, EST AFRICAN AMERICAN: 56 mL/min — AB (ref >60)
GFR, EST NON AFRICAN AMERICAN: 48 mL/min — AB (ref >60)
GFR, EST NON AFRICAN AMERICAN: 44 mL/min — AB (ref >60)
Glucose, Bld: 118 mg/dL — ABNORMAL HIGH (ref 65–99)
Glucose, Bld: 132 mg/dL — ABNORMAL HIGH (ref 65–99)
PHOSPHORUS: 2.4 mg/dL — AB (ref 2.5–4.6)
PHOSPHORUS: 2.9 mg/dL (ref 2.5–4.6)
Potassium: 3.8 mmol/L (ref 3.5–5.1)
Potassium: 3.7 mmol/L (ref 3.5–5.1)
SODIUM: 136 mmol/L (ref 135–145)
Sodium: 136 mmol/L (ref 135–145)

## 2017-04-25 LAB — GLUCOSE, CAPILLARY
GLUCOSE-CAPILLARY: 107 mg/dL — AB (ref 65–99)
GLUCOSE-CAPILLARY: 97 mg/dL (ref 65–99)
GLUCOSE-CAPILLARY: 110 mg/dL — AB (ref 65–99)
Glucose-Capillary: 144 mg/dL — ABNORMAL HIGH (ref 65–99)

## 2017-04-25 LAB — CBC
HCT: 26.5 % — ABNORMAL LOW (ref 36.0–46.0)
Hemoglobin: 8.2 g/dL — ABNORMAL LOW (ref 12.0–15.0)
MCH: 29.8 pg (ref 26.0–34.0)
MCHC: 30.9 g/dL (ref 30.0–36.0)
MCV: 96.4 fL (ref 78.0–100.0)
PLATELETS: 200 10*3/uL (ref 150–400)
RBC: 2.75 MIL/uL — AB (ref 3.87–5.11)
RDW: 17.4 % — AB (ref 11.5–15.5)
WBC: 16.3 10*3/uL — AB (ref 4.0–10.5)

## 2017-04-25 LAB — BLOOD CULTURE ID PANEL (REFLEXED)
Acinetobacter baumannii: NOT DETECTED
CANDIDA ALBICANS: NOT DETECTED
CANDIDA GLABRATA: NOT DETECTED
CANDIDA PARAPSILOSIS: NOT DETECTED
CANDIDA TROPICALIS: NOT DETECTED
Candida krusei: NOT DETECTED
Carbapenem resistance: NOT DETECTED
ENTEROBACTER CLOACAE COMPLEX: NOT DETECTED
ENTEROCOCCUS SPECIES: NOT DETECTED
ESCHERICHIA COLI: NOT DETECTED
Enterobacteriaceae species: NOT DETECTED
Haemophilus influenzae: NOT DETECTED
KLEBSIELLA PNEUMONIAE: NOT DETECTED
Klebsiella oxytoca: NOT DETECTED
LISTERIA MONOCYTOGENES: NOT DETECTED
Methicillin resistance: NOT DETECTED
Neisseria meningitidis: NOT DETECTED
PROTEUS SPECIES: NOT DETECTED
Pseudomonas aeruginosa: NOT DETECTED
SERRATIA MARCESCENS: NOT DETECTED
STREPTOCOCCUS PNEUMONIAE: NOT DETECTED
Staphylococcus aureus (BCID): NOT DETECTED
Staphylococcus species: NOT DETECTED
Streptococcus agalactiae: NOT DETECTED
Streptococcus pyogenes: NOT DETECTED
Streptococcus species: NOT DETECTED
VANCOMYCIN RESISTANCE: NOT DETECTED

## 2017-04-25 LAB — APTT: APTT: 198 s — AB (ref 24–36)

## 2017-04-25 LAB — POCT ACTIVATED CLOTTING TIME
ACTIVATED CLOTTING TIME: 235 s
Activated Clotting Time: 241 seconds
Activated Clotting Time: 224 seconds

## 2017-04-25 LAB — MAGNESIUM: MAGNESIUM: 2.4 mg/dL (ref 1.7–2.4)

## 2017-04-25 MED ORDER — DOXERCALCIFEROL 4 MCG/2ML IV SOLN
1.0000 ug | INTRAVENOUS | Status: DC
  Administered 2017-04-25: 1 ug via INTRAVENOUS
  Filled 2017-04-25: qty 2

## 2017-04-26 ENCOUNTER — Telehealth: Payer: Self-pay

## 2017-04-26 LAB — CULTURE, BLOOD (ROUTINE X 2): SPECIAL REQUESTS: ADEQUATE

## 2017-04-26 LAB — GLUCOSE, CAPILLARY: GLUCOSE-CAPILLARY: 35 mg/dL — AB (ref 65–99)

## 2017-04-26 NOTE — Telephone Encounter (Signed)
On 04/26/17 I received a death certificate from [facility] (Faxed). The death certificate is for cremation. The patient is a patient of Doctor Feinstien. [id number] for signature.  On 05/01/17 I received the death certificate back from Doctor Tyson AliasFeinstein. I got the death certificate ready and called the funeral home to let them know the death certificate is ready for pickup. I also faxed a copy to the funeral home per the funeral home request.

## 2017-04-27 ENCOUNTER — Telehealth: Payer: Self-pay

## 2017-04-27 NOTE — Telephone Encounter (Signed)
On 04/27/17 I received a death certificate from [facility] (original). The death certificate is for cremation. The patient is a patient of Doctor Tyson Alias. The death certificate will be taken to [facility] ([phone]) this am for signature.  On 05/31/2017 I received the death certificate back from Doctor Tyson Alias. I got the death certificate ready and called the funeral home to let them know the death certificate is ready for pickup. I also faxed a copy to the funeral home per the funeral home request.

## 2017-04-28 LAB — CULTURE, BLOOD (ROUTINE X 2)
Culture: NO GROWTH
Special Requests: ADEQUATE

## 2017-05-12 NOTE — Progress Notes (Signed)
CRITICAL VALUE ALERT  Critical Value:  PTT 198  Date & Time Notied:  04/26/2017 @ 0345  Provider Notified: Elisabeth CaraAlva, R.  Orders Received/Actions taken: Turn off heparin in CRRT treatment

## 2017-05-12 NOTE — Progress Notes (Addendum)
  PHARMACY - PHYSICIAN COMMUNICATION CRITICAL VALUE ALERT - BLOOD CULTURE IDENTIFICATION (BCID)  Results for orders placed or performed during the hospital encounter of 21-Jun-2017  Blood Culture ID Panel (Reflexed) (Collected: 07-08-2017 10:22 AM)  Result Value Ref Range   Enterococcus species NOT DETECTED NOT DETECTED   Vancomycin resistance NOT DETECTED NOT DETECTED   Listeria monocytogenes NOT DETECTED NOT DETECTED   Staphylococcus species NOT DETECTED NOT DETECTED   Staphylococcus aureus NOT DETECTED NOT DETECTED   Methicillin resistance NOT DETECTED NOT DETECTED   Streptococcus species NOT DETECTED NOT DETECTED   Streptococcus agalactiae NOT DETECTED NOT DETECTED   Streptococcus pneumoniae NOT DETECTED NOT DETECTED   Streptococcus pyogenes NOT DETECTED NOT DETECTED   Acinetobacter baumannii NOT DETECTED NOT DETECTED   Enterobacteriaceae species NOT DETECTED NOT DETECTED   Enterobacter cloacae complex NOT DETECTED NOT DETECTED   Escherichia coli NOT DETECTED NOT DETECTED   Klebsiella oxytoca NOT DETECTED NOT DETECTED   Klebsiella pneumoniae NOT DETECTED NOT DETECTED   Proteus species NOT DETECTED NOT DETECTED   Serratia marcescens NOT DETECTED NOT DETECTED   Carbapenem resistance NOT DETECTED NOT DETECTED   Haemophilus influenzae NOT DETECTED NOT DETECTED   Neisseria meningitidis NOT DETECTED NOT DETECTED   Pseudomonas aeruginosa NOT DETECTED NOT DETECTED   Candida albicans NOT DETECTED NOT DETECTED   Candida glabrata NOT DETECTED NOT DETECTED   Candida krusei NOT DETECTED NOT DETECTED   Candida parapsilosis NOT DETECTED NOT DETECTED   Candida tropicalis NOT DETECTED NOT DETECTED    Name of physician (or Provider) Contacted: CCM rounding pharmacist  On vancomycin and Zosyn to rule out HCAP. Also with R wound vac to L AKA but appears clean/ BCID did not detect anything, probable contaminant.   Changes to prescribed antibiotics required: No changes needed. Follow up clinical  progress and cx results  Enzo BiNathan Fonda Rochon, PharmD, [facility] Clinical Pharmacist Pager [phone] 05/05/2017 10:32 AM

## 2017-05-12 NOTE — Progress Notes (Signed)
eLink Physician-Brief Progress Note Patient Name: Galen ManilaSusan K Baquera DOB: 08-19-1956 MRN: [id number]   Date of Service  05/11/2017  HPI/Events of Note  Patient hypotensive and bradycardic to 30 after weaning Norepinephrine from 10 to 6 mcg/min. Spoke with husband, Valetta FullerJoel Richeson, to confirm that we don't plan to increase the Norepinephrine IV infusion. He doesn't want to increase the Norepinephrine and requests that his wife be kept as comfortable as possible.   eICU Interventions  Will make patient comfort measures per Dr. Gwendolyn GrantFeinstein's note.     Intervention Category Major Interventions: End of life / care limitation discussion  Lenell AntuSommer,Quintina Hakeem Eugene 04/26/2017, 7:08 PM

## 2017-05-12 NOTE — Progress Notes (Signed)
PULMONARY / CRITICAL CARE MEDICINE   Name: Kimberly [name] MRN: [id number] DOB: 11-26-1955    ADMISSION DATE:  May 03, 2017 CONSULTATION DATE:  03-May-2017  REFERRING MD:  Dr. Dalene Seltzer  CHIEF COMPLAINT:  Hypotension  HISTORY OF PRESENT ILLNESS:  HPI obtained from medical chart review as patient is intubated and sedated.  61 year old female with PMH of ESRD (HD MWF), COPD on home ?2L O2 (last seen by Dr. Sherene Sires on 07/2015), HTN, DM, GERD, CAD, CVA s/p right hemiparesis, PVD, otitis media s/p myringotomy tubes 8/7, chronic hypotension (on midodrine on HD days) who presented 8/13 with altered mental status and hypotension.   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  Additionally, there was concern over infected AVG after having a right axilla boil that was positive for Providencia stuartii ad Acintobacter.  She was evaluated by vascular surgery with no evidence of infected AVG and discharged back to SNF.  Of note, patient was a prior DNR.   Presented from [facility] facility after being found altered this morning, calling out for help and found to be hypotensive with EMS as 86/37.  On 8/11, patient had complained of cough and wheezing, with her "asthma acting up" at her Nephrology appointment.   On arrival to ER, SBP 68, temp 99.5, HR 106. Labs noted for WBC 13.9, lactate 3.08, troponin 0.01, K 6.2, Hgb 8.9, Code sepsis initiated.  She was given NS bolus and started on vancomycin and fortaz.  CXR with bilateral pulmonary inflitrates/edema.  In the ER, she became unresponsive requiring intubation.  Placed on phenylephrine and norepinephrine for continued hypotension.  PCCM to admit, nephrology consulting.    SUBJECTIVE:  Comfort ISH approach started, on morphine, cvvhd, levo at 10  VITAL SIGNS: BP (!) 86/51 (BP Location: Left Wrist)   Pulse 88   Temp (!) 97.3 F (36.3 C) (Oral)   Resp 17    Ht 5\' 3"  (1.6 m)   Wt 60.9 kg (134 lb 4.2 oz)   SpO2 100%   BMI 23.78 kg/m   HEMODYNAMICS:    VENTILATOR SETTINGS: Vent Mode: PRVC FiO2 (%):  [40 %] 40 % Set Rate:  [28 bmp] 28 bmp Vt Set:  [420 mL] 420 mL PEEP:  [5 cmH20] 5 cmH20 Plateau Pressure:  [13 cmH20] 13 cmH20  INTAKE / OUTPUT: I/O last 3 completed shifts: In: 2856.6 [P.O.:8; I.V.:2385.6; Other:13; IV Piggyback:450] Out: 3754 [Other:3754]  PHYSICAL EXAMINATION:  eneral: midl distress Neuro: fc, O x 3, moves upper etx HEENT: obese, line clean PULM: crackles CV:  s1 s2 RRR no r distant GI: soft, bs low, no r/g  Extremities: aka wound wrapped, edema  LABS:  BMET  Recent Labs Lab 03-May-2017 1322 2017/05/03 1638 04/24/17 0349  NA 133* 133* 134*  K 6.0* 6.1* 5.7*  CL 97* 99* 101  CO2 16* 12* 22  BUN 39* 39* 26*  CREATININE 6.78* 6.64* 4.25*  GLUCOSE 398* 408* 173*    Electrolytes  Recent Labs Lab 04/20/17 1015  05-03-2017 1322 May 03, 2017 1638 04/24/17 0349  CALCIUM 9.1  < > 9.9 8.7* 8.7*  MG  --   --  2.3  --  2.1  PHOS 5.5*  --   --  7.5* 3.3  3.3  < > = values in this interval not displayed.  CBC  Recent Labs Lab 05/03/2017 2200 04/24/17 0349 05/10/2017 0309  WBC 21.8* 17.6* 16.3*  HGB  9.1* 9.4* 8.2*  HCT 28.9* 29.5* 26.5*  PLT 288 301 200    Coag's  Recent Labs Lab 04/17/2017 1029 04/24/17 0834 04-04-17 0309  APTT  --  97* 198*  INR 1.43  --   --     Sepsis Markers  Recent Labs Lab 04/14/2017 1042 05/07/2017 1638 05/07/2017 1639 04/24/17 0349  LATICACIDVEN 3.08*  --  2.7*  --   PROCALCITON  --  1.69  --  2.91    ABG  Recent Labs Lab 04/20/2017 1256  PHART 7.149*  PCO2ART 59.1*  PO2ART 341.0*    Liver Enzymes  Recent Labs Lab 04/29/2017 1029 04/26/2017 1638 04/24/17 0349  AST 22  --   --   ALT <5*  --   --   ALKPHOS 165*  --   --   BILITOT 0.8  --   --   ALBUMIN 2.4* 2.4* 2.4*    Cardiac Enzymes  Recent Labs Lab 04/13/2017 1322 04/21/2017 1638 04/24/17 0130   TROPONINI 0.17* 0.84* 1.70*    Glucose  Recent Labs Lab 04/24/17 1145 04/24/17 1601 04/24/17 2000 04/24/17 2356 04-04-17 0319 04-04-17 0749  GLUCAP 117* 163* 150* 117* 144* 107*    Imaging No results found. STUDIES:  CXR 8/13 >> low lung volumes w/cardiomegaly and vascular congestion CXR 8/13 post intubation >> ETT 3.1 cm above carina, NG tip below left hemidiaphragm w/side hole above gastroesophageal junction with suggested advancement by 15 cm; CHF w/bilateral pulmonary edema which have progressed from prior exam; stable elevation of right hemidiaphragm ABD 1 view 8/13 >> OGT in region of gastric cardia, suggested advancement recommended by 10-15 cm   CULTURES: 8/13 BC >> 8/13 UC >> 8/13 Sputum >>  ANTIBIOTICS: 8/13 vancomycin >> 8/13 zosyn >>  SIGNIFICANT EVENTS: 8/13 Admit, MODS 8/14- comfort approach ish  LINES/TUBES: PIV x 2 ETT 8/13 >> OGT 8/13 >> R IJ HD Cath 8/14 >   DISCUSSION: 61 yo F with PMH ESRD on MWF HD, chronic hypotension, COPD on home O2, CVA, DM, PVD s/p recent L AKA on 8/7 with wound vac presented with altered mental status and hypotension who required intubation for respiratory distress. Previously DNR / DNI.   ASSESSMENT / PLAN:  PULMONARY A: Acute on chronic hypoxic/hypercarbic respiratory failure Pulmonary edema +/- HCAP Hx COPD on home O2, 2L  P:   Increase moprhine for air hunger or rr increase Neg balance as abl eon cvvhd NO repeat abg   CARDIOVASCULAR A:  Shock- septic vs cardiogenic; awaiting echo. Etiology of sepsis unclear at this point. Normal cortisol noted. Troponin bump - presumed demand. Hx CAD, chronic hypotension (on midodrine), LBBB- noted on previous EKG, PVD s/p L AKA with wound vac 8/7 (Wound vac removed 8/13 by ortho). P:  Levo maxed Bp cuff inaccurate? NO a line as family not wish escalation Balance to neg on cvvhd If drops BP further - comfort care  RENAL A:   ESRD- MWF with RUE AVG AGMA/ lactic  acidosis  - resolved Hyponatremia - suspect hypervolemic Mild hyperkalemia P:   cvvhd for now as tolerated by BP  GASTROINTESTINAL A:   GERD NPO Hypoalbuminemia P:   NPO No eating with neurostatus May need NGT and feeds  HEMATOLOGIC A:   Chronic Anemia P:  Hemolysis on draws. May need fem stick  INFECTIOUS A:   R/o HCAP Right wound vac to L AKA - vac removed 8/13 by ortho.  Wound appears clean. P:   Empiric abx likely gram pos rod-  contam diphtheroids likely Maintain empiric abx Follow graft site  ENDOCRINE A:   Hyperglycemia  Hx DM  P:   CBG q 4 SSI sensitive If remains on shock, cortisol repeat  NEUROLOGIC A:   Acute encephalopathy Hx CVA with residual right hemiparesis comfort P:   Morphine  To rr 12-18 or air hunger Low threshold to increase morphine   FAMILY  - Updates:will clarify goals again with husband  - Inter-disciplinary family meet or Palliative Care meeting due by:  04/30/2017  CCT 30 min.    Mcarthur Rossetti. Tyson Alias, MD, FACP Pgr: [phone] [place] [facility] 05/10/2017 10:04 AM

## 2017-05-12 NOTE — Discharge Summary (Signed)
61 year old female with PMH of ESRD (HD MWF), COPD on home ?2L O2 (last seen by Dr. Sherene Sires on 07/2015), HTN, DM, GERD, CAD, CVA s/p right hemiparesis, PVD, otitis media s/p myringotomy tubes 8/7, chronic hypotension (on midodrine on HD days) who presented 8/13 with altered mental status and hypotension.   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  Additionally, there was concern over infected AVG after having a right axilla boil that was positive for Providencia stuartii ad Acintobacter.  She was evaluated by vascular surgery with no evidence of infected AVG and discharged back to SNF.  Of note, patient was a prior DNR.   Presented from [facility] facility after being found altered this morning, calling out for help and found to be hypotensive with EMS as 86/37.  On 8/11, patient had complained of cough and wheezing, with her "asthma acting up" at her Nephrology appointment.   On arrival to ER, SBP 68, temp 99.5, HR 106. Labs noted for WBC 13.9, lactate 3.08, troponin 0.01, K 6.2, Hgb 8.9, Code sepsis initiated.  She was given NS bolus and started on vancomycin and fortaz.  CXR with bilateral pulmonary inflitrates/edema.  In the ER, she became unresponsive requiring intubation.  Placed on phenylephrine and norepinephrine for continued hypotension.  PCCM to admit, nephrology consulting.    Hospital course SIGNIFICANT EVENTS: 8/13 Admit, MODS 8/14- comfort approach ish  Extubated with comfort initially, did breath on own for a time Then pain and comfort  Expired  Final diagnosis upon death  1. Septic shock 2. Recent AKA, r/o wound infection 3. ESRD 4. Acute resp failure 5. Encephalopathy 6. dnr 7. Comfort care  Mcarthur Rossetti. Tyson Alias, MD, FACP Pgr: [phone] [place] [facility]

## 2017-05-12 NOTE — Progress Notes (Addendum)
Death Note: Patient heart rate steadily declined and bradyed into the 20s as levophed was decreased, then turned off per family request; CRRT also stopped at the same time at 19:15 per family request. Patient lost pulse and was asystole by 19:31. No heart sounds or respirations auscultated by Annabell Sabalourtney Wofford, RN and verified by Hermelinda MedicusIrekia Baynes, RN at 19:33. Family at bedside at time of death. CCMD Dr Arsenio LoaderSommer and Nephro MD Dr Briant CedarMattingly notified of patient's expiration. No patient belongings to be sent home with family or to the morgue. CDS called and updated/notified of time of death. Body to be prepared and sent to morgue at this time, and post mortem checklist to be completed there after. 50cc Morphine gtt wasted in sink and witnessed by Annabell Sabalourtney Wofford, RN.

## 2017-05-12 NOTE — Progress Notes (Signed)
CVVHD management.  K sl high yest but hemolyzed.  Labs this AM pending. Keeping even on fluids.  Remains on levophed.  Await this AM labs.  Plan is no reintubation and no escalation of pressors.

## 2017-05-12 NOTE — Progress Notes (Signed)
As discussed with Tyson AliasFeinstein and family Pts levophed titrated down to see how pt tolerates. BP marginal all day. Pts mental status has decreased significantly since around 1630. Pt with "gurgling" sounds with respiration. No distress noted. Morphine drip infusing. Nursing to continue titrating BP meds to off and monitor pt for s/s of resp distress. Per Tyson AliasFeinstein nursing to titrate Levophed to off, Keep pt comfortable. Family at bedside. Agreeable with preceeding with titrating BP meds.

## 2017-05-12 DEATH — deceased

## 2017-05-31 ENCOUNTER — Encounter: Payer: Medicare Other | Admitting: Vascular Surgery

## 2018-11-10 IMAGING — CR DG CHEST 1V PORT
1 series · 1 of 1 positions shown · non-contrast
Comparison: 08/24/2016

CLINICAL DATA: Follow-up pulmonary edema

EXAM:
PORTABLE CHEST 1 VIEW

[AP]
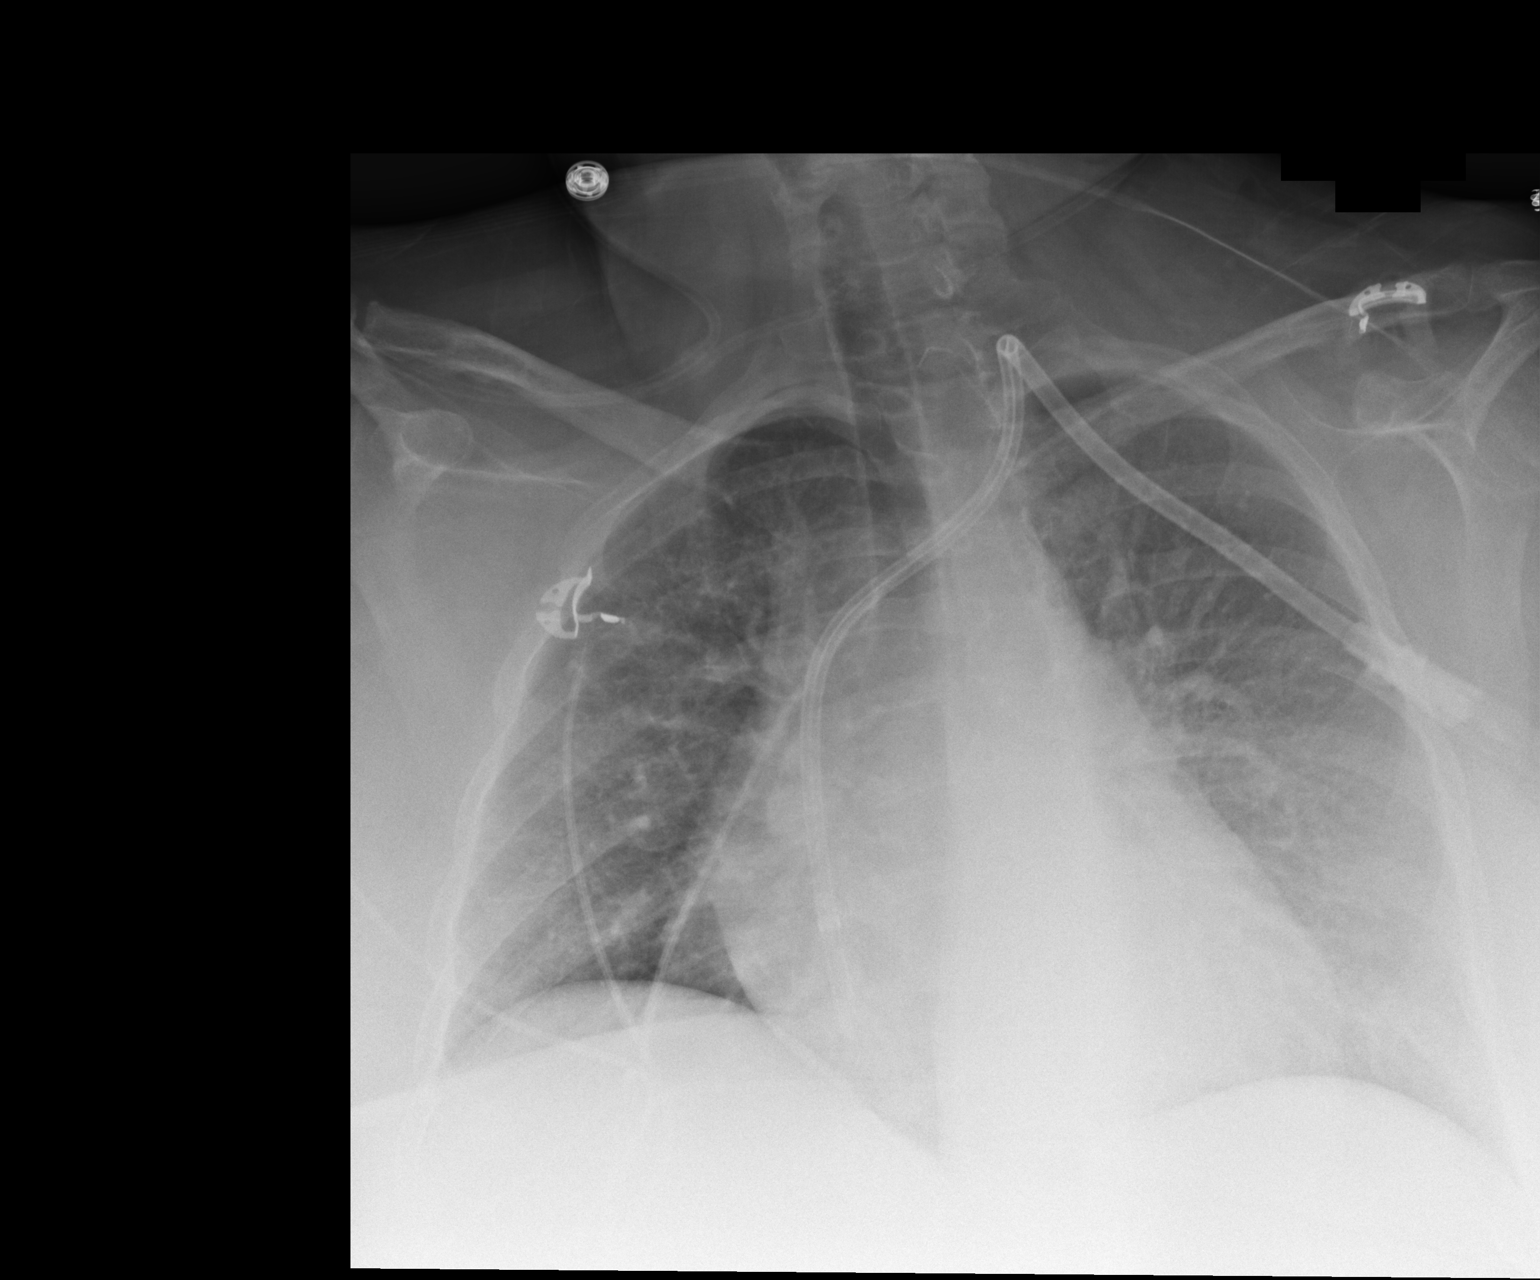

[1 of 1 positions shown; findings below may reference images not displayed]

FINDINGS: Cardiac shadow is mildly prominent and stable. Left jugular dialysis
catheter is again seen. Lungs are clear bilaterally. No vascular
congestion or pulmonary edema is seen. No effusions are noted.
IMPRESSION: No active disease.

## 2018-11-11 IMAGING — CR DG KNEE 1-2V PORT*R*
2 series · 2 of 2 positions shown · non-contrast
Comparison: None.

CLINICAL DATA: Knee pain without trauma

EXAM:
PORTABLE RIGHT KNEE - 1-2 VIEW

[ap]
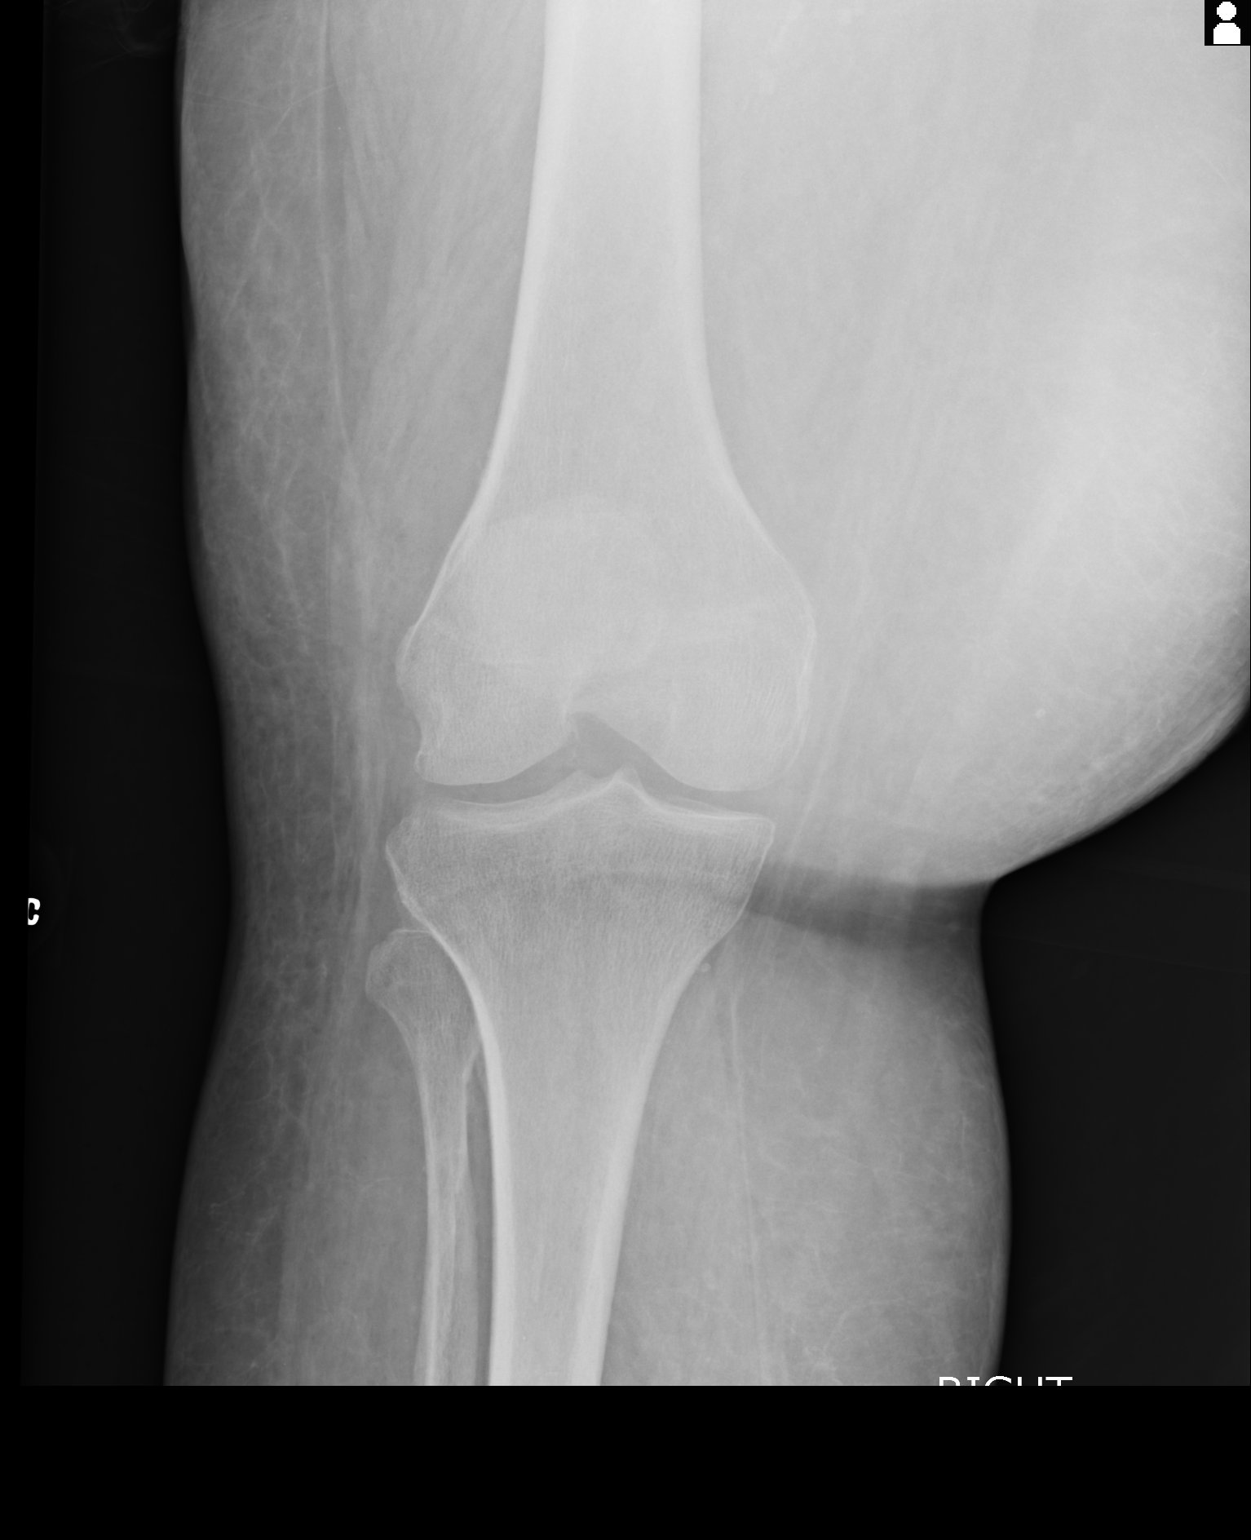

[lat]
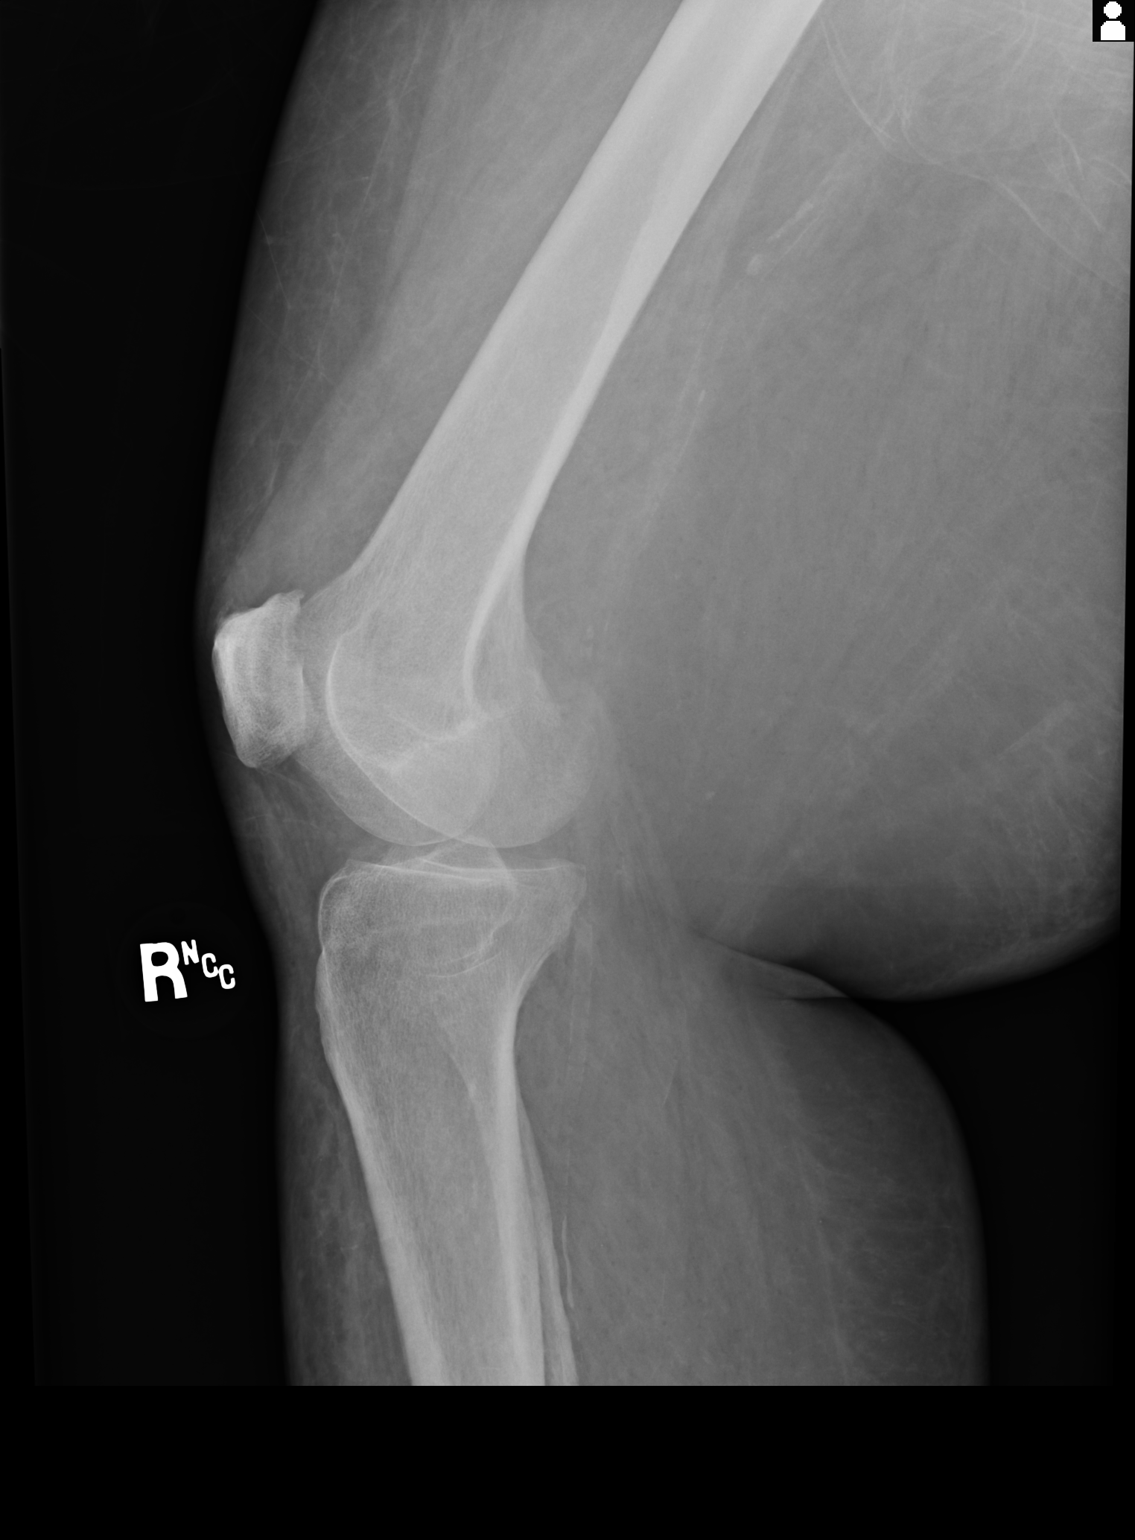

[2 of 2 positions shown; findings below may reference images not displayed]

FINDINGS: Vascular calcifications. Degenerative changes in the patellofemoral
compartment. No fracture, dislocation, or joint effusion.
IMPRESSION: No acute abnormalities.

## 2019-05-02 IMAGING — DX DG CHEST 1V PORT
1 series · 1 of 1 positions shown · non-contrast
Comparison: 09/11/2016

CLINICAL DATA: Chest pain.

EXAM:
PORTABLE CHEST 1 VIEW

[chest]
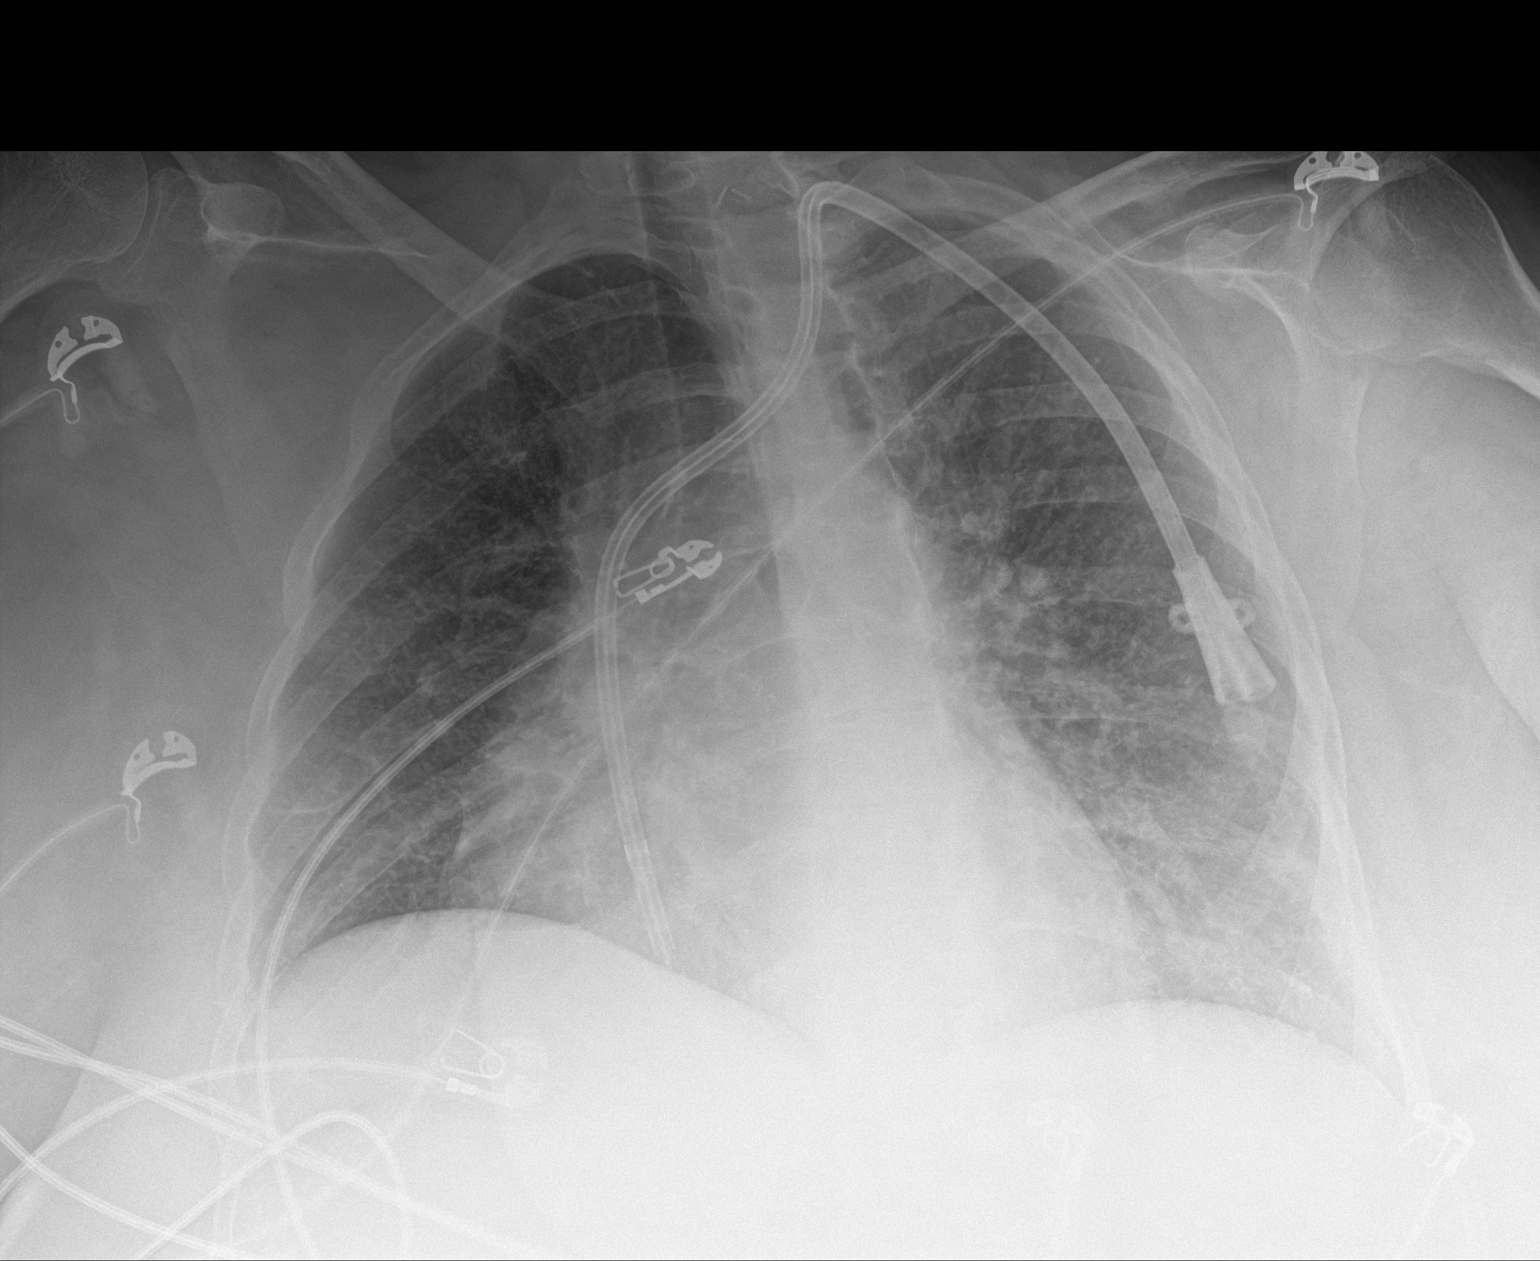

[1 of 1 positions shown; findings below may reference images not displayed]

FINDINGS: Double-lumen dialysis catheter in place, unchanged. There is slight
haziness in the lungs suggesting mild interstitial edema. Pulmonary
vascularity is normal. No effusions. No pneumothorax.
IMPRESSION: Possible mild interstitial edema primarily on the left.

## 2019-06-03 IMAGING — DX DG CHEST 1V PORT
1 series · 1 of 1 positions shown · non-contrast
Comparison: Chest radiograph performed 03/16/2017

CLINICAL DATA: Acute onset of respiratory distress. Initial
encounter.

EXAM:
PORTABLE CHEST 1 VIEW

[chest ap]
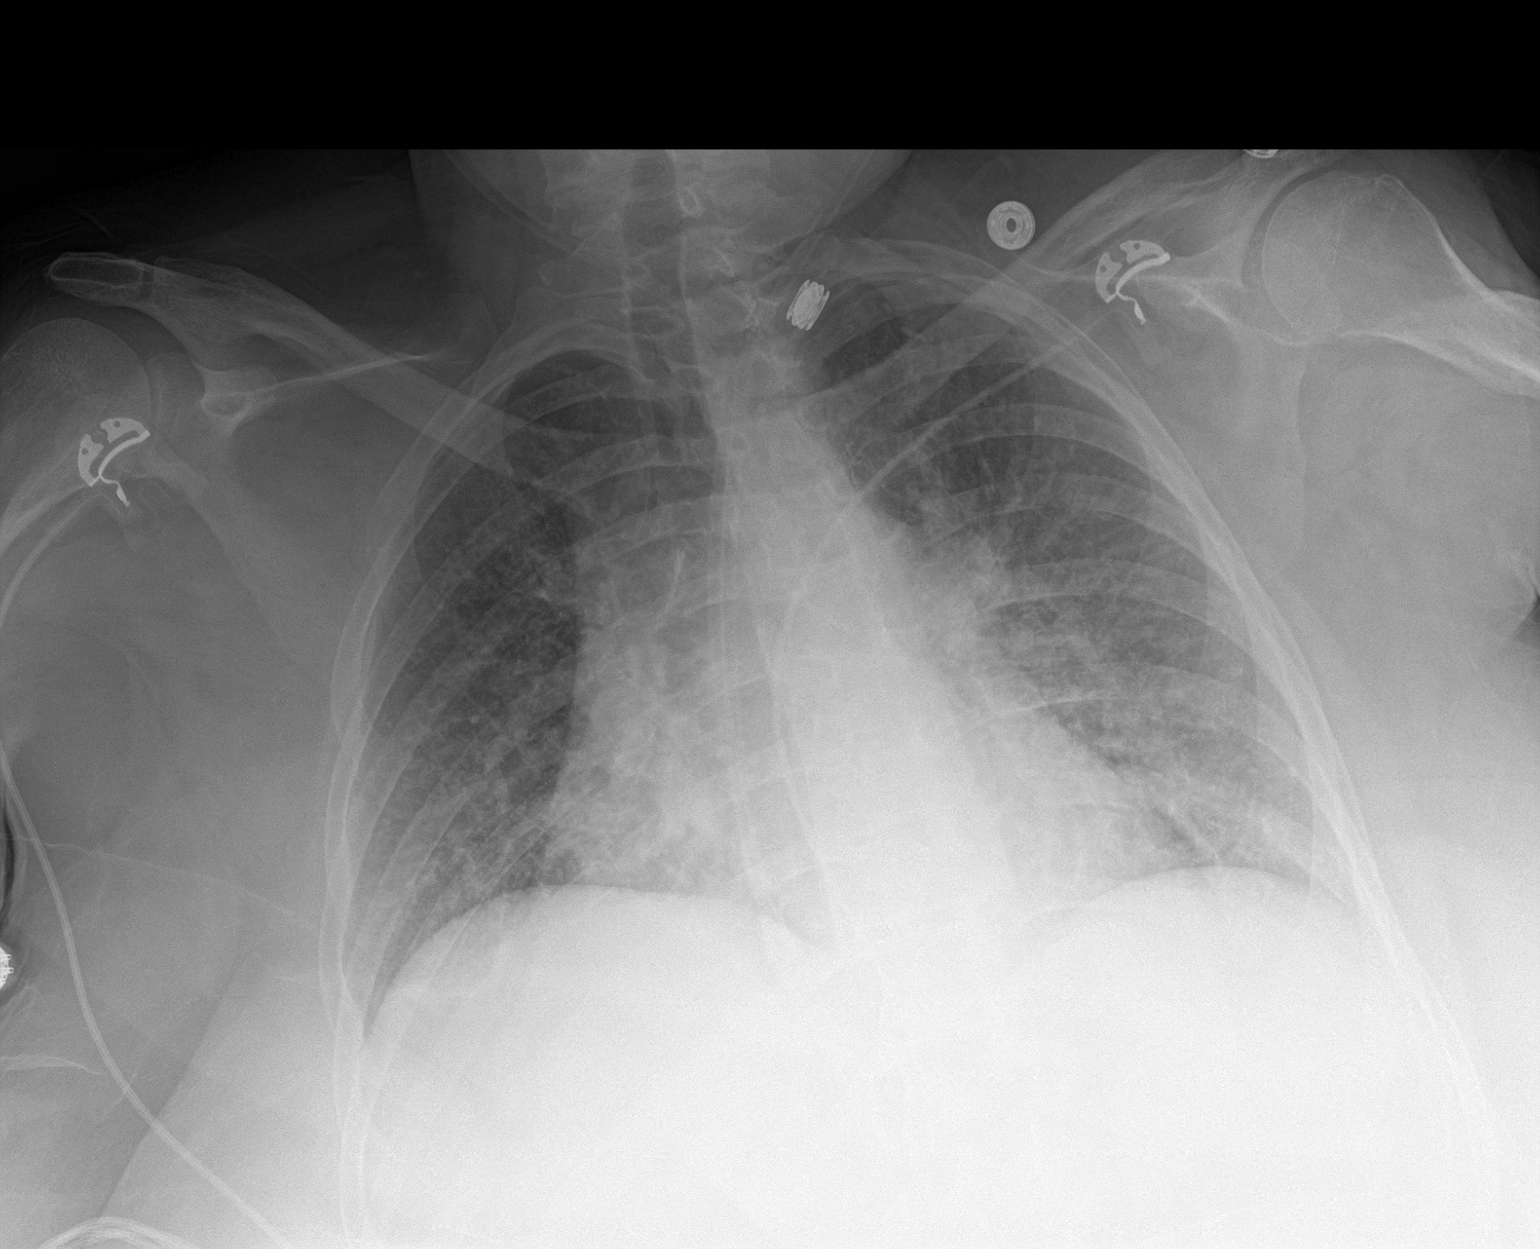

[1 of 1 positions shown; findings below may reference images not displayed]

FINDINGS: The lungs are hypoexpanded. Bilateral central airspace
opacification, worse on the left, may reflect asymmetric pulmonary
edema or possibly pneumonia. No definite pleural effusion or
pneumothorax is seen.

The cardiomediastinal silhouette is borderline enlarged. No acute
osseous abnormalities are identified.
IMPRESSION: Lungs hypoexpanded. Bilateral central airspace opacification, worse
on the left, may reflect asymmetric pulmonary edema or possibly
pneumonia. Borderline cardiomegaly.
